# Patient Record
Sex: Male | Born: 1950
Health system: Southern US, Community
[De-identification: ages and names within clinical notes are randomized; demographics above are authoritative.]

## PROBLEM LIST (undated history)

## (undated) DIAGNOSIS — I639 Cerebral infarction, unspecified: Secondary | ICD-10-CM

## (undated) DIAGNOSIS — D649 Anemia, unspecified: Secondary | ICD-10-CM

## (undated) DIAGNOSIS — IMO0001 Reserved for inherently not codable concepts without codable children: Secondary | ICD-10-CM

## (undated) DIAGNOSIS — Z8679 Personal history of other diseases of the circulatory system: Secondary | ICD-10-CM

## (undated) DIAGNOSIS — Z0389 Encounter for observation for other suspected diseases and conditions ruled out: Secondary | ICD-10-CM

## (undated) DIAGNOSIS — E785 Hyperlipidemia, unspecified: Secondary | ICD-10-CM

## (undated) DIAGNOSIS — Z952 Presence of prosthetic heart valve: Secondary | ICD-10-CM

## (undated) DIAGNOSIS — I10 Essential (primary) hypertension: Secondary | ICD-10-CM

## (undated) DIAGNOSIS — I482 Chronic atrial fibrillation, unspecified: Secondary | ICD-10-CM

## (undated) DIAGNOSIS — M1990 Unspecified osteoarthritis, unspecified site: Secondary | ICD-10-CM

## (undated) DIAGNOSIS — I872 Venous insufficiency (chronic) (peripheral): Secondary | ICD-10-CM

## (undated) DIAGNOSIS — G4730 Sleep apnea, unspecified: Secondary | ICD-10-CM

## (undated) DIAGNOSIS — I219 Acute myocardial infarction, unspecified: Secondary | ICD-10-CM

## (undated) DIAGNOSIS — R7303 Prediabetes: Secondary | ICD-10-CM

## (undated) DIAGNOSIS — I4901 Ventricular fibrillation: Secondary | ICD-10-CM

## (undated) DIAGNOSIS — I5032 Chronic diastolic (congestive) heart failure: Secondary | ICD-10-CM

## (undated) HISTORY — DX: Morbid (severe) obesity due to excess calories: E66.01

## (undated) HISTORY — DX: Reserved for inherently not codable concepts without codable children: IMO0001

## (undated) HISTORY — DX: Essential (primary) hypertension: I10

## (undated) HISTORY — DX: Hyperlipidemia, unspecified: E78.5

## (undated) HISTORY — DX: Sleep apnea, unspecified: G47.30

## (undated) HISTORY — DX: Unspecified osteoarthritis, unspecified site: M19.90

## (undated) HISTORY — DX: Venous insufficiency (chronic) (peripheral): I87.2

## (undated) HISTORY — DX: Acute myocardial infarction, unspecified: I21.9

## (undated) HISTORY — DX: Chronic diastolic (congestive) heart failure: I50.32

## (undated) HISTORY — PX: TOTAL HIP ARTHROPLASTY: SHX124

## (undated) HISTORY — DX: Encounter for observation for other suspected diseases and conditions ruled out: Z03.89

## (undated) HISTORY — DX: Chronic atrial fibrillation, unspecified: I48.20

---

## 2001-03-07 ENCOUNTER — Encounter (HOSPITAL_COMMUNITY): Admission: RE | Admit: 2001-03-07 | Discharge: 2001-04-06 | Payer: Self-pay | Admitting: Internal Medicine

## 2003-08-30 ENCOUNTER — Ambulatory Visit (HOSPITAL_COMMUNITY): Admission: RE | Admit: 2003-08-30 | Discharge: 2003-08-30 | Payer: Self-pay | Admitting: Family Medicine

## 2003-08-30 ENCOUNTER — Encounter: Payer: Self-pay | Admitting: Family Medicine

## 2004-05-01 ENCOUNTER — Ambulatory Visit (HOSPITAL_COMMUNITY): Admission: RE | Admit: 2004-05-01 | Discharge: 2004-05-01 | Payer: Self-pay | Admitting: Internal Medicine

## 2004-07-09 ENCOUNTER — Encounter (HOSPITAL_COMMUNITY): Admission: RE | Admit: 2004-07-09 | Discharge: 2004-08-08 | Payer: Self-pay | Admitting: Internal Medicine

## 2004-08-10 ENCOUNTER — Encounter (HOSPITAL_COMMUNITY): Admission: RE | Admit: 2004-08-10 | Discharge: 2004-08-21 | Payer: Self-pay | Admitting: Orthopedic Surgery

## 2004-08-24 ENCOUNTER — Encounter (HOSPITAL_COMMUNITY): Admission: RE | Admit: 2004-08-24 | Discharge: 2004-09-23 | Payer: Self-pay | Admitting: Orthopedic Surgery

## 2004-11-04 ENCOUNTER — Ambulatory Visit: Payer: Self-pay | Admitting: Orthopedic Surgery

## 2005-03-12 ENCOUNTER — Ambulatory Visit (HOSPITAL_COMMUNITY): Admission: RE | Admit: 2005-03-12 | Discharge: 2005-03-12 | Payer: Self-pay | Admitting: Internal Medicine

## 2005-05-12 ENCOUNTER — Ambulatory Visit: Payer: Self-pay | Admitting: Orthopedic Surgery

## 2005-08-24 ENCOUNTER — Ambulatory Visit (HOSPITAL_COMMUNITY): Admission: RE | Admit: 2005-08-24 | Discharge: 2005-08-24 | Payer: Self-pay | Admitting: Internal Medicine

## 2005-09-02 ENCOUNTER — Ambulatory Visit: Payer: Self-pay | Admitting: Internal Medicine

## 2006-02-07 ENCOUNTER — Ambulatory Visit: Payer: Self-pay | Admitting: Orthopedic Surgery

## 2006-03-08 ENCOUNTER — Ambulatory Visit (HOSPITAL_COMMUNITY): Admission: RE | Admit: 2006-03-08 | Discharge: 2006-03-08 | Payer: Self-pay | Admitting: Family Medicine

## 2006-03-12 ENCOUNTER — Inpatient Hospital Stay (HOSPITAL_COMMUNITY): Admission: EM | Admit: 2006-03-12 | Discharge: 2006-03-20 | Payer: Self-pay | Admitting: Emergency Medicine

## 2006-03-12 ENCOUNTER — Encounter: Payer: Self-pay | Admitting: Emergency Medicine

## 2006-03-15 ENCOUNTER — Encounter (INDEPENDENT_AMBULATORY_CARE_PROVIDER_SITE_OTHER): Payer: Self-pay | Admitting: Cardiovascular Disease

## 2006-04-07 ENCOUNTER — Ambulatory Visit: Payer: Self-pay | Admitting: Orthopedic Surgery

## 2006-07-04 ENCOUNTER — Ambulatory Visit: Payer: Self-pay | Admitting: Orthopedic Surgery

## 2006-08-30 ENCOUNTER — Inpatient Hospital Stay (HOSPITAL_COMMUNITY): Admission: AD | Admit: 2006-08-30 | Discharge: 2006-09-05 | Payer: Self-pay | Admitting: Cardiovascular Disease

## 2006-08-31 HISTORY — PX: JOINT REPLACEMENT: SHX530

## 2008-03-14 ENCOUNTER — Ambulatory Visit (HOSPITAL_COMMUNITY): Admission: RE | Admit: 2008-03-14 | Discharge: 2008-03-14 | Payer: Self-pay | Admitting: Family Medicine

## 2008-06-13 ENCOUNTER — Telehealth (INDEPENDENT_AMBULATORY_CARE_PROVIDER_SITE_OTHER): Payer: Self-pay | Admitting: *Deleted

## 2008-06-18 DIAGNOSIS — G4733 Obstructive sleep apnea (adult) (pediatric): Secondary | ICD-10-CM

## 2008-06-19 ENCOUNTER — Ambulatory Visit: Payer: Self-pay | Admitting: Internal Medicine

## 2008-06-25 DIAGNOSIS — I214 Non-ST elevation (NSTEMI) myocardial infarction: Secondary | ICD-10-CM

## 2008-09-22 HISTORY — PX: GASTRIC BYPASS: SHX52

## 2011-04-09 NOTE — Op Note (Signed)
Joel Rogers, Joel Rogers               ACCOUNT NO.:  [id number]   MEDICAL RECORD NO.:  [id number]          PATIENT TYPE:  INP   LOCATION:  2004                         FACILITY:  [facility]   PHYSICIAN:  Ollen Gross, M.D.    DATE OF BIRTH:  03/18/1951   DATE OF PROCEDURE:  08/31/2006  DATE OF DISCHARGE:                                 OPERATIVE REPORT   PREOP DIAGNOSIS:  Osteoarthritis, right hip.   POSTOPERATIVE DIAGNOSIS:  Osteoarthritis, right hip.   PROCEDURE:  Right total hip arthroplasty.   SURGEON:  Ollen Gross, M.D.   ASSISTANT:  Avel Peace.   ANESTHESIA:  General.   ESTIMATED BLOOD LOSS:  300.   DRAINS:  None.   COMPLICATIONS:  None.   CONDITION:  Stable to recovery room.   CLINICAL NOTE:  Joel Rogers is a 60 year old male with severe end-stage arthritis  of the hip and severe morbid obesity.  He presents now for right total hip  arthroplasty.   PROCEDURE IN DETAIL:  After the successful administration of a general  anesthetic, the patient was placed in the left lateral decubitus position  with the right side up and held with the hip positioner.  The right lower  extremity from his perineum with plastic drapes and prepped and draped in  the usual sterile fashion.  Standard posterolateral incision was made with a  10 blade through subcutaneous tissue to the level of the fascia lata which  is incised in line with the skin incision.  The sciatic nerve was palpated  and protected and the short external rotators isolated off the femur.  A  capsulectomy is performed and the hip is dislocated.  The center of the  femoral head is marked and a trial prosthesis placed such that the center of  the trial head corresponds to the center of his native femoral head.  An  osteotomy line was marked on the femoral neck and osteotomy made with an  oscillating saw.  The femoral head is removed and the femur retracted  anteriorly to gain acetabular exposure.   Acetabular retractors were  placed and the labrum and osteophytes removed.  Reaming starts at 45 mm coursing in increments of 2 up to 55 mm and then a  56 mm pinnacle acetabular shell was placed in anatomic position and  transfixed with two dome screws.  The apex hole eliminator is placed and the  40 mm neutral Ultramet liner is placed.  This is a metal-on-metal hip  replacement.   On the femoral side the femoral preparation is initiated with the canal  finder and irrigation.  Axial reaming is performed up to 13.5 mm proximal  reaming to an 18 F and the sleeve machined to a large.  An 53 F large trial  sleeve is placed with an 18 x 13 stem and a 36 +8 neck.  We went 10 degrees  beyond his native anteversion.  A 40 +0 trial head is placed and the hip is  reduced with great stability.  There was full extension, full external  rotation 70 degrees flexion, 40 degrees adduction and 90  degrees internal  rotation and 90 degrees of flexion and 70 degrees internal rotation.  By  placing the right leg on top of the left it felt as though leg lengths were  equal.  Please note that the trial was an 18 x 13 36 +12 neck.  The femoral  trials were removed and then the permanent 18 F large sleeve is placed and  an 18 x 13 stem and a 36 +8 neck again about 10 degrees beyond his native  anteversion.  The 40 +0 head is placed and the hip was reduced with the same  stability parameters.   The wound is then copiously irrigated with saline solution and the short  rotators reattached to the femur through drill holes.  The fascia lata was  closed over a Hemovac drain with interrupted #1 Vicryl.  Subcu is then  closed with #1 in multiple layers and 2-0 Vicryl.  Subcuticular was closed  with a running 4-0 Monocryl.  The incision is cleaned and dried and Steri-  Strips and a bulky sterile dressing applied.  He is then placed into a knee  immobilizer, awakened and transferred to recovery in stable condition.      Ollen Gross, M.D.   Electronically Signed     FA/MEDQ  D:  08/31/2006  T:  09/02/2006  Job:  [id number]

## 2011-04-09 NOTE — Cardiovascular Report (Signed)
NAMEREINER, LOEWEN NO.:  [id number]   MEDICAL RECORD NO.:  [id number]           PATIENT TYPE:   LOCATION:                                 FACILITY:   PHYSICIAN:  Madaline Savage, M.D.     DATE OF BIRTH:   DATE OF PROCEDURE:  03/14/2006  DATE OF DISCHARGE:                              CARDIAC CATHETERIZATION   PROCEDURES PERFORMED:  1.  Selective coronary angiography by Judkins technique.  2.  Retrograde left heart catheterization.  3.  Left ventricular angiography.  4.  Right percutaneous femoral AngioSeal closure of the right femoral      artery.   COMPLICATIONS:  None.   ENTRY SITE:  Right femoral.   DYE USED:  Omnipaque.   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  In the [facility] Emergency Room he was noted to be in atrial fibrillation.  He  reports that he has lost 40 pounds of weight since December of 2006.  During  his hospitalization here at [facility] he has had positive cardiac  enzymes showing a CK peak of 309, a CK-MB peak of 18.4, a troponin peak of  3.98.  He is known to have hypertension, hyperlipidemia, obstructive sleep  apnea, and morbid obesity.  He has had a history of bradycardias at night.  Today he enters the catheterization laboratory for further evaluation of his  chest tightness and of his abnormal cardiac enzymes.  This case was  completed without any complications from the right percutaneous femoral  approach.   RESULTS:   PRESSURES:  Central aortic pressure 125/90, mean of 110.  LV pressure  130/14, end-diastolic pressure 25.  No significant gradient by pullback  technique.   ANGIOGRAPHIC RESULTS:  The patient did not show any evidence of valvular,  pericardial, or coronary calcifications during the test.   Coronary angiography was  normal except for some mild luminal irregularities  in one obtuse marginal branch well below 30%.  Circumflex and right coronary  arteries were codominant.  LV EF was estimated at 80%.  There was  obliteration of the apical LV due to marked hypertrophy of the papillary  muscles.  There was no significant regurgitation or LV thrombus seen, also  no prolapse.   FINAL DIAGNOSIS:  1.  Angiographically patent coronary arteries with a codominant system.  2.  Supernormal left ventricular systolic function, ejection fraction 80%      with in-cavity obliteration of the left ventricular at end-systole.  3.  No evidence of aortic valve gradient.  4.  Successful right percutaneous femoral artery AngioSeal.   PLAN:  The patient needs some medication changes and those will be  initiated.  He will be a candidate for discharge after he becomes  coumadinized for his atrial fibrillation.           ______________________________  Madaline Savage, M.D.     WHG/MEDQ  D:  03/14/2006  T:  03/15/2006  Job:  [id number]   cc:   Darlin Priestly, MD  Fax: [phone]

## 2011-04-09 NOTE — Discharge Summary (Signed)
Joel Rogers, Joel Rogers               ACCOUNT NO.:  [id number]   MEDICAL RECORD NO.:  [id number]          PATIENT TYPE:  INP   LOCATION:  5038                         FACILITY:  [facility]   PHYSICIAN:  Alexzandrew L. Perkins, P.A.DATE OF BIRTH:  Apr 14, 1951   DATE OF ADMISSION:  08/30/2006  DATE OF DISCHARGE:  09/05/2006                                 DISCHARGE SUMMARY   ADMISSION DIAGNOSIS:  1. Osteoarthritis right hip.  2. Atrial fibrillation.  3. Obesity.  4. Sleep apnea.  5. Hypertension.  6. Dyslipidemia.   DISCHARGE DIAGNOSIS:  1. Osteoarthritis right hip status post right total hip arthroplasty.  2. Postoperative hypokalemia, improved.  Remaining discharge diagnoses, same as admitting diagnoses.   PROCEDURE:  August 31, 2006 right total hip.  Surgeon Dr. Lequita Halt,  assistant Alexzandrew Julien Girt [facility], anesthesia general.  Consult:  [facility], Dr. Allyson Sabal.   BRIEF HISTORY:  Joel Rogers is a 60 year old male with severe end stage arthritis  of the hip, severe morbid obesity, now presents for total arthroplasty.   LABORATORY DATA:  CBC on admission:  Hemoglobin 13.3, hematocrit 39.5, white  cell count 8.8, serial CBC's were followed.  Hemoglobin did drop down to  12.3 post op, back last noted h and h was 12.4 and 36.2.  PT/INR on  admission:  Pro-time elevated at 17, PTT of 31, INR of 1.3, a Heparin level  of 0.08.  Follow up PT/INR pre op:  15.4 and 1.2.  Patient was placed on  Coumadin post op.  Serial pro-time INR's are followed.  Last noted PT/INR  23.2 and 2.0.  Chem panel on admission all in the normal limits.  Serial B-  met's were followed.  Potassium did drop from 4.3 down to 3.4, back to 3.7.  Pre op UA negative, with the exception of positive protein and rare  bacteria.  Blood grew type O+.   X-RAYS:  1. Portable chest August 30, 2006:  Cardiomegaly, chronic pulmonary      vascular congestion without significant change, no acute findings.  2. Hip films  on August 30, 2006:  Advanced right hip degenerative joint      disease.  3. Portable pelvis and hip August 31, 2006:  Status post right hip      replacement.  Patient's pre op EKG:  Irregular rhythm, no P-wave, S-T and T-wave  abnormality consistent with anterior septal infarct, probably old.  Outpatient echo:  Dated March 15, 2006, left ventricular size was in the  upper limits of normal, overall left ventricular systolic function was  normal, EF estimated at 55%, no diagnostic evidence of left ventricular  regional wall motion abnormalities, mild mitral anuric complication, left  atrium was moderately dilated, right atrium was mildly dilated.   HOSPITAL COURSE:  The patient was admitted to [facility] by Dr.  Nanetta Batty in cardiac services due to need for Heparinization.  Was  admitted on August 30, 2006 and started on Heparin after being off Coumadin  for his cardiac history.  The Heparin was continued until the following  morning.  The Heparin was stopped.  The patient was pre op NPO and taken to  the OR on the following day of August 31, 2006.  Patient was started on  Lovenox and Coumadin postoperatively.  Tolerated procedure well.  Was kept  on telemetry floor for postoperative monitoring.  On the morning of day #1,  he was doing very well, minimal pain, Hemovac drain, which was placed at  time of surgery, was pulled.  Hemoglobin was 13.8.  Physical therapy was  consulted.  [facility] followed along during the  hospital course.  Norvasc was changed over to Cardizem for better rate  control on day #2.  He was under better pain control.  He had been weaned  over to p.o. meds.  Dressing was changed on day #2 and the incision looked  good.  He started getting up with a little bit of therapy, was not moving  around very much so we left the Foley in another day.  From a cardiac  standpoint, he was doing very well, was on Lovenox bridging until  Coumadin  was therapeutic.  His INR was slowly coming up and improving.  It was felt  once he was stable from an orthopedic standpoint, he would be ready to go  home.  Did run a little bit of low grade temp around day 3, day 4, and was  treated with antipyretics and incentive spirometer.  Minimal pain,  tolerating well.  Continued to progress with physical therapy and by September 05, 2006 his pain had greatly improved.  INR was back up to 2.0, stayed on  his Coumadin, Lovenox was discontinued, progressing well.  We allowed to be  weight bearing as tolerated for better mobilization and he was discharged  home.   DISCHARGE PLAN:  1. Patient discharged home on September 05, 2006.  2. Discharge diagnosis, please see above.  3. Discharge meds:  Coumadin, Percocet, Robaxin.  4. Diet:  Resume previous home diet.  5. Activity:  He is weight bearing as tolerated.  Gait training,      ambulation, ADLs as per PT, RN for Coumadin blood draws and Coumadin      protocol.  6. Followup 2 weeks from surgery.   DISPOSITION:  Home.   CONDITION ON DISCHARGE:  Improved.      Alexzandrew L. Julien Girt, P.A.     ALP/MEDQ  D:  09/22/2006  T:  09/23/2006  Job:  [id number]   cc:   Nanetta Batty, M.D.  Ollen Gross, M.D.  Madelin Rear. Sherwood Gambler, MD

## 2011-04-09 NOTE — Procedures (Signed)
Joel Rogers, Joel Rogers               ACCOUNT NO.:  [id number]   MEDICAL RECORD NO.:  [id number]          PATIENT TYPE:  OUT   LOCATION:                                FACILITY:  [facility]   PHYSICIAN:  Edward L. Juanetta Gosling, M.D.DATE OF BIRTH:  Feb 26, 1951   DATE OF PROCEDURE:  DATE OF DISCHARGE:                                EKG INTERPRETATION   They rhythm is atrial fibrillation with a well-controlled ventricular  response between 80 and 90.  ST-T wave abnormalities are seen which could  indicated ischemia, and clinical correlation is suggested.  Abnormal  electrocardiogram.      Oneal Deputy. Juanetta Gosling, M.D.  Electronically Signed     ELH/MEDQ  D:  03/13/2006  T:  03/15/2006  Job:  [id number]

## 2011-04-09 NOTE — Discharge Summary (Signed)
Joel Rogers, Joel Rogers               ACCOUNT NO.:  [id number]   MEDICAL RECORD NO.:  [id number]          PATIENT TYPE:  INP   LOCATION:  3736                         FACILITY:  [facility]   PHYSICIAN:  Darlin Priestly, MD  DATE OF BIRTH:  [birth year]   DATE OF ADMISSION:  2020-04-1906  DATE OF DISCHARGE:  03/20/2006                                 DISCHARGE SUMMARY   DISCHARGE DIAGNOSES:  1.  Subendocardial myocardial infarction, suspected coronary spasm with      normal coronaries at catheterization.  2.  Atrial fibrillation, new, controlled rate at discharge.  3.  Morbid obesity.  4.  Sleep apnea.  5.  History of hypertension.  6.  History of dyslipidemia.   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  He is  followed by Dr. Sherwood Gambler.  He has recently been on an exercise program doing  some water aerobics for weight loss.  He presented March 12, 2006, with  chest tightness followed by severe weakness and diaphoresis.  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  There were some nurses present who sent  him to the ER at [facility].  He was found to be in atrial fib.  He  was transferred to [facility] for further evaluation.  He was put on IV heparin.  Subsequent enzymes were positive with a CK peak  of 309 and 18 MBs.  He underwent diagnostic catheterization March 14, 2006,  which surprisingly showed normal coronaries with an EF of 80%.  Suspected he  may have had coronary spasm, although interestingly he was on Norvasc 10 mg  a day prior to admission.  Echocardiogram was done on March 15, 2006.  This  revealed normal LV function.  The left atrium was moderately dilated.  The  right atrium was mildly dilated.  We were going to send him home on Lovenox  and Coumadin crossover, but the patient has some financial issues and could  not afford Lovenox and apparently does  not have insurance that will cover  it.  We kept him in the hospital until his INR was 2.2 on the March 20, 2006.   He will be discharged on the following medications.  1.  Lipitor 40 mg a day.  2.  Torsemide 20 mg a day.  3.  Aspirin 81 mg a day.  4.  Avapro 150 mg a day.  5.  Metoprolol 50 mg twice a day.  6.  Coumadin 10 mg a day or as directed.  7.  Norvasc 2.5 mg once a day.  8.  Nitroglycerin sublingual p.r.n.   LABS AT DISCHARGE:  His INR is 2.2.  White count 11.2, hemoglobin 15.4,  hematocrit 45.2, platelets 263.  Most recent renal function from the March 15, 2006, was sodium 140, potassium 3.5, BUN 7, creatinine 1.1.  CKs peaked  as noted 309 with 18 MBs.  Hemoglobin A1c was 6.  Lipid study shows  cholesterol 179, LDL 108.  TSH  0.80.   Chest x-ray shows cardiomegaly and chronic lung markings.   EKG shows atrial fibrillation with controlled ventricular response and T-  wave inversion diffusely.   DISPOSITION:  The patient is discharged stable in condition.  He will follow-  up with Dr. Allyson Sabal as an outpatient.  We did cut his Norvasc back because of  hypotension.  Dr. Jacinto Halim feels he can be cardioverted in two weeks as an  outpatient and not require TEE if he has been adequately anticoagulated.  He  will have a protime Wednesday next week.      Abelino Derrick, P.A.      Darlin Priestly, MD  Electronically Signed    LKK/MEDQ  D:  03/20/2006  T:  03/21/2006  Job:  [id number]

## 2012-02-07 ENCOUNTER — Other Ambulatory Visit (HOSPITAL_COMMUNITY): Payer: Self-pay | Admitting: Orthopedic Surgery

## 2012-02-07 DIAGNOSIS — M25559 Pain in unspecified hip: Secondary | ICD-10-CM

## 2012-02-09 ENCOUNTER — Ambulatory Visit (HOSPITAL_COMMUNITY)
Admission: RE | Admit: 2012-02-09 | Discharge: 2012-02-09 | Disposition: A | Discharge status: Home / Self Care | Payer: Medicare Other | Source: Ambulatory Visit | Attending: Orthopedic Surgery | Admitting: Orthopedic Surgery

## 2012-02-09 DIAGNOSIS — M79609 Pain in unspecified limb: Secondary | ICD-10-CM | POA: Insufficient documentation

## 2012-02-09 DIAGNOSIS — M25559 Pain in unspecified hip: Secondary | ICD-10-CM | POA: Insufficient documentation

## 2012-02-09 DIAGNOSIS — Z96649 Presence of unspecified artificial hip joint: Secondary | ICD-10-CM | POA: Insufficient documentation

## 2012-05-23 ENCOUNTER — Emergency Department (HOSPITAL_COMMUNITY): Payer: Medicare Other

## 2012-05-23 ENCOUNTER — Emergency Department (HOSPITAL_COMMUNITY)
Admission: EM | Admit: 2012-05-23 | Discharge: 2012-05-23 | Disposition: A | Discharge status: Home / Self Care | Payer: Medicare Other | Attending: Emergency Medicine | Admitting: Emergency Medicine

## 2012-05-23 ENCOUNTER — Encounter (HOSPITAL_COMMUNITY): Payer: Self-pay | Admitting: *Deleted

## 2012-05-23 DIAGNOSIS — R110 Nausea: Secondary | ICD-10-CM | POA: Insufficient documentation

## 2012-05-23 DIAGNOSIS — I4891 Unspecified atrial fibrillation: Secondary | ICD-10-CM | POA: Insufficient documentation

## 2012-05-23 DIAGNOSIS — Z79899 Other long term (current) drug therapy: Secondary | ICD-10-CM | POA: Insufficient documentation

## 2012-05-23 DIAGNOSIS — R109 Unspecified abdominal pain: Secondary | ICD-10-CM | POA: Insufficient documentation

## 2012-05-23 DIAGNOSIS — Z7901 Long term (current) use of anticoagulants: Secondary | ICD-10-CM | POA: Insufficient documentation

## 2012-05-23 LAB — COMPREHENSIVE METABOLIC PANEL
ALT: 31 U/L (ref 0–53)
AST: 43 U/L — ABNORMAL HIGH (ref 0–37)
Albumin: 3.7 g/dL (ref 3.5–5.2)
Alkaline Phosphatase: 95 U/L (ref 39–117)
GFR calc Af Amer: >90 mL/min (ref >90)
Glucose, Bld: 124 mg/dL — ABNORMAL HIGH (ref 70–99)
Potassium: 3.4 mEq/L — ABNORMAL LOW (ref 3.5–5.1)
Sodium: 136 mEq/L (ref 135–145)
Total Protein: 7.1 g/dL (ref 6.0–8.3)

## 2012-05-23 LAB — URINALYSIS, ROUTINE W REFLEX MICROSCOPIC
Bilirubin Urine: NEGATIVE
Glucose, UA: NEGATIVE mg/dL
Ketones, ur: NEGATIVE mg/dL
pH: 6 (ref 5.0–8.0)

## 2012-05-23 LAB — CBC WITH DIFFERENTIAL/PLATELET
Eosinophils Absolute: 0.1 10*3/uL (ref 0.0–0.7)
Lymphs Abs: 1.8 10*3/uL (ref 0.7–4.0)
MCH: 28.3 pg (ref 26.0–34.0)
Neutrophils Relative %: 79 % — ABNORMAL HIGH (ref 43–77)
Platelets: 211 10*3/uL (ref 150–400)
RBC: 5.13 MIL/uL (ref 4.22–5.81)
WBC: 13.1 10*3/uL — ABNORMAL HIGH (ref 4.0–10.5)

## 2012-05-23 LAB — URINE MICROSCOPIC-ADD ON

## 2012-05-23 MED ORDER — HYOSCYAMINE SULFATE 0.125 MG PO TABS
0.1250 mg | ORAL_TABLET | Freq: Once | ORAL | Status: AC
  Administered 2012-05-23: 0.125 mg via ORAL
  Filled 2012-05-23: qty 1

## 2012-05-23 MED ORDER — GI COCKTAIL ~~LOC~~
30.0000 mL | Freq: Once | ORAL | Status: AC
  Administered 2012-05-23: 30 mL via ORAL
  Filled 2012-05-23: qty 30

## 2012-05-23 MED ORDER — HYOSCYAMINE SULFATE 0.125 MG SL SUBL
SUBLINGUAL_TABLET | SUBLINGUAL | Status: AC
  Filled 2012-05-23: qty 1

## 2012-05-23 NOTE — ED Notes (Signed)
Taking sips of water w/out N/V or abd. Distress.

## 2012-05-23 NOTE — ED Notes (Signed)
Pt states abdominal pain after eating peppers. Denies vomiting. Last BM was at 1630.

## 2012-05-23 NOTE — ED Provider Notes (Signed)
History     CSN: [id number]  Arrival date & time 05/23/12  1843   First MD Initiated Contact with Patient 05/23/12 1905      Chief Complaint  Patient presents with  . Abdominal Pain    (Consider location/radiation/quality/duration/timing/severity/associated sxs/prior treatment) HPI Comments: Joel Rogers is a 61 y.o. Male who ate salad 2:30 PM today, then, at 4:30 PM, developed lower abdominal pain, followed by needing to have a bowel movement. The bowel movement was semi-formed. It was brown in color, without bleeding,  He had nausea without vomiting. He denies preceding illness, such as fever, chills, cough, shortness of breath, chest or back pain. The changes in his urinary habits. He does not have ongoing, bowel abnormalities. He typically gets similar pain when he eats hot peppers, and they were in his salad today. He does not know of any palliative factors. He has not tried to take anything for the problem.  Patient is a 61 y.o. male presenting with abdominal pain. The history is provided by the patient.  Abdominal Pain The primary symptoms of the illness include abdominal pain.    Past Medical History  Diagnosis Date  . Atrial fibrillation     Past Surgical History  Procedure Date  . Gastric bypass     No family history on file.  History  Substance Use Topics  . Smoking status: Never Smoker   . Smokeless tobacco: Not on file  . Alcohol Use: No      Review of Systems  Gastrointestinal: Positive for abdominal pain.  All other systems reviewed and are negative.    Allergies  Review of patient's allergies indicates no known allergies.  Home Medications   Current Outpatient Rx  Name Route Sig Dispense Refill  . AMLODIPINE BESYLATE 10 MG PO TABS Oral Take 10 mg by mouth daily.    . ATORVASTATIN CALCIUM 80 MG PO TABS Oral Take 80 mg by mouth at bedtime.    Marland Kitchen CALCIUM + D PO Oral Take 1 tablet by mouth daily.    Marland Kitchen LOSARTAN POTASSIUM 50 MG PO TABS Oral Take  50 mg by mouth daily.    Marland Kitchen METOPROLOL TARTRATE 100 MG PO TABS Oral Take 100 mg by mouth 2 (two) times daily.    . TORSEMIDE 20 MG PO TABS Oral Take 20 mg by mouth at bedtime.     . WARFARIN SODIUM 5 MG PO TABS Oral Take 5 mg by mouth every evening.       BP 136/77  Pulse 79  Temp 98.9 F (37.2 C) (Oral)  Resp 20  Ht 6' (1.829 m)  Wt 277 lb (125.646 kg)  BMI 37.57 kg/m2  SpO2 95%  Physical Exam  Nursing note and vitals reviewed. Constitutional: He is oriented to person, place, and time. He appears well-developed and well-nourished.  HENT:  Head: Normocephalic and atraumatic.  Right Ear: External ear normal.  Left Ear: External ear normal.  Eyes: Conjunctivae and EOM are normal. Pupils are equal, round, and reactive to light.  Neck: Normal range of motion and phonation normal. Neck supple.  Cardiovascular: Normal rate, regular rhythm, normal heart sounds and intact distal pulses.   Pulmonary/Chest: Effort normal and breath sounds normal. He exhibits no bony tenderness.  Abdominal: Soft. Normal appearance. He exhibits no distension. There is tenderness (mild bilateral lower quadrants).       Hypoactive bowel sounds.  Genitourinary:       Normal anal sphincter tone. Brown stool in rectum, and is  Hemoccult negative; quality control  testing is normal  Musculoskeletal: Normal range of motion.  Neurological: He is alert and oriented to person, place, and time. He has normal strength. No cranial nerve deficit or sensory deficit. He exhibits normal muscle tone. Coordination normal.  Skin: Skin is warm, dry and intact.  Psychiatric: He has a normal mood and affect. His behavior is normal. Judgment and thought content normal.    ED Course  Procedures (including critical care time) Emergency department treatment: GI cocktail, and Levsin.  Reevaluation: 21:35- he feels better now, is passing gas, and states that he has just a vague soreness in his body that he feels is related to working  out with weights yesterday.  Oral fluid and trial: Tolerating oral fluid (22:00)  Repeat vital signs are normal (22:06)  Tylenol given prior to discharge for abdominal pain (3/10)  Labs Reviewed  CBC WITH DIFFERENTIAL - Abnormal; Notable for the following:    WBC 13.1 (*)     Neutrophils Relative 79 (*)     Neutro Abs 10.4 (*)     All other components within normal limits  COMPREHENSIVE METABOLIC PANEL - Abnormal; Notable for the following:    Potassium 3.4 (*)     Glucose, Bld 124 (*)     AST 43 (*)     All other components within normal limits  URINALYSIS, ROUTINE W REFLEX MICROSCOPIC - Abnormal; Notable for the following:    Hgb urine dipstick SMALL (*)     Protein, ur 100 (*)     All other components within normal limits  URINE MICROSCOPIC-ADD ON  LAB REPORT - SCANNED   Dg Abd Acute W/chest  05/23/2012  *RADIOLOGY REPORT*  Clinical Data: Severe abdominal pain.  ACUTE ABDOMEN SERIES (ABDOMEN 2 VIEW & CHEST 1 VIEW)  Comparison: Chest x-ray 08/30/2006.  Findings: Cardiomegaly.  Lungs are clear.  No effusions or edema. No acute bony abnormality.  Surgical clips in the left abdomen.  No evidence of bowel obstruction or free air.  No organomegaly or suspicious calcification.  Changes of right hip replacement.  IMPRESSION: No evidence of bowel obstruction or free air.  Cardiomegaly.  Original Report Authenticated By: Cyndie Chime, M.D.     1. Abdominal pain, acute       MDM  Nonspecific abdominal pain, with reassuring evaluation. Doubt UTI, SBO, colitis, metabolic instability, or serious bacterial infection. Differential diagnosis includes early gastroenteritis.    Plan: Home Medications- Maalox or Pepto-Bismol; Home Treatments- gradually advance diet; Recommended follow up- PCP or here prn       Flint Melter, MD 05/25/12 [phone]

## 2012-05-23 NOTE — ED Notes (Signed)
Pt. States at about 1530, he ate a salad w/jalapenos and crushed red pepper.  This usually gives him mild heatburn, but this time he developed severe, burning pain in his mid to lower abdomen.  He had a BM at about 1630, which he described as both loose and formed.  He has not felt gaseous.  He did not treat w/any OTC heartburn medications or any home remedies.

## 2013-02-14 ENCOUNTER — Encounter: Payer: Self-pay | Admitting: Pharmacist Clinician (PhC)/ Clinical Pharmacy Specialist

## 2013-02-14 DIAGNOSIS — I4891 Unspecified atrial fibrillation: Secondary | ICD-10-CM

## 2013-02-14 DIAGNOSIS — I482 Chronic atrial fibrillation, unspecified: Secondary | ICD-10-CM | POA: Insufficient documentation

## 2013-02-14 DIAGNOSIS — Z7901 Long term (current) use of anticoagulants: Secondary | ICD-10-CM | POA: Insufficient documentation

## 2013-03-28 ENCOUNTER — Encounter: Payer: Self-pay | Admitting: Cardiovascular Disease

## 2013-04-12 ENCOUNTER — Ambulatory Visit: Payer: Medicare Other | Admitting: Cardiovascular Disease

## 2013-04-13 ENCOUNTER — Telehealth: Payer: Self-pay | Admitting: *Deleted

## 2013-04-13 ENCOUNTER — Ambulatory Visit (INDEPENDENT_AMBULATORY_CARE_PROVIDER_SITE_OTHER): Payer: Medicare Other | Admitting: Cardiovascular Disease

## 2013-04-13 ENCOUNTER — Encounter: Payer: Self-pay | Admitting: Cardiovascular Disease

## 2013-04-13 VITALS — BP 90/70 | HR 58 | Ht 72.0 in | Wt 281.0 lb

## 2013-04-13 DIAGNOSIS — I10 Essential (primary) hypertension: Secondary | ICD-10-CM | POA: Insufficient documentation

## 2013-04-13 DIAGNOSIS — I4891 Unspecified atrial fibrillation: Secondary | ICD-10-CM

## 2013-04-13 DIAGNOSIS — R0989 Other specified symptoms and signs involving the circulatory and respiratory systems: Secondary | ICD-10-CM

## 2013-04-13 DIAGNOSIS — E785 Hyperlipidemia, unspecified: Secondary | ICD-10-CM | POA: Insufficient documentation

## 2013-04-13 DIAGNOSIS — I482 Chronic atrial fibrillation, unspecified: Secondary | ICD-10-CM

## 2013-04-13 MED ORDER — IRBESARTAN 150 MG PO TABS: 75.0000 mg | ORAL_TABLET | Freq: Every day | ORAL | Status: DC

## 2013-04-13 MED ORDER — LOSARTAN POTASSIUM 100 MG PO TABS: 100.0000 mg | ORAL_TABLET | Freq: Every day | ORAL | Status: DC

## 2013-04-13 NOTE — Telephone Encounter (Signed)
Patient informed and verbalized understanding of plan. 

## 2013-04-13 NOTE — Patient Instructions (Addendum)
Your physician recommends that you schedule a follow-up appointment in: 1 year at the [place] office. You will receive a reminder letter in the mail in about 10 months reminding you to call and schedule your appointment. If you don't receive this letter, please contact our office. Your physician has recommended you make the following change in your medication: Decrease your avapro to 75 mg daily. Please break your tablet in 1/2 daily. All other medications will remain the same. Your new prescription has been sent to your pharmacy. Your physician has requested that you have a carotid duplex. This test is an ultrasound of the carotid arteries in your neck. It looks at blood flow through these arteries that supply the brain with blood. Allow one hour for this exam. There are no restrictions or special instructions.

## 2013-04-13 NOTE — Progress Notes (Signed)
04/13/2013 Joel Rogers   1951-10-21  [id number]  Primary Physician Kirk Ruths, MD Primary Cardiologist: Runell Gess MD [name]   HPI:  The patient is a very pleasant 62 year old, severely overweight, married, African American male, father of 2, grandfather to 4 grandchildren who I last saw 6 months ago. He has a history of chronic atrial fibrillation, rate controlled on Coumadin anticoagulation. He has obstructive sleep apnea on CPAP, hypertension, and hyperlipidemia. He was catheterized by Dr. Lavonne Chick in 2007 and found to have normal coronary arteries and normal coronary function. He had laparoscopic Roux-en-Y gastric bypass surgery in November of 2009 at [facility] and has lost over 140 pounds. He feels clinically improved. His last lipid profile performed a year ago was excellent with an LDL of 82, HDL of 52, total cholesterol 142. He complains of right greater than left lower extremity swelling, which has been chronic. He has some venous reflux in his lesser saphenous vein, probably not amenable to endovenous ablation. Since his last visit 2/13, he denies CP/SOB.     Current Outpatient Prescriptions  Medication Sig Dispense Refill  . amLODipine (NORVASC) 10 MG tablet Take 10 mg by mouth daily.      Marland Kitchen atorvastatin (LIPITOR) 80 MG tablet Take 80 mg by mouth at bedtime.      . Calcium Carbonate-Vitamin D (CALCIUM + D PO) Take 1 tablet by mouth daily.      . irbesartan (AVAPRO) 150 MG tablet Take 0.5 tablets (75 mg total) by mouth at bedtime.  15 tablet  11  . losartan (COZAAR) 50 MG tablet Take 50 mg by mouth daily.      . metoprolol succinate (TOPROL-XL) 100 MG 24 hr tablet Take 100 mg by mouth daily. Take with or immediately following a meal.      . torsemide (DEMADEX) 20 MG tablet Take 20 mg by mouth at bedtime.       Marland Kitchen warfarin (COUMADIN) 5 MG tablet Take 5 mg by mouth every evening.        No current facility-administered medications for this visit.    No  Known Allergies  History   Social History  . Marital Status: Married    Spouse Name: N/A    Number of Children: 2  . Years of Education: N/A   Occupational History  . Not on file.   Social History Main Topics  . Smoking status: Never Smoker   . Smokeless tobacco: Not on file  . Alcohol Use: No  . Drug Use: No  . Sexually Active:    Other Topics Concern  . Not on file   Social History Narrative  . No narrative on file     Review of Systems: General: negative for chills, fever, night sweats or weight changes.  Cardiovascular: negative for chest pain, dyspnea on exertion, edema, orthopnea, palpitations, paroxysmal nocturnal dyspnea or shortness of breath Dermatological: negative for rash Respiratory: negative for cough or wheezing Urologic: negative for hematuria Abdominal: negative for nausea, vomiting, diarrhea, bright red blood per rectum, melena, or hematemesis Neurologic: negative for visual changes, syncope, or dizziness All other systems reviewed and are otherwise negative except as noted above.    Blood pressure 90/70, pulse 58, height 6' (1.829 m), weight 281 lb (127.461 kg).  General appearance: alert and no distress Neck: no adenopathy, no JVD, supple, symmetrical, trachea midline, thyroid not enlarged, symmetric, no tenderness/mass/nodules and Soft bruits bilaterally L>R which are new Lungs: clear to auscultation bilaterally Heart: irregularly irregular rhythm  Extremities: venous stasis dermatitis noted and 2+edema right, 1+ left  EKG AFIB with slow VR and repol changes unchange from the prior EKG  ASSESSMENT AND PLAN:   OBSTRUCTIVE SLEEP APNEA On CPAP  Atrial fibrillation No change. Rate controlled. We follow his INR  Hyperlipemia Followed by his PCP      Runell Gess MD [facility], [facility] 04/13/2013 11:06 AM

## 2013-04-13 NOTE — Telephone Encounter (Signed)
Received call from pharmacy stating they received a prescription from our office for dose change in avapro. Pharmacy said patient has never filled this from their store and that patient has been on losartan 50 mg only. MD informed and said to stop avapro and increase losartan to 100 mg daily. Nurse called patient's home number and left message for him to call our office.

## 2013-04-13 NOTE — Telephone Encounter (Signed)
Left message with wife to have patient call office for new medication changes.

## 2013-04-13 NOTE — Assessment & Plan Note (Signed)
On CPAP. ?

## 2013-04-13 NOTE — Assessment & Plan Note (Signed)
No change. Rate controlled. We follow his INR

## 2013-04-13 NOTE — Assessment & Plan Note (Signed)
Followed by his PCP 

## 2013-04-19 ENCOUNTER — Ambulatory Visit (HOSPITAL_COMMUNITY)
Admission: RE | Admit: 2013-04-19 | Discharge: 2013-04-19 | Disposition: A | Discharge status: Home / Self Care | Payer: Medicare Other | Source: Ambulatory Visit | Attending: Cardiovascular Disease | Admitting: Cardiovascular Disease

## 2013-04-19 DIAGNOSIS — R0989 Other specified symptoms and signs involving the circulatory and respiratory systems: Secondary | ICD-10-CM | POA: Insufficient documentation

## 2013-04-19 NOTE — Progress Notes (Signed)
Carotid Duplex Completed. °Joel Rogers ° °

## 2013-04-26 ENCOUNTER — Telehealth: Payer: Self-pay | Admitting: Cardiovascular Disease

## 2013-04-26 ENCOUNTER — Other Ambulatory Visit: Payer: Self-pay | Admitting: Pharmacist Clinician (PhC)/ Clinical Pharmacy Specialist

## 2013-04-26 MED ORDER — WARFARIN SODIUM 5 MG PO TABS: ORAL_TABLET | ORAL | Status: DC

## 2013-04-26 NOTE — Telephone Encounter (Signed)
Returning [place] call from yesterday-concerning test results!

## 2013-04-27 ENCOUNTER — Telehealth: Payer: Self-pay | Admitting: Cardiovascular Disease

## 2013-04-27 NOTE — Telephone Encounter (Signed)
Spoke with patient and gave results. 

## 2013-04-27 NOTE — Telephone Encounter (Signed)
Returning your call. °

## 2013-05-01 ENCOUNTER — Other Ambulatory Visit: Payer: Self-pay | Admitting: *Deleted

## 2013-05-01 MED ORDER — WARFARIN SODIUM 5 MG PO TABS: ORAL_TABLET | ORAL | Status: DC

## 2013-05-03 ENCOUNTER — Other Ambulatory Visit: Payer: Self-pay | Admitting: Cardiovascular Disease

## 2013-05-03 LAB — PROTIME-INR: INR: 1.81 — ABNORMAL HIGH (ref <1.50)

## 2013-05-07 ENCOUNTER — Ambulatory Visit (INDEPENDENT_AMBULATORY_CARE_PROVIDER_SITE_OTHER): Payer: Self-pay | Admitting: Pharmacist Clinician (PhC)/ Clinical Pharmacy Specialist

## 2013-05-07 DIAGNOSIS — Z7901 Long term (current) use of anticoagulants: Secondary | ICD-10-CM

## 2013-05-07 DIAGNOSIS — I4891 Unspecified atrial fibrillation: Secondary | ICD-10-CM

## 2013-06-07 ENCOUNTER — Other Ambulatory Visit: Payer: Self-pay | Admitting: Pharmacist Clinician (PhC)/ Clinical Pharmacy Specialist

## 2013-06-07 MED ORDER — WARFARIN SODIUM 5 MG PO TABS: ORAL_TABLET | ORAL | Status: DC

## 2013-06-22 ENCOUNTER — Other Ambulatory Visit: Payer: Self-pay | Admitting: Cardiovascular Disease

## 2013-06-25 ENCOUNTER — Ambulatory Visit (INDEPENDENT_AMBULATORY_CARE_PROVIDER_SITE_OTHER): Payer: Self-pay | Admitting: Pharmacist

## 2013-06-25 ENCOUNTER — Telehealth: Payer: Self-pay | Admitting: Pharmacist Clinician (PhC)/ Clinical Pharmacy Specialist

## 2013-06-25 DIAGNOSIS — I4891 Unspecified atrial fibrillation: Secondary | ICD-10-CM

## 2013-06-25 DIAGNOSIS — Z7901 Long term (current) use of anticoagulants: Secondary | ICD-10-CM

## 2013-06-25 NOTE — Telephone Encounter (Signed)
Pt was returning your call.

## 2013-06-25 NOTE — Telephone Encounter (Signed)
Spoke with pt.  See anti-coag note for details.

## 2013-08-07 ENCOUNTER — Telehealth: Payer: Self-pay | Admitting: Pharmacist Clinician (PhC)/ Clinical Pharmacy Specialist

## 2013-08-07 NOTE — Telephone Encounter (Signed)
Overdue INR letter sent 

## 2013-08-16 ENCOUNTER — Other Ambulatory Visit: Payer: Self-pay | Admitting: Cardiovascular Disease

## 2013-08-16 LAB — PROTIME-INR
INR: 2.41 — ABNORMAL HIGH (ref <1.50)
Prothrombin Time: 25.3 seconds — ABNORMAL HIGH (ref 11.6–15.2)

## 2013-08-17 ENCOUNTER — Ambulatory Visit (INDEPENDENT_AMBULATORY_CARE_PROVIDER_SITE_OTHER): Payer: Medicare Other | Admitting: Pharmacist Clinician (PhC)/ Clinical Pharmacy Specialist

## 2013-08-17 DIAGNOSIS — I4891 Unspecified atrial fibrillation: Secondary | ICD-10-CM

## 2013-08-17 DIAGNOSIS — Z7901 Long term (current) use of anticoagulants: Secondary | ICD-10-CM

## 2013-08-22 ENCOUNTER — Other Ambulatory Visit: Payer: Self-pay | Admitting: Pharmacist Clinician (PhC)/ Clinical Pharmacy Specialist

## 2013-08-24 ENCOUNTER — Telehealth: Payer: Self-pay | Admitting: Pharmacist Clinician (PhC)/ Clinical Pharmacy Specialist

## 2013-08-24 DIAGNOSIS — Z7901 Long term (current) use of anticoagulants: Secondary | ICD-10-CM

## 2013-08-24 DIAGNOSIS — I4891 Unspecified atrial fibrillation: Secondary | ICD-10-CM

## 2013-08-24 NOTE — Telephone Encounter (Signed)
Standing order for INR to [facility]

## 2013-10-01 ENCOUNTER — Other Ambulatory Visit: Payer: Self-pay | Admitting: Pharmacist Clinician (PhC)/ Clinical Pharmacy Specialist

## 2013-10-02 ENCOUNTER — Ambulatory Visit (HOSPITAL_COMMUNITY)
Admission: RE | Admit: 2013-10-02 | Discharge: 2013-10-02 | Disposition: A | Discharge status: Home / Self Care | Payer: Medicare Other | Source: Ambulatory Visit | Attending: Orthopedic Surgery | Admitting: Orthopedic Surgery

## 2013-10-02 DIAGNOSIS — I10 Essential (primary) hypertension: Secondary | ICD-10-CM | POA: Insufficient documentation

## 2013-10-02 DIAGNOSIS — R29898 Other symptoms and signs involving the musculoskeletal system: Secondary | ICD-10-CM | POA: Insufficient documentation

## 2013-10-02 DIAGNOSIS — R262 Difficulty in walking, not elsewhere classified: Secondary | ICD-10-CM | POA: Insufficient documentation

## 2013-10-02 DIAGNOSIS — IMO0001 Reserved for inherently not codable concepts without codable children: Secondary | ICD-10-CM | POA: Insufficient documentation

## 2013-10-02 DIAGNOSIS — M25569 Pain in unspecified knee: Secondary | ICD-10-CM | POA: Insufficient documentation

## 2013-10-02 NOTE — Evaluation (Signed)
Physical Therapy Evaluation  Patient Details  Name: Joel Rogers MRN: [id number] Date of Birth: 1951/10/14  Today's Date: 10/02/2013 Time: 1478-2956 PT Time Calculation (min): 1425 min eval             Visit#: 1 of 8  Re-eval: 11/01/13 Assessment Diagnosis: hip pain Surgical Date: 08/31/06  Authorization: [facility] medicare      Past Medical History:  Past Medical History  Diagnosis Date  . Atrial fibrillation   . Sleep apnea     on CPAP  . Hypertension   . Hyperlipidemia   . Obesity    Past Surgical History:  Past Surgical History  Procedure Laterality Date  . Gastric bypass  09/2008  . Total hip arthroplasty      right hip    Subjective Symptoms/Limitations Symptoms: Joel Rogers states that he had a THR on his Rt hiip on 08/31/2006.  He states everything went well until 5-6 months ago and then he began having pain along the lateral aspect of his leg and has noticed swelling along the inside of his right leg. Pertinent History: MI How long can you sit comfortably?: able to sit at the most 20 -30 minutes.  If he sits for 30 minutes though he needs to hold onto something before he takes that first step which has changed. How long can you stand comfortably?: Able to stand for ten minutes and then he wants to get off his leg. How long can you walk comfortably?: Pt walks in the pool but not on land.   Special Tests: Pt states he is having to hold onto a handrail if he walks up more than two steps and at times is not able to do them reciprocal. 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 Leisure: Hobbies-yes  (Comment) Comments: [employer]     Sensation/Coordination/Flexibility/Functional Tests Functional Tests Functional Tests: FOTA 37 risk adjusted 45  Assessment RLE Strength Right Hip Flexion: 2+/5 Right Hip Extension: 3-/5 Right Hip ABduction: 2+/5 Right Hip ADduction: 3/5 Right Knee Flexion: 5/5 Right Knee Extension:  (5-/5) Right Ankle Dorsiflexion: 4/5 Right Ankle Plantar Flexion: 2+/5  Exercise/Treatments Mobility/Balance  Static Standing Balance Single Leg Stance - Right Leg: 21 Single Leg Stance - Left Leg: 27   Standing SLS:  (bent knee lift x 10)   Supine Hip Adduction Isometric: 10 reps Bridges: 10 reps Sidelying Hip ABduction: 10 reps Prone  Hamstring Curl: 10 reps    Physical Therapy Assessment and Plan PT Assessment and Plan Clinical Impression Statement: Pt referred to physical therapy for hip pain.  Pt demonstrates decreased strength and tight hip adductionl Pt will benefit from skilled therapeutic intervention in order to improve on the following deficits: Decreased activity tolerance;Decreased strength;Difficulty walking;Pain Rehab Potential: Good PT Frequency: Min 2X/week PT Duration: 4 weeks PT Treatment/Interventions: Gait training;Stair training;Therapeutic activities;Therapeutic exercise;Modalities;Manual techniques PT Plan: begin higher level strengthening,ie step ups, terminal extension, rocker board, lunges as well as hip adduction stretch    Goals Home Exercise Program Pt/caregiver will Perform Home Exercise Program: For increased strengthening PT Short Term Goals Time to Complete Short Term Goals: 2 weeks PT Short Term Goal 1: Pt to be able to sit for an hour to  be able to enjoy a meal at a restaurant PT Short Term Goal 2: Pt to be able to stand for 20 mintues to socialize PT Short Term Goal 3: Pt to be able to walk for 20 minutes on land for healthy lifestyle PT Short Term Goal 4: Pain to be no greater than a 6/10 PT Long Term Goals Time to  Complete Long Term Goals: 4 weeks PT Long Term Goal 1: I in advance HEP PT Long Term Goal 2: Pt to be able to sit for 2 hours for traveling Long Term Goal 3: Pt to be able to walk for an hour for shopping activities Long Term Goal 4: Pt to be able to go up and down steps reciprocally PT Long Term Goal 5: Pain no greater than a 3/10 80% of the day  Problem List Patient Active Problem List   Diagnosis Date Noted  . Weakness of right leg 10/02/2013  . Difficulty in walking(719.7) 10/02/2013  . HTN (hypertension) 04/13/2013  . Hyperlipemia 04/13/2013  . Atrial fibrillation 02/14/2013  . Long term (current) use of anticoagulants 02/14/2013  . MYOCARDIAL INFARCTION 06/25/2008  . EXOGENOUS OBESITY 06/18/2008  . OBSTRUCTIVE SLEEP APNEA 06/18/2008    General Behavior During Therapy: [facility] for tasks assessed/performed PT Plan of Care PT Home Exercise Plan: given  GP Functional Assessment Tool Used: foto Functional Limitation: Mobility: Walking and moving around Mobility: Walking and Moving Around Current Status (Z6109): At least 60 percent but less than 80 percent impaired, limited or restricted Mobility: Walking and Moving Around Goal Status [phone]): At least 40 percent but less than 60 percent impaired, limited or restricted  RUSSELL,CINDY 10/02/2013, 10:54 AM  Physician Documentation Your signature is required to indicate approval of the treatment plan as stated above.  Please sign and either send electronically or make a copy of this report for your files and return this physician signed original.   Please mark one 1.__approve of plan  2. ___approve of plan with the following conditions.   ______________________________                                                          _____________________ Physician Signature                                                                                                             Date

## 2013-10-04 ENCOUNTER — Ambulatory Visit (HOSPITAL_COMMUNITY): Payer: Medicare Other | Admitting: Physical Therapy

## 2013-10-09 ENCOUNTER — Ambulatory Visit (HOSPITAL_COMMUNITY)
Admission: RE | Admit: 2013-10-09 | Discharge: 2013-10-09 | Disposition: A | Discharge status: Home / Self Care | Payer: Medicare Other | Source: Ambulatory Visit | Attending: Family Medicine | Admitting: Family Medicine

## 2013-10-09 DIAGNOSIS — R29898 Other symptoms and signs involving the musculoskeletal system: Secondary | ICD-10-CM

## 2013-10-09 DIAGNOSIS — R262 Difficulty in walking, not elsewhere classified: Secondary | ICD-10-CM

## 2013-10-09 NOTE — Progress Notes (Signed)
Physical Therapy Treatment Patient Details  Name: Joel Rogers MRN: [id number] Date of Birth: 04/29/1951  Today's Date: 10/09/2013 Time: 0900 (Pt 15' late)-0935 PT Time Calculation (min): 35 min  charge There ex 35 Visit#: 2 of 8  Re-eval: 11/01/13    Authorization: [employer] medicare  Subjective: Symptoms/Limitations Symptoms: Pt states he has been trying to do his exercises but has not done them as much as he shoulld; pt is sore. Pain Assessment Currently in Pain?: No/denies   Exercise/Treatments     Stretches   (adductor stretch supine 15" x 3) Aerobic Stationary Bike: Nustep L4 hills x 8:00   Standing Side Lunges: Both;10 reps Terminal Knee Extension: Strengthening;Right;10 reps Lateral Step Up: Right;10 reps Forward Step Up: Right;10 reps Rocker Board: 1 minute;Limitations Rocker Board Limitations: Rt/Lt; A/P SLS with Vectors: B 10" x 3 @   Supine Bridges: 10 reps;Limitations Bridges Limitations: with adduction.    Physical Therapy Assessment and Plan PT Assessment and Plan Clinical Impression Statement: Pt demonstrated good technique with all new exercises. PT Frequency: Min 2X/week PT Duration: 4 weeks PT Treatment/Interventions: Gait training;Stair training;Therapeutic activities;Therapeutic exercise;Modalities;Manual techniques PT Plan: begin forward lunge; lunge walking; sidestep with t-band    Goals  progressing  Problem List Patient Active Problem List   Diagnosis Date Noted  . Weakness of right leg 10/02/2013  . Difficulty in walking(719.7) 10/02/2013  . HTN (hypertension) 04/13/2013  . Hyperlipemia 04/13/2013  . Atrial fibrillation 02/14/2013  . Long term (current) use of anticoagulants 02/14/2013  . MYOCARDIAL INFARCTION 06/25/2008  . EXOGENOUS OBESITY 06/18/2008  . OBSTRUCTIVE SLEEP APNEA 06/18/2008       GP    Jenika Chiem,CINDY 10/09/2013, 9:36 AM

## 2013-10-11 ENCOUNTER — Ambulatory Visit (HOSPITAL_COMMUNITY)
Admission: RE | Admit: 2013-10-11 | Discharge: 2013-10-11 | Disposition: A | Discharge status: Home / Self Care | Payer: Medicare Other | Source: Ambulatory Visit | Attending: Family Medicine | Admitting: Family Medicine

## 2013-10-11 DIAGNOSIS — R29898 Other symptoms and signs involving the musculoskeletal system: Secondary | ICD-10-CM

## 2013-10-11 DIAGNOSIS — R262 Difficulty in walking, not elsewhere classified: Secondary | ICD-10-CM

## 2013-10-11 NOTE — Progress Notes (Signed)
Physical Therapy Treatment Patient Details  Name: Joel Rogers MRN: [id number] Date of Birth: 1950-12-05  Today's Date: 10/11/2013 Time: 4098-1191 PT Time Calculation (min): 55 min Charge:  There ex [phone]; IP [phone] Visit#: 3 of 8  Re-eval: 11/01/13    Authorization: [employer] medicare  Subjective: Symptoms/Limitations Symptoms: Pt states he is sore but no jt pain. Pain Assessment Currently in Pain?: No/denies  Exercise/Treatments [place] Stretch:  (adductor stretch supine 15" x 3) Aerobic Stationary Bike: Nustep L4 hills x 8:00 Standing Heel Raises: 10 reps Side Lunges: Both;10 reps Terminal Knee Extension: Strengthening;Right;10 reps Lateral Step Up: Right;10 reps Forward Step Up: Right;10 reps Functional Squat: 10 reps Rocker Board: 1 minute;Limitations Rocker Board Limitations: Rt/Lt; A/P SLS with Vectors: B 10" x 3 @ Other Standing Knee Exercises: slow marching x 10 Supine Bridges: 10 reps;Limitations Bridges Limitations: with adduction.     Modalities Modalities: Cryotherapy Cryotherapy Number Minutes Cryotherapy: 10 Minutes Cryotherapy Location: Hip (lateral and medial) Type of Cryotherapy: Ice pack  Physical Therapy Assessment and Plan PT Assessment and Plan Clinical Impression Statement: Pt needed verbal cuing to keep body in proper alignment with exercises. Added heel raises, functional squats ( needing significant cuing for proper technique) and marching.  Pt states IP significantly improved pain. Pt will benefit from skilled therapeutic intervention in order to improve on the following deficits: Decreased activity tolerance;Decreased strength;Difficulty walking;Pain Rehab Potential: Good PT Frequency: Min 2X/week PT Duration: 4 weeks PT Treatment/Interventions: Gait training;Stair training;Therapeutic activities;Therapeutic exercise;Modalities;Manual techniques PT Plan: begin forward lunge; lunge walking; sidestep with t-band       Problem  List Patient Active Problem List   Diagnosis Date Noted  . Weakness of right leg 10/02/2013  . Difficulty in walking(719.7) 10/02/2013  . HTN (hypertension) 04/13/2013  . Hyperlipemia 04/13/2013  . Atrial fibrillation 02/14/2013  . Long term (current) use of anticoagulants 02/14/2013  . MYOCARDIAL INFARCTION 06/25/2008  . EXOGENOUS OBESITY 06/18/2008  . OBSTRUCTIVE SLEEP APNEA 06/18/2008    General Behavior During Therapy: [facility] for tasks assessed/performed  GP    [name],CINDY 10/11/2013, 11:11 AM

## 2013-10-16 ENCOUNTER — Inpatient Hospital Stay (HOSPITAL_COMMUNITY): Admission: RE | Admit: 2013-10-16 | Payer: Medicare Other | Source: Ambulatory Visit | Admitting: Physical Therapy

## 2013-10-23 ENCOUNTER — Ambulatory Visit (HOSPITAL_COMMUNITY): Payer: Medicare Other | Admitting: *Deleted

## 2013-10-25 ENCOUNTER — Ambulatory Visit (HOSPITAL_COMMUNITY)
Admission: RE | Admit: 2013-10-25 | Discharge: 2013-10-25 | Disposition: A | Discharge status: Home / Self Care | Payer: Medicare Other | Source: Ambulatory Visit | Attending: Orthopedic Surgery | Admitting: Orthopedic Surgery

## 2013-10-25 ENCOUNTER — Other Ambulatory Visit: Payer: Self-pay | Admitting: Cardiovascular Disease

## 2013-10-25 DIAGNOSIS — R262 Difficulty in walking, not elsewhere classified: Secondary | ICD-10-CM | POA: Insufficient documentation

## 2013-10-25 DIAGNOSIS — M25569 Pain in unspecified knee: Secondary | ICD-10-CM | POA: Insufficient documentation

## 2013-10-25 DIAGNOSIS — R29898 Other symptoms and signs involving the musculoskeletal system: Secondary | ICD-10-CM | POA: Insufficient documentation

## 2013-10-25 DIAGNOSIS — IMO0001 Reserved for inherently not codable concepts without codable children: Secondary | ICD-10-CM | POA: Insufficient documentation

## 2013-10-25 DIAGNOSIS — I10 Essential (primary) hypertension: Secondary | ICD-10-CM | POA: Insufficient documentation

## 2013-10-25 LAB — PROTIME-INR
INR: 2.13 — ABNORMAL HIGH (ref <1.50)
Prothrombin Time: 23.1 seconds — ABNORMAL HIGH (ref 11.6–15.2)

## 2013-10-25 NOTE — Progress Notes (Signed)
Physical Therapy Treatment Patient Details  Name: Joel Rogers MRN: [id number] Date of Birth: 1951/03/22  Today's Date: 10/25/2013 Time: 0906 (Pt 15' late for appt.)-0942 PT Time Calculation (min): 36 min Charges: Therex x 10'(2725-3664) Ice x [phone])  Visit#: 4 of 8  Re-eval: 11/01/13  Authorization: [employer]  Authorization Time Period:    Authorization Visit#: 4 of 10   Subjective: Symptoms/Limitations Symptoms: pt reprots increased swelling because he has not been to the pool lately. Pain Assessment Currently in Pain?: Yes Pain Score: 8  Pain Location: Hip Pain Orientation: Right;Lateral Pain Frequency: Intermittent  Precautions/Restrictions     Exercise/Treatments Standing Heel Raises: 10 reps Lateral Step Up: Right;10 reps;Step Height: 4" Forward Step Up: Right;10 reps;Step Height: 6" Functional Squat: 10 reps Rocker Board: 2 minutes Rocker Board Limitations: Rt/Lt; A/P SLS with Vectors: R: 20" L:25" max of 3 Other Standing Knee Exercises: slow marching x 10  Modalities Modalities: Cryotherapy Cryotherapy Number Minutes Cryotherapy: 10 Minutes Cryotherapy Location: Hip (right hip/groin) Type of Cryotherapy: Ice pack  Physical Therapy Assessment and Plan PT Assessment and Plan Clinical Impression Statement: Tx limited by time. Pt displays improve proprioceptive control with SLS. Pt displays improved form with functional squats. Ice applied to right hip/groin at end of session to limit pain and inflammation. Pt reports 0/10 pain at end of session, Pt will benefit from skilled therapeutic intervention in order to improve on the following deficits: Decreased activity tolerance;Decreased strength;Difficulty walking;Pain Rehab Potential: Good PT Frequency: Min 2X/week PT Duration: 4 weeks PT Treatment/Interventions: Gait training;Stair training;Therapeutic activities;Therapeutic exercise;Modalities;Manual techniques PT Plan: Begin forward lunge;  lunge walking; sidestep with t-band when able.    Problem List Patient Active Problem List   Diagnosis Date Noted  . Weakness of right leg 10/02/2013  . Difficulty in walking(719.7) 10/02/2013  . HTN (hypertension) 04/13/2013  . Hyperlipemia 04/13/2013  . Atrial fibrillation 02/14/2013  . Long term (current) use of anticoagulants 02/14/2013  . MYOCARDIAL INFARCTION 06/25/2008  . EXOGENOUS OBESITY 06/18/2008  . OBSTRUCTIVE SLEEP APNEA 06/18/2008    PT - End of Session Activity Tolerance: Patient tolerated treatment well General Behavior During Therapy: [facility] for tasks assessed/performed  Seth Bake, PTA  10/25/2013, 9:51 AM

## 2013-10-26 ENCOUNTER — Inpatient Hospital Stay (HOSPITAL_COMMUNITY): Admission: RE | Admit: 2013-10-26 | Payer: Medicare Other | Source: Ambulatory Visit | Admitting: Physical Therapy

## 2013-10-26 ENCOUNTER — Ambulatory Visit (INDEPENDENT_AMBULATORY_CARE_PROVIDER_SITE_OTHER): Payer: Medicare Other | Admitting: Pharmacist Clinician (PhC)/ Clinical Pharmacy Specialist

## 2013-10-26 DIAGNOSIS — Z7901 Long term (current) use of anticoagulants: Secondary | ICD-10-CM

## 2013-10-26 DIAGNOSIS — I4891 Unspecified atrial fibrillation: Secondary | ICD-10-CM

## 2013-11-02 ENCOUNTER — Inpatient Hospital Stay (HOSPITAL_COMMUNITY): Admission: RE | Admit: 2013-11-02 | Payer: Medicare Other | Source: Ambulatory Visit

## 2013-11-06 ENCOUNTER — Inpatient Hospital Stay (HOSPITAL_COMMUNITY): Admission: RE | Admit: 2013-11-06 | Payer: Medicare Other | Source: Ambulatory Visit

## 2013-11-20 ENCOUNTER — Inpatient Hospital Stay (HOSPITAL_COMMUNITY): Admission: RE | Admit: 2013-11-20 | Payer: Medicare Other | Source: Ambulatory Visit | Admitting: Physical Therapy

## 2013-11-26 ENCOUNTER — Other Ambulatory Visit: Payer: Self-pay | Admitting: *Deleted

## 2013-11-26 MED ORDER — ATORVASTATIN CALCIUM 80 MG PO TABS: 80.0000 mg | ORAL_TABLET | Freq: Every day | ORAL | Status: DC

## 2013-12-16 ENCOUNTER — Other Ambulatory Visit: Payer: Self-pay | Admitting: Pharmacist Clinician (PhC)/ Clinical Pharmacy Specialist

## 2013-12-18 NOTE — Telephone Encounter (Signed)
Reminded pt past due for INR check.  Pt promised to go this week

## 2013-12-19 ENCOUNTER — Other Ambulatory Visit: Payer: Self-pay | Admitting: Cardiovascular Disease

## 2013-12-19 LAB — PROTIME-INR
INR: 2.5 — ABNORMAL HIGH (ref <1.50)
Prothrombin Time: 26.4 seconds — ABNORMAL HIGH (ref 11.6–15.2)

## 2013-12-24 ENCOUNTER — Ambulatory Visit (INDEPENDENT_AMBULATORY_CARE_PROVIDER_SITE_OTHER): Payer: Medicare Other | Admitting: Pharmacist Clinician (PhC)/ Clinical Pharmacy Specialist

## 2013-12-24 DIAGNOSIS — I4891 Unspecified atrial fibrillation: Secondary | ICD-10-CM

## 2013-12-24 DIAGNOSIS — Z7901 Long term (current) use of anticoagulants: Secondary | ICD-10-CM

## 2014-01-23 ENCOUNTER — Other Ambulatory Visit: Payer: Self-pay | Admitting: Pharmacist Clinician (PhC)/ Clinical Pharmacy Specialist

## 2014-01-29 ENCOUNTER — Other Ambulatory Visit: Payer: Self-pay | Admitting: Cardiovascular Disease

## 2014-01-29 LAB — PROTIME-INR
INR: 2.77 — ABNORMAL HIGH (ref <1.50)
Prothrombin Time: 28.5 seconds — ABNORMAL HIGH (ref 11.6–15.2)

## 2014-01-30 ENCOUNTER — Ambulatory Visit (INDEPENDENT_AMBULATORY_CARE_PROVIDER_SITE_OTHER): Payer: Medicare Other | Admitting: Pharmacist Clinician (PhC)/ Clinical Pharmacy Specialist

## 2014-01-30 DIAGNOSIS — Z7901 Long term (current) use of anticoagulants: Secondary | ICD-10-CM

## 2014-01-30 DIAGNOSIS — I4891 Unspecified atrial fibrillation: Secondary | ICD-10-CM

## 2014-03-05 ENCOUNTER — Other Ambulatory Visit: Payer: Self-pay | Admitting: Pharmacist Clinician (PhC)/ Clinical Pharmacy Specialist

## 2014-03-05 LAB — PROTIME-INR
INR: 2.33 — ABNORMAL HIGH (ref <1.50)
PROTHROMBIN TIME: 25 s — AB (ref 11.6–15.2)

## 2014-03-06 ENCOUNTER — Ambulatory Visit (INDEPENDENT_AMBULATORY_CARE_PROVIDER_SITE_OTHER): Payer: Medicare Other | Admitting: Pharmacist Clinician (PhC)/ Clinical Pharmacy Specialist

## 2014-03-06 DIAGNOSIS — Z7901 Long term (current) use of anticoagulants: Secondary | ICD-10-CM

## 2014-03-06 DIAGNOSIS — I4891 Unspecified atrial fibrillation: Secondary | ICD-10-CM

## 2014-03-11 ENCOUNTER — Other Ambulatory Visit: Payer: Self-pay | Admitting: Pharmacist Clinician (PhC)/ Clinical Pharmacy Specialist

## 2014-04-03 ENCOUNTER — Encounter: Payer: Self-pay | Admitting: Cardiovascular Disease

## 2014-04-03 ENCOUNTER — Ambulatory Visit (INDEPENDENT_AMBULATORY_CARE_PROVIDER_SITE_OTHER): Payer: Medicare Other | Admitting: Cardiovascular Disease

## 2014-04-03 VITALS — BP 124/78 | HR 61 | Ht 72.0 in | Wt 291.0 lb

## 2014-04-03 DIAGNOSIS — I4891 Unspecified atrial fibrillation: Secondary | ICD-10-CM

## 2014-04-03 DIAGNOSIS — R0989 Other specified symptoms and signs involving the circulatory and respiratory systems: Secondary | ICD-10-CM

## 2014-04-03 DIAGNOSIS — E785 Hyperlipidemia, unspecified: Secondary | ICD-10-CM

## 2014-04-03 DIAGNOSIS — I10 Essential (primary) hypertension: Secondary | ICD-10-CM

## 2014-04-03 NOTE — Assessment & Plan Note (Signed)
On statin therapy followed by his PCP 

## 2014-04-03 NOTE — Patient Instructions (Signed)
  We will see you back in follow up in 1 year with Dr Allyson SabalBerry  Dr Allyson SabalBerry has ordered a  Carotid Duplex- This test is an ultrasound of the carotid arteries in your neck. It looks at blood flow through these arteries that supply the brain with blood. Allow one hour for this exam. There are no restrictions or special instructions.

## 2014-04-03 NOTE — Assessment & Plan Note (Signed)
Rate controlled on Coumadin anticoagulation 

## 2014-04-03 NOTE — Assessment & Plan Note (Signed)
Controlled on current medications 

## 2014-04-03 NOTE — Progress Notes (Signed)
04/03/2014 Joel Rogers   [birth year]  [id number]  Primary Physician Joel RuthsMCGOUGH,WILLIAM M, MD Primary Cardiologist: Runell GessJonathan J. Jyron Turman MD Roseanne RenoFACP,FACC,FAHA, FSCAI   HPI:  The patient is a very pleasant 63 year old, severely overweight, married, African American male, father of 2, grandfather to 4 grandchildren who I last saw 6 months ago. He has a history of chronic atrial fibrillation, rate controlled on Coumadin anticoagulation. He has obstructive sleep apnea on CPAP, hypertension, and hyperlipidemia. He was catheterized by Dr. Lavonne ChickBill Gamble in 2007 and found to have normal coronary arteries and normal coronary function. He had laparoscopic Roux-en-Y gastric bypass surgery in November of 2009 [facility] and has lost over 140 pounds. He feels clinically improved. His last lipid profile performed a year ago was excellent with an LDL of 82, HDL of 52, total cholesterol 142. He complains of right greater than left lower extremity swelling, which has been chronic. He has some venous reflux in his lesser saphenous vein, probably not amenable to endovenous ablation. Since his last visit 04/13/13 he denies chest pain or changes in breathing. He does exercise in April.    Current Outpatient Prescriptions  Medication Sig Dispense Refill  . amLODipine (NORVASC) 10 MG tablet Take 10 mg by mouth daily.      Marland Kitchen. atorvastatin (LIPITOR) 80 MG tablet Take 1 tablet (80 mg total) by mouth at bedtime.  90 tablet  1  . Calcium Carbonate-Vitamin D (CALCIUM + D PO) Take 1 tablet by mouth daily.      Marland Kitchen. losartan (COZAAR) 100 MG tablet Take 1 tablet (100 mg total) by mouth daily.  30 tablet  6  . metoprolol succinate (TOPROL-XL) 100 MG 24 hr tablet Take 100 mg by mouth daily. Take with or immediately following a meal.      . mupirocin ointment (BACTROBAN) 2 %       . torsemide (DEMADEX) 20 MG tablet Take 20 mg by mouth at bedtime.       Marland Kitchen. warfarin (COUMADIN) 5 MG tablet TAKE ONE TO ONE & ONE-HALF TABLETS BY MOUTH ONCE DAILY  AS DIRECTED  40 tablet  1   No current facility-administered medications for this visit.    No Known Allergies  History   Social History  . Marital Status: Married    Spouse Name: N/A    Number of Children: 2  . Years of Education: N/A   Occupational History  . Not on file.   Social History Main Topics  . Smoking status: Never Smoker   . Smokeless tobacco: Not on file  . Alcohol Use: No  . Drug Use: No  . Sexual Activity:    Other Topics Concern  . Not on file   Social History Narrative  . No narrative on file     Review of Systems: General: negative for chills, fever, night sweats or weight changes.  Cardiovascular: negative for chest pain, dyspnea on exertion, edema, orthopnea, palpitations, paroxysmal nocturnal dyspnea or shortness of breath Dermatological: negative for rash Respiratory: negative for cough or wheezing Urologic: negative for hematuria Abdominal: negative for nausea, vomiting, diarrhea, bright red blood per rectum, melena, or hematemesis Neurologic: negative for visual changes, syncope, or dizziness All other systems reviewed and are otherwise negative except as noted above.    Blood pressure 124/78, pulse 61, height 6' (1.829 Rogers), weight 291 lb (131.997 kg).  General appearance: alert and no distress Neck: no adenopathy, no JVD, supple, symmetrical, trachea midline, thyroid not enlarged, symmetric, no tenderness/mass/nodules and  soft bilateral carotid bruits Lungs: clear to auscultation bilaterally Heart: irregularly irregular rhythm Extremities: 2+ pedal pulses bilaterally, 2+ tense edema right pretibial area and calf with venous stasis changes  EKG atrial fibrillation with a ventricular response of 61 and diffuse T-wave inversion unchanged from prior EKGs  ASSESSMENT AND PLAN:   Atrial fibrillation Rate controlled on Coumadin anticoagulation  HTN (hypertension) Controlled on current medications  Hyperlipemia On statin therapy followed  by his PCP      Runell GessJonathan J. Javarian Jakubiak MD [facility] HospitalFACP,FACC,FAHA, [facility] - All SaintsFSCAI 04/03/2014 9:32 AM

## 2014-04-09 ENCOUNTER — Ambulatory Visit (HOSPITAL_COMMUNITY)
Admission: RE | Admit: 2014-04-09 | Discharge: 2014-04-09 | Disposition: A | Discharge status: Home / Self Care | Payer: Medicare Other | Source: Ambulatory Visit | Attending: Internal Medicine | Admitting: Internal Medicine

## 2014-04-09 DIAGNOSIS — R0989 Other specified symptoms and signs involving the circulatory and respiratory systems: Secondary | ICD-10-CM | POA: Insufficient documentation

## 2014-04-09 NOTE — Progress Notes (Signed)
Carotid Duplex Completed. °Brianna L Mazza,RVT °

## 2014-04-15 ENCOUNTER — Encounter: Payer: Self-pay | Admitting: *Deleted

## 2014-04-25 ENCOUNTER — Telehealth: Payer: Self-pay | Admitting: Cardiovascular Disease

## 2014-04-25 NOTE — Telephone Encounter (Signed)
Please call,pt had doppler on 04-09-14 and still have not gotten his results.

## 2014-04-25 NOTE — Telephone Encounter (Signed)
Results mailed to  pt.

## 2014-05-20 ENCOUNTER — Other Ambulatory Visit: Payer: Self-pay | Admitting: Pharmacist Clinician (PhC)/ Clinical Pharmacy Specialist

## 2014-05-20 ENCOUNTER — Ambulatory Visit (INDEPENDENT_AMBULATORY_CARE_PROVIDER_SITE_OTHER): Payer: Medicare Other | Admitting: Pharmacist Clinician (PhC)/ Clinical Pharmacy Specialist

## 2014-05-20 DIAGNOSIS — Z7901 Long term (current) use of anticoagulants: Secondary | ICD-10-CM

## 2014-05-20 LAB — PROTIME-INR
INR: 2.01 — AB (ref <1.50)
PROTHROMBIN TIME: 22.8 s — AB (ref 11.6–15.2)

## 2014-05-29 ENCOUNTER — Other Ambulatory Visit: Payer: Self-pay | Admitting: Pharmacist Clinician (PhC)/ Clinical Pharmacy Specialist

## 2014-05-30 NOTE — Telephone Encounter (Signed)
Rx was sent to pharmacy electronically. 

## 2014-06-03 ENCOUNTER — Other Ambulatory Visit: Payer: Self-pay

## 2014-06-03 MED ORDER — LOSARTAN POTASSIUM 100 MG PO TABS: 100.0000 mg | ORAL_TABLET | Freq: Every day | ORAL | Status: DC

## 2014-06-03 NOTE — Telephone Encounter (Signed)
Rx was sent to pharmacy electronically. 

## 2014-07-08 ENCOUNTER — Other Ambulatory Visit: Payer: Self-pay | Admitting: Cardiovascular Disease

## 2014-07-16 ENCOUNTER — Other Ambulatory Visit: Payer: Self-pay | Admitting: Cardiovascular Disease

## 2014-07-17 ENCOUNTER — Ambulatory Visit (INDEPENDENT_AMBULATORY_CARE_PROVIDER_SITE_OTHER): Payer: Medicare Other | Admitting: Pharmacist Clinician (PhC)/ Clinical Pharmacy Specialist

## 2014-07-17 DIAGNOSIS — Z7901 Long term (current) use of anticoagulants: Secondary | ICD-10-CM

## 2014-07-17 LAB — PROTIME-INR
INR: 1.92 — AB (ref <1.50)
Prothrombin Time: 22 seconds — ABNORMAL HIGH (ref 11.6–15.2)

## 2014-08-07 ENCOUNTER — Other Ambulatory Visit: Payer: Self-pay | Admitting: Cardiovascular Disease

## 2014-08-16 ENCOUNTER — Other Ambulatory Visit: Payer: Self-pay | Admitting: Pharmacist Clinician (PhC)/ Clinical Pharmacy Specialist

## 2014-08-17 LAB — PROTIME-INR
INR: 2.28 — ABNORMAL HIGH (ref <1.50)
PROTHROMBIN TIME: 25.1 s — AB (ref 11.6–15.2)

## 2014-08-19 ENCOUNTER — Ambulatory Visit (INDEPENDENT_AMBULATORY_CARE_PROVIDER_SITE_OTHER): Payer: Medicare Other | Admitting: Pharmacist Clinician (PhC)/ Clinical Pharmacy Specialist

## 2014-08-19 DIAGNOSIS — Z7901 Long term (current) use of anticoagulants: Secondary | ICD-10-CM

## 2014-08-30 ENCOUNTER — Emergency Department (HOSPITAL_COMMUNITY)
Admission: EM | Admit: 2014-08-30 | Discharge: 2014-08-30 | Disposition: A | Discharge status: Home / Self Care | Payer: Medicare Other | Attending: Emergency Medicine | Admitting: Emergency Medicine

## 2014-08-30 ENCOUNTER — Encounter (HOSPITAL_COMMUNITY): Payer: Self-pay | Admitting: Emergency Medicine

## 2014-08-30 DIAGNOSIS — I83891 Varicose veins of right lower extremities with other complications: Secondary | ICD-10-CM

## 2014-08-30 DIAGNOSIS — Z792 Long term (current) use of antibiotics: Secondary | ICD-10-CM | POA: Diagnosis not present

## 2014-08-30 DIAGNOSIS — I10 Essential (primary) hypertension: Secondary | ICD-10-CM | POA: Diagnosis not present

## 2014-08-30 DIAGNOSIS — Z9981 Dependence on supplemental oxygen: Secondary | ICD-10-CM | POA: Insufficient documentation

## 2014-08-30 DIAGNOSIS — E785 Hyperlipidemia, unspecified: Secondary | ICD-10-CM | POA: Insufficient documentation

## 2014-08-30 DIAGNOSIS — L97919 Non-pressure chronic ulcer of unspecified part of right lower leg with unspecified severity: Secondary | ICD-10-CM | POA: Diagnosis not present

## 2014-08-30 DIAGNOSIS — I83013 Varicose veins of right lower extremity with ulcer of ankle: Secondary | ICD-10-CM | POA: Insufficient documentation

## 2014-08-30 DIAGNOSIS — Z79899 Other long term (current) drug therapy: Secondary | ICD-10-CM | POA: Insufficient documentation

## 2014-08-30 DIAGNOSIS — Z7901 Long term (current) use of anticoagulants: Secondary | ICD-10-CM | POA: Diagnosis not present

## 2014-08-30 DIAGNOSIS — D689 Coagulation defect, unspecified: Secondary | ICD-10-CM | POA: Diagnosis present

## 2014-08-30 DIAGNOSIS — E669 Obesity, unspecified: Secondary | ICD-10-CM | POA: Diagnosis not present

## 2014-08-30 DIAGNOSIS — G4730 Sleep apnea, unspecified: Secondary | ICD-10-CM | POA: Insufficient documentation

## 2014-08-30 DIAGNOSIS — I4891 Unspecified atrial fibrillation: Secondary | ICD-10-CM | POA: Diagnosis not present

## 2014-08-30 NOTE — ED Notes (Signed)
Pt has swollen area to post rt calf, Pt was sitting in recliner and area began to bleed. Has a wrap on rt foot and ankle, after seen vein specialist yesterday.  Pt is to got to wound center for treatment of the the ankle wound.

## 2014-08-30 NOTE — ED Notes (Signed)
Patient states at 1700 varicose vein "busted" at this time right lower leg is not bleeding. Patient denies pain, denies chest pain, denies shortness of breath. Patient is ambulatory without deficit.

## 2014-08-30 NOTE — Discharge Instructions (Signed)
Please keep your legs elevated above your waist. Please return to the emergency department if bleeding returns. Bleeding Varicose Veins Varicose veins are veins that have become enlarged and twisted. Valves in the veins help return blood from the leg to the heart. If these valves are damaged, blood flows backwards and backs up into the veins in the leg near the skin. This causes the veins to become larger because of increased pressure within. Sometimes these veins bleed. CAUSES  Factors that can lead to bleeding varicose veins include:  Thinning of the skin that covers the veins. This skin is stretched as the veins enlarge.  Weak and thinning walls of the varicose veins. These thin walls are part of the reason why blood is not flowing normally to the heart.  Having high pressure in the veins. This high pressure occurs because the blood is not flowing freely back up to the heart.  Injury. Even a small injury to a varicose vein can cause bleeding.  Open wounds. A sore may develop near a varicose vein and not heal. This makes bleeding more likely.  Taking medicine that thins the blood. These medicines may include aspirin, anti-inflammatory medicine, and other blood thinners. SYMPTOMS  If bleeding is on the outside surface of the skin, blood can be seen. Sometimes, the bleeding stays under the skin. If this happens, the blue or purple area will spread beyond the vein. This discoloration may be visible. DIAGNOSIS  To decide if you have a bleeding varicose vein, your caregiver may:  Ask about your symptoms. This will include when you first saw bleeding.  Ask about how long you have had varicose veins and if they cause you problems.  Ask about your overall health.  Ask about possible causes, like recent cuts or if the area near the varicose veins was bumped or injured.  Examine the skin or leg that concerns you. Your caregiver will probably feel the veins.  Order imaging tests. These create  detailed pictures of the veins. TREATMENT  The first goal of treating bleeding varicose veins is to stop the bleeding. Then, the aim is to keep any bleeding from happening again. Treatment will depend on the cause of the bleeding and how bad it is. Ask your caregiver about what would be best for you. Options include:  Raising (elevating) your leg. Lie down with your leg propped up on a pillow or cushion. Your foot should be above your heart.  Applying pressure to the spot that is bleeding. The bleeding should stop in a short time.  Wearing elastic stocking that "compress" your legs (compression stockings). An elastic bandage may do the same thing.  Applying an antibiotic cream on sores that are not healing.  Surgically removing or closing off the bleeding varicose veins. HOME CARE INSTRUCTIONS   Apply any creams that your caregiver prescribed. Follow the directions carefully.  Wear compression stockings or any special wraps that were prescribed. Make sure you know:  If you should wear them every day.  How long you should wear them.  If veins were removed or closed, a bandage (dressing) will probably cover the area. Make sure you know:  How often the dressing should be changed.  Whether the area can get wet.  When you can leave the skin uncovered.  Check your skin every day. Look for new sores and signs of bleeding.  To prevent future bleeding:  Use extra care in situations where you could cut your legs. Shaving, for example, or working  outside in the garden.  Try to keep your legs elevated as much as possible. Lie down when you can. SEEK MEDICAL CARE IF:   You have any questions about how to wear compression stockings or elastic bandages.  Your veins continue to bleed.  Sores develop near your varicose veins.  You have a sore that does not heal or gets bigger.  Pain increases in your leg.  The area around a varicose vein becomes warm, red, or tender to the  touch.  You notice a yellowish fluid that smells bad coming from a spot where there was bleeding.  You develop a fever of more than 100.5 F (38.1 C). SEEK IMMEDIATE MEDICAL CARE IF:   You develop a fever of more than 102 F (38.9 C). Document Released: 03/27/2009 Document Revised: 01/31/2012 Document Reviewed: 03/12/2014 [facility] Patient Information 2015 [employer], [place]. This information is not intended to replace advice given to you by your health care provider. Make sure you discuss any questions you have with your health care provider.

## 2014-08-30 NOTE — ED Provider Notes (Signed)
CSN: [id number]     Arrival date & time 08/30/14  [id number] History   First MD Initiated Contact with Patient 08/30/14 1956    This chart was scribed for non-physician practitioner, Ivery QualeHobson Yordin Rhoda, PA, working with Vida RollerBrian D Miller, MD by Marica OtterNusrat Rahman, ED Scribe. This patient was seen in room APFT20/APFT20 and the patient's care was started at 8:37 PM.  Chief Complaint  Patient presents with  . Leg Injury   The history is provided by the patient. No language interpreter was used.   PCP: Kirk RuthsMCGOUGH,WILLIAM M, MD HPI Comments: Joel Rogers is a 63 y.o. male, with medical Hx noted below, who presents to the Emergency Department complaining of varicose vein rupture onset at [facility]PM tonight. Pt states that the varicose vein "busted on him" and there was profuse bleeding following the rupture, though during exam there is no active bleeding. Pt notes that he has been on coumadin for quite sometime. Pt denies any pain, chest pain, SOB, syncope. Pt is ambulatory without deficit.   Pt reports he had his varicose vein checked two weeks ago at which time he was notified everything was well.   Past Medical History  Diagnosis Date  . Atrial fibrillation   . Sleep apnea     on CPAP  . Hypertension   . Hyperlipidemia   . Obesity   . Venous insufficiency   . Normal coronary arteries     by cardiac catheterization performed 03/14/06   Past Surgical History  Procedure Laterality Date  . Gastric bypass  09/2008  . Total hip arthroplasty      right hip   Family History  Problem Relation Age of Onset  . Hypertension Father    History  Substance Use Topics  . Smoking status: Never Smoker   . Smokeless tobacco: Not on file  . Alcohol Use: No    Review of Systems  Constitutional: Negative for fever and chills.  Respiratory: Negative for shortness of breath.   Cardiovascular: Negative for chest pain.  Musculoskeletal: Negative for gait problem.       Varicose vein rupture   Skin: Positive for wound  (ulcer on right ankle ).  Neurological: Negative for syncope.  Hematological: Bruises/bleeds easily.  Psychiatric/Behavioral: Negative for confusion.  All other systems reviewed and are negative.  Allergies  Review of patient's allergies indicates no known allergies.  Home Medications   Prior to Admission medications   Medication Sig Start Date End Date Taking? Authorizing Provider  amLODipine (NORVASC) 10 MG tablet Take 10 mg by mouth daily.   Yes Historical Provider, MD  atorvastatin (LIPITOR) 40 MG tablet Take 40 mg by mouth at bedtime.   Yes Historical Provider, MD  Calcium Carbonate-Vitamin D (CALCIUM + D PO) Take 1 tablet by mouth daily.   Yes Historical Provider, MD  doxycycline (VIBRAMYCIN) 100 MG capsule Take 100 mg by mouth 2 (two) times daily. Started on 08/29/14 08/29/14  Yes Historical Provider, MD  metoprolol (LOPRESSOR) 100 MG tablet Take 100 mg by mouth 2 (two) times daily.  07/08/14  Yes Historical Provider, MD  silver sulfADIAZINE (SILVADENE) 1 % cream Apply 1 application topically 4 (four) times daily.  08/29/14  Yes Historical Provider, MD  torsemide (DEMADEX) 20 MG tablet Take 20 mg by mouth at bedtime.    Yes Historical Provider, MD  warfarin (COUMADIN) 5 MG tablet Take 5-7.5 mg by mouth daily. Patient takes 1&1/2 tablet(7.5mg ) on Tuesday,& Thursday and 1 tablet(5mg ) all other days   Yes Historical Provider,  MD   Triage Vitals: BP 130/89  Pulse 77  Temp(Src) 98.8 F (37.1 C) (Oral)  Resp 18  Ht 6' (1.829 m)  Wt 289 lb (131.09 kg)  BMI 39.19 kg/m2  SpO2 100% Physical Exam  Nursing note and vitals reviewed. Constitutional: He is oriented to person, place, and time. He appears well-developed and well-nourished. No distress.  HENT:  Head: Normocephalic and atraumatic.  Eyes: Conjunctivae and EOM are normal.  Neck: Neck supple. No tracheal deviation present.  Cardiovascular: Normal rate.   occasional *to frequent** irregular  beats consistent with AFib.    Pulmonary/Chest: Effort normal and breath sounds normal. No respiratory distress. He has no wheezes. He has no rales. He exhibits no tenderness.  Musculoskeletal: Normal range of motion.  Good ROM of the right hip, right knee Muild crepitus present No posterior mass Swelling and bruisiing of posterior calf Scabbed area over a varicose vein of posterior calf, no active bleeding Mild chronic swelling per pt.  No hot area appreciated  Multiple varicosities of the right lower extremity.    Neurological: He is alert and oriented to person, place, and time.  Skin: Skin is warm and dry.  Bandaged ulcer of the anterior tibial area on the right, not hot and no red streaks   Psychiatric: He has a normal mood and affect. His behavior is normal.    ED Course  Procedures (including critical care time) DIAGNOSTIC STUDIES: Oxygen Saturation is 100% on RA, nl by my interpretation.    COORDINATION OF CARE: 8:45 PM-Discussed treatment plan which includes keeping his right leg elevated above his waist and discussing with pt reasons for why placing stitches at this time is not necessary with pt at bedside and pt agreed to plan.Pt advised to return to the ED if there is any active bleeding from the site.   Labs Review Labs Reviewed - No data to display  Imaging Review No results found.   EKG Interpretation None      MDM No active bleeding from the varicose vein during the emergency department visit. Patient has had PT-INR week ago and was in therapeutic range.  I discussed with the patient the need to keep his lower extremities elevated above her waist. Patient will return immediately if any recurrence of bleeding or any other changes or problems.    Final diagnoses:  Bleeding from varicose vein, right    *I have reviewed nursing notes, vital signs, and all appropriate lab and imaging results for this patient.**  **I personally performed the services described in this documentation, which  was scribed in my presence. The recorded information has been reviewed and is accurate.Kathie Dike*   Evangelos Paulino M Khiya Friese, PA-C 08/31/14 [id number]

## 2014-08-31 NOTE — ED Provider Notes (Signed)
Medical screening examination/treatment/procedure(s) were performed by non-physician practitioner and as supervising physician I was immediately available for consultation/collaboration.    Aleaha Fickling D Nyjah Denio, MD 08/31/14 1616 

## 2014-09-10 ENCOUNTER — Ambulatory Visit (HOSPITAL_COMMUNITY)
Admission: RE | Admit: 2014-09-10 | Discharge: 2014-09-10 | Disposition: A | Discharge status: Home / Self Care | Payer: Medicare Other | Source: Ambulatory Visit | Attending: Physician Assistant | Admitting: Physician Assistant

## 2014-09-10 DIAGNOSIS — R262 Difficulty in walking, not elsewhere classified: Secondary | ICD-10-CM | POA: Diagnosis not present

## 2014-09-10 DIAGNOSIS — Z5189 Encounter for other specified aftercare: Secondary | ICD-10-CM | POA: Insufficient documentation

## 2014-09-10 DIAGNOSIS — I83013 Varicose veins of right lower extremity with ulcer of ankle: Secondary | ICD-10-CM | POA: Insufficient documentation

## 2014-09-10 NOTE — Progress Notes (Addendum)
Physical Therapy - Wound Therapy  Evaluation   Patient Details  Name: Claudie LeachRalph L Dieujuste MRN: [id number] Date of Birth: 1951-05-16  Today's Date: 09/10/2014 Time: 7829-56210936-1015 Time Calculation (min): 39 min  Charges: 1 Eval, Selective debridement <20cm  Visit#: 1 of 8  Re-eval: 10/10/14  Subjective Subjective Assessment Subjective: Patient states: he has had the wound on his anterior medial shin since April 2015 and that he prior attempted to heal would utilizing a wound care cream from MD that aggravated the wound and made it worse follwoign which he got a new wound care cream which has resulted in improved healing. Patient's Rt LE is notably swollen which patient states has been the case for > 5 years nd that his DM told him is was just fatty tissue as he has had multiple ultrasounds that found no mjor artery or venous claudication. Patient is pre diabetic and manages it with diet. Patient  had previously been to ER for posterior calf wound that has recently healed withtou specific wound care. Patient notes a long history of vericose veins.  Patient and Family Stated Goals: to be able to walk withotu pain due to Rt LE wound.  Date of Onset: 03/08/14 Prior Treatments: creams given by MD with mixed results that have only recently been favorable.   Pain Assessment Pain Assessment Pain Assessment: 0-10 Pain Score: 1  Pain Type: Chronic pain Pain Location: Tibia Pain Orientation: Right;Anterior;Medial;Mid Pain Descriptors / Indicators: Burning Pain Onset: With Activity Patients Stated Pain Goal: 0  Wound Therapy Wound / Incision (Open or Dehisced) 09/10/14 Laceration Tibial Right;Medial;Anterior;Mid Rt LE swollen compared to Lt, Rt LE discolored, likely venous insufficency,  (Active)  Dressing Type ABD 09/10/2014  7:25 PM  Dressing Changed New 09/10/2014  7:25 PM  Dressing Status Clean 09/10/2014  7:25 PM  Dressing Change Frequency Every 3 days 09/10/2014  7:25 PM  Site / Wound  Assessment Clean;Red 09/10/2014  7:25 PM  % Wound base Red or Granulating 100% 09/10/2014  7:25 PM  % Wound base Yellow 0% 09/10/2014  7:25 PM  % Wound base Black 0% 09/10/2014  7:25 PM  % Wound base Other (Comment) 0% 09/10/2014  7:25 PM  Peri-wound Assessment Intact;Maceration;Pink 09/10/2014  7:25 PM  Wound Length (cm) 2.1 cm 09/10/2014  7:25 PM  Wound Width (cm) 5.1 cm 09/10/2014  7:25 PM  Wound Depth (cm) 0.1 cm 09/10/2014  7:25 PM  Margins Unattached edges (unapproximated) 09/10/2014  7:25 PM  Closure None 09/10/2014  7:25 PM  Drainage Amount Scant 09/10/2014  7:25 PM  Drainage Description No odor;Sanguineous 09/10/2014  7:25 PM  Non-staged Wound Description Partial thickness 09/10/2014  7:25 PM  Treatment Cleansed;Debridement (Selective) 09/10/2014  7:25 PM   Selective Debridement Selective Debridement - Location: Rt anterior medial shin Selective Debridement - Tools Used: Scalpel Selective Debridement - Tissue Removed: dead skin   Physical Therapy Assessment and Plan Wound Therapy - Assess/Plan/Recommendations Wound Therapy - Clinical Statement: Patient displays wound on anterior Rt LE secondary to swelling in Rt LE of unkown history but due to discoloration and history of vericose veins the swellign and discoloration is likely attributed to venous insufficiency. Wound dressed with honey and profore  to improve healing environment.  Wound Therapy - Functional Problem List: difficulty walking secondary pain from wound Factors Delaying/Impairing Wound Healing: Diabetes Mellitus;Vascular compromise (pre-diabetes, history of venous insufficiency) Hydrotherapy Plan: Debridement;Dressing change;Patient/family education Wound Therapy - Frequency:  (2x a week for 4) Wound Therapy - Current Recommendations: PT Wound Plan: Use  of honey and profore for dressing of wound to improve wound healing environment. Continue selective dridement as needed.       Goals Wound Therapy Goals -  Improve the function of patient's integumentary system by progressing the wound(s) through the phases of wound healing by: Decrease Necrotic Tissue to: 0% Decrease Necrotic Tissue - Progress: Goal set today Increase Granulation Tissue to: 100% Increase Granulation Tissue - Progress: Goal set today Decrease Length/Width/Depth by (cm): 2.1x5.1 Decrease Length/Width/Depth - Progress: Goal set today Improve Drainage Characteristics: Mod Improve Drainage Characteristics - Progress: Goal set today Patient/Family will be able to : wear profore/compression garment without cuing between treatments.  Patient/Family Instruction Goal - Progress: Progressing toward goal Additional Wound Therapy Goal: Patient wll be able to walk wihtou pain attributed to Rt LE wound Goals/treatment plan/discharge plan were made with and agreed upon by patient/family: Yes Time For Goal Achievement: Other (comment) (4 weeks) Wound Therapy - Potential for Goals: Good  Problem List Patient Active Problem List   Diagnosis Date Noted  . Weakness of right leg 10/02/2013  . Difficulty in walking(719.7) 10/02/2013  . HTN (hypertension) 04/13/2013  . Hyperlipemia 04/13/2013  . Atrial fibrillation 02/14/2013  . Long term (current) use of anticoagulants 02/14/2013  . MYOCARDIAL INFARCTION 06/25/2008  . EXOGENOUS OBESITY 06/18/2008  . OBSTRUCTIVE SLEEP APNEA 06/18/2008    GP Functional Assessment Tool Used: Clinical judgement 51% limited Functional Limitation: Mobility: Walking and moving around Mobility: Walking and Moving Around Current Status [phone](G8978): At least 40 percent but less than 60 percent impaired, limited or restricted Mobility: Walking and Moving Around Goal Status [phone](G8979): At least 20 percent but less than 40 percent impaired, limited or restricted  Geran Haithcock R 09/10/2014, 7:46 PM

## 2014-09-13 ENCOUNTER — Ambulatory Visit (HOSPITAL_COMMUNITY)
Admission: RE | Admit: 2014-09-13 | Discharge: 2014-09-13 | Disposition: A | Discharge status: Home / Self Care | Payer: Medicare Other | Source: Ambulatory Visit | Attending: Physician Assistant | Admitting: Physician Assistant

## 2014-09-13 DIAGNOSIS — Z5189 Encounter for other specified aftercare: Secondary | ICD-10-CM | POA: Diagnosis not present

## 2014-09-13 NOTE — Progress Notes (Addendum)
Physical Therapy - Wound Therapy  Treatment   Patient Details  Name: Joel Rogers MRN: [id number] Date of Birth: 05-20-1951  Today's Date: 09/13/2014 Time: 4098-11910935-1015 Time Calculation (min): 40 min    Selective debridement <20cm, Education on self care 1005-1015  Visit#: 2 of 8  Re-eval: 10/10/14  Subjective Subjective Assessment Subjective: Patient states: improved pain in his leg since last session, notes that the bandage slid down a little and made it difficulty to put on shoes.  he has had the wound on his anterior medial shin since April 2015 and that he prior attempted to heal would utilizing a wound care cream from MD that aggravated the wound and made it worse follwoign which he got a new wound care cream which has resulted in improved healing. Patient's Rt LE is notably swollen which patient states has been the case for > 5 years nd that his DM told him is was just fatty tissue as he has had multiple ultrasounds that found no mjor artery or venous claudication. Patient is pre diabetic and manages it with diet. Patient  had previously been to ER for posterior calf wound that has recently healed without specific wound care. Patient notes a long history of vericose veins.   Pain Assessment Pain Assessment Pain Assessment: 0-10 Pain Score: 1  Pain Type: Chronic pain Pain Location: Tibia Pain Orientation: Right;Anterior;Medial;Mid  Wound Therapy Wound / Incision (Open or Dehisced) 09/10/14 Laceration Tibial Right;Medial;Anterior;Mid Rt LE swollen compared to Lt, Rt LE discolored, likely venous insufficency,  (Active)  Dressing Type ABD 09/13/2014 10:23 AM  Dressing Changed New 09/13/2014 10:23 AM  Dressing Status Clean 09/13/2014 10:23 AM  Dressing Change Frequency Every 3 days 09/13/2014 10:23 AM  Site / Wound Assessment Clean;Red 09/13/2014 10:23 AM  % Wound base Red or Granulating 100% 09/13/2014 10:23 AM  % Wound base Yellow 0% 09/13/2014 10:23 AM  % Wound base Black 0%  09/13/2014 10:23 AM  % Wound base Other (Comment) 0% 09/13/2014 10:23 AM  Peri-wound Assessment Intact;Maceration;Pink 09/13/2014 10:23 AM  Wound Length (cm) 2.1 cm 09/10/2014  7:25 PM  Wound Width (cm) 5.1 cm 09/10/2014  7:25 PM  Wound Depth (cm) 0.1 cm 09/10/2014  7:25 PM  Margins Unattached edges (unapproximated) 09/13/2014 10:23 AM  Closure None 09/13/2014 10:23 AM  Drainage Amount Minimal 09/13/2014 10:23 AM  Drainage Description No odor;Sanguineous 09/13/2014 10:23 AM  Non-staged Wound Description Partial thickness 09/13/2014 10:23 AM  Treatment Cleansed;Debridement (Selective) 09/13/2014 10:23 AM   Selective Debridement Selective Debridement - Location: Rt anterior medial shin Selective Debridement - Tools Used: Scalpel Selective Debridement - Tissue Removed: dead skin and, all of what minimal slough was present.    Physical Therapy Assessment and Plan Wound Therapy - Assess/Plan/Recommendations Wound Therapy - Clinical Statement: Patient displays wound on anterior Rt LE secondary with defcreased swelling followign application of profore wrap with noted improvement in bleeding. Noted conitnued swelling in Rt LE of unkown history but due to discoloration and history of vericose veins the swelling and discoloration is likely attributed to venous insufficiency. Wound dressed with honey and profore  to improve healing environment. Patient educated on continued  Wear of bandage and to not remove unless very painful Wound Therapy - Functional Problem List: difficulty walking secondary pain from wound Wound Plan: Use of honey and profore for dressing of wound to improve wound healing environment. Continue selective dridement as needed.       Goals Wound Therapy Goals - Improve the function of patient's integumentary  system by progressing the wound(s) through the phases of wound healing by: Decrease Necrotic Tissue to: 0% Decrease Necrotic Tissue - Progress: Goal set today Increase  Granulation Tissue to: 100% Increase Granulation Tissue - Progress: Goal set today Decrease Length/Width/Depth by (cm): 2.1x5.1 Decrease Length/Width/Depth - Progress: Goal set today Improve Drainage Characteristics: Mod Improve Drainage Characteristics - Progress: Goal set today Patient/Family will be able to : wear profore/compression garment without cuing between treatments.  Patient/Family Instruction Goal - Progress: Progressing toward goal Additional Wound Therapy Goal: Patient wll be able to walk wihout pain attributed to Rt LE wound Additional Wound Therapy Goal - Progress: Progressing toward goal  Problem List Patient Active Problem List   Diagnosis Date Noted  . Weakness of right leg 10/02/2013  . Difficulty in walking(719.7) 10/02/2013  . HTN (hypertension) 04/13/2013  . Hyperlipemia 04/13/2013  . Atrial fibrillation 02/14/2013  . Long term (current) use of anticoagulants 02/14/2013  . MYOCARDIAL INFARCTION 06/25/2008  . EXOGENOUS OBESITY 06/18/2008  . OBSTRUCTIVE SLEEP APNEA 06/18/2008    GP    Lianny Molter R 09/13/2014, 10:28 AM

## 2014-09-16 ENCOUNTER — Ambulatory Visit (HOSPITAL_COMMUNITY)
Admission: RE | Admit: 2014-09-16 | Discharge: 2014-09-16 | Disposition: A | Discharge status: Home / Self Care | Payer: Medicare Other | Source: Ambulatory Visit | Attending: Physician Assistant | Admitting: Physician Assistant

## 2014-09-16 DIAGNOSIS — Z5189 Encounter for other specified aftercare: Secondary | ICD-10-CM | POA: Diagnosis not present

## 2014-09-16 NOTE — Progress Notes (Signed)
Physical Therapy - Wound Therapy  Treatment   Patient Details  Name: Joel LeachRalph L Stutsman MRN: [id number] Date of Birth: 10/14/1951  Today's Date: 09/16/2014 Time: 0810-0850 Time Calculation (min): 40 min Charge: selective debridement <20 cm  Visit#: 3 of 8  Re-eval: 10/10/14  Subjective Subjective Assessment Subjective: Dressings are intact have slid down leg.  No reports of pain, just itchy.  Pain Assessment Pain Assessment Pain Assessment: 0-10  Wound Therapy  09/16/14 0857  Subjective Assessment  Subjective Dressings are intact have slid down leg.  No reports of pain, just itchy.  Wound / Incision (Open or Dehisced) 09/10/14 Laceration Tibial Right;Medial;Anterior;Mid Rt LE swollen compared to Lt, Rt LE discolored, likely venous insufficency,   Date First Assessed/Time First Assessed: 09/10/14 0930   Wound Type: Laceration  Location: Tibial  Location Orientation: Right;Medial;Anterior;Mid  Wound Description (Comments): Rt LE swollen compared to Lt, Rt LE discolored, likely venous insufficency,   Dressing Type Compression wrap (Medihoney and Profore)  Dressing Changed New  Dressing Status Clean;Dry;Intact  Site / Wound Assessment Clean;Red  % Wound base Red or Granulating 100%  % Wound base Yellow 0%  % Wound base Black 0%  Peri-wound Assessment Intact;Maceration;Pink  Margins Unattached edges (unapproximated)  Closure None  Drainage Amount Minimal  Drainage Description No odor;Sanguineous  Non-staged Wound Description Partial thickness  Treatment Cleansed;Debridement (Selective)  Selective Debridement  Selective Debridement - Location Rt anterior medial shin  Selective Debridement - Tools Used Forceps;Scalpel  Selective Debridement - Tissue Removed dead skin and, all of what minimal slough was present.   Wound Therapy - Assess/Plan/Recommendations  Wound Therapy - Clinical Statement Wound with min to moderate drainage.  Noted materated portion of distal wound.   Vaseline applied wound perimeter to keep skin intact.  Continued with honey and profore for edema control.  No reports of pain through session    Wound Therapy - Functional Problem List difficulty walking secondary pain from wound  Factors Delaying/Impairing Wound Healing Diabetes Mellitus;Vascular compromise  Hydrotherapy Plan Debridement;Dressing change;Patient/family education  Wound Plan Use of honey and profore for dressing of wound to improve wound healing environment. Continue selective dridement as needed.     Selective Debridement Selective Debridement - Location: Rt anterior medial shin Selective Debridement - Tools Used: Forceps;Scalpel Selective Debridement - Tissue Removed: dead skin and, all of what minimal slough was present.    Physical Therapy Assessment and Plan Wound Therapy - Assess/Plan/Recommendations Wound Therapy - Clinical Statement: Wound with min to moderate drainage.  Noted materated portion of distal wound.  Vaseline applied wound perimeter to keep skin intact.  Continued with honey and profore for edema control.  No reports of pain through session   Wound Therapy - Functional Problem List: difficulty walking secondary pain from wound Factors Delaying/Impairing Wound Healing: Diabetes Mellitus;Vascular compromise Hydrotherapy Plan: Debridement;Dressing change;Patient/family education Wound Plan: Use of honey and profore for dressing of wound to improve wound healing environment. Continue selective dridement as needed.       Goals    Problem List Patient Active Problem List   Diagnosis Date Noted  . Weakness of right leg 10/02/2013  . Difficulty in walking(719.7) 10/02/2013  . HTN (hypertension) 04/13/2013  . Hyperlipemia 04/13/2013  . Atrial fibrillation 02/14/2013  . Long term (current) use of anticoagulants 02/14/2013  . MYOCARDIAL INFARCTION 06/25/2008  . EXOGENOUS OBESITY 06/18/2008  . OBSTRUCTIVE SLEEP APNEA 06/18/2008    GP    Juel Burrowockerham,  Casey Jo 09/16/2014, 9:07 AM

## 2014-09-19 ENCOUNTER — Ambulatory Visit (HOSPITAL_COMMUNITY)
Admission: RE | Admit: 2014-09-19 | Discharge: 2014-09-19 | Disposition: A | Discharge status: Home / Self Care | Payer: Medicare Other | Source: Ambulatory Visit | Attending: Physician Assistant | Admitting: Physician Assistant

## 2014-09-19 DIAGNOSIS — Z5189 Encounter for other specified aftercare: Secondary | ICD-10-CM | POA: Diagnosis not present

## 2014-09-19 NOTE — Progress Notes (Signed)
Physical Therapy - Wound Therapy  Treatment   Patient Details  Name: Joel Rogers MRN: [id number] Date of Birth: 1951-03-30  Today's Date: 09/19/2014 Time: 6644-03470935-1005 Time Calculation (min): 30 min  Visit#: 4 of 8  Re-eval: 10/10/14  Subjective Subjective Assessment Subjective: Dressings are intact have slid down leg.  No reports of pain, just itchy.   Wound Therapy Wound / Incision (Open or Dehisced) 09/10/14 Laceration Tibial Right;Medial;Anterior;Mid Rt LE swollen compared to Lt, Rt LE discolored, likely venous insufficency,  (Active)  Dressing Type Impregnated gauze (bismuth), profore 09/19/2014 10:00 AM  Dressing Changed New 09/19/2014 10:00 AM  Dressing Status Clean;Dry;Intact 09/19/2014 10:00 AM  Dressing Change Frequency Every 3 days 09/13/2014 10:23 AM  Site / Wound Assessment Clean;Red 09/19/2014 10:00 AM  % Wound base Red or Granulating 100% 09/19/2014 10:00 AM  % Wound base Yellow 0% 09/19/2014 10:00 AM  % Wound base Black 0% 09/19/2014 10:00 AM  % Wound base Other (Comment) 0% 09/13/2014 10:23 AM  Peri-wound Assessment Intact;Maceration;Pink 09/19/2014 10:00 AM  Wound Length (cm) 2.1 cm 09/10/2014  7:25 PM  Wound Width (cm) 5.1 cm 09/10/2014  7:25 PM  Wound Depth (cm) 0.1 cm 09/10/2014  7:25 PM  Margins Unattached edges (unapproximated) 09/19/2014 10:00 AM  Closure None 09/19/2014 10:00 AM  Drainage Amount Minimal 09/19/2014 10:00 AM  Drainage Description No odor;Sanguineous 09/19/2014 10:00 AM  Non-staged Wound Description Partial thickness 09/19/2014 10:00 AM  Treatment Cleansed;Debridement (Selective) 09/19/2014 10:00 AM   Selective Debridement Selective Debridement - Location: Rt anterior medial shin and posterior calf Selective Debridement - Tools Used: Forceps Selective Debridement - Tissue Removed: dead skin and, all of what minimal slough was present.    Physical Therapy Assessment and Plan Wound Therapy - Assess/Plan/Recommendations Wound  Therapy - Clinical Statement: anterior wound macerated, changed dressings to xeroform.  Noted induration and upon discussion, patient states he has never been able to heal posterior wound and Rt LE has always been larger than Lt LE.  Also noted, vein stripping and total hip replacement on Rt LE. Pt with symptoms of Rt LE lymphedema.  Built up ankle to decrease bottlenecking in this region, promoting even compression.  discussed with evaluating therapist and order sent to MD to begin lymphedema treatment to promote healing of Rt LE wounds. Wound Therapy - Functional Problem List: difficulty walking secondary pain from wound Factors Delaying/Impairing Wound Healing: Diabetes Mellitus;Vascular compromise Hydrotherapy Plan: Debridement;Dressing change;Patient/family education Wound Plan: Lymphedema therapist to assess next visit; begin lymph therapy in adjunct to wound treatment if indicated.    Assess posterior wound next visit with measurements; add LDA.       Problem List Patient Active Problem List   Diagnosis Date Noted  . Weakness of right leg 10/02/2013  . Difficulty in walking(719.7) 10/02/2013  . HTN (hypertension) 04/13/2013  . Hyperlipemia 04/13/2013  . Atrial fibrillation 02/14/2013  . Long term (current) use of anticoagulants 02/14/2013  . MYOCARDIAL INFARCTION 06/25/2008  . EXOGENOUS OBESITY 06/18/2008  . OBSTRUCTIVE SLEEP APNEA 06/18/2008     Lurena NidaAmy B Vasilia Dise, PTA/CLT 09/19/2014, 10:24 AM

## 2014-09-19 NOTE — Progress Notes (Signed)
Physical Therapy - Wound Therapy  Treatment   Patient Details  Name: Joel LeachRalph L Rogers MRN: [id number] Date of Birth: 1951/03/05  Today's Date: 09/19/2014 Time: 4098-11910935-1005 Time Calculation (min): 30 min  Visit#: 4 of 8  Re-eval: 10/10/14  Subjective Subjective Assessment Subjective: Dressings are intact have slid down leg.  No reports of pain, just itchy.   Wound Therapy Wound / Incision (Open or Dehisced) 09/10/14 Laceration Tibial Right;Medial;Anterior;Mid Rt LE swollen compared to Lt, Rt LE discolored, likely venous insufficency,  (Active)  Dressing Type Impregnated gauze (bismuth), profore 09/19/2014 10:00 AM  Dressing Changed New 09/19/2014 10:00 AM  Dressing Status Clean;Dry;Intact 09/19/2014 10:00 AM  Dressing Change Frequency Every 3 days 09/13/2014 10:23 AM  Site / Wound Assessment Clean;Red 09/19/2014 10:00 AM  % Wound base Red or Granulating 100% 09/19/2014 10:00 AM  % Wound base Yellow 0% 09/19/2014 10:00 AM  % Wound base Black 0% 09/19/2014 10:00 AM  % Wound base Other (Comment) 0% 09/13/2014 10:23 AM  Peri-wound Assessment Intact;Maceration;Pink 09/19/2014 10:00 AM  Wound Length (cm) 2.1 cm 09/10/2014  7:25 PM  Wound Width (cm) 5.1 cm 09/10/2014  7:25 PM  Wound Depth (cm) 0.1 cm 09/10/2014  7:25 PM  Margins Unattached edges (unapproximated) 09/19/2014 10:00 AM  Closure None 09/19/2014 10:00 AM  Drainage Amount Minimal 09/19/2014 10:00 AM  Drainage Description No odor;Sanguineous 09/19/2014 10:00 AM  Non-staged Wound Description Partial thickness 09/19/2014 10:00 AM  Treatment Cleansed;Debridement (Selective) 09/19/2014 10:00 AM   Selective Debridement Selective Debridement - Location: Rt anterior medial shin and posterior calf Selective Debridement - Tools Used: Forceps Selective Debridement - Tissue Removed: dead skin and, all of what minimal slough was present.    Physical Therapy Assessment and Plan Wound Therapy - Assess/Plan/Recommendations Wound  Therapy - Clinical Statement: anterior wound macerated, changed dressings to xeroform.  Noted induration and upon discussion, patient states he has never been able to heal posterior wound and Rt LE has always been larger than Lt LE.  Also noted, vein stripping and total hip replacement on Rt LE. Pt with symptoms of Rt LE lymphedema.  Built up ankle to decrease bottlenecking in this region, promoting even compression.  discussed with evaluating therapist and order sent to MD to begin lymphedema treatment to promote healing of Rt LE wounds. Wound Therapy - Functional Problem List: difficulty walking secondary pain from wound Factors Delaying/Impairing Wound Healing: Diabetes Mellitus;Vascular compromise Hydrotherapy Plan: Debridement;Dressing change;Patient/family education Wound Plan: Lymphedema therapist to assess next visit; begin lymph therapy in adjunct to wound treatment if indicated.    Assess posterior wound next visit with measurements; add LDA.       Problem List Patient Active Problem List   Diagnosis Date Noted  . Weakness of right leg 10/02/2013  . Difficulty in walking(719.7) 10/02/2013  . HTN (hypertension) 04/13/2013  . Hyperlipemia 04/13/2013  . Atrial fibrillation 02/14/2013  . Long term (current) use of anticoagulants 02/14/2013  . MYOCARDIAL INFARCTION 06/25/2008  . EXOGENOUS OBESITY 06/18/2008  . OBSTRUCTIVE SLEEP APNEA 06/18/2008     Lurena NidaAmy B Frazier, PTA/CLT 09/19/2014, 10:24 AM  Jerilee Fieldash Hamish Banks PT DPT

## 2014-09-23 ENCOUNTER — Other Ambulatory Visit: Payer: Self-pay | Admitting: Pharmacist Clinician (PhC)/ Clinical Pharmacy Specialist

## 2014-09-24 ENCOUNTER — Ambulatory Visit (INDEPENDENT_AMBULATORY_CARE_PROVIDER_SITE_OTHER): Payer: Medicare Other | Admitting: Pharmacist Clinician (PhC)/ Clinical Pharmacy Specialist

## 2014-09-24 ENCOUNTER — Ambulatory Visit (HOSPITAL_COMMUNITY)
Admission: RE | Admit: 2014-09-24 | Discharge: 2014-09-24 | Disposition: A | Discharge status: Home / Self Care | Payer: Medicare Other | Source: Ambulatory Visit | Attending: Physician Assistant | Admitting: Physician Assistant

## 2014-09-24 ENCOUNTER — Encounter (HOSPITAL_COMMUNITY): Payer: Self-pay | Admitting: Physical Therapy

## 2014-09-24 DIAGNOSIS — R262 Difficulty in walking, not elsewhere classified: Secondary | ICD-10-CM | POA: Insufficient documentation

## 2014-09-24 DIAGNOSIS — I83013 Varicose veins of right lower extremity with ulcer of ankle: Secondary | ICD-10-CM | POA: Diagnosis not present

## 2014-09-24 DIAGNOSIS — T148XXA Other injury of unspecified body region, initial encounter: Secondary | ICD-10-CM

## 2014-09-24 DIAGNOSIS — Z5189 Encounter for other specified aftercare: Secondary | ICD-10-CM | POA: Insufficient documentation

## 2014-09-24 LAB — PROTIME-INR
INR: 2.42 — ABNORMAL HIGH (ref <1.50)
Prothrombin Time: 26.3 seconds — ABNORMAL HIGH (ref 11.6–15.2)

## 2014-09-24 NOTE — Therapy (Addendum)
Physical Therapy Treatment  Patient Details  Name: Joel Rogers MRN: [id number] Date of Birth: 06-25-1951  Encounter Date: 09/24/2014      PT End of Session - 09/30/14 1102    Visit Number 8   Number of Visits 8   Date for PT Re-Evaluation 10/10/14   Authorization Type [employer] medicare   Authorization - Visit Number 8   Authorization - Number of Visits 8   PT Start Time [phone]   PT Stop Time 1022   PT Time Calculation (min) 40 min   PT Start Time (ACUTE ONLY) 0942   PT Stop Time (ACUTE ONLY) 1022   PT Time Calculation (min) (ACUTE ONLY) 40 min   PT Charge Details deb<20cm      Past Medical History  Diagnosis Date  . Atrial fibrillation   . Sleep apnea     on CPAP  . Hypertension   . Hyperlipidemia   . Obesity   . Venous insufficiency   . Normal coronary arteries     by cardiac catheterization performed 03/14/06    Past Surgical History  Procedure Laterality Date  . Gastric bypass  09/2008  . Total hip arthroplasty      right hip    There were no vitals taken for this visit.  Pt states that he is not having any pain    09/24/14 1200  Wound / Incision (Open or Dehisced) 09/24/14 Other (Comment) Leg Right;Posterior  Date First Assessed: 09/24/14   Wound Type: Other (Comment)  Location: Leg  Location Orientation: Right;Posterior  Dressing Type Compression wrap;Impregnated gauze (bismuth)  Dressing Changed Changed  Dressing Status Old drainage  Dressing Change Frequency Every 3 days  Site / Wound Assessment Clean;Red  % Wound base Red or Granulating 100%  % Wound base Yellow 0%  Peri-wound Assessment Intact  Wound Length (cm) 0.5 cm  Wound Width (cm) 0.5 cm  Drainage Amount Scant  Drainage Description Serosanguineous  Treatment Cleansed;Other (Comment)  Wound / Incision (Open or Dehisced)  Date First Assessed/Time First Assessed: 09/10/14 0930   Wound Type: Laceration  Location: Tibial  Location Orientation: Right;Medial;Anterior;Mid  Wound Description  (Comments): Rt LE swollen compared to Lt, Rt LE discolored, likely venous insufficency,   Dressing Type Impregnated gauze (bismuth);Compression wrap  Dressing Changed Changed  Dressing Status Clean  Site / Wound Assessment Clean;Red  % Wound base Red or Granulating 100%  % Wound base Yellow 0%  % Wound base Black 0%  Peri-wound Assessment Intact  Wound Length (cm) 3.5 cm  Wound Width (cm) 2 cm  Wound Depth (cm) 0 cm  Margins Unattached edges (unapproximated)  Closure None  Drainage Amount Scant  Drainage Description Serosanguineous  Non-staged Wound Description Partial thickness  Treatment Cleansed;Other (Comment)  Wound Therapy - Assess/Plan/Recommendations  Wound Therapy - Clinical Statement Pt swelling in LE is minimal at this time.  Pt states that his leg is the smallest it has ever been  Wound Therapy - Functional Problem List no difficulty walking any longer.  Factors Delaying/Impairing Wound Healing Diabetes Mellitus;Vascular compromise  Wound Plan continue manual to increase lymph circulation to decrease time of healing.                      Pt received manual decongestive techniques to improve lymph flow to decrease wound healing time.  Decongestive techniques includes supraclavicular, deep and superficial abdominal followed by routing fluid from Rt inguinal to Rt axillary using inguinal/axillary anastomosis.  LE from hip to  knee was completed as well.  PT then wrapped using multilayer profore bandage system.  Visit Diagnosis:  Nonhealing nonsurgical wound                  Plan - 09/24/14 1210    Clinical Impression Statement Added manual lymph massage to patient to improve lymph circulation and decrease healing time.  Pt had second wound on posterior aspect of LE which was measured.    PT Plan continue with manual for lymphedema as well as compression wrapping with profore.  Pt to aquire compression hose once wounds are healed.            G-Codes - October 29, 2014 1058    Functional Limitation Mobility: Walking and moving around   Mobility: Walking and Moving Around Current Status [phone]) At least 20 percent but less than 40 percent impaired, limited or restricted   Mobility: Walking and Moving Around Goal Status [phone]) At least 20 percent but less than 40 percent impaired, limited or restricted      Problem List Patient Active Problem List   Diagnosis Date Noted  . Weakness of right leg 10/02/2013  . Difficulty in walking(719.7) 10/02/2013  . HTN (hypertension) 04/13/2013  . Hyperlipemia 04/13/2013  . Atrial fibrillation 02/14/2013  . Long term (current) use of anticoagulants 02/14/2013  . MYOCARDIAL INFARCTION 06/25/2008  . EXOGENOUS OBESITY 06/18/2008  . OBSTRUCTIVE SLEEP APNEA 06/18/2008                      Wound Therapy - 09/30/14 1035    Subjective Pt reports his Rt LE feels much better and is no longer sensitive like it was.  Currently reports no pain but itchy.   Pain Assessment No/denies pain   Wound Properties Date First Assessed: 09/24/14 Wound Type: Other (Comment) Location: Leg Location Orientation: Right;Posterior   Dressing Type Compression wrap;Impregnated gauze (bismuth)  Profore   Dressing Changed Changed   Dressing Status Clean   Dressing Change Frequency Every 3 days   Site / Wound Assessment Clean;Red   % Wound base Red or Granulating 100%   % Wound base Yellow 0%   Peri-wound Assessment Intact   Wound Length (cm) 1 cm  was 0.5 cm   Wound Width (cm) 0.5 cm  was 0.5 cm   Wound Depth (cm) 0 cm   Drainage Amount Scant   Drainage Description Serosanguineous   Non-staged Wound Description Not applicable   Treatment Cleansed;Debridement (Selective)   Wound Properties Date First Assessed: 09/10/14 Time First Assessed: 0930 Wound Type: Laceration Location: Tibial Location Orientation: Right;Medial;Anterior;Mid Wound Description (Comments): Rt LE swollen compared to Lt, Rt LE  discolored, likely venous insufficency,  Present on Admission: Yes   Dressing Type Impregnated gauze (bismuth);Compression wrap  profore   Dressing Changed Changed   Dressing Status Clean   Site / Wound Assessment Other (Comment)  wound now healed place xeroform due to fragility.   % Wound base Red or Granulating 100%   % Wound base Yellow 0%   % Wound base Black 0%   Peri-wound Assessment Intact   Wound Length (cm) 0.5 cm  was 3.5 cm   Wound Width (cm) 0.2 cm  was 2 cm   Wound Depth (cm) 0 cm   Margins Unattached edges (unapproximated)   Closure None   Drainage Amount Scant   Drainage Description Serosanguineous   Non-staged Wound Description Partial thickness   Treatment Cleansed;Debridement (Selective)   Wound Therapy - Clinical Statement Anterior Rt  LE wound nearly healed.  Posterior wound is slightly larger in length than last measurement but suspect due to removal of dry skin at perimeter.   Required debridment to perimeter of both wounds to remove dry skin to promote approximation.  Manual was not completed today as pateint was late for appointment.  Pt instructed to remove bandages morining of his next appointment and go to get Rt LE measured prior to therapy in case stockings have to be custom.  Noted reduction of induration and edema in Rt LE.  Pt is progressing well toward goals.  Anticipate discharge by end of next week.  i   Factors Delaying/Impairing Wound Healing Vascular compromise   Hydrotherapy Plan Debridement;Dressing change   Wound Therapy - Frequency Other (comment)  2X week   Wound Therapy - Current Recommendations Other (comment)  compression garments   Wound Plan See until posterior wound is healed then discharge with emphasis on wearing compression garments everyday.    Manual Therapy --   Decrease Necrotic Tissue to 0   Decrease Necrotic Tissue - Progress Met   Increase Granulation Tissue to 100   Increase Granulation Tissue - Progress Met   Decrease  Length/Width/Depth by (cm) 2.1X5.1   Decrease Length/Width/Depth - Progress Met   Improve Drainage Characteristics Mod   Improve Drainage Characteristics - Progress Met   Patient/Family will be able to  wear profore/compression garment without cueing between treatments   Patient/Family Instruction Goal - Progress Progressing toward goal  pt to get fitted for compression garment this week.   Additional Wound Therapy Goal Pt will be able to ambulate without pain associated with Rt LE wounds   Additional Wound Therapy Goal - Progress Met                              Kai Railsback,CINDY 10/01/2014, 2:28 PM

## 2014-09-25 ENCOUNTER — Other Ambulatory Visit: Payer: Self-pay | Admitting: Pharmacist Clinician (PhC)/ Clinical Pharmacy Specialist

## 2014-09-27 ENCOUNTER — Ambulatory Visit (HOSPITAL_COMMUNITY)
Admission: RE | Admit: 2014-09-27 | Discharge: 2014-09-27 | Disposition: A | Discharge status: Home / Self Care | Payer: Medicare Other | Source: Ambulatory Visit | Attending: Physician Assistant | Admitting: Physician Assistant

## 2014-09-27 ENCOUNTER — Encounter (HOSPITAL_COMMUNITY): Payer: Self-pay | Admitting: Physical Therapy

## 2014-09-27 DIAGNOSIS — T148XXA Other injury of unspecified body region, initial encounter: Secondary | ICD-10-CM

## 2014-09-27 DIAGNOSIS — Z5189 Encounter for other specified aftercare: Secondary | ICD-10-CM | POA: Diagnosis not present

## 2014-09-27 NOTE — Therapy (Addendum)
Physical Therapy Treatment  Patient Details  Name: Claudie LeachRalph L Satter MRN: [id number] Date of Birth: [birth year]  Encounter Date: 09/27/2014      PT End of Session - 09/27/14 1618    Visit Number 7   Number of Visits 8   Date for PT Re-Evaluation 10/10/14   Authorization Type [employer] medicare   Authorization - Visit Number 7   Authorization - Number of Visits 8   PT Start Time 1150   PT Stop Time 1200   PT Time Calculation (min) 10 min      Past Medical History  Diagnosis Date  . Atrial fibrillation   . Sleep apnea     on CPAP  . Hypertension   . Hyperlipidemia   . Obesity   . Venous insufficiency   . Normal coronary arteries     by cardiac catheterization performed 03/14/06    Past Surgical History  Procedure Laterality Date  . Gastric bypass  09/2008  . Total hip arthroplasty      right hip    There were no vitals taken for this visit.  Visit Diagnosis:  No diagnosis found.         Problem List Patient Active Problem List   Diagnosis Date Noted  . Weakness of right leg 10/02/2013  . Difficulty in walking(719.7) 10/02/2013  . HTN (hypertension) 04/13/2013  . Hyperlipemia 04/13/2013  . Atrial fibrillation 02/14/2013  . Long term (current) use of anticoagulants 02/14/2013  . MYOCARDIAL INFARCTION 06/25/2008  . EXOGENOUS OBESITY 06/18/2008  . OBSTRUCTIVE SLEEP APNEA 06/18/2008           Wound Therapy - 09/27/14 1609    Subjective Pt states that his leg is itchy but he can tell that the swelling has gone down even further.    Pain Assessment No/denies pain   Wound Properties Date First Assessed: 09/24/14 Wound Type: Other (Comment) Location: Leg Location Orientation: Right;Posterior   Dressing Type Compression wrap;Impregnated gauze (bismuth)   Dressing Changed Changed   Dressing Status Clean   Dressing Change Frequency Every 3 days   Site / Wound Assessment Clean;Red   % Wound base Red or Granulating 100%   % Wound base Yellow 0%   Peri-wound  Assessment Intact   Drainage Amount Scant   Drainage Description Serosanguineous   Treatment Cleansed   Wound Properties Date First Assessed: 09/10/14 Time First Assessed: 0930 Wound Type: Laceration Location: Tibial Location Orientation: Right;Medial;Anterior;Mid Wound Description (Comments): Rt LE swollen compared to Lt, Rt LE discolored, likely venous insufficency,  Present on Admission: Yes   Dressing Type Impregnated gauze (bismuth);Compression wrap   Dressing Changed Changed   Dressing Status Clean   Site / Wound Assessment Other (Comment)  wound now healed place xeroform due to fragility.   Wound Therapy - Clinical Statement Wound on medial aspect is now healed.  Pt given prescription for comprssion garment.  Anticipate discharge next week.    Wound Plan See until posterior wound is healed then discharge with emphasis on wearing compression garments everyday.    Manual Therapy manual decongestive techniques including supraclavicular, deep and superficial abdominal, routing fluid using inguinal/axillary anastomosis folllowed by LE.          Donnamae JudeCindy Russell PT/CLT   09/27/2014, 4:19 PM

## 2014-09-30 ENCOUNTER — Ambulatory Visit (HOSPITAL_COMMUNITY)
Admission: RE | Admit: 2014-09-30 | Discharge: 2014-09-30 | Disposition: A | Discharge status: Home / Self Care | Payer: Medicare Other | Source: Ambulatory Visit | Attending: Physician Assistant | Admitting: Physician Assistant

## 2014-09-30 DIAGNOSIS — Z5189 Encounter for other specified aftercare: Secondary | ICD-10-CM | POA: Diagnosis not present

## 2014-09-30 DIAGNOSIS — T148XXA Other injury of unspecified body region, initial encounter: Secondary | ICD-10-CM

## 2014-09-30 NOTE — Therapy (Signed)
Physical Therapy Wound Re-evaluation  Patient Details  Name: Joel Rogers MRN: [id number] Date of Birth: Apr 18, 1951 Encounter Date: 09/30/2014      PT End of Session - 09/30/14 1102    Visit Number 8   Number of Visits 8   Date for PT Re-Evaluation 10/10/14   Authorization Type [employer] medicare   Authorization - Visit Number 8   Authorization - Number of Visits 8   PT Start Time [phone]   PT Stop Time 1022   PT Time Calculation (min) 40 min   PT Start Time (ACUTE ONLY) 0942   PT Stop Time (ACUTE ONLY) 1022   PT Time Calculation (min) (ACUTE ONLY) 40 min   PT Charge Details deb<20cm      Past Medical History  Diagnosis Date  . Atrial fibrillation   . Sleep apnea     on CPAP  . Hypertension   . Hyperlipidemia   . Obesity   . Venous insufficiency   . Normal coronary arteries     by cardiac catheterization performed 03/14/06    Past Surgical History  Procedure Laterality Date  . Gastric bypass  09/2008  . Total hip arthroplasty      right hip    There were no vitals taken for this visit.  Visit Diagnosis:  Nonhealing nonsurgical wound       G-Codes - October 24, 2014 1050    Functional Assessment Tool Used clinical judgement   Functional Limitation Mobility: Walking and moving around   Mobility: Walking and Moving Around Current Status [phone]) At least 20 percent but less than 40 percent impaired, limited or restricted   Mobility: Walking and Moving Around Goal Status [phone]) At least 20 percent but less than 40 percent impaired, limited or restricted      Problem List Patient Active Problem List   Diagnosis Date Noted  . Weakness of right leg 10/02/2013  . Difficulty in walking(719.7) 10/02/2013  . HTN (hypertension) 04/13/2013  . Hyperlipemia 04/13/2013  . Atrial fibrillation 02/14/2013  . Long term (current) use of anticoagulants 02/14/2013  . MYOCARDIAL INFARCTION 06/25/2008  . EXOGENOUS OBESITY 06/18/2008  . OBSTRUCTIVE SLEEP APNEA 06/18/2008            Wound Therapy - 09/30/14 1035    Subjective Pt reports his Rt LE feels much better and is no longer sensitive like it was.  Currently reports no pain but itchy.   Pain Assessment No/denies pain   Wound Properties Date First Assessed: 09/24/14 Wound Type: Other (Comment) Location: Leg Location Orientation: Right;Posterior   Dressing Type Compression wrap;Impregnated gauze (bismuth)  Profore   Dressing Changed Changed   Dressing Status Clean   Dressing Change Frequency Every 3 days   Site / Wound Assessment Clean;Red   % Wound base Red or Granulating 100%   % Wound base Yellow 0%   Peri-wound Assessment Intact   Wound Length (cm) 1 cm  was 0.5 cm   Wound Width (cm) 0.5 cm  was 0.5 cm   Wound Depth (cm) 0 cm   Drainage Amount Scant   Drainage Description Serosanguineous   Non-staged Wound Description Not applicable   Treatment Cleansed;Debridement (Selective)   Wound Properties Date First Assessed: 09/10/14 Time First Assessed: 0930 Wound Type: Laceration Location: Tibial Location Orientation: Right;Medial;Anterior;Mid Wound Description (Comments): Rt LE swollen compared to Lt, Rt LE discolored, likely venous insufficency,  Present on Admission: Yes   Dressing Type Impregnated gauze (bismuth);Compression wrap  profore   Dressing Changed Changed  Dressing Status Clean   Site / Wound Assessment Other (Comment)  wound now healed place xeroform due to fragility.   % Wound base Red or Granulating 100%   % Wound base Yellow 0%   % Wound base Black 0%   Peri-wound Assessment Intact   Wound Length (cm) 0.5 cm  was 3.5 cm   Wound Width (cm) 0.2 cm  was 2 cm   Wound Depth (cm) 0 cm   Margins Unattached edges (unapproximated)   Closure None   Drainage Amount Scant   Drainage Description Serosanguineous   Non-staged Wound Description Partial thickness   Treatment Cleansed;Debridement (Selective)   Wound Therapy - Clinical Statement Anterior Rt LE wound nearly healed.  Posterior wound  is slightly larger in length than last measurement but suspect due to removal of dry skin at perimeter.   Required debridment to perimeter of both wounds to remove dry skin to promote approximation.  Manual was not completed today as pateint was late for appointment.  Pt instructed to remove bandages morining of his next appointment and go to get Rt LE measured prior to therapy in case stockings have to be custom.  Noted reduction of induration and edema in Rt LE.  Pt is progressing well toward goals.  Anticipate discharge by end of next week.  i   Factors Delaying/Impairing Wound Healing Vascular compromise   Hydrotherapy Plan Debridement;Dressing change   Wound Therapy - Frequency Other (comment)  2X week   Wound Therapy - Current Recommendations Other (comment)  compression garments   Wound Plan See until posterior wound is healed then discharge with emphasis on wearing compression garments everyday.    Manual Therapy --   Decrease Necrotic Tissue to 0   Decrease Necrotic Tissue - Progress Met   Increase Granulation Tissue to 100   Increase Granulation Tissue - Progress Met   Decrease Length/Width/Depth by (cm) 2.1X5.1   Decrease Length/Width/Depth - Progress Met   Improve Drainage Characteristics Mod   Improve Drainage Characteristics - Progress Met   Patient/Family will be able to  wear profore/compression garment without cueing between treatments   Patient/Family Instruction Goal - Progress Progressing toward goal  pt to get fitted for compression garment this week.   Additional Wound Therapy Goal Pt will be able to ambulate without pain associated with Rt LE wounds   Additional Wound Therapy Goal - Progress Met          Teena Irani, PTA/CLT 10/01/2014, 10:53 AM

## 2014-10-01 NOTE — Addendum Note (Signed)
Encounter addended by: Bella Kennedyynthia J Mahmood Boehringer, PT on: 10/01/2014  2:29 PM<BR>     Documentation filed: Clinical Notes, Flowsheet VN

## 2014-10-03 ENCOUNTER — Encounter (HOSPITAL_COMMUNITY): Payer: Self-pay | Admitting: Physical Therapy

## 2014-10-03 ENCOUNTER — Ambulatory Visit (HOSPITAL_COMMUNITY): Payer: Medicare Other | Admitting: Physical Therapy

## 2014-10-03 ENCOUNTER — Ambulatory Visit (HOSPITAL_COMMUNITY)
Admission: RE | Admit: 2014-10-03 | Discharge: 2014-10-03 | Disposition: A | Discharge status: Home / Self Care | Payer: Medicare Other | Source: Ambulatory Visit | Attending: Physician Assistant | Admitting: Physician Assistant

## 2014-10-03 DIAGNOSIS — Z5189 Encounter for other specified aftercare: Secondary | ICD-10-CM | POA: Diagnosis not present

## 2014-10-03 DIAGNOSIS — R29898 Other symptoms and signs involving the musculoskeletal system: Secondary | ICD-10-CM

## 2014-10-03 DIAGNOSIS — T148XXA Other injury of unspecified body region, initial encounter: Secondary | ICD-10-CM

## 2014-10-03 NOTE — Therapy (Signed)
Wound Care Therapy  Patient Details  Name: Joel Rogers MRN: [id number] Date of Birth: May 11, 1951  Encounter Date: 10/03/2014      PT End of Session - 10/03/14 1259    Visit Number 9   Number of Visits 9   Date for PT Re-Evaluation 10/10/14   Authorization Type [employer] medicare   Authorization - Visit Number 9   Authorization - Number of Visits 9   PT Start Time 1020   PT Stop Time 1100   PT Time Calculation (min) 40 min   Activity Tolerance Patient tolerated treatment well      Past Medical History  Diagnosis Date  . Atrial fibrillation   . Sleep apnea     on CPAP  . Hypertension   . Hyperlipidemia   . Obesity   . Venous insufficiency   . Normal coronary arteries     by cardiac catheterization performed 03/14/06    Past Surgical History  Procedure Laterality Date  . Gastric bypass  09/2008  . Total hip arthroplasty      right hip    There were no vitals taken for this visit.  Visit Diagnosis:  Nonhealing nonsurgical wound  Weakness of right leg      Subjective Assessment - 10/03/14 1100    Symptoms Pt states that the dressing was comfortableuntil last 24 hours when he noticed it wrinkles. States he has not gone to be measured for compression garments.    Currently in Pain? No/denies       Problem List Patient Active Problem List   Diagnosis Date Noted  . Weakness of right leg 10/02/2013  . Difficulty in walking(719.7) 10/02/2013  . HTN (hypertension) 04/13/2013  . Hyperlipemia 04/13/2013  . Atrial fibrillation 02/14/2013  . Long term (current) use of anticoagulants 02/14/2013  . MYOCARDIAL INFARCTION 06/25/2008  . EXOGENOUS OBESITY 06/18/2008  . OBSTRUCTIVE SLEEP APNEA 06/18/2008         Wound Therapy - 10/03/14 1258    Subjective Pt reports his Rt LE feels much better and is no longer sensitive like it was.  Currently reports no pain but itchy.. States he has not had measurements yet for compression garments.    Wound Properties Date First  Assessed: 09/24/14 Wound Type: Other (Comment) Location: Leg Location Orientation: Right;Posterior   Dressing Type Compression wrap;Impregnated gauze (bismuth)  Profore   Dressing Status Clean   Dressing Change Frequency Every 3 days   Site / Wound Assessment Clean;Red   % Wound base Red or Granulating 100%   % Wound base Yellow 0%   Peri-wound Assessment Intact   Drainage Amount Scant   Drainage Description Serosanguineous   Non-staged Wound Description Not applicable   Wound Properties Date First Assessed: 09/10/14 Time First Assessed: 0930 Wound Type: Laceration Location: Tibial Location Orientation: Right;Medial;Anterior;Mid Wound Description (Comments): Rt LE swollen compared to Lt, Rt LE discolored, likely venous insufficency,  Present on Admission: Yes   Dressing Type Impregnated gauze (bismuth);Compression wrap  profore   Dressing Status Clean   Site / Wound Assessment Other (Comment)  wound now healed place xeroform due to fragility.   % Wound base Red or Granulating 100%   % Wound base Yellow 0%   % Wound base Black 0%   Peri-wound Assessment Intact   Margins Unattached edges (unapproximated)   Closure None   Drainage Amount Scant   Drainage Description Serosanguineous   Non-staged Wound Description Partial thickness   Wound Therapy - Clinical Statement Anterior Rt LE wound  nearly healed. Required minimal debridement to perimeter of both wounds to remove dry skin to promote approximation. Patient has not yet seen consultant for measurements for compression garments. Pt again instructed to remove bandages morning of his next appointment and go to get Rt LE measured prior to therapy in case stockings have to be custom. Noted reduction of induration and edema in Rt LE. Pt is progressing well toward goals. Anticipate discharge next session as wounds have nearly fully healed.    Factors Delaying/Impairing Wound Healing Vascular compromise   Hydrotherapy Plan Debridement;Dressing  change   Wound Therapy - Frequency Other (comment)  2X week   Wound Therapy - Current Recommendations Other (comment)  compression garments   Wound Plan See until posterior wound is healed then discharge with emphasis on wearing compression garments everyday.    Decrease Necrotic Tissue to 0   Increase Granulation Tissue to 100   Decrease Length/Width/Depth by (cm) 2.1X5.1   Improve Drainage Characteristics Mod   Patient/Family will be able to  wear profore/compression garment without cueing between treatments   Additional Wound Therapy Goal Pt will be able to ambulate without pain associated with Rt LE wounds         Tenecia Ignasiak R 10/03/2014, 1:04 PM

## 2014-10-07 ENCOUNTER — Ambulatory Visit (HOSPITAL_COMMUNITY): Payer: Medicare Other | Admitting: Physical Therapy

## 2014-10-07 ENCOUNTER — Ambulatory Visit (HOSPITAL_COMMUNITY)
Admission: RE | Admit: 2014-10-07 | Discharge: 2014-10-07 | Disposition: A | Discharge status: Home / Self Care | Payer: Medicare Other | Source: Ambulatory Visit | Attending: Family Medicine | Admitting: Family Medicine

## 2014-10-07 DIAGNOSIS — T148XXA Other injury of unspecified body region, initial encounter: Secondary | ICD-10-CM

## 2014-10-07 DIAGNOSIS — Z5189 Encounter for other specified aftercare: Secondary | ICD-10-CM | POA: Diagnosis not present

## 2014-10-07 NOTE — Therapy (Signed)
Wound Care Therapy  Patient Details  Name: Claudie LeachRalph L Hott MRN: [id number] Date of Birth: 02-20-1951  Encounter Date: 10/07/2014      PT End of Session - 10/07/14 1659    Visit Number 10   Number of Visits 16   Date for PT Re-Evaluation 10/10/14   Authorization Type [employer] medicare   Authorization - Visit Number 10   Authorization - Number of Visits 16   PT Start Time 1607   PT Stop Time 1645   PT Time Calculation (min) 38 min      Past Medical History  Diagnosis Date  . Atrial fibrillation   . Sleep apnea     on CPAP  . Hypertension   . Hyperlipidemia   . Obesity   . Venous insufficiency   . Normal coronary arteries     by cardiac catheterization performed 03/14/06    Past Surgical History  Procedure Laterality Date  . Gastric bypass  09/2008  . Total hip arthroplasty      right hip    There were no vitals taken for this visit.  Visit Diagnosis:  No diagnosis found.                  Plan - 10/07/14 1705    PT Next Visit Plan assess if wounds are healed if wounds are healed and pt is able to fit into compression garment pt may be discharged if not continue two times a week for 3 weeks or until wound is healed.  reassess next visit           G-Codes - 10/07/14 1704    Functional Assessment Tool Used clinical judgement   Functional Limitation Mobility: Walking and moving around   Mobility: Walking and Moving Around Current Status [phone]([id number]) At least 20 percent but less than 40 percent impaired, limited or restricted   Mobility: Walking and Moving Around Goal Status [phone](G8979) At least 20 percent but less than 40 percent impaired, limited or restricted      Problem List Patient Active Problem List   Diagnosis Date Noted  . Weakness of right leg 10/02/2013  . Difficulty in walking(719.7) 10/02/2013  . HTN (hypertension) 04/13/2013  . Hyperlipemia 04/13/2013  . Atrial fibrillation 02/14/2013  . Long term (current) use of anticoagulants 02/14/2013   . MYOCARDIAL INFARCTION 06/25/2008  . EXOGENOUS OBESITY 06/18/2008  . OBSTRUCTIVE SLEEP APNEA 06/18/2008              Wound Therapy - 10/07/14 1649    Subjective Pt states that he took his compression off Friday thinking he was going to be measured and given compression stockings at [facility] who refused to do so but gave him two places in [place] where he could aquire the hose.  Over the weekend without the compession his leg has swollen back up and now he can not get compression socks donned.  [facility] fitted and gave pt garment but stated he would need to get his leg down first prior to being able to don socks.     Pain Assessment No/denies pain   Wound Properties Date First Assessed: 09/24/14 Wound Type: Other (Comment) Location: Leg Location Orientation: Right;Posterior   Dressing Type Impregnated gauze (bismuth);Compression wrap   Dressing Changed Changed  pt had placed dressing over wound site.   Dressing Status Clean   Dressing Change Frequency Every 3 days   % Wound base Red or Granulating 100%   % Wound base Yellow 0%  Peri-wound Assessment Intact   Wound Length (cm) 0.4 cm   Wound Width (cm) 0.4 cm   Drainage Amount Scant   Drainage Description Serous   Treatment Cleansed;Other (Comment)   Wound Properties Date First Assessed: 09/10/14 Time First Assessed: 0930 Wound Type: Laceration Location: Tibial Location Orientation: Right;Medial;Anterior;Mid Wound Description (Comments): Rt LE swollen compared to Lt, Rt LE discolored, likely venous insufficency,  Present on Admission: Yes   Dressing Type Impregnated gauze (bismuth);Compression wrap   Dressing Status Clean   % Wound base Red or Granulating 100%   % Wound base Yellow 0%   Wound Length (cm) 0.5 cm   Wound Width (cm) 0.2 cm   Margins Unattached edges (unapproximated)   Closure None   Drainage Amount Scant   Treatment Cleansed;Other (Comment)   Factors Delaying/Impairing Wound  Healing Diabetes Mellitus;Vascular compromise   Hydrotherapy Plan Other (comment)  manual to get fluid down then place in compression stocking   Wound Plan Pt will need to be seen until swelling has gone down to where he can done compression garment   Manual Therapy manual lymph draining to include supraclavicular, deep and suprificial abdominal as well as routing fluid via inguinal-axillary anastomsis as well as LE followed by multilayer compression dressing (profore)            RUSSELL,CINDY PT/CLT 10/07/2014, 5:06 PM

## 2014-10-09 NOTE — Addendum Note (Signed)
Encounter addended by: Bella Kennedyynthia J Russell, PT on: 10/09/2014  2:17 PM<BR>     Documentation filed: Clinical Notes

## 2014-10-10 ENCOUNTER — Ambulatory Visit (HOSPITAL_COMMUNITY)
Admission: RE | Admit: 2014-10-10 | Discharge: 2014-10-10 | Disposition: A | Discharge status: Home / Self Care | Payer: Medicare Other | Source: Ambulatory Visit | Attending: Family Medicine | Admitting: Family Medicine

## 2014-10-10 DIAGNOSIS — T148XXA Other injury of unspecified body region, initial encounter: Secondary | ICD-10-CM

## 2014-10-10 DIAGNOSIS — Z5189 Encounter for other specified aftercare: Secondary | ICD-10-CM | POA: Diagnosis not present

## 2014-10-10 DIAGNOSIS — R29898 Other symptoms and signs involving the musculoskeletal system: Secondary | ICD-10-CM

## 2014-10-10 NOTE — Therapy (Signed)
Wound Care Therapy  Patient Details  Name: KESEAN SERVISS MRN: [id number] Date of Birth: 1951/05/31  Encounter Date: 10/10/2014      PT End of Session - 10/10/14 1839    Visit Number 10   Number of Visits 15   Date for PT Re-Evaluation 10/24/14   Authorization Type [employer] medicare (gcode was updated on visit #5)   Authorization - Visit Number 10   Authorization - Number of Visits 15   PT Start Time 1120   PT Stop Time 1150   PT Time Calculation (min) 30 min   Activity Tolerance Patient tolerated treatment well   Behavior During Therapy [facility] for tasks assessed/performed      Past Medical History  Diagnosis Date  . Atrial fibrillation   . Sleep apnea     on CPAP  . Hypertension   . Hyperlipidemia   . Obesity   . Venous insufficiency   . Normal coronary arteries     by cardiac catheterization performed 03/14/06    Past Surgical History  Procedure Laterality Date  . Gastric bypass  09/2008  . Total hip arthroplasty      right hip    There were no vitals taken for this visit.  Visit Diagnosis:  Nonhealing nonsurgical wound  Weakness of right leg       Problem List Patient Active Problem List   Diagnosis Date Noted  . Weakness of right leg 10/02/2013  . Difficulty in walking(719.7) 10/02/2013  . HTN (hypertension) 04/13/2013  . Hyperlipemia 04/13/2013  . Atrial fibrillation 02/14/2013  . Long term (current) use of anticoagulants 02/14/2013  . MYOCARDIAL INFARCTION 06/25/2008  . EXOGENOUS OBESITY 06/18/2008  . OBSTRUCTIVE SLEEP APNEA 06/18/2008             Wound Therapy - 10/10/14 1831    Subjective Pt states he has his comporession stocking but feels like his leg is not yet healed.  Pt reports his leg is not hurting today.   Pain Assessment No/denies pain   Wound Properties Date First Assessed: 09/24/14 Wound Type: Other (Comment) Location: Leg Location Orientation: Right;Posterior   Dressing Type Impregnated gauze (bismuth);Compression wrap   Dressing Changed Changed   Dressing Status Clean   Dressing Change Frequency Every 3 days   Site / Wound Assessment Clean;Red   % Wound base Red or Granulating 100%   % Wound base Yellow 0%   Peri-wound Assessment Intact   Wound Length (cm) 0.4 cm   Wound Width (cm) 0.4 cm   Drainage Amount Scant   Drainage Description Serous   Non-staged Wound Description Not applicable   Treatment Cleansed;Debridement (Selective)   Wound Properties Date First Assessed: 09/10/14 Time First Assessed: 0930 Wound Type: Laceration Location: Tibial Location Orientation: Right;Medial;Anterior;Mid Wound Description (Comments): Rt LE swollen compared to Lt, Rt LE discolored, likely venous insufficency,  Present on Admission: Yes   Dressing Type Impregnated gauze (bismuth);Compression wrap   Dressing Changed Changed   Dressing Status Clean   Site / Wound Assessment Other (Comment)   % Wound base Red or Granulating 100%   % Wound base Yellow 0%   % Wound base Black 0%   Peri-wound Assessment Intact   Wound Length (cm) 0.5 cm   Wound Width (cm) 0.2 cm   Margins Unattached edges (unapproximated)   Closure None   Drainage Amount Scant   Drainage Description Serous   Non-staged Wound Description Not applicable   Treatment Cleansed;Debridement (Selective)   Wound Therapy - Clinical Statement Wound  continues to heal and is now 100% granulated following debridement.  Washed LE and moisturized prior to reapplication of dressings and compression bandage.    Factors Delaying/Impairing Wound Healing --   Hydrotherapy Plan --  manual to get fluid down then place in compression stocking   Wound Plan Continue to complete woundcare until healed and can don compression garment   Manual Therapy manual lymph draining to include supraclavicular, deep and suprificial abdominal as well as routing fluid via inguinal-axillary anastomsis as well as LE followed by multilayer compression dressing (profore)    Decrease Necrotic  Tissue to 0   Decrease Necrotic Tissue - Progress Met   Increase Granulation Tissue to 100   Increase Granulation Tissue - Progress Met   Decrease Length/Width/Depth by (cm) 2.1X5.1   Decrease Length/Width/Depth - Progress Met   Improve Drainage Characteristics Mod   Improve Drainage Characteristics - Progress Met   Patient/Family will be able to  wear profore/compression garment without cueing between treatments   Patient/Family Instruction Goal - Progress Progressing toward goal   Additional Wound Therapy Goal Pt will be able to ambulate without pain associated with Rt LE wounds   Additional Wound Therapy Goal - Progress Met         Teena Irani, PTA/CLT [phone] 10/10/2014, 6:41 PM

## 2014-10-14 ENCOUNTER — Ambulatory Visit (HOSPITAL_COMMUNITY)
Admission: RE | Admit: 2014-10-14 | Discharge: 2014-10-14 | Disposition: A | Discharge status: Home / Self Care | Payer: Medicare Other | Source: Ambulatory Visit | Attending: Physician Assistant | Admitting: Physician Assistant

## 2014-10-14 DIAGNOSIS — T148XXA Other injury of unspecified body region, initial encounter: Secondary | ICD-10-CM

## 2014-10-14 DIAGNOSIS — Z5189 Encounter for other specified aftercare: Secondary | ICD-10-CM | POA: Diagnosis not present

## 2014-10-14 DIAGNOSIS — R29898 Other symptoms and signs involving the musculoskeletal system: Secondary | ICD-10-CM

## 2014-10-14 NOTE — Therapy (Signed)
Wound Care Therapy  Patient Details  Name: Joel Rogers MRN: [id number] Date of Birth: 1951/09/09  Encounter Date: 10/14/2014      PT End of Session - 10/14/14 0936    Visit Number 11   Number of Visits 15   Date for PT Re-Evaluation 10/24/14   Authorization Type [employer] medicare (gcode was updated on visit #5)   Authorization - Visit Number 11   Authorization - Number of Visits 15   PT Start Time [phone]   PT Stop Time 0930   PT Time Calculation (min) 32 min   Activity Tolerance Patient tolerated treatment well   Behavior During Therapy [facility] for tasks assessed/performed      Past Medical History  Diagnosis Date  . Atrial fibrillation   . Sleep apnea     on CPAP  . Hypertension   . Hyperlipidemia   . Obesity   . Venous insufficiency   . Normal coronary arteries     by cardiac catheterization performed 03/14/06    Past Surgical History  Procedure Laterality Date  . Gastric bypass  09/2008  . Total hip arthroplasty      right hip    There were no vitals taken for this visit.  Visit Diagnosis:  Nonhealing nonsurgical wound  Weakness of right leg    Problem List Patient Active Problem List   Diagnosis Date Noted  . Weakness of right leg 10/02/2013  . Difficulty in walking(719.7) 10/02/2013  . HTN (hypertension) 04/13/2013  . Hyperlipemia 04/13/2013  . Atrial fibrillation 02/14/2013  . Long term (current) use of anticoagulants 02/14/2013  . MYOCARDIAL INFARCTION 06/25/2008  . EXOGENOUS OBESITY 06/18/2008  . OBSTRUCTIVE SLEEP APNEA 06/18/2008          Wound Therapy - 10/14/14 0933    Subjective Pt states his leg feels better than it ever has.   Pain Assessment No/denies pain   Wound Properties Date First Assessed: 09/24/14 Wound Type: Other (Comment) Location: Leg Location Orientation: Right;Posterior   Dressing Type Impregnated gauze (bismuth);Compression wrap   Dressing Changed Changed   Dressing Status Clean   Dressing Change Frequency Every 3 days    Site / Wound Assessment Clean;Red   % Wound base Red or Granulating 100%   % Wound base Yellow 0%   Peri-wound Assessment Intact   Drainage Amount Scant   Drainage Description Serous   Non-staged Wound Description Not applicable   Treatment Cleansed;Debridement (Selective)   Wound Properties Date First Assessed: 09/10/14 Time First Assessed: 0930 Wound Type: Laceration Location: Tibial Location Orientation: Right;Medial;Anterior;Mid Wound Description (Comments): Rt LE swollen compared to Lt, Rt LE discolored, likely venous insufficency,  Present on Admission: Yes   Dressing Type Impregnated gauze (bismuth);Compression wrap   Dressing Changed Changed   Dressing Status Clean   Site / Wound Assessment Other (Comment)   % Wound base Red or Granulating 100%   % Wound base Yellow 0%   % Wound base Black 0%   Peri-wound Assessment Intact   Margins Unattached edges (unapproximated)   Closure None   Drainage Amount Scant   Drainage Description Serous   Non-staged Wound Description Not applicable   Treatment Cleansed;Debridement (Selective)   Wound Therapy - Clinical Statement Debrided remaining dry skin perimeter of anterior Rt LE wound and slough from middle of wound.  No slough present or debridment required for posterior LE wound.  Cleansed and moisturized LE prior to dressing.  jContinued with xeroform and profore compression.     Hydrotherapy Plan --  manual to get fluid down then place in compression stocking   Wound Plan Continue to complete woundcare until healed and can don compression garment   Manual Therapy --   Decrease Necrotic Tissue to 0   Decrease Necrotic Tissue - Progress Met   Increase Granulation Tissue to 100   Increase Granulation Tissue - Progress Met   Decrease Length/Width/Depth by (cm) 2.1X5.1   Decrease Length/Width/Depth - Progress Met   Improve Drainage Characteristics Mod   Improve Drainage Characteristics - Progress Met   Patient/Family will be able to   wear profore/compression garment without cueing between treatments   Patient/Family Instruction Goal - Progress Progressing toward goal   Additional Wound Therapy Goal Pt will be able to ambulate without pain associated with Rt LE wounds   Additional Wound Therapy Goal - Progress Met          Teena Irani, PTA/CLT [phone] 10/14/2014, 9:37 AM

## 2014-10-16 ENCOUNTER — Ambulatory Visit (HOSPITAL_COMMUNITY)
Admission: RE | Admit: 2014-10-16 | Discharge: 2014-10-16 | Disposition: A | Discharge status: Home / Self Care | Payer: Medicare Other | Source: Ambulatory Visit | Attending: Physician Assistant | Admitting: Physician Assistant

## 2014-10-16 DIAGNOSIS — Z5189 Encounter for other specified aftercare: Secondary | ICD-10-CM | POA: Diagnosis not present

## 2014-10-16 DIAGNOSIS — R29898 Other symptoms and signs involving the musculoskeletal system: Secondary | ICD-10-CM

## 2014-10-16 DIAGNOSIS — T148XXA Other injury of unspecified body region, initial encounter: Secondary | ICD-10-CM

## 2014-10-16 NOTE — Therapy (Signed)
Wound Care Therapy  Patient Details  Name: Joel Rogers MRN: [id number] Date of Birth: [birth year]  Encounter Date: 10/16/2014      PT End of Session - 10/16/14 1706    Visit Number 12   Number of Visits 15   Date for PT Re-Evaluation 10/24/14   Authorization Type [employer] medicare (gcode was updated on visit #5)   Authorization - Visit Number 12   Authorization - Number of Visits 15   PT Start Time 1604   PT Stop Time 1628   PT Time Calculation (min) 24 min   Activity Tolerance Patient tolerated treatment well   Behavior During Therapy [facility] for tasks assessed/performed      Past Medical History  Diagnosis Date  . Atrial fibrillation   . Sleep apnea     on CPAP  . Hypertension   . Hyperlipidemia   . Obesity   . Venous insufficiency   . Normal coronary arteries     by cardiac catheterization performed 03/14/06    Past Surgical History  Procedure Laterality Date  . Gastric bypass  09/2008  . Total hip arthroplasty      right hip    There were no vitals taken for this visit.  Visit Diagnosis:  Nonhealing nonsurgical wound  Weakness of right leg        Wound Therapy - 10/16/14 1700    Subjective Pt states his leg has been itching alot. Pt reports no pain.   Pain Assessment No/denies pain   Wound Properties Date First Assessed: 09/24/14 Wound Type: Other (Comment) Location: Leg Location Orientation: Right;Posterior   Dressing Type Impregnated gauze (bismuth);Compression wrap   Dressing Changed Changed   Dressing Status Clean   Dressing Change Frequency Every 3 days   Site / Wound Assessment Clean;Red   % Wound base Red or Granulating 100%   % Wound base Yellow 0%   Peri-wound Assessment Intact   Drainage Amount Scant   Drainage Description Serous   Non-staged Wound Description Not applicable   Treatment Cleansed;Debridement (Selective)   Wound Properties Date First Assessed: 09/10/14 Time First Assessed: 0930 Wound Type: Laceration Location: Tibial Location  Orientation: Right;Medial;Anterior;Mid Wound Description (Comments): Rt LE swollen compared to Lt, Rt LE discolored, likely venous insufficency,  Present on Admission: Yes   Dressing Type Impregnated gauze (bismuth);Compression wrap   Dressing Changed Changed   Dressing Status Clean   Site / Wound Assessment Other (Comment)   % Wound base Red or Granulating 100%   % Wound base Yellow 0%   % Wound base Black 0%   Peri-wound Assessment Intact   Wound Length (cm) 0 cm   Wound Width (cm) 0 cm   Margins Attached edges (approximated)   Closure Approximated   Drainage Amount None   Drainage Description --   Non-staged Wound Description Not applicable   Wound Therapy - Clinical Statement Wound anterior LE is now healed.  Posterior wound still with small opening but 100% granulated.  Anticipate posterior wound to be healed by next week.   Hydrotherapy Plan --  manual to get fluid down then place in compression stocking   Wound Plan Continue to complete woundcare until healed and can don compression garment   Decrease Necrotic Tissue to 0   Decrease Necrotic Tissue - Progress Met   Increase Granulation Tissue to 100   Increase Granulation Tissue - Progress Met   Decrease Length/Width/Depth by (cm) 2.1X5.1   Decrease Length/Width/Depth - Progress Met   Improve Drainage Characteristics  Mod   Improve Drainage Characteristics - Progress Progressing toward goal   Patient/Family will be able to  wear profore/compression garment without cueing between treatments   Patient/Family Instruction Goal - Progress Progressing toward goal   Additional Wound Therapy Goal Pt will be able to ambulate without pain associated with Rt LE wounds   Additional Wound Therapy Goal - Progress Met          Problem List Patient Active Problem List   Diagnosis Date Noted  . Weakness of right leg 10/02/2013  . Difficulty in walking(719.7) 10/02/2013  . HTN (hypertension) 04/13/2013  . Hyperlipemia 04/13/2013  .  Atrial fibrillation 02/14/2013  . Long term (current) use of anticoagulants 02/14/2013  . MYOCARDIAL INFARCTION 06/25/2008  . EXOGENOUS OBESITY 06/18/2008  . OBSTRUCTIVE SLEEP APNEA 06/18/2008        Teena Irani, PTA/CLT [phone] 10/16/2014, 5:08 PM

## 2014-10-22 ENCOUNTER — Ambulatory Visit (HOSPITAL_COMMUNITY)
Admission: RE | Admit: 2014-10-22 | Discharge: 2014-10-22 | Disposition: A | Discharge status: Home / Self Care | Payer: Medicare Other | Source: Ambulatory Visit | Attending: Family Medicine | Admitting: Family Medicine

## 2014-10-22 DIAGNOSIS — I83013 Varicose veins of right lower extremity with ulcer of ankle: Secondary | ICD-10-CM | POA: Diagnosis not present

## 2014-10-22 DIAGNOSIS — T148XXA Other injury of unspecified body region, initial encounter: Secondary | ICD-10-CM

## 2014-10-22 DIAGNOSIS — R29898 Other symptoms and signs involving the musculoskeletal system: Secondary | ICD-10-CM

## 2014-10-22 DIAGNOSIS — R262 Difficulty in walking, not elsewhere classified: Secondary | ICD-10-CM | POA: Insufficient documentation

## 2014-10-22 DIAGNOSIS — Z5189 Encounter for other specified aftercare: Secondary | ICD-10-CM | POA: Insufficient documentation

## 2014-10-22 NOTE — Therapy (Signed)
Wound Care Evaluation  Patient Details  Name: Joel Rogers MRN: [id number] Date of Birth: 1951/08/04  Encounter Date: 10/22/2014 PHYSICAL THERAPY DISCHARGE SUMMARY  Plan: Patient agrees to discharge.  Patient goals were partially met. Patient is being discharged due to meeting the stated rehab goals.  ?????         PT End of Session - 10/22/14 0839    Visit Number 13   Number of Visits 15   Date for PT Re-Evaluation 10/24/14   Authorization Type [employer] medicare (gcode was updated on visit #5)   Authorization - Visit Number 13   Authorization - Number of Visits 15   PT Start Time 0800   PT Stop Time 0830   PT Time Calculation (min) 30 min   Activity Tolerance Patient tolerated treatment well   Behavior During Therapy WFL for tasks assessed/performed      Past Medical History  Diagnosis Date  . Atrial fibrillation   . Sleep apnea     on CPAP  . Hypertension   . Hyperlipidemia   . Obesity   . Venous insufficiency   . Normal coronary arteries     by cardiac catheterization performed 03/14/06    Past Surgical History  Procedure Laterality Date  . Gastric bypass  09/2008  . Total hip arthroplasty      right hip    There were no vitals taken for this visit.  Visit Diagnosis:  Nonhealing nonsurgical wound  Weakness of right leg         Wound Therapy - 10/22/14 0832    Subjective Pt reports itching but no other discomfort.  Pt comes today with profore bandage intact and with compression garment.   Pain Assessment No/denies pain   Wound Properties Date First Assessed: 09/24/14 Wound Type: Other (Comment) Location: Leg Location Orientation: Right;Posterior   Dressing Type None   Dressing Changed Other (Comment)   Dressing Status Clean   % Wound base Red or Granulating 100%   % Wound base Yellow 0%   Peri-wound Assessment Intact   Drainage Amount None   Non-staged Wound Description Not applicable   Treatment Other (Comment)   Wound Properties Date First  Assessed: 09/10/14 Time First Assessed: 0930 Wound Type: Laceration Location: Tibial Location Orientation: Right;Medial;Anterior;Mid Wound Description (Comments): Rt LE swollen compared to Lt, Rt LE discolored, likely venous insufficency,  Present on Admission: Yes   Dressing Type None   Dressing Changed Other (Comment)   Dressing Status None   Site / Wound Assessment Other (Comment)   % Wound base Red or Granulating 100%   % Wound base Yellow 0%   % Wound base Black 0%   Peri-wound Assessment Intact   Margins Attached edges (approximated)   Closure Approximated   Drainage Amount None   Non-staged Wound Description Not applicable   Treatment Other (Comment)   Wound Therapy - Clinical Statement wounds are now healed and without need for further skilled intervention.  Pt educated on compression stocking (pt brought knee high 30-65mmHg stockings).  Pt with extreme difficulty donning stocking due to immobility and h/o THR.  Pt to check into stocking butler for donning and will have his wife assist him until receives DME/training from provider.  Pt has met all goals and is ready for discharge.   Hydrotherapy Plan --  manual to get fluid down then place in compression stocking   Wound Plan All goals met and wound healed.  Pt may be discharged from skilled therapy.  Decrease Necrotic Tissue to 0   Decrease Necrotic Tissue - Progress Met   Increase Granulation Tissue to 100   Increase Granulation Tissue - Progress Met   Decrease Length/Width/Depth by (cm) 2.1X5.1   Decrease Length/Width/Depth - Progress Met   Improve Drainage Characteristics Mod   Improve Drainage Characteristics - Progress Met   Patient/Family will be able to  wear profore/compression garment without cueing between treatments   Patient/Family Instruction Goal - Progress Met   Additional Wound Therapy Goal Pt will be able to ambulate without pain associated with Rt LE wounds   Additional Wound Therapy Goal - Progress Met        [id number] CJ T9539 CI     Problem List Patient Active Problem List   Diagnosis Date Noted  . Weakness of right leg 10/02/2013  . Difficulty in walking(719.7) 10/02/2013  . HTN (hypertension) 04/13/2013  . Hyperlipemia 04/13/2013  . Atrial fibrillation 02/14/2013  . Long term (current) use of anticoagulants 02/14/2013  . MYOCARDIAL INFARCTION 06/25/2008  . EXOGENOUS OBESITY 06/18/2008  . OBSTRUCTIVE SLEEP APNEA 06/18/2008                           Teena Irani, PTA/CLT [phone] 10/22/2014, 8:41 AM  Azucena Freed PT/CLT [phone]

## 2014-10-24 ENCOUNTER — Ambulatory Visit (HOSPITAL_COMMUNITY): Payer: Medicare Other | Admitting: Physical Therapy

## 2014-10-29 ENCOUNTER — Ambulatory Visit (HOSPITAL_COMMUNITY): Payer: Medicare Other | Admitting: Physical Therapy

## 2014-10-31 ENCOUNTER — Ambulatory Visit (HOSPITAL_COMMUNITY): Payer: Medicare Other | Admitting: Physical Therapy

## 2014-11-05 ENCOUNTER — Ambulatory Visit (HOSPITAL_COMMUNITY): Payer: Medicare Other | Admitting: Physical Therapy

## 2014-11-07 ENCOUNTER — Telehealth: Payer: Self-pay | Admitting: Pharmacist Clinician (PhC)/ Clinical Pharmacy Specialist

## 2014-11-07 NOTE — Telephone Encounter (Signed)
Need to know if it is all right to change his manufacturer for his Warfarin. Please call [facility] to okay this change-[phone],please call today. Pt need his medicine asap.

## 2014-11-07 NOTE — Telephone Encounter (Signed)
Ok to switch mfr

## 2014-11-19 ENCOUNTER — Telehealth: Payer: Self-pay | Admitting: Cardiovascular Disease

## 2014-11-19 ENCOUNTER — Telehealth: Payer: Self-pay | Admitting: *Deleted

## 2014-11-19 ENCOUNTER — Other Ambulatory Visit: Payer: Self-pay | Admitting: *Deleted

## 2014-11-19 DIAGNOSIS — D689 Coagulation defect, unspecified: Secondary | ICD-10-CM

## 2014-11-19 DIAGNOSIS — Z7901 Long term (current) use of anticoagulants: Secondary | ICD-10-CM

## 2014-11-19 NOTE — Telephone Encounter (Signed)
Order put in for pt/inr

## 2014-11-19 NOTE — Telephone Encounter (Signed)
[name] from [facility] called to verify order for INR to be drawn. She was unable to see 1-time order [place] submitted today. I gave her requisition number which apparently resolved issue.  Apparently patient also wants to have standing order for lab draws to be done at [facility]. He historically had used [facility]. [facility].

## 2014-11-19 NOTE — Telephone Encounter (Signed)
Joel [facility] with [facility] had called and spoke with Joel OgleAmy Rogers, Engineer, manufacturingpractice manager. She had previously called in needing a PT/INR order for this patient. A standing order for [facility] was released previously by another clinic member, which is why Joel DaftKayla could not locate the order in her system. A one-time PT/INR order was placed for [facility] and if future orders are needed (standing orders) the MD or Joel Rogers, Joel Rogers will need to order (either via EPIC or a faxed form that is completed and sent back to lab). Attempted to call Joel Rogers ext [phone] - no answer. Called this number and spoke with operator and asked to be transferred to lab, no answer.   Information given to [facility] and she will attempt to [facility]

## 2014-11-19 NOTE — Telephone Encounter (Signed)
Joel Rogers is calling because she needs an order for Joel Rogers INR , she received something but all it had was the identifying info .

## 2014-11-20 ENCOUNTER — Ambulatory Visit (INDEPENDENT_AMBULATORY_CARE_PROVIDER_SITE_OTHER): Payer: Medicare Other | Admitting: Pharmacist Clinician (PhC)/ Clinical Pharmacy Specialist

## 2014-11-20 LAB — PROTIME-INR
INR: 2 — AB (ref 0.8–1.2)
Prothrombin Time: 20.8 s — ABNORMAL HIGH (ref 9.1–12.0)

## 2014-12-15 ENCOUNTER — Other Ambulatory Visit: Payer: Self-pay | Admitting: Pharmacist Clinician (PhC)/ Clinical Pharmacy Specialist

## 2014-12-17 ENCOUNTER — Other Ambulatory Visit: Payer: Self-pay | Admitting: Cardiovascular Disease

## 2014-12-17 NOTE — Telephone Encounter (Signed)
Rx(s) sent to pharmacy electronically.  

## 2015-01-02 ENCOUNTER — Telehealth: Payer: Self-pay | Admitting: Pharmacist Clinician (PhC)/ Clinical Pharmacy Specialist

## 2015-01-02 DIAGNOSIS — I482 Chronic atrial fibrillation, unspecified: Secondary | ICD-10-CM

## 2015-01-02 NOTE — Telephone Encounter (Signed)
Spoke with patient, past due with INR check.  Pt would like to switch to NOAC agent to avoid labs.  Has trouble remembering to get labs, often 2-3 months between checks.    No CBC/BMET on record.  Pt will get INR checked tomorrow, then CBC/BMET once receives the orders in the mail.  We can then dose Xarelto or Eliquis accordingly.  Pt in agreement with plan

## 2015-01-07 ENCOUNTER — Other Ambulatory Visit: Payer: Self-pay | Admitting: Cardiovascular Disease

## 2015-01-07 ENCOUNTER — Telehealth: Payer: Self-pay | Admitting: Cardiovascular Disease

## 2015-01-07 DIAGNOSIS — I482 Chronic atrial fibrillation: Secondary | ICD-10-CM | POA: Diagnosis not present

## 2015-01-07 NOTE — Telephone Encounter (Signed)
Received a call from [name] at [facility] needing orders.Advised pt needs INR,CBC,BMET.

## 2015-01-08 LAB — PROTIME-INR
INR: 2.4 — ABNORMAL HIGH (ref 0.8–1.2)
Prothrombin Time: 25.6 s — ABNORMAL HIGH (ref 9.1–12.0)

## 2015-01-09 ENCOUNTER — Ambulatory Visit (INDEPENDENT_AMBULATORY_CARE_PROVIDER_SITE_OTHER): Payer: Self-pay | Admitting: Pharmacist Clinician (PhC)/ Clinical Pharmacy Specialist

## 2015-02-04 DIAGNOSIS — H2513 Age-related nuclear cataract, bilateral: Secondary | ICD-10-CM | POA: Diagnosis not present

## 2015-02-04 DIAGNOSIS — H40013 Open angle with borderline findings, low risk, bilateral: Secondary | ICD-10-CM | POA: Diagnosis not present

## 2015-02-18 DIAGNOSIS — G4733 Obstructive sleep apnea (adult) (pediatric): Secondary | ICD-10-CM | POA: Diagnosis not present

## 2015-02-24 ENCOUNTER — Other Ambulatory Visit: Payer: Self-pay | Admitting: Cardiovascular Disease

## 2015-03-04 DIAGNOSIS — Z7901 Long term (current) use of anticoagulants: Secondary | ICD-10-CM | POA: Diagnosis not present

## 2015-04-05 ENCOUNTER — Other Ambulatory Visit: Payer: Self-pay | Admitting: Cardiovascular Disease

## 2015-04-07 DIAGNOSIS — H40013 Open angle with borderline findings, low risk, bilateral: Secondary | ICD-10-CM | POA: Diagnosis not present

## 2015-04-09 DIAGNOSIS — Z7901 Long term (current) use of anticoagulants: Secondary | ICD-10-CM | POA: Diagnosis not present

## 2015-04-30 DIAGNOSIS — Z96641 Presence of right artificial hip joint: Secondary | ICD-10-CM | POA: Diagnosis not present

## 2015-04-30 DIAGNOSIS — Z471 Aftercare following joint replacement surgery: Secondary | ICD-10-CM | POA: Diagnosis not present

## 2015-05-07 DIAGNOSIS — Z471 Aftercare following joint replacement surgery: Secondary | ICD-10-CM | POA: Diagnosis not present

## 2015-05-07 DIAGNOSIS — M25551 Pain in right hip: Secondary | ICD-10-CM | POA: Diagnosis not present

## 2015-05-07 DIAGNOSIS — Z96641 Presence of right artificial hip joint: Secondary | ICD-10-CM | POA: Diagnosis not present

## 2015-05-19 ENCOUNTER — Other Ambulatory Visit: Payer: Self-pay | Admitting: Cardiovascular Disease

## 2015-05-19 NOTE — Telephone Encounter (Signed)
Rx(s) sent to pharmacy electronically.  

## 2015-05-22 DIAGNOSIS — Z471 Aftercare following joint replacement surgery: Secondary | ICD-10-CM | POA: Diagnosis not present

## 2015-05-22 DIAGNOSIS — M7061 Trochanteric bursitis, right hip: Secondary | ICD-10-CM | POA: Diagnosis not present

## 2015-05-22 DIAGNOSIS — Z96641 Presence of right artificial hip joint: Secondary | ICD-10-CM | POA: Diagnosis not present

## 2015-05-28 DIAGNOSIS — E782 Mixed hyperlipidemia: Secondary | ICD-10-CM | POA: Diagnosis not present

## 2015-05-28 DIAGNOSIS — R011 Cardiac murmur, unspecified: Secondary | ICD-10-CM | POA: Diagnosis not present

## 2015-05-28 DIAGNOSIS — Z6841 Body Mass Index (BMI) 40.0 and over, adult: Secondary | ICD-10-CM | POA: Diagnosis not present

## 2015-05-28 DIAGNOSIS — Z1389 Encounter for screening for other disorder: Secondary | ICD-10-CM | POA: Diagnosis not present

## 2015-05-28 DIAGNOSIS — Z0000 Encounter for general adult medical examination without abnormal findings: Secondary | ICD-10-CM | POA: Diagnosis not present

## 2015-05-28 DIAGNOSIS — Z0001 Encounter for general adult medical examination with abnormal findings: Secondary | ICD-10-CM | POA: Diagnosis not present

## 2015-05-28 DIAGNOSIS — Z7901 Long term (current) use of anticoagulants: Secondary | ICD-10-CM | POA: Diagnosis not present

## 2015-05-28 DIAGNOSIS — R7309 Other abnormal glucose: Secondary | ICD-10-CM | POA: Diagnosis not present

## 2015-05-28 DIAGNOSIS — E1151 Type 2 diabetes mellitus with diabetic peripheral angiopathy without gangrene: Secondary | ICD-10-CM | POA: Diagnosis not present

## 2015-06-29 ENCOUNTER — Other Ambulatory Visit: Payer: Self-pay | Admitting: Cardiovascular Disease

## 2015-07-16 ENCOUNTER — Other Ambulatory Visit: Payer: Self-pay | Admitting: Cardiovascular Disease

## 2015-07-22 ENCOUNTER — Telehealth: Payer: Self-pay | Admitting: Pharmacist Clinician (PhC)/ Clinical Pharmacy Specialist

## 2015-07-22 NOTE — Telephone Encounter (Signed)
Pt states that he has been going in monthly for INR checks and we call him later to give instruction.  He goes to [facility] for lab draws.  I explained that it is [facility] calling him back, not [place].  He wants Dr. Allyson Sabal to be in charge of his anticoagulation because "we always have been and he doesn't want to switch".  I explained that they have monitored him for the past 6 months, so he needs to call them and either continue with their monitoring or be sure they call all labs to [place] for follow up.  I explained that Dr. Allyson Sabal does not need to be in charge of warfarin, just wants to be sure it's being monitored.  Pt will call [facility] and discuss with them.  I refilled Rx x 1 until he sorts this out.

## 2015-08-07 DIAGNOSIS — M7061 Trochanteric bursitis, right hip: Secondary | ICD-10-CM | POA: Diagnosis not present

## 2015-08-07 DIAGNOSIS — Z96641 Presence of right artificial hip joint: Secondary | ICD-10-CM | POA: Diagnosis not present

## 2015-08-07 DIAGNOSIS — Z471 Aftercare following joint replacement surgery: Secondary | ICD-10-CM | POA: Diagnosis not present

## 2015-08-11 ENCOUNTER — Other Ambulatory Visit (HOSPITAL_COMMUNITY): Payer: Self-pay | Admitting: Orthopedic Surgery

## 2015-08-11 DIAGNOSIS — Z96641 Presence of right artificial hip joint: Principal | ICD-10-CM

## 2015-08-11 DIAGNOSIS — Z471 Aftercare following joint replacement surgery: Secondary | ICD-10-CM

## 2015-08-15 ENCOUNTER — Encounter (HOSPITAL_COMMUNITY): Payer: Self-pay

## 2015-08-15 ENCOUNTER — Encounter (HOSPITAL_COMMUNITY)
Admission: RE | Admit: 2015-08-15 | Discharge: 2015-08-15 | Disposition: A | Discharge status: Home / Self Care | Payer: Medicare Other | Source: Ambulatory Visit | Attending: Orthopedic Surgery | Admitting: Orthopedic Surgery

## 2015-08-15 DIAGNOSIS — Z96641 Presence of right artificial hip joint: Secondary | ICD-10-CM | POA: Insufficient documentation

## 2015-08-15 DIAGNOSIS — Z471 Aftercare following joint replacement surgery: Secondary | ICD-10-CM | POA: Diagnosis not present

## 2015-08-15 DIAGNOSIS — M25551 Pain in right hip: Secondary | ICD-10-CM | POA: Diagnosis not present

## 2015-08-15 MED ORDER — TECHNETIUM TC 99M MEDRONATE IV KIT
25.0000 | PACK | Freq: Once | INTRAVENOUS | Status: AC | PRN
  Administered 2015-08-15: 26 via INTRAVENOUS

## 2015-08-20 DIAGNOSIS — Z7901 Long term (current) use of anticoagulants: Secondary | ICD-10-CM | POA: Diagnosis not present

## 2015-08-22 ENCOUNTER — Other Ambulatory Visit: Payer: Self-pay | Admitting: Cardiovascular Disease

## 2015-08-22 NOTE — Telephone Encounter (Signed)
Rx(s) sent to pharmacy electronically.  

## 2015-08-26 DIAGNOSIS — Z7901 Long term (current) use of anticoagulants: Secondary | ICD-10-CM | POA: Diagnosis not present

## 2015-08-26 LAB — PROTIME-INR: INR: 2.1 — AB (ref 0.9–1.1)

## 2015-08-27 ENCOUNTER — Other Ambulatory Visit: Payer: Self-pay | Admitting: Cardiovascular Disease

## 2015-08-27 ENCOUNTER — Ambulatory Visit (HOSPITAL_COMMUNITY): Payer: Medicare Other | Attending: Physician Assistant | Admitting: Physical Therapy

## 2015-08-27 DIAGNOSIS — X58XXXA Exposure to other specified factors, initial encounter: Secondary | ICD-10-CM | POA: Insufficient documentation

## 2015-08-27 DIAGNOSIS — R29898 Other symptoms and signs involving the musculoskeletal system: Secondary | ICD-10-CM | POA: Insufficient documentation

## 2015-08-27 DIAGNOSIS — T148 Other injury of unspecified body region: Secondary | ICD-10-CM | POA: Insufficient documentation

## 2015-08-29 ENCOUNTER — Ambulatory Visit (HOSPITAL_COMMUNITY): Payer: Medicare Other | Admitting: Physical Therapy

## 2015-09-02 ENCOUNTER — Ambulatory Visit (HOSPITAL_COMMUNITY): Payer: Medicare Other | Admitting: Physical Therapy

## 2015-09-02 DIAGNOSIS — T148XXA Other injury of unspecified body region, initial encounter: Secondary | ICD-10-CM

## 2015-09-02 DIAGNOSIS — R29898 Other symptoms and signs involving the musculoskeletal system: Secondary | ICD-10-CM | POA: Diagnosis not present

## 2015-09-02 DIAGNOSIS — T148 Other injury of unspecified body region: Secondary | ICD-10-CM | POA: Diagnosis not present

## 2015-09-02 DIAGNOSIS — X58XXXA Exposure to other specified factors, initial encounter: Secondary | ICD-10-CM | POA: Diagnosis not present

## 2015-09-02 NOTE — Therapy (Signed)
[facility] [address] [place], [name], [address] Phone: [phone]   Fax:  [phone]  Wound Care Evaluation  Patient Details  Name: Joel Rogers MRN: [id number] Date of Birth: October 10, 1951 Referring Provider:  Samuella Bruin  Encounter Date: 09/02/2015      PT End of Session - 09/02/15 1221    Visit Number 1   Number of Visits 8   Date for PT Re-Evaluation 09/30/15   Authorization Type Medicare    Authorization Time Period 09/02/15 to 11/02/15   Authorization - Visit Number 1   Authorization - Number of Visits 10   PT Start Time 1014   PT Stop Time 1056   PT Time Calculation (min) 42 min   Activity Tolerance Patient tolerated treatment well   Behavior During Therapy [facility] for tasks assessed/performed      Past Medical History  Diagnosis Date  . Atrial fibrillation   . Sleep apnea     on CPAP  . Hypertension   . Hyperlipidemia   . Obesity   . Venous insufficiency   . Normal coronary arteries     by cardiac catheterization performed 03/14/06    Past Surgical History  Procedure Laterality Date  . Gastric bypass  09/2008  . Total hip arthroplasty      right hip    There were no vitals filed for this visit.  Visit Diagnosis:  Nonhealing nonsurgical wound - Plan: PT plan of care cert/re-cert  Weakness of right leg - Plan: PT plan of care cert/re-cert        [facility] PT Assessment - 09/02/15 0001    Assessment   Medical Diagnosis wound on leg    Onset Date/Surgical Date --  about 2 weeks ago    Precautions   Precautions Other (comment)   Precaution Comments blood thinners    Restrictions   Weight Bearing Restrictions No   Balance Screen   Has the patient fallen in the past 6 months No   Has the patient had a decrease in activity level because of a fear of falling?  No   Is the patient reluctant to leave their home because of a fear of falling?  No   Prior Function   Level of Independence  Independent;Independent with basic ADLs;Independent with gait;Independent with transfers         Wound Therapy - 09/02/15 1212    Subjective Patient reports that this most recent wound started as a blister, and popped it, but that it still ended up developing into a sore. It has been around for a couple of weeks now. He reports that he has just been putting a bandage on it for now, no cream or anything. Patient reports that he thinks that he might have been told in teh past that he has lymphedema, and does take fluid pills. He reports that he has received PT here for a simialr wound and that it healed up very well.    Patient and Family Stated Goals wound closure   Date of Onset --  2 weeks ago    Pain Assessment No/denies pain   Evaluation and Treatment Procedures Explained to Patient/Family Yes   Evaluation and Treatment Procedures agreed to   Wound Properties Date First Assessed: 09/02/15 Wound Type: Other (Comment) Location: Tibial Location Orientation: Right Present on Admission: Yes   Dressing Type Gauze (Comment)   Dressing Changed New   Dressing Status Old drainage   Dressing Change Frequency  Daily   Site / Wound Assessment Red   % Wound base Red or Granulating 100%   % Wound base Yellow 0%   % Wound base Black 0%   % Wound base Other (Comment) 0%   Peri-wound Assessment Other (Comment)  epibole    Wound Length (cm) 1.2 cm   Wound Width (cm) 1.8 cm   Wound Depth (cm) 0.1 cm   Tunneling (cm) --  none   Undermining (cm) none   Margins Epibole (rolled edges)   Closure None   Drainage Amount Scant   Drainage Description Serosanguineous   Treatment Cleansed;Packing (Impregnated strip);Other (Comment);Tape changed  medihoney, gauze, profore    Wound Therapy - Clinical Statement Patient presents with new wound that he reports opened up a couple of weeks ago. He has only been using gauze on it for now. He reports taht he had a similar wound that was treated here with great  success with honey and profore. At this time patient will benefit from skilled PT services in order to facilitate wound healing and avoid iinfection.    Wound Therapy - Functional Problem List non-healing wound    Factors Delaying/Impairing Wound Healing Vascular compromise   Wound Therapy - Frequency Other (comment)  2x/week    Wound Therapy - Current Recommendations PT   Wound Plan medihoney, profore    Decrease Length/Width/Depth by (cm) 1.0   Decrease Length/Width/Depth - Progress Goal set today   Patient/Family will be able to  consistently and correctly self-apply wound dressings when appropraite    Patient/Family Instruction Goal - Progress Goal set today   Additional Wound Therapy Goal full wound closure    Additional Wound Therapy Goal - Progress Goal set today   Wound Therapy - Potential for Goals Good                         PT Education - Oct 02, 2015 1221    Education provided Yes   Education Details prognosis, plan of care moving forward    Person(s) Educated Patient   Methods Explanation   Comprehension Verbalized understanding                     G-Codes - 2015-10-02 1440    Functional Assessment Tool Used Based on wound healing status, presence of slough or other non-viable tissue    Functional Limitation Other PT primary   Other PT Primary Current Status (Z6109) At least 20 percent but less than 40 percent impaired, limited or restricted   Other PT Primary Goal Status (U0454) At least 1 percent but less than 20 percent impaired, limited or restricted      Problem List Patient Active Problem List   Diagnosis Date Noted  . Weakness of right leg 10/02/2013  . Difficulty in walking(719.7) 10/02/2013  . HTN (hypertension) 04/13/2013  . Hyperlipemia 04/13/2013  . Atrial fibrillation (HCC) 02/14/2013  . Long term (current) use of anticoagulants 02/14/2013  . MYOCARDIAL INFARCTION 06/25/2008  . EXOGENOUS OBESITY 06/18/2008  . OBSTRUCTIVE  SLEEP APNEA 06/18/2008    Nedra Hai PT, DPT [phone]  [facility] [address] [place], [name], [address] Phone: [phone]   Fax:  [phone]

## 2015-09-04 ENCOUNTER — Ambulatory Visit (HOSPITAL_COMMUNITY): Payer: Medicare Other

## 2015-09-04 DIAGNOSIS — T148 Other injury of unspecified body region: Secondary | ICD-10-CM | POA: Diagnosis not present

## 2015-09-04 DIAGNOSIS — R29898 Other symptoms and signs involving the musculoskeletal system: Secondary | ICD-10-CM

## 2015-09-04 DIAGNOSIS — T148XXA Other injury of unspecified body region, initial encounter: Secondary | ICD-10-CM

## 2015-09-04 NOTE — Therapy (Signed)
[facility] [address] [place], [name], [address] Phone: [phone]   Fax:  [phone]  Physical Therapy Treatment  Patient Details  Name: Joel Rogers MRN: [id number] Date of Birth: 06/21/1951 Referring Provider:  Samuella Bruin  Encounter Date: 09/04/2015      PT End of Session - 09/04/15 0910    Visit Number 2   Number of Visits 8   Date for PT Re-Evaluation 09/30/15   Authorization Type Medicare    Authorization Time Period 09/02/15 to 11/02/15   Authorization - Visit Number 2   Authorization - Number of Visits 10   PT Start Time 0805   PT Stop Time 0838   PT Time Calculation (min) 33 min   Activity Tolerance Patient tolerated treatment well   Behavior During Therapy [facility] for tasks assessed/performed      Past Medical History  Diagnosis Date  . Atrial fibrillation   . Sleep apnea     on CPAP  . Hypertension   . Hyperlipidemia   . Obesity   . Venous insufficiency   . Normal coronary arteries     by cardiac catheterization performed 03/14/06    Past Surgical History  Procedure Laterality Date  . Gastric bypass  09/2008  . Total hip arthroplasty      right hip    There were no vitals filed for this visit.  Visit Diagnosis:  Nonhealing nonsurgical wound  Weakness of right leg      Subjective Assessment - 09/04/15 0911    Subjective Pt stated pain Rt hip, reported dressings have slid down LE with discomfort from.   Currently in Pain? Yes   Pain Score 2    Pain Location Tibia   Pain Orientation Right;Anterior            Wound Therapy - 09/04/15 0911    Subjective Pt stated pain Rt hip, reported dressings have slid down LE with discomfort from.   Patient and Family Stated Goals wound closure   Pain Assessment 0-10   Evaluation and Treatment Procedures Explained to Patient/Family Yes   Evaluation and Treatment Procedures agreed to   Wound Properties Date First Assessed: 09/02/15 Wound  Type: Other (Comment) Location: Tibial Location Orientation: Right Present on Admission: Yes   Dressing Type Impregnated gauze (petrolatum);Gauze (Comment)  xeroform, vaseline perimeter of wound, profore extra gauze   Dressing Changed New   Dressing Status Old drainage   Dressing Change Frequency Daily   Site / Wound Assessment Red   % Wound base Red or Granulating 100%   % Wound base Yellow 0%   % Wound base Black 0%   % Wound base Other (Comment) 0%   Margins Epibole (rolled edges)   Closure None   Drainage Amount Scant   Drainage Description Serosanguineous   Treatment Cleansed;Debridement (Selective)   Selective Debridement - Location anterior shin   Selective Debridement - Tools Used Forceps   Selective Debridement - Tissue Removed dry skin perimeter of wound   Wound Therapy - Clinical Statement Selective debridement for removal of dry skin perimeter of wound to promote healing.  100% red graunulation tissues following cleansing.  Changed dressings to xeroform for increased moisture, honey not needed this session.  No heat or redness surrounding wound.  Pt with cone shaped LE, added extra gauze surrounding ankle and distal calf and contrinued with profore wrap for edema control.  Medipore tape applied to proximal dressings to assist with non sliding down  LE.  No reports of pain through session.     Wound Therapy - Functional Problem List non-healing wound    Factors Delaying/Impairing Wound Healing Vascular compromise   Wound Therapy - Frequency --  2x/ week   Wound Therapy - Current Recommendations PT   Wound Plan Continue wound care with appropraite dressings   Dressing  xeroform, vaseline perimeter, profore wrap with extra gauze distal calf          Problem List Patient Active Problem List   Diagnosis Date Noted  . Weakness of right leg 10/02/2013  . Difficulty in walking(719.7) 10/02/2013  . HTN (hypertension) 04/13/2013  . Hyperlipemia 04/13/2013  . Atrial  fibrillation (HCC) 02/14/2013  . Long term (current) use of anticoagulants 02/14/2013  . MYOCARDIAL INFARCTION 06/25/2008  . EXOGENOUS OBESITY 06/18/2008  . OBSTRUCTIVE SLEEP APNEA 06/18/2008   Becky Saxasey Rheannon Cerney, LPTA; CBIS [phone]  Juel BurrowCockerham, Emine Lopata Jo 09/04/2015, 9:50 AM  [facility] [address] [place], [place], [address] Phone: [phone]   Fax:  [phone]

## 2015-09-08 ENCOUNTER — Ambulatory Visit (HOSPITAL_COMMUNITY): Payer: Medicare Other | Admitting: Physical Therapy

## 2015-09-08 ENCOUNTER — Other Ambulatory Visit: Payer: Self-pay | Admitting: Cardiovascular Disease

## 2015-09-08 DIAGNOSIS — T148 Other injury of unspecified body region: Secondary | ICD-10-CM | POA: Diagnosis not present

## 2015-09-08 DIAGNOSIS — R29898 Other symptoms and signs involving the musculoskeletal system: Secondary | ICD-10-CM | POA: Diagnosis not present

## 2015-09-08 DIAGNOSIS — T148XXA Other injury of unspecified body region, initial encounter: Secondary | ICD-10-CM

## 2015-09-08 NOTE — Therapy (Signed)
[facility] [address] [place], [place], [address] Phone: [phone]   Fax:  [phone]  Wound Care Therapy  Patient Details  Name: Joel Rogers MRN: [id number] Date of Birth: 18-Aug-1951 No Data Recorded  Encounter Date: 09/08/2015      PT End of Session - 09/08/15 1150    Visit Number 3   Number of Visits 8   Date for PT Re-Evaluation 09/30/15   Authorization Type Medicare    Authorization Time Period 09/02/15 to 11/02/15   Authorization - Visit Number 3   Authorization - Number of Visits 8   PT Start Time 1020   PT Stop Time 1100   PT Time Calculation (min) 40 min   Activity Tolerance Patient tolerated treatment well      Past Medical History  Diagnosis Date  . Atrial fibrillation   . Sleep apnea     on CPAP  . Hypertension   . Hyperlipidemia   . Obesity   . Venous insufficiency   . Normal coronary arteries     by cardiac catheterization performed 03/14/06    Past Surgical History  Procedure Laterality Date  . Gastric bypass  09/2008  . Total hip arthroplasty      right hip    There were no vitals filed for this visit.  Visit Diagnosis:  Nonhealing nonsurgical wound  Weakness of right leg           Wound Therapy - 09/08/15 1141    Subjective Pt states that the bandage stayed on well this time.    Patient and Family Stated Goals wound closure   Pain Assessment No/denies pain   Evaluation and Treatment Procedures Explained to Patient/Family Yes   Evaluation and Treatment Procedures agreed to   Wound Properties Date First Assessed: 09/02/15 Wound Type: Other (Comment) Location: Tibial Location Orientation: Right Present on Admission: Yes   Dressing Type Compression wrap   Dressing Changed Changed   Dressing Status Old drainage   Dressing Change Frequency Every 3 days   Site / Wound Assessment Red   % Wound base Red or Granulating 100%   % Wound base Yellow 0%   % Wound base Black 0%   %  Wound base Other (Comment) 0%   Peri-wound Assessment Edema;Induration   Margins Unattached edges (unapproximated)   Closure None   Drainage Amount Scant   Drainage Description Serous   Treatment Cleansed;Other (Comment)   Selective Debridement - Location no selective debridement needed at this time.  Slough came off with gentle cleansing.    Wound Therapy - Clinical Statement No selective debridement was needed at this time.  Began manual decongestive techniques to decrease swelling and promote healing.    Wound Therapy - Functional Problem List non-healing wound    Factors Delaying/Impairing Wound Healing Vascular compromise   Wound Therapy - Frequency --  2x/ week   Wound Therapy - Current Recommendations PT   Wound Plan Continue wound care with appropraite dressings with manual added to decrease swelling.    Dressing  xeroform, vaseline perimeter, profore wrap with extra gauze distal calf    Manual Therapy supraclavicular followed by deep and superficial  Abdominal followed by  routing fluid from inguinal to axillary anastomosis followed by LE and multilayer compression wrap with profore.                  PT Education - 09/08/15 1149    Education provided Yes   Education Details  importance of taking compression off if it becomes uncomfortable.    Person(s) Educated Patient   Methods Explanation   Comprehension Verbalized understanding          Problem List Patient Active Problem List   Diagnosis Date Noted  . Weakness of right leg 10/02/2013  . Difficulty in walking(719.7) 10/02/2013  . HTN (hypertension) 04/13/2013  . Hyperlipemia 04/13/2013  . Atrial fibrillation (HCC) 02/14/2013  . Long term (current) use of anticoagulants 02/14/2013  . MYOCARDIAL INFARCTION 06/25/2008  . EXOGENOUS OBESITY 06/18/2008  . OBSTRUCTIVE SLEEP APNEA 06/18/2008    Virgina Organ, PT CLT [phone] 09/08/2015, 11:51 AM  [facility] [address] [place], [name], [address] Phone: [phone]   Fax:  [phone]  Name: Joel Rogers MRN: [id number] Date of Birth: 05/03/1951

## 2015-09-11 ENCOUNTER — Other Ambulatory Visit: Payer: Self-pay | Admitting: Cardiovascular Disease

## 2015-09-11 ENCOUNTER — Ambulatory Visit (HOSPITAL_COMMUNITY): Payer: Medicare Other | Admitting: Physical Therapy

## 2015-09-11 DIAGNOSIS — T148 Other injury of unspecified body region: Secondary | ICD-10-CM | POA: Diagnosis not present

## 2015-09-11 DIAGNOSIS — R29898 Other symptoms and signs involving the musculoskeletal system: Secondary | ICD-10-CM | POA: Diagnosis not present

## 2015-09-11 DIAGNOSIS — T148XXA Other injury of unspecified body region, initial encounter: Secondary | ICD-10-CM

## 2015-09-11 NOTE — Therapy (Signed)
[facility] [address] [place] [place], [place], [address] Phone: [phone]   Fax:  [phone]  Wound Care Therapy  Patient Details  Name: Joel LeachRalph L Rogers MRN: [id number] Date of Birth: 14-Oct-1951 No Data Recorded  Encounter Date: 09/11/2015      PT End of Session - 09/11/15 1139    Visit Number 4   Number of Visits 8   Date for PT Re-Evaluation 09/30/15   Authorization Type Medicare    Authorization Time Period 09/02/15 to 11/02/15   Authorization - Visit Number 4   Authorization - Number of Visits 8   PT Start Time 1015   PT Stop Time 1055   PT Time Calculation (min) 40 min   Activity Tolerance Patient tolerated treatment well   Behavior During Therapy [facility] for tasks assessed/performed      Past Medical History  Diagnosis Date  . Atrial fibrillation   . Sleep apnea     on CPAP  . Hypertension   . Hyperlipidemia   . Obesity   . Venous insufficiency   . Normal coronary arteries     by cardiac catheterization performed 03/14/06    Past Surgical History  Procedure Laterality Date  . Gastric bypass  09/2008  . Total hip arthroplasty      right hip    There were no vitals filed for this visit.  Visit Diagnosis:  Nonhealing nonsurgical wound  Weakness of right leg                 Wound Therapy - 09/11/15 1134    Subjective Pt states his leg is getting itchy and can tell it's time for a change.  STates he has a blister on his Lt shin as well.    Patient and Family Stated Goals wound closure   Pain Assessment No/denies pain   Evaluation and Treatment Procedures Explained to Patient/Family Yes   Evaluation and Treatment Procedures agreed to   Wound Properties Date First Assessed: 09/02/15 Wound Type: Other (Comment) Location: Tibial Location Orientation: Right Present on Admission: Yes   Dressing Type Compression wrap;Impregnated gauze (bismuth)  profore   Dressing Changed Changed   Dressing Status Old  drainage   Dressing Change Frequency Every 3 days   Site / Wound Assessment Red   % Wound base Red or Granulating 100%   % Wound base Yellow 0%   % Wound base Black 0%   % Wound base Other (Comment) 0%   Peri-wound Assessment Edema;Induration   Margins Unattached edges (unapproximated)   Closure None   Drainage Amount Scant   Drainage Description Serous   Treatment Cleansed;Debridement (Selective)   Selective Debridement - Location no selective debridement needed at this time.  Slough came off with gentle cleansing.    Wound Therapy - Clinical Statement No selective debridement was needed again this visit.  Completed manual lymph draiange.  Noted with blistery, glossy skin from edema Lt LE.  Encouraged patient to obtain new compression and should be wearing something on his LT LE as well.  Pt given information regarding compression socks at [facility] to see if these will assist at this time.  Pt reported comfort with dressing at end of session.    Wound Therapy - Functional Problem List non-healing wound    Factors Delaying/Impairing Wound Healing Vascular compromise   Wound Therapy - Frequency --  2x/ week   Wound Therapy - Current Recommendations PT   Wound Plan Continue wound care with appropraite  dressings   Dressing  xeroform, vaseline perimeter, profore wrap with extra cotton distal calf    Manual Therapy edema techniques and short decompression treatment                              Problem List Patient Active Problem List   Diagnosis Date Noted  . Weakness of right leg 10/02/2013  . Difficulty in walking(719.7) 10/02/2013  . HTN (hypertension) 04/13/2013  . Hyperlipemia 04/13/2013  . Atrial fibrillation (HCC) 02/14/2013  . Long term (current) use of anticoagulants 02/14/2013  . MYOCARDIAL INFARCTION 06/25/2008  . EXOGENOUS OBESITY 06/18/2008  . OBSTRUCTIVE SLEEP APNEA 06/18/2008    Joel Rogers, PTA/CLT [phone]  09/11/2015, 11:42  AM  [facility] [address] [place], [name], [address] Phone: [phone]   Fax:  [phone]  Name: Joel Rogers MRN: [id number] Date of Birth: 12-16-1950

## 2015-09-16 ENCOUNTER — Ambulatory Visit (HOSPITAL_COMMUNITY): Payer: Medicare Other

## 2015-09-16 DIAGNOSIS — T148XXA Other injury of unspecified body region, initial encounter: Secondary | ICD-10-CM

## 2015-09-16 DIAGNOSIS — T148 Other injury of unspecified body region: Secondary | ICD-10-CM | POA: Diagnosis not present

## 2015-09-16 DIAGNOSIS — R29898 Other symptoms and signs involving the musculoskeletal system: Secondary | ICD-10-CM

## 2015-09-16 NOTE — Therapy (Signed)
[facility] [address] [place], [name], [address] Phone: [phone]   Fax:  [phone]  Wound Care Therapy  Patient Details  Name: Joel Rogers MRN: [id number] Date of Birth: [birth year] No Data Recorded  Encounter Date: 09/16/2015      PT End of Session - 09/16/15 1121    Visit Number 5   Number of Visits 8   Date for PT Re-Evaluation 09/30/15   Authorization Type Medicare    Authorization Time Period 09/02/15 to 11/02/15   Authorization - Visit Number 5   Authorization - Number of Visits 8   PT Start Time 1023   PT Stop Time 1106   PT Time Calculation (min) 43 min   Activity Tolerance Patient tolerated treatment well   Behavior During Therapy [facility] for tasks assessed/performed      Past Medical History  Diagnosis Date  . Atrial fibrillation   . Sleep apnea     on CPAP  . Hypertension   . Hyperlipidemia   . Obesity   . Venous insufficiency   . Normal coronary arteries     by cardiac catheterization performed 03/14/06    Past Surgical History  Procedure Laterality Date  . Gastric bypass  09/2008  . Total hip arthroplasty      right hip    There were no vitals filed for this visit.  Visit Diagnosis:  Nonhealing nonsurgical wound  Weakness of right leg      Subjective Assessment - 09/16/15 1525    Subjective Pt stated pain free, main c/o is dressings sliding down with increased discomfort   Currently in Pain? No/denies            Wound Therapy - 09/16/15 1525    Subjective Pt stated pain free, main c/o is dressings sliding down with increased discomfort   Patient and Family Stated Goals wound closure   Pain Assessment No/denies pain   Evaluation and Treatment Procedures Explained to Patient/Family Yes   Evaluation and Treatment Procedures agreed to   Wound Properties Date First Assessed: 09/02/15 Wound Type: Other (Comment) Location: Tibial Location Orientation: Right Present on Admission: Yes   Dressing Type Compression wrap;Impregnated gauze (bismuth)  vaseline perimeter, 4x4 and profore with netting   Dressing Changed Changed   Dressing Status Old drainage   Dressing Change Frequency Every 3 days   Site / Wound Assessment Red   % Wound base Red or Granulating 100%   % Wound base Yellow 0%   % Wound base Black 0%   % Wound base Other (Comment) 0%   Peri-wound Assessment Edema;Induration   Margins Unattached edges (unapproximated)   Closure None   Drainage Amount Scant   Drainage Description Serous   Treatment Cleansed;Debridement (Selective)   Selective Debridement - Location no selective debridement needed at this time.  Slough came off with gentle cleansing.    Wound Therapy - Clinical Statement No selective debridement necessary this session, slough and dry skin easy to remove with saline and 4x4.  Continued retro massage to assist with edema control, xeroform and profore compression dressings applied for edema and healing.  No reports of pain through session.     Wound Therapy - Functional Problem List non-healing wound    Factors Delaying/Impairing Wound Healing Vascular compromise   Wound Therapy - Frequency --  2x/week   Wound Therapy - Current Recommendations PT   Wound Plan Continue wound care with appropraite dressings   Dressing  xeroform, vaseline perimeter,  profore wrap with extra cotton distal calf    Manual Therapy Retro massage for edema control with LE elevated       Problem List Patient Active Problem List   Diagnosis Date Noted  . Weakness of right leg 10/02/2013  . Difficulty in walking(719.7) 10/02/2013  . HTN (hypertension) 04/13/2013  . Hyperlipemia 04/13/2013  . Atrial fibrillation (HCC) 02/14/2013  . Long term (current) use of anticoagulants 02/14/2013  . MYOCARDIAL INFARCTION 06/25/2008  . EXOGENOUS OBESITY 06/18/2008  . OBSTRUCTIVE SLEEP APNEA 06/18/2008   Joel Rogers, LPTA; CBIS [phone]  Joel Rogers, Joel Rogers 09/16/2015,  3:31 PM  [facility] [address] [place], [place], [address] Phone: [phone]   Fax:  [phone]  Name: Joel Rogers MRN: [id number] Date of Birth: [birth year]

## 2015-09-18 ENCOUNTER — Ambulatory Visit (HOSPITAL_COMMUNITY): Payer: Medicare Other

## 2015-09-18 DIAGNOSIS — T148 Other injury of unspecified body region: Secondary | ICD-10-CM | POA: Diagnosis not present

## 2015-09-18 DIAGNOSIS — R29898 Other symptoms and signs involving the musculoskeletal system: Secondary | ICD-10-CM

## 2015-09-18 DIAGNOSIS — T148XXA Other injury of unspecified body region, initial encounter: Secondary | ICD-10-CM

## 2015-09-18 NOTE — Therapy (Signed)
[facility] [address] [place], [place], [address] Phone: [phone]   Fax:  [phone]  Wound Care Therapy  Patient Details  Name: Joel LeachRalph L Rogers MRN: [id number] Date of Birth: [birth year] No Data Recorded  Encounter Date: 09/18/2015      PT End of Session - 09/18/15 1849    Visit Number 6   Number of Visits 8   Date for PT Re-Evaluation 09/30/15   Authorization Type Medicare    Authorization Time Period 09/02/15 to 11/02/15   Authorization - Visit Number 6   Authorization - Number of Visits 8   PT Start Time 1023   PT Stop Time 1103   PT Time Calculation (min) 40 min   Activity Tolerance Patient tolerated treatment well   Behavior During Therapy [facility] for tasks assessed/performed      Past Medical History  Diagnosis Date  . Atrial fibrillation   . Sleep apnea     on CPAP  . Hypertension   . Hyperlipidemia   . Obesity   . Venous insufficiency   . Normal coronary arteries     by cardiac catheterization performed 03/14/06    Past Surgical History  Procedure Laterality Date  . Gastric bypass  09/2008  . Total hip arthroplasty      right hip    There were no vitals filed for this visit.  Visit Diagnosis:  Nonhealing nonsurgical wound  Weakness of right leg      Subjective Assessment - 09/18/15 1429    Subjective Pain free, reported the tape assisted in dressings not sliding down   Currently in Pain? No/denies            Wound Therapy - 09/18/15 1850    Subjective Pain free, reported the tape assisted in dressings not sliding down   Patient and Family Stated Goals wound closure   Wound Properties Date First Assessed: 09/02/15 Wound Type: Other (Comment) Location: Tibial Location Orientation: Right Present on Admission: Yes   Dressing Type Compression wrap;Impregnated gauze (bismuth)  xeroform, 2x2, metapore tape, profore wrap   Dressing Status Old drainage   Dressing Change Frequency Every 3 days   Site / Wound Assessment Red   % Wound base Red or Granulating 100%   % Wound base Yellow 0%   Peri-wound Assessment Edema;Induration   Margins Attached edges (approximated)   Closure None   Drainage Amount Scant   Drainage Description Serous   Selective Debridement - Location no selective debridement needed at this time.  Slough came off with gentle cleansing.    Wound Therapy - Clinical Statement No selective debridement necessary this session, able to remove slough with saline and gauze.  Wound presents with improved approximation and no depth this session.  Continued with manual retro massage technqiues to assist with edema control.  Discussion held about need to purchase new compression hose for edema control, pt plans to buy additional hose soon.  No reports of pain through session.  Pt stated that the blisters are resolved on his opposite LE, has been wearing compression hose more regulary.   Wound Therapy - Functional Problem List non-healing wound    Factors Delaying/Impairing Wound Healing Vascular compromise   Wound Therapy - Current Recommendations PT   Wound Plan Continue wound care with appropraite dressings   Dressing  xeroform, vaseline perimeter, profore wrap with extra cotton distal calf    Manual Therapy Retro massage for edema control with LE elevated     Problem  List Patient Active Problem List   Diagnosis Date Noted  . Weakness of right leg 10/02/2013  . Difficulty in walking(719.7) 10/02/2013  . HTN (hypertension) 04/13/2013  . Hyperlipemia 04/13/2013  . Atrial fibrillation (HCC) 02/14/2013  . Long term (current) use of anticoagulants 02/14/2013  . MYOCARDIAL INFARCTION 06/25/2008  . EXOGENOUS OBESITY 06/18/2008  . OBSTRUCTIVE SLEEP APNEA 06/18/2008   Becky Sax, LPTA; CBIS [phone]  Juel Burrow 09/18/2015, 6:51 PM  [facility] [address] [place], [name], [address] Phone: [phone]   Fax:   [phone]  Name: Joel Rogers MRN: [id number] Date of Birth: 19-Mar-1951

## 2015-09-19 ENCOUNTER — Telehealth (HOSPITAL_COMMUNITY): Payer: Self-pay

## 2015-09-19 NOTE — Telephone Encounter (Signed)
09/19/15 called to let him know that he was discharged and I cancelled his last two appts that were on the schedule     M.A.

## 2015-09-23 ENCOUNTER — Ambulatory Visit (HOSPITAL_COMMUNITY): Payer: Medicare Other | Admitting: Physical Therapy

## 2015-09-23 DIAGNOSIS — Z471 Aftercare following joint replacement surgery: Secondary | ICD-10-CM | POA: Diagnosis not present

## 2015-09-23 DIAGNOSIS — Z96641 Presence of right artificial hip joint: Secondary | ICD-10-CM | POA: Diagnosis not present

## 2015-09-25 ENCOUNTER — Ambulatory Visit (HOSPITAL_COMMUNITY): Payer: Medicare Other | Admitting: Physical Therapy

## 2015-10-06 ENCOUNTER — Ambulatory Visit: Payer: Self-pay | Admitting: Pharmacist Clinician (PhC)/ Clinical Pharmacy Specialist

## 2015-10-06 DIAGNOSIS — Z7901 Long term (current) use of anticoagulants: Secondary | ICD-10-CM

## 2015-10-14 ENCOUNTER — Encounter: Payer: Self-pay | Admitting: Cardiovascular Disease

## 2015-10-14 ENCOUNTER — Ambulatory Visit (INDEPENDENT_AMBULATORY_CARE_PROVIDER_SITE_OTHER): Payer: Medicare Other | Admitting: Pharmacist Clinician (PhC)/ Clinical Pharmacy Specialist

## 2015-10-14 ENCOUNTER — Ambulatory Visit (INDEPENDENT_AMBULATORY_CARE_PROVIDER_SITE_OTHER): Payer: Medicare Other | Admitting: Cardiovascular Disease

## 2015-10-14 VITALS — BP 118/88 | HR 59 | Ht 72.0 in | Wt 303.0 lb

## 2015-10-14 DIAGNOSIS — I482 Chronic atrial fibrillation, unspecified: Secondary | ICD-10-CM

## 2015-10-14 DIAGNOSIS — I10 Essential (primary) hypertension: Secondary | ICD-10-CM

## 2015-10-14 DIAGNOSIS — E785 Hyperlipidemia, unspecified: Secondary | ICD-10-CM

## 2015-10-14 LAB — POCT INR: INR: 1.8

## 2015-10-14 MED ORDER — APIXABAN 5 MG PO TABS: 5.0000 mg | ORAL_TABLET | Freq: Two times a day (BID) | ORAL | Status: DC

## 2015-10-14 NOTE — Patient Instructions (Signed)
Medication Instructions:  1) STOP Coumadin 2) START Eliquis 5 mg by mouth TWICE daily Start Eliquis tonight, 11/22  Labwork: none  Testing/Procedures: none  Follow-Up: Your physician wants you to follow-up in: 12 months with Dr. Allyson SabalBerry.  You will receive a reminder letter in the mail two months in advance. If you don't receive a letter, please call our office to schedule the follow-up appointment.   Any Other Special Instructions Will Be Listed Below (If Applicable).     If you need a refill on your cardiac medications before your next appointment, please call your pharmacy.

## 2015-10-14 NOTE — Assessment & Plan Note (Signed)
History of hyperlipidemia on atorvastatin followed by his PCP 

## 2015-10-14 NOTE — Assessment & Plan Note (Signed)
History of chronic atrial fibrillation with controlled ventricular response on Coumadin anticoagulation. He wishes to transition to a NOAC . I will have Baxter HireKristen, our pharmacist, facilitate this.

## 2015-10-14 NOTE — Progress Notes (Signed)
10/14/2015 Joel Rogers   1951-11-08  [id number]  Primary Physician Kirk Ruths, MD Primary Cardiologist: Runell Gess MD [name]   HPI:   The patient is a very pleasant 64 year old, severely overweight, married, African American male, father of 2, grandfather to 4 grandchildren who I last saw 04/03/14. He has a history of chronic atrial fibrillation, rate controlled on Coumadin anticoagulation. He has obstructive sleep apnea on CPAP, hypertension, and hyperlipidemia. He was catheterized by Dr. Lavonne Chick in 2007 and found to have normal coronary arteries and normal coronary function. He had laparoscopic Roux-en-Y gastric bypass surgery in November of 2009 at [facility] and has lost over 140 pounds. He feels clinically improved. His last lipid profile performed a year ago was excellent with an LDL of 82, HDL of 52, total cholesterol 142. He complains of right greater than left lower extremity swelling, which has been chronic. He has some venous reflux in his lesser saphenous vein, probably not amenable to endovenous ablation. Since his last visit 04/03/14 he should remain critically stable. He does have lymphedema and is treated at the wound care center.  Current Outpatient Prescriptions  Medication Sig Dispense Refill  . atorvastatin (LIPITOR) 80 MG tablet TAKE ONE TABLET BY MOUTH ONCE DAILY AT BEDTIME 90 tablet 1  . Calcium Carbonate-Vitamin D (CALCIUM + D PO) Take 1 tablet by mouth daily.    Marland Kitchen doxycycline (VIBRAMYCIN) 100 MG capsule Take 100 mg by mouth 2 (two) times daily. Started on 08/29/14    . losartan (COZAAR) 100 MG tablet Take 1 tablet (100 mg total) by mouth daily. 30 tablet 0  . metoprolol (LOPRESSOR) 100 MG tablet Take 100 mg by mouth 2 (two) times daily.     Marland Kitchen torsemide (DEMADEX) 20 MG tablet Take 20 mg by mouth at bedtime.     Marland Kitchen warfarin (COUMADIN) 5 MG tablet Take 1-1.5 tablet by mouth once daily as directed by coumadin clinic. NEEDS INR CHECK FOR FUTURE  REFILLS. 40 tablet 0  . warfarin (COUMADIN) 5 MG tablet Take 1-1.5 tablets by mouth daily as directed by coumadin clinic 40 tablet 0   No current facility-administered medications for this visit.    No Known Allergies  Social History   Social History  . Marital Status: Married    Spouse Name: N/A  . Number of Children: 2  . Years of Education: N/A   Occupational History  . Not on file.   Social History Main Topics  . Smoking status: Never Smoker   . Smokeless tobacco: Not on file  . Alcohol Use: No  . Drug Use: No  . Sexual Activity: Not on file   Other Topics Concern  . Not on file   Social History Narrative     Review of Systems: General: negative for chills, fever, night sweats or weight changes.  Cardiovascular: negative for chest pain, dyspnea on exertion, edema, orthopnea, palpitations, paroxysmal nocturnal dyspnea or shortness of breath Dermatological: negative for rash Respiratory: negative for cough or wheezing Urologic: negative for hematuria Abdominal: negative for nausea, vomiting, diarrhea, bright red blood per rectum, melena, or hematemesis Neurologic: negative for visual changes, syncope, or dizziness All other systems reviewed and are otherwise negative except as noted above.    Blood pressure 118/88, pulse 59, height 6' (1.829 m), weight 303 lb (137.44 kg).  General appearance: alert and no distress Neck: no adenopathy, no carotid bruit, no JVD, supple, symmetrical, trachea midline and thyroid not enlarged, symmetric, no tenderness/mass/nodules Lungs:  clear to auscultation bilaterally Heart: irregularly irregular rhythm Extremities: marked bilateral lower extremity edema consistent with lymphedema  EKG H fibrillation with a controlled ventricular response of 59, right bundle branch block with typical repolarization changes. I personally reviewed this EKG  ASSESSMENT AND PLAN:   Hyperlipemia History of hyperlipidemia on atorvastatin followed by  his PCP  HTN (hypertension) History of hypertension blood pressure measured today at 118/88. He is on metoprolol and losartan. Continue current meds at current dosing  Atrial fibrillation History of chronic atrial fibrillation with controlled ventricular response on Coumadin anticoagulation. He wishes to transition to a NOAC . I will have Baxter HireKristen, our pharmacist, facilitate this.      Runell GessJonathan J. Thomasine Klutts MD FACP,FACC,FAHA, [facility] 10/14/2015 12:29 PM

## 2015-10-14 NOTE — Assessment & Plan Note (Signed)
History of hypertension blood pressure measured today at 118/88. He is on metoprolol and losartan. Continue current meds at current dosing

## 2015-10-15 ENCOUNTER — Telehealth: Payer: Self-pay

## 2015-10-15 NOTE — Telephone Encounter (Signed)
Prior auth for Eliquis 5 mg sent to [facility] 

## 2015-10-15 NOTE — Telephone Encounter (Signed)
Eliquis approved through 10/14/2016. PA # [id number].

## 2015-11-06 ENCOUNTER — Other Ambulatory Visit: Payer: Self-pay | Admitting: Cardiovascular Disease

## 2015-11-06 NOTE — Telephone Encounter (Signed)
Rx(s) sent to pharmacy electronically.  

## 2015-11-19 ENCOUNTER — Ambulatory Visit: Payer: Self-pay | Admitting: Pharmacist Clinician (PhC)/ Clinical Pharmacy Specialist

## 2015-11-19 DIAGNOSIS — Z7901 Long term (current) use of anticoagulants: Secondary | ICD-10-CM | POA: Diagnosis not present

## 2015-11-19 DIAGNOSIS — I482 Chronic atrial fibrillation, unspecified: Secondary | ICD-10-CM

## 2015-11-19 LAB — PROTIME-INR: INR: 1.3 — AB (ref 0.9–1.1)

## 2015-12-25 ENCOUNTER — Other Ambulatory Visit: Payer: Self-pay | Admitting: *Deleted

## 2015-12-25 MED ORDER — METOPROLOL TARTRATE 100 MG PO TABS: 100.0000 mg | ORAL_TABLET | Freq: Two times a day (BID) | ORAL | Status: DC

## 2015-12-25 MED ORDER — ATORVASTATIN CALCIUM 80 MG PO TABS: 80.0000 mg | ORAL_TABLET | Freq: Every day | ORAL | Status: DC

## 2015-12-25 MED ORDER — LOSARTAN POTASSIUM 100 MG PO TABS: 100.0000 mg | ORAL_TABLET | Freq: Every day | ORAL | Status: DC

## 2015-12-26 ENCOUNTER — Other Ambulatory Visit: Payer: Self-pay | Admitting: Cardiovascular Disease

## 2015-12-26 MED ORDER — ATORVASTATIN CALCIUM 80 MG PO TABS: 80.0000 mg | ORAL_TABLET | Freq: Every day | ORAL | Status: DC

## 2015-12-26 MED ORDER — LOSARTAN POTASSIUM 100 MG PO TABS: 100.0000 mg | ORAL_TABLET | Freq: Every day | ORAL | Status: DC

## 2015-12-26 MED ORDER — METOPROLOL TARTRATE 100 MG PO TABS: 100.0000 mg | ORAL_TABLET | Freq: Two times a day (BID) | ORAL | Status: DC

## 2015-12-30 ENCOUNTER — Other Ambulatory Visit: Payer: Self-pay | Admitting: Pharmacist Clinician (PhC)/ Clinical Pharmacy Specialist

## 2015-12-30 DIAGNOSIS — I4891 Unspecified atrial fibrillation: Secondary | ICD-10-CM

## 2015-12-30 MED ORDER — APIXABAN 5 MG PO TABS: 5.0000 mg | ORAL_TABLET | Freq: Two times a day (BID) | ORAL | Status: DC

## 2015-12-30 NOTE — Telephone Encounter (Signed)
Refilled Eliquis x 1, needs BMET to verify dose correct

## 2016-01-07 ENCOUNTER — Other Ambulatory Visit: Payer: Self-pay | Admitting: *Deleted

## 2016-01-22 LAB — LIPID PANEL
CHOLESTEROL: 170 mg/dL (ref 0–200)
HDL: 73 mg/dL — AB (ref 35–70)
LDL CALC: 86 mg/dL
TRIGLYCERIDES: 57 mg/dL (ref 40–160)

## 2016-01-22 LAB — MICROALBUMIN, URINE: Microalb, Ur: 150

## 2016-01-22 LAB — HEMOGLOBIN A1C: HEMOGLOBIN A1C: 6.2

## 2016-01-22 LAB — BASIC METABOLIC PANEL: Glucose: 96 mg/dL

## 2016-02-26 NOTE — Therapy (Signed)
[place], [place], [address] Phone: [phone]   Fax:  [phone]  Patient Details  Name: Joel Rogers MRN: [id number] Date of Birth: [birth year] Referring Provider:  Elsie Lincoln, MD  Encounter Date: 02/26/2016   PHYSICAL THERAPY DISCHARGE SUMMARY  Visits from Start of Care: 6  Current functional level related to goals / functional outcomes: Has not returned since last skilled session    Remaining deficits: Unable to assess    Education / Equipment: N/A  Plan: Patient agrees to discharge.  Patient goals were partially met. Patient is being discharged due to not returning since the last visit.  ?????       Deniece Ree PT, DPT [place] [address] [place], [place], [address] Phone: [phone]   Fax:  [phone]

## 2016-03-19 ENCOUNTER — Other Ambulatory Visit: Payer: Self-pay | Admitting: Cardiovascular Disease

## 2016-03-22 ENCOUNTER — Other Ambulatory Visit: Payer: Self-pay | Admitting: Pharmacist Clinician (PhC)/ Clinical Pharmacy Specialist

## 2016-03-22 DIAGNOSIS — I4891 Unspecified atrial fibrillation: Secondary | ICD-10-CM

## 2016-03-30 ENCOUNTER — Other Ambulatory Visit: Payer: Self-pay | Admitting: Cardiovascular Disease

## 2016-03-31 LAB — BASIC METABOLIC PANEL
BUN / CREAT RATIO: 14 (ref 10–24)
BUN: 15 mg/dL (ref 8–27)
CO2: 21 mmol/L (ref 18–29)
CREATININE: 1.06 mg/dL (ref 0.76–1.27)
Calcium: 8.6 mg/dL (ref 8.6–10.2)
Chloride: 104 mmol/L (ref 96–106)
GFR calc non Af Amer: 73 mL/min/{1.73_m2} (ref >59)
GFR, EST AFRICAN AMERICAN: 85 mL/min/{1.73_m2} (ref >59)
GLUCOSE: 86 mg/dL (ref 65–99)
Potassium: 3.9 mmol/L (ref 3.5–5.2)
Sodium: 144 mmol/L (ref 134–144)

## 2016-04-21 ENCOUNTER — Other Ambulatory Visit: Payer: Self-pay | Admitting: Cardiovascular Disease

## 2016-07-11 ENCOUNTER — Emergency Department (HOSPITAL_COMMUNITY): Payer: Commercial Managed Care - HMO

## 2016-07-11 ENCOUNTER — Emergency Department (HOSPITAL_COMMUNITY)
Admission: EM | Admit: 2016-07-11 | Discharge: 2016-07-11 | Disposition: A | Discharge status: Home / Self Care | Payer: Commercial Managed Care - HMO | Attending: Emergency Medicine | Admitting: Emergency Medicine

## 2016-07-11 ENCOUNTER — Encounter (HOSPITAL_COMMUNITY): Payer: Self-pay | Admitting: Emergency Medicine

## 2016-07-11 DIAGNOSIS — I10 Essential (primary) hypertension: Secondary | ICD-10-CM | POA: Insufficient documentation

## 2016-07-11 DIAGNOSIS — Z79899 Other long term (current) drug therapy: Secondary | ICD-10-CM | POA: Diagnosis not present

## 2016-07-11 DIAGNOSIS — M7989 Other specified soft tissue disorders: Secondary | ICD-10-CM | POA: Diagnosis not present

## 2016-07-11 DIAGNOSIS — R55 Syncope and collapse: Secondary | ICD-10-CM | POA: Diagnosis present

## 2016-07-11 DIAGNOSIS — E860 Dehydration: Secondary | ICD-10-CM | POA: Diagnosis not present

## 2016-07-11 LAB — URINALYSIS, ROUTINE W REFLEX MICROSCOPIC
BILIRUBIN URINE: NEGATIVE
Glucose, UA: NEGATIVE mg/dL
KETONES UR: NEGATIVE mg/dL
Leukocytes, UA: NEGATIVE
NITRITE: NEGATIVE
Protein, ur: >300 mg/dL — AB
Specific Gravity, Urine: >1.03 — ABNORMAL HIGH (ref 1.005–1.030)
pH: 6 (ref 5.0–8.0)

## 2016-07-11 LAB — BASIC METABOLIC PANEL
ANION GAP: 3 — AB (ref 5–15)
BUN: 20 mg/dL (ref 6–20)
CALCIUM: 8.2 mg/dL — AB (ref 8.9–10.3)
CO2: 27 mmol/L (ref 22–32)
Chloride: 109 mmol/L (ref 101–111)
Creatinine, Ser: 1.08 mg/dL (ref 0.61–1.24)
GFR calc Af Amer: >60 mL/min (ref >60)
GLUCOSE: 112 mg/dL — AB (ref 65–99)
Potassium: 3.8 mmol/L (ref 3.5–5.1)
SODIUM: 139 mmol/L (ref 135–145)

## 2016-07-11 LAB — CBC
HCT: 41.8 % (ref 39.0–52.0)
HEMOGLOBIN: 14.3 g/dL (ref 13.0–17.0)
MCH: 27.9 pg (ref 26.0–34.0)
MCHC: 34.2 g/dL (ref 30.0–36.0)
MCV: 81.6 fL (ref 78.0–100.0)
Platelets: 169 10*3/uL (ref 150–400)
RBC: 5.12 MIL/uL (ref 4.22–5.81)
RDW: 14.5 % (ref 11.5–15.5)
WBC: 8.5 10*3/uL (ref 4.0–10.5)

## 2016-07-11 LAB — URINE MICROSCOPIC-ADD ON

## 2016-07-11 LAB — CBG MONITORING, ED: GLUCOSE-CAPILLARY: 111 mg/dL — AB (ref 65–99)

## 2016-07-11 MED ORDER — SODIUM CHLORIDE 0.9 % IV BOLUS (SEPSIS)
500.0000 mL | Freq: Once | INTRAVENOUS | Status: AC
  Administered 2016-07-11: 500 mL via INTRAVENOUS

## 2016-07-11 NOTE — ED Triage Notes (Signed)
Per EMS:  PT was getting up to use bathroom this morning and had syncopal episode, passed out and hit top of head.  Pt not complaining of anything at this time.  Pt cbg 121.  Pt orthostatic with EMS.  Pt showed a-fib on monitor.   60 hr

## 2016-07-11 NOTE — ED Provider Notes (Signed)
[facility]-EMERGENCY DEPT Provider Note   CSN: [id number] Arrival date & time: 07/11/16  0910  By signing my name below, I, Joel SalisburyJoshua Rogers, attest that this documentation has been prepared under the direction and in the presence of Eber HongBrian Jesyka Slaght, MD . Electronically Signed: Nelwyn SalisburyJoshua Rogers, Scribe. 07/11/2016. 9:24 AM.  History   Chief Complaint Chief Complaint  Patient presents with  . Loss of Consciousness   The history is provided by the patient and the spouse. No language interpreter was used.     HPI Comments:  Joel LeachRalph L Rogers is a 65 y.o. male with PMHx of HTN, sleep apnea, and Afib who presents to the Emergency Department complaining of sudden LoC occuring earlier this morning. He indicates he sat up in bed for a moment when he felt the room was spinning and suddenly lost consciousness, when he regained consciousness he was on the floor. His wife reports that the Pt hit his head when he lost consciousness and was on the floor gurgling when she called EMS. She states he was out for about 10 minutes.Pt states he feels as though he may not have drank enough water last night. Pt reports leg swelling that has been present for a few weeks. Pt denies chest pain, back pain, neck pain, nausea, headache or insomnia.  He also reports recent changes in his eating habits. Pt states that after his Gastric Bypass surgery occurring in 09/2008 he began eating well and lost 150lbs. He states that he recently stopped eating healthy and has gained 44lbs in the past 7-8 months. Pt reports he has a PMHx of Afib and is followed by cardiology. He is compliant with Eliquis and a diuretic medications.    Past Medical History:  Diagnosis Date  . Atrial fibrillation (HCC)   . Hyperlipidemia   . Hypertension   . Normal coronary arteries    by cardiac catheterization performed 03/14/06  . Obesity   . Sleep apnea    on CPAP  . Venous insufficiency     Patient Active Problem List   Diagnosis Date Noted  . Weakness  of right leg 10/02/2013  . Difficulty in walking(719.7) 10/02/2013  . HTN (hypertension) 04/13/2013  . Hyperlipemia 04/13/2013  . Atrial fibrillation (HCC) 02/14/2013  . Long term (current) use of anticoagulants 02/14/2013  . MYOCARDIAL INFARCTION 06/25/2008  . EXOGENOUS OBESITY 06/18/2008  . OBSTRUCTIVE SLEEP APNEA 06/18/2008    Past Surgical History:  Procedure Laterality Date  . GASTRIC BYPASS  09/2008  . TOTAL HIP ARTHROPLASTY     right hip       Home Medications    Prior to Admission medications   Medication Sig Start Date End Date Taking? Authorizing Provider  apixaban (ELIQUIS) 5 MG TABS tablet Take 1 tablet (5 mg total) by mouth 2 (two) times daily. 12/30/15  Yes Runell GessJonathan J Berry, MD  atorvastatin (LIPITOR) 80 MG tablet Take 1 tablet (80 mg total) by mouth daily with breakfast. 12/26/15  Yes Runell GessJonathan J Berry, MD  Calcium Carbonate-Vitamin D (CALCIUM + D PO) Take 1 tablet by mouth daily.   Yes Historical Provider, MD  losartan (COZAAR) 50 MG tablet Take 50 mg by mouth daily.   Yes Historical Provider, MD  metoprolol (LOPRESSOR) 100 MG tablet Take 1 tablet (100 mg total) by mouth 2 (two) times daily. Patient taking differently: Take 50 mg by mouth 2 (two) times daily.  12/26/15  Yes Runell GessJonathan J Berry, MD  Multiple Vitamins-Minerals (MULTIVITAMINS THER. W/MINERALS) TABS tablet Take 1 tablet by  mouth daily.   Yes Historical Provider, MD  naproxen sodium (ANAPROX) 220 MG tablet Take 220 mg by mouth daily.   Yes Historical Provider, MD  torsemide (DEMADEX) 20 MG tablet Take 20 mg by mouth at bedtime.    Yes Historical Provider, MD  vitamin B-12 (CYANOCOBALAMIN) 100 MCG tablet Take 100 mcg by mouth daily.   Yes Historical Provider, MD    Family History Family History  Problem Relation Age of Onset  . Hypertension Father     Social History Social History  Substance Use Topics  . Smoking status: Never Smoker  . Smokeless tobacco: Not on file  . Alcohol use No      Allergies   Review of patient's allergies indicates no known allergies.   Review of Systems Review of Systems  Cardiovascular: Positive for leg swelling. Negative for chest pain.  Gastrointestinal: Negative for nausea.  Musculoskeletal: Negative for back pain and neck pain.  Neurological: Positive for syncope. Negative for headaches.  All other systems reviewed and are negative.    Physical Exam Updated Vital Signs BP 115/74   Pulse 67   Temp 97.8 F (36.6 C) (Oral)   Resp 16   Ht 6\' 1"  (1.854 m)   Wt (!) 309 lb (140.2 kg)   SpO2 100%   BMI 40.77 kg/m   Physical Exam  Constitutional: He appears well-developed and well-nourished. No distress.  HENT:  Head: Normocephalic and atraumatic.  Mouth/Throat: Oropharynx is clear and moist. No oropharyngeal exudate.  Eyes: Conjunctivae and EOM are normal. Pupils are equal, round, and reactive to light. Right eye exhibits no discharge. Left eye exhibits no discharge. No scleral icterus.  Neck: Normal range of motion. Neck supple. No JVD present. No thyromegaly present.  Cardiovascular: Intact distal pulses.  Exam reveals no gallop and no friction rub.   No murmur heard. Systolic heart murmur, Afib, Pulse in 60s. 1+ bilateral bilateral lower extremity edema  Pulmonary/Chest: Effort normal and breath sounds normal. No respiratory distress. He has no wheezes. He has no rales.  Abdominal: Soft. Bowel sounds are normal. He exhibits no distension and no mass. There is no tenderness.  Musculoskeletal: Normal range of motion. He exhibits no edema or tenderness.  Lymphadenopathy:    He has no cervical adenopathy.  Neurological: He is alert. Coordination normal.  Skin: Skin is warm and dry. No rash noted. No erythema.  Psychiatric: He has a normal mood and affect. His behavior is normal.  Nursing note and vitals reviewed.    ED Treatments / Results  DIAGNOSTIC STUDIES:  Oxygen Saturation is 97% on RA, normal by my  interpretation.    COORDINATION OF CARE:  9:23 AM Discussed treatment plan with pt at bedside which included Head CT and orthostatics and pt agreed to plan.  Labs (all labs ordered are listed, but only abnormal results are displayed) Labs Reviewed  BASIC METABOLIC PANEL - Abnormal; Notable for the following:       Result Value   Glucose, Bld 112 (*)    Calcium 8.2 (*)    Anion gap 3 (*)    All other components within normal limits  URINALYSIS, ROUTINE W REFLEX MICROSCOPIC (NOT AT [facility]) - Abnormal; Notable for the following:    Specific Gravity, Urine >1.030 (*)    Hgb urine dipstick TRACE (*)    Protein, ur >300 (*)    All other components within normal limits  URINE MICROSCOPIC-ADD ON - Abnormal; Notable for the following:    Squamous Epithelial /  LPF 0-5 (*)    Bacteria, UA FEW (*)    All other components within normal limits  CBG MONITORING, ED - Abnormal; Notable for the following:    Glucose-Capillary 111 (*)    All other components within normal limits  CBC    EKG  EKG Interpretation  Date/Time:  Sunday July 11 2016 09:21:18 EDT Ventricular Rate:  62 PR Interval:    QRS Duration: 177 QT Interval:  453 QTC Calculation: 460 R Axis:   29 Text Interpretation:  Atrial fibrillation Right bundle branch block Anteroseptal infarct, age indeterminate since last tracing no significant change Confirmed by Keyonta Barradas  MD, Andreka Stucki ([id number]) on 07/11/2016 11:00:37 AM       Radiology Ct Head Wo Contrast  Result Date: 07/11/2016 CLINICAL DATA:  Syncopal episode in the home. Fall with trauma to the vertex. EXAM: CT HEAD WITHOUT CONTRAST TECHNIQUE: Contiguous axial images were obtained from the base of the skull through the vertex without intravenous contrast. COMPARISON:  None. FINDINGS: Brain: Mild age related volume loss. No evidence of old or acute focal infarction, mass lesion, hemorrhage, hydrocephalus or extra-axial collection. Vascular: No hyperdense vessels. There is  atherosclerotic calcification of the major vessels at the base of the brain. Skull: No skull fracture. Sinuses/Orbits: Normal Other: None IMPRESSION: No acute or traumatic finding. Mild age related volume loss. Atherosclerotic calcification of the major vessels at the base of the brain. Electronically Signed   By: Paulina Fusi M.D.   On: 07/11/2016 11:00    Procedures Procedures (including critical care time)  Medications Ordered in ED Medications  sodium chloride 0.9 % bolus 500 mL (500 mLs Intravenous New Bag/Given 07/11/16 1205)     Initial Impression / Assessment and Plan / ED Course  I have reviewed the triage vital signs and the nursing notes.  Pertinent labs & imaging results that were available during my care of the patient were reviewed by me and considered in my medical decision making (see chart for details).  Clinical Course  Comment By Time  The pt was given 500cc IVF, his Orthostatics werwe normal - he has had no other symptoms during stay - he was informed that his urine had protein and high SG - he appears comfortable - has no other sx and is aware that he needs close f/u. Eber Hong, MD 08/20 1209   CT head negative as well  Final Clinical Impressions(s) / ED Diagnoses   Final diagnoses:  Syncope, unspecified syncope type  Dehydration    New Prescriptions New Prescriptions   No medications on file    I personally performed the services described in this documentation, which was scribed in my presence. The recorded information has been reviewed and is accurate.      Eber Hong, MD 07/11/16 1212

## 2016-07-11 NOTE — ED Notes (Signed)
MD at bedside. 

## 2016-07-11 NOTE — Discharge Instructions (Signed)
If you have chest pain, palpitations or any further episodes of fainting, you MUST return to the ER immediately.  See your doctor in next week for recheck - you should have your kidney function and your urine sample rechecked as you had some protein in your urine - this is abnormal.  Drink plenty of fluids this week.

## 2016-07-11 NOTE — ED Notes (Signed)
Patient denies pain and is resting comfortably.  

## 2016-07-11 NOTE — ED Notes (Signed)
Pt has small contusion to his left head. No bleeding is noted. Skin was not broken. Pt denies any pain.

## 2016-07-13 ENCOUNTER — Ambulatory Visit (INDEPENDENT_AMBULATORY_CARE_PROVIDER_SITE_OTHER): Payer: Commercial Managed Care - HMO | Admitting: Cardiovascular Disease

## 2016-07-13 ENCOUNTER — Encounter: Payer: Self-pay | Admitting: Cardiovascular Disease

## 2016-07-13 DIAGNOSIS — G4733 Obstructive sleep apnea (adult) (pediatric): Secondary | ICD-10-CM

## 2016-07-13 DIAGNOSIS — I10 Essential (primary) hypertension: Secondary | ICD-10-CM | POA: Diagnosis not present

## 2016-07-13 DIAGNOSIS — E785 Hyperlipidemia, unspecified: Secondary | ICD-10-CM | POA: Diagnosis not present

## 2016-07-13 DIAGNOSIS — I482 Chronic atrial fibrillation, unspecified: Secondary | ICD-10-CM

## 2016-07-13 DIAGNOSIS — R55 Syncope and collapse: Secondary | ICD-10-CM | POA: Insufficient documentation

## 2016-07-13 NOTE — Assessment & Plan Note (Signed)
History of chronic A. fib rate controlled on Coumadin anticoagulation. 

## 2016-07-13 NOTE — Patient Instructions (Signed)

## 2016-07-13 NOTE — Progress Notes (Signed)
07/13/2016 Joel LeachRalph L Rogers   Jun 18, 1951  [id number]  Primary Physician Kirk RuthsMCGOUGH,WILLIAM M, MD Primary Cardiologist: Runell GessJonathan J Ravin Bendall MD Nicholes CalamityFACP, FACC, FAHA, [place]  HPI:  The patient is a very pleasant 65 year old, severely overweight, married, African American male, father of 2, grandfather to 4 grandchildren who I last saw 10/14/15. He has a history of chronic atrial fibrillation, rate controlled on Coumadin anticoagulation. He has obstructive sleep apnea on CPAP, hypertension, and hyperlipidemia. He was catheterized by Dr. Lavonne ChickBill Gamble in 2007 and found to have normal coronary arteries and normal coronary function. He had laparoscopic Roux-en-Y gastric bypass [facility] in November of [facility] and has lost over 140 pounds. He feels clinically improved. His last lipid profile performed a year ago was excellent with an LDL of 82, HDL of 52, total cholesterol 142. He complains of right greater than left lower extremity swelling, which has been chronic. He has some venous reflux in his lesser saphenous vein, probably not amenable to endovenous ablation. Since I saw him 9 months ago he's remained stable. He recently had an episode of syncope in the morning while getting out of bed without other symptoms. [facility] room where his workup was unrevealing. He did receive 500 mL of intravenous fluid because of possible dehydration.  Current Outpatient Prescriptions  Medication Sig Dispense Refill  . apixaban (ELIQUIS) 5 MG TABS tablet Take 1 tablet (5 mg total) by mouth 2 (two) times daily. 180 tablet 0  . atorvastatin (LIPITOR) 80 MG tablet Take 1 tablet (80 mg total) by mouth daily with breakfast. 90 tablet 3  . Calcium Carbonate-Vitamin D (CALCIUM + D PO) Take 1 tablet by mouth daily.    Marland Kitchen. losartan (COZAAR) 50 MG tablet Take 50 mg by mouth daily.    . metoprolol (LOPRESSOR) 100 MG tablet Take 1 tablet (100 mg total) by mouth 2 (two) times daily. (Patient taking  differently: Take 50 mg by mouth 2 (two) times daily. ) 180 tablet 3  . Multiple Vitamins-Minerals (MULTIVITAMINS THER. W/MINERALS) TABS tablet Take 1 tablet by mouth daily.    . naproxen sodium (ANAPROX) 220 MG tablet Take 220 mg by mouth daily.    Marland Kitchen. torsemide (DEMADEX) 20 MG tablet Take 20 mg by mouth at bedtime.     . vitamin B-12 (CYANOCOBALAMIN) 100 MCG tablet Take 100 mcg by mouth daily.     No current facility-administered medications for this visit.     No Known Allergies  Social History   Social History  . Marital status: Married    Spouse name: N/A  . Number of children: 2  . Years of education: N/A   Occupational History  . Not on file.   Social History Main Topics  . Smoking status: Never Smoker  . Smokeless tobacco: Never Used  . Alcohol use No  . Drug use: No  . Sexual activity: Not on file   Other Topics Concern  . Not on file   Social History Narrative  . No narrative on file     Review of Systems: General: negative for chills, fever, night sweats or weight changes.  Cardiovascular: negative for chest pain, dyspnea on exertion, edema, orthopnea, palpitations, paroxysmal nocturnal dyspnea or shortness of breath Dermatological: negative for rash Respiratory: negative for cough or wheezing Urologic: negative for hematuria Abdominal: negative for nausea, vomiting, diarrhea, bright red blood per rectum, melena, or hematemesis Neurologic: negative for visual changes, syncope, or dizziness All other systems  reviewed and are otherwise negative except as noted above.    Blood pressure (!) 148/100, pulse (!) 59, height 6' (1.829 m), weight (!) 322 lb (146.1 kg).  General appearance: alert and no distress Neck: no adenopathy, no carotid bruit, no JVD, supple, symmetrical, trachea midline and thyroid not enlarged, symmetric, no tenderness/mass/nodules Lungs: clear to auscultation bilaterally Heart: regularly irregular rhythm Extremities: Chronic lymphedema  bilaterally  EKG not performed today. EKG performed 07/11/16 revealed A. fib with rapid ventricular response of 62 and right bundle branch block. I personally reviewed this EKG  ASSESSMENT AND PLAN:   Obstructive sleep apnea History of obstructive sleep apnea on C Pap Which she benefits from  Atrial fibrillation History of chronic A. fib rate controlled on Coumadin anticoagulation  HTN (hypertension) History of hypertension with blood pressure measures 140/100. He is on losartan and metoprolol. Continue current meds at current dosing  Hyperlipemia History of hyperlipidemia on statin therapy followed by his PCP  Syncope [name] had episode of syncope on 07/11/16. This occurred while getting out of bed in the morning. He lost consciousness and was taken [facility] room. His workup was unrevealing. He did receive 500 mL of intravenous fluids and his diagnosis was dehydration. His EKG revealed A. fib with controlled ventricular response. He had no other symptoms or findings. I have discussed with him the fact that he cannot drive for 6 months.      Runell GessJonathan J. Ciel Chervenak MD FACP,FACC,FAHA, [facility] 07/13/2016 9:54 AM

## 2016-07-13 NOTE — Assessment & Plan Note (Signed)
[name] had episode of syncope on 07/11/16. This occurred while getting out of bed in the morning. He lost consciousness and was taken to [facility] emergency room. His workup was unrevealing. He did receive 500 mL of intravenous fluids and his diagnosis was dehydration. His EKG revealed A. fib with controlled ventricular response. He had no other symptoms or findings. I have discussed with him the fact that he cannot drive for 6 months.

## 2016-07-13 NOTE — Assessment & Plan Note (Signed)
History of hypertension with blood pressure measures 140/100. He is on losartan and metoprolol. Continue current meds at current dosing

## 2016-07-13 NOTE — Assessment & Plan Note (Signed)
History of hyperlipidemia on statin therapy followed by his PCP 

## 2016-07-13 NOTE — Assessment & Plan Note (Addendum)
History of obstructive sleep apnea on C Pap Which she benefits from 

## 2016-08-04 ENCOUNTER — Ambulatory Visit: Payer: Commercial Managed Care - HMO

## 2016-08-26 ENCOUNTER — Ambulatory Visit: Payer: Commercial Managed Care - HMO

## 2016-08-26 ENCOUNTER — Inpatient Hospital Stay (HOSPITAL_COMMUNITY)
Admission: EM | Admit: 2016-08-26 | Discharge: 2016-08-31 | DRG: 026 | Disposition: A | Discharge status: Home / Self Care | Payer: Commercial Managed Care - HMO | Attending: Neurosurgery | Admitting: Neurosurgery

## 2016-08-26 ENCOUNTER — Emergency Department (HOSPITAL_COMMUNITY): Payer: Commercial Managed Care - HMO

## 2016-08-26 ENCOUNTER — Encounter (HOSPITAL_COMMUNITY): Payer: Self-pay | Admitting: Emergency Medicine

## 2016-08-26 DIAGNOSIS — Z9884 Bariatric surgery status: Secondary | ICD-10-CM

## 2016-08-26 DIAGNOSIS — G4730 Sleep apnea, unspecified: Secondary | ICD-10-CM | POA: Diagnosis present

## 2016-08-26 DIAGNOSIS — I6200 Nontraumatic subdural hemorrhage, unspecified: Principal | ICD-10-CM | POA: Diagnosis present

## 2016-08-26 DIAGNOSIS — S065XAA Traumatic subdural hemorrhage with loss of consciousness status unknown, initial encounter: Secondary | ICD-10-CM | POA: Diagnosis present

## 2016-08-26 DIAGNOSIS — Z79899 Other long term (current) drug therapy: Secondary | ICD-10-CM

## 2016-08-26 DIAGNOSIS — R531 Weakness: Secondary | ICD-10-CM | POA: Diagnosis present

## 2016-08-26 DIAGNOSIS — S065X9A Traumatic subdural hemorrhage with loss of consciousness of unspecified duration, initial encounter: Secondary | ICD-10-CM | POA: Diagnosis present

## 2016-08-26 DIAGNOSIS — Z23 Encounter for immunization: Secondary | ICD-10-CM | POA: Diagnosis not present

## 2016-08-26 DIAGNOSIS — I4891 Unspecified atrial fibrillation: Secondary | ICD-10-CM | POA: Diagnosis present

## 2016-08-26 DIAGNOSIS — E669 Obesity, unspecified: Secondary | ICD-10-CM | POA: Diagnosis present

## 2016-08-26 DIAGNOSIS — E785 Hyperlipidemia, unspecified: Secondary | ICD-10-CM | POA: Diagnosis present

## 2016-08-26 DIAGNOSIS — G8194 Hemiplegia, unspecified affecting left nondominant side: Secondary | ICD-10-CM | POA: Diagnosis present

## 2016-08-26 DIAGNOSIS — Z7901 Long term (current) use of anticoagulants: Secondary | ICD-10-CM | POA: Diagnosis not present

## 2016-08-26 DIAGNOSIS — I10 Essential (primary) hypertension: Secondary | ICD-10-CM | POA: Diagnosis present

## 2016-08-26 DIAGNOSIS — Z6841 Body Mass Index (BMI) 40.0 and over, adult: Secondary | ICD-10-CM | POA: Diagnosis not present

## 2016-08-26 DIAGNOSIS — I252 Old myocardial infarction: Secondary | ICD-10-CM | POA: Diagnosis not present

## 2016-08-26 DIAGNOSIS — R269 Unspecified abnormalities of gait and mobility: Secondary | ICD-10-CM

## 2016-08-26 LAB — CBC WITH DIFFERENTIAL/PLATELET
BASOS PCT: 1 %
Basophils Absolute: 0 10*3/uL (ref 0.0–0.1)
EOS ABS: 0 10*3/uL (ref 0.0–0.7)
EOS PCT: 1 %
HCT: 40.5 % (ref 39.0–52.0)
HEMOGLOBIN: 13.8 g/dL (ref 13.0–17.0)
LYMPHS ABS: 1.5 10*3/uL (ref 0.7–4.0)
Lymphocytes Relative: 20 %
MCH: 27.8 pg (ref 26.0–34.0)
MCHC: 34.1 g/dL (ref 30.0–36.0)
MCV: 81.5 fL (ref 78.0–100.0)
MONO ABS: 0.8 10*3/uL (ref 0.1–1.0)
MONOS PCT: 10 %
Neutro Abs: 5.3 10*3/uL (ref 1.7–7.7)
Neutrophils Relative %: 68 %
PLATELETS: 179 10*3/uL (ref 150–400)
RBC: 4.97 MIL/uL (ref 4.22–5.81)
RDW: 14.7 % (ref 11.5–15.5)
WBC: 7.7 10*3/uL (ref 4.0–10.5)

## 2016-08-26 LAB — COMPREHENSIVE METABOLIC PANEL
ALK PHOS: 71 U/L (ref 38–126)
ALT: 19 U/L (ref 17–63)
ANION GAP: 6 (ref 5–15)
AST: 27 U/L (ref 15–41)
Albumin: 3.8 g/dL (ref 3.5–5.0)
BUN: 13 mg/dL (ref 6–20)
CHLORIDE: 107 mmol/L (ref 101–111)
CO2: 27 mmol/L (ref 22–32)
CREATININE: 0.94 mg/dL (ref 0.61–1.24)
Calcium: 8.9 mg/dL (ref 8.9–10.3)
GFR calc non Af Amer: >60 mL/min (ref >60)
Glucose, Bld: 114 mg/dL — ABNORMAL HIGH (ref 65–99)
Potassium: 3.6 mmol/L (ref 3.5–5.1)
SODIUM: 140 mmol/L (ref 135–145)
Total Bilirubin: 1.1 mg/dL (ref 0.3–1.2)
Total Protein: 6.8 g/dL (ref 6.5–8.1)

## 2016-08-26 LAB — PROTIME-INR
INR: 1.13
Prothrombin Time: 14.6 seconds (ref 11.4–15.2)

## 2016-08-26 LAB — APTT: APTT: 34 s (ref 24–36)

## 2016-08-26 LAB — HEPARIN LEVEL (UNFRACTIONATED): HEPARIN UNFRACTIONATED: 0.36 [IU]/mL (ref 0.30–0.70)

## 2016-08-26 MED ORDER — PROTHROMBIN COMPLEX CONC HUMAN 500 UNITS IV KIT
5000.0000 [IU] | PACK | Status: AC
  Administered 2016-08-26: 5000 [IU] via INTRAVENOUS
  Filled 2016-08-26 (×2): qty 200

## 2016-08-26 NOTE — ED Provider Notes (Signed)
[facility]-EMERGENCY DEPT Provider Note   CSN: [id number] Arrival date & time: 08/26/16  2040  By signing my name below, I, Nelwyn Salisbury, attest that this documentation has been prepared under the direction and in the presence of Bethann Berkshire, MD . Electronically Signed: Nelwyn Salisbury, Scribe. 08/26/2016. 8:55 PM.  History   Chief Complaint Chief Complaint  Patient presents with  . Numbness   Patient states that he has a headache for 2 days and weakness in left leg for 2 days   The history is provided by the patient. No language interpreter was used.  Weakness  Primary symptoms include focal weakness. This is a new problem. The current episode started 2 days ago. The problem has not changed since onset.There was left lower extremity focality noted. There has been no fever. Associated symptoms include headaches. Pertinent negatives include no chest pain.   HPI Comments:  Joel Rogers is a 65 y.o. male with PMHx of HTN and HLD who presents to the Emergency Department complaining of sudden-onset constant unchanged lower left extremity weakness beginning 2 days ago. Pt states that he was out at a football game all night before the onset of his symptoms, and woke up experiencing the weakness. Pt endorses an associated headache that he states started around the same time as the weakness. He is compliant with his blood thinner medication, Eliquis.   Past Medical History:  Diagnosis Date  . Atrial fibrillation (HCC)   . Hyperlipidemia   . Hypertension   . Normal coronary arteries    by cardiac catheterization performed 03/14/06  . Obesity   . Sleep apnea    on CPAP  . Venous insufficiency     Patient Active Problem List   Diagnosis Date Noted  . Syncope 07/13/2016  . Weakness of right leg 10/02/2013  . Difficulty in walking(719.7) 10/02/2013  . HTN (hypertension) 04/13/2013  . Hyperlipemia 04/13/2013  . Atrial fibrillation (HCC) 02/14/2013  . Long term (current) use of  anticoagulants 02/14/2013  . MYOCARDIAL INFARCTION 06/25/2008  . EXOGENOUS OBESITY 06/18/2008  . Obstructive sleep apnea 06/18/2008    Past Surgical History:  Procedure Laterality Date  . GASTRIC BYPASS  09/2008  . TOTAL HIP ARTHROPLASTY     right hip       Home Medications    Prior to Admission medications   Medication Sig Start Date End Date Taking? Authorizing Provider  apixaban (ELIQUIS) 5 MG TABS tablet Take 1 tablet (5 mg total) by mouth 2 (two) times daily. 12/30/15   Runell Gess, MD  atorvastatin (LIPITOR) 80 MG tablet Take 1 tablet (80 mg total) by mouth daily with breakfast. 12/26/15   Runell Gess, MD  Calcium Carbonate-Vitamin D (CALCIUM + D PO) Take 1 tablet by mouth daily.    Historical Provider, MD  losartan (COZAAR) 50 MG tablet Take 50 mg by mouth daily.    Historical Provider, MD  metoprolol (LOPRESSOR) 100 MG tablet Take 1 tablet (100 mg total) by mouth 2 (two) times daily. Patient taking differently: Take 50 mg by mouth 2 (two) times daily.  12/26/15   Runell Gess, MD  Multiple Vitamins-Minerals (MULTIVITAMINS THER. W/MINERALS) TABS tablet Take 1 tablet by mouth daily.    Historical Provider, MD  naproxen sodium (ANAPROX) 220 MG tablet Take 220 mg by mouth daily.    Historical Provider, MD  torsemide (DEMADEX) 20 MG tablet Take 20 mg by mouth at bedtime.     Historical Provider, MD  vitamin B-12 (CYANOCOBALAMIN)  100 MCG tablet Take 100 mcg by mouth daily.    Historical Provider, MD    Family History Family History  Problem Relation Age of Onset  . Hypertension Father     Social History Social History  Substance Use Topics  . Smoking status: Never Smoker  . Smokeless tobacco: Never Used  . Alcohol use No     Allergies   Review of patient's allergies indicates no known allergies.   Review of Systems Review of Systems  Constitutional: Negative for appetite change and fatigue.  HENT: Negative for congestion, ear discharge and sinus  pressure.   Eyes: Negative for discharge.  Respiratory: Negative for cough.   Cardiovascular: Negative for chest pain.  Gastrointestinal: Negative for abdominal pain and diarrhea.  Genitourinary: Negative for frequency and hematuria.  Musculoskeletal: Negative for back pain.  Skin: Negative for rash.  Neurological: Positive for focal weakness, weakness and headaches. Negative for seizures.  Psychiatric/Behavioral: Negative for hallucinations.     Physical Exam Updated Vital Signs BP 157/84   Pulse 74   Temp 98.6 F (37 C)   Resp 22   Ht 6\' 1"  (1.854 m)   Wt (!) 311 lb (141.1 kg)   SpO2 100%   BMI 41.03 kg/m   Physical Exam  Constitutional: He is oriented to person, place, and time. He appears well-developed.  HENT:  Head: Normocephalic.  Eyes: Conjunctivae and EOM are normal. No scleral icterus.  Neck: Neck supple. No thyromegaly present.  Cardiovascular: Normal rate and regular rhythm.  Exam reveals no gallop and no friction rub.   No murmur heard. Pulmonary/Chest: No stridor. He has no wheezes. He has no rales. He exhibits no tenderness.  Abdominal: He exhibits no distension. There is no tenderness. There is no rebound.  Musculoskeletal: Normal range of motion. He exhibits no edema.  Lymphadenopathy:    He has no cervical adenopathy.  Neurological: He is oriented to person, place, and time. He exhibits normal muscle tone. Coordination normal.  Moderate weakness to left lower leg  Skin: No rash noted. No erythema.  Psychiatric: He has a normal mood and affect. His behavior is normal.     ED Treatments / Results  DIAGNOSTIC STUDIES:  Oxygen Saturation is 100% on RA, normal by my interpretation.    COORDINATION OF CARE:  9:02 PM Discussed treatment plan with pt at bedside which included imaging and pt agreed to plan.  Labs (all labs ordered are listed, but only abnormal results are displayed) Labs Reviewed - No data to display  EKG  EKG  Interpretation None       Radiology No results found.  Procedures Procedures (including critical care time)  Medications Ordered in ED Medications - No data to display   Initial Impression / Assessment and Plan / ED Course  I have reviewed the triage vital signs and the nursing notes.  Pertinent labs & imaging results that were available during my care of the patient were reviewed by me and considered in my medical decision making (see chart for details).  Clinical Course    CRITICAL CARE Performed by: Naftali Carchi L Total critical care time: 45 minutes Critical care time was exclusive of separately billable procedures and treating other patients. Critical care was necessary to treat or prevent imminent or life-threatening deterioration. Critical care was time spent personally by me on the following activities: development of treatment plan with patient and/or surrogate as well as nursing, discussions with consultants, evaluation of patient's response to treatment, examination of patient,  obtaining history from patient or surrogate, ordering and performing treatments and interventions, ordering and review of laboratory studies, ordering and review of radiographic studies, pulse oximetry and re-evaluation of patient's condition.   Final Clinical Impressions(s) / ED Diagnoses   Final diagnoses:  None  Patient has a subdural hematoma that large. I spoke with neurosurgery Dr. Venetia MaxonStern and he has accepted the patient to [facility]  New Prescriptions New Prescriptions   No medications on file      Bethann BerkshireJoseph Zebastian Carico, MD 08/26/16 2229

## 2016-08-26 NOTE — ED Triage Notes (Signed)
Pt states his left leg has been numb since Tuesday afternoon. Pt was having sleep study done tonight and was brought down to be checked out.

## 2016-08-26 NOTE — ED Notes (Signed)
Patient transported to CT 

## 2016-08-26 NOTE — ED Notes (Signed)
[facility] is on their way to transfer Pt to [facility].  Nurse informed.

## 2016-08-27 ENCOUNTER — Inpatient Hospital Stay (HOSPITAL_COMMUNITY): Payer: Commercial Managed Care - HMO | Admitting: Anesthesiology

## 2016-08-27 ENCOUNTER — Encounter (HOSPITAL_COMMUNITY): Payer: Self-pay | Admitting: Certified Registered Nurse Anesthetist

## 2016-08-27 ENCOUNTER — Encounter (HOSPITAL_COMMUNITY)
Admission: EM | Disposition: A | Discharge status: Home / Self Care | Payer: Self-pay | Source: Home / Self Care | Attending: Neurosurgery

## 2016-08-27 HISTORY — PX: CRANIOTOMY: SHX93

## 2016-08-27 LAB — PROTIME-INR
INR: 1.07
Prothrombin Time: 13.9 seconds (ref 11.4–15.2)

## 2016-08-27 LAB — GLUCOSE, CAPILLARY: GLUCOSE-CAPILLARY: 80 mg/dL (ref 65–99)

## 2016-08-27 LAB — TYPE AND SCREEN
ABO/RH(D): O POS
ANTIBODY SCREEN: NEGATIVE

## 2016-08-27 LAB — APTT: APTT: 36 s (ref 24–36)

## 2016-08-27 LAB — HEPARIN LEVEL (UNFRACTIONATED): HEPARIN UNFRACTIONATED: 0.27 [IU]/mL — AB (ref 0.30–0.70)

## 2016-08-27 LAB — MRSA PCR SCREENING: MRSA by PCR: NEGATIVE

## 2016-08-27 SURGERY — CRANIOTOMY HEMATOMA EVACUATION SUBDURAL
Anesthesia: General | Laterality: Right

## 2016-08-27 MED ORDER — FLEET ENEMA 7-19 GM/118ML RE ENEM: 1.0000 | ENEMA | Freq: Once | RECTAL | Status: DC | PRN

## 2016-08-27 MED ORDER — FAMOTIDINE IN NACL 20-0.9 MG/50ML-% IV SOLN
20.0000 mg | Freq: Two times a day (BID) | INTRAVENOUS | Status: DC
  Administered 2016-08-27 – 2016-08-29 (×5): 20 mg via INTRAVENOUS
  Filled 2016-08-27 (×5): qty 50

## 2016-08-27 MED ORDER — ONDANSETRON HCL 4 MG/2ML IJ SOLN
INTRAMUSCULAR | Status: AC
  Filled 2016-08-27: qty 2

## 2016-08-27 MED ORDER — BUPIVACAINE HCL (PF) 0.25 % IJ SOLN
INTRAMUSCULAR | Status: AC
  Filled 2016-08-27: qty 30

## 2016-08-27 MED ORDER — PHENYLEPHRINE HCL 10 MG/ML IJ SOLN
INTRAVENOUS | Status: DC | PRN
  Administered 2016-08-27: 40 ug/min via INTRAVENOUS

## 2016-08-27 MED ORDER — CALCIUM CARBONATE-VITAMIN D 500-200 MG-UNIT PO TABS
1.0000 | ORAL_TABLET | Freq: Every day | ORAL | Status: DC
  Administered 2016-08-28 – 2016-08-31 (×4): 1 via ORAL
  Filled 2016-08-27 (×6): qty 1

## 2016-08-27 MED ORDER — CALCIUM CITRATE-VITAMIN D 315-200 MG-UNIT PO TABS: 1.0000 | ORAL_TABLET | Freq: Every day | ORAL | Status: DC

## 2016-08-27 MED ORDER — LOSARTAN POTASSIUM 50 MG PO TABS
50.0000 mg | ORAL_TABLET | Freq: Every day | ORAL | Status: DC
  Administered 2016-08-27 – 2016-08-31 (×5): 50 mg via ORAL
  Filled 2016-08-27 (×5): qty 1

## 2016-08-27 MED ORDER — TORSEMIDE 20 MG PO TABS
20.0000 mg | ORAL_TABLET | Freq: Every day | ORAL | Status: DC
  Administered 2016-08-28 – 2016-08-29 (×2): 20 mg via ORAL
  Filled 2016-08-27 (×4): qty 1

## 2016-08-27 MED ORDER — SUGAMMADEX SODIUM 200 MG/2ML IV SOLN
INTRAVENOUS | Status: AC
  Filled 2016-08-27: qty 2

## 2016-08-27 MED ORDER — SENNOSIDES-DOCUSATE SODIUM 8.6-50 MG PO TABS: 1.0000 | ORAL_TABLET | Freq: Every evening | ORAL | Status: DC | PRN

## 2016-08-27 MED ORDER — ACETAMINOPHEN 650 MG RE SUPP: 650.0000 mg | Freq: Four times a day (QID) | RECTAL | Status: DC | PRN

## 2016-08-27 MED ORDER — DOCUSATE SODIUM 100 MG PO CAPS
100.0000 mg | ORAL_CAPSULE | Freq: Two times a day (BID) | ORAL | Status: DC
  Administered 2016-08-27 – 2016-08-31 (×9): 100 mg via ORAL
  Filled 2016-08-27 (×9): qty 1

## 2016-08-27 MED ORDER — LABETALOL HCL 5 MG/ML IV SOLN
10.0000 mg | INTRAVENOUS | Status: AC | PRN
  Administered 2016-08-27 (×2): 10 mg via INTRAVENOUS

## 2016-08-27 MED ORDER — BISACODYL 10 MG RE SUPP: 10.0000 mg | Freq: Every day | RECTAL | Status: DC | PRN

## 2016-08-27 MED ORDER — THROMBIN 5000 UNITS EX SOLR
CUTANEOUS | Status: AC
  Filled 2016-08-27: qty 10000

## 2016-08-27 MED ORDER — LACTATED RINGERS IV SOLN
INTRAVENOUS | Status: DC | PRN
  Administered 2016-08-27 (×3): via INTRAVENOUS

## 2016-08-27 MED ORDER — POTASSIUM CHLORIDE IN NACL 20-0.9 MEQ/L-% IV SOLN
INTRAVENOUS | Status: DC
Start: 2016-08-27 — End: 2016-08-28
  Administered 2016-08-27 (×2): via INTRAVENOUS
  Filled 2016-08-27: qty 1000

## 2016-08-27 MED ORDER — ONDANSETRON HCL 4 MG PO TABS: 4.0000 mg | ORAL_TABLET | Freq: Four times a day (QID) | ORAL | Status: DC | PRN

## 2016-08-27 MED ORDER — MORPHINE SULFATE (PF) 2 MG/ML IV SOLN: 1.0000 mg | INTRAVENOUS | Status: DC | PRN

## 2016-08-27 MED ORDER — SUGAMMADEX SODIUM 200 MG/2ML IV SOLN
INTRAVENOUS | Status: DC | PRN
  Administered 2016-08-27: 200 mg via INTRAVENOUS
  Administered 2016-08-27: 100 mg via INTRAVENOUS

## 2016-08-27 MED ORDER — EPHEDRINE 5 MG/ML INJ
INTRAVENOUS | Status: AC
  Filled 2016-08-27: qty 10

## 2016-08-27 MED ORDER — LABETALOL HCL 5 MG/ML IV SOLN
INTRAVENOUS | Status: AC
  Administered 2016-08-27: 10 mg via INTRAVENOUS
  Filled 2016-08-27: qty 4

## 2016-08-27 MED ORDER — ACETAMINOPHEN 650 MG RE SUPP: 650.0000 mg | RECTAL | Status: DC | PRN

## 2016-08-27 MED ORDER — PROMETHAZINE HCL 25 MG PO TABS: 12.5000 mg | ORAL_TABLET | ORAL | Status: DC | PRN

## 2016-08-27 MED ORDER — LIDOCAINE-EPINEPHRINE 1 %-1:100000 IJ SOLN
INTRAMUSCULAR | Status: DC | PRN
  Administered 2016-08-27: 8 mL

## 2016-08-27 MED ORDER — ATORVASTATIN CALCIUM 80 MG PO TABS
80.0000 mg | ORAL_TABLET | Freq: Every day | ORAL | Status: DC
  Administered 2016-08-27 – 2016-08-30 (×4): 80 mg via ORAL
  Filled 2016-08-27 (×4): qty 1

## 2016-08-27 MED ORDER — ONDANSETRON HCL 4 MG/2ML IJ SOLN: 4.0000 mg | Freq: Four times a day (QID) | INTRAMUSCULAR | Status: DC | PRN

## 2016-08-27 MED ORDER — PROPOFOL 10 MG/ML IV BOLUS
INTRAVENOUS | Status: AC
  Filled 2016-08-27: qty 20

## 2016-08-27 MED ORDER — FENTANYL CITRATE (PF) 100 MCG/2ML IJ SOLN
INTRAMUSCULAR | Status: AC
  Filled 2016-08-27: qty 4

## 2016-08-27 MED ORDER — MUPIROCIN 2 % EX OINT
TOPICAL_OINTMENT | CUTANEOUS | Status: AC
  Filled 2016-08-27: qty 22

## 2016-08-27 MED ORDER — LIDOCAINE-EPINEPHRINE 1 %-1:100000 IJ SOLN
INTRAMUSCULAR | Status: AC
  Filled 2016-08-27: qty 1

## 2016-08-27 MED ORDER — METOPROLOL TARTRATE 50 MG PO TABS
50.0000 mg | ORAL_TABLET | Freq: Two times a day (BID) | ORAL | Status: DC
  Administered 2016-08-27 – 2016-08-31 (×10): 50 mg via ORAL
  Filled 2016-08-27 (×10): qty 1

## 2016-08-27 MED ORDER — THROMBIN 20000 UNITS EX SOLR
CUTANEOUS | Status: AC
  Filled 2016-08-27: qty 20000

## 2016-08-27 MED ORDER — ONDANSETRON HCL 4 MG PO TABS: 4.0000 mg | ORAL_TABLET | ORAL | Status: DC | PRN

## 2016-08-27 MED ORDER — BUPIVACAINE HCL (PF) 0.25 % IJ SOLN
INTRAMUSCULAR | Status: DC | PRN
  Administered 2016-08-27: 8 mL

## 2016-08-27 MED ORDER — THROMBIN 20000 UNITS EX SOLR
CUTANEOUS | Status: DC | PRN
  Administered 2016-08-27: 12:00:00 via TOPICAL

## 2016-08-27 MED ORDER — LABETALOL HCL 5 MG/ML IV SOLN
INTRAVENOUS | Status: DC | PRN
  Administered 2016-08-27 (×4): 5 mg via INTRAVENOUS

## 2016-08-27 MED ORDER — SODIUM CHLORIDE 0.9 % IV SOLN
500.0000 mg | Freq: Two times a day (BID) | INTRAVENOUS | Status: DC
  Administered 2016-08-27 – 2016-08-30 (×6): 500 mg via INTRAVENOUS
  Filled 2016-08-27 (×8): qty 5

## 2016-08-27 MED ORDER — MIDAZOLAM HCL 2 MG/2ML IJ SOLN
INTRAMUSCULAR | Status: AC
Start: 2016-08-27 — End: 2016-08-27
  Filled 2016-08-27: qty 2

## 2016-08-27 MED ORDER — BACITRACIN ZINC 500 UNIT/GM EX OINT
TOPICAL_OINTMENT | CUTANEOUS | Status: AC
  Filled 2016-08-27: qty 28.35

## 2016-08-27 MED ORDER — BACITRACIN ZINC 500 UNIT/GM EX OINT
TOPICAL_OINTMENT | CUTANEOUS | Status: DC | PRN
  Administered 2016-08-27: 1 via TOPICAL

## 2016-08-27 MED ORDER — ONDANSETRON HCL 4 MG/2ML IJ SOLN
INTRAMUSCULAR | Status: DC | PRN
  Administered 2016-08-27: 4 mg via INTRAVENOUS

## 2016-08-27 MED ORDER — VITAMIN E 180 MG (400 UNIT) PO CAPS
400.0000 [IU] | ORAL_CAPSULE | Freq: Every day | ORAL | Status: DC
  Administered 2016-08-27 – 2016-08-31 (×5): 400 [IU] via ORAL
  Filled 2016-08-27 (×5): qty 1

## 2016-08-27 MED ORDER — ONDANSETRON HCL 4 MG/2ML IJ SOLN: 4.0000 mg | INTRAMUSCULAR | Status: DC | PRN

## 2016-08-27 MED ORDER — ROCURONIUM BROMIDE 100 MG/10ML IV SOLN
INTRAVENOUS | Status: DC | PRN
  Administered 2016-08-27: 5 mg via INTRAVENOUS
  Administered 2016-08-27: 50 mg via INTRAVENOUS

## 2016-08-27 MED ORDER — FENTANYL CITRATE (PF) 100 MCG/2ML IJ SOLN
INTRAMUSCULAR | Status: DC | PRN
  Administered 2016-08-27 (×2): 50 ug via INTRAVENOUS
  Administered 2016-08-27: 100 ug via INTRAVENOUS

## 2016-08-27 MED ORDER — ACETAMINOPHEN 325 MG PO TABS: 650.0000 mg | ORAL_TABLET | ORAL | Status: DC | PRN

## 2016-08-27 MED ORDER — HYDROMORPHONE HCL 1 MG/ML IJ SOLN: 0.2500 mg | INTRAMUSCULAR | Status: DC | PRN

## 2016-08-27 MED ORDER — LIDOCAINE HCL (CARDIAC) 20 MG/ML IV SOLN
INTRAVENOUS | Status: DC | PRN
  Administered 2016-08-27: 100 mg via INTRAVENOUS

## 2016-08-27 MED ORDER — PHENYLEPHRINE HCL 10 MG/ML IJ SOLN
INTRAMUSCULAR | Status: DC | PRN
  Administered 2016-08-27: 40 ug via INTRAVENOUS

## 2016-08-27 MED ORDER — PROMETHAZINE HCL 25 MG/ML IJ SOLN: 6.2500 mg | INTRAMUSCULAR | Status: DC | PRN

## 2016-08-27 MED ORDER — CEFAZOLIN SODIUM-DEXTROSE 2-4 GM/100ML-% IV SOLN
2.0000 g | Freq: Three times a day (TID) | INTRAVENOUS | Status: AC
  Administered 2016-08-27 – 2016-08-28 (×2): 2 g via INTRAVENOUS
  Filled 2016-08-27 (×3): qty 100

## 2016-08-27 MED ORDER — PROPOFOL 10 MG/ML IV BOLUS
INTRAVENOUS | Status: DC | PRN
  Administered 2016-08-27: 200 mg via INTRAVENOUS

## 2016-08-27 MED ORDER — ACETAMINOPHEN 325 MG PO TABS: 650.0000 mg | ORAL_TABLET | Freq: Four times a day (QID) | ORAL | Status: DC | PRN

## 2016-08-27 MED ORDER — GLYCOPYRROLATE 0.2 MG/ML IV SOSY
PREFILLED_SYRINGE | INTRAVENOUS | Status: DC | PRN
  Administered 2016-08-27 (×2): .2 mg via INTRAVENOUS

## 2016-08-27 MED ORDER — LABETALOL HCL 5 MG/ML IV SOLN
10.0000 mg | INTRAVENOUS | Status: DC | PRN
Start: 2016-08-27 — End: 2016-08-31
  Administered 2016-08-27 – 2016-08-28 (×4): 10 mg via INTRAVENOUS
  Filled 2016-08-27 (×3): qty 4

## 2016-08-27 MED ORDER — SUCCINYLCHOLINE CHLORIDE 200 MG/10ML IV SOSY
PREFILLED_SYRINGE | INTRAVENOUS | Status: AC
  Filled 2016-08-27: qty 10

## 2016-08-27 MED ORDER — HYDROCODONE-ACETAMINOPHEN 5-325 MG PO TABS
1.0000 | ORAL_TABLET | ORAL | Status: DC | PRN
  Administered 2016-08-27 – 2016-08-30 (×6): 1 via ORAL
  Filled 2016-08-27 (×6): qty 1

## 2016-08-27 MED ORDER — 0.9 % SODIUM CHLORIDE (POUR BTL) OPTIME
TOPICAL | Status: DC | PRN
  Administered 2016-08-27 (×4): 1000 mL

## 2016-08-27 MED ORDER — DEXTROSE 5 % IV SOLN
3.0000 g | INTRAVENOUS | Status: AC
  Administered 2016-08-27: 3 g via INTRAVENOUS
  Filled 2016-08-27: qty 3000

## 2016-08-27 MED ORDER — MEPERIDINE HCL 25 MG/ML IJ SOLN: 6.2500 mg | INTRAMUSCULAR | Status: DC | PRN

## 2016-08-27 MED ORDER — POTASSIUM CHLORIDE IN NACL 20-0.9 MEQ/L-% IV SOLN
INTRAVENOUS | Status: DC
  Administered 2016-08-27 – 2016-08-28 (×2): via INTRAVENOUS
  Filled 2016-08-27 (×3): qty 1000

## 2016-08-27 SURGICAL SUPPLY — 66 items
BANDAGE GAUZE 4  KLING STR (GAUZE/BANDAGES/DRESSINGS) IMPLANT
BIT DRILL WIRE PASS 1.3MM (BIT) IMPLANT
BNDG GAUZE ELAST 4 BULKY (GAUZE/BANDAGES/DRESSINGS) IMPLANT
BUR ACORN 6.0 PRECISION (BURR) ×4 IMPLANT
BUR ACORN 6.0MM PRECISION (BURR) ×2
BUR SPIRAL ROUTER 2.3 (BUR) IMPLANT
BUR SPIRAL ROUTER 2.3MM (BUR)
CANISTER SUCT 3000ML PPV (MISCELLANEOUS) ×3 IMPLANT
CATH ROBINSON RED A/P 12FR (CATHETERS) IMPLANT
CLIP TI MEDIUM 6 (CLIP) IMPLANT
DECANTER SPIKE VIAL GLASS SM (MISCELLANEOUS) ×3 IMPLANT
DRAIN PENROSE 1/2X12 LTX STRL (WOUND CARE) IMPLANT
DRAPE NEUROLOGICAL W/INCISE (DRAPES) ×3 IMPLANT
DRAPE WARM FLUID 44X44 (DRAPE) ×3 IMPLANT
DRILL WIRE PASS 1.3MM (BIT)
DRSG PAD ABDOMINAL 8X10 ST (GAUZE/BANDAGES/DRESSINGS) IMPLANT
DURAPREP 6ML APPLICATOR 50/CS (WOUND CARE) ×3 IMPLANT
ELECT CAUTERY BLADE 6.4 (BLADE) ×3 IMPLANT
ELECT REM PT RETURN 9FT ADLT (ELECTROSURGICAL) ×3
ELECTRODE REM PT RTRN 9FT ADLT (ELECTROSURGICAL) ×1 IMPLANT
EVACUATOR SILICONE 100CC (DRAIN) IMPLANT
GAUZE SPONGE 4X4 12PLY STRL (GAUZE/BANDAGES/DRESSINGS) IMPLANT
GAUZE SPONGE 4X4 16PLY XRAY LF (GAUZE/BANDAGES/DRESSINGS) IMPLANT
GLOVE BIO SURGEON STRL SZ8 (GLOVE) ×3 IMPLANT
GLOVE BIOGEL PI IND STRL 8 (GLOVE) ×1 IMPLANT
GLOVE BIOGEL PI IND STRL 8.5 (GLOVE) ×1 IMPLANT
GLOVE BIOGEL PI INDICATOR 8 (GLOVE) ×2
GLOVE BIOGEL PI INDICATOR 8.5 (GLOVE) ×2
GLOVE ECLIPSE 7.5 STRL STRAW (GLOVE) ×3 IMPLANT
GLOVE EXAM NITRILE LRG STRL (GLOVE) IMPLANT
GLOVE EXAM NITRILE XL STR (GLOVE) IMPLANT
GLOVE EXAM NITRILE XS STR PU (GLOVE) IMPLANT
GOWN STRL REUS W/ TWL LRG LVL3 (GOWN DISPOSABLE) IMPLANT
GOWN STRL REUS W/ TWL XL LVL3 (GOWN DISPOSABLE) IMPLANT
GOWN STRL REUS W/TWL 2XL LVL3 (GOWN DISPOSABLE) IMPLANT
GOWN STRL REUS W/TWL LRG LVL3 (GOWN DISPOSABLE)
GOWN STRL REUS W/TWL XL LVL3 (GOWN DISPOSABLE)
HEMOSTAT SURGICEL 2X14 (HEMOSTASIS) ×3 IMPLANT
KIT BASIN OR (CUSTOM PROCEDURE TRAY) ×3 IMPLANT
KIT ROOM TURNOVER OR (KITS) ×3 IMPLANT
NEEDLE HYPO 25X1 1.5 SAFETY (NEEDLE) ×3 IMPLANT
NS IRRIG 1000ML POUR BTL (IV SOLUTION) ×3 IMPLANT
PACK CRANIOTOMY (CUSTOM PROCEDURE TRAY) ×3 IMPLANT
PAD ARMBOARD 7.5X6 YLW CONV (MISCELLANEOUS) ×3 IMPLANT
PATTIES SURGICAL .5 X.5 (GAUZE/BANDAGES/DRESSINGS) IMPLANT
PATTIES SURGICAL .5 X3 (DISPOSABLE) IMPLANT
PATTIES SURGICAL 1X1 (DISPOSABLE) IMPLANT
PIN MAYFIELD SKULL DISP (PIN) IMPLANT
PLATE 1.5  2HOLE LNG NEURO (Plate) ×6 IMPLANT
PLATE 1.5 2HOLE LNG NEURO (Plate) ×3 IMPLANT
SCREW SELF DRILL HT 1.5/4MM (Screw) ×9 IMPLANT
SPECIMEN JAR SMALL (MISCELLANEOUS) IMPLANT
SPONGE NEURO XRAY DETECT 1X3 (DISPOSABLE) IMPLANT
SPONGE SURGIFOAM ABS GEL 12-7 (HEMOSTASIS) IMPLANT
STAPLER SKIN PROX WIDE 3.9 (STAPLE) ×3 IMPLANT
SUT ETHILON 3 0 PS 1 (SUTURE) IMPLANT
SUT NURALON 4 0 TR CR/8 (SUTURE) ×6 IMPLANT
SUT VIC AB 2-0 CP2 18 (SUTURE) ×6 IMPLANT
SUT VIC AB 3-0 SH 8-18 (SUTURE) IMPLANT
SYR CONTROL 10ML LL (SYRINGE) ×3 IMPLANT
TOWEL OR 17X24 6PK STRL BLUE (TOWEL DISPOSABLE) ×3 IMPLANT
TOWEL OR 17X26 10 PK STRL BLUE (TOWEL DISPOSABLE) ×3 IMPLANT
TRAP SPECIMEN MUCOUS 40CC (MISCELLANEOUS) IMPLANT
TRAY FOLEY W/METER SILVER 16FR (SET/KITS/TRAYS/PACK) IMPLANT
UNDERPAD 30X30 (UNDERPADS AND DIAPERS) IMPLANT
WATER STERILE IRR 1000ML POUR (IV SOLUTION) ×3 IMPLANT

## 2016-08-27 NOTE — Progress Notes (Signed)
eLink Physician-Brief Progress Note Patient Name: Joel LeachRalph L Rogers DOB: [birth year] MRN: [id number]   Date of Service  08/27/2016  HPI/Events of Note  New patient evaluation.  [facility] for  neurosurgical procedure.  Patient known to have hypertension, atrial fibrillation, on ELIQUIS, who presented to the emergency room complaining of left lower extremity weakness and headache.  Cranial CT scan showed subacute right subdural hematoma, 2 cm with a 1.3 cm right to left midline shift.  ER spoke to neurosurgery for possible surgical intervention. Neurosurgery agreed to the transfer.   Patient seen, comfortable, not in distress.  Blood pressure 160/90, heart rate 74, respiratory rate 16, sats 99% on room air.  Some headache on his right side.  Denies nausea, vomiting, chest pain, dyspnea.   eICU Interventions  Continue current management.  Patient received Armandina StammerK [facility] at the ED.         Shandi Godfrey [address]Angelo A De Dios 08/27/2016, 1:17 AM

## 2016-08-27 NOTE — Anesthesia Preprocedure Evaluation (Addendum)
Anesthesia Evaluation  Patient identified by MRN, date of birth, ID band Patient awake    Reviewed: Allergy & Precautions, NPO status , Patient's Chart, lab work & pertinent test results  Airway Mallampati: II  TM Distance: >3 FB Neck ROM: Full    Dental no notable dental hx.    Pulmonary sleep apnea ,    Pulmonary exam normal breath sounds clear to auscultation       Cardiovascular hypertension, Pt. on medications + Past MI and + Peripheral Vascular Disease  + Valvular Problems/Murmurs  Rhythm:Irregular Rate:Normal + Systolic murmurs III/VI holosystolic SEM   Neuro/Psych negative neurological ROS  negative psych ROS   GI/Hepatic negative GI ROS, Neg liver ROS,   Endo/Other  Morbid obesity  Renal/GU negative Renal ROS     Musculoskeletal negative musculoskeletal ROS (+)   Abdominal   Peds  Hematology negative hematology ROS (+)   Anesthesia Other Findings   Reproductive/Obstetrics negative OB ROS                            Anesthesia Physical Anesthesia Plan  ASA: III  Anesthesia Plan: General   Post-op Pain Management:    Induction: Intravenous  Airway Management Planned: Oral ETT  Additional Equipment: Arterial line  Intra-op Plan:   Post-operative Plan: Extubation in OR  Informed Consent: I have reviewed the patients History and Physical, chart, labs and discussed the procedure including the risks, benefits and alternatives for the proposed anesthesia with the patient or authorized representative who has indicated his/her understanding and acceptance.   Dental advisory given  Plan Discussed with: CRNA  Anesthesia Plan Comments: (2 x PIV, +/- CVL)      Anesthesia Quick Evaluation

## 2016-08-27 NOTE — Brief Op Note (Signed)
08/26/2016 - 08/27/2016  1:13 PM  PATIENT:  Joel Rogers  65 y.o. male  PRE-OPERATIVE DIAGNOSIS:  subdural  bleed with hemiparesis and anti-coagulation  POST-OPERATIVE DIAGNOSIS:   subdural  bleed with hemiparesis and anti-coagulation   PROCEDURE:  Procedure(s): CRANIOTOMY HEMATOMA EVACUATION SUBDURAL (Right)  SURGEON:  Surgeon(s) and Role:    * Braelee Herrle, MD - Primary      PHYSICIAN ASSISTANT: Jones, MD  ASSISTANTS:Poteat, RN   ANESTHESIA:   general  EBL:  Total I/O In: 1125 [I.V.:1125] Out: -   BLOOD ADMINISTERED:none  DRAINS: (10) Jackson-Pratt drain(s) with closed bulb suction in the subdural space   LOCAL MEDICATIONS USED:  MARCAINE    and LIDOCAINE   SPECIMEN:  No Specimen  DISPOSITION OF SPECIMEN:  N/A  COUNTS:  YES  TOURNIQUET:  * No tourniquets in log *  DICTATION: Patient is 65 year old man who fell and struck his head. He was on Eliquis for atrial fibrillation.  The patient presents with worsening headache and left hemiparesis.  Head CT shows panhemispheric right SDH with mass effect and 1.3 cm shift. It was elected to take patient to surgery for right craniotomy for SDH.  Procedure:  Following smooth intubation, patient was placed in left semi-lateral position with blanket roll.  Head was placed on donut head holder and right frontal scalp was shaved and prepped and draped in usual sterile fashion.  Area of planned incision was infiltrated with lidocaine. A curvilinear incision was made and carried through temporalis fascia and muscle to expose calvarium.  Skull flap was elevated exposing subdural hematoma.  Dura was opened and subdural was evacuated.  This was crank oil in color and under pressure.   This was evacuated and a considerable amount of blood was removed. There were thick membranes and multiple layers of SDH. The brain was irrigated.    Hemostasis was assured. The brain came back up after hematoma evacuation.  The dura was closed with 4-0  neurilon sutures after placing a #10 JP drain, bone flap was replaced with plates, the fascia and galea were closed with 2-0 vicryl sutures and the skin was re approximated with staples.  A sterile occlusive dressing was placed.  Patient was extubated and taken to recovery in stable condition having tolerated his operation well.   PLAN OF CARE: Admit to inpatient   PATIENT DISPOSITION:  PACU - hemodynamically stable.   Delay start of Pharmacological VTE agent (>24hrs) due to surgical blood loss or risk of bleeding: yes  

## 2016-08-27 NOTE — Op Note (Signed)
08/26/2016 - 08/27/2016  1:13 PM  PATIENT:  Joel Rogers  65 y.o. male  PRE-OPERATIVE DIAGNOSIS:  subdural  bleed with hemiparesis and anti-coagulation  POST-OPERATIVE DIAGNOSIS:   subdural  bleed with hemiparesis and anti-coagulation   PROCEDURE:  Procedure(s): CRANIOTOMY HEMATOMA EVACUATION SUBDURAL (Right)  SURGEON:  Surgeon(s) and Role:    * Maeola HarmanJoseph Sehar Sedano, MD - Primary      PHYSICIAN ASSISTANT: Yetta BarreJones, MD  ASSISTANTS:Poteat, RN   ANESTHESIA:   general  EBL:  Total I/O In: 1125 [I.V.:1125] Out: -   BLOOD ADMINISTERED:none  DRAINS: (10) Jackson-Pratt drain(s) with closed bulb suction in the subdural space   LOCAL MEDICATIONS USED:  MARCAINE    and LIDOCAINE   SPECIMEN:  No Specimen  DISPOSITION OF SPECIMEN:  N/A  COUNTS:  YES  TOURNIQUET:  * No tourniquets in log *  DICTATION: Patient is 65 year old man who fell and struck his head. He was on Eliquis for atrial fibrillation.  The patient presents with worsening headache and left hemiparesis.  Head CT shows panhemispheric right SDH with mass effect and 1.3 cm shift. It was elected to take patient to surgery for right craniotomy for SDH.  Procedure:  Following smooth intubation, patient was placed in left semi-lateral position with blanket roll.  Head was placed on donut head holder and right frontal scalp was shaved and prepped and draped in usual sterile fashion.  Area of planned incision was infiltrated with lidocaine. A curvilinear incision was made and carried through temporalis fascia and muscle to expose calvarium.  Skull flap was elevated exposing subdural hematoma.  Dura was opened and subdural was evacuated.  This was crank oil in color and under pressure.   This was evacuated and a considerable amount of blood was removed. There were thick membranes and multiple layers of SDH. The brain was irrigated.    Hemostasis was assured. The brain came back up after hematoma evacuation.  The dura was closed with 4-0  neurilon sutures after placing a #10 JP drain, bone flap was replaced with plates, the fascia and galea were closed with 2-0 vicryl sutures and the skin was re approximated with staples.  A sterile occlusive dressing was placed.  Patient was extubated and taken to recovery in stable condition having tolerated his operation well.   PLAN OF CARE: Admit to inpatient   PATIENT DISPOSITION:  PACU - hemodynamically stable.   Delay start of Pharmacological VTE agent (>24hrs) due to surgical blood loss or risk of bleeding: yes

## 2016-08-27 NOTE — Progress Notes (Signed)
Patient ID: Joel LeachRalph L Mcduffey, male   DOB: [birth year], 65 y.o.   MRN: [id number] Alert, conversant, smiling. Family present. Verbalizes understanding of plan for surgery today and wishes to proceed.   Georgiann CockerBrian Darnetta Kesselman RN BSN

## 2016-08-27 NOTE — Anesthesia Procedure Notes (Signed)
Procedure Name: Intubation Date/Time: 08/27/2016 12:12 PM Performed by: Rogelia BogaMUELLER, Sahas Sluka P Pre-anesthesia Checklist: Patient identified, Emergency Drugs available, Suction available, Patient being monitored and Timeout performed Patient Re-evaluated:Patient Re-evaluated prior to inductionOxygen Delivery Method: Circle system utilized Preoxygenation: Pre-oxygenation with 100% oxygen Intubation Type: IV induction Ventilation: Mask ventilation without difficulty and Oral airway inserted - appropriate to patient size Laryngoscope Size: Mac and 4 Grade View: Grade II Tube type: Oral Tube size: 8.0 mm Number of attempts: 1 Airway Equipment and Method: Stylet Secured at: 22 cm Tube secured with: Tape Dental Injury: Teeth and Oropharynx as per pre-operative assessment

## 2016-08-27 NOTE — Anesthesia Preprocedure Evaluation (Addendum)
Anesthesia Evaluation  Patient identified by MRN, date of birth, ID band  Reviewed: Allergy & Precautions, NPO status , Patient's Chart, lab work & pertinent test results  Airway Mallampati: II  TM Distance: >3 FB Neck ROM: Full    Dental  (+) Teeth Intact, Dental Advisory Given   Pulmonary sleep apnea and Continuous Positive Airway Pressure Ventilation ,    breath sounds clear to auscultation       Cardiovascular hypertension, + Past MI and + Peripheral Vascular Disease   Rhythm:Irregular Rate:Normal + Systolic murmurs- Diastolic murmurs    Neuro/Psych    GI/Hepatic   Endo/Other  diabetesMorbid obesity  Renal/GU      Musculoskeletal   Abdominal   Peds  Hematology   Anesthesia Other Findings   Reproductive/Obstetrics                            Anesthesia Physical Anesthesia Plan  ASA: III  Anesthesia Plan: General   Post-op Pain Management:    Induction: Intravenous  Airway Management Planned: Oral ETT  Additional Equipment: Arterial line  Intra-op Plan:   Post-operative Plan: Extubation in OR and Possible Post-op intubation/ventilation  Informed Consent: I have reviewed the patients History and Physical, chart, labs and discussed the procedure including the risks, benefits and alternatives for the proposed anesthesia with the patient or authorized representative who has indicated his/her understanding and acceptance.   Dental advisory given  Plan Discussed with: CRNA, Anesthesiologist and Surgeon  Anesthesia Plan Comments:        Anesthesia Quick Evaluation

## 2016-08-27 NOTE — Transfer of Care (Signed)
Immediate Anesthesia Transfer of Care Note  Patient: Joel Rogers  Procedure(s) Performed: Procedure(s): CRANIOTOMY HEMATOMA EVACUATION SUBDURAL (Right)  Patient Location: PACU  Anesthesia Type:General  Level of Consciousness: awake, alert , oriented and patient cooperative  Airway & Oxygen Therapy: Patient Spontanous Breathing and Patient connected to face mask oxygen  Post-op Assessment: Report given to RN, Patient moving all extremities X 4 and Patient able to stick tongue midline  Post vital signs: Reviewed and stable  Last Vitals:  Vitals:   08/27/16 0900 08/27/16 1000  BP: (!) 144/85 138/83  Pulse: (!) 55 62  Resp: 19 18  Temp:      Last Pain:  Vitals:   08/27/16 0800  TempSrc: Oral         Complications: No apparent anesthesia complications

## 2016-08-27 NOTE — H&P (Signed)
Reason for Consult:SDH Right Referring Physician: Luverne Rogers is an 65 y.o. male.  HPI: Joel Rogers is a 65 y.o. male with PMHx of HTN and HLD who presented to the Emergency Department at [facility] complaining of sudden-onset constant unchanged lower left extremity weakness beginning 2 days ago. Pt states that he was out at a football game all night before the onset of his symptoms, and woke up experiencing the weakness. Pt endorses an associated headache that he states started around the same time as the weakness. He is compliant with his blood thinner medication, Eliquis. He and his wife recall that he hit his head on a dresser and had bruising on the right side of his scalp about a month ago.   Past Medical History:  Diagnosis Date  . Atrial fibrillation ([place])   . Hyperlipidemia   . Hypertension   . Normal coronary arteries    by cardiac catheterization performed 03/14/06  . Obesity   . Sleep apnea    on CPAP  . Venous insufficiency     Past Surgical History:  Procedure Laterality Date  . GASTRIC BYPASS  09/2008  . TOTAL HIP ARTHROPLASTY     right hip    Family History  Problem Relation Age of Onset  . Hypertension Father     Social History:  reports that he has never smoked. He has never used smokeless tobacco. He reports that he does not drink alcohol or use drugs.  Allergies: No Known Allergies  Medications: I have reviewed the patient's current medications.  Results for orders placed or performed during the hospital encounter of 08/26/16 (from the past 48 hour(s))  CBC with Differential/Platelet     Status: None   Collection Time: 08/26/16  9:15 PM  Result Value Ref Range   WBC 7.7 4.0 - 10.5 K/uL   RBC 4.97 4.22 - 5.81 MIL/uL   Hemoglobin 13.8 13.0 - 17.0 g/dL   HCT 40.5 39.0 - 52.0 %   MCV 81.5 78.0 - 100.0 fL   MCH 27.8 26.0 - 34.0 pg   MCHC 34.1 30.0 - 36.0 g/dL   RDW 14.7 11.5 - 15.5 %   Platelets 179 150 - 400 K/uL   Neutrophils Relative % 68  %   Neutro Abs 5.3 1.7 - 7.7 K/uL   Lymphocytes Relative 20 %   Lymphs Abs 1.5 0.7 - 4.0 K/uL   Monocytes Relative 10 %   Monocytes Absolute 0.8 0.1 - 1.0 K/uL   Eosinophils Relative 1 %   Eosinophils Absolute 0.0 0.0 - 0.7 K/uL   Basophils Relative 1 %   Basophils Absolute 0.0 0.0 - 0.1 K/uL  Comprehensive metabolic panel     Status: Abnormal   Collection Time: 08/26/16  9:15 PM  Result Value Ref Range   Sodium 140 135 - 145 mmol/L   Potassium 3.6 3.5 - 5.1 mmol/L   Chloride 107 101 - 111 mmol/L   CO2 27 22 - 32 mmol/L   Glucose, Bld 114 (H) 65 - 99 mg/dL   BUN 13 6 - 20 mg/dL   Creatinine, Ser 0.94 0.61 - 1.24 mg/dL   Calcium 8.9 8.9 - 10.3 mg/dL   Total Protein 6.8 6.5 - 8.1 g/dL   Albumin 3.8 3.5 - 5.0 g/dL   AST 27 15 - 41 U/L   ALT 19 17 - 63 U/L   Alkaline Phosphatase 71 38 - 126 U/L   Total Bilirubin 1.1 0.3 - 1.2 mg/dL  GFR calc non Af Amer >60 >60 mL/min   GFR calc Af Amer >60 >60 mL/min    Comment: (NOTE) The eGFR has been calculated using the CKD EPI equation. This calculation has not been validated in all clinical situations. eGFR's persistently <60 mL/min signify possible Chronic Kidney Disease.    Anion gap 6 5 - 15  Protime-INR     Status: None   Collection Time: 08/26/16  9:15 PM  Result Value Ref Range   Prothrombin Time 14.6 11.4 - 15.2 seconds   INR 1.13   Heparin level (unfractionated)     Status: None   Collection Time: 08/26/16  9:15 PM  Result Value Ref Range   Heparin Unfractionated 0.36 0.30 - 0.70 IU/mL    Comment:        IF HEPARIN RESULTS ARE BELOW EXPECTED VALUES, AND PATIENT DOSAGE HAS BEEN CONFIRMED, SUGGEST FOLLOW UP TESTING OF ANTITHROMBIN III LEVELS.   APTT     Status: None   Collection Time: 08/26/16  9:15 PM  Result Value Ref Range   aPTT 34 24 - 36 seconds    Ct Head Wo Contrast  Result Date: 08/26/2016 CLINICAL DATA:  Sudden onset constant left lower extremity weakness beginning 2 days ago. Associated headache. EXAM:  CT HEAD WITHOUT CONTRAST TECHNIQUE: Contiguous axial images were obtained from the base of the skull through the vertex without intravenous contrast. COMPARISON:  07/11/2016 CT FINDINGS: Brain: New since prior exam is a large right-sided subdural hematoma measuring up to 2 cm in thickness over the convexity of the right cerebral hemisphere. There is 1.3 cm of right to left subfalcine shift. No downward herniation noted. Shift of the right lateral ventricle to the left of midline with new dilatation of the left lateral ventricle and temporal horn of the ventricle right raise the possibility of early trapped ventricle. No acute infarction noted. No focal mass lesion is seen. No definite subarachnoid or intraventricular hemorrhage. Fourth ventricle is midline. Vascular: No hyperdense vessel. Cavernous sinus carotid calcifications bilaterally. Skull: No acute osseous abnormality Sinuses/Orbits: No acute finding. Other: None IMPRESSION: New 2 cm in thickness right subacute subdural hematoma with her for 1.3 cm of right to left midline shift. Possible early entrapment of the left lateral ventricle due to new dilatation from the shift. These results were called by telephone at the time of interpretation on 08/26/2016 at 10:06 pm to Dr. Bethann Berkshire , who verbally acknowledged these results. Electronically Signed   By: Tollie Eth M.D.   On: 08/26/2016 22:07    Review of Systems - Negative except A. Fib, Obesity, s/p gastric bypass, works as a Optician, dispensing    Blood pressure (!) 158/93, pulse 74, temperature 98.5 F (36.9 C), temperature source Oral, resp. rate (!) 23, height 6\' 1"  (1.854 m), weight (!) 141.1 kg (311 lb), SpO2 99 %. Physical Exam  Constitutional: He is oriented to person, place, and time. He appears well-developed and well-nourished.  HENT:  Head: Normocephalic and atraumatic.  Eyes: EOM are normal. Pupils are equal, round, and reactive to light.  Neck: Normal range of motion. Neck supple.   Cardiovascular: Normal rate.  An irregular rhythm present.  Murmur heard. Respiratory: Effort normal and breath sounds normal.  GI: Soft. Normal appearance and bowel sounds are normal.  Neurological: He is alert and oriented to person, place, and time. No cranial nerve deficit or sensory deficit. GCS eye subscore is 4. GCS verbal subscore is 5. GCS motor subscore is 6.  Weak left  leg, barely antigravity, weak in left hand intrinsics, mild left pronator drift  Psychiatric: He has a normal mood and affect. His speech is normal and behavior is normal. Judgment and thought content normal. Cognition and memory are normal.    Assessment/Plan: Patient is anticoagulaed on Eliquis and has large right SDH.  He will need correction of anti-coagulation and right craniotomy for SDH in the near future after Eliquis is out of his system.   Peggyann Shoals, MD 08/27/2016, 1:41 AM

## 2016-08-28 ENCOUNTER — Inpatient Hospital Stay (HOSPITAL_COMMUNITY): Payer: Commercial Managed Care - HMO

## 2016-08-28 MED ORDER — CYCLOBENZAPRINE HCL 10 MG PO TABS
10.0000 mg | ORAL_TABLET | Freq: Three times a day (TID) | ORAL | Status: DC | PRN
  Administered 2016-08-28 – 2016-08-30 (×4): 10 mg via ORAL
  Filled 2016-08-28 (×4): qty 1

## 2016-08-28 MED ORDER — PNEUMOCOCCAL VAC POLYVALENT 25 MCG/0.5ML IJ INJ
0.5000 mL | INJECTION | INTRAMUSCULAR | Status: AC
  Administered 2016-08-29: 0.5 mL via INTRAMUSCULAR
  Filled 2016-08-28: qty 0.5

## 2016-08-28 MED ORDER — INFLUENZA VAC SPLIT QUAD 0.5 ML IM SUSY
0.5000 mL | PREFILLED_SYRINGE | INTRAMUSCULAR | Status: AC
  Administered 2016-08-29: 0.5 mL via INTRAMUSCULAR
  Filled 2016-08-28: qty 0.5

## 2016-08-28 NOTE — Progress Notes (Signed)
Patient ID: Joel Rogers, male   DOB: Oct 30, 1951, 65 y.o.   MRN: [id number] Subjective: Patient reports he is doing well. Minimal headache. No numbness tingling or weakness.  Objective: Vital signs in last 24 hours: Temp:  [98 F (36.7 C)-98.8 F (37.1 C)] 98.8 F (37.1 C) (10/07 0400) Pulse Rate:  [35-89] 70 (10/07 0700) Resp:  [0-20] 16 (10/07 0700) BP: (103-175)/(64-116) 175/116 (10/07 0700) SpO2:  [94 %-100 %] 97 % (10/07 0700) Arterial Line BP: (125-215)/(65-111) 215/111 (10/07 0700)  Intake/Output from previous day: 10/06 0701 - 10/07 0700 In: 3982.5 [P.O.:480; I.V.:3247.5; IV Piggyback:200] Out: 480 [Urine:225; Drains:155; Blood:100] Intake/Output this shift: No intake/output data recorded.  Neurologic: Grossly normal, awake and alert and following commands and moving all extremities, dressing okay  Lab Results: Lab Results  Component Value Date   WBC 7.7 08/26/2016   HGB 13.8 08/26/2016   HCT 40.5 08/26/2016   MCV 81.5 08/26/2016   PLT 179 08/26/2016   Lab Results  Component Value Date   INR 1.07 08/27/2016   BMET Lab Results  Component Value Date   NA 140 08/26/2016   K 3.6 08/26/2016   CL 107 08/26/2016   CO2 27 08/26/2016   GLUCOSE 114 (H) 08/26/2016   BUN 13 08/26/2016   CREATININE 0.94 08/26/2016   CALCIUM 8.9 08/26/2016    Studies/Results: Ct Head Wo Contrast  Result Date: 08/28/2016 CLINICAL DATA:  Status post craniotomy for subdural hematoma. Soreness at incision site. EXAM: CT HEAD WITHOUT CONTRAST TECHNIQUE: Contiguous axial images were obtained from the base of the skull through the vertex without intravenous contrast. COMPARISON:  Head CT 08/26/2016. FINDINGS: Brain: The patient has undergone bone scrotal greater right frontal subdural drainage catheter. Thickness of right convexity hematoma has decreased, now measuring 12 mm, previously 17 mm. There is multifocal pneumocephalus along the right convexity. Midline shift has decreased, now  measuring 7 mm, previously 14 mm. No hydrocephalus. There is an area of subarachnoid hemorrhage along the superior left frontal lobe. Vascular: No hyperdense vessel or unexpected calcification. Skull: Interval burr hole or subdural catheter placement. Sinuses/Orbits: No acute finding. Other: None. IMPRESSION: 1. Placement of right frontal subdural drainage catheter with associated decrease in the thickness of the right convexity subdural hematoma and decrease in the degree of leftward midline shift. 2. New area of subarachnoid hemorrhage along the superior left frontal sulci. Electronically Signed   By: Deatra Robinson M.D.   On: 08/28/2016 06:45   Ct Head Wo Contrast  Result Date: 08/26/2016 CLINICAL DATA:  Sudden onset constant left lower extremity weakness beginning 2 days ago. Associated headache. EXAM: CT HEAD WITHOUT CONTRAST TECHNIQUE: Contiguous axial images were obtained from the base of the skull through the vertex without intravenous contrast. COMPARISON:  07/11/2016 CT FINDINGS: Brain: New since prior exam is a large right-sided subdural hematoma measuring up to 2 cm in thickness over the convexity of the right cerebral hemisphere. There is 1.3 cm of right to left subfalcine shift. No downward herniation noted. Shift of the right lateral ventricle to the left of midline with new dilatation of the left lateral ventricle and temporal horn of the ventricle right raise the possibility of early trapped ventricle. No acute infarction noted. No focal mass lesion is seen. No definite subarachnoid or intraventricular hemorrhage. Fourth ventricle is midline. Vascular: No hyperdense vessel. Cavernous sinus carotid calcifications bilaterally. Skull: No acute osseous abnormality Sinuses/Orbits: No acute finding. Other: None IMPRESSION: New 2 cm in thickness right subacute subdural hematoma with her  for 1.3 cm of right to left midline shift. Possible early entrapment of the left lateral ventricle due to new  dilatation from the shift. These results were called by telephone at the time of interpretation on 08/26/2016 at 10:06 pm to Dr. Bethann BerkshireJOSEPH ZAMMIT , who verbally acknowledged these results. Electronically Signed   By: Tollie Ethavid  Kwon M.D.   On: 08/26/2016 22:07    Assessment/Plan: Doing very well postop day 1. Pleased with CT scan. Mobilize today.   LOS: 2 days    Aymar Whitfill S 08/28/2016, 7:39 AM

## 2016-08-29 MED ORDER — FAMOTIDINE 20 MG PO TABS
20.0000 mg | ORAL_TABLET | Freq: Two times a day (BID) | ORAL | Status: DC
  Administered 2016-08-29 – 2016-08-31 (×4): 20 mg via ORAL
  Filled 2016-08-29 (×4): qty 1

## 2016-08-29 NOTE — Evaluation (Signed)
Physical Therapy Evaluation Patient Details Name: Joel LeachRalph L Rogers MRN: [id number] DOB: [birth year] Today's Date: 08/29/2016   History of Present Illness  Patient is a 65 y/o male with PMH of A-fib, HTN, HLD, obesity, sleep apnea and venous insufficiency admitted with subdural  bleed with hemiparesis and anti-coagulation s/p CRANIOTOMY HEMATOMA EVACUATION SUBDURAL (Right)  Clinical Impression  Patient presents with decreased independence with mobility due to deficits listed in PT problem list.  He will benefit from skilled PT in the acute setting to allow return home with family support and follow up outpatient PT.  Lives in [place], [facility].     Follow Up Recommendations Outpatient PT;Supervision - Intermittent    Equipment Recommendations  Rolling walker with 5" wheels (bariatric rollator)    Recommendations for Other Services       Precautions / Restrictions Precautions Precautions: Fall      Mobility  Bed Mobility               General bed mobility comments: up in chair  Transfers Overall transfer level: Needs assistance Equipment used: 4-wheeled walker Transfers: Sit to/from Stand Sit to Stand: Min assist         General transfer comment: cues for hand placement  Ambulation/Gait Ambulation/Gait assistance: Min assist Ambulation Distance (Feet): 100 Feet Assistive device: 4-wheeled walker Gait Pattern/deviations: Step-through pattern;Trunk flexed;Wide base of support;Decreased stride length     General Gait Details: assist for safety with balance, turning walker, pt noted still some weakness on L, but much improved  Stairs            Wheelchair Mobility    Modified Rankin (Stroke Patients Only)       Balance Overall balance assessment: Needs assistance   Sitting balance-Leahy Scale: Good       Standing balance-Leahy Scale: Fair                               Pertinent Vitals/Pain Pain Assessment:  Faces Faces Pain Scale: Hurts a little bit Pain Location: site of crani Pain Descriptors / Indicators: Discomfort Pain Intervention(s): Monitored during session    Home Living Family/patient expects to be discharged to:: Private residence Living Arrangements: Spouse/significant other Available Help at Discharge: Family;Available 24 hours/day Type of Home: House Home Access: Stairs to enter Entrance Stairs-Rails: None (has a wall) Entrance Stairs-Number of Steps: 2 Home Layout: One level Home Equipment: None      Prior Function Level of Independence: Independent               Hand Dominance   Dominant Hand: Right    Extremity/Trunk Assessment   Upper Extremity Assessment: Defer to OT evaluation           Lower Extremity Assessment: RLE deficits/detail;LLE deficits/detail RLE Deficits / Details: AROM WFL, strength hip flexion 4-/5, otherwise WFL LLE Deficits / Details: AROM WFL, strength hip flexion 4-/5, knee extension 4/5 ankle DF 4/5     Communication   Communication: No difficulties  Cognition Arousal/Alertness: Awake/alert Behavior During Therapy: WFL for tasks assessed/performed Overall Cognitive Status: Within Functional Limits for tasks assessed                      General Comments      Exercises     Assessment/Plan    PT Assessment Patient needs continued PT services  PT Problem List Decreased strength;Decreased mobility;Decreased balance;Decreased activity tolerance;Decreased knowledge of  use of DME          PT Treatment Interventions DME instruction;Gait training;Therapeutic activities;Therapeutic exercise;Patient/family education;Balance training;Functional mobility training;Stair training    PT Goals (Current goals can be found in the Care Plan section)  Acute Rehab PT Goals Patient Stated Goal: To return to independent PT Goal Formulation: With patient/family Time For Goal Achievement: 09/03/16 Potential to Achieve Goals:  Good    Frequency Min 3X/week   Barriers to discharge        Co-evaluation               End of Session Equipment Utilized During Treatment: Gait belt Activity Tolerance: Patient tolerated treatment well Patient left: in chair;with call bell/phone within reach;with family/visitor present Nurse Communication: Mobility status         Time: 1610-9604 PT Time Calculation (min) (ACUTE ONLY): 31 min   Charges:   PT Evaluation $PT Eval Moderate Complexity: 1 Procedure PT Treatments $Gait Training: 8-22 mins   PT G CodesElray Mcgregor 09-11-2016, 4:09 PM  Sheran Lawless, PT [phone] 11-Sep-2016

## 2016-08-29 NOTE — Progress Notes (Signed)
Patient ID: Joel LeachRalph L Rogers, male   DOB: [birth year], 65 y.o.   MRN: [id number] Subjective: Patient reports mild headache. No numbness tingling or weakness.  Objective: Vital signs in last 24 hours: Temp:  [97.5 F (36.4 C)-99 F (37.2 C)] 98 F (36.7 C) (10/08 0730) Pulse Rate:  [53-78] 59 (10/08 0900) Resp:  [12-21] 15 (10/08 0900) BP: (113-179)/(72-132) 154/90 (10/08 0900) SpO2:  [94 %-100 %] 97 % (10/08 0900)  Intake/Output from previous day: 10/07 0701 - 10/08 0700 In: 550 [I.V.:240; IV Piggyback:310] Out: 1850 [Urine:1400; Drains:450] Intake/Output this shift: Total I/O In: 500 [P.O.:480; I.V.:20] Out: 495 [Urine:425; Drains:70]  Neurologic: Grossly normal, out of bed to chair eating breakfast. Moving all extremities equally. Awake and alert and conversant.  Lab Results: Lab Results  Component Value Date   WBC 7.7 08/26/2016   HGB 13.8 08/26/2016   HCT 40.5 08/26/2016   MCV 81.5 08/26/2016   PLT 179 08/26/2016   Lab Results  Component Value Date   INR 1.07 08/27/2016   BMET Lab Results  Component Value Date   NA 140 08/26/2016   K 3.6 08/26/2016   CL 107 08/26/2016   CO2 27 08/26/2016   GLUCOSE 114 (H) 08/26/2016   BUN 13 08/26/2016   CREATININE 0.94 08/26/2016   CALCIUM 8.9 08/26/2016    Studies/Results: Ct Head Wo Contrast  Result Date: 08/28/2016 CLINICAL DATA:  Status post craniotomy for subdural hematoma. Soreness at incision site. EXAM: CT HEAD WITHOUT CONTRAST TECHNIQUE: Contiguous axial images were obtained from the base of the skull through the vertex without intravenous contrast. COMPARISON:  Head CT 08/26/2016. FINDINGS: Brain: The patient has undergone bone scrotal greater right frontal subdural drainage catheter. Thickness of right convexity hematoma has decreased, now measuring 12 mm, previously 17 mm. There is multifocal pneumocephalus along the right convexity. Midline shift has decreased, now measuring 7 mm, previously 14 mm. No  hydrocephalus. There is an area of subarachnoid hemorrhage along the superior left frontal lobe. Vascular: No hyperdense vessel or unexpected calcification. Skull: Interval burr hole or subdural catheter placement. Sinuses/Orbits: No acute finding. Other: None. IMPRESSION: 1. Placement of right frontal subdural drainage catheter with associated decrease in the thickness of the right convexity subdural hematoma and decrease in the degree of leftward midline shift. 2. New area of subarachnoid hemorrhage along the superior left frontal sulci. Electronically Signed   By: Deatra RobinsonKevin  Herman M.D.   On: 08/28/2016 06:45    Assessment/Plan: Seems to be doing well. For transfer to floor today. Drain removed today. Mobilize.   LOS: 3 days    Kerianne Gurr S 08/29/2016, 9:31 AM

## 2016-08-29 NOTE — Anesthesia Postprocedure Evaluation (Signed)
Anesthesia Post Note  Patient: Claudie LeachRalph L Mestre  Procedure(s) Performed: Procedure(s) (LRB): CRANIOTOMY HEMATOMA EVACUATION SUBDURAL (Right)  Patient location during evaluation: PACU Anesthesia Type: General Level of consciousness: sedated and patient cooperative Pain management: pain level controlled Vital Signs Assessment: post-procedure vital signs reviewed and stable Respiratory status: spontaneous breathing Cardiovascular status: stable Anesthetic complications: no    Last Vitals:  Vitals:   08/29/16 1717 08/29/16 2135  BP: 135/90 (!) 148/83  Pulse: 81 85  Resp: 20 20  Temp: 36.3 C 37 C    Last Pain:  Vitals:   08/29/16 2135  TempSrc: Oral  PainSc:                  Lewie LoronJohn Joseff Luckman

## 2016-08-30 MED ORDER — LEVETIRACETAM 500 MG PO TABS
500.0000 mg | ORAL_TABLET | Freq: Two times a day (BID) | ORAL | Status: DC
  Administered 2016-08-30 – 2016-08-31 (×2): 500 mg via ORAL
  Filled 2016-08-30 (×3): qty 1

## 2016-08-30 NOTE — Care Management Important Message (Signed)
Important Message  Patient Details  Name: Joel Rogers MRN: [id number] Date of Birth: 1950-12-05   Medicare Important Message Given:  Yes    Joel Rogers 08/30/2016, 11:47 AM

## 2016-08-30 NOTE — Evaluation (Signed)
Occupational Therapy Evaluation Patient Details Name: Joel LeachRalph L Gillen MRN: [id number] DOB: [birth year] Today's Date: 08/30/2016    History of Present Illness 65 yo male admitted with SDH with hemiparesis and anti-coagulation s/p craniotomy hematoma evacuation subdural PMH afib htn HLD obesity sleep apnea and venous insufficiency    Clinical Impression   Patient is s/p craniotomy hematoma evacuation subdural  surgery resulting in functional limitations due to the deficits listed below (see OT problem list). PTA was independent. Patient will benefit from skilled OT acutely to increase independence and safety with ADLS to allow discharge outpatient. Pt reports blurred vision a few times during the day but resolves. Pt no blurred vision during session. Pt does report HA after mobility and adl at sink level. Pt reports HA to be minimal and resolves with medication. OT to continue to follow acutely.       Follow Up Recommendations  Outpatient OT    Equipment Recommendations  Other (comment) (RW)    Recommendations for Other Services       Precautions / Restrictions Precautions Precautions: Fall      Mobility Bed Mobility Overal bed mobility: Needs Assistance Bed Mobility: Rolling;Supine to Sit Rolling: Min assist   Supine to sit: Min assist;HOB elevated     General bed mobility comments: pt requires cues to sequence OOB with R UE reaching toward L side of bed. pt with HOB elevated and requires Min (A) to pull on therapist to exit bed. pt requried incr time to complete task  Transfers Overall transfer level: Needs assistance Equipment used: Rolling walker (2 wheeled) Transfers: Sit to/from Stand Sit to Stand: Min assist         General transfer comment: cues for hand placement and (A) to power up into standing    Balance Overall balance assessment: Needs assistance Sitting-balance support: Bilateral upper extremity supported;Feet supported Sitting balance-Leahy  Scale: Good       Standing balance-Leahy Scale: Fair                              ADL Overall ADL's : Needs assistance/impaired Eating/Feeding: Independent   Grooming: Wash/dry face;Min guard;Standing Grooming Details (indicate cue type and reason): required v/c for upright posture due to leaning on sink surface. pt unable to sustain static standing without UE support Upper Body Bathing: Minimal assitance   Lower Body Bathing: Maximal assistance           Toilet Transfer: Minimal assistance;RW Toilet Transfer Details (indicate cue type and reason): requires (A) to power up into standing         Functional mobility during ADLs: Min guard General ADL Comments: pt requires (A) for OOB with HOB elevated. pt required (A) to sequence. Pt c/o minimal headache pain and reports pain is being managed well currently     Vision Additional Comments: reports blurred vision a few times during the day but resolves   Perception     Praxis      Pertinent Vitals/Pain Pain Assessment: Faces Faces Pain Scale: Hurts a little bit Pain Location: site of craniotomy Pain Descriptors / Indicators: Operative site guarding Pain Intervention(s): Limited activity within patient's tolerance;Monitored during session;Premedicated before session;Repositioned     Hand Dominance Right   Extremity/Trunk Assessment Upper Extremity Assessment Upper Extremity Assessment: Overall WFL for tasks assessed   Lower Extremity Assessment Lower Extremity Assessment: Defer to PT evaluation   Cervical / Trunk Assessment Cervical / Trunk Assessment: Kyphotic  Communication Communication Communication: No difficulties   Cognition Arousal/Alertness: Awake/alert Behavior During Therapy: WFL for tasks assessed/performed Overall Cognitive Status: Within Functional Limits for tasks assessed                     General Comments       Exercises       Shoulder Instructions      Home  Living Family/patient expects to be discharged to:: Private residence Living Arrangements: Spouse/significant other Available Help at Discharge: Family;Available 24 hours/day Type of Home: House Home Access: Stairs to enter [employer] of Steps: 2 Entrance Stairs-Rails: None Home Layout: One level     Bathroom Shower/Tub: Runner, broadcasting/film/video: None          Prior Functioning/Environment Level of Independence: Independent        Comments: [employer]        OT Problem List: Decreased strength;Decreased activity tolerance;Impaired balance (sitting and/or standing);Decreased safety awareness;Decreased knowledge of use of DME or AE;Decreased knowledge of precautions;Pain;Obesity   OT Treatment/Interventions: Self-care/ADL training;Therapeutic exercise;DME and/or AE instruction;Therapeutic activities;Patient/family education;Balance training    OT Goals(Current goals can be found in the care plan section) Acute Rehab OT Goals Patient Stated Goal: To return to independent OT Goal Formulation: With patient Time For Goal Achievement: 09/13/16 Potential to Achieve Goals: Good  OT Frequency: Min 2X/week   Barriers to D/C:            Co-evaluation              End of Session Equipment Utilized During Treatment: Gait belt;Rolling walker Nurse Communication: Mobility status;Precautions  Activity Tolerance: Patient tolerated treatment well Patient left: in chair;with call bell/phone within reach;with chair alarm set   Time: 4540-9811 OT Time Calculation (min): 16 min Charges:  OT General Charges $OT Visit: 1 Procedure OT Evaluation $OT Eval Moderate Complexity: 1 Procedure G-Codes:    Boone Master B Sep 18, 2016, 8:17 AM   Mateo Flow   OTR/L Pager: [phone] Office: [phone] .

## 2016-08-30 NOTE — Progress Notes (Addendum)
Subjective: Patient reports "I didn't drag my foot yesterday like I was"  Objective: Vital signs in last 24 hours: Temp:  [97.4 F (36.3 C)-99 F (37.2 C)] 98.8 F (37.1 C) (10/09 0532) Pulse Rate:  [58-85] 58 (10/09 0532) Resp:  [15-21] 20 (10/09 0532) BP: (123-154)/(78-93) 123/78 (10/09 0532) SpO2:  [95 %-100 %] 99 % (10/09 0532)  Intake/Output from previous day: 10/08 0701 - 10/09 0700 In: 1070 [P.O.:980; I.V.:40; IV Piggyback:50] Out: 1295 [Urine:1225; Drains:70] Intake/Output this shift: No intake/output data recorded.  Working with OT, ambulated to sink and chair using walker. Improved strength LLE & LUE, states "my leg still feels funny, but I don't drag my foot anymore". PEARL. No drift. Incisions without erythema, swelling, or drainage. Staples intact, well-approximated skin edges.   Lab Results: No results for input(s): WBC, HGB, HCT, PLT in the last 72 hours. BMET No results for input(s): NA, K, CL, CO2, GLUCOSE, BUN, CREATININE, CALCIUM in the last 72 hours.  Studies/Results: No results found.  Assessment/Plan: improving  LOS: 4 days  Continue to mobilize.    Georgiann Cockeroteat, Brian 08/30/2016, 8:10 AM    Patient making good progress.  Mobilize with PT.  Discharge when cleared by PT.

## 2016-08-30 NOTE — Consult Note (Signed)
[facility] CM Primary Care Navigator  08/30/2016  Joel Rogers 1951-02-09 [id number]  Met with patient at the bedside to identify possible discharge needs.  Patient mentioned that he was going for his scheduled sleep study when he had felt his left leg was getting numb and heavy which led to this admission/ surgery. Patient endorses Dr. Redmond School from [facility]) as the primary care provider.    Patient shared using [facility] for his regular medications and uses [facility] in [place] to obtain Eliquis. He mentioned being in the "donut hole" which causes him some challenge to afford Eliquis. Patient agreed to notify Inpatient CM regarding this issue to get assistance in obtaining/ affording this medication.   Patient states being able to manage his own medications at home. Both him and his wife take their medications at the same time to remind each other in taking their medicines as stated.   He mentioned being able to drive prior to this admission/ surgery. Patient states that wife [name] will provide transportation to his doctors' appointments and she will be the primary caregiver at home.   Patient voiced understanding to call primary care provider's office for a post discharge follow-up appointment within a week or sooner if needs arise. Patient letter provided to remind him.  He denies further needs or concerns at this time.   For additional questions please contact:  Edwena Felty A. Iysha Mishkin, BSN, RN-BC [facility] PRIMARY CARE Navigator Cell: [phone]

## 2016-08-30 NOTE — Progress Notes (Signed)
Physical Therapy Treatment Patient Details Name: GRAYSIN LUCZYNSKI MRN: [id number] DOB: [birth year] Today's Date: 08/30/2016    History of Present Illness 65 yo male admitted with SDH with hemiparesis and anti-coagulation s/p craniotomy hematoma evacuation subdural PMH afib htn HLD obesity sleep apnea and venous insufficiency     PT Comments    Gait much improved with little unsteadiness noted.  L LE still weak and pt in need of the RW  Follow Up Recommendations  Outpatient PT;Supervision - Intermittent     Equipment Recommendations  Rolling walker with 5" wheels;Other (comment) (needs a "Light-weight" 300# bariatric RW  more for the width)    Recommendations for Other Services       Precautions / Restrictions Precautions Precautions: Fall    Mobility  Bed Mobility               General bed mobility comments: up in the recliner  Transfers Overall transfer level: Needs assistance Equipment used: Rolling walker (2 wheeled) Transfers: Sit to/from Stand Sit to Stand: Min guard         General transfer comment: cues for hand placement, no assist or stability to stand  Ambulation/Gait Ambulation/Gait assistance: Min guard Ambulation Distance (Feet): 200 Feet Assistive device: 4-wheeled walker Gait Pattern/deviations: Step-through pattern Gait velocity: slow Gait velocity interpretation: Below normal speed for age/gender General Gait Details: pt's gait steady with mild waddle and barely discernable difference in L LE swing through.   Stairs            Wheelchair Mobility    Modified Rankin (Stroke Patients Only) Modified Rankin (Stroke Patients Only) Pre-Morbid Rankin Score: No symptoms Modified Rankin: Moderate disability     Balance Overall balance assessment: Needs assistance Sitting-balance support: No upper extremity supported Sitting balance-Leahy Scale: Good       Standing balance-Leahy Scale: Fair                       Cognition Arousal/Alertness: Awake/alert Behavior During Therapy: WFL for tasks assessed/performed Overall Cognitive Status: Within Functional Limits for tasks assessed                      Exercises      General Comments General comments (skin integrity, edema, etc.): pt states he wants a two wheeled RW over the rollator.      Pertinent Vitals/Pain Pain Assessment: Faces Faces Pain Scale: No hurt    Home Living                      Prior Function            PT Goals (current goals can now be found in the care plan section) Acute Rehab PT Goals Patient Stated Goal: To return to independent PT Goal Formulation: With patient Time For Goal Achievement: 09/03/16 Potential to Achieve Goals: Good Progress towards PT goals: Progressing toward goals    Frequency    Min 3X/week      PT Plan Current plan remains appropriate    Co-evaluation             End of Session   Activity Tolerance: Patient tolerated treatment well Patient left: in chair;with call bell/phone within reach;with family/visitor present     Time: 4098-1191 PT Time Calculation (min) (ACUTE ONLY): 30 min  Charges:  $Gait Training: 8-22 mins $Therapeutic Activity: 8-22 mins  G Codes:      Tresean Mattix, Eliseo GumKenneth V 08/30/2016, 5:22 PM 08/30/2016  [place] BingKen Chanler Schreiter, PT [phone]  (pager)

## 2016-08-31 ENCOUNTER — Encounter (HOSPITAL_COMMUNITY): Payer: Self-pay | Admitting: Neurosurgery

## 2016-08-31 NOTE — Progress Notes (Addendum)
Subjective: Patient reports "I feel good! All that paralysis is gone. Only every now and then to I feel a tiny bit of headache coming on."  Objective: Vital signs in last 24 hours: Temp:  [97.8 F (36.6 C)-99.2 F (37.3 C)] 99.1 F (37.3 C) (10/10 0959) Pulse Rate:  [61-74] 74 (10/10 0959) Resp:  [16-20] 18 (10/10 0959) BP: (110-158)/(74-95) 128/87 (10/10 0959) SpO2:  [98 %-100 %] 98 % (10/10 0959)  Intake/Output from previous day: No intake/output data recorded. Intake/Output this shift: No intake/output data recorded.  Alert, conversant, sitting in chair. MAEW. PEARL. Incision well-approximated with staples intact. No drainage, swelling , or erythema. Mobilizes with rolling walker.   Lab Results: No results for input(s): WBC, HGB, HCT, PLT in the last 72 hours. BMET No results for input(s): NA, K, CL, CO2, GLUCOSE, BUN, CREATININE, CALCIUM in the last 72 hours.  Studies/Results: No results found.  Assessment/Plan: improving  LOS: 5 days  Per DrStern, d/c to home. "Light weight 300lb Bariatric rolling walker" for home use. Rx's eRx'ed including Norco 5/325 1-2 po q6hrs prn pain, Demadex 20mg  po qHS. Pt should f/u in office in 2 weeks for staple removal & have repeat head CT and office f/u in 72month.    Georgiann Cockeroteat, Brian 08/31/2016, 1:01 PM   Patient doing well.  Discharge home.

## 2016-08-31 NOTE — Discharge Summary (Addendum)
Physician Discharge Summary  Patient ID: Joel Rogers MRN: [id number] DOB/AGE: January 24, 1951 65 y.o.  Admit date: 08/26/2016 Discharge date: 08/31/2016  Admission Diagnoses:subdural bleed with hemiparesis and anti-coagulation   Discharge Diagnoses: subdural bleed with hemiparesis and anti-coagulation s/p CRANIOTOMY HEMATOMA EVACUATION SUBDURAL (Right)    Active Problems:   Subdural hematoma (HCC)   Subdural bleeding [facility](HCC)   Discharged Condition: good  Hospital Course: HPI: Joel Rogers is a 65 y.o. male with PMHx of HTN and HLD who [facility] complaining of sudden-onset constant unchanged lower left extremity weakness beginning 2 days ago. Pt states that he was out at a football game all night before the onset of his symptoms, and woke up experiencing the weakness. Pt endorses an associated headache that he states started around the same time as the weakness. After Eliquis dose stopped and appropriate time given for safe surgical intervention, right craniotomy was performed for evacuation of subdural hematoma. He recovered well and transferred to Neuro ICU and subsequently [facility] for therapies and nursing care. He has mobilized nicely.    Consults: None  Significant Diagnostic Studies: radiology: CT scan: pre- & post-op  Treatments: surgery: CRANIOTOMY HEMATOMA EVACUATION SUBDURAL (Right)     Discharge Exam: Blood pressure 128/87, pulse 74, temperature 99.1 F (37.3 C), temperature source Oral, resp. rate 18, height 6\' 1"  (1.854 m), weight (!) 141.1 kg (311 lb), SpO2 98 %. Alert, conversant, sitting in chair. MAEW. PEARL. Incision well-approximated with staples intact. No drainage, swelling , or erythema. Mobilizes with rolling walker.      Disposition: 01-Home or Self Care  "Light weight 300lb Bariatric rolling walker" for home use. Rx's eRx'ed including Norco 5/325 1-2 po q6hrs prn pain, Demadex 20mg  po qHS. Pt should f/u in office in 2 weeks for  staple removal & have repeat head CT and office f/u in 71month.         Medication List    STOP taking these medications   ALEVE 220 MG tablet Generic drug:  naproxen sodium   apixaban 5 MG Tabs tablet Commonly known as:  ELIQUIS     TAKE these medications   atorvastatin 80 MG tablet Commonly known as:  LIPITOR Take 1 tablet (80 mg total) by mouth daily with breakfast.   CALCIUM + D PO Take 1 tablet by mouth daily.   doxycycline 100 MG capsule Commonly known as:  VIBRAMYCIN Take 100 mg by mouth 2 (two) times daily. 14 day course starting on 08/22/2016   losartan 50 MG tablet Commonly known as:  COZAAR Take 50 mg by mouth daily.   metoprolol 100 MG tablet Commonly known as:  LOPRESSOR Take 1 tablet (100 mg total) by mouth 2 (two) times daily. What changed:  how much to take   torsemide 20 MG tablet Commonly known as:  DEMADEX Take 20-40 mg by mouth at bedtime.   vitamin E 400 UNIT capsule Take 400 Units by mouth daily.        Signed: Georgiann Cockeroteat, Brian 08/31/2016, 1:08 PM    Patient doing well.  Hold on restarting Eliquis.  Discharge home.

## 2016-08-31 NOTE — Consult Note (Signed)
       [facility] CM Primary Care Navigator     Claudie LeachRalph L Detlefsen [birth year] [id number]  Spoke with Inpatient case manager this morning and notified of patient's concern regarding his medication (Eliquis). She states she will be talking with patient regarding this medication.  Gayle Collard A. Mahin Guardia, BSN, RN-BC [facility] PRIMARY CARE Navigator Cell: [phone]

## 2016-08-31 NOTE — Progress Notes (Signed)
Physical Therapy Treatment Patient Details Name: Joel Rogers MRN: [id number] DOB: Sep 30, 1951 Today's Date: 08/31/2016    History of Present Illness 65 yo male admitted with SDH with hemiparesis and anti-coagulation s/p craniotomy hematoma evacuation subdural PMH afib htn HLD obesity sleep apnea and venous insufficiency     PT Comments    Patient continues to do well with PT. Tolerated stair training. Current plan remains appropriate.   Follow Up Recommendations  Outpatient PT;Supervision - Intermittent     Equipment Recommendations  Rolling walker with 5" wheels;Other (comment) (needs a "Light-weight" 300# bariatric RW  more for the width)    Recommendations for Other Services       Precautions / Restrictions Precautions Precautions: Fall Restrictions Weight Bearing Restrictions: No    Mobility  Bed Mobility               General bed mobility comments: not assessed   Transfers Overall transfer level: Needs assistance Equipment used: Rolling walker (2 wheeled) Transfers: Sit to/from Stand Sit to Stand: Supervision         General transfer comment: cues for hand placement, no assist or stability to stand  Ambulation/Gait Ambulation/Gait assistance: Supervision Ambulation Distance (Feet): 250 Feet Assistive device: Rolling walker (2 wheeled) Gait Pattern/deviations: Step-through pattern;Decreased stride length;Wide base of support Gait velocity: slow   General Gait Details: pt with steady gait; ambulated ~38ft without AD back to room; pt continues to need RW particularly for energy conservation   Stairs Stairs: Yes Stairs assistance: Min assist Stair Management: No rails;Backwards;With walker Number of Stairs: 4 (2X2) General stair comments: cues for sequencing and technique  Wheelchair Mobility    Modified Rankin (Stroke Patients Only) Modified Rankin (Stroke Patients Only) Pre-Morbid Rankin Score: No symptoms Modified Rankin: Moderate  disability     Balance     Sitting balance-Leahy Scale: Good       Standing balance-Leahy Scale: Fair                      Cognition Arousal/Alertness: Awake/alert Behavior During Therapy: WFL for tasks assessed/performed Overall Cognitive Status: Within Functional Limits for tasks assessed                      Exercises      General Comments        Pertinent Vitals/Pain Pain Assessment: No/denies pain Pain Intervention(s): Monitored during session    Home Living                      Prior Function            PT Goals (current goals can now be found in the care plan section) Acute Rehab PT Goals Patient Stated Goal: be active again Progress towards PT goals: Progressing toward goals    Frequency    Min 3X/week      PT Plan Current plan remains appropriate    Co-evaluation             End of Session Equipment Utilized During Treatment: Gait belt Activity Tolerance: Patient tolerated treatment well Patient left: in chair;with call bell/phone within reach;with chair alarm set     Time: [phone] PT Time Calculation (min) (ACUTE ONLY): 20 min  Charges:  $Gait Training: 8-22 mins                    G Codes:      Derek Mound, PTA  Pager: [phone]   08/31/2016, 9:27 AM

## 2016-09-01 NOTE — Care Management Note (Signed)
Case Management Note  Patient Details  Name: Joel Rogers MRN: [id number] Date of Birth: Jun 14, 1951  Subjective/Objective:                    Action/Plan: Pt discharged home with his wife. PT/OT recommended outpatient therapy. CM met with the patient and he stated he has transportation to outpatient therapy. Pt interested in attending in [place] area and has been to the outpatient rehab on [address]. CM spoke with Verdis Prime, RN and he agreed with outpatient therapy. Orders were placed in EPIC and information on the AVS. Pt also with orders for wide rolling walker. Jermaine with [facility] notified and delivered the equipment to the room. Pt states he has transportation home. Bedside RN was updated.  Expected Discharge Date:                  Expected Discharge Plan:  Home/Self Care  In-House Referral:     Discharge planning Services  CM Consult  Post Acute Care Choice:  Durable Medical Equipment Choice offered to:  Patient  DME Arranged:  [place] Rile wide DME Agency:  [place]:    [place]:     Status of Service:  Completed, signed off  If discussed at [place] of Stay Meetings, dates discussed:    Additional Comments:  Pollie Friar, RN 09/01/2016, 8:28 AM

## 2016-09-03 ENCOUNTER — Ambulatory Visit (HOSPITAL_COMMUNITY): Payer: Commercial Managed Care - HMO | Attending: Neurosurgery | Admitting: Specialist

## 2016-09-03 ENCOUNTER — Ambulatory Visit (HOSPITAL_COMMUNITY): Payer: Commercial Managed Care - HMO | Admitting: Physical Therapy

## 2016-09-03 DIAGNOSIS — X58XXXA Exposure to other specified factors, initial encounter: Secondary | ICD-10-CM | POA: Insufficient documentation

## 2016-09-03 DIAGNOSIS — R29898 Other symptoms and signs involving the musculoskeletal system: Secondary | ICD-10-CM | POA: Insufficient documentation

## 2016-09-03 DIAGNOSIS — R2681 Unsteadiness on feet: Secondary | ICD-10-CM | POA: Insufficient documentation

## 2016-09-03 DIAGNOSIS — T148XXA Other injury of unspecified body region, initial encounter: Secondary | ICD-10-CM | POA: Diagnosis present

## 2016-09-03 DIAGNOSIS — R278 Other lack of coordination: Secondary | ICD-10-CM | POA: Insufficient documentation

## 2016-09-03 NOTE — Patient Instructions (Signed)
Typing Left Handed Words  were  wear  are  bear  bare  care  dear    deer  stare  react  dare  sad  gear  great    rate  date  red  read  fear  free  tea    tear  fast  waste  treat  tease  zest  wax    cast  vase  vast  cat  basket  earn  casket    bee  seat  ate  gas  rare  seed  tax    fax  feed  cart  art  tart  taste  ear    here    

## 2016-09-03 NOTE — Patient Instructions (Signed)
Toe Raise (Sitting)    Raise toes, keeping heels on floor.  Now Raise your heels up  Repeat 10____ times per set. Do _1___ sets per session. Do __2__ sessions per day.  http://orth.exer.us/46   Copyright  VHI. All rights reserved.  Functional Quadriceps: Chair Squat    Keeping feet flat on floor, shoulder width apart, squat as low as is comfortable. Use support as necessary. Repeat _10___ times per set. Do 1____ sets per session. Do __2__ sessions per day.  http://orth.exer.us/736   Copyright  VHI. All rights reserved.  Marching in Place: Ball Throw Against [employer]    Marching in place,  Repeat __5-10__ times per session. Do _1___ sessions per day. Repeat on compliant surface: ________. 2-3 Copyright  VHI. All rights reserved.

## 2016-09-03 NOTE — Therapy (Signed)
[place] [facility] [address] [place], [place], [address] Phone: [phone]   Fax:  [phone]  Physical Therapy Evaluation  Patient Details  Name: Claudie LeachRalph L Picinich MRN: [id number] Date of Birth: March 09, 1951 Referring Provider: Maeola HarmanJoseph Stern   Encounter Date: 09/03/2016      PT End of Session - 09/03/16 1443    Visit Number 1   Number of Visits 8   Date for PT Re-Evaluation 10/03/16   Authorization Type [employer]   PT Start Time 1205   PT Stop Time 1240   PT Time Calculation (min) 35 min   Activity Tolerance Patient tolerated treatment well   Behavior During Therapy [facility] for tasks assessed/performed      Past Medical History:  Diagnosis Date  . Atrial fibrillation (HCC)   . Hyperlipidemia   . Hypertension   . Normal coronary arteries    by cardiac catheterization performed 03/14/06  . Obesity   . Sleep apnea    on CPAP  . Venous insufficiency     Past Surgical History:  Procedure Laterality Date  . CRANIOTOMY Right 08/27/2016   Procedure: CRANIOTOMY HEMATOMA EVACUATION SUBDURAL;  Surgeon: Maeola HarmanJoseph Stern, MD;  [facility];  Service: Neurosurgery;  Laterality: Right;  . GASTRIC BYPASS  09/2008  . TOTAL HIP ARTHROPLASTY     right hip    There were no vitals filed for this visit.       Subjective Assessment - 09/03/16 1202    Subjective Pt admitted into hospital on 08/26/2016 discharged on the 10th with diagnosis of a subdural hematoma.  [name] states that prior to his hospitalization he was not using any assistive device to ambulate with; at this time he is using a rolling walker. He states that he is using the walker due to still having occasional dizziness when he first comes from sit to stand.   He has 2 steps at his home and is going up them backward with his walker.  He has always had a lift chair due to sit to stand being somewhat difficult for him.     Pertinent History HPI: Claudie LeachRalph L Lindon is a 65 y.o. male  with PMHx of HTN and HLD who [facility] complaining of sudden-onset constant unchanged lower left extremity weakness beginning 2 days ago. Pt states that he was out at a football game all night before the onset of his symptoms, and woke up experiencing the weakness. Pt endorses an associated headache that he states started around the same time as the weakness. After Eliquis dose stopped and appropriate time given for safe surgical intervention, right craniotomy was performed for evacuation of subdural hematoma   How long can you sit comfortably? no problem   How long can you stand comfortably? no problem    How long can you walk comfortably? walks with his walker for less than 5 minutes .    Currently in Pain? Yes   Pain Score 3    Pain Location Back   Pain Orientation Right   Pain Descriptors / Indicators Aching   Pain Type Acute pain   Pain Onset In the past 7 days   Pain Frequency Occasional   Aggravating Factors  walking    Pain Relieving Factors resting    Effect of Pain on Daily Activities increases             [facility] PT Assessment - 09/03/16 1442      Assessment  Medical Diagnosis Rt subdural hematoma   Onset Date/Surgical Date 08/26/16   Next MD Visit --  sometime next week    Prior Therapy none      Precautions   Precautions None     Restrictions   Weight Bearing Restrictions No     Home Environment   Living Environment Private residence   Type of Home House   Home Access Stairs to enter   Entrance Stairs-Number of Steps 2   Home Layout One level     Prior Function   Level of Independence Independent   Vocation Part time employment   Vocation Requirements sitting    Leisure mowing grass on riding mower      Cognition   Overall Cognitive Status Within Functional Limits for tasks assessed     Functional Tests   Functional tests Single leg stance     Single Leg Stance   Comments unable to single leg stance without hand  hold      Strength   Right Hip Flexion 3+/5   Right Hip Extension 3/5   Left Hip Flexion 3+/5   Left Hip Extension 3-/5   Right Knee Extension 5/5   Left Knee Extension 5/5   Right Ankle Dorsiflexion 4/5   Left Ankle Dorsiflexion 3+/5                   [facility] Adult PT Treatment/Exercise - 09/03/16 0001      Exercises   Exercises Knee/Hip     Knee/Hip Exercises: Standing   Functional Squat 10 reps   Other Standing Knee Exercises marching x 10     Knee/Hip Exercises: Seated   Other Seated Knee/Hip Exercises ankle dorsiflexion plantar flexion x 10                 PT Education - 09/03/16 1443    Education provided Yes   Education Details HEP   Person(s) Educated Patient   Methods Explanation;Demonstration;Handout   Comprehension Verbalized understanding          PT Short Term Goals - 09/03/16 1452      PT SHORT TERM GOAL #1   Title Pt core and LE strength to be increased one grade to allow pt to come sit to stand from lower heights with ease    Time 2   Period Weeks   Status New     PT SHORT TERM GOAL #2   Title Pt to be able to tolerate walking for 40 minutes with his walker  without fatique the next day.   Time 2   Period Weeks   Status New     PT SHORT TERM GOAL #3   Title Pt to be able to single leg stance for 10 seconds bilaterallly to decrease risk of falls    Time 3   Period Weeks           PT Long Term Goals - 09/03/16 1454      PT LONG TERM GOAL #1   Title Pt to be able to single let stance for 15 seconds to allow pt  to feel comfortable walking without any assistive device.    Time 4   Period Weeks   Status New     PT LONG TERM GOAL #2   Title Pt to state that he is beginning to go back to the pool at the [facility] to improve core strength for improved functional ability.    Time 4   Period Weeks  Status New               Plan - 09-17-16 1444    Clinical Impression Statement Mr. Olivero is a 65 yo male who had a  Rt hemotoma evacuation on 08/26/2016; he was discharged to the hospital on 08/31/2016 and is now being referred to skilled physical therapy to maximize his functional level.  Previous level included ambulating without an a ssistive device and walking in the [facility] the [facility].  Examination demonstrates decreased balance, decreased strength and decreased activity  Mr. Sailors will benefit from skilled PT to return him to his maximal functional level.    Rehab Potential Good   PT Frequency 2x / week   PT Duration 4 weeks   PT Treatment/Interventions ADLs/Self Care Home Management;Gait training;Stair training;Functional mobility training;Therapeutic activities;Therapeutic exercise;Balance training;Neuromuscular re-education;Patient/family education   PT Next Visit Plan Begin narrow base of support with head turns at mat, tandem stance, functional squats, Ambulation with cane, step ups and sit to stands.       Patient will benefit from skilled therapeutic intervention in order to improve the following deficits and impairments:  Decreased activity tolerance, Decreased balance, Decreased strength, Difficulty walking, Pain  Visit Diagnosis: Weakness of left leg  Unsteadiness on feet      G-Codes - 2016-09-17 1458    Functional Assessment Tool Used clinical judgement:  walking with walker now; unable to walk as long as he would like    Functional Limitation Mobility: Walking and moving around   Mobility: Walking and Moving Around Current Status (Y7829) At least 40 percent but less than 60 percent impaired, limited or restricted   Mobility: Walking and Moving Around Goal Status [phone]) At least 20 percent but less than 40 percent impaired, limited or restricted       Problem List Patient Active Problem List   Diagnosis Date Noted  . Subdural bleeding (HCC) 08/27/2016  . Subdural hematoma (HCC) 08/26/2016  . Syncope 07/13/2016  . Weakness of right leg 10/02/2013  . Difficulty in walking(719.7)  10/02/2013  . HTN (hypertension) 04/13/2013  . Hyperlipemia 04/13/2013  . Atrial fibrillation (HCC) 02/14/2013  . Long term (current) use of anticoagulants 02/14/2013  . MYOCARDIAL INFARCTION 06/25/2008  . EXOGENOUS OBESITY 06/18/2008  . Obstructive sleep apnea 06/18/2008    Devynn Hessler,CINDY 09/17/2016, 3:01 PM  [facility] [facility] [address] [place], [name], [address] Phone: [phone]   Fax:  [phone]  Name: WESLEE FOGG MRN: [id number] Date of Birth: 03/24/1951

## 2016-09-03 NOTE — Therapy (Signed)
[place] [facility] [address] [place], [name], [address] Phone: [phone]   Fax:  [phone]  Occupational Therapy Evaluation  Patient Details  Name: Joel Rogers MRN: [id number] Date of Birth: Aug 04, 1951 Referring Provider: Dr. Maeola Harman  Encounter Date: 09/03/2016      OT End of Session - 09/03/16 1408    Visit Number 1   Number of Visits 1   Authorization Type [employer]   OT Start Time 1115   OT Stop Time 1140   OT Time Calculation (min) 25 min   Activity Tolerance Patient tolerated treatment well   Behavior During Therapy [facility] for tasks assessed/performed      Past Medical History:  Diagnosis Date  . Atrial fibrillation (HCC)   . Hyperlipidemia   . Hypertension   . Normal coronary arteries    by cardiac catheterization performed 03/14/06  . Obesity   . Sleep apnea    on CPAP  . Venous insufficiency     Past Surgical History:  Procedure Laterality Date  . CRANIOTOMY Right 08/27/2016   Procedure: CRANIOTOMY HEMATOMA EVACUATION SUBDURAL;  Surgeon: Maeola Harman, MD;  Location: [facility] OR;  Service: Neurosurgery;  Laterality: Right;  . GASTRIC BYPASS  09/2008  . TOTAL HIP ARTHROPLASTY     right hip    There were no vitals filed for this visit.      Subjective Assessment - 09/03/16 1247    Subjective  S:  I havent noticed a big difference in my arm.  I just feel woozy.   Pertinent History Joel Rogers presented to the ED for a sleep study on 10/05, patient was experiencing left leg weakness at the time.  Initial testing detected a right subdural hematoma and [facility] to remove the subdural hematoma.  He reports that he did fall and hit his head on the nightstand on 8/22, which was the cause of the hematoma.  He was hospitalized from 10/05-10/10 and was dc home with orders for outpatient OT and PT.     Patient Stated Goals I want to get back to feeling well.   Currently  in Pain? Yes   Pain Score 3    Pain Location Back   Pain Orientation Right   Pain Radiating Towards n/a   Pain Onset In the past 7 days   Pain Frequency Occasional   Aggravating Factors  walking   Pain Relieving Factors resting   Effect of Pain on Daily Activities gives out quickly           [facility] OT Assessment - 09/03/16 1252      Assessment   Diagnosis S/P Craniotomy for removal of right subdural hematoma   Referring Provider Dr. Maeola Harman   Onset Date 08/26/16   Prior Therapy acute care PT and OT     Precautions   Precautions None     Restrictions   Weight Bearing Restrictions No     Balance Screen   Has the patient fallen in the past 6 months No   Has the patient had a decrease in activity level because of a fear of falling?  No   Is the patient reluctant to leave their home because of a fear of falling?  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  has not returned to driving, but plans to do so     Mobility   Mobility Status Comments ambulating with walker, prior to surgery no assistive device     Written Expression   Dominant Hand Right     Vision - History   Baseline Vision Wears glasses all the time   Patient Visual Report --  occassional blurry vision that resolves rapidly     Activity Tolerance   Activity Tolerance Comments fatigues easily s/p hospitalizaiton      Cognition   Overall Cognitive Status Within Functional Limits for tasks assessed     Sensation   Light Touch Appears Intact     Coordination   Gross  Motor Movements are Fluid and Coordinated Yes   9 Hole Peg Test Right;Left   Right 9 Hole Peg Test 25.7   Left 9 Hole Peg Test 27.48     ROM / Strength   AROM / PROM / Strength AROM;PROM;Strength     AROM   Overall AROM Comments A/ROM in BUE is WNL     PROM   Overall PROM Comments P/ROM in BUE is WNL     Strength   Overall Strength Comments BUE strength is WNL, except left shoulder flexion 4/5 due to old rotator cuff injury     Hand Function   Right Hand Grip (lbs) 80   Right Hand Lateral Pinch 20 lbs   Right Hand 3 Point Pinch 17 lbs   Left Hand Grip (lbs) 70   Left Hand Lateral Pinch 22 lbs   Left 3 point pinch 17 lbs                         OT Education - 09/03/16 1251    Education provided Yes   Education Details issued patient a list of words to type with his left hand, as he uses the keyboard often and has noticed a slight drag in his left hand.    Person(s) Educated Patient   Methods Explanation;Demonstration;Handout   Comprehension Verbalized understanding;Returned demonstration          OT Short Term Goals - 09/03/16 1411      OT SHORT TERM GOAL #1   Title Patient will be independent with HEP.   Time 1   Period Weeks   Status Achieved                  Plan - 09/03/16 1408    Clinical Impression Statement A:  Patient is a 65 year old male with PMH that includes MI, THA, left rotator cuff injury, Afib, and recent subdural hematoma removed by craniotomy on 08/26/16.  Prior to surgery, patieint was independent  with all daily, work, leisure activities.  Upon evaluation, patient is independent with all B/ADLs, he does not have any deficits that require skilled OT intervention at this time.  He feels his left hand is a bit slow when keyboarding and he was given an HEP to address this.     Rehab Potential Good   OT Frequency 1x / week   OT Duration --  1 week   OT Treatment/Interventions Self-care/ADL training  Plan P:  DC from  skilled OT intervention this date.   OT Home Exercise Plan left hand words for typing   Consulted and Agree with Plan of Care Patient      Patient will benefit from skilled therapeutic intervention in order to improve the following deficits and impairments:     Visit Diagnosis: Other lack of coordination - Plan: Ot plan of care cert/re-cert      G-Codes - 10/02/16 1411    Functional Assessment Tool Used clinical judgement   Functional Limitation Self care   Self Care Current Status [phone]) At least 1 percent but less than 20 percent impaired, limited or restricted   Self Care Goal Status (Y7829) At least 1 percent but less than 20 percent impaired, limited or restricted   Self Care Discharge Status [phone]) At least 1 percent but less than 20 percent impaired, limited or restricted      Problem List Patient Active Problem List   Diagnosis Date Noted  . Subdural bleeding (HCC) 08/27/2016  . Subdural hematoma (HCC) 08/26/2016  . Syncope 07/13/2016  . Weakness of right leg 10/02/2013  . Difficulty in walking(719.7) 10/02/2013  . HTN (hypertension) 04/13/2013  . Hyperlipemia 04/13/2013  . Atrial fibrillation (HCC) 02/14/2013  . Long term (current) use of anticoagulants 02/14/2013  . MYOCARDIAL INFARCTION 06/25/2008  . EXOGENOUS OBESITY 06/18/2008  . Obstructive sleep apnea 06/18/2008    Shirlean Mylar, MHA, OTR/L [phone]  02-Oct-2016, 2:14 PM  [place] [facility] [address] [place], [name], [address] Phone: [phone]   Fax:  [phone]  Name: Joel Rogers MRN: [id number] Date of Birth: 02-26-1951

## 2016-09-03 NOTE — Therapy (Signed)
[place] [facility] [address] [place], [place], [address] Phone: [phone]   Fax:  [phone]  Physical Therapy Evaluation  Patient Details  Name: Joel Rogers MRN: [id number] Date of Birth: 06-Aug-1951 Referring Provider: Maeola HarmanJoseph Stern   Encounter Date: 09/03/2016      PT End of Session - 09/03/16 1443    Visit Number 1   Number of Visits 8   Date for PT Re-Evaluation 10/03/16   Authorization Type [employer]   PT Start Time 1205   PT Stop Time 1240   PT Time Calculation (min) 35 min   Activity Tolerance Patient tolerated treatment well   Behavior [facility] for tasks assessed/performed      Past Medical History:  Diagnosis Date  . Atrial fibrillation (HCC)   . Hyperlipidemia   . Hypertension   . Normal coronary arteries    by cardiac catheterization performed 03/14/06  . Obesity   . Sleep apnea    on CPAP  . Venous insufficiency     Past Surgical History:  Procedure Laterality Date  . CRANIOTOMY Right 08/27/2016   Procedure: CRANIOTOMY HEMATOMA EVACUATION SUBDURAL;  Surgeon: Maeola HarmanJoseph Stern, MD;  [facility];  Service: Neurosurgery;  Laterality: Right;  . GASTRIC BYPASS  09/2008  . TOTAL HIP ARTHROPLASTY     right hip    There were no vitals filed for this visit.       Subjective Assessment - 09/03/16 1202    Subjective Pt admitted into hospital on 08/26/2016 discharged on the 10th with diagnosis of a subdural hematoma.  Joel Rogers states that prior to his hospitalization he was not using any assistive device to ambulate with; at this time he is using a rolling walker. He states that he is using the walker due to still having occasional dizziness when he first comes from sit to stand.   He has 2 steps at his home and is going up them backward with his walker.  He has always had a lift chair due to sit to stand being somewhat difficult for him.     Pertinent History HPI: Joel Rogers is a 65 y.o. male  with PMHx of HTN and HLD who [facility] of sudden-onset constant unchanged lower left extremity weakness beginning 2 days ago. Pt states that he was out at a football game all night before the onset of his symptoms, and woke up experiencing the weakness. Pt endorses an associated headache that he states started around the same time as the weakness. After Eliquis dose stopped and appropriate time given for safe surgical intervention, right craniotomy was performed for evacuation of subdural hematoma   How long can you sit comfortably? no problem   How long can you stand comfortably? no problem    How long can you walk comfortably? walks with his walker for less than 5 minutes .    Currently in Pain? Yes   Pain Score 3    Pain Location Back   Pain Orientation Right   Pain Descriptors / Indicators Aching   Pain Type Acute pain   Pain Onset In the past 7 days   Pain Frequency Occasional   Aggravating Factors  walking    Pain Relieving Factors resting    Effect of Pain on Daily Activities increases             [facility] PT Assessment - 09/03/16 1442      Assessment  Medical Diagnosis Rt subdural hematoma   Onset Date/Surgical Date 08/26/16   Next MD Visit --  sometime next week    Prior Therapy none      Precautions   Precautions None     Restrictions   Weight Bearing Restrictions No     Home Environment   Living Environment Private residence   Type of Home House   Home Access Stairs to enter   Entrance Stairs-Number of Steps 2   Home Layout One level     Prior Function   Level of Independence Independent   Vocation Part time employment   Vocation Requirements sitting    Leisure mowing grass on riding mower      Cognition   Overall Cognitive Status Within Functional Limits for tasks assessed     Functional Tests   Functional tests Single leg stance     Single Leg Stance   Comments unable to single leg stance without hand  hold      Strength   Right Hip Flexion 3+/5   Right Hip Extension 3/5   Left Hip Flexion 3+/5   Left Hip Extension 3-/5   Right Knee Extension 5/5   Left Knee Extension 5/5   Right Ankle Dorsiflexion 4/5   Left Ankle Dorsiflexion 3+/5                   [facility] Adult PT Treatment/Exercise - 09/03/16 0001      Exercises   Exercises Knee/Hip     Knee/Hip Exercises: Standing   Functional Squat 10 reps   Other Standing Knee Exercises marching x 10     Knee/Hip Exercises: Seated   Other Seated Knee/Hip Exercises ankle dorsiflexion plantar flexion x 10                 PT Education - 09/03/16 1443    Education provided Yes   Education Details HEP   Person(s) Educated Patient   Methods Explanation;Demonstration;Handout   Comprehension Verbalized understanding          PT Short Term Goals - 09/03/16 1452      PT SHORT TERM GOAL #1   Title Pt core and LE strength to be increased one grade to allow pt to come sit to stand from lower heights with ease    Time 2   Period Weeks   Status New     PT SHORT TERM GOAL #2   Title Pt to be able to tolerate walking for 40 minutes with his walker  without fatique the next day.   Time 2   Period Weeks   Status New     PT SHORT TERM GOAL #3   Title Pt to be able to single leg stance for 10 seconds bilaterallly to decrease risk of falls    Time 3   Period Weeks           PT Long Term Goals - 09/03/16 1505      PT LONG TERM GOAL #1   Title Pt to be able to single let stance for 15 seconds to allow pt  to feel comfortable walking without any assistive device.    Time 4   Period Weeks   Status New     PT LONG TERM GOAL #2   Title Pt to state that he is beginning to go back to the pool at the [facility] to improve core strength for improved functional ability.    Time 4   Period Weeks  Status New     PT LONG TERM GOAL #3   Title Pt to be able to go up and down his steps in a reciprocal manner   Time 4    Period Weeks   Status New               Plan - 09/23/16 1444    Clinical Impression Statement Joel Rogers is a 65 yo male who had a Rt hemotoma evacuation on 08/26/2016; he was discharged to the hospital on 08/31/2016 and is now being referred to skilled physical therapy to maximize his functional level.  Previous level included ambulating without an a ssistive device and walking in the Pool at the [name].  Examination demonstrates decreased balance, decreased strength and decreased activity  Joel Rogers will benefit from skilled PT to return him to his maximal functional level.    Rehab Potential Good   PT Frequency 2x / week   PT Duration 4 weeks   PT Treatment/Interventions ADLs/Self Care Home Management;Gait training;Stair training;Functional mobility training;Therapeutic activities;Therapeutic exercise;Balance training;Neuromuscular re-education;Patient/family education   PT Next Visit Plan Begin narrow base of support with head turns at mat, tandem stance, functional squats, Ambulation with cane, step ups and sit to stands.       Patient will benefit from skilled therapeutic intervention in order to improve the following deficits and impairments:  Decreased activity tolerance, Decreased balance, Decreased strength, Difficulty walking, Pain  Visit Diagnosis: Weakness of left leg  Unsteadiness on feet      G-Codes - 09-23-16 1458    Functional Assessment Tool Used clinical judgement:  walking with walker now; unable to walk as long as he would like    Functional Limitation Mobility: Walking and moving around   Mobility: Walking and Moving Around Current Status (W1191) At least 40 percent but less than 60 percent impaired, limited or restricted   Mobility: Walking and Moving Around Goal Status [phone]) At least 20 percent but less than 40 percent impaired, limited or restricted       Problem List Patient Active Problem List   Diagnosis Date Noted  . Subdural bleeding (HCC)  08/27/2016  . Subdural hematoma (HCC) 08/26/2016  . Syncope 07/13/2016  . Weakness of right leg 10/02/2013  . Difficulty in walking(719.7) 10/02/2013  . HTN (hypertension) 04/13/2013  . Hyperlipemia 04/13/2013  . Atrial fibrillation (HCC) 02/14/2013  . Long term (current) use of anticoagulants 02/14/2013  . MYOCARDIAL INFARCTION 06/25/2008  . EXOGENOUS OBESITY 06/18/2008  . Obstructive sleep apnea 06/18/2008    Virgina Organ, PT CLT [phone] 09-23-16, 3:05 PM  [facility] [address] [place], [name], [address] Phone: [phone]   Fax:  [phone]  Name: Joel Rogers MRN: [id number] Date of Birth: [birth year]

## 2016-09-06 ENCOUNTER — Telehealth: Payer: Self-pay | Admitting: Cardiovascular Disease

## 2016-09-06 NOTE — Telephone Encounter (Signed)
Spoke to patient.patient recently had a subdural hematoma 08/26/16. Patient had surgery on 08/27/16 craniotomy hematoma evacuation with Dr Venetia MaxonStern. Eliquis was stopped.   patient calling to see when to restart the medication -Eliquis . Patient states he has an appointment on Thursday with neurosurgeon.  RN informed patient - the neurosurgeon would have to make that decision since he stopped medication. Patient verbalized understanding and states he will discuss at appointment on Wednesday with neuro.

## 2016-09-06 NOTE — Telephone Encounter (Signed)
Joel Rogers is calling because Dr.Berry took him off his blood thinner medication so he can have surgery on Friday October 13. He would like to know when can he continue?

## 2016-09-09 ENCOUNTER — Encounter (HOSPITAL_COMMUNITY): Payer: Commercial Managed Care - HMO | Admitting: Occupational Therapy

## 2016-09-13 ENCOUNTER — Ambulatory Visit (HOSPITAL_COMMUNITY): Payer: Commercial Managed Care - HMO | Admitting: Physical Therapy

## 2016-09-13 ENCOUNTER — Encounter (HOSPITAL_COMMUNITY): Payer: Commercial Managed Care - HMO | Admitting: Specialist

## 2016-09-13 DIAGNOSIS — R29898 Other symptoms and signs involving the musculoskeletal system: Secondary | ICD-10-CM

## 2016-09-13 DIAGNOSIS — R278 Other lack of coordination: Secondary | ICD-10-CM | POA: Diagnosis not present

## 2016-09-13 DIAGNOSIS — R2681 Unsteadiness on feet: Secondary | ICD-10-CM

## 2016-09-13 NOTE — Therapy (Signed)
[place] [facility] [address] [place], [place], [address] Phone: [phone]   Fax:  [phone]  Physical Therapy Treatment  Patient Details  Name: Joel LeachRalph L Rogers MRN: [id number] Date of Birth: 1951-02-13 Referring Provider: Maeola HarmanJoseph Stern   Encounter Date: 09/13/2016      PT End of Session - 09/13/16 1746    Visit Number 2   Number of Visits 8   Date for PT Re-Evaluation 10/03/16   Authorization Type [employer]   PT Start Time 1730   PT Stop Time 1812   PT Time Calculation (min) 42 min   Activity Tolerance Patient tolerated treatment well   Behavior During [facility] for tasks assessed/performed      Past Medical History:  Diagnosis Date  . Atrial fibrillation (HCC)   . Hyperlipidemia   . Hypertension   . Normal coronary arteries    by cardiac catheterization performed 03/14/06  . Obesity   . Sleep apnea    on CPAP  . Venous insufficiency     Past Surgical History:  Procedure Laterality Date  . CRANIOTOMY Right 08/27/2016   Procedure: CRANIOTOMY HEMATOMA EVACUATION SUBDURAL;  Surgeon: Maeola HarmanJoseph Stern, MD;  [facility];  Service: Neurosurgery;  Laterality: Right;  . GASTRIC BYPASS  09/2008  . TOTAL HIP ARTHROPLASTY     right hip    There were no vitals filed for this visit.      Subjective Assessment - 09/13/16 1734    Subjective Pt reports things are going well. He is sore but feels that it is from his trying to get back to activity. His biggest issue is his balance.    Pertinent History HPI: Joel LeachRalph L Schwartzkopf is a 65 y.o. male with PMHx of HTN and HLD who [facility] of sudden-onset constant unchanged lower left extremity weakness beginning 2 days ago. Pt states that he was out at a football game all night before the onset of his symptoms, and woke up experiencing the weakness. Pt endorses an associated headache that he states started around the same time as the weakness.  After Eliquis dose stopped and appropriate time given for safe surgical intervention, right craniotomy was performed for evacuation of subdural hematoma   How long can you sit comfortably? no problem   How long can you stand comfortably? no problem    How long can you walk comfortably? walks with his walker for less than 5 minutes .    Currently in Pain? No/denies   Pain Onset In the past 7 days                         [facility] Adult PT Treatment/Exercise - 09/13/16 0001      Ambulation/Gait   Ambulation/Gait Yes   Ambulation/Gait Assistance 4: Min guard;5: Supervision   Ambulation Distance (Feet) 60 Feet   Assistive device Straight cane   Gait Comments use of SPC in each UE, with improved step length, posture noted when using SPC in LUE      Knee/Hip Exercises: Standing   Heel Raises Both;1 set;20 reps   Heel Raises Limitations toe raises      Knee/Hip Exercises: Seated   Clamshell with TheraBand Blue  2x15   Sit to Sand 2 sets;10 reps;with UE support;Other (comment)  UE support to stand, no UE support to sit      Knee/Hip Exercises: Supine   Straight Leg Raises Both;10 reps;2  sets   Straight Leg Raises Limitations with wedge behind back              Balance Exercises - 09/13/16 1759      Balance Exercises: Standing   Standing Eyes Opened Narrow base of support (BOS);Solid surface;1 rep;20 secs;Other (comment)  x10 reps trunk rotation with orange weight ba;;   Tandem Stance 2 reps;20 secs;Eyes open           PT Education - 09/13/16 1745    Education provided Yes   Education Details technique with therex; encouraged pt to scoot to edge of seat prior to standing   Person(s) Educated Patient   Methods Explanation;Verbal cues   Comprehension Verbalized understanding          PT Short Term Goals - 09/03/16 1452      PT SHORT TERM GOAL #1   Title Pt core and LE strength to be increased one grade to allow pt to come sit to stand from lower  heights with ease    Time 2   Period Weeks   Status New     PT SHORT TERM GOAL #2   Title Pt to be able to tolerate walking for 40 minutes with his walker  without fatique the next day.   Time 2   Period Weeks   Status New     PT SHORT TERM GOAL #3   Title Pt to be able to single leg stance for 10 seconds bilaterallly to decrease risk of falls    Time 3   Period Weeks           PT Long Term Goals - 09/03/16 1505      PT LONG TERM GOAL #1   Title Pt to be able to single let stance for 15 seconds to allow pt  to feel comfortable walking without any assistive device.    Time 4   Period Weeks   Status New     PT LONG TERM GOAL #2   Title Pt to state that he is beginning to go back to the pool at the [facility] to improve core strength for improved functional ability.    Time 4   Period Weeks   Status New     PT LONG TERM GOAL #3   Title Pt to be able to go up and down his steps in a reciprocal manner   Time 4   Period Weeks   Status New               Plan - 09/13/16 1749    Clinical Impression Statement Pt arrived today reporting consistent adherence to his HEP. Session focused on improving hip strength and ended with static balance activity. Pt needing several rest breaks during session due to fatigue, but reporting no increase in pain by the end of the session.    Rehab Potential Good   PT Frequency 2x / week   PT Duration 4 weeks   PT Treatment/Interventions ADLs/Self Care Home Management;Gait training;Stair training;Functional mobility training;Therapeutic activities;Therapeutic exercise;Balance training;Neuromuscular re-education;Patient/family education   PT Next Visit Plan Begin narrow base of support with head turns at mat, tandem stance, functional squats, Ambulation with cane, step ups and sit to stands.    PT Home Exercise Plan sit to stand, heel raises, seated abd with blue TB    Recommended Other Services none    Consulted and Agree with Plan of Care  Patient      Patient will benefit from  skilled therapeutic intervention in order to improve the following deficits and impairments:  Decreased activity tolerance, Decreased balance, Decreased strength, Difficulty walking, Pain, Abnormal gait  Visit Diagnosis: Weakness of left leg  Unsteadiness on feet  Other lack of coordination     Problem List Patient Active Problem List   Diagnosis Date Noted  . Subdural bleeding (HCC) 08/27/2016  . Subdural hematoma (HCC) 08/26/2016  . Syncope 07/13/2016  . Weakness of right leg 10/02/2013  . Difficulty in walking(719.7) 10/02/2013  . HTN (hypertension) 04/13/2013  . Hyperlipemia 04/13/2013  . Atrial fibrillation (HCC) 02/14/2013  . Long term (current) use of anticoagulants 02/14/2013  . MYOCARDIAL INFARCTION 06/25/2008  . EXOGENOUS OBESITY 06/18/2008  . Obstructive sleep apnea 06/18/2008    6:20 PM,09/13/16 Marylyn Ishihara PT, DPT [facility] [phone]  [facility] [address] [place], [name], [address] Phone: [phone]   Fax:  [phone]  Name: ROYDEN BULMAN MRN: [id number] Date of Birth: 1951-08-20

## 2016-09-15 ENCOUNTER — Other Ambulatory Visit: Payer: Self-pay | Admitting: Neurosurgery

## 2016-09-15 ENCOUNTER — Encounter (HOSPITAL_COMMUNITY): Payer: Commercial Managed Care - HMO | Admitting: Specialist

## 2016-09-15 ENCOUNTER — Ambulatory Visit (HOSPITAL_COMMUNITY): Payer: Commercial Managed Care - HMO | Admitting: Physical Therapy

## 2016-09-15 ENCOUNTER — Telehealth (HOSPITAL_COMMUNITY): Payer: Self-pay

## 2016-09-15 DIAGNOSIS — I6200 Nontraumatic subdural hemorrhage, unspecified: Secondary | ICD-10-CM

## 2016-09-15 NOTE — Telephone Encounter (Signed)
09/15/16 wife called and cx - she didn't given a reason for cancelling

## 2016-09-20 ENCOUNTER — Ambulatory Visit (HOSPITAL_COMMUNITY): Payer: Commercial Managed Care - HMO | Admitting: Physical Therapy

## 2016-09-20 ENCOUNTER — Encounter (HOSPITAL_COMMUNITY): Payer: Commercial Managed Care - HMO | Admitting: Specialist

## 2016-09-20 DIAGNOSIS — R278 Other lack of coordination: Secondary | ICD-10-CM

## 2016-09-20 DIAGNOSIS — R29898 Other symptoms and signs involving the musculoskeletal system: Secondary | ICD-10-CM

## 2016-09-20 DIAGNOSIS — T148XXA Other injury of unspecified body region, initial encounter: Secondary | ICD-10-CM

## 2016-09-20 DIAGNOSIS — R2681 Unsteadiness on feet: Secondary | ICD-10-CM

## 2016-09-20 NOTE — Therapy (Signed)
[facility] [address] [place], [place], [address] Phone: [phone]   Fax:  [phone]  Physical Therapy Treatment  Patient Details  Name: Joel Rogers MRN: [id number] Date of Birth: 1951/04/24 Referring Provider: Maeola HarmanJoseph Stern   Encounter Date: 09/20/2016      PT End of Session - 09/20/16 1200    Visit Number 3   Number of Visits 8   Date for PT Re-Evaluation 10/03/16   Authorization Type [employer]   Authorization - Visit Number 3   Authorization - Number of Visits 10   PT Start Time 1115   PT Stop Time 1157   PT Time Calculation (min) 42 min   Activity Tolerance Patient tolerated treatment well   Behavior During Therapy [facility] for tasks assessed/performed      Past Medical History:  Diagnosis Date  . Atrial fibrillation (HCC)   . Hyperlipidemia   . Hypertension   . Normal coronary arteries    by cardiac catheterization performed 03/14/06  . Obesity   . Sleep apnea    on CPAP  . Venous insufficiency     Past Surgical History:  Procedure Laterality Date  . CRANIOTOMY Right 08/27/2016   Procedure: CRANIOTOMY HEMATOMA EVACUATION SUBDURAL;  Surgeon: Maeola HarmanJoseph Stern, MD;  [facility];  Service: Neurosurgery;  Laterality: Right;  . GASTRIC BYPASS  09/2008  . TOTAL HIP ARTHROPLASTY     right hip    There were no vitals filed for this visit.      Subjective Assessment - 09/20/16 1117    Subjective Pt states he was really sore after last session and lasted a couple days.  STates "I can't do those exercises anymore".  Explained to him that soreness is expected with new activities.  Pt reports he did "some" of his HEP.   Currently in Pain? No/denies                         [facility]/Exercise - 09/20/16 0001      Knee/Hip Exercises: Standing   Heel Raises Both;1 set;20 reps   Heel Raises Limitations toe raises    Hip Abduction Both;10 reps   Hip Extension Both;10 reps   Lateral Step Up Both;10 reps;Step Height: 4";Hand Hold: 1   Forward Step Up Both;10 reps;Hand Hold: 1;Step Height: 4"   Step Down Both;10 reps;Step Height: 4";Hand Hold: 1   Functional Squat 15 reps   Stairs 4" with SPC, step to 2RT   Other Standing Knee Exercises --     Knee/Hip Exercises: Seated   Sit to Sand 10 reps;without UE support             Balance Exercises - 09/20/16 1144      Balance Exercises: Standing   Standing Eyes Opened Narrow base of support (BOS);Solid surface;1 rep;20 secs;Other (comment)   Tandem Stance 2 reps;20 secs;Eyes open   SLS Eyes open;3 reps  max 8" on Lt, 6" RT             PT Short Term Goals - 09/03/16 1452      PT SHORT TERM GOAL #1   Title Pt core and LE strength to be increased one grade to allow pt to come sit to stand from lower heights with ease    Time 2   Period Weeks   Status New     PT SHORT TERM GOAL #2   Title Pt to be able  to tolerate walking for 40 minutes with his walker  without fatique the next day.   Time 2   Period Weeks   Status New     PT SHORT TERM GOAL #3   Title Pt to be able to single leg stance for 10 seconds bilaterallly to decrease risk of falls    Time 3   Period Weeks           PT Long Term Goals - 09/03/16 1505      PT LONG TERM GOAL #1   Title Pt to be able to single let stance for 15 seconds to allow pt  to feel comfortable walking without any assistive device.    Time 4   Period Weeks   Status New     PT LONG TERM GOAL #2   Title Pt to state that he is beginning to [facility] to improve core strength for improved functional ability.    Time 4   Period Weeks   Status New     PT LONG TERM GOAL #3   Title Pt to be able to go up and down his steps in a reciprocal manner   Time 4   Period Weeks   Status New               Plan - 09/20/16 1200    Clinical Impression Statement Continued with focus on improving B LE strength and noted Rt hip strength.  Encouraged patient to use cane on Lt due to decreased gait deviations due to weak Rt hip. Pt unable to complete stairs reciprocally so added step up/down exercises to help strengthen LE's.  Pt required 3 seated rest breaks during session today with noted dyphoresis.  Progressed sit to stands without use of UE's this session.   Rehab Potential Good   PT Frequency 2x / week   PT Duration 4 weeks   PT Treatment/Interventions ADLs/Self Care Home Management;Gait training;Stair training;Functional mobility training;Therapeutic activities;Therapeutic exercise;Balance training;Neuromuscular re-education;Patient/family education   PT Next Visit Plan Progress dynamic balance and continue to progress bilateral LE strength.     PT Home Exercise Plan sit to stand, heel raises, seated abd with blue TB    Consulted and Agree with Plan of Care Patient      Patient will benefit from skilled therapeutic intervention in order to improve the following deficits and impairments:  Decreased activity tolerance, Decreased balance, Decreased strength, Difficulty walking, Pain, Abnormal gait  Visit Diagnosis: Weakness of left leg  Unsteadiness on feet  Other lack of coordination  Nonhealing nonsurgical wound  Weakness of right leg     Problem List Patient Active Problem List   Diagnosis Date Noted  . Subdural bleeding (HCC) 08/27/2016  . Subdural hematoma (HCC) 08/26/2016  . Syncope 07/13/2016  . Weakness of right leg 10/02/2013  . Difficulty in walking(719.7) 10/02/2013  . HTN (hypertension) 04/13/2013  . Hyperlipemia 04/13/2013  . Atrial fibrillation (HCC) 02/14/2013  . Long term (current) use of anticoagulants 02/14/2013  . MYOCARDIAL INFARCTION 06/25/2008  . EXOGENOUS OBESITY 06/18/2008  . Obstructive sleep apnea 06/18/2008    Lurena Nidamy B Cecilee Rosner, PTA/CLT [phone]  09/20/2016, 12:05 PM  [facility] [address] [place], [place], [address] Phone:  [phone]   Fax:  [phone]  Name: Joel Rogers MRN: [id number] Date of Birth: [birth year]

## 2016-09-22 ENCOUNTER — Telehealth (HOSPITAL_COMMUNITY): Payer: Self-pay | Admitting: Internal Medicine

## 2016-09-22 ENCOUNTER — Encounter (HOSPITAL_COMMUNITY): Payer: Commercial Managed Care - HMO | Admitting: Specialist

## 2016-09-22 ENCOUNTER — Ambulatory Visit (HOSPITAL_COMMUNITY): Payer: Commercial Managed Care - HMO | Admitting: Physical Therapy

## 2016-09-22 NOTE — Telephone Encounter (Signed)
09/22/16 pt called to say he couldn't come today but no specific reason given

## 2016-09-23 ENCOUNTER — Telehealth: Payer: Self-pay | Admitting: Cardiovascular Disease

## 2016-09-23 ENCOUNTER — Ambulatory Visit
Admission: RE | Admit: 2016-09-23 | Discharge: 2016-09-23 | Disposition: A | Discharge status: Home / Self Care | Payer: Commercial Managed Care - HMO | Source: Ambulatory Visit | Attending: Neurosurgery | Admitting: Neurosurgery

## 2016-09-23 DIAGNOSIS — I6200 Nontraumatic subdural hemorrhage, unspecified: Secondary | ICD-10-CM

## 2016-09-23 NOTE — Telephone Encounter (Signed)
Pt of Dr. Allyson SabalBerry  Returned call. Reviewed concerns with patient. He notes he had surgery by Dr. Venetia MaxonStern for subdural hematoma, has remained off of Eliquis and remains off of Eliquis ~1 month now. Noted Dr. Venetia MaxonStern still had concerns about him resuming Eliquis for risk of bleed.  Pt did want to make sure Dr. Allyson SabalBerry was aware he had proceeded w surgery. I have informed pt that Dr. Allyson SabalBerry is out of office this week, and that he can follow up on this later. I am not sure that we would derivate from Dr. Fredrich BirksStern's advice to continue holding Eliquis, if a bleed risk is still noted. Will see if any recommendations by [employer] today.

## 2016-09-23 NOTE — Telephone Encounter (Signed)
New Message  Pt voiced needing a nurse to contact him back about eliquis and he's been off of it for about 27-28 days.  Pt voiced needing someone to contact him back ASAP.  Please f/u

## 2016-09-28 NOTE — Telephone Encounter (Signed)
I have discussed this with Dr. Venetia MaxonStern already and the plan was to hold his oral anticoagulation and follow his subdural hematoma

## 2016-09-28 NOTE — Telephone Encounter (Signed)
Routing to Dr. Berry 

## 2016-09-29 NOTE — Telephone Encounter (Signed)
No answer. Left detail message with results, ok per DPR, and to call back if any questions.  

## 2016-10-04 ENCOUNTER — Emergency Department (HOSPITAL_COMMUNITY)
Admission: EM | Admit: 2016-10-04 | Discharge: 2016-10-04 | Disposition: A | Discharge status: Home / Self Care | Payer: Commercial Managed Care - HMO | Attending: Emergency Medicine | Admitting: Emergency Medicine

## 2016-10-04 ENCOUNTER — Emergency Department (HOSPITAL_COMMUNITY): Payer: Commercial Managed Care - HMO

## 2016-10-04 ENCOUNTER — Encounter (HOSPITAL_COMMUNITY): Payer: Self-pay | Admitting: Emergency Medicine

## 2016-10-04 DIAGNOSIS — I10 Essential (primary) hypertension: Secondary | ICD-10-CM | POA: Diagnosis not present

## 2016-10-04 DIAGNOSIS — Z79899 Other long term (current) drug therapy: Secondary | ICD-10-CM | POA: Insufficient documentation

## 2016-10-04 DIAGNOSIS — M7742 Metatarsalgia, left foot: Secondary | ICD-10-CM | POA: Insufficient documentation

## 2016-10-04 DIAGNOSIS — M79672 Pain in left foot: Secondary | ICD-10-CM | POA: Diagnosis present

## 2016-10-04 MED ORDER — COLCHICINE 0.6 MG PO TABS: 0.6000 mg | ORAL_TABLET | Freq: Two times a day (BID) | ORAL | 0 refills | Status: DC

## 2016-10-04 NOTE — ED Triage Notes (Signed)
Pt reports L great toe pain since Oct 31 after having therapy.

## 2016-10-04 NOTE — ED Provider Notes (Signed)
[facility]-EMER[facility]Y DEPT Provider Note   CSN: [id number] Arrival date & time: 10/04/16  1127  By signing my name below, I, Majel HomerPeyton Lee, attest that this documentation has been prepared under the direction and in the presence of Linwood DibblesJon Lorren Splawn, MD . Electronically Signed: Majel HomerPeyton Lee, Scribe. 10/04/2016. 12:24 PM.  History   Chief Complaint Chief Complaint  Patient presents with  . Foot Pain   The history is provided by the patient. No language interpreter was used.   HPI Comments: Joel Rogers is a 65 y.o. male with PMHx of HTN, who presents to the Emergency Department complaining of gradually worsening, left great toe pain and swelling s/p exercise that occurred ~2 weeks ago. Pt reports he performed many bilateral "calf raises" at physical therapy on 10/31 and began experiencing persistent pain a few days afterwards. He states he soaked his left toe last night in epsom salt and warm water with mild relief of swelling. He notes he attempted to make an appointment with his PCP but there were none available which is why he visited the ED. He denies fever, vomiting, and hx of gout. He notes he has recently been taken off of Eliquis.   Past Medical History:  Diagnosis Date  . Atrial fibrillation (HCC)   . Hyperlipidemia   . Hypertension   . Normal coronary arteries    by cardiac catheterization performed 03/14/06  . Obesity   . Sleep apnea    on CPAP  . Venous insufficiency     Patient Active Problem List   Diagnosis Date Noted  . Subdural bleeding (HCC) 08/27/2016  . Subdural hematoma (HCC) 08/26/2016  . Syncope 07/13/2016  . Weakness of right leg 10/02/2013  . Difficulty in walking(719.7) 10/02/2013  . HTN (hypertension) 04/13/2013  . Hyperlipemia 04/13/2013  . Atrial fibrillation (HCC) 02/14/2013  . Long term (current) use of anticoagulants 02/14/2013  . MYOCARDIAL INFARCTION 06/25/2008  . EXOGENOUS OBESITY 06/18/2008  . Obstructive sleep apnea 06/18/2008    Past Surgical  History:  Procedure Laterality Date  . CRANIOTOMY Right 08/27/2016   Procedure: CRANIOTOMY HEMATOMA EVACUATION SUBDURAL;  Surgeon: Maeola HarmanJoseph Stern, MD;  [facility];  Service: Neurosurgery;  Laterality: Right;  . GASTRIC BYPASS  09/2008  . TOTAL HIP ARTHROPLASTY     right hip    Home Medications    Prior to Admission medications   Medication Sig Start Date End Date Taking? Authorizing Provider  atorvastatin (LIPITOR) 80 MG tablet Take 1 tablet (80 mg total) by mouth daily with breakfast. 12/26/15   Runell GessJonathan J Berry, MD  Calcium Carbonate-Vitamin D (CALCIUM + D PO) Take 1 tablet by mouth daily.    Historical Provider, MD  colchicine 0.6 MG tablet Take 1 tablet (0.6 mg total) by mouth 2 (two) times daily. 10/04/16   Linwood DibblesJon Bertie Simien, MD  doxycycline (VIBRAMYCIN) 100 MG capsule Take 100 mg by mouth 2 (two) times daily. 14 day course starting on 08/22/2016    Historical Provider, MD  losartan (COZAAR) 50 MG tablet Take 50 mg by mouth daily.    Historical Provider, MD  metoprolol (LOPRESSOR) 100 MG tablet Take 1 tablet (100 mg total) by mouth 2 (two) times daily. Patient taking differently: Take 50 mg by mouth 2 (two) times daily.  12/26/15   Runell GessJonathan J Berry, MD  torsemide (DEMADEX) 20 MG tablet Take 20-40 mg by mouth at bedtime.     Historical Provider, MD  vitamin E 400 UNIT capsule Take 400 Units by mouth daily.  Historical Provider, MD    Family History Family History  Problem Relation Age of Onset  . Hypertension Father     Social History Social History  Substance Use Topics  . Smoking status: Never Smoker  . Smokeless tobacco: Never Used  . Alcohol use No     Allergies   Patient has no known allergies.   Review of Systems Review of Systems  Constitutional: Negative for fever.  Gastrointestinal: Negative for vomiting.  Musculoskeletal: Positive for arthralgias (left great toe) and joint swelling (left great toe).   Physical Exam Updated Vital Signs BP 154/95 (BP Location:  Left Arm)   Pulse 71   Temp 97.7 F (36.5 C) (Oral)   Resp 20   Ht 6' (1.829 m)   Wt 289 lb (131.1 kg)   SpO2 100%   BMI 39.20 kg/m   Physical Exam  Constitutional: He appears well-developed and well-nourished. No distress.  HENT:  Head: Normocephalic and atraumatic.  Right Ear: External ear normal.  Left Ear: External ear normal.  Eyes: Conjunctivae are normal. Right eye exhibits no discharge. Left eye exhibits no discharge. No scleral icterus.  Neck: Neck supple. No tracheal deviation present.  Cardiovascular: Normal rate.   Pulmonary/Chest: Effort normal. No stridor. No respiratory distress.  Abdominal: He exhibits no distension.  Musculoskeletal: He exhibits no edema.       Right foot: There is tenderness and bony tenderness.  Mild erythema to the right MTP joint, mild TTP and pain with ROM, no mid-foot tenderness, toes are warm and well perfused.   Neurological: He is alert. Cranial nerve deficit: no gross deficits.  Skin: Skin is warm and dry. No rash noted.  Psychiatric: He has a normal mood and affect.  Nursing note and vitals reviewed.  ED Treatments / Results     Radiology Dg Foot Complete Left  Result Date: 10/04/2016 CLINICAL DATA:  Left big toe pain while doing therapy 09/20/2016 EXAM: LEFT FOOT - COMPLETE 3+ VIEW COMPARISON:  None. FINDINGS: There is no evidence of fracture or dislocation. There is no evidence of arthropathy or other focal bone abnormality. Soft tissues are unremarkable. IMPRESSION: Negative. Electronically Signed   By: Charlett NoseKevin  Dover M.D.   On: 10/04/2016 12:07    Procedures Procedures   DIAGNOSTIC STUDIES:  Oxygen Saturation is 100% on RA, normal by my interpretation.    COORDINATION OF CARE:  12:17 PM Discussed treatment plan with pt at bedside and pt agreed to plan.  Initial Impression / Assessment and Plan / ED Course  I have reviewed the triage vital signs and the nursing notes.  Pertinent labs & imaging results that were  available during my care of the patient were reviewed by me and considered in my medical decision making (see chart for details).  Clinical Course     No fx or abnormality on xray.  ?tendonities from the recent exercises although only one foot.  Mild gout attack?  Pt is not supposed to take nsaisd.  Will try short course of colchicine.  Follow up with PCP or ortho  I personally performed the services described in this documentation, which was scribed in my presence. The recorded information has been reviewed and is accurate.   Final Clinical Impressions(s) / ED Diagnoses   Final diagnoses:  Metatarsalgia of left foot    New Prescriptions New Prescriptions   COLCHICINE 0.6 MG TABLET    Take 1 tablet (0.6 mg total) by mouth 2 (two) times daily.     Cletis AthensJon  Lynelle Doctor, MD 10/04/16 [phone]

## 2016-10-12 ENCOUNTER — Ambulatory Visit: Payer: Commercial Managed Care - HMO | Admitting: Orthopedic Surgery

## 2016-10-21 ENCOUNTER — Telehealth: Payer: Self-pay

## 2016-10-21 NOTE — Telephone Encounter (Signed)
Left message for pt to call back to set up new pt appt with dr nelson 

## 2016-10-25 ENCOUNTER — Other Ambulatory Visit: Payer: Self-pay | Admitting: Neurosurgery

## 2016-10-25 DIAGNOSIS — S065XAA Traumatic subdural hemorrhage with loss of consciousness status unknown, initial encounter: Secondary | ICD-10-CM

## 2016-10-25 DIAGNOSIS — S065X9A Traumatic subdural hemorrhage with loss of consciousness of unspecified duration, initial encounter: Secondary | ICD-10-CM

## 2016-10-26 ENCOUNTER — Ambulatory Visit
Admission: RE | Admit: 2016-10-26 | Discharge: 2016-10-26 | Disposition: A | Discharge status: Home / Self Care | Payer: Commercial Managed Care - HMO | Source: Ambulatory Visit | Attending: Neurosurgery | Admitting: Neurosurgery

## 2016-10-26 DIAGNOSIS — S065XAA Traumatic subdural hemorrhage with loss of consciousness status unknown, initial encounter: Secondary | ICD-10-CM

## 2016-10-26 DIAGNOSIS — S065X9A Traumatic subdural hemorrhage with loss of consciousness of unspecified duration, initial encounter: Secondary | ICD-10-CM

## 2016-11-02 DIAGNOSIS — Z9884 Bariatric surgery status: Secondary | ICD-10-CM | POA: Insufficient documentation

## 2016-11-03 ENCOUNTER — Ambulatory Visit (INDEPENDENT_AMBULATORY_CARE_PROVIDER_SITE_OTHER): Payer: Commercial Managed Care - HMO | Admitting: Family Medicine

## 2016-11-03 ENCOUNTER — Encounter: Payer: Self-pay | Admitting: Family Medicine

## 2016-11-03 VITALS — BP 148/90 | HR 60 | Temp 98.0°F | Resp 18 | Ht 72.0 in | Wt 302.0 lb

## 2016-11-03 DIAGNOSIS — R7309 Other abnormal glucose: Secondary | ICD-10-CM

## 2016-11-03 DIAGNOSIS — I482 Chronic atrial fibrillation, unspecified: Secondary | ICD-10-CM

## 2016-11-03 DIAGNOSIS — Z7689 Persons encountering health services in other specified circumstances: Secondary | ICD-10-CM

## 2016-11-03 DIAGNOSIS — Z9884 Bariatric surgery status: Secondary | ICD-10-CM

## 2016-11-03 DIAGNOSIS — Z8042 Family history of malignant neoplasm of prostate: Secondary | ICD-10-CM

## 2016-11-03 DIAGNOSIS — I10 Essential (primary) hypertension: Secondary | ICD-10-CM

## 2016-11-03 DIAGNOSIS — E7800 Pure hypercholesterolemia, unspecified: Secondary | ICD-10-CM

## 2016-11-03 NOTE — Progress Notes (Signed)
Chief Complaint  Patient presents with  . Establish Care   Patient is here to establish with new primary care. Prior primary care doctor retired and he is not satisfied with the replacement. Long-standing hypertension that is difficult to control. Blood pressure is elevated today. Long-standing atrial fibrillation. Previously on anticoagulation. He had a fall in August of this year. He ended up with a subdural hematoma. He is recovered from this at this point. He is no longer on anticoagulation. He is taking an aspirin a day. He will follow-up with his cardiologist. He has a history of coronary artery disease. Denies any chest pain or shortness of breath. He has a history of sleep apnea and is compliant with his CPAP. Patient states that he had a gastric bypass for obesity in 2009. At that time he had a colonoscopy that was normal. He has lost approximately 100 pounds since that time. Certainly he states that he has been over eating and gaining weight, he understands the importance of getting back on a regular eating program and keeping his weight down. Patient states she's had a flu shot and a Prevnar. He believes his tetanus is up-to-date. He declines shingles shot. Shingles shot was discussed with him and recommended. He states he does try to exercise regularly[facility]. He states this is easiest on his joints. Patient lives at home with his wife Adela LankJacqueline. 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   Patient Active Problem List   Diagnosis Date Noted  . Family history of prostate cancer in father 11/03/2016  . Status post gastric bypass for obesity 11/02/2016  . Subdural hematoma (HCC) 08/26/2016  . Syncope 07/13/2016  . HTN (hypertension) 04/13/2013  . Hyperlipemia 04/13/2013  . Atrial fibrillation (HCC) 02/14/2013  . Long term (current) use of anticoagulants 02/14/2013  . MYOCARDIAL INFARCTION 06/25/2008  . EXOGENOUS OBESITY  06/18/2008  . Obstructive sleep apnea 06/18/2008    Outpatient Encounter Prescriptions as of 11/03/2016  Medication Sig  . atorvastatin (LIPITOR) 80 MG tablet Take 1 tablet (80 mg total) by mouth daily with breakfast.  . Calcium Carbonate-Vitamin D (CALCIUM + D PO) Take 1 tablet by mouth daily.  . colchicine 0.6 MG tablet Take 1 tablet (0.6 mg total) by mouth 2 (two) times daily.  Marland Kitchen. losartan (COZAAR) 50 MG tablet Take 50 mg by mouth daily.  . metoprolol (LOPRESSOR) 100 MG tablet Take 1 tablet (100 mg total) by mouth 2 (two) times daily. (Patient taking differently: Take 50 mg by mouth 2 (two) times daily. )  . vitamin E 400 UNIT capsule Take 400 Units by mouth daily.   No facility-administered encounter medications on file as of 11/03/2016.     Past Medical History:  Diagnosis Date  . Arthritis   . Atrial fibrillation (HCC)   . Diabetes mellitus without complication (HCC)    borderline  . Hyperlipidemia   . Hypertension   . Myocardial infarction   . Normal coronary arteries    by cardiac catheterization performed 03/14/06  . Obesity   . Sleep apnea    on CPAP  . Venous insufficiency     Past Surgical History:  Procedure Laterality Date  . CRANIOTOMY Right 08/27/2016   Procedure: CRANIOTOMY HEMATOMA EVACUATION SUBDURAL;  Surgeon: Maeola HarmanJoseph Stern, MD;  [facility];  Service: Neurosurgery;  Laterality: Right;  . GASTRIC BYPASS  09/2008  . TOTAL HIP ARTHROPLASTY     right  hip    Social History   Social History  . Marital status: Married    Spouse name: Adela LankJacqueline  . Number of children: 2  . Years of education: 16   Occupational History  . maint [employer]     disability retirement   Social History Main Topics  . Smoking status: Never Smoker  . Smokeless tobacco: Never Used  . Alcohol use No  . Drug use: No  . Sexual activity: Not Currently   Other Topics Concern  . Not on file   Social History Narrative   Doctorate in ministry   Retired Data processing managermaintenance mechanic     Disability from hip replacement   Lives with wife Adela LankJacqueline   [facility]ercises [facility] the [employer] - [facility] exercise    Family History  Problem Relation Age of Onset  . Hypertension Father   . Cancer Father     prob prostate  . Alzheimer's disease Mother   . Alzheimer's disease Maternal Grandfather   . Cancer Sister     breast cancer  . Cancer Brother     "brain tumor"    Review of Systems  Constitutional: Negative for chills, fever and weight loss.       Recent weight gain  HENT: Negative for congestion and hearing loss.        Sees dentist as needed  Eyes: Negative for blurred vision and pain.       Sees eye doctor every 2 years  Respiratory: Negative for cough and shortness of breath.   Cardiovascular: Negative for chest pain, palpitations and leg swelling.  Gastrointestinal: Negative for abdominal pain, constipation, diarrhea and heartburn.  Genitourinary: Negative for dysuria and frequency.  Musculoskeletal: Positive for joint pain. Negative for falls and myalgias.  Neurological: Negative for dizziness, seizures and headaches.  Psychiatric/Behavioral: Negative for depression. The patient is not nervous/anxious and does not have insomnia.    BP (!) 148/90   Pulse 60   Temp 98 F (36.7 C) (Oral)   Resp 18   Ht 6' (1.829 m)   Wt (!) 302 lb 0.6 oz (137 kg)   SpO2 97%   BMI 40.96 kg/m   Physical Exam  Constitutional: He is oriented to person, place, and time. He appears well-developed and well-nourished.  Obesity, largely abdominal  HENT:  Head: Normocephalic and atraumatic.  Mouth/Throat: Oropharynx is clear and moist.  Eyes: Conjunctivae are normal. Pupils are equal, round, and reactive to light.  Neck: Normal range of motion. Neck supple. No thyromegaly present.  Cardiovascular: Normal rate, regular rhythm and normal heart sounds.   Pulmonary/Chest: Breath sounds normal.  Lymphadenopathy:    He has no cervical adenopathy.  Neurological: He is alert and oriented to person,  place, and time.  Gait mildly antalgic  Skin: Skin is warm and dry.  Psychiatric: He has a normal mood and affect. His behavior is normal. Thought content normal.  Nursing note and vitals reviewed.  ASSESSMENT/PLAN:  1. Encounter to establish care with new doctor   2. Status post gastric bypass for obesity   3. Chronic atrial fibrillation (HCC)   4. Essential hypertension   5. Family history of prostate cancer in father   Review the patient's old records before making decision regarding what additional testing may be necessary. He believes he had lab work recently.  Patient Instructions  Need old records  Come back in January for physical   Lab work before that visit  The lab is next door    Letta PateYvonne Sue  Delton See, MD

## 2016-11-03 NOTE — Patient Instructions (Addendum)
Need old records  Come back in January for physical   Lab work before that visit  The lab is next door

## 2016-11-24 ENCOUNTER — Encounter: Payer: Self-pay | Admitting: Cardiovascular Disease

## 2016-11-24 ENCOUNTER — Ambulatory Visit (INDEPENDENT_AMBULATORY_CARE_PROVIDER_SITE_OTHER): Payer: Medicare Other | Admitting: Cardiovascular Disease

## 2016-11-24 VITALS — BP 134/86 | HR 65 | Ht 73.0 in | Wt 301.0 lb

## 2016-11-24 DIAGNOSIS — I10 Essential (primary) hypertension: Secondary | ICD-10-CM | POA: Diagnosis not present

## 2016-11-24 DIAGNOSIS — S065X9A Traumatic subdural hemorrhage with loss of consciousness of unspecified duration, initial encounter: Secondary | ICD-10-CM

## 2016-11-24 DIAGNOSIS — S065XAA Traumatic subdural hemorrhage with loss of consciousness status unknown, initial encounter: Secondary | ICD-10-CM

## 2016-11-24 DIAGNOSIS — I482 Chronic atrial fibrillation, unspecified: Secondary | ICD-10-CM

## 2016-11-24 DIAGNOSIS — I6200 Nontraumatic subdural hemorrhage, unspecified: Secondary | ICD-10-CM

## 2016-11-24 DIAGNOSIS — E7800 Pure hypercholesterolemia, unspecified: Secondary | ICD-10-CM | POA: Diagnosis not present

## 2016-11-24 DIAGNOSIS — G4733 Obstructive sleep apnea (adult) (pediatric): Secondary | ICD-10-CM | POA: Diagnosis not present

## 2016-11-24 NOTE — Assessment & Plan Note (Signed)
History of chronic A. fib rate controlled previously on oral anticoagulation with which was discontinued after a traumatic subdural hematoma followed by his neurosurgeon Dr. Venetia MaxonStern. We'll continue to hold his anticoagulation.

## 2016-11-24 NOTE — Patient Instructions (Signed)

## 2016-11-24 NOTE — Assessment & Plan Note (Signed)
History of subdural hematoma in August of this year after falling and hitting his head. This was followed by Dr. Venetia MaxonStern. Apparently there was midline shift and ultimately his anticoagulation was discontinued and this improved. The decision was to not restart oral anticoagulation.

## 2016-11-24 NOTE — Assessment & Plan Note (Signed)
History of hypertension blood pressure measured 134/82.

## 2016-11-24 NOTE — Progress Notes (Signed)
11/24/2016 Joel Rogers   1951-07-06  [id number]  Primary Physician Joel Moore, MD Primary Cardiologist: Joel Gess MD Joel Rogers, [place]  HPI:  The patient is a very pleasant 66 year old, severely overweight, married, African American male, father of 2, grandfather to 4 grandchildren who I last saw 07/13/16. He has a history of chronic atrial fibrillation, rate controlled on Coumadin anticoagulation. He has obstructive sleep apnea on CPAP, hypertension, and hyperlipidemia. He was catheterized by Dr. Lavonne Rogers in 2007 and found to have normal coronary arteries and normal coronary function. He had laparoscopic Roux-en-Y gastric bypass surgery in November of 2009 at [facility] and has lost over 140 pounds. He feels clinically improved. His last lipid profile performed a year ago was excellent with an LDL of 82, HDL of 52, total cholesterol 142. He complains of right greater than left lower extremity swelling, which has been chronic. He has some venous reflux in his lesser saphenous vein, probably not amenable to endovenous ablation. Since I saw him 9 months ago he's remained stable. He recently had an episode of syncope in the morning while getting out of bed without other symptoms. He was taken to [facility] emergency room where his workup was unrevealing. He did apparently hit his head and soft with developed a subdural hematoma followed by Dr. Venetia Rogers. This has improved over time. Hydration was discontinued as a result of this.  Current Outpatient Prescriptions  Medication Sig Dispense Refill  . atorvastatin (LIPITOR) 80 MG tablet Take 1 tablet (80 mg total) by mouth daily with breakfast. 90 tablet 3  . Calcium Carbonate-Vitamin D (CALCIUM + D PO) Take 1 tablet by mouth daily.    . colchicine 0.6 MG tablet Take 1 tablet (0.6 mg total) by mouth 2 (two) times daily. 14 tablet 0  . losartan (COZAAR) 50 MG tablet Take 50 mg by mouth daily.    . metoprolol (LOPRESSOR) 100 MG  tablet Take 1 tablet (100 mg total) by mouth 2 (two) times daily. (Patient taking differently: Take 50 mg by mouth 2 (two) times daily. ) 180 tablet 3  . tadalafil (CIALIS) 20 MG tablet Take 20 mg by mouth daily as needed for erectile dysfunction.    . vitamin E 400 UNIT capsule Take 400 Units by mouth daily.     No current facility-administered medications for this visit.     No Known Allergies  Social History   Social History  . Marital status: Married    Spouse name: Joel Rogers  . Number of children: 2  . Years of education: 16   Occupational History  . maint Curator     disability retirement   Social History Main Topics  . Smoking status: Never Smoker  . Smokeless tobacco: Never Used  . Alcohol use No  . Drug use: No  . Sexual activity: Not Currently   Other Topics Concern  . Not on file   Social History Narrative   Doctorate in ministry   Retired Data processing manager   Disability from hip replacement   Lives with wife Joel Rogers   Exercises at the [employer] - water exercise     Review of Systems: General: negative for chills, fever, night sweats or weight changes.  Cardiovascular: negative for chest pain, dyspnea on exertion, edema, orthopnea, palpitations, paroxysmal nocturnal dyspnea or shortness of breath Dermatological: negative for rash Respiratory: negative for cough or wheezing Urologic: negative for hematuria Abdominal: negative for nausea, vomiting, diarrhea, bright red blood per  rectum, melena, or hematemesis Neurologic: negative for visual changes, syncope, or dizziness All other systems reviewed and are otherwise negative except as noted above.    Blood pressure 134/86, pulse 65, height 6\' 1"  (1.854 m), weight (!) 301 lb (136.5 kg).  General appearance: alert and no distress Neck: no adenopathy, no JVD, supple, symmetrical, trachea midline, thyroid not enlarged, symmetric, no tenderness/mass/nodules and Soft bilateral carotid bruits Lungs: no  adenopathy, no JVD, supple, symmetrical, trachea midline, thyroid not enlarged, symmetric, no tenderness/mass/nodules and Soft bilateral carotid bruits Heart: irregularly irregular rhythm Extremities: extremities normal, atraumatic, no cyanosis or edema  EKG atrial fibrillation with a ventricular response of 65 and right bundle branch block with septal Q waves. I personally reviewed this EKG  ASSESSMENT AND PLAN:   Obstructive sleep apnea History of obstructive sleep apnea on C Pap.  Atrial fibrillation History of chronic A. fib rate controlled previously on oral anticoagulation with which was discontinued after a traumatic subdural hematoma followed by his neurosurgeon Dr. Venetia MaxonStern. We'll continue to hold his anticoagulation.  HTN (hypertension) History of hypertension blood pressure measured 134/82.  Hyperlipemia History of hyperlipidemia on statin therapy with lipid profile performed 01/22/16 revealing total cholesterol 170, LDL 86 and HDL of 73  Subdural hematoma (HCC) History of subdural hematoma in August of this year after falling and hitting his head. This was followed by Dr. Venetia MaxonStern. Apparently there was midline shift and ultimately his anticoagulation was discontinued and this improved. The decision was to not restart oral anticoagulation.      Joel GessJonathan J. Roxanne Panek MD FACP,FACC,FAHA, [facility] 11/24/2016 12:37 PM

## 2016-11-24 NOTE — Assessment & Plan Note (Signed)
History of obstructive sleep apnea on C Pap 

## 2016-11-24 NOTE — Assessment & Plan Note (Signed)
History of hyperlipidemia on statin therapy with lipid profile performed 01/22/16 revealing total cholesterol 170, LDL 86 and HDL of 73

## 2016-11-26 ENCOUNTER — Other Ambulatory Visit: Payer: Self-pay | Admitting: Family Medicine

## 2016-11-26 DIAGNOSIS — R7309 Other abnormal glucose: Secondary | ICD-10-CM

## 2016-11-26 DIAGNOSIS — Z8042 Family history of malignant neoplasm of prostate: Secondary | ICD-10-CM

## 2016-11-26 DIAGNOSIS — E7800 Pure hypercholesterolemia, unspecified: Secondary | ICD-10-CM | POA: Diagnosis not present

## 2016-11-26 DIAGNOSIS — Z9884 Bariatric surgery status: Secondary | ICD-10-CM | POA: Diagnosis not present

## 2016-11-26 DIAGNOSIS — I10 Essential (primary) hypertension: Secondary | ICD-10-CM

## 2016-11-26 DIAGNOSIS — E559 Vitamin D deficiency, unspecified: Secondary | ICD-10-CM | POA: Diagnosis not present

## 2016-11-26 LAB — LIPID PANEL
CHOL/HDL RATIO: 2.4 ratio (ref <5.0)
Cholesterol: 144 mg/dL (ref <200)
HDL: 61 mg/dL (ref >40)
LDL CALC: 74 mg/dL (ref <100)
Triglycerides: 43 mg/dL (ref <150)
VLDL: 9 mg/dL (ref <30)

## 2016-11-26 LAB — PSA: PSA: 1 ng/mL (ref <=4.0)

## 2016-11-26 LAB — VITAMIN B12: Vitamin B-12: 374 pg/mL (ref 200–1100)

## 2016-11-26 LAB — TSH: TSH: 0.58 mIU/L (ref 0.40–4.50)

## 2016-11-26 LAB — HEMOGLOBIN A1C
HEMOGLOBIN A1C: 5.6 % (ref <5.7)
Mean Plasma Glucose: 114 mg/dL

## 2016-11-26 NOTE — Addendum Note (Signed)
Addended by: Abner GreenspanHUDY, BRANDI H on: 11/26/2016 01:55 PM   Modules accepted: Orders

## 2016-11-27 LAB — URINALYSIS, MICROSCOPIC ONLY
Casts: NONE SEEN [LPF]
Yeast: NONE SEEN [HPF]

## 2016-11-27 LAB — URINALYSIS, ROUTINE W REFLEX MICROSCOPIC
Bilirubin Urine: NEGATIVE
GLUCOSE, UA: NEGATIVE
Hgb urine dipstick: NEGATIVE
Ketones, ur: NEGATIVE
LEUKOCYTES UA: NEGATIVE
Nitrite: NEGATIVE
Specific Gravity, Urine: 1.018 (ref 1.001–1.035)
pH: 6 (ref 5.0–8.0)

## 2016-11-27 LAB — VITAMIN D 25 HYDROXY (VIT D DEFICIENCY, FRACTURES): Vit D, 25-Hydroxy: 11 ng/mL — ABNORMAL LOW (ref 30–100)

## 2016-11-29 ENCOUNTER — Encounter: Payer: Self-pay | Admitting: Family Medicine

## 2016-11-29 DIAGNOSIS — E559 Vitamin D deficiency, unspecified: Secondary | ICD-10-CM

## 2016-11-29 MED ORDER — VITAMIN D (ERGOCALCIFEROL) 1.25 MG (50000 UNIT) PO CAPS: 50000.0000 [IU] | ORAL_CAPSULE | ORAL | 1 refills | Status: DC

## 2016-12-02 ENCOUNTER — Encounter: Payer: Self-pay | Admitting: Family Medicine

## 2016-12-02 ENCOUNTER — Ambulatory Visit (INDEPENDENT_AMBULATORY_CARE_PROVIDER_SITE_OTHER): Payer: Medicare Other | Admitting: Family Medicine

## 2016-12-02 VITALS — BP 126/88 | HR 72 | Temp 98.0°F | Resp 18 | Ht 73.0 in | Wt 290.1 lb

## 2016-12-02 DIAGNOSIS — Z0000 Encounter for general adult medical examination without abnormal findings: Secondary | ICD-10-CM | POA: Diagnosis not present

## 2016-12-02 DIAGNOSIS — E559 Vitamin D deficiency, unspecified: Secondary | ICD-10-CM

## 2016-12-02 MED ORDER — ASPIRIN EC 81 MG PO TBEC: 81.0000 mg | DELAYED_RELEASE_TABLET | Freq: Every day | ORAL | 3 refills | Status: DC

## 2016-12-02 MED ORDER — COLCHICINE 0.6 MG PO TABS: 0.6000 mg | ORAL_TABLET | Freq: Two times a day (BID) | ORAL | 1 refills | Status: DC

## 2016-12-02 NOTE — Patient Instructions (Signed)
Take the low dose aspirin daily Get one that is "coated" Take a multiple vitamin daily Take the vitamin D super dose once a week for 12 weeks After this start on vitamin d 2000 U a day I have refilled the colchicine  Exercise is important for your health Congratulations on weight loss  See me every 6 months

## 2016-12-02 NOTE — Progress Notes (Signed)
Chief Complaint  Patient presents with  . Annual Exam   Patient is here for an annual physical examination. His health maintenance is reviewed and is up-to-date. Colonoscopy up-to-date. Immunizations are up-to-date. Discussed the shingles shot again, patient refuses He's lost 10 pounds since his last visit and is congratulated. Recent laboratory work is reviewed with him. He has vitamin D deficiency. He's had a bariatric surgery. I explained him that he needs to take vitamin supplements for life  Patient Active Problem List   Diagnosis Date Noted  . Vitamin D deficiency 12/02/2016  . Family history of prostate cancer in father 11/03/2016  . Status post gastric bypass for obesity 11/02/2016  . Subdural hematoma (HCC) 08/26/2016  . Syncope 07/13/2016  . HTN (hypertension) 04/13/2013  . Hyperlipemia 04/13/2013  . Atrial fibrillation (HCC) 02/14/2013  . Long term (current) use of anticoagulants 02/14/2013  . MYOCARDIAL INFARCTION 06/25/2008  . EXOGENOUS OBESITY 06/18/2008  . Obstructive sleep apnea 06/18/2008    Outpatient Encounter Prescriptions as of 12/02/2016  Medication Sig  . atorvastatin (LIPITOR) 80 MG tablet Take 1 tablet (80 mg total) by mouth daily with breakfast.  . Calcium Carbonate-Vitamin D (CALCIUM + D PO) Take 1 tablet by mouth daily.  . colchicine 0.6 MG tablet Take 1 tablet (0.6 mg total) by mouth 2 (two) times daily.  Marland Kitchen. losartan (COZAAR) 50 MG tablet Take 50 mg by mouth daily.  . metoprolol (LOPRESSOR) 100 MG tablet Take 1 tablet (100 mg total) by mouth 2 (two) times daily. (Patient taking differently: Take 50 mg by mouth 2 (two) times daily. )  . tadalafil (CIALIS) 20 MG tablet Take 20 mg by mouth daily as needed for erectile dysfunction.  . Vitamin D, Ergocalciferol, (DRISDOL) 50000 units CAPS capsule Take 1 capsule (50,000 Units total) by mouth every 7 (seven) days.  . vitamin E 400 UNIT capsule Take 400 Units by mouth daily.   No facility-administered  encounter medications on file as of 12/02/2016.     No Known Allergies  Review of Systems  Constitutional: Negative for fatigue and unexpected weight change.  HENT: Negative.  Negative for congestion and dental problem.   Eyes: Negative.  Negative for photophobia and visual disturbance.  Respiratory: Negative for cough and shortness of breath.   Cardiovascular: Negative for chest pain, palpitations and leg swelling.  Gastrointestinal: Negative for constipation and diarrhea.  Genitourinary: Negative for difficulty urinating and frequency.  Musculoskeletal: Positive for arthralgias and gait problem.       Chronic pain right hip after arthroplasty  Neurological: Negative for dizziness and headaches.  Psychiatric/Behavioral: Negative.  Negative for sleep disturbance.       Compliant with CPAP    BP 126/88 (BP Location: Right Arm, Patient Position: Sitting, Cuff Size: Large)   Pulse 72   Temp 98 F (36.7 C) (Oral)   Resp 18   Ht 6\' 1"  (1.854 m)   Wt 290 lb 1.9 oz (131.6 kg)   SpO2 97%   BMI 38.28 kg/m   Physical Exam BP 126/88 (BP Location: Right Arm, Patient Position: Sitting, Cuff Size: Large)   Pulse 72   Temp 98 F (36.7 C) (Oral)   Resp 18   Ht 6\' 1"  (1.854 m)   Wt 290 lb 1.9 oz (131.6 kg)   SpO2 97%   BMI 38.28 kg/m   General Appearance:    Alert, cooperative, no distress, appears stated age. Morbid obesity. Antalgic gait.   Head:    Normocephalic, without obvious  abnormality, atraumatic  Eyes:    PERRL, conjunctiva/corneas clear, EOM's intact, fundi    benign, both eyes       Ears:    Normal TM's and external ear canals, both ears  Nose:   Nares normal, septum midline, mucosa normal, no drainage   or sinus tenderness  Throat:   Lips, mucosa, and tongue normal; teeth and gums normal  Neck:   Supple, symmetrical, trachea midline, no adenopathy;       thyroid:  No enlargement/tenderness/nodules; no carotid   bruit   Back:     Symmetric, no curvature, ROM normal, no  CVA tenderness  Lungs:     Clear to auscultation bilaterally, respirations unlabored  Chest wall:    No tenderness or deformity  Heart:    Regular rate and rhythm, S1 and S2 normal, no murmur, rub   or gallop  Abdomen:     Soft, non-tender, bowel sounds active all four quadrants,    no masses, no organomegaly. Large pannus   Extremities:   Extremities normal, atraumatic, no cyanosis or edema  Pulses:   2+ and symmetric all extremities  Skin:   Skin color, texture, turgor normal, no rashes or lesions  Lymph nodes:   Cervical, supraclavicular, and axillary nodes normal  Neurologic:   Normal strength, sensation and reflexes      throughout    ASSESSMENT/PLAN:  1. Vitamin D deficiency Discussed vitamin D replacement  2. Annual physical exam no unexpected findings   Patient Instructions  Take the low dose aspirin daily Get one that is "coated" Take a multiple vitamin daily Take the vitamin D super dose once a week for 12 weeks After this start on vitamin d 2000 U a day I have refilled the colchicine  Exercise is important for your health Congratulations on weight loss  See me every 6 months    Eustace Moore, MD

## 2016-12-26 ENCOUNTER — Other Ambulatory Visit: Payer: Self-pay | Admitting: Cardiovascular Disease

## 2016-12-28 DIAGNOSIS — H40013 Open angle with borderline findings, low risk, bilateral: Secondary | ICD-10-CM | POA: Diagnosis not present

## 2016-12-28 DIAGNOSIS — H524 Presbyopia: Secondary | ICD-10-CM | POA: Diagnosis not present

## 2016-12-28 DIAGNOSIS — H2513 Age-related nuclear cataract, bilateral: Secondary | ICD-10-CM | POA: Diagnosis not present

## 2016-12-28 DIAGNOSIS — H5213 Myopia, bilateral: Secondary | ICD-10-CM | POA: Diagnosis not present

## 2017-02-01 ENCOUNTER — Other Ambulatory Visit: Payer: Self-pay | Admitting: Cardiovascular Disease

## 2017-02-03 ENCOUNTER — Other Ambulatory Visit: Payer: Self-pay

## 2017-02-03 MED ORDER — LOSARTAN POTASSIUM 50 MG PO TABS: 50.0000 mg | ORAL_TABLET | Freq: Every day | ORAL | 3 refills | Status: DC

## 2017-02-03 NOTE — Telephone Encounter (Signed)
Seen 12/02/16

## 2017-03-23 ENCOUNTER — Ambulatory Visit (INDEPENDENT_AMBULATORY_CARE_PROVIDER_SITE_OTHER): Payer: Medicare Other

## 2017-03-23 ENCOUNTER — Telehealth: Payer: Self-pay

## 2017-03-23 VITALS — BP 120/82 | HR 60 | Temp 98.5°F | Ht 73.0 in | Wt 308.1 lb

## 2017-03-23 DIAGNOSIS — Z1159 Encounter for screening for other viral diseases: Secondary | ICD-10-CM | POA: Diagnosis not present

## 2017-03-23 DIAGNOSIS — Z0000 Encounter for general adult medical examination without abnormal findings: Secondary | ICD-10-CM

## 2017-03-23 MED ORDER — TADALAFIL 20 MG PO TABS: 20.0000 mg | ORAL_TABLET | Freq: Every day | ORAL | 0 refills | Status: DC | PRN

## 2017-03-23 NOTE — Patient Instructions (Addendum)
Joel Rogers , Thank you for taking time to come for your Medicare Wellness Visit. I appreciate your ongoing commitment to your health goals. Please review the following plan we discussed and let me know if I can assist you in the future.   Screening recommendations/referrals: Colonoscopy: Up to date, next due 11/2017 Diabetic Eye Exam: Up to date, next due 12/2017 Recommended yearly dental visit for hygiene and checkup  Vaccinations: Influenza vaccine: Up to date, next due 06/2017 Pneumococcal vaccine: Up to date, due for Prevnar 13 08/2017 Tdap vaccine: Up to date, next due 02/2018 Shingles vaccine: Due, declines    Advanced directives: Please bring a copy of your POA (Power of [place]) and/or Living Will to your next appointment.   Conditions/risks identified: Obese, recommend starting a routine exercise program at least 3 days a week for 30-45 minutes at a time as tolerated.   Next appointment: Follow up with Dr. Delton See on 06/01/2017 at 10:40 am. Follow up in 1 year for your annual wellness visit.  Preventive Care 66 Years and Older, Male Preventive care refers to lifestyle choices and visits with your health care provider that can promote health and wellness. What does preventive care include?  A yearly physical exam. This is also called an annual well check.  Dental exams once or twice a year.  Routine eye exams. Ask your health care provider how often you should have your eyes checked.  Personal lifestyle choices, including:  Daily care of your teeth and gums.  Regular physical activity.  Eating a healthy diet.  Avoiding tobacco and drug use.  Limiting alcohol use.  Practicing safe sex.  Taking low doses of aspirin every day.  Taking vitamin and mineral supplements as recommended by your health care provider. What happens during an annual well check? The services and screenings done by your health care provider during your annual well check will depend on your age,  overall health, lifestyle risk factors, and family history of disease. Counseling  Your health care provider may ask you questions about your:  Alcohol use.  Tobacco use.  Drug use.  Emotional well-being.  Home and relationship well-being.  Sexual activity.  Eating habits.  History of falls.  Memory and ability to understand (cognition).  Work and work Astronomer. Screening  You may have the following tests or measurements:  Height, weight, and BMI.  Blood pressure.  Lipid and cholesterol levels. These may be checked every 5 years, or more frequently if you are over 51 years old.  Skin check.  Lung cancer screening. You may have this screening every year starting at age 35 if you have a 30-pack-year history of smoking and currently smoke or have quit within the past 15 years.  Fecal occult blood test (FOBT) of the stool. You may have this test every year starting at age 67.  Flexible sigmoidoscopy or colonoscopy. You may have a sigmoidoscopy every 5 years or a colonoscopy every 10 years starting at age 28.  Prostate cancer screening. Recommendations will vary depending on your family history and other risks.  Hepatitis C blood test.  Hepatitis B blood test.  Sexually transmitted disease (STD) testing.  Diabetes screening. This is done by checking your blood sugar (glucose) after you have not eaten for a while (fasting). You may have this done every 1-3 years.  Abdominal aortic aneurysm (AAA) screening. You may need this if you are a current or former smoker.  Osteoporosis. You may be screened starting at age 27 if you  are at high risk. Talk with your health care provider about your test results, treatment options, and if necessary, the need for more tests. Vaccines  Your health care provider may recommend certain vaccines, such as:  Influenza vaccine. This is recommended every year.  Tetanus, diphtheria, and acellular pertussis (Tdap, Td) vaccine. You may  need a Td booster every 10 years.  Zoster vaccine. You may need this after age 38.  Pneumococcal 13-valent conjugate (PCV13) vaccine. One dose is recommended after age 67.  Pneumococcal polysaccharide (PPSV23) vaccine. One dose is recommended after age 54. Talk to your health care provider about which screenings and vaccines you need and how often you need them. This information is not intended to replace advice given to you by your health care provider. Make sure you discuss any questions you have with your health care provider. Document Released: 12/05/2015 Document Revised: 07/28/2016 Document Reviewed: 09/09/2015 Elsevier Interactive Patient Education  2017 [place] Prevention in the Home Falls can cause injuries. They can happen to people of all ages. There are many things you can do to make your home safe and to help prevent falls. What can I do on the outside of my home?  Regularly fix the edges of walkways and driveways and fix any cracks.  Remove anything that might make you trip as you walk through a door, such as a raised step or threshold.  Trim any bushes or trees on the path to your home.  Use bright outdoor lighting.  Clear any walking paths of anything that might make someone trip, such as rocks or tools.  Regularly check to see if handrails are loose or broken. Make sure that both sides of any steps have handrails.  Any raised decks and porches should have guardrails on the edges.  Have any leaves, snow, or ice cleared regularly.  Use sand or salt on walking paths during winter.  Clean up any spills in your garage right away. This includes oil or grease spills. What can I do in the bathroom?  Use night lights.  Install grab bars by the toilet and in the tub and shower. Do not use towel bars as grab bars.  Use non-skid mats or decals in the tub or shower.  If you need to sit down in the shower, use a plastic, non-slip stool.  Keep the floor  dry. Clean up any water that spills on the floor as soon as it happens.  Remove soap buildup in the tub or shower regularly.  Attach bath mats securely with double-sided non-slip rug tape.  Do not have throw rugs and other things on the floor that can make you trip. What can I do in the bedroom?  Use night lights.  Make sure that you have a light by your bed that is easy to reach.  Do not use any sheets or blankets that are too big for your bed. They should not hang down onto the floor.  Have a firm chair that has side arms. You can use this for support while you get dressed.  Do not have throw rugs and other things on the floor that can make you trip. What can I do in the kitchen?  Clean up any spills right away.  Avoid walking on wet floors.  Keep items that you use a lot in easy-to-reach places.  If you need to reach something above you, use a strong step stool that has a grab bar.  Keep electrical cords out  of the way.  Do not use floor polish or wax that makes floors slippery. If you must use wax, use non-skid floor wax.  Do not have throw rugs and other things on the floor that can make you trip. What can I do with my stairs?  Do not leave any items on the stairs.  Make sure that there are handrails on both sides of the stairs and use them. Fix handrails that are broken or loose. Make sure that handrails are as long as the stairways.  Check any carpeting to make sure that it is firmly attached to the stairs. Fix any carpet that is loose or worn.  Avoid having throw rugs at the top or bottom of the stairs. If you do have throw rugs, attach them to the floor with carpet tape.  Make sure that you have a light switch at the top of the stairs and the bottom of the stairs. If you do not have them, ask someone to add them for you. What else can I do to help prevent falls?  Wear shoes that:  Do not have high heels.  Have rubber bottoms.  Are comfortable and fit you  well.  Are closed at the toe. Do not wear sandals.  If you use a stepladder:  Make sure that it is fully opened. Do not climb a closed stepladder.  Make sure that both sides of the stepladder are locked into place.  Ask someone to hold it for you, if possible.  Clearly mark and make sure that you can see:  Any grab bars or handrails.  First and last steps.  Where the edge of each step is.  Use tools that help you move around (mobility aids) if they are needed. These include:  Canes.  Walkers.  Scooters.  Crutches.  Turn on the lights when you go into a dark area. Replace any light bulbs as soon as they burn out.  Set up your furniture so you have a clear path. Avoid moving your furniture around.  If any of your floors are uneven, fix them.  If there are any pets around you, be aware of where they are.  Review your medicines with your doctor. Some medicines can make you feel dizzy. This can increase your chance of falling. Ask your doctor what other things that you can do to help prevent falls. This information is not intended to replace advice given to you by your health care provider. Make sure you discuss any questions you have with your health care provider. Document Released: 09/04/2009 Document Revised: 04/15/2016 Document Reviewed: 12/13/2014 Elsevier Interactive Patient Education  2017 [employer].

## 2017-03-23 NOTE — Progress Notes (Signed)
Subjective:   Joel Rogers is a 66 y.o. male who presents for an Initial Medicare Annual Wellness Visit.  Review of Systems: Cardiac Risk Factors include: advanced age (>4men, >37 women);diabetes mellitus;hypertension;male gender;dyslipidemia;obesity (BMI >30kg/m2);sedentary lifestyle    Objective:    Today's Vitals   03/23/17 1106 03/23/17 1110  BP: 120/82   Pulse: 60   Temp: 98.5 F (36.9 C)   TempSrc: Oral   SpO2: 95%   Weight: (!) 308 lb 1.9 oz (139.8 kg)   Height:  (1.854 m)   PainSc:  9    Body mass index is 40.65 kg/m.  Current Medications (verified) Outpatient Encounter Prescriptions as of 03/23/2017  Medication Sig  . aspirin EC 81 MG tablet Take 1 tablet (81 mg total) by mouth daily.  Marland Kitchen atorvastatin (LIPITOR) 80 MG tablet Take 1 tablet (80 mg total) by mouth daily with breakfast.  . Calcium Carbonate-Vitamin D (CALCIUM + D PO) Take 1 tablet by mouth daily.  . colchicine 0.6 MG tablet Take 1 tablet (0.6 mg total) by mouth 2 (two) times daily.  Marland Kitchen losartan (COZAAR) 50 MG tablet Take 1 tablet (50 mg total) by mouth daily.  . metoprolol (LOPRESSOR) 100 MG tablet Take 1 tablet (100 mg total) by mouth 2 (two) times daily. (Patient taking differently: Take 50 mg by mouth 2 (two) times daily. )  . tadalafil (CIALIS) 20 MG tablet Take 1 tablet (20 mg total) by mouth daily as needed for erectile dysfunction.  . Vitamin D, Ergocalciferol, (DRISDOL) 50000 units CAPS capsule Take 1 capsule (50,000 Units total) by mouth every 7 (seven) days.  . vitamin E 400 UNIT capsule Take 400 Units by mouth daily.  . [DISCONTINUED] tadalafil (CIALIS) 20 MG tablet Take 20 mg by mouth daily as needed for erectile dysfunction.  . [DISCONTINUED] losartan (COZAAR) 50 MG tablet Take 50 mg by mouth daily.  . [DISCONTINUED] metoprolol (LOPRESSOR) 100 MG tablet TAKE ONE TABLET BY MOUTH TWICE DAILY   No facility-administered encounter medications on file as of 03/23/2017.     Allergies  (verified) Patient has no known allergies.   History: Past Medical History:  Diagnosis Date  . Arthritis   . Atrial fibrillation (HCC)   . Diabetes mellitus without complication (HCC)    borderline  . Hyperlipidemia   . Hypertension   . Myocardial infarction (HCC)   . Normal coronary arteries    by cardiac catheterization performed 03/14/06  . Obesity   . Sleep apnea    on CPAP  . Venous insufficiency    Past Surgical History:  Procedure Laterality Date  . CRANIOTOMY Right 08/27/2016   Procedure: CRANIOTOMY HEMATOMA EVACUATION SUBDURAL;  Surgeon: Maeola Harman, MD;  Location[facility] OR;  Service: Neurosurgery;  Laterality: Right;  . GASTRIC BYPASS  09/2008  . JOINT REPLACEMENT  08/31/2006   right hip  . TOTAL HIP ARTHROPLASTY     right hip   Family History  Problem Relation Age of Onset  . Hypertension Father   . Cancer Father     prob prostate  . Alzheimer's disease Mother   . Alzheimer's disease Maternal Grandfather   . Breast cancer Sister   . Cancer Brother     "brain tumor"   Social History   Occupational History  . maint Curator     disability retirement   Social History Main Topics  . Smoking status: Never Smoker  . Smokeless tobacco: Never Used  . Alcohol use No  . Drug use: No  .  Sexual activity: Not Currently   Tobacco Counseling Counseling given: Not Answered   Activities of Daily Living In your present state of health, do you have any difficulty performing the following activities: 03/23/2017 11/03/2016  Hearing? N N  Vision? N N  Difficulty concentrating or making decisions? N N  Walking or climbing stairs? Y N  Dressing or bathing? N N  Doing errands, shopping? N N  Preparing Food and eating ? N -  Using the Toilet? N -  In the past six months, have you accidently leaked urine? N -  Do you have problems with loss of bowel control? N -  Managing your Medications? N -  Managing your Finances? N -  Housekeeping or managing your Housekeeping?  N -  Some recent data might be hidden    Immunizations and Health Maintenance Immunization History  Administered Date(s) Administered  . Influenza,inj,Quad PF,36+ Mos 08/29/2016  . Pneumococcal Polysaccharide-23 08/29/2016  . Tdap 02/21/2008   Health Maintenance Due  Topic Date Due  . Hepatitis C Screening  Nov 01, 1951    Patient Care Team: Eustace Moore, MD as PCP - General (Family Medicine) Jethro Bolus, MD as Consulting Physician (Ophthalmology) Ollen Gross, MD as Consulting Physician (Orthopedic Surgery) Runell Gess, MD as Consulting Physician (Cardiology)  Indicate any recent Medical Services you may have received from other than [facility] providers in the past year (date may be approximate).    Assessment:   This is a routine wellness examination for Joel Rogers.  Hearing/Vision screen  Visual Acuity Screening   Right eye Left eye Both eyes  Without correction:     With correction:    Dietary issues and exercise activities discussed: Current Exercise Habits: The patient does not participate in regular exercise at present  Goals    . Blood Pressure < 140/90    . Exercise 150 minutes per week (moderate activity)    . Weight (lb) < 275 lb (124.7 kg)      Depression Screen PHQ 2/9 Scores 03/23/2017 11/03/2016  PHQ - 2 Score 0 0    Fall Risk Fall Risk  03/23/2017 11/03/2016  Falls in the past year? No No    Cognitive Function:        Screening Tests Health Maintenance  Topic Date Due  . Hepatitis C Screening  1951-07-13  . INFLUENZA VACCINE  06/22/2017  . PNA vac Low Risk Adult (2 of 2 - PCV13) 08/29/2017  . TETANUS/TDAP  02/20/2018  . COLONOSCOPY  11/03/2018        Plan:     I have personally reviewed and noted the following in the patient's chart:   . Medical and social history . Use of alcohol, tobacco or illicit drugs  . Current medications and supplements . Functional ability and status . Nutritional status . Physical  activity . Advanced directives . List of other physicians . Hospitalizations, surgeries, and ER visits in previous 12 months . Vitals . Screenings to include cognitive, depression, and falls . Referrals and appointments  In addition, I have reviewed and discussed with patient certain preventive protocols, quality metrics, and best practice recommendations. A written personalized care plan for preventive services as well as general preventive health recommendations were provided to patient.     Candis Shine, LPN   11/27/1094

## 2017-03-23 NOTE — Telephone Encounter (Signed)
Dr. Delton See, During patients AWV today he stated he is having pain in his right thigh/groin area. He states this pain has been present for about a year and he has had multiple tests run by Dr. Despina Hick and everything has come back fine each time. He describes the pain as a constant throbbing that is worse when going from sitting to standing or when lifting anything over 10 lbs. When he gets in the pool he states the cool water will help relieve the pain slightly. Please advise.

## 2017-03-24 ENCOUNTER — Encounter: Payer: Self-pay | Admitting: Family Medicine

## 2017-03-24 LAB — HEPATITIS C ANTIBODY: HCV AB: NEGATIVE

## 2017-03-24 NOTE — Telephone Encounter (Signed)
This is not an uncommon problem with obese patients.  It is pinching of the femoral nerve and is called meralgia paresthetica.  It is treated with gabapentin.  I can discuss/treat with Joel Rogers at next appt.

## 2017-05-24 ENCOUNTER — Other Ambulatory Visit: Payer: Self-pay | Admitting: Cardiovascular Disease

## 2017-05-24 ENCOUNTER — Other Ambulatory Visit: Payer: Self-pay | Admitting: Family Medicine

## 2017-06-01 ENCOUNTER — Encounter: Payer: Self-pay | Admitting: Family Medicine

## 2017-06-01 ENCOUNTER — Ambulatory Visit (INDEPENDENT_AMBULATORY_CARE_PROVIDER_SITE_OTHER): Payer: Medicare Other | Admitting: Family Medicine

## 2017-06-01 VITALS — BP 134/80 | HR 56 | Temp 96.3°F | Resp 18 | Ht 73.0 in | Wt 306.0 lb

## 2017-06-01 DIAGNOSIS — G8929 Other chronic pain: Secondary | ICD-10-CM

## 2017-06-01 DIAGNOSIS — I482 Chronic atrial fibrillation, unspecified: Secondary | ICD-10-CM

## 2017-06-01 DIAGNOSIS — M25551 Pain in right hip: Secondary | ICD-10-CM | POA: Diagnosis not present

## 2017-06-01 DIAGNOSIS — Z9884 Bariatric surgery status: Secondary | ICD-10-CM

## 2017-06-01 DIAGNOSIS — E7800 Pure hypercholesterolemia, unspecified: Secondary | ICD-10-CM | POA: Diagnosis not present

## 2017-06-01 DIAGNOSIS — I10 Essential (primary) hypertension: Secondary | ICD-10-CM

## 2017-06-01 MED ORDER — IBUPROFEN 800 MG PO TABS: 800.0000 mg | ORAL_TABLET | Freq: Three times a day (TID) | ORAL | 3 refills | Status: DC | PRN

## 2017-06-01 NOTE — Patient Instructions (Signed)
Need to see Dr Romeo AppleHarrison for opinion  Need blood work in October See me after  Call sooner for problems

## 2017-06-01 NOTE — Progress Notes (Signed)
Chief Complaint  Patient presents with  . Follow-up    6 month   Weight is up Not exercising disappointed at ongoing worsening hip pain.  He had a hip replacement in 2007.  Pain to some degree ever since.  Now can hardly get out.  Cannot exercise.  Fears falling.  Refuses to use cane or walker.  Has been back to his ortho who told him the x rays looked fine.  No pain at rest.  Worse with weight bearing.  Trouble with stairs.  Pain goes from groin to mid thing.  No falls.  Muscles R leg getting weaker from disuse. BP good,  No problems with medicine.  No heart symptoms or palpitations.  NO chest pain or dyspnea. He is taking his vitamin D Sees cardiology yearly Patient Active Problem List   Diagnosis Date Noted  . Vitamin D deficiency 12/02/2016  . Family history of prostate cancer in father 11/03/2016  . Status post gastric bypass for obesity 11/02/2016  . Subdural hematoma (HCC) 08/26/2016  . Syncope 07/13/2016  . HTN (hypertension) 04/13/2013  . Hyperlipemia 04/13/2013  . Atrial fibrillation (HCC) 02/14/2013  . Long term (current) use of anticoagulants 02/14/2013  . MYOCARDIAL INFARCTION 06/25/2008  . EXOGENOUS OBESITY 06/18/2008  . Obstructive sleep apnea 06/18/2008    Outpatient Encounter Prescriptions as of 06/01/2017  Medication Sig  . aspirin EC 81 MG tablet Take 1 tablet (81 mg total) by mouth daily.  Marland Kitchen. atorvastatin (LIPITOR) 80 MG tablet TAKE ONE TABLET BY MOUTH ONCE DAILY WITH BREAKFAST  . Calcium Carbonate-Vitamin D (CALCIUM + D PO) Take 1 tablet by mouth daily.  . colchicine 0.6 MG tablet Take 1 tablet (0.6 mg total) by mouth 2 (two) times daily.  Marland Kitchen. losartan (COZAAR) 50 MG tablet Take 1 tablet (50 mg total) by mouth daily.  . metoprolol (LOPRESSOR) 100 MG tablet Take 1 tablet (100 mg total) by mouth 2 (two) times daily. (Patient taking differently: Take 50 mg by mouth 2 (two) times daily. )  . tadalafil (CIALIS) 20 MG tablet Take 1 tablet (20 mg total) by mouth  daily as needed for erectile dysfunction.  . Vitamin D, Ergocalciferol, (DRISDOL) 50000 units CAPS capsule TAKE ONE CAPSULE BY MOUTH EVERY 7 DAYS  . vitamin E 400 UNIT capsule Take 400 Units by mouth daily.  Marland Kitchen. ibuprofen (ADVIL,MOTRIN) 800 MG tablet Take 1 tablet (800 mg total) by mouth every 8 (eight) hours as needed for moderate pain.   No facility-administered encounter medications on file as of 06/01/2017.     No Known Allergies  Review of Systems  Constitutional: Negative for fatigue and unexpected weight change.  HENT: Negative.  Negative for congestion and dental problem.   Eyes: Negative.  Negative for photophobia and visual disturbance.  Respiratory: Negative for cough and shortness of breath.   Cardiovascular: Negative for chest pain, palpitations and leg swelling.  Gastrointestinal: Negative for constipation and diarrhea.  Genitourinary: Negative for difficulty urinating and frequency.  Musculoskeletal: Positive for arthralgias and gait problem.       Chronic pain right hip after arthroplasty  Neurological: Negative for dizziness and headaches.  Psychiatric/Behavioral: Negative.  Negative for sleep disturbance.       Compliant with CPAP.  Discouraged.    BP 134/80 (BP Location: Right Arm, Patient Position: Sitting, Cuff Size: Large)   Pulse (!) 56   Temp (!) 96.3 F (35.7 C) (Temporal)   Resp 18   Ht 6\' 1"  (1.854 m)  Wt (!) 306 lb 0.6 oz (138.8 kg)   SpO2 96%   BMI 40.38 kg/m   Physical Exam  Constitutional: He is oriented to person, place, and time. He appears well-developed and well-nourished.  Large abdominal pannus  HENT:  Head: Normocephalic and atraumatic.  Mouth/Throat: Oropharynx is clear and moist.  Eyes: Conjunctivae are normal. Pupils are equal, round, and reactive to light.  Neck: Normal range of motion. Neck supple.  Cardiovascular: Normal rate.   Murmur heard. irreg  Pulmonary/Chest: Effort normal and breath sounds normal. No respiratory  distress.  Abdominal: Soft. Bowel sounds are normal.  Musculoskeletal: He exhibits edema and tenderness.  Right hip with limited ROM.  R quad atrophy  Lymphadenopathy:    He has no cervical adenopathy.  Neurological: He is alert and oriented to person, place, and time.  Very antalgic gait  Psychiatric: He has a normal mood and affect.    ASSESSMENT/PLAN:  1. Status post gastric bypass for obesity  2. Chronic atrial fibrillation (HCC)  - CBC - COMPLETE METABOLIC PANEL WITH GFR - Lipid panel - VITAMIN D 25 Hydroxy (Vit-D Deficiency, Fractures) - Urinalysis, Routine w reflex microscopic  3. Chronic hip pain, right - Ambulatory referral to Orthopedic Surgery  4. Essential hypertension  5. Pure hypercholesterolemia    Patient Instructions  Need to see Dr Romeo Apple for opinion  Need blood work in October See me after  Call sooner for problems   Eustace Moore, MD

## 2017-06-09 ENCOUNTER — Telehealth: Payer: Self-pay | Admitting: Orthopedic Surgery

## 2017-06-09 NOTE — Telephone Encounter (Signed)
Patient has been referred to our office for evaluation of chronic right hip pain, per primary care, Dr Myrene BuddyYvonne Lily PeerSue Nelson, [facility].  Patient was last seen by Dr Romeo AppleHarrison 07/04/2006, and was referred to [email or web address] for right hip; surgery was done there at that time. Patient states he is still seeing Dr Lequita HaltAluisio; relays he's been prescribed physical therapy, however, that has not helped much. Patient said Dr Delton SeeNelson requests our providers to evaluate. Relayed to patient this would be 2nd opinion, and therefore need to be reviewed.* *Please advise, prior to patient requesting all records (per [facility] chart [facility]), if this may be possible to schedule. Patient aware he would need notes, films, operative notes.

## 2017-06-09 NOTE — Telephone Encounter (Signed)
Ok let me review the records and I ll see if I can help him

## 2017-06-09 NOTE — Telephone Encounter (Signed)
Called back to patient; will request records and film for Dr Romeo AppleHarrison to review.

## 2017-06-13 ENCOUNTER — Telehealth: Payer: Self-pay | Admitting: Family Medicine

## 2017-06-13 NOTE — Telephone Encounter (Signed)
Out of torsemide 20mg . (2 tablets once daily)  [facility]

## 2017-06-14 DIAGNOSIS — G4733 Obstructive sleep apnea (adult) (pediatric): Secondary | ICD-10-CM | POA: Diagnosis not present

## 2017-06-15 ENCOUNTER — Other Ambulatory Visit: Payer: Self-pay | Admitting: Family Medicine

## 2017-06-15 MED ORDER — TORSEMIDE 20 MG PO TABS: 20.0000 mg | ORAL_TABLET | Freq: Every day | ORAL | 3 refills | Status: DC

## 2017-07-06 ENCOUNTER — Telehealth: Payer: Self-pay | Admitting: *Deleted

## 2017-07-06 NOTE — Telephone Encounter (Signed)
Noted.  He should go back to surgeon who did hip surgery

## 2017-07-06 NOTE — Telephone Encounter (Signed)
Just an FYI from the referral note from [facility].   Per Dr Mort SawyersHarrison's review and response, called patient just before lunch today, 07/05/17; left voice msg to please return call.  Patient responded, 2:58p.m today - relayed that Dr Romeo AppleHarrison is unable to offer appointment for this medical issue, as our clinic does not treat, however was happy to review and advise regarding this 2nd opinion request.  Patient voiced understanding, and is aware that we will notify referring provider, Dr Delton SeeNelson, to further advise patient.   Patient states he may also call back to his current/[facility], regarding scheduling.

## 2017-07-13 ENCOUNTER — Encounter: Payer: Self-pay | Admitting: Cardiovascular Disease

## 2017-07-13 ENCOUNTER — Ambulatory Visit (INDEPENDENT_AMBULATORY_CARE_PROVIDER_SITE_OTHER): Payer: Medicare Other | Admitting: Cardiovascular Disease

## 2017-07-13 VITALS — BP 150/88 | HR 58 | Ht 71.5 in | Wt 315.6 lb

## 2017-07-13 DIAGNOSIS — E7800 Pure hypercholesterolemia, unspecified: Secondary | ICD-10-CM

## 2017-07-13 DIAGNOSIS — I482 Chronic atrial fibrillation, unspecified: Secondary | ICD-10-CM

## 2017-07-13 NOTE — Assessment & Plan Note (Signed)
History of hyperlipidemia on statin therapy with lipid profile performed 11/26/16 revealing total cholesterol 144, LDL 74 and HDL of 61.

## 2017-07-13 NOTE — Assessment & Plan Note (Signed)
History of essential hypertension blood pressure measured at 150/88. He is on losartan and metoprolol. Continue current meds at current dosing

## 2017-07-13 NOTE — Patient Instructions (Signed)
Medication Instructions: Your physician recommends that you continue on your current medications as directed. Please refer to the Current Medication list given to you today.  Testing/Procedures: Your physician has requested that you have an echocardiogram. Echocardiography is a painless test that uses sound waves to create images of your heart. It provides your doctor with information about the size and shape of your heart and how well your heart's chambers and valves are working. This procedure takes approximately one hour. There are no restrictions for this procedure.  Follow-Up: Your physician wants you to follow-up in: 1 year with Dr. Berry. You will receive a reminder letter in the mail two months in advance. If you don't receive a letter, please call our office to schedule the follow-up appointment.  If you need a refill on your cardiac medications before your next appointment, please call your pharmacy.  

## 2017-07-13 NOTE — Assessment & Plan Note (Signed)
History of chronic atrial fibrillation rate controlled currently not on oral anti-regulation because of prior subdural hematoma.

## 2017-07-13 NOTE — Assessment & Plan Note (Signed)
History of obstructive sleep apnea on CPAP which she benefits from 

## 2017-07-13 NOTE — Progress Notes (Signed)
07/13/2017 Joel Rogers   April 23, 1951  [id number]  Primary Physician Eustace Moore, MD Primary Cardiologist: Runell Gess MD Milagros Loll, [place], [place]  HPI:  Joel Rogers is a 66 y.o. male severely overweight, married, African American male, father of 2, grandfather to 4 grandchildren who I last saw 11/24/16. He has a history of chronic atrial fibrillation, rate controlled on Coumadin anticoagulation. He has obstructive sleep apnea on CPAP, hypertension, and hyperlipidemia. He was catheterized by Dr. Lavonne Chick in 2007 and found to have normal coronary arteries and normal coronary function. He had laparoscopic Roux-en-Y gastric bypass surgery in November of 2009 at [facility] and has lost over 140 pounds. He feels clinically improved. His last lipid profile performed a year ago was excellent with an LDL of 82, HDL of 52, total cholesterol 142. He complains of right greater than left lower extremity swelling, which has been chronic. He has some venous reflux in his lesser saphenous vein, probably not amenable to endovenous ablation. Since I saw him 9 months ago he's remained stable. He recently had an episode of syncope in the morning while getting out of bed without other symptoms. He was taken to [facility] emergency room where his workup was unrevealing. He did apparently hit his head and soft with developed a subdural hematoma followed by Dr. Venetia Maxon. This has improved over time. Oral anticoagulation was discontinued as a result of this. Since I saw him in January of this year he remained clinically stable and is otherwise asymptomatic.   Current Meds  Medication Sig  . aspirin EC 81 MG tablet Take 1 tablet (81 mg total) by mouth daily.  Marland Kitchen atorvastatin (LIPITOR) 80 MG tablet TAKE ONE TABLET BY MOUTH ONCE DAILY WITH BREAKFAST  . Calcium Carbonate-Vitamin D (CALCIUM + D PO) Take 1 tablet by mouth daily.  . colchicine 0.6 MG tablet Take 1 tablet (0.6 mg total) by mouth 2 (two) times  daily.  Marland Kitchen ibuprofen (ADVIL,MOTRIN) 800 MG tablet Take 1 tablet (800 mg total) by mouth every 8 (eight) hours as needed for moderate pain.  Marland Kitchen losartan (COZAAR) 50 MG tablet Take 1 tablet (50 mg total) by mouth daily.  . metoprolol (LOPRESSOR) 100 MG tablet Take 1 tablet (100 mg total) by mouth 2 (two) times daily. (Patient taking differently: Take 50 mg by mouth 2 (two) times daily. )  . tadalafil (CIALIS) 20 MG tablet Take 1 tablet (20 mg total) by mouth daily as needed for erectile dysfunction.  . torsemide (DEMADEX) 20 MG tablet Take 1 tablet (20 mg total) by mouth daily.  . Vitamin D, Ergocalciferol, (DRISDOL) 50000 units CAPS capsule TAKE ONE CAPSULE BY MOUTH EVERY 7 DAYS  . vitamin E 400 UNIT capsule Take 400 Units by mouth daily.     No Known Allergies  Social History   Social History  . Marital status: Married    Spouse name: [name]  . Number of children: 2  . Years of education: 16   Occupational History  . maint Curator     disability retirement   Social History Main Topics  . Smoking status: Never Smoker  . Smokeless tobacco: Never Used  . Alcohol use No  . Drug use: No  . Sexual activity: Not Currently   Other Topics Concern  . Not on file   Social History Narrative   Doctorate in ministry   Retired Data processing manager   Disability from hip replacement   Lives with wife [name]  [facility] [employer] [facility]     Review of Systems: General: negative for chills, fever, night sweats or weight changes.  Cardiovascular: negative for chest pain, dyspnea on exertion, edema, orthopnea, palpitations, paroxysmal nocturnal dyspnea or shortness of breath Dermatological: negative for rash Respiratory: negative for cough or wheezing Urologic: negative for hematuria Abdominal: negative for nausea, vomiting, diarrhea, bright red blood per rectum, melena, or hematemesis Neurologic: negative for visual changes, syncope, or dizziness All other systems  reviewed and are otherwise negative except as noted above.    Blood pressure (!) 150/88, pulse (!) 58, height 5' 11.5" (1.816 m), weight (!) 315 lb 9.6 oz (143.2 kg).  General appearance: alert and no distress Neck: no adenopathy, no JVD, supple, symmetrical, trachea midline, thyroid not enlarged, symmetric, no tenderness/mass/nodules and Soft left carotid bruit Lungs: clear to auscultation bilaterally Heart: Irregularly irregular with a 2/6 systolic ejection murmur at the base. Extremities: extremities normal, atraumatic, no cyanosis or edema  EKG atrial fibrillation with a ventricular response of 58, right bundle branch block with lateral T-wave inversion more pronounced compared to prior EKGs. I personally reviewed this EKG.  ASSESSMENT AND PLAN:   Obstructive sleep apnea History of obstructive sleep apnea on CPAP which she benefits from  Atrial fibrillation History of chronic atrial fibrillation rate controlled currently not on oral anti-regulation because of prior subdural hematoma.  HTN (hypertension) History of essential hypertension blood pressure measured at 150/88. He is on losartan and metoprolol. Continue current meds at current dosing  Hyperlipemia History of hyperlipidemia on statin therapy with lipid profile performed 11/26/16 revealing total cholesterol 144, LDL 74 and HDL of 61.      Runell Gess MD FACP,FACC,FAHA, [facility] 07/13/2017 10:52 AM

## 2017-07-19 ENCOUNTER — Ambulatory Visit (HOSPITAL_COMMUNITY): Payer: Medicare Other | Attending: Cardiology

## 2017-07-19 ENCOUNTER — Other Ambulatory Visit: Payer: Self-pay

## 2017-07-19 DIAGNOSIS — I10 Essential (primary) hypertension: Secondary | ICD-10-CM | POA: Diagnosis not present

## 2017-07-19 DIAGNOSIS — I350 Nonrheumatic aortic (valve) stenosis: Secondary | ICD-10-CM | POA: Insufficient documentation

## 2017-07-19 DIAGNOSIS — I482 Chronic atrial fibrillation, unspecified: Secondary | ICD-10-CM

## 2017-07-19 DIAGNOSIS — Z6841 Body Mass Index (BMI) 40.0 and over, adult: Secondary | ICD-10-CM | POA: Insufficient documentation

## 2017-07-19 DIAGNOSIS — E785 Hyperlipidemia, unspecified: Secondary | ICD-10-CM | POA: Insufficient documentation

## 2017-07-29 ENCOUNTER — Ambulatory Visit (HOSPITAL_COMMUNITY)
Admission: RE | Admit: 2017-07-29 | Discharge: 2017-07-29 | Disposition: A | Discharge status: Home / Self Care | Payer: Medicare Other | Source: Ambulatory Visit | Attending: Family Medicine | Admitting: Family Medicine

## 2017-07-29 ENCOUNTER — Encounter: Payer: Self-pay | Admitting: Family Medicine

## 2017-07-29 ENCOUNTER — Ambulatory Visit (INDEPENDENT_AMBULATORY_CARE_PROVIDER_SITE_OTHER): Payer: Medicare Other | Admitting: Family Medicine

## 2017-07-29 VITALS — BP 128/90 | HR 64 | Temp 97.3°F | Resp 18 | Ht 71.5 in | Wt 324.1 lb

## 2017-07-29 DIAGNOSIS — R0602 Shortness of breath: Secondary | ICD-10-CM | POA: Diagnosis not present

## 2017-07-29 NOTE — Patient Instructions (Signed)
I will call with x ray result  Please call me if not improving by next week

## 2017-07-29 NOTE — Progress Notes (Signed)
Chief Complaint  Patient presents with  . Shortness of Breath    x 1 week   Acute visit Feels more short of breath for the last week.  Started after mowing the grass.  No cough or congestion.  Left side of chest feels tight.  Worse with heat and activity.  Better with AC and rest.  No pain.  No fever or chills or malaise. No history allergies or asthma.  Non smoker.  No COPD or emphysema.  Recent cardiology eval on 8/22 was good.  Is scheduled for an echocardiogram. No change in pedal edema.  No change in diet or medicines. Is slowly gaining weight.  Has gone from 290 to 325 this year.  this is discussed.  Patient Active Problem List   Diagnosis Date Noted  . Vitamin D deficiency 12/02/2016  . Family history of prostate cancer in father 11/03/2016  . Status post gastric bypass for obesity 11/02/2016  . Subdural hematoma (HCC) 08/26/2016  . Syncope 07/13/2016  . HTN (hypertension) 04/13/2013  . Hyperlipemia 04/13/2013  . Atrial fibrillation (HCC) 02/14/2013  . Acute myocardial infarction (HCC) 06/25/2008  . EXOGENOUS OBESITY 06/18/2008  . Obstructive sleep apnea 06/18/2008    Outpatient Encounter Prescriptions as of 07/29/2017  Medication Sig  . aspirin EC 81 MG tablet Take 1 tablet (81 mg total) by mouth daily.  Marland Kitchen. atorvastatin (LIPITOR) 80 MG tablet TAKE ONE TABLET BY MOUTH ONCE DAILY WITH BREAKFAST  . Calcium Carbonate-Vitamin D (CALCIUM + D PO) Take 1 tablet by mouth daily.  . colchicine 0.6 MG tablet Take 1 tablet (0.6 mg total) by mouth 2 (two) times daily.  Marland Kitchen. ibuprofen (ADVIL,MOTRIN) 800 MG tablet Take 1 tablet (800 mg total) by mouth every 8 (eight) hours as needed for moderate pain.  Marland Kitchen. losartan (COZAAR) 50 MG tablet Take 1 tablet (50 mg total) by mouth daily.  . metoprolol (LOPRESSOR) 100 MG tablet Take 1 tablet (100 mg total) by mouth 2 (two) times daily. (Patient taking differently: Take 50 mg by mouth 2 (two) times daily. )  . tadalafil (CIALIS) 20 MG tablet Take 1  tablet (20 mg total) by mouth daily as needed for erectile dysfunction.  . torsemide (DEMADEX) 20 MG tablet Take 1 tablet (20 mg total) by mouth daily.  . Vitamin D, Ergocalciferol, (DRISDOL) 50000 units CAPS capsule TAKE ONE CAPSULE BY MOUTH EVERY 7 DAYS  . vitamin E 400 UNIT capsule Take 400 Units by mouth daily.   No facility-administered encounter medications on file as of 07/29/2017.     No Known Allergies  Review of Systems  Constitutional: Positive for unexpected weight change. Negative for activity change, appetite change, diaphoresis and fatigue.  HENT: Negative for congestion, dental problem, postnasal drip, rhinorrhea, sinus pressure and sneezing.   Eyes: Negative for photophobia and visual disturbance.  Respiratory: Positive for chest tightness and shortness of breath. Negative for cough and wheezing.   Cardiovascular: Negative for chest pain, palpitations and leg swelling.  Gastrointestinal: Negative for constipation and diarrhea.  Genitourinary: Negative for difficulty urinating and frequency.  Musculoskeletal: Positive for arthralgias.  Neurological: Negative for dizziness and headaches.    BP 128/90   Pulse 64   Temp (!) 97.3 F (36.3 C) (Temporal)   Resp 18   Ht 5' 11.5" (1.816 m)   Wt (!) 324 lb 1.9 oz (147 kg)   SpO2 96%   BMI 44.58 kg/m   Physical Exam  Constitutional: He is oriented to person, place, and time.  He appears well-developed and well-nourished. No distress.  HENT:  Head: Normocephalic and atraumatic.  Right Ear: External ear normal.  Left Ear: External ear normal.  Nose: Nose normal.  Mouth/Throat: Oropharynx is clear and moist. No oropharyngeal exudate.  Eyes: Pupils are equal, round, and reactive to light. Conjunctivae are normal.  Neck: Normal range of motion. No JVD present.  Cardiovascular: Normal rate, regular rhythm and normal heart sounds.   Pulmonary/Chest: Effort normal and breath sounds normal. No respiratory distress. He has no  wheezes. He has no rales. He exhibits no tenderness.  Musculoskeletal: He exhibits edema.  Neurological: He is alert and oriented to person, place, and time.  Psychiatric: He has a normal mood and affect. His behavior is normal. Thought content normal.    ASSESSMENT/PLAN:  1. SOB (shortness of breath) Discussed that I do not hear any abnormality , he is not fluid overloaded, do not feel he has an infection.  Does not sound cardiac.  Echo is pending.  Will get a CXR now and follow. - DG Chest 2 View; Future   Patient Instructions  I will call with x ray result  Please call me if not improving by next week  ADDENDUM: CXR showed CHF Echo from 8/28 reviewed.  severe aortic stenosis.   Called patient - he needs to increase his diuretic,  and follow up with cardiology GO TO ER IF WORSE or develops chest pain  Eustace Moore, MD

## 2017-08-09 ENCOUNTER — Ambulatory Visit (INDEPENDENT_AMBULATORY_CARE_PROVIDER_SITE_OTHER): Payer: Medicare Other | Admitting: Cardiovascular Disease

## 2017-08-09 ENCOUNTER — Encounter: Payer: Self-pay | Admitting: Cardiovascular Disease

## 2017-08-09 VITALS — BP 125/90 | HR 60 | Ht 72.0 in | Wt 309.0 lb

## 2017-08-09 DIAGNOSIS — I350 Nonrheumatic aortic (valve) stenosis: Secondary | ICD-10-CM | POA: Diagnosis not present

## 2017-08-09 DIAGNOSIS — I10 Essential (primary) hypertension: Secondary | ICD-10-CM | POA: Diagnosis not present

## 2017-08-09 DIAGNOSIS — I482 Chronic atrial fibrillation, unspecified: Secondary | ICD-10-CM

## 2017-08-09 NOTE — Assessment & Plan Note (Signed)
Kruze returns today to follow his 2-D echo which was recently performed on 07/19/17. This showed normal LV function and aortic valve area of 0.74 cm and peak gradient of 71. He briefly had an episode of shortness of breath thought to be iron overload responding well to addition of oral diuretics. He's lost about 15 pounds. We will recheck a 2-D echo in 6 months. We talked about the probability of needing aortic valve replacement sometime in the next 6-18 months.

## 2017-08-09 NOTE — Patient Instructions (Signed)
Medication Instructions: Your physician recommends that you continue on your current medications as directed. Please refer to the Current Medication list given to you today.  Testing/Procedures: Your physician has requested that you have an echocardiogram in 6 months--prior to follow-up. Echocardiography is a painless test that uses sound waves to create images of your heart. It provides your doctor with information about the size and shape of your heart and how well your heart's chambers and valves are working. This procedure takes approximately one hour. There are no restrictions for this procedure.  Follow-Up: Your physician wants you to follow-up in: 6 months with Dr. Allyson Sabal. You will receive a reminder letter in the mail two months in advance. If you don't receive a letter, please call our office to schedule the follow-up appointment.  If you need a refill on your cardiac medications before your next appointment, please call your pharmacy.

## 2017-08-09 NOTE — Progress Notes (Signed)
Joel Rogers returns today to follow his 2-D echo which was recently performed on 07/19/17. This showed normal LV function and aortic valve area of 0.74 cm and peak gradient of 71. He briefly had an episode of shortness of breath thought to be iron overload responding well to addition of oral diuretics. He's lost about 15 pounds. We will recheck a 2-D echo in 6 months. We talked about the probability of needing aortic valve replacement sometime in the next 6-18 months. 

## 2017-08-10 ENCOUNTER — Ambulatory Visit: Payer: Medicare Other | Admitting: Cardiovascular Disease

## 2017-09-09 DIAGNOSIS — E559 Vitamin D deficiency, unspecified: Secondary | ICD-10-CM | POA: Diagnosis not present

## 2017-09-09 DIAGNOSIS — Z9884 Bariatric surgery status: Secondary | ICD-10-CM | POA: Diagnosis not present

## 2017-09-09 DIAGNOSIS — I10 Essential (primary) hypertension: Secondary | ICD-10-CM | POA: Diagnosis not present

## 2017-09-09 DIAGNOSIS — I482 Chronic atrial fibrillation: Secondary | ICD-10-CM | POA: Diagnosis not present

## 2017-09-09 DIAGNOSIS — E7800 Pure hypercholesterolemia, unspecified: Secondary | ICD-10-CM | POA: Diagnosis not present

## 2017-09-10 LAB — CBC
HCT: 38.2 % — ABNORMAL LOW (ref 38.5–50.0)
Hemoglobin: 12.6 g/dL — ABNORMAL LOW (ref 13.2–17.1)
MCH: 27.2 pg (ref 27.0–33.0)
MCHC: 33 g/dL (ref 32.0–36.0)
MCV: 82.3 fL (ref 80.0–100.0)
MPV: 10.8 fL (ref 7.5–12.5)
PLATELETS: 176 10*3/uL (ref 140–400)
RBC: 4.64 10*6/uL (ref 4.20–5.80)
RDW: 14.9 % (ref 11.0–15.0)
WBC: 6.4 10*3/uL (ref 3.8–10.8)

## 2017-09-10 LAB — URINALYSIS, ROUTINE W REFLEX MICROSCOPIC
Bacteria, UA: NONE SEEN /HPF
Bilirubin Urine: NEGATIVE
GLUCOSE, UA: NEGATIVE
HGB URINE DIPSTICK: NEGATIVE
KETONES UR: NEGATIVE
LEUKOCYTES UA: NEGATIVE
NITRITE: NEGATIVE
Specific Gravity, Urine: 1.019 (ref 1.001–1.03)

## 2017-09-10 LAB — COMPLETE METABOLIC PANEL WITH GFR
AG Ratio: 1.4 (calc) (ref 1.0–2.5)
ALKALINE PHOSPHATASE (APISO): 74 U/L (ref 40–115)
ALT: 17 U/L (ref 9–46)
AST: 30 U/L (ref 10–35)
Albumin: 3.8 g/dL (ref 3.6–5.1)
BILIRUBIN TOTAL: 1.1 mg/dL (ref 0.2–1.2)
BUN: 21 mg/dL (ref 7–25)
CHLORIDE: 103 mmol/L (ref 98–110)
CO2: 29 mmol/L (ref 20–32)
CREATININE: 1.24 mg/dL (ref 0.70–1.25)
Calcium: 8.9 mg/dL (ref 8.6–10.3)
GFR, Est African American: 70 mL/min/{1.73_m2} (ref >=60)
GFR, Est Non African American: 60 mL/min/{1.73_m2} (ref >=60)
GLUCOSE: 96 mg/dL (ref 65–99)
Globulin: 2.8 g/dL (calc) (ref 1.9–3.7)
Potassium: 3.5 mmol/L (ref 3.5–5.3)
Sodium: 145 mmol/L (ref 135–146)
Total Protein: 6.6 g/dL (ref 6.1–8.1)

## 2017-09-10 LAB — LIPID PANEL
CHOLESTEROL: 139 mg/dL (ref <200)
HDL: 53 mg/dL (ref >40)
LDL CHOLESTEROL (CALC): 74 mg/dL
Non-HDL Cholesterol (Calc): 86 mg/dL (calc) (ref <130)
Total CHOL/HDL Ratio: 2.6 (calc) (ref <5.0)
Triglycerides: 43 mg/dL (ref <150)

## 2017-09-10 LAB — VITAMIN D 25 HYDROXY (VIT D DEFICIENCY, FRACTURES): VIT D 25 HYDROXY: 39 ng/mL (ref 30–100)

## 2017-09-12 ENCOUNTER — Encounter: Payer: Self-pay | Admitting: Family Medicine

## 2017-09-26 ENCOUNTER — Ambulatory Visit: Payer: Medicare Other

## 2017-09-28 ENCOUNTER — Other Ambulatory Visit: Payer: Self-pay

## 2017-09-28 ENCOUNTER — Encounter: Payer: Self-pay | Admitting: Family Medicine

## 2017-09-28 ENCOUNTER — Ambulatory Visit (INDEPENDENT_AMBULATORY_CARE_PROVIDER_SITE_OTHER): Payer: Medicare Other | Admitting: Family Medicine

## 2017-09-28 VITALS — BP 130/86 | HR 80 | Temp 96.7°F | Resp 18 | Ht 72.0 in | Wt 316.1 lb

## 2017-09-28 DIAGNOSIS — I482 Chronic atrial fibrillation, unspecified: Secondary | ICD-10-CM

## 2017-09-28 DIAGNOSIS — I350 Nonrheumatic aortic (valve) stenosis: Secondary | ICD-10-CM

## 2017-09-28 DIAGNOSIS — Z23 Encounter for immunization: Secondary | ICD-10-CM | POA: Diagnosis not present

## 2017-09-28 DIAGNOSIS — I10 Essential (primary) hypertension: Secondary | ICD-10-CM

## 2017-09-28 DIAGNOSIS — Z9884 Bariatric surgery status: Secondary | ICD-10-CM

## 2017-09-28 NOTE — Patient Instructions (Addendum)
Pneumonia shot today Exercise every day that you are able Reduce the portions and sweets  Need blood work and a check up in six months Call sooner for problems

## 2017-09-28 NOTE — Progress Notes (Signed)
Chief Complaint  Patient presents with  . Follow-up    6 month   Patient is here for routine follow-up. When I last saw him he had shortness of breath from fluid overload.  He has aortic valvular disease.  He responded to an increase in his diuretic.  He went to Dr. Allyson SabalBerry in follow-up.  He is going to need an aortic valve replacement in the next 6-18 months.  He occasionally does take external Lasix when he feels fluid overloaded.  He is not having any chest pain.  He does have dyspnea with exertion. We discussed that when I last saw him he weighed 324 pounds.  Today he weighs 316.  I encouraged him to lose weight.  He has had gastric surgery, and did lose a lot of weight, but has a ways to go before he would be considered healthy. His blood pressure is well controlled. He had blood work done last month.  Everything was normal as expected. He refuses a flu shot.  I discussed with him why I think this is important.  He states that last time he had a flu shot he was "sick the whole winter".  I told him that I feel like people rarely gets sick from flu shots, and he is better off with than without them.  He agrees to a [place] today.  Patient Active Problem List   Diagnosis Date Noted  . Severe aortic stenosis 08/09/2017  . Vitamin D deficiency 12/02/2016  . Family history of prostate cancer in father 11/03/2016  . Status post gastric bypass for obesity 11/02/2016  . Subdural hematoma (HCC) 08/26/2016  . Syncope 07/13/2016  . HTN (hypertension) 04/13/2013  . Hyperlipemia 04/13/2013  . Atrial fibrillation (HCC) 02/14/2013  . Acute myocardial infarction (HCC) 06/25/2008  . EXOGENOUS OBESITY 06/18/2008  . Obstructive sleep apnea 06/18/2008    Outpatient Encounter Medications as of 09/28/2017  Medication Sig  . aspirin EC 81 MG tablet Take 1 tablet (81 mg total) by mouth daily.  Marland Kitchen. atorvastatin (LIPITOR) 80 MG tablet TAKE ONE TABLET BY MOUTH ONCE DAILY WITH BREAKFAST  . Calcium  Carbonate-Vitamin D (CALCIUM + D PO) Take 1 tablet by mouth daily.  . colchicine 0.6 MG tablet Take 1 tablet (0.6 mg total) by mouth 2 (two) times daily.  Marland Kitchen. ibuprofen (ADVIL,MOTRIN) 800 MG tablet Take 1 tablet (800 mg total) by mouth every 8 (eight) hours as needed for moderate pain.  Marland Kitchen. losartan (COZAAR) 50 MG tablet Take 1 tablet (50 mg total) by mouth daily.  . metoprolol (LOPRESSOR) 100 MG tablet Take 1 tablet (100 mg total) by mouth 2 (two) times daily. (Patient taking differently: Take 50 mg by mouth 2 (two) times daily. )  . tadalafil (CIALIS) 20 MG tablet Take 1 tablet (20 mg total) by mouth daily as needed for erectile dysfunction.  . torsemide (DEMADEX) 20 MG tablet Take 1 tablet (20 mg total) by mouth daily.  . Vitamin D, Ergocalciferol, (DRISDOL) 50000 units CAPS capsule TAKE ONE CAPSULE BY MOUTH EVERY 7 DAYS  . [DISCONTINUED] vitamin E 400 UNIT capsule Take 400 Units by mouth daily.   No facility-administered encounter medications on file as of 09/28/2017.     No Known Allergies  Review of Systems  Constitutional: Positive for unexpected weight change. Negative for activity change, appetite change, diaphoresis and fatigue.  HENT: Negative for congestion, dental problem, postnasal drip, rhinorrhea, sinus pressure and sneezing.   Eyes: Negative for photophobia and visual disturbance.  Respiratory:  Positive for shortness of breath. Negative for cough, chest tightness and wheezing.   Cardiovascular: Negative for chest pain, palpitations and leg swelling.  Gastrointestinal: Negative for constipation and diarrhea.  Genitourinary: Negative for difficulty urinating and frequency.  Musculoskeletal: Positive for arthralgias and gait problem.       Chronic pain in hip limits ambulation  Neurological: Negative for dizziness and headaches.    BP 130/86 (BP Location: Left Arm, Patient Position: Sitting, Cuff Size: Large)   Pulse 80   Temp (!) 96.7 F (35.9 C) (Temporal)   Resp 18   Ht  6' (1.829 m)   Wt (!) 316 lb 1.9 oz (143.4 kg)   SpO2 96%   BMI 42.87 kg/m   Physical Exam  Constitutional: He is oriented to person, place, and time. He appears well-developed and well-nourished. No distress.  Morbid obesity  HENT:  Head: Normocephalic and atraumatic.  Nose: Nose normal.  Mouth/Throat: Oropharynx is clear and moist. No oropharyngeal exudate.  Eyes: Conjunctivae are normal. Pupils are equal, round, and reactive to light.  Neck: Normal range of motion. No JVD present.  Cardiovascular: Normal rate.  Murmur heard. Prominent systolic murmur  Pulmonary/Chest: Effort normal and breath sounds normal. No respiratory distress. He has no wheezes. He has no rales. He exhibits no tenderness.  Abdominal:  Protuberant abdomen with large pannus  Musculoskeletal: He exhibits edema.  Neurological: He is alert and oriented to person, place, and time.  Psychiatric: He has a normal mood and affect. His behavior is normal. Thought content normal.    ASSESSMENT/PLAN:  1. Severe aortic stenosis Under care of cardiology.  Echocardiogram every 6 months.  Salt restriction and fluid management are recommended.  Daily weights are recommended.  2. Essential hypertension Controlled  3. Chronic atrial fibrillation (HCC) Controlled rate  4. Status post gastric bypass for obesity Patient initially lost a lot of weight, but has been gaining weight recently.  At this visit he is lost a few pounds, it may actually be fluid since he was overloaded at his last visit.  In any event any loss is good, I congratulated him and encouraged him not to get back on his diet and exercise plan. - CBC - COMPLETE METABOLIC PANEL WITH GFR - Lipid panel - Urinalysis, Routine w reflex microscopic - Vitamin B12 - VITAMIN D 25 Hydroxy (Vit-D Deficiency, Fractures)   Patient Instructions  Pneumonia shot today Exercise every day that you are able Reduce the portions and sweets  Need blood work and a check  up in six months Call sooner for problems   Joel MooreYvonne Sue Jaser Fullen, MD

## 2017-09-30 DIAGNOSIS — R0689 Other abnormalities of breathing: Secondary | ICD-10-CM

## 2017-11-01 ENCOUNTER — Telehealth: Payer: Self-pay

## 2017-11-01 NOTE — Addendum Note (Signed)
Addended by: Crawford GivensPUTMAN, SELENA M on: 11/01/2017 01:18 PM   Modules accepted: Orders

## 2017-11-01 NOTE — Telephone Encounter (Signed)
Left message on voicemail requesting a referral to the wound center. Wants call back

## 2017-11-01 NOTE — Telephone Encounter (Signed)
May refer 

## 2017-11-01 NOTE — Telephone Encounter (Signed)
Called and talked to Joel Rogers, states he has " lymph edema and open wounds on his legs". He said he has used the wound center before, and has called them and they just need a referral from you.

## 2017-11-02 ENCOUNTER — Telehealth: Payer: Self-pay

## 2017-11-02 NOTE — Addendum Note (Signed)
Addended by: Crawford GivensPUTMAN, SELENA M on: 11/02/2017 10:33 AM   Modules accepted: Orders

## 2017-11-03 ENCOUNTER — Telehealth: Payer: Self-pay | Admitting: Family Medicine

## 2017-11-03 ENCOUNTER — Ambulatory Visit (HOSPITAL_COMMUNITY): Payer: Medicare Other | Attending: Family Medicine | Admitting: Physical Therapy

## 2017-11-03 ENCOUNTER — Encounter (HOSPITAL_COMMUNITY): Payer: Self-pay | Admitting: Physical Therapy

## 2017-11-03 DIAGNOSIS — X58XXXD Exposure to other specified factors, subsequent encounter: Secondary | ICD-10-CM | POA: Insufficient documentation

## 2017-11-03 DIAGNOSIS — I890 Lymphedema, not elsewhere classified: Secondary | ICD-10-CM | POA: Diagnosis not present

## 2017-11-03 DIAGNOSIS — S81801D Unspecified open wound, right lower leg, subsequent encounter: Secondary | ICD-10-CM | POA: Diagnosis not present

## 2017-11-03 DIAGNOSIS — R262 Difficulty in walking, not elsewhere classified: Secondary | ICD-10-CM | POA: Insufficient documentation

## 2017-11-03 MED ORDER — SULFAMETHOXAZOLE-TRIMETHOPRIM 800-160 MG PO TABS: 1.0000 | ORAL_TABLET | Freq: Two times a day (BID) | ORAL | 0 refills | Status: DC

## 2017-11-03 NOTE — Telephone Encounter (Signed)
Joel Rogers aware abx sent to [facility].

## 2017-11-03 NOTE — Therapy (Signed)
[facility] [address] [place], [place], [address] Phone: [phone]   Fax:  [phone]  Wound Care Evaluation  Patient Details  Name: Joel Rogers MRN: [id number] Date of Birth: Dec 18, 1950 Referring Provider: Rica MastNelson Rogers   Encounter Date: 11/03/2017  PT End of Session - 11/03/17 1611    Visit Number  1    Number of Visits  16    Date for PT Re-Evaluation  12/03/17    Authorization Type  [employer] Medicare    Authorization - Visit Number  1    Authorization - Number of Visits  10    PT Start Time  1300    PT Stop Time  1345    PT Time Calculation (min)  45 min    Activity Tolerance  Patient tolerated treatment well    Behavior During Therapy  [facility] for tasks assessed/performed       Past Medical History:  Diagnosis Date  . Arthritis   . Atrial fibrillation (HCC)   . Diabetes mellitus without complication (HCC)    borderline  . Hyperlipidemia   . Hypertension   . Myocardial infarction (HCC)   . Normal coronary arteries    by cardiac catheterization performed 03/14/06  . Obesity   . Sleep apnea    on CPAP  . Venous insufficiency     Past Surgical History:  Procedure Laterality Date  . CRANIOTOMY Right 08/27/2016   Procedure: CRANIOTOMY HEMATOMA EVACUATION SUBDURAL;  Surgeon: Maeola HarmanJoseph Stern, MD;  [facility];  Service: Neurosurgery;  Laterality: Right;  . GASTRIC BYPASS  09/2008  . JOINT REPLACEMENT  08/31/2006   right hip  . TOTAL HIP ARTHROPLASTY     right hip    There were no vitals filed for this visit.  Subjective Assessment - 11/03/17 1346    Subjective  Joel Rogers states that he noticed that he had a flare up of the swelling about two weeks ago.   He is now having quite a bit of weeping and pain.   He has been seen for lymphedema before and is compliant with wearing his compression socks on a regular basis so he is not sure what happened.      Pertinent History  obesity, MI, venous insufficiency, HTN, Afib      Currently in Pain?  Yes    Pain Score  10-Worst pain ever    Pain Location  Leg    Pain Orientation  Right    Pain Descriptors / Indicators  Stabbing    Pain Type  Acute pain    Pain Onset  1 to 4 weeks ago    Pain Frequency  Intermittent    Aggravating Factors   edema     Pain Relieving Factors  elevating leg     Effect of Pain on Daily Activities  increases pain        [facility] PT Assessment - 11/03/17 0001      Assessment   Medical Diagnosis  Rt lymphedema with weeping     Referring Provider  Joel Rogers    Onset Date/Surgical Date  10/20/17    Next MD Visit  unknown     Prior Therapy  none       Precautions   Precautions  Other (comment)    Precaution Comments  contact       Restrictions   Weight Bearing Restrictions  No      Balance Screen   Has the patient fallen in  the past 6 months  No    Has the patient had a decrease in activity level because of a fear of falling?   Yes    Is the patient reluctant to leave their home because of a fear of falling?   No      Observation/Other Assessments   Other Surveys   -- Life impact score: 63 = 70% affected.       Wound Therapy - 11/03/17 1606    Pain Score  10-Worst pain ever    Pain Type  Acute pain    Pain Location  Leg    Pain Orientation  Right    Pain Descriptors / Indicators  Stabbing    Evaluation and Treatment Procedures Explained to Patient/Family  Yes    Evaluation and Treatment Procedures  agreed to    Wound Properties Date First Assessed: 11/03/17 Time First Assessed: 1315 Wound Type: Venous stasis ulcer;Other (Comment) Location: Leg Location Orientation: Right;Anterior Present on Admission: Yes   Dressing Type  None    Dressing Changed  New    Dressing Change Frequency  PRN    Site / Wound Assessment  Dusky;Painful    % Wound base Red or Granulating  0%    % Wound base Yellow/Fibrinous Exudate  20%    % Wound base Other/Granulation Tissue (Comment)  80%    Peri-wound Assessment   Edema;Induration;Maceration    Wound Length (cm)  10 cm    Wound Width (cm)  15 cm    Wound Depth (cm)  -- unknown wound is mushy at this time     Wound Surface Area (cm^2)  150 cm^2    Drainage Amount  Copious    Drainage Description  Serous    Non-staged Wound Description  Not applicable    Treatment  Cleansed;Debridement (Selective)    Selective Debridement - Location  -- entire wound    Selective Debridement - Tools Used  Forceps;Scissors    Selective Debridement - Tissue Removed  -- dead skin and slough     Wound Therapy - Clinical Statement  Joel Rogers is a known lymphedema patient to this clinic.  He has been wearing his compression stockings on a regular basis but noted two weeks ago that his right leg began have increased swelling.  The swelling continued despite elevating and wearing the compression and then his leg began weeping fluid.  At this point Joel Rogers called his MD who referred him to skilled physical therapy for wound and lymphedema treatment.  At evaluation Joel Rogers right LE in indurated with a large weeping wound of unknown depth located on the anterior aspect of his right LE.   Joel Rogers will benefit from skilled physical therapy services for decongestive techniques and wound care to heal his wound and decrease his edema and pain   Wound Therapy - Functional Problem List  Pain causing diffiiculty in walking     Factors Delaying/Impairing Wound Healing  Altered sensation;Infection - systemic/local;Multiple medical problems;Polypharmacy;Vascular compromise    Hydrotherapy Plan  Debridement;Dressing change;Patient/family education;Other (comment) manual techniques to for decongestion.     Wound Therapy - Frequency  2X / week    Wound Therapy - Current Recommendations  PT    Wound Plan  manual techniques for decongestion of the Rt LE, debridement and dressing change if drainage has decreased can omit alginate and dress with silverhydrofiber  with multilayer compression,  (profore dressing ):    Dressing   silverhydrofiber followed by alginate and kerlix  followed by multilayer profore compression bandage.         LYMPHEDEMA/ONCOLOGY QUESTIONNAIRE - 11/03/17 1353      Date Lymphedema/Swelling Started   Date  10/20/17      What other symptoms do you have   Are you Having Heaviness or Tightness  Yes    Are you having Pain  Yes    Are you having pitting edema  Yes    Is it Hard or Difficult finding clothes that fit  No    Do you have infections  Yes      Lymphedema Stage   Stage  STAGE 3 ELEPHANTIASIS      Lymphedema Assessments   Lymphedema Assessments  Lower extremities      Right Lower Extremity Lymphedema   30 cm Proximal to Floor at Lateral Plantar Foot  49.2 cm    20 cm Proximal to Floor at Lateral Plantar Foot  37.3 1    10  cm Proximal to Floor at Lateral Malleoli  29.7 cm    Circumference of ankle/heel  38.8 cm.    5 cm Proximal to 1st MTP Joint  26 cm    Across MTP Joint  24.3 cm      Left Lower Extremity Lymphedema   30 cm Proximal to Floor at Lateral Plantar Foot  40 cm    20 cm Proximal to Floor at Lateral Plantar Foot  30 cm    10 cm Proximal to Floor at Lateral Malleoli  26.5 cm    Circumference of ankle/heel  35.8 cm.    5 cm Proximal to 1st MTP Joint  25.3 cm    Across MTP Joint  24 cm          Objective measurements completed on examination: See above findings.            PT Education - 11/03/17 1723    Education provided  Yes    Education Details  Keep bandage cleen and dry; take a layer off at a time if it becomes to tight     Person(s) Educated  Patient    Methods  Explanation    Comprehension  Verbalized understanding       PT Short Term Goals - 11/03/17 1712      PT SHORT TERM GOAL #1   Title  Pt Rt LE  volume to be decreased by 3 cm to allow improved environment for wound healing.     Time  3    Period  Weeks    Status  New    Target Date  11/24/17      PT SHORT TERM GOAL #2   Title   Drainage from Rt LE wound to be minimal to reduce infection rate.     Time  3    Period  Weeks    Status  New    Target Date  11/24/17      PT SHORT TERM GOAL #3   Title  PT to verbalize the importance of throwing compression garments out after a year in order to assure proper compression     Time  1    Period  Weeks    Status  New    Target Date  11/10/17      PT SHORT TERM GOAL #4   Title  Wound to be 100% granulated to allow proper healing     Time  3    Period  Weeks    Status  New  PT Long Term Goals - 11/03/17 1715      PT LONG TERM GOAL #1   Title  Pt volume in Rt LE  to be decreased by 6 cm to decrease pain level to no greater than a 2/10 .     Time  6    Period  Weeks    Status  New    Target Date  12/15/17      PT LONG TERM GOAL #2   Title  PT wound drainage to be scant to reduce infection     Time  6    Period  Weeks    Status  New      PT LONG TERM GOAL #3   Title  Pt wound to be no greater size than 2cm diameter to allow pt to feel conficent in self care.     Time  8    Period  Weeks    Status  New           Plan - 11/03/17 1612    Clinical Impression Statement  see above     Clinical Presentation  Stable    Clinical Decision Making  Moderate    Rehab Potential  Good    PT Frequency  2x / week    PT Duration  8 weeks    PT Treatment/Interventions  Patient/family education;Manual techniques;Manual lymph drainage;Compression bandaging;Other (comment) debridement and dressing change of wound     PT Next Visit Plan  see above        Patient will benefit from skilled therapeutic intervention in order to improve the following deficits and impairments:  Abnormal gait, Decreased activity tolerance, Obesity, Pain, Increased edema, Impaired sensation, Difficulty walking  Visit Diagnosis: Lymphedema, not elsewhere classified  Open leg wound, right, subsequent encounter  Difficulty in walking, not elsewhere classified  G-Codes - 11/03/17  1720    Functional Assessment Tool Used (Outpatient Only)  clinical judgement;  Life impact score.     Functional Limitation  Other PT primary    Mobility: Walking and Moving Around Current Status [phone](G8978)  At least 60 percent but less than 80 percent impaired, limited or restricted    Mobility: Walking and Moving Around Goal Status [phone]([id number])  At least 20 percent but less than 40 percent impaired, limited or restricted       Problem List Patient Active Problem List   Diagnosis Date Noted  . Severe aortic stenosis 08/09/2017  . Vitamin D deficiency 12/02/2016  . Family history of prostate cancer in father 11/03/2016  . Status post gastric bypass for obesity 11/02/2016  . Subdural hematoma (HCC) 08/26/2016  . Syncope 07/13/2016  . HTN (hypertension) 04/13/2013  . Hyperlipemia 04/13/2013  . Atrial fibrillation (HCC) 02/14/2013  . Acute myocardial infarction (HCC) 06/25/2008  . EXOGENOUS OBESITY 06/18/2008  . Obstructive sleep apnea 06/18/2008   Virgina Organynthia Russell, PT CLT [phone] 11/03/2017, 5:24 PM  [facility] [address] [place], [place], [address] Phone: [phone]   Fax:  [phone]  Name: Joel LeachRalph L Rogers MRN: [id number] Date of Birth: March 20, 1951

## 2017-11-03 NOTE — Telephone Encounter (Signed)
Pt called in and is at the Wound Center, per the Wound Center he needs an Antibotic. Infected area, red , swollen, water\pus draining. [phone],  [facility]

## 2017-11-03 NOTE — Telephone Encounter (Signed)
Please call in 5 days of Septra DS twice daily

## 2017-11-04 ENCOUNTER — Ambulatory Visit (HOSPITAL_COMMUNITY): Payer: Medicare Other | Admitting: Physical Therapy

## 2017-11-04 ENCOUNTER — Telehealth (HOSPITAL_COMMUNITY): Payer: Self-pay | Admitting: Physical Therapy

## 2017-11-04 NOTE — Telephone Encounter (Signed)
Pt was not in pain last night and he will plan to come in for tx on Monday morning.

## 2017-11-06 ENCOUNTER — Other Ambulatory Visit: Payer: Self-pay | Admitting: Family Medicine

## 2017-11-07 ENCOUNTER — Ambulatory Visit (HOSPITAL_COMMUNITY): Payer: Medicare Other | Admitting: Physical Therapy

## 2017-11-07 DIAGNOSIS — S81801D Unspecified open wound, right lower leg, subsequent encounter: Secondary | ICD-10-CM

## 2017-11-07 DIAGNOSIS — R262 Difficulty in walking, not elsewhere classified: Secondary | ICD-10-CM | POA: Diagnosis not present

## 2017-11-07 DIAGNOSIS — I890 Lymphedema, not elsewhere classified: Secondary | ICD-10-CM

## 2017-11-07 NOTE — Therapy (Addendum)
[facility] [address] [place], [place], [address] Phone: [phone]   Fax:  [phone]  Wound Care Therapy  Patient Details  Name: Joel LeachRalph L Spees MRN: [id number] Date of Birth: 1951/11/11 Referring Provider: [name]   Encounter Date: 11/07/2017  PT End of Session - 11/07/17 1039    Visit Number  2    Number of Visits  16    Date for PT Re-Evaluation  12/03/17    Authorization Type  [employer]HC Medicare    Authorization - Visit Number  2    Authorization - Number of Visits  10    PT Start Time  [phone]    PT Stop Time  1030    PT Time Calculation (min)  40 min    Activity Tolerance  Patient tolerated treatment well    Behavior During Therapy  [facility] for tasks assessed/performed       Past Medical History:  Diagnosis Date  . Arthritis   . Atrial fibrillation (HCC)   . Diabetes mellitus without complication (HCC)    borderline  . Hyperlipidemia   . Hypertension   . Myocardial infarction (HCC)   . Normal coronary arteries    by cardiac catheterization performed 03/14/06  . Obesity   . Sleep apnea    on CPAP  . Venous insufficiency     Past Surgical History:  Procedure Laterality Date  . CRANIOTOMY Right 08/27/2016   Procedure: CRANIOTOMY HEMATOMA EVACUATION SUBDURAL;  Surgeon: Maeola HarmanJoseph Stern, MD;  [facility];  Service: Neurosurgery;  Laterality: Right;  . GASTRIC BYPASS  09/2008  . JOINT REPLACEMENT  08/31/2006   right hip  . TOTAL HIP ARTHROPLASTY     right hip    There were no vitals filed for this visit.        LYMPHEDEMA/ONCOLOGY QUESTIONNAIRE - 11/07/17 1038      Right Lower Extremity Lymphedema   30 cm Proximal to Floor at Lateral Plantar Foot  50 cm    20 cm Proximal to Floor at Lateral Plantar Foot  38 1    10 cm Proximal to Floor at Lateral Malleoli  28.5 cm    Circumference of ankle/heel  37.2 cm.    5 cm Proximal to 1st MTP Joint  26 cm    Across MTP Joint  25 cm           Wound Therapy  - 11/07/17 0946    Evaluation and Treatment Procedures Explained to Patient/Family  Yes    Evaluation and Treatment Procedures  agreed to    Wound Properties Date First Assessed: 11/03/17 Time First Assessed: 1315 Wound Type: Venous stasis ulcer;Other (Comment) Location: Leg Location Orientation: Right;Anterior Present on Admission: Yes   Dressing Type  None    Dressing Changed  Changed    Dressing Change Frequency  PRN    Site / Wound Assessment  Dusky;Painful    % Wound base Red or Granulating  20%    % Wound base Yellow/Fibrinous Exudate  80%    % Wound base Other/Granulation Tissue (Comment)  --    Peri-wound Assessment  Edema;Induration;Maceration    Drainage Amount  Moderate    Drainage Description  Serous    Non-staged Wound Description  Not applicable    Treatment  Cleansed;Debridement (Selective)    Selective Debridement - Location  entire wound and perimeter entire wound    Selective Debridement - Tools Used  Forceps;Scissors    Selective Debridement - Tissue Removed  slough and dead skin dead skin and slough     Wound Therapy - Clinical Statement  Dressing remains intact.  Pt with noted improvement in overall wound and perimeter, however little difference in edema at this point.  Debrided away all dry skin from perimeter and slough in wound bed to reveal granulated edeges.  Packed silver hydrofiber in wound and moisturized perimeter well.  Only able to complete 10 minutes of retro this session to help with edema.  continues to be indurated perimeter and red. Educated on compression garments (see patient education)   Wound Therapy - Functional Problem List  Pain causing diffiiculty in walking     Factors Delaying/Impairing Wound Healing  Altered sensation;Infection - systemic/local;Multiple medical problems;Polypharmacy;Vascular compromise    Hydrotherapy Plan  Debridement;Dressing change;Patient/family education;Other (comment) manual techniques to for decongestion.     Wound  Therapy - Frequency  2X / week    Wound Therapy - Current Recommendations  PT    Wound Plan  manual techniques for decongestion of the Rt LE, debridement and dressing change if drainage has decreased can omit alginate and dress with silverhydrofiber  with multilayer compression, (profore dressing)    Dressing   silverhydrofiber followed by alginate and kerlix followed by multilayer profore compression bandage.  Follow up with compression garments to ensure patient has ordered, understands to acquire new ones every 6 months and to discard old garments.              PT Education - 11/07/17 1554    Education provided  Yes    Education Details  educated on importance of obtaining new compression socks every 6 months, discarding old ones.  Educated on not placing garments in the dryer.  Given updated sheet wtih information/contact for [facility] facility.     Person(s) Educated  Patient    Methods  Explanation;Handout    Comprehension  Verbalized understanding;Need further instruction       PT Short Term Goals - 11/03/17 1712      PT SHORT TERM GOAL #1   Title  Pt Rt LE  volume to be decreased by 3 cm to allow improved environment for wound healing.     Time  3    Period  Weeks    Status  New    Target Date  11/24/17      PT SHORT TERM GOAL #2   Title  Drainage from Rt LE wound to be minimal to reduce infection rate.     Time  3    Period  Weeks    Status  New    Target Date  11/24/17      PT SHORT TERM GOAL #3   Title  PT to verbalize the importance of throwing compression garments out after a year in order to assure proper compression     Time  1    Period  Weeks    Status  New    Target Date  11/10/17      PT SHORT TERM GOAL #4   Title  Wound to be 100% granulated to allow proper healing     Time  3    Period  Weeks    Status  New        PT Long Term Goals - 11/03/17 1715      PT LONG TERM GOAL #1   Title  Pt volume in Rt LE  to be decreased by 6  cm to decrease pain level to no  greater than a 2/10 .     Time  6    Period  Weeks    Status  New    Target Date  12/15/17      PT LONG TERM GOAL #2   Title  PT wound drainage to be scant to reduce infection     Time  6    Period  Weeks    Status  New      PT LONG TERM GOAL #3   Title  Pt wound to be no greater size than 2cm diameter to allow pt to feel conficent in self care.     Time  8    Period  Weeks    Status  New              Patient will benefit from skilled therapeutic intervention in order to improve the following deficits and impairments:     Visit Diagnosis: Lymphedema, not elsewhere classified  Open leg wound, right, subsequent encounter     Problem List Patient Active Problem List   Diagnosis Date Noted  . Severe aortic stenosis 08/09/2017  . Vitamin D deficiency 12/02/2016  . Family history of prostate cancer in father 11/03/2016  . Status post gastric bypass for obesity 11/02/2016  . Subdural hematoma (HCC) 08/26/2016  . Syncope 07/13/2016  . HTN (hypertension) 04/13/2013  . Hyperlipemia 04/13/2013  . Atrial fibrillation (HCC) 02/14/2013  . Acute myocardial infarction (HCC) 06/25/2008  . EXOGENOUS OBESITY 06/18/2008  . Obstructive sleep apnea 06/18/2008   Lurena NidaAmy B Eren Ryser, PTA/CLT [phone]  Lurena NidaFrazier, Tavoris Brisk B 11/07/2017, 3:56 PM  [facility] [address] [place], [place], [address] Phone: [phone]   Fax:  [phone]  Name: Joel LeachRalph L Grand MRN: [id number] Date of Birth: [birth year]

## 2017-11-07 NOTE — Telephone Encounter (Signed)
Referral done

## 2017-11-08 ENCOUNTER — Other Ambulatory Visit: Payer: Self-pay | Admitting: Family Medicine

## 2017-11-09 ENCOUNTER — Encounter (HOSPITAL_COMMUNITY): Payer: Self-pay | Admitting: Physical Therapy

## 2017-11-09 ENCOUNTER — Ambulatory Visit (HOSPITAL_COMMUNITY): Payer: Medicare Other | Admitting: Physical Therapy

## 2017-11-09 ENCOUNTER — Telehealth: Payer: Self-pay | Admitting: Family Medicine

## 2017-11-09 DIAGNOSIS — I890 Lymphedema, not elsewhere classified: Secondary | ICD-10-CM

## 2017-11-09 DIAGNOSIS — R262 Difficulty in walking, not elsewhere classified: Secondary | ICD-10-CM | POA: Diagnosis not present

## 2017-11-09 DIAGNOSIS — S81801D Unspecified open wound, right lower leg, subsequent encounter: Secondary | ICD-10-CM | POA: Diagnosis not present

## 2017-11-09 NOTE — Telephone Encounter (Signed)
Joel SextonRalph is asking for a refill.

## 2017-11-09 NOTE — Telephone Encounter (Signed)
Pt called in and wanted another rx for antibotics, I advised that Dr Delton SeeNelson only prescribed 5 days, and she will not authorize additional meds without seeing him. He advised that he was being seen at the wound clinic.

## 2017-11-09 NOTE — Therapy (Signed)
[facility] [address] , [place], [address] Phone: [phone]   Fax:  [phone]  Wound Care Therapy  Patient Details  Name: Joel LeachRalph L Horst MRN: [id number] Date of Birth: Mar 26, 1951 Referring Provider: [name]   Encounter Date: 11/09/2017  PT End of Session - 11/09/17 1225    Visit Number  3    Number of Visits  16    Date for PT Re-Evaluation  12/03/17    Authorization Type  UHC Medicare    Authorization - Visit Number  3    Authorization - Number of Visits  10    PT Start Time  1035    PT Stop Time  1115    PT Time Calculation (min)  40 min    Activity Tolerance  Patient tolerated treatment well    Behavior During Therapy  [facility] for tasks assessed/performed       Past Medical History:  Diagnosis Date  . Arthritis   . Atrial fibrillation (HCC)   . Diabetes mellitus without complication (HCC)    borderline  . Hyperlipidemia   . Hypertension   . Myocardial infarction (HCC)   . Normal coronary arteries    by cardiac catheterization performed 03/14/06  . Obesity   . Sleep apnea    on CPAP  . Venous insufficiency     Past Surgical History:  Procedure Laterality Date  . CRANIOTOMY Right 08/27/2016   Procedure: CRANIOTOMY HEMATOMA EVACUATION SUBDURAL;  Surgeon: Maeola HarmanJoseph Stern, MD;  [facility];  Service: Neurosurgery;  Laterality: Right;  . GASTRIC BYPASS  09/2008  . JOINT REPLACEMENT  08/31/2006   right hip  . TOTAL HIP ARTHROPLASTY     right hip    There were no vitals filed for this visit.              Wound Therapy - 11/09/17 1217    Subjective  Pt states that his leg is feeling much better.  Still has occational 10/10 pain but it is just for a few seconds.  He has ordered his new compression garments.    Patient and Family Stated Goals  wound to heal/ swelling to be down.    Date of Onset  10/19/17    Prior Treatments  self care    Pain Score  5     Pain Type  Acute pain    Pain  Location  Leg    Pain Orientation  Right    Pain Descriptors / Indicators  Aching    Evaluation and Treatment Procedures Explained to Patient/Family  Yes    Evaluation and Treatment Procedures  agreed to    Wound Properties Date First Assessed: 11/03/17 Time First Assessed: 1315 Wound Type: Venous stasis ulcer;Other (Comment) Location: Leg Location Orientation: Right;Anterior Present on Admission: Yes   Dressing Type  Compression wrap    Dressing Changed  Changed    Dressing Change Frequency  PRN    Site / Wound Assessment  Granulation tissue;Yellow    % Wound base Red or Granulating  50%    % Wound base Yellow/Fibrinous Exudate  50%    Peri-wound Assessment  Edema;Maceration    Drainage Amount  Copious    Drainage Description  Serous    Treatment  Cleansed;Debridement (Selective);Other (Comment)    Selective Debridement - Location  wound bed     Selective Debridement - Tools Used  Forceps;Scalpel    Selective Debridement - Tissue Removed  -- slough  Wound Therapy - Clinical Statement  Upon removal of dressing significant decrease in edema noted but wound has drained significantly and skin around wound is now macerated.  Returned to both silveralginate and alginate dressing to absorb the drainage.  Pt wound improved with granulation but has noted depth; at least 1.5 cm at this point    Factors Delaying/Impairing Wound Healing  Altered sensation;Vascular compromise    Hydrotherapy Plan  Debridement;Dressing change;Other (comment)    Wound Therapy - Frequency  3X / week    Wound Therapy - Current Recommendations  PT    Wound Plan  continue with manual and debridment  Measure volume and wound size   Dressing   silverhydrofiber, alginate and profore     Manual Therapy  supraclavicular followed by deep and superfical abdominal routing fluid using inguinal axillary anastoamosis and Right LE                 PT Short Term Goals - 11/09/17 1225      PT SHORT TERM GOAL #1   Title   Pt Rt LE  volume to be decreased by 3 cm to allow improved environment for wound healing.     Time  3    Period  Weeks    Status  On-going      PT SHORT TERM GOAL #2   Title  Drainage from Rt LE wound to be minimal to reduce infection rate.     Time  3    Period  Weeks    Status  On-going      PT SHORT TERM GOAL #3   Title  PT to verbalize the importance of throwing compression garments out after a year in order to assure proper compression     Time  1    Period  Weeks    Status  Achieved      PT SHORT TERM GOAL #4   Title  Wound to be 100% granulated to allow proper healing     Time  3    Period  Weeks    Status  On-going        PT Long Term Goals - 11/09/17 1226      PT LONG TERM GOAL #1   Title  Pt volume in Rt LE  to be decreased by 6 cm to decrease pain level to no greater than a 2/10 .     Time  6    Period  Weeks    Status  On-going      PT LONG TERM GOAL #2   Title  PT wound drainage to be scant to reduce infection     Time  6    Period  Weeks    Status  On-going      PT LONG TERM GOAL #3   Title  Pt wound to be no greater size than 2cm diameter to allow pt to feel conficent in self care.     Time  8    Period  Weeks    Status  On-going            Plan - 11/09/17 1225    Clinical Impression Statement  see above     Rehab Potential  Good    PT Frequency  2x / week    PT Duration  8 weeks    PT Treatment/Interventions  Patient/family education;Manual techniques;Manual lymph drainage;Compression bandaging;Other (comment) debridement and dressing change of wound     PT Next Visit Plan  see above        Patient will benefit from skilled therapeutic intervention in order to improve the following deficits and impairments:  Abnormal gait, Decreased activity tolerance, Obesity, Pain, Increased edema, Impaired sensation, Difficulty walking  Visit Diagnosis: Lymphedema, not elsewhere classified  Open leg wound, right, subsequent encounter  Difficulty  in walking, not elsewhere classified     Problem List Patient Active Problem List   Diagnosis Date Noted  . Severe aortic stenosis 08/09/2017  . Vitamin D deficiency 12/02/2016  . Family history of prostate cancer in father 11/03/2016  . Status post gastric bypass for obesity 11/02/2016  . Subdural hematoma (HCC) 08/26/2016  . Syncope 07/13/2016  . HTN (hypertension) 04/13/2013  . Hyperlipemia 04/13/2013  . Atrial fibrillation (HCC) 02/14/2013  . Acute myocardial infarction (HCC) 06/25/2008  . EXOGENOUS OBESITY 06/18/2008  . Obstructive sleep apnea 06/18/2008  Virgina Organ, PT CLT [phone] 11/09/2017, 12:27 PM  [facility] [address] [place], [name], [address] Phone: [phone]   Fax:  [phone]  Name: CYLER KAPPES MRN: [id number] Date of Birth: 08/29/1951

## 2017-11-11 ENCOUNTER — Ambulatory Visit (HOSPITAL_COMMUNITY): Payer: Medicare Other

## 2017-11-11 ENCOUNTER — Encounter (HOSPITAL_COMMUNITY): Payer: Self-pay

## 2017-11-11 DIAGNOSIS — R262 Difficulty in walking, not elsewhere classified: Secondary | ICD-10-CM

## 2017-11-11 DIAGNOSIS — I890 Lymphedema, not elsewhere classified: Secondary | ICD-10-CM

## 2017-11-11 DIAGNOSIS — S81801D Unspecified open wound, right lower leg, subsequent encounter: Secondary | ICD-10-CM

## 2017-11-11 NOTE — Therapy (Signed)
[facility] [address] [place], [place], [address] Phone: [phone]   Fax:  [phone]  Wound Care Therapy  Patient Details  Name: Joel Rogers MRN: [id number] Date of Birth: 02-Mar-1951 Referring Provider: [name]   Encounter Date: 11/11/2017  PT End of Session - 11/11/17 1635    Visit Number  4    Number of Visits  16    Date for PT Re-Evaluation  12/03/17    Authorization Type  UHC Medicare    Authorization - Visit Number  4    Authorization - Number of Visits  10    PT Start Time  1425    PT Stop Time  1515    PT Time Calculation (min)  50 min    Activity Tolerance  Patient tolerated treatment well    Behavior During Therapy  [facility] for tasks assessed/performed       Past Medical History:  Diagnosis Date  . Arthritis   . Atrial fibrillation (HCC)   . Diabetes mellitus without complication (HCC)    borderline  . Hyperlipidemia   . Hypertension   . Myocardial infarction (HCC)   . Normal coronary arteries    by cardiac catheterization performed 03/14/06  . Obesity   . Sleep apnea    on CPAP  . Venous insufficiency     Past Surgical History:  Procedure Laterality Date  . CRANIOTOMY Right 08/27/2016   Procedure: CRANIOTOMY HEMATOMA EVACUATION SUBDURAL;  Surgeon: Maeola HarmanJoseph Stern, MD;  [facility];  Service: Neurosurgery;  Laterality: Right;  . GASTRIC BYPASS  09/2008  . JOINT REPLACEMENT  08/31/2006   right hip  . TOTAL HIP ARTHROPLASTY     right hip    There were no vitals filed for this visit.   Subjective Assessment - 11/11/17 1625    Subjective  Pt reports intermittent pain that can increase to 9-10/10, no reoprts of current pain though it comes and goes unexpectingly.      Pertinent History  obesity, MI, venous insufficiency, HTN, Afib     Currently in Pain?  Yes    Pain Score  9     Pain Location  Leg    Pain Orientation  Right    Pain Descriptors / Indicators  Aching    Pain Type  Acute pain     Pain Onset  1 to 4 weeks ago    Pain Frequency  Intermittent    Aggravating Factors   edema    Pain Relieving Factors  elevating leg    Effect of Pain on Daily Activities  increases pain          LYMPHEDEMA/ONCOLOGY QUESTIONNAIRE - 11/11/17 1632      Date Lymphedema/Swelling Started   Date  10/20/17      Lymphedema Stage   Stage  STAGE 3 ELEPHANTIASIS      Lymphedema Assessments   Lymphedema Assessments  Lower extremities      Right Lower Extremity Lymphedema   30 cm Proximal to Floor at Lateral Plantar Foot  47.8 cm was 49.2    20 cm Proximal to Floor at Lateral Plantar Foot  36 1 was  37.3    10 cm Proximal to Floor at Lateral Malleoli  28 cm was 29.7    Circumference of ankle/heel  36.5 cm. was  38.8    5 cm Proximal to 1st MTP Joint  25.5 cm was 26    Across MTP Joint  25 cm was  24.3           Wound Therapy - 11/11/17 1625    Subjective  Pt reports intermittent pain that can increase to 9-10/10, no reoprts of current pain though it comes and goes unexpectingly.      Patient and Family Stated Goals  wound to heal/ swelling to be down.    Date of Onset  10/19/17    Prior Treatments  self care    Pain Assessment  0-10    Evaluation and Treatment Procedures Explained to Patient/Family  Yes    Evaluation and Treatment Procedures  agreed to    Wound Properties Date First Assessed: 11/03/17 Time First Assessed: 1315 Wound Type: Venous stasis ulcer;Other (Comment) Location: Leg Location Orientation: Right;Anterior Present on Admission: Yes   Dressing Type  Compression wrap    Dressing Changed  Changed    Dressing Change Frequency  PRN    Site / Wound Assessment  Granulation tissue;Yellow    % Wound base Red or Granulating  50%    % Wound base Yellow/Fibrinous Exudate  50%    Peri-wound Assessment  Edema;Maceration    Wound Length (cm)  3.2 cm    Wound Width (cm)  3.2 cm    Wound Depth (cm)  -- unknown, atleast 1.5cm    Wound Surface Area (cm^2)  10.24 cm^2     Drainage Amount  Copious    Drainage Description  Serous    Non-staged Wound Description  Not applicable    Treatment  Cleansed;Debridement (Selective);Other (Comment) Manual retro massage    Selective Debridement - Location  wound bed     Selective Debridement - Tools Used  Forceps;Scalpel    Selective Debridement - Tissue Removed  slough and dead skin    Wound Therapy - Clinical Statement  Pt arrived with noted increased drainage on dressings per pt.  Upon removal improved skin integrity wiht no maceration present but does continue to have copious drainage and some weeping.  Selective debridement for removal of slough and dead skin perimeter.  Retro massage complete for edema control and continued with profore compression for edema assistance.  Measurements taken with vast improvements in reduction of edema.  No reports of increased pain through sessoin.  Additional ABD pad added to assist with drainage.      Wound Therapy - Functional Problem List  Pain causing diffiiculty in walking     Factors Delaying/Impairing Wound Healing  Altered sensation;Vascular compromise    Hydrotherapy Plan  Debridement;Dressing change;Other (comment)    Wound Therapy - Frequency  3X / week    Wound Therapy - Current Recommendations  PT    Wound Plan  continue with manual and debridment     Dressing   silverhydrofiber, alginate, ABD and profore                 PT Short Term Goals - 11/09/17 1225      PT SHORT TERM GOAL #1   Title  Pt Rt LE  volume to be decreased by 3 cm to allow improved environment for wound healing.     Time  3    Period  Weeks    Status  On-going      PT SHORT TERM GOAL #2   Title  Drainage from Rt LE wound to be minimal to reduce infection rate.     Time  3    Period  Weeks    Status  On-going      PT SHORT  TERM GOAL #3   Title  PT to verbalize the importance of throwing compression garments out after a year in order to assure proper compression     Time  1    Period   Weeks    Status  Achieved      PT SHORT TERM GOAL #4   Title  Wound to be 100% granulated to allow proper healing     Time  3    Period  Weeks    Status  On-going        PT Long Term Goals - 11/09/17 1226      PT LONG TERM GOAL #1   Title  Pt volume in Rt LE  to be decreased by 6 cm to decrease pain level to no greater than a 2/10 .     Time  6    Period  Weeks    Status  On-going      PT LONG TERM GOAL #2   Title  PT wound drainage to be scant to reduce infection     Time  6    Period  Weeks    Status  On-going      PT LONG TERM GOAL #3   Title  Pt wound to be no greater size than 2cm diameter to allow pt to feel conficent in self care.     Time  8    Period  Weeks    Status  On-going              Patient will benefit from skilled therapeutic intervention in order to improve the following deficits and impairments:     Visit Diagnosis: Lymphedema, not elsewhere classified  Open leg wound, right, subsequent encounter  Difficulty in walking, not elsewhere classified     Problem List Patient Active Problem List   Diagnosis Date Noted  . Severe aortic stenosis 08/09/2017  . Vitamin D deficiency 12/02/2016  . Family history of prostate cancer in father 11/03/2016  . Status post gastric bypass for obesity 11/02/2016  . Subdural hematoma (HCC) 08/26/2016  . Syncope 07/13/2016  . HTN (hypertension) 04/13/2013  . Hyperlipemia 04/13/2013  . Atrial fibrillation (HCC) 02/14/2013  . Acute myocardial infarction (HCC) 06/25/2008  . EXOGENOUS OBESITY 06/18/2008  . Obstructive sleep apnea 06/18/2008   Becky Saxasey Randen Kauth, LPTA; CBIS [phone]  Juel BurrowCockerham, Jailin Manocchio Jo 11/11/2017, 4:38 PM  [facility] [address] , [place], [address] Phone: [phone]   Fax:  [phone]  Name: Joel Rogers MRN: [id number] Date of Birth: 03-05-1951

## 2017-11-14 ENCOUNTER — Ambulatory Visit (HOSPITAL_COMMUNITY): Payer: Medicare Other | Admitting: Physical Therapy

## 2017-11-14 DIAGNOSIS — R262 Difficulty in walking, not elsewhere classified: Secondary | ICD-10-CM | POA: Diagnosis not present

## 2017-11-14 DIAGNOSIS — I890 Lymphedema, not elsewhere classified: Secondary | ICD-10-CM | POA: Diagnosis not present

## 2017-11-14 DIAGNOSIS — S81801D Unspecified open wound, right lower leg, subsequent encounter: Secondary | ICD-10-CM

## 2017-11-14 NOTE — Telephone Encounter (Signed)
Pt aware.

## 2017-11-14 NOTE — Therapy (Signed)
[facility] [address] [place], [place], [address] Phone: [phone]   Fax:  [phone]  Wound Care Therapy  Patient Details  Name: Joel Rogers MRN: [id number] Date of Birth: 22-Jan-1951 Referring Provider: [name]   Encounter Date: 11/14/2017  PT End of Session - 11/14/17 1035    Visit Number  5    Number of Visits  16    Date for PT Re-Evaluation  12/03/17    Authorization Type  UHC Medicare    Authorization - Visit Number  5    Authorization - Number of Visits  10    PT Start Time  [phone]    PT Stop Time  1025    PT Time Calculation (min)  34 min    Activity Tolerance  Patient tolerated treatment well    Behavior During Therapy  [facility] for tasks assessed/performed       Past Medical History:  Diagnosis Date  . Arthritis   . Atrial fibrillation (HCC)   . Diabetes mellitus without complication (HCC)    borderline  . Hyperlipidemia   . Hypertension   . Myocardial infarction (HCC)   . Normal coronary arteries    by cardiac catheterization performed 03/14/06  . Obesity   . Sleep apnea    on CPAP  . Venous insufficiency     Past Surgical History:  Procedure Laterality Date  . CRANIOTOMY Right 08/27/2016   Procedure: CRANIOTOMY HEMATOMA EVACUATION SUBDURAL;  Surgeon: Maeola HarmanJoseph Stern, MD;  [facility];  Service: Neurosurgery;  Laterality: Right;  . GASTRIC BYPASS  09/2008  . JOINT REPLACEMENT  08/31/2006   right hip  . TOTAL HIP ARTHROPLASTY     right hip    There were no vitals filed for this visit.              Wound Therapy - 11/14/17 1031    Subjective  pain remains intermittent but overall much better than before    Patient and Family Stated Goals  wound to heal/ swelling to be down.    Date of Onset  10/19/17    Prior Treatments  self care    Pain Assessment  0-10    Pain Score  5     Pain Type  Acute pain    Pain Location  Leg    Pain Orientation  Right    Pain Descriptors /  Indicators  Aching    Patients Stated Pain Goal  0    Pain Intervention(s)  Repositioned    Evaluation and Treatment Procedures Explained to Patient/Family  Yes    Evaluation and Treatment Procedures  agreed to    Wound Properties Date First Assessed: 11/03/17 Time First Assessed: 1315 Wound Type: Venous stasis ulcer;Other (Comment) Location: Leg Location Orientation: Right;Anterior Present on Admission: Yes   Dressing Type  Compression wrap profore    Dressing Changed  Changed    Dressing Change Frequency  PRN    Site / Wound Assessment  Granulation tissue;Yellow    % Wound base Red or Granulating  65%    % Wound base Yellow/Fibrinous Exudate  35%    Peri-wound Assessment  Edema;Maceration    Drainage Amount  Copious    Drainage Description  Serous    Non-staged Wound Description  Not applicable    Treatment  Cleansed;Debridement (Selective)    Selective Debridement - Location  wound bed     Selective Debridement - Tools Used  Forceps    Selective  Debridement - Tissue Removed  slough and dead skin    Wound Therapy - Clinical Statement  Pt with noted decrease in edema.  Massage to LE produced more drainage from wound this session.  Maceration present inferior to wound as drainage sitting in this area.  Cleansed well and applied barrier to protect inferior area from maceration.  Moisturized entire distal LE well priror to redressing with silverhydrofiber and continued with profore compression system.      Wound Therapy - Functional Problem List  Pain causing diffiiculty in walking     Factors Delaying/Impairing Wound Healing  Altered sensation;Vascular compromise    Hydrotherapy Plan  Debridement;Dressing change;Other (comment)    Wound Therapy - Frequency  3X / week    Wound Therapy - Current Recommendations  PT    Wound Plan  continue with manual and debridment     Dressing   silverhydrofiber, alginate, ABD and profore                 PT Short Term Goals - 11/09/17 1225       PT SHORT TERM GOAL #1   Title  Pt Rt LE  volume to be decreased by 3 cm to allow improved environment for wound healing.     Time  3    Period  Weeks    Status  On-going      PT SHORT TERM GOAL #2   Title  Drainage from Rt LE wound to be minimal to reduce infection rate.     Time  3    Period  Weeks    Status  On-going      PT SHORT TERM GOAL #3   Title  PT to verbalize the importance of throwing compression garments out after a year in order to assure proper compression     Time  1    Period  Weeks    Status  Achieved      PT SHORT TERM GOAL #4   Title  Wound to be 100% granulated to allow proper healing     Time  3    Period  Weeks    Status  On-going        PT Long Term Goals - 11/09/17 1226      PT LONG TERM GOAL #1   Title  Pt volume in Rt LE  to be decreased by 6 cm to decrease pain level to no greater than a 2/10 .     Time  6    Period  Weeks    Status  On-going      PT LONG TERM GOAL #2   Title  PT wound drainage to be scant to reduce infection     Time  6    Period  Weeks    Status  On-going      PT LONG TERM GOAL #3   Title  Pt wound to be no greater size than 2cm diameter to allow pt to feel conficent in self care.     Time  8    Period  Weeks    Status  On-going              Patient will benefit from skilled therapeutic intervention in order to improve the following deficits and impairments:     Visit Diagnosis: Lymphedema, not elsewhere classified  Open leg wound, right, subsequent encounter  Difficulty in walking, not elsewhere classified     Problem List Patient Active Problem List  Diagnosis Date Noted  . Severe aortic stenosis 08/09/2017  . Vitamin D deficiency 12/02/2016  . Family history of prostate cancer in father 11/03/2016  . Status post gastric bypass for obesity 11/02/2016  . Subdural hematoma (HCC) 08/26/2016  . Syncope 07/13/2016  . HTN (hypertension) 04/13/2013  . Hyperlipemia 04/13/2013  . Atrial  fibrillation (HCC) 02/14/2013  . Acute myocardial infarction (HCC) 06/25/2008  . EXOGENOUS OBESITY 06/18/2008  . Obstructive sleep apnea 06/18/2008   Lurena NidaAmy B Frazier, PTA/CLT [phone]  Lurena NidaFrazier, Amy B 11/14/2017, 10:38 AM   [facility] [address] [place], [place], [address] Phone: [phone]   Fax:  [phone]  Name: Joel Rogers MRN: [id number] Date of Birth: 30-Apr-1951

## 2017-11-16 ENCOUNTER — Ambulatory Visit (INDEPENDENT_AMBULATORY_CARE_PROVIDER_SITE_OTHER): Payer: Medicare Other | Admitting: Family Medicine

## 2017-11-16 ENCOUNTER — Encounter: Payer: Self-pay | Admitting: Family Medicine

## 2017-11-16 ENCOUNTER — Other Ambulatory Visit: Payer: Self-pay

## 2017-11-16 ENCOUNTER — Ambulatory Visit (HOSPITAL_COMMUNITY): Payer: Medicare Other | Admitting: Physical Therapy

## 2017-11-16 VITALS — BP 128/84 | HR 80 | Temp 97.8°F | Resp 18 | Ht 72.0 in | Wt 329.0 lb

## 2017-11-16 DIAGNOSIS — S81801D Unspecified open wound, right lower leg, subsequent encounter: Secondary | ICD-10-CM

## 2017-11-16 DIAGNOSIS — I872 Venous insufficiency (chronic) (peripheral): Secondary | ICD-10-CM | POA: Diagnosis not present

## 2017-11-16 DIAGNOSIS — I890 Lymphedema, not elsewhere classified: Secondary | ICD-10-CM

## 2017-11-16 DIAGNOSIS — Z23 Encounter for immunization: Secondary | ICD-10-CM

## 2017-11-16 DIAGNOSIS — L97819 Non-pressure chronic ulcer of other part of right lower leg with unspecified severity: Secondary | ICD-10-CM

## 2017-11-16 DIAGNOSIS — R262 Difficulty in walking, not elsewhere classified: Secondary | ICD-10-CM | POA: Diagnosis not present

## 2017-11-16 NOTE — Patient Instructions (Signed)
Continue therapy Call right away for infection

## 2017-11-16 NOTE — Progress Notes (Signed)
Chief Complaint  Patient presents with  . Medication Management   [name] has chronic lymphedema, venous stasis of the lower extremities.  He currently has a venous stasis ulcer on his right leg.  He is under the care of a wound clinic.  He is going 3 times a week for Unna boots dressing changes and debridement.  At first he had some infection and was given a sulfa antibiotic.  I spoke with the therapist who saw him today and she states that his wound has diminished considerably from 10-15cm to 3 cm across and has a stable base.  No evidence of cellulitis on exam. He is here because he called requesting antibiotics.  He stated that the therapist told him he needed to be on antibiotics for the duration of his treatment and that it might be a couple of months.  I explained to him that this is not true, antibiotics were used judiciously when treating venous stasis ulcers only if there is evidence of cellulitis.  In addition it is best if the wound is cultured and the antibiotics are chosen based on test results rather than covering him with broad-spectrum antibiotic which will promote resistant bacteria.  We did discuss what signs and symptoms to watch for, increasing pain, fever, purulent drainage, redness or red streaks.  I did call the physical therapist who saw him today, and advised that they should culture the base of the ulcer if they see her suspect cellulitis and call me for antibiotic choice.  Patient Active Problem List   Diagnosis Date Noted  . Severe aortic stenosis 08/09/2017  . Vitamin D deficiency 12/02/2016  . Family history of prostate cancer in father 11/03/2016  . Status post gastric bypass for obesity 11/02/2016  . Subdural hematoma (HCC) 08/26/2016  . Syncope 07/13/2016  . HTN (hypertension) 04/13/2013  . Hyperlipemia 04/13/2013  . Atrial fibrillation (HCC) 02/14/2013  . Acute myocardial infarction (HCC) 06/25/2008  . EXOGENOUS OBESITY 06/18/2008  . Obstructive sleep  apnea 06/18/2008    Outpatient Encounter Medications as of 11/16/2017  Medication Sig  . aspirin EC 81 MG tablet Take 1 tablet (81 mg total) by mouth daily.  Marland Kitchen. atorvastatin (LIPITOR) 80 MG tablet TAKE ONE TABLET BY MOUTH ONCE DAILY WITH BREAKFAST  . Calcium Carbonate-Vitamin D (CALCIUM + D PO) Take 1 tablet by mouth daily.  . colchicine 0.6 MG tablet Take 1 tablet (0.6 mg total) by mouth 2 (two) times daily.  Marland Kitchen. ibuprofen (ADVIL,MOTRIN) 800 MG tablet Take 1 tablet (800 mg total) by mouth every 8 (eight) hours as needed for moderate pain.  Marland Kitchen. losartan (COZAAR) 50 MG tablet Take 1 tablet (50 mg total) by mouth daily.  . metoprolol (LOPRESSOR) 100 MG tablet Take 1 tablet (100 mg total) by mouth 2 (two) times daily. (Patient taking differently: Take 50 mg by mouth 2 (two) times daily. )  . tadalafil (CIALIS) 20 MG tablet Take 1 tablet (20 mg total) by mouth daily as needed for erectile dysfunction.  . torsemide (DEMADEX) 20 MG tablet Take 1 tablet (20 mg total) by mouth daily.  . Vitamin D, Ergocalciferol, (DRISDOL) 50000 units CAPS capsule TAKE ONE CAPSULE BY MOUTH EVERY 7 DAYS  . [DISCONTINUED] sulfamethoxazole-trimethoprim (BACTRIM DS,SEPTRA DS) 800-160 MG tablet TAKE 1 TABLET BY MOUTH TWICE DAILY FOR 5 DAYS   No facility-administered encounter medications on file as of 11/16/2017.     No Known Allergies  Review of Systems  Constitutional: Negative for chills and fever.  Cardiovascular: Positive for leg swelling.  Skin: Positive for wound. Negative for color change.  All other systems reviewed and are negative.   BP 128/84 (BP Location: Left Arm, Patient Position: Sitting, Cuff Size: Large)   Pulse 80   Temp 97.8 F (36.6 C) (Temporal)   Resp 18   Ht 6' (1.829 m)   Wt (!) 329 lb (149.2 kg)   SpO2 97%   BMI 44.62 kg/m   Physical Exam  Constitutional: He is oriented to person, place, and time. He appears well-developed and well-nourished.  Morbid obesity  HENT:  Head:  Normocephalic and atraumatic.  Mouth/Throat: Oropharynx is clear and moist.  Cardiovascular: Normal rate, regular rhythm and normal heart sounds.  Pulmonary/Chest: Effort normal and breath sounds normal. No respiratory distress.  Musculoskeletal: He exhibits edema.  Right leg is in an Radio broadcast assistantUnna boot.  No inguinal adenopathy.  Dressing is not removed for examination.  Neurological: He is alert and oriented to person, place, and time.  Psychiatric: He has a normal mood and affect. His behavior is normal.    ASSESSMENT/PLAN:  1. Need for influenza vaccination Flu shot  refused - Flu Vaccine QUAD 36+ mos IM  2. Venous stasis ulcer of other part of right lower leg without varicose veins, unspecified ulcer stage (HCC) We discussed the general management of venous stasis ulcers.  We discussed infection.  We also discussed prevention.   Patient Instructions  Continue therapy Call right away for infection   Eustace MooreYvonne Sue Auriel Kist, MD

## 2017-11-16 NOTE — Therapy (Signed)
[facility] [address] [place], [place], [address] Phone: [phone]   Fax:  [phone]  Wound Care Therapy  Patient Details  Name: Joel LeachRalph L Gurney MRN: [id number] Date of Birth: 06-09-1951 Referring Provider: [name]   Encounter Date: 11/16/2017  PT End of Session - 11/16/17 0902    Visit Number  6    Number of Visits  16    Date for PT Re-Evaluation  12/03/17    Authorization Type  [employer]HC Medicare    Authorization - Visit Number  6    Authorization - Number of Visits  10    PT Start Time  0820    PT Stop Time  0900    PT Time Calculation (min)  40 min    Activity Tolerance  Patient tolerated treatment well    Behavior During Therapy  [facility] for tasks assessed/performed       Past Medical History:  Diagnosis Date  . Arthritis   . Atrial fibrillation (HCC)   . Diabetes mellitus without complication (HCC)    borderline  . Hyperlipidemia   . Hypertension   . Myocardial infarction (HCC)   . Normal coronary arteries    by cardiac catheterization performed 03/14/06  . Obesity   . Sleep apnea    on CPAP  . Venous insufficiency     Past Surgical History:  Procedure Laterality Date  . CRANIOTOMY Right 08/27/2016   Procedure: CRANIOTOMY HEMATOMA EVACUATION SUBDURAL;  Surgeon: Maeola HarmanJoseph Stern, MD;  [facility];  Service: Neurosurgery;  Laterality: Right;  . GASTRIC BYPASS  09/2008  . JOINT REPLACEMENT  08/31/2006   right hip  . TOTAL HIP ARTHROPLASTY     right hip    There were no vitals filed for this visit.       measurements below taken on 12/21 LYMPHEDEMA/ONCOLOGY QUESTIONNAIRE - 11/16/17 0902      Date Lymphedema/Swelling Started   Date  10/20/17      Lymphedema Stage   Stage  STAGE 3 ELEPHANTIASIS      Lymphedema Assessments   Lymphedema Assessments  Lower extremities      Right Lower Extremity Lymphedema   30 cm Proximal to Floor at Lateral Plantar Foot  47.8 cm was 49.2    20 cm Proximal to  Floor at Lateral Plantar Foot  36 1 was  37.3    10 cm Proximal to Floor at Lateral Malleoli  28 cm was 29.7    Circumference of ankle/heel  36.5 cm. was  38.8    5 cm Proximal to 1st MTP Joint  25.5 cm was 26    Across MTP Joint  25 cm was 24.3           Wound Therapy - 11/16/17 0856    Subjective  pain remains intermittent but overall much better than before    Patient and Family Stated Goals  wound to heal/ swelling to be down.    Date of Onset  10/19/17    Prior Treatments  self care    Pain Assessment  0-10    Pain Score  4  only pain with touch and certain activities    Pain Type  Acute pain    Pain Location  Leg    Pain Orientation  Right    Pain Descriptors / Indicators  Aching    Patients Stated Pain Goal  0    Pain Intervention(s)  Repositioned    Evaluation and Treatment Procedures Explained  to Patient/Family  Yes    Evaluation and Treatment Procedures  agreed to    Wound Properties Date First Assessed: 11/03/17 Time First Assessed: 1315 Wound Type: Venous stasis ulcer;Other (Comment) Location: Leg Location Orientation: Right;Anterior Present on Admission: Yes   Dressing Type  Compression wrap profore    Dressing Changed  Changed    Dressing Change Frequency  PRN    Site / Wound Assessment  Granulation tissue;Yellow    % Wound base Red or Granulating  75%    % Wound base Yellow/Fibrinous Exudate  30%    Peri-wound Assessment  Edema;Maceration    Wound Length (cm)  4 cm was 10cm    Wound Width (cm)  3 cm was 15 cm    Wound Depth (cm)  1 cm    Wound Volume (cm^3)  12 cm^3    Wound Surface Area (cm^2)  12 cm^2    Drainage Amount  Copious    Drainage Description  Serous    Non-staged Wound Description  Not applicable    Treatment  Cleansed;Debridement (Selective)    Selective Debridement - Location  wound bed     Selective Debridement - Tools Used  Forceps    Selective Debridement - Tissue Removed  slough and dead skin    Wound Therapy - Clinical Statement  Wound  remeasured this session wtih overall decreasing size and increasing granulation.  Lateral border still with heavy slough/depth and source of drainage from neigboring area measuring 8cmX6cm that is superficial but raw.  Large area inferior to wound (approx 10cmX8cm) continues to become macerated from drainage.  Added calcium alginate to this area to help wick moisture away.  Massage and moisturizing completed to LE prior to rebandaging.  continued with silver hydrofiber into woundbed.      Wound Therapy - Functional Problem List  Pain causing diffiiculty in walking     Factors Delaying/Impairing Wound Healing  Altered sensation;Vascular compromise    Hydrotherapy Plan  Debridement;Dressing change;Other (comment)    Wound Therapy - Frequency  3X / week    Wound Therapy - Current Recommendations  PT    Wound Plan  continue with manual and debridment     Dressing   silverhydrofiber, alginate, ABD and profore                 PT Short Term Goals - 11/09/17 1225      PT SHORT TERM GOAL #1   Title  Pt Rt LE  volume to be decreased by 3 cm to allow improved environment for wound healing.     Time  3    Period  Weeks    Status  On-going      PT SHORT TERM GOAL #2   Title  Drainage from Rt LE wound to be minimal to reduce infection rate.     Time  3    Period  Weeks    Status  On-going      PT SHORT TERM GOAL #3   Title  PT to verbalize the importance of throwing compression garments out after a year in order to assure proper compression     Time  1    Period  Weeks    Status  Achieved      PT SHORT TERM GOAL #4   Title  Wound to be 100% granulated to allow proper healing     Time  3    Period  Weeks    Status  On-going  PT Long Term Goals - 11/09/17 1226      PT LONG TERM GOAL #1   Title  Pt volume in Rt LE  to be decreased by 6 cm to decrease pain level to no greater than a 2/10 .     Time  6    Period  Weeks    Status  On-going      PT LONG TERM GOAL #2   Title   PT wound drainage to be scant to reduce infection     Time  6    Period  Weeks    Status  On-going      PT LONG TERM GOAL #3   Title  Pt wound to be no greater size than 2cm diameter to allow pt to feel conficent in self care.     Time  8    Period  Weeks    Status  On-going              Patient will benefit from skilled therapeutic intervention in order to improve the following deficits and impairments:     Visit Diagnosis: Lymphedema, not elsewhere classified  Open leg wound, right, subsequent encounter     Problem List Patient Active Problem List   Diagnosis Date Noted  . Severe aortic stenosis 08/09/2017  . Vitamin D deficiency 12/02/2016  . Family history of prostate cancer in father 11/03/2016  . Status post gastric bypass for obesity 11/02/2016  . Subdural hematoma (HCC) 08/26/2016  . Syncope 07/13/2016  . HTN (hypertension) 04/13/2013  . Hyperlipemia 04/13/2013  . Atrial fibrillation (HCC) 02/14/2013  . Acute myocardial infarction (HCC) 06/25/2008  . EXOGENOUS OBESITY 06/18/2008  . Obstructive sleep apnea 06/18/2008   Lurena Nida, PTA/CLT [phone]   Lurena Nida 11/16/2017, 9:04 AM  [facility] [address] [place], [name], [address] Phone: [phone]   Fax:  [phone]  Name: DAEMIAN GAHM MRN: [id number] Date of Birth: 1951-01-09

## 2017-11-18 ENCOUNTER — Encounter (HOSPITAL_COMMUNITY): Payer: Self-pay | Admitting: Physical Therapy

## 2017-11-18 ENCOUNTER — Other Ambulatory Visit: Payer: Self-pay | Admitting: Family Medicine

## 2017-11-18 ENCOUNTER — Ambulatory Visit (HOSPITAL_COMMUNITY): Payer: Medicare Other | Admitting: Physical Therapy

## 2017-11-18 DIAGNOSIS — S81801D Unspecified open wound, right lower leg, subsequent encounter: Secondary | ICD-10-CM | POA: Diagnosis not present

## 2017-11-18 DIAGNOSIS — R262 Difficulty in walking, not elsewhere classified: Secondary | ICD-10-CM | POA: Diagnosis not present

## 2017-11-18 DIAGNOSIS — I890 Lymphedema, not elsewhere classified: Secondary | ICD-10-CM

## 2017-11-18 NOTE — Telephone Encounter (Signed)
Seen 12 26 18

## 2017-11-18 NOTE — Therapy (Signed)
[facility] [address] [place], [place], [address] Phone: [phone]   Fax:  [phone]  Wound Care Therapy  Patient Details  Name: Joel Rogers MRN: [id number] Date of Birth: 19-Nov-1951 Referring Provider: [name]   Encounter Date: 11/18/2017  PT End of Session - 11/18/17 1611    Visit Number  7    Number of Visits  16    Date for PT Re-Evaluation  12/03/17    Authorization Type  [employer]HC Medicare    Authorization - Visit Number  7    Authorization - Number of Visits  10    PT Start Time  1517    PT Stop Time  1600    PT Time Calculation (min)  43 min    Activity Tolerance  Patient tolerated treatment well    Behavior During Therapy  [facility] for tasks assessed/performed       Past Medical History:  Diagnosis Date  . Arthritis   . Atrial fibrillation (HCC)   . Diabetes mellitus without complication (HCC)    borderline  . Hyperlipidemia   . Hypertension   . Myocardial infarction (HCC)   . Normal coronary arteries    by cardiac catheterization performed 03/14/06  . Obesity   . Sleep apnea    on CPAP  . Venous insufficiency     Past Surgical History:  Procedure Laterality Date  . CRANIOTOMY Right 08/27/2016   Procedure: CRANIOTOMY HEMATOMA EVACUATION SUBDURAL;  Surgeon: Maeola HarmanJoseph Stern, MD;  [facility];  Service: Neurosurgery;  Laterality: Right;  . GASTRIC BYPASS  09/2008  . JOINT REPLACEMENT  08/31/2006   right hip  . TOTAL HIP ARTHROPLASTY     right hip    There were no vitals filed for this visit.              Wound Therapy - 11/18/17 1606    Subjective  Pt states that he still has times where his pain will go as high as a 9/10 but not as often     Patient and Family Stated Goals  wound to heal/ swelling to be down.    Date of Onset  10/19/17    Prior Treatments  self care    Pain Score  9     Pain Type  Acute pain    Pain Location  Leg    Pain Orientation  Right    Pain Descriptors /  Indicators  Aching;Burning    Pain Intervention(s)  Repositioned;Emotional support    Evaluation and Treatment Procedures Explained to Patient/Family  Yes    Evaluation and Treatment Procedures  agreed to    Wound Properties Date First Assessed: 11/03/17 Time First Assessed: 1315 Wound Type: Venous stasis ulcer;Other (Comment) Location: Leg Location Orientation: Right;Anterior Present on Admission: Yes   Dressing Type  Compression wrap profore    Dressing Changed  Changed    Dressing Status  Old drainage    Dressing Change Frequency  PRN    Site / Wound Assessment  Granulation tissue;Yellow    % Wound base Red or Granulating  70%    % Wound base Yellow/Fibrinous Exudate  30%    Peri-wound Assessment  Edema;Maceration    Drainage Amount  Copious    Drainage Description  Serous    Non-staged Wound Description  Not applicable    Treatment  Cleansed;Debridement (Selective);Other (Comment)    Selective Debridement - Location  wound bed     Selective Debridement - Tools  Used  Forceps    Selective Debridement - Tissue Removed  slough and devitalized tissue    Wound Therapy - Clinical Statement  Wound continues to have copious drainage that is macerating skin below.  Vaseline was placed on skin inferior to drainage to attempt to protect this area.  Two layers of silverhydrofiber followed by layer of calcium alginate placed on wound.  Abdpad was placed over macerated tissue.  Pt responds well to decongestive techniques.     Wound Therapy - Functional Problem List  Pain causing diffiiculty in walking     Factors Delaying/Impairing Wound Healing  Altered sensation;Vascular compromise    Hydrotherapy Plan  Debridement;Dressing change;Other (comment)    Wound Therapy - Frequency  3X / week    Wound Therapy - Current Recommendations  PT    Wound Plan  continue with manual and debridment     Dressing   silverhydrofiber, alginate, ABD and profore     Manual Therapy  supraclavicular followed by deep and  superfical abdominal routing fluid using inguinal axillary anastoamosis and Right LE                 PT Short Term Goals - 11/09/17 1225      PT SHORT TERM GOAL #1   Title  Pt Rt LE  volume to be decreased by 3 cm to allow improved environment for wound healing.     Time  3    Period  Weeks    Status  On-going      PT SHORT TERM GOAL #2   Title  Drainage from Rt LE wound to be minimal to reduce infection rate.     Time  3    Period  Weeks    Status  On-going      PT SHORT TERM GOAL #3   Title  PT to verbalize the importance of throwing compression garments out after a year in order to assure proper compression     Time  1    Period  Weeks    Status  Achieved      PT SHORT TERM GOAL #4   Title  Wound to be 100% granulated to allow proper healing     Time  3    Period  Weeks    Status  On-going        PT Long Term Goals - 11/09/17 1226      PT LONG TERM GOAL #1   Title  Pt volume in Rt LE  to be decreased by 6 cm to decrease pain level to no greater than a 2/10 .     Time  6    Period  Weeks    Status  On-going      PT LONG TERM GOAL #2   Title  PT wound drainage to be scant to reduce infection     Time  6    Period  Weeks    Status  On-going      PT LONG TERM GOAL #3   Title  Pt wound to be no greater size than 2cm diameter to allow pt to feel conficent in self care.     Time  8    Period  Weeks    Status  On-going            Plan - 11/18/17 1612    Clinical Impression Statement  see above     Rehab Potential  Good    PT Frequency  2x /  week    PT Duration  8 weeks    PT Treatment/Interventions  Patient/family education;Manual techniques;Manual lymph drainage;Compression bandaging;Other (comment) debridement and dressing change of wound     PT Next Visit Plan  Remeasure volume and wound on Wednesday's       Patient will benefit from skilled therapeutic intervention in order to improve the following deficits and impairments:  Abnormal gait,  Decreased activity tolerance, Obesity, Pain, Increased edema, Impaired sensation, Difficulty walking  Visit Diagnosis: Lymphedema, not elsewhere classified  Open leg wound, right, subsequent encounter     Problem List Patient Active Problem List   Diagnosis Date Noted  . Severe aortic stenosis 08/09/2017  . Vitamin D deficiency 12/02/2016  . Family history of prostate cancer in father 11/03/2016  . Status post gastric bypass for obesity 11/02/2016  . Subdural hematoma (HCC) 08/26/2016  . Syncope 07/13/2016  . HTN (hypertension) 04/13/2013  . Hyperlipemia 04/13/2013  . Atrial fibrillation (HCC) 02/14/2013  . Acute myocardial infarction (HCC) 06/25/2008  . EXOGENOUS OBESITY 06/18/2008  . Obstructive sleep apnea 06/18/2008    Virgina Organynthia Russell, PT CLT [phone] 11/18/2017, 4:13 PM  [facility] [address] [place], [place], [address] Phone: [phone]   Fax:  [phone]  Name: Joel Rogers MRN: [id number] Date of Birth: [birth year]

## 2017-11-21 ENCOUNTER — Encounter (HOSPITAL_COMMUNITY): Payer: Self-pay | Admitting: Physical Therapy

## 2017-11-21 ENCOUNTER — Ambulatory Visit (HOSPITAL_COMMUNITY): Payer: Medicare Other | Admitting: Physical Therapy

## 2017-11-21 DIAGNOSIS — I890 Lymphedema, not elsewhere classified: Secondary | ICD-10-CM

## 2017-11-21 DIAGNOSIS — S81801D Unspecified open wound, right lower leg, subsequent encounter: Secondary | ICD-10-CM

## 2017-11-21 DIAGNOSIS — R262 Difficulty in walking, not elsewhere classified: Secondary | ICD-10-CM

## 2017-11-21 NOTE — Therapy (Signed)
[facility] [address] [place], [place], [address] Phone: [phone]   Fax:  [phone]  Wound Care Therapy  Patient Details  Name: Joel Rogers MRN: [id number] Date of Birth: 1951-11-09 Referring Provider: [name]   Encounter Date: 11/21/2017  PT End of Session - 11/21/17 1009    Visit Number  8    Number of Visits  16    Date for PT Re-Evaluation  12/03/17    Authorization Type  [employer] Medicare    Authorization - Visit Number  8    Authorization - Number of Visits  10    PT Start Time  0830 Pt called in as a wait list therefore was slightly late    PT Stop Time  0910    PT Time Calculation (min)  40 min    Activity Tolerance  Patient tolerated treatment well    Behavior During Therapy  [facility] for tasks assessed/performed       Past Medical History:  Diagnosis Date  . Arthritis   . Atrial fibrillation (HCC)   . Diabetes mellitus without complication (HCC)    borderline  . Hyperlipidemia   . Hypertension   . Myocardial infarction (HCC)   . Normal coronary arteries    by cardiac catheterization performed 03/14/06  . Obesity   . Sleep apnea    on CPAP  . Venous insufficiency     Past Surgical History:  Procedure Laterality Date  . CRANIOTOMY Right 08/27/2016   Procedure: CRANIOTOMY HEMATOMA EVACUATION SUBDURAL;  Surgeon: Maeola HarmanJoseph Stern, MD;  [facility];  Service: Neurosurgery;  Laterality: Right;  . GASTRIC BYPASS  09/2008  . JOINT REPLACEMENT  08/31/2006   right hip  . TOTAL HIP ARTHROPLASTY     right hip    There were no vitals filed for this visit.              Wound Therapy - 11/21/17 0948    Subjective  Pt states that his leg is not as sore as it was but he is still having occasional high level pain which Ibuprofen isable to reduce.    Patient and Family Stated Goals  wound to heal/ swelling to be down.    Date of Onset  10/19/17    Prior Treatments  self care    Pain Score  9     Pain  Type  Acute pain    Pain Location  Leg    Pain Orientation  Right    Pain Descriptors / Indicators  Aching;Burning    Pain Intervention(s)  Emotional support    Evaluation and Treatment Procedures Explained to Patient/Family  Yes    Evaluation and Treatment Procedures  agreed to    Wound Properties Date First Assessed: 11/03/17 Time First Assessed: 1315 Wound Type: Venous stasis ulcer;Other (Comment) Location: Leg Location Orientation: Right;Anterior Present on Admission: Yes   Dressing Type  Compression wrap profore    Dressing Changed  Changed    Dressing Status  Old drainage    Dressing Change Frequency  PRN    Site / Wound Assessment  Granulation tissue;Yellow    % Wound base Red or Granulating  50%    % Wound base Yellow/Fibrinous Exudate  50%    Peri-wound Assessment  Edema;Maceration    Drainage Amount  Copious    Drainage Description  Serous    Non-staged Wound Description  Not applicable    Treatment  Cleansed;Debridement (Selective)    Wound  Properties Date First Assessed: 11/21/17 Time First Assessed: 0830 Wound Type: Venous stasis ulcer;Other (Comment) Location: Leg Location Orientation: Right;Lower;Lateral Wound Description (Comments): This area had been friabile since initial evaluation but now open with a .6 cm area in the distal wound that is draining purulent drainage.  This is connected underneath to the first wound as if you push on the anterior aspect of the leg increased purulent drainage comes out of the opening.  Present on Admission: -   Dressing Type  Compression wrap    Dressing Changed  Changed    Dressing Status  Old drainage    Dressing Change Frequency  PRN    Site / Wound Assessment  Friable;Granulation tissue    % Wound base Red or Granulating  85%    % Wound base Yellow/Fibrinous Exudate  15%    Peri-wound Assessment  Edema;Maceration    Wound Length (cm)  4 cm    Wound Width (cm)  5 cm with a noted deeper .6cm diameter wound within this wound     Wound  Depth (cm)  -- unknown therapist feels two wounds are connected with tunnel    Wound Surface Area (cm^2)  20 cm^2    Undermining (cm)  laterally     Drainage Amount  Copious    Drainage Description  Purulent    Treatment  Cleansed;Debridement (Selective)    Selective Debridement - Location  wound bed     Selective Debridement - Tools Used  Forceps    Selective Debridement - Tissue Removed  slough and devitalized tissue    Wound Therapy - Clinical Statement  Pt has had an area lateral to his wound that was friable.  This has now opened and is 4x5 cm with unknown depth.  Remember initally skin damage at eval with unknown tissue dammage was 10x15 cm.  There is a small opening within this area of .6 cm that has an unknown depth where significant purulent drainage is coming from.  Therapist attempted to push as much of the drainage out as possible.  If this area continues to have purulent drainage treating therapist should request and culture wound on the same day explaining to MD imperative to recieve order for the culture back on the same day.  Pt is still on antibiotics until next visit.  Overall swelling has decreased significantly.  Depth of initial wound continues to increase as slough is debrided from the area.      Wound Therapy - Functional Problem List  Pain causing diffiiculty in walking     Factors Delaying/Impairing Wound Healing  Altered sensation;Vascular compromise    Hydrotherapy Plan  Debridement;Dressing change;Other (comment)    Wound Therapy - Frequency  3X / week    Wound Therapy - Current Recommendations  PT    Wound Plan  Assess lateral wound for drainage.  Measure volume and wound sizes as well as continue with manual and debridment     Dressing   silverhydrofiber, alginate, ABD and profore     Manual Therapy  unable to perform today due to time restraints.                 PT Short Term Goals - 11/09/17 1225      PT SHORT TERM GOAL #1   Title  Pt Rt LE  volume to  be decreased by 3 cm to allow improved environment for wound healing.     Time  3    Period  Weeks  Status  On-going      PT SHORT TERM GOAL #2   Title  Drainage from Rt LE wound to be minimal to reduce infection rate.     Time  3    Period  Weeks    Status  On-going      PT SHORT TERM GOAL #3   Title  PT to verbalize the importance of throwing compression garments out after a year in order to assure proper compression     Time  1    Period  Weeks    Status  Achieved      PT SHORT TERM GOAL #4   Title  Wound to be 100% granulated to allow proper healing     Time  3    Period  Weeks    Status  On-going        PT Long Term Goals - 11/09/17 1226      PT LONG TERM GOAL #1   Title  Pt volume in Rt LE  to be decreased by 6 cm to decrease pain level to no greater than a 2/10 .     Time  6    Period  Weeks    Status  On-going      PT LONG TERM GOAL #2   Title  PT wound drainage to be scant to reduce infection     Time  6    Period  Weeks    Status  On-going      PT LONG TERM GOAL #3   Title  Pt wound to be no greater size than 2cm diameter to allow pt to feel conficent in self care.     Time  8    Period  Weeks    Status  On-going            Plan - 11/21/17 1009    Clinical Impression Statement  seea above     Rehab Potential  Good    PT Frequency  2x / week    PT Duration  8 weeks    PT Treatment/Interventions  Patient/family education;Manual techniques;Manual lymph drainage;Compression bandaging;Other (comment) debridement and dressing change of wound     PT Next Visit Plan  Remeasure volume and wound on Wednesday's       Patient will benefit from skilled therapeutic intervention in order to improve the following deficits and impairments:  Abnormal gait, Decreased activity tolerance, Obesity, Pain, Increased edema, Impaired sensation, Difficulty walking  Visit Diagnosis: Lymphedema, not elsewhere classified  Open leg wound, right, subsequent  encounter  Difficulty in walking, not elsewhere classified     Problem List Patient Active Problem List   Diagnosis Date Noted  . Severe aortic stenosis 08/09/2017  . Vitamin D deficiency 12/02/2016  . Family history of prostate cancer in father 11/03/2016  . Status post gastric bypass for obesity 11/02/2016  . Subdural hematoma (HCC) 08/26/2016  . Syncope 07/13/2016  . HTN (hypertension) 04/13/2013  . Hyperlipemia 04/13/2013  . Atrial fibrillation (HCC) 02/14/2013  . Acute myocardial infarction (HCC) 06/25/2008  . EXOGENOUS OBESITY 06/18/2008  . Obstructive sleep apnea 06/18/2008    Virgina Organynthia Zienna Ahlin, PT CLT [phone] 11/21/2017, 10:10 AM  [facility] [address] [place], [place], [address] Phone: [phone]   Fax:  [phone]  Name: Joel LeachRalph L Rogers MRN: [id number] Date of Birth: Feb 04, 1951

## 2017-11-23 ENCOUNTER — Telehealth: Payer: Self-pay

## 2017-11-23 ENCOUNTER — Encounter (HOSPITAL_COMMUNITY): Payer: Self-pay | Admitting: Physical Therapy

## 2017-11-23 ENCOUNTER — Ambulatory Visit (HOSPITAL_COMMUNITY): Payer: Medicare Other | Attending: Family Medicine | Admitting: Physical Therapy

## 2017-11-23 ENCOUNTER — Telehealth: Payer: Self-pay | Admitting: Family Medicine

## 2017-11-23 ENCOUNTER — Telehealth (HOSPITAL_COMMUNITY): Payer: Self-pay | Admitting: Physical Therapy

## 2017-11-23 DIAGNOSIS — T148XXA Other injury of unspecified body region, initial encounter: Secondary | ICD-10-CM

## 2017-11-23 DIAGNOSIS — L24A9 Irritant contact dermatitis due friction or contact with other specified body fluids: Secondary | ICD-10-CM

## 2017-11-23 DIAGNOSIS — R262 Difficulty in walking, not elsewhere classified: Secondary | ICD-10-CM | POA: Diagnosis not present

## 2017-11-23 DIAGNOSIS — S81801D Unspecified open wound, right lower leg, subsequent encounter: Secondary | ICD-10-CM | POA: Diagnosis not present

## 2017-11-23 DIAGNOSIS — M79661 Pain in right lower leg: Secondary | ICD-10-CM | POA: Insufficient documentation

## 2017-11-23 DIAGNOSIS — I890 Lymphedema, not elsewhere classified: Secondary | ICD-10-CM | POA: Diagnosis not present

## 2017-11-23 NOTE — Telephone Encounter (Signed)
Orders placed for wound culture.

## 2017-11-23 NOTE — Telephone Encounter (Signed)
[facility] stating Rayna Sextonralph is having" purulent drainage" from his leg, and feel he needs to be on abx again.

## 2017-11-23 NOTE — Telephone Encounter (Signed)
[facility]Wanda [facility] said culture will have to be done Friday because patient does not have transportation to do it today.

## 2017-11-23 NOTE — Telephone Encounter (Signed)
They are going to do the culture Friday,

## 2017-11-23 NOTE — Telephone Encounter (Signed)
I called Dr Joanie CoddingtonNelsons office and spoke with her nurse concerning Mr Corine ShelterWatkins needing antibiotics do to perulent drainage of his wound.

## 2017-11-23 NOTE — Telephone Encounter (Signed)
Noted  

## 2017-11-23 NOTE — Telephone Encounter (Signed)
Please call and confirm that they took a culture.  Then call in a 5-day supply of Septra DS

## 2017-11-23 NOTE — Telephone Encounter (Signed)
Dr Delton SeeNelson wants patient to have a culture done. I called to see if patient could come back in and he don't have transportation. We will do culture on Friday and a order will be sent to the lab for culture I spoke with Dusty and [place]

## 2017-11-23 NOTE — Therapy (Signed)
[facility] [address] , [place], [address] Phone: [phone]   Fax:  [phone]  Wound Care Therapy  Patient Details  Name: Joel Rogers MRN: [id number] Date of Birth: 1951-10-14 Referring Provider: [name]   Encounter Date: 11/23/2017  PT End of Session - 11/23/17 1439    Visit Number  9    Number of Visits  16    Date for PT Re-Evaluation  12/03/17    Authorization Type  [employer] Medicare    Authorization - Visit Number  9    Authorization - Number of Visits  16    PT Start Time  1035    PT Stop Time  1125    PT Time Calculation (min)  50 min    Activity Tolerance  Patient tolerated treatment well    Behavior During Therapy  [facility] for tasks assessed/performed       Past Medical History:  Diagnosis Date  . Arthritis   . Atrial fibrillation (HCC)   . Diabetes mellitus without complication (HCC)    borderline  . Hyperlipidemia   . Hypertension   . Myocardial infarction (HCC)   . Normal coronary arteries    by cardiac catheterization performed 03/14/06  . Obesity   . Sleep apnea    on CPAP  . Venous insufficiency     Past Surgical History:  Procedure Laterality Date  . CRANIOTOMY Right 08/27/2016   Procedure: CRANIOTOMY HEMATOMA EVACUATION SUBDURAL;  Surgeon: Maeola HarmanJoseph Stern, MD;  [facility];  Service: Neurosurgery;  Laterality: Right;  . GASTRIC BYPASS  09/2008  . JOINT REPLACEMENT  08/31/2006   right hip  . TOTAL HIP ARTHROPLASTY     right hip    There were no vitals filed for this visit.        LYMPHEDEMA/ONCOLOGY QUESTIONNAIRE - 11/23/17 1434      Right Lower Extremity Lymphedema   30 cm Proximal to Floor at Lateral Plantar Foot  47.6 cm was 47.8    20 cm Proximal to Floor at Lateral Plantar Foot  32.8 1 was 36    10 cm Proximal to Floor at Lateral Malleoli  26.4 cm was 28    Circumference of ankle/heel  36.6 cm. was 36.5    5 cm Proximal to 1st MTP Joint  26.6 cm was 25.5    Across MTP  Joint  26.9 cm was 25           Wound Therapy - 11/23/17 1425    Subjective  Pt states that he continues to have high pain in  his leg.    Patient and Family Stated Goals  wound to heal/ swelling to be down.    Date of Onset  10/19/17    Prior Treatments  self care    Pain Score  4  but will go as high as a 9    Pain Type  Acute pain    Pain Location  Leg    Pain Orientation  Right;Lateral    Pain Descriptors / Indicators  Aching;Burning    Pain Intervention(s)  Emotional support    Evaluation and Treatment Procedures Explained to Patient/Family  Yes    Evaluation and Treatment Procedures  agreed to    Wound Properties Date First Assessed: 11/03/17 Time First Assessed: 1315 Wound Type: Venous stasis ulcer;Other (Comment) Location: Leg Location Orientation: Right;Anterior Present on Admission: Yes   Dressing Type  Compression wrap profore    Dressing Changed  Changed  Dressing Status  Old drainage    Dressing Change Frequency  PRN    Site / Wound Assessment  Granulation tissue;Yellow    % Wound base Red or Granulating  50%    % Wound base Yellow/Fibrinous Exudate  50%    Peri-wound Assessment  Edema;Maceration    Wound Length (cm)  3.8 cm was 4    Wound Width (cm)  3 cm was 3    Wound Depth (cm)  -- at least 1.0 base of wound is 100% slough    Wound Surface Area (cm^2)  11.4 cm^2    Drainage Amount  Copious    Drainage Description  Serous    Non-staged Wound Description  Not applicable    Treatment  Cleansed;Debridement (Selective)    Wound Properties Date First Assessed: 11/21/17 Time First Assessed: 0830 Wound Type: Venous stasis ulcer;Other (Comment) Location: Leg Location Orientation: Right;Lower;Lateral Wound Description (Comments): This area had been fabrile since initial evaluation but now open with a .6 cm area in the distal wound that is draining purulent drainage.  This is connected underneath to the first wound as if you push on the anterior aspect of the leg  increased purulent drainage comes out of the opening.  Present on Admission: -   Dressing Type  Compression wrap    Dressing Changed  Changed    Dressing Status  Old drainage    Dressing Change Frequency  PRN    Site / Wound Assessment  Friable;Granulation tissue    % Wound base Red or Granulating  85%    % Wound base Yellow/Fibrinous Exudate  15%    Peri-wound Assessment  Edema;Maceration    Wound Length (cm)  3.5 cm    Wound Width (cm)  5 cm circular wound within this wound that is .6 cm with depth    Wound Depth (cm)  -- feel two wounds connect with tunneling under LE     Wound Surface Area (cm^2)  17.5 cm^2    Drainage Amount  Copious    Drainage Description  Purulent    Treatment  Cleansed;Debridement (Selective)    Selective Debridement - Location  wound     Selective Debridement - Tools Used  Forceps    Selective Debridement - Tissue Removed  slough and devitalized tissue    Wound Therapy - Clinical Statement  Lateral wound appears to be healing.  There is still purulent drainage comming out of the lateral wound but it is not as much as last visit.  Skin distal to wound continues to be macerated despite adding alginate.  Added another layer of alginatee to dressing today.  Called MD re needing more antibiotics.  MD wants wound cultured but we do not have culture swabes here.  One was obtained after  the visit and pt was called to see if he wanted to return for wound to be cultured but pt requests to wait until his next visit.     Wound Therapy - Functional Problem List  Pain causing diffiiculty in walking     Factors Delaying/Impairing Wound Healing  Altered sensation;Vascular compromise    Hydrotherapy Plan  Debridement;Dressing change;Other (comment)    Wound Therapy - Frequency  3X / week    Wound Therapy - Current Recommendations  PT    Wound Plan  culture wound     Dressing   silverhydrofiber,3 layers of  alginate, ABD and profore     Manual Therapy  supraclavicular, deep and  superfical abdominal followed by  LE                 PT Short Term Goals - 11/09/17 1225      PT SHORT TERM GOAL #1   Title  Pt Rt LE  volume to be decreased by 3 cm to allow improved environment for wound healing.     Time  3    Period  Weeks    Status  On-going      PT SHORT TERM GOAL #2   Title  Drainage from Rt LE wound to be minimal to reduce infection rate.     Time  3    Period  Weeks    Status  On-going      PT SHORT TERM GOAL #3   Title  PT to verbalize the importance of throwing compression garments out after a year in order to assure proper compression     Time  1    Period  Weeks    Status  Achieved      PT SHORT TERM GOAL #4   Title  Wound to be 100% granulated to allow proper healing     Time  3    Period  Weeks    Status  On-going        PT Long Term Goals - 11/09/17 1226      PT LONG TERM GOAL #1   Title  Pt volume in Rt LE  to be decreased by 6 cm to decrease pain level to no greater than a 2/10 .     Time  6    Period  Weeks    Status  On-going      PT LONG TERM GOAL #2   Title  PT wound drainage to be scant to reduce infection     Time  6    Period  Weeks    Status  On-going      PT LONG TERM GOAL #3   Title  Pt wound to be no greater size than 2cm diameter to allow pt to feel conficent in self care.     Time  8    Period  Weeks    Status  On-going            Plan - 11/23/17 1440    Clinical Impression Statement  see above     Rehab Potential  Good    PT Frequency  2x / week    PT Duration  8 weeks    PT Treatment/Interventions  Patient/family education;Manual techniques;Manual lymph drainage;Compression bandaging;Other (comment) debridement and dressing change of wound     PT Next Visit Plan  culture wound        Patient will benefit from skilled therapeutic intervention in order to improve the following deficits and impairments:  Abnormal gait, Decreased activity tolerance, Obesity, Pain, Increased edema, Impaired  sensation, Difficulty walking  Visit Diagnosis: Lymphedema, not elsewhere classified  Open leg wound, right, subsequent encounter  Difficulty in walking, not elsewhere classified     Problem List Patient Active Problem List   Diagnosis Date Noted  . Severe aortic stenosis 08/09/2017  . Vitamin D deficiency 12/02/2016  . Family history of prostate cancer in father 11/03/2016  . Status post gastric bypass for obesity 11/02/2016  . Subdural hematoma (HCC) 08/26/2016  . Syncope 07/13/2016  . HTN (hypertension) 04/13/2013  . Hyperlipemia 04/13/2013  . Atrial fibrillation (HCC) 02/14/2013  . Acute myocardial infarction (HCC) 06/25/2008  . EXOGENOUS OBESITY 06/18/2008  .  Obstructive sleep apnea 06/18/2008    Virgina Organ, PT CLT [phone] 11/23/2017, 2:41 PM  [facility] [address] [place], [name], [address] Phone: [phone]   Fax:  [phone]  Name: Joel Rogers MRN: [id number] Date of Birth: 1951/03/09

## 2017-11-25 ENCOUNTER — Encounter (HOSPITAL_COMMUNITY): Payer: Self-pay | Admitting: Physical Therapy

## 2017-11-25 ENCOUNTER — Ambulatory Visit (HOSPITAL_COMMUNITY): Payer: Medicare Other | Admitting: Physical Therapy

## 2017-11-25 ENCOUNTER — Other Ambulatory Visit (HOSPITAL_COMMUNITY)
Admission: AD | Admit: 2017-11-25 | Discharge: 2017-11-25 | Disposition: A | Discharge status: Home / Self Care | Payer: Medicare Other | Source: Other Acute Inpatient Hospital | Attending: Family Medicine | Admitting: Family Medicine

## 2017-11-25 ENCOUNTER — Other Ambulatory Visit: Payer: Self-pay | Admitting: Family Medicine

## 2017-11-25 DIAGNOSIS — I890 Lymphedema, not elsewhere classified: Secondary | ICD-10-CM

## 2017-11-25 DIAGNOSIS — R262 Difficulty in walking, not elsewhere classified: Secondary | ICD-10-CM | POA: Diagnosis not present

## 2017-11-25 DIAGNOSIS — M79661 Pain in right lower leg: Secondary | ICD-10-CM | POA: Diagnosis not present

## 2017-11-25 DIAGNOSIS — T148XXA Other injury of unspecified body region, initial encounter: Secondary | ICD-10-CM | POA: Insufficient documentation

## 2017-11-25 DIAGNOSIS — X58XXXA Exposure to other specified factors, initial encounter: Secondary | ICD-10-CM | POA: Diagnosis not present

## 2017-11-25 DIAGNOSIS — S81801D Unspecified open wound, right lower leg, subsequent encounter: Secondary | ICD-10-CM | POA: Diagnosis not present

## 2017-11-25 NOTE — Telephone Encounter (Signed)
Spoke to rehab center, state they obtained his wound culture today, rx sent to [facility] for [facility], message left for Jan.

## 2017-11-25 NOTE — Therapy (Signed)
[facility] [address] [place], [place], [address] Phone: [phone]   Fax:  [phone]  Wound Care Therapy  Patient Details  Name: Joel Rogers MRN: [id number] Date of Birth: [birth year] Referring Provider: [name]   Encounter Date: 11/25/2017  PT End of Session - 11/25/17 1202    Visit Number  10    Number of Visits  16    Date for PT Re-Evaluation  12/03/17    Authorization Type  UHC Medicare    Authorization - Visit Number  10    Authorization - Number of Visits  16    PT Start Time  0900    PT Stop Time  0950    PT Time Calculation (min)  50 min    Activity Tolerance  Patient tolerated treatment well    Behavior During Therapy  [facility]/per[facility]       Past Medical History:  Diagnosis Date  . Arthritis   . Atrial fibrillation (HCC)   . Diabetes mellitus without complication (HCC)    borderline  . Hyperlipidemia   . Hypertension   . Myocardial infarction (HCC)   . Normal coronary arteries    by cardiac catheterization performed 03/14/06  . Obesity   . Sleep apnea    on CPAP  . Venous insufficiency     Past Surgical History:  Procedure Laterality Date  . CRANIOTOMY Right 08/27/2016   Procedure: CRANIOTOMY HEMATOMA EVACUATION SUBDURAL;  Surgeon: Maeola HarmanJoseph Stern, MD;  [facility];  Service: Neurosurgery;  Laterality: Right;  . GASTRIC BYPASS  09/2008  . JOINT REPLACEMENT  08/31/2006   right hip  . TOTAL HIP ARTHROPLASTY     right hip    There were no vitals filed for this visit.              Wound Therapy - 11/25/17 1156    Subjective  Pt states that his pain still goes to a 9/10 but it is not constant so he can handle it.     Patient and Family Stated Goals  wound to healling    Date of Onset  10/19/17    Prior Treatments  self care     Pain Score  4     Pain Type  Acute pain    Pain Location  Leg    Pain Orientation  Right;Lateral    Pain Descriptors / Indicators   Aching;Burning    Pain Intervention(s)  Emotional support    Evaluation and Treatment Procedures Explained to Patient/Family  Yes    Evaluation and Treatment Procedures  agreed to    Wound Properties Date First Assessed: 11/03/17 Time First Assessed: 1315 Wound Type: Venous stasis ulcer;Other (Comment) Location: Leg Location Orientation: Right;Anterior Present on Admission: Yes   Dressing Type  Compression wrap profore    Dressing Changed  Changed    Dressing Status  Old drainage    Dressing Change Frequency  PRN    Site / Wound Assessment  Granulation tissue;Yellow    % Wound base Red or Granulating  50%    % Wound base Yellow/Fibrinous Exudate  50%    Peri-wound Assessment  Edema;Maceration    Drainage Amount  Copious    Drainage Description  Serous    Non-staged Wound Description  Not applicable    Wound Properties Date First Assessed: 11/21/17 Time First Assessed: 0830 Wound Type: Venous stasis ulcer;Other (Comment) Location: Leg Location Orientation: Right;Lower;Lateral Wound Description (Comments): This area had  been fabrile since initial evaluation but now open with a .6 cm area in the distal wound that is draining purulent drainage.  This is connected underneath to the first wound as if you push on the anterior aspect of the leg increased purulent drainage comes out of the opening.  Present on Admission: -   Dressing Type  Compression wrap    Dressing Changed  Changed    Dressing Status  Old drainage    Dressing Change Frequency  PRN    Site / Wound Assessment  Friable;Granulation tissue    % Wound base Red or Granulating  85%    % Wound base Yellow/Fibrinous Exudate  15%    Peri-wound Assessment  Edema;Maceration    Drainage Amount  Copious    Drainage Description  Purulent    Treatment  Cleansed;Debridement (Selective)    Selective Debridement - Location  wound     Selective Debridement - Tools Used  Forceps    Selective Debridement - Tissue Removed  slough and devitalized  tissue    Wound Therapy - Clinical Statement  Pt continues to have purulent drainage but it is not as thick and has some serous attributes.  Wound was cultured today.      Wound Therapy - Functional Problem List  Pain causing diffiiculty in walking     Factors Delaying/Impairing Wound Healing  Altered sensation;Vascular compromise    Hydrotherapy Plan  Debridement;Dressing change;Other (comment)    Wound Therapy - Frequency  3X / week    Wound Therapy - Current Recommendations  PT    Wound Plan  measure volume and wound site on WEd.     Dressing   silverhydrofiber,3 layers of  alginate, ABD and profore     Manual Therapy  supraclavicular, deep and superfical abdominal followed by LE                 PT Short Term Goals - 11/09/17 1225      PT SHORT TERM GOAL #1   Title  Pt Rt LE  volume to be decreased by 3 cm to allow improved environment for wound healing.     Time  3    Period  Weeks    Status  On-going      PT SHORT TERM GOAL #2   Title  Drainage from Rt LE wound to be minimal to reduce infection rate.     Time  3    Period  Weeks    Status  On-going      PT SHORT TERM GOAL #3   Title  PT to verbalize the importance of throwing compression garments out after a year in order to assure proper compression     Time  1    Period  Weeks    Status  Achieved      PT SHORT TERM GOAL #4   Title  Wound to be 100% granulated to allow proper healing     Time  3    Period  Weeks    Status  On-going        PT Long Term Goals - 11/09/17 1226      PT LONG TERM GOAL #1   Title  Pt volume in Rt LE  to be decreased by 6 cm to decrease pain level to no greater than a 2/10 .     Time  6    Period  Weeks    Status  On-going      PT  LONG TERM GOAL #2   Title  PT wound drainage to be scant to reduce infection     Time  6    Period  Weeks    Status  On-going      PT LONG TERM GOAL #3   Title  Pt wound to be no greater size than 2cm diameter to allow pt to feel conficent in  self care.     Time  8    Period  Weeks    Status  On-going            Plan - 11/25/17 1203    Clinical Impression Statement  as above     Rehab Potential  Good    PT Frequency  2x / week    PT Duration  8 weeks    PT Treatment/Interventions  Patient/family education;Manual techniques;Manual lymph drainage;Compression bandaging;Other (comment) debridement and dressing change of wound     PT Next Visit Plan  Continue with debridement and manual techniques; no manual to Rt LE at this time.        Patient will benefit from skilled therapeutic intervention in order to improve the following deficits and impairments:  Abnormal gait, Decreased activity tolerance, Obesity, Pain, Increased edema, Impaired sensation, Difficulty walking  Visit Diagnosis: Lymphedema, not elsewhere classified  Open leg wound, right, subsequent encounter  Difficulty in walking, not elsewhere classified  Pain in right lower leg     Problem List Patient Active Problem List   Diagnosis Date Noted  . Severe aortic stenosis 08/09/2017  . Vitamin D deficiency 12/02/2016  . Family history of prostate cancer in father 11/03/2016  . Status post gastric bypass for obesity 11/02/2016  . Subdural hematoma (HCC) 08/26/2016  . Syncope 07/13/2016  . HTN (hypertension) 04/13/2013  . Hyperlipemia 04/13/2013  . Atrial fibrillation (HCC) 02/14/2013  . Acute myocardial infarction (HCC) 06/25/2008  . EXOGENOUS OBESITY 06/18/2008  . Obstructive sleep apnea 06/18/2008    Joel Rogers, PT CLT [phone] 11/25/2017, 12:04 PM  [facility] [address] [place], [name], [address] Phone: [phone]   Fax:  [phone]  Name: Joel Rogers MRN: [id number] Date of Birth: December 26, 1950

## 2017-11-28 ENCOUNTER — Ambulatory Visit (HOSPITAL_COMMUNITY): Payer: Medicare Other | Admitting: Physical Therapy

## 2017-11-28 ENCOUNTER — Encounter (HOSPITAL_COMMUNITY): Payer: Self-pay | Admitting: Physical Therapy

## 2017-11-28 DIAGNOSIS — S81801D Unspecified open wound, right lower leg, subsequent encounter: Secondary | ICD-10-CM

## 2017-11-28 DIAGNOSIS — R262 Difficulty in walking, not elsewhere classified: Secondary | ICD-10-CM

## 2017-11-28 DIAGNOSIS — M79661 Pain in right lower leg: Secondary | ICD-10-CM

## 2017-11-28 DIAGNOSIS — I890 Lymphedema, not elsewhere classified: Secondary | ICD-10-CM | POA: Diagnosis not present

## 2017-11-28 LAB — AEROBIC CULTURE  (SUPERFICIAL SPECIMEN)

## 2017-11-28 LAB — AEROBIC CULTURE W GRAM STAIN (SUPERFICIAL SPECIMEN)

## 2017-11-28 NOTE — Therapy (Signed)
[place] [facility] [address] [place], [name], [address] Phone: [phone]   Fax:  [phone]  Wound Care Therapy  Patient Details  Name: Joel Rogers MRN: [id number] Date of Birth: 09/22/1951 Referring Provider: Rica Mast   Encounter Date: 11/28/2017  PT End of Session - 11/28/17 1107    Visit Number  11    Number of Visits  16    Date for PT Re-Evaluation  12/03/17    Authorization Type  UHC Medicare    Authorization - Visit Number  11    Authorization - Number of Visits  16    PT Start Time  0950    PT Stop Time  1037    PT Time Calculation (min)  47 min    Activity Tolerance  Patient tolerated treatment well    Behavior During Therapy  [facility] for tasks assessed/performed       Past Medical History:  Diagnosis Date  . Arthritis   . Atrial fibrillation (HCC)   . Diabetes mellitus without complication (HCC)    borderline  . Hyperlipidemia   . Hypertension   . Myocardial infarction (HCC)   . Normal coronary arteries    by cardiac catheterization performed 03/14/06  . Obesity   . Sleep apnea    on CPAP  . Venous insufficiency     Past Surgical History:  Procedure Laterality Date  . CRANIOTOMY Right 08/27/2016   Procedure: CRANIOTOMY HEMATOMA EVACUATION SUBDURAL;  Surgeon: Maeola Harman, MD;  Location: [facility] OR;  Service: Neurosurgery;  Laterality: Right;  . GASTRIC BYPASS  09/2008  . JOINT REPLACEMENT  08/31/2006   right hip  . TOTAL HIP ARTHROPLASTY     right hip    There were no vitals filed for this visit.              Wound Therapy - 11/28/17 1100    Subjective  PT states that his leg was bothering him all night long.  He got a new antibiotic on Saturday    Patient and Family Stated Goals  wound to healling    Date of Onset  10/19/17    Prior Treatments  self care     Pain Score  6     Pain Type  Acute pain    Pain Location  Leg    Pain Orientation  Right;Lateral    Pain Intervention(s)  Emotional  support    Evaluation and Treatment Procedures Explained to Patient/Family  Yes    Evaluation and Treatment Procedures  agreed to    Wound Properties Date First Assessed: 11/03/17 Time First Assessed: 1315 Wound Type: Venous stasis ulcer;Other (Comment) Location: Leg Location Orientation: Right;Anterior Present on Admission: Yes   Dressing Type  Compression wrap profore    Dressing Changed  Changed    Dressing Status  Old drainage    Dressing Change Frequency  PRN    Site / Wound Assessment  Granulation tissue;Yellow    % Wound base Red or Granulating  50%    % Wound base Yellow/Fibrinous Exudate  50%    Peri-wound Assessment  Edema;Maceration    Drainage Amount  Copious    Drainage Description  Serous    Non-staged Wound Description  Not applicable    Wound Properties Date First Assessed: 11/21/17 Time First Assessed: 0830 Wound Type: Venous stasis ulcer;Other (Comment) Location: Leg Location Orientation: Right;Lower;Lateral Wound Description (Comments): This area had been fabrile since initial evaluation but now open with a .6  cm area in the distal wound that is draining purulent drainage.  This is connected underneath to the first wound as if you push on the anterior aspect of the leg increased purulent drainage comes out of the opening.  Present on Admission: -   Dressing Type  Compression wrap    Dressing Changed  Changed    Dressing Status  Old drainage    Dressing Change Frequency  PRN    Site / Wound Assessment  Friable;Granulation tissue    % Wound base Red or Granulating  85%    % Wound base Yellow/Fibrinous Exudate  15%    Peri-wound Assessment  Edema;Maceration    Drainage Amount  Copious    Drainage Description  Purulent    Treatment  Cleansed;Debridement (Selective)    Selective Debridement - Location  wound     Selective Debridement - Tools Used  Forceps    Selective Debridement - Tissue Removed  slough and devitalized tissue    Wound Therapy - Clinical Statement  Increased purulent drainage from lateral wound today.  No manual completed due to time being  Spent pushing purulent drainage from sinus tract in Lateral wound.   Wound Therapy - Functional Problem List  Pain causing diffiiculty in walking     Factors Delaying/Impairing Wound Healing  Altered sensation;Vascular compromise    Hydrotherapy Plan  Debridement;Dressing change;Other (comment)    Wound Therapy - Frequency  3X / week    Wound Therapy - Current Recommendations  PT    Wound Plan  measure volume and wound site on WEd.     Dressing   silverhydrofiber,3 layers of  alginate, ABD and profore     Manual Therapy  supraclavicular, deep and superfical abdominal followed by LE                 PT Short Term Goals - 11/09/17 1225      PT SHORT TERM GOAL #1   Title  Pt Rt LE  volume to be decreased by 3 cm to allow improved environment for wound healing.     Time  3    Period  Weeks    Status  On-going      PT SHORT TERM GOAL #2   Title  Drainage from Rt LE wound to be minimal to reduce infection rate.     Time  3    Period  Weeks    Status  On-going      PT SHORT TERM GOAL #3   Title  PT to verbalize the importance of throwing compression garments out after a year in order to assure proper compression     Time  1    Period  Weeks    Status  Achieved      PT SHORT TERM GOAL #4   Title  Wound to be 100% granulated to allow proper healing     Time  3    Period  Weeks    Status  On-going        PT Long Term Goals - 11/09/17 1226      PT LONG TERM GOAL #1   Title  Pt volume in Rt LE  to be decreased by 6 cm to decrease pain level to no greater than a 2/10 .     Time  6    Period  Weeks    Status  On-going      PT LONG TERM GOAL #2   Title  PT wound drainage  to be scant to reduce infection     Time  6    Period  Weeks    Status  On-going      PT LONG TERM GOAL #3   Title  Pt wound to be no greater size than 2cm diameter to allow pt to feel conficent in self care.      Time  8    Period  Weeks    Status  On-going            Plan - 11/28/17 1108    Clinical Impression Statement  as above     Rehab Potential  Good    PT Frequency  2x / week    PT Duration  8 weeks    PT Treatment/Interventions  Patient/family education;Manual techniques;Manual lymph drainage;Compression bandaging;Other (comment) debridement and dressing change of wound     PT Next Visit Plan  Remeasure volume and wound next visit. Continue with debridement and manual techniques; no manual to Rt LE at this time.        Patient will benefit from skilled therapeutic intervention in order to improve the following deficits and impairments:  Abnormal gait, Decreased activity tolerance, Obesity, Pain, Increased edema, Impaired sensation, Difficulty walking  Visit Diagnosis: Lymphedema, not elsewhere classified  Open leg wound, right, subsequent encounter  Difficulty in walking, not elsewhere classified  Pain in right lower leg     Problem List Patient Active Problem List   Diagnosis Date Noted  . Severe aortic stenosis 08/09/2017  . Vitamin D deficiency 12/02/2016  . Family history of prostate cancer in father 11/03/2016  . Status post gastric bypass for obesity 11/02/2016  . Subdural hematoma (HCC) 08/26/2016  . Syncope 07/13/2016  . HTN (hypertension) 04/13/2013  . Hyperlipemia 04/13/2013  . Atrial fibrillation (HCC) 02/14/2013  . Acute myocardial infarction (HCC) 06/25/2008  . EXOGENOUS OBESITY 06/18/2008  . Obstructive sleep apnea 06/18/2008    Virgina Organynthia Chandelle Harkey, PT CLT [phone] 11/28/2017, 11:11 AM  [place] [facility] [address] [place], [place], [address] Phone: [phone]   Fax:  [phone]  Name: Joel Rogers MRN: [id number] Date of Birth: 11-02-1951

## 2017-11-29 ENCOUNTER — Other Ambulatory Visit: Payer: Self-pay | Admitting: Family Medicine

## 2017-11-30 ENCOUNTER — Ambulatory Visit (HOSPITAL_COMMUNITY): Payer: Medicare Other | Admitting: Physical Therapy

## 2017-11-30 DIAGNOSIS — M79661 Pain in right lower leg: Secondary | ICD-10-CM | POA: Diagnosis not present

## 2017-11-30 DIAGNOSIS — I890 Lymphedema, not elsewhere classified: Secondary | ICD-10-CM

## 2017-11-30 DIAGNOSIS — R262 Difficulty in walking, not elsewhere classified: Secondary | ICD-10-CM | POA: Diagnosis not present

## 2017-11-30 DIAGNOSIS — S81801D Unspecified open wound, right lower leg, subsequent encounter: Secondary | ICD-10-CM

## 2017-11-30 NOTE — Progress Notes (Signed)
Pt aware.

## 2017-11-30 NOTE — Therapy (Signed)
[facility] [facility] [address] [place], [place], [address] Phone: [phone]   Fax:  [phone]  Wound Care Therapy  Patient Details  Name: Joel Rogers MRN: [id number] Date of Birth: [birth year] Referring Provider: [name]   Encounter Date: 11/30/2017  PT End of Session - 11/30/17 1805    Visit Number  12    Number of Visits  16    Date for PT Re-Evaluation  12/03/17    Authorization Type  UHC Medicare    Authorization - Visit Number  12    Authorization - Number of Visits  16    PT Start Time  0950    PT Stop Time  1030    PT Time Calculation (min)  40 min    Activity Tolerance  Patient tolerated treatment well    Behavior During Therapy  [facility] for [facility] assessed/performed       Past Medical History:  Diagnosis Date  . Arthritis   . Atrial fibrillation (HCC)   . Diabetes mellitus without complication (HCC)    borderline  . Hyperlipidemia   . Hypertension   . Myocardial infarction (HCC)   . Normal coronary arteries    by cardiac catheterization performed 03/14/06  . Obesity   . Sleep apnea    on CPAP  . Venous insufficiency     Past Surgical History:  Procedure Laterality Date  . CRANIOTOMY Right 08/27/2016   Procedure: CRANIOTOMY HEMATOMA EVACUATION SUBDURAL;  Surgeon: Maeola HarmanJoseph Stern, MD;  [facility];  Service: Neurosurgery;  Laterality: Right;  . GASTRIC BYPASS  09/2008  . JOINT REPLACEMENT  08/31/2006   right hip  . TOTAL HIP ARTHROPLASTY     right hip    There were no vitals filed for this visit.        LYMPHEDEMA/ONCOLOGY QUESTIONNAIRE - 11/30/17 1804      Right Lower Extremity Lymphedema   30 cm Proximal to Floor at Lateral Plantar Foot  47.5 cm    20 cm Proximal to Floor at Lateral Plantar Foot  32.8 1    10  cm Proximal to Floor at Lateral Malleoli  26.2 cm    Circumference of ankle/heel  36.1 cm.    5 cm Proximal to 1st MTP Joint  26.5 cm    Across MTP Joint  26 cm           Wound  Therapy - 11/30/17 1759    Subjective  Pt states his LE bothers him more at night when he lays down.  STates he has not heard from MD regarding his culture.    Patient and Family Stated Goals  wound to healling    Date of Onset  10/19/17    Prior Treatments  self care     Pain Score  5     Pain Type  Acute pain    Pain Location  Leg    Pain Orientation  Right    Pain Descriptors / Indicators  Aching;Burning    Evaluation and Treatment Procedures Explained to Patient/Family  Yes    Evaluation and Treatment Procedures  agreed to    Wound Properties Date First Assessed: 11/03/17 Time First Assessed: 1315 Wound Type: Venous stasis ulcer;Other (Comment) Location: Leg Location Orientation: Right;Anterior Present on Admission: Yes   Dressing Type  Compression wrap profore    Dressing Changed  Changed    Dressing Status  Old drainage    Dressing Change Frequency  PRN    Site /  Wound Assessment  Granulation tissue;Yellow    % Wound base Red or Granulating  50%    % Wound base Yellow/Fibrinous Exudate  50%    Peri-wound Assessment  Edema;Maceration    Wound Length (cm)  3.6 cm    Wound Width (cm)  3 cm    Wound Surface Area (cm^2)  10.8 cm^2    Drainage Amount  Copious    Drainage Description  Serous    Non-staged Wound Description  Not applicable    Wound Properties Date First Assessed: 11/21/17 Time First Assessed: 0830 Wound Type: Venous stasis ulcer;Other (Comment) Location: Leg Location Orientation: Right;Lower;Lateral Wound Description (Comments): This area had been fabrile since initial evaluation but now open with a .6 cm area in the distal wound that is draining purulent drainage.  This is connected underneath to the first wound as if you push on the anterior aspect of the leg increased purulent drainage comes out of the opening.  Present on Admission: -   Dressing Type  Compression wrap    Dressing Changed  Changed    Dressing Status  Old drainage    Dressing Change Frequency  PRN     Site / Wound Assessment  Friable;Granulation tissue    % Wound base Red or Granulating  85%    % Wound base Yellow/Fibrinous Exudate  15%    Peri-wound Assessment  Edema;Maceration    Wound Length (cm)  3.4 cm    Wound Width (cm)  5 cm    Wound Surface Area (cm^2)  17 cm^2    Drainage Amount  Copious    Drainage Description  Purulent    Treatment  Cleansed;Debridement (Selective)    Selective Debridement - Location  all wounds    Selective Debridement - Tools Used  Forceps    Selective Debridement - Tissue Removed  slough and devitalized tissue    Wound Therapy - Clinical Statement  Serous drainage continues to be copius, macerating tissue beneath and around wound.  Cleansed area well and added extral hydrofiber to all areas to help absorb draiange.  Massage and moisturization completed to LE prior to dressing.  PRofore used with good results.     Wound Therapy - Functional Problem List  Pain causing diffiiculty in walking     Factors Delaying/Impairing Wound Healing  Altered sensation;Vascular compromise    Hydrotherapy Plan  Debridement;Dressing change;Other (comment)    Wound Therapy - Frequency  3X / week    Wound Therapy - Current Recommendations  PT    Wound Plan  measure volume and wound site on Wednesdays.    Dressing   silverhydrofiber,3 layers of  alginate, ABD and profore     Manual Therapy  supraclavicular, deep and superfical abdominal followed by LE                 PT Short Term Goals - 11/09/17 1225      PT SHORT TERM GOAL #1   Title  Pt Rt LE  volume to be decreased by 3 cm to allow improved environment for wound healing.     Time  3    Period  Weeks    Status  On-going      PT SHORT TERM GOAL #2   Title  Drainage from Rt LE wound to be minimal to reduce infection rate.     Time  3    Period  Weeks    Status  On-going      PT SHORT TERM GOAL #  3   Title  PT to verbalize the importance of throwing compression garments out after a year in order to assure  proper compression     Time  1    Period  Weeks    Status  Achieved      PT SHORT TERM GOAL #4   Title  Wound to be 100% granulated to allow proper healing     Time  3    Period  Weeks    Status  On-going        PT Long Term Goals - 11/09/17 1226      PT LONG TERM GOAL #1   Title  Pt volume in Rt LE  to be decreased by 6 cm to decrease pain level to no greater than a 2/10 .     Time  6    Period  Weeks    Status  On-going      PT LONG TERM GOAL #2   Title  PT wound drainage to be scant to reduce infection     Time  6    Period  Weeks    Status  On-going      PT LONG TERM GOAL #3   Title  Pt wound to be no greater size than 2cm diameter to allow pt to feel conficent in self care.     Time  8    Period  Weeks    Status  On-going              Patient will benefit from skilled therapeutic intervention in order to improve the following deficits and impairments:     Visit Diagnosis: Lymphedema, not elsewhere classified  Open leg wound, right, subsequent encounter  Difficulty in walking, not elsewhere classified     Problem List Patient Active Problem List   Diagnosis Date Noted  . Severe aortic stenosis 08/09/2017  . Vitamin D deficiency 12/02/2016  . Family history of prostate cancer in father 11/03/2016  . Status post gastric bypass for obesity 11/02/2016  . Subdural hematoma (HCC) 08/26/2016  . Syncope 07/13/2016  . HTN (hypertension) 04/13/2013  . Hyperlipemia 04/13/2013  . Atrial fibrillation (HCC) 02/14/2013  . Acute myocardial infarction (HCC) 06/25/2008  . EXOGENOUS OBESITY 06/18/2008  . Obstructive sleep apnea 06/18/2008   Lurena Nida, PTA/CLT [phone]   Lurena Nida 11/30/2017, 6:06 PM  [place] [facility] [address] [place], [name], [address] Phone: [phone]   Fax:  [phone]  Name: Joel Rogers MRN: [id number] Date of Birth: 04/28/1951

## 2017-12-01 ENCOUNTER — Telehealth: Payer: Self-pay | Admitting: Family Medicine

## 2017-12-01 NOTE — Telephone Encounter (Signed)
How long should he be on these?

## 2017-12-01 NOTE — Telephone Encounter (Signed)
Pt called in left Message, that he needed antibiotics, and he mentioned the Wound center, and stated he was not sure what was going on

## 2017-12-01 NOTE — Telephone Encounter (Signed)
I sent in 5 days when they did the culture??

## 2017-12-02 ENCOUNTER — Encounter (HOSPITAL_COMMUNITY): Payer: Self-pay | Admitting: Physical Therapy

## 2017-12-02 ENCOUNTER — Ambulatory Visit (HOSPITAL_COMMUNITY): Payer: Medicare Other | Admitting: Physical Therapy

## 2017-12-02 DIAGNOSIS — I890 Lymphedema, not elsewhere classified: Secondary | ICD-10-CM | POA: Diagnosis not present

## 2017-12-02 DIAGNOSIS — R262 Difficulty in walking, not elsewhere classified: Secondary | ICD-10-CM | POA: Diagnosis not present

## 2017-12-02 DIAGNOSIS — M79661 Pain in right lower leg: Secondary | ICD-10-CM | POA: Diagnosis not present

## 2017-12-02 DIAGNOSIS — S81801D Unspecified open wound, right lower leg, subsequent encounter: Secondary | ICD-10-CM

## 2017-12-02 NOTE — Therapy (Signed)
[facility] [address] [place], [name], [address] Phone: [phone]   Fax:  [phone]  Wound Care Therapy  Patient Details  Name: Joel Rogers MRN: [id number] Date of Birth: [birth year] Referring Provider: Rica Mast   Encounter Date: 12/02/2017  PT End of Session - 12/02/17 1110    Visit Number  13    Number of Visits  16    Date for PT Re-Evaluation  12/03/17    Authorization Type  UHC Medicare    Authorization - Visit Number  13    Authorization - Number of Visits  16    PT Start Time  0945    PT Stop Time  1035    PT Time Calculation (min)  50 min    Activity Tolerance  Patient tolerated treatment well    Behavior During Therapy  [facility] for tasks assessed/performed       Past Medical History:  Diagnosis Date  . Arthritis   . Atrial fibrillation (HCC)   . Diabetes mellitus without complication (HCC)    borderline  . Hyperlipidemia   . Hypertension   . Myocardial infarction (HCC)   . Normal coronary arteries    by cardiac catheterization performed 03/14/06  . Obesity   . Sleep apnea    on CPAP  . Venous insufficiency     Past Surgical History:  Procedure Laterality Date  . CRANIOTOMY Right 08/27/2016   Procedure: CRANIOTOMY HEMATOMA EVACUATION SUBDURAL;  Surgeon: Maeola Harman, MD;  Location: [facility] OR;  Service: Neurosurgery;  Laterality: Right;  . GASTRIC BYPASS  09/2008  . JOINT REPLACEMENT  08/31/2006   right hip  . TOTAL HIP ARTHROPLASTY     right hip    There were no vitals filed for this visit.              Wound Therapy - 12/02/17 1103    Subjective  Pt states his LE contniues to go up to a 9/10 but the frequency is not as often;at times the pain is minimal.  MD office states current antibiotic should affect current bacteria.     Patient and Family Stated Goals  wound to healling    Date of Onset  10/19/17    Prior Treatments  self care     Pain Score  4     Pain Type  Acute pain    Pain Location  Leg    Pain Orientation  Right    Pain Descriptors / Indicators  Aching    Pain Intervention(s)  Emotional support    Evaluation and Treatment Procedures Explained to Patient/Family  Yes    Evaluation and Treatment Procedures  agreed to    Wound Properties Date First Assessed: 11/03/17 Time First Assessed: 1315 Wound Type: Venous stasis ulcer;Other (Comment) Location: Leg Location Orientation: Right;Anterior Present on Admission: Yes   Dressing Type  Compression wrap profore    Dressing Changed  Changed    Dressing Status  Old drainage    Dressing Change Frequency  PRN    Site / Wound Assessment  Granulation tissue;Yellow    % Wound base Red or Granulating  65%    % Wound base Yellow/Fibrinous Exudate  35%    Peri-wound Assessment  Edema;Maceration    Drainage Amount  Copious    Drainage Description  Serous    Non-staged Wound Description  Not applicable    Wound Properties Date First Assessed: 11/21/17 Time First Assessed: 0830 Wound Type: Venous stasis ulcer;Other (  Comment) Location: Leg Location Orientation: Right;Lower;Lateral Wound Description (Comments): This area had been fabrile since initial evaluation but now open with a .6 cm area in the distal wound that is draining purulent drainage.  This is connected underneath to the first wound as if you push on the anterior aspect of the leg increased purulent drainage comes out of the opening.  Present on Admission: -   Dressing Type  Compression wrap    Dressing Changed  Changed    Dressing Status  Old drainage    Dressing Change Frequency  PRN    Site / Wound Assessment  Friable;Granulation tissue    % Wound base Red or Granulating  85%    % Wound base Yellow/Fibrinous Exudate  15%    Peri-wound Assessment  Edema;Maceration    Drainage Amount  Copious    Drainage Description  Purulent    Treatment  Cleansed;Debridement (Selective)    Selective Debridement - Location  all wounds    Selective Debridement - Tools Used   Forceps    Selective Debridement - Tissue Removed  slough and devitalized tissue    Wound Therapy - Clinical Statement  Pt drainage continues to be copious but is less in volume.  Lateral wound drainage has significant less purulent drainage.  Induration of LE is almost gone.  Pt will continue to need skilled physical therapy to promote a healing enviornment by debriding devitaliazed tissue and completing manual techniques to decrease lymph volume.    Wound Therapy - Functional Problem List  Pain causing diffiiculty in walking     Factors Delaying/Impairing Wound Healing  Altered sensation;Vascular compromise    Hydrotherapy Plan  Debridement;Dressing change;Other (comment)    Wound Therapy - Frequency  3X / week    Wound Therapy - Current Recommendations  PT    Wound Plan  measure volume and wound site on Wednesdays.    Dressing   silverhydrofiber,2 layers of  alginate, ABD and profore     Manual Therapy  supraclavicular, deep and superfical abdominal followed by LE                 PT Short Term Goals - 11/09/17 1225      PT SHORT TERM GOAL #1   Title  Pt Rt LE  volume to be decreased by 3 cm to allow improved environment for wound healing.     Time  3    Period  Weeks    Status  On-going      PT SHORT TERM GOAL #2   Title  Drainage from Rt LE wound to be minimal to reduce infection rate.     Time  3    Period  Weeks    Status  On-going      PT SHORT TERM GOAL #3   Title  PT to verbalize the importance of throwing compression garments out after a year in order to assure proper compression     Time  1    Period  Weeks    Status  Achieved      PT SHORT TERM GOAL #4   Title  Wound to be 100% granulated to allow proper healing     Time  3    Period  Weeks    Status  On-going        PT Long Term Goals - 11/09/17 1226      PT LONG TERM GOAL #1   Title  Pt volume in Rt LE  to be  decreased by 6 cm to decrease pain level to no greater than a 2/10 .     Time  6     Period  Weeks    Status  On-going      PT LONG TERM GOAL #2   Title  PT wound drainage to be scant to reduce infection     Time  6    Period  Weeks    Status  On-going      PT LONG TERM GOAL #3   Title  Pt wound to be no greater size than 2cm diameter to allow pt to feel conficent in self care.     Time  8    Period  Weeks    Status  On-going            Plan - 12/02/17 1111    Clinical Impression Statement  as above     Rehab Potential  Good    PT Frequency  2x / week    PT Duration  8 weeks    PT Treatment/Interventions  Patient/family education;Manual techniques;Manual lymph drainage;Compression bandaging;Other (comment) debridement and dressing change of wound     PT Next Visit Plan  Remeasure volume and wound on Wednesdays . Continue with debridement and manual techniques; no manual to Rt LE at this time.        Patient will benefit from skilled therapeutic intervention in order to improve the following deficits and impairments:  Abnormal gait, Decreased activity tolerance, Obesity, Pain, Increased edema, Impaired sensation, Difficulty walking  Visit Diagnosis: Lymphedema, not elsewhere classified  Open leg wound, right, subsequent encounter  Difficulty in walking, not elsewhere classified  Pain in right lower leg     Problem List Patient Active Problem List   Diagnosis Date Noted  . Severe aortic stenosis 08/09/2017  . Vitamin D deficiency 12/02/2016  . Family history of prostate cancer in father 11/03/2016  . Status post gastric bypass for obesity 11/02/2016  . Subdural hematoma (HCC) 08/26/2016  . Syncope 07/13/2016  . HTN (hypertension) 04/13/2013  . Hyperlipemia 04/13/2013  . Atrial fibrillation (HCC) 02/14/2013  . Acute myocardial infarction (HCC) 06/25/2008  . EXOGENOUS OBESITY 06/18/2008  . Obstructive sleep apnea 06/18/2008    Virgina Organ, PT CLT [phone] 12/02/2017, 11:12 AM  [facility] [address] [place], [name], [address] Phone: [phone]   Fax:  [phone]  Name: Joel Rogers MRN: [id number] Date of Birth: [birth year]

## 2017-12-05 ENCOUNTER — Encounter (HOSPITAL_COMMUNITY): Payer: Self-pay | Admitting: Physical Therapy

## 2017-12-05 ENCOUNTER — Ambulatory Visit (HOSPITAL_COMMUNITY): Payer: Medicare Other | Admitting: Physical Therapy

## 2017-12-05 ENCOUNTER — Telehealth: Payer: Self-pay | Admitting: Family Medicine

## 2017-12-05 DIAGNOSIS — M79661 Pain in right lower leg: Secondary | ICD-10-CM | POA: Diagnosis not present

## 2017-12-05 DIAGNOSIS — R262 Difficulty in walking, not elsewhere classified: Secondary | ICD-10-CM

## 2017-12-05 DIAGNOSIS — I890 Lymphedema, not elsewhere classified: Secondary | ICD-10-CM | POA: Diagnosis not present

## 2017-12-05 DIAGNOSIS — S81801D Unspecified open wound, right lower leg, subsequent encounter: Secondary | ICD-10-CM

## 2017-12-05 NOTE — Progress Notes (Signed)
Note reviewed and agree with management

## 2017-12-05 NOTE — Telephone Encounter (Signed)
Patient called in stating that his culture changed from clear to milky. Requesting antibiotics for the whole healing process as [facility]. [facility] through [facility]. Requesting a call back.

## 2017-12-05 NOTE — Therapy (Signed)
[facility] [address] [place], [place], [address] Phone: [phone]   Fax:  [phone]  Wound Care Therapy  Patient Details  Name: Joel Rogers MRN: [id number] Date of Birth: December 29, 1950 Referring Provider: [name]   Encounter Date: 12/05/2017  Joel End of Session - 12/05/17 1106    Visit Number  14    Number of Visits  20    Date for Joel Re-Evaluation  -- recert on 1/24    Authorization Type  [employer]HC Medicare    Authorization - Visit Number  13    Authorization - Number of Visits  20    Joel Start Time  0945    Joel Stop Time  1038    Joel Time Calculation (min)  53 min    Activity Tolerance  Patient tolerated treatment well    Behavior During Therapy  [facility] for tasks assessed/performed       Past Medical History:  Diagnosis Date  . Arthritis   . Atrial fibrillation (HCC)   . Diabetes mellitus without complication (HCC)    borderline  . Hyperlipidemia   . Hypertension   . Myocardial infarction (HCC)   . Normal coronary arteries    by cardiac catheterization performed 03/14/06  . Obesity   . Sleep apnea    on CPAP  . Venous insufficiency     Past Surgical History:  Procedure Laterality Date  . CRANIOTOMY Right 08/27/2016   Procedure: CRANIOTOMY HEMATOMA EVACUATION SUBDURAL;  Surgeon: Maeola HarmanJoseph Stern, MD;  [facility];  Service: Neurosurgery;  Laterality: Right;  . GASTRIC BYPASS  09/2008  . JOINT REPLACEMENT  08/31/2006   right hip  . TOTAL HIP ARTHROPLASTY     right hip    There were no vitals filed for this visit.              Wound Therapy - 12/05/17 1059    Subjective  Joel states that he has been out of antibiotics since Thursday.  His swelling has gone up over the weekend,.  His pain continutes to be constant although the intensity varies.    Patient and Family Stated Goals  wound to healling    Date of Onset  10/19/17    Prior Treatments  self care     Pain Score  4     Pain Type  Acute pain     Pain Location  Leg    Pain Orientation  Right    Pain Descriptors / Indicators  Aching    Pain Intervention(s)  Emotional support    Evaluation and Treatment Procedures Explained to Patient/Family  Yes    Evaluation and Treatment Procedures  agreed to    Wound Properties Date First Assessed: 11/03/17 Time First Assessed: 1315 Wound Type: Venous stasis ulcer;Other (Comment) Location: Leg Location Orientation: Right;Anterior Present on Admission: Yes   Dressing Type  Compression wrap profore    Dressing Changed  Changed    Dressing Status  Old drainage    Dressing Change Frequency  PRN    Site / Wound Assessment  Granulation tissue;Yellow    % Wound base Red or Granulating  60%    % Wound base Yellow/Fibrinous Exudate  40%    Peri-wound Assessment  Edema;Maceration    Drainage Amount  Copious    Drainage Description  Serous    Non-staged Wound Description  Not applicable    Treatment  Cleansed;Debridement (Selective)    Wound Properties Date First Assessed: 11/21/17 Time  First Assessed: 0830 Wound Type: Venous stasis ulcer;Other (Comment) Location: Leg Location Orientation: Right;Lower;Lateral Wound Description (Comments): This area had been fabrile since initial evaluation but now open with a .6 cm area in the distal wound that is draining purulent drainage.  This is connected underneath to the first wound as if you push on the anterior aspect of the leg increased purulent drainage comes out of the opening.  Present on Admission: -   Dressing Type  Compression wrap    Dressing Changed  Changed    Dressing Status  Old drainage    Dressing Change Frequency  PRN    Site / Wound Assessment  Friable;Granulation tissue    % Wound base Red or Granulating  85%    % Wound base Yellow/Fibrinous Exudate  15%    Peri-wound Assessment  Edema;Maceration    Drainage Amount  Copious    Drainage Description  Purulent    Treatment  Cleansed;Debridement (Selective)    Selective Debridement - Location  all  wounds    Selective Debridement - Tools Used  Forceps    Selective Debridement - Tissue Removed  slough and devitalized tissue    Wound Therapy - Clinical Statement  Joel drainage increased with more purulent drainage compared to Friday.  Inquired whether Joel was still taking antibiobics and he stated that they ran out on Thursday.  Joel wound drainage always increased in amount and becomes more purulent when he comes off of the antibiotics.  Therapist urged Joel to call MD and  explain that his drainage has increased.  Therapist feels this Joel needs to be on antibiotics until his wound is healed.     Wound Therapy - Functional Problem List  Pain causing diffiiculty in walking     Factors Delaying/Impairing Wound Healing  Altered sensation;Vascular compromise    Hydrotherapy Plan  Debridement;Dressing change;Other (comment)    Wound Therapy - Frequency  3X / week    Wound Therapy - Current Recommendations  Joel    Wound Plan  measure volume and wound site on Wednesdays.    Dressing   silverhydrofiber,2 layers of  alginate, ABD and profore     Manual Therapy  manual done today was not for lymphedem but rather presurre throughout LE to push purulent drainage out of LE>  Significant amount of drainaged was able to be extracted from lateral sinus tract.                 Joel Short Term Goals - 11/09/17 1225      Joel SHORT TERM GOAL #1   Title  Joel Rt LE  volume to be decreased by 3 cm to allow improved environment for wound healing.     Time  3    Period  Weeks    Status  On-going      Joel SHORT TERM GOAL #2   Title  Drainage from Rt LE wound to be minimal to reduce infection rate.     Time  3    Period  Weeks    Status  On-going      Joel SHORT TERM GOAL #3   Title  Joel to verbalize the importance of throwing compression garments out after a year in order to assure proper compression     Time  1    Period  Weeks    Status  Achieved      Joel SHORT TERM GOAL #4   Title  Wound to be 100%  granulated to  allow proper healing     Time  3    Period  Weeks    Status  On-going        Joel Long Term Goals - 11/09/17 1226      Joel LONG TERM GOAL #1   Title  Joel volume in Rt LE  to be decreased by 6 cm to decrease pain level to no greater than a 2/10 .     Time  6    Period  Weeks    Status  On-going      Joel LONG TERM GOAL #2   Title  Joel wound drainage to be scant to reduce infection     Time  6    Period  Weeks    Status  On-going      Joel LONG TERM GOAL #3   Title  Joel wound to be no greater size than 2cm diameter to allow Joel to feel conficent in self care.     Time  8    Period  Weeks    Status  On-going            Plan - 12/05/17 1107    Clinical Impression Statement  as above     Rehab Potential  Good    Joel Frequency  2x / week    Joel Duration  8 weeks    Joel Treatment/Interventions  Patient/family education;Manual techniques;Manual lymph drainage;Compression bandaging;Other (comment) debridement and dressing change of wound     Joel Next Visit Plan  Remeasure volume and wound on Wednesdays . Continue with debridement and manual techniques; no manual to Rt LE at this time.        Patient will benefit from skilled therapeutic intervention in order to improve the following deficits and impairments:  Abnormal gait, Decreased activity tolerance, Obesity, Pain, Increased edema, Impaired sensation, Difficulty walking  Visit Diagnosis: Lymphedema, not elsewhere classified  Open leg wound, right, subsequent encounter  Difficulty in walking, not elsewhere classified  Pain in right lower leg     Problem List Patient Active Problem List   Diagnosis Date Noted  . Severe aortic stenosis 08/09/2017  . Vitamin D deficiency 12/02/2016  . Family history of prostate cancer in father 11/03/2016  . Status post gastric bypass for obesity 11/02/2016  . Subdural hematoma (HCC) 08/26/2016  . Syncope 07/13/2016  . HTN (hypertension) 04/13/2013  . Hyperlipemia 04/13/2013   . Atrial fibrillation (HCC) 02/14/2013  . Acute myocardial infarction (HCC) 06/25/2008  . EXOGENOUS OBESITY 06/18/2008  . Obstructive sleep apnea 06/18/2008    Joel Rogers, Joel Rogers [phone] 12/05/2017, 11:08 AM  [facility] [address] [place], [name], [address] Phone: [phone]   Fax:  [phone]  Name: Joel Rogers MRN: [id number] Date of Birth: January 01, 1951

## 2017-12-05 NOTE — Telephone Encounter (Signed)
No.  I have explained to Rayna SextonRalph in detail why this is not appropriate.  Just because the prior physician did it, does not make it correct management.  I have coordinated his care with the wound clinic, and they are going to check him regularly for any signs of infection.  Chronic antibiotic therapy will place him at risk for worsening infections and complications.  If he feels he has an infection, he needs to address this with his wound care therapist.  The therapist will call me if an antibiotic is needed.

## 2017-12-07 ENCOUNTER — Telehealth: Payer: Self-pay | Admitting: Family Medicine

## 2017-12-07 ENCOUNTER — Other Ambulatory Visit: Payer: Self-pay

## 2017-12-07 ENCOUNTER — Ambulatory Visit (HOSPITAL_COMMUNITY): Payer: Medicare Other | Admitting: Physical Therapy

## 2017-12-07 ENCOUNTER — Encounter (HOSPITAL_COMMUNITY): Payer: Self-pay | Admitting: Physical Therapy

## 2017-12-07 ENCOUNTER — Telehealth (HOSPITAL_COMMUNITY): Payer: Self-pay | Admitting: Physical Therapy

## 2017-12-07 DIAGNOSIS — S81801D Unspecified open wound, right lower leg, subsequent encounter: Secondary | ICD-10-CM

## 2017-12-07 DIAGNOSIS — M79661 Pain in right lower leg: Secondary | ICD-10-CM | POA: Diagnosis not present

## 2017-12-07 DIAGNOSIS — R262 Difficulty in walking, not elsewhere classified: Secondary | ICD-10-CM

## 2017-12-07 DIAGNOSIS — I890 Lymphedema, not elsewhere classified: Secondary | ICD-10-CM

## 2017-12-07 NOTE — Telephone Encounter (Signed)
Called Dr Delton SeeNelson for [facility] concerning antibotics for Mr Corine ShelterWatkins. Left message with Tamela OddiBetsy

## 2017-12-07 NOTE — Telephone Encounter (Signed)
Did they culture?  Drainage is not an indication, cellulitis is.  Is drainage purulent? Please ask Tamela OddiBetsy and Dusty to forward calls about wound problems to a nurse.

## 2017-12-07 NOTE — Telephone Encounter (Signed)
I spoke to Florenceindy at the wound care center. cb # [phone]. She states he is having purulent drainage - large amts. And she states he has a sinus trac " when I press on it drainage comes out" she feels he needs to be on abxs until his wounds heal.

## 2017-12-07 NOTE — Telephone Encounter (Signed)
Burna MortimerWanda with [facility] is calling regarding Joel MuskratRalph Sibert --he needs a antibotic for his leg as it is draining

## 2017-12-07 NOTE — Telephone Encounter (Signed)
Called Joel Rogers, aware of mds advice.

## 2017-12-07 NOTE — Therapy (Signed)
[facility] [address] [place], [place], [address] Phone: [phone]   Fax:  [phone]  Wound Care Therapy  Patient Details  Name: Joel LeachRalph L Arechiga MRN: [id number] Date of Birth: 1951-06-23 Referring Provider: [name]   Encounter Date: 12/07/2017  PT End of Session - 12/07/17 1242    Visit Number  15    Number of Visits  20    Date for PT Re-Evaluation  -- recert on 1/24    Authorization Type  [employer] Medicare    Authorization - Visit Number  15    Authorization - Number of Visits  20    PT Start Time  0945    PT Stop Time  1035    PT Time Calculation (min)  50 min    Activity Tolerance  Patient tolerated treatment well    Behavior During Therapy  [facility] for tasks assessed/performed       Past Medical History:  Diagnosis Date  . Arthritis   . Atrial fibrillation (HCC)   . Diabetes mellitus without complication (HCC)    borderline  . Hyperlipidemia   . Hypertension   . Myocardial infarction (HCC)   . Normal coronary arteries    by cardiac catheterization performed 03/14/06  . Obesity   . Sleep apnea    on CPAP  . Venous insufficiency     Past Surgical History:  Procedure Laterality Date  . CRANIOTOMY Right 08/27/2016   Procedure: CRANIOTOMY HEMATOMA EVACUATION SUBDURAL;  Surgeon: Maeola HarmanJoseph Stern, MD;  [facility];  Service: Neurosurgery;  Laterality: Right;  . GASTRIC BYPASS  09/2008  . JOINT REPLACEMENT  08/31/2006   right hip  . TOTAL HIP ARTHROPLASTY     right hip    There were no vitals filed for this visit.        LYMPHEDEMA/ONCOLOGY QUESTIONNAIRE - 12/07/17 1055      Right Lower Extremity Lymphedema   30 cm Proximal to Floor at Lateral Plantar Foot  45.5 cm was 47.5    20 cm Proximal to Floor at Lateral Plantar Foot  33 1 was 32.8    10 cm Proximal to Floor at Lateral Malleoli  25.4 cm was 26.2    Circumference of ankle/heel  36 cm. was 36.1    5 cm Proximal to 1st MTP Joint  25.9 cm was 26.5     Across MTP Joint  26.5 cm was 26           Wound Therapy - 12/07/17 1059    Subjective  Patient states he is still having sharp 10/10 pain intermitently in his wound after lsat treatment session but that it goes away after a few seconds.     Patient and Family Stated Goals  wound to healling    Date of Onset  10/19/17    Prior Treatments  self care     Pain Type  Acute pain    Pain Location  Leg    Pain Orientation  Right    Pain Descriptors / Indicators  Aching    Pain Intervention(s)  Emotional support    Evaluation and Treatment Procedures Explained to Patient/Family  Yes    Evaluation and Treatment Procedures  agreed to    Wound Properties Date First Assessed: 12/07/17 Time First Assessed: 1003 Wound Type: Other (Comment) Location: Leg Location Orientation: Right;Lower Wound Description (Comments): this wound is distal and central to previous 2 wounds Present on Admission: No   Dressing Type  Silver  hydrofiber;Alginate;Compression wrap profore    Dressing Changed  Changed    Dressing Status  Old drainage    Dressing Change Frequency  PRN    Site / Wound Assessment  Red;Yellow    % Wound base Red or Granulating  50%    % Wound base Yellow/Fibrinous Exudate  50%    % Wound base Black/Eschar  0%    % Wound base Other/Granulation Tissue (Comment)  0%    Peri-wound Assessment  Maceration    Wound Length (cm)  0.8 cm    Wound Width (cm)  1 cm    Wound Depth (cm)  -- unknown at this time    Wound Surface Area (cm^2)  0.8 cm^2    Margins  Attached edges (approximated)    Drainage Amount  Copious    Drainage Description  Purulent    Treatment  Cleansed;Debridement (Selective);Other (Comment) manual lymph massage    Wound Properties Date First Assessed: 11/21/17 Time First Assessed: 0830 Wound Type: Venous stasis ulcer;Other (Comment) Location: Leg Location Orientation: Right;Lower;Lateral Wound Description (Comments): This area had been fabrile since initial evaluation but now open  with a .6 cm area in the distal wound that is draining purulent drainage.  This is connected underneath to the first wound as if you push on the anterior aspect of the leg increased purulent drainage comes out of the opening.  Present on Admission: -   Dressing Type  Compression wrap;Alginate;Silver hydrofiber profore    Dressing Changed  Changed    Dressing Status  Old drainage    Dressing Change Frequency  PRN    Site / Wound Assessment  Friable;Granulation tissue    % Wound base Red or Granulating  90%    % Wound base Yellow/Fibrinous Exudate  10%    % Wound base Black/Eschar  0%    % Wound base Other/Granulation Tissue (Comment)  0%    Peri-wound Assessment  Edema;Maceration    Wound Length (cm)  4.8 cm was 3.4 cm    Wound Width (cm)  1.8 cm was 5 cm    Wound Depth (cm)  -- unknow depth, sinus tract at inferior aspect    Wound Surface Area (cm^2)  8.64 cm^2    Drainage Amount  Copious    Drainage Description  Purulent    Treatment  Cleansed;Debridement (Selective);Other (Comment) manual lymph massage    Wound Properties Date First Assessed: 11/03/17 Time First Assessed: 1315 Wound Type: Venous stasis ulcer;Other (Comment) Location: Leg Location Orientation: Right;Anterior Present on Admission: Yes   Dressing Type  Alginate;Silver hydrofiber;Compression wrap profore    Dressing Changed  Changed    Dressing Status  Old drainage    Dressing Change Frequency  PRN    Site / Wound Assessment  Granulation tissue;Yellow    % Wound base Red or Granulating  50%    % Wound base Yellow/Fibrinous Exudate  50%    Peri-wound Assessment  Edema;Maceration    Wound Length (cm)  4 cm was 3.6 cm    Wound Width (cm)  2.8 cm was 3 cm    Wound Depth (cm)  -- unknown, at least 0.6 cm    Wound Surface Area (cm^2)  11.2 cm^2    Drainage Amount  Copious    Drainage Description  Serous    Treatment  Cleansed;Debridement (Selective)    Selective Debridement - Location  all wounds    Selective Debridement -  Tools Used  Forceps    Selective Debridement - Tissue  Removed  slough and devitalized tissue    Wound Therapy - Clinical Statement  Patient drainage increased with more purulent drainage compared to Monday.  Patient is still not on antibiotic and is trying to get in touch with MD as therapist recommended to address the infection limiting his wound healing.  Pt wound drainage always increased in amount and becomes more purulent when he comes off of the antibiotics.  Patient was treated by two therapists today, Valentino Saxon performed debridement and dressing, Knox Royalty performed manual lymphedema massage.     Wound Therapy - Functional Problem List  Pain causing diffiiculty in walking     Factors Delaying/Impairing Wound Healing  Altered sensation;Vascular compromise    Hydrotherapy Plan  Debridement;Dressing change;Other (comment)    Wound Therapy - Frequency  3X / week    Wound Therapy - Current Recommendations  PT    Wound Plan  measure volume and wound site on Wednesdays.    Dressing   2 layers silverhydrofiber,2 layers of  alginate, ABD and profore     Manual Therapy  supraclavicular, deep and superfical abdominal followed by LE  Performed by Virgina Organ                PT Short Term Goals - 11/09/17 1225      PT SHORT TERM GOAL #1   Title  Pt Rt LE  volume to be decreased by 3 cm to allow improved environment for wound healing.     Time  3    Period  Weeks    Status  On-going      PT SHORT TERM GOAL #2   Title  Drainage from Rt LE wound to be minimal to reduce infection rate.     Time  3    Period  Weeks    Status  On-going      PT SHORT TERM GOAL #3   Title  PT to verbalize the importance of throwing compression garments out after a year in order to assure proper compression     Time  1    Period  Weeks    Status  Achieved      PT SHORT TERM GOAL #4   Title  Wound to be 100% granulated to allow proper healing     Time  3    Period  Weeks    Status   On-going        PT Long Term Goals - 11/09/17 1226      PT LONG TERM GOAL #1   Title  Pt volume in Rt LE  to be decreased by 6 cm to decrease pain level to no greater than a 2/10 .     Time  6    Period  Weeks    Status  On-going      PT LONG TERM GOAL #2   Title  PT wound drainage to be scant to reduce infection     Time  6    Period  Weeks    Status  On-going      PT LONG TERM GOAL #3   Title  Pt wound to be no greater size than 2cm diameter to allow pt to feel conficent in self care.     Time  8    Period  Weeks    Status  On-going              Patient will benefit from skilled therapeutic intervention in order to improve the  following deficits and impairments:     Visit Diagnosis: Lymphedema, not elsewhere classified  Open leg wound, right, subsequent encounter  Difficulty in walking, not elsewhere classified     Problem List Patient Active Problem List   Diagnosis Date Noted  . Severe aortic stenosis 08/09/2017  . Vitamin D deficiency 12/02/2016  . Family history of prostate cancer in father 11/03/2016  . Status post gastric bypass for obesity 11/02/2016  . Subdural hematoma (HCC) 08/26/2016  . Syncope 07/13/2016  . HTN (hypertension) 04/13/2013  . Hyperlipemia 04/13/2013  . Atrial fibrillation (HCC) 02/14/2013  . Acute myocardial infarction (HCC) 06/25/2008  . EXOGENOUS OBESITY 06/18/2008  . Obstructive sleep apnea 06/18/2008    Valentino Saxon, PT, DPT Physical Therapist with [facility]   [place], [place] [phone] 12/07/2017 12:44 PM     [facility] [address] [place], [name], [address] Phone: [phone]   Fax:  [phone]  Name: EMMONS TOTH MRN: [id number] Date of Birth: [birth year]

## 2017-12-08 NOTE — Telephone Encounter (Signed)
Call the wound center nurse and see if they are associated with a surgeon.  If not referred to Larae GroomsLindsey Bridges MD

## 2017-12-09 ENCOUNTER — Encounter (HOSPITAL_COMMUNITY): Payer: Self-pay | Admitting: Physical Therapy

## 2017-12-09 ENCOUNTER — Ambulatory Visit (HOSPITAL_COMMUNITY): Payer: Medicare Other | Admitting: Physical Therapy

## 2017-12-09 DIAGNOSIS — R262 Difficulty in walking, not elsewhere classified: Secondary | ICD-10-CM | POA: Diagnosis not present

## 2017-12-09 DIAGNOSIS — S81801D Unspecified open wound, right lower leg, subsequent encounter: Secondary | ICD-10-CM | POA: Diagnosis not present

## 2017-12-09 DIAGNOSIS — M79661 Pain in right lower leg: Secondary | ICD-10-CM | POA: Diagnosis not present

## 2017-12-09 DIAGNOSIS — I890 Lymphedema, not elsewhere classified: Secondary | ICD-10-CM

## 2017-12-09 NOTE — Therapy (Signed)
[facility] [address] [place], [name], [address] Phone: [phone]   Fax:  [phone]  Wound Care Therapy  Patient Details  Name: Joel Rogers MRN: [id number] Date of Birth: Apr 12, 1951 Referring Provider: Rica Mast   Encounter Date: 12/09/2017  PT End of Session - 12/09/17 1629    Visit Number  16    Number of Visits  20    Date for PT Re-Evaluation  -- recert on 1/24    Authorization Type  UHC Medicare    Authorization - Visit Number  16    Authorization - Number of Visits  20    PT Start Time  1350    PT Stop Time  1430    PT Time Calculation (min)  40 min    Activity Tolerance  Patient tolerated treatment well    Behavior During Therapy  [facility] for tasks assessed/performed       Past Medical History:  Diagnosis Date  . Arthritis   . Atrial fibrillation (HCC)   . Diabetes mellitus without complication (HCC)    borderline  . Hyperlipidemia   . Hypertension   . Myocardial infarction (HCC)   . Normal coronary arteries    by cardiac catheterization performed 03/14/06  . Obesity   . Sleep apnea    on CPAP  . Venous insufficiency     Past Surgical History:  Procedure Laterality Date  . CRANIOTOMY Right 08/27/2016   Procedure: CRANIOTOMY HEMATOMA EVACUATION SUBDURAL;  Surgeon: Maeola Harman, MD;  Location: [facility] OR;  Service: Neurosurgery;  Laterality: Right;  . GASTRIC BYPASS  09/2008  . JOINT REPLACEMENT  08/31/2006   right hip  . TOTAL HIP ARTHROPLASTY     right hip    There were no vitals filed for this visit.              Wound Therapy - 12/09/17 1625    Subjective  Pt states that he has not had any pain     Patient and Family Stated Goals  wound to healling    Date of Onset  10/19/17    Prior Treatments  self care     Pain Assessment  No/denies pain    Evaluation and Treatment Procedures Explained to Patient/Family  Yes    Evaluation and Treatment Procedures  agreed to    Wound Properties  Date First Assessed: 12/07/17 Time First Assessed: 1003 Wound Type: Other (Comment) Location: Leg Location Orientation: Right;Lower Wound Description (Comments): this wound is distal and central to previous 2 wounds Present on Admission: No   Dressing Type  Silver hydrofiber;Alginate;Compression wrap profore    Dressing Changed  Changed    Dressing Status  Old drainage    Dressing Change Frequency  PRN    Site / Wound Assessment  Red;Yellow    % Wound base Red or Granulating  50%    % Wound base Yellow/Fibrinous Exudate  50%    % Wound base Black/Eschar  0%    % Wound base Other/Granulation Tissue (Comment)  0%    Peri-wound Assessment  Maceration    Margins  Attached edges (approximated)    Drainage Amount  Copious    Drainage Description  Purulent    Treatment  Cleansed;Debridement (Selective)    Wound Properties Date First Assessed: 11/21/17 Time First Assessed: 0830 Wound Type: Venous stasis ulcer;Other (Comment) Location: Leg Location Orientation: Right;Lower;Lateral Wound Description (Comments): This area had been fabrile since initial evaluation but now open with a .  6 cm area in the distal wound that is draining purulent drainage.  This is connected underneath to the first wound as if you push on the anterior aspect of the leg increased purulent drainage comes out of the opening.  Present on Admission: -   Dressing Type  Compression wrap;Alginate;Silver hydrofiber profore    Dressing Changed  Changed    Dressing Status  Old drainage    Dressing Change Frequency  PRN    Site / Wound Assessment  Friable;Granulation tissue    % Wound base Red or Granulating  90%    % Wound base Yellow/Fibrinous Exudate  10%    % Wound base Black/Eschar  0%    % Wound base Other/Granulation Tissue (Comment)  0%    Peri-wound Assessment  Edema;Maceration    Drainage Amount  Copious    Drainage Description  Purulent    Treatment  Cleansed;Debridement (Selective)    Wound Properties Date First Assessed:  11/03/17 Time First Assessed: 1315 Wound Type: Venous stasis ulcer;Other (Comment) Location: Leg Location Orientation: Right;Anterior Present on Admission: Yes   Dressing Type  Alginate;Silver hydrofiber;Compression wrap profore    Dressing Changed  Changed    Dressing Status  Old drainage    Dressing Change Frequency  PRN    Site / Wound Assessment  Granulation tissue;Yellow    % Wound base Red or Granulating  50%    % Wound base Yellow/Fibrinous Exudate  50%    Peri-wound Assessment  Edema;Maceration    Drainage Amount  Copious    Drainage Description  Serous    Treatment  Cleansed;Debridement (Selective)    Selective Debridement - Location  all wounds    Selective Debridement - Tools Used  Forceps    Selective Debridement - Tissue Removed  slough and devitalized tissue    Wound Therapy - Clinical Statement  Pt continues to have copious drainage but is less purulent .  Pt had the first day of pain free since exacerbation of lymphedema.      Wound Therapy - Functional Problem List  Pain causing diffiiculty in walking     Factors Delaying/Impairing Wound Healing  Altered sensation;Vascular compromise    Hydrotherapy Plan  Debridement;Dressing change;Other (comment)    Wound Therapy - Frequency  3X / week    Wound Therapy - Current Recommendations  PT    Wound Plan  measure volume and wound site on Wednesdays.    Dressing   2 layers silverhydrofiber,3 layers of  alginate, ABD and profore     Manual Therapy  supraclavicular, deep and superfical abdominal followed by LE  Performed by Virgina Organ                PT Short Term Goals - 11/09/17 1225      PT SHORT TERM GOAL #1   Title  Pt Rt LE  volume to be decreased by 3 cm to allow improved environment for wound healing.     Time  3    Period  Weeks    Status  On-going      PT SHORT TERM GOAL #2   Title  Drainage from Rt LE wound to be minimal to reduce infection rate.     Time  3    Period  Weeks    Status  On-going       PT SHORT TERM GOAL #3   Title  PT to verbalize the importance of throwing compression garments out after a year in order to assure  proper compression     Time  1    Period  Weeks    Status  Achieved      PT SHORT TERM GOAL #4   Title  Wound to be 100% granulated to allow proper healing     Time  3    Period  Weeks    Status  On-going        PT Long Term Goals - 11/09/17 1226      PT LONG TERM GOAL #1   Title  Pt volume in Rt LE  to be decreased by 6 cm to decrease pain level to no greater than a 2/10 .     Time  6    Period  Weeks    Status  On-going      PT LONG TERM GOAL #2   Title  PT wound drainage to be scant to reduce infection     Time  6    Period  Weeks    Status  On-going      PT LONG TERM GOAL #3   Title  Pt wound to be no greater size than 2cm diameter to allow pt to feel conficent in self care.     Time  8    Period  Weeks    Status  On-going            Plan - 12/09/17 1632    Clinical Impression Statement  as above     Rehab Potential  Good    PT Frequency  2x / week    PT Duration  8 weeks    PT Treatment/Interventions  Patient/family education;Manual techniques;Manual lymph drainage;Compression bandaging;Other (comment) debridement and dressing change of wound     PT Next Visit Plan  Remeasure volume and wound on Wednesdays . Continue with debridement and manual techniques; no manual to Rt LE at this time.        Patient will benefit from skilled therapeutic intervention in order to improve the following deficits and impairments:  Abnormal gait, Decreased activity tolerance, Obesity, Pain, Increased edema, Impaired sensation, Difficulty walking  Visit Diagnosis: Lymphedema, not elsewhere classified  Open leg wound, right, subsequent encounter  Difficulty in walking, not elsewhere classified     Problem List Patient Active Problem List   Diagnosis Date Noted  . Severe aortic stenosis 08/09/2017  . Vitamin D deficiency 12/02/2016   . Family history of prostate cancer in father 11/03/2016  . Status post gastric bypass for obesity 11/02/2016  . Subdural hematoma (HCC) 08/26/2016  . Syncope 07/13/2016  . HTN (hypertension) 04/13/2013  . Hyperlipemia 04/13/2013  . Atrial fibrillation (HCC) 02/14/2013  . Acute myocardial infarction (HCC) 06/25/2008  . EXOGENOUS OBESITY 06/18/2008  . Obstructive sleep apnea 06/18/2008    Virgina Organynthia Artemis Koller, PT CLT [phone] 12/09/2017, 4:33 PM  [facility] [address] [place], [place], [address] Phone: [phone]   Fax:  [phone]  Name: Claudie LeachRalph L Bendorf MRN: [id number] Date of Birth: 1951-03-21

## 2017-12-12 ENCOUNTER — Encounter (HOSPITAL_COMMUNITY): Payer: Self-pay | Admitting: Physical Therapy

## 2017-12-12 ENCOUNTER — Ambulatory Visit (HOSPITAL_COMMUNITY): Payer: Medicare Other | Admitting: Physical Therapy

## 2017-12-12 DIAGNOSIS — M79661 Pain in right lower leg: Secondary | ICD-10-CM | POA: Diagnosis not present

## 2017-12-12 DIAGNOSIS — I890 Lymphedema, not elsewhere classified: Secondary | ICD-10-CM | POA: Diagnosis not present

## 2017-12-12 DIAGNOSIS — R262 Difficulty in walking, not elsewhere classified: Secondary | ICD-10-CM

## 2017-12-12 DIAGNOSIS — S81801D Unspecified open wound, right lower leg, subsequent encounter: Secondary | ICD-10-CM | POA: Diagnosis not present

## 2017-12-12 NOTE — Therapy (Signed)
[facility] [address] [place], [name], [address] Phone: [phone]   Fax:  [phone]  Wound Care Therapy  Patient Details  Name: Joel Rogers MRN: [id number] Date of Birth: [birth year] Referring Provider: Rica Mast   Encounter Date: 12/12/2017  PT End of Session - 12/12/17 1148    Visit Number  17    Number of Visits  20    Date for PT Re-Evaluation  -- recert on 1/24    Authorization Type  [employer] Medicare    Authorization - Visit Number  17    Authorization - Number of Visits  20    PT Start Time  0945    PT Stop Time  1035    PT Time Calculation (min)  50 min    Activity Tolerance  Patient tolerated treatment well    Behavior During Therapy  [facility] for tasks assessed/performed       Past Medical History:  Diagnosis Date  . Arthritis   . Atrial fibrillation (HCC)   . Diabetes mellitus without complication (HCC)    borderline  . Hyperlipidemia   . Hypertension   . Myocardial infarction (HCC)   . Normal coronary arteries    by cardiac catheterization performed 03/14/06  . Obesity   . Sleep apnea    on CPAP  . Venous insufficiency     Past Surgical History:  Procedure Laterality Date  . CRANIOTOMY Right 08/27/2016   Procedure: CRANIOTOMY HEMATOMA EVACUATION SUBDURAL;  Surgeon: Maeola Harman, MD;  Location: [facility] OR;  Service: Neurosurgery;  Laterality: Right;  . GASTRIC BYPASS  09/2008  . JOINT REPLACEMENT  08/31/2006   right hip  . TOTAL HIP ARTHROPLASTY     right hip    There were no vitals filed for this visit.              Wound Therapy - 12/12/17 1142    Subjective  Pt states that the pain continues to be gone.  States he feels that it is finally healing.    Patient and Family Stated Goals  wound to healling    Date of Onset  10/19/17    Prior Treatments  self care     Pain Assessment  No/denies pain    Evaluation and Treatment Procedures Explained to Patient/Family  Yes    Evaluation and  Treatment Procedures  agreed to    Wound Properties Date First Assessed: 12/07/17 Time First Assessed: 1003 Wound Type: Other (Comment) Location: Leg Location Orientation: Right;Lower Wound Description (Comments): this wound is distal and central to previous 2 wounds Present on Admission: No   Dressing Type  Silver hydrofiber;Alginate;Compression wrap profore    Dressing Changed  Changed    Dressing Status  Old drainage    Dressing Change Frequency  PRN    Site / Wound Assessment  Red;Yellow    % Wound base Red or Granulating  50%    % Wound base Yellow/Fibrinous Exudate  50%    % Wound base Black/Eschar  0%    % Wound base Other/Granulation Tissue (Comment)  0%    Peri-wound Assessment  Maceration    Margins  Attached edges (approximated)    Drainage Amount  Copious    Drainage Description  Purulent    Treatment  Cleansed;Debridement (Selective)    Wound Properties Date First Assessed: 11/21/17 Time First Assessed: 0830 Wound Type: Venous stasis ulcer;Other (Comment) Location: Leg Location Orientation: Right;Lower;Lateral Wound Description (Comments): This area had been fabrile  since initial evaluation but now open with a .6 cm area in the distal wound that is draining purulent drainage.  This is connected underneath to the first wound as if you push on the anterior aspect of the leg increased purulent drainage comes out of the opening.  Present on Admission: -   Dressing Type  Compression wrap;Alginate;Silver hydrofiber profore    Dressing Changed  Changed    Dressing Status  Old drainage    Dressing Change Frequency  PRN    Site / Wound Assessment  Friable;Granulation tissue    % Wound base Red or Granulating  90%    % Wound base Yellow/Fibrinous Exudate  10%    % Wound base Black/Eschar  0%    % Wound base Other/Granulation Tissue (Comment)  0%    Peri-wound Assessment  Edema;Maceration    Drainage Amount  Copious    Drainage Description  Purulent    Treatment  Cleansed;Debridement  (Selective);Other (Comment)    Wound Properties Date First Assessed: 11/03/17 Time First Assessed: 1315 Wound Type: Venous stasis ulcer;Other (Comment) Location: Leg Location Orientation: Right;Anterior Present on Admission: Yes   Dressing Type  Alginate;Silver hydrofiber;Compression wrap profore    Dressing Changed  Changed    Dressing Status  Old drainage    Dressing Change Frequency  PRN    Site / Wound Assessment  Granulation tissue;Yellow    % Wound base Red or Granulating  60%    % Wound base Yellow/Fibrinous Exudate  40%    Peri-wound Assessment  Edema;Maceration    Margins  Epibole (rolled edges)    Drainage Amount  Copious    Drainage Description  Serous    Treatment  Cleansed;Debridement (Selective)    Selective Debridement - Location  all wounds    Selective Debridement - Tools Used  Forceps;Scissors    Selective Debridement - Tissue Removed  slough and devitalized tissue    Wound Therapy - Clinical Statement  Unable to extrace purulent drainage from sinus area along lateral wound.  Pt  anterior wound has noted increased granulation but edges are beginning to epibole.  Therapist debrided epiboled edges.      Wound Therapy - Functional Problem List  Pain causing diffiiculty in walking     Factors Delaying/Impairing Wound Healing  Altered sensation;Vascular compromise    Hydrotherapy Plan  Debridement;Dressing change;Other (comment)    Wound Therapy - Frequency  3X / week    Wound Therapy - Current Recommendations  PT    Wound Plan  measure volume and wound site on Wednesdays.    Dressing   2 layers silverhydrofiber,3 layers of  alginate, ABD and profore     Manual Therapy  supraclavicular, deep and superfical abdominal followed by LE  Performed by Virgina Organynthia Russell                PT Short Term Goals - 11/09/17 1225      PT SHORT TERM GOAL #1   Title  Pt Rt LE  volume to be decreased by 3 cm to allow improved environment for wound healing.     Time  3    Period   Weeks    Status  On-going      PT SHORT TERM GOAL #2   Title  Drainage from Rt LE wound to be minimal to reduce infection rate.     Time  3    Period  Weeks    Status  On-going      PT SHORT  TERM GOAL #3   Title  PT to verbalize the importance of throwing compression garments out after a year in order to assure proper compression     Time  1    Period  Weeks    Status  Achieved      PT SHORT TERM GOAL #4   Title  Wound to be 100% granulated to allow proper healing     Time  3    Period  Weeks    Status  On-going        PT Long Term Goals - 11/09/17 1226      PT LONG TERM GOAL #1   Title  Pt volume in Rt LE  to be decreased by 6 cm to decrease pain level to no greater than a 2/10 .     Time  6    Period  Weeks    Status  On-going      PT LONG TERM GOAL #2   Title  PT wound drainage to be scant to reduce infection     Time  6    Period  Weeks    Status  On-going      PT LONG TERM GOAL #3   Title  Pt wound to be no greater size than 2cm diameter to allow pt to feel conficent in self care.     Time  8    Period  Weeks    Status  On-going            Plan - 12/12/17 1148    Rehab Potential  Good    PT Frequency  2x / week    PT Duration  8 weeks    PT Treatment/Interventions  Patient/family education;Manual techniques;Manual lymph drainage;Compression bandaging;Other (comment) debridement and dressing change of wound     PT Next Visit Plan  Remeasure volume and wound on Wednesdays . Continue with debridement and manual techniques; no manual to Rt LE at this time.        Patient will benefit from skilled therapeutic intervention in order to improve the following deficits and impairments:  Abnormal gait, Decreased activity tolerance, Obesity, Pain, Increased edema, Impaired sensation, Difficulty walking  Visit Diagnosis: Lymphedema, not elsewhere classified  Open leg wound, right, subsequent encounter  Difficulty in walking, not elsewhere  classified     Problem List Patient Active Problem List   Diagnosis Date Noted  . Severe aortic stenosis 08/09/2017  . Vitamin D deficiency 12/02/2016  . Family history of prostate cancer in father 11/03/2016  . Status post gastric bypass for obesity 11/02/2016  . Subdural hematoma (HCC) 08/26/2016  . Syncope 07/13/2016  . HTN (hypertension) 04/13/2013  . Hyperlipemia 04/13/2013  . Atrial fibrillation (HCC) 02/14/2013  . Acute myocardial infarction (HCC) 06/25/2008  . EXOGENOUS OBESITY 06/18/2008  . Obstructive sleep apnea 06/18/2008    Virgina Organ, PT CLT [phone] 12/12/2017, 11:49 AM  [facility] [address] [place], [name], [address] Phone: [phone]   Fax:  [phone]  Name: Joel Rogers MRN: [id number] Date of Birth: 15-May-1951

## 2017-12-13 ENCOUNTER — Other Ambulatory Visit: Payer: Self-pay

## 2017-12-13 ENCOUNTER — Ambulatory Visit (INDEPENDENT_AMBULATORY_CARE_PROVIDER_SITE_OTHER): Payer: Medicare Other | Admitting: Family Medicine

## 2017-12-13 ENCOUNTER — Encounter: Payer: Self-pay | Admitting: Family Medicine

## 2017-12-13 VITALS — BP 168/90 | HR 82 | Temp 98.1°F | Resp 16 | Ht 72.0 in | Wt 321.2 lb

## 2017-12-13 DIAGNOSIS — T148XXA Other injury of unspecified body region, initial encounter: Secondary | ICD-10-CM

## 2017-12-13 DIAGNOSIS — L24A9 Irritant contact dermatitis due friction or contact with other specified body fluids: Secondary | ICD-10-CM

## 2017-12-13 DIAGNOSIS — I10 Essential (primary) hypertension: Secondary | ICD-10-CM | POA: Diagnosis not present

## 2017-12-13 DIAGNOSIS — I872 Venous insufficiency (chronic) (peripheral): Secondary | ICD-10-CM

## 2017-12-13 DIAGNOSIS — L97819 Non-pressure chronic ulcer of other part of right lower leg with unspecified severity: Secondary | ICD-10-CM

## 2017-12-13 LAB — BASIC METABOLIC PANEL WITH GFR
BUN: 19 mg/dL (ref 7–25)
CALCIUM: 9.1 mg/dL (ref 8.6–10.3)
CO2: 28 mmol/L (ref 20–32)
Chloride: 105 mmol/L (ref 98–110)
Creat: 1.19 mg/dL (ref 0.70–1.25)
GFR, EST NON AFRICAN AMERICAN: 63 mL/min/{1.73_m2} (ref >=60)
GFR, Est African American: 73 mL/min/{1.73_m2} (ref >=60)
GLUCOSE: 99 mg/dL (ref 65–99)
Potassium: 4 mmol/L (ref 3.5–5.3)
SODIUM: 141 mmol/L (ref 135–146)

## 2017-12-13 MED ORDER — TORSEMIDE 20 MG PO TABS: 20.0000 mg | ORAL_TABLET | Freq: Every day | ORAL | 3 refills | Status: DC

## 2017-12-13 NOTE — Progress Notes (Signed)
Chief Complaint  Patient presents with  . Follow-up  . Medication Management    patient states he is supposed to be taking torsemide BID, but instructions only say once a day.    Patient is here for follow-up. His blood pressure is well controlled. He states that his torsemide was increased to twice daily, but he has a prescription that illnesses once a day.  He keeps running out early.  I sent a new prescription to his pharmacy. Is because he is taking a lot of torsemide, I am going to have him get a potassium check today. He is worried about his venous stasis ulcer.  He does not look any better to him.  In addition, he now has a sinus tract that is deep.  I have a lot of drainage.  I have spoken to the physical therapist who does his dressing changes and Unna boot.  Going to get a surgical consult.  Patient Active Problem List   Diagnosis Date Noted  . Severe aortic stenosis 08/09/2017  . Vitamin D deficiency 12/02/2016  . Family history of prostate cancer in father 11/03/2016  . Status post gastric bypass for obesity 11/02/2016  . Subdural hematoma (HCC) 08/26/2016  . Syncope 07/13/2016  . HTN (hypertension) 04/13/2013  . Hyperlipemia 04/13/2013  . Atrial fibrillation (HCC) 02/14/2013  . Acute myocardial infarction (HCC) 06/25/2008  . EXOGENOUS OBESITY 06/18/2008  . Obstructive sleep apnea 06/18/2008    Outpatient Encounter Medications as of 12/13/2017  Medication Sig  . aspirin EC 81 MG tablet Take 1 tablet (81 mg total) by mouth daily.  Marland Kitchen atorvastatin (LIPITOR) 80 MG tablet TAKE ONE TABLET BY MOUTH ONCE DAILY WITH BREAKFAST  . Calcium Carbonate-Vitamin D (CALCIUM + D PO) Take 1 tablet by mouth daily.  . colchicine 0.6 MG tablet Take 1 tablet (0.6 mg total) by mouth 2 (two) times daily.  Marland Kitchen ibuprofen (ADVIL,MOTRIN) 800 MG tablet TAKE 1 TABLET BY MOUTH EVERY 8 HOURS AS NEEDED FOR MODERATE PAIN  . losartan (COZAAR) 50 MG tablet Take 1 tablet (50 mg total) by mouth daily.  .  metoprolol (LOPRESSOR) 100 MG tablet Take 1 tablet (100 mg total) by mouth 2 (two) times daily. (Patient taking differently: Take 50 mg by mouth 2 (two) times daily. )  . sulfamethoxazole-trimethoprim (BACTRIM DS,SEPTRA DS) 800-160 MG tablet TAKE 1 TABLET BY MOUTH TWICE DAILY FOR 5 DAYS  . tadalafil (CIALIS) 20 MG tablet Take 1 tablet (20 mg total) by mouth daily as needed for erectile dysfunction.  . Vitamin D, Ergocalciferol, (DRISDOL) 50000 units CAPS capsule TAKE ONE CAPSULE BY MOUTH EVERY 7 DAYS  . [DISCONTINUED] torsemide (DEMADEX) 20 MG tablet Take 1 tablet (20 mg total) by mouth daily.  Marland Kitchen torsemide (DEMADEX) 20 MG tablet Take 1 tablet (20 mg total) by mouth daily.   No facility-administered encounter medications on file as of 12/13/2017.     No Known Allergies  Review of Systems  Constitutional: Positive for unexpected weight change. Negative for chills and fever.  Cardiovascular: Positive for leg swelling.  Skin: Positive for wound. Negative for color change and rash.  All other systems reviewed and are negative.   BP (!) 168/90 (BP Location: Left Arm, Patient Position: Sitting, Cuff Size: Normal)   Pulse 82   Temp 98.1 F (36.7 C) (Temporal)   Resp 16   Ht 6' (1.829 m)   Wt (!) 321 lb 4 oz (145.7 kg)   SpO2 97%   BMI 43.57 kg/m  Physical Exam  Constitutional: He is oriented to person, place, and time. He appears well-developed and well-nourished.  Morbid obesity  HENT:  Head: Normocephalic and atraumatic.  Mouth/Throat: Oropharynx is clear and moist.  Cardiovascular: Normal rate, regular rhythm and normal heart sounds.  Pulmonary/Chest: Effort normal and breath sounds normal. No respiratory distress.  Musculoskeletal: He exhibits edema.  Right leg is in an Radio broadcast assistantUnna boot.  No inguinal adenopathy.  Dressing is not removed for examination.  Neurological: He is alert and oriented to person, place, and time.  Psychiatric: He has a normal mood and affect. His behavior is  normal.    ASSESSMENT/PLAN:  1. Drainage from wound New development of sinus tract - Ambulatory referral to General Surgery  2. Venous stasis ulcer of other part of right lower leg without varicose veins, unspecified ulcer stage (HCC) Chronic - Ambulatory referral to General Surgery  3. Essential hypertension Controlled - BASIC METABOLIC PANEL WITH GFR   Patient Instructions  I will see about a surgeon Dr Henreitta LeberBridges is a good choice and I will call her See if your therapist can photo the wound for me I have sent a prescription for new torsemide Take 2 x a day Go today for a blood test to check your potassium  See me every 3 months Call sooner for problems   Eustace MooreYvonne Sue Oneal Schoenberger, MD

## 2017-12-13 NOTE — Patient Instructions (Signed)
I will see about a surgeon Dr Henreitta LeberBridges is a good choice and I will call her See if your therapist can photo the wound for me I have sent a prescription for new torsemide Take 2 x a day Go today for a blood test to check your potassium  See me every 3 months Call sooner for problems

## 2017-12-14 ENCOUNTER — Encounter (HOSPITAL_COMMUNITY): Payer: Self-pay | Admitting: Physical Therapy

## 2017-12-14 ENCOUNTER — Ambulatory Visit (HOSPITAL_COMMUNITY): Payer: Medicare Other | Admitting: Physical Therapy

## 2017-12-14 DIAGNOSIS — S81801D Unspecified open wound, right lower leg, subsequent encounter: Secondary | ICD-10-CM | POA: Diagnosis not present

## 2017-12-14 DIAGNOSIS — R262 Difficulty in walking, not elsewhere classified: Secondary | ICD-10-CM | POA: Diagnosis not present

## 2017-12-14 DIAGNOSIS — I890 Lymphedema, not elsewhere classified: Secondary | ICD-10-CM | POA: Diagnosis not present

## 2017-12-14 DIAGNOSIS — M79661 Pain in right lower leg: Secondary | ICD-10-CM

## 2017-12-14 NOTE — Therapy (Signed)
[facility] [address] [place], [name], [address] Phone: [phone]   Fax:  [phone]  Wound Care Therapy  Patient Details  Name: Joel Rogers MRN: [id number] Date of Birth: 1951/05/24 Referring Provider: Rica Mast   Encounter Date: 12/14/2017  PT End of Session - 12/14/17 1104    Visit Number  18    Number of Visits  30    Date for PT Re-Evaluation  -- recert on 1/24    Authorization Type  [employer] Medicare: recert on 2/23    Authorization - Visit Number  18    Authorization - Number of Visits  30    PT Start Time  0945    PT Stop Time  1045    PT Time Calculation (min)  60 min    Activity Tolerance  Patient tolerated treatment well    Behavior During Therapy  [facility] for tasks assessed/performed       Past Medical History:  Diagnosis Date  . Arthritis   . Atrial fibrillation (HCC)   . Diabetes mellitus without complication (HCC)    borderline  . Hyperlipidemia   . Hypertension   . Myocardial infarction (HCC)   . Normal coronary arteries    by cardiac catheterization performed 03/14/06  . Obesity   . Sleep apnea    on CPAP  . Venous insufficiency     Past Surgical History:  Procedure Laterality Date  . CRANIOTOMY Right 08/27/2016   Procedure: CRANIOTOMY HEMATOMA EVACUATION SUBDURAL;  Surgeon: Maeola Harman, MD;  Location: [facility] OR;  Service: Neurosurgery;  Laterality: Right;  . GASTRIC BYPASS  09/2008  . JOINT REPLACEMENT  08/31/2006   right hip  . TOTAL HIP ARTHROPLASTY     right hip    There were no vitals filed for this visit.        LYMPHEDEMA/ONCOLOGY QUESTIONNAIRE - 12/14/17 1004      Right Lower Extremity Lymphedema   30 cm Proximal to Floor at Lateral Plantar Foot  43.7 cm    20 cm Proximal to Floor at Lateral Plantar Foot  32.2 1    10  cm Proximal to Floor at Lateral Malleoli  26.2 cm    Circumference of ankle/heel  35.6 cm.    5 cm Proximal to 1st MTP Joint  25.9 cm    Across MTP Joint  27.3  cm           Wound Therapy - 12/14/17 1054    Subjective  PT states pain was up to a 9/10 today at times; but states that he 1/2 his pain medication and stood up on his leg to make dinner.  Pain is a  throbbing, aching; saw doctor yesterday; doctor wants picture of wound to determine if might be infection in sinus tracts    Patient and Family Stated Goals  wound to healling    Date of Onset  10/19/17    Prior Treatments  self care     Pain Assessment  0-10    Pain Score  4     Pain Type  Acute pain    Pain Location  Leg    Pain Descriptors / Indicators  Throbbing    Patients Stated Pain Goal  0    Pain Intervention(s)  Emotional support    Evaluation and Treatment Procedures Explained to Patient/Family  Yes    Evaluation and Treatment Procedures  agreed to    Wound Properties Date First Assessed: 12/07/17 Time First Assessed:  1003 Wound Type: Other (Comment) Location: Leg Location Orientation: Right;Lower Wound Description (Comments): this wound is distal and central to previous 2 wounds Present on Admission: No   Dressing Type  Silver hydrofiber;Alginate;Compression wrap profore    Dressing Changed  Changed    Dressing Status  Old drainage    Dressing Change Frequency  PRN    Site / Wound Assessment  Red;Yellow    % Wound base Red or Granulating  50%    % Wound base Yellow/Fibrinous Exudate  50%    % Wound base Black/Eschar  0%    % Wound base Other/Granulation Tissue (Comment)  0%    Peri-wound Assessment  Maceration    Wound Length (cm)  0.6 cm was .8    Wound Width (cm)  0.9 cm was 1.0    Wound Surface Area (cm^2)  0.54 cm^2    Margins  Attached edges (approximated)    Drainage Amount  Moderate    Drainage Description  Serosanguineous    Treatment  Cleansed;Debridement (Selective)    Wound Properties Date First Assessed: 11/21/17 Time First Assessed: 0830 Wound Type: Venous stasis ulcer;Other (Comment) Location: Leg Location Orientation: Right;Lower;Lateral Wound  Description (Comments): This area had been fabrile since initial evaluation but now open with a .6 cm area in the distal wound that is draining purulent drainage.  This is connected underneath to the first wound as if you push on the anterior aspect of the leg increased purulent drainage comes out of the opening.  Present on Admission: -   Dressing Type  Compression wrap;Alginate;Silver hydrofiber profore    Dressing Changed  Changed    Dressing Status  Old drainage    Dressing Change Frequency  PRN    Site / Wound Assessment  Friable;Granulation tissue    % Wound base Red or Granulating  90%    % Wound base Yellow/Fibrinous Exudate  10%    % Wound base Black/Eschar  0%    % Wound base Other/Granulation Tissue (Comment)  0%    Peri-wound Assessment  Edema;Maceration    Wound Length (cm)  3 cm was 4.8    Wound Width (cm)  1.8 cm was 1.8    Wound Surface Area (cm^2)  5.4 cm^2    Drainage Amount  Moderate    Drainage Description  Serous;Purulent    Treatment  Cleansed;Debridement (Selective)    Wound Properties Date First Assessed: 11/03/17 Time First Assessed: 1315 Wound Type: Venous stasis ulcer;Other (Comment) Location: Leg Location Orientation: Right;Anterior Present on Admission: Yes   Dressing Type  Alginate;Silver hydrofiber;Compression wrap profore    Dressing Changed  Changed    Dressing Status  Old drainage    Dressing Change Frequency  PRN    Site / Wound Assessment  Granulation tissue;Yellow    % Wound base Red or Granulating  70%    % Wound base Yellow/Fibrinous Exudate  30%    Peri-wound Assessment  Edema;Maceration    Wound Length (cm)  3.9 cm was 4    Wound Width (cm)  1.8 cm was2.2    Wound Depth (cm)  0.8 cm    Wound Volume (cm^3)  5.62 cm^3    Wound Surface Area (cm^2)  7.02 cm^2    Margins  Epibole (rolled edges)    Drainage Amount  Copious    Drainage Description  Serous    Treatment  Cleansed;Debridement (Selective)    Selective Debridement - Location  all wounds     Selective Debridement - Tools  Used  Forceps;Scissors    Selective Debridement - Tissue Removed  slough and devitalized tissue    Wound Therapy - Clinical Statement  Lateral wound drainageis now sangiserous.  Overall all wounds have decreased in size and improved in granulation.  Pt increased pain is most probably due to being up on his feet more than usual and pt halved his pain medication.      Wound Therapy - Functional Problem List  Pain causing diffiiculty in walking     Factors Delaying/Impairing Wound Healing  Altered sensation;Vascular compromise    Hydrotherapy Plan  Debridement;Dressing change;Other (comment)    Wound Therapy - Frequency  3X / week    Wound Therapy - Current Recommendations  PT    Wound Plan  measure volume and wound site on Wednesdays.    Dressing   2 layers silverhydrofiber,32layers of  alginate, ABD and profore     Manual Therapy  supraclavicular, deep and superfical abdominal followed by LE  Performed by Virgina Organ                PT Short Term Goals - 11/09/17 1225      PT SHORT TERM GOAL #1   Title  Pt Rt LE  volume to be decreased by 3 cm to allow improved environment for wound healing.     Time  3    Period  Weeks    Status  On-going      PT SHORT TERM GOAL #2   Title  Drainage from Rt LE wound to be minimal to reduce infection rate.     Time  3    Period  Weeks    Status  On-going      PT SHORT TERM GOAL #3   Title  PT to verbalize the importance of throwing compression garments out after a year in order to assure proper compression     Time  1    Period  Weeks    Status  Achieved      PT SHORT TERM GOAL #4   Title  Wound to be 100% granulated to allow proper healing     Time  3    Period  Weeks    Status  On-going        PT Long Term Goals - 11/09/17 1226      PT LONG TERM GOAL #1   Title  Pt volume in Rt LE  to be decreased by 6 cm to decrease pain level to no greater than a 2/10 .     Time  6    Period  Weeks     Status  On-going      PT LONG TERM GOAL #2   Title  PT wound drainage to be scant to reduce infection     Time  6    Period  Weeks    Status  On-going      PT LONG TERM GOAL #3   Title  Pt wound to be no greater size than 2cm diameter to allow pt to feel conficent in self care.     Time  8    Period  Weeks    Status  On-going            Plan - 12/14/17 1105    Clinical Impression Statement  as above     Rehab Potential  Good    PT Frequency  2x / week    PT Duration  12 weeks additional 4  weeks from first cert.     PT Treatment/Interventions  Patient/family education;Manual techniques;Manual lymph drainage;Compression bandaging;Other (comment) debridement and dressing change of wound     PT Next Visit Plan  Remeasure volume and wound on Wednesdays . Continue with debridement and manual techniques; no manual to Rt LE at this time.        Patient will benefit from skilled therapeutic intervention in order to improve the following deficits and impairments:  Abnormal gait, Decreased activity tolerance, Obesity, Pain, Increased edema, Impaired sensation, Difficulty walking  Visit Diagnosis: Lymphedema, not elsewhere classified  Open leg wound, right, subsequent encounter  Difficulty in walking, not elsewhere classified  Pain in right lower leg     Problem List Patient Active Problem List   Diagnosis Date Noted  . Severe aortic stenosis 08/09/2017  . Vitamin D deficiency 12/02/2016  . Family history of prostate cancer in father 11/03/2016  . Status post gastric bypass for obesity 11/02/2016  . Subdural hematoma (HCC) 08/26/2016  . Syncope 07/13/2016  . HTN (hypertension) 04/13/2013  . Hyperlipemia 04/13/2013  . Atrial fibrillation (HCC) 02/14/2013  . Acute myocardial infarction (HCC) 06/25/2008  . EXOGENOUS OBESITY 06/18/2008  . Obstructive sleep apnea 06/18/2008   Virgina Organynthia Russell, PT CLT [phone] 12/14/2017, 11:09 AM  [facility] [address] [place], [place], [address] Phone: [phone]   Fax:  [phone]  Name: Joel Rogers MRN: [id number] Date of Birth: 07/11/1951

## 2017-12-16 ENCOUNTER — Ambulatory Visit (HOSPITAL_COMMUNITY): Payer: Medicare Other

## 2017-12-16 ENCOUNTER — Encounter (HOSPITAL_COMMUNITY): Payer: Self-pay

## 2017-12-16 DIAGNOSIS — R262 Difficulty in walking, not elsewhere classified: Secondary | ICD-10-CM | POA: Diagnosis not present

## 2017-12-16 DIAGNOSIS — M79661 Pain in right lower leg: Secondary | ICD-10-CM

## 2017-12-16 DIAGNOSIS — S81801D Unspecified open wound, right lower leg, subsequent encounter: Secondary | ICD-10-CM | POA: Diagnosis not present

## 2017-12-16 DIAGNOSIS — I890 Lymphedema, not elsewhere classified: Secondary | ICD-10-CM | POA: Diagnosis not present

## 2017-12-16 NOTE — Therapy (Signed)
[facility] [address] [place], [place], [address] Phone: [phone]   Fax:  [phone]  Wound Care Therapy  Patient Details  Name: Joel Rogers MRN: [id number] Date of Birth: 1951-10-30 Referring Provider: [name]    Encounter Date: 12/16/2017  PT End of Session - 12/16/17 1235    Visit Number  19    Number of Visits  30    Date for PT Re-Evaluation  01/12/18    Authorization Type  [employer] Medicare: recert on 1/23-2/21/19    Authorization - Visit Number  19    Authorization - Number of Visits  30    PT Start Time  0950    PT Stop Time  1035    PT Time Calculation (min)  45 min    Activity Tolerance  Patient tolerated treatment well    Behavior During Therapy  [facility] for tasks assessed/performed       Past Medical History:  Diagnosis Date  . Arthritis   . Atrial fibrillation (HCC)   . Diabetes mellitus without complication (HCC)    borderline  . Hyperlipidemia   . Hypertension   . Myocardial infarction (HCC)   . Normal coronary arteries    by cardiac catheterization performed 03/14/06  . Obesity   . Sleep apnea    on CPAP  . Venous insufficiency     Past Surgical History:  Procedure Laterality Date  . CRANIOTOMY Right 08/27/2016   Procedure: CRANIOTOMY HEMATOMA EVACUATION SUBDURAL;  Surgeon: Maeola HarmanJoseph Stern, MD;  [facility];  Service: Neurosurgery;  Laterality: Right;  . GASTRIC BYPASS  09/2008  . JOINT REPLACEMENT  08/31/2006   right hip  . TOTAL HIP ARTHROPLASTY     right hip    There were no vitals filed for this visit.   Subjective Assessment - 12/16/17 1233    Subjective  Pt stated he has burning on wound bed, intermittent pain 9/10.  Arrived with dressing intact    Currently in Pain?  Yes    Pain Score  9     Pain Location  Leg    Pain Orientation  Right    Pain Descriptors / Indicators  Burning    Pain Type  Acute pain    Pain Onset  1 to 4 weeks ago    Pain Frequency  Intermittent    Aggravating Factors   edema    Pain Relieving Factors  elevating leg    Effect of Pain on Daily Activities  increase pain                Wound Therapy - 12/16/17 1233    Subjective  Pt stated he has burning on wound bed, intermittent pain 9/10.  Arrived with dressing intact    Patient and Family Stated Goals  wound to healling    Date of Onset  10/19/17    Prior Treatments  self care     Pain Assessment  0-10    Patients Stated Pain Goal  0    Pain Intervention(s)  Emotional support;Repositioned    Evaluation and Treatment Procedures Explained to Patient/Family  Yes    Evaluation and Treatment Procedures  agreed to    Wound Properties Date First Assessed: 12/07/17 Time First Assessed: 1003 Wound Type: Other (Comment) Location: Leg Location Orientation: Right;Lower Wound Description (Comments): this wound is distal and central to previous 2 wounds Present on Admission: No   Dressing Type  Silver hydrofiber;Alginate;Compression wrap profore with extra cotton  Dressing Changed  Changed    Dressing Status  Old drainage    Dressing Change Frequency  PRN    Site / Wound Assessment  Red;Yellow    % Wound base Red or Granulating  50%    % Wound base Yellow/Fibrinous Exudate  50%    % Wound base Black/Eschar  0%    Peri-wound Assessment  Maceration    Wound Length (cm)  0.6 cm was .6    Wound Width (cm)  0.7 cm was .9    Wound Depth (cm)  0 cm    Wound Volume (cm^3)  0 cm^3    Wound Surface Area (cm^2)  0.42 cm^2    Margins  Attached edges (approximated)    Drainage Amount  Moderate    Drainage Description  Serosanguineous    Treatment  Cleansed;Debridement (Selective) manual retro massage    Wound Properties Date First Assessed: 11/21/17 Time First Assessed: 0830 Wound Type: Venous stasis ulcer;Other (Comment) Location: Leg Location Orientation: Right;Lower;Lateral Wound Description (Comments): This area had been fabrile since initial evaluation but now open with a .6 cm area in  the distal wound that is draining purulent drainage.  This is connected underneath to the first wound as if you push on the anterior aspect of the leg increased purulent drainage comes out of the opening.  Present on Admission: -   Dressing Type  Compression wrap;Alginate;Silver hydrofiber profore with extra cotton    Dressing Changed  Changed    Dressing Status  Old drainage    Dressing Change Frequency  PRN    Site / Wound Assessment  Friable;Granulation tissue    % Wound base Red or Granulating  95%    % Wound base Yellow/Fibrinous Exudate  5%    % Wound base Black/Eschar  0%    Wound Length (cm)  2.7 cm was 3    Wound Width (cm)  1.6 cm was 1.8    Wound Depth (cm)  0 cm    Wound Volume (cm^3)  0 cm^3    Wound Surface Area (cm^2)  4.32 cm^2    Drainage Amount  Moderate    Drainage Description  Serous;Purulent    Treatment  Cleansed;Debridement (Selective) manual retro massage    Wound Properties Date First Assessed: 11/03/17 Time First Assessed: 1315 Wound Type: Venous stasis ulcer;Other (Comment) Location: Leg Location Orientation: Right;Anterior Present on Admission: Yes   Dressing Type  Alginate;Silver hydrofiber;Compression wrap profore with extra cotton    Dressing Changed  Changed    Dressing Status  Old drainage    Dressing Change Frequency  PRN    Site / Wound Assessment  Granulation tissue;Yellow    % Wound base Red or Granulating  70%    % Wound base Yellow/Fibrinous Exudate  30%    Peri-wound Assessment  Edema;Maceration    Wound Length (cm)  3.6 cm was 3.9    Wound Width (cm)  1.8 cm was 1.8    Wound Depth (cm)  0.6 cm    Wound Volume (cm^3)  3.89 cm^3    Wound Surface Area (cm^2)  6.48 cm^2    Margins  Epibole (rolled edges)    Drainage Amount  Copious    Drainage Description  Serous    Treatment  Cleansed;Debridement (Selective)    Selective Debridement - Location  all wounds    Selective Debridement - Tools Used  Forceps;Scissors    Selective Debridement -  Tissue Removed  slough and devitalized tissue  Wound Therapy - Clinical Statement  Wounds progressing well with improved granulation tissue and decreased depth in center wound.  Continues to have maceration proximal wounds.  Retro massage complete for edema control.  Continued wiht silverhydrofiber and alginate to address drainage and profore for edema control.    Wound Therapy - Functional Problem List  Pain causing diffiiculty in walking     Factors Delaying/Impairing Wound Healing  Altered sensation;Vascular compromise    Hydrotherapy Plan  Debridement;Dressing change;Other (comment)    Wound Therapy - Frequency  3X / week    Wound Therapy - Current Recommendations  PT    Wound Plan  measure volume and wound site on Wednesdays.    Dressing   2 layers silverhydrofiber,32layers of  alginate, ABD and profore                 PT Short Term Goals - 12/14/17 1109      PT SHORT TERM GOAL #1   Title  Pt Rt LE  volume to be decreased by 3 cm to allow improved environment for wound healing.     Time  3    Period  Weeks    Status  On-going      PT SHORT TERM GOAL #2   Title  Drainage from Rt LE wound to be minimal to reduce infection rate.     Time  3    Period  Weeks    Status  On-going      PT SHORT TERM GOAL #3   Title  PT to verbalize the importance of throwing compression garments out after a year in order to assure proper compression     Time  1    Period  Weeks    Status  Achieved      PT SHORT TERM GOAL #4   Title  Wound to be 100% granulated to allow proper healing     Time  3    Period  Weeks    Status  On-going        PT Long Term Goals - 12/14/17 1110      PT LONG TERM GOAL #1   Title  Pt volume in Rt LE  to be decreased by 6 cm to decrease pain level to no greater than a 2/10 .     Time  6    Period  Weeks    Status  On-going      PT LONG TERM GOAL #2   Title  PT wound drainage to be scant to reduce infection     Time  6    Period  Weeks    Status   On-going      PT LONG TERM GOAL #3   Title  Pt wound to be no greater size than 2cm diameter to allow pt to feel conficent in self care.     Time  8    Period  Weeks    Status  On-going              Patient will benefit from skilled therapeutic intervention in order to improve the following deficits and impairments:     Visit Diagnosis: Lymphedema, not elsewhere classified  Open leg wound, right, subsequent encounter  Difficulty in walking, not elsewhere classified  Pain in right lower leg     Problem List Patient Active Problem List   Diagnosis Date Noted  . Severe aortic stenosis 08/09/2017  . Vitamin D deficiency 12/02/2016  . Family history  of prostate cancer in father 11/03/2016  . Status post gastric bypass for obesity 11/02/2016  . Subdural hematoma (HCC) 08/26/2016  . Syncope 07/13/2016  . HTN (hypertension) 04/13/2013  . Hyperlipemia 04/13/2013  . Atrial fibrillation (HCC) 02/14/2013  . Acute myocardial infarction (HCC) 06/25/2008  . EXOGENOUS OBESITY 06/18/2008  . Obstructive sleep apnea 06/18/2008   Becky Sax, LPTA; CBIS [phone]  Juel Burrow 12/16/2017, 1:10 PM  [facility] [address] [place], [name], [address] Phone: [phone]   Fax:  [phone]  Name: Joel Rogers MRN: [id number] Date of Birth: 12-Oct-1951

## 2017-12-19 ENCOUNTER — Other Ambulatory Visit: Payer: Self-pay

## 2017-12-19 ENCOUNTER — Ambulatory Visit (HOSPITAL_COMMUNITY): Payer: Medicare Other | Admitting: Physical Therapy

## 2017-12-19 DIAGNOSIS — S81801D Unspecified open wound, right lower leg, subsequent encounter: Secondary | ICD-10-CM

## 2017-12-19 DIAGNOSIS — M79661 Pain in right lower leg: Secondary | ICD-10-CM

## 2017-12-19 DIAGNOSIS — I890 Lymphedema, not elsewhere classified: Secondary | ICD-10-CM

## 2017-12-19 DIAGNOSIS — R262 Difficulty in walking, not elsewhere classified: Secondary | ICD-10-CM

## 2017-12-19 NOTE — Therapy (Signed)
[facility] [address] [place], [place], [address] Phone: [phone]   Fax:  [phone]  Wound Care Therapy  Patient Details  Name: Joel Rogers MRN: [id number] Date of Birth: [birth year] Referring Provider: [name]    Encounter Date: 12/19/2017  PT End of Session - 12/19/17 1058    Visit Number  20    Number of Visits  30    Date for PT Re-Evaluation  01/12/18    Authorization Type  [employer] Medicare: recert on 1/23-2/21/19    Authorization - Visit Number  20    Authorization - Number of Visits  30    PT Start Time  0950    PT Stop Time  1035    PT Time Calculation (min)  45 min    Activity Tolerance  Patient tolerated treatment well    Behavior During Therapy  [facility] for tasks assessed/performed       Past Medical History:  Diagnosis Date  . Arthritis   . Atrial fibrillation (HCC)   . Diabetes mellitus without complication (HCC)    borderline  . Hyperlipidemia   . Hypertension   . Myocardial infarction (HCC)   . Normal coronary arteries    by cardiac catheterization performed 03/14/06  . Obesity   . Sleep apnea    on CPAP  . Venous insufficiency     Past Surgical History:  Procedure Laterality Date  . CRANIOTOMY Right 08/27/2016   Procedure: CRANIOTOMY HEMATOMA EVACUATION SUBDURAL;  Surgeon: Maeola HarmanJoseph Stern, MD;  [facility];  Service: Neurosurgery;  Laterality: Right;  . GASTRIC BYPASS  09/2008  . JOINT REPLACEMENT  08/31/2006   right hip  . TOTAL HIP ARTHROPLASTY     right hip    There were no vitals filed for this visit.              Wound Therapy - 12/19/17 1053    Subjective  Pt stated he has burning on wound bed, intermittent pain 9/10.  Arrived with dressing intact    Patient and Family Stated Goals  wound to healling    Date of Onset  10/19/17    Prior Treatments  self care     Pain Assessment  0-10    Pain Score  7     Pain Type  Acute pain    Pain Location  Leg    Pain Orientation   Right    Pain Descriptors / Indicators  Burning    Patients Stated Pain Goal  0    Pain Intervention(s)  Emotional support    Evaluation and Treatment Procedures Explained to Patient/Family  Yes    Evaluation and Treatment Procedures  agreed to    Wound Properties Date First Assessed: 12/07/17 Time First Assessed: 1003 Wound Type: Other (Comment) Location: Leg Location Orientation: Right;Lower Wound Description (Comments): this wound is distal and central to previous 2 wounds Present on Admission: No   Dressing Type  Silver hydrofiber;Alginate;Compression wrap profore with extra cotton    Dressing Changed  Changed    Dressing Status  Old drainage    Dressing Change Frequency  PRN    Site / Wound Assessment  Red;Yellow    % Wound base Red or Granulating  50%    % Wound base Yellow/Fibrinous Exudate  50%    % Wound base Black/Eschar  0%    Peri-wound Assessment  Maceration    Margins  Attached edges (approximated)    Drainage Amount  Minimal  Drainage Description  Serous    Treatment  Cleansed;Debridement (Selective)    Wound Properties Date First Assessed: 11/21/17 Time First Assessed: 0830 Wound Type: Venous stasis ulcer;Other (Comment) Location: Leg Location Orientation: Right;Lower;Lateral Wound Description (Comments): This area had been fabrile since initial evaluation but now open with a .6 cm area in the distal wound that is draining purulent drainage.  This is connected underneath to the first wound as if you push on the anterior aspect of the leg increased purulent drainage comes out of the opening.  Present on Admission: -   Dressing Type  Compression wrap;Alginate;Silver hydrofiber profore with extra cotton    Dressing Changed  Changed    Dressing Status  Old drainage    Dressing Change Frequency  PRN    Site / Wound Assessment  Friable;Granulation tissue    % Wound base Red or Granulating  95%    % Wound base Yellow/Fibrinous Exudate  5%    % Wound base Black/Eschar  0%     Drainage Amount  Moderate    Drainage Description  Serous    Treatment  Cleansed;Debridement (Selective)    Wound Properties Date First Assessed: 11/03/17 Time First Assessed: 1315 Wound Type: Venous stasis ulcer;Other (Comment) Location: Leg Location Orientation: Right;Anterior Present on Admission: Yes   Dressing Type  Alginate;Silver hydrofiber;Compression wrap profore with extra cotton    Dressing Changed  Changed    Dressing Status  Old drainage    Dressing Change Frequency  PRN    Site / Wound Assessment  Granulation tissue;Yellow    % Wound base Red or Granulating  70%    % Wound base Yellow/Fibrinous Exudate  30%    Peri-wound Assessment  Edema;Maceration    Margins  Epibole (rolled edges)    Drainage Amount  Copious    Drainage Description  Serous    Selective Debridement - Location  all wounds    Selective Debridement - Tools Used  Forceps;Scissors    Selective Debridement - Tissue Removed  slough and devitalized tissue    Wound Therapy - Clinical Statement  Wounds continues to progress with slight decrease of drainage.  Noted filling in from largeest wound.  Therapist is no longer abele to express purulent drainage ,(only serous dreainage) from lateral wound.    Wound Therapy - Functional Problem List  Pain causing diffiiculty in walking     Factors Delaying/Impairing Wound Healing  Altered sensation;Vascular compromise    Hydrotherapy Plan  Debridement;Dressing change;Other (comment)    Wound Therapy - Frequency  3X / week    Wound Therapy - Current Recommendations  PT    Wound Plan  measure volume and wound site on Wednesdays.    Dressing   2 layers silverhydrofiber,2 layers of  alginate, ABD and profore     Manual Therapy  supraclavicular, deep and superfical abdominal followed by LE  Performed by Virgina Organ                PT Short Term Goals - 12/14/17 1109      PT SHORT TERM GOAL #1   Title  Pt Rt LE  volume to be decreased by 3 cm to allow improved  environment for wound healing.     Time  3    Period  Weeks    Status  On-going      PT SHORT TERM GOAL #2   Title  Drainage from Rt LE wound to be minimal to reduce infection rate.  Time  3    Period  Weeks    Status  On-going      PT SHORT TERM GOAL #3   Title  PT to verbalize the importance of throwing compression garments out after a year in order to assure proper compression     Time  1    Period  Weeks    Status  Achieved      PT SHORT TERM GOAL #4   Title  Wound to be 100% granulated to allow proper healing     Time  3    Period  Weeks    Status  On-going        PT Long Term Goals - 12/14/17 1110      PT LONG TERM GOAL #1   Title  Pt volume in Rt LE  to be decreased by 6 cm to decrease pain level to no greater than a 2/10 .     Time  6    Period  Weeks    Status  On-going      PT LONG TERM GOAL #2   Title  PT wound drainage to be scant to reduce infection     Time  6    Period  Weeks    Status  On-going      PT LONG TERM GOAL #3   Title  Pt wound to be no greater size than 2cm diameter to allow pt to feel conficent in self care.     Time  8    Period  Weeks    Status  On-going            Plan - 12/19/17 1058    Clinical Impression Statement  as above     Rehab Potential  Good    PT Frequency  2x / week    PT Duration  12 weeks additional 4 weeks from first cert.     PT Treatment/Interventions  Patient/family education;Manual techniques;Manual lymph drainage;Compression bandaging;Other (comment) debridement and dressing change of wound     PT Next Visit Plan  Remeasure volume and wound on Wednesdays . Continue with debridement and manual techniques; no manual to Rt LE at this time.        Patient will benefit from skilled therapeutic intervention in order to improve the following deficits and impairments:  Abnormal gait, Decreased activity tolerance, Obesity, Pain, Increased edema, Impaired sensation, Difficulty walking  Visit  Diagnosis: Lymphedema, not elsewhere classified  Open leg wound, right, subsequent encounter  Difficulty in walking, not elsewhere classified  Pain in right lower leg     Problem List Patient Active Problem List   Diagnosis Date Noted  . Severe aortic stenosis 08/09/2017  . Vitamin D deficiency 12/02/2016  . Family history of prostate cancer in father 11/03/2016  . Status post gastric bypass for obesity 11/02/2016  . Subdural hematoma (HCC) 08/26/2016  . Syncope 07/13/2016  . HTN (hypertension) 04/13/2013  . Hyperlipemia 04/13/2013  . Atrial fibrillation (HCC) 02/14/2013  . Acute myocardial infarction (HCC) 06/25/2008  . EXOGENOUS OBESITY 06/18/2008  . Obstructive sleep apnea 06/18/2008   Virgina Organ, PT CLT [phone] 12/19/2017, 11:00 AM  [facility] [address] [place], [name], [address] Phone: [phone]   Fax:  [phone]  Name: Joel Rogers MRN: [id number] Date of Birth: 1951/03/29

## 2017-12-20 ENCOUNTER — Telehealth: Payer: Self-pay | Admitting: Family Medicine

## 2017-12-20 ENCOUNTER — Telehealth: Payer: Self-pay | Admitting: Cardiovascular Disease

## 2017-12-20 DIAGNOSIS — L97929 Non-pressure chronic ulcer of unspecified part of left lower leg with unspecified severity: Principal | ICD-10-CM

## 2017-12-20 DIAGNOSIS — I872 Venous insufficiency (chronic) (peripheral): Secondary | ICD-10-CM

## 2017-12-20 NOTE — Telephone Encounter (Signed)
Spoke to pt concerning Disability claim form from [employer] (Claim#: [id number]).    Asked pt questions concerning how many hours per day pt can stand, sit, drive, and walk. Also asked about work capacity. Pt unable to move around much due to hip replacement and now c/o leg ulcers and wounds that are not healing. Pt unable to life more than 10 lbs.  Pt stated he has retired.   Routing this to Dr. Allyson SabalBerry for advisement on pt cardiac functional capacity (Class I-IV).  When response received, will return form to Medical records.

## 2017-12-20 NOTE — Telephone Encounter (Signed)
Dr.Bridges reviewed patients notes and recommends he see vascular and vein, she left her number incase Dr.Nelson needs to discuss referral changes (Dr.Bridges) Cb#: [id number] Cell#[phone]  * I can edit the referral with Dr.Nelson's permission, let me know.

## 2017-12-20 NOTE — Telephone Encounter (Signed)
Please advise 

## 2017-12-21 ENCOUNTER — Ambulatory Visit (HOSPITAL_COMMUNITY): Payer: Medicare Other

## 2017-12-21 ENCOUNTER — Encounter (HOSPITAL_COMMUNITY): Payer: Self-pay

## 2017-12-21 DIAGNOSIS — S81801D Unspecified open wound, right lower leg, subsequent encounter: Secondary | ICD-10-CM

## 2017-12-21 DIAGNOSIS — R262 Difficulty in walking, not elsewhere classified: Secondary | ICD-10-CM

## 2017-12-21 DIAGNOSIS — M79661 Pain in right lower leg: Secondary | ICD-10-CM

## 2017-12-21 DIAGNOSIS — I890 Lymphedema, not elsewhere classified: Secondary | ICD-10-CM

## 2017-12-21 NOTE — Therapy (Signed)
[facility] [address] [place], [place], [address] Phone: [phone]   Fax:  [phone]  Wound Care Therapy  Patient Details  Name: Joel LeachRalph L Rogers MRN: [id number] Date of Birth: 19-Oct-1951 Referring Provider: [name]    Encounter Date: 12/21/2017  PT End of Session - 12/21/17 1051    Visit Number  21    Number of Visits  30    Date for PT Re-Evaluation  01/12/18    Authorization Type  [employer] Medicare: recert on 1/23-2/21/19    Authorization - Visit Number  21    Authorization - Number of Visits  30    PT Start Time  0950    PT Stop Time  1028    PT Time Calculation (min)  38 min    Activity Tolerance  Patient tolerated treatment well    Behavior During Therapy  [facility] assessed/performed       Past Medical History:  Diagnosis Date  . Arthritis   . Atrial fibrillation (HCC)   . Diabetes mellitus without complication (HCC)    borderline  . Hyperlipidemia   . Hypertension   . Myocardial infarction (HCC)   . Normal coronary arteries    by cardiac catheterization performed 03/14/06  . Obesity   . Sleep apnea    on CPAP  . Venous insufficiency     Past Surgical History:  Procedure Laterality Date  . CRANIOTOMY Right 08/27/2016   Procedure: CRANIOTOMY HEMATOMA EVACUATION SUBDURAL;  Surgeon: Maeola HarmanJoseph Stern, MD;  [facility];  Service: Neurosurgery;  Laterality: Right;  . GASTRIC BYPASS  09/2008  . JOINT REPLACEMENT  08/31/2006   right hip  . TOTAL HIP ARTHROPLASTY     right hip    There were no vitals filed for this visit.   Subjective Assessment - 12/21/17 1044    Subjective  Pt stated he is feeling good, no reports of pain at entrance    Currently in Pain?  No/denies                Wound Therapy - 12/21/17 1044    Subjective  Pt stated he is feeling good, no reports of pain at entrance    Patient and Family Stated Goals  wound to healling    Date of Onset  10/19/17    Prior Treatments   self care     Pain Assessment  No/denies pain    Evaluation and Treatment Procedures Explained to Patient/Family  Yes    Evaluation and Treatment Procedures  agreed to    Wound Properties Date First Assessed: 12/07/17 Time First Assessed: 1003 Wound Type: Other (Comment) Location: Leg Location Orientation: Right;Lower Wound Description (Comments): this wound is distal and central to previous 2 wounds Present on Admission: No   Dressing Type  Silver hydrofiber;Alginate;Compression wrap profore    Dressing Changed  Changed    Dressing Status  Old drainage    Dressing Change Frequency  PRN    Site / Wound Assessment  Red;Yellow    % Wound base Red or Granulating  60%    % Wound base Yellow/Fibrinous Exudate  40%    % Wound base Black/Eschar  0%    Peri-wound Assessment  Maceration    Wound Length (cm)  0.6 cm    Wound Width (cm)  0.7 cm    Wound Depth (cm)  0 cm    Wound Volume (cm^3)  0 cm^3    Wound Surface Area (cm^2)  0.42 cm^2    Margins  Attached edges (approximated)    Drainage Amount  Minimal    Drainage Description  Serous    Treatment  Cleansed;Debridement (Selective) manual retro massage    Wound Properties Date First Assessed: 11/21/17 Time First Assessed: 0830 Wound Type: Venous stasis ulcer;Other (Comment) Location: Leg Location Orientation: Right;Lower;Lateral Wound Description (Comments): This area had been fabrile since initial evaluation but now open with a .6 cm area in the distal wound that is draining purulent drainage.  This is connected underneath to the first wound as if you push on the anterior aspect of the leg increased purulent drainage comes out of the opening.  Present on Admission: -   Dressing Type  Compression wrap;Alginate;Silver hydrofiber profore    Dressing Changed  Changed    Dressing Status  Old drainage    Dressing Change Frequency  PRN    Site / Wound Assessment  Friable;Granulation tissue    % Wound base Red or Granulating  95%    % Wound base  Yellow/Fibrinous Exudate  5%    % Wound base Black/Eschar  0%    Peri-wound Assessment  Edema;Maceration    Wound Length (cm)  2.5 cm    Wound Width (cm)  1.4 cm    Wound Depth (cm)  0 cm    Wound Volume (cm^3)  0 cm^3    Wound Surface Area (cm^2)  3.5 cm^2    Drainage Amount  Minimal    Drainage Description  Serous    Treatment  Cleansed;Debridement (Selective)    Wound Properties Date First Assessed: 11/03/17 Time First Assessed: 1315 Wound Type: Venous stasis ulcer;Other (Comment) Location: Leg Location Orientation: Right;Anterior Present on Admission: Yes   Dressing Type  Alginate;Silver hydrofiber;Compression wrap Profore    Dressing Changed  Changed    Dressing Status  Old drainage    Dressing Change Frequency  PRN    Site / Wound Assessment  Granulation tissue;Yellow    % Wound base Red or Granulating  80%    % Wound base Yellow/Fibrinous Exudate  20%    Peri-wound Assessment  Edema;Maceration    Wound Length (cm)  3.6 cm    Wound Width (cm)  1.9 cm    Wound Depth (cm)  0.4 cm    Wound Volume (cm^3)  2.74 cm^3    Wound Surface Area (cm^2)  6.84 cm^2    Margins  Epibole (rolled edges)    Drainage Amount  Copious    Drainage Description  Serous    Non-staged Wound Description  Not applicable    Treatment  Cleansed;Debridement (Selective)    Selective Debridement - Location  all wounds    Selective Debridement - Tools Used  Forceps;Scalpel    Selective Debridement - Tissue Removed  slough and devitalized tissue    Wound Therapy - Clinical Statement  Wounds continue to improve in granulation following debridement to promote healing.  Noted decrease in overall drainage as well.      Wound Therapy - Functional Problem List  Pain causing diffiiculty in walking     Factors Delaying/Impairing Wound Healing  Altered sensation;Vascular compromise    Hydrotherapy Plan  Debridement;Dressing change;Other (comment)    Wound Therapy - Frequency  3X / week    Wound Therapy - Current  Recommendations  PT    Wound Plan  measure volume and wound site on Wednesdays.    Dressing   2 layers silverhydrofiber,2 layers of  alginate, ABD and profore  Manual Therapy  -- Retro massage                 PT Short Term Goals - 12/14/17 1109      PT SHORT TERM GOAL #1   Title  Pt Rt LE  volume to be decreased by 3 cm to allow improved environment for wound healing.     Time  3    Period  Weeks    Status  On-going      PT SHORT TERM GOAL #2   Title  Drainage from Rt LE wound to be minimal to reduce infection rate.     Time  3    Period  Weeks    Status  On-going      PT SHORT TERM GOAL #3   Title  PT to verbalize the importance of throwing compression garments out after a year in order to assure proper compression     Time  1    Period  Weeks    Status  Achieved      PT SHORT TERM GOAL #4   Title  Wound to be 100% granulated to allow proper healing     Time  3    Period  Weeks    Status  On-going        PT Long Term Goals - 12/14/17 1110      PT LONG TERM GOAL #1   Title  Pt volume in Rt LE  to be decreased by 6 cm to decrease pain level to no greater than a 2/10 .     Time  6    Period  Weeks    Status  On-going      PT LONG TERM GOAL #2   Title  PT wound drainage to be scant to reduce infection     Time  6    Period  Weeks    Status  On-going      PT LONG TERM GOAL #3   Title  Pt wound to be no greater size than 2cm diameter to allow pt to feel conficent in self care.     Time  8    Period  Weeks    Status  On-going              Patient will benefit from skilled therapeutic intervention in order to improve the following deficits and impairments:     Visit Diagnosis: Lymphedema, not elsewhere classified  Open leg wound, right, subsequent encounter  Difficulty in walking, not elsewhere classified  Pain in right lower leg     Problem List Patient Active Problem List   Diagnosis Date Noted  . Severe aortic stenosis  08/09/2017  . Vitamin D deficiency 12/02/2016  . Family history of prostate cancer in father 11/03/2016  . Status post gastric bypass for obesity 11/02/2016  . Subdural hematoma (HCC) 08/26/2016  . Syncope 07/13/2016  . HTN (hypertension) 04/13/2013  . Hyperlipemia 04/13/2013  . Atrial fibrillation (HCC) 02/14/2013  . Acute myocardial infarction (HCC) 06/25/2008  . EXOGENOUS OBESITY 06/18/2008  . Obstructive sleep apnea 06/18/2008   Becky Sax, LPTA; CBIS [phone]  Juel Burrow 12/21/2017, 10:53 AM  [facility] [address] [place], [name], [address] Phone: [phone]   Fax:  [phone]  Name: Joel Rogers MRN: [id number] Date of Birth: [birth year]

## 2017-12-21 NOTE — Telephone Encounter (Signed)
[name] has minimal functional capacity and I'm unsure he is able to perform meaningful work

## 2017-12-21 NOTE — Telephone Encounter (Signed)
Forms signed and given to Medical records.

## 2017-12-21 NOTE — Telephone Encounter (Signed)
Please refer to vascular

## 2017-12-21 NOTE — Telephone Encounter (Signed)
I have placed referral

## 2017-12-23 ENCOUNTER — Encounter (HOSPITAL_COMMUNITY): Payer: Self-pay

## 2017-12-23 ENCOUNTER — Ambulatory Visit (HOSPITAL_COMMUNITY): Payer: Medicare Other | Attending: Family Medicine

## 2017-12-23 DIAGNOSIS — M79661 Pain in right lower leg: Secondary | ICD-10-CM | POA: Insufficient documentation

## 2017-12-23 DIAGNOSIS — R262 Difficulty in walking, not elsewhere classified: Secondary | ICD-10-CM | POA: Insufficient documentation

## 2017-12-23 DIAGNOSIS — S81801D Unspecified open wound, right lower leg, subsequent encounter: Secondary | ICD-10-CM | POA: Insufficient documentation

## 2017-12-23 DIAGNOSIS — I890 Lymphedema, not elsewhere classified: Secondary | ICD-10-CM | POA: Diagnosis not present

## 2017-12-23 NOTE — Therapy (Signed)
[facility] [address] [place], [name], [address] Phone: [phone]   Fax:  [phone]  Wound Care Therapy  Patient Details  Name: Joel Rogers MRN: [id number] Date of Birth: 11-16-1951 Referring Provider: Rica Mast    Encounter Date: 12/23/2017  PT End of Session - 12/23/17 1153    Visit Number  22    Number of Visits  30    Date for PT Re-Evaluation  01/12/18    Authorization Type  [employer] Medicare: recert on 1/23-2/21/19    Authorization - Visit Number  22    Authorization - Number of Visits  30    PT Start Time  [phone]    PT Stop Time  1028    PT Time Calculation (min)  40 min    Activity Tolerance  Patient tolerated treatment well    Behavior During Therapy  [facility] for tasks assessed/performed       Past Medical History:  Diagnosis Date  . Arthritis   . Atrial fibrillation (HCC)   . Diabetes mellitus without complication (HCC)    borderline  . Hyperlipidemia   . Hypertension   . Myocardial infarction (HCC)   . Normal coronary arteries    by cardiac catheterization performed 03/14/06  . Obesity   . Sleep apnea    on CPAP  . Venous insufficiency     Past Surgical History:  Procedure Laterality Date  . CRANIOTOMY Right 08/27/2016   Procedure: CRANIOTOMY HEMATOMA EVACUATION SUBDURAL;  Surgeon: Maeola Harman, MD;  Location: [facility] OR;  Service: Neurosurgery;  Laterality: Right;  . GASTRIC BYPASS  09/2008  . JOINT REPLACEMENT  08/31/2006   right hip  . TOTAL HIP ARTHROPLASTY     right hip    There were no vitals filed for this visit.   Subjective Assessment - 12/23/17 1102    Subjective  Pt stated he is feeling good today, no reports of pain                Wound Therapy - 12/23/17 1103    Subjective  Pt stated he is feeling good today, no reports of pain    Patient and Family Stated Goals  wound to healling    Date of Onset  10/19/17    Prior Treatments  self care     Pain Assessment  No/denies pain     Evaluation and Treatment Procedures Explained to Patient/Family  Yes    Evaluation and Treatment Procedures  agreed to    Wound Properties Date First Assessed: 12/07/17 Time First Assessed: 1003 Wound Type: Other (Comment) Location: Leg Location Orientation: Right;Lower Wound Description (Comments): this wound is distal and central to previous 2 wounds Present on Admission: No   Dressing Type  Silver hydrofiber;Alginate;Compression wrap profore with extra cotton    Dressing Changed  Changed    Dressing Status  Old drainage    Dressing Change Frequency  PRN    Site / Wound Assessment  Red;Yellow    % Wound base Red or Granulating  65%    % Wound base Yellow/Fibrinous Exudate  35%    Peri-wound Assessment  Maceration decreasesd maceration    Wound Length (cm)  0.6 cm    Wound Width (cm)  0.7 cm    Wound Depth (cm)  0 cm    Wound Volume (cm^3)  0 cm^3    Wound Surface Area (cm^2)  0.42 cm^2    Margins  Attached edges (approximated)  Drainage Amount  Scant    Drainage Description  Serous    Treatment  Cleansed;Debridement (Selective) Manual retro massage    Wound Properties Date First Assessed: 11/21/17 Time First Assessed: 0830 Wound Type: Venous stasis ulcer;Other (Comment) Location: Leg Location Orientation: Right;Lower;Lateral Wound Description (Comments): This area had been fabrile since initial evaluation but now open with a .6 cm area in the distal wound that is draining purulent drainage.  This is connected underneath to the first wound as if you push on the anterior aspect of the leg increased purulent drainage comes out of the opening.  Present on Admission: -   Dressing Type  Compression wrap;Alginate;Silver hydrofiber Profore    Dressing Changed  Changed    Dressing Status  Old drainage    Dressing Change Frequency  PRN    Site / Wound Assessment  Friable;Granulation tissue    % Wound base Red or Granulating  100%    % Wound base Yellow/Fibrinous Exudate  0%    Peri-wound  Assessment  Edema;Maceration    Wound Length (cm)  2.3 cm    Wound Width (cm)  1.3 cm    Wound Depth (cm)  0 cm    Wound Volume (cm^3)  0 cm^3    Wound Surface Area (cm^2)  2.99 cm^2    Drainage Amount  Minimal    Drainage Description  Serous    Treatment  Cleansed;Debridement (Selective) Manual retro massage    Wound Properties Date First Assessed: 11/03/17 Time First Assessed: 1315 Wound Type: Venous stasis ulcer;Other (Comment) Location: Leg Location Orientation: Right;Anterior Present on Admission: Yes   Dressing Type  Alginate;Silver hydrofiber;Compression wrap    Dressing Changed  Changed    Dressing Status  Old drainage    Dressing Change Frequency  PRN    Site / Wound Assessment  Granulation tissue;Yellow    % Wound base Red or Granulating  85%    % Wound base Yellow/Fibrinous Exudate  15%    Peri-wound Assessment  Edema;Maceration decreased maceration    Wound Length (cm)  3.5 cm    Wound Width (cm)  2 cm    Wound Depth (cm)  0.3 cm    Wound Volume (cm^3)  2.1 cm^3    Wound Surface Area (cm^2)  7 cm^2    Margins  Epibole (rolled edges)    Drainage Amount  Copious    Drainage Description  Serous    Non-staged Wound Description  Not applicable    Treatment  Cleansed;Debridement (Selective) manual retro massage    Selective Debridement - Location  all wounds    Selective Debridement - Tools Used  Forceps;Scalpel    Selective Debridement - Tissue Removed  slough and devitalized tissue    Wound Therapy - Clinical Statement  Wounds continue to improve in granulation tissue in wound bed and improved skin integrity surrounding with minimal maceration this session session and decreased drainage noted.  Continues wiht silver hydrofiber with alginate and profore compression hose for edema control.      Wound Therapy - Functional Problem List  Pain causing diffiiculty in walking     Factors Delaying/Impairing Wound Healing  Altered sensation;Vascular compromise    Hydrotherapy Plan   Debridement;Dressing change;Other (comment)    Wound Therapy - Frequency  3X / week    Wound Therapy - Current Recommendations  PT    Wound Plan  measure volume and wound site on Wednesdays.    Dressing   2 layers silverhydrofiber,2 layers of  alginate,  ABD and profore                 PT Short Term Goals - 12/14/17 1109      PT SHORT TERM GOAL #1   Title  Pt Rt LE  volume to be decreased by 3 cm to allow improved environment for wound healing.     Time  3    Period  Weeks    Status  On-going      PT SHORT TERM GOAL #2   Title  Drainage from Rt LE wound to be minimal to reduce infection rate.     Time  3    Period  Weeks    Status  On-going      PT SHORT TERM GOAL #3   Title  PT to verbalize the importance of throwing compression garments out after a year in order to assure proper compression     Time  1    Period  Weeks    Status  Achieved      PT SHORT TERM GOAL #4   Title  Wound to be 100% granulated to allow proper healing     Time  3    Period  Weeks    Status  On-going        PT Long Term Goals - 12/14/17 1110      PT LONG TERM GOAL #1   Title  Pt volume in Rt LE  to be decreased by 6 cm to decrease pain level to no greater than a 2/10 .     Time  6    Period  Weeks    Status  On-going      PT LONG TERM GOAL #2   Title  PT wound drainage to be scant to reduce infection     Time  6    Period  Weeks    Status  On-going      PT LONG TERM GOAL #3   Title  Pt wound to be no greater size than 2cm diameter to allow pt to feel conficent in self care.     Time  8    Period  Weeks    Status  On-going              Patient will benefit from skilled therapeutic intervention in order to improve the following deficits and impairments:     Visit Diagnosis: Lymphedema, not elsewhere classified  Open leg wound, right, subsequent encounter  Difficulty in walking, not elsewhere classified  Pain in right lower leg     Problem List Patient  Active Problem List   Diagnosis Date Noted  . Severe aortic stenosis 08/09/2017  . Vitamin D deficiency 12/02/2016  . Family history of prostate cancer in father 11/03/2016  . Status post gastric bypass for obesity 11/02/2016  . Subdural hematoma (HCC) 08/26/2016  . Syncope 07/13/2016  . HTN (hypertension) 04/13/2013  . Hyperlipemia 04/13/2013  . Atrial fibrillation (HCC) 02/14/2013  . Acute myocardial infarction (HCC) 06/25/2008  . EXOGENOUS OBESITY 06/18/2008  . Obstructive sleep apnea 06/18/2008   Becky Saxasey Jameelah Watts, LPTA; CBIS [phone]  Juel BurrowCockerham, Emilynn Srinivasan Jo 12/23/2017, 12:30 PM  [facility] [address] [place], [place], [address] Phone: [phone]   Fax:  [phone]  Name: Claudie LeachRalph L Leadbetter MRN: [id number] Date of Birth: 13-Jun-1951

## 2017-12-26 ENCOUNTER — Ambulatory Visit (HOSPITAL_COMMUNITY): Payer: Medicare Other | Admitting: Physical Therapy

## 2017-12-26 DIAGNOSIS — I890 Lymphedema, not elsewhere classified: Secondary | ICD-10-CM | POA: Diagnosis not present

## 2017-12-26 DIAGNOSIS — R262 Difficulty in walking, not elsewhere classified: Secondary | ICD-10-CM

## 2017-12-26 DIAGNOSIS — M79661 Pain in right lower leg: Secondary | ICD-10-CM | POA: Diagnosis not present

## 2017-12-26 DIAGNOSIS — S81801D Unspecified open wound, right lower leg, subsequent encounter: Secondary | ICD-10-CM

## 2017-12-26 NOTE — Therapy (Signed)
[facility] [address] [place], [place], [address] Phone: [phone]   Fax:  [phone]  Wound Care Therapy  Patient Details  Name: Joel Rogers MRN: [id number] Date of Birth: 11-25-1950 Referring Provider: [name]    Encounter Date: 12/26/2017    Past Medical History:  Diagnosis Date  . Arthritis   . Atrial fibrillation (HCC)   . Diabetes mellitus without complication (HCC)    borderline  . Hyperlipidemia   . Hypertension   . Myocardial infarction (HCC)   . Normal coronary arteries    by cardiac catheterization performed 03/14/06  . Obesity   . Sleep apnea    on CPAP  . Venous insufficiency     Past Surgical History:  Procedure Laterality Date  . CRANIOTOMY Right 08/27/2016   Procedure: CRANIOTOMY HEMATOMA EVACUATION SUBDURAL;  Surgeon: Maeola HarmanJoseph Stern, MD;  [facility];  Service: Neurosurgery;  Laterality: Right;  . GASTRIC BYPASS  09/2008  . JOINT REPLACEMENT  08/31/2006   right hip  . TOTAL HIP ARTHROPLASTY     right hip    There were no vitals filed for this visit.              Wound Therapy - 12/26/17 0852    Subjective  Pt states his leg is starting to itch more.  No pain or issues    Patient and Family Stated Goals  wound to healling    Date of Onset  10/19/17    Prior Treatments  self care     Pain Assessment  No/denies pain    Evaluation and Treatment Procedures Explained to Patient/Family  Yes    Evaluation and Treatment Procedures  agreed to    Wound Properties Date First Assessed: 12/07/17 Time First Assessed: 1003 Wound Type: Other (Comment) Location: Leg Location Orientation: Right;Lower Wound Description (Comments): this wound is distal and central to previous 2 wounds Present on Admission: No   Dressing Type  Silver hydrofiber;Alginate;Compression wrap profore with extra cotton    Dressing Changed  Changed    Dressing Status  Old drainage    Dressing Change Frequency  PRN    Site / Wound Assessment  Red;Yellow    % Wound base Red or Granulating  70%    % Wound base Yellow/Fibrinous Exudate  30%    Peri-wound Assessment  Maceration decreasesd maceration    Margins  Attached edges (approximated)    Drainage Amount  Minimal    Drainage Description  Serous    Treatment  Cleansed;Debridement (Selective)    Wound Properties Date First Assessed: 11/21/17 Time First Assessed: 0830 Wound Type: Venous stasis ulcer;Other (Comment) Location: Leg Location Orientation: Right;Lower;Lateral Wound Description (Comments): This area had been fabrile since initial evaluation but now open with a .6 cm area in the distal wound that is draining purulent drainage.  This is connected underneath to the first wound as if you push on the anterior aspect of the leg increased purulent drainage comes out of the opening.  Present on Admission: -   Dressing Type  Compression wrap;Alginate;Silver hydrofiber Profore    Dressing Changed  Changed    Dressing Status  Old drainage    Dressing Change Frequency  PRN    Site / Wound Assessment  Friable;Granulation tissue    % Wound base Red or Granulating  100%    % Wound base Yellow/Fibrinous Exudate  0%    Peri-wound Assessment  Edema;Maceration    Drainage Amount  Minimal  Drainage Description  Serous    Treatment  Cleansed;Debridement (Selective)    Wound Properties Date First Assessed: 11/03/17 Time First Assessed: 1315 Wound Type: Venous stasis ulcer;Other (Comment) Location: Leg Location Orientation: Right;Anterior Present on Admission: Yes   Dressing Type  Alginate;Silver hydrofiber;Compression wrap    Dressing Changed  Changed    Dressing Status  Old drainage    Dressing Change Frequency  PRN    Site / Wound Assessment  Granulation tissue;Yellow    % Wound base Red or Granulating  85%    % Wound base Yellow/Fibrinous Exudate  15%    Peri-wound Assessment  Edema;Maceration decreased maceration    Margins  Epibole (rolled edges)     Drainage Amount  Moderate    Drainage Description  Serous    Non-staged Wound Description  Not applicable    Treatment  Cleansed;Debridement (Selective)    Selective Debridement - Location  all wounds    Selective Debridement - Tools Used  Forceps;Scalpel    Selective Debridement - Tissue Removed  slough and devitalized tissue    Wound Therapy - Clinical Statement  continued improvment in granulation and decreased draiange.  Area around wounds also improving with less maceration and edema.  contineud with retro massage, debridment and dressing change to area.      Wound Therapy - Functional Problem List  Pain causing diffiiculty in walking     Factors Delaying/Impairing Wound Healing  Altered sensation;Vascular compromise    Hydrotherapy Plan  Debridement;Dressing change;Other (comment)    Wound Therapy - Frequency  3X / week    Wound Therapy - Current Recommendations  PT    Wound Plan  measure volume and wound site on Wednesdays.    Dressing   2 layers silverhydrofiber,2 layers of  alginate, ABD and profore     Manual Therapy  retro massage to Rt LE                PT Short Term Goals - 12/14/17 1109      PT SHORT TERM GOAL #1   Title  Pt Rt LE  volume to be decreased by 3 cm to allow improved environment for wound healing.     Time  3    Period  Weeks    Status  On-going      PT SHORT TERM GOAL #2   Title  Drainage from Rt LE wound to be minimal to reduce infection rate.     Time  3    Period  Weeks    Status  On-going      PT SHORT TERM GOAL #3   Title  PT to verbalize the importance of throwing compression garments out after a year in order to assure proper compression     Time  1    Period  Weeks    Status  Achieved      PT SHORT TERM GOAL #4   Title  Wound to be 100% granulated to allow proper healing     Time  3    Period  Weeks    Status  On-going        PT Long Term Goals - 12/14/17 1110      PT LONG TERM GOAL #1   Title  Pt volume in Rt LE  to be  decreased by 6 cm to decrease pain level to no greater than a 2/10 .     Time  6    Period  Weeks  Status  On-going      PT LONG TERM GOAL #2   Title  PT wound drainage to be scant to reduce infection     Time  6    Period  Weeks    Status  On-going      PT LONG TERM GOAL #3   Title  Pt wound to be no greater size than 2cm diameter to allow pt to feel conficent in self care.     Time  8    Period  Weeks    Status  On-going              Patient will benefit from skilled therapeutic intervention in order to improve the following deficits and impairments:     Visit Diagnosis: Lymphedema, not elsewhere classified  Open leg wound, right, subsequent encounter  Difficulty in walking, not elsewhere classified     Problem List Patient Active Problem List   Diagnosis Date Noted  . Severe aortic stenosis 08/09/2017  . Vitamin D deficiency 12/02/2016  . Family history of prostate cancer in father 11/03/2016  . Status post gastric bypass for obesity 11/02/2016  . Subdural hematoma (HCC) 08/26/2016  . Syncope 07/13/2016  . HTN (hypertension) 04/13/2013  . Hyperlipemia 04/13/2013  . Atrial fibrillation (HCC) 02/14/2013  . Acute myocardial infarction (HCC) 06/25/2008  . EXOGENOUS OBESITY 06/18/2008  . Obstructive sleep apnea 06/18/2008   Joel Rogers, PTA/CLT [phone]  Joel Rogers 12/26/2017, 9:00 AM  [facility] [address] [place], [name], [address] Phone: [phone]   Fax:  [phone]  Name: Joel Rogers MRN: [id number] Date of Birth: 1950/12/25

## 2017-12-28 ENCOUNTER — Encounter (HOSPITAL_COMMUNITY): Payer: Self-pay

## 2017-12-28 ENCOUNTER — Ambulatory Visit (HOSPITAL_COMMUNITY): Payer: Medicare Other

## 2017-12-28 DIAGNOSIS — M79661 Pain in right lower leg: Secondary | ICD-10-CM | POA: Diagnosis not present

## 2017-12-28 DIAGNOSIS — I890 Lymphedema, not elsewhere classified: Secondary | ICD-10-CM | POA: Diagnosis not present

## 2017-12-28 DIAGNOSIS — S81801D Unspecified open wound, right lower leg, subsequent encounter: Secondary | ICD-10-CM

## 2017-12-28 DIAGNOSIS — R262 Difficulty in walking, not elsewhere classified: Secondary | ICD-10-CM | POA: Diagnosis not present

## 2017-12-28 NOTE — Therapy (Signed)
[facility] [address] [place], [place], [address] Phone: [phone]   Fax:  [phone]  Wound Care Therapy  Patient Details  Name: Joel LeachRalph L Rogers MRN: [id number] Date of Birth: 1951-05-05 Referring Provider: [name]    Encounter Date: 12/28/2017  PT End of Session - 12/28/17 1028    Visit Number  23    Number of Visits  30    Date for PT Re-Evaluation  01/12/18    Authorization Type  [employer] Medicare: recert on 1/23-2/21/19    Authorization - Visit Number  23    Authorization - Number of Visits  30    PT Start Time  0949    PT Stop Time  1028    PT Time Calculation (min)  39 min    Activity Tolerance  Patient tolerated treatment well;No increased pain    Behavior During Therapy  WFL for tasks assessed/performed       Past Medical History:  Diagnosis Date  . Arthritis   . Atrial fibrillation (HCC)   . Diabetes mellitus without complication (HCC)    borderline  . Hyperlipidemia   . Hypertension   . Myocardial infarction (HCC)   . Normal coronary arteries    by cardiac catheterization performed 03/14/06  . Obesity   . Sleep apnea    on CPAP  . Venous insufficiency     Past Surgical History:  Procedure Laterality Date  . CRANIOTOMY Right 08/27/2016   Procedure: CRANIOTOMY HEMATOMA EVACUATION SUBDURAL;  Surgeon: Maeola HarmanJoseph Stern, MD;  [facility];  Service: Neurosurgery;  Laterality: Right;  . GASTRIC BYPASS  09/2008  . JOINT REPLACEMENT  08/31/2006   right hip  . TOTAL HIP ARTHROPLASTY     right hip    There were no vitals filed for this visit.   Subjective Assessment - 12/28/17 1255    Subjective  Pt stated he is feeling good today, minimal itching due to lots of lotion last session.  Pt reports in pride that he was able to take his shoe off easily due to decreased swelling.    Pertinent History  obesity, MI, venous insufficiency, HTN, Afib     Currently in Pain?  No/denies                Wound  Therapy - 12/28/17 1257    Subjective  Pt stated he is feeling good today, minimal itching due to lots of lotion last session.  Pt reports in pride that he was able to take his shoe off easily due to decreased swelling.    Patient and Family Stated Goals  wound to healling    Date of Onset  10/19/17    Prior Treatments  self care     Pain Assessment  No/denies pain    Evaluation and Treatment Procedures Explained to Patient/Family  Yes    Evaluation and Treatment Procedures  agreed to    Wound Properties Date First Assessed: 12/07/17 Time First Assessed: 1003 Wound Type: Other (Comment) Location: Leg Location Orientation: Right;Lower Wound Description (Comments): this wound is distal and central to previous 2 wounds Present on Admission: No   Dressing Type  Silver hydrofiber;Alginate;Compression wrap Profore    Dressing Changed  Changed    Dressing Status  Old drainage    Dressing Change Frequency  PRN    Site / Wound Assessment  Red;Yellow    % Wound base Red or Granulating  70%    % Wound base Yellow/Fibrinous Exudate  30%    % Wound base Black/Eschar  0%    Peri-wound Assessment  Maceration minimal maceration today    Wound Length (cm)  0.6 cm    Wound Width (cm)  0.7 cm    Wound Depth (cm)  0 cm    Wound Volume (cm^3)  0 cm^3    Wound Surface Area (cm^2)  0.42 cm^2    Margins  Attached edges (approximated)    Drainage Amount  Minimal    Drainage Description  Serous    Treatment  Cleansed;Debridement (Selective)    Wound Properties Date First Assessed: 11/21/17 Time First Assessed: 0830 Wound Type: Venous stasis ulcer;Other (Comment) Location: Leg Location Orientation: Right;Lower;Lateral Wound Description (Comments): This area had been fabrile since initial evaluation but now open with a .6 cm area in the distal wound that is draining purulent drainage.  This is connected underneath to the first wound as if you push on the anterior aspect of the leg increased purulent drainage comes  out of the opening.  Present on Admission: -   Dressing Type  Compression wrap;Alginate;Silver hydrofiber Profore    Dressing Changed  Changed    Dressing Status  Old drainage    Dressing Change Frequency  PRN    Site / Wound Assessment  Friable;Granulation tissue    % Wound base Red or Granulating  100%    % Wound base Yellow/Fibrinous Exudate  0%    % Wound base Black/Eschar  0%    Wound Length (cm)  1.2 cm was 2.3    Wound Width (cm)  1 cm was 1.3    Wound Depth (cm)  0 cm    Wound Volume (cm^3)  0 cm^3    Wound Surface Area (cm^2)  1.2 cm^2    Drainage Amount  Minimal    Drainage Description  Serous    Treatment  Cleansed;Debridement (Selective)    Wound Properties Date First Assessed: 11/03/17 Time First Assessed: 1315 Wound Type: Venous stasis ulcer;Other (Comment) Location: Leg Location Orientation: Right;Anterior Present on Admission: Yes   Dressing Type  Alginate;Silver hydrofiber;Compression wrap Profore    Dressing Changed  Changed    Dressing Status  Old drainage    Dressing Change Frequency  PRN    Site / Wound Assessment  Granulation tissue;Yellow    % Wound base Red or Granulating  85%    % Wound base Yellow/Fibrinous Exudate  15%    Peri-wound Assessment  Edema;Maceration both are decreaseing    Wound Length (cm)  3.5 cm was 3.5    Wound Width (cm)  2 cm    Wound Depth (cm)  0.2 cm    Wound Volume (cm^3)  1.4 cm^3    Wound Surface Area (cm^2)  7 cm^2    Margins  Epibole (rolled edges)    Drainage Amount  Moderate    Drainage Description  Serous    Treatment  Cleansed;Debridement (Selective)    Selective Debridement - Location  all wounds    Selective Debridement - Tools Used  Forceps;Scalpel    Selective Debridement - Tissue Removed  slough and devitalized tissue    Wound Therapy - Clinical Statement  Wound continues to improve in granulation and decreased depth.  Minimal maceration noted between wounds as well.  Continued selective debridement for removal of  slough on wound bed and dry skin perimeter.  Continued with silver and alginate to address drainage and Profore for compression, edema control.      Wound Therapy -  Functional Problem List  Pain causing diffiiculty in walking     Factors Delaying/Impairing Wound Healing  Altered sensation;Vascular compromise    Hydrotherapy Plan  Debridement;Dressing change;Other (comment)    Wound Therapy - Frequency  3X / week    Wound Therapy - Current Recommendations  PT    Wound Plan  measure volume and wound site on Wednesdays.    Dressing   1 layer silverhydrofiber,2 layers of  alginate, ABD and profore                 PT Short Term Goals - 12/14/17 1109      PT SHORT TERM GOAL #1   Title  Pt Rt LE  volume to be decreased by 3 cm to allow improved environment for wound healing.     Time  3    Period  Weeks    Status  On-going      PT SHORT TERM GOAL #2   Title  Drainage from Rt LE wound to be minimal to reduce infection rate.     Time  3    Period  Weeks    Status  On-going      PT SHORT TERM GOAL #3   Title  PT to verbalize the importance of throwing compression garments out after a year in order to assure proper compression     Time  1    Period  Weeks    Status  Achieved      PT SHORT TERM GOAL #4   Title  Wound to be 100% granulated to allow proper healing     Time  3    Period  Weeks    Status  On-going        PT Long Term Goals - 12/14/17 1110      PT LONG TERM GOAL #1   Title  Pt volume in Rt LE  to be decreased by 6 cm to decrease pain level to no greater than a 2/10 .     Time  6    Period  Weeks    Status  On-going      PT LONG TERM GOAL #2   Title  PT wound drainage to be scant to reduce infection     Time  6    Period  Weeks    Status  On-going      PT LONG TERM GOAL #3   Title  Pt wound to be no greater size than 2cm diameter to allow pt to feel conficent in self care.     Time  8    Period  Weeks    Status  On-going               Patient will benefit from skilled therapeutic intervention in order to improve the following deficits and impairments:     Visit Diagnosis: Lymphedema, not elsewhere classified  Open leg wound, right, subsequent encounter  Difficulty in walking, not elsewhere classified  Pain in right lower leg     Problem List Patient Active Problem List   Diagnosis Date Noted  . Severe aortic stenosis 08/09/2017  . Vitamin D deficiency 12/02/2016  . Family history of prostate cancer in father 11/03/2016  . Status post gastric bypass for obesity 11/02/2016  . Subdural hematoma (HCC) 08/26/2016  . Syncope 07/13/2016  . HTN (hypertension) 04/13/2013  . Hyperlipemia 04/13/2013  . Atrial fibrillation (HCC) 02/14/2013  . Acute myocardial infarction (HCC) 06/25/2008  . EXOGENOUS OBESITY 06/18/2008  .  Obstructive sleep apnea 06/18/2008   Becky Sax, LPTA; CBIS [phone]  Juel Burrow 12/28/2017, 1:16 PM  [facility] [address] [place], [name], [address] Phone: [phone]   Fax:  [phone]  Name: Joel Rogers MRN: [id number] Date of Birth: 30-Jul-1951

## 2017-12-30 ENCOUNTER — Encounter (HOSPITAL_COMMUNITY): Payer: Self-pay

## 2017-12-30 ENCOUNTER — Ambulatory Visit (HOSPITAL_COMMUNITY): Payer: Medicare Other

## 2017-12-30 DIAGNOSIS — R262 Difficulty in walking, not elsewhere classified: Secondary | ICD-10-CM | POA: Diagnosis not present

## 2017-12-30 DIAGNOSIS — M79661 Pain in right lower leg: Secondary | ICD-10-CM | POA: Diagnosis not present

## 2017-12-30 DIAGNOSIS — I890 Lymphedema, not elsewhere classified: Secondary | ICD-10-CM

## 2017-12-30 DIAGNOSIS — S81801D Unspecified open wound, right lower leg, subsequent encounter: Secondary | ICD-10-CM | POA: Diagnosis not present

## 2017-12-30 NOTE — Therapy (Signed)
[facility] [address] [place], [name], [address] Phone: [phone]   Fax:  [phone]  Wound Care Therapy  Patient Details  Name: Joel Rogers MRN: [id number] Date of Birth: December 21, 1950 Referring Provider: Rica Mast    Encounter Date: 12/30/2017  PT End of Session - 12/30/17 1028    Visit Number  24    Number of Visits  30    Date for PT Re-Evaluation  01/12/18    Authorization Type  [employer] Medicare: recert on 1/23-2/21/19    Authorization - Visit Number  24    Authorization - Number of Visits  30    PT Start Time  0946    PT Stop Time  1025    PT Time Calculation (min)  39 min    Activity Tolerance  Patient tolerated treatment well;No increased pain    Behavior During Therapy  WFL for tasks assessed/performed       Past Medical History:  Diagnosis Date  . Arthritis   . Atrial fibrillation (HCC)   . Diabetes mellitus without complication (HCC)    borderline  . Hyperlipidemia   . Hypertension   . Myocardial infarction (HCC)   . Normal coronary arteries    by cardiac catheterization performed 03/14/06  . Obesity   . Sleep apnea    on CPAP  . Venous insufficiency     Past Surgical History:  Procedure Laterality Date  . CRANIOTOMY Right 08/27/2016   Procedure: CRANIOTOMY HEMATOMA EVACUATION SUBDURAL;  Surgeon: Maeola Harman, MD;  Location: [facility];  Service: Neurosurgery;  Laterality: Right;  . GASTRIC BYPASS  09/2008  . JOINT REPLACEMENT  08/31/2006   right hip  . TOTAL HIP ARTHROPLASTY     right hip    There were no vitals filed for this visit.   Subjective Assessment - 12/30/17 1405    Subjective  Pt stated he slept wrong last night, increased soreness to hip.  Dressings intact and no reports of pain at entrance                Wound Therapy - 12/30/17 1406    Subjective  Pt stated he slept wrong last night, increased soreness to hip.  Dressings intact and no reports of pain at entrance    Patient  and Family Stated Goals  wound to healling    Date of Onset  10/19/17    Prior Treatments  self care     Pain Assessment  No/denies pain    Evaluation and Treatment Procedures Explained to Patient/Family  Yes    Evaluation and Treatment Procedures  agreed to    Wound Properties Date First Assessed: 12/07/17 Time First Assessed: 1003 Wound Type: Other (Comment) Location: Leg Location Orientation: Right;Lower Wound Description (Comments): this wound is distal and central to previous 2 wounds Present on Admission: No   Dressing Type  Silver hydrofiber;Alginate;Compression wrap 1 layer silverhydrofiber,2 layers of  alginate, ABD and prof    Dressing Changed  Changed    Dressing Status  Old drainage    Dressing Change Frequency  PRN    Site / Wound Assessment  Red;Yellow    % Wound base Red or Granulating  80%    % Wound base Yellow/Fibrinous Exudate  20%    % Wound base Black/Eschar  0%    Peri-wound Assessment  Intact;Edema no maceration this session    Wound Length (cm)  0.4 cm    Wound Width (cm)  0.7 cm  Wound Depth (cm)  0 cm    Wound Volume (cm^3)  0 cm^3    Wound Surface Area (cm^2)  0.28 cm^2    Margins  Attached edges (approximated)    Drainage Amount  Minimal    Drainage Description  Serous    Treatment  Cleansed;Debridement (Selective)    Wound Properties Date First Assessed: 11/21/17 Time First Assessed: 0830 Wound Type: Venous stasis ulcer;Other (Comment) Location: Leg Location Orientation: Right;Lower;Lateral Wound Description (Comments): This area had been fabrile since initial evaluation but now open with a .6 cm area in the distal wound that is draining purulent drainage.  This is connected underneath to the first wound as if you push on the anterior aspect of the leg increased purulent drainage comes out of the opening.  Present on Admission: -   Dressing Type  Compression wrap;Alginate;Silver hydrofiber 1 layer silverhydrofiber,2 layers of  alginate, ABD and prof     Dressing Changed  Changed    Dressing Status  Old drainage    Dressing Change Frequency  PRN    Site / Wound Assessment  Friable;Granulation tissue    % Wound base Red or Granulating  100%    % Wound base Yellow/Fibrinous Exudate  0%    Wound Length (cm)  1.1 cm    Wound Width (cm)  1 cm    Wound Depth (cm)  0 cm    Wound Volume (cm^3)  0 cm^3    Wound Surface Area (cm^2)  1.1 cm^2    Drainage Amount  Minimal    Drainage Description  Serous    Treatment  Cleansed;Debridement (Selective)    Wound Properties Date First Assessed: 11/03/17 Time First Assessed: 1315 Wound Type: Venous stasis ulcer;Other (Comment) Location: Leg Location Orientation: Right;Anterior Present on Admission: Yes   Dressing Type  -- 1 layer silverhydrofiber,2 layers of  alginate, ABD and prof    Dressing Changed  Changed    Dressing Status  Old drainage    Dressing Change Frequency  PRN    Site / Wound Assessment  Granulation tissue;Yellow    % Wound base Red or Granulating  85%    % Wound base Yellow/Fibrinous Exudate  15%    Wound Length (cm)  3.4 cm    Wound Width (cm)  2.2 cm    Wound Depth (cm)  0.2 cm    Wound Volume (cm^3)  1.5 cm^3    Wound Surface Area (cm^2)  7.48 cm^2    Margins  Epibole (rolled edges)    Drainage Amount  Moderate    Drainage Description  Serous    Non-staged Wound Description  Not applicable    Treatment  Cleansed;Debridement (Selective)    Selective Debridement - Location  all wounds    Selective Debridement - Tools Used  Forceps;Scalpel    Selective Debridement - Tissue Removed  slough and devitalized tissue    Wound Therapy - Clinical Statement  LE is progressing well.  Overall improved skin integrity with no maceration perimeter of wound, decreased drainage and improved granulation in wound bed.  Continued with silver hydrofiber and alginate for drainage and profore for edema control.    Wound Therapy - Functional Problem List  Pain causing diffiiculty in walking     Factors  Delaying/Impairing Wound Healing  Altered sensation;Vascular compromise    Hydrotherapy Plan  Debridement;Dressing change;Other (comment)    Wound Therapy - Frequency  3X / week    Wound Therapy - Current Recommendations  PT  Wound Plan  measure volume and wound site on Wednesdays.    Dressing   1 layer silverhydrofiber,2 layers of  alginate, ABD and profore                 PT Short Term Goals - 12/14/17 1109      PT SHORT TERM GOAL #1   Title  Pt Rt LE  volume to be decreased by 3 cm to allow improved environment for wound healing.     Time  3    Period  Weeks    Status  On-going      PT SHORT TERM GOAL #2   Title  Drainage from Rt LE wound to be minimal to reduce infection rate.     Time  3    Period  Weeks    Status  On-going      PT SHORT TERM GOAL #3   Title  PT to verbalize the importance of throwing compression garments out after a year in order to assure proper compression     Time  1    Period  Weeks    Status  Achieved      PT SHORT TERM GOAL #4   Title  Wound to be 100% granulated to allow proper healing     Time  3    Period  Weeks    Status  On-going        PT Long Term Goals - 12/14/17 1110      PT LONG TERM GOAL #1   Title  Pt volume in Rt LE  to be decreased by 6 cm to decrease pain level to no greater than a 2/10 .     Time  6    Period  Weeks    Status  On-going      PT LONG TERM GOAL #2   Title  PT wound drainage to be scant to reduce infection     Time  6    Period  Weeks    Status  On-going      PT LONG TERM GOAL #3   Title  Pt wound to be no greater size than 2cm diameter to allow pt to feel conficent in self care.     Time  8    Period  Weeks    Status  On-going              Patient will benefit from skilled therapeutic intervention in order to improve the following deficits and impairments:     Visit Diagnosis: Lymphedema, not elsewhere classified  Open leg wound, right, subsequent encounter  Difficulty in  walking, not elsewhere classified  Pain in right lower leg     Problem List Patient Active Problem List   Diagnosis Date Noted  . Severe aortic stenosis 08/09/2017  . Vitamin D deficiency 12/02/2016  . Family history of prostate cancer in father 11/03/2016  . Status post gastric bypass for obesity 11/02/2016  . Subdural hematoma (HCC) 08/26/2016  . Syncope 07/13/2016  . HTN (hypertension) 04/13/2013  . Hyperlipemia 04/13/2013  . Atrial fibrillation (HCC) 02/14/2013  . Acute myocardial infarction (HCC) 06/25/2008  . EXOGENOUS OBESITY 06/18/2008  . Obstructive sleep apnea 06/18/2008   Becky Sax, LPTA; CBIS [phone]  Juel Burrow 12/30/2017, 2:43 PM  [place] [address] [place], [name], [address] Phone: [phone]   Fax:  [phone]  Name: Joel Rogers MRN: [id number] Date of Birth: 13-Feb-1951

## 2018-01-02 ENCOUNTER — Other Ambulatory Visit: Payer: Self-pay

## 2018-01-02 ENCOUNTER — Encounter (HOSPITAL_COMMUNITY): Payer: Self-pay

## 2018-01-02 ENCOUNTER — Ambulatory Visit (HOSPITAL_COMMUNITY): Payer: Medicare Other

## 2018-01-02 DIAGNOSIS — M79661 Pain in right lower leg: Secondary | ICD-10-CM | POA: Diagnosis not present

## 2018-01-02 DIAGNOSIS — R262 Difficulty in walking, not elsewhere classified: Secondary | ICD-10-CM

## 2018-01-02 DIAGNOSIS — S81801D Unspecified open wound, right lower leg, subsequent encounter: Secondary | ICD-10-CM

## 2018-01-02 DIAGNOSIS — I890 Lymphedema, not elsewhere classified: Secondary | ICD-10-CM

## 2018-01-02 NOTE — Therapy (Signed)
[facility] [address] [place], [name], [address] Phone: [phone]   Fax:  [phone]  Wound Care Therapy  Patient Details  Name: Joel Rogers MRN: [id number] Date of Birth: 08/30/1951 Referring Provider: Rica Mast    Encounter Date: 01/02/2018  PT End of Session - 01/02/18 1056    Visit Number  25    Number of Visits  30    Date for PT Re-Evaluation  01/12/18    Authorization Type  [employer] Medicare: recert on 1/23-2/21/19    Authorization - Visit Number  25    Authorization - Number of Visits  30    PT Start Time  [phone]    PT Stop Time  1036    PT Time Calculation (min)  44 min    Activity Tolerance  Patient tolerated treatment well;No increased pain    Behavior During Therapy  WFL for tasks assessed/performed       Past Medical History:  Diagnosis Date  . Arthritis   . Atrial fibrillation (HCC)   . Diabetes mellitus without complication (HCC)    borderline  . Hyperlipidemia   . Hypertension   . Myocardial infarction (HCC)   . Normal coronary arteries    by cardiac catheterization performed 03/14/06  . Obesity   . Sleep apnea    on CPAP  . Venous insufficiency     Past Surgical History:  Procedure Laterality Date  . CRANIOTOMY Right 08/27/2016   Procedure: CRANIOTOMY HEMATOMA EVACUATION SUBDURAL;  Surgeon: Maeola Harman, MD;  Location: [facility] OR;  Service: Neurosurgery;  Laterality: Right;  . GASTRIC BYPASS  09/2008  . JOINT REPLACEMENT  08/31/2006   right hip  . TOTAL HIP ARTHROPLASTY     right hip    There were no vitals filed for this visit.   Subjective Assessment - 01/02/18 1045    Subjective  Patient denies pain at this time, he states he still has intermittent sharp pains above his current wounds but it is a 3/10 at worst. He reports he is slightly concerned there could be a new wound developing where he is feeling pain.     Pertinent History  obesity, MI, venous insufficiency, HTN, Afib     Currently  in Pain?  No/denies        Wound Therapy - 01/02/18 1045    Subjective  Patient denies pain at this time, he states he still has intermittent sharp pains above his current wounds but it is a 3/10 at worst. He reports he is slightly concerned there could be a new wound developing where he is feeling pain.    Patient and Family Stated Goals  wound to healling    Date of Onset  10/19/17    Prior Treatments  self care     Pain Assessment  No/denies pain    Pain Score  0-No pain    Evaluation and Treatment Procedures Explained to Patient/Family  Yes    Evaluation and Treatment Procedures  agreed to    Wound Properties Date First Assessed: 12/07/17 Time First Assessed: 1003 Wound Type: Other (Comment) Location: Leg Location Orientation: Right;Lower Wound Description (Comments): this wound is distal and central to previous 2 wounds Present on Admission: No   Dressing Type  Silver hydrofiber;Alginate;Compression wrap 1 silver hydrofiber, 2 alginate, Profore    Dressing Changed  Changed    Dressing Status  Old drainage    Dressing Change Frequency  PRN    Site / Wound  Assessment  Red;Yellow    % Wound base Red or Granulating  80%    % Wound base Yellow/Fibrinous Exudate  20%    % Wound base Black/Eschar  0%    Peri-wound Assessment  Intact;Edema    Margins  Attached edges (approximated)    Drainage Amount  Minimal    Drainage Description  Serous    Treatment  Cleansed;Debridement (Selective)    Wound Properties Date First Assessed: 11/21/17 Time First Assessed: 0830 Wound Type: Venous stasis ulcer;Other (Comment) Location: Leg Location Orientation: Right;Lower;Lateral Wound Description (Comments): This area had been fabrile since initial evaluation but now open with a .6 cm area in the distal wound that is draining purulent drainage.  This is connected underneath to the first wound as if you push on the anterior aspect of the leg increased purulent drainage comes out of the opening.  Present on  Admission: -   Dressing Type  Compression wrap;Alginate;Silver hydrofiber 1 silver hydrofiber, 2 alginate, Profore    Dressing Changed  Changed    Dressing Status  Old drainage    Dressing Change Frequency  PRN    Site / Wound Assessment  Friable;Granulation tissue    % Wound base Red or Granulating  100%    % Wound base Yellow/Fibrinous Exudate  0%    Peri-wound Assessment  Edema    Drainage Amount  Minimal    Drainage Description  Serous    Treatment  Cleansed;Debridement (Selective)    Wound Properties Date First Assessed: 11/03/17 Time First Assessed: 1315 Wound Type: Venous stasis ulcer;Other (Comment) Location: Leg Location Orientation: Right;Anterior Present on Admission: Yes   Dressing Type  Alginate;Silver hydrofiber;Compression wrap 1 silver hydrofiber, 2 alginate, Profore    Dressing Changed  New    Dressing Status  Old drainage    Dressing Change Frequency  PRN    Site / Wound Assessment  Granulation tissue;Yellow    % Wound base Red or Granulating  90%    % Wound base Yellow/Fibrinous Exudate  10%    Peri-wound Assessment  Edema    Margins  Epibole (rolled edges) superior wound margin    Drainage Amount  Moderate    Drainage Description  Serous    Non-staged Wound Description  Not applicable    Treatment  Cleansed;Debridement (Selective)    Wound Therapy - Clinical Statement  Right LE is progressing well.  Overall improved skin integrity with no maceration of the periwound, overall decreased drainage and increased granulation tissue present for all wounds.  Continued with silver hydrofiber and alginate for drainage and profore for edema control.    Wound Therapy - Functional Problem List  Pain causing diffiiculty in walking     Factors Delaying/Impairing Wound Healing  Altered sensation;Vascular compromise    Hydrotherapy Plan  Debridement;Dressing change;Other (comment) manual techniques for decongestion    Wound Therapy - Frequency  3X / week    Wound Therapy - Current  Recommendations  PT    Wound Plan  measure volume and wound site on Wednesdays. Continue silver hyodrofiber wtih 2 alginate, eliminated ABD this session, use next session if maceration present.    Dressing   1 layer silverhydrofiber,2 layers of  alginate, and profore          PT Short Term Goals - 12/14/17 1109      PT SHORT TERM GOAL #1   Title  Pt Rt LE  volume to be decreased by 3 cm to allow improved environment for wound healing.  Time  3    Period  Weeks    Status  On-going      PT SHORT TERM GOAL #2   Title  Drainage from Rt LE wound to be minimal to reduce infection rate.     Time  3    Period  Weeks    Status  On-going      PT SHORT TERM GOAL #3   Title  PT to verbalize the importance of throwing compression garments out after a year in order to assure proper compression     Time  1    Period  Weeks    Status  Achieved      PT SHORT TERM GOAL #4   Title  Wound to be 100% granulated to allow proper healing     Time  3    Period  Weeks    Status  On-going        PT Long Term Goals - 12/14/17 1110      PT LONG TERM GOAL #1   Title  Pt volume in Rt LE  to be decreased by 6 cm to decrease pain level to no greater than a 2/10 .     Time  6    Period  Weeks    Status  On-going      PT LONG TERM GOAL #2   Title  PT wound drainage to be scant to reduce infection     Time  6    Period  Weeks    Status  On-going      PT LONG TERM GOAL #3   Title  Pt wound to be no greater size than 2cm diameter to allow pt to feel conficent in self care.     Time  8    Period  Weeks    Status  On-going        Plan - 01/02/18 1057    Clinical Impression Statement  as above    Rehab Potential  Good    PT Frequency  2x / week    PT Duration  12 weeks additional 4 weeks from first cert.     PT Treatment/Interventions  Patient/family education;Manual techniques;Manual lymph drainage;Compression bandaging;Other (comment) debridement and dressing change of wound     PT Next  Visit Plan  Remeasure volume and wound on Wednesdays . Continue with debridement and manual techniques; no manual to Rt LE at this time. Continue with ABD for drainage if maceration present next session.    Consulted and Agree with Plan of Care  Patient       Patient will benefit from skilled therapeutic intervention in order to improve the following deficits and impairments:  Abnormal gait, Decreased activity tolerance, Obesity, Pain, Increased edema, Impaired sensation, Difficulty walking  Visit Diagnosis: Lymphedema, not elsewhere classified  Open leg wound, right, subsequent encounter  Difficulty in walking, not elsewhere classified  Pain in right lower leg     Problem List Patient Active Problem List   Diagnosis Date Noted  . Severe aortic stenosis 08/09/2017  . Vitamin D deficiency 12/02/2016  . Family history of prostate cancer in father 11/03/2016  . Status post gastric bypass for obesity 11/02/2016  . Subdural hematoma (HCC) 08/26/2016  . Syncope 07/13/2016  . HTN (hypertension) 04/13/2013  . Hyperlipemia 04/13/2013  . Atrial fibrillation (HCC) 02/14/2013  . Acute myocardial infarction (HCC) 06/25/2008  . EXOGENOUS OBESITY 06/18/2008  . Obstructive sleep apnea 06/18/2008    Valentino Saxonachel Quinn-Brown, PT, DPT  Physical Therapist with [facility]  01/02/2018 11:00 AM    [place] [facility] [address] [place], [name], [address] Phone: [phone]   Fax:  [phone]  Name: ZAEDYN COVIN MRN: [id number] Date of Birth: 1951/07/06

## 2018-01-04 ENCOUNTER — Ambulatory Visit (HOSPITAL_COMMUNITY): Payer: Medicare Other

## 2018-01-04 ENCOUNTER — Encounter (HOSPITAL_COMMUNITY): Payer: Self-pay

## 2018-01-04 DIAGNOSIS — I890 Lymphedema, not elsewhere classified: Secondary | ICD-10-CM

## 2018-01-04 DIAGNOSIS — S81801D Unspecified open wound, right lower leg, subsequent encounter: Secondary | ICD-10-CM | POA: Diagnosis not present

## 2018-01-04 DIAGNOSIS — R262 Difficulty in walking, not elsewhere classified: Secondary | ICD-10-CM

## 2018-01-04 DIAGNOSIS — M79661 Pain in right lower leg: Secondary | ICD-10-CM | POA: Diagnosis not present

## 2018-01-04 NOTE — Therapy (Signed)
[facility] [address] [place], [name], [address] Phone: [phone]   Fax:  [phone]  Wound Care Therapy  Patient Details  Name: Joel Rogers MRN: [id number] Date of Birth: 1951-06-27 Referring Provider: Rica Mast    Encounter Date: 01/04/2018  PT End of Session - 01/04/18 1055    Visit Number  26    Number of Visits  30    Date for PT Re-Evaluation  01/12/18    Authorization Type  [employer] Medicare: recert on 1/23-2/21/19    Authorization - Visit Number  26    Authorization - Number of Visits  30    PT Start Time  0950    PT Stop Time  1040    PT Time Calculation (min)  50 min    Activity Tolerance  Patient tolerated treatment well;No increased pain    Behavior During Therapy  WFL for tasks assessed/performed       Past Medical History:  Diagnosis Date  . Arthritis   . Atrial fibrillation (HCC)   . Diabetes mellitus without complication (HCC)    borderline  . Hyperlipidemia   . Hypertension   . Myocardial infarction (HCC)   . Normal coronary arteries    by cardiac catheterization performed 03/14/06  . Obesity   . Sleep apnea    on CPAP  . Venous insufficiency     Past Surgical History:  Procedure Laterality Date  . CRANIOTOMY Right 08/27/2016   Procedure: CRANIOTOMY HEMATOMA EVACUATION SUBDURAL;  Surgeon: Maeola Harman, MD;  Location: [facility] OR;  Service: Neurosurgery;  Laterality: Right;  . GASTRIC BYPASS  09/2008  . JOINT REPLACEMENT  08/31/2006   right hip  . TOTAL HIP ARTHROPLASTY     right hip    There were no vitals filed for this visit.   Subjective Assessment - 01/04/18 1047    Subjective  Pt stated he is feeling good, no reports of pain today.  Did reports hips were stiff, drove a manual shift over weekend and feels that releated to stiffness.      Pertinent History  obesity, MI, venous insufficiency, HTN, Afib     Currently in Pain?  No/denies                Wound Therapy - 01/04/18  1048    Subjective  Pt stated he is feeling good, no reports of pain today.  Did reports hips were stiff, drove a manual shift over weekend and feels that releated to stiffness.      Patient and Family Stated Goals  wound to healling    Date of Onset  10/19/17    Prior Treatments  self care     Pain Assessment  No/denies pain    Evaluation and Treatment Procedures Explained to Patient/Family  Yes    Evaluation and Treatment Procedures  agreed to    Wound Properties Date First Assessed: 12/07/17 Time First Assessed: 1003 Wound Type: Other (Comment) Location: Leg Location Orientation: Right;Lower Wound Description (Comments): this wound is distal and central to previous 2 wounds Present on Admission: No   Dressing Type  Silver hydrofiber;Alginate;Compression wrap profore with extra cotton    Dressing Changed  Changed    Dressing Status  Old drainage    Dressing Change Frequency  PRN    Site / Wound Assessment  Red;Yellow    % Wound base Red or Granulating  90%    % Wound base Yellow/Fibrinous Exudate  10%    %  Wound base Black/Eschar  0%    Wound Length (cm)  0.4 cm    Wound Width (cm)  0.5 cm    Wound Depth (cm)  0 cm    Wound Volume (cm^3)  0 cm^3    Wound Surface Area (cm^2)  0.2 cm^2    Margins  Attached edges (approximated)    Drainage Amount  Minimal    Drainage Description  Serous    Treatment  Cleansed;Debridement (Selective)    Wound Properties Date First Assessed: 11/21/17 Time First Assessed: 0830 Wound Type: Venous stasis ulcer;Other (Comment) Location: Leg Location Orientation: Right;Lower;Lateral Wound Description (Comments): This area had been fabrile since initial evaluation but now open with a .6 cm area in the distal wound that is draining purulent drainage.  This is connected underneath to the first wound as if you push on the anterior aspect of the leg increased purulent drainage comes out of the opening.  Present on Admission: -   Dressing Type  Compression  wrap;Alginate;Silver hydrofiber Profore with extra cotton    Dressing Changed  Changed    Dressing Status  Old drainage    Dressing Change Frequency  PRN    Site / Wound Assessment  Granulation tissue    % Wound base Red or Granulating  100%    % Wound base Yellow/Fibrinous Exudate  0%    Peri-wound Assessment  Edema    Wound Length (cm)  0.9 cm was 4 cm on 12/31    Wound Width (cm)  0.8 cm was 5cm 12/31    Wound Depth (cm)  0 cm    Wound Volume (cm^3)  0 cm^3    Wound Surface Area (cm^2)  0.72 cm^2    Drainage Amount  Scant    Drainage Description  Serous    Treatment  Cleansed;Debridement (Selective)    Wound Properties Date First Assessed: 11/03/17 Time First Assessed: 1315 Wound Type: Venous stasis ulcer;Other (Comment) Location: Leg Location Orientation: Right;Anterior Present on Admission: Yes   Dressing Type  Alginate;Silver hydrofiber;Compression wrap Profore with extra cotton    Dressing Changed  Changed    Dressing Status  Old drainage    Dressing Change Frequency  PRN    Site / Wound Assessment  Granulation tissue;Yellow    % Wound base Red or Granulating  90%    % Wound base Yellow/Fibrinous Exudate  10%    % Wound base Black/Eschar  0%    Peri-wound Assessment  Edema    Wound Length (cm)  3.2 cm was 3.4 last session    Wound Width (cm)  2.2 cm was 2.2 last session    Wound Depth (cm)  0.1 cm    Wound Volume (cm^3)  0.7 cm^3    Wound Surface Area (cm^2)  7.04 cm^2    Margins  Epibole (rolled edges) lateral wound only    Drainage Amount  Moderate    Drainage Description  Serous    Treatment  Cleansed;Debridement (Selective)    Selective Debridement - Location  all wounds    Selective Debridement - Tools Used  Forceps;Scalpel    Selective Debridement - Tissue Removed  slough and devitalized tissue    Wound Therapy - Clinical Statement  Wounds continue to improve in granulation tissues with reduced depth on medial wound.  Lateral wounds at 90-100% granulation and no  depth this session.  Continues with silver hydrofiber and alginate with ABD pad for drainiange and profore for edema control.    Wound Therapy -  Functional Problem List  Pain causing diffiiculty in walking     Factors Delaying/Impairing Wound Healing  Altered sensation;Vascular compromise    Hydrotherapy Plan  Debridement;Dressing change;Other (comment) manual techniques for decongestion    Wound Therapy - Frequency  3X / week    Wound Therapy - Current Recommendations  PT    Wound Plan  measure volume and wound site on Wednesdays. Continue silver hyodrofiber wtih 2 alginate, eliminated ABD this session, use next session if maceration present.    Dressing   1 layer silverhydrofiber,2 layers of  alginate, ABD pad and profore                 PT Short Term Goals - 12/14/17 1109      PT SHORT TERM GOAL #1   Title  Pt Rt LE  volume to be decreased by 3 cm to allow improved environment for wound healing.     Time  3    Period  Weeks    Status  On-going      PT SHORT TERM GOAL #2   Title  Drainage from Rt LE wound to be minimal to reduce infection rate.     Time  3    Period  Weeks    Status  On-going      PT SHORT TERM GOAL #3   Title  PT to verbalize the importance of throwing compression garments out after a year in order to assure proper compression     Time  1    Period  Weeks    Status  Achieved      PT SHORT TERM GOAL #4   Title  Wound to be 100% granulated to allow proper healing     Time  3    Period  Weeks    Status  On-going        PT Long Term Goals - 12/14/17 1110      PT LONG TERM GOAL #1   Title  Pt volume in Rt LE  to be decreased by 6 cm to decrease pain level to no greater than a 2/10 .     Time  6    Period  Weeks    Status  On-going      PT LONG TERM GOAL #2   Title  PT wound drainage to be scant to reduce infection     Time  6    Period  Weeks    Status  On-going      PT LONG TERM GOAL #3   Title  Pt wound to be no greater size than 2cm  diameter to allow pt to feel conficent in self care.     Time  8    Period  Weeks    Status  On-going              Patient will benefit from skilled therapeutic intervention in order to improve the following deficits and impairments:     Visit Diagnosis: Lymphedema, not elsewhere classified  Open leg wound, right, subsequent encounter  Difficulty in walking, not elsewhere classified  Pain in right lower leg     Problem List Patient Active Problem List   Diagnosis Date Noted  . Severe aortic stenosis 08/09/2017  . Vitamin D deficiency 12/02/2016  . Family history of prostate cancer in father 11/03/2016  . Status post gastric bypass for obesity 11/02/2016  . Subdural hematoma (HCC) 08/26/2016  . Syncope 07/13/2016  . HTN (hypertension) 04/13/2013  .  Hyperlipemia 04/13/2013  . Atrial fibrillation (HCC) 02/14/2013  . Acute myocardial infarction (HCC) 06/25/2008  . EXOGENOUS OBESITY 06/18/2008  . Obstructive sleep apnea 06/18/2008   Becky Sax, LPTA; CBIS [phone]  Juel Burrow 01/04/2018, 10:57 AM   [facility] [address] [place], [name], [address] Phone: [phone]   Fax:  [phone]  Name: Joel Rogers MRN: [id number] Date of Birth: 1951-07-23

## 2018-01-06 ENCOUNTER — Ambulatory Visit (HOSPITAL_COMMUNITY): Payer: Medicare Other

## 2018-01-06 ENCOUNTER — Encounter (HOSPITAL_COMMUNITY): Payer: Self-pay

## 2018-01-06 DIAGNOSIS — R262 Difficulty in walking, not elsewhere classified: Secondary | ICD-10-CM | POA: Diagnosis not present

## 2018-01-06 DIAGNOSIS — I890 Lymphedema, not elsewhere classified: Secondary | ICD-10-CM | POA: Diagnosis not present

## 2018-01-06 DIAGNOSIS — S81801D Unspecified open wound, right lower leg, subsequent encounter: Secondary | ICD-10-CM

## 2018-01-06 DIAGNOSIS — M79661 Pain in right lower leg: Secondary | ICD-10-CM

## 2018-01-06 NOTE — Therapy (Signed)
[facility] [address] [place], [name], [address] Phone: [phone]   Fax:  [phone]  Wound Care Therapy  Patient Details  Name: Joel Rogers MRN: [id number] Date of Birth: [birth year] Referring Provider: Rica Mast    Encounter Date: 01/06/2018  PT End of Session - 01/06/18 1401    Visit Number  27    Number of Visits  30    Date for PT Re-Evaluation  01/12/18    Authorization Type  [employer] Medicare: recert on 1/23-2/21/19    Authorization - Visit Number  27    Authorization - Number of Visits  30    PT Start Time  1304    PT Stop Time  1349    PT Time Calculation (min)  45 min    Activity Tolerance  Patient tolerated treatment well;No increased pain    Behavior During Therapy  WFL for tasks assessed/performed       Past Medical History:  Diagnosis Date  . Arthritis   . Atrial fibrillation (HCC)   . Diabetes mellitus without complication (HCC)    borderline  . Hyperlipidemia   . Hypertension   . Myocardial infarction (HCC)   . Normal coronary arteries    by cardiac catheterization performed 03/14/06  . Obesity   . Sleep apnea    on CPAP  . Venous insufficiency     Past Surgical History:  Procedure Laterality Date  . CRANIOTOMY Right 08/27/2016   Procedure: CRANIOTOMY HEMATOMA EVACUATION SUBDURAL;  Surgeon: Maeola Harman, MD;  Location: [facility] OR;  Service: Neurosurgery;  Laterality: Right;  . GASTRIC BYPASS  09/2008  . JOINT REPLACEMENT  08/31/2006   right hip  . TOTAL HIP ARTHROPLASTY     right hip    There were no vitals filed for this visit.   Subjective Assessment - 01/06/18 1355    Subjective  No reports of pain, pt stated he feels like he slept wrong or sat for too long as hips are stiff today.      Pertinent History  obesity, MI, venous insufficiency, HTN, Afib     Currently in Pain?  No/denies                Wound Therapy - 01/06/18 1356    Subjective  No reports of pain, pt stated he  feels like he slept wrong or sat for too long as hips are stiff today.      Patient and Family Stated Goals  wound to healling    Date of Onset  10/19/17    Prior Treatments  self care     Pain Assessment  No/denies pain    Evaluation and Treatment Procedures Explained to Patient/Family  Yes    Evaluation and Treatment Procedures  agreed to    Wound Properties Date First Assessed: 12/07/17 Time First Assessed: 1003 Wound Type: Other (Comment) Location: Leg Location Orientation: Right;Lower Wound Description (Comments): this wound is distal and central to previous 2 wounds Present on Admission: No   Dressing Type  Silver hydrofiber;Alginate;Compression wrap Profore with additional cotton    Dressing Changed  Changed    Dressing Status  Old drainage    Dressing Change Frequency  PRN    Site / Wound Assessment  Red;Yellow    % Wound base Red or Granulating  95%    % Wound base Yellow/Fibrinous Exudate  5%    % Wound base Black/Eschar  0%    Wound Length (cm)  0.4  cm    Wound Width (cm)  0.5 cm    Wound Depth (cm)  0 cm    Wound Volume (cm^3)  0 cm^3    Wound Surface Area (cm^2)  0.2 cm^2    Margins  Attached edges (approximated)    Drainage Amount  Scant    Drainage Description  Serous    Treatment  Cleansed;Debridement (Selective)    Wound Properties Date First Assessed: 11/21/17 Time First Assessed: 0830 Wound Type: Venous stasis ulcer;Other (Comment) Location: Leg Location Orientation: Right;Lower;Lateral Wound Description (Comments): This area had been fabrile since initial evaluation but now open with a .6 cm area in the distal wound that is draining purulent drainage.  This is connected underneath to the first wound as if you push on the anterior aspect of the leg increased purulent drainage comes out of the opening.  Present on Admission: -   Dressing Type  Compression wrap;Alginate;Silver hydrofiber Profore with additional cotton    Dressing Changed  Changed    Dressing Status  Old  drainage    Dressing Change Frequency  PRN    Site / Wound Assessment  Granulation tissue    % Wound base Red or Granulating  100%    Wound Length (cm)  0.7 cm    Wound Width (cm)  0.7 cm    Wound Depth (cm)  0 cm    Wound Volume (cm^3)  0 cm^3    Wound Surface Area (cm^2)  0.49 cm^2    Drainage Amount  Scant    Drainage Description  Serous    Treatment  Cleansed;Debridement (Selective)    Wound Properties Date First Assessed: 11/03/17 Time First Assessed: 1315 Wound Type: Venous stasis ulcer;Other (Comment) Location: Leg Location Orientation: Right;Anterior Present on Admission: Yes   Dressing Type  Alginate;Silver hydrofiber;Compression wrap Profore with additional cotton    Dressing Changed  Changed    Dressing Status  Old drainage    Dressing Change Frequency  PRN    Site / Wound Assessment  Granulation tissue;Yellow    % Wound base Red or Granulating  90%    % Wound base Yellow/Fibrinous Exudate  10%    Peri-wound Assessment  Edema    Wound Length (cm)  3.3 cm    Wound Width (cm)  2.2 cm    Wound Depth (cm)  0.1 cm    Wound Volume (cm^3)  0.73 cm^3    Wound Surface Area (cm^2)  7.26 cm^2    Margins  Epibole (rolled edges)    Drainage Amount  Moderate    Drainage Description  Serous    Non-staged Wound Description  Not applicable    Treatment  Cleansed;Debridement (Selective)    Selective Debridement - Location  all wounds    Selective Debridement - Tools Used  Forceps;Scalpel    Selective Debridement - Tissue Removed  slough and devitalized tissue    Wound Therapy - Clinical Statement  Wounds continues to improve with lateral wounds almost 100% granulation.  Continued selective debridement for removal of slough in large wound bed and dry skin perimeter of wounds to promote healing.  Continued wtih silver hydrofiber and alginate for drainage control and profore for edema control.     Wound Therapy - Functional Problem List  Pain causing diffiiculty in walking     Factors  Delaying/Impairing Wound Healing  Altered sensation;Vascular compromise    Hydrotherapy Plan  Debridement;Dressing change;Other (comment) manual techniques for decongestion    Wound Therapy - Frequency  3X /  week    Wound Therapy - Current Recommendations  PT    Wound Plan  measure volume and wound site on Wednesdays. Continue silver hyodrofiber wtih 2 alginate, eliminated ABD this session, use next session if maceration present.    Dressing   1 layer silverhydrofiber,2 layers of  alginate, ABD pad and profore                 PT Short Term Goals - 12/14/17 1109      PT SHORT TERM GOAL #1   Title  Pt Rt LE  volume to be decreased by 3 cm to allow improved environment for wound healing.     Time  3    Period  Weeks    Status  On-going      PT SHORT TERM GOAL #2   Title  Drainage from Rt LE wound to be minimal to reduce infection rate.     Time  3    Period  Weeks    Status  On-going      PT SHORT TERM GOAL #3   Title  PT to verbalize the importance of throwing compression garments out after a year in order to assure proper compression     Time  1    Period  Weeks    Status  Achieved      PT SHORT TERM GOAL #4   Title  Wound to be 100% granulated to allow proper healing     Time  3    Period  Weeks    Status  On-going        PT Long Term Goals - 12/14/17 1110      PT LONG TERM GOAL #1   Title  Pt volume in Rt LE  to be decreased by 6 cm to decrease pain level to no greater than a 2/10 .     Time  6    Period  Weeks    Status  On-going      PT LONG TERM GOAL #2   Title  PT wound drainage to be scant to reduce infection     Time  6    Period  Weeks    Status  On-going      PT LONG TERM GOAL #3   Title  Pt wound to be no greater size than 2cm diameter to allow pt to feel conficent in self care.     Time  8    Period  Weeks    Status  On-going              Patient will benefit from skilled therapeutic intervention in order to improve the following  deficits and impairments:     Visit Diagnosis: Lymphedema, not elsewhere classified  Open leg wound, right, subsequent encounter  Difficulty in walking, not elsewhere classified  Pain in right lower leg     Problem List Patient Active Problem List   Diagnosis Date Noted  . Severe aortic stenosis 08/09/2017  . Vitamin D deficiency 12/02/2016  . Family history of prostate cancer in father 11/03/2016  . Status post gastric bypass for obesity 11/02/2016  . Subdural hematoma (HCC) 08/26/2016  . Syncope 07/13/2016  . HTN (hypertension) 04/13/2013  . Hyperlipemia 04/13/2013  . Atrial fibrillation (HCC) 02/14/2013  . Acute myocardial infarction (HCC) 06/25/2008  . EXOGENOUS OBESITY 06/18/2008  . Obstructive sleep apnea 06/18/2008   Becky Sax, LPTA; CBIS [phone]  Juel Burrow 01/06/2018, 2:02 PM  [place]  [facility] [address] [place], [name], [address] Phone: [phone]   Fax:  [phone]  Name: Joel Rogers MRN: [id number] Date of Birth: 1951-01-08

## 2018-01-09 ENCOUNTER — Other Ambulatory Visit: Payer: Self-pay

## 2018-01-09 ENCOUNTER — Ambulatory Visit (HOSPITAL_COMMUNITY): Payer: Medicare Other

## 2018-01-09 ENCOUNTER — Encounter (HOSPITAL_COMMUNITY): Payer: Self-pay

## 2018-01-09 DIAGNOSIS — R262 Difficulty in walking, not elsewhere classified: Secondary | ICD-10-CM

## 2018-01-09 DIAGNOSIS — S81801D Unspecified open wound, right lower leg, subsequent encounter: Secondary | ICD-10-CM

## 2018-01-09 DIAGNOSIS — M79661 Pain in right lower leg: Secondary | ICD-10-CM

## 2018-01-09 DIAGNOSIS — I890 Lymphedema, not elsewhere classified: Secondary | ICD-10-CM | POA: Diagnosis not present

## 2018-01-09 NOTE — Therapy (Signed)
[facility] [address] [place], [name], [address] Phone: [phone]   Fax:  [phone]  Wound Care Therapy  Patient Details  Name: Joel Rogers MRN: [id number] Date of Birth: 1951/02/06 Referring Provider: Rica Mast    Encounter Date: 01/09/2018  PT End of Session - 01/09/18 1054    Visit Number  28    Number of Visits  30    Date for PT Re-Evaluation  01/12/18    Authorization Type  [employer] Medicare: recert on 1/23-2/21/19    Authorization - Visit Number  28    Authorization - Number of Visits  30    PT Start Time  0948    PT Stop Time  1032    PT Time Calculation (min)  44 min    Activity Tolerance  Patient tolerated treatment well;No increased pain    Behavior During Therapy  WFL for tasks assessed/performed       Past Medical History:  Diagnosis Date  . Arthritis   . Atrial fibrillation (HCC)   . Diabetes mellitus without complication (HCC)    borderline  . Hyperlipidemia   . Hypertension   . Myocardial infarction (HCC)   . Normal coronary arteries    by cardiac catheterization performed 03/14/06  . Obesity   . Sleep apnea    on CPAP  . Venous insufficiency     Past Surgical History:  Procedure Laterality Date  . CRANIOTOMY Right 08/27/2016   Procedure: CRANIOTOMY HEMATOMA EVACUATION SUBDURAL;  Surgeon: Maeola Harman, MD;  Location: [facility] OR;  Service: Neurosurgery;  Laterality: Right;  . GASTRIC BYPASS  09/2008  . JOINT REPLACEMENT  08/31/2006   right hip  . TOTAL HIP ARTHROPLASTY     right hip    There were no vitals filed for this visit.   Subjective Assessment - 01/09/18 1043    Subjective  Patient states his leg has been itching a lot but he knows that it will begin to itch more as his wounds heal. He denies pain today.    Pertinent History  obesity, MI, venous insufficiency, HTN, Afib     Currently in Pain?  No/denies        Wound Therapy - 01/09/18 1044    Subjective  Patient states his leg  has been itching a lot but he knows that it will begin to itch more as his wounds heal. He denies pain today.    Patient and Family Stated Goals  wound to healling    Date of Onset  10/19/17    Prior Treatments  self care     Pain Assessment  No/denies pain    Evaluation and Treatment Procedures Explained to Patient/Family  Yes    Evaluation and Treatment Procedures  agreed to    Wound Properties Date First Assessed: 12/07/17 Time First Assessed: 1003 Wound Type: Other (Comment) Location: Leg Location Orientation: Right;Lower Wound Description (Comments): this wound is distal and central to previous 2 wounds Present on Admission: No   Dressing Type  Silver hydrofiber;Alginate;Compression wrap Profore and additional cotton    Dressing Changed  Changed    Dressing Status  Old drainage    Dressing Change Frequency  PRN    Site / Wound Assessment  Red;Yellow    % Wound base Red or Granulating  95%    % Wound base Yellow/Fibrinous Exudate  5%    % Wound base Black/Eschar  0%    Margins  Attached edges (approximated)  Drainage Amount  Scant    Drainage Description  Serous    Treatment  Cleansed;Debridement (Selective)    Wound Properties Date First Assessed: 11/03/17 Time First Assessed: 1315 Wound Type: Venous stasis ulcer;Other (Comment) Location: Leg Location Orientation: Right;Anterior Present on Admission: Yes   Dressing Type  Alginate;Silver hydrofiber;Compression wrap Profore and additional cotton    Dressing Changed  Changed    Dressing Status  Old drainage    Dressing Change Frequency  PRN    Site / Wound Assessment  Granulation tissue;Yellow    % Wound base Red or Granulating  95%    % Wound base Yellow/Fibrinous Exudate  5%    Peri-wound Assessment  Edema    Margins  Epibole (rolled edges)    Drainage Amount  Moderate    Drainage Description  Serous    Non-staged Wound Description  Not applicable    Treatment  Cleansed;Debridement (Selective)    Wound Properties Date First  Assessed: 11/21/17 Time First Assessed: 0830 Wound Type: Venous stasis ulcer;Other (Comment) Location: Leg Location Orientation: Right;Lower;Lateral Wound Description (Comments): This area had been fabrile since initial evaluation but now open with a .6 cm area in the distal wound that is draining purulent drainage.  This is connected underneath to the first wound as if you push on the anterior aspect of the leg increased purulent drainage comes out of the opening.  Present on Admission: -   Dressing Type  Compression wrap;Alginate;Silver hydrofiber Profore and additional cotton    Dressing Changed  Changed    Dressing Status  Old drainage    Dressing Change Frequency  PRN    Site / Wound Assessment  Granulation tissue    % Wound base Red or Granulating  100%    Drainage Amount  Scant    Drainage Description  Serous    Treatment  Cleansed;Debridement (Selective)    Selective Debridement - Location  all wounds    Selective Debridement - Tools Used  Forceps;Scalpel    Selective Debridement - Tissue Removed  slough and devitalized tissue    Wound Therapy - Clinical Statement  Right LE continues to progress well.  Overall improved skin integrity with no maceration of the periwound, decreased drainage and almost 100% granulation tissue present at all 3 wounds.  Continued with selective debridement and silver hydrofiber and alginate for drainage and profore for edema control.    Wound Therapy - Functional Problem List  Pain causing diffiiculty in walking     Factors Delaying/Impairing Wound Healing  Altered sensation;Vascular compromise    Hydrotherapy Plan  Debridement;Dressing change;Other (comment) manual techniques for decongestion    Wound Therapy - Frequency  3X / week    Wound Therapy - Current Recommendations  PT    Wound Plan  measure volume and wound site on Wednesdays. Continue silver hyodrofiber wtih 2 alginate and ABD.    Dressing   1 layer silverhydrofiber,2 layers of  alginate, ABD pad  and profore          PT Short Term Goals - 12/14/17 1109      PT SHORT TERM GOAL #1   Title  Pt Rt LE  volume to be decreased by 3 cm to allow improved environment for wound healing.     Time  3    Period  Weeks    Status  On-going      PT SHORT TERM GOAL #2   Title  Drainage from Rt LE wound to be minimal to reduce infection  rate.     Time  3    Period  Weeks    Status  On-going      PT SHORT TERM GOAL #3   Title  PT to verbalize the importance of throwing compression garments out after a year in order to assure proper compression     Time  1    Period  Weeks    Status  Achieved      PT SHORT TERM GOAL #4   Title  Wound to be 100% granulated to allow proper healing     Time  3    Period  Weeks    Status  On-going        PT Long Term Goals - 12/14/17 1110      PT LONG TERM GOAL #1   Title  Pt volume in Rt LE  to be decreased by 6 cm to decrease pain level to no greater than a 2/10 .     Time  6    Period  Weeks    Status  On-going      PT LONG TERM GOAL #2   Title  PT wound drainage to be scant to reduce infection     Time  6    Period  Weeks    Status  On-going      PT LONG TERM GOAL #3   Title  Pt wound to be no greater size than 2cm diameter to allow pt to feel conficent in self care.     Time  8    Period  Weeks    Status  On-going        Plan - 01/09/18 1055    Clinical Impression Statement  as above    Rehab Potential  Good    PT Frequency  2x / week    PT Duration  12 weeks additional 4 weeks from first cert.     PT Treatment/Interventions  Patient/family education;Manual techniques;Manual lymph drainage;Compression bandaging;Other (comment) debridement and dressing change of wound     PT Next Visit Plan  Remeasure volume and wound on Wednesdays . Continue with debridement and manual techniques; no manual to Rt LE at this time. Continue with ABD for drainage if maceration present next session.    Consulted and Agree with Plan of Care  Patient        Patient will benefit from skilled therapeutic intervention in order to improve the following deficits and impairments:  Abnormal gait, Decreased activity tolerance, Obesity, Pain, Increased edema, Impaired sensation, Difficulty walking  Visit Diagnosis: Lymphedema, not elsewhere classified  Open leg wound, right, subsequent encounter  Difficulty in walking, not elsewhere classified  Pain in right lower leg     Problem List Patient Active Problem List   Diagnosis Date Noted  . Severe aortic stenosis 08/09/2017  . Vitamin D deficiency 12/02/2016  . Family history of prostate cancer in father 11/03/2016  . Status post gastric bypass for obesity 11/02/2016  . Subdural hematoma (HCC) 08/26/2016  . Syncope 07/13/2016  . HTN (hypertension) 04/13/2013  . Hyperlipemia 04/13/2013  . Atrial fibrillation (HCC) 02/14/2013  . Acute myocardial infarction (HCC) 06/25/2008  . EXOGENOUS OBESITY 06/18/2008  . Obstructive sleep apnea 06/18/2008    Valentino Saxon, PT, DPT Physical Therapist with [facility]  01/09/2018 10:57 AM    [facility] [address] [place], [name], [address] Phone: [phone]   Fax:  [phone]  Name: Aayush Gelpi  Corine ShelterWatkins MRN: [id number] Date of Birth: 1951/02/20

## 2018-01-10 ENCOUNTER — Encounter: Payer: Self-pay | Admitting: Vascular Surgery

## 2018-01-10 ENCOUNTER — Ambulatory Visit: Payer: Medicare Other | Admitting: Vascular Surgery

## 2018-01-10 ENCOUNTER — Other Ambulatory Visit: Payer: Self-pay

## 2018-01-10 VITALS — BP 166/106 | HR 69 | Temp 96.9°F | Resp 18 | Ht 72.0 in | Wt 318.0 lb

## 2018-01-10 DIAGNOSIS — I83891 Varicose veins of right lower extremities with other complications: Secondary | ICD-10-CM | POA: Diagnosis not present

## 2018-01-10 NOTE — Progress Notes (Signed)
Subjective:     Patient ID: Joel Rogers, male   DOB: [birth year], 67 y.o.   MRN: [id number]  HPI This 67 year old male was referred from the [facility] wound center for evaluation of recurrent venous ulcer right leg. Patient has a remote history of "vein stripping" but he's not certain how much vein was removed. He has had multiple ulcerations in the anterior aspect of his right lower leg and has been going to the wound center for the past 3 months with gradual improvement in the current ulceration area is leg is too large above the knee to accommodate a long leg elastic compression stocking. He has a history of atrial fibrillation and has been on chronic anticoagulation until he had a "bleed in his brain" a few years ago blood thinners were stopped. He also has a history of coronary artery disease and has had a previous stent placed and is scheduled to see Dr. York Ram in mid March or possibly another stent.  Past Medical History:  Diagnosis Date  . Arthritis   . Atrial fibrillation (HCC)   . Diabetes mellitus without complication (HCC)    borderline  . Hyperlipidemia   . Hypertension   . Myocardial infarction (HCC)   . Normal coronary arteries    by cardiac catheterization performed 03/14/06  . Obesity   . Sleep apnea    on CPAP  . Venous insufficiency     Social History   Tobacco Use  . Smoking status: Never Smoker  . Smokeless tobacco: Never Used  Substance Use Topics  . Alcohol use: No    Family History  Problem Relation Age of Onset  . Hypertension Father   . Cancer Father        prob prostate  . Alzheimer's disease Mother   . Alzheimer's disease Maternal Grandfather   . Breast cancer Sister   . Cancer Brother        "brain tumor"    No Known Allergies   Current Outpatient Medications:  .  aspirin EC 81 MG tablet, Take 1 tablet (81 mg total) by mouth daily., Disp: 100 tablet, Rfl: 3 .  atorvastatin (LIPITOR) 80 MG tablet, TAKE ONE TABLET BY MOUTH ONCE  DAILY WITH BREAKFAST, Disp: 90 tablet, Rfl: 1 .  Calcium Carbonate-Vitamin D (CALCIUM + D PO), Take 1 tablet by mouth daily., Disp: , Rfl:  .  colchicine 0.6 MG tablet, Take 1 tablet (0.6 mg total) by mouth 2 (two) times daily., Disp: 180 tablet, Rfl: 1 .  ibuprofen (ADVIL,MOTRIN) 800 MG tablet, TAKE 1 TABLET BY MOUTH EVERY 8 HOURS AS NEEDED FOR MODERATE PAIN, Disp: 90 tablet, Rfl: 3 .  losartan (COZAAR) 50 MG tablet, Take 1 tablet (50 mg total) by mouth daily., Disp: 90 tablet, Rfl: 3 .  metoprolol (LOPRESSOR) 100 MG tablet, Take 1 tablet (100 mg total) by mouth 2 (two) times daily. (Patient taking differently: Take 50 mg by mouth 2 (two) times daily. ), Disp: 180 tablet, Rfl: 3 .  sulfamethoxazole-trimethoprim (BACTRIM DS,SEPTRA DS) 800-160 MG tablet, TAKE 1 TABLET BY MOUTH TWICE DAILY FOR 5 DAYS, Disp: 10 tablet, Rfl: 0 .  tadalafil (CIALIS) 20 MG tablet, Take 1 tablet (20 mg total) by mouth daily as needed for erectile dysfunction., Disp: 10 tablet, Rfl: 0 .  torsemide (DEMADEX) 20 MG tablet, Take 1 tablet (20 mg total) by mouth daily., Disp: 180 tablet, Rfl: 3 .  Vitamin D, Ergocalciferol, (DRISDOL) 50000 units CAPS capsule, TAKE ONE CAPSULE BY MOUTH  EVERY 7 DAYS, Disp: 30 capsule, Rfl: 1  Vitals:   01/10/18 1352  BP: (!) 166/106  Pulse: 69  Resp: 18  Temp: (!) 96.9 F (36.1 C)  TempSrc: Oral  SpO2: 98%  Weight: (!) 318 lb (144.2 kg)  Height: 6' (1.829 m)    Body mass index is 43.13 kg/m.         Review of Systems Patient has chronic obesity and has dyspnea on exertion. Has atrial fibrillation as noted in   history of present illness. Also known coronary artery disease. Objective:   Physical Exam BP (!) 166/106 (BP Location: Left Arm, Patient Position: Sitting, Cuff Size: Large)   Pulse 69   Temp (!) 96.9 F (36.1 C) (Oral)   Resp 18   Ht 6' (1.829 m)   Wt (!) 318 lb (144.2 kg)   SpO2 98%   BMI 43.13 kg/m     Gen.-alert and oriented x3 in no apparent  distress HEENT normal for age Lungs no rhonchi or wheezing Cardiovascular irregular rhythm no murmurs carotid pulses 3+ palpable no bruits audible Abdomen soft nontender no palpable masses-obese Musculoskeletal free of  major deformities Skin clear -no rashes Neurologic normal Lower extremities 3+ femoral and dorsalis pedis pulses palpable bilaterally with no edema on the left 1+ edema on the right Severe hyperpigmentation lower two thirds right leg below the knee with active ulcer in right pretibial region measuring 2 x 3 cm with granulation tissue. No palpable pulses in feet but excellent biphasic and triphasic flow with the Doppler. No bulging varicosities noted.     Assessment:     Recurrent ulceration right lower leg with severe hyperpigmentation and skin changes below the knee with chronic edema Chronic atrial fibrillation Contraindication to chronic anticoagulation because of previous intracerebral bleed Coronary artery disease with previous stent-to be reevaluated by Dr. York RamJonathan Barry in March 2019 for possible new stent    Plan:         #1 long leg elastic compression stockings 20-30 mm gradient will likely not be possible because of the size of the patient's thigh #2 elevate legs as much as possible #3 ibuprofen daily on a regular basis for pain #4 return in 3 months-formal venous reflux exam will be performed on return in 3 months and I will make formal recommendation at that time to determine if laser ablation of the right great and or small saphenous veins would be indicated He will continue going to the wound center for treatment of the improving stasis ulcer

## 2018-01-11 ENCOUNTER — Ambulatory Visit (HOSPITAL_COMMUNITY): Payer: Medicare Other | Admitting: Physical Therapy

## 2018-01-11 DIAGNOSIS — I890 Lymphedema, not elsewhere classified: Secondary | ICD-10-CM | POA: Diagnosis not present

## 2018-01-11 DIAGNOSIS — S81801D Unspecified open wound, right lower leg, subsequent encounter: Secondary | ICD-10-CM

## 2018-01-11 DIAGNOSIS — M79661 Pain in right lower leg: Secondary | ICD-10-CM | POA: Diagnosis not present

## 2018-01-11 DIAGNOSIS — R262 Difficulty in walking, not elsewhere classified: Secondary | ICD-10-CM | POA: Diagnosis not present

## 2018-01-11 NOTE — Therapy (Signed)
[place], [place], [address] Phone: [phone]   Fax:  [phone]  Wound Care Therapy  Patient Details  Name: Joel Rogers MRN: [id number] Date of Birth: Dec 19, 1950 Referring Provider: Blanchie Serve    Encounter Date: 01/11/2018  PT End of Session - 01/11/18 1045    Visit Number  29    Number of Visits  30    Date for PT Re-Evaluation  01/12/18    Authorization Type  [employer] Medicare: recert on 7/51-7/00/17    Authorization - Visit Number  29    Authorization - Number of Visits  30    PT Start Time  0940    PT Stop Time  1025    PT Time Calculation (min)  45 min    Activity Tolerance  Patient tolerated treatment well;No increased pain    Behavior During Therapy  WFL for tasks assessed/performed       Past Medical History:  Diagnosis Date  . Arthritis   . Atrial fibrillation ([place])   . Diabetes mellitus without complication (HCC)    borderline  . Hyperlipidemia   . Hypertension   . Myocardial infarction ([place])   . Normal coronary arteries    by cardiac catheterization performed 03/14/06  . Obesity   . Sleep apnea    on CPAP  . Venous insufficiency     Past Surgical History:  Procedure Laterality Date  . CRANIOTOMY Right 08/27/2016   Procedure: CRANIOTOMY HEMATOMA EVACUATION SUBDURAL;  Surgeon: Erline Levine, MD;  Location: [facility];  Service: Neurosurgery;  Laterality: Right;  . GASTRIC BYPASS  09/2008  . JOINT REPLACEMENT  08/31/2006   right hip  . TOTAL HIP ARTHROPLASTY     right hip    There were no vitals filed for this visit.              Wound Therapy - 01/11/18 1030    Subjective  Pt states he went to a vascular surgeon yesterday and he wants to perform surgery on his Rt LE when his wound heals.  Surgeon also discussed custom compression thigh highs for patient in the future.    Patient and Family Stated Goals  wound to healling    Date of Onset  10/19/17    Prior Treatments   self care     Pain Assessment  No/denies pain    Evaluation and Treatment Procedures Explained to Patient/Family  Yes    Evaluation and Treatment Procedures  agreed to    Wound Properties Date First Assessed: 12/07/17 Time First Assessed: 1003 Wound Type: Other (Comment) Location: Leg Location Orientation: Right;Lower Wound Description (Comments): this wound is distal and central to previous 2 wounds Present on Admission: No   Dressing Type  Impregnated gauze (bismuth);Gauze (Comment) Profore and additional cotton    Dressing Changed  Changed    Dressing Status  Old drainage    Dressing Change Frequency  PRN    Site / Wound Assessment  Red;Yellow    % Wound base Red or Granulating  100%    % Wound base Yellow/Fibrinous Exudate  0%    % Wound base Black/Eschar  0%    Wound Length (cm)  0.2 cm was 0.4 cm    Wound Width (cm)  0.2 cm was 0.5 cm    Wound Depth (cm)  0 cm    Wound Volume (cm^3)  0 cm^3    Wound Surface Area (cm^2)  0.04 cm^2  Margins  Attached edges (approximated)    Drainage Amount  None    Drainage Description  Serous    Treatment  Cleansed;Debridement (Selective)    Wound Properties Date First Assessed: 11/03/17 Time First Assessed: 1315 Wound Type: Venous stasis ulcer;Other (Comment) Location: Leg Location Orientation: Right;Anterior Present on Admission: Yes   Dressing Type  Compression wrap;Impregnated gauze (bismuth) Profore and additional cotton    Dressing Changed  Changed    Dressing Status  Old drainage    Dressing Change Frequency  PRN    Site / Wound Assessment  Granulation tissue;Yellow    % Wound base Red or Granulating  95%    % Wound base Yellow/Fibrinous Exudate  5%    Peri-wound Assessment  Edema    Wound Length (cm)  4 cm was 3.3    Wound Width (cm)  2 cm was 2.2    Wound Depth (cm)  0 cm was 0.1    Wound Volume (cm^3)  0 cm^3    Wound Surface Area (cm^2)  8 cm^2    Margins  Epibole (rolled edges)    Drainage Amount  Minimal    Drainage  Description  Serous    Non-staged Wound Description  Not applicable    Treatment  Cleansed;Debridement (Selective)    Wound Properties Date First Assessed: 11/21/17 Time First Assessed: 0830 Wound Type: Venous stasis ulcer;Other (Comment) Location: Leg Location Orientation: Right;Lower;Lateral Wound Description (Comments): This area had been fabrile since initial evaluation but now open with a .6 cm area in the distal wound that is draining purulent drainage.  This is connected underneath to the first wound as if you push on the anterior aspect of the leg increased purulent drainage comes out of the opening.  Present on Admission: -   Dressing Type  Compression wrap;Impregnated gauze (bismuth) Profore and additional cotton    Dressing Changed  Changed    Dressing Status  Old drainage    Dressing Change Frequency  PRN    Site / Wound Assessment  Granulation tissue    % Wound base Red or Granulating  100%    Wound Length (cm)  0.5 cm was 0.7cm    Wound Width (cm)  0.5 cm was 0.7 cm    Wound Depth (cm)  0.2 cm    Wound Volume (cm^3)  0.05 cm^3    Wound Surface Area (cm^2)  0.25 cm^2    Drainage Amount  Scant    Drainage Description  Serous    Treatment  Cleansed;Debridement (Selective)    Selective Debridement - Location  all wounds    Selective Debridement - Tools Used  Forceps;Scalpel    Selective Debridement - Tissue Removed  slough and devitalized tissue    Wound Therapy - Clinical Statement  Rt LE healing well with measurements revealing overall approximation, decreased depth and size.  Changed to xeroform today due to decrased drainage and increased granulation.  conitnued with profore for compression.   Pt has met all STGs and progressing towards LTG's.  Pt will need continued woundcare until wound is healed.    Wound Therapy - Functional Problem List  Pain causing diffiiculty in walking     Factors Delaying/Impairing Wound Healing  Altered sensation;Vascular compromise    Hydrotherapy  Plan  Debridement;Dressing change;Other (comment) manual techniques for decongestion    Wound Therapy - Frequency  3X / week    Wound Therapy - Current Recommendations  PT    Wound Plan  continue with woundcare.  Assess  if change to xeroform progressed healing.      Dressing   xeroform, gauze, ABD pad and profore                 PT Short Term Goals - 01/11/18 1046      PT SHORT TERM GOAL #1   Title  Pt Rt LE  volume to be decreased by 3 cm to allow improved environment for wound healing.     Time  3    Period  Weeks    Status  Achieved      PT SHORT TERM GOAL #2   Title  Drainage from Rt LE wound to be minimal to reduce infection rate.     Time  3    Period  Weeks    Status  Achieved      PT SHORT TERM GOAL #3   Title  PT to verbalize the importance of throwing compression garments out after a year in order to assure proper compression     Time  1    Period  Weeks    Status  Achieved      PT SHORT TERM GOAL #4   Title  Wound to be 100% granulated to allow proper healing     Time  3    Period  Weeks    Status  On-going        PT Long Term Goals - 01/11/18 1046      PT LONG TERM GOAL #1   Title  Pt volume in Rt LE  to be decreased by 6 cm to decrease pain level to no greater than a 2/10 .     Time  6    Period  Weeks    Status  On-going      PT LONG TERM GOAL #2   Title  PT wound drainage to be scant to reduce infection     Time  6    Period  Weeks    Status  On-going      PT LONG TERM GOAL #3   Title  Pt wound to be no greater size than 2cm diameter to allow pt to feel conficent in self care.     Time  8    Period  Weeks    Status  On-going            Plan - 01/11/18 1111    Clinical Impression Statement  as above    Rehab Potential  Good    PT Frequency  2x / week    PT Duration  Other (comment) 16 weeks - additional 4 weeks from second cert.     PT Treatment/Interventions  Patient/family education;Manual techniques;Manual lymph  drainage;Compression bandaging;Other (comment) debridement and dressing change of wound     PT Next Visit Plan  Remeasure volume and wound on Wednesdays . Continue with debridement and manual techniques; no manual to Rt LE at this time. Continue with ABD for drainage if maceration present next session.    Consulted and Agree with Plan of Care  Patient       Patient will benefit from skilled therapeutic intervention in order to improve the following deficits and impairments:  Abnormal gait, Decreased activity tolerance, Obesity, Pain, Increased edema, Impaired sensation, Difficulty walking  Visit Diagnosis: Lymphedema, not elsewhere classified  Open leg wound, right, subsequent encounter  Difficulty in walking, not elsewhere classified     Problem List Patient Active Problem List   Diagnosis Date Noted  .  Varicose veins of right lower extremity with complications 64/84/7207  . Severe aortic stenosis 08/09/2017  . Vitamin D deficiency 12/02/2016  . Family history of prostate cancer in father 11/03/2016  . Status post gastric bypass for obesity 11/02/2016  . Subdural hematoma ([place]) 08/26/2016  . Syncope 07/13/2016  . HTN (hypertension) 04/13/2013  . Hyperlipemia 04/13/2013  . Atrial fibrillation ([place]) 02/14/2013  . Acute myocardial infarction ([place]) 06/25/2008  . EXOGENOUS OBESITY 06/18/2008  . Obstructive sleep apnea 06/18/2008   Teena Irani, PTA/CLT [phone]  Teena Irani 01/11/2018, 10:49 AM  [facility] [address] [place], [place], [address] Phone: [phone]   Fax:  [phone]  Name: IFEANYICHUKWU WICKHAM MRN: [id number] Date of Birth: 1951-01-04

## 2018-01-11 NOTE — Addendum Note (Signed)
Addended by: Burton ApleyPETTY, Darleny Sem A on: 01/11/2018 12:24 PM   Modules accepted: Orders

## 2018-01-13 ENCOUNTER — Ambulatory Visit (HOSPITAL_COMMUNITY): Payer: Medicare Other

## 2018-01-13 ENCOUNTER — Encounter (HOSPITAL_COMMUNITY): Payer: Self-pay

## 2018-01-13 DIAGNOSIS — I890 Lymphedema, not elsewhere classified: Secondary | ICD-10-CM

## 2018-01-13 DIAGNOSIS — R262 Difficulty in walking, not elsewhere classified: Secondary | ICD-10-CM

## 2018-01-13 DIAGNOSIS — S81801D Unspecified open wound, right lower leg, subsequent encounter: Secondary | ICD-10-CM | POA: Diagnosis not present

## 2018-01-13 DIAGNOSIS — M79661 Pain in right lower leg: Secondary | ICD-10-CM | POA: Diagnosis not present

## 2018-01-13 NOTE — Therapy (Signed)
[place] [facility] [address] [place], [place], [address] Phone: [phone]   Fax:  [phone]  Wound Care Therapy  Patient Details  Name: Joel Rogers MRN: [id number] Date of Birth: 1951-06-29 Referring Provider: [name]    Encounter Date: 01/13/2018  PT End of Session - 01/13/18 1728    Visit Number  30    Number of Visits  30    Date for PT Re-Evaluation  02/09/18    Authorization Type  [employer] Medicare: recert on 1/23-2/21/19    Authorization - Visit Number  30    Authorization - Number of Visits  30    PT Start Time  1125    PT Stop Time  1205    PT Time Calculation (min)  40 min    Activity Tolerance  Patient tolerated treatment well;No increased pain    Behavior During Therapy  WFL for tasks assessed/performed       Past Medical History:  Diagnosis Date  . Arthritis   . Atrial fibrillation (HCC)   . Diabetes mellitus without complication (HCC)    borderline  . Hyperlipidemia   . Hypertension   . Myocardial infarction (HCC)   . Normal coronary arteries    by cardiac catheterization performed 03/14/06  . Obesity   . Sleep apnea    on CPAP  . Venous insufficiency     Past Surgical History:  Procedure Laterality Date  . CRANIOTOMY Right 08/27/2016   Procedure: CRANIOTOMY HEMATOMA EVACUATION SUBDURAL;  Surgeon: Maeola HarmanJoseph Stern, MD;  [facility];  Service: Neurosurgery;  Laterality: Right;  . GASTRIC BYPASS  09/2008  . JOINT REPLACEMENT  08/31/2006   right hip  . TOTAL HIP ARTHROPLASTY     right hip    There were no vitals filed for this visit.   Subjective Assessment - 01/13/18 1715    Subjective  Pt stated he is feeling good, hips are stiff due to the weather.      Currently in Pain?  No/denies                Wound Therapy - 01/13/18 1717    Subjective  Pt stated he is feeling good, hips are stiff due to the weather.      Patient and Family Stated Goals  wound to healling    Date of Onset   10/19/17    Prior Treatments  self care     Pain Assessment  No/denies pain    Evaluation and Treatment Procedures Explained to Patient/Family  Yes    Evaluation and Treatment Procedures  agreed to    Wound Properties Date First Assessed: 12/07/17 Time First Assessed: 1003 Wound Type: Other (Comment) Location: Leg Location Orientation: Right;Lower Wound Description (Comments): this wound is distal and central to previous 2 wounds Present on Admission: No   Dressing Type  Impregnated gauze (bismuth);Alginate;Abdominal pads;Compression wrap xeroform, alginate, cotton, ABD pad, profore    Dressing Changed  Changed    Dressing Status  Old drainage    Dressing Change Frequency  PRN    Site / Wound Assessment  Red;Yellow    % Wound base Red or Granulating  100%    Margins  Attached edges (approximated)    Drainage Amount  None    Treatment  Cleansed;Debridement (Selective)    Wound Properties Date First Assessed: 11/21/17 Time First Assessed: 0830 Wound Type: Venous stasis ulcer;Other (Comment) Location: Leg Location Orientation: Right;Lower;Lateral Wound Description (Comments): This area had been fabrile  since initial evaluation but now open with a .6 cm area in the distal wound that is draining purulent drainage.  This is connected underneath to the first wound as if you push on the anterior aspect of the leg increased purulent drainage comes out of the opening.  Present on Admission: -   Dressing Type  Compression wrap;Impregnated gauze (bismuth) xeroform, alginate, cotton, ABD pad, profore    Dressing Changed  Changed    Dressing Status  Old drainage    Dressing Change Frequency  PRN    Site / Wound Assessment  Granulation tissue    % Wound base Red or Granulating  100%    Drainage Amount  None    Treatment  Cleansed;Debridement (Selective)    Wound Properties Date First Assessed: 11/03/17 Time First Assessed: 1315 Wound Type: Venous stasis ulcer;Other (Comment) Location: Leg Location  Orientation: Right;Anterior Present on Admission: Yes   Dressing Type  Compression wrap;Impregnated gauze (bismuth) xeroform, alginate, cotton, ABD pad, profore    Dressing Changed  Changed    Dressing Status  Old drainage    Dressing Change Frequency  PRN    Site / Wound Assessment  Granulation tissue;Yellow    % Wound base Red or Granulating  95%    % Wound base Yellow/Fibrinous Exudate  5%    Peri-wound Assessment  Edema    Margins  Attached edges (approximated)    Drainage Amount  Minimal    Drainage Description  Serous    Non-staged Wound Description  Not applicable    Treatment  Cleansed;Debridement (Selective)    Selective Debridement - Location  all wounds    Selective Debridement - Tools Used  Forceps;Scalpel    Selective Debridement - Tissue Removed  slough and devitalized tissue    Wound Therapy - Clinical Statement  Wounds are progressing well with decreased drainage, improved granulation and good skin integrity surrounding wound.  Noted slight maceration proximal wounds bed, added alginate to address drainage.  continued with xeroform and profore for edema control.      Wound Therapy - Functional Problem List  Pain causing diffiiculty in walking     Factors Delaying/Impairing Wound Healing  Altered sensation;Vascular compromise    Hydrotherapy Plan  Debridement;Dressing change;Other (comment)    Wound Therapy - Frequency  3X / week    Wound Therapy - Current Recommendations  PT    Wound Plan  continue with woundcare.  Assess if change to xeroform progressed healing.      Dressing   xeroform, alginate, gauze, ABD pad and profore                 PT Short Term Goals - 01/11/18 1046      PT SHORT TERM GOAL #1   Title  Pt Rt LE  volume to be decreased by 3 cm to allow improved environment for wound healing.     Time  3    Period  Weeks    Status  Achieved      PT SHORT TERM GOAL #2   Title  Drainage from Rt LE wound to be minimal to reduce infection rate.      Time  3    Period  Weeks    Status  Achieved      PT SHORT TERM GOAL #3   Title  PT to verbalize the importance of throwing compression garments out after a year in order to assure proper compression     Time  1  Period  Weeks    Status  Achieved      PT SHORT TERM GOAL #4   Title  Wound to be 100% granulated to allow proper healing     Time  3    Period  Weeks    Status  On-going        PT Long Term Goals - 01/11/18 1046      PT LONG TERM GOAL #1   Title  Pt volume in Rt LE  to be decreased by 6 cm to decrease pain level to no greater than a 2/10 .     Time  6    Period  Weeks    Status  On-going      PT LONG TERM GOAL #2   Title  PT wound drainage to be scant to reduce infection     Time  6    Period  Weeks    Status  On-going      PT LONG TERM GOAL #3   Title  Pt wound to be no greater size than 2cm diameter to allow pt to feel conficent in self care.     Time  8    Period  Weeks    Status  On-going              Patient will benefit from skilled therapeutic intervention in order to improve the following deficits and impairments:     Visit Diagnosis: Lymphedema, not elsewhere classified  Open leg wound, right, subsequent encounter  Difficulty in walking, not elsewhere classified     Problem List Patient Active Problem List   Diagnosis Date Noted  . Varicose veins of right lower extremity with complications 01/10/2018  . Severe aortic stenosis 08/09/2017  . Vitamin D deficiency 12/02/2016  . Family history of prostate cancer in father 11/03/2016  . Status post gastric bypass for obesity 11/02/2016  . Subdural hematoma (HCC) 08/26/2016  . Syncope 07/13/2016  . HTN (hypertension) 04/13/2013  . Hyperlipemia 04/13/2013  . Atrial fibrillation (HCC) 02/14/2013  . Acute myocardial infarction (HCC) 06/25/2008  . EXOGENOUS OBESITY 06/18/2008  . Obstructive sleep apnea 06/18/2008   Becky Sax, LPTA; CBIS [phone]   Juel Burrow 01/13/2018, 5:29 PM  [place] [facility] [address] [place], [name], [address] Phone: [phone]   Fax:  [phone]  Name: Joel Rogers MRN: [id number] Date of Birth: 11-29-1950

## 2018-01-16 ENCOUNTER — Ambulatory Visit (HOSPITAL_COMMUNITY): Payer: Medicare Other | Admitting: Physical Therapy

## 2018-01-16 DIAGNOSIS — I890 Lymphedema, not elsewhere classified: Secondary | ICD-10-CM | POA: Diagnosis not present

## 2018-01-16 DIAGNOSIS — M79661 Pain in right lower leg: Secondary | ICD-10-CM | POA: Diagnosis not present

## 2018-01-16 DIAGNOSIS — R262 Difficulty in walking, not elsewhere classified: Secondary | ICD-10-CM | POA: Diagnosis not present

## 2018-01-16 DIAGNOSIS — S81801D Unspecified open wound, right lower leg, subsequent encounter: Secondary | ICD-10-CM

## 2018-01-16 NOTE — Therapy (Signed)
[facility] [address] [place], [name], [address] Phone: [phone]   Fax:  [phone]  Wound Care Therapy  Patient Details  Name: Joel Rogers MRN: [id number] Date of Birth: 03-29-1951 Referring Provider: Rica Mast    Encounter Date: 01/16/2018  PT End of Session - 01/16/18 1503    Visit Number  31    Number of Visits  38    Date for PT Re-Evaluation  02/09/18    Authorization Type  [employer] Medicare: recert on 2/21-3/21/19    Authorization - Visit Number  30    Authorization - Number of Visits  30    PT Start Time  0945    PT Stop Time  1030    PT Time Calculation (min)  45 min    Activity Tolerance  Patient tolerated treatment well;No increased pain    Behavior During Therapy  WFL for tasks assessed/performed       Past Medical History:  Diagnosis Date  . Arthritis   . Atrial fibrillation (HCC)   . Diabetes mellitus without complication (HCC)    borderline  . Hyperlipidemia   . Hypertension   . Myocardial infarction (HCC)   . Normal coronary arteries    by cardiac catheterization performed 03/14/06  . Obesity   . Sleep apnea    on CPAP  . Venous insufficiency     Past Surgical History:  Procedure Laterality Date  . CRANIOTOMY Right 08/27/2016   Procedure: CRANIOTOMY HEMATOMA EVACUATION SUBDURAL;  Surgeon: Maeola Harman, MD;  Location: [facility] OR;  Service: Neurosurgery;  Laterality: Right;  . GASTRIC BYPASS  09/2008  . JOINT REPLACEMENT  08/31/2006   right hip  . TOTAL HIP ARTHROPLASTY     right hip    There were no vitals filed for this visit.              Wound Therapy - 01/16/18 1455    Subjective  Pt reports conitnued improvement.     Patient and Family Stated Goals  wound to healling    Date of Onset  10/19/17    Prior Treatments  self care     Pain Assessment  No/denies pain    Evaluation and Treatment Procedures Explained to Patient/Family  Yes    Evaluation and Treatment Procedures   agreed to    Wound Properties Date First Assessed: 12/07/17 Time First Assessed: 1003 Wound Type: Other (Comment) Location: Leg Location Orientation: Right;Lower Wound Description (Comments): this wound is distal and central to previous 2 wounds Present on Admission: No   Dressing Type  -- xeroform, alginate, cotton, ABD pad, profore    Dressing Changed  --    Dressing Status  --    Dressing Change Frequency  --    Site / Wound Assessment  --    % Wound base Red or Granulating  --    Margins  --    Drainage Amount  --    Treatment  --    Wound Properties Date First Assessed: 11/21/17 Time First Assessed: 0830 Wound Type: Venous stasis ulcer;Other (Comment) Location: Leg Location Orientation: Right;Lower;Lateral Wound Description (Comments): This area had been fabrile since initial evaluation but now open with a .6 cm area in the distal wound that is draining purulent drainage.  This is connected underneath to the first wound as if you push on the anterior aspect of the leg increased purulent drainage comes out of the opening.  Present on Admission: -  Dressing Type  Compression wrap;Impregnated gauze (bismuth) xeroform, alginate, cotton, ABD pad, profore    Dressing Changed  Changed    Dressing Status  Old drainage    Dressing Change Frequency  PRN    Site / Wound Assessment  Granulation tissue    % Wound base Red or Granulating  100%    Drainage Amount  None    Treatment  Cleansed;Debridement (Selective)    Wound Properties Date First Assessed: 11/03/17 Time First Assessed: 1315 Wound Type: Venous stasis ulcer;Other (Comment) Location: Leg Location Orientation: Right;Anterior Present on Admission: Yes   Dressing Type  Compression wrap;Impregnated gauze (bismuth) xeroform, alginate, cotton, ABD pad, profore    Dressing Changed  Changed    Dressing Status  Old drainage    Dressing Change Frequency  PRN    Site / Wound Assessment  Granulation tissue;Yellow    % Wound base Red or Granulating   95%    % Wound base Yellow/Fibrinous Exudate  5%    Peri-wound Assessment  Edema    Margins  Attached edges (approximated)    Drainage Amount  Minimal    Drainage Description  Serous    Non-staged Wound Description  Not applicable    Treatment  Cleansed;Debridement (Selective)    Selective Debridement - Location  all wounds    Selective Debridement - Tools Used  Forceps;Scalpel    Selective Debridement - Tissue Removed  slough and devitalized tissue    Wound Therapy - Clinical Statement  continued improvement in wounds with most distal one now healed.  Continued with heavy moisturizer prior to compression bandagaging.      Wound Therapy - Functional Problem List  Pain causing diffiiculty in walking     Factors Delaying/Impairing Wound Healing  Altered sensation;Vascular compromise    Hydrotherapy Plan  Debridement;Dressing change;Other (comment)    Wound Therapy - Frequency  3X / week    Wound Therapy - Current Recommendations  PT    Wound Plan  continue with woundcare.  continue wtih current dressings.  Measure weekly, next session.    Dressing   xeroform, alginate, gauze, ABD pad and profore                 PT Short Term Goals - 01/11/18 1046      PT SHORT TERM GOAL #1   Title  Pt Rt LE  volume to be decreased by 3 cm to allow improved environment for wound healing.     Time  3    Period  Weeks    Status  Achieved      PT SHORT TERM GOAL #2   Title  Drainage from Rt LE wound to be minimal to reduce infection rate.     Time  3    Period  Weeks    Status  Achieved      PT SHORT TERM GOAL #3   Title  PT to verbalize the importance of throwing compression garments out after a year in order to assure proper compression     Time  1    Period  Weeks    Status  Achieved      PT SHORT TERM GOAL #4   Title  Wound to be 100% granulated to allow proper healing     Time  3    Period  Weeks    Status  On-going        PT Long Term Goals - 01/11/18 1046      PT LONG  TERM GOAL #1   Title  Pt volume in Rt LE  to be decreased by 6 cm to decrease pain level to no greater than a 2/10 .     Time  6    Period  Weeks    Status  On-going      PT LONG TERM GOAL #2   Title  PT wound drainage to be scant to reduce infection     Time  6    Period  Weeks    Status  On-going      PT LONG TERM GOAL #3   Title  Pt wound to be no greater size than 2cm diameter to allow pt to feel conficent in self care.     Time  8    Period  Weeks    Status  On-going              Patient will benefit from skilled therapeutic intervention in order to improve the following deficits and impairments:     Visit Diagnosis: Lymphedema, not elsewhere classified  Open leg wound, right, subsequent encounter     Problem List Patient Active Problem List   Diagnosis Date Noted  . Varicose veins of right lower extremity with complications 01/10/2018  . Severe aortic stenosis 08/09/2017  . Vitamin D deficiency 12/02/2016  . Family history of prostate cancer in father 11/03/2016  . Status post gastric bypass for obesity 11/02/2016  . Subdural hematoma (HCC) 08/26/2016  . Syncope 07/13/2016  . HTN (hypertension) 04/13/2013  . Hyperlipemia 04/13/2013  . Atrial fibrillation (HCC) 02/14/2013  . Acute myocardial infarction (HCC) 06/25/2008  . EXOGENOUS OBESITY 06/18/2008  . Obstructive sleep apnea 06/18/2008   Lurena Nida, PTA/CLT [phone]  Lurena Nida 01/16/2018, 3:04 PM  [place] [facility] [address] [place], [name], [address] Phone: [phone]   Fax:  [phone]  Name: Joel Rogers MRN: [id number] Date of Birth: May 24, 1951

## 2018-01-18 ENCOUNTER — Ambulatory Visit (HOSPITAL_COMMUNITY): Payer: Medicare Other | Admitting: Physical Therapy

## 2018-01-18 DIAGNOSIS — R262 Difficulty in walking, not elsewhere classified: Secondary | ICD-10-CM | POA: Diagnosis not present

## 2018-01-18 DIAGNOSIS — I890 Lymphedema, not elsewhere classified: Secondary | ICD-10-CM

## 2018-01-18 DIAGNOSIS — M79661 Pain in right lower leg: Secondary | ICD-10-CM | POA: Diagnosis not present

## 2018-01-18 DIAGNOSIS — S81801D Unspecified open wound, right lower leg, subsequent encounter: Secondary | ICD-10-CM

## 2018-01-18 NOTE — Therapy (Signed)
[place] [facility] [address] [place], [place], [address] Phone: [phone]   Fax:  [phone]  Wound Care Therapy  Patient Details  Name: Joel LeachRalph L Rogers MRN: [id number] Date of Birth: [birth year] Referring Provider: [name]    Encounter Date: 01/18/2018  PT End of Session - 01/18/18 1104    Visit Number  32    Number of Visits  38    Date for PT Re-Evaluation  02/09/18    Authorization Type  [employer] Medicare: recert on 2/21-3/21/19    Authorization - Visit Number  32    Authorization - Number of Visits  30    PT Start Time  0945    PT Stop Time  1030    PT Time Calculation (min)  45 min    Activity Tolerance  Patient tolerated treatment well;No increased pain    Behavior During Therapy  WFL for tasks assessed/performed       Past Medical History:  Diagnosis Date  . Arthritis   . Atrial fibrillation (HCC)   . Diabetes mellitus without complication (HCC)    borderline  . Hyperlipidemia   . Hypertension   . Myocardial infarction (HCC)   . Normal coronary arteries    by cardiac catheterization performed 03/14/06  . Obesity   . Sleep apnea    on CPAP  . Venous insufficiency     Past Surgical History:  Procedure Laterality Date  . CRANIOTOMY Right 08/27/2016   Procedure: CRANIOTOMY HEMATOMA EVACUATION SUBDURAL;  Surgeon: Maeola HarmanJoseph Stern, MD;  [facility];  Service: Neurosurgery;  Laterality: Right;  . GASTRIC BYPASS  09/2008  . JOINT REPLACEMENT  08/31/2006   right hip  . TOTAL HIP ARTHROPLASTY     right hip    There were no vitals filed for this visit.              Wound Therapy - 01/18/18 1057    Subjective  Pt states he is doing well and can tell his leg is improving, especially the coloring.  STates it begins to itch if doesnt' get the dressing changed every couple days.    Patient and Family Stated Goals  wound to healling    Date of Onset  10/19/17    Prior Treatments  self care     Pain Assessment   No/denies pain    Evaluation and Treatment Procedures Explained to Patient/Family  Yes    Evaluation and Treatment Procedures  agreed to    Wound Properties Date First Assessed: 12/07/17 Time First Assessed: 1003 Wound Type: Other (Comment) Location: Leg Location Orientation: Right;Lower Wound Description (Comments): this wound is distal and central to previous 2 wounds Present on Admission: No Final Assessment Date: 01/18/18   Dressing Type  -- xeroform, alginate, cotton, ABD pad, profore    Wound Properties Date First Assessed: 11/21/17 Time First Assessed: 0830 Wound Type: Venous stasis ulcer;Other (Comment) Location: Leg Location Orientation: Right;Lower;Lateral Wound Description (Comments): This area had been fabrile since initial evaluation but now open with a .6 cm area in the distal wound that is draining purulent drainage.  This is connected underneath to the first wound as if you push on the anterior aspect of the leg increased purulent drainage comes out of the opening.  Present on Admission: -   Dressing Type  Compression wrap;Impregnated gauze (bismuth) xeroform, alginate, cotton, ABD pad, profore    Dressing Status  Old drainage    Dressing Change Frequency  PRN  Site / Wound Assessment  Granulation tissue    % Wound base Red or Granulating  100%    Wound Length (cm)  0.2 cm was 0.5    Wound Width (cm)  0.2 cm was 0.5    Wound Depth (cm)  0.2 cm was 0.2    Wound Volume (cm^3)  0.01 cm^3    Wound Surface Area (cm^2)  0.04 cm^2    Drainage Amount  None    Treatment  Cleansed;Debridement (Selective)    Wound Properties Date First Assessed: 11/03/17 Time First Assessed: 1315 Wound Type: Venous stasis ulcer;Other (Comment) Location: Leg Location Orientation: Right;Anterior Present on Admission: Yes   Dressing Type  Compression wrap;Impregnated gauze (bismuth) xeroform, alginate, cotton, ABD pad, profore    Dressing Changed  Changed    Dressing Status  Old drainage    Dressing Change  Frequency  PRN    Site / Wound Assessment  Granulation tissue;Yellow    % Wound base Red or Granulating  95%    % Wound base Yellow/Fibrinous Exudate  5%    Peri-wound Assessment  Edema;Hemosiderin    Wound Length (cm)  3.1 cm was 4 cm    Wound Width (cm)  1.6 cm was 2 cm    Wound Depth (cm)  0 cm    Wound Volume (cm^3)  0 cm^3    Wound Surface Area (cm^2)  4.96 cm^2    Margins  Attached edges (approximated)    Drainage Amount  Minimal    Drainage Description  Serous    Non-staged Wound Description  Not applicable    Treatment  Cleansed;Debridement (Selective)    Selective Debridement - Location  all wounds    Selective Debridement - Tools Used  Forceps;Scalpel    Selective Debridement - Tissue Removed  slough and devitalized tissue    Wound Therapy - Clinical Statement  most distal wound is now healed and most lateral wound is almost completely healed with scant drainage.  Largest, central wound has decreased significantly in size with perimeter of LE improved with less induration and hemosideran staining in distal LE.  minimal dryness also noted in bandaged area, however dryness noted above in thigh area.  Reminded pateint to keep this area moisturized as well.      Wound Therapy - Functional Problem List  Pain causing diffiiculty in walking     Factors Delaying/Impairing Wound Healing  Altered sensation;Vascular compromise    Hydrotherapy Plan  Debridement;Dressing change;Other (comment)    Wound Therapy - Frequency  3X / week    Wound Therapy - Current Recommendations  PT    Wound Plan  continue with woundcare.  continue wtih current dressings.  Measure weekly (Wednesdays).      Dressing   xeroform, alginate, gauze, ABD pad and profore               PT Education - 01/18/18 1104    Education provided  Yes    Education Details  reminded to moisturize LE's    Person(s) Educated  Patient    Methods  Explanation    Comprehension  Verbalized understanding       PT Short  Term Goals - 01/11/18 1046      PT SHORT TERM GOAL #1   Title  Pt Rt LE  volume to be decreased by 3 cm to allow improved environment for wound healing.     Time  3    Period  Weeks    Status  Achieved  PT SHORT TERM GOAL #2   Title  Drainage from Rt LE wound to be minimal to reduce infection rate.     Time  3    Period  Weeks    Status  Achieved      PT SHORT TERM GOAL #3   Title  PT to verbalize the importance of throwing compression garments out after a year in order to assure proper compression     Time  1    Period  Weeks    Status  Achieved      PT SHORT TERM GOAL #4   Title  Wound to be 100% granulated to allow proper healing     Time  3    Period  Weeks    Status  On-going        PT Long Term Goals - 01/11/18 1046      PT LONG TERM GOAL #1   Title  Pt volume in Rt LE  to be decreased by 6 cm to decrease pain level to no greater than a 2/10 .     Time  6    Period  Weeks    Status  On-going      PT LONG TERM GOAL #2   Title  PT wound drainage to be scant to reduce infection     Time  6    Period  Weeks    Status  On-going      PT LONG TERM GOAL #3   Title  Pt wound to be no greater size than 2cm diameter to allow pt to feel conficent in self care.     Time  8    Period  Weeks    Status  On-going              Patient will benefit from skilled therapeutic intervention in order to improve the following deficits and impairments:     Visit Diagnosis: Lymphedema, not elsewhere classified  Open leg wound, right, subsequent encounter     Problem List Patient Active Problem List   Diagnosis Date Noted  . Varicose veins of right lower extremity with complications 01/10/2018  . Severe aortic stenosis 08/09/2017  . Vitamin D deficiency 12/02/2016  . Family history of prostate cancer in father 11/03/2016  . Status post gastric bypass for obesity 11/02/2016  . Subdural hematoma (HCC) 08/26/2016  . Syncope 07/13/2016  . HTN (hypertension)  04/13/2013  . Hyperlipemia 04/13/2013  . Atrial fibrillation (HCC) 02/14/2013  . Acute myocardial infarction (HCC) 06/25/2008  . EXOGENOUS OBESITY 06/18/2008  . Obstructive sleep apnea 06/18/2008   Lurena Nida, PTA/CLT [phone]  Lurena Nida 01/18/2018, 11:05 AM  [facility] [address] [place], [name], [address] Phone: [phone]   Fax:  [phone]  Name: Joel Rogers MRN: [id number] Date of Birth: Oct 31, 1951

## 2018-01-20 ENCOUNTER — Encounter (HOSPITAL_COMMUNITY): Payer: Self-pay | Admitting: Physical Therapy

## 2018-01-20 ENCOUNTER — Other Ambulatory Visit: Payer: Self-pay

## 2018-01-20 ENCOUNTER — Ambulatory Visit (HOSPITAL_COMMUNITY): Payer: Medicare Other | Attending: Family Medicine | Admitting: Physical Therapy

## 2018-01-20 DIAGNOSIS — S81802D Unspecified open wound, left lower leg, subsequent encounter: Secondary | ICD-10-CM | POA: Insufficient documentation

## 2018-01-20 DIAGNOSIS — I890 Lymphedema, not elsewhere classified: Secondary | ICD-10-CM

## 2018-01-20 DIAGNOSIS — S81801D Unspecified open wound, right lower leg, subsequent encounter: Secondary | ICD-10-CM | POA: Diagnosis not present

## 2018-01-20 NOTE — Therapy (Signed)
[facility] [place], [place], [address] Phone: [phone]   Fax:  [phone]  Wound Care Therapy  Patient Details  Name: Joel Rogers MRN: [id number] Date of Birth: May 19, 1951 Referring Provider: Blanchie Serve    Encounter Date: 01/20/2018  PT End of Session - 01/20/18 1327    Visit Number  33    Number of Visits  38    Date for PT Re-Evaluation  02/09/18    Authorization Type  U[employer] Medicare: recert on 7/61-6/07/37    Authorization - Visit Number  5    Authorization - Number of Visits  30    PT Start Time  1120    PT Stop Time  1062    PT Time Calculation (min)  45 min    Activity Tolerance  Patient tolerated treatment well;No increased pain    Behavior During Therapy  WFL for tasks assessed/performed       Past Medical History:  Diagnosis Date  . Arthritis   . Atrial fibrillation ([place])   . Diabetes mellitus without complication (HCC)    borderline  . Hyperlipidemia   . Hypertension   . Myocardial infarction ([place])   . Normal coronary arteries    by cardiac catheterization performed 03/14/06  . Obesity   . Sleep apnea    on CPAP  . Venous insufficiency     Past Surgical History:  Procedure Laterality Date  . CRANIOTOMY Right 08/27/2016   Procedure: CRANIOTOMY HEMATOMA EVACUATION SUBDURAL;  Surgeon: Erline Levine, MD;  Location: [facility];  Service: Neurosurgery;  Laterality: Right;  . GASTRIC BYPASS  09/2008  . JOINT REPLACEMENT  08/31/2006   right hip  . TOTAL HIP ARTHROPLASTY     right hip    There were no vitals filed for this visit.              Wound Therapy - 01/20/18 1317    Subjective  Pt states that he does not have a compression pump; only compression garments.     Patient and Family Stated Goals  wound to healling    Date of Onset  10/19/17    Prior Treatments  self care     Pain Assessment  No/denies pain    Evaluation and Treatment Procedures Explained to Patient/Family  Yes     Evaluation and Treatment Procedures  agreed to    Wound Properties Date First Assessed: 11/21/17 Time First Assessed: 0830 Wound Type: Venous stasis ulcer;Other (Comment) Location: Leg Location Orientation: Right;Lower;Lateral Wound Description (Comments): This area had been fabrile since initial evaluation but now open with a .6 cm area in the distal wound that is draining purulent drainage.  This is connected underneath to the first wound as if you push on the anterior aspect of the leg increased purulent drainage comes out of the opening.  Present on Admission: - Final Assessment Date: 01/20/18 Final Assessment Time: 1319   Drainage Amount  --    Drainage Description  --    Wound Properties Date First Assessed: 11/03/17 Time First Assessed: 6948 Wound Type: Venous stasis ulcer;Other (Comment) Location: Leg Location Orientation: Right;Anterior Present on Admission: Yes   Dressing Type  Compression wrap;Impregnated gauze (bismuth) xeroform, alginate, cotton, ABD pad, profore    Dressing Changed  Changed    Dressing Status  Old drainage    Dressing Change Frequency  PRN    Site / Wound Assessment  Granulation tissue;Yellow    % Wound base Red or  Granulating  100%    % Wound base Yellow/Fibrinous Exudate  0%    % Wound base Black/Eschar  0%    % Wound base Other/Granulation Tissue (Comment)  --    Peri-wound Assessment  Edema;Hemosiderin    Wound Length (cm)  2.8 cm    Wound Width (cm)  1.5 cm    Wound Surface Area (cm^2)  4.2 cm^2    Margins  Attached edges (approximated)    Drainage Amount  Minimal    Drainage Description  Serous    Non-staged Wound Description  Not applicable    Treatment  Cleansed;Other (Comment) manual     Selective Debridement - Location  --    Selective Debridement - Tools Used  --    Selective Debridement - Tissue Removed  dry skin with 4c4     Wound Therapy - Clinical Statement  Central wound is the only wound present at this time.  Order sent for compression  pump.  Therapist explained that pt most likely will not recieve the pump for approximately 3 weeks but to look for it in the mail.  As soon as prescription returns we will send off to [facility].  Therapist completed manual lymphedema decongestion techniqes with noted induration in distal thigh, lateral to wound and in posterior leg.  Induration responded well with manual.  Dressing chaned to xeroform.  Pt will be ready for discharge next week.  Wound should be small enough to place bandaid followed by pt own compression garment.    Wound Therapy - Functional Problem List  --    Factors Delaying/Impairing Wound Healing  Altered sensation;Vascular compromise    Hydrotherapy Plan  Debridement;Dressing change;Other (comment)    Wound Therapy - Frequency  3X / week    Wound Therapy - Current Recommendations  PT    Wound Plan  Complete manual.  Check on prescription for pump.     Dressing   xeroform , ABD pad and profore     Manual Therapy  decongestive manual techniques to include supraclavicular, deep and superfical abdominal, routing inguinal/axillary anastomosis and LE>     Manual Therapy  --                PT Short Term Goals - 01/20/18 1328      PT SHORT TERM GOAL #1   Title  Pt Rt LE  volume to be decreased by 3 cm to allow improved environment for wound healing.     Time  3    Period  Weeks    Status  Achieved      PT SHORT TERM GOAL #2   Title  Drainage from Rt LE wound to be minimal to reduce infection rate.     Time  3    Period  Weeks    Status  Achieved      PT SHORT TERM GOAL #3   Title  PT to verbalize the importance of throwing compression garments out after a year in order to assure proper compression     Time  1    Period  Weeks    Status  Achieved      PT SHORT TERM GOAL #4   Title  Wound to be 100% granulated to allow proper healing     Time  3    Period  Weeks    Status  Achieved        PT Long Term Goals - 01/20/18 1328      PT  LONG TERM GOAL #1    Title  Pt volume in Rt LE  to be decreased by 6 cm to decrease pain level to no greater than a 2/10 .     Time  6    Period  Weeks    Status  Achieved      PT LONG TERM GOAL #2   Title  PT wound drainage to be scant to reduce infection     Time  6    Period  Weeks    Status  Achieved      PT LONG TERM GOAL #3   Title  Pt wound to be no greater size than 2cm diameter to allow pt to feel conficent in self care.     Time  8    Period  Weeks    Status  Partially Met            Plan - 01/20/18 1327    Clinical Impression Statement  as above     Rehab Potential  Good    PT Frequency  2x / week    PT Duration  Other (comment) 16 weeks - additional 4 weeks from second cert.     PT Treatment/Interventions  Patient/family education;Manual techniques;Manual lymph drainage;Compression bandaging;Other (comment) debridement and dressing change of wound     PT Next Visit Plan  Remeasure volume and wound on Wednesdays . Continue with  manual techniques;     Consulted and Agree with Plan of Care  Patient       Patient will benefit from skilled therapeutic intervention in order to improve the following deficits and impairments:  Abnormal gait, Decreased activity tolerance, Obesity, Pain, Increased edema, Impaired sensation, Difficulty walking  Visit Diagnosis: Lymphedema, not elsewhere classified  Open wound of left lower leg with complication, subsequent encounter     Problem List Patient Active Problem List   Diagnosis Date Noted  . Varicose veins of right lower extremity with complications 70/76/1518  . Severe aortic stenosis 08/09/2017  . Vitamin D deficiency 12/02/2016  . Family history of prostate cancer in father 11/03/2016  . Status post gastric bypass for obesity 11/02/2016  . Subdural hematoma ([place]) 08/26/2016  . Syncope 07/13/2016  . HTN (hypertension) 04/13/2013  . Hyperlipemia 04/13/2013  . Atrial fibrillation ([place]) 02/14/2013  . Acute myocardial infarction  ([place]) 06/25/2008  . EXOGENOUS OBESITY 06/18/2008  . Obstructive sleep apnea 06/18/2008   Rayetta Humphrey, PT CLT [phone] 01/20/2018, 1:31 PM  [facility] [address] [place], [place], [address] Phone: [phone]   Fax:  [phone]  Name: Joel Rogers MRN: [id number] Date of Birth: 1951-10-13

## 2018-01-23 ENCOUNTER — Telehealth (HOSPITAL_COMMUNITY): Payer: Self-pay | Admitting: Physical Therapy

## 2018-01-23 ENCOUNTER — Ambulatory Visit (HOSPITAL_COMMUNITY): Payer: Medicare Other | Admitting: Physical Therapy

## 2018-01-23 DIAGNOSIS — S81802D Unspecified open wound, left lower leg, subsequent encounter: Secondary | ICD-10-CM

## 2018-01-23 DIAGNOSIS — I890 Lymphedema, not elsewhere classified: Secondary | ICD-10-CM | POA: Diagnosis not present

## 2018-01-23 DIAGNOSIS — S81801D Unspecified open wound, right lower leg, subsequent encounter: Secondary | ICD-10-CM

## 2018-01-23 NOTE — Therapy (Signed)
[facility] [place], [place], [address] Phone: [phone]   Fax:  [phone]  Wound Care Therapy  Patient Details  Name: Joel Rogers MRN: [id number] Date of Birth: Nov 02, 1951 Referring Provider: Blanchie Serve    Encounter Date: 01/23/2018  PT End of Session - 01/23/18 1110    Visit Number  34    Number of Visits  38    Date for PT Re-Evaluation  02/09/18    Authorization Type  [employer] Medicare: recert on 7/98-9/21/19    Authorization - Visit Number  34    Authorization - Number of Visits  30    PT Start Time  0953    PT Stop Time  4174    PT Time Calculation (min)  47 min    Activity Tolerance  Patient tolerated treatment well;No increased pain    Behavior During Therapy  WFL for tasks assessed/performed       Past Medical History:  Diagnosis Date  . Arthritis   . Atrial fibrillation ([place])   . Diabetes mellitus without complication (HCC)    borderline  . Hyperlipidemia   . Hypertension   . Myocardial infarction ([place])   . Normal coronary arteries    by cardiac catheterization performed 03/14/06  . Obesity   . Sleep apnea    on CPAP  . Venous insufficiency     Past Surgical History:  Procedure Laterality Date  . CRANIOTOMY Right 08/27/2016   Procedure: CRANIOTOMY HEMATOMA EVACUATION SUBDURAL;  Surgeon: Erline Levine, MD;  Location: [facility];  Service: Neurosurgery;  Laterality: Right;  . GASTRIC BYPASS  09/2008  . JOINT REPLACEMENT  08/31/2006   right hip  . TOTAL HIP ARTHROPLASTY     right hip    There were no vitals filed for this visit.              Wound Therapy - 01/23/18 1101    Subjective  Pt states his new stockings came in but he didn't know to bring one for his Rt LE today.  States he is wearing one on his Lt LE.     Patient and Family Stated Goals  wound to healling    Date of Onset  10/19/17    Prior Treatments  self care     Pain Assessment  No/denies pain    Evaluation and  Treatment Procedures Explained to Patient/Family  Yes    Evaluation and Treatment Procedures  agreed to    Wound Properties Date First Assessed: 11/03/17 Time First Assessed: 0814 Wound Type: Venous stasis ulcer;Other (Comment) Location: Leg Location Orientation: Right;Anterior Present on Admission: Yes   Dressing Type  Compression wrap;Impregnated gauze (bismuth) xeroform, alginate, cotton, ABD pad, profore    Dressing Changed  Changed    Dressing Status  Old drainage    Dressing Change Frequency  PRN    Site / Wound Assessment  Granulation tissue;Yellow    % Wound base Red or Granulating  100%    % Wound base Yellow/Fibrinous Exudate  0%    % Wound base Black/Eschar  0%    Peri-wound Assessment  Edema;Hemosiderin    Wound Length (cm)  2.5 cm    Wound Width (cm)  1.5 cm    Wound Depth (cm)  0 cm    Wound Volume (cm^3)  0 cm^3    Wound Surface Area (cm^2)  3.75 cm^2    Margins  Attached edges (approximated)    Drainage Amount  Minimal  Drainage Description  Serosanguineous    Non-staged Wound Description  Not applicable    Treatment  Cleansed;Debridement (Selective)    Selective Debridement - Location  primary wound    Selective Debridement - Tools Used  Forceps    Selective Debridement - Tissue Removed  dry skin perimeter of wound    Wound Therapy - Clinical Statement  Signed order for pump not received yet.  wound continues to remain 100% granulated with just minimal debridment required around the edges.  Cleansed wound well as well as LE and moisturized well prior to redressing with profore compression.  Requested patient bring his compression stocking to next visit to see if able to donn this over his wound.  Pt verbalized understanding.       Factors Delaying/Impairing Wound Healing  Altered sensation;Vascular compromise    Hydrotherapy Plan  Debridement;Dressing change;Other (comment)    Wound Therapy - Frequency  3X / week    Wound Therapy - Current Recommendations  PT    Wound  Plan  Complete manual.  Check on prescription for pump.     Dressing   xeroform , ABD pad and profore     Manual Therapy  decongestive manual techniques to include supraclavicular, deep and superfical abdominal, routing inguinal/axillary anastomosis and LE>                 PT Short Term Goals - 01/20/18 1328      PT SHORT TERM GOAL #1   Title  Pt Rt LE  volume to be decreased by 3 cm to allow improved environment for wound healing.     Time  3    Period  Weeks    Status  Achieved      PT SHORT TERM GOAL #2   Title  Drainage from Rt LE wound to be minimal to reduce infection rate.     Time  3    Period  Weeks    Status  Achieved      PT SHORT TERM GOAL #3   Title  PT to verbalize the importance of throwing compression garments out after a year in order to assure proper compression     Time  1    Period  Weeks    Status  Achieved      PT SHORT TERM GOAL #4   Title  Wound to be 100% granulated to allow proper healing     Time  3    Period  Weeks    Status  Achieved        PT Long Term Goals - 01/20/18 1328      PT LONG TERM GOAL #1   Title  Pt volume in Rt LE  to be decreased by 6 cm to decrease pain level to no greater than a 2/10 .     Time  6    Period  Weeks    Status  Achieved      PT LONG TERM GOAL #2   Title  PT wound drainage to be scant to reduce infection     Time  6    Period  Weeks    Status  Achieved      PT LONG TERM GOAL #3   Title  Pt wound to be no greater size than 2cm diameter to allow pt to feel conficent in self care.     Time  8    Period  Weeks    Status  Partially Met  Patient will benefit from skilled therapeutic intervention in order to improve the following deficits and impairments:     Visit Diagnosis: Lymphedema, not elsewhere classified  Open wound of left lower leg with complication, subsequent encounter  Open leg wound, right, subsequent encounter     Problem List Patient Active Problem List    Diagnosis Date Noted  . Varicose veins of right lower extremity with complications 67/73/7366  . Severe aortic stenosis 08/09/2017  . Vitamin D deficiency 12/02/2016  . Family history of prostate cancer in father 11/03/2016  . Status post gastric bypass for obesity 11/02/2016  . Subdural hematoma ([place]) 08/26/2016  . Syncope 07/13/2016  . HTN (hypertension) 04/13/2013  . Hyperlipemia 04/13/2013  . Atrial fibrillation ([place]) 02/14/2013  . Acute myocardial infarction ([place]) 06/25/2008  . EXOGENOUS OBESITY 06/18/2008  . Obstructive sleep apnea 06/18/2008   Teena Irani, PTA/CLT [phone]  Teena Irani 01/23/2018, 11:11 AM  [place] [place], [place], [address] Phone: [phone]   Fax:  [phone]  Name: Joel Rogers MRN: [id number] Date of Birth: March 09, 1951

## 2018-01-23 NOTE — Telephone Encounter (Signed)
Signed order received from MD for compression pump.  Order scanned into epic by [employer] operational officersecretary and faxed to [employer]. Lurena NidaAmy B Quintara Bost, PTA/CLT [phone]

## 2018-01-25 ENCOUNTER — Other Ambulatory Visit: Payer: Self-pay | Admitting: Family Medicine

## 2018-01-25 ENCOUNTER — Ambulatory Visit (HOSPITAL_COMMUNITY): Payer: Medicare Other | Admitting: Physical Therapy

## 2018-01-25 DIAGNOSIS — S81801D Unspecified open wound, right lower leg, subsequent encounter: Secondary | ICD-10-CM | POA: Diagnosis not present

## 2018-01-25 DIAGNOSIS — I890 Lymphedema, not elsewhere classified: Secondary | ICD-10-CM | POA: Diagnosis not present

## 2018-01-25 DIAGNOSIS — S81802D Unspecified open wound, left lower leg, subsequent encounter: Secondary | ICD-10-CM | POA: Diagnosis not present

## 2018-01-25 NOTE — Therapy (Signed)
[facility] [place], [place], [address] Phone: [phone]   Fax:  [phone]  Wound Care Therapy  Patient Details  Name: Joel Rogers MRN: [id number] Date of Birth: 22-Sep-1951 Referring Provider: Blanchie Serve    Encounter Date: 01/25/2018  PT End of Session - 01/25/18 0856    Visit Number  35    Number of Visits  38    Date for PT Re-Evaluation  02/09/18    Authorization Type  [employer] Medicare: recert on 7/03-5/00/93    Authorization - Visit Number  35    Authorization - Number of Visits  30    PT Start Time  0815    PT Stop Time  0840    PT Time Calculation (min)  25 min    Activity Tolerance  Patient tolerated treatment well;No increased pain    Behavior During Therapy  WFL for tasks assessed/performed       Past Medical History:  Diagnosis Date  . Arthritis   . Atrial fibrillation ([place])   . Diabetes mellitus without complication (HCC)    borderline  . Hyperlipidemia   . Hypertension   . Myocardial infarction ([place])   . Normal coronary arteries    by cardiac catheterization performed 03/14/06  . Obesity   . Sleep apnea    on CPAP  . Venous insufficiency     Past Surgical History:  Procedure Laterality Date  . CRANIOTOMY Right 08/27/2016   Procedure: CRANIOTOMY HEMATOMA EVACUATION SUBDURAL;  Surgeon: Erline Levine, MD;  Location: [facility];  Service: Neurosurgery;  Laterality: Right;  . GASTRIC BYPASS  09/2008  . JOINT REPLACEMENT  08/31/2006   right hip  . TOTAL HIP ARTHROPLASTY     right hip    There were no vitals filed for this visit.              Wound Therapy - 01/25/18 0842    Subjective  Pt comes today with his stocking for his Rt LE.  States no issues.    Patient and Family Stated Goals  wound to healling    Date of Onset  10/19/17    Prior Treatments  self care     Pain Assessment  No/denies pain    Evaluation and Treatment Procedures Explained to Patient/Family  Yes    Evaluation  and Treatment Procedures  agreed to    Wound Properties Date First Assessed: 11/03/17 Time First Assessed: 8182 Wound Type: Venous stasis ulcer;Other (Comment) Location: Leg Location Orientation: Right;Anterior Present on Admission: Yes   Dressing Type  Compression wrap;Impregnated gauze (bismuth)    Dressing Changed  Changed    Dressing Status  Old drainage    Dressing Change Frequency  PRN    Site / Wound Assessment  Granulation tissue;Yellow    % Wound base Red or Granulating  100%    % Wound base Yellow/Fibrinous Exudate  0%    % Wound base Black/Eschar  0%    Peri-wound Assessment  Edema;Hemosiderin    Margins  Attached edges (approximated)    Drainage Amount  Scant    Drainage Description  Serosanguineous    Non-staged Wound Description  Not applicable    Treatment  Cleansed;Debridement (Selective)    Selective Debridement - Location  primary wound    Selective Debridement - Tools Used  Forceps    Selective Debridement - Tissue Removed  dry skin perimeter of wound    Wound Therapy - Clinical Statement  pump order  received and faxed to [facility] 01/24/18.  wound with more reduction in size and approximation.  Now with 3 opened islands rather than one large wound as it is closing. Debrided edges and cleansed LE.  Able to apply xeroform with medipore and knee high garment over.  No issues and good fit with garment.  Instructed patient to let us know if any issues arise and we would go back to profore until wound is healed.     Factors Delaying/Impairing Wound Healing  Altered sensation;Vascular compromise    Hydrotherapy Plan  Debridement;Dressing change;Other (comment)    Wound Therapy - Frequency  3X / week    Wound Therapy - Current Recommendations  PT    Wound Plan  continue with appropriate woundcare and dressings.     Dressing   xeroform, 2X2 and medipore.  donned compression garment.    Manual Therapy  decongestive manual techniques to include supraclavicular, deep and superfical  abdominal, routing inguinal/axillary anastomosis and LE>                 PT Short Term Goals - 01/20/18 1328      PT SHORT TERM GOAL #1   Title  Pt Rt LE  volume to be decreased by 3 cm to allow improved environment for wound healing.     Time  3    Period  Weeks    Status  Achieved      PT SHORT TERM GOAL #2   Title  Drainage from Rt LE wound to be minimal to reduce infection rate.     Time  3    Period  Weeks    Status  Achieved      PT SHORT TERM GOAL #3   Title  PT to verbalize the importance of throwing compression garments out after a year in order to assure proper compression     Time  1    Period  Weeks    Status  Achieved      PT SHORT TERM GOAL #4   Title  Wound to be 100% granulated to allow proper healing     Time  3    Period  Weeks    Status  Achieved        PT Long Term Goals - 01/20/18 1328      PT LONG TERM GOAL #1   Title  Pt volume in Rt LE  to be decreased by 6 cm to decrease pain level to no greater than a 2/10 .     Time  6    Period  Weeks    Status  Achieved      PT LONG TERM GOAL #2   Title  PT wound drainage to be scant to reduce infection     Time  6    Period  Weeks    Status  Achieved      PT LONG TERM GOAL #3   Title  Pt wound to be no greater size than 2cm diameter to allow pt to feel conficent in self care.     Time  8    Period  Weeks    Status  Partially Met              Patient will benefit from skilled therapeutic intervention in order to improve the following deficits and impairments:     Visit Diagnosis: Lymphedema, not elsewhere classified  Open leg wound, right, subsequent encounter     Problem List Patient  Active Problem List   Diagnosis Date Noted  . Varicose veins of right lower extremity with complications 98/33/8250  . Severe aortic stenosis 08/09/2017  . Vitamin D deficiency 12/02/2016  . Family history of prostate cancer in father 11/03/2016  . Status post gastric bypass for obesity  11/02/2016  . Subdural hematoma ([place]) 08/26/2016  . Syncope 07/13/2016  . HTN (hypertension) 04/13/2013  . Hyperlipemia 04/13/2013  . Atrial fibrillation ([place]) 02/14/2013  . Acute myocardial infarction ([place]) 06/25/2008  . EXOGENOUS OBESITY 06/18/2008  . Obstructive sleep apnea 06/18/2008   Teena Irani, PTA/CLT [phone]  Teena Irani 01/25/2018, 8:57 AM  [facility] [address] [place], [place], [address] Phone: [phone]   Fax:  [phone]  Name: Joel Rogers MRN: [id number] Date of Birth: [birth year]

## 2018-01-26 DIAGNOSIS — G4733 Obstructive sleep apnea (adult) (pediatric): Secondary | ICD-10-CM | POA: Diagnosis not present

## 2018-01-27 ENCOUNTER — Ambulatory Visit (HOSPITAL_COMMUNITY): Payer: Medicare Other | Admitting: Physical Therapy

## 2018-01-27 ENCOUNTER — Encounter (HOSPITAL_COMMUNITY): Payer: Self-pay | Admitting: Physical Therapy

## 2018-01-27 ENCOUNTER — Other Ambulatory Visit: Payer: Self-pay

## 2018-01-27 DIAGNOSIS — I890 Lymphedema, not elsewhere classified: Secondary | ICD-10-CM

## 2018-01-27 DIAGNOSIS — S81802D Unspecified open wound, left lower leg, subsequent encounter: Secondary | ICD-10-CM | POA: Diagnosis not present

## 2018-01-27 DIAGNOSIS — S81801D Unspecified open wound, right lower leg, subsequent encounter: Secondary | ICD-10-CM

## 2018-01-27 NOTE — Therapy (Signed)
[place], [place], [address] Phone: [phone]   Fax:  [phone]  Wound Care Therapy  Patient Details  Name: Joel Rogers MRN: [id number] Date of Birth: 04-02-1951 Referring Provider: Blanchie Serve    Encounter Date: 01/27/2018  PT End of Session - 01/27/18 1204    Visit Number  36    Number of Visits  36    Date for PT Re-Evaluation  02/09/18    Authorization Type  [employer] Medicare: recert on 9/93-5/70/17    Authorization - Visit Number  34    Authorization - Number of Visits  36    PT Start Time  1115    PT Stop Time  1150    PT Time Calculation (min)  35 min    Activity Tolerance  Patient tolerated treatment well;No increased pain    Behavior During Therapy  WFL for tasks assessed/performed       Past Medical History:  Diagnosis Date  . Arthritis   . Atrial fibrillation ([place])   . Diabetes mellitus without complication (HCC)    borderline  . Hyperlipidemia   . Hypertension   . Myocardial infarction ([place])   . Normal coronary arteries    by cardiac catheterization performed 03/14/06  . Obesity   . Sleep apnea    on CPAP  . Venous insufficiency     Past Surgical History:  Procedure Laterality Date  . CRANIOTOMY Right 08/27/2016   Procedure: CRANIOTOMY HEMATOMA EVACUATION SUBDURAL;  Surgeon: Erline Levine, MD;  Location: [facility];  Service: Neurosurgery;  Laterality: Right;  . GASTRIC BYPASS  09/2008  . JOINT REPLACEMENT  08/31/2006   right hip  . TOTAL HIP ARTHROPLASTY     right hip    There were no vitals filed for this visit.     [facility] PT Assessment - 01/27/18 0001      Assessment   Medical Diagnosis  Rt lymphedema with weeping     Referring Provider  Blanchie Serve     Onset Date/Surgical Date  10/20/17    Next MD Visit  unknown     Prior Therapy  none       Precautions   Precautions  Other (comment)    Precaution Comments  contact       Restrictions   Weight Bearing Restrictions   No      Observation/Other Assessments   Other Surveys   -- Life impact score: 63 = 70% affected.         LYMPHEDEMA/ONCOLOGY QUESTIONNAIRE - 01/27/18 1155      Date Lymphedema/Swelling Started   Date  10/20/17      What other symptoms do you have   Are you Having Heaviness or Tightness  Yes    Are you having Pain  Yes    Are you having pitting edema  Yes    Is it Hard or Difficult finding clothes that fit  No    Do you have infections  Yes      Lymphedema Stage   Stage  STAGE 3 ELEPHANTIASIS      Lymphedema Assessments   Lymphedema Assessments  Lower extremities      Right Lower Extremity Lymphedema   30 cm Proximal to Floor at Lateral Plantar Foot  39.5 cm inital 49.7    20 cm Proximal to Floor at Lateral Plantar Foot  31.5 1 inital 37.3    10 cm Proximal to Floor at Lateral Malleoli  27  cm initial 29.7    Circumference of ankle/heel  36.7 cm. initial 38.8    5 cm Proximal to 1st MTP Joint  25 cm inital 26    Across MTP Joint  25.2 cm      Left Lower Extremity Lymphedema   30 cm Proximal to Floor at Lateral Plantar Foot  40 cm    20 cm Proximal to Floor at Lateral Plantar Foot  30 cm    10 cm Proximal to Floor at Lateral Malleoli  26.5 cm    Circumference of ankle/heel  35.8 cm.    5 cm Proximal to 1st MTP Joint  25.3 cm    Across MTP Joint  24 cm           Wound Therapy - 01/27/18 1158    Subjective  PT states that his wife puts his Rt stocking on as he is unable to don it.  He has gotten an e-mail stating that his pump is in processing     Patient and Family Stated Goals  wound to healling    Date of Onset  10/19/17    Prior Treatments  self care     Pain Assessment  No/denies pain    Evaluation and Treatment Procedures Explained to Patient/Family  Yes    Evaluation and Treatment Procedures  agreed to    Wound Properties Date First Assessed: 11/03/17 Time First Assessed: 3664 Wound Type: Venous stasis ulcer;Other (Comment) Location: Leg Location Orientation:  Right;Anterior Present on Admission: Yes   Dressing Type  Compression wrap;Impregnated gauze (bismuth)    Dressing Status  Old drainage    Dressing Change Frequency  PRN    Site / Wound Assessment  Granulation tissue;Yellow    % Wound base Red or Granulating  100%    % Wound base Yellow/Fibrinous Exudate  0%    % Wound base Black/Eschar  0%    Peri-wound Assessment  Edema;Hemosiderin    Wound Length (cm)  2.5 cm    Wound Width (cm)  1.2 cm    Wound Surface Area (cm^2)  3 cm^2    Margins  Attached edges (approximated)    Drainage Amount  Scant    Drainage Description  Serosanguineous    Non-staged Wound Description  Not applicable    Wound Therapy - Clinical Statement  Therapist feels pt and wife are able to take care of wound at this point and patient agrees.  Therapist instructed patient on donning his own compression sock with a Melina Copa.  Pt had difficulty but was able to complete.  PT verbalized that he was going to purchase a butler and practice to be I in donniing his own compression garments.  Compression pump order has been recieved and pt should recieve pump nest week.  PT is ready for discharge.     Wound Therapy - Functional Problem List  Pain causing diffiiculty in walking     Factors Delaying/Impairing Wound Healing  Altered sensation;Vascular compromise    Hydrotherapy Plan  Dressing change;Other (comment);Patient/family education    Wound Therapy - Frequency  3X / week    Wound Therapy - Current Recommendations  PT    Wound Plan  Discharge pt to self care.     Dressing   xeroform, 2X2 and medipore.  donned compression garment.    Manual Therapy  decongestive manual techniques to include supraclavicular, deep and superfical abdominal, routing inguinal/axillary anastomosis and LE>  PT Education - 01/27/18 1203    Education provided  Yes    Education Details  Donning of compression garment using Butle aid.     Person(s) Educated  Patient    Methods   Explanation;Demonstration;Tactile cues    Comprehension  Verbalized understanding;Returned demonstration       PT Short Term Goals - 01/27/18 1205      PT SHORT TERM GOAL #1   Title  Pt Rt LE  volume to be decreased by 3 cm to allow improved environment for wound healing.     Time  3    Period  Weeks    Status  Achieved      PT SHORT TERM GOAL #2   Title  Drainage from Rt LE wound to be minimal to reduce infection rate.     Time  3    Period  Weeks    Status  Achieved      PT SHORT TERM GOAL #3   Title  PT to verbalize the importance of throwing compression garments out after a year in order to assure proper compression     Time  1    Period  Weeks    Status  Achieved      PT SHORT TERM GOAL #4   Title  Wound to be 100% granulated to allow proper healing     Time  3    Period  Weeks    Status  Achieved        PT Long Term Goals - 01/27/18 1205      PT LONG TERM GOAL #1   Title  Pt volume in Rt LE  to be decreased by 6 cm to decrease pain level to no greater than a 2/10 .     Time  6    Period  Weeks    Status  Achieved      PT LONG TERM GOAL #2   Title  PT wound drainage to be scant to reduce infection     Time  6    Period  Weeks    Status  Achieved      PT LONG TERM GOAL #3   Title  Pt wound to be no greater size than 2cm diameter to allow pt to feel conficent in self care.     Time  8    Period  Weeks    Status  Achieved            Plan - 01/27/18 1204    Clinical Impression Statement  as above     Rehab Potential  Good    PT Frequency  2x / week    PT Duration  Other (comment) 16 weeks - additional 4 weeks from second cert.     PT Treatment/Interventions  Patient/family education;Manual techniques;Manual lymph drainage;Compression bandaging;Other (comment) debridement and dressing change of wound     PT Next Visit Plan  Pt is ready for discharge.     Consulted and Agree with Plan of Care  Patient       Patient will benefit from skilled  therapeutic intervention in order to improve the following deficits and impairments:  Abnormal gait, Decreased activity tolerance, Obesity, Pain, Increased edema, Impaired sensation, Difficulty walking  Visit Diagnosis: Lymphedema, not elsewhere classified  Open leg wound, right, subsequent encounter     Problem List Patient Active Problem List   Diagnosis Date Noted  . Varicose veins of right lower extremity with complications 81/19/1478  .  Severe aortic stenosis 08/09/2017  . Vitamin D deficiency 12/02/2016  . Family history of prostate cancer in father 11/03/2016  . Status post gastric bypass for obesity 11/02/2016  . Subdural hematoma ([place]) 08/26/2016  . Syncope 07/13/2016  . HTN (hypertension) 04/13/2013  . Hyperlipemia 04/13/2013  . Atrial fibrillation ([place]) 02/14/2013  . Acute myocardial infarction ([place]) 06/25/2008  . EXOGENOUS OBESITY 06/18/2008  . Obstructive sleep apnea 06/18/2008  Rayetta Humphrey, PT CLT [phone] 01/27/2018, 12:06 PM  [place], [place], [address] Phone: [phone]   Fax:  [phone]  Name: NAGEE GOATES MRN: [id number] Date of Birth: Nov 22, 1951  PHYSICAL THERAPY DISCHARGE SUMMARY  Visits from Start of Care: 36  Current functional level related to goals / functional outcomes: See above    Remaining deficits: See above    Education / Equipment: Donning of garment; use of compression pump Plan: Patient agrees to discharge.  Patient goals were met. Patient is being discharged due to meeting the stated rehab goals.  ?????        Rayetta Humphrey, [place] CLT [phone]

## 2018-01-30 ENCOUNTER — Encounter: Payer: Self-pay | Admitting: Family Medicine

## 2018-01-30 ENCOUNTER — Ambulatory Visit (HOSPITAL_COMMUNITY): Payer: Medicare Other | Admitting: Physical Therapy

## 2018-02-01 ENCOUNTER — Ambulatory Visit (HOSPITAL_COMMUNITY): Payer: Medicare Other

## 2018-02-02 ENCOUNTER — Telehealth: Payer: Self-pay

## 2018-02-02 NOTE — Telephone Encounter (Signed)
  Pt said losartan has been recalled and we need to fax something else to the Pharmacy - [facility] per the Pharmacy      Disp Refills Start End   losartan (COZAAR) 50 MG tablet 90 tablet 0 01/25/2018    Sig: TAKE 1 TABLET BY MOUTH ONCE DAILY   Sent to pharmacy as: losartan (COZAAR) 50 MG tablet   E-Prescribing Status: Receipt confirmed by pharmacy (01/25/2018 8:54 AM EST)

## 2018-02-03 ENCOUNTER — Ambulatory Visit (HOSPITAL_COMMUNITY): Payer: Medicare Other

## 2018-02-03 MED ORDER — OLMESARTAN MEDOXOMIL 20 MG PO TABS: 20.0000 mg | ORAL_TABLET | Freq: Every day | ORAL | 0 refills | Status: DC

## 2018-02-03 NOTE — Telephone Encounter (Signed)
Done

## 2018-02-03 NOTE — Telephone Encounter (Signed)
Change him to Benicar 20 mg a day.  90 pills, no refills.

## 2018-02-03 NOTE — Addendum Note (Signed)
Addended by: Mack HookJOHNSON, Tiny Rietz on: 02/03/2018 01:39 PM   Modules accepted: Orders

## 2018-02-06 ENCOUNTER — Ambulatory Visit (HOSPITAL_COMMUNITY): Payer: Medicare Other | Admitting: Physical Therapy

## 2018-02-08 ENCOUNTER — Ambulatory Visit (HOSPITAL_COMMUNITY): Payer: Medicare Other | Admitting: Physical Therapy

## 2018-02-09 ENCOUNTER — Other Ambulatory Visit: Payer: Self-pay

## 2018-02-09 ENCOUNTER — Ambulatory Visit (HOSPITAL_COMMUNITY): Payer: Medicare Other | Attending: Cardiology

## 2018-02-09 DIAGNOSIS — I482 Chronic atrial fibrillation, unspecified: Secondary | ICD-10-CM

## 2018-02-09 DIAGNOSIS — I119 Hypertensive heart disease without heart failure: Secondary | ICD-10-CM | POA: Insufficient documentation

## 2018-02-09 DIAGNOSIS — I350 Nonrheumatic aortic (valve) stenosis: Secondary | ICD-10-CM

## 2018-02-09 DIAGNOSIS — I083 Combined rheumatic disorders of mitral, aortic and tricuspid valves: Secondary | ICD-10-CM | POA: Diagnosis not present

## 2018-02-10 ENCOUNTER — Ambulatory Visit (HOSPITAL_COMMUNITY): Payer: Medicare Other

## 2018-02-13 ENCOUNTER — Ambulatory Visit (HOSPITAL_COMMUNITY): Payer: Medicare Other | Admitting: Physical Therapy

## 2018-02-15 ENCOUNTER — Ambulatory Visit (HOSPITAL_COMMUNITY): Payer: Medicare Other | Admitting: Physical Therapy

## 2018-02-15 ENCOUNTER — Encounter: Payer: Self-pay | Admitting: Cardiovascular Disease

## 2018-02-15 ENCOUNTER — Ambulatory Visit: Payer: Medicare Other | Admitting: Cardiovascular Disease

## 2018-02-15 ENCOUNTER — Other Ambulatory Visit: Payer: Self-pay | Admitting: Cardiovascular Disease

## 2018-02-15 VITALS — BP 146/82 | HR 68 | Ht 72.0 in | Wt 315.8 lb

## 2018-02-15 DIAGNOSIS — E7800 Pure hypercholesterolemia, unspecified: Secondary | ICD-10-CM

## 2018-02-15 DIAGNOSIS — I10 Essential (primary) hypertension: Secondary | ICD-10-CM | POA: Diagnosis not present

## 2018-02-15 DIAGNOSIS — I482 Chronic atrial fibrillation, unspecified: Secondary | ICD-10-CM

## 2018-02-15 DIAGNOSIS — I4819 Other persistent atrial fibrillation: Secondary | ICD-10-CM

## 2018-02-15 DIAGNOSIS — G4733 Obstructive sleep apnea (adult) (pediatric): Secondary | ICD-10-CM

## 2018-02-15 DIAGNOSIS — I350 Nonrheumatic aortic (valve) stenosis: Secondary | ICD-10-CM

## 2018-02-15 DIAGNOSIS — I272 Pulmonary hypertension, unspecified: Secondary | ICD-10-CM | POA: Diagnosis not present

## 2018-02-15 NOTE — Progress Notes (Addendum)
02/15/2018 Joel Leachalph L Blauvelt   [birth year]  [id number]  Primary Physician Eustace MooreNelson, Yvonne Sue, MD Primary Cardiologist: Runell GessJonathan J Audel Coakley MD FACP, [place]ACC, [place]HA, [place]  HPI:  Joel Rogers is a 67 y.o.  severely overweight, married, African American male, father of 2, grandfather to 4 grandchildren who I last saw 07/13/17. He has a history of chronic atrial fibrillation, rate controlled on Coumadin anticoagulation. He has obstructive sleep apnea on CPAP, hypertension, and hyperlipidemia. He was catheterized by Dr. Lavonne ChickBill Gamble in 2007 and found to have normal coronary arteries and normal coronary function. He had laparoscopic Roux-en-Y gastric [facility] and has lost over 140 pounds. He feels clinically improved. His last lipid profile performed a year ago was excellent with an LDL of 82, HDL of 52, total cholesterol 142. He complains of right greater than left lower extremity swelling, which has been chronic. He has some venous reflux in his lesser saphenous vein, probably not amenable to endovenous ablation. Since I saw him 9 months ago he's remained stable. He recently had an episode of syncope in the morning while getting out of bed without other symptoms. [facility] where his workup was unrevealing. He did apparently hit his head and soft with developed a subdural hematoma followed by Dr. Venetia MaxonStern. This has improved over time. Oral anticoagulation was discontinued as a result of this. Since I saw him in in August of last year he has remained stable. He does have severe aortic stenosis and just remained stable table by 2-D echo performed 02/09/18 with an aortic valve area of 0.88 cm and a peak gradient is 76 millimeters of mercury.. He does have severe pulmonary hypertension with a pulmonary artery pressure of 92 mmHg.    Current Meds  Medication Sig  . aspirin EC 81 MG tablet Take 1 tablet (81 mg total) by mouth daily.  Marland Kitchen.  atorvastatin (LIPITOR) 80 MG tablet TAKE ONE TABLET BY MOUTH ONCE DAILY WITH BREAKFAST  . Calcium Carbonate-Vitamin D (CALCIUM + D PO) Take 1 tablet by mouth daily.  . colchicine 0.6 MG tablet Take 0.6 mg by mouth as directed.  Marland Kitchen. ibuprofen (ADVIL,MOTRIN) 800 MG tablet TAKE 1 TABLET BY MOUTH EVERY 8 HOURS AS NEEDED FOR MODERATE PAIN  . losartan (COZAAR) 50 MG tablet TAKE 1 TABLET BY MOUTH ONCE DAILY  . metoprolol (LOPRESSOR) 100 MG tablet Take 1 tablet (100 mg total) by mouth 2 (two) times daily. (Patient taking differently: Take 50 mg by mouth 2 (two) times daily. )  . torsemide (DEMADEX) 20 MG tablet Take 1 tablet (20 mg total) by mouth daily.  . Vitamin D, Ergocalciferol, (DRISDOL) 50000 units CAPS capsule TAKE ONE CAPSULE BY MOUTH EVERY 7 DAYS     No Known Allergies  Social History   Socioeconomic History  . Marital status: Married    Spouse name: Adela LankJacqueline  . Number of children: 2  . Years of education: 5516  . Highest education level: Not on file  Occupational History  . Occupation: Sports administratormaint mechanic    Comment: disability retirement  Social Needs  . Financial resource strain: Not on file  . Food insecurity:    Worry: Not on file    Inability: Not on file  . Transportation needs:    Medical: Not on file    Non-medical: Not on file  Tobacco Use  . Smoking status: Never Smoker  . Smokeless tobacco: Never Used  Substance and Sexual Activity  . Alcohol use: No  . Drug use: No  . Sexual activity: Not Currently  Lifestyle  . Physical activity:    Days per week: Not on file    Minutes per session: Not on file  . Stress: Not on file  Relationships  . Social connections:    Talks on phone: Not on file    Gets together: Not on file    Attends religious service: Not on file    Active member of club or organization: Not on file    Attends meetings of clubs or organizations: Not on file    Relationship status: Not on file  . Intimate partner violence:    Fear of current or ex  partner: Not on file    Emotionally abused: Not on file    Physically abused: Not on file    Forced sexual activity: Not on file  Other Topics Concern  . Not on file  Social History Narrative   Doctorate in ministry   Retired Data processing manager   Disability from hip replacement   Lives with wife [name]   Exercises at the [employer] - water exercise     Review of Systems: General: negative for chills, fever, night sweats or weight changes.  Cardiovascular: negative for chest pain, dyspnea on exertion, edema, orthopnea, palpitations, paroxysmal nocturnal dyspnea or shortness of breath Dermatological: negative for rash Respiratory: negative for cough or wheezing Urologic: negative for hematuria Abdominal: negative for nausea, vomiting, diarrhea, bright red blood per rectum, melena, or hematemesis Neurologic: negative for visual changes, syncope, or dizziness All other systems reviewed and are otherwise negative except as noted above.    Blood pressure (!) 146/82, pulse 68, height 6' (1.829 m), weight (!) 315 lb 12.8 oz (143.2 kg).  General appearance: alert and no distress Neck: no adenopathy, no JVD, supple, symmetrical, trachea midline, thyroid not enlarged, symmetric, no tenderness/mass/nodules and bilateral carotid bruits versus transmitted murmur Lungs: clear to auscultation bilaterally Heart: 2/6 systolic ejection murmur heard at the base consistent with severe aortic stenosis. Extremities: 23+ pitting edema probably related to lymphedema with severe pulmonary hypertension wearing compression stockings. Pulses: 2+ and symmetric Skin: Skin color, texture, turgor normal. No rashes or lesions Neurologic: Alert and oriented X 3, normal strength and tone. Normal symmetric reflexes. Normal coordination and gait  EKG atrial fibrillation with a ventricular response 68 and right bundle-branch block with left axis deviation. I personally reviewed this EKG.  ASSESSMENT AND PLAN:    Obstructive sleep apnea History of obstructive sleep apnea ordinarily on CPAP however he has issues with his machine currently which we will address. E has morbid obesity and symptoms of sleep apnea although he has not had a sleep study never 20 years which we will need to repeat  Atrial fibrillation History of chronic A. Fib rate controlled not on oral anticoagulation because of a subdural hematoma.  Hyperlipemia History of hyperlipidemia on statin therapy with lipid profile performed 09/09/17 revealing total cholesterol 139 with an HDL of 53.  HTN (hypertension) History of essential hypertension blood pressure measured at 146/82. He is on losartan and metoprolol. Continue current meds at current dosing.  Severe aortic stenosis History of severe aortic stenosis with recent 2-D echo performed 02/09/18 revealing an aortic valve area of 0.887 m with a peak gradient of 76 mmHg. Currently he does not have significant symptoms which would necessitate right left heart catheterization consideration of aortic valve replacement but we will continue to follow him every  6 months.  Pulmonary hypertension, unspecified (HCC) Recent 2-D echo revealed severely increased PA pressures measured at 92 mmHg. I suspect this is multifactorial from severe aortic stenosis and obstructive sleep apnea.      Runell Gess MD FACP,FACC,FAHA, [facility] 02/15/2018 10:14 AM

## 2018-02-15 NOTE — Patient Instructions (Signed)
Medication Instructions: Your physician recommends that you continue on your current medications as directed. Please refer to the Current Medication list given to you today.   Testing/Procedures: Your physician has requested that you have an echocardiogram in 1 year. Echocardiography is a painless test that uses sound waves to create images of your heart. It provides your doctor with information about the size and shape of your heart and how well your heart's chambers and valves are working. This procedure takes approximately one hour. There are no restrictions for this procedure.  Follow-Up: Your physician wants you to follow-up in: 6 months with Dr. Berry. You will receive a reminder letter in the mail two months in advance. If you don't receive a letter, please call our office to schedule the follow-up appointment.  If you need a refill on your cardiac medications before your next appointment, please call your pharmacy.  

## 2018-02-15 NOTE — Assessment & Plan Note (Signed)
History of severe aortic stenosis with recent 2-D echo performed 02/09/18 revealing an aortic valve area of 0.887 m with a peak gradient of 76 mmHg. Currently he does not have significant symptoms which would necessitate right left heart catheterization consideration of aortic valve replacement but we will continue to follow him every 6 months.

## 2018-02-15 NOTE — Assessment & Plan Note (Signed)
History of chronic A. Fib rate controlled not on oral anticoagulation because of a subdural hematoma.

## 2018-02-15 NOTE — Assessment & Plan Note (Signed)
Recent 2-D echo revealed severely increased PA pressures measured at 92 mmHg. I suspect this is multifactorial from severe aortic stenosis and obstructive sleep apnea.

## 2018-02-15 NOTE — Assessment & Plan Note (Addendum)
History of obstructive sleep apnea ordinarily on CPAP however he has issues with his machine currently which we will address. E has morbid obesity and symptoms of sleep apnea although he has not had a sleep study never 20 years which we will need to repeat

## 2018-02-15 NOTE — Assessment & Plan Note (Signed)
History of essential hypertension blood pressure measured at 146/82. He is on losartan and metoprolol. Continue current meds at current dosing.

## 2018-02-15 NOTE — Assessment & Plan Note (Signed)
History of hyperlipidemia on statin therapy with lipid profile performed 09/09/17 revealing total cholesterol 139 with an HDL of 53.

## 2018-02-17 ENCOUNTER — Ambulatory Visit (HOSPITAL_COMMUNITY): Payer: Medicare Other | Admitting: Physical Therapy

## 2018-03-01 ENCOUNTER — Encounter (HOSPITAL_BASED_OUTPATIENT_CLINIC_OR_DEPARTMENT_OTHER): Payer: Medicare Other

## 2018-03-13 ENCOUNTER — Ambulatory Visit: Payer: Medicare Other | Admitting: Family Medicine

## 2018-03-13 ENCOUNTER — Other Ambulatory Visit: Payer: Self-pay

## 2018-03-13 ENCOUNTER — Ambulatory Visit (INDEPENDENT_AMBULATORY_CARE_PROVIDER_SITE_OTHER): Payer: Medicare Other | Admitting: Family Medicine

## 2018-03-13 ENCOUNTER — Encounter: Payer: Self-pay | Admitting: Family Medicine

## 2018-03-13 VITALS — BP 130/80 | HR 83 | Temp 97.7°F | Resp 14 | Ht 72.0 in | Wt 310.0 lb

## 2018-03-13 DIAGNOSIS — R0602 Shortness of breath: Secondary | ICD-10-CM | POA: Diagnosis not present

## 2018-03-13 DIAGNOSIS — Z6841 Body Mass Index (BMI) 40.0 and over, adult: Secondary | ICD-10-CM | POA: Diagnosis not present

## 2018-03-13 DIAGNOSIS — I482 Chronic atrial fibrillation, unspecified: Secondary | ICD-10-CM

## 2018-03-13 DIAGNOSIS — I350 Nonrheumatic aortic (valve) stenosis: Secondary | ICD-10-CM | POA: Diagnosis not present

## 2018-03-13 DIAGNOSIS — I10 Essential (primary) hypertension: Secondary | ICD-10-CM

## 2018-03-13 DIAGNOSIS — E66813 Obesity, class 3: Secondary | ICD-10-CM

## 2018-03-13 MED ORDER — TORSEMIDE 20 MG PO TABS: 20.0000 mg | ORAL_TABLET | Freq: Every day | ORAL | 1 refills | Status: DC

## 2018-03-13 MED ORDER — DILTIAZEM HCL ER COATED BEADS 120 MG PO CP24: 120.0000 mg | ORAL_CAPSULE | Freq: Every day | ORAL | Status: DC

## 2018-03-13 NOTE — Patient Instructions (Signed)
Need to re evaluate for the CPAP  It is important for your heart health  I will contact Dr Allyson SabalBerry about the heart valve operation  Stop the losartan Take the cardizem in its place Check the blood pressure daily during this change Call if the BP stays over 140/90 Be sure to take your BP resting  I will contact you about the cardiology opinion  Follow up with primary care in 6 months

## 2018-03-13 NOTE — Progress Notes (Signed)
Chief Complaint  Patient presents with  . discuss breathing issues   Patient is here at his request to discuss his "breathing issues".  He is progressively more short of breath with any exertion.  He states it is limiting his ability to exercise.  He states it is limiting his ability to lose weight.  He has lost 5 pounds since his last visit.  He states that he takes his Demadex 1 a day, sometimes 2.  He like me to change his prescription so he can take an extra pill at times.  I checked his lab work and he does have normal renal function.  Extra Demadex when he feels increase in his fluid is safe. He feels like he is on too much medicine.  He stopped his Cozaar for a week and states he felt much better.  He states his blood pressure stayed under 140/90.  He read on the Internet that Cozaar causes cancer.  I looked up Cozaar in the up-to-date section and showed him the side effects.  No cancer is listed.  I told him he cannot believe everything he sees on the Internet.  In spite of this he chooses not to stay on Cozaar.  I do not want him to stop his on medication, he has significant heart and lung disease and should not allow his blood pressure to be elevated.  We will going to put him on a calcium channel blocker instead.  He is going to check his blood pressure every day.  Proper method for blood pressure treatment is discussed. He has not altered his metoprolol.  I warned him not to stop this medicine suddenly or he would have rebound tachycardia. He has had gastric bypass.  He is good about his vitamin supplementation.  He has difficulty losing weight beyond 300 pounds.  I congratulated him on his recent loss of a few pounds, although I suspect is because he took an extra Demadex today. He continues to have a lot of pedal edema.  He has venous insufficiency.  He states he can see a vascular specialist to consult whether any procedure will help. His health maintenance is up-to-date except for his  tetanus shot.  He declines getting a tetanus shot today. We discussed that I am leaving this practice and he needs to find another PCP.  He acknowledges this and has a list of doctors to choose from.  He is advised he needs to call ASAP Patient Active Problem List   Diagnosis Date Noted  . Pulmonary hypertension, unspecified (HCC) 02/15/2018  . Varicose veins of right lower extremity with complications 01/10/2018  . Severe aortic stenosis 08/09/2017  . Vitamin D deficiency 12/02/2016  . Family history of prostate cancer in father 11/03/2016  . Status post gastric bypass for obesity 11/02/2016  . Subdural hematoma (HCC) 08/26/2016  . Syncope 07/13/2016  . HTN (hypertension) 04/13/2013  . Hyperlipemia 04/13/2013  . Atrial fibrillation (HCC) 02/14/2013  . Acute myocardial infarction (HCC) 06/25/2008  . EXOGENOUS OBESITY 06/18/2008  . Obstructive sleep apnea 06/18/2008    Outpatient Encounter Medications as of 03/13/2018  Medication Sig  . aspirin EC 81 MG tablet Take 1 tablet (81 mg total) by mouth daily.  Marland Kitchen atorvastatin (LIPITOR) 80 MG tablet TAKE ONE TABLET BY MOUTH ONCE DAILY WITH BREAKFAST  . Calcium Carbonate-Vitamin D (CALCIUM + D PO) Take 1 tablet by mouth daily.  . colchicine 0.6 MG tablet Take 0.6 mg by mouth daily as needed (gout flare).   Marland Kitchen  ibuprofen (ADVIL,MOTRIN) 800 MG tablet TAKE 1 TABLET BY MOUTH EVERY 8 HOURS AS NEEDED FOR MODERATE PAIN  . metoprolol (LOPRESSOR) 100 MG tablet Take 1 tablet (100 mg total) by mouth 2 (two) times daily. (Patient taking differently: Take 50 mg by mouth 2 (two) times daily. )  . torsemide (DEMADEX) 20 MG tablet Take 1 tablet (20 mg total) by mouth daily. If needed may take two  . Vitamin D, Ergocalciferol, (DRISDOL) 50000 units CAPS capsule TAKE ONE CAPSULE BY MOUTH EVERY 7 DAYS  . diltiazem (CARDIZEM CD) 120 MG 24 hr capsule Take 1 capsule (120 mg total) by mouth daily.   No facility-administered encounter medications on file as of 03/13/2018.      No Known Allergies  Review of Systems  Constitutional: Positive for unexpected weight change. Negative for activity change, appetite change, diaphoresis and fatigue.       Lost 5 pounds  HENT: Negative for congestion, dental problem, postnasal drip, rhinorrhea, sinus pressure and sneezing.   Eyes: Negative for photophobia and visual disturbance.  Respiratory: Positive for shortness of breath. Negative for cough, chest tightness and wheezing.        With exertion, worsening  Cardiovascular: Positive for leg swelling. Negative for chest pain and palpitations.       Chronic  Gastrointestinal: Negative for constipation and diarrhea.  Genitourinary: Negative for difficulty urinating and frequency.  Musculoskeletal: Positive for arthralgias and gait problem.       Chronic pain in hip limits ambulation  Neurological: Negative for dizziness and headaches.  Psychiatric/Behavioral: Negative for dysphoric mood. The patient is not nervous/anxious.     Physical Exam  Constitutional: He is oriented to person, place, and time. He appears well-developed and well-nourished. No distress.  Morbid obesity  HENT:  Head: Normocephalic and atraumatic.  Nose: Nose normal.  Mouth/Throat: Oropharynx is clear and moist. No oropharyngeal exudate.  Eyes: Pupils are equal, round, and reactive to light. Conjunctivae are normal.  Neck: Normal range of motion. No JVD present.  Cardiovascular: Normal rate.  Murmur heard. Prominent systolic murmur  Pulmonary/Chest: Effort normal and breath sounds normal. No respiratory distress. He has no wheezes. He has no rales. He exhibits no tenderness.  Abdominal: Soft. Bowel sounds are normal.  Protuberant abdomen with large pannus  Musculoskeletal: He exhibits edema.  Compression stockings in place  Neurological: He is alert and oriented to person, place, and time.  Psychiatric: He has a normal mood and affect. His behavior is normal. Thought content normal.    BP  130/80   Pulse 83   Temp 97.7 F (36.5 C) (Oral)   Resp 14   Ht 6' (1.829 m)   Wt (!) 310 lb 0.6 oz (140.6 kg)   SpO2 95%   BMI 42.05 kg/m     ASSESSMENT/PLAN:  1. Essential hypertension Well-controlled, unfortunately patient believes what he has read on the Internet that his blood pressure medicine will cause cancer.  He wishes to discontinue Cozaar.  We will try him on a calcium channel blocker, diltiazem  2. Severe aortic stenosis Under care of cardiology, Dr. Nanetta BattyJonathan Berry.  Dr. Allyson SabalBerry is sent a message with [name] concerns.  3. Chronic atrial fibrillation (HCC) Controlled rate.  Is unable to take anticoagulants because of falls and intracerebral bleeding  4. SOB (shortness of breath) Chronic, worsening  5. Class 3 severe obesity due to excess calories with serious comorbidity and body mass index (BMI) of 40.0 to 44.9 in adult [facility](HCC) Status post gastric bypass  surgery.  Unable to exercise because of orthopedic limitations.  Weight fluctuates, but currently has lost weight   Patient Instructions  Need to re evaluate for the CPAP  It is important for your heart health  I will contact Dr Allyson Sabal about the heart valve operation  Stop the losartan Take the cardizem in its place Check the blood pressure daily during this change Call if the BP stays over 140/90 Be sure to take your BP resting  I will contact you about the cardiology opinion  Follow up with primary care in 6 months   Eustace Moore, MD

## 2018-03-14 ENCOUNTER — Telehealth: Payer: Self-pay | Admitting: *Deleted

## 2018-03-14 NOTE — Telephone Encounter (Signed)
Patient returned a call and was given the time and date of his sleep study.

## 2018-03-21 ENCOUNTER — Ambulatory Visit: Payer: Medicare Other | Attending: Cardiovascular Disease | Admitting: Cardiovascular Disease

## 2018-03-21 DIAGNOSIS — I4891 Unspecified atrial fibrillation: Secondary | ICD-10-CM | POA: Diagnosis not present

## 2018-03-21 DIAGNOSIS — G4733 Obstructive sleep apnea (adult) (pediatric): Secondary | ICD-10-CM

## 2018-03-21 DIAGNOSIS — I4819 Other persistent atrial fibrillation: Secondary | ICD-10-CM

## 2018-03-21 DIAGNOSIS — R400 Somnolence: Secondary | ICD-10-CM | POA: Insufficient documentation

## 2018-03-21 DIAGNOSIS — G4719 Other hypersomnia: Secondary | ICD-10-CM

## 2018-03-21 DIAGNOSIS — I10 Essential (primary) hypertension: Secondary | ICD-10-CM

## 2018-03-27 ENCOUNTER — Ambulatory Visit: Payer: Medicare Other | Admitting: Family Medicine

## 2018-03-30 ENCOUNTER — Encounter: Payer: Self-pay | Admitting: *Deleted

## 2018-04-03 ENCOUNTER — Other Ambulatory Visit: Payer: Self-pay | Admitting: Family Medicine

## 2018-04-05 ENCOUNTER — Other Ambulatory Visit: Payer: Self-pay | Admitting: Family Medicine

## 2018-04-06 ENCOUNTER — Other Ambulatory Visit: Payer: Self-pay

## 2018-04-06 NOTE — Telephone Encounter (Signed)
Fax came through for a refill on motrin patient has not been seen in this office.Refill denied

## 2018-04-09 NOTE — Procedures (Signed)
[facility]    Patient Name: Joel Rogers, Joel Rogers Date: 03/21/2018 Gender: Male D.O.B: August 20, 1951 Age (years): 67 Referring Provider: Runell Gess Height (inches): 72 Interpreting Physician: Nicki Guadalajara MD, ABSM Weight (lbs): 315 RPSGT: Peak, Robert BMI: 43 MRN: [id number] Neck Size: 17.00  CLINICAL INFORMATION Sleep Study Type: NPSG  Indication for sleep study: Hypertension, OSA  Epworth Sleepiness Score: 10  SLEEP STUDY TECHNIQUE As per the AASM Manual for the Scoring of Sleep and Associated Events v2.3 (April 2016) with a hypopnea requiring 4% desaturations.  The channels recorded and monitored were frontal, central and occipital EEG, electrooculogram (EOG), submentalis EMG (chin), nasal and oral airflow, thoracic and abdominal wall motion, anterior tibialis EMG, snore microphone, electrocardiogram, and pulse oximetry.  MEDICATIONS     aspirin EC 81 MG tablet             atorvastatin (LIPITOR) 80 MG tablet         Calcium Carbonate-Vitamin D (CALCIUM + D PO)         colchicine 0.6 MG tablet         diltiazem (CARDIZEM CD) 120 MG 24 hr capsule         ibuprofen (ADVIL,MOTRIN) 800 MG tablet         metoprolol (LOPRESSOR) 100 MG tablet         torsemide (DEMADEX) 20 MG tablet         Vitamin D, Ergocalciferol, (DRISDOL) 50000 units CAPS capsule      Medications self-administered by patient taken the night of the study : N/A  SLEEP ARCHITECTURE The study was initiated at 9:50:20 PM and ended at 4:01:41 AM.  Sleep onset time was 370.2 minutes and the sleep efficiency was 0.3%%. The total sleep time was 1 minutes.  Stage REM latency was N/A minutes.  The patient spent 0.0%% of the night in stage N1 sleep, 50.0%% in stage N2 sleep, 50.0%% in stage N3 and 0.00% in REM.  Alpha intrusion was absent.  Supine sleep was 100.00%.  RESPIRATORY PARAMETERS The overall apnea/hypopnea index (AHI) was 0.0 per hour. There were 0 total apneas, including 0  obstructive, 0 central and 0 mixed apneas. There were 0 hypopneas and 0 RERAs.  The AHI during Stage REM sleep was N/A per hour. Inadequate sleep during the study with a sleep efficiency at 0.3% and total sleep time 1.0 minutes  AHI while supine was 0.0 per hour.  The mean oxygen saturation was 94.3%. The minimum SpO2 during sleep was 91.0%.  No snoring was noted during this study.  CARDIAC DATA The 2 lead EKG demonstrated sinus rhythm. The mean heart rate was 82.4 beats per minute. Other EKG findings include: PVCs.  LEG MOVEMENT DATA The total PLMS were 0 with a resulting PLMS index of 0.0. Associated arousal with leg movement index was 0.0 .  IMPRESSIONS - Inadequate sleep study evaluation with inability of the patient to achieve sleep. The patient slept for only1 minute out of 371.4 minutes recording time.  Sleep efficiency was 0.3%.  Attempts were made for the patient to try to sleep in bed and in a recliner.  The study was terminated due to lack of sleep. - The patient had minimal or no oxygen desaturation during the study (Min O2 91.0%) - No snoring was audible during this study. - EKG findings include PVCs. - Clinically significant periodic limb movements did not occur during sleep. No significant associated arousals.  DIAGNOSIS - Daytime sleepiness  RECOMMENDATIONS -This was an  inadequate sleep evaluation due to lack of sleep (sleep efficiency at 0.3%). If patient continues to be symptomatic, would recommend a sleep aid prior to sleep evaluation and consider a possible home study. - Effort should be made to optimize nasal and oral pharyngeal patency. - Avoid alcohol, sedatives and other CNS depressants that may worsen sleep apnea and disrupt normal sleep architecture. - Sleep hygiene should be reviewed to assess factors that may improve sleep quality. - Weight management and regular exercise should be initiated or continued if appropriate.  [Electronically signed] 04/09/2018  10:26 PM  Nicki Guadalajara MD, [facility], ABSM Diplomate, American Board of Sleep Medicine   NPI: [id number] [facility] PH: [phone]   FX: [phone] ACCREDITED BY THE AMERICAN ACADEMY OF SLEEP MEDICINE

## 2018-04-12 NOTE — Progress Notes (Signed)
04/12/18 LMTCB

## 2018-04-13 ENCOUNTER — Telehealth: Payer: Self-pay | Admitting: *Deleted

## 2018-04-13 NOTE — Progress Notes (Signed)
Spoke with patient. He feels he cannot sleep w/o a CPAP mask. Does not want to take a sleep aid. Would prefer to try HST,

## 2018-04-13 NOTE — Telephone Encounter (Signed)
Spoke with patient informing him of sleep study results and recommendations. Patient states that he could sleep if he had a mask. Otherwise he can't sleep. He defers taking any type of sleep aid. He would prefer to do a HST. Message will be routed to Dr Tresa Endo for review.

## 2018-04-24 ENCOUNTER — Encounter: Payer: Self-pay | Admitting: Vascular Surgery

## 2018-04-24 ENCOUNTER — Ambulatory Visit (HOSPITAL_COMMUNITY)
Admission: RE | Admit: 2018-04-24 | Discharge: 2018-04-24 | Disposition: A | Discharge status: Home / Self Care | Payer: Medicare Other | Source: Ambulatory Visit | Attending: Vascular Surgery | Admitting: Vascular Surgery

## 2018-04-24 ENCOUNTER — Other Ambulatory Visit: Payer: Self-pay

## 2018-04-24 ENCOUNTER — Ambulatory Visit: Payer: Medicare Other | Admitting: Vascular Surgery

## 2018-04-24 VITALS — BP 145/87 | HR 84 | Resp 20 | Ht 72.0 in | Wt 310.0 lb

## 2018-04-24 DIAGNOSIS — I872 Venous insufficiency (chronic) (peripheral): Secondary | ICD-10-CM | POA: Insufficient documentation

## 2018-04-24 DIAGNOSIS — I83891 Varicose veins of right lower extremities with other complications: Secondary | ICD-10-CM

## 2018-04-24 NOTE — Progress Notes (Signed)
Subjective:     Patient ID: Joel Rogers, male   DOB: 29-Jul-1951, 67 y.o.   MRN: [id number]  HPI This 67 year old male returns for 3 month follow-up regarding his venous stasis ulcer in the right leg. The ulcer has completely healed and he is no longer going to the wound center. He does have chronic edema and thickening as well as darkening of the skin in the right leg below the knee. He does wear elastic compression stockings.  Past Medical History:  Diagnosis Date  . Arthritis   . Atrial fibrillation (HCC)   . Diabetes mellitus without complication (HCC)    borderline  . Hyperlipidemia   . Hypertension   . Myocardial infarction (HCC)   . Normal coronary arteries    by cardiac catheterization performed 03/14/06  . Obesity   . Sleep apnea    on CPAP  . Venous insufficiency     Social History   Tobacco Use  . Smoking status: Never Smoker  . Smokeless tobacco: Never Used  Substance Use Topics  . Alcohol use: No    Family History  Problem Relation Age of Onset  . Hypertension Father   . Cancer Father        prob prostate  . Alzheimer's disease Mother   . Alzheimer's disease Maternal Grandfather   . Breast cancer Sister   . Cancer Brother        "brain tumor"    No Known Allergies   Current Outpatient Medications:  .  aspirin EC 81 MG tablet, Take 1 tablet (81 mg total) by mouth daily., Disp: 100 tablet, Rfl: 3 .  atorvastatin (LIPITOR) 80 MG tablet, TAKE ONE TABLET BY MOUTH ONCE DAILY WITH BREAKFAST, Disp: 90 tablet, Rfl: 1 .  Calcium Carbonate-Vitamin D (CALCIUM + D PO), Take 1 tablet by mouth daily., Disp: , Rfl:  .  colchicine 0.6 MG tablet, Take 0.6 mg by mouth daily as needed (gout flare). , Disp: , Rfl:  .  diltiazem (CARDIZEM CD) 120 MG 24 hr capsule, Take 1 capsule (120 mg total) by mouth daily., Disp: 90 capsule, Rfl: 01 .  ibuprofen (ADVIL,MOTRIN) 800 MG tablet, TAKE 1 TABLET BY MOUTH EVERY 8 HOURS AS NEEDED FOR MODERATE PAIN, Disp: 90 tablet, Rfl: 1 .   metoprolol (LOPRESSOR) 100 MG tablet, Take 1 tablet (100 mg total) by mouth 2 (two) times daily., Disp: 180 tablet, Rfl: 3 .  torsemide (DEMADEX) 20 MG tablet, Take 1 tablet (20 mg total) by mouth daily. If needed may take two, Disp: 220 tablet, Rfl: 1 .  Vitamin D, Ergocalciferol, (DRISDOL) 50000 units CAPS capsule, TAKE ONE CAPSULE BY MOUTH EVERY 7 DAYS, Disp: 30 capsule, Rfl: 1  Vitals:   04/24/18 1315 04/24/18 1317  BP: (!) 149/86 (!) 145/87  Pulse: 84   Resp: 20   SpO2: 95%   Weight: (!) 310 lb (140.6 kg)   Height: 6' (1.829 m)     Body mass index is 42.04 kg/m.         Review of Systems Patient is morbidly obese. Denies chest pain, dyspnea on exertion, hemoptysis    Objective:   Physical Exam BP (!) 145/87 Comment: recheck  Pulse 84   Resp 20   Ht 6' (1.829 m)   Wt (!) 310 lb (140.6 kg)   SpO2 95%   BMI 42.04 kg/m   General morbidly obese male no apparent distress alert and oriented 3 Lungs no rhonchi or wheezing Evidence of healed ulcer in  the lower third right pretibial region. 1+ chronic edema with hyperpigmentation lower half right leg.  Today I ordered a venous duplex exam the right leg which I reviewed and interpreted and also performed a bedside SonoSite ultrasound exam independently The right great saphenous vein has a few areas of reflux but it is not significantly enlarged throughout. There is reflux in the deep system throughout     Assessment:     Healed venous stasis ulcer right lower leg with chronic edema and thickening of the skin-due to deep vein reflux right leg with no significant reflux or enlargement of right great saphenous system    Plan:     Elevate foot of bed 2-3 inches Apply short leg elastic compression stocking first thing  in the morning prior to ambulation Return on a when necessary basis

## 2018-04-28 ENCOUNTER — Ambulatory Visit: Payer: Medicare Other | Admitting: Cardiovascular Disease

## 2018-04-28 ENCOUNTER — Other Ambulatory Visit: Payer: Self-pay | Admitting: Cardiovascular Disease

## 2018-04-28 ENCOUNTER — Encounter: Payer: Self-pay | Admitting: Cardiovascular Disease

## 2018-04-28 VITALS — BP 140/80 | HR 84 | Ht 72.0 in | Wt 297.0 lb

## 2018-04-28 DIAGNOSIS — I482 Chronic atrial fibrillation, unspecified: Secondary | ICD-10-CM

## 2018-04-28 DIAGNOSIS — I10 Essential (primary) hypertension: Secondary | ICD-10-CM

## 2018-04-28 DIAGNOSIS — E7800 Pure hypercholesterolemia, unspecified: Secondary | ICD-10-CM

## 2018-04-28 DIAGNOSIS — G4733 Obstructive sleep apnea (adult) (pediatric): Secondary | ICD-10-CM

## 2018-04-28 DIAGNOSIS — I350 Nonrheumatic aortic (valve) stenosis: Secondary | ICD-10-CM | POA: Diagnosis not present

## 2018-04-28 NOTE — Assessment & Plan Note (Signed)
History of chronic A. fib not on oral anticoagulation because of prior subdural hematoma.

## 2018-04-28 NOTE — Assessment & Plan Note (Signed)
History of hyperlipidemia on statin therapy. 

## 2018-04-28 NOTE — Assessment & Plan Note (Signed)
History of essential hypertension her blood pressure measured at 140/80.  He is on metoprolol and diltiazem.  Continue current meds at current dosing.

## 2018-04-28 NOTE — Patient Instructions (Signed)
Medication Instructions: Your physician recommends that you continue on your current medications as directed. Please refer to the Current Medication list given to you today.   Testing/Procedures: Your physician has requested that you have an echocardiogram in 6 months. Echocardiography is a painless test that uses sound waves to create images of your heart. It provides your doctor with information about the size and shape of your heart and how well your heart's chambers and valves are working. This procedure takes approximately one hour. There are no restrictions for this procedure.   Follow-Up: Your physician wants you to follow-up in: 6 months with Dr. Allyson SabalBerry. You will receive a reminder letter in the mail two months in advance. If you don't receive a letter, please call our office to schedule the follow-up appointment.  If you need a refill on your cardiac medications before your next appointment, please call your pharmacy.

## 2018-04-28 NOTE — Progress Notes (Signed)
04/28/2018 Joel Leachalph L Grisso   [birth year]  [id number]  Primary Physician Dorena Bodoixon, [name], PA-C Primary Cardiologist: Runell GessJonathan J Dierdre Mccalip MD FACP, LucasFACC, [place]HA, [place]  HPI:  Joel Rogers is a 67 y.o.  severely overweight, married, African American male, father of 2, grandfather to 4 grandchildren who I last 02/15/2018.Marland Kitchen. He has a history of chronic atrial fibrillation, rate controlled on Coumadin anticoagulation. He has obstructive sleep apnea on CPAP, hypertension, and hyperlipidemia. He was catheterized by Dr. Lavonne ChickBill Gamble in 2007 and found to have normal coronary arteries and normal coronary function. He had laparoscopic Roux-en-Y gastric bypass surgery in November of 2009 [facility] and has lost over 140 pounds. He feels clinically improved. His last lipid profile performed a year ago was excellent with an LDL of 82, HDL of 52, total cholesterol 142. He complains of right greater than left lower extremity swelling, which has been chronic. He has some venous reflux in his lesser saphenous vein, probably not amenable to endovenous ablation. Since I saw him 9 months ago he's remained stable. He recently had an episode of syncope in the morning while getting out of bed without other symptoms. He was taken to [facility] room where his workup was unrevealing. He did apparently hit his head and soft with developed a subdural hematoma followed by Dr. Venetia MaxonStern. This has improved over time.Oral anticoagulationwas discontinued as a result of this.Since I saw him in in August of last year he has remained stable. He does have severe aortic stenosis and just remained stable table by 2-D echo performed 02/09/18 with an aortic valve area of 0.88 cm and a peak gradient is 76 millimeters of mercury.. He does have severe pulmonary hypertension with a pulmonary artery pressure of 92 mmHg. Since I saw him back 3 months ago he has remained symptomatically stable denying increasing shortness of breath or chest pain.   I am going to check a 2D echo on him every 6 months if at some point in the near future he requires aortic valve replacement he may be a candidate for TAVR at which point I will refer him to Dr. Excell Seltzerooper.     Current Meds  Medication Sig  . aspirin EC 81 MG tablet Take 1 tablet (81 mg total) by mouth daily.  Marland Kitchen. atorvastatin (LIPITOR) 80 MG tablet TAKE ONE TABLET BY MOUTH ONCE DAILY WITH BREAKFAST  . Calcium Carbonate-Vitamin D (CALCIUM + D PO) Take 1 tablet by mouth daily.  . colchicine 0.6 MG tablet Take 0.6 mg by mouth daily as needed (gout flare).   Marland Kitchen. diltiazem (CARDIZEM CD) 120 MG 24 hr capsule Take 1 capsule (120 mg total) by mouth daily.  Marland Kitchen. ibuprofen (ADVIL,MOTRIN) 800 MG tablet TAKE 1 TABLET BY MOUTH EVERY 8 HOURS AS NEEDED FOR MODERATE PAIN  . metoprolol (LOPRESSOR) 100 MG tablet Take 1 tablet (100 mg total) by mouth 2 (two) times daily.  Marland Kitchen. torsemide (DEMADEX) 20 MG tablet Take 1 tablet (20 mg total) by mouth daily. If needed may take two  . Vitamin D, Ergocalciferol, (DRISDOL) 50000 units CAPS capsule TAKE ONE CAPSULE BY MOUTH EVERY 7 DAYS     No Known Allergies  Social History   Socioeconomic History  . Marital status: Married    Spouse name: Adela LankJacqueline  . Number of children: 2  . Years of education: 7416  . Highest education level: Not on file  Occupational History  . Occupation: Sports administratormaint mechanic    Comment: disability retirement  Social Needs  . Financial resource strain: Not on file  . Food insecurity:    Worry: Not on file    Inability: Not on file  . Transportation needs:    Medical: Not on file    Non-medical: Not on file  Tobacco Use  . Smoking status: Never Smoker  . Smokeless tobacco: Never Used  Substance and Sexual Activity  . Alcohol use: No  . Drug use: No  . Sexual activity: Not Currently  Lifestyle  . Physical activity:    Days per week: Not on file    Minutes per session: Not on file  . Stress: Not on file  Relationships  . Social connections:      Talks on phone: Not on file    Gets together: Not on file    Attends religious service: Not on file    Active member of club or organization: Not on file    Attends meetings of clubs or organizations: Not on file    Relationship status: Not on file  . Intimate partner violence:    Fear of current or ex partner: Not on file    Emotionally abused: Not on file    Physically abused: Not on file    Forced sexual activity: Not on file  Other Topics Concern  . Not on file  Social History Narrative   Doctorate in ministry   Retired Data processing manager   Disability from hip replacement   Lives with wife [name]   Exercises at the [employer] - water exercise     Review of Systems: General: negative for chills, fever, night sweats or weight changes.  Cardiovascular: negative for chest pain, dyspnea on exertion, edema, orthopnea, palpitations, paroxysmal nocturnal dyspnea or shortness of breath Dermatological: negative for rash Respiratory: negative for cough or wheezing Urologic: negative for hematuria Abdominal: negative for nausea, vomiting, diarrhea, bright red blood per rectum, melena, or hematemesis Neurologic: negative for visual changes, syncope, or dizziness All other systems reviewed and are otherwise negative except as noted above.    Blood pressure 140/80, pulse 84, height 6' (1.829 m), weight 297 lb (134.7 kg).  General appearance: alert and no distress Neck: no adenopathy, no JVD, supple, symmetrical, trachea midline, thyroid not enlarged, symmetric, no tenderness/mass/nodules and Bilateral carotid bruits versus transmitted murmur Lungs: clear to auscultation bilaterally Heart: irregularly irregular rhythm and 3/6 systolic ejection murmur at the base consistent with aortic stenosis. Extremities: 2+ pitting edema.  He is wearing compression stockings. Pulses: 2+ and symmetric Skin: Skin color, texture, turgor normal. No rashes or lesions Neurologic: Alert and oriented X 3,  normal strength and tone. Normal symmetric reflexes. Normal coordination and gait  EKG not performed today.  ASSESSMENT AND PLAN:   Obstructive sleep apnea History of obstructive sleep apnea currently being evaluated for CPAP.  Atrial fibrillation History of chronic A. fib not on oral anticoagulation because of prior subdural hematoma.  HTN (hypertension) History of essential hypertension her blood pressure measured at 140/80.  He is on metoprolol and diltiazem.  Continue current meds at current dosing.  Hyperlipemia History of hyperlipidemia on statin therapy.  Severe aortic stenosis History of severe aortic stenosis with recent 2D echo performed 02/09/2018 revealing normal LV systolic function, mild LVH, severe aortic stenosis with a valve area 0.88 cm, peak gradient of 76 mill meters of mercury and a PA pressure of 92 mmHg.  He really has not changed symptomatically over the last year.  He is on a diuretic.  Should he require  aortic valve replacement he would most likely be a candidate for TAVR.  I will will refer him to Dr. Excell Seltzer should his symptoms worsen.      Runell Gess MD FACP,FACC,FAHA, [facility] 04/28/2018 11:15 AM

## 2018-04-28 NOTE — Assessment & Plan Note (Signed)
History of obstructive sleep apnea currently being evaluated for CPAP.

## 2018-04-28 NOTE — Assessment & Plan Note (Signed)
History of severe aortic stenosis with recent 2D echo performed 02/09/2018 revealing normal LV systolic function, mild LVH, severe aortic stenosis with a valve area 0.88 cm, peak gradient of 76 mill meters of mercury and a PA pressure of 92 mmHg.  He really has not changed symptomatically over the last year.  He is on a diuretic.  Should he require aortic valve replacement he would most likely be a candidate for TAVR.  I will will refer him to Dr. Excell Seltzerooper should his symptoms worsen.

## 2018-05-01 ENCOUNTER — Other Ambulatory Visit: Payer: Self-pay

## 2018-05-01 ENCOUNTER — Ambulatory Visit (INDEPENDENT_AMBULATORY_CARE_PROVIDER_SITE_OTHER): Payer: Medicare Other | Admitting: Physician Assistant

## 2018-05-01 ENCOUNTER — Encounter: Payer: Self-pay | Admitting: Physician Assistant

## 2018-05-01 VITALS — BP 160/92 | HR 84 | Temp 97.7°F | Resp 16 | Ht 72.0 in | Wt 297.6 lb

## 2018-05-01 DIAGNOSIS — Z0000 Encounter for general adult medical examination without abnormal findings: Secondary | ICD-10-CM | POA: Diagnosis not present

## 2018-05-01 DIAGNOSIS — S065X9A Traumatic subdural hemorrhage with loss of consciousness of unspecified duration, initial encounter: Secondary | ICD-10-CM

## 2018-05-01 DIAGNOSIS — R6889 Other general symptoms and signs: Secondary | ICD-10-CM | POA: Diagnosis not present

## 2018-05-01 DIAGNOSIS — I350 Nonrheumatic aortic (valve) stenosis: Secondary | ICD-10-CM | POA: Diagnosis not present

## 2018-05-01 DIAGNOSIS — Z125 Encounter for screening for malignant neoplasm of prostate: Secondary | ICD-10-CM | POA: Diagnosis not present

## 2018-05-01 DIAGNOSIS — G4733 Obstructive sleep apnea (adult) (pediatric): Secondary | ICD-10-CM | POA: Diagnosis not present

## 2018-05-01 DIAGNOSIS — Z8042 Family history of malignant neoplasm of prostate: Secondary | ICD-10-CM | POA: Diagnosis not present

## 2018-05-01 DIAGNOSIS — Z9884 Bariatric surgery status: Secondary | ICD-10-CM | POA: Diagnosis not present

## 2018-05-01 DIAGNOSIS — S065XAA Traumatic subdural hemorrhage with loss of consciousness status unknown, initial encounter: Secondary | ICD-10-CM

## 2018-05-01 DIAGNOSIS — E785 Hyperlipidemia, unspecified: Secondary | ICD-10-CM

## 2018-05-01 DIAGNOSIS — D649 Anemia, unspecified: Secondary | ICD-10-CM | POA: Diagnosis not present

## 2018-05-01 DIAGNOSIS — I4891 Unspecified atrial fibrillation: Secondary | ICD-10-CM

## 2018-05-01 DIAGNOSIS — I83891 Varicose veins of right lower extremities with other complications: Secondary | ICD-10-CM | POA: Diagnosis not present

## 2018-05-01 DIAGNOSIS — I10 Essential (primary) hypertension: Secondary | ICD-10-CM

## 2018-05-01 DIAGNOSIS — I272 Pulmonary hypertension, unspecified: Secondary | ICD-10-CM | POA: Diagnosis not present

## 2018-05-01 LAB — CBC WITH DIFFERENTIAL/PLATELET
BASOS PCT: 0.7 %
Basophils Absolute: 58 cells/uL (ref 0–200)
EOS PCT: 1.6 %
Eosinophils Absolute: 133 cells/uL (ref 15–500)
HCT: 35.7 % — ABNORMAL LOW (ref 38.5–50.0)
Hemoglobin: 11.4 g/dL — ABNORMAL LOW (ref 13.2–17.1)
Lymphs Abs: 1370 cells/uL (ref 850–3900)
MCH: 23.7 pg — ABNORMAL LOW (ref 27.0–33.0)
MCHC: 31.9 g/dL — ABNORMAL LOW (ref 32.0–36.0)
MCV: 74.2 fL — AB (ref 80.0–100.0)
MPV: 10.1 fL (ref 7.5–12.5)
Monocytes Relative: 14 %
NEUTROS PCT: 67.2 %
Neutro Abs: 5578 cells/uL (ref 1500–7800)
PLATELETS: 248 10*3/uL (ref 140–400)
RBC: 4.81 10*6/uL (ref 4.20–5.80)
RDW: 17.8 % — AB (ref 11.0–15.0)
TOTAL LYMPHOCYTE: 16.5 %
WBC: 8.3 10*3/uL (ref 3.8–10.8)
WBCMIX: 1162 {cells}/uL — AB (ref 200–950)

## 2018-05-01 LAB — COMPLETE METABOLIC PANEL WITH GFR
AG RATIO: 1.2 (calc) (ref 1.0–2.5)
ALT: 12 U/L (ref 9–46)
AST: 23 U/L (ref 10–35)
Albumin: 3.9 g/dL (ref 3.6–5.1)
Alkaline phosphatase (APISO): 122 U/L — ABNORMAL HIGH (ref 40–115)
BUN: 22 mg/dL (ref 7–25)
CALCIUM: 9.2 mg/dL (ref 8.6–10.3)
CO2: 29 mmol/L (ref 20–32)
CREATININE: 1.04 mg/dL (ref 0.70–1.25)
Chloride: 103 mmol/L (ref 98–110)
GFR, EST AFRICAN AMERICAN: 86 mL/min/{1.73_m2} (ref >=60)
GFR, EST NON AFRICAN AMERICAN: 74 mL/min/{1.73_m2} (ref >=60)
GLUCOSE: 98 mg/dL (ref 65–99)
Globulin: 3.2 g/dL (calc) (ref 1.9–3.7)
Potassium: 3.5 mmol/L (ref 3.5–5.3)
Sodium: 143 mmol/L (ref 135–146)
TOTAL PROTEIN: 7.1 g/dL (ref 6.1–8.1)
Total Bilirubin: 2.2 mg/dL — ABNORMAL HIGH (ref 0.2–1.2)

## 2018-05-01 LAB — LIPID PANEL
Cholesterol: 122 mg/dL (ref <200)
HDL: 40 mg/dL — ABNORMAL LOW (ref >40)
LDL CHOLESTEROL (CALC): 70 mg/dL
NON-HDL CHOLESTEROL (CALC): 82 mg/dL (ref <130)
TRIGLYCERIDES: 41 mg/dL (ref <150)
Total CHOL/HDL Ratio: 3.1 (calc) (ref <5.0)

## 2018-05-01 LAB — VITAMIN B12: VITAMIN B 12: 716 pg/mL (ref 200–1100)

## 2018-05-01 LAB — PSA: PSA: 0.9 ng/mL (ref <=4.0)

## 2018-05-01 LAB — IRON: Iron: 48 ug/dL — ABNORMAL LOW (ref 50–180)

## 2018-05-01 NOTE — Progress Notes (Signed)
Patient ID: Joel Rogers MRN: [id number], DOB: 29-Nov-1950, 67 y.o. Date of Encounter: @DATE @  Chief Complaint:  Chief Complaint  Patient presents with  . New Patient (Initial Visit)    HPI: 67 y.o. year old male  presents as a New Patient to Establish Care.  Today I reviewed his office note from [facility] from 03/13/2018 by Dr. Delton SeeNelson.  That note reported that she would be leaving the practice and that he would be establishing with a new PCP.  He presents today to establish new PCP.  Today I have also reviewed his notes by Dr. Allyson SabalBerry and Dr. Hart RochesterLawson.  Also have reviewed that Dr. Tresa EndoKelly manages his sleep apnea. Today Joel Rogers reports that he sees no other specialists on a routine basis, has seen no other specialists in the past year or so.  The following information is obtained from Dr. Hazle CocaBerry's office note from 04/28/2018,  just several days ago. He has a history of chronic atrial fibrillation which has been rate controlled and had been on Coumadin anticoagulation---- until recently (see below).   Has obstructive sleep apnea on CPAP, hypertension, hyperlipidemia. Had cardiac catheterization by Dr. Elsie LincolnGamble in 2007 and was found to have normal coronary arteries and normal LV function. Had a laparoscopic Roux and Y gastric bypass surgery November 2009 at [facility] and had lost over 140 pounds and clinically felt improved. Reviewed that his lipid profile last year was excellent with LDL 82. Also reviewed that he has chronic right greater than left lower extremity swelling.  Some venous reflux. At this recent visit Dr. Allyson SabalBerry reviewed that Joel Rogers had recently had an episode of syncope in the morning while getting out of bed without other symptoms.  Was taken [facility] room where his work-up was unrevealing.  However at the time of that episode he did hit his head and developed subdural hematoma followed by Dr. Venetia MaxonStern.  Has had improved over time.   Anticoagulation was  discontinued. He does have severe aortic stenosis which is remains stable by 2D echo performed 02/09/2018.   Does have severe pulmonary hypertension.   Recently Dr. Allyson SabalBerry has been seeing him every 3 months and he has remained symptomatically stable and has been denying any increased shortness of breath or chest pain.   Therefore at his recent visit with Dr. Allyson SabalBerry he was planning to obtain an echo every 6 months.   Was felt that if in the future if he does require aortic valve replacement he may be a candidate for TAVR at which point he would refer him to Dr. Excell Seltzerooper.  Today I also reviewed his recent office note with Dr. Hart RochesterLawson from 05/04/2018.  At that visit he was there for 6864-month follow-up regarding venous stasis ulcer of the right leg.   At that visit the ulcer had completely healed and he was no longer going to the wound center.   Was noted that he does have chronic edema and thickening as well as darkening of the skin in the right leg below the knee.   Does wear elastics compression stockings.   Was to continue to elevate the foot of the bed 2 to 3 inches.  Continue to apply a short leg elastic compression stocking first thing in the morning prior to ambulation.  Is to follow-up with him on a PRN basis.  Today Joel Rogers reports that he thinks he needs a new CPAP machine.  States that his current one is about 10915  years old.  Says that he noted that his blood pressure was reading high today but he thinks it was because he got very little sleep last night.  Is that he tossed and turned all night.  States that he has been in touch with Dr. Bernadette Hoit regarding this issue and they are working on getting an updated sleep study and updated equipment.  Reports that currently he is taking the Demadex 2 in the morning 1 in the evening. He is wearing his compression stockings today.  He is fasting today. He has no other specific concerns to address today.    Past Medical History:    Diagnosis Date  . Arthritis   . Atrial fibrillation (HCC)   . Diabetes mellitus without complication (HCC)    borderline  . Hyperlipidemia   . Hypertension   . Myocardial infarction (HCC)   . Normal coronary arteries    by cardiac catheterization performed 03/14/06  . Obesity   . Sleep apnea    on CPAP  . Venous insufficiency      Home Meds: Outpatient Medications Prior to Visit  Medication Sig Dispense Refill  . aspirin EC 81 MG tablet Take 1 tablet (81 mg total) by mouth daily. 100 tablet 3  . atorvastatin (LIPITOR) 80 MG tablet TAKE ONE TABLET BY MOUTH ONCE DAILY WITH BREAKFAST 90 tablet 1  . Calcium Carbonate-Vitamin D (CALCIUM + D PO) Take 1 tablet by mouth daily.    . colchicine 0.6 MG tablet Take 0.6 mg by mouth daily as needed (gout flare).     Marland Kitchen diltiazem (CARDIZEM CD) 120 MG 24 hr capsule Take 1 capsule (120 mg total) by mouth daily. 90 capsule 01  . ibuprofen (ADVIL,MOTRIN) 800 MG tablet TAKE 1 TABLET BY MOUTH EVERY 8 HOURS AS NEEDED FOR MODERATE PAIN 90 tablet 1  . metoprolol (LOPRESSOR) 100 MG tablet Take 1 tablet (100 mg total) by mouth 2 (two) times daily. 180 tablet 3  . torsemide (DEMADEX) 20 MG tablet Take 1 tablet (20 mg total) by mouth daily. If needed may take two (Patient taking differently: Take 20 mg by mouth daily. Patient taking 2 in the am and 1 in the pm) 220 tablet 1  . Vitamin D, Ergocalciferol, (DRISDOL) 50000 units CAPS capsule TAKE ONE CAPSULE BY MOUTH EVERY 7 DAYS 30 capsule 1   No facility-administered medications prior to visit.     Allergies: No Known Allergies  Social History   Socioeconomic History  . Marital status: Married    Spouse name: [name]  . Number of children: 2  . Years of education: 81  . Highest education level: Not on file  Occupational History  . Occupation: Sports administrator    Comment: disability retirement  Social Needs  . Financial resource strain: Not on file  . Food insecurity:    Worry: Not on file     Inability: Not on file  . Transportation needs:    Medical: Not on file    Non-medical: Not on file  Tobacco Use  . Smoking status: Never Smoker  . Smokeless tobacco: Never Used  Substance and Sexual Activity  . Alcohol use: No  . Drug use: No  . Sexual activity: Not Currently  Lifestyle  . Physical activity:    Days per week: Not on file    Minutes per session: Not on file  . Stress: Not on file  Relationships  . Social connections:    Talks on phone: Not on file  Gets together: Not on file    Attends religious service: Not on file    Active member of club or organization: Not on file    Attends meetings of clubs or organizations: Not on file    Relationship status: Not on file  . Intimate partner violence:    Fear of current or ex partner: Not on file    Emotionally abused: Not on file    Physically abused: Not on file    Forced sexual activity: Not on file  Other Topics Concern  . Not on file  Social History Narrative   Doctorate in ministry   Retired Data processing manager   Disability from hip replacement   Lives with wife [name]   Exercises at the [employer] - water exercise    Family History  Problem Relation Age of Onset  . Hypertension Father   . Cancer Father        prob prostate  . Alzheimer's disease Mother   . Alzheimer's disease Maternal Grandfather   . Breast cancer Sister   . Cancer Brother        "brain tumor"     Review of Systems:  See HPI for pertinent ROS. All other ROS negative.    Physical Exam: Blood pressure (!) 160/92, pulse 84, temperature 97.7 F (36.5 C), temperature source Oral, resp. rate 16, height 6' (1.829 m), weight 135 kg (297 lb 9.6 oz), SpO2 96 %., Body mass index is 40.36 kg/m. General: Obese AAM. Appears in no acute distress. Neck: Supple. No thyromegaly. No lymphadenopathy.  Murmur radiates up the neck. Lungs: Clear bilaterally to auscultation without wheezes, rales, or rhonchi. Breathing is unlabored. Heart: Irregular  rhythm. III/VI murmur Abdomen: Soft, non-tender, non-distended with normoactive bowel sounds. No hepatomegaly. No rebound/guarding. No obvious abdominal masses. Musculoskeletal:  Strength and tone normal for age. Extremities/Skin: He is wearing compression stockings today.  I did not remove these today.  He has recently seen cardiology and also vascular and has had these evaluated and patient states that his swelling is stable/controlled with using the stockings and Demadex.  Also reports that he has no wounds present at this time. Neuro: Alert and oriented X 3. Moves all extremities spontaneously. Gait is normal. CNII-XII grossly in tact. Psych:  Responds to questions appropriately with a normal affect.     ASSESSMENT AND PLAN:  67 y.o. year old male with   1. Encounter for medical examination to establish care   2. Atrial fibrillation, unspecified type [facility]) Managed by Dr. Allyson Sabal, Cardiology.  This is stable/controlled on current medication.  Continue current medication.  3. Essential hypertension Blood pressure is elevated today but patient states that he got very little sleep last night and attributes his blood pressure to this.  BP was controlled at his recent visit with Dr. Allyson Sabal and vascular so we will continue current meds at this time without change.  He is due for follow-up labs so will check labs to monitor. - COMPLETE METABOLIC PANEL WITH GFR  4. Severe aortic stenosis This is managed by Dr. Allyson Sabal, Cardiology.  He is ordering routine echoes and seeing patient on routine basis for routine office visits. - COMPLETE METABOLIC PANEL WITH GFR  5. Varicose veins of right lower extremity with complications Has been treated by wound center and evaluated by vascular and at this point is healed/controlled and will follow up with vascular specialist PRN. - COMPLETE METABOLIC PANEL WITH GFR  6. Pulmonary hypertension, unspecified (HCC) Is managed by Dr. Allyson Sabal, Cardiology.  Currently  stable. - COMPLETE METABOLIC PANEL WITH GFR  7. Obstructive sleep apnea Is managed by Dr. Tresa Endo, Cardiology.  Patient is in the process of following up with Dr. Landry Dyke staff to update sleep study and CPAP equipment.  8. Subdural hematoma (HCC) Was managed by Dr. Venetia Maxon.  Anticoagulation has been stopped secondary to this. Dr. Allyson Sabal aware, he is managing A-Fib--now off anticoagulant since subdural hematoma  9. Hyperlipidemia, unspecified hyperlipidemia type Is on statin.  He is fasting so will check labs to follow this up. - COMPLETE METABOLIC PANEL WITH GFR - Lipid panel  10. Status post gastric bypass for obesity - Iron - Vitamin B12 - CBC with Differential/Platelet  11. Family history of prostate cancer in father - PSA  29. Anemia, unspecified type - Iron - Vitamin B12 - CBC with Differential/Platelet   I will check labs today. Will plan for him to wait 6 months for routine follow-up visit with me.   He can always follow-up here sooner if needed.   Otherwise he will continue follow-up with Dr. Allyson Sabal and will continue to follow-up with Dr. Tresa Endo regarding his sleep apnea/CPAP.  [address] [place], [place], [facility] 05/01/2018 9:33 AM

## 2018-05-02 ENCOUNTER — Telehealth: Payer: Self-pay

## 2018-05-02 DIAGNOSIS — Z1211 Encounter for screening for malignant neoplasm of colon: Secondary | ICD-10-CM

## 2018-05-02 NOTE — Telephone Encounter (Signed)
-----   Message from Dorena BodoMary B Dixon, PA-C sent at 05/02/2018  7:18 AM EDT ----- He was seen for OV yesterday as a New Pt/ Establish.  I just reviewed chart and he had Medicare Physical with prior PCP 03/23/2017--That states that he had colonoscopy 11/03/2018--??.But, under Procedure tac, I see no colonoscopy.  Find out from pt which doctor/ which GI office did his colonoscopy and verify that he did have colonoscopy. I need this information to know how to proceed regarding his iron deficiency anemia.  Find out and let me know.

## 2018-05-02 NOTE — Telephone Encounter (Addendum)
Call placed to patient and he said the GI doctor has retired Dr. Cheryle Horsfallemaria and Dr, [place] took over.Patient states when he had bis gastirc bypass surgery he was told he could not  a colonoscopy and that hey just kept check on his lab work

## 2018-05-08 ENCOUNTER — Telehealth: Payer: Self-pay | Admitting: Physician Assistant

## 2018-05-08 MED ORDER — TORSEMIDE 20 MG PO TABS: 20.0000 mg | ORAL_TABLET | Freq: Every day | ORAL | 1 refills | Status: DC

## 2018-05-08 NOTE — Telephone Encounter (Signed)
Rx sent to pharmacy   

## 2018-05-08 NOTE — Telephone Encounter (Signed)
Pt needs refill on torsemide to [facility] [place].

## 2018-05-11 ENCOUNTER — Telehealth: Payer: Self-pay | Admitting: *Deleted

## 2018-05-11 NOTE — Telephone Encounter (Signed)
Patient notified of HST appointment scheduled 05/16/18 @ 7:30 [facility].

## 2018-05-16 ENCOUNTER — Ambulatory Visit: Payer: Medicare Other | Attending: Cardiovascular Disease | Admitting: Cardiovascular Disease

## 2018-05-16 ENCOUNTER — Other Ambulatory Visit: Payer: Medicare Other

## 2018-05-16 ENCOUNTER — Telehealth: Payer: Self-pay

## 2018-05-16 DIAGNOSIS — I482 Chronic atrial fibrillation, unspecified: Secondary | ICD-10-CM

## 2018-05-16 DIAGNOSIS — Z7982 Long term (current) use of aspirin: Secondary | ICD-10-CM | POA: Diagnosis not present

## 2018-05-16 DIAGNOSIS — G4733 Obstructive sleep apnea (adult) (pediatric): Secondary | ICD-10-CM | POA: Insufficient documentation

## 2018-05-16 DIAGNOSIS — Z79899 Other long term (current) drug therapy: Secondary | ICD-10-CM | POA: Insufficient documentation

## 2018-05-16 DIAGNOSIS — I10 Essential (primary) hypertension: Secondary | ICD-10-CM | POA: Diagnosis not present

## 2018-05-16 DIAGNOSIS — G478 Other sleep disorders: Secondary | ICD-10-CM

## 2018-05-16 DIAGNOSIS — Z1211 Encounter for screening for malignant neoplasm of colon: Secondary | ICD-10-CM | POA: Diagnosis not present

## 2018-05-16 NOTE — Telephone Encounter (Signed)
[facility] that patient stated he is to take up to 3 tablets daily of torsemide. Original rx states patient is to take up to 2 if needed. Pls advise if this needs to be changed

## 2018-05-17 LAB — FECAL GLOBIN BY IMMUNOCHEMISTRY
FECAL GLOBIN RESULT:: NOT DETECTED
MICRO NUMBER:: 90757468
SPECIMEN QUALITY:: ADEQUATE

## 2018-05-17 MED ORDER — TORSEMIDE 20 MG PO TABS: ORAL_TABLET | ORAL | 0 refills | Status: DC

## 2018-05-17 NOTE — Telephone Encounter (Signed)
I have only seen him once for one office visit to establish care.  I have reviewed that note.  At that visit I did not change dose of this medication. Also reviewed his last note with Dr. Allyson SabalBerry cardiology 04/28/2018.  I do not see that he changed the dose of this medicine either. Also reviewed note by Dr. Hart RochesterLawson from 04/24/2018.  I do not see that he changed dose of medicine. I also reviewed labs that I performed.  When I reviewed those I did not adjust dose of this medicine. Also last time that cardiology had done labs they did not adjust dose of this medicine. --- Bottom line--- recommend that he continue his prior dosing of this medicine.  Document his current dosing and update that order on the medication.   Send in a new order with that dosing instruction so that things will be accurate on our computer list and at the pharmacy.

## 2018-05-17 NOTE — Telephone Encounter (Signed)
Call placed to patient he states he talked with Dr.Nelson about the torsemide and stated that if he take 2 in the morning and 1 in the evening he has no problems with fluid. Patient states Dr.Nelson said it was fine for him to do that, but never changed the rx and patient was not getting enough rx dispensed. Will make change and send over new rx patient is aware

## 2018-05-18 ENCOUNTER — Other Ambulatory Visit: Payer: Self-pay

## 2018-05-18 MED ORDER — FERROUS SULFATE 325 (65 FE) MG PO TABS: 325.0000 mg | ORAL_TABLET | Freq: Three times a day (TID) | ORAL | 11 refills | Status: DC

## 2018-05-21 ENCOUNTER — Encounter: Payer: Self-pay | Admitting: Cardiovascular Disease

## 2018-05-21 NOTE — Procedures (Signed)
[facility]        Patient Name: Joel Rogers, Matthe Study Date: 05/16/2018 Gender: Male D.O.B: 1951-05-28 Age (years): [age] Referring Provider: Nicki Guadalajarahomas Ninoshka Wainwright MD, ABSM Height (inches): 72 Interpreting Physician: Nicki Guadalajarahomas Linna Thebeau MD, ABSM Weight (lbs): 297 RPSGT: Peak, Robert BMI: 40 MRN: [id number] Neck Size: 17.00  CLINICAL INFORMATION Sleep Study Type: HST  Indication for sleep study: Hypertension, OSA  Epworth Sleepiness Score: 10  Most recent polysomnogram dated 03/21/2018 revealed an AHI of 0.0/h and RDI of 0.0/h; however, this was a suboptimal study and the patient only slept for 1 minute out of 371 minutes for 0.3% of the night.  The study was terminated due to lack of sleep. The patient has a prior history of sleep apnea and had been on CPAP therapy.  He has lost approximately 140 pounds since undergoing gastric bypass surgery in 2009.  SLEEP STUDY TECHNIQUE A multi-channel overnight portable sleep study was performed. The channels recorded were: nasal airflow, thoracic respiratory movement, and oxygen saturation with a pulse oximetry. Snoring was also monitored.  MEDICATIONS     aspirin EC 81 MG tablet         atorvastatin (LIPITOR) 80 MG tablet         Calcium Carbonate-Vitamin D (CALCIUM + D PO)         colchicine 0.6 MG tablet         diltiazem (CARDIZEM CD) 120 MG 24 hr capsule         ferrous sulfate 325 (65 FE) MG tablet         ibuprofen (ADVIL,MOTRIN) 800 MG tablet         metoprolol (LOPRESSOR) 100 MG tablet         torsemide (DEMADEX) 20 MG tablet         Vitamin D, Ergocalciferol, (DRISDOL) 50000 units CAPS capsule      Patient self administered medications include: N/A.  SLEEP ARCHITECTURE Patient was studied for 245 minutes. The sleep efficiency was 51.1 % and the patient was supine for 84.2%. The arousal index was 0.0 per hour.  RESPIRATORY PARAMETERS The overall AHI was 2.0 per hour, with a central apnea index of 0.0 per hour.  The oxygen  nadir was 90% during sleep.  CARDIAC DATA Mean heart rate during sleep was 79.9 bpm.  IMPRESSIONS - No significant obstructive sleep apnea occurred during this study (AHI 2.0/h).   AHI with supine sleep 2.0/h.  REM sleep AHI cannot be determined since this was a home sleep study - No significant central sleep apnea occurred during this study (CAI = 0.0/h). - The patient had minimal or no oxygen desaturation during the study (Min O2 90%) - Patient snored 20.2% during the sleep.   Total duration of snoring was 49.5 minutes.  DIAGNOSIS - Mild increase upper airway resistance  RECOMMENDATIONS - Mild increased upper airway resistance with associated snoring without definitive sleep apnea. Suspect the patient's CPAP has significantly improved as result of significant weight loss.  At present, there is no indication for CPAP therapy. - Effort should be made to optimize nasal and oropharyngeal patency. - Avoid alcohol, sedatives and other CNS depressants that may worsen sleep apnea and disrupt normal sleep architecture. - Sleep hygiene should be reviewed to assess factors that may improve sleep quality. - Weight management and regular exercise should be initiated or continued.  [Electronically signed] 05/21/2018 12:45 PM  Nicki Guadalajarahomas Masiyah Jorstad MD, [facility] CenterFACC, ABSM Diplomate, American Board of Sleep Medicine   NPI: [id number]  [place] PH: [phone]   FX: [phone] [place]

## 2018-05-22 ENCOUNTER — Telehealth: Payer: Self-pay | Admitting: *Deleted

## 2018-05-22 NOTE — Telephone Encounter (Signed)
Left results on patient's answering machine. Okay per dpr.

## 2018-05-22 NOTE — Telephone Encounter (Signed)
-----   Message from Lennette Biharihomas A Kelly, MD sent at 05/21/2018 12:49 PM EDT ----- Joel Rogers .  Please notify the patient with results of the study.  No definitive sleep apnea was identified.

## 2018-05-29 ENCOUNTER — Other Ambulatory Visit: Payer: Self-pay | Admitting: Family Medicine

## 2018-05-31 ENCOUNTER — Telehealth: Payer: Self-pay | Admitting: Physician Assistant

## 2018-05-31 MED ORDER — IBUPROFEN 800 MG PO TABS: ORAL_TABLET | ORAL | 1 refills | Status: DC

## 2018-05-31 MED ORDER — DILTIAZEM HCL ER COATED BEADS 120 MG PO CP24: 120.0000 mg | ORAL_CAPSULE | Freq: Every day | ORAL | Status: DC

## 2018-05-31 NOTE — Telephone Encounter (Signed)
Pt needs refill on ibuprofen to [facility] [place], please call patient about his bp medication he states he is using one that is not on his medication list, and that he needs a refill but doesn't know the name.

## 2018-05-31 NOTE — Telephone Encounter (Signed)
Call placed to patient medication sent to pharmacy

## 2018-06-10 ENCOUNTER — Emergency Department (HOSPITAL_COMMUNITY)
Admission: EM | Admit: 2018-06-10 | Discharge: 2018-06-11 | Disposition: A | Discharge status: Home / Self Care | Payer: Medicare Other | Attending: Emergency Medicine | Admitting: Emergency Medicine

## 2018-06-10 ENCOUNTER — Emergency Department (HOSPITAL_COMMUNITY): Payer: Medicare Other

## 2018-06-10 ENCOUNTER — Other Ambulatory Visit: Payer: Self-pay

## 2018-06-10 ENCOUNTER — Encounter (HOSPITAL_COMMUNITY): Payer: Self-pay | Admitting: Emergency Medicine

## 2018-06-10 DIAGNOSIS — R0600 Dyspnea, unspecified: Secondary | ICD-10-CM | POA: Diagnosis not present

## 2018-06-10 DIAGNOSIS — Z7982 Long term (current) use of aspirin: Secondary | ICD-10-CM | POA: Diagnosis not present

## 2018-06-10 DIAGNOSIS — R7303 Prediabetes: Secondary | ICD-10-CM | POA: Diagnosis not present

## 2018-06-10 DIAGNOSIS — Z79899 Other long term (current) drug therapy: Secondary | ICD-10-CM | POA: Insufficient documentation

## 2018-06-10 DIAGNOSIS — I350 Nonrheumatic aortic (valve) stenosis: Secondary | ICD-10-CM | POA: Diagnosis not present

## 2018-06-10 DIAGNOSIS — I10 Essential (primary) hypertension: Secondary | ICD-10-CM | POA: Diagnosis not present

## 2018-06-10 DIAGNOSIS — R0789 Other chest pain: Secondary | ICD-10-CM | POA: Diagnosis not present

## 2018-06-10 DIAGNOSIS — R0602 Shortness of breath: Secondary | ICD-10-CM | POA: Diagnosis not present

## 2018-06-10 DIAGNOSIS — R079 Chest pain, unspecified: Secondary | ICD-10-CM | POA: Diagnosis present

## 2018-06-10 LAB — I-STAT TROPONIN, ED: Troponin i, poc: 0.04 ng/mL (ref 0.00–0.08)

## 2018-06-10 LAB — CBC
HCT: 34.8 % — ABNORMAL LOW (ref 39.0–52.0)
Hemoglobin: 11.8 g/dL — ABNORMAL LOW (ref 13.0–17.0)
MCH: 24.9 pg — ABNORMAL LOW (ref 26.0–34.0)
MCHC: 33.9 g/dL (ref 30.0–36.0)
MCV: 73.4 fL — ABNORMAL LOW (ref 78.0–100.0)
Platelets: 218 10*3/uL (ref 150–400)
RBC: 4.74 MIL/uL (ref 4.22–5.81)
RDW: 19.2 % — ABNORMAL HIGH (ref 11.5–15.5)
WBC: 12.4 10*3/uL — ABNORMAL HIGH (ref 4.0–10.5)

## 2018-06-10 NOTE — ED Provider Notes (Signed)
[facility] EMERGENCY DEPARTMENT Provider Note   CSN: [id number] Arrival date & time: 06/10/18  2308  Time seen 23:33 PM    History   Chief Complaint Chief Complaint  Patient presents with  . Chest Pain    HPI Joel Rogers is a 67 y.o. male.  HPI patient has a history of chronic atrial fibrillation, severe aortic stenosis and asthma as a child relates about 8:30 PM he got short of breath and hoarse.  He thinks he may have had some wheezing or heard some rattling in his chest.  He states he is coughing up some brown sputum.  He states his chest feels tight similar to what he had when he had a MI however not as bad.  He has chronic swelling of his legs from lymphedema which is unchanged.  He does relate his abdomen is getting more swollen and tight but also states he is "locked up".  He states he drove himself to the ED and when he walked out to the car it made him more short of breath.  He also is noticing that when he is in the heat it will cut off his breath.  He denies any history of congestive heart failure.  PCP Dorena Bodo, PA-C Cardiology Dr Allyson Sabal  Past Medical History:  Diagnosis Date  . Arthritis   . Atrial fibrillation (HCC)   . Diabetes mellitus without complication (HCC)    borderline  . Hyperlipidemia   . Hypertension   . Myocardial infarction (HCC)   . Normal coronary arteries    by cardiac catheterization performed 03/14/06  . Obesity   . Sleep apnea    on CPAP  . Venous insufficiency     Patient Active Problem List   Diagnosis Date Noted  . Pulmonary hypertension, unspecified (HCC) 02/15/2018  . Varicose veins of right lower extremity with complications 01/10/2018  . Severe aortic stenosis 08/09/2017  . Vitamin D deficiency 12/02/2016  . Family history of prostate cancer in father 11/03/2016  . Status post gastric bypass for obesity 11/02/2016  . Subdural hematoma (HCC) 08/26/2016  . Syncope 07/13/2016  . HTN (hypertension) 04/13/2013  .  Hyperlipemia 04/13/2013  . Atrial fibrillation (HCC) 02/14/2013  . Acute myocardial infarction (HCC) 06/25/2008  . EXOGENOUS OBESITY 06/18/2008  . Obstructive sleep apnea 06/18/2008    Past Surgical History:  Procedure Laterality Date  . CRANIOTOMY Right 08/27/2016   Procedure: CRANIOTOMY HEMATOMA EVACUATION SUBDURAL;  Surgeon: Maeola Harman, MD;  Location: [facility] OR;  Service: Neurosurgery;  Laterality: Right;  . GASTRIC BYPASS  09/2008  . JOINT REPLACEMENT  08/31/2006   right hip  . TOTAL HIP ARTHROPLASTY     right hip        Home Medications    Prior to Admission medications   Medication Sig Start Date End Date Taking? Authorizing Provider  aspirin EC 81 MG tablet Take 1 tablet (81 mg total) by mouth daily. 12/02/16   Eustace Moore, MD  atorvastatin (LIPITOR) 80 MG tablet TAKE ONE TABLET BY MOUTH ONCE DAILY WITH BREAKFAST 05/24/17   Runell Gess, MD  Calcium Carbonate-Vitamin D (CALCIUM + D PO) Take 1 tablet by mouth daily.    [provider]  colchicine 0.6 MG tablet Take 0.6 mg by mouth daily as needed (gout flare).     [provider]  diltiazem (CARDIZEM CD) 120 MG 24 hr capsule Take 1 capsule (120 mg total) by mouth daily. 05/31/18   Dorena Bodo, PA-C  ferrous sulfate 325 (65 FE) MG tablet Take 1 tablet (325 mg total) by mouth 3 (three) times daily. 05/18/18   Allayne Butcher B, PA-C  ibuprofen (ADVIL,MOTRIN) 800 MG tablet TAKE 1 TABLET BY MOUTH EVERY 8 HOURS AS NEEDED FOR MODERATE PAIN 05/31/18   Dorena Bodo, PA-C  metoprolol (LOPRESSOR) 100 MG tablet Take 1 tablet (100 mg total) by mouth 2 (two) times daily. 12/26/15   Runell Gess, MD  torsemide (DEMADEX) 20 MG tablet Take 2 tablets (40 mg total) by mouth 2 (two) times daily. Take 2 tablets in the morning and 1 in the evening 06/11/18   Devoria Albe, MD  Vitamin D, Ergocalciferol, (DRISDOL) 50000 units CAPS capsule TAKE ONE CAPSULE BY MOUTH EVERY 7 DAYS 05/24/17   Eustace Moore, MD    Family  History Family History  Problem Relation Age of Onset  . Hypertension Father   . Cancer Father        prob prostate  . Alzheimer's disease Mother   . Alzheimer's disease Maternal Grandfather   . Breast cancer Sister   . Cancer Brother        "brain tumor"    Social History Social History   Tobacco Use  . Smoking status: Never Smoker  . Smokeless tobacco: Never Used  Substance Use Topics  . Alcohol use: No  . Drug use: No  lives at home   Allergies   Patient has no known allergies.   Review of Systems Review of Systems  All other systems reviewed and are negative.    Physical Exam Updated Vital Signs BP 127/78   Pulse 79   Resp 18   Wt 134.7 kg (297 lb)   SpO2 94% Comment: ambulating  BMI 40.28 kg/m   Vital signs normal    Physical Exam  Constitutional: He is oriented to person, place, and time. He appears well-developed and well-nourished.  Non-toxic appearance. He does not appear ill. No distress.  obese  HENT:  Head: Normocephalic and atraumatic.  Right Ear: External ear normal.  Left Ear: External ear normal.  Nose: Nose normal. No mucosal edema or rhinorrhea.  Mouth/Throat: Oropharynx is clear and moist and mucous membranes are normal. No dental abscesses or uvula swelling.  Eyes: Pupils are equal, round, and reactive to light. Conjunctivae and EOM are normal.  Neck: Normal range of motion and full passive range of motion without pain. Neck supple.  Cardiovascular: Normal rate. An irregularly irregular rhythm present. Exam reveals no gallop and no friction rub.  Murmur heard.  Systolic murmur is present with a grade of 2/6. Pulmonary/Chest: Effort normal and breath sounds normal. No respiratory distress. He has no wheezes. He has no rhonchi. He has no rales. He exhibits no tenderness and no crepitus.  Abdominal: Soft. Normal appearance and bowel sounds are normal. He exhibits distension. There is no tenderness. There is no rebound and no guarding.   Musculoskeletal: Normal range of motion. He exhibits edema. He exhibits no tenderness.  Moves all extremities well.  Patient is noted to have swelling of both lower extremities, the right is much worse in the left.  On the right side the skin feels more thickened and tight and it extends into the thigh area, on the right side he has some darkening of the skin between the knee and the ankle and has some hypopigmented areas from prior ulcerated areas that have now healed.  The left lower extremity is also swollen but not as tight and is  without skin changes.  Neurological: He is alert and oriented to person, place, and time. He has normal strength. No cranial nerve deficit.  Skin: Skin is warm, dry and intact. No rash noted. No erythema. No pallor.  Psychiatric: He has a normal mood and affect. His speech is normal and behavior is normal. His mood appears not anxious.  Nursing note and vitals reviewed.    ED Treatments / Results  Labs (all labs ordered are listed, but only abnormal results are displayed) Results for orders placed or performed during the hospital encounter of 06/10/18  Basic metabolic panel  Result Value Ref Range   Sodium 142 135 - 145 mmol/L   Potassium 3.2 (L) 3.5 - 5.1 mmol/L   Chloride 106 98 - 111 mmol/L   CO2 23 22 - 32 mmol/L   Glucose, Bld 111 (H) 70 - 99 mg/dL   BUN 28 (H) 8 - 23 mg/dL   Creatinine, Ser 2.951.82 (H) 0.61 - 1.24 mg/dL   Calcium 8.9 8.9 - 62.110.3 mg/dL   GFR calc non Af Amer 37 (L) >60 mL/min   GFR calc Af Amer 43 (L) >60 mL/min   Anion gap 13 5 - 15  CBC  Result Value Ref Range   WBC 12.4 (H) 4.0 - 10.5 K/uL   RBC 4.74 4.22 - 5.81 MIL/uL   Hemoglobin 11.8 (L) 13.0 - 17.0 g/dL   HCT 30.834.8 (L) 65.739.0 - 84.652.0 %   MCV 73.4 (L) 78.0 - 100.0 fL   MCH 24.9 (L) 26.0 - 34.0 pg   MCHC 33.9 30.0 - 36.0 g/dL   RDW 96.219.2 (H) 95.211.5 - 84.115.5 %   Platelets 218 150 - 400 K/uL  Brain natriuretic peptide  Result Value Ref Range   B Natriuretic Peptide 685.0 (H) 0.0 -  100.0 pg/mL  I-stat troponin, ED  Result Value Ref Range   Troponin i, poc 0.04 0.00 - 0.08 ng/mL   Comment 3           Laboratory interpretation all normal except leukocytosis, mild anemia, mild elevation of BNP, chronic renal insufficiency, leukocytosis, mild anemia    EKG EKG Interpretation  Date/Time:  Saturday June 10 2018 23:16:30 EDT Ventricular Rate:  101 PR Interval:    QRS Duration: 163 QT Interval:  419 QTC Calculation: 544 R Axis:   -87 Text Interpretation:  Atrial fibrillation Ventricular premature complex RBBB and LAFB Borderline ST depression, lateral leads Baseline wander in lead(s) I II aVR aVL V1 V2 V3 No significant change since last tracing 26 Aug 2016 Confirmed by Devoria AlbeKnapp, Jailyn Langhorst ([id number]) on 06/10/2018 11:30:39 PM   Radiology Dg Chest 2 View  Result Date: 06/11/2018 CLINICAL DATA:  67 year old male with shortness of breath EXAM: CHEST - 2 VIEW COMPARISON:  Chest radiograph dated 07/29/2017 FINDINGS: There is cardiomegaly with probable mild vascular congestion. No focal consolidation, pleural effusion, or pneumothorax. No acute osseous pathology. IMPRESSION: Stable cardiomegaly.  Possible minimal vascular congestion. Electronically Signed   By: Elgie CollardArash  Radparvar M.D.   On: 06/11/2018 00:10    Procedures Procedures (including critical care time)  Medications Ordered in ED Medications  furosemide (LASIX) injection 80 mg (80 mg Intravenous Given 06/11/18 0041)  potassium chloride SA (K-DUR,KLOR-CON) CR tablet 40 mEq (40 mEq Oral Given 06/11/18 0041)     Initial Impression / Assessment and Plan / ED Course  I have reviewed the triage vital signs and the nursing notes.  Pertinent labs & imaging results that were available during my care of  the patient were reviewed by me and considered in my medical decision making (see chart for details).     23:42 PM I reviewed patient's x-ray he has moderate cardiomegaly however I do not see a lot of vascular congestion, I am  going to wait for the radiology interpretation.  After reviewing the official radiology report patient was given Lasix IV.  Recheck at 1:55 AM patient states he started to have urinary output, he states when he got back from ambulating to the bathroom he did feel short of breath.  He again reiterates to me he is coughing up brown sputum and he had pneumonia recently I discussed with him he did not have a pneumonia on his x-ray tonight.  I am going to have the nursing staff ambulate him and see if he gets hypoxic.  Recheck at time of discharge she states now that he has had a lot of urinary outputs that he feels less congested in his chest, he states he feels less short of breath on ambulation.  He states he has an appointment with his primary care doctor tomorrow.  His next appointment with his cardiology is not until like October.  We discussed calling his cardiologist office tomorrow to let them know he had to come to the ED that he starting to have some symptoms of his aortic stenosis.  He was on torsemide 40 mg in the morning and 20 in the evening, it was changed to 40 mg in the morning and 40 mg in the evening.  Final Clinical Impressions(s) / ED Diagnoses   Final diagnoses:  Dyspnea, unspecified type  Aortic valve stenosis, etiology of cardiac valve disease unspecified    ED Discharge Orders        Ordered    torsemide (DEMADEX) 20 MG tablet  2 times daily     06/11/18 1610      Plan discharge  Devoria Albe, MD, Concha Pyo, MD 06/11/18 [id number]

## 2018-06-10 NOTE — ED Triage Notes (Signed)
Pt C/O chest pain and SOB that started around 2100 this evening.

## 2018-06-11 LAB — BRAIN NATRIURETIC PEPTIDE: B NATRIURETIC PEPTIDE 5: 685 pg/mL — AB (ref 0.0–100.0)

## 2018-06-11 LAB — BASIC METABOLIC PANEL
Anion gap: 13 (ref 5–15)
BUN: 28 mg/dL — ABNORMAL HIGH (ref 8–23)
CO2: 23 mmol/L (ref 22–32)
Calcium: 8.9 mg/dL (ref 8.9–10.3)
Chloride: 106 mmol/L (ref 98–111)
Creatinine, Ser: 1.82 mg/dL — ABNORMAL HIGH (ref 0.61–1.24)
GFR calc Af Amer: 43 mL/min — ABNORMAL LOW (ref >60)
GFR calc non Af Amer: 37 mL/min — ABNORMAL LOW (ref >60)
Glucose, Bld: 111 mg/dL — ABNORMAL HIGH (ref 70–99)
Potassium: 3.2 mmol/L — ABNORMAL LOW (ref 3.5–5.1)
Sodium: 142 mmol/L (ref 135–145)

## 2018-06-11 MED ORDER — POTASSIUM CHLORIDE CRYS ER 20 MEQ PO TBCR
40.0000 meq | EXTENDED_RELEASE_TABLET | Freq: Once | ORAL | Status: AC
  Administered 2018-06-11: 40 meq via ORAL
  Filled 2018-06-11: qty 2

## 2018-06-11 MED ORDER — FUROSEMIDE 10 MG/ML IJ SOLN
80.0000 mg | Freq: Once | INTRAMUSCULAR | Status: AC
  Administered 2018-06-11: 80 mg via INTRAVENOUS
  Filled 2018-06-11: qty 8

## 2018-06-11 MED ORDER — TORSEMIDE 20 MG PO TABS: 40.0000 mg | ORAL_TABLET | Freq: Two times a day (BID) | ORAL | 0 refills | Status: DC

## 2018-06-11 NOTE — Discharge Instructions (Addendum)
Increase your torsemide to 2 tablets twice a day. Call Dr Hazle CocaBerry's office tomorrow to have them let him know you had to come to the ED. He may want to see you sooner than your appointment this fall.

## 2018-06-12 ENCOUNTER — Telehealth: Payer: Self-pay | Admitting: Cardiovascular Disease

## 2018-06-12 NOTE — Telephone Encounter (Signed)
Spoke with pt who states he was seen in the ED on 7/20 with c/o SOB. He reports he was administered IV diuretics to help pull fluid off. He also reports he torsemide was changed to 40 mg BID. He states he was instructed to f/u with cardiologist as he exhibits signs of aortic stenosis. Appointment was scheduled on 7/25 with Corine ShelterLuke Kilroy, PA. Routing to Dr. Allyson SabalBerry for further recommendations.

## 2018-06-12 NOTE — Telephone Encounter (Signed)
New Message:     Pt states that he was in the hosp and was told that his valve is getting worse and a upcoming surgery needs to be moved up. Pt states he would like Berry to look at the hosp notes and get back with him. The pt has been scheduled an [facility]18 [facility]

## 2018-06-12 NOTE — Telephone Encounter (Signed)
7/25 would be fine with Corine ShelterLuke Kilroy

## 2018-06-13 ENCOUNTER — Ambulatory Visit: Payer: Medicare Other | Admitting: Family Medicine

## 2018-06-13 NOTE — Telephone Encounter (Signed)
Left message for the patient to make sure and keep his appointment Thursday this week and to call if he was having any problems.

## 2018-06-15 ENCOUNTER — Ambulatory Visit: Payer: Medicare Other | Admitting: Cardiology

## 2018-06-15 ENCOUNTER — Encounter: Payer: Self-pay | Admitting: Cardiology

## 2018-06-15 VITALS — BP 136/78 | HR 95 | Ht 72.0 in | Wt 310.6 lb

## 2018-06-15 DIAGNOSIS — I482 Chronic atrial fibrillation, unspecified: Secondary | ICD-10-CM

## 2018-06-15 DIAGNOSIS — K089 Disorder of teeth and supporting structures, unspecified: Secondary | ICD-10-CM

## 2018-06-15 DIAGNOSIS — M1990 Unspecified osteoarthritis, unspecified site: Secondary | ICD-10-CM | POA: Insufficient documentation

## 2018-06-15 DIAGNOSIS — M1611 Unilateral primary osteoarthritis, right hip: Secondary | ICD-10-CM

## 2018-06-15 DIAGNOSIS — I350 Nonrheumatic aortic (valve) stenosis: Secondary | ICD-10-CM

## 2018-06-15 DIAGNOSIS — I5033 Acute on chronic diastolic (congestive) heart failure: Secondary | ICD-10-CM | POA: Diagnosis not present

## 2018-06-15 DIAGNOSIS — I5032 Chronic diastolic (congestive) heart failure: Secondary | ICD-10-CM

## 2018-06-15 DIAGNOSIS — G4733 Obstructive sleep apnea (adult) (pediatric): Secondary | ICD-10-CM

## 2018-06-15 DIAGNOSIS — I10 Essential (primary) hypertension: Secondary | ICD-10-CM | POA: Diagnosis not present

## 2018-06-15 DIAGNOSIS — N179 Acute kidney failure, unspecified: Secondary | ICD-10-CM | POA: Insufficient documentation

## 2018-06-15 HISTORY — DX: Chronic diastolic (congestive) heart failure: I50.32

## 2018-06-15 NOTE — Assessment & Plan Note (Signed)
Taken off anticoagulation secondary to SDH after a fall Dec 2017

## 2018-06-15 NOTE — Assessment & Plan Note (Signed)
He may need dental consult if TAVR considered.

## 2018-06-15 NOTE — Assessment & Plan Note (Signed)
Recent sleep study reviewed by Dr kelly-05/16/18- OSO improved

## 2018-06-15 NOTE — Assessment & Plan Note (Signed)
Pt seen in ED 7/20 with increasing DOE. Diuretic increased

## 2018-06-15 NOTE — Progress Notes (Signed)
06/15/2018 Joel LeachRalph L Rogers   [birth year]  [id number]  Primary Physician Dorena Bodoixon, [name], PA-C Primary Cardiologist: Dr Allyson SabalBerry  HPI:  Pt is a 67 y/o obese AA male followed by Dr Allyson SabalBerry with a history of severe AS, pulmonary HTN, chronic atrial fibrillation, rate controlled on Coumadin anticoagulation, obstructive sleep apnea on CPAP, hypertension, and hyperlipidemia. He was catheterized by Dr. Lavonne ChickBill Gamble in 2007 and found to have normal coronary arteries. He had laparoscopic Roux-en-Y gastric [facility] and has lost over 140 pounds. He feels clinically improved. F/U sleep study in June 2019 confirmed improvement in sleep apnea. His last lipid profile performed a year ago was excellent with an LDL of 82, HDL of 52, total cholesterol 142. He has chronic right greater than left lower extremity swellin. He has venous reflux not amenable to endovenous ablation. He had a syncopal spell in 2017and developed a subdural hematoma. This has improved over time.Oral anticoagulationwas discontinued as a result of this. 2-D echo performed 02/09/18 showed an aortic valve area of 0.88 cm and a peak gradient is 76 millimeters of mercury.. He does have severe pulmonary hypertension with a pulmonary artery pressure of 92 mmHg.   He is in the office today after an ED visit 06/10/18 for dyspnea. The pt complains of gradually increasing DOE. His BNP was 685. His diuretics were increased. He has improved since then but feels unhappy with what he is able to do in general. He has grandchildren in college and he says he wants to be around to see them graduate. I didn't get a history of angina, mainly DOE. Dr Allyson SabalBerry has discussed the possibility of TAVR with the patient in the past and the patient would like to pursue this now if we feel it will improve his lifestyle.     Current Outpatient Medications  Medication Sig Dispense Refill  . aspirin EC 81 MG tablet Take 1 tablet (81 mg total) by mouth  daily. 100 tablet 3  . atorvastatin (LIPITOR) 80 MG tablet TAKE ONE TABLET BY MOUTH ONCE DAILY WITH BREAKFAST 90 tablet 1  . colchicine 0.6 MG tablet Take 0.6 mg by mouth daily as needed (gout flare).     Marland Kitchen. diltiazem (CARDIZEM CD) 120 MG 24 hr capsule Take 1 capsule (120 mg total) by mouth daily. 90 capsule 01  . ferrous sulfate 325 (65 FE) MG tablet Take 1 tablet (325 mg total) by mouth 3 (three) times daily. 90 tablet 11  . ibuprofen (ADVIL,MOTRIN) 800 MG tablet TAKE 1 TABLET BY MOUTH EVERY 8 HOURS AS NEEDED FOR MODERATE PAIN 90 tablet 1  . metoprolol tartrate (LOPRESSOR) 100 MG tablet Take 100 mg by mouth daily.    Marland Kitchen. torsemide (DEMADEX) 20 MG tablet Take 2 tablets (40 mg total) by mouth 2 (two) times daily. Take 2 tablets in the morning and 1 in the evening (Patient taking differently: Take 40 mg by mouth 2 (two) times daily. Take 2  20 mg tablets BID. In the morning and in the evening) 120 tablet 0  . Vitamin D, Ergocalciferol, (DRISDOL) 50000 units CAPS capsule TAKE ONE CAPSULE BY MOUTH EVERY 7 DAYS 30 capsule 1   No current facility-administered medications for this visit.     No Known Allergies  Past Medical History:  Diagnosis Date  . Acute on chronic diastolic (congestive) heart failure (HCC) 06/15/2018  . Arthritis   . Atrial fibrillation (HCC)   . Diabetes mellitus without complication (HCC)  borderline  . Hyperlipidemia   . Hypertension   . Myocardial infarction (HCC)   . Normal coronary arteries    by cardiac catheterization performed 03/14/06  . Obesity   . Sleep apnea    on CPAP  . Venous insufficiency     Social History   Socioeconomic History  . Marital status: Married    Spouse name: Joel Rogers  . Number of children: 2  . Years of education: 66  . Highest education level: Not on file  Occupational History  . Occupation: Sports administrator    Comment: disability retirement  Social Needs  . Financial resource strain: Not on file  . Food insecurity:    Worry:  Not on file    Inability: Not on file  . Transportation needs:    Medical: Not on file    Non-medical: Not on file  Tobacco Use  . Smoking status: Never Smoker  . Smokeless tobacco: Never Used  Substance and Sexual Activity  . Alcohol use: No  . Drug use: No  . Sexual activity: Not Currently  Lifestyle  . Physical activity:    Days per week: Not on file    Minutes per session: Not on file  . Stress: Not on file  Relationships  . Social connections:    Talks on phone: Not on file    Gets together: Not on file    Attends religious service: Not on file    Active member of club or organization: Not on file    Attends meetings of clubs or organizations: Not on file    Relationship status: Not on file  . Intimate partner violence:    Fear of current or ex partner: Not on file    Emotionally abused: Not on file    Physically abused: Not on file    Forced sexual activity: Not on file  Other Topics Concern  . Not on file  Social History Narrative   Doctorate in ministry   Retired Data processing manager   Disability from hip replacement   Lives with wife Joel Rogers   Exercises at the [employer] - water exercise     Family History  Problem Relation Age of Onset  . Hypertension Father   . Cancer Father        prob prostate  . Alzheimer's disease Mother   . Alzheimer's disease Maternal Grandfather   . Breast cancer Sister   . Cancer Brother        "brain tumor"     Review of Systems: General: negative for chills, fever, night sweats or weight changes.  Cardiovascular: negative for chest pain, edema, orthopnea, palpitations, paroxysmal nocturnal dyspnea  Dermatological: negative for rash Respiratory: negative for cough or wheezing Urologic: negative for hematuria Abdominal: negative for nausea, vomiting, diarrhea, bright red blood per rectum, melena, or hematemesis Neurologic: negative for visual changes, syncope, or dizziness All other systems reviewed and are otherwise negative  except as noted above.    Blood pressure 136/78, pulse 95, height 6' (1.829 m), weight (!) 310 lb 9.6 oz (140.9 kg), SpO2 97 %.  General appearance: alert, cooperative, no distress and morbidly obese Neck: no JVD and transmitted murmur to carotids Lungs: clear to auscultation bilaterally Heart: irregularly irregular with 2/6 systolic murmur AOV, LSB, diminnished S2 Extremities: trace LE edema Pulses: 2+ and symmetric Skin: Skin color, texture, turgor normal. No rashes or lesions Neurologic: Grossly normal  EKG 06/10/18 AF with VR 100, RBBB, LAFB  ASSESSMENT AND PLAN:   Acute on  chronic diastolic (congestive) heart failure (HCC) Pt seen in ED 7/20 with increasing DOE. Diuretic increased  Severe aortic stenosis Echo March 2019- severe AS, AVA 0.88, peak 76 mmHg, pulmonary HTN- PA 92 mmHg  Morbid obesity (HCC) Pt had gastric bypass- lost 140 lbs. BMI-42  Obstructive sleep apnea Recent sleep study reviewed by Dr kelly-05/16/18- OSO improved  Chronic atrial fibrillation (HCC) Taken off anticoagulation secondary to SDH after a fall Dec 2017  DJD (degenerative joint disease) Chronic pain Rt hip- s/p Rt hip replacement, he takes Motrin for this  AKI (acute kidney injury) (HCC) SCr 1.8 at 7/20 ED visit, this is new for him. Stop NSAID, re check  Poor dentition He may need dental consult if TAVR considered.    PLAN  Stop NSAIDs, try Tylenol for hip pain. Check BMP, will discuss referral for TAVR with Dr Allyson Sabal.   Corine Shelter PA-C 06/15/2018 1:12 PM

## 2018-06-15 NOTE — Assessment & Plan Note (Signed)
Pt had gastric bypass- lost 140 lbs. BMI-42

## 2018-06-15 NOTE — Patient Instructions (Signed)
Medication Instructions: Your physician recommends that you continue on your current medications as directed. Please refer to the Current Medication list given to you today.   Labwork: TODAY: BMP  Procedures/Testing: None Ordered  Follow-Up: Your physician recommends that you schedule a follow-up appointment in: 2 months with Dr.Berry    Any Additional Special Instructions Will Be Listed Below (If Applicable).     If you need a refill on your cardiac medications before your next appointment, please call your pharmacy.

## 2018-06-15 NOTE — Assessment & Plan Note (Signed)
Echo March 2019- severe AS, AVA 0.88, peak 76 mmHg, pulmonary HTN- PA 92 mmHg

## 2018-06-15 NOTE — Assessment & Plan Note (Signed)
Chronic pain Rt hip- s/p Rt hip replacement, he takes Motrin for this

## 2018-06-15 NOTE — Assessment & Plan Note (Signed)
SCr 1.8 at 7/20 ED visit, this is new for him. Stop NSAID, re check

## 2018-06-16 ENCOUNTER — Telehealth: Payer: Self-pay

## 2018-06-16 ENCOUNTER — Telehealth: Payer: Self-pay | Admitting: Internal Medicine

## 2018-06-16 LAB — BASIC METABOLIC PANEL
BUN/Creatinine Ratio: 16 (ref 10–24)
BUN: 18 mg/dL (ref 8–27)
CO2: 26 mmol/L (ref 20–29)
Calcium: 9.1 mg/dL (ref 8.6–10.2)
Chloride: 100 mmol/L (ref 96–106)
Creatinine, Ser: 1.11 mg/dL (ref 0.76–1.27)
GFR calc Af Amer: 79 mL/min/{1.73_m2} (ref >59)
GFR calc non Af Amer: 68 mL/min/{1.73_m2} (ref >59)
Glucose: 88 mg/dL (ref 65–99)
Potassium: 2.9 mmol/L — CL (ref 3.5–5.2)
Sodium: 143 mmol/L (ref 134–144)

## 2018-06-16 MED ORDER — POTASSIUM CHLORIDE CRYS ER 20 MEQ PO TBCR: EXTENDED_RELEASE_TABLET | ORAL | 0 refills | Status: DC

## 2018-06-16 MED ORDER — POTASSIUM CHLORIDE CRYS ER 20 MEQ PO TBCR: 20.0000 meq | EXTENDED_RELEASE_TABLET | Freq: Two times a day (BID) | ORAL | 6 refills | Status: DC

## 2018-06-16 NOTE — Telephone Encounter (Signed)
-----   Message from Abelino DerrickLuke K Kilroy, [place]-C sent at 06/16/2018  7:47 AM EDT ----- Please tell Mr Corine ShelterWatkins he needs to start K+ Dur 20 meq twice a day and he needs to start today- take 40 meq this morning then 20 Meq BID,  LUKE KILROY PA-C 06/16/2018 7:49 AM

## 2018-06-16 NOTE — Telephone Encounter (Signed)
-----   Message from Abelino DerrickLuke K Kilroy, [place]-C sent at 06/15/2018  4:03 PM EDT ----- T Mr Joel Rogers needs a referral to Dr Excell Seltzerooper for consideration of TAVR-can you arrange this please  Joel ShelterLUKE KILROY PA-C 06/15/2018 4:04 PM

## 2018-06-16 NOTE — Telephone Encounter (Signed)
Patient notified directly and voiced understanding.  

## 2018-06-16 NOTE — Telephone Encounter (Signed)
Cardiology Moonlighter Note  Return page from [facility].  Potassium 2.9 on recent BMP. Results forwarded to ordering team.  Rosario JacksAnthony Philip Tymber Stallings, MD Cardiology Fellow, PGY-6

## 2018-06-20 ENCOUNTER — Encounter: Payer: Self-pay | Admitting: Physician Assistant

## 2018-06-27 ENCOUNTER — Ambulatory Visit: Payer: Medicare Other | Admitting: Cardiovascular Disease

## 2018-06-27 ENCOUNTER — Other Ambulatory Visit: Payer: Self-pay

## 2018-06-27 ENCOUNTER — Encounter: Payer: Self-pay | Admitting: Cardiovascular Disease

## 2018-06-27 VITALS — BP 130/78 | HR 71 | Ht 72.0 in | Wt 291.8 lb

## 2018-06-27 DIAGNOSIS — I350 Nonrheumatic aortic (valve) stenosis: Secondary | ICD-10-CM | POA: Diagnosis not present

## 2018-06-27 DIAGNOSIS — R0609 Other forms of dyspnea: Secondary | ICD-10-CM

## 2018-06-27 LAB — BASIC METABOLIC PANEL
BUN / CREAT RATIO: 13 (ref 10–24)
BUN: 14 mg/dL (ref 8–27)
CHLORIDE: 102 mmol/L (ref 96–106)
CO2: 25 mmol/L (ref 20–29)
Calcium: 9.5 mg/dL (ref 8.6–10.2)
Creatinine, Ser: 1.06 mg/dL (ref 0.76–1.27)
GFR calc non Af Amer: 72 mL/min/{1.73_m2} (ref >59)
GFR, EST AFRICAN AMERICAN: 84 mL/min/{1.73_m2} (ref >59)
GLUCOSE: 97 mg/dL (ref 65–99)
POTASSIUM: 4 mmol/L (ref 3.5–5.2)
SODIUM: 142 mmol/L (ref 134–144)

## 2018-06-27 LAB — CBC
Hematocrit: 33.1 % — ABNORMAL LOW (ref 37.5–51.0)
Hemoglobin: 10.9 g/dL — ABNORMAL LOW (ref 13.0–17.7)
MCH: 24.1 pg — AB (ref 26.6–33.0)
MCHC: 32.9 g/dL (ref 31.5–35.7)
MCV: 73 fL — ABNORMAL LOW (ref 79–97)
PLATELETS: 260 10*3/uL (ref 150–450)
RBC: 4.52 x10E6/uL (ref 4.14–5.80)
RDW: 19.5 % — AB (ref 12.3–15.4)
WBC: 7.7 10*3/uL (ref 3.4–10.8)

## 2018-06-27 NOTE — Patient Instructions (Signed)
Medication Instructions:  Your physician recommends that you continue on your current medications as directed. Please refer to the Current Medication list given to you today.   Labwork: Lab work to be done today--BMP and CBC  Testing/Procedures: Your physician has requested that you have a cardiac catheterization. Cardiac catheterization is used to diagnose and/or treat various heart conditions. Doctors may recommend this procedure for a number of different reasons. The most common reason is to evaluate chest pain. Chest pain can be a symptom of coronary artery disease (CAD), and cardiac catheterization can show whether plaque is narrowing or blocking your heart's arteries. This procedure is also used to evaluate the valves, as well as measure the blood flow and oxygen levels in different parts of your heart. For further information please visit [email or web address]. Please follow instruction sheet, as given.  Scheduled for August 15,2019  Follow-Up: To be arranged after catheterization.   Any Other Special Instructions Will Be Listed Below (If Applicable).     [name] [address], [address] [place] [address] Dept: [phone] Loc: [phone]  Joel LeachRalph L Rogers  06/27/2018  You are scheduled for a Cardiac Catheterization on Thursday, August 15 with Dr. Verne Carrowhristopher McAlhany.  1. [facility] at [facility] [place]: [address] [place], [place] [address] at 10:00 AM (This time is two hours before your procedure to ensure your preparation). Free valet parking service is available.   Special note: Every effort is made to have your procedure done on time. Please understand that emergencies sometimes delay scheduled procedures.  2. Diet: Do not eat solid foods after midnight.  The patient may have clear liquids until 5am upon the day of the  procedure.  3. Labs: lab work was done in office on August 6,2019  4. Medication instructions in preparation for your procedure: Do not take torsemide and potassium the morning of the procedure.     On the morning of your procedure, take your Aspirin and any morning medicines NOT listed above.  You may use sips of water.  5. Plan for one night stay--bring personal belongings. 6. Bring a current list of your medications and current insurance cards. 7. You MUST have a responsible person to drive you home. 8. Someone MUST be with you the first 24 hours after you arrive home or your discharge will be delayed. 9. Please wear clothes that are easy to get on and off and wear slip-on shoes.  Thank you for allowing us to care for you!   -- [facility]    If you need a refill on your cardiac medications before your next appointment, please call your pharmacy.

## 2018-06-27 NOTE — H&P (View-Only) (Signed)
 Valve Clinic Consult Note  Chief Complaint  Patient presents with  . New Patient (Initial Visit)    severe aortic stenosis   History of Present Illness: 67 yo male with history of morbid obesity, pulmonary HTN, chronic atrial fibrillation, sleep apnea, HTN, hyperlipidemia, chronic venous insufficiency, chronic diastolic CHF and severe aortic stenosis who is referred to the valve clinic today by Dr. Berry for further evaluation of his aortic stenosis and discussion regarding possible TAVR. He underwent a gastric bypass surgery in 2009. He has chronic atrial fibrillation. He had been on coumadin but suffered a subdural hematoma in 2017 after having a syncopal event and coumadin was stopped. He has been followed for aortic stenosis for the past several years. Echo in March 2019 with normal LV systolic function, LVEF=60-65% with mild LVH. There was grade 3 diastolic dysfunction. The aortic valve leaflets are thickened and calcified with limited mobility. Mean gradient  40 mmHg, peak gradient 76 mmHg, AVA 0.88cm2, DVI 0.28.   He tells me that he has been having more dyspnea with exertion. He has increased lower extremity edema. No recent dizziness or syncope. He denies chest pain. He sees a dentist once per year but it has been over a year. He has no tooth pain or active issues. He lives with his wife in [place].   Primary Care Physician: Dixon, [name], PA-C Primary Cardiologist: J Berry Referring Cardiologist: J Berry  Past Medical History:  Diagnosis Date  . Arthritis   . Atrial fibrillation, chronic (HCC)   . Chronic diastolic CHF (congestive heart failure) (HCC) 06/15/2018  . Diabetes mellitus without complication (HCC)    borderline  . Hyperlipidemia   . Hypertension   . Morbid obesity (HCC)   . Normal coronary arteries    by cardiac catheterization performed 03/14/06  . Obesity   . Sleep apnea    on CPAP  . Venous insufficiency     Past Surgical History:  Procedure Laterality  Date  . CRANIOTOMY Right 08/27/2016   Procedure: CRANIOTOMY HEMATOMA EVACUATION SUBDURAL;  Surgeon: Joseph Stern, MD;  Location: [facility] OR;  Service: Neurosurgery;  Laterality: Right;  . GASTRIC BYPASS  09/2008  . JOINT REPLACEMENT  08/31/2006   right hip  . TOTAL HIP ARTHROPLASTY     right hip    Current Outpatient Medications  Medication Sig Dispense Refill  . aspirin EC 81 MG tablet Take 1 tablet (81 mg total) by mouth daily. 100 tablet 3  . atorvastatin (LIPITOR) 80 MG tablet TAKE ONE TABLET BY MOUTH ONCE DAILY WITH BREAKFAST 90 tablet 1  . colchicine 0.6 MG tablet Take 0.6 mg by mouth daily as needed (gout flare).     . diltiazem (CARDIZEM CD) 120 MG 24 hr capsule Take 1 capsule (120 mg total) by mouth daily. 90 capsule 01  . ferrous sulfate 325 (65 FE) MG tablet Take 1 tablet (325 mg total) by mouth 3 (three) times daily. 90 tablet 11  . ibuprofen (ADVIL,MOTRIN) 800 MG tablet TAKE 1 TABLET BY MOUTH EVERY 8 HOURS AS NEEDED FOR MODERATE PAIN 90 tablet 1  . metoprolol tartrate (LOPRESSOR) 100 MG tablet Take 100 mg by mouth daily.    . potassium chloride SA (K-DUR,KLOR-CON) 20 MEQ tablet Take 1 tablet (20 mEq total) by mouth 2 (two) times daily. 60 tablet 6  . torsemide (DEMADEX) 20 MG tablet Take 40 mg by mouth 2 (two) times daily. 40 mg in the morning and 40 mg in the evening    .   Vitamin D, Ergocalciferol, (DRISDOL) 50000 units CAPS capsule TAKE ONE CAPSULE BY MOUTH EVERY 7 DAYS 30 capsule 1   No current facility-administered medications for this visit.     No Known Allergies  Social History   Socioeconomic History  . Marital status: Married    Spouse name: [name]  . Number of children: 2  . Years of education: 16  . Highest education level: Not on file  Occupational History  . 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 Financial resource strain: Not on file  . Food insecurity:    Worry: Not on file    Inability: Not  on file  . Transportation needs:    Medical: Not on file    Non-medical: Not on file  Tobacco Use  . Smoking status: Never Smoker  . Smokeless tobacco: Never Used  Substance and Sexual Activity  . Alcohol use: No  . Drug use: No  . Sexual activity: Not Currently  Lifestyle  . Physical activity:    Days per week: Not on file    Minutes per session: Not on file  . Stress: Not on file  Relationships  . Social connections:    Talks on phone: Not on file    Gets together: Not on file    Attends religious service: Not on file    Active member of club or organization: Not on file    Attends meetings of clubs or organizations: Not on file    Relationship status: Not on file  . Intimate partner violence:    Fear of current or ex partner: Not on file    Emotionally abused: Not on file    Physically abused: Not on file    Forced sexual activity: Not on file  Other Topics Concern  . Not on file  Social History Narrative   Doctorate in ministry   Retired maintenance mechanic   Disability from hip replacement   Lives with wife [name]   [facility] [employer] [facility]    Family History  Problem Relation Age of Onset  . Hypertension Father   . Cancer Father        prob prostate  . Alzheimer's disease Mother   . Alzheimer's disease Maternal Grandfather   . Breast cancer Sister   . Cancer Brother        "brain tumor"    Review of Systems:  As stated in the HPI and otherwise negative.   BP 130/78 (BP Location: Right Arm, Patient Position: Sitting, Cuff Size: Large)   Pulse 71   Ht 6' (1.829 m)   Wt 291 lb 12.8 oz (132.4 kg)   SpO2 95%   BMI 39.58 kg/m   Physical Examination: General: Well developed, well nourished, NAD  HEENT: OP clear, mucus membranes moist  SKIN: warm, dry. No rashes. Neuro: No focal deficits  Musculoskeletal: Muscle strength 5/5 all ext  Psychiatric: Mood and affect normal  Neck: No JVD, no carotid bruits, no thyromegaly, no  lymphadenopathy.  Lungs:Clear bilaterally, no wheezes, rhonci, crackles Cardiovascular: Regular rate and rhythm. Loud, harsh, late peaking systolic murmur.  Abdomen:Soft. Bowel sounds present. Non-tender.  Extremities: No lower extremity edema. Pulses are 2 + in the bilateral DP/PT.  Echo March 2019: Left ventricle: The cavity size was normal. Wall thickness was   increased in a pattern of mild LVH. Systolic function was normal.   The estimated ejection fraction was in the range of   60% to 65%.   Wall motion was normal; there were no regional wall motion   abnormalities. Doppler parameters are consistent with a   reversible restrictive pattern, indicative of decreased left   ventricular diastolic compliance and/or increased left atrial   pressure (grade 3 diastolic dysfunction). - Ventricular septum: The contour showed diastolic flattening. - Aortic valve: Valve mobility was restricted. There was severe   stenosis. There was mild regurgitation. Peak velocity (S): 437   cm/s. Mean gradient (S): 40 mm Hg. Peak gradient (S): 76 mm Hg. - Mitral valve: Calcified annulus. Mildly thickened, moderately   calcified leaflets . - Left atrium: The atrium was severely dilated. - Right ventricle: The cavity size was severely dilated. Wall   thickness was normal. - Right atrium: The atrium was severely dilated. - Tricuspid valve: There was moderate regurgitation. - Pulmonary arteries: Systolic pressure was severely increased. PA   peak pressure: 92 mm Hg (S).  Recommendations:  Consider further evaluation of aortic stenosis.   ------------------------------------------------------------------- Left ventricle:  The cavity size was normal. Wall thickness was increased in a pattern of mild LVH. Systolic function was normal. The estimated ejection fraction was in the range of 60% to 65%. Wall motion was normal; there were no regional wall motion abnormalities. Doppler parameters are consistent with  a reversible restrictive pattern, indicative of decreased left ventricular diastolic compliance and/or increased left atrial pressure (grade 3 diastolic dysfunction).  ------------------------------------------------------------------- Aortic valve:   Trileaflet; severely thickened, severely calcified leaflets. Valve mobility was restricted.  Doppler:   There was severe stenosis.   There was mild regurgitation.    VTI ratio of LVOT to aortic valve: 0.28. Valve area (VTI): 0.87 cm^2. Indexed valve area (VTI): 0.31 cm^2/m^2. Peak velocity ratio of LVOT to aortic valve: 0.28. Valve area (Vmax): 0.88 cm^2. Indexed valve area (Vmax): 0.32 cm^2/m^2. Mean velocity ratio of LVOT to aortic valve: 0.3. Valve area (Vmean): 0.94 cm^2. Indexed valve area (Vmean): 0.34 cm^2/m^2.    Mean gradient (S): 40 mm Hg. Peak gradient (S): 76 mm Hg.  ------------------------------------------------------------------- Aorta:  Aortic root: The aortic root was normal in size.  ------------------------------------------------------------------- Mitral valve:   Calcified annulus. Mildly thickened, moderately calcified leaflets . Mobility was not restricted.  Doppler: Transvalvular velocity was within the normal range. There was no evidence for stenosis. There was no regurgitation.    Valve area by pressure half-time: 3.86 cm^2. Indexed valve area by pressure half-time: 1.39 cm^2/m^2. Valve area by continuity equation (using LVOT flow): 2.36 cm^2. Indexed valve area by continuity equation (using LVOT flow): 0.85 cm^2/m^2.    Mean gradient (D): 3 mm Hg. Peak gradient (D): 12 mm Hg.  ------------------------------------------------------------------- Left atrium:  The atrium was severely dilated.  ------------------------------------------------------------------- Right ventricle:  The cavity size was severely dilated. Wall thickness was normal. Systolic function was  normal.  ------------------------------------------------------------------- Ventricular septum:   The contour showed diastolic flattening.  ------------------------------------------------------------------- Pulmonic valve:   Poorly visualized.  Structurally normal valve. Cusp separation was normal.  Doppler:  Transvalvular velocity was within the normal range. There was no evidence for stenosis. There was no regurgitation.  ------------------------------------------------------------------- Tricuspid valve:   Structurally normal valve.    Doppler: Transvalvular velocity was within the normal range. There was moderate regurgitation.  ------------------------------------------------------------------- Pulmonary artery:   The main pulmonary artery was normal-sized. Systolic pressure was severely increased.  ------------------------------------------------------------------- Right atrium:  The atrium was severely dilated.  ------------------------------------------------------------------- Pericardium:  There was no pericardial effusion.  ------------------------------------------------------------------- Systemic veins: Inferior vena cava: The   vessel was dilated. The respirophasic diameter changes were blunted (< 50%), consistent with elevated central venous pressure.  ------------------------------------------------------------------- Measurements   Left ventricle                            Value          Reference  LV ID, ED, PLAX chordal                   44    mm       43 - 52  LV ID, ES, PLAX chordal                   24    mm       23 - 38  LV fx shortening, PLAX chordal            45    %        >=29  LV PW thickness, ED                       14    mm       ---------  IVS/LV PW ratio, ED                       1              <=1.3  Stroke volume, 2D                         80    ml       ---------  Stroke volume/bsa, 2D                     29    ml/m^2    ---------  LV e&', lateral                            5.91  cm/s     ---------  LV E/e&', lateral                          28.76          ---------  LV e&', medial                             4.81  cm/s     ---------  LV E/e&', medial                           35.34          ---------  LV e&', average                            5.36  cm/s     ---------  LV E/e&', average                          31.72          ---------    Ventricular septum                        Value          Reference    IVS thickness, ED                         14    mm       ---------    LVOT                                      Value          Reference  LVOT ID, S                                20    mm       ---------  LVOT area                                 3.14  cm^2     ---------  LVOT peak velocity, S                     122   cm/s     ---------  LVOT mean velocity, S                     79.6  cm/s     ---------  LVOT VTI, S                               25.6  cm       ---------  LVOT peak gradient, S                     6     mm Hg    ---------    Aortic valve                              Value          Reference  Aortic valve peak velocity, S             437   cm/s     ---------  Aortic valve mean velocity, S             267   cm/s     ---------  Aortic valve VTI, S                       92.2  cm       ---------  Aortic mean gradient, S                   40    mm Hg    ---------  Aortic peak gradient, S                   76    mm Hg    ---------  VTI ratio, LVOT/AV                        0.28           ---------  Aortic valve area, VTI                    0.87  cm^2     ---------    Aortic valve area/bsa, VTI                0.31  cm^2/m^2 ---------  Velocity ratio, peak, LVOT/AV             0.28           ---------  Aortic valve area, peak velocity          0.88  cm^2     ---------  Aortic valve area/bsa, peak               0.32  cm^2/m^2 ---------  velocity  Velocity ratio, mean, LVOT/AV             0.3             ---------  Aortic valve area, mean velocity          0.94  cm^2     ---------  Aortic valve area/bsa, mean               0.34  cm^2/m^2 ---------  velocity  Aortic regurg pressure half-time          402   ms       ---------    Aorta                                     Value          Reference  Aortic root ID, ED                        31    mm       ---------  Ascending aorta ID, A-P, S                31    mm       ---------    Left atrium                               Value          Reference  LA ID, A-P, ES                            78    mm       ---------  LA ID/bsa, A-P                    (H)     2.81  cm/m^2   <=2.2  LA volume, S                              274   ml       ---------  LA volume/bsa, S                          98.8  ml/m^2   ---------  LA volume, ES, 1-p A4C                    266   ml       ---------  LA volume/bsa, ES, 1-p A4C                95.9  ml/m^2   ---------  LA volume, ES, 1-p A2C                      282   ml       ---------  LA volume/bsa, ES, 1-p A2C                101.7 ml/m^2   ---------    Mitral valve                              Value          Reference  Mitral E-wave peak velocity               170   cm/s     ---------  Mitral A-wave peak velocity               46.1  cm/s     ---------  Mitral mean velocity, D                   75    cm/s     ---------  Mitral deceleration time                  169   ms       150 - 230  Mitral pressure half-time                 81    ms       ---------  Mitral mean gradient, D                   3     mm Hg    ---------  Mitral peak gradient, D                   12    mm Hg    ---------  Mitral E/A ratio, peak                    3.6            ---------  Mitral valve area, PHT, DP                3.86  cm^2     ---------  Mitral valve area/bsa, PHT, DP            1.39  cm^2/m^2 ---------  Mitral valve area, LVOT                   2.36  cm^2     ---------  continuity  Mitral valve area/bsa, LVOT                0.85  cm^2/m^2 ---------  continuity  Mitral annulus VTI, D                     34.1  cm       ---------    Pulmonary arteries                        Value          Reference  PA pressure, S, DP                (H)     92    mm Hg    <=30    Tricuspid valve                           Value          Reference    Tricuspid regurg peak velocity            440   cm/s     ---------  Tricuspid peak RV-RA gradient             77    mm Hg    ---------    Systemic veins                            Value          Reference  Estimated CVP                             15    mm Hg    ---------    Right ventricle                           Value          Reference  TAPSE                                     16.3  mm       ---------  RV pressure, S, DP                (H)     92    mm Hg    <=30  RV s&', lateral, S                         11.1  cm/s     ---------  EKG:  EKG from 06/10/18 is reviewed by me and shows atrial fib with RBBB, PVC.    Recent Labs: 05/01/2018: ALT 12 06/10/2018: B Natriuretic Peptide 685.0; Hemoglobin 11.8; Platelets 218 06/15/2018: BUN 18; Creatinine, Ser 1.11; Potassium 2.9; Sodium 143     Wt Readings from Last 3 Encounters:  06/27/18 291 lb 12.8 oz (132.4 kg)  06/15/18 (!) 310 lb 9.6 oz (140.9 kg)  06/10/18 297 lb (134.7 kg)     Other studies Reviewed: Additional studies/ records that were reviewed today include: echo images, office notes, EKG Review of the above records demonstrates: severe AS   Assessment and Plan:   1. Severe aortic valve stenosis: He has severe, stage D aortic valve stenosis. I have personally reviewed the echo images. The aortic valve is thickened, calcified with limited leaflet mobility. I think he would benefit from AVR. Given morbid obesity and poor mobility, he is not a good candidate for conventional AVR by surgical approach. I think he may be a good candidate for TAVR.   STS Risk Score:  Procedure: Isolated AVR  Risk of Mortality: 2.879%   Renal Failure: 7.782%  Permanent Stroke: 1.672%  Prolonged Ventilation: 15.154%  DSW Infection: 0.232%  Reoperation: 3.703%  Morbidity or Mortality: 21.037%  Short Length of Stay: 21.449%  Long Length of Stay: 11.964%   I have reviewed the natural history of aortic stenosis with the patient and their family members  who are present today. We have discussed the limitations of medical therapy and the poor prognosis associated with symptomatic aortic stenosis. We have reviewed potential treatment options, including palliative medical therapy, conventional surgical aortic valve replacement, and transcatheter aortic valve replacement. We discussed treatment options in the context of the patient's specific comorbid medical conditions.     He would like to proceed with planning for TAVR. I will arrange a right and left heart catheterization at [facility] 07/06/18. Risks and benefits of procedure reviewed with the patient. After the cath, he will have a cardiac CT, CTA of the chest/abdomen and pelvis, PFTs, carotid dopplers and a PT assessment and will then be referred to see one of the CT surgeons on our TAVR team.    Current medicines are reviewed at length with the patient today.  The patient does not have concerns regarding medicines.  The following changes have been made:  no change  Labs/ tests ordered today include:   Orders Placed This Encounter  Procedures  . CBC  . Basic Metabolic Panel (BMET)   Disposition:   FU with the valve team.    Signed, Zeola Brys, MD 06/27/2018 10:42 AM    [facility] [address], [place], [place]  [address] Phone: [phone]; Fax: [phone]   

## 2018-06-27 NOTE — Progress Notes (Signed)
Orders placed for TAVR testing.

## 2018-06-27 NOTE — Progress Notes (Signed)
Valve Clinic Consult Note  Chief Complaint  Patient presents with  . New Patient (Initial Visit)    severe aortic stenosis   History of Present Illness: 67 yo male with history of morbid obesity, pulmonary HTN, chronic atrial fibrillation, sleep apnea, HTN, hyperlipidemia, chronic venous insufficiency, chronic diastolic CHF and severe aortic stenosis who is referred to the valve clinic today by Dr. Allyson Sabal for further evaluation of his aortic stenosis and discussion regarding possible TAVR. He underwent a gastric bypass surgery in 2009. He has chronic atrial fibrillation. He had been on coumadin but suffered a subdural hematoma in 2017 after having a syncopal event and coumadin was stopped. He has been followed for aortic stenosis for the past several years. Echo in March 2019 with normal LV systolic function, LVEF=60-65% with mild LVH. There was grade 3 diastolic dysfunction. The aortic valve leaflets are thickened and calcified with limited mobility. Mean gradient  40 mmHg, peak gradient 76 mmHg, AVA 0.88cm2, DVI 0.28.   He tells me that he has been having more dyspnea with exertion. He has increased lower extremity edema. No recent dizziness or syncope. He denies chest pain. He sees a dentist once per year but it has been over a year. He has no tooth pain or active issues. He lives with his wife in [place].   Primary Care Physician: Deon Pilling Primary Cardiologist: Erlene Quan Referring Cardiologist: Erlene Quan  Past Medical History:  Diagnosis Date  . Arthritis   . Atrial fibrillation, chronic (HCC)   . Chronic diastolic CHF (congestive heart failure) (HCC) 06/15/2018  . Diabetes mellitus without complication (HCC)    borderline  . Hyperlipidemia   . Hypertension   . Morbid obesity (HCC)   . Normal coronary arteries    by cardiac catheterization performed 03/14/06  . Obesity   . Sleep apnea    on CPAP  . Venous insufficiency     Past Surgical History:  Procedure Laterality  Date  . CRANIOTOMY Right 08/27/2016   Procedure: CRANIOTOMY HEMATOMA EVACUATION SUBDURAL;  Surgeon: Maeola Harman, MD;  Location: [facility] OR;  Service: Neurosurgery;  Laterality: Right;  . GASTRIC BYPASS  09/2008  . JOINT REPLACEMENT  08/31/2006   right hip  . TOTAL HIP ARTHROPLASTY     right hip    Current Outpatient Medications  Medication Sig Dispense Refill  . aspirin EC 81 MG tablet Take 1 tablet (81 mg total) by mouth daily. 100 tablet 3  . atorvastatin (LIPITOR) 80 MG tablet TAKE ONE TABLET BY MOUTH ONCE DAILY WITH BREAKFAST 90 tablet 1  . colchicine 0.6 MG tablet Take 0.6 mg by mouth daily as needed (gout flare).     Marland Kitchen diltiazem (CARDIZEM CD) 120 MG 24 hr capsule Take 1 capsule (120 mg total) by mouth daily. 90 capsule 01  . ferrous sulfate 325 (65 FE) MG tablet Take 1 tablet (325 mg total) by mouth 3 (three) times daily. 90 tablet 11  . ibuprofen (ADVIL,MOTRIN) 800 MG tablet TAKE 1 TABLET BY MOUTH EVERY 8 HOURS AS NEEDED FOR MODERATE PAIN 90 tablet 1  . metoprolol tartrate (LOPRESSOR) 100 MG tablet Take 100 mg by mouth daily.    . potassium chloride SA (K-DUR,KLOR-CON) 20 MEQ tablet Take 1 tablet (20 mEq total) by mouth 2 (two) times daily. 60 tablet 6  . torsemide (DEMADEX) 20 MG tablet Take 40 mg by mouth 2 (two) times daily. 40 mg in the morning and 40 mg in the evening    .  Vitamin D, Ergocalciferol, (DRISDOL) 50000 units CAPS capsule TAKE ONE CAPSULE BY MOUTH EVERY 7 DAYS 30 capsule 1   No current facility-administered medications for this visit.     No Known Allergies  Social History   Socioeconomic History  . Marital status: Married    Spouse name: [name]  . Number of children: 2  . Years of education: 55  . Highest education level: Not on file  Occupational History  . 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 Financial resource strain: Not on file  . Food insecurity:    Worry: Not on file    Inability: Not  on file  . Transportation needs:    Medical: Not on file    Non-medical: Not on file  Tobacco Use  . Smoking status: Never Smoker  . Smokeless tobacco: Never Used  Substance and Sexual Activity  . Alcohol use: No  . Drug use: No  . Sexual activity: Not Currently  Lifestyle  . Physical activity:    Days per week: Not on file    Minutes per session: Not on file  . Stress: Not on file  Relationships  . Social connections:    Talks on phone: Not on file    Gets together: Not on file    Attends religious service: Not on file    Active member of club or organization: Not on file    Attends meetings of clubs or organizations: Not on file    Relationship status: Not on file  . Intimate partner violence:    Fear of current or ex partner: Not on file    Emotionally abused: Not on file    Physically abused: Not on file    Forced sexual activity: Not on file  Other Topics Concern  . Not on file  Social History Narrative   Doctorate in ministry   Retired Data processing manager   Disability from hip replacement   Lives with wife [name]   Exercises at the [employer] - water exercise    Family History  Problem Relation Age of Onset  . Hypertension Father   . Cancer Father        prob prostate  . Alzheimer's disease Mother   . Alzheimer's disease Maternal Grandfather   . Breast cancer Sister   . Cancer Brother        "brain tumor"    Review of Systems:  As stated in the HPI and otherwise negative.   BP 130/78 (BP Location: Right Arm, Patient Position: Sitting, Cuff Size: Large)   Pulse 71   Ht 6' (1.829 m)   Wt 291 lb 12.8 oz (132.4 kg)   SpO2 95%   BMI 39.58 kg/m   Physical Examination: General: Well developed, well nourished, NAD  HEENT: OP clear, mucus membranes moist  SKIN: warm, dry. No rashes. Neuro: No focal deficits  Musculoskeletal: Muscle strength 5/5 all ext  Psychiatric: Mood and affect normal  Neck: No JVD, no carotid bruits, no thyromegaly, no  lymphadenopathy.  Lungs:Clear bilaterally, no wheezes, rhonci, crackles Cardiovascular: Regular rate and rhythm. Loud, harsh, late peaking systolic murmur.  Abdomen:Soft. Bowel sounds present. Non-tender.  Extremities: No lower extremity edema. Pulses are 2 + in the bilateral DP/PT.  Echo March 2019: Left ventricle: The cavity size was normal. Wall thickness was   increased in a pattern of mild LVH. Systolic function was normal.   The estimated ejection fraction was in the range of  60% to 65%.   Wall motion was normal; there were no regional wall motion   abnormalities. Doppler parameters are consistent with a   reversible restrictive pattern, indicative of decreased left   ventricular diastolic compliance and/or increased left atrial   pressure (grade 3 diastolic dysfunction). - Ventricular septum: The contour showed diastolic flattening. - Aortic valve: Valve mobility was restricted. There was severe   stenosis. There was mild regurgitation. Peak velocity (S): 437   cm/s. Mean gradient (S): 40 mm Hg. Peak gradient (S): 76 mm Hg. - Mitral valve: Calcified annulus. Mildly thickened, moderately   calcified leaflets . - Left atrium: The atrium was severely dilated. - Right ventricle: The cavity size was severely dilated. Wall   thickness was normal. - Right atrium: The atrium was severely dilated. - Tricuspid valve: There was moderate regurgitation. - Pulmonary arteries: Systolic pressure was severely increased. PA   peak pressure: 92 mm Hg (S).  Recommendations:  Consider further evaluation of aortic stenosis.   ------------------------------------------------------------------- Left ventricle:  The cavity size was normal. Wall thickness was increased in a pattern of mild LVH. Systolic function was normal. The estimated ejection fraction was in the range of 60% to 65%. Wall motion was normal; there were no regional wall motion abnormalities. Doppler parameters are consistent with  a reversible restrictive pattern, indicative of decreased left ventricular diastolic compliance and/or increased left atrial pressure (grade 3 diastolic dysfunction).  ------------------------------------------------------------------- Aortic valve:   Trileaflet; severely thickened, severely calcified leaflets. Valve mobility was restricted.  Doppler:   There was severe stenosis.   There was mild regurgitation.    VTI ratio of LVOT to aortic valve: 0.28. Valve area (VTI): 0.87 cm^2. Indexed valve area (VTI): 0.31 cm^2/m^2. Peak velocity ratio of LVOT to aortic valve: 0.28. Valve area (Vmax): 0.88 cm^2. Indexed valve area (Vmax): 0.32 cm^2/m^2. Mean velocity ratio of LVOT to aortic valve: 0.3. Valve area (Vmean): 0.94 cm^2. Indexed valve area (Vmean): 0.34 cm^2/m^2.    Mean gradient (S): 40 mm Hg. Peak gradient (S): 76 mm Hg.  ------------------------------------------------------------------- Aorta:  Aortic root: The aortic root was normal in size.  ------------------------------------------------------------------- Mitral valve:   Calcified annulus. Mildly thickened, moderately calcified leaflets . Mobility was not restricted.  Doppler: Transvalvular velocity was within the normal range. There was no evidence for stenosis. There was no regurgitation.    Valve area by pressure half-time: 3.86 cm^2. Indexed valve area by pressure half-time: 1.39 cm^2/m^2. Valve area by continuity equation (using LVOT flow): 2.36 cm^2. Indexed valve area by continuity equation (using LVOT flow): 0.85 cm^2/m^2.    Mean gradient (D): 3 mm Hg. Peak gradient (D): 12 mm Hg.  ------------------------------------------------------------------- Left atrium:  The atrium was severely dilated.  ------------------------------------------------------------------- Right ventricle:  The cavity size was severely dilated. Wall thickness was normal. Systolic function was  normal.  ------------------------------------------------------------------- Ventricular septum:   The contour showed diastolic flattening.  ------------------------------------------------------------------- Pulmonic valve:   Poorly visualized.  Structurally normal valve. Cusp separation was normal.  Doppler:  Transvalvular velocity was within the normal range. There was no evidence for stenosis. There was no regurgitation.  ------------------------------------------------------------------- Tricuspid valve:   Structurally normal valve.    Doppler: Transvalvular velocity was within the normal range. There was moderate regurgitation.  ------------------------------------------------------------------- Pulmonary artery:   The main pulmonary artery was normal-sized. Systolic pressure was severely increased.  ------------------------------------------------------------------- Right atrium:  The atrium was severely dilated.  ------------------------------------------------------------------- Pericardium:  There was no pericardial effusion.  ------------------------------------------------------------------- Systemic veins: Inferior vena cava: The  vessel was dilated. The respirophasic diameter changes were blunted (< 50%), consistent with elevated central venous pressure.  ------------------------------------------------------------------- Measurements   Left ventricle                            Value          Reference  LV ID, ED, PLAX chordal                   44    mm       43 - 52  LV ID, ES, PLAX chordal                   24    mm       23 - 38  LV fx shortening, PLAX chordal            45    %        >=29  LV PW thickness, ED                       14    mm       ---------  IVS/LV PW ratio, ED                       1              <=1.3  Stroke volume, 2D                         80    ml       ---------  Stroke volume/bsa, 2D                     29    ml/m^2    ---------  LV e&', lateral                            5.91  cm/s     ---------  LV E/e&', lateral                          28.76          ---------  LV e&', medial                             4.81  cm/s     ---------  LV E/e&', medial                           35.34          ---------  LV e&', average                            5.36  cm/s     ---------  LV E/e&', average                          31.72          ---------    Ventricular septum                        Value          Reference  IVS thickness, ED                         14    mm       ---------    LVOT                                      Value          Reference  LVOT ID, S                                20    mm       ---------  LVOT area                                 3.14  cm^2     ---------  LVOT peak velocity, S                     122   cm/s     ---------  LVOT mean velocity, S                     79.6  cm/s     ---------  LVOT VTI, S                               25.6  cm       ---------  LVOT peak gradient, S                     6     mm Hg    ---------    Aortic valve                              Value          Reference  Aortic valve peak velocity, S             437   cm/s     ---------  Aortic valve mean velocity, S             267   cm/s     ---------  Aortic valve VTI, S                       92.2  cm       ---------  Aortic mean gradient, S                   40    mm Hg    ---------  Aortic peak gradient, S                   76    mm Hg    ---------  VTI ratio, LVOT/AV                        0.28           ---------  Aortic valve area, VTI                    0.87  cm^2     ---------  Aortic valve area/bsa, VTI                0.31  cm^2/m^2 ---------  Velocity ratio, peak, LVOT/AV             0.28           ---------  Aortic valve area, peak velocity          0.88  cm^2     ---------  Aortic valve area/bsa, peak               0.32  cm^2/m^2 ---------  velocity  Velocity ratio, mean, LVOT/AV             0.3             ---------  Aortic valve area, mean velocity          0.94  cm^2     ---------  Aortic valve area/bsa, mean               0.34  cm^2/m^2 ---------  velocity  Aortic regurg pressure half-time          402   ms       ---------    Aorta                                     Value          Reference  Aortic root ID, ED                        31    mm       ---------  Ascending aorta ID, A-P, S                31    mm       ---------    Left atrium                               Value          Reference  LA ID, A-P, ES                            78    mm       ---------  LA ID/bsa, A-P                    (H)     2.81  cm/m^2   <=2.2  LA volume, S                              274   ml       ---------  LA volume/bsa, S                          98.8  ml/m^2   ---------  LA volume, ES, 1-p A4C                    266   ml       ---------  LA volume/bsa, ES, 1-p A4C                95.9  ml/m^2   ---------  LA volume, ES, 1-p A2C  282   ml       ---------  LA volume/bsa, ES, 1-p A2C                101.7 ml/m^2   ---------    Mitral valve                              Value          Reference  Mitral E-wave peak velocity               170   cm/s     ---------  Mitral A-wave peak velocity               46.1  cm/s     ---------  Mitral mean velocity, D                   75    cm/s     ---------  Mitral deceleration time                  169   ms       150 - 230  Mitral pressure half-time                 81    ms       ---------  Mitral mean gradient, D                   3     mm Hg    ---------  Mitral peak gradient, D                   12    mm Hg    ---------  Mitral E/A ratio, peak                    3.6            ---------  Mitral valve area, PHT, DP                3.86  cm^2     ---------  Mitral valve area/bsa, PHT, DP            1.39  cm^2/m^2 ---------  Mitral valve area, LVOT                   2.36  cm^2     ---------  continuity  Mitral valve area/bsa, LVOT                0.85  cm^2/m^2 ---------  continuity  Mitral annulus VTI, D                     34.1  cm       ---------    Pulmonary arteries                        Value          Reference  PA pressure, S, DP                (H)     92    mm Hg    <=30    Tricuspid valve                           Value          Reference  Tricuspid regurg peak velocity            440   cm/s     ---------  Tricuspid peak RV-RA gradient             77    mm Hg    ---------    Systemic veins                            Value          Reference  Estimated CVP                             15    mm Hg    ---------    Right ventricle                           Value          Reference  TAPSE                                     16.3  mm       ---------  RV pressure, S, DP                (H)     92    mm Hg    <=30  RV s&', lateral, S                         11.1  cm/s     ---------  EKG:  EKG from 06/10/18 is reviewed by me and shows atrial fib with RBBB, PVC.    Recent Labs: 05/01/2018: ALT 12 06/10/2018: B Natriuretic Peptide 685.0; Hemoglobin 11.8; Platelets 218 06/15/2018: BUN 18; Creatinine, Ser 1.11; Potassium 2.9; Sodium 143     Wt Readings from Last 3 Encounters:  06/27/18 291 lb 12.8 oz (132.4 kg)  06/15/18 (!) 310 lb 9.6 oz (140.9 kg)  06/10/18 297 lb (134.7 kg)     Other studies Reviewed: Additional studies/ records that were reviewed today include: echo images, office notes, EKG Review of the above records demonstrates: severe AS   Assessment and Plan:   1. Severe aortic valve stenosis: He has severe, stage D aortic valve stenosis. I have personally reviewed the echo images. The aortic valve is thickened, calcified with limited leaflet mobility. I think he would benefit from AVR. Given morbid obesity and poor mobility, he is not a good candidate for conventional AVR by surgical approach. I think he may be a good candidate for TAVR.   STS Risk Score:  Procedure: Isolated AVR  Risk of Mortality: 2.879%   Renal Failure: 7.782%  Permanent Stroke: 1.672%  Prolonged Ventilation: 15.154%  DSW Infection: 0.232%  Reoperation: 3.703%  Morbidity or Mortality: 21.037%  Short Length of Stay: 21.449%  Long Length of Stay: 11.964%   I have reviewed the natural history of aortic stenosis with the patient and their family members  who are present today. We have discussed the limitations of medical therapy and the poor prognosis associated with symptomatic aortic stenosis. We have reviewed potential treatment options, including palliative medical therapy, conventional surgical aortic valve replacement, and transcatheter aortic valve replacement. We discussed treatment options in the context of the patient's specific comorbid medical conditions.  He would like to proceed with planning for TAVR. I will arrange a right and left heart catheterization at [facility] 07/06/18. Risks and benefits of procedure reviewed with the patient. After the cath, he will have a cardiac CT, CTA of the chest/abdomen and pelvis, PFTs, carotid dopplers and a PT assessment and will then be referred to see one of the CT surgeons on our TAVR team.    Current medicines are reviewed at length with the patient today.  The patient does not have concerns regarding medicines.  The following changes have been made:  no change  Labs/ tests ordered today include:   Orders Placed This Encounter  Procedures  . CBC  . Basic Metabolic Panel (BMET)   Disposition:   FU with the valve team.    Signed, Verne Carrow, MD 06/27/2018 10:42 AM    [facility] [address] [place], [place], [name]  [address] Phone: [phone]; Fax: [phone]

## 2018-07-02 ENCOUNTER — Other Ambulatory Visit: Payer: Self-pay | Admitting: Family Medicine

## 2018-07-04 ENCOUNTER — Telehealth: Payer: Self-pay | Admitting: *Deleted

## 2018-07-04 NOTE — Telephone Encounter (Signed)
Pt contacted pre-catheterization scheduled [facility] for: Thursday July 06, 2018 12 noon Verified arrival [facility] and [facility]: [facility] Main Entr[facility] A at: 10 AM  No solid food after midnight prior to cath, clear liquids until 5 AM day of procedure. Verified allergies in Epic Verified no diabetes medications.  Hold: Torsemide AM of procedure. KCl AM of procedure.  Except hold medications AM meds can be  taken pre-cath with sip of water including: ASA 81 mg  Confirmed patient has responsible person to drive home post procedure and for 24 hours after you arrive home: yes

## 2018-07-06 ENCOUNTER — Encounter (HOSPITAL_COMMUNITY)
Admission: AD | Disposition: A | Discharge status: Home / Self Care | Payer: Self-pay | Source: Ambulatory Visit | Attending: Internal Medicine

## 2018-07-06 ENCOUNTER — Other Ambulatory Visit: Payer: Self-pay

## 2018-07-06 ENCOUNTER — Encounter (HOSPITAL_COMMUNITY): Payer: Self-pay | Admitting: Emergency Medicine

## 2018-07-06 ENCOUNTER — Inpatient Hospital Stay (HOSPITAL_COMMUNITY)
Admission: AD | Admit: 2018-07-06 | Discharge: 2018-07-16 | DRG: 982 | Disposition: A | Discharge status: Home / Self Care | Payer: Medicare Other | Source: Ambulatory Visit | Attending: Internal Medicine | Admitting: Internal Medicine

## 2018-07-06 DIAGNOSIS — N200 Calculus of kidney: Secondary | ICD-10-CM | POA: Diagnosis not present

## 2018-07-06 DIAGNOSIS — I5033 Acute on chronic diastolic (congestive) heart failure: Secondary | ICD-10-CM | POA: Diagnosis present

## 2018-07-06 DIAGNOSIS — K068 Other specified disorders of gingiva and edentulous alveolar ridge: Secondary | ICD-10-CM | POA: Diagnosis not present

## 2018-07-06 DIAGNOSIS — Z8249 Family history of ischemic heart disease and other diseases of the circulatory system: Secondary | ICD-10-CM

## 2018-07-06 DIAGNOSIS — I350 Nonrheumatic aortic (valve) stenosis: Secondary | ICD-10-CM

## 2018-07-06 DIAGNOSIS — G4733 Obstructive sleep apnea (adult) (pediatric): Secondary | ICD-10-CM | POA: Diagnosis present

## 2018-07-06 DIAGNOSIS — I700 Atherosclerosis of aorta: Secondary | ICD-10-CM | POA: Diagnosis not present

## 2018-07-06 DIAGNOSIS — I272 Pulmonary hypertension, unspecified: Secondary | ICD-10-CM | POA: Diagnosis present

## 2018-07-06 DIAGNOSIS — I2510 Atherosclerotic heart disease of native coronary artery without angina pectoris: Secondary | ICD-10-CM | POA: Diagnosis not present

## 2018-07-06 DIAGNOSIS — K045 Chronic apical periodontitis: Secondary | ICD-10-CM | POA: Diagnosis present

## 2018-07-06 DIAGNOSIS — E785 Hyperlipidemia, unspecified: Secondary | ICD-10-CM | POA: Diagnosis present

## 2018-07-06 DIAGNOSIS — E119 Type 2 diabetes mellitus without complications: Secondary | ICD-10-CM | POA: Diagnosis present

## 2018-07-06 DIAGNOSIS — J811 Chronic pulmonary edema: Secondary | ICD-10-CM

## 2018-07-06 DIAGNOSIS — I482 Chronic atrial fibrillation: Secondary | ICD-10-CM | POA: Diagnosis present

## 2018-07-06 DIAGNOSIS — I161 Hypertensive emergency: Secondary | ICD-10-CM | POA: Diagnosis not present

## 2018-07-06 DIAGNOSIS — Z96641 Presence of right artificial hip joint: Secondary | ICD-10-CM | POA: Diagnosis not present

## 2018-07-06 DIAGNOSIS — M264 Malocclusion, unspecified: Secondary | ICD-10-CM | POA: Diagnosis present

## 2018-07-06 DIAGNOSIS — K029 Dental caries, unspecified: Secondary | ICD-10-CM | POA: Diagnosis present

## 2018-07-06 DIAGNOSIS — Z6837 Body mass index (BMI) 37.0-37.9, adult: Secondary | ICD-10-CM | POA: Diagnosis not present

## 2018-07-06 DIAGNOSIS — E876 Hypokalemia: Secondary | ICD-10-CM | POA: Diagnosis not present

## 2018-07-06 DIAGNOSIS — Z538 Procedure and treatment not carried out for other reasons: Secondary | ICD-10-CM | POA: Diagnosis not present

## 2018-07-06 DIAGNOSIS — I5031 Acute diastolic (congestive) heart failure: Secondary | ICD-10-CM

## 2018-07-06 DIAGNOSIS — K06020 Generalized gingival recession, unspecified: Secondary | ICD-10-CM | POA: Diagnosis not present

## 2018-07-06 DIAGNOSIS — K0530 Chronic periodontitis, unspecified: Secondary | ICD-10-CM | POA: Diagnosis not present

## 2018-07-06 DIAGNOSIS — M1990 Unspecified osteoarthritis, unspecified site: Secondary | ICD-10-CM | POA: Diagnosis present

## 2018-07-06 DIAGNOSIS — I110 Hypertensive heart disease with heart failure: Principal | ICD-10-CM | POA: Diagnosis present

## 2018-07-06 DIAGNOSIS — Z79899 Other long term (current) drug therapy: Secondary | ICD-10-CM

## 2018-07-06 DIAGNOSIS — R0902 Hypoxemia: Secondary | ICD-10-CM | POA: Diagnosis not present

## 2018-07-06 DIAGNOSIS — I5030 Unspecified diastolic (congestive) heart failure: Secondary | ICD-10-CM | POA: Diagnosis not present

## 2018-07-06 DIAGNOSIS — R918 Other nonspecific abnormal finding of lung field: Secondary | ICD-10-CM | POA: Diagnosis not present

## 2018-07-06 DIAGNOSIS — R0600 Dyspnea, unspecified: Secondary | ICD-10-CM | POA: Diagnosis not present

## 2018-07-06 DIAGNOSIS — Z9884 Bariatric surgery status: Secondary | ICD-10-CM

## 2018-07-06 DIAGNOSIS — K031 Abrasion of teeth: Secondary | ICD-10-CM | POA: Diagnosis not present

## 2018-07-06 DIAGNOSIS — R0609 Other forms of dyspnea: Secondary | ICD-10-CM

## 2018-07-06 DIAGNOSIS — K036 Deposits [accretions] on teeth: Secondary | ICD-10-CM | POA: Diagnosis not present

## 2018-07-06 DIAGNOSIS — K0889 Other specified disorders of teeth and supporting structures: Secondary | ICD-10-CM | POA: Diagnosis not present

## 2018-07-06 DIAGNOSIS — Z7982 Long term (current) use of aspirin: Secondary | ICD-10-CM

## 2018-07-06 DIAGNOSIS — Z01810 Encounter for preprocedural cardiovascular examination: Secondary | ICD-10-CM | POA: Diagnosis not present

## 2018-07-06 HISTORY — PX: RIGHT HEART CATH: CATH118263

## 2018-07-06 LAB — CBC WITH DIFFERENTIAL/PLATELET
Abs Immature Granulocytes: 0 10*3/uL (ref 0.0–0.1)
BASOS ABS: 0.1 10*3/uL (ref 0.0–0.1)
BASOS PCT: 1 %
EOS ABS: 0 10*3/uL (ref 0.0–0.7)
EOS PCT: 0 %
HCT: 34 % — ABNORMAL LOW (ref 39.0–52.0)
HEMOGLOBIN: 11.1 g/dL — AB (ref 13.0–17.0)
Immature Granulocytes: 0 %
Lymphocytes Relative: 13 %
Lymphs Abs: 1 10*3/uL (ref 0.7–4.0)
MCH: 24.3 pg — AB (ref 26.0–34.0)
MCHC: 32.6 g/dL (ref 30.0–36.0)
MCV: 74.6 fL — ABNORMAL LOW (ref 78.0–100.0)
MONO ABS: 0.9 10*3/uL (ref 0.1–1.0)
Monocytes Relative: 12 %
Neutro Abs: 5.5 10*3/uL (ref 1.7–7.7)
Neutrophils Relative %: 74 %
PLATELETS: 218 10*3/uL (ref 150–400)
RBC: 4.56 MIL/uL (ref 4.22–5.81)
RDW: 17.7 % — AB (ref 11.5–15.5)
WBC: 7.5 10*3/uL (ref 4.0–10.5)

## 2018-07-06 LAB — COMPREHENSIVE METABOLIC PANEL
ALBUMIN: 3.3 g/dL — AB (ref 3.5–5.0)
ALK PHOS: 137 U/L — AB (ref 38–126)
ALT: 17 U/L (ref 0–44)
ANION GAP: 12 (ref 5–15)
AST: 27 U/L (ref 15–41)
BUN: 12 mg/dL (ref 8–23)
CALCIUM: 9.1 mg/dL (ref 8.9–10.3)
CO2: 25 mmol/L (ref 22–32)
Chloride: 105 mmol/L (ref 98–111)
Creatinine, Ser: 0.94 mg/dL (ref 0.61–1.24)
GFR calc non Af Amer: >60 mL/min (ref >60)
Glucose, Bld: 96 mg/dL (ref 70–99)
POTASSIUM: 3.4 mmol/L — AB (ref 3.5–5.1)
SODIUM: 142 mmol/L (ref 135–145)
Total Bilirubin: 3.2 mg/dL — ABNORMAL HIGH (ref 0.3–1.2)
Total Protein: 7.2 g/dL (ref 6.5–8.1)

## 2018-07-06 LAB — MRSA PCR SCREENING: MRSA BY PCR: NEGATIVE

## 2018-07-06 LAB — MAGNESIUM: MAGNESIUM: 2 mg/dL (ref 1.7–2.4)

## 2018-07-06 LAB — BRAIN NATRIURETIC PEPTIDE: B Natriuretic Peptide: 654.2 pg/mL — ABNORMAL HIGH (ref 0.0–100.0)

## 2018-07-06 SURGERY — RIGHT HEART CATH

## 2018-07-06 MED ORDER — ONDANSETRON HCL 4 MG/2ML IJ SOLN: 4.0000 mg | Freq: Four times a day (QID) | INTRAMUSCULAR | Status: DC | PRN

## 2018-07-06 MED ORDER — FUROSEMIDE 10 MG/ML IJ SOLN
40.0000 mg | Freq: Two times a day (BID) | INTRAMUSCULAR | Status: DC
  Administered 2018-07-06 – 2018-07-09 (×6): 40 mg via INTRAVENOUS
  Filled 2018-07-06 (×6): qty 4

## 2018-07-06 MED ORDER — ENOXAPARIN SODIUM 60 MG/0.6ML ~~LOC~~ SOLN
60.0000 mg | SUBCUTANEOUS | Status: DC
  Administered 2018-07-06 – 2018-07-08 (×3): 60 mg via SUBCUTANEOUS
  Administered 2018-07-09: 30 mg via SUBCUTANEOUS
  Administered 2018-07-10 – 2018-07-11 (×2): 60 mg via SUBCUTANEOUS
  Filled 2018-07-06 (×6): qty 0.6

## 2018-07-06 MED ORDER — HEPARIN (PORCINE) IN NACL 2-0.9 UNITS/ML: INTRAMUSCULAR | Status: DC | PRN

## 2018-07-06 MED ORDER — FUROSEMIDE 10 MG/ML IJ SOLN: 80.0000 mg | Freq: Two times a day (BID) | INTRAMUSCULAR | Status: DC

## 2018-07-06 MED ORDER — ACETAMINOPHEN 325 MG PO TABS: 650.0000 mg | ORAL_TABLET | ORAL | Status: DC | PRN

## 2018-07-06 MED ORDER — SODIUM CHLORIDE 0.9% FLUSH
3.0000 mL | INTRAVENOUS | Status: DC | PRN
  Administered 2018-07-08 – 2018-07-12 (×2): 3 mL via INTRAVENOUS
  Filled 2018-07-06 (×2): qty 3

## 2018-07-06 MED ORDER — NITROGLYCERIN IN D5W 200-5 MCG/ML-% IV SOLN: 0.0000 ug/min | INTRAVENOUS | Status: DC

## 2018-07-06 MED ORDER — SODIUM CHLORIDE 0.9% FLUSH: 3.0000 mL | Freq: Two times a day (BID) | INTRAVENOUS | Status: DC

## 2018-07-06 MED ORDER — MORPHINE SULFATE (PF) 10 MG/ML IV SOLN
INTRAVENOUS | Status: AC
  Filled 2018-07-06: qty 1

## 2018-07-06 MED ORDER — FUROSEMIDE 10 MG/ML IJ SOLN
INTRAMUSCULAR | Status: AC
  Filled 2018-07-06: qty 4

## 2018-07-06 MED ORDER — SODIUM CHLORIDE 0.9% FLUSH: 3.0000 mL | INTRAVENOUS | Status: DC | PRN

## 2018-07-06 MED ORDER — HEPARIN (PORCINE) IN NACL 1000-0.9 UT/500ML-% IV SOLN
INTRAVENOUS | Status: DC | PRN
  Administered 2018-07-06 (×2): 500 mL

## 2018-07-06 MED ORDER — FUROSEMIDE 10 MG/ML IJ SOLN
40.0000 mg | Freq: Once | INTRAMUSCULAR | Status: AC
  Administered 2018-07-06: 40 mg via INTRAVENOUS

## 2018-07-06 MED ORDER — LIDOCAINE HCL (PF) 1 % IJ SOLN
INTRAMUSCULAR | Status: AC
  Filled 2018-07-06: qty 30

## 2018-07-06 MED ORDER — LIDOCAINE HCL (PF) 1 % IJ SOLN
INTRAMUSCULAR | Status: DC | PRN
  Administered 2018-07-06: 5 mL

## 2018-07-06 MED ORDER — ASPIRIN 81 MG PO CHEW: 81.0000 mg | CHEWABLE_TABLET | ORAL | Status: DC

## 2018-07-06 MED ORDER — NITROGLYCERIN IN D5W 200-5 MCG/ML-% IV SOLN
INTRAVENOUS | Status: AC
  Filled 2018-07-06: qty 250

## 2018-07-06 MED ORDER — MORPHINE SULFATE (PF) 10 MG/ML IV SOLN
INTRAVENOUS | Status: DC | PRN
  Administered 2018-07-06: 2 mg via INTRAVENOUS

## 2018-07-06 MED ORDER — HEPARIN (PORCINE) IN NACL 1000-0.9 UT/500ML-% IV SOLN
INTRAVENOUS | Status: AC
  Filled 2018-07-06: qty 1000

## 2018-07-06 MED ORDER — NITROGLYCERIN IN D5W 200-5 MCG/ML-% IV SOLN
INTRAVENOUS | Status: AC | PRN
  Administered 2018-07-06: 10 ug/min via INTRAVENOUS

## 2018-07-06 MED ORDER — SODIUM CHLORIDE 0.9 % IV SOLN: 250.0000 mL | INTRAVENOUS | Status: DC | PRN

## 2018-07-06 MED ORDER — DILTIAZEM HCL ER COATED BEADS 120 MG PO CP24
120.0000 mg | ORAL_CAPSULE | Freq: Every day | ORAL | Status: DC
  Administered 2018-07-07 – 2018-07-16 (×10): 120 mg via ORAL
  Filled 2018-07-06 (×10): qty 1

## 2018-07-06 MED ORDER — POTASSIUM CHLORIDE CRYS ER 20 MEQ PO TBCR
20.0000 meq | EXTENDED_RELEASE_TABLET | Freq: Two times a day (BID) | ORAL | Status: DC
Start: 2018-07-06 — End: 2018-07-09
  Administered 2018-07-06 – 2018-07-09 (×6): 20 meq via ORAL
  Filled 2018-07-06 (×6): qty 1

## 2018-07-06 MED ORDER — VERAPAMIL HCL 2.5 MG/ML IV SOLN
INTRAVENOUS | Status: AC
  Filled 2018-07-06: qty 2

## 2018-07-06 MED ORDER — SODIUM CHLORIDE 0.9% FLUSH
3.0000 mL | Freq: Two times a day (BID) | INTRAVENOUS | Status: DC
  Administered 2018-07-06 – 2018-07-16 (×14): 3 mL via INTRAVENOUS

## 2018-07-06 MED ORDER — HEPARIN SODIUM (PORCINE) 1000 UNIT/ML IJ SOLN
INTRAMUSCULAR | Status: AC
  Filled 2018-07-06: qty 1

## 2018-07-06 MED ORDER — ASPIRIN EC 81 MG PO TBEC
81.0000 mg | DELAYED_RELEASE_TABLET | Freq: Every day | ORAL | Status: DC
  Administered 2018-07-07: 81 mg via ORAL
  Filled 2018-07-06: qty 1

## 2018-07-06 MED ORDER — FUROSEMIDE 10 MG/ML IJ SOLN
INTRAMUSCULAR | Status: DC | PRN
  Administered 2018-07-06: 40 mg via INTRAVENOUS

## 2018-07-06 MED ORDER — SODIUM CHLORIDE 0.9 % IV SOLN
INTRAVENOUS | Status: DC
  Administered 2018-07-06: 11:00:00 via INTRAVENOUS

## 2018-07-06 MED ORDER — ATORVASTATIN CALCIUM 80 MG PO TABS
80.0000 mg | ORAL_TABLET | Freq: Every day | ORAL | Status: DC
  Administered 2018-07-07 – 2018-07-15 (×9): 80 mg via ORAL
  Filled 2018-07-06 (×9): qty 1

## 2018-07-06 MED ORDER — SODIUM CHLORIDE 0.9% FLUSH
3.0000 mL | Freq: Two times a day (BID) | INTRAVENOUS | Status: DC
  Administered 2018-07-06 – 2018-07-16 (×8): 3 mL via INTRAVENOUS

## 2018-07-06 SURGICAL SUPPLY — 12 items
CATH BALLN WEDGE 5F 110CM (CATHETERS) ×3 IMPLANT
CATH EXPO 5F MPA-1 (CATHETERS) ×3 IMPLANT
GLIDESHEATH SLEND SS 6F .021 (SHEATH) ×3 IMPLANT
GUIDEWIRE .025 260CM (WIRE) ×3 IMPLANT
GUIDEWIRE INQWIRE 1.5J.035X260 (WIRE) ×1 IMPLANT
INQWIRE 1.5J .035X260CM (WIRE) ×3
KIT HEART LEFT (KITS) ×3 IMPLANT
PACK CARDIAC CATHETERIZATION (CUSTOM PROCEDURE TRAY) ×3 IMPLANT
SHEATH GLIDE SLENDER 4/5FR (SHEATH) ×3 IMPLANT
SHIELD RADPAD SCOOP 12X17 (MISCELLANEOUS) ×3 IMPLANT
TRANSDUCER W/STOPCOCK (MISCELLANEOUS) ×3 IMPLANT
TUBING CIL FLEX 10 FLL-RA (TUBING) ×3 IMPLANT

## 2018-07-06 NOTE — Progress Notes (Signed)
Pt transferred on BIPAP from cath overflow to 2C08 by RT. Once pt was in new room, he requested to come off BIPAP. RT placed pt on 3L [place] and VS stable and no signs of distress noted. RN aware.

## 2018-07-06 NOTE — Interval H&P Note (Signed)
History and Physical Interval Note:  07/06/2018 12:34 PM  Joel Rogers  has presented today for cardiac cath with the diagnosis of Aortic Stenosis  The various methods of treatment have been discussed with the patient and family. After consideration of risks, benefits and other options for treatment, the patient has consented to  Procedure(s): RIGHT/LEFT HEART CATH AND CORONARY ANGIOGRAPHY (N/A) as a surgical intervention .  The patient's history has been reviewed, patient examined, no change in status, stable for surgery.  I have reviewed the patient's chart and labs.  Questions were answered to the patient's satisfaction.     Cath Lab Visit (complete for each Cath Lab visit)  Clinical Evaluation Leading to the Procedure:   ACS: No.  Non-ACS:    Anginal Classification: CCS I  Anti-ischemic medical therapy: Minimal Therapy (1 class of medications)  Non-Invasive Test Results: No non-invasive testing performed  Prior CABG: No previous CABG        Joel Rogers Joel Rogers

## 2018-07-06 NOTE — Progress Notes (Signed)
RT NOTE:  Pt refuses CPAP tonight. He has worn CPAP for years past with OSA history. However, patient stated that he has had 2 sleep studies that say he no longer needs to wear. Pt has had a significant weight loss over the recent years of 150lbs+. Pt will wear 2L [place] sleeping. RT explained we will monitor and let him know if there is ant reason to put him on CPAP.

## 2018-07-06 NOTE — H&P (Addendum)
Advanced Heart Failure Team History and Physical Note   PCP:  Dorena Bodoixon, [name], PA-C  PCP-Cardiology: Allyson SabalBerry    Reason for Admission: Acute on chronic diastolic CHF, Severe AS  HPI:    Joel Rogers is a 67 y.o. male with h/o morbid obesity, pulmonary HTN, chronic a fibrillation, sleep apnea, HTN, HLD, chronic venous insufficiency, chronic diastolic CHF, and severe Aortic stenosis.    Pt seen by Dr. Clifton JamesMcAlhany 06/27/18 in consultation from Dr. Allyson SabalBerry for consideration of TAVR due to his severe AS. At that visit, c/o more DOE with increasing peripheral edema. Planned for [facility]/RHC for further evaluation.   He is currently followed by Dr. Allyson SabalBerry. He was catheterized by Dr. Lavonne ChickBill Gamble in 2007 and found to have normal coronary arteries and normal coronary function. He had laparoscopic Roux-en-Y gastric bypass surgery in November of 2009 [facility] and has lost over 140 pounds. Over the past year has developed progressive aortic stenosis. In 12/17 had fall/syncopal episode with SDH requiring evacuation and AC stopped.   Echo 8/18 EF 55-60% Aov mean gradient 35 RVSP 64. Normal RV Echo 3/19 EF 60-65% Aov mean gradient 40 RVSP 92. Grade III DD  RV severely dialted  Pt presented today as planned for [facility]/RHC. RHC started which showed severe elevations of RA and RV pressure. Unable to obtain PA pressures, and case aborted due to acute respiratory distress. Pt given IV lasix and started on BiPAP. Nitro gtt ordered with BP in 220s. HF team called for admission and optimization [facility].  Pre-cath labs from 06/27/18 include Cr 1.06, K 4.0, Hgb 10.9, and WBC 7.7.  Labs pending today.   Re-examined after approx 20 minutes on BiPAP and Nitro gtt. Feeling better. Sitting upright in bed. SBP down to 170. States he was feeling about the same as his appointment with Dr. Clifton JamesMcAlhany last week. He sleeps upright chronically and "never" lies flat". Got steadily more SOB and attempt to cath continued. He says he  carries fluid in his thighs that has been worse recently.  Wears compression hose.   RHC 07/06/18 ( Incomplete due to decompensation) RA mean  26 mm Hg RV 107/7  RVEDP 24  Review of Systems: [y] = yes, [ ]  = no   General: Weight gain [y]; Weight loss [place][y ]; Anorexia [ ] ; Fatigue [y]; Fever [ ] ; Chills [ ] ; Weakness [ ]   Cardiac: Chest pain/pressure [ ] ; Resting SOB [y]; Exertional SOB [y]; Orthopnea [y]; Pedal Edema [y]; Palpitations [ ] ; Syncope [ ] ; Presyncope [ ] ; Paroxysmal nocturnal dyspnea[ ]   Pulmonary: Cough [ ] ; Wheezing[ ] ; Hemoptysis[ ] ; Sputum [ ] ; Snoring [ ]   GI: Vomiting[ ] ; Dysphagia[ ] ; Melena[ ] ; Hematochezia [ ] ; Heartburn[ ] ; Abdominal pain [ ] ; Constipation [ ] ; Diarrhea [ ] ; BRBPR [ ]   GU: Hematuria[ ] ; Dysuria [ ] ; Nocturia[ ]   Vascular: Pain in legs with walking [ ] ; Pain in feet with lying flat [ ] ; Non-healing sores [ ] ; Stroke [ ] ; TIA [ ] ; Slurred speech [ ] ;  Neuro: Headaches[ ] ; Vertigo[ ] ; Seizures[ ] ; Paresthesias[ ] ;Blurred vision [ ] ; Diplopia [ ] ; Vision changes [ ]   Ortho/Skin: Arthritis [y]; Joint pain [y]; Muscle pain [ ] ; Joint swelling [ ] ; Back Pain [ ] ; Rash [ ]   Psych: Depression[ ] ; Anxiety[ ]   Heme: Bleeding problems [ ] ; Clotting disorders [ ] ; Anemia [ ]   Endocrine: Diabetes [ ] ; Thyroid dysfunction[ ]   Home Medications Prior to Admission medications  Medication Sig Start Date End Date Taking? Authorizing Provider  acetaminophen (TYLENOL) 650 MG CR tablet Take 650 mg by mouth 2 (two) times daily as needed for pain.   Yes [provider]  aspirin EC 81 MG tablet Take 1 tablet (81 mg total) by mouth daily. 12/02/16  Yes Eustace MooreNelson, Yvonne Sue, MD  atorvastatin (LIPITOR) 80 MG tablet TAKE ONE TABLET BY MOUTH ONCE DAILY WITH BREAKFAST Patient taking differently: Take 80 mg by mouth daily.  05/24/17  Yes Runell GessBerry, Jonathan J, MD  colchicine 0.6 MG tablet Take 0.6 mg by mouth daily as needed (gout flare).    Yes [provider]  diltiazem  (CARDIZEM CD) 120 MG 24 hr capsule Take 1 capsule (120 mg total) by mouth daily. 05/31/18  Yes Allayne Butcherixon, [name], PA-C  potassium chloride SA (K-DUR,KLOR-CON) 20 MEQ tablet Take 1 tablet (20 mEq total) by mouth 2 (two) times daily. 06/16/18  Yes Kilroy, Luke K, PA-C  torsemide (DEMADEX) 20 MG tablet Take 40 mg by mouth 2 (two) times daily.    Yes [provider]  Vitamin D, Ergocalciferol, (DRISDOL) 50000 units CAPS capsule TAKE ONE CAPSULE BY MOUTH EVERY 7 DAYS Patient taking differently: Take 50,000 Units by mouth every 7 (seven) days.  05/24/17  Yes Eustace MooreNelson, Yvonne Sue, MD  ferrous sulfate 325 (65 FE) MG tablet Take 1 tablet (325 mg total) by mouth 3 (three) times daily. Patient not taking: Reported on 06/29/2018 05/18/18   Allayne Butcherixon, [name], PA-C  ibuprofen (ADVIL,MOTRIN) 800 MG tablet TAKE 1 TABLET BY MOUTH EVERY 8 HOURS AS NEEDED FOR MODERATE PAIN Patient not taking: Reported on 06/29/2018 05/31/18   Deon Pillingixon, [name], PA-C   Past Medical History: Past Medical History:  Diagnosis Date  . Arthritis   . Atrial fibrillation, chronic (HCC)   . Chronic diastolic CHF (congestive heart failure) (HCC) 06/15/2018  . Diabetes mellitus without complication (HCC)    borderline  . Hyperlipidemia   . Hypertension   . Morbid obesity (HCC)   . Normal coronary arteries    by cardiac catheterization performed 03/14/06  . Obesity   . Sleep apnea    on CPAP  . Venous insufficiency    Past Surgical History: Past Surgical History:  Procedure Laterality Date  . CRANIOTOMY Right 08/27/2016   Procedure: CRANIOTOMY HEMATOMA EVACUATION SUBDURAL;  Surgeon: Maeola HarmanJoseph Stern, MD;  [facility];  Service: Neurosurgery;  Laterality: Right;  . GASTRIC BYPASS  09/2008  . JOINT REPLACEMENT  08/31/2006   right hip  . TOTAL HIP ARTHROPLASTY     right hip   Family History:  Family History  Problem Relation Age of Onset  . Hypertension Father   . Cancer Father        prob prostate  . Alzheimer's disease Mother   .  Alzheimer's disease Maternal Grandfather   . Breast cancer Sister   . Cancer Brother        "brain tumor"   Social History: Social History   Socioeconomic History  . Marital status: Married    Spouse name: Adela LankJacqueline  . Number of children: 2  . Years of education: 3116  . Highest education level: Not on file  Occupational History  . 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 Financial resource strain: Not on file  . Food insecurity:    Worry: Not on file    Inability: Not on file  . Transportation needs:  Medical: Not on file    Non-medical: Not on file  Tobacco Use  . Smoking status: Never Smoker  . Smokeless tobacco: Never Used  Substance and Sexual Activity  . Alcohol use: No  . Drug use: No  . Sexual activity: Not Currently  Lifestyle  . Physical activity:    Days per week: Not on file    Minutes per session: Not on file  . Stress: Not on file  Relationships  . Social connections:    Talks on phone: Not on file    Gets together: Not on file    Attends religious service: Not on file    Active member of club or organization: Not on file    Attends meetings of clubs or organizations: Not on file    Relationship status: Not on file  Other Topics Concern  . 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 SOB  appearing. BiPAP in place.  HEENT: + BiPAP Neck: Supple. JVP to ear.  Carotids 2+ bilat; no bruits. No lymphadenopathy or thyromegaly appreciated. Cor: PMI nondisplaced. Tachy irregular. 2/6 AS.  Lungs: Diminished throughout.  Abdomen: Obese, nontender, nondistended. No hepatosplenomegaly. No bruits or masses. Good bowel sounds. Extremities: No cyanosis, clubbing, or rash. 3+ chronic edema into thighs and flanks.  Neuro: Alert & oriented x 3, cranial nerves grossly intact. moves all 4 extremities w/o difficulty. Affect flat but appropriate on BiPAP.   Telemetry   Afib RVR in setting of decompensation, personally reviewed.   EKG   Pending.   Labs     Basic Metabolic Panel: No results for input(s): NA, K, CL, CO2, GLUCOSE, BUN, CREATININE, CALCIUM, MG, PHOS in the last 168 hours.  Liver Function Tests: No results for input(s): AST, ALT, ALKPHOS, BILITOT, PROT, ALBUMIN in the last 168 hours. No results for input(s): LIPASE, AMYLASE in the last 168 hours. No results for input(s): AMMONIA in the last 168 hours.  CBC: No results for input(s): WBC, NEUTROABS, HGB, HCT, MCV, PLT in the last 168 hours.  Cardiac Enzymes: No results for input(s): CKTOTAL, CKMB, CKMBINDEX, TROPONINI in the last 168 hours.  BNP: BNP (last 3 results) Recent Labs    06/10/18 2322  BNP 685.0*    ProBNP (last 3 results) No results for input(s): PROBNP in the last 8760 hours.   CBG: No results for input(s): GLUCAP in the last 168 hours.  Coagulation Studies: No results for input(s): LABPROT, INR in the last 72 hours.  Imaging: No results found.   Patient Profile   Joel Rogers is a 67 y.o. male with h/o morbid obesity, pulmonary HTN, chronic a fibrillation, sleep apnea, HTN, HLD, chronic venous insufficiency, chronic diastolic CHF, and severe Aortic stenosis.   [facility] for  L/RHC. Aborted due to resp distress requiring BiPAP in setting of A/C diastolic CHF and pulmonary  hypertension.   Assessment/Plan   1. Acute on chronic diastolic (congestive) heart failure  - Decompensated in cath lab requiring BiPAP and Nitro gtt for hypertensive emergency.  Pt seen in ED 7/20 with increasing DOE. Diuretic increased - Volulme status marked elevated.  - Increase lasix to 80 mg IV BID (will give additional now to total 20) - Will benefit from spiro once stable. (Had hypoK of 2.9 3 weeks ago) - He has a significant amount of fluid, likely 20-30 lbs. Will need large diuresis prior to repeat cath attempt.   2. Acute hypoxic resp distress - BiPAP started with symptomatic relief. Continue until BP better controlled.  - Should improve as diuresed and BP brought down.   3. Hypertensive Emergency - BP in 220s systolic with pulmonary edema.  - Will start nitro gtt and titrate as needed.   4. Severe aortic stenosis - Echo March 2019- severe AS, AVA 0.88, peak 76 mmHg, pulmonary HTN- PA 92 mmHg - Will use nitro gtt very cautiously.  5. Morbid obesity  - Body mass index is 37.7 kg/m.  - h/o gastric bypass. Lost 140 lbs.   6. Obstructive sleep apnea - OSA improved on sleep study 05/16/18, likely with weight loss.   7. Chronic atrial fibrillation  - Taken off anticoagulation secondary to SDH after a fall Dec 2017- - Rate elevated currently in setting of decompensation.   8. Poor dentition - He will need dental consult if TAVR considered.    Graciella Freer, PA-C 07/06/2018, 2:34 PM  Advanced Heart Failure Team Pager [phone] (M-F; 7a - 4p)  Please contact [facility] for night-coverage after hours (4p -7a ) and weekends on [email or web address]  .Agree with above.   Mr. Doris is a 67 y/o male with morbid obesity (s/p gastric bypass), permanent AF, OSA, HTN and severe AS whom we are asked to see urgently by Dr. Clifton James due to acute respiratory failure in the setting of pulmonary edema and HTN crisis in the cath lab. Notes progressive SOB over past few months.  Has been following with Dr. Allyson Sabal. Recent echo showed normal LVEF but progressive AS (now severe), RV dysfunction and pulmonary HTN. In cath lab today able to get RV pressure but then patient became acutely orthopneic and desaturated. I was in the room at the time and we gave him IV lasix, morphine and started NTG drip. Placed on BIPAP emergently. Patient taken off cath table and moved to holding area and improved slowly.   JVP to jaw Carotids 1+ with radiated bruits Cor IRR 3/6 AS Lungs crackles anteriorly Ab: obese NT/ND Ext 2-3+ edema into thighs. wartm  He has severe diastolig HF and respiratory distress due to HTN crisis and severe AS. Continue BIPAP, IV lasix and IV NTG (carefully). Will need significant diureses and then can proceed with R/L cath with ey toward TAVR.   CRITICAL CARE Performed by: Arvilla Meres  Total critical care time: 35 minutes  Critical care time was exclusive of separately billable procedures and treating other patients.  Critical care was necessary to treat or prevent imminent or life-threatening deterioration.  Critical care was time spent personally by me (independent of midlevel providers or residents) on the following activities: development of treatment plan with patient and/or surrogate as well as nursing, discussions with consultants, evaluation of patient's response to treatment, examination of patient, obtaining history from patient or surrogate, ordering and  performing treatments and interventions, ordering and review of laboratory studies, ordering and review of radiographic studies, pulse oximetry and re-evaluation of patient's condition.  Arvilla Meres, MD  10:41 PM

## 2018-07-07 ENCOUNTER — Inpatient Hospital Stay (HOSPITAL_COMMUNITY): Payer: Medicare Other

## 2018-07-07 ENCOUNTER — Encounter (HOSPITAL_COMMUNITY): Payer: Self-pay | Admitting: Cardiovascular Disease

## 2018-07-07 DIAGNOSIS — I5030 Unspecified diastolic (congestive) heart failure: Secondary | ICD-10-CM

## 2018-07-07 DIAGNOSIS — I482 Chronic atrial fibrillation: Secondary | ICD-10-CM

## 2018-07-07 LAB — BASIC METABOLIC PANEL
Anion gap: 12 (ref 5–15)
BUN: 12 mg/dL (ref 8–23)
CALCIUM: 9.1 mg/dL (ref 8.9–10.3)
CO2: 26 mmol/L (ref 22–32)
CREATININE: 1.02 mg/dL (ref 0.61–1.24)
Chloride: 104 mmol/L (ref 98–111)
GFR calc Af Amer: >60 mL/min (ref >60)
GLUCOSE: 95 mg/dL (ref 70–99)
Potassium: 3.5 mmol/L (ref 3.5–5.1)
Sodium: 142 mmol/L (ref 135–145)

## 2018-07-07 LAB — CBC
HEMATOCRIT: 32.8 % — AB (ref 39.0–52.0)
Hemoglobin: 10.8 g/dL — ABNORMAL LOW (ref 13.0–17.0)
MCH: 24.5 pg — ABNORMAL LOW (ref 26.0–34.0)
MCHC: 32.9 g/dL (ref 30.0–36.0)
MCV: 74.4 fL — ABNORMAL LOW (ref 78.0–100.0)
PLATELETS: 220 10*3/uL (ref 150–400)
RBC: 4.41 MIL/uL (ref 4.22–5.81)
RDW: 17.7 % — AB (ref 11.5–15.5)
WBC: 8.1 10*3/uL (ref 4.0–10.5)

## 2018-07-07 LAB — ECHOCARDIOGRAM COMPLETE
HEIGHTINCHES: 72 in
WEIGHTICAEL: 4391.56 [oz_av]

## 2018-07-07 LAB — HIV ANTIBODY (ROUTINE TESTING W REFLEX): HIV SCREEN 4TH GENERATION: NONREACTIVE

## 2018-07-07 MED ORDER — SPIRONOLACTONE 12.5 MG HALF TABLET
12.5000 mg | ORAL_TABLET | Freq: Every day | ORAL | Status: DC
  Administered 2018-07-07 – 2018-07-08 (×2): 12.5 mg via ORAL
  Filled 2018-07-07 (×2): qty 1

## 2018-07-07 MED ORDER — LOSARTAN POTASSIUM 25 MG PO TABS
25.0000 mg | ORAL_TABLET | Freq: Every day | ORAL | Status: DC
  Administered 2018-07-07 – 2018-07-15 (×9): 25 mg via ORAL
  Filled 2018-07-07 (×9): qty 1

## 2018-07-07 NOTE — Care Management Note (Signed)
Case Management Note  Patient Details  Name: Joel LeachRalph L Rogers MRN: [id number] Date of Birth: 1951/04/28  Subjective/Objective:        Pt admitted with HF            Action/Plan:   PTA independent from home.  Pt educated on the importance of daily weights and low sodium diet.   Bedside nurse to walk with pt and request PT eval if deemed neccessary   Expected Discharge Date:                  Expected Discharge Plan:  Home/Self Care(Independent from home, has PCP and denied barriers with obtaining medications.  )  In-House Referral:     Discharge planning Services  CM Consult  Post Acute Care Choice:    Choice offered to:     DME Arranged:    DME Agency:     HH Arranged:    HH Agency:     Status of Service:     If discussed at [employer] Length [employer] Meetings, dates discussed:    Additional Comments:  Cherylann ParrClaxton, Andy Allende S, RN 07/07/2018, 1:31 PM

## 2018-07-07 NOTE — Progress Notes (Addendum)
Advanced Heart Failure Rounding Note  PCP-Cardiologist: No primary care provider on file.   Subjective:    BPs much improved on Nitro gtt at 10.  Diuresed well. Weight down 4 pounds. Breathing better. Still mildly orthopneic. No PND. No CP. Did not wear CPAP overnight, says most recent sleep study with no further OSA. Remains swollen up into his thighs.   Cr 1.02. K 3.5.  Objective:   Weight Range: 124.5 kg Body mass index is 37.23 kg/m.   Vital Signs:   Temp:  [97.7 F (36.5 C)-98.9 F (37.2 C)] 98 F (36.7 C) (08/16 0700) Pulse Rate:  [74-141] 80 (08/16 0700) Resp:  [12-38] 12 (08/16 0700) BP: (121-224)/(53-110) 121/77 (08/16 0700) SpO2:  [93 %-100 %] 98 % (08/16 0700) FiO2 (%):  [40 %-50 %] 40 % (08/15 1403) Weight:  [124.5 kg-126.1 kg] 124.5 kg (08/16 0538) Last BM Date: 07/06/18  Weight change: Filed Weights   07/06/18 0935 07/06/18 1700 07/07/18 0538  Weight: 126.1 kg 126.1 kg 124.5 kg    Intake/Output:   Intake/Output Summary (Last 24 hours) at 07/07/2018 0754 Last data filed at 07/07/2018 0700 Gross per 24 hour  Intake 161.66 ml  Output 1950 ml  Net -1788.34 ml      Physical Exam    General:  Well appearing. No resp difficulty HEENT: Normal anicteric  Neck: Supple. JVP to jaw. Carotids 2+ bilat; no bruits. No lymphadenopathy or thyromegaly appreciated. Cor: PMI nondisplaced. Irregularly irregular. 2/6 AS. s2 diminished Lungs: Diminished basilar sounds.  Abdomen: Obese Soft, nontender, nondistended. No hepatosplenomegaly. No bruits or masses. Good bowel sounds. Extremities: No cyanosis, clubbing, or rash. 2+ edema into thighs.  Neuro: alert & oriented x 3, cranial nerves grossly intact. moves all 4 extremities w/o difficulty. Affect pleasant   Telemetry   Afib 80s, personally reviewed.   EKG    No new tracings.    Labs    CBC Recent Labs    07/06/18 1810 07/07/18 0239  WBC 7.5 8.1  NEUTROABS 5.5  --   HGB 11.1* 10.8*  HCT 34.0*  32.8*  MCV 74.6* 74.4*  PLT 218 220   Basic Metabolic Panel Recent Labs    16/08/9607/15/19 1810 07/07/18 0239  NA 142 142  K 3.4* 3.5  CL 105 104  CO2 25 26  GLUCOSE 96 95  BUN 12 12  CREATININE 0.94 1.02  CALCIUM 9.1 9.1  MG 2.0  --    Liver Function Tests Recent Labs    07/06/18 1810  AST 27  ALT 17  ALKPHOS 137*  BILITOT 3.2*  PROT 7.2  ALBUMIN 3.3*   No results for input(s): LIPASE, AMYLASE in the last 72 hours. Cardiac Enzymes No results for input(s): CKTOTAL, CKMB, CKMBINDEX, TROPONINI in the last 72 hours.  BNP: BNP (last 3 results) Recent Labs    06/10/18 2322 07/06/18 1810  BNP 685.0* 654.2*    ProBNP (last 3 results) No results for input(s): PROBNP in the last 8760 hours.   D-Dimer No results for input(s): DDIMER in the last 72 hours. Hemoglobin A1C No results for input(s): HGBA1C in the last 72 hours. Fasting Lipid Panel No results for input(s): CHOL, HDL, LDLCALC, TRIG, CHOLHDL, LDLDIRECT in the last 72 hours. Thyroid Function Tests No results for input(s): TSH, T4TOTAL, T3FREE, THYROIDAB in the last 72 hours.  Invalid input(s): FREET3  Other results:   Imaging    No results found.  Medications:     Scheduled Medications: . aspirin EC  81 mg Oral Daily  . atorvastatin  80 mg Oral q1800  . diltiazem  120 mg Oral Daily  . enoxaparin (LOVENOX) injection  60 mg Subcutaneous Q24H  . furosemide  40 mg Intravenous Q12H  . potassium chloride SA  20 mEq Oral BID  . sodium chloride flush  3 mL Intravenous Q12H  . sodium chloride flush  3 mL Intravenous Q12H     Infusions: . sodium chloride    . sodium chloride    . nitroGLYCERIN 10 mcg/min (07/06/18 1746)     PRN Medications:  sodium chloride, sodium chloride, acetaminophen, ondansetron (ZOFRAN) IV, sodium chloride flush, sodium chloride flush    Patient Profile   Claudie LeachRalph L Mastrianni is a 67 y.o. male with h/o morbid obesity, pulmonary HTN, chronic a fibrillation, sleep apnea,  HTN, HLD, chronic venous insufficiency, chronic diastolic CHF, and severe Aortic stenosis.   [facility]/RHC. Aborted due to resp distress requiring BiPAP in setting of A/C diastolic CHF and pulmonary hypertension.  Assessment/Plan   1. Acute on chronic diastolic (congestive) heart failure  - Decompensated in cath lab requiring BiPAP and Nitro gtt for hypertensive emergency.  Pt seen in ED 7/20 with increasing DOE. Diuretic increased - Volulme status remains elevated - Continue lasix 80 mg IV BID with good response so far.  - Start spiro 12.5 mg daily.  - Start losartan 25 mg daily for BP.  - Continue diuresis.  2. Acute hypoxic resp distress - BiPAP started with symptomatic relief. Continue until BP better controlled.  - Should improve as diuresed and BP brought down.  3. Hypertensive Emergency - BP much improved this am.  - Wean off nitro gtt.  4. Severe aortic stenosis - Echo March 2019- severe AS, AVA 0.88, peak 76 mmHg, pulmonary HTN- PA 92 mmHg - Wean off Nitro gtt.  5. Morbid obesity  - Body mass index is 37.23 kg/m.  - h/o gastric bypass. Lost 140 lbs.  6. Obstructive sleep apnea - OSA improved on sleep study 05/16/18, likely with weight loss.  - Has stated he no longer needs CPAP.  7. Chronic atrial fibrillation  - Taken off anticoagulation secondary to SDH after a fall Dec 2017- - Rate much improved this am.  8. Poor dentition - He will need dental consult if TAVR considered.No change.   Medication concerns reviewed with patient and pharmacy team. Barriers identified: None at this time.   Length of Stay: 1  Luane SchoolMichael Andrew Tillery, PA-C  07/07/2018, 7:54 AM  Advanced Heart Failure Team Pager [phone] (M-F; 7a - 4p)  Please contact [facility] for night-coverage after hours (4p -7a ) and weekends on [email or web address]  Patient seen and examined with the above-signed Advanced Practice Provider and/or Housestaff. I personally reviewed laboratory  data, imaging studies and relevant notes. I independently examined the patient and formulated the important aspects of the plan. I have edited the note to reflect any of my changes or salient points. I have personally discussed the plan with the patient and/or family.  Improved with IV lasix overnight but still tenuous. Will need ongoing diuresis this weekend and will plan repeat attempt at R/L cath on Monday. RV pressures very high on aborted cath yesterday. They should improved with diuresis and improvement in respiratory status. On previous echo they were in the 60s. If coronaries stable will consider TAVR. Also need to consider re-instituting AC for permanent AF. BP improved. Can stop NTG.   Arvilla Meresaniel Bensimhon, MD  8:51 AM

## 2018-07-07 NOTE — Progress Notes (Signed)
  Echocardiogram 2D Echocardiogram has been performed.  Joel Rogers 07/07/2018, 10:16 AM

## 2018-07-08 LAB — BASIC METABOLIC PANEL
Anion gap: 9 (ref 5–15)
BUN: 15 mg/dL (ref 8–23)
CO2: 27 mmol/L (ref 22–32)
Calcium: 8.9 mg/dL (ref 8.9–10.3)
Chloride: 104 mmol/L (ref 98–111)
Creatinine, Ser: 0.97 mg/dL (ref 0.61–1.24)
GFR calc Af Amer: >60 mL/min (ref >60)
GFR calc non Af Amer: >60 mL/min (ref >60)
Glucose, Bld: 104 mg/dL — ABNORMAL HIGH (ref 70–99)
Potassium: 3.5 mmol/L (ref 3.5–5.1)
Sodium: 140 mmol/L (ref 135–145)

## 2018-07-08 MED ORDER — SPIRONOLACTONE 12.5 MG HALF TABLET
12.5000 mg | ORAL_TABLET | Freq: Once | ORAL | Status: AC
  Administered 2018-07-08: 12.5 mg via ORAL
  Filled 2018-07-08: qty 1

## 2018-07-08 MED ORDER — METOLAZONE 2.5 MG PO TABS
2.5000 mg | ORAL_TABLET | Freq: Two times a day (BID) | ORAL | Status: DC
  Administered 2018-07-08 – 2018-07-09 (×3): 2.5 mg via ORAL
  Filled 2018-07-08 (×3): qty 1

## 2018-07-08 MED ORDER — SPIRONOLACTONE 25 MG PO TABS
25.0000 mg | ORAL_TABLET | Freq: Every day | ORAL | Status: DC
  Administered 2018-07-09 – 2018-07-15 (×7): 25 mg via ORAL
  Filled 2018-07-08 (×7): qty 1

## 2018-07-08 NOTE — Progress Notes (Signed)
Advanced Heart Failure Rounding Note  PCP-Cardiologist: No primary care provider on file.   Subjective:    Feeling better. Continues to diurese on IV lasix. Though weight is recorded as up 2 pounds today. Orthopnea improving. No PND. Stil feels bloated. No CP.   Remains in AF. Rate controlled.    Objective:   Weight Range: 125.4 kg Body mass index is 37.49 kg/m.   Vital Signs:   Temp:  [97.6 F (36.4 C)-99 F (37.2 C)] 98.6 F (37 C) (08/17 1048) Pulse Rate:  [77-87] 83 (08/17 1048) Resp:  [17-26] 17 (08/17 1048) BP: (116-129)/(69-89) 129/77 (08/17 1048) SpO2:  [91 %-99 %] 98 % (08/17 1048) Weight:  [125.4 kg] 125.4 kg (08/17 0632) Last BM Date: 07/06/18  Weight change: Filed Weights   07/06/18 1700 07/07/18 0538 07/08/18 [id number]  Weight: 126.1 kg 124.5 kg 125.4 kg    Intake/Output:   Intake/Output Summary (Last 24 hours) at 07/08/2018 1104 Last data filed at 07/08/2018 0933 Gross per 24 hour  Intake 480 ml  Output 775 ml  Net -295 ml      Physical Exam    General:  Sitting up on side of bed  No resp difficulty HEENT: normal Neck: supple. JVP to jaw Carotids 2+ bilat;+ radiated bruits. No lymphadenopathy or thryomegaly appreciated. Cor: PMI nondisplaced. IRR. IRR 2/6 AS s2 dimished Lungs: clear Abdomen: obese soft, nontender, nondistended. No hepatosplenomegaly. No bruits or masses. Good bowel sounds. Extremities: no cyanosis, clubbing, rash, 2+ woody edema TED hose one Neuro: alert & orientedx3, cranial nerves grossly intact. moves all 4 extremities w/o difficulty. Affect pleasant   Telemetry   Afib 80-90s, personally reviewed.   EKG    No new tracings.    Labs    CBC Recent Labs    07/06/18 1810 07/07/18 0239  WBC 7.5 8.1  NEUTROABS 5.5  --   HGB 11.1* 10.8*  HCT 34.0* 32.8*  MCV 74.6* 74.4*  PLT 218 220   Basic Metabolic Panel Recent Labs    96/02/5407/15/19 1810 07/07/18 0239 07/08/18 0258  NA 142 142 140  K 3.4* 3.5 3.5  CL 105 104  104  CO2 25 26 27   GLUCOSE 96 95 104*  BUN 12 12 15   CREATININE 0.94 1.02 0.97  CALCIUM 9.1 9.1 8.9  MG 2.0  --   --    Liver Function Tests Recent Labs    07/06/18 1810  AST 27  ALT 17  ALKPHOS 137*  BILITOT 3.2*  PROT 7.2  ALBUMIN 3.3*   No results for input(s): LIPASE, AMYLASE in the last 72 hours. Cardiac Enzymes No results for input(s): CKTOTAL, CKMB, CKMBINDEX, TROPONINI in the last 72 hours.  BNP: BNP (last 3 results) Recent Labs    06/10/18 2322 07/06/18 1810  BNP 685.0* 654.2*    ProBNP (last 3 results) No results for input(s): PROBNP in the last 8760 hours.   D-Dimer No results for input(s): DDIMER in the last 72 hours. Hemoglobin A1C No results for input(s): HGBA1C in the last 72 hours. Fasting Lipid Panel No results for input(s): CHOL, HDL, LDLCALC, TRIG, CHOLHDL, LDLDIRECT in the last 72 hours. Thyroid Function Tests No results for input(s): TSH, T4TOTAL, T3FREE, THYROIDAB in the last 72 hours.  Invalid input(s): FREET3  Other results:   Imaging   No results found.  Medications:     Scheduled Medications: . atorvastatin  80 mg Oral q1800  . diltiazem  120 mg Oral Daily  . enoxaparin (LOVENOX)  injection  60 mg Subcutaneous Q24H  . furosemide  40 mg Intravenous Q12H  . losartan  25 mg Oral Daily  . potassium chloride SA  20 mEq Oral BID  . sodium chloride flush  3 mL Intravenous Q12H  . sodium chloride flush  3 mL Intravenous Q12H  . spironolactone  12.5 mg Oral Daily    Infusions: . sodium chloride    . sodium chloride      PRN Medications: sodium chloride, sodium chloride, acetaminophen, ondansetron (ZOFRAN) IV, sodium chloride flush, sodium chloride flush    Patient Profile   Joel Rogers is a 67 y.o. male with h/o morbid obesity, pulmonary HTN, chronic a fibrillation, sleep apnea, HTN, HLD, chronic venous insufficiency, chronic diastolic CHF, and severe Aortic stenosis.   [facility]HC. Aborted  due to resp distress requiring BiPAP in setting of A/C diastolic CHF and pulmonary hypertension.  Assessment/Plan   1. Acute on chronic diastolic (congestive) heart failure  - Decompensated in cath lab requiring BiPAP and Nitro gtt for hypertensive emergency.  - Volulme status remains elevated - Only marginal response to IV lasix. Will add metolazone 2.5 bid. Continue lasix 80 IV bid - Increase spiro 25 mg daily.  - Continue losartan 25 mg daily for BP.  - plan R/L cath on Monday  2. Acute hypoxic resp distress - Resolved with Bipap and diuresis 3. Hypertensive Emergency - BP much improved. Off NTG.  - Continue current regimen 4. Severe aortic stenosis - Echo March 2019 EF 60% - severe AS, AVA 0.88, peak 76 mmHg, pulmonary HTN- PA 92 mmHg - Echo 07/07/18 EF 65-70% Severe AS Mean gradient (S): 57 mm Hg. Peak(S): 100 mm Hg. RVSP 66mmHG - [facility] will eye toward TAVR 5. Morbid obesity  - Body mass index is 37.49 kg/m.  - h/o gastric bypass. Lost 140 lbs.  6. Obstructive sleep apnea - OSA improved on sleep study 05/16/18, likely with weight loss.  - Has stated he no longer needs CPAP.  7. Chronic atrial fibrillation  - Taken off anticoagulation secondary to SDH after a fall Dec 2017- - Rate much improved this am.  - Reconsider AC  8. Poor dentition - He will need dental consult if TAVR considered.No change.  9. Hypokalemia - supp \  Length of Stay: 2  Joel Meresaniel Caera Enwright, MD  07/08/2018, 11:04 AM  Advanced Heart Failure Team Pager [phone] (M-F; 7a - 4p)  Please contact [facility] for night-coverage after hours (4p -7a ) and weekends on [email or web address]

## 2018-07-09 LAB — BASIC METABOLIC PANEL
ANION GAP: 8 (ref 5–15)
BUN: 14 mg/dL (ref 8–23)
CHLORIDE: 104 mmol/L (ref 98–111)
CO2: 29 mmol/L (ref 22–32)
Calcium: 9.5 mg/dL (ref 8.9–10.3)
Creatinine, Ser: 0.92 mg/dL (ref 0.61–1.24)
GFR calc Af Amer: >60 mL/min (ref >60)
Glucose, Bld: 99 mg/dL (ref 70–99)
POTASSIUM: 3.4 mmol/L — AB (ref 3.5–5.1)
Sodium: 141 mmol/L (ref 135–145)

## 2018-07-09 LAB — CBC WITH DIFFERENTIAL/PLATELET
ABS IMMATURE GRANULOCYTES: 0 10*3/uL (ref 0.0–0.1)
BASOS ABS: 0 10*3/uL (ref 0.0–0.1)
Basophils Relative: 1 %
EOS PCT: 2 %
Eosinophils Absolute: 0.1 10*3/uL (ref 0.0–0.7)
HEMATOCRIT: 32.6 % — AB (ref 39.0–52.0)
HEMOGLOBIN: 10.7 g/dL — AB (ref 13.0–17.0)
Immature Granulocytes: 0 %
LYMPHS ABS: 1.4 10*3/uL (ref 0.7–4.0)
Lymphocytes Relative: 19 %
MCH: 24.2 pg — AB (ref 26.0–34.0)
MCHC: 32.8 g/dL (ref 30.0–36.0)
MCV: 73.8 fL — ABNORMAL LOW (ref 78.0–100.0)
MONO ABS: 1.2 10*3/uL — AB (ref 0.1–1.0)
MONOS PCT: 16 %
NEUTROS ABS: 5 10*3/uL (ref 1.7–7.7)
Neutrophils Relative %: 62 %
Platelets: 192 10*3/uL (ref 150–400)
RBC: 4.42 MIL/uL (ref 4.22–5.81)
RDW: 17.2 % — ABNORMAL HIGH (ref 11.5–15.5)
WBC: 7.8 10*3/uL (ref 4.0–10.5)

## 2018-07-09 LAB — PROTIME-INR
INR: 1.25
Prothrombin Time: 15.6 seconds — ABNORMAL HIGH (ref 11.4–15.2)

## 2018-07-09 MED ORDER — FUROSEMIDE 10 MG/ML IJ SOLN
80.0000 mg | Freq: Two times a day (BID) | INTRAMUSCULAR | Status: DC
  Administered 2018-07-09 – 2018-07-11 (×5): 80 mg via INTRAVENOUS
  Filled 2018-07-09 (×5): qty 8

## 2018-07-09 MED ORDER — SODIUM CHLORIDE 0.9% FLUSH
3.0000 mL | Freq: Two times a day (BID) | INTRAVENOUS | Status: DC
  Administered 2018-07-09: 3 mL via INTRAVENOUS

## 2018-07-09 MED ORDER — POTASSIUM CHLORIDE CRYS ER 20 MEQ PO TBCR
40.0000 meq | EXTENDED_RELEASE_TABLET | Freq: Two times a day (BID) | ORAL | Status: DC
  Administered 2018-07-09 – 2018-07-11 (×5): 40 meq via ORAL
  Filled 2018-07-09 (×5): qty 2

## 2018-07-09 MED ORDER — ASPIRIN 81 MG PO CHEW
81.0000 mg | CHEWABLE_TABLET | ORAL | Status: AC
  Administered 2018-07-10: 81 mg via ORAL
  Filled 2018-07-09: qty 1

## 2018-07-09 MED ORDER — POTASSIUM CHLORIDE CRYS ER 20 MEQ PO TBCR
40.0000 meq | EXTENDED_RELEASE_TABLET | Freq: Once | ORAL | Status: AC
  Administered 2018-07-09: 40 meq via ORAL
  Filled 2018-07-09: qty 2

## 2018-07-09 MED ORDER — SODIUM CHLORIDE 0.9 % IV SOLN: 250.0000 mL | INTRAVENOUS | Status: DC | PRN

## 2018-07-09 MED ORDER — SODIUM CHLORIDE 0.9 % IV SOLN
INTRAVENOUS | Status: DC
  Administered 2018-07-10: 07:00:00 via INTRAVENOUS

## 2018-07-09 MED ORDER — SODIUM CHLORIDE 0.9% FLUSH: 3.0000 mL | INTRAVENOUS | Status: DC | PRN

## 2018-07-09 MED ORDER — METOLAZONE 2.5 MG PO TABS
2.5000 mg | ORAL_TABLET | Freq: Two times a day (BID) | ORAL | Status: DC
  Administered 2018-07-10 – 2018-07-11 (×4): 2.5 mg via ORAL
  Filled 2018-07-09 (×4): qty 1

## 2018-07-09 NOTE — Progress Notes (Addendum)
Advanced Heart Failure Rounding Note  PCP-Cardiologist: No primary care provider on file.   Subjective:    Lasix dose increased yesterday. Metolazone added. I/O seem inaccurate. Recorded weight unchanged. But I reweighed personally. Weight down 10 pounds. Weight 276->266.5  Says he has been peeing all night. Breathing better. Denies orthopnea or PND. No dizziness.   Remains in AF. Rate slightly elevated today   Objective:   Weight Range: 125.3 kg Body mass index is 37.46 kg/m.   Vital Signs:   Temp:  [97.6 F (36.4 C)-98.7 F (37.1 C)] 97.9 F (36.6 C) (08/18 0750) Pulse Rate:  [75-85] 79 (08/18 0750) Resp:  [14-20] 14 (08/18 0750) BP: (106-149)/(72-87) 149/82 (08/18 0901) SpO2:  [96 %-98 %] 96 % (08/18 0029) Weight:  [125.3 kg] 125.3 kg (08/18 0500) Last BM Date: 07/08/18  Weight change: Filed Weights   07/07/18 0538 07/08/18 0632 07/09/18 0500  Weight: 124.5 kg 125.4 kg 125.3 kg    Intake/Output:   Intake/Output Summary (Last 24 hours) at 07/09/2018 1023 Last data filed at 07/09/2018 0900 Gross per 24 hour  Intake 920 ml  Output 1050 ml  Net -130 ml      Physical Exam    General:  Sitting up in chair. No resp difficulty HEENT: normal Neck: supple. JVP 10  Carotids 2+ bilat; + radiated bruits. No lymphadenopathy or thryomegaly appreciated. Cor: PMI nondisplaced. Irregular rate & rhythm. 2/6 AS diminished s2 Lungs: clear Abdomen: obese soft, nontender, nondistended. No hepatosplenomegaly. No bruits or masses. Good bowel sounds. Extremities: no cyanosis, clubbing, rash, 1+ edema   Neuro: alert & orientedx3, cranial nerves grossly intact. moves all 4 extremities w/o difficulty. Affect pleasant   Telemetry   Afib 70-90s, personally reviewed.   EKG    No new tracings.    Labs    CBC Recent Labs    07/06/18 1810 07/07/18 0239 07/09/18 0227  WBC 7.5 8.1 7.8  NEUTROABS 5.5  --  5.0  HGB 11.1* 10.8* 10.7*  HCT 34.0* 32.8* 32.6*  MCV 74.6*  74.4* 73.8*  PLT 218 220 192   Basic Metabolic Panel Recent Labs    54/07/8107/15/19 1810  07/08/18 0258 07/09/18 0227  NA 142   < > 140 141  K 3.4*   < > 3.5 3.4*  CL 105   < > 104 104  CO2 25   < > 27 29  GLUCOSE 96   < > 104* 99  BUN 12   < > 15 14  CREATININE 0.94   < > 0.97 0.92  CALCIUM 9.1   < > 8.9 9.5  MG 2.0  --   --   --    < > = values in this interval not displayed.   Liver Function Tests Recent Labs    07/06/18 1810  AST 27  ALT 17  ALKPHOS 137*  BILITOT 3.2*  PROT 7.2  ALBUMIN 3.3*   No results for input(s): LIPASE, AMYLASE in the last 72 hours. Cardiac Enzymes No results for input(s): CKTOTAL, CKMB, CKMBINDEX, TROPONINI in the last 72 hours.  BNP: BNP (last 3 results) Recent Labs    06/10/18 2322 07/06/18 1810  BNP 685.0* 654.2*    ProBNP (last 3 results) No results for input(s): PROBNP in the last 8760 hours.   D-Dimer No results for input(s): DDIMER in the last 72 hours. Hemoglobin A1C No results for input(s): HGBA1C in the last 72 hours. Fasting Lipid Panel No results for input(s): CHOL, HDL, LDLCALC,  TRIG, CHOLHDL, LDLDIRECT in the last 72 hours. Thyroid Function Tests No results for input(s): TSH, T4TOTAL, T3FREE, THYROIDAB in the last 72 hours.  Invalid input(s): FREET3  Other results:   Imaging   No results found.  Medications:     Scheduled Medications: . atorvastatin  80 mg Oral q1800  . diltiazem  120 mg Oral Daily  . enoxaparin (LOVENOX) injection  60 mg Subcutaneous Q24H  . furosemide  40 mg Intravenous Q12H  . losartan  25 mg Oral Daily  . metolazone  2.5 mg Oral BID  . potassium chloride SA  20 mEq Oral BID  . potassium chloride  40 mEq Oral Once  . sodium chloride flush  3 mL Intravenous Q12H  . sodium chloride flush  3 mL Intravenous Q12H  . spironolactone  25 mg Oral Daily    Infusions: . sodium chloride    . sodium chloride      PRN Medications: sodium chloride, sodium chloride, acetaminophen,  ondansetron (ZOFRAN) IV, sodium chloride flush, sodium chloride flush    Patient Profile   Claudie LeachRalph L Corter is a 67 y.o. male with h/o morbid obesity, pulmonary HTN, chronic a fibrillation, sleep apnea, HTN, HLD, chronic venous insufficiency, chronic diastolic CHF, and severe Aortic stenosis.   [facility]. Aborted due to resp distress requiring BiPAP in setting of A/C diastolic CHF and pulmonary hypertension.  Assessment/Plan   1. Acute on chronic diastolic (congestive) heart failure  - Decompensated in cath lab 07/06/18 requiring BiPAP and Nitro gtt for hypertensive emergency.  - Metolazone added yesterday. Weight down 10 pounds. Will continue current regimen. - Continue spiro 25 mg daily.  - Continue losartan 25 mg daily for BP.  - plan R/L cath tomorrow am 2. Acute hypoxic resp distress - Resolved with Bipap and diuresis 3. Hypertensive Emergency - BP going back up. Increase losartan 4. Severe aortic stenosis - Echo March 2019 EF 60% - severe AS, AVA 0.88, peak 76 mmHg, pulmonary HTN- PA 92 mmHg - Echo 07/07/18 EF 65-70% Severe AS Mean gradient (S): 57 mm Hg. Peak(S): 100 mm Hg. RVSP 66mmHG - [facility] witheye toward T[facility]R 5. Morbid obesity  - Body mass index is 37.46 kg/m.  - h/o gastric bypass. Lost 140 lbs.  6. Obstructive sleep apnea - OSA improved on sleep study 05/16/18, likely with weight loss.  - Has stated he no longer needs CPAP.  7. Chronic atrial fibrillation  - Taken off anticoagulation secondary to SDH after a fall Dec 2017- - Rate slightly elevated in setting of massive diuresis. Wil lfollow  - Reconsider AC  8. Poor dentition - He will need dental consult if TAVR considered.No change.  9. Hypokalemia - supp   Length of Stay: 3  Arvilla Meresaniel Bensimhon, MD  07/09/2018, 10:23 AM  Advanced Heart Failure Team Pager [phone] (M-F; 7a - 4p)  Please contact [facility] for night-coverage after hours (4p -7a ) and weekends on  [email or web address]

## 2018-07-09 NOTE — H&P (View-Only) (Signed)
Advanced Heart Failure Rounding Note  PCP-Cardiologist: No primary care provider on file.   Subjective:    Lasix dose increased yesterday. Metolazone added. I/O seem inaccurate. Recorded weight unchanged. But I reweighed personally. Weight down 10 pounds. Weight 276->266.5  Says he has been peeing all night. Breathing better. Denies orthopnea or PND. No dizziness.   Remains in AF. Rate slightly elevated today   Objective:   Weight Range: 125.3 kg Body mass index is 37.46 kg/m.   Vital Signs:   Temp:  [97.6 F (36.4 C)-98.7 F (37.1 C)] 97.9 F (36.6 C) (08/18 0750) Pulse Rate:  [75-85] 79 (08/18 0750) Resp:  [14-20] 14 (08/18 0750) BP: (106-149)/(72-87) 149/82 (08/18 0901) SpO2:  [96 %-98 %] 96 % (08/18 0029) Weight:  [125.3 kg] 125.3 kg (08/18 0500) Last BM Date: 07/08/18  Weight change: Filed Weights   07/07/18 0538 07/08/18 0632 07/09/18 0500  Weight: 124.5 kg 125.4 kg 125.3 kg    Intake/Output:   Intake/Output Summary (Last 24 hours) at 07/09/2018 1023 Last data filed at 07/09/2018 0900 Gross per 24 hour  Intake 920 ml  Output 1050 ml  Net -130 ml      Physical Exam    General:  Sitting up in chair. No resp difficulty HEENT: normal Neck: supple. JVP 10  Carotids 2+ bilat; + radiated bruits. No lymphadenopathy or thryomegaly appreciated. Cor: PMI nondisplaced. Irregular rate & rhythm. 2/6 AS diminished s2 Lungs: clear Abdomen: obese soft, nontender, nondistended. No hepatosplenomegaly. No bruits or masses. Good bowel sounds. Extremities: no cyanosis, clubbing, rash, 1+ edema   Neuro: alert & orientedx3, cranial nerves grossly intact. moves all 4 extremities w/o difficulty. Affect pleasant   Telemetry   Afib 70-90s, personally reviewed.   EKG    No new tracings.    Labs    CBC Recent Labs    07/06/18 1810 07/07/18 0239 07/09/18 0227  WBC 7.5 8.1 7.8  NEUTROABS 5.5  --  5.0  HGB 11.1* 10.8* 10.7*  HCT 34.0* 32.8* 32.6*  MCV 74.6*  74.4* 73.8*  PLT 218 220 192   Basic Metabolic Panel Recent Labs    54/07/8107/15/19 1810  07/08/18 0258 07/09/18 0227  NA 142   < > 140 141  K 3.4*   < > 3.5 3.4*  CL 105   < > 104 104  CO2 25   < > 27 29  GLUCOSE 96   < > 104* 99  BUN 12   < > 15 14  CREATININE 0.94   < > 0.97 0.92  CALCIUM 9.1   < > 8.9 9.5  MG 2.0  --   --   --    < > = values in this interval not displayed.   Liver Function Tests Recent Labs    07/06/18 1810  AST 27  ALT 17  ALKPHOS 137*  BILITOT 3.2*  PROT 7.2  ALBUMIN 3.3*   No results for input(s): LIPASE, AMYLASE in the last 72 hours. Cardiac Enzymes No results for input(s): CKTOTAL, CKMB, CKMBINDEX, TROPONINI in the last 72 hours.  BNP: BNP (last 3 results) Recent Labs    06/10/18 2322 07/06/18 1810  BNP 685.0* 654.2*    ProBNP (last 3 results) No results for input(s): PROBNP in the last 8760 hours.   D-Dimer No results for input(s): DDIMER in the last 72 hours. Hemoglobin A1C No results for input(s): HGBA1C in the last 72 hours. Fasting Lipid Panel No results for input(s): CHOL, HDL, LDLCALC,  TRIG, CHOLHDL, LDLDIRECT in the last 72 hours. Thyroid Function Tests No results for input(s): TSH, T4TOTAL, T3FREE, THYROIDAB in the last 72 hours.  Invalid input(s): FREET3  Other results:   Imaging   No results found.  Medications:     Scheduled Medications: . atorvastatin  80 mg Oral q1800  . diltiazem  120 mg Oral Daily  . enoxaparin (LOVENOX) injection  60 mg Subcutaneous Q24H  . furosemide  40 mg Intravenous Q12H  . losartan  25 mg Oral Daily  . metolazone  2.5 mg Oral BID  . potassium chloride SA  20 mEq Oral BID  . potassium chloride  40 mEq Oral Once  . sodium chloride flush  3 mL Intravenous Q12H  . sodium chloride flush  3 mL Intravenous Q12H  . spironolactone  25 mg Oral Daily    Infusions: . sodium chloride    . sodium chloride      PRN Medications: sodium chloride, sodium chloride, acetaminophen,  ondansetron (ZOFRAN) IV, sodium chloride flush, sodium chloride flush    Patient Profile   Joel Rogers is a 67 y.o. male with h/o morbid obesity, pulmonary HTN, chronic a fibrillation, sleep apnea, HTN, HLD, chronic venous insufficiency, chronic diastolic CHF, and severe Aortic stenosis.   Presented [facility] 07/[facility] for L/RHC. Aborted due to resp distress requiring BiPAP in setting of A/C diastolic CHF and pulmonary hypertension.  Assessment/Plan   1. Acute on chronic diastolic (congestive) heart failure  - Decompensated in cath lab 07/06/18 requiring BiPAP and Nitro gtt for hypertensive emergency.  - Metolazone added yesterday. Weight down 10 pounds. Will continue current regimen. - Continue spiro 25 mg daily.  - Continue losartan 25 mg daily for BP.  - plan R/L cath tomorrow am 2. Acute hypoxic resp distress - Resolved with Bipap and diuresis 3. Hypertensive Emergency - BP going back up. Increase losartan 4. Severe aortic stenosis - Echo March 2019 EF 60% - severe AS, AVA 0.88, peak 76 mmHg, pulmonary HTN- PA 92 mmHg - Echo 07/07/18 EF 65-70% Severe AS Mean gradient (S): 57 mm Hg. Peak(S): 100 mm Hg. RVSP 66mmHG - Plan R/L HC tomorrow witheye toward TAVR 5. Morbid obesity  - Body mass index is 37.46 kg/m.  - h/o gastric bypass. Lost 140 lbs.  6. Obstructive sleep apnea - OSA improved on sleep study 05/16/18, likely with weight loss.  - Has stated he no longer needs CPAP.  7. Chronic atrial fibrillation  - Taken off anticoagulation secondary to SDH after a fall Dec 2017- - Rate slightly elevated in setting of massive diuresis. Wil lfollow  - Reconsider AC  8. Poor dentition - He will need dental consult if TAVR considered.No change.  9. Hypokalemia - supp   Length of Stay: 3  Kessler Kopinski, MD  07/09/2018, 10:23 AM  Advanced Heart Failure Team Pager [phone] (M-F; 7a - 4p)  Please contact [facility] for night-coverage after hours (4p -7a ) and weekends on  [email or web address]      

## 2018-07-10 ENCOUNTER — Encounter (HOSPITAL_COMMUNITY): Payer: Self-pay | Admitting: Internal Medicine

## 2018-07-10 ENCOUNTER — Inpatient Hospital Stay (HOSPITAL_COMMUNITY): Payer: Medicare Other

## 2018-07-10 ENCOUNTER — Encounter (HOSPITAL_COMMUNITY)
Admission: AD | Disposition: A | Discharge status: Home / Self Care | Payer: Self-pay | Source: Ambulatory Visit | Attending: Internal Medicine

## 2018-07-10 DIAGNOSIS — Z01810 Encounter for preprocedural cardiovascular examination: Secondary | ICD-10-CM

## 2018-07-10 HISTORY — PX: RIGHT/LEFT HEART CATH AND CORONARY ANGIOGRAPHY: CATH118266

## 2018-07-10 LAB — BASIC METABOLIC PANEL
Anion gap: 12 (ref 5–15)
BUN: 15 mg/dL (ref 8–23)
CALCIUM: 9.7 mg/dL (ref 8.9–10.3)
CO2: 28 mmol/L (ref 22–32)
Chloride: 102 mmol/L (ref 98–111)
Creatinine, Ser: 0.97 mg/dL (ref 0.61–1.24)
GFR calc Af Amer: >60 mL/min (ref >60)
Glucose, Bld: 96 mg/dL (ref 70–99)
Potassium: 4.2 mmol/L (ref 3.5–5.1)
SODIUM: 142 mmol/L (ref 135–145)

## 2018-07-10 LAB — POCT I-STAT 3, VENOUS BLOOD GAS (G3P V)
ACID-BASE EXCESS: 4 mmol/L — AB (ref 0.0–2.0)
Acid-Base Excess: 6 mmol/L — ABNORMAL HIGH (ref 0.0–2.0)
BICARBONATE: 29.3 mmol/L — AB (ref 20.0–28.0)
BICARBONATE: 31.3 mmol/L — AB (ref 20.0–28.0)
O2 SAT: 78 %
O2 SAT: 79 %
PCO2 VEN: 46.5 mmHg (ref 44.0–60.0)
PH VEN: 7.407 (ref 7.250–7.430)
PO2 VEN: 43 mmHg (ref 32.0–45.0)
TCO2: 33 mmol/L — AB (ref 22–32)
TCO2: 31 mmol/L (ref 22–32)
pCO2, Ven: 49.7 mmHg (ref 44.0–60.0)
pH, Ven: 7.408 (ref 7.250–7.430)
pO2, Ven: 44 mmHg (ref 32.0–45.0)

## 2018-07-10 LAB — POCT I-STAT 3, ART BLOOD GAS (G3+)
Acid-Base Excess: 4 mmol/L — ABNORMAL HIGH (ref 0.0–2.0)
BICARBONATE: 29.3 mmol/L — AB (ref 20.0–28.0)
O2 Saturation: 99 %
PCO2 ART: 45 mmHg (ref 32.0–48.0)
PH ART: 7.422 (ref 7.350–7.450)
PO2 ART: 117 mmHg — AB (ref 83.0–108.0)
TCO2: 31 mmol/L (ref 22–32)

## 2018-07-10 SURGERY — RIGHT/LEFT HEART CATH AND CORONARY ANGIOGRAPHY
Anesthesia: LOCAL

## 2018-07-10 MED ORDER — HEPARIN (PORCINE) IN NACL 1000-0.9 UT/500ML-% IV SOLN
INTRAVENOUS | Status: AC
  Filled 2018-07-10: qty 1000

## 2018-07-10 MED ORDER — ACETAMINOPHEN 325 MG PO TABS: 650.0000 mg | ORAL_TABLET | ORAL | Status: DC | PRN

## 2018-07-10 MED ORDER — HEPARIN (PORCINE) IN NACL 2-0.9 UNITS/ML
INTRAMUSCULAR | Status: DC | PRN
  Administered 2018-07-10: 10 mL via INTRA_ARTERIAL

## 2018-07-10 MED ORDER — LIDOCAINE HCL (PF) 1 % IJ SOLN
INTRAMUSCULAR | Status: DC | PRN
  Administered 2018-07-10 (×2): 2 mL via INTRADERMAL

## 2018-07-10 MED ORDER — SODIUM CHLORIDE 0.9% FLUSH
3.0000 mL | Freq: Two times a day (BID) | INTRAVENOUS | Status: DC
  Administered 2018-07-10 – 2018-07-15 (×8): 3 mL via INTRAVENOUS

## 2018-07-10 MED ORDER — VERAPAMIL HCL 2.5 MG/ML IV SOLN
INTRAVENOUS | Status: AC
  Filled 2018-07-10: qty 2

## 2018-07-10 MED ORDER — FENTANYL CITRATE (PF) 100 MCG/2ML IJ SOLN
INTRAMUSCULAR | Status: DC | PRN
  Administered 2018-07-10: 25 ug via INTRAVENOUS

## 2018-07-10 MED ORDER — SODIUM CHLORIDE 0.9% FLUSH: 3.0000 mL | INTRAVENOUS | Status: DC | PRN

## 2018-07-10 MED ORDER — HEPARIN SODIUM (PORCINE) 1000 UNIT/ML IJ SOLN
INTRAMUSCULAR | Status: DC | PRN
  Administered 2018-07-10: 5000 [IU] via INTRAVENOUS

## 2018-07-10 MED ORDER — SODIUM CHLORIDE 0.9 % IV SOLN: 250.0000 mL | INTRAVENOUS | Status: DC | PRN

## 2018-07-10 MED ORDER — HEPARIN SODIUM (PORCINE) 1000 UNIT/ML IJ SOLN
INTRAMUSCULAR | Status: AC
  Filled 2018-07-10: qty 1

## 2018-07-10 MED ORDER — SODIUM CHLORIDE 0.9 % IV SOLN
INTRAVENOUS | Status: AC
  Administered 2018-07-10: 09:00:00 via INTRAVENOUS

## 2018-07-10 MED ORDER — ONDANSETRON HCL 4 MG/2ML IJ SOLN: 4.0000 mg | Freq: Four times a day (QID) | INTRAMUSCULAR | Status: DC | PRN

## 2018-07-10 MED ORDER — LIDOCAINE HCL (PF) 1 % IJ SOLN
INTRAMUSCULAR | Status: AC
  Filled 2018-07-10: qty 30

## 2018-07-10 MED ORDER — FENTANYL CITRATE (PF) 100 MCG/2ML IJ SOLN
INTRAMUSCULAR | Status: AC
  Filled 2018-07-10: qty 2

## 2018-07-10 MED ORDER — IOHEXOL 350 MG/ML SOLN
INTRAVENOUS | Status: DC | PRN
  Administered 2018-07-10: 50 mL via INTRA_ARTERIAL

## 2018-07-10 MED ORDER — HEPARIN (PORCINE) IN NACL 1000-0.9 UT/500ML-% IV SOLN
INTRAVENOUS | Status: DC | PRN
  Administered 2018-07-10 (×2): 500 mL

## 2018-07-10 MED ORDER — MIDAZOLAM HCL 2 MG/2ML IJ SOLN
INTRAMUSCULAR | Status: DC | PRN
  Administered 2018-07-10 (×2): 1 mg via INTRAVENOUS

## 2018-07-10 MED ORDER — MIDAZOLAM HCL 2 MG/2ML IJ SOLN
INTRAMUSCULAR | Status: AC
  Filled 2018-07-10: qty 2

## 2018-07-10 SURGICAL SUPPLY — 14 items
CATH 5FR JL3.5 JR4 ANG PIG MP (CATHETERS) ×2 IMPLANT
CATH BALLN WEDGE 5F 110CM (CATHETERS) ×2 IMPLANT
DEVICE RAD COMP TR BAND LRG (VASCULAR PRODUCTS) ×2 IMPLANT
GLIDESHEATH SLEND SS 6F .021 (SHEATH) ×2 IMPLANT
GUIDEWIRE INQWIRE 1.5J.035X260 (WIRE) ×1 IMPLANT
HOVERMATT SINGLE USE (MISCELLANEOUS) ×2 IMPLANT
INQWIRE 1.5J .035X260CM (WIRE) ×2
KIT HEART LEFT (KITS) ×2 IMPLANT
KIT MICROPUNCTURE NIT STIFF (SHEATH) ×2 IMPLANT
PACK CARDIAC CATHETERIZATION (CUSTOM PROCEDURE TRAY) ×2 IMPLANT
SHEATH GLIDE SLENDER 4/5FR (SHEATH) ×2 IMPLANT
SHEATH PROBE COVER 6X72 (BAG) ×2 IMPLANT
TRANSDUCER W/STOPCOCK (MISCELLANEOUS) ×2 IMPLANT
TUBING CIL FLEX 10 FLL-RA (TUBING) ×2 IMPLANT

## 2018-07-10 NOTE — Progress Notes (Signed)
Bilateral Carotid duplex completed - Preliminary results - 1%to39% ICA stenosis bilaterally. Vertebral artery flow is antegrade. [place] D Bridgit Eynon, RVS 07/10/2018 3:21 PM

## 2018-07-10 NOTE — Progress Notes (Signed)
Advanced Heart Failure Rounding Note  PCP-Cardiologist: No primary care provider on file.   Subjective:    Continues on IV lasix. Weight down another 9 pounds last night. Breathing much better. No orthopnea or PND. Bloating much better.   Objective:   Weight Range: 116.9 kg Body mass index is 34.96 kg/m.   Vital Signs:   Temp:  [97.9 F (36.6 C)-99 F (37.2 C)] 98.6 F (37 C) (08/19 0709) Pulse Rate:  [68-83] 80 (08/19 0709) Resp:  [14-21] 16 (08/19 0709) BP: (100-149)/(66-91) 121/76 (08/19 0709) SpO2:  [96 %-100 %] 99 % (08/19 0730) Weight:  [116.9 kg-120.9 kg] 116.9 kg (08/19 0500) Last BM Date: 07/09/18  Weight change: Filed Weights   07/09/18 0500 07/09/18 1000 07/10/18 0500  Weight: 125.3 kg 120.9 kg 116.9 kg    Intake/Output:   Intake/Output Summary (Last 24 hours) at 07/10/2018 0748 Last data filed at 07/10/2018 0709 Gross per 24 hour  Intake 485.77 ml  Output 2250 ml  Net -1764.23 ml      Physical Exam    General:  Well appearing. No resp difficulty HEENT: normal Neck: supple. JVP 8-9. Carotids 2+ bilat; radiated bruits. No lymphadenopathy or thryomegaly appreciated. Cor: PMI nondisplaced. Irregular rate & rhythm. 2/6 AS Lungs: clear Abdomen: obese soft, nontender, nondistended. No hepatosplenomegaly. No bruits or masses. Good bowel sounds. Extremities: no cyanosis, clubbing, rash, trace edema Neuro: alert & orientedx3, cranial nerves grossly intact. moves all 4 extremities w/o difficulty. Affect pleasant   Telemetry   Afib 70-90s, personally reviewed.   EKG    No new tracings.    Labs    CBC Recent Labs    07/09/18 0227  WBC 7.8  NEUTROABS 5.0  HGB 10.7*  HCT 32.6*  MCV 73.8*  PLT 192   Basic Metabolic Panel Recent Labs    22/12/5406/18/19 0227 07/10/18 0316  NA 141 142  K 3.4* 4.2  CL 104 102  CO2 29 28  GLUCOSE 99 96  BUN 14 15  CREATININE 0.92 0.97  CALCIUM 9.5 9.7   Liver Function Tests No results for input(s): AST,  ALT, ALKPHOS, BILITOT, PROT, ALBUMIN in the last 72 hours. No results for input(s): LIPASE, AMYLASE in the last 72 hours. Cardiac Enzymes No results for input(s): CKTOTAL, CKMB, CKMBINDEX, TROPONINI in the last 72 hours.  BNP: BNP (last 3 results) Recent Labs    06/10/18 2322 07/06/18 1810  BNP 685.0* 654.2*    ProBNP (last 3 results) No results for input(s): PROBNP in the last 8760 hours.   D-Dimer No results for input(s): DDIMER in the last 72 hours. Hemoglobin A1C No results for input(s): HGBA1C in the last 72 hours. Fasting Lipid Panel No results for input(s): CHOL, HDL, LDLCALC, TRIG, CHOLHDL, LDLDIRECT in the last 72 hours. Thyroid Function Tests No results for input(s): TSH, T4TOTAL, T3FREE, THYROIDAB in the last 72 hours.  Invalid input(s): FREET3  Other results:   Imaging   No results found.  Medications:     Scheduled Medications: . [MAR Hold] atorvastatin  80 mg Oral q1800  . [MAR Hold] diltiazem  120 mg Oral Daily  . [MAR Hold] enoxaparin (LOVENOX) injection  60 mg Subcutaneous Q24H  . [MAR Hold] furosemide  80 mg Intravenous Q12H  . [MAR Hold] losartan  25 mg Oral Daily  . [MAR Hold] metolazone  2.5 mg Oral BID  . [MAR Hold] potassium chloride SA  40 mEq Oral BID  . [MAR Hold] sodium chloride flush  3  mL Intravenous Q12H  . [MAR Hold] sodium chloride flush  3 mL Intravenous Q12H  . sodium chloride flush  3 mL Intravenous Q12H  . [MAR Hold] spironolactone  25 mg Oral Daily    Infusions: . [MAR Hold] sodium chloride    . [MAR Hold] sodium chloride    . sodium chloride    . sodium chloride 10 mL/hr at 07/10/18 0709    PRN Medications: [MAR Hold] sodium chloride, [MAR Hold] sodium chloride, sodium chloride, [MAR Hold] acetaminophen, [MAR Hold] ondansetron (ZOFRAN) IV, [MAR Hold] sodium chloride flush, [MAR Hold] sodium chloride flush, sodium chloride flush    Patient Profile   Joel Rogers is a 67 y.o. male with h/o morbid obesity,  pulmonary HTN, chronic a fibrillation, sleep apnea, HTN, HLD, chronic venous insufficiency, chronic diastolic CHF, and severe Aortic stenosis.   [facility]. Aborted due to resp distress requiring BiPAP in setting of A/C diastolic CHF and pulmonary hypertension.  Assessment/Plan   1. Acute on chronic diastolic (congestive) heart failure  - Decompensated in cath lab 07/06/18 requiring BiPAP and Nitro gtt for hypertensive emergency.  - has diureses 21 pounds. Much improved - Continue spiro 25 mg daily.  - Continue losartan 25 mg daily for BP.  - plan R/L cath today 2. Acute hypoxic resp distress - Resolved with Bipap and diuresis 3. Hypertensive Emergency - BP improved with increased losartan and diuresis 4. Severe aortic stenosis - Echo March 2019 EF 60% - severe AS, AVA 0.88, peak 76 mmHg, pulmonary HTN- PA 92 mmHg - Echo 07/07/18 EF 65-70% Severe AS Mean gradient (S): 57 mm Hg. Peak(S): 100 mm Hg. RVSP 66mmHG - [facility]he[facility] T[facility] 5. Morbid obesity  - Body mass index is 34.96 kg/m.  - h/o gastric bypass. Lost 140 lbs.  6. Obstructive sleep apnea - OSA improved on sleep study 05/16/18, likely with weight loss.  - Has stated he no longer needs CPAP.  7. Chronic atrial fibrillation  - Taken off anticoagulation secondary to SDH after a fall Dec 2017- - Rate slightly elevated in setting of massive diuresis. Wil lfollow  - Reconsider AC  8. Poor dentition - He will need dental consult if TAVR considered.No change.  9. Hypokalemia - k 4.2 today   Length of Stay: 4  Arvilla Meresaniel Bensimhon, MD  07/10/2018, 7:48 AM  Advanced Heart Failure Team Pager [phone] (M-F; 7a - 4p)  Please contact [facility] for night-coverage after hours (4p -7a ) and weekends on [email or web address]

## 2018-07-10 NOTE — Interval H&P Note (Signed)
History and Physical Interval Note:  07/10/2018 7:47 AM  Joel Rogers  has presented today for surgery, with the diagnosis of chf  The various methods of treatment have been discussed with the patient and family. After consideration of risks, benefits and other options for treatment, the patient has consented to  Procedure(s): RIGHT/LEFT HEART CATH AND CORONARY ANGIOGRAPHY (N/A) and possible coronary angioplasty as a surgical intervention .  The patient's history has been reviewed, patient examined, no change in status, stable for surgery.  I have reviewed the patient's chart and labs.  Questions were answered to the patient's satisfaction.     Daniel Bensimhon

## 2018-07-10 NOTE — Plan of Care (Signed)
  Problem: Health Behavior/Discharge Planning: Goal: Ability to manage health-related needs will improve Outcome: Progressing   Problem: Clinical Measurements: Goal: Ability to maintain clinical measurements within normal limits will improve Outcome: Progressing Goal: Will remain free from infection Outcome: Progressing Goal: Diagnostic test results will improve Outcome: Progressing Goal: Respiratory complications will improve Outcome: Progressing Goal: Cardiovascular complication will be avoided Outcome: Progressing   Problem: Coping: Goal: Level of anxiety will decrease Outcome: Progressing   Problem: Safety: Goal: Ability to remain free from injury will improve Outcome: Progressing   Problem: Skin Integrity: Goal: Risk for impaired skin integrity will decrease Outcome: Progressing   

## 2018-07-10 NOTE — Consult Note (Signed)
[facility] CM Primary Care Navigator  07/10/2018  Joel Rogers Jan 24, 1951 [id number]   Met withpatientat the bedsideto identify possible discharge needs. Patient reports having "shortness of breath, lower extremity edema and easily getting tired" that had led to this admission. (Acute on chronic diastolic (congestive) heart failure, Acute hypoxic respiratory distress, Severe aortic stenosis)  PatientendorsesDr. Dena Billet with [place] care provider.   [facility] [place] [facility].   Patientstatesthathehas been managinghismedications at [employer] use of "pill box" system filled weekly.  Patient reports that he has been driving prior to admission but hiswife([name]) willbe able toprovidetransportation tohisdoctors' appointmentsafter discharge.  Patientliveswithwife whowill be hisprimary caregiverat home.   Anticipated discharge plan ishomeper patient.  Patientvoiced understanding to call primary care provider's office whenhereturns home for a post discharge follow-up appointment within1- 2 weeksor sooner if needs arise.Patient letter (with PCP's contact number) was provided ashis reminder.  Explained topatientregardingTHN CM services available for health managementand resourcesat home.   Per chart review, patienthad increasing dyspnea on exertion and diuretics increased (Lasix IV Lasix 80 mg BID). Patient had a significant amount of fluid, likely 20-30 pounds. He admits need to change lifestyle with his health conditions. His BiPAP was started with symptomatic relief and continued until BP had better control.  He mentioned having a weighing scale at home and monitoring/ recording daily weights. He tries to follow low sodium diet and takes medications as ordered. He used to exercise at the [facility] and plans to  go back when fully recovered.  Patientopted and verbally agreed withEMMI HF callsto follow-up with hisrecovery at home.  Referral was made for EMMI HFcalls after discharge.  Patientexpressed understanding to seekref[facility] to [facility] care managementfrom primary care provider if deemed necessary forfurtherservicesin the future.   [facility] care management information provided for future needs that may arise.    For additional questions please contact:  Edwena Felty A. Alaya Iverson, BSN, RN-BC [facility] PRIMARY CARE Navigator Cell: [phone]

## 2018-07-11 ENCOUNTER — Inpatient Hospital Stay (HOSPITAL_COMMUNITY): Payer: Medicare Other

## 2018-07-11 DIAGNOSIS — K036 Deposits [accretions] on teeth: Secondary | ICD-10-CM

## 2018-07-11 DIAGNOSIS — K08409 Partial loss of teeth, unspecified cause, unspecified class: Secondary | ICD-10-CM

## 2018-07-11 DIAGNOSIS — K06010 Localized gingival recession, unspecified: Secondary | ICD-10-CM

## 2018-07-11 DIAGNOSIS — K029 Dental caries, unspecified: Secondary | ICD-10-CM

## 2018-07-11 DIAGNOSIS — K0530 Chronic periodontitis, unspecified: Secondary | ICD-10-CM

## 2018-07-11 DIAGNOSIS — I350 Nonrheumatic aortic (valve) stenosis: Secondary | ICD-10-CM

## 2018-07-11 DIAGNOSIS — M264 Malocclusion, unspecified: Secondary | ICD-10-CM

## 2018-07-11 DIAGNOSIS — K0889 Other specified disorders of teeth and supporting structures: Secondary | ICD-10-CM

## 2018-07-11 DIAGNOSIS — K045 Chronic apical periodontitis: Secondary | ICD-10-CM

## 2018-07-11 LAB — PULMONARY FUNCTION TEST
DL/VA % PRED: 93 %
DL/VA: 4.4 ml/min/mmHg/L
DLCO UNC: 17.47 ml/min/mmHg
DLCO unc % pred: 49 %
FEF 25-75 POST: 1.28 L/s
FEF 25-75 Pre: 0.67 L/sec
FEF2575-%CHANGE-POST: 91 %
FEF2575-%PRED-POST: 45 %
FEF2575-%Pred-Pre: 23 %
FEV1-%CHANGE-POST: 18 %
FEV1-%PRED-PRE: 45 %
FEV1-%Pred-Post: 53 %
FEV1-Post: 1.72 L
FEV1-Pre: 1.45 L
FEV1FVC-%CHANGE-POST: 11 %
FEV1FVC-%Pred-Pre: 81 %
FEV6-%Change-Post: 9 %
FEV6-%Pred-Post: 60 %
FEV6-%Pred-Pre: 55 %
FEV6-PRE: 2.23 L
FEV6-Post: 2.44 L
FEV6FVC-%CHANGE-POST: 2 %
FEV6FVC-%Pred-Post: 103 %
FEV6FVC-%Pred-Pre: 101 %
FVC-%CHANGE-POST: 6 %
FVC-%PRED-POST: 58 %
FVC-%PRED-PRE: 54 %
FVC-PRE: 2.3 L
FVC-Post: 2.46 L
POST FEV1/FVC RATIO: 70 %
PRE FEV6/FVC RATIO: 97 %
Post FEV6/FVC ratio: 99 %
Pre FEV1/FVC ratio: 63 %
RV % pred: 133 %
RV: 3.35 L
TLC % pred: 78 %
TLC: 5.79 L

## 2018-07-11 LAB — BASIC METABOLIC PANEL
ANION GAP: 8 (ref 5–15)
BUN: 17 mg/dL (ref 8–23)
CALCIUM: 9.7 mg/dL (ref 8.9–10.3)
CO2: 34 mmol/L — ABNORMAL HIGH (ref 22–32)
Chloride: 96 mmol/L — ABNORMAL LOW (ref 98–111)
Creatinine, Ser: 1.13 mg/dL (ref 0.61–1.24)
Glucose, Bld: 111 mg/dL — ABNORMAL HIGH (ref 70–99)
Potassium: 4 mmol/L (ref 3.5–5.1)
SODIUM: 138 mmol/L (ref 135–145)

## 2018-07-11 MED ORDER — ALBUTEROL SULFATE (2.5 MG/3ML) 0.083% IN NEBU
2.5000 mg | INHALATION_SOLUTION | Freq: Once | RESPIRATORY_TRACT | Status: AC
  Administered 2018-07-11: 2.5 mg via RESPIRATORY_TRACT

## 2018-07-11 MED ORDER — IOPAMIDOL (ISOVUE-370) INJECTION 76%
100.0000 mL | Freq: Once | INTRAVENOUS | Status: AC | PRN
  Administered 2018-07-11: 100 mL via INTRAVENOUS

## 2018-07-11 NOTE — Plan of Care (Signed)
  Problem: Health Behavior/Discharge Planning: Goal: Ability to manage health-related needs will improve Outcome: Progressing   Problem: Clinical Measurements: Goal: Ability to maintain clinical measurements within normal limits will improve Outcome: Progressing Goal: Will remain free from infection Outcome: Progressing Goal: Diagnostic test results will improve Outcome: Progressing Goal: Respiratory complications will improve Outcome: Progressing Goal: Cardiovascular complication will be avoided Outcome: Progressing   Problem: Coping: Goal: Level of anxiety will decrease Outcome: Progressing   Problem: Safety: Goal: Ability to remain free from injury will improve Outcome: Progressing   Problem: Skin Integrity: Goal: Risk for impaired skin integrity will decrease Outcome: Progressing   

## 2018-07-11 NOTE — Care Management Note (Signed)
Case Management Note  Patient Details  Name: Joel Rogers MRN: [id number] Date of Birth: 01-15-1951  Subjective/Objective:   Pt admitted with HF                 Action/Plan:   PTA independent from home with wife.  Pt has PCP and denied barriers with paying for medications.  CM educated pt on importance of daily weights and low salt diet.  CM will continue to follow for discharge needs   Expected Discharge Date:                  Expected Discharge Plan:  Home/Self Care(Independent from home, has PCP and denied barriers with obtaining medications.  )  In-House Referral:     Discharge planning Services  CM Consult  Post Acute Care Choice:    Choice offered to:     DME Arranged:    DME Agency:     HH Arranged:    HH Agency:     Status of Service:     If discussed at [employer] Length of [employer] Meetings, dates discussed:    Additional Comments:  Cherylann ParrClaxton, Sukhman Martine S, RN 07/11/2018, 4:02 PM

## 2018-07-11 NOTE — Progress Notes (Addendum)
Advanced Heart Failure Rounding Note  PCP-Cardiologist: No primary care provider on file.   Subjective:    Weight down another 6 lbs (27 total).   Remains on IV lasix. Creatinine better. Feeling a little better every day. Denies SOB. No lightheadedness or dizziness.    PFTs 07/10/18 - Completed, results pending.  R/LHC 07/10/18 Ao = 118/83 (104) LV = 207/30 RA = 17 RV = 87/13 PA = 91/34 (54) PCW = 28 Fick cardiac output/index = 10.3/4.4 PVR = 2.5 WU FA sat = 99% PA sat = 78%, 79%  Aortic valve: Mean gradient 67 mmHG  Peak gradient 92mmHG  AVA 1.06 cm2  Assessment:  1. Normal coronaries 2. Normal LV function EF 65% 3. Severe aortic stenosis 4. Severe pulmonary venous HTN with high-output state  Objective:   Weight Range: 114.2 kg Body mass index is 34.15 kg/m.   Vital Signs:   Temp:  [98 F (36.7 C)-98.8 F (37.1 C)] 98.8 F (37.1 C) (08/20 0711) Pulse Rate:  [78-100] 80 (08/20 0711) Resp:  [12-22] 22 (08/20 0711) BP: (91-161)/(53-111) 107/73 (08/20 0711) SpO2:  [94 %-100 %] 100 % (08/20 0711) Weight:  [114.2 kg] 114.2 kg (08/20 0300) Last BM Date: 07/09/18  Weight change: Filed Weights   07/09/18 1000 07/10/18 0500 07/11/18 0300  Weight: 120.9 kg 116.9 kg 114.2 kg    Intake/Output:   Intake/Output Summary (Last 24 hours) at 07/11/2018 0736 Last data filed at 07/11/2018 0200 Gross per 24 hour  Intake 1489.28 ml  Output 3720 ml  Net -2230.72 ml      Physical Exam    General: No resp difficulty. HEENT: Normal Neck: Supple. JVP 8-9 cm. Carotids 2+ bilat; no bruits. No thyromegaly or nodule noted. Cor: PMI nondisplaced. Irregular rate and rhythm. 2/6 AS Lungs: CTAB, normal effort. Abdomen: Soft, non-tender, non-distended, no HSM. No bruits or masses. +BS  Extremities: No cyanosis, clubbing, or rash. Trace edema.  Neuro: Alert & orientedx3, cranial nerves grossly intact. moves all 4 extremities w/o difficulty. Affect pleasant    Telemetry   Afib 70-90s, personally reviewed.   EKG    No new tracings.    Labs    CBC Recent Labs    07/09/18 0227  WBC 7.8  NEUTROABS 5.0  HGB 10.7*  HCT 32.6*  MCV 73.8*  PLT 192   Basic Metabolic Panel Recent Labs    09/81/1908/19/19 0316 07/11/18 0232  NA 142 138  K 4.2 4.0  CL 102 96*  CO2 28 34*  GLUCOSE 96 111*  BUN 15 17  CREATININE 0.97 1.13  CALCIUM 9.7 9.7   Liver Function Tests No results for input(s): AST, ALT, ALKPHOS, BILITOT, PROT, ALBUMIN in the last 72 hours. No results for input(s): LIPASE, AMYLASE in the last 72 hours. Cardiac Enzymes No results for input(s): CKTOTAL, CKMB, CKMBINDEX, TROPONINI in the last 72 hours.  BNP: BNP (last 3 results) Recent Labs    06/10/18 2322 07/06/18 1810  BNP 685.0* 654.2*    ProBNP (last 3 results) No results for input(s): PROBNP in the last 8760 hours.   D-Dimer No results for input(s): DDIMER in the last 72 hours. Hemoglobin A1C No results for input(s): HGBA1C in the last 72 hours. Fasting Lipid Panel No results for input(s): CHOL, HDL, LDLCALC, TRIG, CHOLHDL, LDLDIRECT in the last 72 hours. Thyroid Function Tests No results for input(s): TSH, T4TOTAL, T3FREE, THYROIDAB in the last 72 hours.  Invalid input(s): FREET3  Other results:   Imaging  Dg Orthopantogram  Result Date: 07/10/2018 CLINICAL DATA:  Right lower molar pain. EXAM: ORTHOPANTOGRAM/PANORAMIC COMPARISON:  None. FINDINGS: No fracture or dislocation is noted. Poor dentition is noted. Large defect is seen involving right-sided posterior molar in right mandible concerning for dental infection. IMPRESSION: Large defect seen involving right-sided posterior molar in right mandible concerning for dental infection. Electronically Signed   By: Lupita RaiderJames  Green Jr, M.D.   On: 07/10/2018 12:33    Medications:     Scheduled Medications: . atorvastatin  80 mg Oral q1800  . diltiazem  120 mg Oral Daily  . enoxaparin (LOVENOX) injection  60  mg Subcutaneous Q24H  . furosemide  80 mg Intravenous Q12H  . losartan  25 mg Oral Daily  . metolazone  2.5 mg Oral BID  . potassium chloride SA  40 mEq Oral BID  . sodium chloride flush  3 mL Intravenous Q12H  . sodium chloride flush  3 mL Intravenous Q12H  . sodium chloride flush  3 mL Intravenous Q12H  . spironolactone  25 mg Oral Daily    Infusions: . sodium chloride    . sodium chloride    . sodium chloride      PRN Medications: sodium chloride, sodium chloride, sodium chloride, acetaminophen, ondansetron (ZOFRAN) IV, sodium chloride flush, sodium chloride flush, sodium chloride flush    Patient Profile   Joel Rogers is a 67 y.o. male with h/o morbid obesity, pulmonary HTN, chronic a fibrillation, sleep apnea, HTN, HLD, chronic venous insufficiency, chronic diastolic CHF, and severe Aortic stenosis.   [facility]. Aborted due to resp distress requiring BiPAP in setting of A/C diastolic CHF and pulmonary hypertension.  Assessment/Plan   1. Acute on chronic diastolic (congestive) heart failure  - Decompensated in cath lab 07/06/18 requiring BiPAP and Nitro gtt for hypertensive emergency.  - Has diuresed 27 pounds. Much improved - Continue lasix 80 mg IV BID.  - Continue spiro 25 mg daily.  - Continue losartan 25 mg daily for BP.  - Normal coronaries but severe pulmonary venous HTN with high output state by cath 07/10/18. 2. Acute hypoxic resp distress - Resolved with Bipap and diuresis. No change.  3. Hypertensive Emergency - BP much improved with diuresis and med adjustment.  4. Severe aortic stenosis - Echo March 2019 EF 60% - severe AS, AVA 0.88, peak 76 mmHg, pulmonary HTN- PA 92 mmHg - Echo 07/07/18 EF 65-70% Severe AS Mean gradient (S): 57 mm Hg. Peak(S): 100 mm Hg. RVSP 66mmHG - [facility]. Will likely need TAVR 5. Morbid obesity  - Body mass index is 34.15 kg/m.  - h/o gastric bypass. Lost 140 lbs. No  change.  6. Obstructive sleep apnea - OSA improved on sleep study 05/16/18, likely with weight loss.  - Has stated he no longer needs CPAP based on recent sleep study.  7. Chronic atrial fibrillation  - Taken off anticoagulation secondary to SDH after a fall Dec 2017- - Rate improved with diuresis.   - Reconsider AC  8. Poor dentition - He will need dental consult if TAVR considered.No change.  9. Hypokalemia - k 4.0 today.   Length of Stay: 5  Luane SchoolMichael Andrew Tillery, PA-C  07/11/2018, 7:36 AM  Advanced Heart Failure Team Pager [phone] (M-F; 7a - 4p)  Please contact [facility] for night-coverage after hours (4p -7a ) and weekends on [email or web address]   Patient seen and examined with the above-signed Advanced Practice Provider and/or Housestaff.  I personally reviewed laboratory data, imaging studies and relevant notes. I independently examined the patient and formulated the important aspects of the plan. I have edited the note to reflect any of my changes or salient points. I have personally discussed the plan with the patient and/or family.  Continues to diurese well on IV lasix. Says he feels better than he has felt in many years. Results of yesterday's cath reviewed with him in detail. Will continue IV diuresis today then stop after tonight's dosing as he is preload dependent. Case d/w TAVR team. CTs done today. On initial review do not have any unexpected findings. Also seen by Dr. Kristin Bruins today for possible dental extractions on Friday.   On exam. Lying flat NAD. JVP 10. Cor IrR with AS murmur. Lungs CTA. Trace edema peripherally.   Arvilla Meres, MD  10:31 PM

## 2018-07-11 NOTE — Progress Notes (Signed)
DENTAL CONSULTATION  Date of Consultation:  07/11/2018 Patient Name:   Joel Rogers Date of Birth:   1951/11/04 Medical Record Number: [id number]  VITALS: BP 117/80 (BP Location: Left Arm)   Pulse 85   Temp 98 F (36.7 C) (Oral)   Resp (!) 22   Ht 6' (1.829 m)   Wt 114.2 kg   SpO2 98%   BMI 34.15 kg/m   CHIEF COMPLAINT: Patient referred by Dr. Gala RomneyBensimhon for dental consultation.  HPI: Joel Rogers is a 67 year old male recently diagnosed with severe aortic stenosis.  Patient with anticipated TAVR procedure on July 25, 2018 with Dr. Clifton JamesMcalhany and Dr. Laneta SimmersBartle.  Patient is now seen as part of a pre-heart valve surgery dental protocol examination to rule out dental infection that may affect the patient systemic health anticipated heart valve surgery.  Patient currently denies acute toothaches, swellings, or abscesses.  Patient indicates that he saw a dentist approximately 1 year ago for an exam and cleaning.  This was by [place].  The patient cannot remember the name of the dentist, however.  Patient indicates that he is currently planning to [facility] [place] [facility] for evaluation for extractions and possible implant therapy.  Patient indicates his appointment is scheduled for early next week by report.  Patient does not remember the name of that dentist either. The patient denies having any partial dentures at this time.  Denies having any dental phobia.  PROBLEM LIST: Patient Active Problem List   Diagnosis Date Noted  . Severe aortic stenosis 08/09/2017    Priority: High  . Acute diastolic CHF (congestive heart failure), NYHA class 4 (HCC)   . Acute on chronic diastolic (congestive) heart failure (HCC) 06/15/2018  . DJD (degenerative joint disease) 06/15/2018  . AKI (acute kidney injury) (HCC) 06/15/2018  . Poor dentition 06/15/2018  . Pulmonary hypertension, unspecified (HCC) 02/15/2018  . Varicose veins of right lower extremity  with complications 01/10/2018  . Vitamin D deficiency 12/02/2016  . Family history of prostate cancer in father 11/03/2016  . Status post gastric bypass for obesity 11/02/2016  . Subdural hematoma (HCC) 08/26/2016  . Syncope 07/13/2016  . HTN (hypertension) 04/13/2013  . Hyperlipemia 04/13/2013  . Chronic atrial fibrillation (HCC) 02/14/2013  . Acute myocardial infarction (HCC) 06/25/2008  . Morbid obesity (HCC) 06/18/2008  . Obstructive sleep apnea 06/18/2008    PMH: Past Medical History:  Diagnosis Date  . Arthritis   . Atrial fibrillation, chronic (HCC)   . Chronic diastolic CHF (congestive heart failure) (HCC) 06/15/2018  . Diabetes mellitus without complication (HCC)    borderline  . Hyperlipidemia   . Hypertension   . Morbid obesity (HCC)   . Normal coronary arteries    by cardiac catheterization performed 03/14/06  . Obesity   . Sleep apnea    on CPAP  . Venous insufficiency     PSH: Past Surgical History:  Procedure Laterality Date  . CRANIOTOMY Right 08/27/2016   Procedure: CRANIOTOMY HEMATOMA EVACUATION SUBDURAL;  Surgeon: Maeola HarmanJoseph Stern, MD;  [facility];  Service: Neurosurgery;  Laterality: Right;  . GASTRIC BYPASS  09/2008  . JOINT REPLACEMENT  08/31/2006   right hip  . RIGHT HEART CATH N/A 07/06/2018   Procedure: RIGHT HEART CATH;  Surgeon: Kathleene HazelMcAlhany, Christopher D, MD;  [facility];  Service: Cardiovascular;  Laterality: N/A;  . RIGHT/LEFT HEART CATH AND CORONARY ANGIOGRAPHY N/A 07/10/2018   Procedure: RIGHT/LEFT HEART CATH AND  CORONARY ANGIOGRAPHY;  Surgeon: Dolores Patty, MD;  Location: [facility] INVASIVE CV LAB;  Service: Cardiovascular;  Laterality: N/A;  . TOTAL HIP ARTHROPLASTY     right hip    ALLERGIES: No Known Allergies  MEDICATIONS: Current Facility-Administered Medications  Medication Dose Route Frequency Provider Last Rate Last Dose  . 0.9 %  sodium chloride infusion  250 mL Intravenous PRN Bensimhon, Bevelyn Buckles, MD      .  0.9 %  sodium chloride infusion  250 mL Intravenous PRN Bensimhon, Bevelyn Buckles, MD      . 0.9 %  sodium chloride infusion  250 mL Intravenous PRN Bensimhon, Bevelyn Buckles, MD      . acetaminophen (TYLENOL) tablet 650 mg  650 mg Oral Q4H PRN Bensimhon, Bevelyn Buckles, MD      . atorvastatin (LIPITOR) tablet 80 mg  80 mg Oral q1800 Bensimhon, Bevelyn Buckles, MD   80 mg at 07/10/18 1753  . diltiazem (CARDIZEM CD) 24 hr capsule 120 mg  120 mg Oral Daily Bensimhon, Bevelyn Buckles, MD   120 mg at 07/11/18 0944  . enoxaparin (LOVENOX) injection 60 mg  60 mg Subcutaneous Q24H Bensimhon, Bevelyn Buckles, MD   60 mg at 07/10/18 2146  . furosemide (LASIX) injection 80 mg  80 mg Intravenous Q12H Bensimhon, Bevelyn Buckles, MD   80 mg at 07/11/18 0945  . losartan (COZAAR) tablet 25 mg  25 mg Oral Daily Bensimhon, Bevelyn Buckles, MD   25 mg at 07/11/18 0944  . metolazone (ZAROXOLYN) tablet 2.5 mg  2.5 mg Oral BID Bensimhon, Bevelyn Buckles, MD   2.5 mg at 07/11/18 0944  . ondansetron (ZOFRAN) injection 4 mg  4 mg Intravenous Q6H PRN Bensimhon, Bevelyn Buckles, MD      . potassium chloride SA (K-DUR,KLOR-CON) CR tablet 40 mEq  40 mEq Oral BID Bensimhon, Bevelyn Buckles, MD   40 mEq at 07/11/18 0944  . sodium chloride flush (NS) 0.9 % injection 3 mL  3 mL Intravenous Q12H Bensimhon, Bevelyn Buckles, MD   3 mL at 07/11/18 0955  . sodium chloride flush (NS) 0.9 % injection 3 mL  3 mL Intravenous PRN Bensimhon, Bevelyn Buckles, MD      . sodium chloride flush (NS) 0.9 % injection 3 mL  3 mL Intravenous Q12H Bensimhon, Bevelyn Buckles, MD   3 mL at 07/11/18 0955  . sodium chloride flush (NS) 0.9 % injection 3 mL  3 mL Intravenous PRN Bensimhon, Bevelyn Buckles, MD   3 mL at 07/08/18 2129  . sodium chloride flush (NS) 0.9 % injection 3 mL  3 mL Intravenous Q12H Bensimhon, Bevelyn Buckles, MD   3 mL at 07/11/18 0955  . sodium chloride flush (NS) 0.9 % injection 3 mL  3 mL Intravenous PRN Bensimhon, Bevelyn Buckles, MD      . spironolactone (ALDACTONE) tablet 25 mg  25 mg Oral Daily Bensimhon, Bevelyn Buckles, MD   25 mg at 07/11/18  0944    LABS: Lab Results  Component Value Date   WBC 7.8 07/09/2018   HGB 10.7 (L) 07/09/2018   HCT 32.6 (L) 07/09/2018   MCV 73.8 (L) 07/09/2018   PLT 192 07/09/2018      Component Value Date/Time   NA 138 07/11/2018 0232   NA 142 06/27/2018 1050   K 4.0 07/11/2018 0232   CL 96 (L) 07/11/2018 0232   CO2 34 (H) 07/11/2018 0232   GLUCOSE 111 (H) 07/11/2018 0232   BUN 17 07/11/2018 0232   BUN  14 06/27/2018 1050   CREATININE 1.13 07/11/2018 0232   CREATININE 1.04 05/01/2018 0913   CALCIUM 9.7 07/11/2018 0232   GFRNONAA >60 07/11/2018 0232   GFRNONAA 74 05/01/2018 0913   GFRAA >60 07/11/2018 0232   GFRAA 86 05/01/2018 0913   Lab Results  Component Value Date   INR 1.25 07/09/2018   INR 1.07 08/27/2016   INR 1.13 08/26/2016   No results found for: PTT  SOCIAL HISTORY: Social History   Socioeconomic History  . Marital status: Married    Spouse name: Adela LankJacqueline  . Number of children: 2  . Years of education: 616  . Highest education level: Not on file  Occupational History  . 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 Financial resource strain: Not on file  . Food insecurity:    Worry: Not on file    Inability: Not on file  . Transportation needs:    Medical: Not on file    Non-medical: Not on file  Tobacco Use  . Smoking status: Never Smoker  . Smokeless tobacco: Never Used  Substance and Sexual Activity  . Alcohol use: No  . Drug use: No  . Sexual activity: Not Currently  Lifestyle  . Physical activity:    Days per week: Not on file    Minutes per session: Not on file  . Stress: Not on file  Relationships  . Social connections:    Talks on phone: Not on file    Gets together: Not on file    Attends religious service: Not on file    Active member of club or organization: Not on file    Attends meetings of clubs or organizations: Not on file    Relationship status: Not on file  . Intimate  partner violence:    Fear of current or ex partner: Not on file    Emotionally abused: Not on file    Physically abused: Not on file    Forced sexual activity: Not on file  Other Topics Concern  . Not on file  Social History Narrative   Doctorate in ministry   Retired Data processing managermaintenance mechanic   Disability from hip replacement   Lives with wife Teacher, adult educationJacqueline   Exercises at the [employer] - water exercise    FAMILY HISTORY: Family History  Problem Relation Age of Onset  . Hypertension Father   . Cancer Father        prob prostate  . Alzheimer's disease Mother   . Alzheimer's disease Maternal Grandfather   . Breast cancer Sister   . Cancer Brother        "brain tumor"    REVIEW OF SYSTEMS: Reviewed with the patient as per History of present illness. Psych: Patient denies having dental phobia.  DENTAL HISTORY: CHIEF COMPLAINT: Patient referred by Dr. Gala RomneyBensimhon for dental consultation.  HPI: Joel Rogers is a 67 year old male recently diagnosed with severe aortic stenosis.  Patient with anticipated TAVR procedure on July 25, 2018 with Dr. Clifton JamesMcalhany and Dr. Laneta SimmersBartle.  Patient is now seen as part of a pre-heart valve surgery dental protocol examination to rule out dental infection that may affect the patient systemic health anticipated heart valve surgery.  Patient currently denies acute toothaches, swellings, or abscesses.  Patient indicates that he saw a dentist approximately 1 year ago for an exam and cleaning.  This was by a dentist in [place].  The patient cannot remember the name of  the dentist, however.  Patient indicates that he is currently planning to see a dentist in [facility] for evaluation for extractions and possible implant therapy.  Patient indicates his appointment is scheduled for early next week by report.  Patient does not remember the name of that dentist either. The patient denies having any partial dentures at this time.  Denies having any  dental phobia.  DENTAL EXAMINATION: GENERAL: The patient is a well-developed, well-nourished male no acute distress. HEAD AND NECK: There is no palpable neck lymphadenopathy.  The patient denies acute TMJ symptoms. INTRAORAL EXAM: The patient has normal saliva.  There is no evidence of oral abscess formation. DENTITION: She has multiple missing teeth numbers 1, 2, 9,14-19, and 31. PERIODONTAL: Patient has chronic periodontitis with plaque calculus accumulations, generalized gingival recession, tooth mobility, and moderate to severe bone loss. DENTAL CARIES/SUBOPTIMAL RESTORATIONS: Multiple dental caries are noted.  Multiple flexure lesions are noted. ENDODONTIC: The patient currently denies acute pulpitis symptoms.  Patient has periapical pathology and radiolucency associated with apices of tooth numbers 30 and 32. CROWN AND BRIDGE: Patient has a ridge restoration from tooth numbers 9 through 11 that is suboptimal. PROSTHODONTIC: The patient denies having a partial denture. OCCLUSION: Patient has a poor occlusal scheme secondary to multiple missing teeth, supraeruption and drifting the unopposed teeth into the edentulous areas, lack replacement of missing teeth with dental prostheses, and class II malocclusion.  RADIOGRAPHIC INTERPRETATION: An orthopantogram was taken on 07/10/2018 and retaken on 07/11/2018. There are multiple missing teeth.  There is supra eruption drifting the unopposed teeth into the edentulous areas.  Multiple dental caries are noted.  There is moderate to severe bone loss noted.  There is a periapical radiolucency at the apices of tooth numbers 30 and 32.  Multiple radiolucent areas consistent with flexure lesions are noted.  Atrophy of the left body of the mandible.   ASSESSMENTS: 1.  Aortic stenosis 2.  Pre-aortic valve replacement dental protocol 3.  Chronic apical periodontitis 4.  Dental caries 5.  Multiple flexure lesions 6.  Chronic periodontitis of bone loss 7.   Accretions 8.  Gingival recession 9.  Tooth mobility 10.  Suboptimal dental restorations 11.  Multiple missing teeth 12.  Class II malocclusion 13.  Atrophy of the left body of the mandible 14.  Risk of bleeding with invasive dental procedures due to current Lovenox therapy 15.  Risk for complications up to and including death with anticipated invasive dental procedures in the operating room with general anesthesia. 16.  Questionable need for antibiotic premedication prior to invasive dental procedure secondary to previous hip replacement.  PLAN/RECOMMENDATIONS: 1. I discussed the risks, benefits, and complications of various treatment options with the patient in relationship to his medical and dental conditions, anticipated TAVR procedure, and risk for endocarditis. We discussed various treatment options to include no treatment, multiple extractions with alveoloplasty, pre-prosthetic surgery as indicated, periodontal therapy, dental restorations, root canal therapy, crown and bridge therapy, implant therapy, and replacement of missing teeth as indicated.  I specifically discussed the extraction of tooth numbers 7, 8, 10, 23-26, 30 and 32 with alveoloplasty and gross debridement of remaining dentition in the operating room with general anesthesia.  The patient was then to follow-up with a dentist of his choice for additional dental restorations and evaluation for replacement of missing teeth as indicated after adequate healing and once medically stable from the anticipated heart valve surgery.  The patient currently wishes to think about his options at this time.  Patient indicates that he currently has an appointment scheduled next week with a dentist in [facility] for evaluation of his comprehensive dental treatment needs.  Patient could not provide the name of the dentist nor the date of the anticipated evaluation at this time.  The patient indicates that he will contact his wife this evening  and discuss his future dental treatment needs with her.  The patient may be able to have the oral surgery scheduled for Friday as time and space permits in the OR schedule if that is the decision he makes.   2. Discussion of findings with medical team and coordination of future medical and dental care as needed.    Charlynne Pander, DDS

## 2018-07-12 MED ORDER — CEFAZOLIN SODIUM-DEXTROSE 2-4 GM/100ML-% IV SOLN
2.0000 g | INTRAVENOUS | Status: AC
  Administered 2018-07-13: 2 g via INTRAVENOUS

## 2018-07-12 MED ORDER — ENOXAPARIN SODIUM 60 MG/0.6ML ~~LOC~~ SOLN
60.0000 mg | SUBCUTANEOUS | Status: DC
  Administered 2018-07-13 – 2018-07-15 (×3): 60 mg via SUBCUTANEOUS
  Filled 2018-07-12 (×3): qty 0.6

## 2018-07-12 NOTE — Evaluation (Signed)
Physical Therapy Evaluation Patient Details Name: Joel Rogers MRN: [id number] DOB: 08-20-1951 Today's Date: 07/12/2018   History of Present Illness  Joel LeachRalph L Rogers is a 67 y.o. male with h/o morbid obesity, pulmonary HTN, chronic a fibrillation, sleep apnea, HTN, HLD, chronic venous insufficiency, chronic diastolic CHF, and severe Aortic stenosis.  Pt seen 8/6 for consideration of TAVR. Pt admitted for severe DOE and increasing peripheral edema.  Clinical Impression  Pt functioning near baseline. Pt up in room indep. PT asked to complete Pre-TAVR testing. See below. Pt safe to d/c home with spouse once medically stable. Per patient, pt to have dental distraction tomorrow AM. Pt will then go home sometime before his planned TAVR on 9/2. Acute PT to cont to follow.  6 Minute Walk Test  Distance - 656 ft      Pre    Post  BP     105/72    134/92  HR    86    104  SaO2    95    92  Modified Borg Dyspnea 0    1  RPE    6    11    5  Meter Walk Test  Trial   1) 7.74 sec   2) 6.79 sec  3) 6.17 sec  Average - 6.9 sec = .43 ft/sec   Clinical Frailty Scale - 2              Follow Up Recommendations No PT follow up;Supervision - Intermittent    Equipment Recommendations  None recommended by PT    Recommendations for Other Services       Precautions / Restrictions Precautions Precautions: None Restrictions Weight Bearing Restrictions: No      Mobility  Bed Mobility               General bed mobility comments: pt up in chair upon PT arrival  Transfers Overall transfer level: Modified independent Equipment used: None Transfers: Sit to/from Stand Sit to Stand: Modified independent (Device/Increase time)         General transfer comment: pushed up from chair, wide base of support, no difficulty  Ambulation/Gait Ambulation/Gait assistance: Supervision Gait Distance (Feet): 656 Feet Assistive device: None Gait Pattern/deviations:  Antalgic;Wide base of support Gait velocity: wfl Gait velocity interpretation: >2.62 ft/sec, indicative of community ambulatory General Gait Details: pt with L LE limp due to previous THA  Stairs            Wheelchair Mobility    Modified Rankin (Stroke Patients Only)       Balance Overall balance assessment: Mild deficits observed, not formally tested                                           Pertinent Vitals/Pain Pain Assessment: No/denies pain    Home Living Family/patient expects to be discharged to:: Private residence Living Arrangements: Spouse/significant other Available Help at Discharge: Family;Available 24 hours/day Type of Home: House Home Access: Stairs to enter Entrance Stairs-Rails: Right Entrance Stairs-Number of Steps: 2 Home Layout: One level Home Equipment: Walker - 2 wheels;Cane - single point      Prior Function Level of Independence: Independent         Comments: drives, mows lawns (riding Surveyor, mininglawn mower), hangs out at the [employer]     Hand Dominance   Dominant Hand: Right    Extremity/Trunk  Assessment   Upper Extremity Assessment Upper Extremity Assessment: Overall WFL for tasks assessed    Lower Extremity Assessment Lower Extremity Assessment: Generalized weakness(L antalgia from previous THA)    Cervical / Trunk Assessment Cervical / Trunk Assessment: Normal  Communication   Communication: No difficulties  Cognition Arousal/Alertness: Awake/alert Behavior During Therapy: WFL for tasks assessed/performed Overall Cognitive Status: Within Functional Limits for tasks assessed                                        General Comments General comments (skin integrity, edema, etc.): complete Pre- TAVR testing    Exercises     Assessment/Plan    PT Assessment Patient needs continued PT services  PT Problem List Decreased strength;Decreased activity tolerance;Decreased balance;Decreased  mobility       PT Treatment Interventions DME instruction;Gait training;Stair training;Functional mobility training;Therapeutic activities;Therapeutic exercise;Balance training;Neuromuscular re-education    PT Goals (Current goals can be found in the Care Plan section)  Acute Rehab PT Goals Patient Stated Goal: home PT Goal Formulation: With patient Time For Goal Achievement: 07/26/18 Potential to Achieve Goals: Good Additional Goals Additional Goal #1: Pt to score >19 on DGI to indicate minimal falls risk.    Frequency Min 2X/week   Barriers to discharge        Co-evaluation               AM-PAC PT "6 Clicks" Daily Activity  Outcome Measure Difficulty turning over in bed (including adjusting bedclothes, sheets and blankets)?: None Difficulty moving from lying on back to sitting on the side of the bed? : None Difficulty sitting down on and standing up from a chair with arms (e.g., wheelchair, bedside commode, etc,.)?: None Help needed moving to and from a bed to chair (including a wheelchair)?: None Help needed walking in hospital room?: None Help needed climbing 3-5 steps with a railing? : A Little 6 Click Score: 23    End of Session Equipment Utilized During Treatment: Gait belt Activity Tolerance: Patient tolerated treatment well Patient left: in chair;with call bell/phone within reach Nurse Communication: Mobility status PT Visit Diagnosis: Unsteadiness on feet (R26.81)    Time: 1410-1445 PT Time Calculation (min) (ACUTE ONLY): 35 min   Charges:   PT Evaluation $PT Eval Moderate Complexity: 1 Mod PT Treatments $Gait Training: 8-22 mins        Joel ShockAshly Yaniris Rogers, PT, DPT Pager #: [phone] Office #: [phone]   Joel Rogers 07/12/2018, 3:33 PM

## 2018-07-12 NOTE — Progress Notes (Addendum)
Advanced Heart Failure Rounding Note  PCP-Cardiologist: No primary care provider on file.   Subjective:    Weight down an additional 6 lbs. (33 total)   Now off IV lasix. Creatinine pending today. Feeling good this am. No orthopnea, SOB or PND. Doesn't understand why "so many" teeth need to be taken. Further explained how tooth infection can spread to mediastinum and be potentially fatal. Pt considering and wants to speak to his wife.   PFTs 07/10/18 - Completed, results pending.  R/LHC 07/10/18 Ao = 118/83 (104) LV = 207/30 RA = 17 RV = 87/13 PA = 91/34 (54) PCW = 28 Fick cardiac output/index = 10.3/4.4 PVR = 2.5 WU FA sat = 99% PA sat = 78%, 79%  Aortic valve: Mean gradient 67 mmHG  Peak gradient  AVA 1.06 cm2  Assessment:  1. Normal coronaries 2. Normal LV function EF 65% 3. Severe aortic stenosis 4. Severe pulmonary venous HTN with high-output state  Objective:   Weight Range: 114.2 kg Body mass index is 34.15 kg/m.   Vital Signs:   Temp:  [98 F (36.7 C)-98.8 F (37.1 C)] 98.7 F (37.1 C) (08/21 0436) Pulse Rate:  [70-85] 70 (08/21 0436) Resp:  [15-22] 15 (08/21 0025) BP: (95-117)/(54-80) 103/79 (08/21 0436) SpO2:  [97 %-100 %] 100 % (08/21 0436) Last BM Date: 07/09/18  Weight change: Filed Weights   07/09/18 1000 07/10/18 0500 07/11/18 0300  Weight: 120.9 kg 116.9 kg 114.2 kg    Intake/Output:   Intake/Output Summary (Last 24 hours) at 07/12/2018 0803 Last data filed at 07/12/2018 0000 Gross per 24 hour  Intake 1200 ml  Output 3676 ml  Net -2476 ml      Physical Exam    General: NAD  HEENT: Normal. Poor dentition. Anicteric  Neck: Supple. JVP 8-9 cm, Carotids 2+ bilat; radiated bruits. No thyromegaly or nodule noted. Cor: PMI nondisplaced. Irregularly irregular. 2/6 AS Lungs: CTAB, normal effort. Abdomen: Soft, non-tender, non-distended, no HSM. No bruits or masses. +BS  Extremities: no cyanosis, clubbing, rash, trace  edema Neuro: alert & oriented x 3, cranial nerves grossly intact. moves all 4 extremities w/o difficulty. Affect pleasant   Telemetry   Afib 70-80s, personally reviewed.   EKG    No new tracings.    Labs    CBC No results for input(s): WBC, NEUTROABS, HGB, HCT, MCV, PLT in the last 72 hours. Basic Metabolic Panel Recent Labs    29/56/21 0316 07/11/18 0232  NA 142 138  K 4.2 4.0  CL 102 96*  CO2 28 34*  GLUCOSE 96 111*  BUN 15 17  CREATININE 0.97 1.13  CALCIUM 9.7 9.7   Liver Function Tests No results for input(s): AST, ALT, ALKPHOS, BILITOT, PROT, ALBUMIN in the last 72 hours. No results for input(s): LIPASE, AMYLASE in the last 72 hours. Cardiac Enzymes No results for input(s): CKTOTAL, CKMB, CKMBINDEX, TROPONINI in the last 72 hours.  BNP: BNP (last 3 results) Recent Labs    06/10/18 2322 07/06/18 1810  BNP 685.0* 654.2*    ProBNP (last 3 results) No results for input(s): PROBNP in the last 8760 hours.   D-Dimer No results for input(s): DDIMER in the last 72 hours. Hemoglobin A1C No results for input(s): HGBA1C in the last 72 hours. Fasting Lipid Panel No results for input(s): CHOL, HDL, LDLCALC, TRIG, CHOLHDL, LDLDIRECT in the last 72 hours. Thyroid Function Tests No results for input(s): TSH, T4TOTAL, T3FREE, THYROIDAB in the last 72 hours.  Invalid input(s): FREET3  Other results:   Imaging   Ct Coronary Morph W/cta Cor W/score W/ca W/cm &/or Wo/cm  Addendum Date: 07/11/2018   ADDENDUM REPORT: 07/11/2018 13:42 EXAM: OVER-READ INTERPRETATION  CT CHEST The following report is an over-read performed by radiologist Dr. Royal Piedraaniel Entrikinof [facility] on 07/11/2018. This over-read does not include interpretation of cardiac or coronary anatomy or pathology. The coronary CTA interpretation by the cardiologist is attached. COMPARISON:  None. FINDINGS: Extracardiac findings will be described separately under dictation for contemporaneously  obtained CTA chest, abdomen and pelvis. IMPRESSION: Please see separate dictation for contemporaneously obtained CTA chest, abdomen and pelvis dated 07/11/2018 for full description of extracardiac findings. Electronically Signed   By: Trudie Reedaniel  Entrikin M.D.   On: 07/11/2018 13:42   Result Date: 07/11/2018 CLINICAL DATA:  Aortic Stenosis EXAM: Cardiac TAVR CT TECHNIQUE: The patient was scanned on a Siemens Force 192 slice scanner. A 120 kV retrospective scan was triggered in the ascending thoracic aorta at 140 HU's. Gantry rotation speed was 250 msecs and collimation was .6 mm. No beta blockade or nitro were given. The 3D data set was reconstructed in 5% intervals of the R-R cycle. Systolic and diastolic phases were analyzed on a dedicated work station using MPR, MIP and VRT modes. The patient received 80 cc of contrast. FINDINGS: Aortic Valve: Trileaflet severely calcified with restricted motion Aorta: Calcific atherosclerosis with no aneurysm Sino-tubular Junction: 25 mm Ascending Thoracic Aorta: 31 mm Aortic Arch: 29 mm Descending Thoracic Aorta: 25 mm Sinus of Valsalva Measurements: Non-coronary: 29.2 mm Right - coronary: 27.8 mm Left -   coronary: 29.4 mm Coronary Artery Height above Annulus: Left Main: 13 mm above annulus Right Coronary: 11.2 mm above annulus Virtual Basal Annulus Measurements: Maximum / Minimum Diameter: 23.5 mm x 26.5 mm Perimeter: 81.2 mm Area: 536 mm2 Coronary Arteries: Sufficient height above annulus for deployment Optimum Fluoroscopic Angle for Delivery: LAO 13 degrees Caudal 4 degrees IMPRESSION: 1. Calcified tri leaflet AV with annular area of 536 mm2 suitable for a 26 mm Sapien 3 valve 2. Optimum angiographic angle for deployment LAO 13 degrees Caudal 4 degrees 3.  Coronary arteries sufficient height above annulus for deployment 4.  Severe bi atrial enlargement cannot r/o laminated LAA thrombus 5.  Normal aortic root 3.1 cm Charlton HawsPeter Nishan Electronically Signed: By: Charlton HawsPeter  Nishan M.D.  On: 07/11/2018 13:09   Ct Angio Chest Aorta W/cm &/or Wo/cm  Result Date: 07/11/2018 CLINICAL DATA:  67 year old male with history of severe aortic stenosis. Preprocedural study prior to potential transcatheter aortic valve replacement (TAVR) procedure. EXAM: CT ANGIOGRAPHY CHEST, ABDOMEN AND PELVIS TECHNIQUE: Multidetector CT imaging through the chest, abdomen and pelvis was performed using the standard protocol during bolus administration of intravenous contrast. Multiplanar reconstructed images and MIPs were obtained and reviewed to evaluate the vascular anatomy. CONTRAST:  100mL ISOVUE-370 IOPAMIDOL (ISOVUE-370) INJECTION 76% COMPARISON:  No priors. FINDINGS: CTA CHEST FINDINGS Cardiovascular: Heart size is severely enlarged with severe left atrial dilatation. There is no significant pericardial fluid, thickening or pericardial calcification. There is aortic atherosclerosis, as well as atherosclerosis of the great vessels of the mediastinum and the coronary arteries, including calcified atherosclerotic plaque in the left main, left anterior descending, left circumflex and right coronary arteries. Severe calcifications of the aortic valve. Moderate calcifications of the mitral annulus. Dilatation of the pulmonic trunk (4.5 cm in diameter). Mediastinum/Lymph Nodes: No pathologically enlarged mediastinal or hilar lymph nodes. Esophagus is unremarkable in appearance. No axillary lymphadenopathy. Lungs/Pleura: Trace  right pleural effusion lying dependently. Several small pulmonary nodules are noted in the right lung, largest of which is in the medial aspect of the right lower lobe measuring 5 mm (axial image 78 of series 15). No acute consolidative airspace disease. Musculoskeletal/Soft Tissues: There are no aggressive appearing lytic or blastic lesions noted in the visualized portions of the skeleton. CTA ABDOMEN AND PELVIS FINDINGS Hepatobiliary: Liver has a nodular contour, suggesting underlying cirrhosis. No  suspicious cystic or solid hepatic lesions. No intra or extrahepatic biliary ductal dilatation. Gallbladder is moderately distended, but otherwise unremarkable in appearance. Pancreas: No pancreatic mass. No pancreatic or peripancreatic fluid or inflammatory changes. Spleen: Unremarkable. Adrenals/Urinary Tract: 2 mm nonobstructive calculus in the interpolar collecting system of the right kidney. Extensive scarring in the right kidney. 3 mm nonobstructive calculus in the lower pole collecting system of the left kidney. Subcentimeter low-attenuation lesion in the interpolar region of the left kidney, too small to characterize, but statistically likely to represent a cyst. No hydroureteronephrosis. Urinary bladder is largely obscured by beam hardening artifact fact from the patient's right hip arthroplasty, but is unremarkable in appearance. Stomach/Bowel: status post Roux-en-Y gastric bypass. No pathologic dilatation of small bowel or colon. Numerous colonic diverticulae are noted, particularly in the descending colon and sigmoid colon, without surrounding inflammatory changes to suggest an acute diverticulitis at this time. Normal appendix. Vascular/Lymphatic: Aortic atherosclerosis, with vascular findings and measurements pertinent to potential TAVR procedure, as detailed below. Please note, that the extensive beam hardening artifact in the associated image noise does limit assessment of this examination (as detailed below). No lymphadenopathy identified in the abdomen or pelvis. Reproductive: Prostate gland and seminal vesicles are completely obscured by beam hardening artifact. Other: No significant volume of ascites.  No pneumoperitoneum. Musculoskeletal: Status post right hip arthroplasty. There are no aggressive appearing lytic or blastic lesions noted in the visualized portions of the skeleton. VASCULAR MEASUREMENTS PERTINENT TO TAVR: AORTA: Minimal Aortic Diameter-19 x 17 mm Severity of Aortic  Calcification-mild RIGHT PELVIS: Right Common Iliac Artery - Minimal Diameter-9.7 x 9.9 mm Tortuosity-mild Calcification - moderate Right External Iliac Artery - Comment: A portion of the distal vessel is completely obscurred by beam hardening artifact from patient's right hip arthroplasty, and cannot be adequately evaluated. Minimal Diameter-8.3 x 7.3 mm Tortuosity-moderate Calcification - moderate Right Common Femoral Artery - Comment: A portion of the proximal vessel is completely obscurred by beam hardening artifact from patient's right hip arthroplasty, and cannot be adequately evaluated. Minimal Diameter-9.3 x 6.0 mm Tortuosity-mild Calcification - moderate LEFT PELVIS: Left Common Iliac Artery - Minimal Diameter-11.2 x 9.0 mm Tortuosity-mild Calcification - moderate Left External Iliac Artery - Minimal Diameter-8.7 x 7.2 mm Tortuosity-moderate Calcification - moderate Left Common Femoral Artery - Comment: Poorly evaluated secondary to extensive image noise. Minimal Diameter-8.7 x 6.3 mm Tortuosity-mild Calcification - moderate Review of the MIP images confirms the above findings. IMPRESSION: 1. Vascular findings and measurements pertinent to potential TAVR procedure, as detailed above. Please note that portions of today's examination were severely limited by beam hardening artifact from the patient's right hip arthroplasty, as indicated above. 2. Severe thickening calcification of the aortic valve, compatible with the reported clinical history of severe aortic stenosis. 3. Moderate calcification of the mitral annulus. 4. Cardiomegaly with severe left atrial dilatation. 5. Aortic atherosclerosis, in addition to left main and 3 vessel coronary artery disease. Please note that although the presence of coronary artery calcium documents the presence of coronary artery disease, the severity of this disease  and any potential stenosis cannot be assessed on this non-gated CT examination. Assessment for potential risk  factor modification, dietary therapy or pharmacologic therapy may be warranted, if clinically indicated. 6. Severe dilatation of the pulmonic trunk (4.5 cm in diameter), strongly suggestive of pulmonary arterial hypertension. 7. Trace right pleural effusion lying dependently. 8. Multiple small pulmonary nodules in the right lung measuring 5 mm or less in size. These are nonspecific, but are statistically likely benign. No follow-up needed if patient is low-risk (and has no known or suspected primary neoplasm). Non-contrast chest CT can be considered in 12 months if patient is high-risk. This recommendation follows the consensus statement: Guidelines for Management of Incidental Pulmonary Nodules Detected on CT Images: From the Fleischner Society 2017; Radiology 2017; 284:228-243. 9. Small nonobstructive calculi in the collecting systems of both kidneys. 10. Additional incidental findings, as above. Electronically Signed   By: Trudie Reed M.D.   On: 07/11/2018 14:48   Ct Angio Abd/pel W/ And/or W/o  Result Date: 07/11/2018 CLINICAL DATA:  67 year old male with history of severe aortic stenosis. Preprocedural study prior to potential transcatheter aortic valve replacement (TAVR) procedure. EXAM: CT ANGIOGRAPHY CHEST, ABDOMEN AND PELVIS TECHNIQUE: Multidetector CT imaging through the chest, abdomen and pelvis was performed using the standard protocol during bolus administration of intravenous contrast. Multiplanar reconstructed images and MIPs were obtained and reviewed to evaluate the vascular anatomy. CONTRAST:  ISOVUE-370 IOPAMIDOL (ISOVUE-370) INJECTION 76% COMPARISON:  No priors. FINDINGS: CTA CHEST FINDINGS Cardiovascular: Heart size is severely enlarged with severe left atrial dilatation. There is no significant pericardial fluid, thickening or pericardial calcification. There is aortic atherosclerosis, as well as atherosclerosis of the great vessels of the mediastinum and the coronary arteries,  including calcified atherosclerotic plaque in the left main, left anterior descending, left circumflex and right coronary arteries. Severe calcifications of the aortic valve. Moderate calcifications of the mitral annulus. Dilatation of the pulmonic trunk (4.5 cm in diameter). Mediastinum/Lymph Nodes: No pathologically enlarged mediastinal or hilar lymph nodes. Esophagus is unremarkable in appearance. No axillary lymphadenopathy. Lungs/Pleura: Trace right pleural effusion lying dependently. Several small pulmonary nodules are noted in the right lung, largest of which is in the medial aspect of the right lower lobe measuring 5 mm (axial image 78 of series 15). No acute consolidative airspace disease. Musculoskeletal/Soft Tissues: There are no aggressive appearing lytic or blastic lesions noted in the visualized portions of the skeleton. CTA ABDOMEN AND PELVIS FINDINGS Hepatobiliary: Liver has a nodular contour, suggesting underlying cirrhosis. No suspicious cystic or solid hepatic lesions. No intra or extrahepatic biliary ductal dilatation. Gallbladder is moderately distended, but otherwise unremarkable in appearance. Pancreas: No pancreatic mass. No pancreatic or peripancreatic fluid or inflammatory changes. Spleen: Unremarkable. Adrenals/Urinary Tract: 2 mm nonobstructive calculus in the interpolar collecting system of the right kidney. Extensive scarring in the right kidney. 3 mm nonobstructive calculus in the lower pole collecting system of the left kidney. Subcentimeter low-attenuation lesion in the interpolar region of the left kidney, too small to characterize, but statistically likely to represent a cyst. No hydroureteronephrosis. Urinary bladder is largely obscured by beam hardening artifact fact from the patient's right hip arthroplasty, but is unremarkable in appearance. Stomach/Bowel: status post Roux-en-Y gastric bypass. No pathologic dilatation of small bowel or colon. Numerous colonic diverticulae are  noted, particularly in the descending colon and sigmoid colon, without surrounding inflammatory changes to suggest an acute diverticulitis at this time. Normal appendix. Vascular/Lymphatic: Aortic atherosclerosis, with vascular findings and measurements pertinent to potential TAVR  procedure, as detailed below. Please note, that the extensive beam hardening artifact in the associated image noise does limit assessment of this examination (as detailed below). No lymphadenopathy identified in the abdomen or pelvis. Reproductive: Prostate gland and seminal vesicles are completely obscured by beam hardening artifact. Other: No significant volume of ascites.  No pneumoperitoneum. Musculoskeletal: Status post right hip arthroplasty. There are no aggressive appearing lytic or blastic lesions noted in the visualized portions of the skeleton. VASCULAR MEASUREMENTS PERTINENT TO TAVR: AORTA: Minimal Aortic Diameter-19 x 17 mm Severity of Aortic Calcification-mild RIGHT PELVIS: Right Common Iliac Artery - Minimal Diameter-9.7 x 9.9 mm Tortuosity-mild Calcification - moderate Right External Iliac Artery - Comment: A portion of the distal vessel is completely obscurred by beam hardening artifact from patient's right hip arthroplasty, and cannot be adequately evaluated. Minimal Diameter-8.3 x 7.3 mm Tortuosity-moderate Calcification - moderate Right Common Femoral Artery - Comment: A portion of the proximal vessel is completely obscurred by beam hardening artifact from patient's right hip arthroplasty, and cannot be adequately evaluated. Minimal Diameter-9.3 x 6.0 mm Tortuosity-mild Calcification - moderate LEFT PELVIS: Left Common Iliac Artery - Minimal Diameter-11.2 x 9.0 mm Tortuosity-mild Calcification - moderate Left External Iliac Artery - Minimal Diameter-8.7 x 7.2 mm Tortuosity-moderate Calcification - moderate Left Common Femoral Artery - Comment: Poorly evaluated secondary to extensive image noise. Minimal Diameter-8.7 x  6.3 mm Tortuosity-mild Calcification - moderate Review of the MIP images confirms the above findings. IMPRESSION: 1. Vascular findings and measurements pertinent to potential TAVR procedure, as detailed above. Please note that portions of today's examination were severely limited by beam hardening artifact from the patient's right hip arthroplasty, as indicated above. 2. Severe thickening calcification of the aortic valve, compatible with the reported clinical history of severe aortic stenosis. 3. Moderate calcification of the mitral annulus. 4. Cardiomegaly with severe left atrial dilatation. 5. Aortic atherosclerosis, in addition to left main and 3 vessel coronary artery disease. Please note that although the presence of coronary artery calcium documents the presence of coronary artery disease, the severity of this disease and any potential stenosis cannot be assessed on this non-gated CT examination. Assessment for potential risk factor modification, dietary therapy or pharmacologic therapy may be warranted, if clinically indicated. 6. Severe dilatation of the pulmonic trunk (4.5 cm in diameter), strongly suggestive of pulmonary arterial hypertension. 7. Trace right pleural effusion lying dependently. 8. Multiple small pulmonary nodules in the right lung measuring 5 mm or less in size. These are nonspecific, but are statistically likely benign. No follow-up needed if patient is low-risk (and has no known or suspected primary neoplasm). Non-contrast chest CT can be considered in 12 months if patient is high-risk. This recommendation follows the consensus statement: Guidelines for Management of Incidental Pulmonary Nodules Detected on CT Images: From the Fleischner Society 2017; Radiology 2017; 284:228-243. 9. Small nonobstructive calculi in the collecting systems of both kidneys. 10. Additional incidental findings, as above. Electronically Signed   By: Trudie Reed M.D.   On: 07/11/2018 14:48     Medications:     Scheduled Medications: . atorvastatin  80 mg Oral q1800  . diltiazem  120 mg Oral Daily  . enoxaparin (LOVENOX) injection  60 mg Subcutaneous Q24H  . losartan  25 mg Oral Daily  . sodium chloride flush  3 mL Intravenous Q12H  . sodium chloride flush  3 mL Intravenous Q12H  . sodium chloride flush  3 mL Intravenous Q12H  . spironolactone  25 mg Oral Daily  Infusions: . sodium chloride    . sodium chloride    . sodium chloride      PRN Medications: sodium chloride, sodium chloride, sodium chloride, acetaminophen, ondansetron (ZOFRAN) IV, sodium chloride flush, sodium chloride flush, sodium chloride flush    Patient Profile   Claudie LeachRalph L Markert is a 67 y.o. male with h/o morbid obesity, pulmonary HTN, chronic a fibrillation, sleep apnea, HTN, HLD, chronic venous insufficiency, chronic diastolic CHF, and severe Aortic stenosis.   [facility]. Aborted due to resp distress requiring BiPAP in setting of A/C diastolic CHF and pulmonary hypertension.  Assessment/Plan   1. Acute on chronic diastolic (congestive) heart failure  - Decompensated in cath lab 07/06/18 requiring BiPAP and Nitro gtt for hypertensive emergency.  - Has diuresed 33 pounds. Much improved - Off lasix for now. Await BMET. Resume po diuretics soon.  - Continue spiro 25 mg daily.  - Continue losartan 25 mg daily for BP.  - Normal coronaries but severe pulmonary venous HTN with high output state by cath 07/10/18. 2. Acute hypoxic resp distress - Resolved with Bipap and diuresis. No change.  3. Hypertensive Emergency - BP stable now with diuresis and med adjustment.  4. Severe aortic stenosis - Echo March 2019 EF 60% - severe AS, AVA 0.88, peak 76 mmHg, pulmonary HTN- PA 92 mmHg - Echo 07/07/18 EF 65-70% Severe AS Mean gradient (S): 57 mm Hg. Peak(S): 100 mm Hg. RVSP 66mmHG - [facility].  - Will need TAVR. Structural team consulted.  5.  Morbid obesity  - Body mass index is 34.15 kg/m.  - h/o gastric bypass. Lost 140 lbs. No change.  6. Obstructive sleep apnea - OSA improved on sleep study 05/16/18, likely with weight loss.  - Has stated he no longer needs CPAP based on recent sleep study. No change.  7. Chronic atrial fibrillation  - Taken off anticoagulation secondary to SDH after a fall Dec 2017- - Rate improved with diuresis.   - Reconsider AC at some point. Now on hold for multiple possible procedures.  8. Poor dentition - Seen by Dr. Kristin BruinsKulinski. Possible for multiple teeth extraction Friday tentatively, pending consent. 9. Hypokalemia - Pending today.    Length of Stay: [address]  Graciella FreerMichael Andrew Tillery, [place]-C  07/12/2018, 8:03 AM  Advanced Heart Failure Team Pager [phone] (M-F; 7a - 4p)  Please contact [facility] for night-coverage after hours (4p -7a ) and weekends on [email or web address]  Patient seen and examined with the above-signed Advanced Practice Provider and/or Housestaff. I personally reviewed laboratory data, imaging studies and relevant notes. I independently examined the patient and formulated the important aspects of the plan. I have edited the note to reflect any of my changes or salient points. I have personally discussed the plan with the patient and/or family.  Looks great from HF perspective. Volume status optimized. CTs and PFTs reviewed. Significant obstruction/restriction. Off lasix for now. Await BMET. Resume po diuretics soon.  Discussed need for dental extractions at length. He has agreed to proceed. I discussed with Dr. Kristin BruinsKulinski. Timing of TAVR per structural heart team. Tolerating DVT dose enoxaparin well. Will consider resumption of AC at some point.   Arvilla Meresaniel Rosan Calbert, MD  8:59 AM

## 2018-07-12 NOTE — Progress Notes (Signed)
Pt working with PT. Will f/u tomorrow. Ethelda ChickKristan Harjit Leider CES, ACSM 2:06 PM 07/12/2018

## 2018-07-12 NOTE — Progress Notes (Signed)
Spoke with Dr Kristin BruinsKulinski. Pt needs to have last dose of lovenox at latest around 2 pm today. He can resume 12 hours after dental extraction tomorrow, around 22:00. Discussed with pharmacist. Lovenox adjustments made.   Alford HighlandAshley M Caine Barfield, NP

## 2018-07-12 NOTE — Plan of Care (Signed)
  Problem: Health Behavior/Discharge Planning: Goal: Ability to manage health-related needs will improve Outcome: Progressing   Problem: Clinical Measurements: Goal: Ability to maintain clinical measurements within normal limits will improve Outcome: Progressing Goal: Will remain free from infection Outcome: Progressing Goal: Diagnostic test results will improve Outcome: Progressing Goal: Respiratory complications will improve Outcome: Progressing Goal: Cardiovascular complication will be avoided Outcome: Progressing   Problem: Coping: Goal: Level of anxiety will decrease Outcome: Progressing   Problem: Safety: Goal: Ability to remain free from injury will improve Outcome: Progressing   Problem: Skin Integrity: Goal: Risk for impaired skin integrity will decrease Outcome: Progressing   

## 2018-07-12 NOTE — Anesthesia Preprocedure Evaluation (Addendum)
Anesthesia Evaluation  Patient identified by MRN, date of birth, ID band Patient awake    Reviewed: Allergy & Precautions, NPO status , Patient's Chart, lab work & pertinent test results  History of Anesthesia Complications Negative for: history of anesthetic complications  Airway Mallampati: I  TM Distance: >3 FB Neck ROM: Full    Dental  (+) Dental Advisory Given, Poor Dentition, Loose   Pulmonary sleep apnea (uses CPAP sometimes) and Continuous Positive Airway Pressure Ventilation ,    breath sounds clear to auscultation       Cardiovascular hypertension, Pt. on medications + CAD, + Past MI, + Cardiac Stents, + Peripheral Vascular Disease and +CHF  + Valvular Problems/Murmurs AS  Rhythm:Regular Rate:Normal   '19 Cath - Normal coronaries,  Normal LV function EF 65%, Severe aortic stenosis, Severe pulmonary venous HTN with high-output state  '19 TTE - Moderate LVH. EF 65% to 70%. Severe AS, trivial AI. Mean gradient (S): 57 mm Hg. Peak gradient (S): 100 mm Hg. Valve area (VTI): 0.97 cm^2. Trivial MR. Severely dilated LA. Mildly dilated RV. RA severely dilated. PASP: 66 mm Hg     Neuro/Psych negative neurological ROS  negative psych ROS   GI/Hepatic negative GI ROS, Neg liver ROS,   Endo/Other  diabetes (Denies) Obesity   Renal/GU negative Renal ROS  negative genitourinary   Musculoskeletal  (+) Arthritis ,   Abdominal   Peds  Hematology  (+) anemia ,   Anesthesia Other Findings   Reproductive/Obstetrics                            Anesthesia Physical Anesthesia Plan  ASA: IV  Anesthesia Plan: MAC   Post-op Pain Management:    Induction: Intravenous  PONV Risk Score and Plan: 2 and Treatment may vary due to age or medical condition, Ondansetron and Propofol infusion  Airway Management Planned: Nasal Cannula and Natural Airway  Additional Equipment: Arterial line  Intra-op  Plan:   Post-operative Plan:   Informed Consent: I have reviewed the patients History and Physical, chart, labs and discussed the procedure including the risks, benefits and alternatives for the proposed anesthesia with the patient or authorized representative who has indicated his/her understanding and acceptance.   Dental advisory given  Plan Discussed with: CRNA, Anesthesiologist and Surgeon  Anesthesia Plan Comments: (Will have vasopressin available given significant pulmonary hypertension.   Plan for [place] with ketamine and propofol. Will convert to GETA with nasal ETT if needed. Stressed significant risks of morbidity and mortality to both patient and DDS, possibility that patient will require ICU level care with postoperative ventilation should we need to place ETT.)      Anesthesia Quick Evaluation

## 2018-07-12 NOTE — Progress Notes (Signed)
07/12/2018  Patient:            Claudie LeachRalph L Rosemond Date of Birth:  11-27-1950 MRN:                [id number]   BP (!) 74/61 (BP Location: Right Arm)   Pulse 63   Temp (!) 97.4 F (36.3 C) (Oral)   Resp (!) 23   Ht 6' (1.829 m)   Wt 114.2 kg   SpO2 94%   BMI 34.15 kg/m    I had a conversation with Dr. Gala RomneyBensimhon this morning. Patient has agreed to proceed with necessary dental extractions with alveoloplasty and dental cleaning of teeth in the operating room with general anesthesia. This has been scheduled for Thursday morning at 7:30 AM in the operating room with general anesthesia. The plan is to proceed with extraction of tooth numbers 8, 10, 23, 24, 25, 26, 30, and 32 with alveoloplasty with consideration for extraction of tooth numbers 3, 7, and 13 as needed upon urther examination in the operating room. Patient was made aware of the risk for bleeding, bruising, swelling, infection, nerve damage, root tip fracture, mandible fracture, damage to soft tissues, damage to adjacent teeth, sinus involvement, other complications not mentioned above. Patient also is aware of the risk for complications up to and including death due to his overall cardiovascular and respiratory compromise. Patient is aware that he will need follow-up with a dentist of his choice for evaluation for replacement of missing teeth and continued periodontal therapy once he is medically stable from his anticipated heart valve surgery.   Charlynne Panderonald F Kulinski, DDS

## 2018-07-13 ENCOUNTER — Inpatient Hospital Stay (HOSPITAL_COMMUNITY): Payer: Medicare Other

## 2018-07-13 ENCOUNTER — Encounter (HOSPITAL_COMMUNITY): Payer: Self-pay | Admitting: Certified Registered"

## 2018-07-13 ENCOUNTER — Encounter (HOSPITAL_COMMUNITY)
Admission: AD | Disposition: A | Discharge status: Home / Self Care | Payer: Self-pay | Source: Ambulatory Visit | Attending: Internal Medicine

## 2018-07-13 DIAGNOSIS — K0889 Other specified disorders of teeth and supporting structures: Secondary | ICD-10-CM

## 2018-07-13 DIAGNOSIS — I350 Nonrheumatic aortic (valve) stenosis: Secondary | ICD-10-CM

## 2018-07-13 DIAGNOSIS — K036 Deposits [accretions] on teeth: Secondary | ICD-10-CM

## 2018-07-13 DIAGNOSIS — K045 Chronic apical periodontitis: Secondary | ICD-10-CM

## 2018-07-13 DIAGNOSIS — K0530 Chronic periodontitis, unspecified: Secondary | ICD-10-CM

## 2018-07-13 DIAGNOSIS — K031 Abrasion of teeth: Secondary | ICD-10-CM

## 2018-07-13 DIAGNOSIS — K029 Dental caries, unspecified: Secondary | ICD-10-CM

## 2018-07-13 HISTORY — PX: MULTIPLE EXTRACTIONS WITH ALVEOLOPLASTY: SHX5342

## 2018-07-13 LAB — GLUCOSE, CAPILLARY: Glucose-Capillary: 102 mg/dL — ABNORMAL HIGH (ref 70–99)

## 2018-07-13 LAB — BASIC METABOLIC PANEL
Anion gap: 10 (ref 5–15)
BUN: 25 mg/dL — ABNORMAL HIGH (ref 8–23)
CALCIUM: 9.1 mg/dL (ref 8.9–10.3)
CO2: 28 mmol/L (ref 22–32)
Chloride: 97 mmol/L — ABNORMAL LOW (ref 98–111)
Creatinine, Ser: 1.29 mg/dL — ABNORMAL HIGH (ref 0.61–1.24)
GFR, EST NON AFRICAN AMERICAN: 56 mL/min — AB (ref >60)
GLUCOSE: 101 mg/dL — AB (ref 70–99)
POTASSIUM: 4.4 mmol/L (ref 3.5–5.1)
SODIUM: 135 mmol/L (ref 135–145)

## 2018-07-13 SURGERY — MULTIPLE EXTRACTION WITH ALVEOLOPLASTY
Anesthesia: General | Site: Mouth

## 2018-07-13 MED ORDER — KETAMINE HCL 50 MG/5ML IJ SOSY
PREFILLED_SYRINGE | INTRAMUSCULAR | Status: AC
  Filled 2018-07-13: qty 5

## 2018-07-13 MED ORDER — LACTATED RINGERS IV SOLN
INTRAVENOUS | Status: DC | PRN
  Administered 2018-07-13: 07:00:00 via INTRAVENOUS

## 2018-07-13 MED ORDER — ONDANSETRON HCL 4 MG/2ML IJ SOLN
INTRAMUSCULAR | Status: AC
  Filled 2018-07-13: qty 2

## 2018-07-13 MED ORDER — OXYCODONE HCL 5 MG/5ML PO SOLN: 5.0000 mg | Freq: Once | ORAL | Status: DC | PRN

## 2018-07-13 MED ORDER — PROPOFOL 500 MG/50ML IV EMUL
INTRAVENOUS | Status: DC | PRN
  Administered 2018-07-13: 20 ug/kg/min via INTRAVENOUS

## 2018-07-13 MED ORDER — OXYCODONE-ACETAMINOPHEN 5-325 MG PO TABS
1.0000 | ORAL_TABLET | Freq: Four times a day (QID) | ORAL | Status: DC | PRN
  Administered 2018-07-13: 2 via ORAL
  Administered 2018-07-14: 1 via ORAL
  Administered 2018-07-14: 2 via ORAL
  Administered 2018-07-15 (×2): 0.5 via ORAL
  Administered 2018-07-15: 1 via ORAL
  Administered 2018-07-16: 0.5 via ORAL
  Filled 2018-07-13 (×2): qty 2
  Filled 2018-07-13 (×2): qty 1
  Filled 2018-07-13: qty 2
  Filled 2018-07-13 (×3): qty 1

## 2018-07-13 MED ORDER — STERILE WATER FOR IRRIGATION IR SOLN
Status: DC | PRN
  Administered 2018-07-13: 1000 mL

## 2018-07-13 MED ORDER — KETAMINE HCL 10 MG/ML IJ SOLN
INTRAMUSCULAR | Status: DC | PRN
  Administered 2018-07-13: 20 mg via INTRAVENOUS
  Administered 2018-07-13 (×2): 10 mg via INTRAVENOUS
  Administered 2018-07-13 (×2): 20 mg via INTRAVENOUS
  Administered 2018-07-13 (×2): 10 mg via INTRAVENOUS
  Administered 2018-07-13: 30 mg via INTRAVENOUS

## 2018-07-13 MED ORDER — BUPIVACAINE-EPINEPHRINE 0.5% -1:200000 IJ SOLN
INTRAMUSCULAR | Status: DC | PRN
  Administered 2018-07-13 (×2): 1.8 mL

## 2018-07-13 MED ORDER — 0.9 % SODIUM CHLORIDE (POUR BTL) OPTIME
TOPICAL | Status: DC | PRN
  Administered 2018-07-13: 1000 mL

## 2018-07-13 MED ORDER — ONDANSETRON HCL 4 MG/2ML IJ SOLN: 4.0000 mg | Freq: Once | INTRAMUSCULAR | Status: DC | PRN

## 2018-07-13 MED ORDER — MIDAZOLAM HCL 2 MG/2ML IJ SOLN
INTRAMUSCULAR | Status: AC
  Filled 2018-07-13: qty 2

## 2018-07-13 MED ORDER — BUPIVACAINE-EPINEPHRINE (PF) 0.5% -1:200000 IJ SOLN
INTRAMUSCULAR | Status: AC
  Filled 2018-07-13: qty 3.6

## 2018-07-13 MED ORDER — MORPHINE SULFATE (PF) 2 MG/ML IV SOLN
2.0000 mg | INTRAVENOUS | Status: DC | PRN
  Administered 2018-07-13 – 2018-07-14 (×3): 4 mg via INTRAVENOUS
  Administered 2018-07-14: 2 mg via INTRAVENOUS
  Administered 2018-07-14: 4 mg via INTRAVENOUS
  Filled 2018-07-13 (×2): qty 2
  Filled 2018-07-13: qty 1
  Filled 2018-07-13 (×2): qty 2

## 2018-07-13 MED ORDER — ONDANSETRON HCL 4 MG/2ML IJ SOLN
INTRAMUSCULAR | Status: DC | PRN
  Administered 2018-07-13: 4 mg via INTRAVENOUS

## 2018-07-13 MED ORDER — MIDAZOLAM HCL 5 MG/5ML IJ SOLN
INTRAMUSCULAR | Status: DC | PRN
  Administered 2018-07-13 (×3): 1 mg via INTRAVENOUS

## 2018-07-13 MED ORDER — LIDOCAINE-EPINEPHRINE 2 %-1:100000 IJ SOLN
INTRAMUSCULAR | Status: DC | PRN
  Administered 2018-07-13 (×6): 1.7 mL via INTRADERMAL

## 2018-07-13 MED ORDER — FENTANYL CITRATE (PF) 250 MCG/5ML IJ SOLN
INTRAMUSCULAR | Status: AC
  Filled 2018-07-13: qty 5

## 2018-07-13 MED ORDER — HEMOSTATIC AGENTS (NO CHARGE) OPTIME
TOPICAL | Status: DC | PRN
  Administered 2018-07-13: 1 via TOPICAL

## 2018-07-13 MED ORDER — ISOPROPYL ALCOHOL 70 % SOLN
Status: DC | PRN
  Administered 2018-07-13: 1 via TOPICAL

## 2018-07-13 MED ORDER — LACTATED RINGERS IV SOLN
INTRAVENOUS | Status: DC
  Administered 2018-07-13: 13:00:00 via INTRAVENOUS

## 2018-07-13 MED ORDER — FENTANYL CITRATE (PF) 100 MCG/2ML IJ SOLN
25.0000 ug | INTRAMUSCULAR | Status: DC | PRN
  Administered 2018-07-13: 25 ug via INTRAVENOUS
  Administered 2018-07-13: 50 ug via INTRAVENOUS
  Administered 2018-07-13: 25 ug via INTRAVENOUS

## 2018-07-13 MED ORDER — OXYMETAZOLINE HCL 0.05 % NA SOLN
NASAL | Status: AC
  Filled 2018-07-13: qty 15

## 2018-07-13 MED ORDER — FENTANYL CITRATE (PF) 100 MCG/2ML IJ SOLN
INTRAMUSCULAR | Status: DC | PRN
  Administered 2018-07-13 (×4): 25 ug via INTRAVENOUS

## 2018-07-13 MED ORDER — FENTANYL CITRATE (PF) 100 MCG/2ML IJ SOLN
INTRAMUSCULAR | Status: AC
  Filled 2018-07-13: qty 2

## 2018-07-13 MED ORDER — OXYCODONE HCL 5 MG PO TABS: 5.0000 mg | ORAL_TABLET | Freq: Once | ORAL | Status: DC | PRN

## 2018-07-13 MED ORDER — PROPOFOL 10 MG/ML IV BOLUS
INTRAVENOUS | Status: AC
  Filled 2018-07-13: qty 20

## 2018-07-13 SURGICAL SUPPLY — 41 items
ALCOHOL 70% 16 OZ (MISCELLANEOUS) ×3 IMPLANT
ATTRACTOMAT 16X20 MAGNETIC DRP (DRAPES) ×3 IMPLANT
BANDAGE HEMOSTAT MRDH 4X4 STRL (MISCELLANEOUS) ×1 IMPLANT
BLADE SURG 15 STRL LF DISP TIS (BLADE) ×2 IMPLANT
BLADE SURG 15 STRL SS (BLADE) ×6
BNDG HEMOSTAT MRDH 4X4 STRL (MISCELLANEOUS) ×3
COVER SURGICAL LIGHT HANDLE (MISCELLANEOUS) ×3 IMPLANT
GAUZE 4X4 16PLY RFD (DISPOSABLE) ×6 IMPLANT
GAUZE PACKING FOLDED 2  STR (GAUZE/BANDAGES/DRESSINGS)
GAUZE PACKING FOLDED 2 STR (GAUZE/BANDAGES/DRESSINGS) IMPLANT
GLOVE BIO SURGEON STRL SZ 6.5 (GLOVE) ×2 IMPLANT
GLOVE BIO SURGEONS STRL SZ 6.5 (GLOVE) ×1
GLOVE SURG ORTHO 8.0 STRL STRW (GLOVE) ×3 IMPLANT
GOWN STRL REUS W/ TWL LRG LVL3 (GOWN DISPOSABLE) ×1 IMPLANT
GOWN STRL REUS W/TWL 2XL LVL3 (GOWN DISPOSABLE) ×3 IMPLANT
GOWN STRL REUS W/TWL LRG LVL3 (GOWN DISPOSABLE) ×3
HEMOSTAT SURGICEL 2X14 (HEMOSTASIS) ×3 IMPLANT
KIT BASIN OR (CUSTOM PROCEDURE TRAY) ×3 IMPLANT
KIT TURNOVER KIT B (KITS) ×3 IMPLANT
MANIFOLD NEPTUNE WASTE (CANNULA) ×3 IMPLANT
NEEDLE BLUNT 16X1.5 OR ONLY (NEEDLE) ×3 IMPLANT
NEEDLE DENTAL 27 LONG (NEEDLE) ×6 IMPLANT
NS IRRIG 1000ML POUR BTL (IV SOLUTION) ×3 IMPLANT
PACK EENT II TURBAN DRAPE (CUSTOM PROCEDURE TRAY) ×3 IMPLANT
PAD ARMBOARD 7.5X6 YLW CONV (MISCELLANEOUS) ×3 IMPLANT
SPONGE SURGIFOAM ABS GEL 100 (HEMOSTASIS) IMPLANT
SPONGE SURGIFOAM ABS GEL 12-7 (HEMOSTASIS) IMPLANT
SPONGE SURGIFOAM ABS GEL SZ50 (HEMOSTASIS) IMPLANT
SUCTION FRAZIER HANDLE 10FR (MISCELLANEOUS) ×2
SUCTION TUBE FRAZIER 10FR DISP (MISCELLANEOUS) ×1 IMPLANT
SUT CHROMIC 3 0 PS 2 (SUTURE) ×9 IMPLANT
SUT CHROMIC 4 0 P 3 18 (SUTURE) IMPLANT
SUT SILK 2 0 (SUTURE) ×3
SUT SILK 2-0 18XBRD TIE 12 (SUTURE) ×1 IMPLANT
SYR 50ML SLIP (SYRINGE) ×3 IMPLANT
TOWEL NATURAL 10PK STERILE (DISPOSABLE) ×3 IMPLANT
TUBE CONNECTING 12'X1/4 (SUCTIONS) ×1
TUBE CONNECTING 12X1/4 (SUCTIONS) ×2 IMPLANT
WATER STERILE IRR 1000ML POUR (IV SOLUTION) ×3 IMPLANT
WATER TABLETS ICX (MISCELLANEOUS) ×3 IMPLANT
YANKAUER SUCT BULB TIP NO VENT (SUCTIONS) ×3 IMPLANT

## 2018-07-13 NOTE — Transfer of Care (Signed)
Immediate Anesthesia Transfer of Care Note  Patient: Joel Rogers  Procedure(s) Performed: Extraction of tooth #'s 3,8,10, 23-26, 30and 77 with alveoloplasty and gross debridement of remaining teeth (N/A Mouth)  Patient Location: PACU  Anesthesia Type:MAC  Level of Consciousness: awake, alert  and patient cooperative  Airway & Oxygen Therapy: Patient Spontanous Breathing and Patient connected to nasal cannula oxygen  Post-op Assessment: Report given to RN and Post -op Vital signs reviewed and stable  Post vital signs: Reviewed and stable  Last Vitals:  Vitals Value Taken Time  BP    Temp    Pulse 111 07/13/2018  9:16 AM  Resp    SpO2 99 % 07/13/2018  9:16 AM  Vitals shown include unvalidated device data.  Last Pain:  Vitals:   07/13/18 0338  TempSrc: Oral  PainSc:          Complications: No apparent anesthesia complications

## 2018-07-13 NOTE — Op Note (Signed)
OPERATIVE REPORT  Patient:            Joel Rogers Date of Birth:  June 29, 1951 MRN:                [id number]   DATE OF PROCEDURE:  07/13/2018  PREOPERATIVE DIAGNOSES: 1.  Severe aortic stenosis 2.  Pre-heart valve surgery dental protocol 3.  Chronic apical periodontitis 4.  Dental caries 5.  Multiple flexure lesions 6.  Chronic periodontitis 7.  Loose teeth  POSTOPERATIVE DIAGNOSES: 1.  Severe aortic stenosis 2.  Pre-heart valve surgery dental protocol 3.  Chronic apical periodontitis 4.  Dental caries 5.  Multiple flexure lesions 6.  Chronic periodontitis 7.  Loose teeth  OPERATIONS: 1. Multiple extraction of tooth numbers 3, 8, 10, 23-26, 30, and 32 2.  3 Quadrants of alveoloplasty 3. Gross debridement of remaining dentition   SURGEON: Charlynne Pander, DDS  ASSISTANT: Pearletha Alfred (dental assistant)  ANESTHESIA: Monitored anesthesia care per anesthesia team.  MEDICATIONS: 1. Ancef 2 g IV prior to invasive dental procedures. 2. Local anesthesia with a total utilization of 6 carpules each containing 34 mg of lidocaine with 0.017 mg of epinephrine as well as 2 carpules each containing 9 mg of bupivacaine with 0.009 mg of epinephrine.  SPECIMENS: There are 9 teeth that were discarded.  DRAINS: None  CULTURES: None  COMPLICATIONS: None  ESTIMATED BLOOD LOSS: Less than 50 mLs.  INTRAVENOUS FLUIDS: 300 mLs of Lactated ringers solution.  INDICATIONS: The patient was recently diagnosed with severe aortic stenosis.  A medically necessary dental consultation was then requested to evaluate poor dentition.  The patient was examined and treatment planned for multiple extractions with alveoloplasty and gross debridement of remaining dentition in the operating room with general anesthesia.  The patient adamantly refused extraction of all remaining teeth and extraction of all maxillary teeth as treatment options.  The treatment plan formulated was formulated to  decrease the risks and complications associated with dental infection from affecting the patient's systemic health and the anticipated heart valve surgery.  OPERATIVE FINDINGS: Patient was examined operating room number 9.  The teeth were identified for extraction.  It was determined that tooth #3, 8, 10, 23-26, 30, and 32 would be extracted at this time.  The patient was noted be affected by chronic apical periodontitis, caries, multiple flexure lesions, chronic periodontitis, and loose teeth.   DESCRIPTION OF PROCEDURE: Patient was brought to the main operating room number 9. Patient was then placed in the supine position on the operating table.  Monitored anesthesia care was then induced per the anesthesia team. The patient was then prepped and draped in the usual manner for dental medicine procedure. A timeout was performed. The patient was identified and procedures were verified. The oral cavity was then thoroughly examined with the findings noted above. The patient was then ready for dental medicine procedure as follows:  Local anesthesia was then administered sequentially with a total utilization of 6 carpules each containing 34 mg of lidocaine with 0.017 mg of epinephrine as well as 2 carpules  each containing 9 mg bupivacaine with 0.009 mg of epinephrine.  The mandibular right and anterior quadrants were approached. The patient was given bilateral inferior alveolar nerve blocks and long buccal nerve blocks utilizing the bupivacaine with epinephrine. Further infiltration was then achieved utilizing the lidocaine with epinephrine. A 15 blade incision was then made from the mesial of #22 and extended to the mesial of #27.  A surgical flap was then  carefully reflected.  Tooth numbers 23, 24, 25, 26 were then removed with a 151 forceps without complications.  Alveoloplasty was then performed utilizing a ronguers and bone file to help achieve primary closure.  The surgical site was irrigated with  copious amounts sterile saline.  The tissues were approximated and trimmed appropriately.  A piece of Surgicel was then placed in the extraction sites appropriately.  Surgical site was then closed from the mesial #22 and extended to the mesial of #27 utilizing 3-0 chromic gut suture in a continuous interrupted suture technique x1.    At this point time tooth #32 was approached and it was removed with a 151 forceps without complication.  A 15 blade incision was then made from the mesial of #32 and extended to the distal of #29.  A surgical flap was then carefully reflected. Tooth number 30 was then elevated with a series straight elevators and removed with a 151 forceps without complications.  Alveoloplasty was then performed utilizing a ronguers and bone file.  The surgical sites were then irrigated with copious amounts sterile saline.  A piece of Surgicel was placed in the extraction socket #32 and #30 appropriately.  The surgical site area #32 was closed with a figure-of-eight suture technique and one 3-0 chromic at suture.  The surgical site was then closed from the mesial of #32 and extended to the distal #29 utilizing 3-0 chromic gut suture in a continuous interrupted suture technique x1.    At this point in time, the maxillary right and anterior quadrant was approached. Anesthesia was then delivered utilizing infiltration with lidocaine with epinephrine.  Tooth #3 approached and subluxated with a series straight elevators.  Tooth #3 was then removed with a 150 forceps without complications.  The socket was curetted and compressed appropriately.  Alveoloplasty was then performed utilizing a ronguers and bone file.  The surgical site was irrigated with copious amounts sterile saline.  A piece of Surgicel was then placed in the extraction socket.  Surgical site was then closed utilizing 3-0 chromic gut suture in continuous interrupted suture technique from the distal of #3 and extended to the mesial of #4.     The maxillary anterior area was then approached.  Tooth numbers 8 and 10 were subluxated with a series of straight elevators.  Forceps was then used to remove the bridge from tooth numbers 8 through 10.  This left the roots remaining.  The roots were then removed with a 150 forceps without complications.  The sockets were then curetted and compressed appropriately.  Sockets were then irrigated with copious amounts sterile saline.  A piece of Surgicel was placed in the extraction sockets appropriately.  The surgical extraction sites were then closed utilizing 2 separate 3-0 chromic gut material figure-of-eight sutures.  At this point time, the remaining dentition was approached.  A sonic scaler was used to remove accretions.  A series of hand curettes were then used to further remove accretions.  The Sonic scaler was then again used to further refine the removal of accretions.  This completed the gross debridement procedure.  At this point time, the entire mouth was irrigated with copious amounts of sterile saline. The patient was examined for complications, seeing none, the dental medicine procedure was deemed to be complete. The throat pack was removed at this time. An oral airway was then placed at the request of the anesthesia team. A series of 4 x 4 gauze were placed in the mouth to aid hemostasis. The  patient was then handed over to the anesthesia team for final disposition. After an appropriate amount of time, the patient was taken to the postanesthsia care unit in good condition. All counts were correct for the dental medicine procedure.  The patient is to have Lovenox therapy restarted this evening at 2200 hrs by the pharmacy team.   Charlynne Panderonald F. Lukisha Procida, DDS.

## 2018-07-13 NOTE — Progress Notes (Signed)
PRE-OPERATIVE NOTE:  07/13/2018 Joel Rogers [id number]  VITALS: BP 106/62 (BP Location: Right Arm)   Pulse 72   Temp 98.9 F (37.2 C) (Oral)   Resp 17   Ht 6' (1.829 m)   Wt 111.6 kg   SpO2 98%   BMI 33.37 kg/m   Lab Results  Component Value Date   WBC 7.8 07/09/2018   HGB 10.7 (L) 07/09/2018   HCT 32.6 (L) 07/09/2018   MCV 73.8 (L) 07/09/2018   PLT 192 07/09/2018   BMET    Component Value Date/Time   NA 138 07/11/2018 0232   NA 142 06/27/2018 1050   K 4.0 07/11/2018 0232   CL 96 (L) 07/11/2018 0232   CO2 34 (H) 07/11/2018 0232   GLUCOSE 111 (H) 07/11/2018 0232   BUN 17 07/11/2018 0232   BUN 14 06/27/2018 1050   CREATININE 1.13 07/11/2018 0232   CREATININE 1.04 05/01/2018 0913   CALCIUM 9.7 07/11/2018 0232   GFRNONAA >60 07/11/2018 0232   GFRNONAA 74 05/01/2018 0913   GFRAA >60 07/11/2018 0232   GFRAA 86 05/01/2018 0913    Lab Results  Component Value Date   INR 1.25 07/09/2018   INR 1.07 08/27/2016   INR 1.13 08/26/2016   No results found for: PTT   Joel Rogers presents for extraction of indicated teeth with alveoloplasty and gross debridement of remaining dentition in the operating room with general anesthesia.     SUBJECTIVE: The patient denies any acute medical or dental changes and agrees to proceed with treatment as planned.  The patient understands that the exact tooth numbers that will be extracted will be determined upon further examination in the operating room under general anesthesia.  EXAM: No sign of acute dental changes.  ASSESSMENT: Patient is affected by chronic apical periodontitis, dental caries, multiple flexure lesions, chronic periodontitis, and loose teeth.  PLAN: Patient agrees to proceed with treatment as planned in the operating room as previously discussed and accepts the risks, benefits, and complications of the proposed treatment. Patient is aware of the risk for bleeding, bruising, swelling, infection, pain, nerve  damage, sinus involvement, root tip fracture, mandible fracture, and the risks of complications associated with the anesthesia. Patient also is aware of the potential for other complications to and including death due to his overall cardiovascular and respiratory compromise.      Charlynne Panderonald F. Rae Sutcliffe, DDS

## 2018-07-13 NOTE — Anesthesia Procedure Notes (Signed)
Arterial Line Insertion Start/End8/22/2019 7:15 AM, 07/13/2018 7:20 AM Performed by: Beryle LatheBrock, Thomas E, MD, Dorie RankQuinn, Holly M, CRNA, CRNA  Preanesthetic checklist: patient identified, IV checked, risks and benefits discussed, surgical consent, monitors and equipment checked, pre-op evaluation and timeout performed Right, radial was placed Catheter size: 20 G Hand hygiene performed  and maximum sterile barriers used   Attempts: 1 Procedure performed without using ultrasound guided technique. Ultrasound Notes:anatomy identified Following insertion, dressing applied and Biopatch.

## 2018-07-13 NOTE — Discharge Instructions (Signed)

## 2018-07-13 NOTE — Anesthesia Postprocedure Evaluation (Signed)
Anesthesia Post Note  Patient: Joel Rogers  Procedure(s) Performed: Extraction of tooth #'s 3,8,10, 23-26, 30and 53 with alveoloplasty and gross debridement of remaining teeth (N/A Mouth)     Patient location during evaluation: PACU Anesthesia Type: MAC Level of consciousness: awake and alert Pain management: pain level controlled Vital Signs Assessment: post-procedure vital signs reviewed and stable Respiratory status: spontaneous breathing, nonlabored ventilation, respiratory function stable and patient connected to nasal cannula oxygen Cardiovascular status: blood pressure returned to baseline and stable Postop Assessment: no apparent nausea or vomiting Anesthetic complications: no    Last Vitals:  Vitals:   07/13/18 1016 07/13/18 1028  BP: (!) 142/79   Pulse: (!) 111   Resp: 12   Temp:  36.7 C  SpO2: 95%     Last Pain:  Vitals:   07/13/18 1015  TempSrc:   PainSc: [place]

## 2018-07-13 NOTE — Plan of Care (Signed)
  Problem: Health Behavior/Discharge Planning: Goal: Ability to manage health-related needs will improve Outcome: Progressing   Problem: Clinical Measurements: Goal: Ability to maintain clinical measurements within normal limits will improve Outcome: Progressing Goal: Will remain free from infection Outcome: Progressing Goal: Diagnostic test results will improve Outcome: Progressing Goal: Respiratory complications will improve Outcome: Progressing Goal: Cardiovascular complication will be avoided Outcome: Progressing   Problem: Coping: Goal: Level of anxiety will decrease Outcome: Progressing   Problem: Safety: Goal: Ability to remain free from injury will improve Outcome: Progressing   Problem: Skin Integrity: Goal: Risk for impaired skin integrity will decrease Outcome: Progressing   

## 2018-07-13 NOTE — Progress Notes (Addendum)
Advanced Heart Failure Rounding Note  PCP-Cardiologist: No primary care provider on file.   Subjective:    Weight down 5 more lbs. (32 pounds total).  No labs yet today. Went for teeth extractions with Dr Kristin Bruins this am.   Having a lot of oral pain. Denies SOB, orthopnea or BND. Bleeding from gums.   PFTs 07/10/18 - Completed, results pending.  R/LHC 07/10/18 Ao = 118/83 (104) LV = 207/30 RA = 17 RV = 87/13 PA = 91/34 (54) PCW = 28 Fick cardiac output/index = 10.3/4.4 PVR = 2.5 WU FA sat = 99% PA sat = 78%, 79%  Aortic valve: Mean gradient 67 mmHG  Peak gradient  AVA 1.06 cm2  Assessment:  1. Normal coronaries 2. Normal LV function EF 65% 3. Severe aortic stenosis 4. Severe pulmonary venous HTN with high-output state  Objective:   Weight Range: 111.6 kg Body mass index is 33.37 kg/m.   Vital Signs:   Temp:  [98.1 F (36.7 C)-99.1 F (37.3 C)] 98.6 F (37 C) (08/22 1049) Pulse Rate:  [72-139] 110 (08/22 1049) Resp:  [11-24] 15 (08/22 1212) BP: (92-142)/(55-92) 136/83 (08/22 1212) SpO2:  [93 %-100 %] 99 % (08/22 1028) Arterial Line BP: (105-134)/(51-80) 134/75 (08/22 1028) Weight:  [111.6 kg] 111.6 kg (08/22 0524) Last BM Date: 07/12/18  Weight change: Filed Weights   07/10/18 0500 07/11/18 0300 07/13/18 0524  Weight: 116.9 kg 114.2 kg 111.6 kg    Intake/Output:   Intake/Output Summary (Last 24 hours) at 07/13/2018 1250 Last data filed at 07/13/2018 1200 Gross per 24 hour  Intake 370 ml  Output 260 ml  Net 110 ml      Physical Exam    General:  No resp difficulty. HEENT: gauze in mouth with icepacks swollen anicteric  Neck: Supple. JVP 6-7. Carotids 2+ bilat; no bruits. No thyromegaly or nodule noted. Cor: PMI nondisplaced. IRR, 2/6 AS Lungs: CTAB, normal effort. Abdomen: obese Soft, non-tender, non-distended, no HSM. No bruits or masses. +BS  Extremities: no cyanosis, clubbing, rash, edema Neuro: alert & oriented x 3,  cranial nerves grossly intact. moves all 4 extremities w/o difficulty. Affect pleasant   Telemetry   Afib 80-110s personally reviewed.   EKG    No new tracings.    Labs    CBC No results for input(s): WBC, NEUTROABS, HGB, HCT, MCV, PLT in the last 72 hours. Basic Metabolic Panel Recent Labs    62/95/28 0232  NA 138  K 4.0  CL 96*  CO2 34*  GLUCOSE 111*  BUN 17  CREATININE 1.13  CALCIUM 9.7   Liver Function Tests No results for input(s): AST, ALT, ALKPHOS, BILITOT, PROT, ALBUMIN in the last 72 hours. No results for input(s): LIPASE, AMYLASE in the last 72 hours. Cardiac Enzymes No results for input(s): CKTOTAL, CKMB, CKMBINDEX, TROPONINI in the last 72 hours.  BNP: BNP (last 3 results) Recent Labs    06/10/18 2322 07/06/18 1810  BNP 685.0* 654.2*    ProBNP (last 3 results) No results for input(s): PROBNP in the last 8760 hours.   D-Dimer No results for input(s): DDIMER in the last 72 hours. Hemoglobin A1C No results for input(s): HGBA1C in the last 72 hours. Fasting Lipid Panel No results for input(s): CHOL, HDL, LDLCALC, TRIG, CHOLHDL, LDLDIRECT in the last 72 hours. Thyroid Function Tests No results for input(s): TSH, T4TOTAL, T3FREE, THYROIDAB in the last 72 hours.  Invalid input(s): FREET3  Other results:   Imaging   No  results found.  Medications:     Scheduled Medications: . atorvastatin  80 mg Oral q1800  . diltiazem  120 mg Oral Daily  . enoxaparin (LOVENOX) injection  60 mg Subcutaneous Q24H  . losartan  25 mg Oral Daily  . sodium chloride flush  3 mL Intravenous Q12H  . sodium chloride flush  3 mL Intravenous Q12H  . sodium chloride flush  3 mL Intravenous Q12H  . spironolactone  25 mg Oral Daily    Infusions: . sodium chloride    . sodium chloride    . sodium chloride    . lactated ringers      PRN Medications: sodium chloride, sodium chloride, sodium chloride, acetaminophen, morphine injection, ondansetron (ZOFRAN) IV,  oxyCODONE-acetaminophen, sodium chloride flush, sodium chloride flush, sodium chloride flush    Patient Profile   Joel Rogers is a 67 y.o. male with h/o morbid obesity, pulmonary HTN, chronic a fibrillation, sleep apnea, HTN, HLD, chronic venous insufficiency, chronic diastolic CHF, and severe Aortic stenosis.   Presented to [facility] 07/06/18 for L/RHC. Aborted due to resp distress requiring BiPAP in setting of A/C diastolic CHF and pulmonary hypertension.  Assessment/Plan   1. Acute on chronic diastolic (congestive) heart failure  - Decompensated in cath lab 07/06/18 requiring BiPAP and Nitro gtt for hypertensive emergency.  - Has diuresed 32 pounds. Much improved - BMET pending. May be able to restart PO today or tomorrow.  - Continue spiro 25 mg daily.  - Continue losartan 25 mg daily for BP.  - Normal coronaries but severe pulmonary venous HTN with high output state by cath 07/10/18. 2. Acute hypoxic resp distress - Resolved with Bipap and diuresis. On 2 L [place].  3. Hypertensive Emergency - BP has been stable 90-100s. Elevated today but has not gotten am meds yet.  4. Severe aortic stenosis - Echo March 2019 EF 60% - severe AS, AVA 0.88, peak 76 mmHg, pulmonary HTN- PA 92 mmHg - Echo 07/07/18 EF 65-70% Severe AS Mean gradient (S): 57 mm Hg. Peak(S): 100 mm Hg. RVSP - Severe aortic stenosis by [facility] 07/10/18.  - Will need TAVR. Structural team consulted. No change.  5. Morbid obesity  - Body mass index is 33.37 kg/m.  - h/o gastric bypass. Lost 140 lbs. No change.  6. Obstructive sleep apnea - OSA improved on sleep study 05/16/18, likely with weight loss.  - Has stated he no longer needs CPAP based on recent sleep study. No change.  7. Chronic atrial fibrillation  - Taken off anticoagulation secondary to SDH after a fall Dec 2017- - Rate improved with diuresis. Elevated currently with pain.  - Reconsider AC at some point. Now on hold for multiple possible procedures.  8. Poor  dentition - Seen by Dr. Kristin Bruins. Went for multiple teeth extractions this am.  9. Hypokalemia - BMET ordered for today. Pending.    Length of Stay: 7  Alford Highland, NP  07/13/2018, 12:50 PM  Advanced Heart Failure Team Pager [phone] (M-F; 7a - 4p)  Please contact [facility] for night-coverage after hours (4p -7a ) and weekends on [email or web address]   Patient seen and examined with the above-signed Advanced Practice Provider and/or Housestaff. I personally reviewed laboratory data, imaging studies and relevant notes. I independently examined the patient and formulated the important aspects of the plan. I have edited the note to reflect any of my changes or salient points. I have personally discussed the plan with the patient and/or family.  He is  s/p teeth extractions today. Have pain and bleeding but otherwise stable. Volume status much improved. Hold diuretics with poor po intake.   Arvilla Meresaniel Yanis Juma, MD  6:23 PM

## 2018-07-14 ENCOUNTER — Encounter (HOSPITAL_COMMUNITY): Payer: Self-pay | Admitting: Dentistry

## 2018-07-14 DIAGNOSIS — K08199 Complete loss of teeth due to other specified cause, unspecified class: Secondary | ICD-10-CM

## 2018-07-14 LAB — BASIC METABOLIC PANEL
Anion gap: 11 (ref 5–15)
BUN: 33 mg/dL — AB (ref 8–23)
CO2: 27 mmol/L (ref 22–32)
Calcium: 9 mg/dL (ref 8.9–10.3)
Chloride: 97 mmol/L — ABNORMAL LOW (ref 98–111)
Creatinine, Ser: 1.5 mg/dL — ABNORMAL HIGH (ref 0.61–1.24)
GFR calc Af Amer: 54 mL/min — ABNORMAL LOW (ref >60)
GFR, EST NON AFRICAN AMERICAN: 46 mL/min — AB (ref >60)
GLUCOSE: 92 mg/dL (ref 70–99)
POTASSIUM: 6.5 mmol/L — AB (ref 3.5–5.1)
Sodium: 135 mmol/L (ref 135–145)

## 2018-07-14 LAB — POTASSIUM: POTASSIUM: 4.3 mmol/L (ref 3.5–5.1)

## 2018-07-14 MED ORDER — BOOST / RESOURCE BREEZE PO LIQD CUSTOM
1.0000 | Freq: Two times a day (BID) | ORAL | Status: DC
  Administered 2018-07-15 (×2): 1 via ORAL

## 2018-07-14 MED ORDER — AMINOCAPROIC ACID SOLUTION 5% (50 MG/ML)
10.0000 mL | ORAL | Status: AC
  Administered 2018-07-14 (×8): 10 mL via ORAL
  Filled 2018-07-14: qty 100

## 2018-07-14 NOTE — Progress Notes (Signed)
CARDIAC REHAB PHASE I   PRE:  Rate/Rhythm: 98 afib  BP:  Supine:   Sitting: 140/95  Standing:    SaO2: 89%RA  MODE:  Ambulation: 260 ft   POST:  Rate/Rhythm: 108 afib  BP:  Supine:   Sitting: 160/101  Standing:    SaO2: 89-90%RA 1400-1435 Pt assisted to bathroom and then he walked 260 ft on RA with rolling walker and steady gait. Tolerated well. No c/o dizziness. Back to recliner after walk. Visitor in room. Has walker at home for use.  Luetta Nuttingharlene Jadelin Eng, RN BSN  07/14/2018 2:31 PM

## 2018-07-14 NOTE — Consult Note (Addendum)
[facility]  MULTIDISCIPLINARY HEART VALVE TEAM  CARDIOTHORACIC SURGERY CONSULTATION REPORT  Referring Provider is Glori Bickers, MD PCP is Orlena Sheldon, PA-C  Reason for Consultation: Severe symptomatic aortic stenosis   HPI:  The patient is a 67 year old gentleman with a history of hypertension, hyperlipidemia, diabetes, chronic atrial fibrillation, morbid obesity status post gastric bypass surgery, sleep apnea on CPAP, pulmonary hypertension, chronic diastolic congestive heart failure, and severe aortic stenosis he was referred for consideration of transcatheter aortic valve replacement.  He has been followed for aortic stenosis for several years.  An echocardiogram on 02/09/2018 showed severe aortic stenosis with a mean gradient of 40 mmHg and a peak gradient of 76 mmHg.  The dimensionless index was 0.28.  Left ventricular ejection fraction was 60 to 65% with grade 3 diastolic dysfunction.  There was no mitral stenosis or regurgitation.  He was seen by Dr. Angelena Form in clinic for consideration of transcatheter aortic valve replacement and was scheduled for right and left heart catheterization on 07/06/2018.  Catheterization showed severe elevation of his right heart pressures with a mean right atrial pressure of 26 mmHg and RV end-diastolic pressure of 24 mmHg.  Due to the size of his atrium was not possible to pass the pulmonary artery catheter out into the pulmonary artery.  The case was aborted after the right heart cath due to respiratory distress during the procedure.  With his severe pulmonary hypertension documented by previous echocardiogram he was admitted to the advanced heart failure team for optimization and diuresis.  A repeat echocardiogram on 07/07/2018 showed progression of his aortic stenosis with a rise in the mean gradient to 57 mmHg and a peak gradient of 100 mmHg.  The right ventricle was mildly dilated.  Left ventricular ejection fraction was 65 to 70%.  He  subsequently underwent repeat right and left heart cath on 07/10/2018.  This showed normal coronary arteries.  There is severe pulmonary hypertension with a PA pressure of 91/34 and a wedge pressure of 28.  Cardiac index was 4.4.  Pulmonary vascular resistance was 2.5 Wood units.  PA sat was 79% with a peripheral side of 99%.  Right atrial pressure was 17.  The LVEDP was 30.  Optimization with diuresis was continued by the heart failure team.  Due to bad teeth he was evaluated by dental medicine and underwent extraction of multiple teeth earlier today.  The patient reports worsening dyspnea on exertion and lower extremity edema prior to admission.  He has not had any chest discomfort.  Denies dizziness and syncope.  Past Medical History:  Diagnosis Date  . Arthritis   . Atrial fibrillation, chronic ([place])   . Chronic diastolic CHF (congestive heart failure) ([place]) 06/15/2018  . Diabetes mellitus without complication (HCC)    borderline  . Hyperlipidemia   . Hypertension   . Morbid obesity ([place])   . Normal coronary arteries    by cardiac catheterization performed 03/14/06  . Obesity   . Sleep apnea    on CPAP  . Venous insufficiency     Past Surgical History:  Procedure Laterality Date  . CRANIOTOMY Right 08/27/2016   Procedure: CRANIOTOMY HEMATOMA EVACUATION SUBDURAL;  Surgeon: Erline Levine, MD;  Location: [facility];  Service: Neurosurgery;  Laterality: Right;  . GASTRIC BYPASS  09/2008  . JOINT REPLACEMENT  08/31/2006   right hip  . MULTIPLE EXTRACTIONS WITH ALVEOLOPLASTY N/A 07/13/2018   Procedure: Extraction of tooth #'s 3,8,10, 23-26, 30and 32 with alveoloplasty and gross debridement  of remaining teeth;  Surgeon: Lenn Cal, DDS;  Location: [facility];  Service: Oral Surgery;  Laterality: N/A;  . RIGHT HEART CATH N/A 07/06/2018   Procedure: RIGHT HEART CATH;  Surgeon: Burnell Blanks, MD;  Location: [facility];  Service: Cardiovascular;  Laterality: N/A;  . RIGHT/LEFT  HEART CATH AND CORONARY ANGIOGRAPHY N/A 07/10/2018   Procedure: RIGHT/LEFT HEART CATH AND CORONARY ANGIOGRAPHY;  Surgeon: Jolaine Artist, MD;  Location: [facility];  Service: Cardiovascular;  Laterality: N/A;  . TOTAL HIP ARTHROPLASTY     right hip    Family History  Problem Relation Age of Onset  . Hypertension Father   . Cancer Father        prob prostate  . Alzheimer's disease Mother   . Alzheimer's disease Maternal Grandfather   . Breast cancer Sister   . Cancer Brother        "brain tumor"    Social History   Socioeconomic History  . Marital status: Married    Spouse name: [name]  . Number of children: 2  . Years of education: 74  . Highest education level: Not on file  Occupational History  . 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 Financial resource strain: Not on file  . Food insecurity:    Worry: Not on file    Inability: Not on file  . Transportation needs:    Medical: Not on file    Non-medical: Not on file  Tobacco Use  . Smoking status: Never Smoker  . Smokeless tobacco: Never Used  Substance and Sexual Activity  . Alcohol use: No  . Drug use: No  . Sexual activity: Not Currently  Lifestyle  . Physical activity:    Days per week: Not on file    Minutes per session: Not on file  . Stress: Not on file  Relationships  . Social connections:    Talks on phone: Not on file    Gets together: Not on file    Attends religious service: Not on file    Active member of club or organization: Not on file    Attends meetings of clubs or organizations: Not on file    Relationship status: Not on file  . Intimate partner violence:    Fear of current or ex partner: Not on file    Emotionally abused: Not on file    Physically abused: Not on file    Forced sexual activity: Not on file  Other Topics Concern  . Not on file  Social History Narrative   Doctorate in ministry   Retired  Therapist, occupational   Disability from hip replacement   Lives with wife [name]   Exercises at the [employer] - water exercise    Current Facility-Administered Medications  Medication Dose Route Frequency Provider Last Rate Last Dose  . 0.9 %  sodium chloride infusion  250 mL Intravenous PRN Teena Dunk F, DDS      . 0.9 %  sodium chloride infusion  250 mL Intravenous PRN Teena Dunk F, DDS      . 0.9 %  sodium chloride infusion  250 mL Intravenous PRN Bensimhon, Shaune Pascal, MD      . acetaminophen (TYLENOL) tablet 650 mg  650 mg Oral Q4H PRN Teena Dunk F, DDS      . aminocaproic acid (AMICAR) oral solution 50 mg/mL (5%), 100 ml  10 mL Oral Q1H Kulinski,  Pete Glatter, DDS   10 mL at 07/14/18 1120  . atorvastatin (LIPITOR) tablet 80 mg  80 mg Oral q1800 Lenn Cal, DDS   80 mg at 07/13/18 1750  . diltiazem (CARDIZEM CD) 24 hr capsule 120 mg  120 mg Oral Daily Teena Dunk F, DDS   120 mg at 07/14/18 0930  . enoxaparin (LOVENOX) injection 60 mg  60 mg Subcutaneous Q24H Lenn Cal, DDS   60 mg at 07/13/18 2148  . feeding supplement (BOOST / RESOURCE BREEZE) liquid 1 Container  1 Container Oral BID BM Bensimhon, Shaune Pascal, MD      . lactated ringers infusion   Intravenous Continuous Lenn Cal, DDS 10 mL/hr at 07/14/18 1000    . losartan (COZAAR) tablet 25 mg  25 mg Oral Daily Teena Dunk F, DDS   25 mg at 07/14/18 0930  . morphine 2 MG/ML injection 2-4 mg  2-4 mg Intravenous Q1H PRN Georgiana Shore, NP   4 mg at 07/14/18 1353  . ondansetron (ZOFRAN) injection 4 mg  4 mg Intravenous Q6H PRN Lenn Cal, DDS      . oxyCODONE-acetaminophen (PERCOCET/ROXICET) 5-325 MG per tablet 1-2 tablet  1-2 tablet Oral Q6H PRN Lenn Cal, DDS   2 tablet at 07/14/18 [phone]  . sodium chloride flush (NS) 0.9 % injection 3 mL  3 mL Intravenous Q12H Teena Dunk F, DDS   3 mL at 07/13/18 2149  . sodium chloride flush (NS) 0.9 % injection 3 mL  3 mL Intravenous PRN  Teena Dunk F, DDS      . sodium chloride flush (NS) 0.9 % injection 3 mL  3 mL Intravenous Q12H Teena Dunk F, DDS   3 mL at 07/13/18 2149  . sodium chloride flush (NS) 0.9 % injection 3 mL  3 mL Intravenous PRN Teena Dunk F, DDS   3 mL at 07/12/18 1117  . sodium chloride flush (NS) 0.9 % injection 3 mL  3 mL Intravenous Q12H Bensimhon, Shaune Pascal, MD   3 mL at 07/13/18 2148  . sodium chloride flush (NS) 0.9 % injection 3 mL  3 mL Intravenous PRN Bensimhon, Shaune Pascal, MD      . spironolactone (ALDACTONE) tablet 25 mg  25 mg Oral Daily Teena Dunk F, DDS   25 mg at 07/14/18 0930    No Known Allergies    Review of Systems:   General:  normal appetite, decreased energy, no weight gain, no weight loss, no fever  Cardiac:  no chest pain with exertion, no chest pain at rest, +SOB with  exertion, no resting SOB, no PND, no orthopnea, no palpitations, + arrhythmia, + atrial fibrillation, + LE edema, no dizzy spells, no syncope  Respiratory:  exertional shortness of breath, no home oxygen, no productive cough, no dry cough, no bronchitis, no wheezing, no hemoptysis, no asthma, no pain with inspiration or cough, + sleep apnea, + CPAP at night  GI:   no difficulty swallowing, no reflux, no frequent heartburn, no hiatal hernia, no abdominal pain, no constipation, no diarrhea, no hematochezia, no hematemesis, no melena  GU:   no dysuria,  no frequency, no urinary tract infection, no hematuria, no enlarged prostate, no kidney stones, no kidney disease  Vascular:  no pain suggestive of claudication, no pain in feet, no leg cramps, no varicose veins, no DVT, no non-healing foot ulcer  Neuro:   no stroke, no TIA's, no seizures, no headaches, no temporary blindness one eye,  no slurred speech, no peripheral neuropathy, no chronic pain, no instability of gait, no memory/cognitive dysfunction  Musculoskeletal: no arthritis, no joint swelling, no myalgias, no difficulty walking, normal mobility    Skin:   no rash, no itching, no skin infections, no pressure sores or ulcerations  Psych:   no anxiety, no depression, no nervousness, no unusual recent stress  Eyes:   no blurry vision, no floaters, no recent vision changes, does not wear glasses or contacts  ENT:   no hearing loss, no loose or painful teeth, no dentures, Had extractions today  Hematologic:  no easy bruising, no abnormal bleeding, no clotting disorder, no frequent epistaxis  Endocrine:  + diabetes, does not check CBG's at home           Physical Exam:   BP 130/84 (BP Location: Left Arm)   Pulse 72   Temp 98.3 F (36.8 C) (Oral)   Resp 17   Ht 6' (1.829 m)   Wt 112 kg   SpO2 92%   BMI 33.49 kg/m   General:  Obese, looks uncomfortable after extractions.  HEENT:  Unremarkable, NCAT, PERLA, mouth packed with gauze.  Neck:   no JVD, no bruits, no adenopathy or thyromegaly  Chest:   clear to auscultation, symmetrical breath sounds, no wheezes, no rhonchi   CV:   RRR, grade III/VI crescendo/decrescendo murmur heard best at RSB,  no diastolic murmur  Abdomen:  soft, non-tender, no masses or organomegaly, large panniculus  Extremities:  warm, well-perfused, pulses palpable in feet, mild LE edema  Rectal/GU  Deferred  Neuro:   Grossly non-focal and symmetrical throughout  Skin:   Clean and dry, no rashes, no breakdown   Diagnostic Tests:  Result status: Final result                              *[place]*                   *[facility]*                         [address], [place] [address]                            [phone]  ------------------------------------------------------------------- Transthoracic Echocardiography  Patient:    Jaymes, Hang MR #:       [id number] Study Date: 07/07/2018 Gender:     M Age:        78 Height:     182.9 cm Weight:     124.5 kg BSA:        2.56 m^2 Pt. Status: Room:       2C08C   ATTENDING     Sonny Dandy, Christopher  REFERRING    [place], Christopher  ADMITTING    Bensimhon, Daniel  PERFORMING   [facility], Inpatient  SONOGRAPHER  [facility]  cc:  ------------------------------------------------------------------- LV EF: 65% -   70%  ------------------------------------------------------------------- Indications:      Aortic stenosis 424.1.  ------------------------------------------------------------------- History:   PMH:  Pulmonary hypertension Acute kidney injury Syncope.  Congestive heart failure.  Risk factors:  Hypertension. Dyslipidemia.  ------------------------------------------------------------------- Study Conclusions  -  Left ventricle: The cavity size was normal. Wall thickness was   increased in a pattern of moderate LVH. Systolic function was   vigorous. The estimated ejection fraction was in the range of 65%   to 70%. Wall motion was normal; there were no regional wall   motion abnormalities. The study was not technically sufficient to   allow evaluation of LV diastolic dysfunction due to atrial   fibrillation. - Aortic valve: Trileaflet; severely calcified leaflets. There was   severe stenosis. There was trivial regurgitation. Mean gradient   (S): 57 mm Hg. Peak gradient (S): 100 mm Hg. Valve area (VTI):   0.97 cm^2. - Mitral valve: Mildly calcified annulus. There was trivial   regurgitation. - Left atrium: The atrium was severely dilated. - Right ventricle: The cavity size was mildly dilated. Systolic   function was normal. - Right atrium: The atrium was severely dilated. - Tricuspid valve: Peak RV-RA gradient (S): 51 mm Hg. - Pulmonary arteries: PA peak pressure: 66 mm Hg (S). - Systemic veins: IVC measured 3.4 cm with < 50% respirophasic   variation, suggesting RA pressure 15 mmHg.  Impressions:  - The patient was in atrial fibrillation. Normal LV size with   moderate LV hypertrophy. EF 65-70%.  Mildly dilated RV with normal   systolic function. Severe aortic stenosis. Severe biatrial   enlargement. Moderate pulmonary hypertension. Dilated IVC   suggestive of elevated RV filling pressure.  ------------------------------------------------------------------- Study data:  Comparison was made to the study of 02/09/2018.  Study status:  Routine.  Procedure:  The patient reported no pain pre or post test. Transthoracic echocardiography. Image quality was adequate.  Study completion:  There were no complications. Transthoracic echocardiography.  M-mode, complete 2D, spectral Doppler, and color Doppler.  Birthdate:  Patient birthdate: 1951/03/01.  Age:  Patient is 67 yr old.  Sex:  Gender: male. BMI: 37.2 kg/m^2.  Blood pressure:     127/75  Patient status: Inpatient.  Study date:  Study date: 07/07/2018. Study time: 09:29 AM.  Location:  ICU/CCU  -------------------------------------------------------------------  ------------------------------------------------------------------- Left ventricle:  The cavity size was normal. Wall thickness was increased in a pattern of moderate LVH. Systolic function was vigorous. The estimated ejection fraction was in the range of 65% to 70%. Wall motion was normal; there were no regional wall motion abnormalities. The study was not technically sufficient to allow evaluation of LV diastolic dysfunction due to atrial fibrillation.   ------------------------------------------------------------------- Aortic valve:   Trileaflet; severely calcified leaflets.  Doppler:  There was severe stenosis.   There was trivial regurgitation. VTI ratio of LVOT to aortic valve: 0.34. Valve area (VTI): 0.97 cm^2. Indexed valve area (VTI): 0.38 cm^2/m^2. Peak velocity ratio of LVOT to aortic valve: 0.39. Valve area (Vmax): 1.1 cm^2. Indexed valve area (Vmax): 0.43 cm^2/m^2. Mean velocity ratio of LVOT to aortic valve: 0.38. Valve area (Vmean): 1.09 cm^2. Indexed  valve area (Vmean): 0.43 cm^2/m^2.    Mean gradient (S): 57 mm Hg. Peak gradient (S): 100 mm Hg.  ------------------------------------------------------------------- Aorta:  Aortic root: The aortic root was normal in size. Ascending aorta: The ascending aorta was normal in size.  ------------------------------------------------------------------- Mitral valve:   Mildly calcified annulus.  Doppler:   There was no evidence for stenosis.   There was trivial regurgitation.    Valve area by pressure half-time: 4.58 cm^2. Indexed valve area by pressure half-time: 1.79 cm^2/m^2.    Peak gradient (D): 12 mm Hg.   ------------------------------------------------------------------- Left atrium:  The atrium was severely dilated.  -------------------------------------------------------------------  Right ventricle:  The cavity size was mildly dilated. Systolic function was normal.  ------------------------------------------------------------------- Pulmonic valve:    Structurally normal valve.   Cusp separation was normal.  Doppler:  Transvalvular velocity was within the normal range. There was trivial regurgitation.  ------------------------------------------------------------------- Tricuspid valve:   Doppler:  There was mild regurgitation.  ------------------------------------------------------------------- Right atrium:  The atrium was severely dilated.  ------------------------------------------------------------------- Pericardium:  There was no pericardial effusion.  ------------------------------------------------------------------- Systemic veins:  IVC measured 3.4 cm with < 50% respirophasic variation, suggesting RA pressure 15 mmHg.  ------------------------------------------------------------------- Measurements   Left ventricle                           Value          Reference  LV ID, ED, PLAX chordal          (L)     42.5  mm       43 - 52  LV ID, ES, PLAX  chordal                  23.7  mm       23 - 38  LV fx shortening, PLAX chordal           44    %        >=29  LV PW thickness, ED                      19.2  mm       ----------  IVS/LV PW ratio, ED                      0.94           <=1.3  Stroke volume, 2D                        106   ml       ----------  Stroke volume/bsa, 2D                    41    ml/m^2   ----------    Ventricular septum                       Value          Reference  IVS thickness, ED                        18    mm       ----------    LVOT                                     Value          Reference  LVOT ID, S                               19    mm       ----------  LVOT area                                2.84  cm^2     ----------  LVOT peak velocity, S  194   cm/s     ----------  LVOT mean velocity, S                    138   cm/s     ----------  LVOT VTI, S                              33.8  cm       ----------  LVOT peak gradient, S                    15    mm Hg    ----------  Stroke volume (SV), LVOT DP              95.8  ml       ----------  Stroke index (SV/bsa), LVOT DP           37.4  ml/m^2   ----------    Aortic valve                             Value          Reference  Aortic valve peak velocity, S            501   cm/s     ----------  Aortic valve mean velocity, S            359   cm/s     ----------  Aortic valve VTI, S                      99.3  cm       ----------  Aortic mean gradient, S                  57    mm Hg    ----------  Aortic peak gradient, S                  100   mm Hg    ----------  VTI ratio, LVOT/AV                       0.34           ----------  Aortic valve area, VTI                   0.97  cm^2     ----------  Aortic valve area/bsa, VTI               0.38  cm^2/m^2 ----------  Velocity ratio, peak, LVOT/AV            0.39           ----------  Aortic valve area, peak velocity         1.1   cm^2     ----------  Aortic valve area/bsa, peak               0.43  cm^2/m^2 ----------  velocity  Velocity ratio, mean, LVOT/AV            0.38           ----------  Aortic valve area, mean velocity         1.09  cm^2     ----------  Aortic valve area/bsa, mean  0.43  cm^2/m^2 ----------  velocity    Aorta                                    Value          Reference  Aortic root ID, ED                       29    mm       ----------    Left atrium                              Value          Reference  LA ID, A-P, ES                           77    mm       ----------  LA ID/bsa, A-P                   (H)     3.01  cm/m^2   <=2.2  LA volume, S                             287   ml       ----------  LA volume/bsa, S                         112   ml/m^2   ----------  LA volume, ES, 1-p A4C                   260   ml       ----------  LA volume/bsa, ES, 1-p A4C               101.5 ml/m^2   ----------  LA volume, ES, 1-p A2C                   305   ml       ----------  LA volume/bsa, ES, 1-p A2C               119.1 ml/m^2   ----------    Mitral valve                             Value          Reference  Mitral E-wave peak velocity              175   cm/s     ----------  Mitral deceleration time                 164   ms       150 - 230  Mitral pressure half-time                48    ms       ----------  Mitral peak gradient, D                  12    mm Hg    ----------  Mitral valve area, PHT, DP  4.58  cm^2     ----------  Mitral valve area/bsa, PHT, DP           1.79  cm^2/m^2 ----------    Pulmonary arteries                       Value          Reference  PA pressure, S, DP               (H)     66    mm Hg    <=30    Tricuspid valve                          Value          Reference  Tricuspid regurg peak velocity           358   cm/s     ----------  Tricuspid peak RV-RA gradient            51    mm Hg    ----------    Right atrium                             Value          Reference  RA ID, S-I, ES, A4C              (H)      85.8  mm       34 - 49  RA area, ES, A4C                 (H)     37.6  cm^2     8.3 - 19.5  RA volume, ES, A/L                       139   ml       ----------  RA volume/bsa, ES, A/L                   54.3  ml/m^2   ----------    Systemic veins                           Value          Reference  Estimated CVP                            15    mm Hg    ----------    Right ventricle                          Value          Reference  TAPSE                                    11.5  mm       ----------  RV s&', lateral, S                        10.1  cm/s     ----------  Legend: (L)  and  (H)  mark values outside specified reference range.  ------------------------------------------------------------------- Prepared and Electronically  Authenticated by  Loralie Champagne, M.D. 2019-08-16T14:59:32    Lissa Morales  CARDIAC CATHETERIZATION  Order# [id number]  Reading physician: Jolaine Artist, MD Ordering physician: Jolaine Artist, MD Study date: 07/10/18  Physicians   Panel Physicians Referring Physician Case Authorizing Physician  Bensimhon, Shaune Pascal, MD (Primary)    Procedures   RIGHT/LEFT HEART CATH AND CORONARY ANGIOGRAPHY  Conclusion     Findings:  Ao = 118/83 (104) LV = 207/30 RA = 17 RV = 87/13 PA = 91/34 (54) PCW = 28 Fick cardiac output/index = 10.3/4.4 PVR = 2.5 WU FA sat = 99% PA sat = 78%, 79%  Aortic valve: Mean gradient 67 mmHG  Peak gradient 58mHG  AVA 1.06 cm2  Assessment:  1. Normal coronaries 2. Normal LV function EF 65% 3. Severe aortic stenosis 4. Severe pulmonary venous HTN with high-output state  Plan/Discussion:  Continue diuresis. Continue w/u for TAVR.   DGlori Bickers MD  8:40 AM     Indications   Severe aortic stenosis [I35.0 (ICD-10-CM)]  Procedural Details/Technique   Technical Details The risks and indication of the procedure were explained. Consent was signed and placed on the chart. An  appropriate timeout was taken prior to the procedure.   The right AC fossa was prepped and draped in the routine sterile fashion and anesthetized with 1% local lidocaine. Using a micropuncture kit and u/s guidance a 5 FR venous sheath was placed in the right brachial vein using a modified Seldinger technique. A standard Swan-Ganz catheter was used for the procedure.   After a normal Allen's test was confirmed, the right wrist was prepped and draped in the routine sterile fashion and anesthetized with 1% local lidocaine. A 5 FR arterial sheath was then placed in the right radial artery using a modified Seldinger technique. '3mg'$  IV verapamil was given through the sheath. Systemic heparin was administered. Standard catheters including a JL 3.5 and a JR 4 were used. All catheter exchanges were made over a wire. We were able to cross the aortic valve with a JR4 and standard Inquirer wire.   Estimated blood loss <50 mL.  During this procedure the patient was administered the following to achieve and maintain moderate conscious sedation: Versed 2 mg, Fentanyl 25 mcg, while the patient's heart rate, blood pressure, and oxygen saturation were continuously monitored. The period of conscious sedation was 27 minutes, of which I was present face-to-face 100% of this time.  Coronary Findings   Diagnostic  Dominance: Co-dominant  Left Main  Vessel is angiographically normal.  Left Anterior Descending  Vessel is angiographically normal.  Left Circumflex  Vessel is angiographically normal.  Right Coronary Artery  Vessel is angiographically normal.  Intervention   No interventions have been documented.  Wall Motion   Resting    65%        Coronary Diagrams   Diagnostic Diagram       Implants    No implant documentation for this case.  MERGE Images   Show images for CARDIAC CATHETERIZATION   Link to Procedure Log   Procedure Log    Hemo Data    Most Recent Value  Fick Cardiac Output  10.33 L/min  Fick Cardiac Output Index 4.35 (L/min)/BSA  Aortic Mean Gradient 66.5 mmHg  Aortic Peak Gradient 92 mmHg  Aortic Valve Area 1.06  Aortic Value Area Index 0.45 cm2/BSA  RA A Wave -99 mmHg  RA V Wave 21 mmHg  RA Mean 17 mmHg  RV Systolic Pressure  87 mmHg  RV Diastolic Pressure 4 mmHg  RV EDP 13 mmHg  PA Systolic Pressure 91 mmHg  PA Diastolic Pressure 34 mmHg  PA Mean 54 mmHg  PW A Wave -99 mmHg  PW V Wave 31 mmHg  PW Mean 28 mmHg  AO Systolic Pressure 119 mmHg  AO Diastolic Pressure 83 mmHg  AO Mean 147 mmHg  LV Systolic Pressure 829 mmHg  LV Diastolic Pressure 17 mmHg  LV EDP 30 mmHg  AOp Systolic Pressure 562 mmHg  AOp Diastolic Pressure 78 mmHg  AOp Mean Pressure 93 mmHg  LVp Systolic Pressure 130 mmHg  LVp Diastolic Pressure 20 mmHg  LVp EDP Pressure 29 mmHg  QP/QS 1  TPVR Index 12.4 HRUI  TSVR Index 23.89 HRUI  PVR SVR Ratio 0.3  TPVR/TSVR Ratio 0.52   ADDENDUM REPORT: 07/11/2018 13:42  EXAM: OVER-READ INTERPRETATION  CT CHEST  The following report is an over-read performed by radiologist Dr. Rebekah Chesterfield [facility] on 07/11/2018. This over-read does not include interpretation of cardiac or coronary anatomy or pathology. The coronary CTA interpretation by the cardiologist is attached.  COMPARISON:  None.  FINDINGS: Extracardiac findings will be described separately under dictation for contemporaneously obtained CTA chest, abdomen and pelvis.  IMPRESSION: Please see separate dictation for contemporaneously obtained CTA chest, abdomen and pelvis dated 07/11/2018 for full description of extracardiac findings.   Electronically Signed   By: Vinnie Langton M.D.   On: 07/11/2018 13:42   Addended by Etheleen Mayhew, MD on 07/11/2018 1:44 PM    Study Result   CLINICAL DATA:  Aortic Stenosis  EXAM: Cardiac TAVR CT  TECHNIQUE: The patient was scanned on a Siemens Force 865 slice scanner. A 120 kV  retrospective scan was triggered in the ascending thoracic aorta at 140 HU's. Gantry rotation speed was 250 msecs and collimation was .6 mm. No beta blockade or nitro were given. The 3D data set was reconstructed in 5% intervals of the R-R cycle. Systolic and diastolic phases were analyzed on a dedicated work station using MPR, MIP and VRT modes. The patient received 80 cc of contrast.  FINDINGS: Aortic Valve: Trileaflet severely calcified with restricted motion  Aorta: Calcific atherosclerosis with no aneurysm  Sino-tubular Junction: 25 mm  Ascending Thoracic Aorta: 31 mm  Aortic Arch: 29 mm  Descending Thoracic Aorta: 25 mm  Sinus of Valsalva Measurements:  Non-coronary: 29.2 mm  Right - coronary: 27.8 mm  Left -   coronary: 29.4 mm  Coronary Artery Height above Annulus:  Left Main: 13 mm above annulus  Right Coronary: 11.2 mm above annulus  Virtual Basal Annulus Measurements:  Maximum / Minimum Diameter: 23.5 mm x 26.5 mm  Perimeter: 81.2 mm  Area: 536 mm2  Coronary Arteries: Sufficient height above annulus for deployment  Optimum Fluoroscopic Angle for Delivery: LAO 13 degrees Caudal 4 degrees  IMPRESSION: 1. Calcified tri leaflet AV with annular area of 536 mm2 suitable for a 26 mm Sapien 3 valve  2. Optimum angiographic angle for deployment LAO 13 degrees Caudal 4 degrees  3.  Coronary arteries sufficient height above annulus for deployment  4.  Severe bi atrial enlargement cannot r/o laminated LAA thrombus  5.  Normal aortic root 3.1 cm  Jenkins Rouge  Electronically Signed: By: Jenkins Rouge M.D. On: 07/11/2018 13:09      CLINICAL DATA:  67 year old male with history of severe aortic stenosis. Preprocedural study prior to potential transcatheter aortic valve replacement (TAVR) procedure.  EXAM: CT  ANGIOGRAPHY CHEST, ABDOMEN AND PELVIS  TECHNIQUE: Multidetector CT imaging through the chest, abdomen and  pelvis was performed using the standard protocol during bolus administration of intravenous contrast. Multiplanar reconstructed images and MIPs were obtained and reviewed to evaluate the vascular anatomy.  CONTRAST:  154m ISOVUE-370 IOPAMIDOL (ISOVUE-370) INJECTION 76%  COMPARISON:  No priors.  FINDINGS: CTA CHEST FINDINGS  Cardiovascular: Heart size is severely enlarged with severe left atrial dilatation. There is no significant pericardial fluid, thickening or pericardial calcification. There is aortic atherosclerosis, as well as atherosclerosis of the great vessels of the mediastinum and the coronary arteries, including calcified atherosclerotic plaque in the left main, left anterior descending, left circumflex and right coronary arteries. Severe calcifications of the aortic valve. Moderate calcifications of the mitral annulus. Dilatation of the pulmonic trunk (4.5 cm in diameter).  Mediastinum/Lymph Nodes: No pathologically enlarged mediastinal or hilar lymph nodes. Esophagus is unremarkable in appearance. No axillary lymphadenopathy.  Lungs/Pleura: Trace right pleural effusion lying dependently. Several small pulmonary nodules are noted in the right lung, largest of which is in the medial aspect of the right lower lobe measuring 5 mm (axial image 78 of series 15). No acute consolidative airspace disease.  Musculoskeletal/Soft Tissues: There are no aggressive appearing lytic or blastic lesions noted in the visualized portions of the skeleton.  CTA ABDOMEN AND PELVIS FINDINGS  Hepatobiliary: Liver has a nodular contour, suggesting underlying cirrhosis. No suspicious cystic or solid hepatic lesions. No intra or extrahepatic biliary ductal dilatation. Gallbladder is moderately distended, but otherwise unremarkable in appearance.  Pancreas: No pancreatic mass. No pancreatic or peripancreatic fluid or inflammatory changes.  Spleen:  Unremarkable.  Adrenals/Urinary Tract: 2 mm nonobstructive calculus in the interpolar collecting system of the right kidney. Extensive scarring in the right kidney. 3 mm nonobstructive calculus in the lower pole collecting system of the left kidney. Subcentimeter low-attenuation lesion in the interpolar region of the left kidney, too small to characterize, but statistically likely to represent a cyst. No hydroureteronephrosis. Urinary bladder is largely obscured by beam hardening artifact fact from the patient's right hip arthroplasty, but is unremarkable in appearance.  Stomach/Bowel: status post Roux-en-Y gastric bypass. No pathologic dilatation of small bowel or colon. Numerous colonic diverticulae are noted, particularly in the descending colon and sigmoid colon, without surrounding inflammatory changes to suggest an acute diverticulitis at this time. Normal appendix.  Vascular/Lymphatic: Aortic atherosclerosis, with vascular findings and measurements pertinent to potential TAVR procedure, as detailed below. Please note, that the extensive beam hardening artifact in the associated image noise does limit assessment of this examination (as detailed below). No lymphadenopathy identified in the abdomen or pelvis.  Reproductive: Prostate gland and seminal vesicles are completely obscured by beam hardening artifact.  Other: No significant volume of ascites.  No pneumoperitoneum.  Musculoskeletal: Status post right hip arthroplasty. There are no aggressive appearing lytic or blastic lesions noted in the visualized portions of the skeleton.  VASCULAR MEASUREMENTS PERTINENT TO TAVR:  AORTA:  Minimal Aortic Diameter-19 x 17 mm  Severity of Aortic Calcification-mild  RIGHT PELVIS:  Right Common Iliac Artery -  Minimal Diameter-9.7 x 9.9 mm  Tortuosity-mild  Calcification - moderate  Right External Iliac Artery -  Comment: A portion of the distal vessel  is completely obscurred by beam hardening artifact from patient's right hip arthroplasty, and cannot be adequately evaluated.  Minimal Diameter-8.3 x 7.3 mm  Tortuosity-moderate  Calcification - moderate  Right Common Femoral Artery -  Comment: A portion of the proximal vessel is completely  obscurred by beam hardening artifact from patient's right hip arthroplasty, and cannot be adequately evaluated.  Minimal Diameter-9.3 x 6.0 mm  Tortuosity-mild  Calcification - moderate  LEFT PELVIS:  Left Common Iliac Artery -  Minimal Diameter-11.2 x 9.0 mm  Tortuosity-mild  Calcification - moderate  Left External Iliac Artery -  Minimal Diameter-8.7 x 7.2 mm  Tortuosity-moderate  Calcification - moderate  Left Common Femoral Artery -  Comment: Poorly evaluated secondary to extensive image noise.  Minimal Diameter-8.7 x 6.3 mm  Tortuosity-mild  Calcification - moderate  Review of the MIP images confirms the above findings.  IMPRESSION: 1. Vascular findings and measurements pertinent to potential TAVR procedure, as detailed above. Please note that portions of today's examination were severely limited by beam hardening artifact from the patient's right hip arthroplasty, as indicated above. 2. Severe thickening calcification of the aortic valve, compatible with the reported clinical history of severe aortic stenosis. 3. Moderate calcification of the mitral annulus. 4. Cardiomegaly with severe left atrial dilatation. 5. Aortic atherosclerosis, in addition to left main and 3 vessel coronary artery disease. Please note that although the presence of coronary artery calcium documents the presence of coronary artery disease, the severity of this disease and any potential stenosis cannot be assessed on this non-gated CT examination. Assessment for potential risk factor modification, dietary therapy or pharmacologic therapy may be warranted, if  clinically indicated. 6. Severe dilatation of the pulmonic trunk (4.5 cm in diameter), strongly suggestive of pulmonary arterial hypertension. 7. Trace right pleural effusion lying dependently. 8. Multiple small pulmonary nodules in the right lung measuring 5 mm or less in size. These are nonspecific, but are statistically likely benign. No follow-up needed if patient is low-risk (and has no known or suspected primary neoplasm). Non-contrast chest CT can be considered in 12 months if patient is high-risk. This recommendation follows the consensus statement: Guidelines for Management of Incidental Pulmonary Nodules Detected on CT Images: From the Fleischner Society 2017; Radiology 2017; 284:228-243. 9. Small nonobstructive calculi in the collecting systems of both kidneys. 10. Additional incidental findings, as above.   Electronically Signed   By: Vinnie Langton M.D.   On: 07/11/2018 14:48  Pulmonary function test  Order: [id number]  Status:  Edited Result - FINAL  Visible to patient:  No (Not Released)  Next appt:  07/25/2018 at 11:00 AM in Cardiology ([facility] PA/NP)  Dx:  DOE (dyspnea on exertion); Severe aor...   Ref Range & Units 3d ago  FVC-Pre L 2.30   FVC-%Pred-Pre % 54   FVC-Post L 2.46   FVC-%Pred-Post % 58   FVC-%Change-Post % 6   FEV1-Pre L 1.45   FEV1-%Pred-Pre % 45   FEV1-Post L 1.72   FEV1-%Pred-Post % 53   FEV1-%Change-Post % 18   FEV6-Pre L 2.23   FEV6-%Pred-Pre % 55   FEV6-Post L 2.44   FEV6-%Pred-Post % 60   FEV6-%Change-Post % 9   Pre FEV1/FVC ratio % 63   FEV1FVC-%Pred-Pre % 81   Post FEV1/FVC ratio % 70   FEV1FVC-%Change-Post % 11   Pre FEV6/FVC Ratio % 97   FEV6FVC-%Pred-Pre % 101   Post FEV6/FVC ratio % 99   FEV6FVC-%Pred-Post % 103   FEV6FVC-%Change-Post % 2   FEF 25-75 Pre L/sec 0.67   FEF2575-%Pred-Pre % 23   FEF 25-75 Post L/sec 1.28   FEF2575-%Pred-Post % 45   FEF2575-%Change-Post % 91   RV L 3.35   RV % pred % 133   TLC L 5.79  TLC % pred % 78   DLCO unc ml/min/mmHg 17.47   DLCO unc % pred % 49   DL/VA ml/min/mmHg/L 4.40   DL/VA % pred % 93   Resulting Agency  [facility]      Specimen Collected: 07/11/18 11:01  Last Resulted: 07/11/18 11:43          Impression:  This 66 year old gentleman has stage D, severe, symptomatic aortic stenosis with New York Heart Association class III symptoms of exertional fatigue and shortness of breath consistent with chronic diastolic congestive heart failure.  I have personally reviewed his 2D echocardiogram, right and left heart catheterization, and CTA studies.  His echocardiogram shows a trileaflet aortic valve with severely calcified leaflets with restricted mobility.  The mean gradient is 57 mmHg consistent with severe aortic stenosis.  Left ventricular ejection fraction is 65 to 70% with grade 3 diastolic dysfunction.  His right heart catheterization shows severe pulmonary hypertension with PA pressures that were at least half systemic.  He has no significant coronary disease.  Pulmonary vascular resistance is 2.5 Wood units and cardiac index is 4.4.  I agree that aortic valve replacement is indicated in this gentleman for symptomatic relief and to prevent recurrent congestive heart failure and progressive left ventricular deterioration.  I think he would be at high risk for open surgical AVR due to his morbid obesity, severe obstructive airways disease with moderate restriction and moderate decrease in diffusion capacity, in addition to his other comorbid risk factors.  I think transcatheter aortic valve replacement would be the best alternative for him.  His gated cardiac CTA shows anatomy that is suitable for transcatheter aortic valve replacement.  His abdominal and pelvic CTA shows pelvic arterial anatomy that is adequate for transfemoral insertion.  The patient was counseled at length regarding treatment alternatives for management of severe symptomatic aortic stenosis. The risks  and benefits of surgical intervention has been discussed in detail. Long-term prognosis with medical therapy was discussed. Alternative approaches such as conventional surgical aortic valve replacement, transcatheter aortic valve replacement, and palliative medical therapy were compared and contrasted at length. This discussion was placed in the context of the patient's own specific clinical presentation and past medical history. All of his questions have been addressed.   Following the decision to proceed with transcatheter aortic valve replacement, a discussion was held regarding what types of management strategies would be attempted intraoperatively in the event of life-threatening complications, including whether or not the patient would be considered a candidate for the use of cardiopulmonary bypass and/or conversion to open sternotomy for attempted surgical intervention.    The patient has been advised of a variety of complications that might develop including but not limited to risks of death, stroke, paravalvular leak, aortic dissection or other major vascular complications, aortic annulus rupture, device embolization, cardiac rupture or perforation, mitral regurgitation, acute myocardial infarction, arrhythmia, heart block or bradycardia requiring permanent pacemaker placement, congestive heart failure, respiratory failure, renal failure, pneumonia, infection, other late complications related to structural valve deterioration or migration, or other complications that might ultimately cause a temporary or permanent loss of functional independence or other long term morbidity. The patient provides full informed consent for the procedure as described and all questions were answered.     Plan:  He will be discharged home once he is felt to be stable following his dental extractions and will be tentatively scheduled for transfemoral transcatheter aortic valve replacement on 07/25/2018.  I spent 60  minutes performing this consultation and >  50% of this time was spent face to face counseling and coordinating the care of this patient's severe symptomatic aortic stenosis.    Gaye Pollack, MD 07/13/2018

## 2018-07-14 NOTE — Progress Notes (Addendum)
Advanced Heart Failure Rounding Note  PCP-Cardiologist: No primary care provider on file.   Subjective:    S/p multiple teeth extractions 07/13/18. Weight stable. (Down 32 lbs from admit)  Pain well controlled this am, but got IV pain meds about 30 minutes prior to my assessment. Transitioning from IV to po today. Denies SOB, lightheadedness, or dizziness. No orthopnea or PND.   PFTs 07/10/18 - Completed, results pending.  R/LHC 07/10/18 Ao = 118/83 (104) LV = 207/30 RA = 17 RV = 87/13 PA = 91/34 (54) PCW = 28 Fick cardiac output/index = 10.3/4.4 PVR = 2.5 WU FA sat = 99% PA sat = 78%, 79%  Aortic valve: Mean gradient 67 mmHG  Peak gradient  AVA 1.06 cm2  Assessment:  1. Normal coronaries 2. Normal LV function EF 65% 3. Severe aortic stenosis 4. Severe pulmonary venous HTN with high-output state  Objective:   Weight Range: 112 kg Body mass index is 33.49 kg/m.   Vital Signs:   Temp:  [97.7 F (36.5 C)-99.1 F (37.3 C)] 99.1 F (37.3 C) (08/23 0327) Pulse Rate:  [93-111] 101 (08/23 0327) Resp:  [7-20] 12 (08/23 0327) BP: (109-147)/(74-89) 141/86 (08/23 0327) SpO2:  [90 %-100 %] 92 % (08/23 0327) Arterial Line BP: (105-134)/(51-80) 134/75 (08/22 1028) Weight:  [161 kg] 112 kg (08/23 0558) Last BM Date: 07/12/18  Weight change: Filed Weights   07/11/18 0300 07/13/18 0524 07/14/18 0558  Weight: 114.2 kg 111.6 kg 112 kg    Intake/Output:   Intake/Output Summary (Last 24 hours) at 07/14/2018 0808 Last data filed at 07/14/2018 0400 Gross per 24 hour  Intake 731.81 ml  Output 835 ml  Net -103.19 ml    Physical Exam   General: NAD  HEENT: Swollen, but no bleeding. Gauze and ice packs removed.  Neck: Supple. JVP 6-7 cm. Carotids 2+ bilat; no bruits. No thyromegaly or nodule noted. Cor: PMI nondisplaced. IRR, 2/6 AS.  Lungs: CTAB, normal effort. Abdomen: Obese Soft, non-tender, non-distended, no HSM. No bruits or masses. +BS  Extremities:  no cyanosis, clubbing, rash, edema Neuro: alert & oriented x 3, cranial nerves grossly intact. moves all 4 extremities w/o difficulty. Affect pleasant   Telemetry   Afib 90-100s, personally reviewed.   EKG    No new tracings.    Labs    CBC No results for input(s): WBC, NEUTROABS, HGB, HCT, MCV, PLT in the last 72 hours. Basic Metabolic Panel Recent Labs    09/60/45 1530  NA 135  K 4.4  CL 97*  CO2 28  GLUCOSE 101*  BUN 25*  CREATININE 1.29*  CALCIUM 9.1   Liver Function Tests No results for input(s): AST, ALT, ALKPHOS, BILITOT, PROT, ALBUMIN in the last 72 hours. No results for input(s): LIPASE, AMYLASE in the last 72 hours. Cardiac Enzymes No results for input(s): CKTOTAL, CKMB, CKMBINDEX, TROPONINI in the last 72 hours.  BNP: BNP (last 3 results) Recent Labs    06/10/18 2322 07/06/18 1810  BNP 685.0* 654.2*    ProBNP (last 3 results) No results for input(s): PROBNP in the last 8760 hours.   D-Dimer No results for input(s): DDIMER in the last 72 hours. Hemoglobin A1C No results for input(s): HGBA1C in the last 72 hours. Fasting Lipid Panel No results for input(s): CHOL, HDL, LDLCALC, TRIG, CHOLHDL, LDLDIRECT in the last 72 hours. Thyroid Function Tests No results for input(s): TSH, T4TOTAL, T3FREE, THYROIDAB in the last 72 hours.  Invalid input(s): FREET3  Other results:  Imaging   No results found.  Medications:     Scheduled Medications: . atorvastatin  80 mg Oral q1800  . diltiazem  120 mg Oral Daily  . enoxaparin (LOVENOX) injection  60 mg Subcutaneous Q24H  . losartan  25 mg Oral Daily  . sodium chloride flush  3 mL Intravenous Q12H  . sodium chloride flush  3 mL Intravenous Q12H  . sodium chloride flush  3 mL Intravenous Q12H  . spironolactone  25 mg Oral Daily    Infusions: . sodium chloride    . sodium chloride    . sodium chloride    . lactated ringers 10 mL/hr at 07/14/18 0400    PRN Medications: sodium chloride,  sodium chloride, sodium chloride, acetaminophen, morphine injection, ondansetron (ZOFRAN) IV, oxyCODONE-acetaminophen, sodium chloride flush, sodium chloride flush, sodium chloride flush    Patient Profile   Joel LeachRalph L Rogers is a 67 y.o. male with h/o morbid obesity, pulmonary HTN, chronic a fibrillation, sleep apnea, HTN, HLD, chronic venous insufficiency, chronic diastolic CHF, and severe Aortic stenosis.   [facility]. Aborted due to resp distress requiring BiPAP in setting of A/C diastolic CHF and pulmonary hypertension.  Assessment/Plan   1. Acute on chronic diastolic (congestive) heart failure  - Decompensated in cath lab 07/06/18 requiring BiPAP and Nitro gtt for hypertensive emergency.  - Has diuresed 32 pounds. Much improved. - BMET stable 07/13/18. Pending today.   - Continue spiro 25 mg daily.  - Continue losartan 25 mg daily for BP.  - Normal coronaries but severe pulmonary venous HTN with high output state by cath 07/10/18. 2. Acute hypoxic resp distress - Resolved with Bipap and diuresis. On 2 L [place].   3. Hypertensive Emergency - BP has been stable 90-100s.   4. Severe aortic stenosis - Echo March 2019 EF 60% - severe AS, AVA 0.88, peak 76 mmHg, pulmonary HTN- PA 92 mmHg - Echo 07/07/18 EF 65-70% Severe AS Mean gradient (S): 57 mm Hg. Peak(S): 100 mm Hg. RVSP 66mmHG - [facility]  - Will need TAVR. Structural team consulted. No change.  5. Morbid obesity  - Body mass index is 33.49 kg/m.  - h/o gastric bypass with significant (lost 140 lbs) 6. Obstructive sleep apnea - OSA improved on sleep study 05/16/18, likely with weight loss.  - Has stated he no longer needs CPAP based on recent sleep study. No change.   7. Chronic atrial fibrillation  - Taken off anticoagulation secondary to SDH after a fall Dec 2017- - Rate improved with diuresis. Elevated currently with pain.  - Reconsider AC at some point.  8. Poor  dentition - Seen by Dr. Kristin BruinsKulinski. Went for multiple teeth extractions 07/13/18. - Pain well controlled this am. Will need adequate control for home.  9. Hypokalemia - K 4.4 07/13/18. Follow.    Continue to watch for pain control. Will need to resume diuretics once taking po again.   Length of Stay: [address]8  Graciella FreerMichael Andrew Tillery, [place]PA-C  07/14/2018, 8:08 AM  Advanced Heart Failure Team Pager [phone] (M-F; 7a - 4p)  Please contact [facility] for night-coverage after hours (4p -7a ) and weekends on [email or web address]  Patient seen and examined with the above-signed Advanced Practice Provider and/or Housestaff. I personally reviewed laboratory data, imaging studies and relevant notes. I independently examined the patient and formulated the important aspects of the plan. I have edited the note to reflect any of my changes or salient points.  I have personally discussed the plan with the patient and/or family.  Mouth sore but otherwise feeling much better. Volume status looks great. Weight stable. Renal function stable. Remains in chronic AF.  Likely can go home in next day or two once pain controlled. Will need to schedule TAVR.  Arvilla Meres, MD  6:53 PM

## 2018-07-14 NOTE — Progress Notes (Signed)
POST OPERATIVE NOTE:  07/14/2018 Claudie Leachalph L Geyer [id number]  VITALS: BP (!) 141/86 (BP Location: Left Arm)   Pulse (!) 101   Temp 99.1 F (37.3 C) (Axillary)   Resp 12   Ht 6' (1.829 m)   Wt 112 kg   SpO2 92%   BMI 33.49 kg/m   LABS:  Lab Results  Component Value Date   WBC 7.8 07/09/2018   HGB 10.7 (L) 07/09/2018   HCT 32.6 (L) 07/09/2018   MCV 73.8 (L) 07/09/2018   PLT 192 07/09/2018   BMET    Component Value Date/Time   NA 135 07/13/2018 1530   NA 142 06/27/2018 1050   K 4.4 07/13/2018 1530   CL 97 (L) 07/13/2018 1530   CO2 28 07/13/2018 1530   GLUCOSE 101 (H) 07/13/2018 1530   BUN 25 (H) 07/13/2018 1530   BUN 14 06/27/2018 1050   CREATININE 1.29 (H) 07/13/2018 1530   CREATININE 1.04 05/01/2018 0913   CALCIUM 9.1 07/13/2018 1530   GFRNONAA 56 (L) 07/13/2018 1530   GFRNONAA 74 05/01/2018 0913   GFRAA >60 07/13/2018 1530   GFRAA 86 05/01/2018 0913    Lab Results  Component Value Date   INR 1.25 07/09/2018   INR 1.07 08/27/2016   INR 1.13 08/26/2016   No results found for: PTT   Claudie LeachRalph L Mckenna is status post multiple extractions with alveoloplasty and gross debridement of teeth in the OR with monitored anesthesia care on 07/13/18.  SUBJECTIVE: Patient has some post operative discomfort.  Pain is much better than it was after the surgery by report.    EXAM: There is no sign of oral infection.  There is some heme noted within the mouth.  Sutures are intact. Clots present.  ASSESSMENT: Post operative course is consistent with dental procedures performed in the operating room. Loss of teeth due to extractions Some heme in the mouth consistent with restart of Lovenox therapy last evening. Chronic periodontitis Multiple flexure lesions Malocclusion  PLAN: 1.  We will order Amicar rinses.  Patient is to rinse with 10 mL's every hour for the next 10 hours and a swish and spit manner.   2.  Advance diet as tolerated.  Ordered nutritional  consultation. 3.  Brush teeth after meals and at bedtime 4.  Return to Dental Medicine for evaluation healing and for suture removal as needed in early September.  Sutures may dissolve on their own.     Charlynne Panderonald F. Kinlee Garrison, DDS

## 2018-07-14 NOTE — Progress Notes (Signed)
Physical Therapy Treatment Patient Details Name: Joel Rogers MRN: [id number] DOB: 11-Nov-1951 Today's Date: 07/14/2018    History of Present Illness Joel Rogers is a 67 y.o. male with h/o morbid obesity, pulmonary HTN, chronic a fibrillation, sleep apnea, HTN, HLD, chronic venous insufficiency, chronic diastolic CHF, and severe Aortic stenosis.  Pt seen 8/6 for consideration of TAVR. Pt admitted for severe DOE and increasing peripheral edema.    PT Comments    Pt reports feeling drowsy and more imbalanced this visit, saying he's felt like this since his tooth extraction. Pt requests to use RW this session for ambulation. Walked 200' on RA, SpO2 88-93% on RA. HR 95-105 this session. No overt LOB but slower gait and need for RW, prior sessions no AD used. PT will cont to follow.     Follow Up Recommendations  No PT follow up;Supervision - Intermittent     Equipment Recommendations  None recommended by PT    Recommendations for Other Services       Precautions / Restrictions Precautions Precautions: None Restrictions Weight Bearing Restrictions: No    Mobility  Bed Mobility               General bed mobility comments: Sitting EOB eating breakfast upon arrival  Transfers Overall transfer level: Needs assistance Equipment used: Rolling walker (2 wheeled) Transfers: Sit to/from Stand Sit to Stand: Supervision            Ambulation/Gait Ambulation/Gait assistance: Supervision Gait Distance (Feet): 200 Feet Assistive device: None Gait Pattern/deviations: Step-to pattern Gait velocity: slow   General Gait Details: Pt with short step legnth slow gait. request RW today due to drowsiness and feeling weak s/p anasthesia from tooth extraction.    Stairs             Wheelchair Mobility    Modified Rankin (Stroke Patients Only)       Balance Overall balance assessment: Mild deficits observed, not formally tested                                           Cognition Arousal/Alertness: Awake/alert Behavior During Therapy: WFL for tasks assessed/performed Overall Cognitive Status: Within Functional Limits for tasks assessed                                        Exercises      General Comments        Pertinent Vitals/Pain Pain Assessment: No/denies pain    Home Living                      Prior Function            PT Goals (current goals can now be found in the care plan section) Acute Rehab PT Goals Patient Stated Goal: home PT Goal Formulation: With patient Time For Goal Achievement: 07/26/18 Potential to Achieve Goals: Good Progress towards PT goals: Progressing toward goals    Frequency    Min 2X/week      PT Plan Current plan remains appropriate    Co-evaluation              AM-PAC PT "6 Clicks" Daily Activity  Outcome Measure  Difficulty turning over in bed (including adjusting bedclothes, sheets and blankets)?: None Difficulty  moving from lying on back to sitting on the side of the bed? : None Difficulty sitting down on and standing up from a chair with arms (e.g., wheelchair, bedside commode, etc,.)?: None Help needed moving to and from a bed to chair (including a wheelchair)?: None Help needed walking in hospital room?: A Little Help needed climbing 3-5 steps with a railing? : A Little 6 Click Score: 22    End of Session Equipment Utilized During Treatment: Gait belt Activity Tolerance: Patient tolerated treatment well Patient left: in chair;with call bell/phone within reach Nurse Communication: Mobility status PT Visit Diagnosis: Unsteadiness on feet (R26.81)     Time: [id number] PT Time Calculation (min) (ACUTE ONLY): 25 min  Charges:  $Gait Training: 8-22 mins                     Etta GrandchildSean Taniah Reinecke, PT, DPT Acute Rehab Services Pager: [phone]     Etta GrandchildSean Rayana Geurin 07/14/2018, 9:32 AM

## 2018-07-14 NOTE — Care Management Note (Addendum)
Case Management Note  Patient Details  Name: Joel Rogers MRN: [id number] Date of Birth: August 17, 1951  Subjective/Objective:   Pt admitted with HF                 Action/Plan:   PTA independent from home with wife.  Pt has PCP and denied barriers with paying for medications.  CM educated pt on importance of daily weights and low salt diet.  CM will continue to follow for discharge needs   Expected Discharge Date:                  Expected Discharge Plan:  Home/Self Care(Independent from home, has PCP and denied barriers with obtaining medications.  )  In-House Referral:     Discharge planning Services  CM Consult  Post Acute Care Choice:    Choice offered to:     DME Arranged:    DME Agency:     HH Arranged:    HH Agency:     Status of Service:     If discussed at [employer] Meetings, dates discussed:    Additional Comments: 07/14/2018 Pt is s/p multiple teeth extractions, undergoing workup for TAVR.  Barrier to discharge is uncontrolled pain requiring IV pain meds Cherylann ParrClaxton, Shalae Belmonte S, RN 07/14/2018, 2:26 PM

## 2018-07-14 NOTE — Progress Notes (Deleted)
Initial Nutrition Assessment   DOCUMENTATION CODES:   Obesity unspecified  INTERVENTION:    Dysphagia 2, thin liquid diet (moist, soft textures; meats are minced)  Boost Breeze po TID, each supplement provides 250 kcal and 9 grams of protein  NUTRITION DIAGNOSIS:   Increased nutrient needs related to chronic illness as evidenced by estimated needs  GOAL:   Patient will meet greater than or equal to 90% of their needs  MONITOR:   PO intake, Supplement acceptance, Labs, I & O's, Weight trends, Skin  REASON FOR ASSESSMENT:   Consult Assessment of nutrition requirement/status  ASSESSMENT:   67 yo Male with pulmonary HTN, chronic a fibrillation, sleep apnea, chronic venous insufficiency, CHF, and severe aortic stenosis. Pt admitted for severe DOE and increasing peripheral edema.  Pt s/p procedure 8/22: MULTIPLE TOOTH EXTRACTIONS ALVEOPLASTY  RD spoke with pt today. Sitting in his recliner.  Just finished his breakfast. PO intake 75% per flowsheet records. Endorses intentional weight loss of approximately 30 lbs due to fluid loss.  States his mouth is still sore from his surgery yesterday. Labs reviewed. K 6.5 (H). BUN 33 (H). Cr 1.50 (H). Medications reviewed. CBG 102.  NUTRITION - FOCUSED PHYSICAL EXAM:  Completed. No muscle or fat depletions noticed.  Diet Order:   Diet Order            Diet clear liquid Room service appropriate? No; Fluid consistency: Thin  Diet effective now             EDUCATION NEEDS:   No education needs have been identified at this time  Skin:  Skin Assessment: Reviewed RN Assessment  Last BM:  8/21  Height:   Ht Readings from Last 1 Encounters:  07/06/18 6' (1.829 m)   Weight:   Wt Readings from Last 1 Encounters:  07/14/18 112 kg   BMI:  Body mass index is 33.49 kg/m.  Estimated Nutritional Needs:   Kcal:  2000-2200  Protein:  100-115 gm  Fluid:  per MD  Maureen ChattersKatie Cherene Dobbins, RD, LDN Pager #:  [phone] After-Hours Pager #: [phone]

## 2018-07-14 NOTE — Progress Notes (Signed)
Initial Nutrition Assessment   DOCUMENTATION CODES:   Obesity unspecified  INTERVENTION:    Dysphagia 2, thin liquid diet (cohesive, moist, semisolid foods)  Boost Breeze po TID, each supplement provides 250 kcal and 9 grams of protein  NUTRITION DIAGNOSIS:   Increased nutrient needs related to chronic illness as evidenced by estimated needs  GOAL:   Patient will meet greater than or equal to 90% of their needs  MONITOR:   PO intake, Supplement acceptance, Labs, I & O's, Weight trends, Skin  REASON FOR ASSESSMENT:   Consult Assessment of nutrition requirement/status  ASSESSMENT:   67 yo Male with pulmonary HTN, chronic a fibrillation, sleep apnea, chronic venous insufficiency, CHF, and severe aortic stenosis. Pt admitted for severe DOE and increasing peripheral edema.  Pt s/p procedure 8/22: MULTIPLE TOOTH EXTRACTIONS ALVEOPLASTY  RD spoke with pt today. Sitting in his recliner.  Just finished his breakfast. PO intake 75% per flowsheet records. Endorses intentional weight loss of approximately 30 lbs due to fluid loss.  States his mouth is still sore from his surgery yesterday. Labs reviewed. K 6.5 (H). BUN 33 (H). Cr 1.50 (H). Medications reviewed. CBG 102.  NUTRITION - FOCUSED PHYSICAL EXAM:  Completed. No muscle or fat depletions noticed.  Diet Order:   Diet Order            DIET DYS 2 Room service appropriate? Yes; Fluid consistency: Thin  Diet effective now             EDUCATION NEEDS:   No education needs have been identified at this time  Skin:  Skin Assessment: Reviewed RN Assessment  Last BM:  8/21  Height:   Ht Readings from Last 1 Encounters:  07/06/18 6' (1.829 m)   Weight:   Wt Readings from Last 1 Encounters:  07/14/18 112 kg   BMI:  Body mass index is 33.49 kg/m.  Estimated Nutritional Needs:   Kcal:  2000-2200  Protein:  100-115 gm  Fluid:  per MD  Maureen ChattersKatie Kharis Lapenna, RD, LDN Pager #: [phone] After-Hours Pager #:  [phone]

## 2018-07-15 LAB — BASIC METABOLIC PANEL
ANION GAP: 11 (ref 5–15)
BUN: 42 mg/dL — ABNORMAL HIGH (ref 8–23)
CO2: 28 mmol/L (ref 22–32)
Calcium: 9.6 mg/dL (ref 8.9–10.3)
Chloride: 96 mmol/L — ABNORMAL LOW (ref 98–111)
Creatinine, Ser: 1.92 mg/dL — ABNORMAL HIGH (ref 0.61–1.24)
GFR, EST AFRICAN AMERICAN: 40 mL/min — AB (ref >60)
GFR, EST NON AFRICAN AMERICAN: 34 mL/min — AB (ref >60)
Glucose, Bld: 113 mg/dL — ABNORMAL HIGH (ref 70–99)
POTASSIUM: 4.7 mmol/L (ref 3.5–5.1)
SODIUM: 135 mmol/L (ref 135–145)

## 2018-07-15 NOTE — Progress Notes (Signed)
CARDIAC REHAB PHASE I   Patient just started eating lunch. Does not want to walk at this time. Encouraged patient to walk later in PM with RN.   Barnabas ListerMolly M Laurel Smeltz, MS 07/15/2018 1:21 PM

## 2018-07-15 NOTE — Progress Notes (Signed)
1/2 Percocet wasted with Cherly HensenBernadette, Charity fundraiserN as witness.   [place]  Exodus Kutzer, RN

## 2018-07-15 NOTE — Progress Notes (Signed)
Patients right radial Art line removed due to occlusion. Pressure held for 8 minutes and dressing applied. Patient understands instructions to hold wrist steady and allow artery to clot off on the inside. No bleeding noted presently. Will watch closely.

## 2018-07-15 NOTE — Progress Notes (Signed)
Advanced Heart Failure Rounding Note  PCP-Cardiologist: No primary care provider on file.   Subjective:    S/p multiple teeth extractions 07/13/18. Weight down another 2 pounds (Down 34 lbs from admit)  Feels good today. Oral pain much better. No bleeding. Denies SOB, orthopnea or PND. No edema.     Objective:   Weight Range: 111.1 kg Body mass index is 33.22 kg/m.   Vital Signs:   Temp:  [97.1 F (36.2 C)-98.9 F (37.2 C)] 97.1 F (36.2 C) (08/24 1236) Pulse Rate:  [72-96] 81 (08/24 1236) Resp:  [7-24] 12 (08/24 1236) BP: (91-131)/(72-84) 91/72 (08/24 1236) SpO2:  [89 %-98 %] 98 % (08/24 1236) Weight:  [111.1 kg] 111.1 kg (08/24 0600) Last BM Date: 07/14/18  Weight change: Filed Weights   07/13/18 0524 07/14/18 0558 07/15/18 0600  Weight: 111.6 kg 112 kg 111.1 kg    Intake/Output:   Intake/Output Summary (Last 24 hours) at 07/15/2018 1532 Last data filed at 07/15/2018 1400 Gross per 24 hour  Intake 481.39 ml  Output -  Net 481.39 ml    Physical Exam   General:  Well appearing. No resp difficulty HEENT: normal s/p extractions Neck: supple. no JVD. Carotids 2+ bilat; + bruits. No lymphadenopathy or thryomegaly appreciated. Cor: PMI nondisplaced. Irregular rate & rhythm. 2/6 AS Lungs: clear Abdomen: soft, nontender, nondistended. No hepatosplenomegaly. No bruits or masses. Good bowel sounds. Extremities: no cyanosis, clubbing, rash, edema Neuro: alert & orientedx3, cranial nerves grossly intact. moves all 4 extremities w/o difficulty. Affect pleasant   Telemetry   Afib 80-90s, personally reviewed.   EKG    No new tracings.    Labs    CBC No results for input(s): WBC, NEUTROABS, HGB, HCT, MCV, PLT in the last 72 hours. Basic Metabolic Panel Recent Labs    29/56/21 0810 07/14/18 1016 07/15/18 0630  NA 135  --  135  K 6.5* 4.3 4.7  CL 97*  --  96*  CO2 27  --  28  GLUCOSE 92  --  113*  BUN 33*  --  42*  CREATININE 1.50*  --  1.92*    CALCIUM 9.0  --  9.6   Liver Function Tests No results for input(s): AST, ALT, ALKPHOS, BILITOT, PROT, ALBUMIN in the last 72 hours. No results for input(s): LIPASE, AMYLASE in the last 72 hours. Cardiac Enzymes No results for input(s): CKTOTAL, CKMB, CKMBINDEX, TROPONINI in the last 72 hours.  BNP: BNP (last 3 results) Recent Labs    06/10/18 2322 07/06/18 1810  BNP 685.0* 654.2*    ProBNP (last 3 results) No results for input(s): PROBNP in the last 8760 hours.   D-Dimer No results for input(s): DDIMER in the last 72 hours. Hemoglobin A1C No results for input(s): HGBA1C in the last 72 hours. Fasting Lipid Panel No results for input(s): CHOL, HDL, LDLCALC, TRIG, CHOLHDL, LDLDIRECT in the last 72 hours. Thyroid Function Tests No results for input(s): TSH, T4TOTAL, T3FREE, THYROIDAB in the last 72 hours.  Invalid input(s): FREET3  Other results:   Imaging   No results found.  Medications:     Scheduled Medications: . atorvastatin  80 mg Oral q1800  . diltiazem  120 mg Oral Daily  . enoxaparin (LOVENOX) injection  60 mg Subcutaneous Q24H  . feeding supplement  1 Container Oral BID BM  . losartan  25 mg Oral Daily  . sodium chloride flush  3 mL Intravenous Q12H  . sodium chloride flush  3 mL  Intravenous Q12H  . sodium chloride flush  3 mL Intravenous Q12H  . spironolactone  25 mg Oral Daily    Infusions: . sodium chloride    . sodium chloride    . sodium chloride    . lactated ringers 10 mL/hr at 07/15/18 0921    PRN Medications: sodium chloride, sodium chloride, sodium chloride, acetaminophen, morphine injection, ondansetron (ZOFRAN) IV, oxyCODONE-acetaminophen, sodium chloride flush, sodium chloride flush, sodium chloride flush    Patient Profile   Joel Rogers is a 67 y.o. male with h/o morbid obesity, pulmonary HTN, chronic a fibrillation, sleep apnea, HTN, HLD, chronic venous insufficiency, chronic diastolic CHF, and severe Aortic stenosis.    [facility] L/RHC. Aborted due to resp distress requiring BiPAP in setting of A/C diastolic CHF and pulmonary hypertension.  Assessment/Plan   1. Acute on chronic diastolic (congestive) heart failure  - Decompensated in cath lab 07/06/18 requiring BiPAP and Nitro gtt for hypertensive emergency.  - Has diuresed 34 pounds. Much improved. - Off diuretics. Creatinine climbing. Encouraged po intake - Stop losartan and spiro with low BP and rising creatinine - Normal coronaries but severe pulmonary venous HTN with high output state by cath 07/10/18. 2. Acute hypoxic resp distress - Resolved with Bipap and diuresis. On 2 L [place].   3. Hypertensive Emergency - BP low after diuresis.  Stop losartan and spiro with low BP and rising creatinine 4. Severe aortic stenosis - Echo March 2019 EF 60% - severe AS, AVA 0.88, peak 76 mmHg, pulmonary HTN- PA 92 mmHg - Echo 07/07/18 EF 65-70% Severe AS Mean gradient (S): 57 mm Hg. Peak(S): 100 mm Hg. RVSP 66mmHG - [facility] 07[facility]10[facility].  - Structural team consulted. TAVR planned for 9/3 - CTs and PFTs reviewed 5. Morbid obesity  - Body mass index is 33.22 kg/m.  - h/o gastric bypass with significant (lost 140 lbs) 6. Obstructive sleep apnea - OSA improved on sleep study 05/16/18, likely with weight loss.  - Has stated he no longer needs CPAP based on recent sleep study. No change.   7. Chronic atrial fibrillation  - Taken off anticoagulation secondary to SDH after a fall Dec 2017- - Rate improved with diuresi - Reconsider AC at some point.  8. Poor dentition - Seen by Dr. Kristin BruinsKulinski. Went for multiple teeth extractions 07/13/18. - Pain well controlled this am.  9. Hypokalemia - Resolved  Doing well. Home in am if creatinine improved and pain stable.    Length of Stay: 9  Arvilla Meresaniel Bensimhon, MD  07/15/2018, 3:32 PM  Advanced Heart Failure Team Pager [phone] (M-F; 7a - 4p)  Please contact [facility] for  night-coverage after hours (4p -7a ) and weekends on [email or web address]

## 2018-07-16 LAB — BASIC METABOLIC PANEL
Anion gap: 10 (ref 5–15)
BUN: 43 mg/dL — AB (ref 8–23)
CALCIUM: 9.2 mg/dL (ref 8.9–10.3)
CO2: 28 mmol/L (ref 22–32)
CREATININE: 1.52 mg/dL — AB (ref 0.61–1.24)
Chloride: 98 mmol/L (ref 98–111)
GFR calc Af Amer: 53 mL/min — ABNORMAL LOW (ref >60)
GFR calc non Af Amer: 46 mL/min — ABNORMAL LOW (ref >60)
Glucose, Bld: 111 mg/dL — ABNORMAL HIGH (ref 70–99)
Potassium: 4.3 mmol/L (ref 3.5–5.1)
SODIUM: 136 mmol/L (ref 135–145)

## 2018-07-16 MED ORDER — TORSEMIDE 20 MG PO TABS: 40.0000 mg | ORAL_TABLET | Freq: Every day | ORAL | 11 refills | Status: DC

## 2018-07-16 MED ORDER — SPIRONOLACTONE 25 MG PO TABS: 12.5000 mg | ORAL_TABLET | Freq: Every day | ORAL | 5 refills | Status: DC

## 2018-07-16 NOTE — Discharge Summary (Addendum)
Discharge Summary    Patient ID: Joel Rogers,  MRN: [id number], DOB/AGE: Jun 14, 1951 67 y.o.  Admit date: 07/06/2018 Discharge date: 07/16/2018  Primary Care Provider: Allayne Butcher B Primary Cardiologist: Nanetta Batty, MD   Discharge Diagnoses    Active Problems:   Acute on chronic diastolic (congestive) heart failure (HCC)   Acute diastolic CHF (congestive heart failure), NYHA class 4 (HCC)   Allergies No Known Allergies  Diagnostic Studies/Procedures    CARDIAC CATH: 07/06/2018 1. Severe elevation of pressures in the RA and RV. Unable to obtain PA pressures. Case aborted due to respiratory distress during the procedure.  2. Acute on chronic diastolic CHF 3. Severe pulmonary HTN by echo 4. Severe aortic stenosis by echo.   Recommendations: He will be admitted to our Advanced Heart Failure team to begin diuresis and optimization of fluid status prior to further attempts at right and left heart catheterization. Will continue IV NTG and IV Lasix.   CARDIAC CATH: 07/10/2018 Findings:  Ao = 118/83 (104) LV = 207/30 RA = 17 RV = 87/13 PA = 91/34 (54) PCW = 28 Fick cardiac output/index = 10.3/4.4 PVR = 2.5 WU FA sat = 99% PA sat = 78%, 79%  Aortic valve: Mean gradient 67 mmHG  Peak gradient  AVA 1.06 cm2  Assessment:  1. Normal coronaries 2. Normal LV function EF 65% 3. Severe aortic stenosis 4. Severe pulmonary venous HTN with high-output state Plan/Discussion: Continue diuresis. Continue w/u for TAVR.   ECHO: 07/07/2018 - Left ventricle: The cavity size was normal. Wall thickness was   increased in a pattern of moderate LVH. Systolic function was   vigorous. The estimated ejection fraction was in the range of 65%   to 70%. Wall motion was normal; there were no regional wall   motion abnormalities. The study was not technically sufficient to   allow evaluation of LV diastolic dysfunction due to atrial   fibrillation. - Aortic valve:  Trileaflet; severely calcified leaflets. There was   severe stenosis. There was trivial regurgitation. Mean gradient   (S): 57 mm Hg. Peak gradient (S): 100 mm Hg. Valve area (VTI):   0.97 cm^2. - Mitral valve: Mildly calcified annulus. There was trivial   regurgitation. - Left atrium: The atrium was severely dilated. - Right ventricle: The cavity size was mildly dilated. Systolic   function was normal. - Right atrium: The atrium was severely dilated. - Tricuspid valve: Peak RV-RA gradient (S): 51 mm Hg. - Pulmonary arteries: PA peak pressure: 66 mm Hg (S). - Systemic veins: IVC measured 3.4 cm with < 50% respirophasic   variation, suggesting RA pressure 15 mmHg.  Impressions:  - The patient was in atrial fibrillation. Normal LV size with   moderate LV hypertrophy. EF 65-70%. Mildly dilated RV with normal   systolic function. Severe aortic stenosis. Severe biatrial   enlargement. Moderate pulmonary hypertension. Dilated IVC   suggestive of elevated RV filling pressure.  _____________   History of Present Illness     Joel Rogers is a 67 y.o. male with h/o morbid obesity, pulmonary HTN, chronic a fibrillation, sleep apnea, HTN, HLD, chronic venous insufficiency, chronic diastolic CHF, and severe Aortic stenosis.   Presented to [facility] 07/06/18 for L/RHC. Aborted due to resp distress requiring BiPAP in setting of A/C diastolic CHF and pulmonary hypertension.  Hospital Course     Consultants: TCTS  1. Acute on chronic diastolic (congestive) heart failure - Decompensated in cath lab 07/06/18 requiring BiPAP  and Nitro gtt for hypertensive emergency. - Has diuresed 37 pounds on Lasix 80 mg IV twice daily. Much improved. - Off diuretics and anti-HTN agents due to AKI and low BP, both have improved -He is to restart torsemide at 40 mg qd and Spironolactone as well, he will not be started on losartan or metolazone. - Normal coronaries but severe pulmonary venous HTN with high output  state by cath 07/10/18. -Discharge weight 109.5 kg, 241 pounds -he [facility] at 11 AM.  However, he may also get TAVR that day and may need to reschedule, pt and family aware.  2. Acutehypoxicresp distress - Resolved with Bipap and diuresis. - On 2 L [place] at first but this was weaned and discontinued. -O2 sats are currently 99% on room air   3. Hypertensive Emergency - BP low after diuresis.  Stopped  furosemide, metolazone, and spiro 8/18 with low BP and rising creatinine.  -Losartan stopped 8/24 -Renal function is improved today, continue to follow  - because of his renal function, d/c colchicine and ibuprofen 800 mg, both prn meds for gout.   4.Severe aortic stenosis -Echo March 2019 EF 60% - severe AS, AVA 0.88, peak 76 mmHg, pulmonary HTN- PA 92 mmHg - Echo 07/07/18 EF 65-70% Severe AS Mean gradient (S): 57 mm Hg. Peak(S): 100 mm Hg. RVSP - Severe aortic stenosis by [facility] 07/10/18.  - CTs and PFTs reviewed by Dr. Gala Romney - Structural team consulted. TAVR tentatively planned for 9/3, not scheduled yet  5. Morbid obesity  - Body mass index is 32.74 kg/m.  - h/o gastric bypass with significant weight loss (lost 140 lbs)   6. Obstructive sleep apnea - OSA improved on sleep study 05/16/18, likely with weight loss.  - Has stated he no longer needs CPAP based on recent sleep study. No change.    7. Chronic atrial fibrillation  - Taken off anticoagulation secondary to SDH after a fall Dec 2017- - Rate improved with diuresis - Reconsider AC at some point.   8. Poor dentition - Seen by Dr. Kristin Bruins. Went for multiple teeth extractions 07/13/18. - Pain well controlled this am, pt says will not need narcotics.  9. Hypokalemia - Resolved with supplementation - with lower torsemide dose and spiro, no supplement for now.  On 8/25, Dr. Gala Romney saw Mr. Corine Shelter and all data were reviewed. Discharge medications were listed in his note  and are on the AVS. Continue daily weights and low-sodium diet, follow-up as scheduled.  _____________  Discharge Vitals Blood pressure 117/80, pulse 80, temperature 98.6 F (37 C), temperature source Oral, resp. rate 13, height 6' (1.829 m), weight 109.5 kg, SpO2 99 %.  Filed Weights   07/14/18 0558 07/15/18 0600 07/16/18 0500  Weight: 112 kg 111.1 kg 109.5 kg    Labs & Radiologic Studies    CBC Lab Results  Component Value Date   WBC 7.8 07/09/2018   HGB 10.7 (L) 07/09/2018   HCT 32.6 (L) 07/09/2018   MCV 73.8 (L) 07/09/2018   PLT 192 07/09/2018    Basic Metabolic Panel Recent Labs    29/52/84 0630 07/16/18 0333  NA 135 136  K 4.7 4.3  CL 96* 98  CO2 28 28  GLUCOSE 113* 111*  BUN 42* 43*  CREATININE 1.92* 1.52*  CALCIUM 9.6 9.2   Liver Function Tests Hepatic Function Panel     Component Value Date/Time   PROT 7.2 07/06/2018 1810  ALBUMIN 3.3 (L) 07/06/2018 1810   AST 27 07/06/2018 1810   ALT 17 07/06/2018 1810   ALKPHOS 137 (H) 07/06/2018 1810   BILITOT 3.2 (H) 07/06/2018 1810    BNP    Component Value Date/Time   BNP 654.2 (H) 07/06/2018 1810    _____________  Dg Orthopantogram  Result Date: 07/10/2018 CLINICAL DATA:  Right lower molar pain. EXAM: ORTHOPANTOGRAM/PANORAMIC COMPARISON:  None. FINDINGS: No fracture or dislocation is noted. Poor dentition is noted. Large defect is seen involving right-sided posterior molar in right mandible concerning for dental infection. IMPRESSION: Large defect seen involving right-sided posterior molar in right mandible concerning for dental infection. Electronically Signed   By: Lupita Raider, M.D.   On: 07/10/2018 12:33   Ct Coronary Morph W/cta Cor W/score W/ca W/cm &/or Wo/cm  Addendum Date: 07/11/2018   ADDENDUM REPORT: 07/11/2018 13:42 EXAM: OVER-READ INTERPRETATION  CT CHEST The following report is an over-read performed by radiologist Dr. Royal Piedra [facility] on 07/11/2018. This  over-read does not include interpretation of cardiac or coronary anatomy or pathology. The coronary CTA interpretation by the cardiologist is attached. COMPARISON:  None. FINDINGS: Extracardiac findings will be described separately under dictation for contemporaneously obtained CTA chest, abdomen and pelvis. IMPRESSION: Please see separate dictation for contemporaneously obtained CTA chest, abdomen and pelvis dated 07/11/2018 for full description of extracardiac findings. Electronically Signed   By: Trudie Reed M.D.   On: 07/11/2018 13:42   Result Date: 07/11/2018 CLINICAL DATA:  Aortic Stenosis EXAM: Cardiac TAVR CT TECHNIQUE: The patient was scanned on a Siemens Force 192 slice scanner. A 120 kV retrospective scan was triggered in the ascending thoracic aorta at 140 HU's. Gantry rotation speed was 250 msecs and collimation was .6 mm. No beta blockade or nitro were given. The 3D data set was reconstructed in 5% intervals of the R-R cycle. Systolic and diastolic phases were analyzed on a dedicated work station using MPR, MIP and VRT modes. The patient received 80 cc of contrast. FINDINGS: Aortic Valve: Trileaflet severely calcified with restricted motion Aorta: Calcific atherosclerosis with no aneurysm Sino-tubular Junction: 25 mm Ascending Thoracic Aorta: 31 mm Aortic Arch: 29 mm Descending Thoracic Aorta: 25 mm Sinus of Valsalva Measurements: Non-coronary: 29.2 mm Right - coronary: 27.8 mm Left -   coronary: 29.4 mm Coronary Artery Height above Annulus: Left Main: 13 mm above annulus Right Coronary: 11.2 mm above annulus Virtual Basal Annulus Measurements: Maximum / Minimum Diameter: 23.5 mm x 26.5 mm Perimeter: 81.2 mm Area: 536 mm2 Coronary Arteries: Sufficient height above annulus for deployment Optimum Fluoroscopic Angle for Delivery: LAO 13 degrees Caudal 4 degrees IMPRESSION: 1. Calcified tri leaflet AV with annular area of 536 mm2 suitable for a 26 mm Sapien 3 valve 2. Optimum angiographic angle for  deployment LAO 13 degrees Caudal 4 degrees 3.  Coronary arteries sufficient height above annulus for deployment 4.  Severe bi atrial enlargement cannot r/o laminated LAA thrombus 5.  Normal aortic root 3.1 cm Charlton Haws Electronically Signed: By: Charlton Haws M.D. On: 07/11/2018 13:09   Dg Chest Port 1 View  Result Date: 07/07/2018 CLINICAL DATA:  Pulmonary edema. EXAM: PORTABLE CHEST 1 VIEW COMPARISON:  06/10/2018 FINDINGS: 0552 hours. The cardio pericardial silhouette is enlarged. Vascular congestion with basilar predominant interstitial opacity compatible with dependent interstitial edema. No substantial pleural effusion. Telemetry leads overlie the chest. IMPRESSION: Cardiomegaly with vascular congestion and likely interstitial edema. Electronically Signed   By: Jamison Oka.D.  On: 07/07/2018 08:56   Ct Angio Chest Aorta W/cm &/or Wo/cm  Result Date: 07/11/2018 CLINICAL DATA:  67 year old male with history of severe aortic stenosis. Preprocedural study prior to potential transcatheter aortic valve replacement (TAVR) procedure. EXAM: CT ANGIOGRAPHY CHEST, ABDOMEN AND PELVIS TECHNIQUE: Multidetector CT imaging through the chest, abdomen and pelvis was performed using the standard protocol during bolus administration of intravenous contrast. Multiplanar reconstructed images and MIPs were obtained and reviewed to evaluate the vascular anatomy. CONTRAST:  ISOVUE-370 IOPAMIDOL (ISOVUE-370) INJECTION 76% COMPARISON:  No priors. FINDINGS: CTA CHEST FINDINGS Cardiovascular: Heart size is severely enlarged with severe left atrial dilatation. There is no significant pericardial fluid, thickening or pericardial calcification. There is aortic atherosclerosis, as well as atherosclerosis of the great vessels of the mediastinum and the coronary arteries, including calcified atherosclerotic plaque in the left main, left anterior descending, left circumflex and right coronary arteries. Severe calcifications  of the aortic valve. Moderate calcifications of the mitral annulus. Dilatation of the pulmonic trunk (4.5 cm in diameter). Mediastinum/Lymph Nodes: No pathologically enlarged mediastinal or hilar lymph nodes. Esophagus is unremarkable in appearance. No axillary lymphadenopathy. Lungs/Pleura: Trace right pleural effusion lying dependently. Several small pulmonary nodules are noted in the right lung, largest of which is in the medial aspect of the right lower lobe measuring 5 mm (axial image 78 of series 15). No acute consolidative airspace disease. Musculoskeletal/Soft Tissues: There are no aggressive appearing lytic or blastic lesions noted in the visualized portions of the skeleton. CTA ABDOMEN AND PELVIS FINDINGS Hepatobiliary: Liver has a nodular contour, suggesting underlying cirrhosis. No suspicious cystic or solid hepatic lesions. No intra or extrahepatic biliary ductal dilatation. Gallbladder is moderately distended, but otherwise unremarkable in appearance. Pancreas: No pancreatic mass. No pancreatic or peripancreatic fluid or inflammatory changes. Spleen: Unremarkable. Adrenals/Urinary Tract: 2 mm nonobstructive calculus in the interpolar collecting system of the right kidney. Extensive scarring in the right kidney. 3 mm nonobstructive calculus in the lower pole collecting system of the left kidney. Subcentimeter low-attenuation lesion in the interpolar region of the left kidney, too small to characterize, but statistically likely to represent a cyst. No hydroureteronephrosis. Urinary bladder is largely obscured by beam hardening artifact fact from the patient's right hip arthroplasty, but is unremarkable in appearance. Stomach/Bowel: status post Roux-en-Y gastric bypass. No pathologic dilatation of small bowel or colon. Numerous colonic diverticulae are noted, particularly in the descending colon and sigmoid colon, without surrounding inflammatory changes to suggest an acute diverticulitis at this time.  Normal appendix. Vascular/Lymphatic: Aortic atherosclerosis, with vascular findings and measurements pertinent to potential TAVR procedure, as detailed below. Please note, that the extensive beam hardening artifact in the associated image noise does limit assessment of this examination (as detailed below). No lymphadenopathy identified in the abdomen or pelvis. Reproductive: Prostate gland and seminal vesicles are completely obscured by beam hardening artifact. Other: No significant volume of ascites.  No pneumoperitoneum. Musculoskeletal: Status post right hip arthroplasty. There are no aggressive appearing lytic or blastic lesions noted in the visualized portions of the skeleton. VASCULAR MEASUREMENTS PERTINENT TO TAVR: AORTA: Minimal Aortic Diameter-19 x 17 mm Severity of Aortic Calcification-mild RIGHT PELVIS: Right Common Iliac Artery - Minimal Diameter-9.7 x 9.9 mm Tortuosity-mild Calcification - moderate Right External Iliac Artery - Comment: A portion of the distal vessel is completely obscurred by beam hardening artifact from patient's right hip arthroplasty, and cannot be adequately evaluated. Minimal Diameter-8.3 x 7.3 mm Tortuosity-moderate Calcification - moderate Right Common Femoral Artery - Comment: A portion of  the proximal vessel is completely obscurred by beam hardening artifact from patient's right hip arthroplasty, and cannot be adequately evaluated. Minimal Diameter-9.3 x 6.0 mm Tortuosity-mild Calcification - moderate LEFT PELVIS: Left Common Iliac Artery - Minimal Diameter-11.2 x 9.0 mm Tortuosity-mild Calcification - moderate Left External Iliac Artery - Minimal Diameter-8.7 x 7.2 mm Tortuosity-moderate Calcification - moderate Left Common Femoral Artery - Comment: Poorly evaluated secondary to extensive image noise. Minimal Diameter-8.7 x 6.3 mm Tortuosity-mild Calcification - moderate Review of the MIP images confirms the above findings. IMPRESSION: 1. Vascular findings and measurements  pertinent to potential TAVR procedure, as detailed above. Please note that portions of today's examination were severely limited by beam hardening artifact from the patient's right hip arthroplasty, as indicated above. 2. Severe thickening calcification of the aortic valve, compatible with the reported clinical history of severe aortic stenosis. 3. Moderate calcification of the mitral annulus. 4. Cardiomegaly with severe left atrial dilatation. 5. Aortic atherosclerosis, in addition to left main and 3 vessel coronary artery disease. Please note that although the presence of coronary artery calcium documents the presence of coronary artery disease, the severity of this disease and any potential stenosis cannot be assessed on this non-gated CT examination. Assessment for potential risk factor modification, dietary therapy or pharmacologic therapy may be warranted, if clinically indicated. 6. Severe dilatation of the pulmonic trunk (4.5 cm in diameter), strongly suggestive of pulmonary arterial hypertension. 7. Trace right pleural effusion lying dependently. 8. Multiple small pulmonary nodules in the right lung measuring 5 mm or less in size. These are nonspecific, but are statistically likely benign. No follow-up needed if patient is low-risk (and has no known or suspected primary neoplasm). Non-contrast chest CT can be considered in 12 months if patient is high-risk. This recommendation follows the consensus statement: Guidelines for Management of Incidental Pulmonary Nodules Detected on CT Images: From the Fleischner Society 2017; Radiology 2017; 284:228-243. 9. Small nonobstructive calculi in the collecting systems of both kidneys. 10. Additional incidental findings, as above. Electronically Signed   By: Trudie Reed M.D.   On: 07/11/2018 14:48   Ct Angio Abd/pel W/ And/or W/o  Result Date: 07/11/2018 CLINICAL DATA:  67 year old male with history of severe aortic stenosis. Preprocedural study prior to  potential transcatheter aortic valve replacement (TAVR) procedure. EXAM: CT ANGIOGRAPHY CHEST, ABDOMEN AND PELVIS TECHNIQUE: Multidetector CT imaging through the chest, abdomen and pelvis was performed using the standard protocol during bolus administration of intravenous contrast. Multiplanar reconstructed images and MIPs were obtained and reviewed to evaluate the vascular anatomy. CONTRAST:  ISOVUE-370 IOPAMIDOL (ISOVUE-370) INJECTION 76% COMPARISON:  No priors. FINDINGS: CTA CHEST FINDINGS Cardiovascular: Heart size is severely enlarged with severe left atrial dilatation. There is no significant pericardial fluid, thickening or pericardial calcification. There is aortic atherosclerosis, as well as atherosclerosis of the great vessels of the mediastinum and the coronary arteries, including calcified atherosclerotic plaque in the left main, left anterior descending, left circumflex and right coronary arteries. Severe calcifications of the aortic valve. Moderate calcifications of the mitral annulus. Dilatation of the pulmonic trunk (4.5 cm in diameter). Mediastinum/Lymph Nodes: No pathologically enlarged mediastinal or hilar lymph nodes. Esophagus is unremarkable in appearance. No axillary lymphadenopathy. Lungs/Pleura: Trace right pleural effusion lying dependently. Several small pulmonary nodules are noted in the right lung, largest of which is in the medial aspect of the right lower lobe measuring 5 mm (axial image 78 of series 15). No acute consolidative airspace disease. Musculoskeletal/Soft Tissues: There are no aggressive appearing lytic or  blastic lesions noted in the visualized portions of the skeleton. CTA ABDOMEN AND PELVIS FINDINGS Hepatobiliary: Liver has a nodular contour, suggesting underlying cirrhosis. No suspicious cystic or solid hepatic lesions. No intra or extrahepatic biliary ductal dilatation. Gallbladder is moderately distended, but otherwise unremarkable in appearance. Pancreas: No  pancreatic mass. No pancreatic or peripancreatic fluid or inflammatory changes. Spleen: Unremarkable. Adrenals/Urinary Tract: 2 mm nonobstructive calculus in the interpolar collecting system of the right kidney. Extensive scarring in the right kidney. 3 mm nonobstructive calculus in the lower pole collecting system of the left kidney. Subcentimeter low-attenuation lesion in the interpolar region of the left kidney, too small to characterize, but statistically likely to represent a cyst. No hydroureteronephrosis. Urinary bladder is largely obscured by beam hardening artifact fact from the patient's right hip arthroplasty, but is unremarkable in appearance. Stomach/Bowel: status post Roux-en-Y gastric bypass. No pathologic dilatation of small bowel or colon. Numerous colonic diverticulae are noted, particularly in the descending colon and sigmoid colon, without surrounding inflammatory changes to suggest an acute diverticulitis at this time. Normal appendix. Vascular/Lymphatic: Aortic atherosclerosis, with vascular findings and measurements pertinent to potential TAVR procedure, as detailed below. Please note, that the extensive beam hardening artifact in the associated image noise does limit assessment of this examination (as detailed below). No lymphadenopathy identified in the abdomen or pelvis. Reproductive: Prostate gland and seminal vesicles are completely obscured by beam hardening artifact. Other: No significant volume of ascites.  No pneumoperitoneum. Musculoskeletal: Status post right hip arthroplasty. There are no aggressive appearing lytic or blastic lesions noted in the visualized portions of the skeleton. VASCULAR MEASUREMENTS PERTINENT TO TAVR: AORTA: Minimal Aortic Diameter-19 x 17 mm Severity of Aortic Calcification-mild RIGHT PELVIS: Right Common Iliac Artery - Minimal Diameter-9.7 x 9.9 mm Tortuosity-mild Calcification - moderate Right External Iliac Artery - Comment: A portion of the distal vessel  is completely obscurred by beam hardening artifact from patient's right hip arthroplasty, and cannot be adequately evaluated. Minimal Diameter-8.3 x 7.3 mm Tortuosity-moderate Calcification - moderate Right Common Femoral Artery - Comment: A portion of the proximal vessel is completely obscurred by beam hardening artifact from patient's right hip arthroplasty, and cannot be adequately evaluated. Minimal Diameter-9.3 x 6.0 mm Tortuosity-mild Calcification - moderate LEFT PELVIS: Left Common Iliac Artery - Minimal Diameter-11.2 x 9.0 mm Tortuosity-mild Calcification - moderate Left External Iliac Artery - Minimal Diameter-8.7 x 7.2 mm Tortuosity-moderate Calcification - moderate Left Common Femoral Artery - Comment: Poorly evaluated secondary to extensive image noise. Minimal Diameter-8.7 x 6.3 mm Tortuosity-mild Calcification - moderate Review of the MIP images confirms the above findings. IMPRESSION: 1. Vascular findings and measurements pertinent to potential TAVR procedure, as detailed above. Please note that portions of today's examination were severely limited by beam hardening artifact from the patient's right hip arthroplasty, as indicated above. 2. Severe thickening calcification of the aortic valve, compatible with the reported clinical history of severe aortic stenosis. 3. Moderate calcification of the mitral annulus. 4. Cardiomegaly with severe left atrial dilatation. 5. Aortic atherosclerosis, in addition to left main and 3 vessel coronary artery disease. Please note that although the presence of coronary artery calcium documents the presence of coronary artery disease, the severity of this disease and any potential stenosis cannot be assessed on this non-gated CT examination. Assessment for potential risk factor modification, dietary therapy or pharmacologic therapy may be warranted, if clinically indicated. 6. Severe dilatation of the pulmonic trunk (4.5 cm in diameter), strongly suggestive of pulmonary  arterial hypertension. 7. Trace right pleural  effusion lying dependently. 8. Multiple small pulmonary nodules in the right lung measuring 5 mm or less in size. These are nonspecific, but are statistically likely benign. No follow-up needed if patient is low-risk (and has no known or suspected primary neoplasm). Non-contrast chest CT can be considered in 12 months if patient is high-risk. This recommendation follows the consensus statement: Guidelines for Management of Incidental Pulmonary Nodules Detected on CT Images: From the Fleischner Society 2017; Radiology 2017; 284:228-243. 9. Small nonobstructive calculi in the collecting systems of both kidneys. 10. Additional incidental findings, as above. Electronically Signed   By: Trudie Reed M.D.   On: 07/11/2018 14:48   Disposition   Pt is being discharged home today in good condition.  Follow-up Plans & Appointments    Follow-up Information    Call Charlynne Pander, DDS.   Specialty:  Dentistry Why:  Patient to call for follow-up evaluation and for suture removal in 7 to 10 days. Contact information: [address] [name] [address] [phone]        [facility] Follow up on 07/25/2018.   Specialty:  Cardiology Why:  at 1100 am for post hospital follow up. Code for parking is 1500. Psychologist, sport and exercise thru [employer] off of [address]. undergound parking is on your right. Can also park in lower ED lot and enter blue awning.  Contact information: [address] [id number] mc [place] [address] [phone]       Alleen Borne, MD Follow up.   Specialty:  Cardiothoracic Surgery Why:  As scheduled Contact information: [address] [employer] [address] [name] [address] [phone]          Discharge Instructions    (HEART FAILURE PATIENTS) Call MD:  Anytime you have any of the following symptoms: 1) 3 pound weight gain in 24 hours or 5 pounds in 1 week 2)  shortness of breath, with or without a dry hacking cough 3) swelling in the hands, feet or stomach 4) if you have to sleep on extra pillows at night in order to breathe.   Complete by:  As directed    Diet - low sodium heart healthy   Complete by:  As directed    Increase activity slowly   Complete by:  As directed       Discharge Medications   Allergies as of 07/16/2018   No Known Allergies     Medication List    STOP taking these medications   aspirin EC 81 MG tablet   colchicine 0.6 MG tablet   ibuprofen 800 MG tablet Commonly known as:  ADVIL,MOTRIN   potassium chloride SA 20 MEQ tablet Commonly known as:  K-DUR,KLOR-CON     TAKE these medications   acetaminophen 650 MG CR tablet Commonly known as:  TYLENOL Take 650 mg by mouth 2 (two) times daily as needed for pain.   atorvastatin 80 MG tablet Commonly known as:  LIPITOR TAKE ONE TABLET BY MOUTH ONCE DAILY WITH BREAKFAST What changed:  See the new instructions.   diltiazem 120 MG 24 hr capsule Commonly known as:  CARDIZEM CD Take 1 capsule (120 mg total) by mouth daily.   ferrous sulfate 325 (65 FE) MG tablet Take 1 tablet (325 mg total) by mouth 3 (three) times daily.   spironolactone 25 MG tablet Commonly known as:  ALDACTONE Take 0.5 tablets (12.5 mg total) by mouth daily.   torsemide 20 MG tablet Commonly known as:  DEMADEX  Take 2 tablets (40 mg total) by mouth daily. What changed:  when to take this Notes to patient:  Do NOT take if your weight is below 245 pounds   Vitamin D (Ergocalciferol) 50000 units Caps capsule Commonly known as:  DRISDOL TAKE ONE CAPSULE BY MOUTH EVERY 7 DAYS What changed:  See the new instructions.           Outstanding Labs/Studies   None  Duration of Discharge Encounter   Greater than 30 minutes including physician time.  Bobbye Riggs Barrett PA 07/16/2018, 11:16 AM   Patient seen and examined with the above-signed Advanced Practice Provider and/or  Housestaff. I personally reviewed laboratory data, imaging studies and relevant notes. I independently examined the patient and formulated the important aspects of the plan. I have edited the note to reflect any of my changes or salient points. I have personally discussed the plan with the patient and/or family.  He is ok for d/c. (see my rounding notes for further details). Will f/u in HF Clinic this week.   Arvilla Meres, MD  2:50 PM

## 2018-07-16 NOTE — Progress Notes (Signed)
Advanced Heart Failure Rounding Note  PCP-Cardiologist: No primary care provider on file.   Subjective:    S/p multiple teeth extractions 07/13/18. Off diuretics due to rising creatinine Weight down another 3 pounds (Down 37 lbs from admit)  Feels good today. Eager to go home. BP and HR stable.    Objective:   Weight Range: 109.5 kg Body mass index is 32.74 kg/m.   Vital Signs:   Temp:  [97.1 F (36.2 C)-98.9 F (37.2 C)] 98.6 F (37 C) (08/25 0830) Pulse Rate:  [75-81] 80 (08/25 0830) Resp:  [12-14] 13 (08/25 0830) BP: (91-126)/(66-82) 117/80 (08/25 0830) SpO2:  [95 %-99 %] 99 % (08/25 0830) Weight:  [109.5 kg] 109.5 kg (08/25 0500) Last BM Date: 07/15/18  Weight change: Filed Weights   07/14/18 0558 07/15/18 0600 07/16/18 0500  Weight: 112 kg 111.1 kg 109.5 kg    Intake/Output:   Intake/Output Summary (Last 24 hours) at 07/16/2018 1000 Last data filed at 07/16/2018 0000 Gross per 24 hour  Intake 866.45 ml  Output -  Net 866.45 ml    Physical Exam   General:  Well appearing. No resp difficulty HEENT: normal Neck: supple. no JVD. Carotids 2+ bilat; + bruits. No lymphadenopathy or thryomegaly appreciated. Cor: PMI nondisplaced. Irregular rate & rhythm. 2/6 AS Lungs: clear Abdomen: soft, nontender, nondistended. No hepatosplenomegaly. No bruits or masses. Good bowel sounds. Extremities: no cyanosis, clubbing, rash, edema Neuro: alert & orientedx3, cranial nerves grossly intact. moves all 4 extremities w/o difficulty. Affect pleasant   Telemetry   Afib 80-90s Personally reviewed   EKG    No new tracings.    Labs    CBC No results for input(s): WBC, NEUTROABS, HGB, HCT, MCV, PLT in the last 72 hours. Basic Metabolic Panel Recent Labs    16/10/96 0630 07/16/18 0333  NA 135 136  K 4.7 4.3  CL 96* 98  CO2 28 28  GLUCOSE 113* 111*  BUN 42* 43*  CREATININE 1.92* 1.52*  CALCIUM 9.6 9.2   Liver Function Tests No results for input(s): AST,  ALT, ALKPHOS, BILITOT, PROT, ALBUMIN in the last 72 hours. No results for input(s): LIPASE, AMYLASE in the last 72 hours. Cardiac Enzymes No results for input(s): CKTOTAL, CKMB, CKMBINDEX, TROPONINI in the last 72 hours.  BNP: BNP (last 3 results) Recent Labs    06/10/18 2322 07/06/18 1810  BNP 685.0* 654.2*    ProBNP (last 3 results) No results for input(s): PROBNP in the last 8760 hours.   D-Dimer No results for input(s): DDIMER in the last 72 hours. Hemoglobin A1C No results for input(s): HGBA1C in the last 72 hours. Fasting Lipid Panel No results for input(s): CHOL, HDL, LDLCALC, TRIG, CHOLHDL, LDLDIRECT in the last 72 hours. Thyroid Function Tests No results for input(s): TSH, T4TOTAL, T3FREE, THYROIDAB in the last 72 hours.  Invalid input(s): FREET3  Other results:   Imaging   No results found.  Medications:     Scheduled Medications: . atorvastatin  80 mg Oral q1800  . diltiazem  120 mg Oral Daily  . enoxaparin (LOVENOX) injection  60 mg Subcutaneous Q24H  . feeding supplement  1 Container Oral BID BM  . sodium chloride flush  3 mL Intravenous Q12H  . sodium chloride flush  3 mL Intravenous Q12H  . sodium chloride flush  3 mL Intravenous Q12H    Infusions: . sodium chloride    . sodium chloride    . sodium chloride    .  lactated ringers 10 mL/hr at 07/16/18 0000    PRN Medications: sodium chloride, sodium chloride, sodium chloride, acetaminophen, morphine injection, ondansetron (ZOFRAN) IV, oxyCODONE-acetaminophen, sodium chloride flush, sodium chloride flush, sodium chloride flush    Patient Profile   Joel Rogers is a 67 y.o. male with h/o morbid obesity, pulmonary HTN, chronic a fibrillation, sleep apnea, HTN, HLD, chronic venous insufficiency, chronic diastolic CHF, and severe Aortic stenosis.   [facility] L/RHC. Aborted due to resp distress requiring BiPAP in setting of A/C diastolic CHF and pulmonary  hypertension.  Assessment/Plan   1. Acute on chronic diastolic (congestive) heart failure  - Decompensated in cath lab 07/06/18 requiring BiPAP and Nitro gtt for hypertensive emergency.  - Has diuresed 37 pounds. Much improved. - Off diuretics now and anti-HTN agents due to AKI and low BP - Normal coronaries but severe pulmonary venous HTN with high output state by cath 07/10/18. 2. Acute hypoxic resp distress - Resolved with Bipap and diuresis. On 2 L [place].   3. Hypertensive Emergency - BP low after diuresis.  Stopped losartan and spiro yesterday with low BP and rising creatinine. Improved today 4. Severe aortic stenosis - Echo March 2019 EF 60% - severe AS, AVA 0.88, peak 76 mmHg, pulmonary HTN- PA 92 mmHg - Echo 07/07/18 EF 65-70% Severe AS Mean gradient (S): 57 mm Hg. Peak(S): 100 mm Hg. RVSP 66mmHG - [facility].  - Structural team consulted. TAVR planned for 9/3 - CTs and PFTs reviewed 5. Morbid obesity  - Body mass index is 32.74 kg/m.  - h/o gastric bypass with significant (lost 140 lbs) 6. Obstructive sleep apnea - OSA improved on sleep study 05/16/18, likely with weight loss.  - Has stated he no longer needs CPAP based on recent sleep study. No change.   7. Chronic atrial fibrillation  - Taken off anticoagulation secondary to SDH after a fall Dec 2017- - Rate improved with diuresi - Reconsider AC at some point.  8. Poor dentition - Seen by Dr. Kristin BruinsKulinski. Went for multiple teeth extractions 07/13/18. - Pain well controlled this am.  9. Hypokalemia - Resolved   Home today on the following meds  Atorva 80 Cardizem 120 Torsemide 40 daily (lower dose than previous) - do not take if weight below 245 Spiro 12.5 daily (new) Iron   Stop: ASA   F/U: HF Clinic as scheduled this week with BMET         TAVR 9/3  Length of Stay: 10  Joel Meresaniel Isack Lavalley, MD  07/16/2018, 10:00 AM  Advanced Heart Failure Team Pager [phone] (M-F; 7a - 4p)  Please  contact [facility] for night-coverage after hours (4p -7a ) and weekends on [email or web address]

## 2018-07-17 ENCOUNTER — Encounter: Payer: Self-pay | Admitting: Physician Assistant

## 2018-07-17 ENCOUNTER — Other Ambulatory Visit: Payer: Self-pay

## 2018-07-17 DIAGNOSIS — I350 Nonrheumatic aortic (valve) stenosis: Secondary | ICD-10-CM

## 2018-07-19 ENCOUNTER — Ambulatory Visit (HOSPITAL_COMMUNITY): Payer: Medicare Other

## 2018-07-19 ENCOUNTER — Ambulatory Visit: Payer: Medicare Other | Admitting: Physical Therapy

## 2018-07-19 ENCOUNTER — Encounter (HOSPITAL_COMMUNITY): Payer: Medicare Other

## 2018-07-20 ENCOUNTER — Encounter: Payer: Medicare Other | Admitting: Surgery

## 2018-07-20 ENCOUNTER — Other Ambulatory Visit: Payer: Self-pay

## 2018-07-20 ENCOUNTER — Ambulatory Visit: Payer: Medicare Other | Admitting: Physical Therapy

## 2018-07-20 MED ORDER — VITAMIN D (ERGOCALCIFEROL) 1.25 MG (50000 UNIT) PO CAPS: ORAL_CAPSULE | ORAL | 1 refills | Status: DC

## 2018-07-20 NOTE — Pre-Procedure Instructions (Signed)
Joel Rogers  07/20/2018      [facility] - [place], [place] - [address] [name] [address] Phone: [phone] Fax: [phone]  [facility] - [place], [place] - [address] [place] [address] Phone: [phone] Fax: [phone]    Your procedure is scheduled on Tuesday September 3rd.  Report to [facility] Admitting at 0530 A.M.  Call this number if you have problems the morning of surgery:  [phone]   Remember:  Do not eat or drink after midnight.     Do not take any medications the morning of surgery.  7 days prior to surgery STOP taking any Aspirin(unless otherwise instructed by your surgeon), Aleve, Naproxen, Ibuprofen, Motrin, Advil, Goody's, BC's, all herbal medications, fish oil, and all vitamins     Do not wear jewelry.  Do not wear lotions, powders, or colognes, or deodorant.  Do not shave 48 hours prior to surgery.  Men may shave face and neck.  Do not bring valuables to the hospital.  [facility] is not responsible for any belongings or valuables.  Contacts, dentures or bridgework may not be worn into surgery.  Leave your suitcase in the car.  After surgery it may be brought to your room.  For patients admitted to the hospital, discharge time will be determined by your treatment team.  Patients discharged the day of surgery will not be allowed to drive home.    - Preparing For Surgery  Before surgery, you can play an important role. Because skin is not sterile, your skin needs to be as free of germs as possible. You can reduce the number of germs on your skin by washing with CHG (chlorahexidine gluconate) Soap before surgery.  CHG is an antiseptic cleaner which kills germs and bonds with the skin to continue killing germs even after washing.    Oral Hygiene is also important to reduce your risk of infection.  Remember - BRUSH YOUR TEETH THE  MORNING OF SURGERY WITH YOUR REGULAR TOOTHPASTE  Please do not use if you have an allergy to CHG or antibacterial soaps. If your skin becomes reddened/irritated stop using the CHG.  Do not shave (including legs and underarms) for at least 48 hours prior to first CHG shower. It is OK to shave your face.  Please follow these instructions carefully.   1. Shower the NIGHT BEFORE SURGERY and the MORNING OF SURGERY with CHG.   2. If you chose to wash your hair, wash your hair first as usual with your normal shampoo.  3. After you shampoo, rinse your hair and body thoroughly to remove the shampoo.  4. Use CHG as you would any other liquid soap. You can apply CHG directly to the skin and wash gently with a scrungie or a clean washcloth.   5. Apply the CHG Soap to your body ONLY FROM THE NECK DOWN.  Do not use on open wounds or open sores. Avoid contact with your eyes, ears, mouth and genitals (private parts). Wash Face and genitals (private parts)  with your normal soap.  6. Wash thoroughly, paying special attention to the area where your surgery will be performed.  7. Thoroughly rinse your body with warm water from the neck down.  8. DO NOT shower/wash with your normal soap after using and rinsing off the CHG Soap.  9. Pat yourself dry with a CLEAN TOWEL.  10. Wear  CLEAN PAJAMAS to bed the night before surgery, wear comfortable clothes the morning of surgery  11. Place CLEAN SHEETS on your bed the night of your first shower and DO NOT SLEEP WITH PETS.    Day of Surgery:  Do not apply any deodorants/lotions.  Please wear clean clothes to the hospital/surgery center.   Remember to brush your teeth WITH YOUR REGULAR TOOTHPASTE.    Please read over the following fact sheets that you were given. Coughing and Deep Breathing, Blood Transfusion Information, MRSA Information and Surgical Site Infection Prevention

## 2018-07-21 ENCOUNTER — Telehealth: Payer: Self-pay | Admitting: *Deleted

## 2018-07-21 ENCOUNTER — Other Ambulatory Visit: Payer: Self-pay

## 2018-07-21 ENCOUNTER — Ambulatory Visit (HOSPITAL_COMMUNITY)
Admission: RE | Admit: 2018-07-21 | Discharge: 2018-07-21 | Disposition: A | Discharge status: Home / Self Care | Payer: Medicare Other | Source: Ambulatory Visit | Attending: Cardiovascular Disease | Admitting: Cardiovascular Disease

## 2018-07-21 ENCOUNTER — Encounter (HOSPITAL_COMMUNITY): Payer: Self-pay

## 2018-07-21 ENCOUNTER — Encounter (HOSPITAL_COMMUNITY)
Admission: RE | Admit: 2018-07-21 | Discharge: 2018-07-21 | Disposition: A | Discharge status: Home / Self Care | Payer: Medicare Other | Source: Ambulatory Visit | Attending: Cardiovascular Disease | Admitting: Cardiovascular Disease

## 2018-07-21 DIAGNOSIS — Z01810 Encounter for preprocedural cardiovascular examination: Secondary | ICD-10-CM | POA: Insufficient documentation

## 2018-07-21 DIAGNOSIS — I4510 Unspecified right bundle-branch block: Secondary | ICD-10-CM | POA: Diagnosis not present

## 2018-07-21 DIAGNOSIS — I517 Cardiomegaly: Secondary | ICD-10-CM | POA: Diagnosis not present

## 2018-07-21 DIAGNOSIS — I878 Other specified disorders of veins: Secondary | ICD-10-CM | POA: Insufficient documentation

## 2018-07-21 DIAGNOSIS — I4891 Unspecified atrial fibrillation: Secondary | ICD-10-CM | POA: Insufficient documentation

## 2018-07-21 DIAGNOSIS — Z01818 Encounter for other preprocedural examination: Secondary | ICD-10-CM | POA: Insufficient documentation

## 2018-07-21 DIAGNOSIS — I350 Nonrheumatic aortic (valve) stenosis: Secondary | ICD-10-CM | POA: Insufficient documentation

## 2018-07-21 HISTORY — DX: Anemia, unspecified: D64.9

## 2018-07-21 LAB — BLOOD GAS, ARTERIAL
Acid-base deficit: 0.5 mmol/L (ref 0.0–2.0)
BICARBONATE: 23.2 mmol/L (ref 20.0–28.0)
Drawn by: 449841
FIO2: 21
O2 Saturation: 97.9 %
PCO2 ART: 35.3 mmHg (ref 32.0–48.0)
PH ART: 7.433 (ref 7.350–7.450)
PO2 ART: 113 mmHg — AB (ref 83.0–108.0)
Patient temperature: 98.6

## 2018-07-21 LAB — URINALYSIS, ROUTINE W REFLEX MICROSCOPIC
Bilirubin Urine: NEGATIVE
Glucose, UA: NEGATIVE mg/dL
Hgb urine dipstick: NEGATIVE
KETONES UR: NEGATIVE mg/dL
LEUKOCYTES UA: NEGATIVE
Nitrite: NEGATIVE
PH: 5 (ref 5.0–8.0)
Protein, ur: 100 mg/dL — AB
Specific Gravity, Urine: 1.019 (ref 1.005–1.030)

## 2018-07-21 LAB — COMPREHENSIVE METABOLIC PANEL
ALK PHOS: 142 U/L — AB (ref 38–126)
ALT: 20 U/L (ref 0–44)
ANION GAP: 12 (ref 5–15)
AST: 32 U/L (ref 15–41)
Albumin: 3.4 g/dL — ABNORMAL LOW (ref 3.5–5.0)
BILIRUBIN TOTAL: 1.9 mg/dL — AB (ref 0.3–1.2)
BUN: 12 mg/dL (ref 8–23)
CALCIUM: 9.6 mg/dL (ref 8.9–10.3)
CO2: 19 mmol/L — AB (ref 22–32)
CREATININE: 0.93 mg/dL (ref 0.61–1.24)
Chloride: 105 mmol/L (ref 98–111)
GFR calc non Af Amer: >60 mL/min (ref >60)
GLUCOSE: 110 mg/dL — AB (ref 70–99)
Potassium: 3.9 mmol/L (ref 3.5–5.1)
Sodium: 136 mmol/L (ref 135–145)
TOTAL PROTEIN: 7.6 g/dL (ref 6.5–8.1)

## 2018-07-21 LAB — CBC
HEMATOCRIT: 40.4 % (ref 39.0–52.0)
HEMOGLOBIN: 13.2 g/dL (ref 13.0–17.0)
MCH: 24.3 pg — ABNORMAL LOW (ref 26.0–34.0)
MCHC: 32.7 g/dL (ref 30.0–36.0)
MCV: 74.4 fL — ABNORMAL LOW (ref 78.0–100.0)
Platelets: 312 10*3/uL (ref 150–400)
RBC: 5.43 MIL/uL (ref 4.22–5.81)
RDW: 17.3 % — ABNORMAL HIGH (ref 11.5–15.5)
WBC: 8.5 10*3/uL (ref 4.0–10.5)

## 2018-07-21 LAB — BRAIN NATRIURETIC PEPTIDE: B Natriuretic Peptide: 376.1 pg/mL — ABNORMAL HIGH (ref 0.0–100.0)

## 2018-07-21 LAB — PROTIME-INR
INR: 1.12
PROTHROMBIN TIME: 14.3 s (ref 11.4–15.2)

## 2018-07-21 LAB — TYPE AND SCREEN
ABO/RH(D): O POS
Antibody Screen: NEGATIVE

## 2018-07-21 LAB — APTT: aPTT: 32 seconds (ref 24–36)

## 2018-07-21 LAB — HEMOGLOBIN A1C
Hgb A1c MFr Bld: 5.8 % — ABNORMAL HIGH (ref 4.8–5.6)
MEAN PLASMA GLUCOSE: 119.76 mg/dL

## 2018-07-21 NOTE — Telephone Encounter (Signed)
PATIENT WALKED IN LEFT FORM  TO BE FILLED OUT   ENVELOPE GIVEN TO COVERING RN FOR DR Allyson SabalBERRY

## 2018-07-21 NOTE — Progress Notes (Signed)
PCP - Allayne ButcherMary Dixon PA Cardiologist -   Chest x-ray -  07/21/18 EKG - 07/21/18 ECHO - 07/07/18 Cardiac Cath - 07/06/18, 07/10/18  Sleep Study - 05/16/18 CPAP -  Does not use  Blood Thinner Instructions: N/A Aspirin Instructions: N/A  Anesthesia review: yes, cardiac hx  Patient denies shortness of breath, fever, cough and chest pain at PAT appointment   Patient verbalized understanding of instructions that were given to them at the PAT appointment. Patient was also instructed that they will need to review over the PAT instructions again at home before surgery.

## 2018-07-23 MED ORDER — POTASSIUM CHLORIDE 2 MEQ/ML IV SOLN
80.0000 meq | INTRAVENOUS | Status: DC
  Filled 2018-07-23: qty 40

## 2018-07-23 MED ORDER — SODIUM CHLORIDE 0.9 % IV SOLN
1.5000 g | INTRAVENOUS | Status: AC
  Administered 2018-07-25: 1.5 g via INTRAVENOUS
  Filled 2018-07-23: qty 1.5

## 2018-07-23 MED ORDER — SODIUM CHLORIDE 0.9 % IV SOLN
30.0000 ug/min | INTRAVENOUS | Status: DC
  Filled 2018-07-23: qty 2

## 2018-07-23 MED ORDER — EPINEPHRINE PF 1 MG/ML IJ SOLN
0.0000 ug/min | INTRAVENOUS | Status: DC
  Filled 2018-07-23: qty 4

## 2018-07-23 MED ORDER — SODIUM CHLORIDE 0.9 % IV SOLN
INTRAVENOUS | Status: DC
  Filled 2018-07-23: qty 1

## 2018-07-23 MED ORDER — SODIUM CHLORIDE 0.9 % IV SOLN
INTRAVENOUS | Status: DC
  Filled 2018-07-23: qty 30

## 2018-07-23 MED ORDER — VANCOMYCIN HCL 10 G IV SOLR
1500.0000 mg | INTRAVENOUS | Status: AC
  Administered 2018-07-25: 1500 mg via INTRAVENOUS
  Filled 2018-07-23: qty 1500

## 2018-07-23 MED ORDER — NOREPINEPHRINE 4 MG/250ML-% IV SOLN
0.0000 ug/min | INTRAVENOUS | Status: DC
  Filled 2018-07-23: qty 250

## 2018-07-23 MED ORDER — MAGNESIUM SULFATE 50 % IJ SOLN
40.0000 meq | INTRAMUSCULAR | Status: DC
  Filled 2018-07-23: qty 9.85

## 2018-07-23 MED ORDER — DEXMEDETOMIDINE HCL IN NACL 400 MCG/100ML IV SOLN
0.1000 ug/kg/h | INTRAVENOUS | Status: AC
  Administered 2018-07-25: 1 ug/kg/h via INTRAVENOUS
  Filled 2018-07-23: qty 100

## 2018-07-23 MED ORDER — NITROGLYCERIN IN D5W 200-5 MCG/ML-% IV SOLN
2.0000 ug/min | INTRAVENOUS | Status: AC
  Administered 2018-07-25: 20 ug/min via INTRAVENOUS
  Filled 2018-07-23: qty 250

## 2018-07-23 MED ORDER — DOPAMINE-DEXTROSE 3.2-5 MG/ML-% IV SOLN
0.0000 ug/kg/min | INTRAVENOUS | Status: DC
  Filled 2018-07-23: qty 250

## 2018-07-25 ENCOUNTER — Inpatient Hospital Stay (HOSPITAL_COMMUNITY)
Admission: RE | Admit: 2018-07-25 | Discharge: 2018-07-26 | DRG: 266 | Disposition: A | Discharge status: Home / Self Care | Payer: Medicare Other | Source: Ambulatory Visit | Attending: Cardiovascular Disease | Admitting: Cardiovascular Disease

## 2018-07-25 ENCOUNTER — Inpatient Hospital Stay (HOSPITAL_COMMUNITY): Payer: Medicare Other | Admitting: Certified Registered"

## 2018-07-25 ENCOUNTER — Encounter (HOSPITAL_COMMUNITY): Payer: Self-pay | Admitting: *Deleted

## 2018-07-25 ENCOUNTER — Encounter (HOSPITAL_COMMUNITY)
Admission: RE | Disposition: A | Discharge status: Home / Self Care | Payer: Self-pay | Source: Ambulatory Visit | Attending: Cardiovascular Disease

## 2018-07-25 ENCOUNTER — Inpatient Hospital Stay (HOSPITAL_COMMUNITY): Payer: Medicare Other

## 2018-07-25 ENCOUNTER — Other Ambulatory Visit: Payer: Self-pay

## 2018-07-25 ENCOUNTER — Encounter (HOSPITAL_COMMUNITY): Payer: Medicare Other

## 2018-07-25 DIAGNOSIS — G4733 Obstructive sleep apnea (adult) (pediatric): Secondary | ICD-10-CM | POA: Diagnosis not present

## 2018-07-25 DIAGNOSIS — I482 Chronic atrial fibrillation, unspecified: Secondary | ICD-10-CM | POA: Diagnosis present

## 2018-07-25 DIAGNOSIS — I110 Hypertensive heart disease with heart failure: Secondary | ICD-10-CM | POA: Diagnosis not present

## 2018-07-25 DIAGNOSIS — E785 Hyperlipidemia, unspecified: Secondary | ICD-10-CM | POA: Diagnosis present

## 2018-07-25 DIAGNOSIS — Z9884 Bariatric surgery status: Secondary | ICD-10-CM

## 2018-07-25 DIAGNOSIS — I350 Nonrheumatic aortic (valve) stenosis: Secondary | ICD-10-CM | POA: Diagnosis not present

## 2018-07-25 DIAGNOSIS — I272 Pulmonary hypertension, unspecified: Secondary | ICD-10-CM | POA: Diagnosis present

## 2018-07-25 DIAGNOSIS — Z006 Encounter for examination for normal comparison and control in clinical research program: Secondary | ICD-10-CM

## 2018-07-25 DIAGNOSIS — I2510 Atherosclerotic heart disease of native coronary artery without angina pectoris: Secondary | ICD-10-CM | POA: Diagnosis not present

## 2018-07-25 DIAGNOSIS — Z952 Presence of prosthetic heart valve: Secondary | ICD-10-CM | POA: Diagnosis not present

## 2018-07-25 DIAGNOSIS — I4510 Unspecified right bundle-branch block: Secondary | ICD-10-CM | POA: Diagnosis present

## 2018-07-25 DIAGNOSIS — E119 Type 2 diabetes mellitus without complications: Secondary | ICD-10-CM | POA: Diagnosis present

## 2018-07-25 DIAGNOSIS — Z6833 Body mass index (BMI) 33.0-33.9, adult: Secondary | ICD-10-CM

## 2018-07-25 DIAGNOSIS — I361 Nonrheumatic tricuspid (valve) insufficiency: Secondary | ICD-10-CM | POA: Diagnosis not present

## 2018-07-25 DIAGNOSIS — Z8679 Personal history of other diseases of the circulatory system: Secondary | ICD-10-CM

## 2018-07-25 DIAGNOSIS — I10 Essential (primary) hypertension: Secondary | ICD-10-CM | POA: Diagnosis present

## 2018-07-25 DIAGNOSIS — R0989 Other specified symptoms and signs involving the circulatory and respiratory systems: Secondary | ICD-10-CM | POA: Diagnosis not present

## 2018-07-25 DIAGNOSIS — I5033 Acute on chronic diastolic (congestive) heart failure: Secondary | ICD-10-CM | POA: Diagnosis present

## 2018-07-25 HISTORY — PX: TRANSCATHETER AORTIC VALVE REPLACEMENT, TRANSFEMORAL: SHX6400

## 2018-07-25 HISTORY — DX: Personal history of other diseases of the circulatory system: Z86.79

## 2018-07-25 HISTORY — DX: Prediabetes: R73.03

## 2018-07-25 HISTORY — PX: TEE WITHOUT CARDIOVERSION: SHX5443

## 2018-07-25 HISTORY — DX: Presence of prosthetic heart valve: Z95.2

## 2018-07-25 LAB — POCT I-STAT 3, ART BLOOD GAS (G3+)
BICARBONATE: 25.9 mmol/L (ref 20.0–28.0)
O2 Saturation: 100 %
TCO2: 27 mmol/L (ref 22–32)
pCO2 arterial: 45.6 mmHg (ref 32.0–48.0)
pH, Arterial: 7.362 (ref 7.350–7.450)
pO2, Arterial: 333 mmHg — ABNORMAL HIGH (ref 83.0–108.0)

## 2018-07-25 LAB — POCT I-STAT, CHEM 8
BUN: 15 mg/dL (ref 8–23)
BUN: 14 mg/dL (ref 8–23)
BUN: 13 mg/dL (ref 8–23)
CREATININE: 0.8 mg/dL (ref 0.61–1.24)
CREATININE: 0.8 mg/dL (ref 0.61–1.24)
Calcium, Ion: 1.3 mmol/L (ref 1.15–1.40)
Calcium, Ion: 1.22 mmol/L (ref 1.15–1.40)
Calcium, Ion: 1.23 mmol/L (ref 1.15–1.40)
Chloride: 99 mmol/L (ref 98–111)
Chloride: 103 mmol/L (ref 98–111)
Chloride: 104 mmol/L (ref 98–111)
Creatinine, Ser: 0.8 mg/dL (ref 0.61–1.24)
GLUCOSE: 114 mg/dL — AB (ref 70–99)
Glucose, Bld: 116 mg/dL — ABNORMAL HIGH (ref 70–99)
Glucose, Bld: 122 mg/dL — ABNORMAL HIGH (ref 70–99)
HCT: 39 % (ref 39.0–52.0)
HCT: 33 % — ABNORMAL LOW (ref 39.0–52.0)
HCT: 37 % — ABNORMAL LOW (ref 39.0–52.0)
HEMOGLOBIN: 13.3 g/dL (ref 13.0–17.0)
HEMOGLOBIN: 11.2 g/dL — AB (ref 13.0–17.0)
Hemoglobin: 12.6 g/dL — ABNORMAL LOW (ref 13.0–17.0)
POTASSIUM: 3.8 mmol/L (ref 3.5–5.1)
POTASSIUM: 3.5 mmol/L (ref 3.5–5.1)
Potassium: 4 mmol/L (ref 3.5–5.1)
Sodium: 139 mmol/L (ref 135–145)
Sodium: 138 mmol/L (ref 135–145)
Sodium: 139 mmol/L (ref 135–145)
TCO2: 27 mmol/L (ref 22–32)
TCO2: 26 mmol/L (ref 22–32)
TCO2: 25 mmol/L (ref 22–32)

## 2018-07-25 SURGERY — IMPLANTATION, AORTIC VALVE, TRANSCATHETER, FEMORAL APPROACH
Anesthesia: General | Site: Chest

## 2018-07-25 MED ORDER — ROCURONIUM BROMIDE 50 MG/5ML IV SOSY
PREFILLED_SYRINGE | INTRAVENOUS | Status: AC
  Filled 2018-07-25: qty 5

## 2018-07-25 MED ORDER — LACTATED RINGERS IV SOLN
INTRAVENOUS | Status: DC | PRN
  Administered 2018-07-25: 07:00:00 via INTRAVENOUS

## 2018-07-25 MED ORDER — CHLORHEXIDINE GLUCONATE 4 % EX LIQD: 60.0000 mL | Freq: Once | CUTANEOUS | Status: DC

## 2018-07-25 MED ORDER — HEPARIN SODIUM (PORCINE) 1000 UNIT/ML IJ SOLN
INTRAMUSCULAR | Status: AC
  Filled 2018-07-25: qty 1

## 2018-07-25 MED ORDER — NITROGLYCERIN IN D5W 200-5 MCG/ML-% IV SOLN: 0.0000 ug/min | INTRAVENOUS | Status: DC

## 2018-07-25 MED ORDER — CHLORHEXIDINE GLUCONATE 0.12 % MT SOLN
15.0000 mL | Freq: Once | OROMUCOSAL | Status: AC
  Administered 2018-07-25: 15 mL via OROMUCOSAL
  Filled 2018-07-25: qty 15

## 2018-07-25 MED ORDER — ACETAMINOPHEN 650 MG RE SUPP: 650.0000 mg | Freq: Four times a day (QID) | RECTAL | Status: DC | PRN

## 2018-07-25 MED ORDER — PROTAMINE SULFATE 10 MG/ML IV SOLN
INTRAVENOUS | Status: DC | PRN
  Administered 2018-07-25: 170 mg via INTRAVENOUS

## 2018-07-25 MED ORDER — SODIUM CHLORIDE 0.9 % IV SOLN
1.5000 g | Freq: Two times a day (BID) | INTRAVENOUS | Status: DC
  Administered 2018-07-25 – 2018-07-26 (×2): 1.5 g via INTRAVENOUS
  Filled 2018-07-25 (×4): qty 1.5

## 2018-07-25 MED ORDER — PROPOFOL 10 MG/ML IV BOLUS
INTRAVENOUS | Status: AC
  Filled 2018-07-25: qty 20

## 2018-07-25 MED ORDER — OXYCODONE HCL 5 MG PO TABS: 5.0000 mg | ORAL_TABLET | ORAL | Status: DC | PRN

## 2018-07-25 MED ORDER — SODIUM CHLORIDE 0.9% FLUSH: 3.0000 mL | INTRAVENOUS | Status: DC | PRN

## 2018-07-25 MED ORDER — FERROUS SULFATE 325 (65 FE) MG PO TABS: 325.0000 mg | ORAL_TABLET | Freq: Every day | ORAL | Status: DC

## 2018-07-25 MED ORDER — LIDOCAINE HCL (PF) 1 % IJ SOLN
INTRAMUSCULAR | Status: AC
  Filled 2018-07-25: qty 30

## 2018-07-25 MED ORDER — 0.9 % SODIUM CHLORIDE (POUR BTL) OPTIME
TOPICAL | Status: DC | PRN
  Administered 2018-07-25: 1000 mL

## 2018-07-25 MED ORDER — ASPIRIN 81 MG PO CHEW
81.0000 mg | CHEWABLE_TABLET | Freq: Every day | ORAL | Status: DC
  Administered 2018-07-26: 81 mg via ORAL
  Filled 2018-07-25 (×2): qty 1

## 2018-07-25 MED ORDER — FENTANYL CITRATE (PF) 250 MCG/5ML IJ SOLN
INTRAMUSCULAR | Status: AC
  Filled 2018-07-25: qty 5

## 2018-07-25 MED ORDER — TRAMADOL HCL 50 MG PO TABS: 50.0000 mg | ORAL_TABLET | ORAL | Status: DC | PRN

## 2018-07-25 MED ORDER — SODIUM CHLORIDE 0.9 % IV SOLN
INTRAVENOUS | Status: DC | PRN
  Administered 2018-07-25: 1500 mL

## 2018-07-25 MED ORDER — METOPROLOL TARTRATE 5 MG/5ML IV SOLN: 2.5000 mg | INTRAVENOUS | Status: DC | PRN

## 2018-07-25 MED ORDER — ONDANSETRON HCL 4 MG/2ML IJ SOLN
INTRAMUSCULAR | Status: DC | PRN
  Administered 2018-07-25: 4 mg via INTRAVENOUS

## 2018-07-25 MED ORDER — ONDANSETRON HCL 4 MG/2ML IJ SOLN
INTRAMUSCULAR | Status: AC
  Filled 2018-07-25: qty 2

## 2018-07-25 MED ORDER — MIDAZOLAM HCL 2 MG/2ML IJ SOLN
INTRAMUSCULAR | Status: AC
  Filled 2018-07-25: qty 2

## 2018-07-25 MED ORDER — ESMOLOL HCL 100 MG/10ML IV SOLN
INTRAVENOUS | Status: DC | PRN
  Administered 2018-07-25: 20 mg via INTRAVENOUS

## 2018-07-25 MED ORDER — MIDAZOLAM HCL 2 MG/2ML IJ SOLN
INTRAMUSCULAR | Status: DC | PRN
  Administered 2018-07-25 (×2): 1 mg via INTRAVENOUS

## 2018-07-25 MED ORDER — SODIUM CHLORIDE 0.9 % IV SOLN: 250.0000 mL | INTRAVENOUS | Status: DC | PRN

## 2018-07-25 MED ORDER — FENTANYL CITRATE (PF) 250 MCG/5ML IJ SOLN
INTRAMUSCULAR | Status: DC | PRN
  Administered 2018-07-25: 50 ug via INTRAVENOUS
  Administered 2018-07-25: 150 ug via INTRAVENOUS
  Administered 2018-07-25: 50 ug via INTRAVENOUS

## 2018-07-25 MED ORDER — DEXAMETHASONE SODIUM PHOSPHATE 10 MG/ML IJ SOLN
INTRAMUSCULAR | Status: DC | PRN
  Administered 2018-07-25: 5 mg via INTRAVENOUS

## 2018-07-25 MED ORDER — LIDOCAINE HCL (PF) 1 % IJ SOLN: INTRAMUSCULAR | Status: DC | PRN

## 2018-07-25 MED ORDER — SODIUM CHLORIDE 0.9 % IV SOLN
INTRAVENOUS | Status: AC
  Filled 2018-07-25 (×3): qty 1.2

## 2018-07-25 MED ORDER — HEPARIN SODIUM (PORCINE) 1000 UNIT/ML IJ SOLN
INTRAMUSCULAR | Status: DC | PRN
  Administered 2018-07-25: 17000 [IU] via INTRAVENOUS

## 2018-07-25 MED ORDER — MORPHINE SULFATE (PF) 2 MG/ML IV SOLN: 2.0000 mg | INTRAVENOUS | Status: DC | PRN

## 2018-07-25 MED ORDER — ROCURONIUM BROMIDE 10 MG/ML (PF) SYRINGE
PREFILLED_SYRINGE | INTRAVENOUS | Status: DC | PRN
  Administered 2018-07-25: 50 mg via INTRAVENOUS

## 2018-07-25 MED ORDER — ATORVASTATIN CALCIUM 80 MG PO TABS
80.0000 mg | ORAL_TABLET | Freq: Every day | ORAL | Status: DC
  Administered 2018-07-25: 80 mg via ORAL
  Filled 2018-07-25: qty 1

## 2018-07-25 MED ORDER — FUROSEMIDE 10 MG/ML IJ SOLN
40.0000 mg | Freq: Once | INTRAMUSCULAR | Status: AC
  Administered 2018-07-25: 40 mg via INTRAVENOUS
  Filled 2018-07-25: qty 4

## 2018-07-25 MED ORDER — IODIXANOL 320 MG/ML IV SOLN
INTRAVENOUS | Status: DC | PRN
  Administered 2018-07-25: 60.6 mL via INTRAVENOUS

## 2018-07-25 MED ORDER — PROPOFOL 10 MG/ML IV BOLUS
INTRAVENOUS | Status: DC | PRN
  Administered 2018-07-25: 150 mg via INTRAVENOUS
  Administered 2018-07-25: 50 mg via INTRAVENOUS

## 2018-07-25 MED ORDER — LIDOCAINE 2% (20 MG/ML) 5 ML SYRINGE
INTRAMUSCULAR | Status: AC
  Filled 2018-07-25: qty 5

## 2018-07-25 MED ORDER — FERROUS SULFATE 325 (65 FE) MG PO TABS
325.0000 mg | ORAL_TABLET | Freq: Every day | ORAL | Status: DC
  Administered 2018-07-26: 325 mg via ORAL
  Filled 2018-07-25: qty 1

## 2018-07-25 MED ORDER — PROTAMINE SULFATE 10 MG/ML IV SOLN
INTRAVENOUS | Status: AC
  Filled 2018-07-25: qty 25

## 2018-07-25 MED ORDER — ACETAMINOPHEN 325 MG PO TABS: 650.0000 mg | ORAL_TABLET | Freq: Four times a day (QID) | ORAL | Status: DC | PRN

## 2018-07-25 MED ORDER — SODIUM CHLORIDE 0.9 % IV SOLN: INTRAVENOUS | Status: DC

## 2018-07-25 MED ORDER — VANCOMYCIN HCL IN DEXTROSE 1-5 GM/200ML-% IV SOLN
1000.0000 mg | Freq: Once | INTRAVENOUS | Status: AC
  Administered 2018-07-25: 1000 mg via INTRAVENOUS
  Filled 2018-07-25: qty 200

## 2018-07-25 MED ORDER — DEXAMETHASONE SODIUM PHOSPHATE 10 MG/ML IJ SOLN
INTRAMUSCULAR | Status: AC
  Filled 2018-07-25: qty 1

## 2018-07-25 MED ORDER — CHLORHEXIDINE GLUCONATE 4 % EX LIQD: 30.0000 mL | CUTANEOUS | Status: DC

## 2018-07-25 MED ORDER — ONDANSETRON HCL 4 MG/2ML IJ SOLN: 4.0000 mg | Freq: Four times a day (QID) | INTRAMUSCULAR | Status: DC | PRN

## 2018-07-25 MED ORDER — SODIUM CHLORIDE 0.9 % IV SOLN
INTRAVENOUS | Status: DC
  Administered 2018-07-25: 12:00:00 via INTRAVENOUS
  Administered 2018-07-25: 50 mL/h via INTRAVENOUS

## 2018-07-25 MED ORDER — SODIUM CHLORIDE 0.9% FLUSH
3.0000 mL | Freq: Two times a day (BID) | INTRAVENOUS | Status: DC
  Administered 2018-07-25 – 2018-07-26 (×2): 3 mL via INTRAVENOUS

## 2018-07-25 SURGICAL SUPPLY — 86 items
ADH SKN CLS APL DERMABOND .7 (GAUZE/BANDAGES/DRESSINGS) ×2
BAG DECANTER FOR FLEXI CONT (MISCELLANEOUS) ×4 IMPLANT
BAG SNAP BAND KOVER 36X36 (MISCELLANEOUS) ×4 IMPLANT
BLADE CLIPPER SURG (BLADE) ×4 IMPLANT
BLADE OSCILLATING /SAGITTAL (BLADE) IMPLANT
BLADE STERNUM SYSTEM 6 (BLADE) IMPLANT
CABLE ADAPT CONN TEMP 6FT (ADAPTER) ×4 IMPLANT
CANNULA FEM VENOUS REMOTE 22FR (CANNULA) IMPLANT
CANNULA OPTISITE PERFUSION 16F (CANNULA) IMPLANT
CANNULA OPTISITE PERFUSION 18F (CANNULA) IMPLANT
CATH DIAG EXPO 6F VENT PIG 145 (CATHETERS) ×8 IMPLANT
CATH EXPO 5FR AL1 (CATHETERS) IMPLANT
CATH INFINITI 6F AL2 (CATHETERS) IMPLANT
CATH S G BIP PACING (SET/KITS/TRAYS/PACK) ×4 IMPLANT
CLIP VESOCCLUDE MED 24/CT (CLIP) IMPLANT
CLIP VESOCCLUDE SM WIDE 24/CT (CLIP) IMPLANT
CONT SPEC 4OZ CLIKSEAL STRL BL (MISCELLANEOUS) ×12 IMPLANT
COVER BACK TABLE 24X17X13 BIG (DRAPES) IMPLANT
COVER BACK TABLE 80X110 HD (DRAPES) ×4 IMPLANT
COVER DOME SNAP 22 D (MISCELLANEOUS) IMPLANT
CRADLE DONUT ADULT HEAD (MISCELLANEOUS) ×4 IMPLANT
DERMABOND ADVANCED (GAUZE/BANDAGES/DRESSINGS) ×2
DERMABOND ADVANCED .7 DNX12 (GAUZE/BANDAGES/DRESSINGS) ×2 IMPLANT
DEVICE CLOSURE PERCLS PRGLD 6F (VASCULAR PRODUCTS) ×6 IMPLANT
DRAPE INCISE IOBAN 66X45 STRL (DRAPES) IMPLANT
DRSG TEGADERM 4X4.75 (GAUZE/BANDAGES/DRESSINGS) ×8 IMPLANT
ELECT CAUTERY BLADE 6.4 (BLADE) IMPLANT
ELECT REM PT RETURN 9FT ADLT (ELECTROSURGICAL) ×8
ELECTRODE REM PT RTRN 9FT ADLT (ELECTROSURGICAL) ×4 IMPLANT
FELT TEFLON 6X6 (MISCELLANEOUS) IMPLANT
FEMORAL VENOUS CANN RAP (CANNULA) IMPLANT
GAUZE SPONGE 4X4 12PLY STRL (GAUZE/BANDAGES/DRESSINGS) ×4 IMPLANT
GLOVE BIO SURGEON STRL SZ7.5 (GLOVE) ×4 IMPLANT
GLOVE BIO SURGEON STRL SZ8 (GLOVE) IMPLANT
GLOVE EUDERMIC 7 POWDERFREE (GLOVE) ×4 IMPLANT
GLOVE ORTHO TXT STRL SZ7.5 (GLOVE) IMPLANT
GOWN STRL REUS W/ TWL LRG LVL3 (GOWN DISPOSABLE) ×6 IMPLANT
GOWN STRL REUS W/ TWL XL LVL3 (GOWN DISPOSABLE) ×6 IMPLANT
GOWN STRL REUS W/TWL LRG LVL3 (GOWN DISPOSABLE) ×12
GOWN STRL REUS W/TWL XL LVL3 (GOWN DISPOSABLE) ×12
GUIDEWIRE SAFE TJ AMPLATZ EXST (WIRE) ×4 IMPLANT
GUIDEWIRE STRAIGHT .035 260CM (WIRE) ×4 IMPLANT
INSERT FOGARTY SM (MISCELLANEOUS) IMPLANT
KIT BASIN OR (CUSTOM PROCEDURE TRAY) ×4 IMPLANT
KIT DILATOR VASC 18G NDL (KITS) IMPLANT
KIT HEART LEFT (KITS) ×4 IMPLANT
KIT SUCTION CATH 14FR (SUCTIONS) IMPLANT
KIT TURNOVER KIT B (KITS) ×4 IMPLANT
LOOP VESSEL MAXI BLUE (MISCELLANEOUS) IMPLANT
LOOP VESSEL MINI RED (MISCELLANEOUS) IMPLANT
NEEDLE 22X1 1/2 (OR ONLY) (NEEDLE) IMPLANT
NEEDLE PERC 18GX7CM (NEEDLE) ×4 IMPLANT
NS IRRIG 1000ML POUR BTL (IV SOLUTION) ×4 IMPLANT
PACK ENDOVASCULAR (PACKS) ×4 IMPLANT
PAD ARMBOARD 7.5X6 YLW CONV (MISCELLANEOUS) ×8 IMPLANT
PAD ELECT DEFIB RADIOL ZOLL (MISCELLANEOUS) ×4 IMPLANT
PENCIL BUTTON HOLSTER BLD 10FT (ELECTRODE) ×4 IMPLANT
PERCLOSE PROGLIDE 6F (VASCULAR PRODUCTS) ×12
SET MICROPUNCTURE 5F STIFF (MISCELLANEOUS) ×4 IMPLANT
SHEATH BRITE TIP 6FR 35CM (SHEATH) ×4 IMPLANT
SHEATH PINNACLE 6F 10CM (SHEATH) ×4 IMPLANT
SHEATH PINNACLE 8F 10CM (SHEATH) ×4 IMPLANT
SLEEVE REPOSITIONING LENGTH 30 (MISCELLANEOUS) ×4 IMPLANT
SPONGE LAP 4X18 RFD (DISPOSABLE) IMPLANT
STOPCOCK MORSE 400PSI 3WAY (MISCELLANEOUS) ×8 IMPLANT
SUT ETHIBOND X763 2 0 SH 1 (SUTURE) IMPLANT
SUT GORETEX CV 4 TH 22 36 (SUTURE) IMPLANT
SUT GORETEX CV4 TH-18 (SUTURE) IMPLANT
SUT MNCRL AB 3-0 PS2 18 (SUTURE) IMPLANT
SUT PROLENE 5 0 C 1 36 (SUTURE) IMPLANT
SUT PROLENE 6 0 C 1 30 (SUTURE) IMPLANT
SUT SILK  1 MH (SUTURE) ×2
SUT SILK 1 MH (SUTURE) ×2 IMPLANT
SUT VIC AB 2-0 CT1 27 (SUTURE)
SUT VIC AB 2-0 CT1 TAPERPNT 27 (SUTURE) IMPLANT
SUT VIC AB 2-0 CTX 36 (SUTURE) IMPLANT
SUT VIC AB 3-0 SH 8-18 (SUTURE) IMPLANT
SYR 50ML LL SCALE MARK (SYRINGE) ×4 IMPLANT
SYR BULB IRRIGATION 50ML (SYRINGE) IMPLANT
SYR CONTROL 10ML LL (SYRINGE) IMPLANT
TAPE CLOTH SURG 4X10 WHT LF (GAUZE/BANDAGES/DRESSINGS) ×4 IMPLANT
TOWEL GREEN STERILE (TOWEL DISPOSABLE) ×8 IMPLANT
TRANSDUCER W/STOPCOCK (MISCELLANEOUS) ×8 IMPLANT
TRAY FOLEY SLVR 16FR TEMP STAT (SET/KITS/TRAYS/PACK) IMPLANT
VALVE HEART TRANSCATH SZ3 26MM (Prosthesis & Implant Heart) ×4 IMPLANT
WIRE .035 3MM-J 145CM (WIRE) ×4 IMPLANT

## 2018-07-25 NOTE — Progress Notes (Signed)
Pt ambulated in hallway 200 feet with walker. Tolerated well. Pt returned to recliner. VSS. Call light in reach. Will continue to monitor.  Versie Starks, RN

## 2018-07-25 NOTE — Progress Notes (Signed)
  Echocardiogram Echocardiogram Transesophageal has been performed.  Leta Jungling M 07/25/2018, 9:38 AM

## 2018-07-25 NOTE — Progress Notes (Signed)
Pt received from cath lab. VSS. Bilateral groin sites level 0. CHG bath complete. Telemetry applied. Pt and family oriented to room and unit. Pt educated on bedrest until 1430. Lunch tray ordered. Will continue to monitor.  Versie Starks, RN

## 2018-07-25 NOTE — Anesthesia Procedure Notes (Signed)
Central Venous Catheter Insertion Performed by: Achille Rich, MD, anesthesiologist Start/End9/01/2018 7:01 AM, 07/25/2018 7:09 AM Patient location: Pre-op. Preanesthetic checklist: patient identified, IV checked, site marked, risks and benefits discussed, surgical consent, monitors and equipment checked, pre-op evaluation, timeout performed and anesthesia consent Position: Trendelenburg Lidocaine 1% used for infiltration and patient sedated Hand hygiene performed , maximum sterile barriers used  and Seldinger technique used Catheter size: 7 Fr Central line was placed.Double lumen Procedure performed using ultrasound guided technique. Ultrasound Notes:anatomy identified, needle tip was noted to be adjacent to the nerve/plexus identified and no ultrasound evidence of intravascular and/or intraneural injection Attempts: 1 Following insertion, line sutured, dressing applied and Biopatch. Post procedure assessment: blood return through all ports, free fluid flow and no air  Patient tolerated the procedure well with no immediate complications.

## 2018-07-25 NOTE — Anesthesia Postprocedure Evaluation (Signed)
Anesthesia Post Note  Patient: Joel Rogers  Procedure(s) Performed: TRANSCATHETER AORTIC VALVE REPLACEMENT, TRANSFEMORAL (N/A Chest) TRANSESOPHAGEAL ECHOCARDIOGRAM (TEE) (N/A )     Patient location during evaluation: PACU Anesthesia Type: General Level of consciousness: awake and alert Pain management: pain level controlled Vital Signs Assessment: post-procedure vital signs reviewed and stable Respiratory status: spontaneous breathing, nonlabored ventilation, respiratory function stable and patient connected to nasal cannula oxygen Cardiovascular status: blood pressure returned to baseline and stable Postop Assessment: no apparent nausea or vomiting Anesthetic complications: no    Last Vitals:  Vitals:   07/25/18 1500 07/25/18 1548  BP: 136/79 117/76  Pulse: 60   Resp: 17 13  Temp:    SpO2: 100%     Last Pain:  Vitals:   07/25/18 1201  TempSrc:   PainSc: 0-No pain                 Sadae Arrazola S

## 2018-07-25 NOTE — Anesthesia Procedure Notes (Signed)
Arterial Line Insertion Start/End9/01/2018 7:50 AM, 07/25/2018 8:00 AM Performed by: Shireen Quan, CRNA, CRNA  Patient location: Pre-op. Preanesthetic checklist: patient identified, IV checked, site marked, risks and benefits discussed, surgical consent, monitors and equipment checked, pre-op evaluation, timeout performed and anesthesia consent Lidocaine 1% used for infiltration and patient sedated Right, radial was placed Catheter size: 20 G Hand hygiene performed , maximum sterile barriers used  and Seldinger technique used  Attempts: 1 Procedure performed without using ultrasound guided technique. Following insertion, dressing applied and Biopatch. Post procedure assessment: normal  Patient tolerated the procedure well with no immediate complications.

## 2018-07-25 NOTE — Progress Notes (Signed)
CCMD informed RN that pt HR in 30's. Pt asleep in chair, easily aroused. HR sustaining in 30-40's while pt is sleepy. Carlean Jews PA paged. Will continue to monitor.  Versie Starks, RN

## 2018-07-25 NOTE — H&P (Signed)
[address]       [place] [address]             [phone]      Cardiothoracic Surgery Admission History and Physical   Referring Provider is Glori Bickers, MD  PCP is Orlena Sheldon, PA-C  Reason for Admission: Severe symptomatic aortic stenosis for TAVR  HPI:  The patient is a 67 year old gentleman with a history of hypertension, hyperlipidemia, diabetes, chronic atrial fibrillation, morbid obesity status post gastric bypass surgery, sleep apnea on CPAP, pulmonary hypertension, chronic diastolic congestive heart failure, and severe aortic stenosis he was referred for consideration of transcatheter aortic valve replacement. He has been followed for aortic stenosis for several years. An echocardiogram on 02/09/2018 showed severe aortic stenosis with a mean gradient of 40 mmHg and a peak gradient of 76 mmHg. The dimensionless index was 0.28. Left ventricular ejection fraction was 60 to 65% with grade 3 diastolic dysfunction. There was no mitral stenosis or regurgitation. He was seen by Dr. Angelena Form in clinic for consideration of transcatheter aortic valve replacement and was scheduled for right and left heart catheterization on 07/06/2018. Catheterization showed severe elevation of his right heart pressures with a mean right atrial pressure of 26 mmHg and RV end-diastolic pressure of 24 mmHg. Due to the size of his atrium was not possible to pass the pulmonary artery catheter out into the pulmonary artery. The case was aborted after the right heart cath due to respiratory distress during the procedure. With his severe pulmonary hypertension documented by previous echocardiogram he was admitted to the advanced heart failure team for optimization and diuresis. The patient reports worsening dyspnea on exertion and lower extremity edema prior to admission. He has not had any chest discomfort. Denies dizziness and syncope.  A repeat echocardiogram on 07/07/2018 showed progression  of his aortic stenosis with a rise in the mean gradient to 57 mmHg and a peak gradient of 100 mmHg. The right ventricle was mildly dilated. Left ventricular ejection fraction was 65 to 70%. He subsequently underwent repeat right and left heart cath on 07/10/2018. This showed normal coronary arteries. There is severe pulmonary hypertension with a PA pressure of 91/34 and a wedge pressure of 28. Cardiac index was 4.4. Pulmonary vascular resistance was 2.5 Wood units. PA sat was 79% with a peripheral side of 99%. Right atrial pressure was 17. The LVEDP was 30. Optimization with diuresis was continued by the heart failure team. Due to bad teeth he was evaluated by dental medicine and underwent extraction of multiple teeth. Following that he was discharged with plans for TAVR in the near future.      Past Medical History:  Diagnosis Date  . Arthritis   . Atrial fibrillation, chronic ([facility])   . Chronic diastolic CHF (congestive heart failure) ([facility]) 06/15/2018  . Diabetes mellitus without complication (HCC)    borderline  . Hyperlipidemia   . Hypertension   . Morbid obesity ([facility])   . Normal coronary arteries    by cardiac catheterization performed 03/14/06  . Obesity   . Sleep apnea    on CPAP  . Venous insufficiency         Past Surgical History:  Procedure Laterality Date  . CRANIOTOMY Right 08/27/2016   Procedure: CRANIOTOMY HEMATOMA EVACUATION SUBDURAL; Surgeon: Erline Levine, MD; Location: [facility]; Service: Neurosurgery; Laterality: Right;  . GASTRIC BYPASS  09/2008  . JOINT REPLACEMENT  08/31/2006   right hip  . MULTIPLE EXTRACTIONS  WITH ALVEOLOPLASTY N/A 07/13/2018   Procedure: Extraction of tooth #'s 3,8,10, 23-26, 30and 32 with alveoloplasty and gross debridement of remaining teeth; Surgeon: Lenn Cal, DDS; Location: [facility]; Service: Oral Surgery; Laterality: N/A;  . RIGHT HEART CATH N/A 07/06/2018   Procedure: RIGHT HEART CATH; Surgeon: Burnell Blanks, MD; Location: [facility]; Service: Cardiovascular; Laterality: N/A;  . RIGHT/LEFT HEART CATH AND CORONARY ANGIOGRAPHY N/A 07/10/2018   Procedure: RIGHT/LEFT HEART CATH AND CORONARY ANGIOGRAPHY; Surgeon: Jolaine Artist, MD; Location: [facility]; Service: Cardiovascular; Laterality: N/A;  . TOTAL HIP ARTHROPLASTY     right hip        Family History  Problem Relation Age of Onset  . Hypertension Father   . Cancer Father    prob prostate  . Alzheimer's disease Mother   . Alzheimer's disease Maternal Grandfather   . Breast cancer Sister   . Cancer Brother    "brain tumor"   Social History        Socioeconomic History  . Marital status: Married    Spouse name: [name]  . Number of children: 2  . Years of education: 46  . Highest education level: Not on file  Occupational History  . 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 Financial resource strain: Not on file  . Food insecurity:    Worry: Not on file    Inability: Not on file  . Transportation needs:    Medical: Not on file    Non-medical: Not on file  Tobacco Use  . Smoking status: Never Smoker  . Smokeless tobacco: Never Used  Substance and Sexual Activity  . Alcohol use: No  . Drug use: No  . Sexual activity: Not Currently  Lifestyle  . Physical activity:    Days per week: Not on file    Minutes per session: Not on file  . Stress: Not on file  Relationships  . Social connections:    Talks on phone: Not on file    Gets together: Not on file    Attends religious service: Not on file    Active member of club or organization: Not on file    Attends meetings of clubs or organizations: Not on file    Relationship status: Not on file  . Intimate partner violence:    Fear of current or ex partner: Not on file    Emotionally abused: Not on file    Physically abused: Not on file    Forced sexual activity: Not on file  Other Topics Concern  . Not on file    Social History Narrative   Doctorate in ministry   Retired Therapist, occupational   Disability from hip replacement   Lives with wife [name]   Exercises at the [employer] - water exercise            Current Facility-Administered Medications  Medication Dose Route Frequency Provider Last Rate Last Dose  . 0.9 % sodium chloride infusion 250 mL Intravenous PRN Teena Dunk F, DDS    . 0.9 % sodium chloride infusion 250 mL Intravenous PRN Teena Dunk F, DDS    . 0.9 % sodium chloride infusion 250 mL Intravenous PRN Bensimhon, Shaune Pascal, MD    . acetaminophen (TYLENOL) tablet 650 mg 650 mg Oral Q4H PRN Teena Dunk F, DDS    . aminocaproic acid (AMICAR) oral solution 50 mg/mL (5%), 100 ml 10 mL Oral Q1H  Lenn Cal, DDS  10 mL at 07/14/18 1120  . atorvastatin (LIPITOR) tablet 80 mg 80 mg Oral q1800 Lenn Cal, DDS  80 mg at 07/13/18 1750  . diltiazem (CARDIZEM CD) 24 hr capsule 120 mg 120 mg Oral Daily Teena Dunk F, DDS  120 mg at 07/14/18 0930  . enoxaparin (LOVENOX) injection 60 mg 60 mg Subcutaneous Q24H Lenn Cal, DDS  60 mg at 07/13/18 2148  . feeding supplement (BOOST / RESOURCE BREEZE) liquid 1 Container 1 Container Oral BID BM Bensimhon, Shaune Pascal, MD    . lactated ringers infusion  Intravenous Continuous Lenn Cal, DDS 10 mL/hr at 07/14/18 1000   . losartan (COZAAR) tablet 25 mg 25 mg Oral Daily Teena Dunk F, DDS  25 mg at 07/14/18 0930  . morphine 2 MG/ML injection 2-4 mg 2-4 mg Intravenous Q1H PRN Georgiana Shore, NP  4 mg at 07/14/18 1353  . ondansetron (ZOFRAN) injection 4 mg 4 mg Intravenous Q6H PRN Lenn Cal, DDS    . oxyCODONE-acetaminophen (PERCOCET/ROXICET) 5-325 MG per tablet 1-2 tablet 1-2 tablet Oral Q6H PRN Lenn Cal, DDS  2 tablet at 07/14/18 [phone]  . sodium chloride flush (NS) 0.9 % injection 3 mL 3 mL Intravenous Q12H Teena Dunk F, DDS  3 mL at 07/13/18 2149  . sodium chloride flush (NS) 0.9 %  injection 3 mL 3 mL Intravenous PRN Teena Dunk F, DDS    . sodium chloride flush (NS) 0.9 % injection 3 mL 3 mL Intravenous Q12H Teena Dunk F, DDS  3 mL at 07/13/18 2149  . sodium chloride flush (NS) 0.9 % injection 3 mL 3 mL Intravenous PRN Teena Dunk F, DDS  3 mL at 07/12/18 1117  . sodium chloride flush (NS) 0.9 % injection 3 mL 3 mL Intravenous Q12H Bensimhon, Shaune Pascal, MD  3 mL at 07/13/18 2148  . sodium chloride flush (NS) 0.9 % injection 3 mL 3 mL Intravenous PRN Bensimhon, Shaune Pascal, MD    . spironolactone (ALDACTONE) tablet 25 mg 25 mg Oral Daily Teena Dunk F, DDS  25 mg at 07/14/18 0930   No Known Allergies  Review of Systems:  General: normal appetite, decreased energy, no weight gain, no weight loss, no fever  Cardiac: no chest pain with exertion, no chest pain at rest, +SOB with exertion, no resting SOB, no PND, no orthopnea, no palpitations, + arrhythmia, + atrial fibrillation, + LE edema, no dizzy spells, no syncope  Respiratory: exertional shortness of breath, no home oxygen, no productive cough, no dry cough, no bronchitis, no wheezing, no hemoptysis, no asthma, no pain with inspiration or cough, + sleep apnea, + CPAP at night  GI: no difficulty swallowing, no reflux, no frequent heartburn, no hiatal hernia, no abdominal pain, no constipation, no diarrhea, no hematochezia, no hematemesis, no melena  GU: no dysuria, no frequency, no urinary tract infection, no hematuria, no enlarged prostate, no kidney stones, no kidney disease  Vascular: no pain suggestive of claudication, no pain in feet, no leg cramps, no varicose veins, no DVT, no non-healing foot ulcer  Neuro: no stroke, no TIA's, no seizures, no headaches, no temporary blindness one eye, no slurred speech, no peripheral neuropathy, no chronic pain, no instability of gait, no memory/cognitive dysfunction  Musculoskeletal: no arthritis, no joint swelling, no myalgias, no difficulty walking, normal mobility    Skin: no rash, no itching, no skin infections, no pressure sores or ulcerations  Psych: no anxiety, no depression,  no nervousness, no unusual recent stress  Eyes: no blurry vision, no floaters, no recent vision changes, does not wear glasses or contacts  ENT: no hearing loss, no loose or painful teeth, no dentures, Had extractions today  Hematologic: no easy bruising, no abnormal bleeding, no clotting disorder, no frequent epistaxis  Endocrine: + diabetes, does not check CBG's at home  Physical Exam:  BP 130/84 (BP Location: Left Arm)  Pulse 72  Temp 98.3 F (36.8 C) (Oral)  Resp 17  Ht 6' (1.829 m)  Wt 112 kg  SpO2 92%  BMI 33.49 kg/m  General: Obese, looks uncomfortable after extractions.  HEENT: Unremarkable, NCAT, PERLA, mouth packed with gauze.  Neck: no JVD, no bruits, no adenopathy or thyromegaly  Chest: clear to auscultation, symmetrical breath sounds, no wheezes, no rhonchi  CV: RRR, grade III/VI crescendo/decrescendo murmur heard best at RSB, no diastolic murmur  Abdomen: soft, non-tender, no masses or organomegaly, large panniculus  Extremities: warm, well-perfused, pulses palpable in feet, mild LE edema  Rectal/GU Deferred  Neuro: Grossly non-focal and symmetrical throughout  Skin: Clean and dry, no rashes, no breakdown  Diagnostic Tests:  Result status: Final result  *[place]*  *[facility]*  [address], [place] [address]  [phone]  -------------------------------------------------------------------  Transthoracic Echocardiography  Patient: Joel Rogers, Joel Rogers  MR #: [id number]  Study Date: 07/07/2018  Gender: M  Age: 49  Height: 182.9 cm  Weight: 124.5 kg  BSA: 2.56 m^2  Pt. Status:  Room: 2C08C  ATTENDING Sonny Dandy, Christopher  REFERRING [place], Christopher  ADMITTING Bensimhon, Daniel  PERFORMING [facility], Inpatient  SONOGRAPHER [facility]  cc:   -------------------------------------------------------------------  LV EF: 65% - 70%  -------------------------------------------------------------------  Indications: Aortic stenosis 424.1.  -------------------------------------------------------------------  History: PMH: Pulmonary hypertension Acute kidney injury  Syncope. Congestive heart failure. Risk factors: Hypertension.  Dyslipidemia.  -------------------------------------------------------------------  Study Conclusions  - Left ventricle: The cavity size was normal. Wall thickness was  increased in a pattern of moderate LVH. Systolic function was  vigorous. The estimated ejection fraction was in the range of 65%  to 70%. Wall motion was normal; there were no regional wall  motion abnormalities. The study was not technically sufficient to  allow evaluation of LV diastolic dysfunction due to atrial  fibrillation.  - Aortic valve: Trileaflet; severely calcified leaflets. There was  severe stenosis. There was trivial regurgitation. Mean gradient  (S): 57 mm Hg. Peak gradient (S): 100 mm Hg. Valve area (VTI):  0.97 cm^2.  - Mitral valve: Mildly calcified annulus. There was trivial  regurgitation.  - Left atrium: The atrium was severely dilated.  - Right ventricle: The cavity size was mildly dilated. Systolic  function was normal.  - Right atrium: The atrium was severely dilated.  - Tricuspid valve: Peak RV-RA gradient (S): 51 mm Hg.  - Pulmonary arteries: PA peak pressure: 66 mm Hg (S).  - Systemic veins: IVC measured 3.4 cm with < 50% respirophasic  variation, suggesting RA pressure 15 mmHg.  Impressions:  - The patient was in atrial fibrillation. Normal LV size with  moderate LV hypertrophy. EF 65-70%. Mildly dilated RV with normal  systolic function. Severe aortic stenosis. Severe biatrial  enlargement. Moderate pulmonary hypertension. Dilated IVC  suggestive of elevated RV filling pressure.   -------------------------------------------------------------------  Study data: Comparison was made to the study of 02/09/2018. Study  status: Routine. Procedure: The patient reported no pain pre or  post test. Transthoracic echocardiography. Image quality  was  adequate. Study completion: There were no complications.  Transthoracic echocardiography. M-mode, complete 2D, spectral  Doppler, and color Doppler. Birthdate: Patient birthdate:  1951/08/01. Age: Patient is 67 yr old. Sex: Gender: male.  BMI: 37.2 kg/m^2. Blood pressure: 127/75 Patient status:  Inpatient. Study date: Study date: 07/07/2018. Study time: 09:29  AM. Location: ICU/CCU  -------------------------------------------------------------------  -------------------------------------------------------------------  Left ventricle: The cavity size was normal. Wall thickness was  increased in a pattern of moderate LVH. Systolic function was  vigorous. The estimated ejection fraction was in the range of 65%  to 70%. Wall motion was normal; there were no regional wall motion  abnormalities. The study was not technically sufficient to allow  evaluation of LV diastolic dysfunction due to atrial fibrillation.  -------------------------------------------------------------------  Aortic valve: Trileaflet; severely calcified leaflets. Doppler:  There was severe stenosis. There was trivial regurgitation.  VTI ratio of LVOT to aortic valve: 0.34. Valve area (VTI): 0.97  cm^2. Indexed valve area (VTI): 0.38 cm^2/m^2. Peak velocity ratio  of LVOT to aortic valve: 0.39. Valve area (Vmax): 1.1 cm^2. Indexed  valve area (Vmax): 0.43 cm^2/m^2. Mean velocity ratio of LVOT to  aortic valve: 0.38. Valve area (Vmean): 1.09 cm^2. Indexed valve  area (Vmean): 0.43 cm^2/m^2. Mean gradient (S): 57 mm Hg. Peak  gradient (S): 100 mm Hg.  -------------------------------------------------------------------  Aorta: Aortic root: The aortic root was normal  in size.  Ascending aorta: The ascending aorta was normal in size.  -------------------------------------------------------------------  Mitral valve: Mildly calcified annulus. Doppler: There was no  evidence for stenosis. There was trivial regurgitation. Valve  area by pressure half-time: 4.58 cm^2. Indexed valve area by  pressure half-time: 1.79 cm^2/m^2. Peak gradient (D): 12 mm Hg.  -------------------------------------------------------------------  Left atrium: The atrium was severely dilated.  -------------------------------------------------------------------  Right ventricle: The cavity size was mildly dilated. Systolic  function was normal.  -------------------------------------------------------------------  Pulmonic valve: Structurally normal valve. Cusp separation was  normal. Doppler: Transvalvular velocity was within the normal  range. There was trivial regurgitation.  -------------------------------------------------------------------  Tricuspid valve: Doppler: There was mild regurgitation.  -------------------------------------------------------------------  Right atrium: The atrium was severely dilated.  -------------------------------------------------------------------  Pericardium: There was no pericardial effusion.  -------------------------------------------------------------------  Systemic veins: IVC measured 3.4 cm with < 50% respirophasic  variation, suggesting RA pressure 15 mmHg.  -------------------------------------------------------------------  Measurements  Left ventricle Value Reference  LV ID, ED, PLAX chordal (L) 42.5 mm 43 - 52  LV ID, ES, PLAX chordal 23.7 mm 23 - 38  LV fx shortening, PLAX chordal 44 % >=29  LV PW thickness, ED 19.2 mm ----------  IVS/LV PW ratio, ED 0.94 <=1.3  Stroke volume, 2D 106 ml ----------  Stroke volume/bsa, 2D 41 ml/m^2 ----------  Ventricular septum Value Reference  IVS thickness, ED 18 mm ----------  LVOT Value  Reference  LVOT ID, S 19 mm ----------  LVOT area 2.84 cm^2 ----------  LVOT peak velocity, S 194 cm/s ----------  LVOT mean velocity, S 138 cm/s ----------  LVOT VTI, S 33.8 cm ----------  LVOT peak gradient, S 15 mm Hg ----------  Stroke volume (SV), LVOT DP 95.8 ml ----------  Stroke index (SV/bsa), LVOT DP 37.4 ml/m^2 ----------  Aortic valve Value Reference  Aortic valve peak velocity, S 501 cm/s ----------  Aortic valve mean velocity, S 359 cm/s ----------  Aortic valve VTI, S 99.3 cm ----------  Aortic mean gradient, S 57 mm Hg ----------  Aortic peak gradient, S 100 mm Hg ----------  VTI ratio, LVOT/AV 0.34 ----------  Aortic valve area, VTI 0.97 cm^2 ----------  Aortic valve area/bsa, VTI 0.38 cm^2/m^2 ----------  Velocity ratio, peak, LVOT/AV 0.39 ----------  Aortic valve area, peak velocity 1.1 cm^2 ----------  Aortic valve area/bsa, peak 0.43 cm^2/m^2 ----------  velocity  Velocity ratio, mean, LVOT/AV 0.38 ----------  Aortic valve area, mean velocity 1.09 cm^2 ----------  Aortic valve area/bsa, mean 0.43 cm^2/m^2 ----------  velocity  Aorta Value Reference  Aortic root ID, ED 29 mm ----------  Left atrium Value Reference  LA ID, A-P, ES 77 mm ----------  LA ID/bsa, A-P (H) 3.01 cm/m^2 <=2.2  LA volume, S 287 ml ----------  LA volume/bsa, S 112 ml/m^2 ----------  LA volume, ES, 1-p A4C 260 ml ----------  LA volume/bsa, ES, 1-p A4C 101.5 ml/m^2 ----------  LA volume, ES, 1-p A2C 305 ml ----------  LA volume/bsa, ES, 1-p A2C 119.1 ml/m^2 ----------  Mitral valve Value Reference  Mitral E-wave peak velocity 175 cm/s ----------  Mitral deceleration time 164 ms 150 - 230  Mitral pressure half-time 48 ms ----------  Mitral peak gradient, D 12 mm Hg ----------  Mitral valve area, PHT, DP 4.58 cm^2 ----------  Mitral valve area/bsa, PHT, DP 1.79 cm^2/m^2 ----------  Pulmonary arteries Value Reference  PA pressure, S, DP (H) 66 mm Hg <=30  Tricuspid valve Value  Reference  Tricuspid regurg peak velocity 358 cm/s ----------  Tricuspid peak RV-RA gradient 51 mm Hg ----------  Right atrium Value Reference  RA ID, S-I, ES, A4C (H) 85.8 mm 34 - 49  RA area, ES, A4C (H) 37.6 cm^2 8.3 - 19.5  RA volume, ES, A/L 139 ml ----------  RA volume/bsa, ES, A/L 54.3 ml/m^2 ----------  Systemic veins Value Reference  Estimated CVP 15 mm Hg ----------  Right ventricle Value Reference  TAPSE 11.5 mm ----------  RV s&', lateral, S 10.1 cm/s ----------  Legend:  (L) and (H) mark values outside specified reference range.  -------------------------------------------------------------------  Prepared and Electronically Authenticated by  Loralie Champagne, M.D.  2019-08-16T14:59:32  Lissa Morales  CARDIAC CATHETERIZATION  Order# [id number]  Reading physician: Jolaine Artist, MD Ordering physician: Jolaine Artist, MD Study date: 07/10/18  Physicians  Panel Physicians Referring Physician Case Authorizing Physician  Bensimhon, Shaune Pascal, MD (Primary)    Procedures  RIGHT/LEFT HEART CATH AND CORONARY ANGIOGRAPHY  Conclusion  Findings:  Ao = 118/83 (104)  LV = 207/30  RA = 17  RV = 87/13  PA = 91/34 (54)  PCW = 28  Fick cardiac output/index = 10.3/4.4  PVR = 2.5 WU  FA sat = 99%  PA sat = 78%, 79%  Aortic valve: Mean gradient 67 mmHG Peak gradient 79mHG AVA 1.06 cm2  Assessment:  1. Normal coronaries  2. Normal LV function EF 65%  3. Severe aortic stenosis  4. Severe pulmonary venous HTN with high-output state  Plan/Discussion:  Continue diuresis. Continue w/u for TAVR.  DGlori Bickers MD  8:40 AM   Indications  Severe aortic stenosis [I35.0 (ICD-10-CM)]  Procedural Details/Technique  Technical Details The risks and indication of the procedure were explained. Consent was signed and placed on the chart. An appropriate timeout was taken prior to the procedure.   The right AC fossa was prepped and draped in the routine sterile fashion and  anesthetized with 1% local lidocaine. Using a micropuncture kit and u/s guidance a 5 FR venous sheath was placed in the right brachial vein using a modified Seldinger technique. A standard Swan-Ganz catheter was used for the  procedure.   After a normal Allen's test was confirmed, the right wrist was prepped and draped in the routine sterile fashion and anesthetized with 1% local lidocaine. A 5 FR arterial sheath was then placed in the right radial artery using a modified Seldinger technique. '3mg'$  IV verapamil was given through the sheath. Systemic heparin was administered. Standard catheters including a JL 3.5 and a JR 4 were used. All catheter exchanges were made over a wire. We were able to cross the aortic valve with a JR4 and standard Inquirer wire.   Estimated blood loss <50 mL.  During this procedure the patient was administered the following to achieve and maintain moderate conscious sedation: Versed 2 mg, Fentanyl 25 mcg, while the patient's heart rate, blood pressure, and oxygen saturation were continuously monitored. The period of conscious sedation was 27 minutes, of which I was present face-to-face 100% of this time.  Coronary Findings  Diagnostic  Dominance: Co-dominant  Left Main  Vessel is angiographically normal.  Left Anterior Descending  Vessel is angiographically normal.  Left Circumflex  Vessel is angiographically normal.  Right Coronary Artery  Vessel is angiographically normal.  Intervention  No interventions have been documented.  Wall Motion  Resting    65%    Coronary Diagrams  Diagnostic Diagram     Implants     No implant documentation for this case.  MERGE Images  Link to Procedure Log   Show images for CARDIAC CATHETERIZATION Procedure Log  Hemo Data   Most Recent Value  Fick Cardiac Output 10.33 L/min  Fick Cardiac Output Index 4.35 (L/min)/BSA  Aortic Mean Gradient 66.5 mmHg  Aortic Peak Gradient 92 mmHg  Aortic Valve Area 1.06  Aortic Value Area  Index 0.45 cm2/BSA  RA A Wave -99 mmHg  RA V Wave 21 mmHg  RA Mean 17 mmHg  RV Systolic Pressure 87 mmHg  RV Diastolic Pressure 4 mmHg  RV EDP 13 mmHg  PA Systolic Pressure 91 mmHg  PA Diastolic Pressure 34 mmHg  PA Mean 54 mmHg  PW A Wave -99 mmHg  PW V Wave 31 mmHg  PW Mean 28 mmHg  AO Systolic Pressure 782 mmHg  AO Diastolic Pressure 83 mmHg  AO Mean 956 mmHg  LV Systolic Pressure 213 mmHg  LV Diastolic Pressure 17 mmHg  LV EDP 30 mmHg  AOp Systolic Pressure 086 mmHg  AOp Diastolic Pressure 78 mmHg  AOp Mean Pressure 93 mmHg  LVp Systolic Pressure 578 mmHg  LVp Diastolic Pressure 20 mmHg  LVp EDP Pressure 29 mmHg  QP/QS 1  TPVR Index 12.4 HRUI  TSVR Index 23.89 HRUI  PVR SVR Ratio 0.3  TPVR/TSVR Ratio 0.52   ADDENDUM REPORT: 07/11/2018 13:42  EXAM:  OVER-READ INTERPRETATION CT CHEST  The following report is an over-read performed by radiologist Dr.  Rebekah Chesterfield [facility] on 07/11/2018. This  over-read does not include interpretation of cardiac or coronary  anatomy or pathology. The coronary CTA interpretation by the  cardiologist is attached.  COMPARISON: None.  FINDINGS:  Extracardiac findings will be described separately under dictation  for contemporaneously obtained CTA chest, abdomen and pelvis.  IMPRESSION:  Please see separate dictation for contemporaneously obtained CTA  chest, abdomen and pelvis dated 07/11/2018 for full description of  extracardiac findings.  Electronically Signed  By: Vinnie Langton M.D.  On: 07/11/2018 13:42   Addended by Etheleen Mayhew, MD on 07/11/2018 1:44 PM  Study Result   CLINICAL DATA: Aortic Stenosis  EXAM:  Cardiac TAVR CT  TECHNIQUE:  The patient was scanned on a Siemens Force 956 slice scanner. A 120  kV retrospective scan was triggered in the ascending thoracic aorta  at 140 HU's. Gantry rotation speed was 250 msecs and collimation was  .6 mm. No beta blockade or nitro were given. The 3D  data set was  reconstructed in 5% intervals of the R-R cycle. Systolic and  diastolic phases were analyzed on a dedicated work station using  MPR, MIP and VRT modes. The patient received 80 cc of contrast.  FINDINGS:  Aortic Valve: Trileaflet severely calcified with restricted motion  Aorta: Calcific atherosclerosis with no aneurysm  Sino-tubular Junction: 25 mm  Ascending Thoracic Aorta: 31 mm  Aortic Arch: 29 mm  Descending Thoracic Aorta: 25 mm  Sinus of Valsalva Measurements:  Non-coronary: 29.2 mm  Right - coronary: 27.8 mm  Left - coronary: 29.4 mm  Coronary Artery Height above Annulus:  Left Main: 13 mm above annulus  Right Coronary: 11.2 mm above annulus  Virtual Basal Annulus Measurements:  Maximum / Minimum Diameter: 23.5 mm x 26.5 mm  Perimeter: 81.2 mm  Area: 536 mm2  Coronary Arteries: Sufficient height above annulus for deployment  Optimum Fluoroscopic Angle for Delivery: LAO 13 degrees Caudal 4  degrees  IMPRESSION:  1. Calcified tri leaflet AV with annular area of 536 mm2 suitable  for a 26 mm Sapien 3 valve  2. Optimum angiographic angle for deployment LAO 13 degrees Caudal 4  degrees  3. Coronary arteries sufficient height above annulus for deployment  4. Severe bi atrial enlargement cannot r/o laminated LAA thrombus  5. Normal aortic root 3.1 cm  Jenkins Rouge  Electronically Signed:  By: Jenkins Rouge M.D.  On: 07/11/2018 13:09   CLINICAL DATA: 66 year old male with history of severe aortic  stenosis. Preprocedural study prior to potential transcatheter  aortic valve replacement (TAVR) procedure.  EXAM:  CT ANGIOGRAPHY CHEST, ABDOMEN AND PELVIS  TECHNIQUE:  Multidetector CT imaging through the chest, abdomen and pelvis was  performed using the standard protocol during bolus administration of  intravenous contrast. Multiplanar reconstructed images and MIPs were  obtained and reviewed to evaluate the vascular anatomy.  CONTRAST: 132m ISOVUE-370  IOPAMIDOL (ISOVUE-370) INJECTION 76%  COMPARISON: No priors.  FINDINGS:  CTA CHEST FINDINGS  Cardiovascular: Heart size is severely enlarged with severe left  atrial dilatation. There is no significant pericardial fluid,  thickening or pericardial calcification. There is aortic  atherosclerosis, as well as atherosclerosis of the great vessels of  the mediastinum and the coronary arteries, including calcified  atherosclerotic plaque in the left main, left anterior descending,  left circumflex and right coronary arteries. Severe calcifications  of the aortic valve. Moderate calcifications of the mitral annulus.  Dilatation of the pulmonic trunk (4.5 cm in diameter).  Mediastinum/Lymph Nodes: No pathologically enlarged mediastinal or  hilar lymph nodes. Esophagus is unremarkable in appearance. No  axillary lymphadenopathy.  Lungs/Pleura: Trace right pleural effusion lying dependently.  Several small pulmonary nodules are noted in the right lung, largest  of which is in the medial aspect of the right lower lobe measuring 5  mm (axial image 78 of series 15). No acute consolidative airspace  disease.  Musculoskeletal/Soft Tissues: There are no aggressive appearing  lytic or blastic lesions noted in the visualized portions of the  skeleton.  CTA ABDOMEN AND PELVIS FINDINGS  Hepatobiliary: Liver has a nodular contour, suggesting underlying  cirrhosis. No suspicious cystic or solid hepatic lesions. No intra  or extrahepatic biliary ductal dilatation. Gallbladder is moderately  distended, but otherwise unremarkable in appearance.  Pancreas: No pancreatic mass. No pancreatic or peripancreatic fluid  or inflammatory changes.  Spleen: Unremarkable.  Adrenals/Urinary Tract: 2 mm nonobstructive calculus in the  interpolar collecting system of the right kidney. Extensive scarring  in the right kidney. 3 mm nonobstructive calculus in the lower pole  collecting system of the left kidney.  Subcentimeter low-attenuation  lesion in the interpolar region of the left kidney, too small to  characterize, but statistically likely to represent a cyst. No  hydroureteronephrosis. Urinary bladder is largely obscured by beam  hardening artifact fact from the patient's right hip arthroplasty,  but is unremarkable in appearance.  Stomach/Bowel: status post Roux-en-Y gastric bypass. No pathologic  dilatation of small bowel or colon. Numerous colonic diverticulae  are noted, particularly in the descending colon and sigmoid colon,  without surrounding inflammatory changes to suggest an acute  diverticulitis at this time. Normal appendix.  Vascular/Lymphatic: Aortic atherosclerosis, with vascular findings  and measurements pertinent to potential TAVR procedure, as detailed  below. Please note, that the extensive beam hardening artifact in  the associated image noise does limit assessment of this examination  (as detailed below). No lymphadenopathy identified in the abdomen or  pelvis.  Reproductive: Prostate gland and seminal vesicles are completely  obscured by beam hardening artifact.  Other: No significant volume of ascites. No pneumoperitoneum.  Musculoskeletal: Status post right hip arthroplasty. There are no  aggressive appearing lytic or blastic lesions noted in the  visualized portions of the skeleton.  VASCULAR MEASUREMENTS PERTINENT TO TAVR:  AORTA:  Minimal Aortic Diameter-19 x 17 mm  Severity of Aortic Calcification-mild  RIGHT PELVIS:  Right Common Iliac Artery -  Minimal Diameter-9.7 x 9.9 mm  Tortuosity-mild  Calcification - moderate  Right External Iliac Artery -  Comment: A portion of the distal vessel is completely obscurred by  beam hardening artifact from patient's right hip arthroplasty, and  cannot be adequately evaluated.  Minimal Diameter-8.3 x 7.3 mm  Tortuosity-moderate  Calcification - moderate  Right Common Femoral Artery -  Comment: A portion of the  proximal vessel is completely obscurred by  beam hardening artifact from patient's right hip arthroplasty, and  cannot be adequately evaluated.  Minimal Diameter-9.3 x 6.0 mm  Tortuosity-mild  Calcification - moderate  LEFT PELVIS:  Left Common Iliac Artery -  Minimal Diameter-11.2 x 9.0 mm  Tortuosity-mild  Calcification - moderate  Left External Iliac Artery -  Minimal Diameter-8.7 x 7.2 mm  Tortuosity-moderate  Calcification - moderate  Left Common Femoral Artery -  Comment: Poorly evaluated secondary to extensive image noise.  Minimal Diameter-8.7 x 6.3 mm  Tortuosity-mild  Calcification - moderate  Review of the MIP images confirms the above findings.  IMPRESSION:  1. Vascular findings and measurements pertinent to potential TAVR  procedure, as detailed above. Please note that portions of today's  examination were severely limited by beam hardening artifact from  the patient's right hip arthroplasty, as indicated above.  2. Severe thickening calcification of the aortic valve, compatible  with the reported clinical history of severe aortic stenosis.  3. Moderate calcification of the mitral annulus.  4. Cardiomegaly with severe left atrial dilatation.  5. Aortic atherosclerosis, in addition to left main and 3 vessel  coronary artery disease. Please note that although the presence of  coronary artery calcium documents the presence of coronary artery  disease, the severity of this disease and any potential stenosis  cannot be assessed on this non-gated CT examination. Assessment for  potential risk factor modification, dietary therapy or pharmacologic  therapy may be warranted, if clinically indicated.  6. Severe dilatation of the pulmonic trunk (4.5 cm in diameter),  strongly suggestive of pulmonary arterial hypertension.  7. Trace right pleural effusion lying dependently.  8. Multiple small pulmonary nodules in the right lung measuring 5 mm  or less in size. These are  nonspecific, but are statistically likely  benign. No follow-up needed if patient is low-risk (and has no known  or suspected primary neoplasm). Non-contrast chest CT can be  considered in 12 months if patient is high-risk. This recommendation  follows the consensus statement: Guidelines for Management of  Incidental Pulmonary Nodules Detected on CT Images: From the  Fleischner Society 2017; Radiology 2017; 284:228-243.  9. Small nonobstructive calculi in the collecting systems of both  kidneys.  10. Additional incidental findings, as above.  Electronically Signed  By: Vinnie Langton M.D.  On: 07/11/2018 14:48  Pulmonary function test  Order: [id number]  Status: Edited Result - FINAL  Visible to patient: No (Not Released)  Next appt: 07/25/2018 at 11:00 AM in Cardiology ([facility] PA/NP)  Dx: DOE (dyspnea on exertion); Severe aor...   Ref Range & Units 3d ago  FVC-Pre L 2.30   FVC-%Pred-Pre % 54   FVC-Post L 2.46   FVC-%Pred-Post % 58   FVC-%Change-Post % 6   FEV1-Pre L 1.45   FEV1-%Pred-Pre % 45   FEV1-Post L 1.72   FEV1-%Pred-Post % 53   FEV1-%Change-Post % 18   FEV6-Pre L 2.23   FEV6-%Pred-Pre % 55   FEV6-Post L 2.44   FEV6-%Pred-Post % 60   FEV6-%Change-Post % 9   Pre FEV1/FVC ratio % 63   FEV1FVC-%Pred-Pre % 81   Post FEV1/FVC ratio % 70   FEV1FVC-%Change-Post % 11   Pre FEV6/FVC Ratio % 97   FEV6FVC-%Pred-Pre % 101   Post FEV6/FVC ratio % 99   FEV6FVC-%Pred-Post % 103   FEV6FVC-%Change-Post % 2   FEF 25-75 Pre L/sec 0.67   FEF2575-%Pred-Pre % 23   FEF 25-75 Post L/sec 1.28   FEF2575-%Pred-Post % 45   FEF2575-%Change-Post % 91   RV L 3.35   RV % pred % 133   TLC L 5.79   TLC % pred % 78   DLCO unc ml/min/mmHg 17.47   DLCO unc % pred % 49   DL/VA ml/min/mmHg/L 4.40   DL/VA % pred % 93   Resulting Agency  [facility]    Specimen Collected: 07/11/18 11:01   Last Resulted: 07/11/18 11:43      Impression:  This 67 year old gentleman has stage D, severe,  symptomatic aortic stenosis with New York Heart Association class III symptoms of exertional fatigue and shortness of breath consistent with chronic diastolic congestive heart failure. I have personally reviewed his 2D echocardiogram, right and left heart catheterization, and CTA studies. His echocardiogram shows a trileaflet aortic valve with severely calcified leaflets with restricted mobility. The mean gradient is 57 mmHg consistent with severe aortic stenosis. Left ventricular ejection fraction is 65 to 70% with grade 3 diastolic dysfunction. His right heart catheterization shows severe pulmonary hypertension with PA pressures that were at least half systemic. He has no significant coronary disease. Pulmonary vascular resistance is 2.5 Wood units and cardiac index is 4.4. I agree that aortic valve replacement is indicated in this gentleman for symptomatic relief and to prevent recurrent congestive heart failure and progressive left ventricular deterioration. I think  he would be at high risk for open surgical AVR due to his morbid obesity, severe obstructive airways disease with moderate restriction and moderate decrease in diffusion capacity, in addition to his other comorbid risk factors. I think transcatheter aortic valve replacement would be the best alternative for him. His gated cardiac CTA shows anatomy that is suitable for transcatheter aortic valve replacement. His abdominal and pelvic CTA shows pelvic arterial anatomy that is adequate for transfemoral insertion.  The patient was counseled at length regarding treatment alternatives for management of severe symptomatic aortic stenosis. The risks and benefits of surgical intervention has been discussed in detail. Long-term prognosis with medical therapy was discussed. Alternative approaches such as conventional surgical aortic valve replacement, transcatheter aortic valve replacement, and palliative medical therapy were compared and contrasted at length.  This discussion was placed in the context of the patient's own specific clinical presentation and past medical history. All of his questions have been addressed.  Following the decision to proceed with transcatheter aortic valve replacement, a discussion was held regarding what types of management strategies would be attempted intraoperatively in the event of life-threatening complications, including whether or not the patient would be considered a candidate for the use of cardiopulmonary bypass and/or conversion to open sternotomy for attempted surgical intervention.  The patient has been advised of a variety of complications that might develop including but not limited to risks of death, stroke, paravalvular leak, aortic dissection or other major vascular complications, aortic annulus rupture, device embolization, cardiac rupture or perforation, mitral regurgitation, acute myocardial infarction, arrhythmia, heart block or bradycardia requiring permanent pacemaker placement, congestive heart failure, respiratory failure, renal failure, pneumonia, infection, other late complications related to structural valve deterioration or migration, or other complications that might ultimately cause a temporary or permanent loss of functional independence or other long term morbidity. The patient provides full informed consent for the procedure as described and all questions were answered.   Plan:   Transfemoral transcatheter aortic valve replacement.  Gaye Pollack, MD

## 2018-07-25 NOTE — Anesthesia Preprocedure Evaluation (Addendum)
Anesthesia Evaluation  Patient identified by MRN, date of birth, ID band Patient awake    Reviewed: Allergy & Precautions, H&P , NPO status , Patient's Chart, lab work & pertinent test results  Airway Mallampati: II   Neck ROM: full    Dental   Pulmonary sleep apnea , former smoker,    breath sounds clear to auscultation       Cardiovascular hypertension, + Peripheral Vascular Disease and +CHF  + dysrhythmias Atrial Fibrillation + Valvular Problems/Murmurs AS  Rhythm:regular Rate:Normal     Neuro/Psych    GI/Hepatic   Endo/Other  obese  Renal/GU      Musculoskeletal  (+) Arthritis ,   Abdominal   Peds  Hematology  (+) anemia ,   Anesthesia Other Findings   Reproductive/Obstetrics                             Anesthesia Physical Anesthesia Plan  ASA: IV  Anesthesia Plan: General   Post-op Pain Management:    Induction: Intravenous  PONV Risk Score and Plan: 2 and Ondansetron, Dexamethasone and Treatment may vary due to age or medical condition  Airway Management Planned: Oral ETT  Additional Equipment: Arterial line and CVP  Intra-op Plan:   Post-operative Plan: Extubation in OR  Informed Consent: I have reviewed the patients History and Physical, chart, labs and discussed the procedure including the risks, benefits and alternatives for the proposed anesthesia with the patient or authorized representative who has indicated his/her understanding and acceptance.     Plan Discussed with: CRNA, Anesthesiologist and Surgeon  Anesthesia Plan Comments:        Anesthesia Quick Evaluation

## 2018-07-25 NOTE — Anesthesia Procedure Notes (Signed)
Procedure Name: Intubation Date/Time: 07/25/2018 7:47 AM Performed by: Moshe Salisbury, CRNA Pre-anesthesia Checklist: Patient identified, Emergency Drugs available, Suction available and Patient being monitored Patient Re-evaluated:Patient Re-evaluated prior to induction Oxygen Delivery Method: Circle System Utilized Preoxygenation: Pre-oxygenation with 100% oxygen Induction Type: IV induction Ventilation: Mask ventilation without difficulty Laryngoscope Size: Mac and 4 Grade View: Grade III Tube type: Oral Tube size: 8.0 mm Number of attempts: 1 Airway Equipment and Method: Stylet Placement Confirmation: ETT inserted through vocal cords under direct vision,  positive ETCO2 and breath sounds checked- equal and bilateral Secured at: 22 cm Tube secured with: Tape Dental Injury: Teeth and Oropharynx as per pre-operative assessment

## 2018-07-25 NOTE — Op Note (Signed)
[facility]   MULTIDISCIPLINARY HEART VALVE TEAM   TAVR OPERATIVE NOTE   Date of Procedure:  07/25/2018  Preoperative Diagnosis: Severe Aortic Stenosis   Postoperative Diagnosis: Same   Procedure:    Transcatheter Aortic Valve Replacement - Percutaneous Left Transfemoral Approach  Edwards Sapien 3 THV (size 26 mm, model # 9600TFX, serial # [id number])   Co-Surgeons:  Alleen Borne, MD and Verne Carrow, MD    Anesthesiologist:  Achille Rich, MD  Echocardiographer:  Tobias Alexander, MD  Pre-operative Echo Findings:  Severe aortic stenosis  Normal left ventricular systolic function  Post-operative Echo Findings:  No paravalvular leak  Normal left ventricular systolic function   BRIEF CLINICAL NOTE AND INDICATIONS FOR SURGERY  The patient is a 67 year old gentleman with a history of hypertension, hyperlipidemia, diabetes, chronic atrial fibrillation, morbid obesity status post gastric bypass surgery, sleep apnea on CPAP, pulmonary hypertension, chronic diastolic congestive heart failure, and severe aortic stenosis he was referred for consideration of transcatheter aortic valve replacement. He has been followed for aortic stenosis for several years. An echocardiogram on 02/09/2018 showed severe aortic stenosis with a mean gradient of 40 mmHg and a peak gradient of 76 mmHg. The dimensionless index was 0.28. Left ventricular ejection fraction was 60 to 65% with grade 3 diastolic dysfunction. There was no mitral stenosis or regurgitation. He was seen by Dr. Clifton James in clinic for consideration of transcatheter aortic valve replacement and was scheduled for right and left heart catheterization on 07/06/2018. Catheterization showed severe elevation of his right heart pressures with a mean right atrial pressure of 26 mmHg and RV end-diastolic pressure of 24 mmHg. Due to the size of his atrium was not possible to pass the pulmonary artery catheter out into the  pulmonary artery. The case was aborted after the right heart cath due to respiratory distress during the procedure. With his severe pulmonary hypertension documented by previous echocardiogram he was admitted to the advanced heart failure team for optimization and diuresis. The patient reports worsening dyspnea on exertion and lower extremity edema prior to admission. He has not had any chest discomfort. Denies dizziness and syncope.  A repeat echocardiogram on 07/07/2018 showed progression of his aortic stenosis with a rise in the mean gradient to 57 mmHg and a peak gradient of 100 mmHg. The right ventricle was mildly dilated. Left ventricular ejection fraction was 65 to 70%. He subsequently underwent repeat right and left heart cath on 07/10/2018. This showed normal coronary arteries. There is severe pulmonary hypertension with a PA pressure of 91/34 and a wedge pressure of 28. Cardiac index was 4.4. Pulmonary vascular resistance was 2.5 Wood units. PA sat was 79% with a peripheral side of 99%. Right atrial pressure was 17. The LVEDP was 30. Optimization with diuresis was continued by the heart failure team. Due to bad teeth he was evaluated by dental medicine and underwent extraction of multiple teeth.   He has stage D, severe, symptomatic aortic stenosis with New York Heart Association class III symptoms of exertional fatigue and shortness of breath consistent with chronic diastolic congestive heart failure. I have personally reviewed his 2D echocardiogram, right and left heart catheterization, and CTA studies. His echocardiogram shows a trileaflet aortic valve with severely calcified leaflets with restricted mobility. The mean gradient is 57 mmHg consistent with severe aortic stenosis. Left ventricular ejection fraction is 65 to 70% with grade 3 diastolic dysfunction. His right heart catheterization shows severe pulmonary hypertension with PA pressures that were at least half  systemic. He has no significant  coronary disease. Pulmonary vascular resistance is 2.5 Wood units and cardiac index is 4.4. I agree that aortic valve replacement is indicated in this gentleman for symptomatic relief and to prevent recurrent congestive heart failure and progressive left ventricular deterioration. I think he would be at high risk for open surgical AVR due to his morbid obesity, severe obstructive airways disease with moderate restriction and moderate decrease in diffusion capacity, in addition to his other comorbid risk factors. I think transcatheter aortic valve replacement would be the best alternative for him. His gated cardiac CTA shows anatomy that is suitable for transcatheter aortic valve replacement. His abdominal and pelvic CTA shows pelvic arterial anatomy that is adequate for transfemoral insertion.   The patient was counseled at length regarding treatment alternatives for management of severe symptomatic aortic stenosis. The risks and benefits of surgical intervention has been discussed in detail. Long-term prognosis with medical therapy was discussed. Alternative approaches such as conventional surgical aortic valve replacement, transcatheter aortic valve replacement, and palliative medical therapy were compared and contrasted at length. This discussion was placed in the context of the patient's own specific clinical presentation and past medical history. All of his questions have been addressed.  Following the decision to proceed with transcatheter aortic valve replacement, a discussion was held regarding what types of management strategies would be attempted intraoperatively in the event of life-threatening complications, including whether or not the patient would be considered a candidate for the use of cardiopulmonary bypass and/or conversion to open sternotomy for attempted surgical intervention.   The patient has been advised of a variety of complications that might develop including but not limited to risks  of death, stroke, paravalvular leak, aortic dissection or other major vascular complications, aortic annulus rupture, device embolization, cardiac rupture or perforation, mitral regurgitation, acute myocardial infarction, arrhythmia, heart block or bradycardia requiring permanent pacemaker placement, congestive heart failure, respiratory failure, renal failure, pneumonia, infection, other late complications related to structural valve deterioration or migration, or other complications that might ultimately cause a temporary or permanent loss of functional independence or other long term morbidity. The patient provides full informed consent for the procedure as described and all questions were answered.    DETAILS OF THE OPERATIVE PROCEDURE  PREPARATION:    The patient is brought to the operating room on the above mentioned date and central monitoring was established by the anesthesia team including placement of a central venous line and radial arterial line. The patient is placed in the supine position on the operating table.  Intravenous antibiotics are administered. General endotracheal anesthesia is induced uneventfully.   A Foley catheter is placed.  Baseline transesophageal echocardiogram was performed. This showed marked enlargement of the left and right atrium with no thrombus seen in the LAA.  The patient's chest, abdomen, both groins, and both lower extremities are prepared and draped in a sterile manner. A time out procedure is performed.   PERIPHERAL ACCESS:    Using the modified Seldinger technique, femoral arterial and venous access was obtained with placement of 6 Fr sheaths on the right side.  A pigtail diagnostic catheter was passed through the right arterial sheath under fluoroscopic guidance into the aortic root.  A temporary transvenous pacemaker catheter was passed through the right femoral venous sheath under fluoroscopic guidance into the right ventricle.  The pacemaker was  tested to ensure stable lead placement and pacemaker capture. Aortic root angiography was performed in order to determine the optimal angiographic angle  for valve deployment.   TRANSFEMORAL ACCESS:   Percutaneous transfemoral access and sheath placement was performed using ultrasound guidance.  The left common femoral artery was cannulated using a micropuncture needle and appropriate location was verified using hand injection angiogram.  A pair of Abbott Perclose percutaneous closure devices were placed and a 6 French sheath replaced into the femoral artery.  The patient was heparinized systemically and ACT verified > 250 seconds.    A 14 Fr transfemoral E-sheath was introduced into the left femoral artery after progressively dilating over an Amplatz superstiff wire. An AL-2 catheter was used to direct a straight-tip exchange length wire across the native aortic valve into the left ventricle. This was exchanged out for a pigtail catheter and position was confirmed in the LV apex. Simultaneous LV and Ao pressures were recorded.  The pigtail catheter was exchanged for an Amplatz Extra-stiff wire in the LV apex.  Echocardiography was utilized to confirm appropriate wire position and no sign of entanglement in the mitral subvalvular apparatus.   BALLOON AORTIC VALVULOPLASTY:   Balloon aortic valvuloplasty was performed using a 23 mm valvuloplasty balloon.  Once optimal position was achieved, BAV was done under rapid ventricular pacing. The patient recovered well hemodynamically.    TRANSCATHETER HEART VALVE DEPLOYMENT:   An Edwards Sapien 3 transcatheter heart valve (size 26 mm, model #[id number], serial #[id number]) was prepared and crimped per manufacturer's guidelines, and the proper orientation of the valve is confirmed on the [employer] delivery system. The valve was advanced through the introducer sheath using normal technique until in an appropriate position in the abdominal aorta beyond the  sheath tip. The balloon was then retracted and using the fine-tuning wheel was centered on the valve. The valve was then advanced across the aortic arch using appropriate flexion of the catheter. The valve was carefully positioned across the aortic valve annulus. The Commander catheter was retracted using normal technique. Once final position of the valve has been confirmed by angiographic assessment, the valve is deployed while temporarily holding ventilation and during rapid ventricular pacing to maintain systolic blood pressure < 50 mmHg and pulse pressure < 10 mmHg. The balloon inflation is held for >3 seconds after reaching full deployment volume. Once the balloon has fully deflated the balloon is retracted into the ascending aorta and valve function is assessed using echocardiography. There is felt to be no paravalvular leak and no central aortic insufficiency.  The patient's hemodynamic recovery following valve deployment is good.  The deployment balloon and guidewire are both removed.    PROCEDURE COMPLETION:   The sheath was removed and femoral artery closure performed using the previously placed Perclose devices.  Protamine was administered once femoral arterial repair was complete. The temporary pacemaker, pigtail catheters and femoral sheaths were removed with manual pressure used for hemostasis.   The patient tolerated the procedure well and is transported to the surgical intensive care in stable condition. There were no immediate intraoperative complications. All sponge instrument and needle counts are verified correct at completion of the operation.   No blood products were administered during the operation.  The patient received a total of 60 mL of intravenous contrast during the procedure.   Alleen Borne, MD 07/25/2018 10:08 AM

## 2018-07-25 NOTE — OR Nursing (Signed)
First call to [facility] at 0901. Spoke to [place]. Second call to [facility] at [phone]. Spoke to [place].

## 2018-07-25 NOTE — CV Procedure (Signed)
[facility]  TAVR OPERATIVE NOTE   Date of Procedure:  07/25/2018  Preoperative Diagnosis: Severe Aortic Stenosis   Postoperative Diagnosis: Same   Procedure:    Transcatheter Aortic Valve Replacement - Transfemoral Approach  Edwards Sapien 3 THV (size 26 mm, model # [id number], serial # [id number])   Co-Surgeons:  Lauree Chandler, MD and Gaye Pollack, MD   Anesthesiologist:  Hodierne  Echocardiographer:  Meda Coffee  Pre-operative Echo Findings:  Severe aortic stenosis  Normal left ventricular systolic function  Post-operative Echo Findings:  No paravalvular leak  Normal left ventricular systolic function  BRIEF CLINICAL NOTE AND INDICATIONS FOR SURGERY  67 yo male with DM, HTN, HLD, atrial fibrillation, severe AS, severe pulmonary HTN, with recent worsening of dyspnea and CHF.   During the course of the patient's preoperative work up they have been evaluated comprehensively by a multidisciplinary team of specialists coordinated through the [facility] in the [facility].  They have been demonstrated to suffer from symptomatic severe aortic stenosis as noted above. The patient has been counseled extensively as to the relative risks and benefits of all options for the treatment of severe aortic stenosis including long term medical therapy, conventional surgery for aortic valve replacement, and transcatheter aortic valve replacement.  The patient has been independently evaluated by two cardiac surgeons including Dr Cyndia Bent, and they are felt to be at high risk for conventional surgical aortic valve replacement. Both surgeons indicated the patient would be a poor candidate for conventional surgery. Based upon review of all of the patient's preoperative diagnostic tests they are felt to be candidate for transcatheter aortic valve replacement using the transfemoral approach as an alternative to high risk conventional  surgery.    Following the decision to proceed with transcatheter aortic valve replacement, a discussion has been held regarding what types of management strategies would be attempted intraoperatively in the event of life-threatening complications, including whether or not the patient would be considered a candidate for the use of cardiopulmonary bypass and/or conversion to open sternotomy for attempted surgical intervention.  The patient has been advised of a variety of complications that might develop peculiar to this approach including but not limited to risks of death, stroke, paravalvular leak, aortic dissection or other major vascular complications, aortic annulus rupture, device embolization, cardiac rupture or perforation, acute myocardial infarction, arrhythmia, heart block or bradycardia requiring permanent pacemaker placement, congestive heart failure, respiratory failure, renal failure, pneumonia, infection, other late complications related to structural valve deterioration or migration, or other complications that might ultimately cause a temporary or permanent loss of functional independence or other long term morbidity.  The patient provides full informed consent for the procedure as described and all questions were answered preoperatively.    DETAILS OF THE OPERATIVE PROCEDURE  PREPARATION:   The patient is brought to the operating room on the above mentioned date and central monitoring was established by the anesthesia team including placement of a radial arterial line. The patient is placed in the supine position on the operating table.  Intravenous antibiotics are administered. General anesthesia is used. A Foley catheter is placed.  Baseline transesophageal echocardiogram was performed. The patient's chest, abdomen, both groins, and both lower extremities are prepared and draped in a sterile manner. A time out procedure is performed.   PERIPHERAL ACCESS:   Using the modified  Seldinger technique, femoral arterial and venous access were obtained with placement of 6 Fr sheaths on the right side  using u/s guidance. A pigtail diagnostic catheter was passed through the femoral arterial sheath under fluoroscopic guidance into the aortic root.  A temporary transvenous pacemaker catheter was passed through the femoral venous sheath under fluoroscopic guidance into the right ventricle.  The pacemaker was tested to ensure stable lead placement and pacemaker capture. Aortic root angiography was performed in order to determine the optimal angiographic angle for valve deployment.  TRANSFEMORAL ACCESS:  A micropuncture kit was used to gain access to the left femoral artery using u/s guidance. Position confirmed with angiography. Pre-closure with double ProGlide closure devices. The patient was heparinized systemically and ACT verified > 250 seconds.    A 14 Fr transfemoral E-sheath was introduced into the left femoral artery after progressively dilating over an Amplatz superstiff wire. An AL-2 catheter was used to direct a straight-tip exchange length wire across the native aortic valve into the left ventricle. This was exchanged out for a pigtail catheter and position was confirmed in the LV apex. Simultaneous LV and Ao pressures were recorded.  The pigtail catheter was then exchanged for an Amplatz Extra-stiff wire in the LV apex.   TRANSCATHETER HEART VALVE DEPLOYMENT:  An Edwards Sapien 3 THV (size 26 mm) was prepared and crimped per manufacturer's guidelines, and the proper orientation of the valve is confirmed on the [employer] delivery system. The valve was advanced through the introducer sheath using normal technique until in an appropriate position in the abdominal aorta beyond the sheath tip. The balloon was then retracted and using the fine-tuning wheel was centered on the valve. The valve was then advanced across the aortic arch using appropriate flexion of the catheter. The  valve was carefully positioned across the aortic valve annulus. The Commander catheter was retracted using normal technique. Once final position of the valve has been confirmed by angiographic assessment, the valve is deployed while temporarily holding ventilation and during rapid ventricular pacing to maintain systolic blood pressure < 50 mmHg and pulse pressure < 10 mmHg. The balloon inflation is held for >3 seconds after reaching full deployment volume. Once the balloon has fully deflated the balloon is retracted into the ascending aorta and valve function is assessed using TEE. There is felt to be no paravalvular leak and no central aortic insufficiency.  The patient's hemodynamic recovery following valve deployment is good.  The deployment balloon and guidewire are both removed. Echo demostrated acceptable post-procedural gradients, stable mitral valve function, and no AI.   PROCEDURE COMPLETION:  The sheath was then removed and closure devices were completed. Protamine was administered once femoral arterial repair was complete. The temporary pacemaker, pigtail catheters and femoral sheaths were removed with manual pressure used for hemostasis.   The patient tolerated the procedure well and is transported to the surgical intensive care in stable condition. There were no immediate intraoperative complications. All sponge instrument and needle counts are verified correct at completion of the operation.   No blood products were administered during the operation.  The patient received a total of 60 mL of intravenous contrast during the procedure.  Lauree Chandler MD 07/25/2018 9:49 AM

## 2018-07-25 NOTE — Progress Notes (Signed)
  [facility]   MULTIDISCIPLINARY HEART VALVE TEAM  Patient doing well s/p TAVR. He is hemodynamically stable. Groin sites stable. ECG with chronic afib with periods of slow ventricular response, HR in 30s when sleeping. No complaints. CXR with some mild pulmonary vascular congestion. BNP mildly elevated on pre admission labs. I will give him one dose of IV lasix today and will resume home Torsemide 40 mg daily tomorrow.     Cline Crock PA-C  [facility]  Pager [phone]

## 2018-07-25 NOTE — Transfer of Care (Signed)
Immediate Anesthesia Transfer of Care Note  Patient: Joel Rogers  Procedure(s) Performed: TRANSCATHETER AORTIC VALVE REPLACEMENT, TRANSFEMORAL (N/A Chest) TRANSESOPHAGEAL ECHOCARDIOGRAM (TEE) (N/A )  Patient Location: PACU  Anesthesia Type:General  Level of Consciousness: drowsy  Airway & Oxygen Therapy: Patient Spontanous Breathing and Patient connected to nasal cannula oxygen  Post-op Assessment: Report given to RN, Post -op Vital signs reviewed and stable and Patient moving all extremities  Post vital signs: Reviewed and stable  Last Vitals:  Vitals Value Taken Time  BP 108/63 07/25/2018  9:52 AM  Temp    Pulse 54 07/25/2018  9:55 AM  Resp 12 07/25/2018  9:55 AM  SpO2 100 % 07/25/2018  9:55 AM  Vitals shown include unvalidated device data.  Last Pain:  Vitals:   07/25/18 0622  TempSrc: Oral  PainSc:       Patients Stated Pain Goal: 3 (07/25/18 0606)  Complications: No apparent anesthesia complications

## 2018-07-25 NOTE — Interval H&P Note (Signed)
History and Physical Interval Note:  07/25/2018 7:54 AM  [name]  has presented today for surgery, with the diagnosis of Severe Aortic Stenosis  The various methods of treatment have been discussed with the patient and family. After consideration of risks, benefits and other options for treatment, the patient has consented to  Procedure(s): TRANSCATHETER AORTIC VALVE REPLACEMENT, TRANSFEMORAL (N/A) TRANSESOPHAGEAL ECHOCARDIOGRAM (TEE) (N/A) as a surgical intervention .  The patient's history has been reviewed, patient examined, no change in status, stable for surgery.  I have reviewed the patient's chart and labs.  Questions were answered to the patient's satisfaction.     Joel Rogers

## 2018-07-26 ENCOUNTER — Other Ambulatory Visit: Payer: Self-pay | Admitting: Physician Assistant

## 2018-07-26 ENCOUNTER — Encounter (HOSPITAL_COMMUNITY): Payer: Self-pay | Admitting: Cardiovascular Disease

## 2018-07-26 ENCOUNTER — Inpatient Hospital Stay (HOSPITAL_COMMUNITY): Payer: Medicare Other

## 2018-07-26 DIAGNOSIS — I350 Nonrheumatic aortic (valve) stenosis: Principal | ICD-10-CM

## 2018-07-26 DIAGNOSIS — Z952 Presence of prosthetic heart valve: Secondary | ICD-10-CM

## 2018-07-26 DIAGNOSIS — I361 Nonrheumatic tricuspid (valve) insufficiency: Secondary | ICD-10-CM

## 2018-07-26 LAB — BASIC METABOLIC PANEL
Anion gap: 7 (ref 5–15)
BUN: 15 mg/dL (ref 8–23)
CHLORIDE: 105 mmol/L (ref 98–111)
CO2: 26 mmol/L (ref 22–32)
CREATININE: 1.02 mg/dL (ref 0.61–1.24)
Calcium: 9 mg/dL (ref 8.9–10.3)
GFR calc non Af Amer: >60 mL/min (ref >60)
Glucose, Bld: 111 mg/dL — ABNORMAL HIGH (ref 70–99)
Potassium: 4.1 mmol/L (ref 3.5–5.1)
Sodium: 138 mmol/L (ref 135–145)

## 2018-07-26 LAB — CBC
HEMATOCRIT: 36.6 % — AB (ref 39.0–52.0)
HEMOGLOBIN: 12.2 g/dL — AB (ref 13.0–17.0)
MCH: 24.5 pg — AB (ref 26.0–34.0)
MCHC: 33.3 g/dL (ref 30.0–36.0)
MCV: 73.6 fL — ABNORMAL LOW (ref 78.0–100.0)
Platelets: 240 10*3/uL (ref 150–400)
RBC: 4.97 MIL/uL (ref 4.22–5.81)
RDW: 17 % — ABNORMAL HIGH (ref 11.5–15.5)
WBC: 12.4 10*3/uL — ABNORMAL HIGH (ref 4.0–10.5)

## 2018-07-26 LAB — MAGNESIUM: Magnesium: 1.9 mg/dL (ref 1.7–2.4)

## 2018-07-26 LAB — ECHOCARDIOGRAM COMPLETE
Height: 72 in
Weight: 3915.2 oz

## 2018-07-26 MED ORDER — DILTIAZEM HCL ER COATED BEADS 120 MG PO CP24
120.0000 mg | ORAL_CAPSULE | Freq: Every day | ORAL | Status: DC
  Administered 2018-07-26: 120 mg via ORAL
  Filled 2018-07-26: qty 1

## 2018-07-26 MED ORDER — TORSEMIDE 20 MG PO TABS
40.0000 mg | ORAL_TABLET | Freq: Every day | ORAL | Status: DC
  Administered 2018-07-26: 40 mg via ORAL
  Filled 2018-07-26 (×2): qty 2

## 2018-07-26 MED ORDER — ASPIRIN 81 MG PO CHEW: 81.0000 mg | CHEWABLE_TABLET | Freq: Every day | ORAL | Status: DC

## 2018-07-26 MED ORDER — APIXABAN 5 MG PO TABS: 5.0000 mg | ORAL_TABLET | Freq: Two times a day (BID) | ORAL | 1 refills | Status: DC

## 2018-07-26 MED ORDER — SPIRONOLACTONE 12.5 MG HALF TABLET
12.5000 mg | ORAL_TABLET | Freq: Every day | ORAL | Status: DC
  Administered 2018-07-26: 12.5 mg via ORAL
  Filled 2018-07-26: qty 1

## 2018-07-26 NOTE — Care Management Note (Addendum)
Case Management Note Donn Pierini RN, BSN Unit 4E- RN Care Coordinator  [phone]  Patient Details  Name: Joel Rogers MRN: [id number] Date of Birth: [birth year]  Subjective/Objective:     Pt admitted with severe aortic stenosis s/p TAVR               Action/Plan: PTA pt lived at home with wife, plan to return home, referral regarding Eliquis- per insurance check copay $45- spoke with pt at bedside- per pt he has been on Eliquis before- copay cost was $45 at that time also. Pt not sure if he has ever used 30 day free card- card provided to use if able pt understands that he may not be able to since he has been on drug in past- no other CM needs noted for transition home.   Expected Discharge Date:  07/26/18               Expected Discharge Plan:  Home/Self Care  In-House Referral:  NA  Discharge planning Services  CM Consult, Medication Assistance  Post Acute Care Choice:    Choice offered to:     DME Arranged:    DME Agency:     HH Arranged:    HH Agency:     Status of Service:  Completed, signed off  If discussed at Long Length of Stay Meetings, dates discussed:    Discharge Disposition: home/self care   Additional Comments:  Darrold Span, RN 07/26/2018, 4:16 PM

## 2018-07-26 NOTE — Progress Notes (Signed)
D/c instructions given to pt. Wound care and weight restrictions reviewed. Telemetry removed. IV and central line removed. PT to be escorted home by wife.  Versie Starks, RN

## 2018-07-26 NOTE — Progress Notes (Signed)
CARDIAC REHAB PHASE I   PRE:  Rate/Rhythm: 87 afib  BP:  Supine:   Sitting: 142/96  Standing:    SaO2: 98%RA  MODE:  Ambulation: 470 ft   POST:  Rate/Rhythm: 124 afib  BP:  Supine:   Sitting: 174/88  Standing:    SaO2: 96%RA 1045-1120 Pt walked 470 ft on RA with steady gait independently. Tolerated well. Pt stated breathing better and able to walk farther than before procedure.  Discussed CRP 2 but pt prefers to walk on his own. Gave CHF booklet. Pt knew he was to watch sodium, weigh daily and limit fluid as cardiology had discussed with him. Gave pt low sodium handouts with 2000 mg restriction, encouraged walking as tolerated and reviewed yellow zone of when to call MD in booklet. Pt very motivated.   Luetta Nutting, RN BSN  07/26/2018 11:17 AM

## 2018-07-26 NOTE — Discharge Instructions (Signed)
ACTIVITY AND EXERCISE °• Daily activity and exercise are an important part of your recovery. People recover at different rates depending on their general health and type of valve procedure. °• Most people recovering from TAVR feel better relatively quickly  °• No lifting, pushing, pulling more than 10 pounds (examples to avoid: groceries, vacuuming, gardening, golfing): °            - For one week with a procedure through the groin. °            - For six weeks for procedures through the chest wall. °            - For three months for procedures through the breast-bone. °NOTE: You will typically see one of our providers 7-10 days after your procedure to discuss WHEN TO RESUME the above activities.  °  °  °DRIVING °• Do not drive for until you are seen for follow up and cleared by a provider. °• If you have been told by your doctor in the past that you may not drive, you must talk with him/her before you begin driving again. °  °  °DRESSING °• Groin site: you may leave the clear dressing over the site for up to one week or until it falls off. °  °  °HYGIENE °• If you had a femoral (leg) procedure, you may take a shower when you return home. After the shower, pat the site dry. Do NOT use powder, oils or lotions in your groin area until the site has completely healed. °• If you had a chest procedure, you may shower when you return home unless specifically instructed not to by your discharging practitioner. °            - DO NOT scrub incision; pat dry with a towel °            - DO NOT apply any lotions, oils, powders to the incision °            - No tub baths / swimming for at least 2 weeks. °• If you notice any fevers, chills, increased pain, swelling, bleeding or pus, please contact your doctor. °  °ADDITIONAL INFORMATION °• If you are going to have an upcoming dental procedure, please contact our office as you will require antibiotics ahead of time to prevent infection on your heart valve.  ° ° ° ° ° °After TAVR  Checklist ° °Check  Test Description  ° Follow up appointment in 1-2 weeks  You will see our structural heart physician assistant, Katie Permelia Bamba. Your incision sites will be checked and you will be cleared to drive and resume all normal activities if you are doing well.    ° 1 month echo and follow up  You will have an echo to check on your new heart valve and be seen back in the office by Katie Esley Brooking. Many times the echo is not read by your appointment time, but Katie will call you later that day or the following day to report your results.  ° Follow up with your primary cardiologist You will need to be seen by your primary cardiologist in the following 3-6 months after your 1 month appointment in the valve clinic. Often times your Plavix or Aspirin will be discontinued during this time, but this is decided on a case by case basis.   ° 1 year echo and follow up You will have another echo to check on your heart valve   after 1 year and be seen back in the office by Katie Davie Claud. This your last structural heart visit.  ° Bacterial endocarditis prophylaxis  You will have to take antibiotics for the rest of your life before all dental procedures (even teeth cleanings) to protect your heart valve. Antibiotics are also required before some surgeries. Please check with your cardiologist before scheduling any surgeries. Also, please make sure to tell us if you have a penicillin allergy as you will require an alternative antibiotic.   ° ° °

## 2018-07-26 NOTE — Care Management (Signed)
07-26-18  BENEFITS CHECK :  # 2.   Joel Rogers  @  [facility] # [phone]  1. ELIQUIS   2.5 MG BID     COVER- YES CO-PAY- $ 45.00   TIER-  3 DRUG PRIOR APPROVAL- NO  2. ELIQUIS  5 MG BID COVER- YES CO-PAY- $ 45.00 TIER- 3 DRUG PRIOR APPROVAL- NO  3. XARELTO 15 MG  BID COVER- YES CO-PAY-  $ 45.00 TIER-3 DRUG PRIOR APPROVAL- NO  4. XARELTO 20 MG DAILY COVER- YES CO-PAY- $ 45.00 TIER- 3 DRUG PRIOR APPROVAL- NO  PREFERRED PHARMACY : YES [facility]  AND [facility] M/O 90 DAY SUPPLY FOR M/O $ 125.0

## 2018-07-26 NOTE — Progress Notes (Signed)
  Echocardiogram 2D Echocardiogram has been performed.  Post TAVR  Joel Rogers L Androw 07/26/2018, 1:29 PM

## 2018-07-26 NOTE — Discharge Summary (Addendum)
[facility]   MULTIDISCIPLINARY HEART VALVE TEAM   Discharge Summary    Patient ID: Joel Rogers,  MRN: [id number], DOB/AGE: 1951-09-26 68 y.o.  Admit date: 07/25/2018 Discharge date: 07/26/2018  Primary Care Provider: Allayne Butcher B Primary Cardiologist: Dr. Allyson Sabal / Dr. Clifton James & Dr. Cornelius Moras (TAVR)   Discharge Diagnoses    Principal Problem:   S/P TAVR (transcatheter aortic valve replacement) Active Problems:   Morbid obesity (HCC)   Obstructive sleep apnea   Chronic atrial fibrillation (HCC)   HTN (hypertension)   Hyperlipemia   Status post gastric bypass for obesity   Severe aortic stenosis   Pulmonary hypertension, unspecified (HCC)   Acute on chronic diastolic heart failure (HCC)   History of subdural hematoma   Atrial fibrillation, chronic (HCC)   Allergies No Known Allergies   History of Present Illness     Joel Rogers is a 67 y.o. male with a history of HTN, HLD, DMT2, chronic afib (not on OAC), morbid obesity s/p gastric bypass, OSA on CPAP, pulmonary HTN, chronic diastolic CHF, subdural hematoma after traumatic fall, and severe AS who presented to [facility] on 07/25/18 for planned TAVR.   He has been followed for aortic stenosis for several years. An echocardiogram on 02/09/2018 showed severe aortic stenosis with a mean gradient of 40 mmHg and a peak gradient of 76 mmHg. The dimensionless index was 0.28. Left ventricular ejection fraction was 60 to 65% with grade 3 diastolic dysfunction. There was no mitral stenosis or regurgitation. He was seen by Dr. Clifton James in clinic for consideration of transcatheter aortic valve replacement and was scheduled for right and left heart catheterization on 07/06/2018. Catheterization showed severe elevation of his right heart pressures with a mean right atrial pressure of 26 mmHg and RV end-diastolic pressure of 24 mmHg. Due to the size of his atrium was not possible to pass the pulmonary artery catheter out into the pulmonary  artery. The case was aborted after the right heart cath due to respiratory distress during the procedure. With his severe pulmonary hypertension documented by previous echocardiogram he was admitted to the advanced heart failure team for optimization and diuresis.The patient reports worsening dyspnea on exertion and lower extremity edema prior to admission. He has not had any chest discomfort. Denies dizziness and syncope. A repeat echocardiogram on 07/07/2018 showed progression of his aortic stenosis with a rise in the mean gradient to 57 mmHg and a peak gradient of 100 mmHg. The right ventricle was mildly dilated. Left ventricular ejection fraction was 65 to 70%. He subsequently underwent repeat right and left heart cath on 07/10/2018. This showed normal coronary arteries. There is severe pulmonary hypertension with a PA pressure of 91/34 and a wedge pressure of 28. Cardiac index was 4.4. Pulmonary vascular resistance was 2.5 Wood units. PA sat was 79% with a peripheral side of 99%. Right atrial pressure was 17. The LVEDP was 30. Optimization with diuresis was continued by the heart failure team. Due to bad teeth he was evaluated by dental medicine and underwent extraction of multiple teeth.   He was discharged home with plans to return on 07/25/18 for TAVR.   Hospital Course     Consultants: none  Severe AS: s/p successful TAVR with a 26 mm Edwards Sapien 3 THV via the TF approach on 07/25/18. Post operative echo pending. Groin sites are stable. ECG with chronic afib and no high grade heart block. He has been started on ASA and we have recommended he  start Eliquis tomorrow morning. Plan for discharge home today and I will see him back in the clinic in 1 week.    Chronic atrial fibrillation: initially he had some bradycardia but now rates in low 100s. Home diltiazem resumed. He was not on an anticoagulation given history of a subdural hematoma after a traumatic fall in the past. He has a CHADSVASC score of  at least 3. There was also question of LAA thrombus on pre TAVR CT scan. We have recommended that he start Eliquis tomorrow (07/27/18) as the risk of stroke outweighs his risk of bleeding. This has been called into his pharmacy.   Acute on chronic diastolic CHF: he had an elevated BNP on pre admission labs and CHF on CXR. He was treated with IV lasix. His home Torsemide will be resumed today. He was educated on sliding scale diuretics for increased weight at home.   HTN: BP well controlled.    The patient has had an uncomplicated hospital course and is recovering well. The femoral catheter sites are stable. He has been seen by Dr. Clifton James today and deemed ready for discharge home. All follow-up appointments have been scheduled. Discharge medications are listed below.  _____________  Discharge Vitals Blood pressure (!) 148/69, pulse (!) 56, temperature (!) 97.3 F (36.3 C), temperature source Oral, resp. rate 17, height 6' (1.829 m), weight 111 kg, SpO2 97 %.  Filed Weights   07/25/18 0622 07/26/18 0409  Weight: 110.1 kg 111 kg   VS:  BP (!) 148/69 (BP Location: Right Arm)   Pulse (!) 56   Temp (!) 97.3 F (36.3 C) (Oral)   Resp 17   Ht 6' (1.829 m)   Wt 111 kg   SpO2 97%   BMI 33.19 kg/m    GEN: Well nourished, well developed, in no acute distress. Morbidly obese HEENT: normal  Neck: no JVD, carotid bruits, or masses Cardiac: irreg irreg; no murmurs, rubs, or gallops,no edema  Respiratory:  clear to auscultation bilaterally, normal work of breathing GI: soft, nontender, nondistended, + BS MS: no deformity or atrophy  Skin: warm and dry, no rash. Groin sites stable with no ecchymosis Neuro:  Alert and Oriented x 3, Strength and sensation are intact Psych: euthymic mood, full affect    Labs & Radiologic Studies     CBC Recent Labs    07/25/18 1018 07/26/18 0355  WBC  --  12.4*  HGB 12.6* 12.2*  HCT 37.0* 36.6*  MCV  --  73.6*  PLT  --  240   Basic Metabolic  Panel Recent Labs    07/25/18 1018 07/26/18 0355  NA 139 138  K 4.0 4.1  CL 104 105  CO2  --  26  GLUCOSE 122* 111*  BUN 13 15  CREATININE 0.80 1.02  CALCIUM  --  9.0  MG  --  1.9   Liver Function Tests No results for input(s): AST, ALT, ALKPHOS, BILITOT, PROT, ALBUMIN in the last 72 hours. No results for input(s): LIPASE, AMYLASE in the last 72 hours. Cardiac Enzymes No results for input(s): CKTOTAL, CKMB, CKMBINDEX, TROPONINI in the last 72 hours. BNP Invalid input(s): POCBNP D-Dimer No results for input(s): DDIMER in the last 72 hours. Hemoglobin A1C No results for input(s): HGBA1C in the last 72 hours. Fasting Lipid Panel No results for input(s): CHOL, HDL, LDLCALC, TRIG, CHOLHDL, LDLDIRECT in the last 72 hours. Thyroid Function Tests No results for input(s): TSH, T4TOTAL, T3FREE, THYROIDAB in the last 72 hours.  Invalid input(s): FREET3  Dg Orthopantogram  Result Date: 07/10/2018 CLINICAL DATA:  Right lower molar pain. EXAM: ORTHOPANTOGRAM/PANORAMIC COMPARISON:  None. FINDINGS: No fracture or dislocation is noted. Poor dentition is noted. Large defect is seen involving right-sided posterior molar in right mandible concerning for dental infection. IMPRESSION: Large defect seen involving right-sided posterior molar in right mandible concerning for dental infection. Electronically Signed   By: Lupita Raider, M.D.   On: 07/10/2018 12:33   Dg Chest 2 View  Result Date: 07/21/2018 CLINICAL DATA:  Pre-admission for heart surgery. EXAM: CHEST - 2 VIEW COMPARISON:  CT 07/11/2018.  Chest x-ray 16 2019. FINDINGS: Mediastinum and hilar structures normal. Cardiomegaly with mild pulmonary venous congestion. No focal infiltrate. No pleural effusion or pneumothorax. IMPRESSION: Cardiomegaly with mild pulmonary venous congestion. No acute pulmonary disease. Electronically Signed   By: Maisie Fus  Register   On: 07/21/2018 15:24   Ct Coronary Morph W/cta Cor Teressa Senter W/ca W/cm &/or  Wo/cm  Addendum Date: 07/11/2018   ADDENDUM REPORT: 07/11/2018 13:42 EXAM: OVER-READ INTERPRETATION  CT CHEST The following report is an over-read performed by radiologist Dr. Royal Piedra [facility] on 07/11/2018. This over-read does not include interpretation of cardiac or coronary anatomy or pathology. The coronary CTA interpretation by the cardiologist is attached. COMPARISON:  None. FINDINGS: Extracardiac findings will be described separately under dictation for contemporaneously obtained CTA chest, abdomen and pelvis. IMPRESSION: Please see separate dictation for contemporaneously obtained CTA chest, abdomen and pelvis dated 07/11/2018 for full description of extracardiac findings. Electronically Signed   By: Trudie Reed M.D.   On: 07/11/2018 13:42   Result Date: 07/11/2018 CLINICAL DATA:  Aortic Stenosis EXAM: Cardiac TAVR CT TECHNIQUE: The patient was scanned on a Siemens Force 192 slice scanner. A 120 kV retrospective scan was triggered in the ascending thoracic aorta at 140 HU's. Gantry rotation speed was 250 msecs and collimation was .6 mm. No beta blockade or nitro were given. The 3D data set was reconstructed in 5% intervals of the R-R cycle. Systolic and diastolic phases were analyzed on a dedicated work station using MPR, MIP and VRT modes. The patient received 80 cc of contrast. FINDINGS: Aortic Valve: Trileaflet severely calcified with restricted motion Aorta: Calcific atherosclerosis with no aneurysm Sino-tubular Junction: 25 mm Ascending Thoracic Aorta: 31 mm Aortic Arch: 29 mm Descending Thoracic Aorta: 25 mm Sinus of Valsalva Measurements: Non-coronary: 29.2 mm Right - coronary: 27.8 mm Left -   coronary: 29.4 mm Coronary Artery Height above Annulus: Left Main: 13 mm above annulus Right Coronary: 11.2 mm above annulus Virtual Basal Annulus Measurements: Maximum / Minimum Diameter: 23.5 mm x 26.5 mm Perimeter: 81.2 mm Area: 536 mm2 Coronary Arteries: Sufficient height  above annulus for deployment Optimum Fluoroscopic Angle for Delivery: LAO 13 degrees Caudal 4 degrees IMPRESSION: 1. Calcified tri leaflet AV with annular area of 536 mm2 suitable for a 26 mm Sapien 3 valve 2. Optimum angiographic angle for deployment LAO 13 degrees Caudal 4 degrees 3.  Coronary arteries sufficient height above annulus for deployment 4.  Severe bi atrial enlargement cannot r/o laminated LAA thrombus 5.  Normal aortic root 3.1 cm Charlton Haws Electronically Signed: By: Charlton Haws M.D. On: 07/11/2018 13:09   Dg Chest Port 1 View  Result Date: 07/25/2018 CLINICAL DATA:  Postprocedure EXAM: PORTABLE CHEST 1 VIEW COMPARISON:  Portable exam 1204 hours compared 07/21/2018 FINDINGS: New RIGHT jugular central venous catheter with tip projecting over proximal SVC. Enlargement of cardiac silhouette with pulmonary vascular  congestion. Atherosclerotic calcification aorta. Lungs clear. No pleural effusion or pneumothorax. IMPRESSION: No pneumothorax following RIGHT jugular line insertion. Enlargement of cardiac silhouette with pulmonary vascular congestion. Electronically Signed   By: Ulyses Southward M.D.   On: 07/25/2018 12:21   Dg Chest Port 1 View  Result Date: 07/07/2018 CLINICAL DATA:  Pulmonary edema. EXAM: PORTABLE CHEST 1 VIEW COMPARISON:  06/10/2018 FINDINGS: 0552 hours. The cardio pericardial silhouette is enlarged. Vascular congestion with basilar predominant interstitial opacity compatible with dependent interstitial edema. No substantial pleural effusion. Telemetry leads overlie the chest. IMPRESSION: Cardiomegaly with vascular congestion and likely interstitial edema. Electronically Signed   By: Kennith Center M.D.   On: 07/07/2018 08:56   Ct Angio Chest Aorta W/cm &/or Wo/cm  Result Date: 07/11/2018 CLINICAL DATA:  67 year old male with history of severe aortic stenosis. Preprocedural study prior to potential transcatheter aortic valve replacement (TAVR) procedure. EXAM: CT ANGIOGRAPHY  CHEST, ABDOMEN AND PELVIS TECHNIQUE: Multidetector CT imaging through the chest, abdomen and pelvis was performed using the standard protocol during bolus administration of intravenous contrast. Multiplanar reconstructed images and MIPs were obtained and reviewed to evaluate the vascular anatomy. CONTRAST:  ISOVUE-370 IOPAMIDOL (ISOVUE-370) INJECTION 76% COMPARISON:  No priors. FINDINGS: CTA CHEST FINDINGS Cardiovascular: Heart size is severely enlarged with severe left atrial dilatation. There is no significant pericardial fluid, thickening or pericardial calcification. There is aortic atherosclerosis, as well as atherosclerosis of the great vessels of the mediastinum and the coronary arteries, including calcified atherosclerotic plaque in the left main, left anterior descending, left circumflex and right coronary arteries. Severe calcifications of the aortic valve. Moderate calcifications of the mitral annulus. Dilatation of the pulmonic trunk (4.5 cm in diameter). Mediastinum/Lymph Nodes: No pathologically enlarged mediastinal or hilar lymph nodes. Esophagus is unremarkable in appearance. No axillary lymphadenopathy. Lungs/Pleura: Trace right pleural effusion lying dependently. Several small pulmonary nodules are noted in the right lung, largest of which is in the medial aspect of the right lower lobe measuring 5 mm (axial image 78 of series 15). No acute consolidative airspace disease. Musculoskeletal/Soft Tissues: There are no aggressive appearing lytic or blastic lesions noted in the visualized portions of the skeleton. CTA ABDOMEN AND PELVIS FINDINGS Hepatobiliary: Liver has a nodular contour, suggesting underlying cirrhosis. No suspicious cystic or solid hepatic lesions. No intra or extrahepatic biliary ductal dilatation. Gallbladder is moderately distended, but otherwise unremarkable in appearance. Pancreas: No pancreatic mass. No pancreatic or peripancreatic fluid or inflammatory changes. Spleen:  Unremarkable. Adrenals/Urinary Tract: 2 mm nonobstructive calculus in the interpolar collecting system of the right kidney. Extensive scarring in the right kidney. 3 mm nonobstructive calculus in the lower pole collecting system of the left kidney. Subcentimeter low-attenuation lesion in the interpolar region of the left kidney, too small to characterize, but statistically likely to represent a cyst. No hydroureteronephrosis. Urinary bladder is largely obscured by beam hardening artifact fact from the patient's right hip arthroplasty, but is unremarkable in appearance. Stomach/Bowel: status post Roux-en-Y gastric bypass. No pathologic dilatation of small bowel or colon. Numerous colonic diverticulae are noted, particularly in the descending colon and sigmoid colon, without surrounding inflammatory changes to suggest an acute diverticulitis at this time. Normal appendix. Vascular/Lymphatic: Aortic atherosclerosis, with vascular findings and measurements pertinent to potential TAVR procedure, as detailed below. Please note, that the extensive beam hardening artifact in the associated image noise does limit assessment of this examination (as detailed below). No lymphadenopathy identified in the abdomen or pelvis. Reproductive: Prostate gland and seminal vesicles are completely obscured  by beam hardening artifact. Other: No significant volume of ascites.  No pneumoperitoneum. Musculoskeletal: Status post right hip arthroplasty. There are no aggressive appearing lytic or blastic lesions noted in the visualized portions of the skeleton. VASCULAR MEASUREMENTS PERTINENT TO TAVR: AORTA: Minimal Aortic Diameter-19 x 17 mm Severity of Aortic Calcification-mild RIGHT PELVIS: Right Common Iliac Artery - Minimal Diameter-9.7 x 9.9 mm Tortuosity-mild Calcification - moderate Right External Iliac Artery - Comment: A portion of the distal vessel is completely obscurred by beam hardening artifact from patient's right hip  arthroplasty, and cannot be adequately evaluated. Minimal Diameter-8.3 x 7.3 mm Tortuosity-moderate Calcification - moderate Right Common Femoral Artery - Comment: A portion of the proximal vessel is completely obscurred by beam hardening artifact from patient's right hip arthroplasty, and cannot be adequately evaluated. Minimal Diameter-9.3 x 6.0 mm Tortuosity-mild Calcification - moderate LEFT PELVIS: Left Common Iliac Artery - Minimal Diameter-11.2 x 9.0 mm Tortuosity-mild Calcification - moderate Left External Iliac Artery - Minimal Diameter-8.7 x 7.2 mm Tortuosity-moderate Calcification - moderate Left Common Femoral Artery - Comment: Poorly evaluated secondary to extensive image noise. Minimal Diameter-8.7 x 6.3 mm Tortuosity-mild Calcification - moderate Review of the MIP images confirms the above findings. IMPRESSION: 1. Vascular findings and measurements pertinent to potential TAVR procedure, as detailed above. Please note that portions of today's examination were severely limited by beam hardening artifact from the patient's right hip arthroplasty, as indicated above. 2. Severe thickening calcification of the aortic valve, compatible with the reported clinical history of severe aortic stenosis. 3. Moderate calcification of the mitral annulus. 4. Cardiomegaly with severe left atrial dilatation. 5. Aortic atherosclerosis, in addition to left main and 3 vessel coronary artery disease. Please note that although the presence of coronary artery calcium documents the presence of coronary artery disease, the severity of this disease and any potential stenosis cannot be assessed on this non-gated CT examination. Assessment for potential risk factor modification, dietary therapy or pharmacologic therapy may be warranted, if clinically indicated. 6. Severe dilatation of the pulmonic trunk (4.5 cm in diameter), strongly suggestive of pulmonary arterial hypertension. 7. Trace right pleural effusion lying dependently.  8. Multiple small pulmonary nodules in the right lung measuring 5 mm or less in size. These are nonspecific, but are statistically likely benign. No follow-up needed if patient is low-risk (and has no known or suspected primary neoplasm). Non-contrast chest CT can be considered in 12 months if patient is high-risk. This recommendation follows the consensus statement: Guidelines for Management of Incidental Pulmonary Nodules Detected on CT Images: From the Fleischner Society 2017; Radiology 2017; 284:228-243. 9. Small nonobstructive calculi in the collecting systems of both kidneys. 10. Additional incidental findings, as above. Electronically Signed   By: Trudie Reed M.D.   On: 07/11/2018 14:48   Ct Angio Abd/pel W/ And/or W/o  Result Date: 07/11/2018 CLINICAL DATA:  67 year old male with history of severe aortic stenosis. Preprocedural study prior to potential transcatheter aortic valve replacement (TAVR) procedure. EXAM: CT ANGIOGRAPHY CHEST, ABDOMEN AND PELVIS TECHNIQUE: Multidetector CT imaging through the chest, abdomen and pelvis was performed using the standard protocol during bolus administration of intravenous contrast. Multiplanar reconstructed images and MIPs were obtained and reviewed to evaluate the vascular anatomy. CONTRAST:  ISOVUE-370 IOPAMIDOL (ISOVUE-370) INJECTION 76% COMPARISON:  No priors. FINDINGS: CTA CHEST FINDINGS Cardiovascular: Heart size is severely enlarged with severe left atrial dilatation. There is no significant pericardial fluid, thickening or pericardial calcification. There is aortic atherosclerosis, as well as atherosclerosis of the great vessels of  the mediastinum and the coronary arteries, including calcified atherosclerotic plaque in the left main, left anterior descending, left circumflex and right coronary arteries. Severe calcifications of the aortic valve. Moderate calcifications of the mitral annulus. Dilatation of the pulmonic trunk (4.5 cm in diameter).  Mediastinum/Lymph Nodes: No pathologically enlarged mediastinal or hilar lymph nodes. Esophagus is unremarkable in appearance. No axillary lymphadenopathy. Lungs/Pleura: Trace right pleural effusion lying dependently. Several small pulmonary nodules are noted in the right lung, largest of which is in the medial aspect of the right lower lobe measuring 5 mm (axial image 78 of series 15). No acute consolidative airspace disease. Musculoskeletal/Soft Tissues: There are no aggressive appearing lytic or blastic lesions noted in the visualized portions of the skeleton. CTA ABDOMEN AND PELVIS FINDINGS Hepatobiliary: Liver has a nodular contour, suggesting underlying cirrhosis. No suspicious cystic or solid hepatic lesions. No intra or extrahepatic biliary ductal dilatation. Gallbladder is moderately distended, but otherwise unremarkable in appearance. Pancreas: No pancreatic mass. No pancreatic or peripancreatic fluid or inflammatory changes. Spleen: Unremarkable. Adrenals/Urinary Tract: 2 mm nonobstructive calculus in the interpolar collecting system of the right kidney. Extensive scarring in the right kidney. 3 mm nonobstructive calculus in the lower pole collecting system of the left kidney. Subcentimeter low-attenuation lesion in the interpolar region of the left kidney, too small to characterize, but statistically likely to represent a cyst. No hydroureteronephrosis. Urinary bladder is largely obscured by beam hardening artifact fact from the patient's right hip arthroplasty, but is unremarkable in appearance. Stomach/Bowel: status post Roux-en-Y gastric bypass. No pathologic dilatation of small bowel or colon. Numerous colonic diverticulae are noted, particularly in the descending colon and sigmoid colon, without surrounding inflammatory changes to suggest an acute diverticulitis at this time. Normal appendix. Vascular/Lymphatic: Aortic atherosclerosis, with vascular findings and measurements pertinent to potential  TAVR procedure, as detailed below. Please note, that the extensive beam hardening artifact in the associated image noise does limit assessment of this examination (as detailed below). No lymphadenopathy identified in the abdomen or pelvis. Reproductive: Prostate gland and seminal vesicles are completely obscured by beam hardening artifact. Other: No significant volume of ascites.  No pneumoperitoneum. Musculoskeletal: Status post right hip arthroplasty. There are no aggressive appearing lytic or blastic lesions noted in the visualized portions of the skeleton. VASCULAR MEASUREMENTS PERTINENT TO TAVR: AORTA: Minimal Aortic Diameter-19 x 17 mm Severity of Aortic Calcification-mild RIGHT PELVIS: Right Common Iliac Artery - Minimal Diameter-9.7 x 9.9 mm Tortuosity-mild Calcification - moderate Right External Iliac Artery - Comment: A portion of the distal vessel is completely obscurred by beam hardening artifact from patient's right hip arthroplasty, and cannot be adequately evaluated. Minimal Diameter-8.3 x 7.3 mm Tortuosity-moderate Calcification - moderate Right Common Femoral Artery - Comment: A portion of the proximal vessel is completely obscurred by beam hardening artifact from patient's right hip arthroplasty, and cannot be adequately evaluated. Minimal Diameter-9.3 x 6.0 mm Tortuosity-mild Calcification - moderate LEFT PELVIS: Left Common Iliac Artery - Minimal Diameter-11.2 x 9.0 mm Tortuosity-mild Calcification - moderate Left External Iliac Artery - Minimal Diameter-8.7 x 7.2 mm Tortuosity-moderate Calcification - moderate Left Common Femoral Artery - Comment: Poorly evaluated secondary to extensive image noise. Minimal Diameter-8.7 x 6.3 mm Tortuosity-mild Calcification - moderate Review of the MIP images confirms the above findings. IMPRESSION: 1. Vascular findings and measurements pertinent to potential TAVR procedure, as detailed above. Please note that portions of today's examination were severely  limited by beam hardening artifact from the patient's right hip arthroplasty, as indicated above. 2. Severe thickening  calcification of the aortic valve, compatible with the reported clinical history of severe aortic stenosis. 3. Moderate calcification of the mitral annulus. 4. Cardiomegaly with severe left atrial dilatation. 5. Aortic atherosclerosis, in addition to left main and 3 vessel coronary artery disease. Please note that although the presence of coronary artery calcium documents the presence of coronary artery disease, the severity of this disease and any potential stenosis cannot be assessed on this non-gated CT examination. Assessment for potential risk factor modification, dietary therapy or pharmacologic therapy may be warranted, if clinically indicated. 6. Severe dilatation of the pulmonic trunk (4.5 cm in diameter), strongly suggestive of pulmonary arterial hypertension. 7. Trace right pleural effusion lying dependently. 8. Multiple small pulmonary nodules in the right lung measuring 5 mm or less in size. These are nonspecific, but are statistically likely benign. No follow-up needed if patient is low-risk (and has no known or suspected primary neoplasm). Non-contrast chest CT can be considered in 12 months if patient is high-risk. This recommendation follows the consensus statement: Guidelines for Management of Incidental Pulmonary Nodules Detected on CT Images: From the Fleischner Society 2017; Radiology 2017; 284:228-243. 9. Small nonobstructive calculi in the collecting systems of both kidneys. 10. Additional incidental findings, as above. Electronically Signed   By: Trudie Reed M.D.   On: 07/11/2018 14:48     Diagnostic Studies/Procedures     TAVR OPERATIVE NOTE   Date of Procedure:                07/25/2018  Preoperative Diagnosis:      Severe Aortic Stenosis  Procedure:        Transcatheter Aortic Valve Replacement - Percutaneous Left Transfemoral Approach              Edwards Sapien 3 THV (size 26 mm, model # 9600TFX, serial # [id number])              Co-Surgeons:                        Alleen Borne, MD and Verne Carrow, MD  Pre-operative Echo Findings: ? Severe aortic stenosis ? Normal left ventricular systolic function  Post-operative Echo Findings: ? No paravalvular leak ? Normal left ventricular systolic function _____________  2D ECHO 07/26/18: formal read pending at the time of discharge   Disposition   Pt is being discharged home today in good condition.  Follow-up Plans & Appointments    Follow-up Information    Janetta Hora, PA-C. Go on 08/03/2018.   Specialties:  Cardiology, Radiology Why:  @ 1:30pm  Contact information: [address] [name] [address] [phone]            Discharge Medications     Medication List    TAKE these medications   acetaminophen 650 MG CR tablet Commonly known as:  TYLENOL Take 650 mg by mouth 2 (two) times daily as needed for pain.   apixaban 5 MG Tabs tablet Commonly known as:  ELIQUIS Take 1 tablet (5 mg total) by mouth 2 (two) times daily. Start taking on:  07/27/2018   aspirin 81 MG chewable tablet Chew 1 tablet (81 mg total) by mouth daily. Start taking on:  07/27/2018   atorvastatin 80 MG tablet Commonly known as:  LIPITOR TAKE ONE TABLET BY MOUTH ONCE DAILY WITH BREAKFAST What changed:  See the new instructions.   diltiazem 120 MG 24 hr capsule Commonly known as:  CARDIZEM CD Take  1 capsule (120 mg total) by mouth daily.   ferrous sulfate 325 (65 FE) MG tablet Take 1 tablet (325 mg total) by mouth 3 (three) times daily. What changed:  when to take this   spironolactone 25 MG tablet Commonly known as:  ALDACTONE Take 0.5 tablets (12.5 mg total) by mouth daily.   torsemide 20 MG tablet Commonly known as:  DEMADEX Take 2 tablets (40 mg total) by mouth daily. What changed:    when to take this  reasons to take this  additional  instructions   Vitamin D (Ergocalciferol) 50000 units Caps capsule Commonly known as:  DRISDOL TAKE ONE CAPSULE BY MOUTH EVERY 7 DAYS         Outstanding Labs/Studies   none  Duration of Discharge Encounter   Greater than 30 minutes including physician time.  Signed, Cline Crock PA-C 07/26/2018, 10:17 AM   I have personally seen and examined this patient. I agree with the assessment and plan as outlined above.  He is doing well this am. Echo today to assess valve. One day post TAVR. Will start Eliquis tomorrow. Continue ASA. Discharge home today.   Verne Carrow 07/26/2018 11:39 AM

## 2018-07-27 ENCOUNTER — Telehealth: Payer: Self-pay | Admitting: *Deleted

## 2018-07-27 ENCOUNTER — Other Ambulatory Visit: Payer: Self-pay

## 2018-07-27 ENCOUNTER — Telehealth: Payer: Self-pay | Admitting: Physician Assistant

## 2018-07-27 NOTE — Telephone Encounter (Signed)
  [facility]   MULTIDISCIPLINARY HEART VALVE TEAM   Patient contacted regarding discharge from [facility] on 07/26/18  Patient understands to follow up with provider Carlean Jews on 9/12 at [facility].  Patient understands discharge instructions? yes Patient understands medications and regiment? yes Patient understands to bring all medications to this visit? yes  Cline Crock PA-C  [facility]

## 2018-07-27 NOTE — Telephone Encounter (Signed)
Left message for pt to call, patient dropped off insurance papers to the office. He was admitted yesterday for TAVR. Need to find out if these are for the august admission or the current procedure.

## 2018-07-27 NOTE — Telephone Encounter (Signed)
New Message         Patient took bandage off (per the nurse) he can not stop the bleeding, he has galls on it now and it's still bleeding. Pls advise

## 2018-07-27 NOTE — Patient Outreach (Signed)
[facility]) Care Management  07/27/2018  Joel Rogers [birth year] [id number]   EMMI- Heart Failure RED ON EMMI ALERT Day # 9 Date: 07/26/18 Red Alert Reason:  Weighed themselves today? No   Weighed themselves today? No    Outreach attempt: no answer.  HIPAA compliant voice message left.     Plan: RN CM will send letter and attempt patient again within 4 business days.    [name], RN, MSN [facility] Care Management Care Management Coordinator Direct Line [phone] Toll Free: [phone]  Fax: [phone]

## 2018-07-27 NOTE — Telephone Encounter (Signed)
Follow Up:; ° ° °Returning your call. °

## 2018-07-27 NOTE — Telephone Encounter (Signed)
Spoke with patient who called to inform us that he removed a band aid from his neck and it started bleeding.  He was discharged from the hospital on 9/4 and was told to remove it today 9/5 after 3pm.    I told him to apply pressure on the site to see if he can contain the bleeding and redress the area.  He will go to [facility] if the bleeding persists.  I also told him to call us with an update later today.

## 2018-07-27 NOTE — Telephone Encounter (Signed)
Spoke with pt, the paperwork is for both the august and the TAVR admissions. He has a follow up with the TAVR clinic 08-03-18, he is going to come by and pick up this paperwork to take to that appointment. paperwork placed at the front desk for patient pick up.

## 2018-07-28 ENCOUNTER — Telehealth: Payer: Self-pay | Admitting: Physician Assistant

## 2018-07-28 ENCOUNTER — Telehealth: Payer: Self-pay

## 2018-07-28 MED FILL — Heparin Sodium (Porcine) Inj 1000 Unit/ML: INTRAMUSCULAR | Qty: 30 | Status: AC

## 2018-07-28 MED FILL — Sodium Chloride IV Soln 0.9%: INTRAVENOUS | Qty: 250 | Status: AC

## 2018-07-28 MED FILL — Phenylephrine HCl IV Soln 10 MG/ML: INTRAVENOUS | Qty: 2 | Status: AC

## 2018-07-28 MED FILL — Potassium Chloride Inj 2 mEq/ML: INTRAVENOUS | Qty: 40 | Status: AC

## 2018-07-28 MED FILL — Magnesium Sulfate Inj 50%: INTRAMUSCULAR | Qty: 10 | Status: AC

## 2018-07-28 NOTE — Telephone Encounter (Signed)
Attempted to reach the pt by phone but no answer at this time.  

## 2018-07-28 NOTE — Telephone Encounter (Signed)
Followed up with the patient, who said that he had gotten the bleeding to stop at the site of entry in the neck.  He was appreciative for the call.

## 2018-07-28 NOTE — Telephone Encounter (Signed)
New Message         Patient called states that the iron medication that he was prescribed (Ferrous sulfate) has him constipated. He has not had a bowel movement in 5 days, pls advise.

## 2018-07-31 ENCOUNTER — Encounter (HOSPITAL_COMMUNITY): Payer: Self-pay | Admitting: Dentistry

## 2018-07-31 ENCOUNTER — Ambulatory Visit (HOSPITAL_COMMUNITY): Payer: Self-pay | Admitting: Dentistry

## 2018-07-31 VITALS — BP 140/82 | HR 73 | Temp 97.8°F

## 2018-07-31 DIAGNOSIS — M264 Malocclusion, unspecified: Secondary | ICD-10-CM

## 2018-07-31 DIAGNOSIS — K08409 Partial loss of teeth, unspecified cause, unspecified class: Secondary | ICD-10-CM

## 2018-07-31 DIAGNOSIS — Z952 Presence of prosthetic heart valve: Secondary | ICD-10-CM

## 2018-07-31 DIAGNOSIS — K031 Abrasion of teeth: Secondary | ICD-10-CM

## 2018-07-31 DIAGNOSIS — K08199 Complete loss of teeth due to other specified cause, unspecified class: Secondary | ICD-10-CM

## 2018-07-31 NOTE — Telephone Encounter (Signed)
Left message on machine for pt to contact the office.   

## 2018-07-31 NOTE — Patient Instructions (Signed)
PLAN: 1. Continue salt water rinses as needed to aid healing. 2. Brush teeth after meals and at bedtime. Floss at bedtime.  3. Patient to follow-up with a dentist of his choice for evaluation for comprehensive dental care after evaluation for the crown and bridge therapy he desires at this time. The patient was given the name of several dentists that he could consider for his treatment. The patient was not referred at this time. No release of information was obtained at this time. 4. Patient was reminded that he will need antibiotic premedication prior to invasive dental procedures after the aortic valve replacement as per American Heart Association guidelines.   Charlynne Pander, DDS

## 2018-07-31 NOTE — Progress Notes (Addendum)
POST OPERATIVE NOTE:  07/31/2018 [name] [id number]  VITALS: BP 140/82 (BP Location: Right Arm)   Pulse 73   Temp 97.8 F (36.6 C)   LABS:  Lab Results  Component Value Date   WBC 12.4 (H) 07/26/2018   HGB 12.2 (L) 07/26/2018   HCT 36.6 (L) 07/26/2018   MCV 73.6 (L) 07/26/2018   PLT 240 07/26/2018   BMET    Component Value Date/Time   NA 138 07/26/2018 0355   NA 142 06/27/2018 1050   K 4.1 07/26/2018 0355   CL 105 07/26/2018 0355   CO2 26 07/26/2018 0355   GLUCOSE 111 (H) 07/26/2018 0355   BUN 15 07/26/2018 0355   BUN 14 06/27/2018 1050   CREATININE 1.02 07/26/2018 0355   CREATININE 1.04 05/01/2018 0913   CALCIUM 9.0 07/26/2018 0355   GFRNONAA >60 07/26/2018 0355   GFRNONAA 74 05/01/2018 0913   GFRAA >60 07/26/2018 0355   GFRAA 86 05/01/2018 0913    Lab Results  Component Value Date   INR 1.12 07/21/2018   INR 1.25 07/09/2018   INR 1.07 08/27/2016   No results found for: PTT   [name] is status post multiple extractions with alveoloplasty and gross debridement remaining dentition in the operating room with monitored anesthesia care.  SUBJECTIVE: Patient denies having any dental pain at this time. Patient does not think he has any stitches that remain. Patient is interested in having crown and bridge therapy to replace the missing teeth.  The patient was given the Gold bridge from tooth numbers 8 through 10 that was extracted on 8/22. This bridge was packaged and sterilized and the dental medicine clinic prior to being given back to the patient at his request.  EXAM: There is no sign of infection, heme, or ooze. No sutures remain. Patient is healing in by generalized primary closure. The patient has multiple missing teeth. The patient has multiple flexure lesions. The patient has a class III malocclusion.  PROCEDURE: The patient was given a chlorhexidine gluconate rinse for 30 seconds. The patient was examined but no sutures were present  removed at this time..  ASSESSMENT: Post operative course is consistent with dental procedures performed in the operating room. Loss of teeth due to extraction Multiple flexure lesions Multiple missing teeth Class III malocclusion  PLAN: 1. Continue salt water rinses as needed to aid healing. 2. Brush teeth after meals and at bedtime. Floss at bedtime.  3. Patient to follow-up with a dentist of his choice for evaluation for comprehensive dental care after evaluation for the crown and bridge therapy he desires at this time. The patient was given the name of several dentists that he could consider for his treatment. The patient was not referred at this time. No release of information was obtained at this time. 4. Patient was reminded that he will need antibiotic premedication prior to invasive dental procedures after the aortic valve replacement as per American Heart Association guidelines.   Charlynne Pander, DDS

## 2018-08-01 ENCOUNTER — Other Ambulatory Visit: Payer: Self-pay

## 2018-08-01 NOTE — Patient Outreach (Signed)
[facility]) Care Management  08/01/2018  Joel Rogers 1951-06-10 [id number]   EMMI- Heart Failure RED ON EMMI ALERT Day # 9 Date:07/26/18 Red Alert Reason: Weighed themselves today? No  Weight (lbs)    Know exactly which meds to take? No   Outreach to patient. He is able to verify HIPAA.  Discussed red alert.  Patient reports that he is weighing daily and knows which medications to take.  He states that he is also watching his salt intake and knows to contact the physician.  Patient states he has a follow up appointment this week with physician and has transportation.  Patient also states he is taking his medications as prescribed.  Patient states that he is exercising regularly as well.    Discussed [facility] services with patient.  He declines any needs presently.    Plan: RN CM will close case.  [name], RN, MSN [facility] Care Management Care Management Coordinator Direct Line [phone] Cell [phone] Toll Free: [phone]  Fax: [phone]

## 2018-08-01 NOTE — Telephone Encounter (Signed)
I spoke with the pt and his bowels have moved.  The pt takes iron 3 times per day and wants to decrease his dosage.  I advised him that iron was prescribed by PCP and they will need to address dosage for this medication. The pt will increase dietary fiber. Pt agreed with plan.

## 2018-08-02 ENCOUNTER — Ambulatory Visit: Payer: Self-pay

## 2018-08-03 ENCOUNTER — Ambulatory Visit (INDEPENDENT_AMBULATORY_CARE_PROVIDER_SITE_OTHER): Payer: Medicare Other | Admitting: Physician Assistant

## 2018-08-03 ENCOUNTER — Encounter: Payer: Self-pay | Admitting: Physician Assistant

## 2018-08-03 VITALS — BP 126/62 | HR 67 | Ht 72.0 in | Wt 235.0 lb

## 2018-08-03 DIAGNOSIS — I482 Chronic atrial fibrillation, unspecified: Secondary | ICD-10-CM

## 2018-08-03 DIAGNOSIS — I5032 Chronic diastolic (congestive) heart failure: Secondary | ICD-10-CM | POA: Diagnosis not present

## 2018-08-03 DIAGNOSIS — I10 Essential (primary) hypertension: Secondary | ICD-10-CM

## 2018-08-03 DIAGNOSIS — Z952 Presence of prosthetic heart valve: Secondary | ICD-10-CM

## 2018-08-03 MED ORDER — AMOXICILLIN 500 MG PO TABS: ORAL_TABLET | ORAL | 3 refills | Status: DC

## 2018-08-03 NOTE — Patient Instructions (Signed)
Medication Instructions:  Your physician has recommended you make the following change in your medication: Stop taking Eliquis until you are seen in our office on September 16    Labwork: Lab work to be done today--BMP  Testing/Procedures: none  Follow-Up: Come to our office at 10:00 on September 16,2019 to have leg checked.   Any Other Special Instructions Will Be Listed Below (If Applicable).  Your physician discussed the importance of taking an antibiotic prior to any dental, gastrointestinal, genitourinary procedures to prevent damage to the heart valves from infection. A prescription was sent to your pharmacy.        If you need a refill on your cardiac medications before your next appointment, please call your pharmacy.

## 2018-08-03 NOTE — Progress Notes (Signed)
[facility]ART [facility]ASC[facility]ENTER   MULTIDISCIPLINARY HEART VALVE CLINIC                                       Cardiology Office Note    Date:  08/03/2018   ID:  Joel Leachalph L Iannone, DOB [birth year], MRN [id number]  PCP:  Dorena Bodoixon, [name], PA-C  Cardiologist: Dr. Allyson SabalBerry / Dr. Clifton JamesMcAlhany & Dr. Cornelius Moraswen (TAVR)  [facility]   History of Present Illness:  Joel LeachRalph L Harsha is a 67 y.o. male with a history of HTN, HLD, DMT2, chronic afib on Eliquis, morbid obesity s/p gastric bypass, OSA on CPAP, pulmonary HTN, chronic diastolic CHF, subdural hematoma after traumatic fall, and severe AS s/p TAVR (07/25/18) who presents to clinic for follow up.   He has been followed for aortic stenosis for several years. An echocardiogram on 02/09/2018 showed severe aortic stenosis with a mean gradient of 40 mmHg and a peak gradient of 76 mmHg. The dimensionless index was 0.28. Left ventricular ejection fraction was 60 to 65% with grade 3 diastolic dysfunction. He was seen by Dr. Clifton JamesMcAlhany in clinic for consideration of transcatheter aortic valve replacement and was scheduled for right and left heart catheterization on 07/06/2018. Catheterization showed severe elevation of his right heart pressures with a mean right atrial pressure of 26 mmHg and RV end-diastolic pressure of 24 mmHg. The case was aborted after the right heart cath due to respiratory distress during the procedure. With his severe pulmonary hypertension documented by previous echocardiogram he was admitted to the advanced heart failure team for optimization and diuresis.The patient reported worsening dyspnea on exertion and lower extremity edema prior to admission. A repeat echocardiogram on 07/07/2018 showed progression of his aortic stenosis with a rise in the mean gradient to 57 mmHg and a peak gradient of 100 mmHg. The right ventricle was mildly dilated. Left ventricular ejection fraction was 65 to 70%. He subsequently underwent repeat right and left heart cath on 07/10/2018.  This showed normal coronary arteries. There is severe pulmonary hypertension with a PA pressure of 91/34 and a wedge pressure of 28. Cardiac index was 4.4. Pulmonary vascular resistance was 2.5 Wood units. PA sat was 79% with a peripheral side of 99%. Right atrial pressure was 17. The LVEDP was 30. Optimization with diuresis was continued by the heart failure team. Due to bad teeth he was evaluated by dental medicine and underwent extraction of multiple teeth.  He underwent successful TAVR with a 26 mm Edwards Sapien 3 THV via the TF approach on 07/25/18. Post operative echo showed normally functioning TAVR with mean gradient of 12 mm Hg and no PVL. He was treated with IV lasix for volume overload and then restarted on home torsemide. He was discharged on ASA 81mg  daily and started on Eliquis given afib and CHADSVASC score of 3 and possible LAA thrombus on pre TAVR CT scan.    Today he presents to clinic for follow up. Doing great. Feeling the best he has felt in 7 years. He has been walking about 1 quarter mile everyday and feeling fine doing this. He no longer has to sit down to rest. Before he had to stop to rest just walking out to the car. No chest pain. No LE edema, orthopnea or PND. He is so used to propping up now that he doesn't like to lay flat. He has had some oozing from  his left groin site and central line site. The spot on his neck has stopped but has continued oozing from groin.   Past Medical History:  Diagnosis Date  . Anemia    low iron  . Arthritis    bilateral knees  . Atrial fibrillation, chronic (HCC)   . Chronic diastolic CHF (congestive heart failure) (HCC) 06/15/2018  . History of subdural hematoma   . Hyperlipidemia   . Hypertension   . Morbid obesity (HCC)   . Normal coronary arteries    by cardiac catheterization performed 03/14/06  . Obesity   . Pre-diabetes    pt denies  . S/P TAVR (transcatheter aortic valve replacement)    a. 07/25/18: Edwards Sapien 3 THV (size  26 mm, model # 9600TFX, serial # [id number]) by Dr. Laneta Simmers and Dr. Clifton James  . Sleep apnea    on CPAP  . Venous insufficiency     Past Surgical History:  Procedure Laterality Date  . CRANIOTOMY Right 08/27/2016   Procedure: CRANIOTOMY HEMATOMA EVACUATION SUBDURAL;  Surgeon: Maeola Harman, MD;  Location: [facility] OR;  Service: Neurosurgery;  Laterality: Right;  . GASTRIC BYPASS  09/2008  . JOINT REPLACEMENT  08/31/2006   right hip  . MULTIPLE EXTRACTIONS WITH ALVEOLOPLASTY N/A 07/13/2018   Procedure: Extraction of tooth #'s 3,8,10, 23-26, 30and 32 with alveoloplasty and gross debridement of remaining teeth;  Surgeon: Charlynne Pander, DDS;  Location: [facility] OR;  Service: Oral Surgery;  Laterality: N/A;  . RIGHT HEART CATH N/A 07/06/2018   Procedure: RIGHT HEART CATH;  Surgeon: Kathleene Hazel, MD;  Location: [facility] INVASIVE CV LAB;  Service: Cardiovascular;  Laterality: N/A;  . RIGHT/LEFT HEART CATH AND CORONARY ANGIOGRAPHY N/A 07/10/2018   Procedure: RIGHT/LEFT HEART CATH AND CORONARY ANGIOGRAPHY;  Surgeon: Dolores Patty, MD;  Location: [facility] INVASIVE CV LAB;  Service: Cardiovascular;  Laterality: N/A;  . TEE WITHOUT CARDIOVERSION N/A 07/25/2018   Procedure: TRANSESOPHAGEAL ECHOCARDIOGRAM (TEE);  Surgeon: Kathleene Hazel, MD;  Location: [facility] OR;  Service: Open Heart Surgery;  Laterality: N/A;  . TOTAL HIP ARTHROPLASTY     right hip  . TRANSCATHETER AORTIC VALVE REPLACEMENT, TRANSFEMORAL  07/25/2018  . TRANSCATHETER AORTIC VALVE REPLACEMENT, TRANSFEMORAL N/A 07/25/2018   Procedure: TRANSCATHETER AORTIC VALVE REPLACEMENT, TRANSFEMORAL;  Surgeon: Kathleene Hazel, MD;  Location: [facility] OR;  Service: Open Heart Surgery;  Laterality: N/A;    Current Medications: Outpatient Medications Prior to Visit  Medication Sig Dispense Refill  . acetaminophen (TYLENOL) 650 MG CR tablet Take 650 mg by mouth 2 (two) times daily as needed for pain.    Marland Kitchen apixaban (ELIQUIS) 5 MG TABS tablet Take 1 tablet (5 mg  total) by mouth 2 (two) times daily. 180 tablet 1  . aspirin 81 MG chewable tablet Chew 1 tablet (81 mg total) by mouth daily.    Marland Kitchen atorvastatin (LIPITOR) 80 MG tablet TAKE ONE TABLET BY MOUTH ONCE DAILY WITH BREAKFAST 90 tablet 1  . diltiazem (CARDIZEM CD) 120 MG 24 hr capsule Take 1 capsule (120 mg total) by mouth daily. 90 capsule 01  . spironolactone (ALDACTONE) 25 MG tablet Take 0.5 tablets (12.5 mg total) by mouth daily. 30 tablet 5  . torsemide (DEMADEX) 20 MG tablet Take 2 tablets (40 mg total) by mouth daily. (Patient taking differently: Take 40 mg by mouth as needed (if over 245 pounds). ) 60 tablet 11  . Vitamin D, Ergocalciferol, (DRISDOL) 50000 units CAPS capsule TAKE ONE CAPSULE BY MOUTH EVERY 7 DAYS 30 capsule  1  . ferrous sulfate 325 (65 FE) MG tablet Take 1 tablet (325 mg total) by mouth 3 (three) times daily. (Patient not taking: Reported on 08/03/2018) 90 tablet 11   No facility-administered medications prior to visit.      Allergies:   Patient has no known allergies.   Social History   Socioeconomic History  . Marital status: Married    Spouse name: [name]  . Number of children: 2  . Years of education: 11  . Highest education level: Not on file  Occupational History  . 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 Financial resource strain: Not on file  . Food insecurity:    Worry: Not on file    Inability: Not on file  . Transportation needs:    Medical: Not on file    Non-medical: Not on file  Tobacco Use  . Smoking status: Former Games developer  . Smokeless tobacco: Never Used  Substance and Sexual Activity  . Alcohol use: No  . Drug use: No  . Sexual activity: Not Currently  Lifestyle  . Physical activity:    Days per week: Not on file    Minutes per session: Not on file  . Stress: Not on file  Relationships  . Social connections:    Talks on phone: Not on file    Gets together: Not on file     Attends religious service: Not on file    Active member of club or organization: Not on file    Attends meetings of clubs or organizations: Not on file    Relationship status: Not on file  Other Topics Concern  . 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      ROS:   Please see the history of present illness.    ROS All other systems reviewed and are negative.   PHYSICAL EXAM:   VS:  BP 126/62   Pulse 67   Ht 6' (1.829 m)   Wt 235 lb (106.6 kg)   SpO2 95%   BMI 31.87 kg/m    GEN: Well nourished, well developed, in no acute distress obese HEENT: normal  Neck: no JVD, carotid bruits, or masses Cardiac: irreg irreg; soft flow murmur. No rubs, or gallops,no edema, wearing compression stockings.  Respiratory:  clear to auscultation bilaterally, normal work of breathing GI: soft, nontender, nondistended, + BS MS: no deformity or atrophy  Skin: warm and dry, no rash. Left groin site with persistent oozing of bright red blood Neuro:  Alert and Oriented x 3, Strength and sensation are intact Psych: euthymic mood, full affect   Wt Readings from Last 3 Encounters:  08/03/18 235 lb (106.6 kg)  07/26/18 244 lb 11.2 oz (111 kg)  07/21/18 242 lb 12.8 oz (110.1 kg)      Studies/Labs Reviewed:   EKG:  EKG is ordered today.  The ekg ordered today demonstrates atrial fibrillation HR 67  Recent Labs: 07/21/2018: ALT 20; B Natriuretic Peptide 376.1 07/26/2018: BUN 15; Creatinine, Ser 1.02; Hemoglobin 12.2; Magnesium 1.9; Platelets 240; Potassium 4.1; Sodium  138   Lipid Panel    Component Value Date/Time   CHOL 122 05/01/2018  0913   TRIG 41 05/01/2018 0913   HDL 40 (L) 05/01/2018 0913   CHOLHDL 3.1 05/01/2018 0913   VLDL 9 11/26/2016 1406   LDLCALC 70 05/01/2018 0913    Additional studies/ records that were reviewed today include:  TAVR OPERATIVE NOTE   Date of Procedure:07/25/2018  Preoperative Diagnosis:Severe Aortic Stenosis  Procedure:   Transcatheter Aortic Valve Replacement - PercutaneousLeftTransfemoral Approach Edwards Sapien 3 THV (size 26mm, model # 9600TFX, serial # [id number])  Co-Surgeons:Bryan Jennefer Bravo, MD and Verne Carrow, MD  Pre-operative Echo Findings: ? Severe aortic stenosis ? Normalleft ventricular systolic function  Post-operative Echo Findings: ? Noparavalvular leak ? Normalleft ventricular systolic function _____________  2D ECHO 07/26/18 Study Conclusions - Left ventricle: The cavity size was normal. Wall thickness was   increased in a pattern of moderate LVH. Systolic function was   normal. The estimated ejection fraction was in the range of 60%   to 65%. Wall motion was normal; there were no regional wall   motion abnormalities. - Aortic valve: A bioprosthesis was present. Valve area (VTI): 1.87   cm^2. Valve area (Vmax): 1.84 cm^2. Valve area (Vmean): 1.94   cm^2. - Left atrium: The atrium was massively dilated. - Right atrium: The atrium was severely dilated. - Pulmonary arteries: Systolic pressure was moderately to severely   increased. PA peak pressure: 69 mm Hg (S).   ASSESSMENT & PLAN:   Severe AS s/p TAVR:doing excellent after TAVR. He reports feeling the best he has in over 7years. He is on ASA 81mg  daily and Eliquis 5mg  BID. He has had persistent oozing from his groin site that is soaking his bandages. Dr. Excell Seltzer looked at his groin today and we decided to hold his Eliquis and will bring him back for a groin check on Monday. If oozing has stopped we will resume his Eliquis    Chronic atrial fibrillation: rate well controlled on cardizem CD. Hold Eliquis as above   Chronic diastolic CHF: appears euvolemic. Continue Torsemide 20mg  daily and spiro 12.5mg  daily. BMET today  HTN: BP well controlled    Morbid obesity: Body mass index is 31.87 kg/m.  Pulmonary nodules: noted on pre TAVR CT scans. Patient is a lifetime non smoker with no significant exposure to second hand smoke even though he did work in a cigarette factory. Radiology recommended 12 month CT follow up if he is high risk. We dicussed this today. I do not believe he is high risk and have recommended no further testing.   Medication Adjustments/Labs and Tests Ordered: Current medicines are reviewed at length with the patient today.  Concerns regarding medicines are outlined above.  Medication changes, Labs and Tests ordered today are listed in the Patient Instructions below. Patient Instructions  Medication Instructions:  Your physician has recommended you make the following change in your medication: Stop taking Eliquis until you are seen in our office on September 16    Labwork: Lab work to be done today--BMP  Testing/Procedures: none  Follow-Up: Come to our office at 10:00 on September 16,2019 to have leg checked.   Any Other Special Instructions Will Be Listed Below (If Applicable).  Your physician discussed the importance of taking an antibiotic prior to any dental, gastrointestinal, genitourinary procedures to prevent damage to the heart valves from infection. A prescription was sent to your pharmacy.        If you need a refill  on your cardiac medications before your next appointment, please call your pharmacy.      Signed, Cline Crock, PA-C  08/03/2018 6:33 PM    [facility] [address] [place], [place], [name]  [address] Phone: [phone]; Fax: [phone]

## 2018-08-04 LAB — BASIC METABOLIC PANEL
BUN / CREAT RATIO: 14 (ref 10–24)
BUN: 16 mg/dL (ref 8–27)
CHLORIDE: 99 mmol/L (ref 96–106)
CO2: 24 mmol/L (ref 20–29)
Calcium: 9.6 mg/dL (ref 8.6–10.2)
Creatinine, Ser: 1.12 mg/dL (ref 0.76–1.27)
GFR calc non Af Amer: 68 mL/min/{1.73_m2} (ref >59)
GFR, EST AFRICAN AMERICAN: 78 mL/min/{1.73_m2} (ref >59)
GLUCOSE: 95 mg/dL (ref 65–99)
POTASSIUM: 4.4 mmol/L (ref 3.5–5.2)
Sodium: 141 mmol/L (ref 134–144)

## 2018-08-07 ENCOUNTER — Telehealth: Payer: Self-pay | Admitting: Physician Assistant

## 2018-08-07 ENCOUNTER — Other Ambulatory Visit: Payer: Self-pay | Admitting: Physician Assistant

## 2018-08-07 ENCOUNTER — Ambulatory Visit: Payer: Medicare Other | Admitting: Physician Assistant

## 2018-08-07 MED ORDER — DOXYCYCLINE MONOHYDRATE 100 MG PO CAPS: 100.0000 mg | ORAL_CAPSULE | Freq: Two times a day (BID) | ORAL | 0 refills | Status: DC

## 2018-08-07 NOTE — Telephone Encounter (Signed)
  HEART AND VASCULAR CENTER   MULTIDISCIPLINARY HEART VALVE TEAM   Seen in office for a groin check. He has been holding Eliquis. Oozing has mostly stopped but it is malodorous. We will continue to hold Eliquis. Groin site does not appear infected but there is a component of possible keloid formation. I discussed case with pharmacy and will call in doxy 100mg  x 1 week. I will call him back on Thursday and if bleeding totally stopped we will resume Eliquis. I see him back from 1 month appointment on 08/31/18.  Cline CrockKathryn Cadance Raus PA-C  [facility]

## 2018-08-09 ENCOUNTER — Ambulatory Visit: Payer: Medicare Other | Admitting: Cardiovascular Disease

## 2018-08-09 ENCOUNTER — Telehealth: Payer: Self-pay | Admitting: Physician Assistant

## 2018-08-09 NOTE — Telephone Encounter (Signed)
  HEART AND VASCULAR CENTER   MULTIDISCIPLINARY HEART VALVE TEAM   Called to check in on patient's groin wound. Smell improving and only having to change bandage once a day now but still bleeding. I will call back Friday and if bleeding stopped we will restart Eliquis.  Cline CrockKathryn Yadier Bramhall PA-C  [facility]

## 2018-08-14 ENCOUNTER — Encounter: Payer: Self-pay | Admitting: Thoracic Surgery (Cardiothoracic Vascular Surgery)

## 2018-08-14 ENCOUNTER — Telehealth: Payer: Self-pay | Admitting: Physician Assistant

## 2018-08-14 ENCOUNTER — Other Ambulatory Visit: Payer: Self-pay

## 2018-08-14 NOTE — Telephone Encounter (Signed)
  HEART AND VASCULAR CENTER   MULTIDISCIPLINARY HEART VALVE TEAM   Talked with Mr Joel Rogers today about his groin site. The smell has resolved after abx. It has almost completely healed except for a small spot of bleeding. I think he is okay to resume his home Eliquis. He verbalized understanding.   Cline CrockKathryn Merline Perkin PA-C  [facility]

## 2018-08-14 NOTE — Patient Outreach (Signed)
[facility]) Care Management  08/14/2018  Claudie LeachRalph L Monforte [birth year] [id number]   EMMI- Heart Failure RED ON EMMI ALERT Day # 27 Date: 08/13/18 Red Alert Reason:  Weight (lbs) 233  Lost interest in things they used to enjoy? Yes   Outreach attempt: Male answered stating patient not in.  CM contact information given for return call.     Plan: RN CM will send letter and attempt again within 4 business days.    Bary Lericheionne J Navya Timmons, RN, MSN [facility] Care [facility] Care Management Coordinator Direct Line [phone] Toll Free: [phone]  Fax: [phone]

## 2018-08-14 NOTE — Patient Outreach (Signed)
[facility]) Care Management  08/14/2018  Joel Rogers [birth year] [id number]   EMMI-Heart Failure RED ON EMMI ALERT Day # 27 Date:  9/221/9 Red Alert Reason:  Weight- 233 lbs Lost interest in things they used to enjoy? Yes  Incoming call from patient.  He is able to verify HIPAA.  Discussed red alert with patient.  He states that his weight has been stable but her had a weight gain.  He admitted to going over this weekend.  Patient reports that he is taking his medication as ordered.  Discussed with patient importance of watching salt intake and what foods to stay away from.  He verbalized understanding.  Patient declines any further needs presently.    Plan: RN CM will close case.    Joel Lericheionne J Dakotah Heiman, RN, MSN [facility] Care Management Care Management Coordinator Direct Line [phone] Toll Free: [phone]  Fax: [phone]

## 2018-08-16 ENCOUNTER — Ambulatory Visit: Payer: Self-pay

## 2018-08-21 ENCOUNTER — Other Ambulatory Visit: Payer: Self-pay

## 2018-08-21 NOTE — Patient Outreach (Signed)
[facility]) Care Management  08/21/2018  MAXIMUM REILAND 11-18-1951 [id number]   EMMI-Heart Failure RED ON EMMI ALERT Day # 34 Date: 08/20/18 Red Alert Reason: Weight- 232 lbs  No answer.  HIPAA compliant voice message left.  Plan: RN CM will send letter and attempt patient again within 4 business days.    [name], RN, MSN [facility] Care Management Care Management Coordinator Direct Line [phone] Cell [phone] Toll Free: [phone]  Fax: [phone]

## 2018-08-22 ENCOUNTER — Ambulatory Visit: Payer: Medicare Other | Admitting: Cardiovascular Disease

## 2018-08-22 ENCOUNTER — Other Ambulatory Visit: Payer: Self-pay

## 2018-08-22 NOTE — Patient Outreach (Signed)
[facility]) Care Management  08/22/2018  Joel Rogers 10-16-1951 [id number]   EMMI-Heart Failure RED ON EMMI ALERT Day #34 Date:08/20/18 Red Alert Reason: Weight- 232 lbs  Telephone call to patient.  He is able to verify HIPAA. Discussed red alert.  Patient reports that he is working on his weight and he knows he was 2 lbs up on Sunday.  Patient reports that his weight was 232 lbs today as well.  Discussed with patient heart failure and importance of diet.  He verbalized understanding and states that he is watching labels and staying away from salt.  Patient taking his medications as prescribed.   Patient declines any further needs.    Advised patient that daily calls will continue and if he has any questionable answers that a nurse will call.  He verbalized understanding and appreciative of call.  Plan: RN CM will close case.    [name], RN, MSN [facility] Care Management Care Management Coordinator Direct Line [phone] Cell [phone] Toll Free: [phone]  Fax: [phone]

## 2018-08-28 ENCOUNTER — Other Ambulatory Visit: Payer: Self-pay

## 2018-08-28 NOTE — Patient Outreach (Signed)
[facility]) Care Management  08/28/2018  Joel Rogers [birth year] [id number]   EMMI- Heart Failure RED ON EMMI ALERT Day # 39 Date: 08/25/18 Red Alert Reason:  Weight 233 lbs  Outreach attempt: Spoke with patient.  He is able to verify HIPAA.  Discussed red alert.  He states that his weight has fluctuated between 230 and 233 lbs.  He states that today's weight was 231 lbs. He denies any shortness of breath, swelling or any new symptoms.  Patient appreciative of call but denies any needs presently.     Plan: RN CM will close case.  [name], RN, MSN [facility] Care Management Care Management Coordinator Direct Line [phone] Toll Free: [phone]  Fax: [phone]

## 2018-08-30 ENCOUNTER — Other Ambulatory Visit: Payer: Self-pay

## 2018-08-30 NOTE — Patient Outreach (Signed)
[facility]) Care Management  08/30/2018  Joel Rogers 02/15/1951 [id number]   EMMI- Heart Failure RED ON EMMI ALERT Day # 43 Date: 08/29/18 Red Alert Reason:  Know why to take meds? No  Outreach attempt: spoke with patient.  He reports that he is doing good.  He states that he does have a question about one of his medications.  Reviewed each medication with patient.  He questions his aldactone. Discussed with patient uses for aldactone and how it works.  Patient states he remembers now and that his doctor put him on that as well as torsemide.  He states that he is taking medications as prescribed and offers no other questions.  Patient reports that he has a doctor's appointment on tomorrow.  Advised patient that if he has other questions about medications to discuss with physician.  He verbalized understanding and voices no other concerns.    Plan: RN CM will close case.    [name], RN, MSN [facility] Care Management Care Management Coordinator Direct Line [phone] Toll Free: [phone]  Fax: [phone]

## 2018-08-31 ENCOUNTER — Other Ambulatory Visit: Payer: Self-pay | Admitting: Physician Assistant

## 2018-08-31 ENCOUNTER — Encounter: Payer: Self-pay | Admitting: Physician Assistant

## 2018-08-31 ENCOUNTER — Ambulatory Visit (HOSPITAL_COMMUNITY): Payer: Medicare Other | Attending: Internal Medicine

## 2018-08-31 ENCOUNTER — Other Ambulatory Visit: Payer: Self-pay

## 2018-08-31 ENCOUNTER — Ambulatory Visit (INDEPENDENT_AMBULATORY_CARE_PROVIDER_SITE_OTHER): Payer: Medicare Other | Admitting: Physician Assistant

## 2018-08-31 VITALS — BP 146/80 | HR 69 | Ht 72.0 in | Wt 232.0 lb

## 2018-08-31 DIAGNOSIS — Z952 Presence of prosthetic heart valve: Secondary | ICD-10-CM | POA: Insufficient documentation

## 2018-08-31 DIAGNOSIS — I5032 Chronic diastolic (congestive) heart failure: Secondary | ICD-10-CM | POA: Diagnosis not present

## 2018-08-31 DIAGNOSIS — I482 Chronic atrial fibrillation, unspecified: Secondary | ICD-10-CM

## 2018-08-31 DIAGNOSIS — R918 Other nonspecific abnormal finding of lung field: Secondary | ICD-10-CM

## 2018-08-31 DIAGNOSIS — I10 Essential (primary) hypertension: Secondary | ICD-10-CM | POA: Diagnosis not present

## 2018-08-31 NOTE — Patient Instructions (Addendum)
Medication Instructions:  1) you may STOP ASPIRIN 01/24/2019 (unless told otherwise by a physician)  Labwork: None today  Testing/Procedures: Your provider has requested that you have an echocardiogram in 1 year. Echocardiography is a painless test that uses sound waves to create images of your heart. It provides your doctor with information about the size and shape of your heart and how well your heart's chambers and valves are working. This procedure takes approximately one hour. There are no restrictions for this procedure.    Follow-Up: You have an appointment with Dr. Allyson Sabal on November 28, 2018 at 11:15AM.   You will be called to arrange your 1 year visits for your echocardiogram and evaluation with Carlean Jews.

## 2018-08-31 NOTE — Progress Notes (Signed)
[facility]   MULTIDISCIPLINARY HEART VALVE CLINIC                                       Cardiology Office Note    Date:  08/31/2018   ID:  Joel Rogers, DOB 11/28/1950, MRN [id number]  PCP:  Dorena Bodo, PA-C  Cardiologist: Dr. Allyson Sabal / Dr. Clifton James & Dr. Cornelius Moras (TAVR)  CC: 1 month s/p TAVR   History of Present Illness:  Joel Rogers is a 67 y.o. male with a history of HTN, HLD, DMT2, chronic afib on Eliquis, morbid obesity s/p gastric bypass, OSA on CPAP, pulmonary HTN, chronic diastolic CHF, subdural hematoma after traumatic fall, and severe AS s/p TAVR (07/25/18) who presents to clinic for follow up.   He has been followed for aortic stenosis for several years. An echocardiogram on 02/09/2018 showed severe aortic stenosis with a mean gradient of 40 mmHg and a peak gradient of 76 mmHg. The dimensionless index was 0.28. Left ventricular ejection fraction was 60 to 65% with grade 3 diastolic dysfunction. Catheterization 07/06/2018 showed severe elevation of his right heart pressures with a mean right atrial pressure of 26 mmHg and RV end-diastolic pressure of 24 mmHg. The case was aborted after the right heart cath due to respiratory distress during the procedure. With his severe pulmonary hypertension documented by previous echocardiogram he was admitted to the advanced heart failure team for optimization and diuresis.A repeat echocardiogram on 07/07/2018 showed progression of his aortic stenosis with a rise in the mean gradient to 57 mmHg and a peak gradient of 100 mmHg. The right ventricle was mildly dilated. Left ventricular ejection fraction was 65 to 70%. He subsequently underwent repeat right and left heart cath on 07/10/2018. This showed normal coronary arteries and severe pulmonary hypertension. Optimization with diuresis was continued by the heart failure team. Due to poor dentition he underwent extraction of multiple teeth.  He underwent successful TAVR with a 26 mm  Edwards Sapien 3 THV via the TF approach on 07/25/18. Post operative echo showed normally functioning TAVR with mean gradient of 12 mm Hg and no PVL. He was treated with IV lasix for volume overload and then restarted on home torsemide. He was discharged on ASA 81mg  daily and started on Eliquis given chronic afib and CHADSVASC score of 3 and possible LAA thrombus on pre TAVR CT scan (previously stopped due to subdural hematoma).  He has done quite well in follow up and feeling great. He had some persistant oozing from his groin site and a malodorous smell for which he was treated with Abx. Eliquis was also held for about a week to allow for complete healing.   Today he presents to clinic for follow up. He is so happy with how he is feeling. He is now able to walk 30 minutes round trip with no issues. Mobility is mostly limited by his hip pain but he is able to walk with no issues. This is the best he has felt in 12 years. He has changed his eating habits. He has lost about 12 lbs over the last month. No CP or SOB. No LE edema, orthopnea or PND. No dizziness or syncope. No blood in stool or urine. No palpitations. Groin site completely healed.    Past Medical History:  Diagnosis Date  . Anemia    low iron  . Arthritis  bilateral knees  . Atrial fibrillation, chronic   . Chronic diastolic CHF (congestive heart failure) (HCC) 06/15/2018  . History of subdural hematoma   . Hyperlipidemia   . Hypertension   . Morbid obesity (HCC)   . Normal coronary arteries    by cardiac catheterization performed 03/14/06  . Obesity   . Pre-diabetes    pt denies  . S/P TAVR (transcatheter aortic valve replacement)    a. 07/25/18: Edwards Sapien 3 THV (size 26 mm, model # 9600TFX, serial # [id number]) by Dr. Laneta Simmers and Dr. Clifton James  . Sleep apnea    on CPAP  . Venous insufficiency     Past Surgical History:  Procedure Laterality Date  . CRANIOTOMY Right 08/27/2016   Procedure: CRANIOTOMY HEMATOMA EVACUATION  SUBDURAL;  Surgeon: Maeola Harman, MD;  Location: [facility] OR;  Service: Neurosurgery;  Laterality: Right;  . GASTRIC BYPASS  09/2008  . JOINT REPLACEMENT  08/31/2006   right hip  . MULTIPLE EXTRACTIONS WITH ALVEOLOPLASTY N/A 07/13/2018   Procedure: Extraction of tooth #'s 3,8,10, 23-26, 30and 32 with alveoloplasty and gross debridement of remaining teeth;  Surgeon: Charlynne Pander, DDS;  Location: [facility] OR;  Service: Oral Surgery;  Laterality: N/A;  . RIGHT HEART CATH N/A 07/06/2018   Procedure: RIGHT HEART CATH;  Surgeon: Kathleene Hazel, MD;  Location: [facility] INVASIVE CV LAB;  Service: Cardiovascular;  Laterality: N/A;  . RIGHT/LEFT HEART CATH AND CORONARY ANGIOGRAPHY N/A 07/10/2018   Procedure: RIGHT/LEFT HEART CATH AND CORONARY ANGIOGRAPHY;  Surgeon: Dolores Patty, MD;  Location: [facility] INVASIVE CV LAB;  Service: Cardiovascular;  Laterality: N/A;  . TEE WITHOUT CARDIOVERSION N/A 07/25/2018   Procedure: TRANSESOPHAGEAL ECHOCARDIOGRAM (TEE);  Surgeon: Kathleene Hazel, MD;  Location: [facility] OR;  Service: Open Heart Surgery;  Laterality: N/A;  . TOTAL HIP ARTHROPLASTY     right hip  . TRANSCATHETER AORTIC VALVE REPLACEMENT, TRANSFEMORAL  07/25/2018  . TRANSCATHETER AORTIC VALVE REPLACEMENT, TRANSFEMORAL N/A 07/25/2018   Procedure: TRANSCATHETER AORTIC VALVE REPLACEMENT, TRANSFEMORAL;  Surgeon: Kathleene Hazel, MD;  Location: [facility] OR;  Service: Open Heart Surgery;  Laterality: N/A;    Current Medications: Outpatient Medications Prior to Visit  Medication Sig Dispense Refill  . acetaminophen (TYLENOL) 650 MG CR tablet Take 650 mg by mouth 2 (two) times daily as needed for pain.    Marland Kitchen amoxicillin (AMOXIL) 500 MG tablet Take 4 tablets by mouth one hour prior to dental appointments 4 tablet 3  . apixaban (ELIQUIS) 5 MG TABS tablet Take 1 tablet (5 mg total) by mouth 2 (two) times daily. 180 tablet 1  . aspirin 81 MG chewable tablet Chew 1 tablet (81 mg total) by mouth daily.    Marland Kitchen atorvastatin  (LIPITOR) 80 MG tablet TAKE ONE TABLET BY MOUTH ONCE DAILY WITH BREAKFAST 90 tablet 1  . diltiazem (CARDIZEM CD) 120 MG 24 hr capsule Take 1 capsule (120 mg total) by mouth daily. 90 capsule 01  . spironolactone (ALDACTONE) 25 MG tablet Take 0.5 tablets (12.5 mg total) by mouth daily. 30 tablet 5  . torsemide (DEMADEX) 20 MG tablet Take 40 mg by mouth as needed. Use as needed if weight is over 245 pounds.    . Vitamin D, Ergocalciferol, (DRISDOL) 50000 units CAPS capsule TAKE ONE CAPSULE BY MOUTH EVERY 7 DAYS 30 capsule 1  . doxycycline (MONODOX) 100 MG capsule Take 1 capsule (100 mg total) by mouth 2 (two) times daily. (Patient not taking: Reported on 08/31/2018) 14 capsule 0  . torsemide (  DEMADEX) 20 MG tablet Take 2 tablets (40 mg total) by mouth daily. (Patient not taking: Reported on 08/31/2018) 60 tablet 11   No facility-administered medications prior to visit.      Allergies:   Patient has no known allergies.   Social History   Socioeconomic History  . Marital status: Married    Spouse name: [name]  . Number of children: 2  . Years of education: 47  . Highest education level: Not on file  Occupational History  . 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 Financial resource strain: Not on file  . Food insecurity:    Worry: Not on file    Inability: Not on file  . Transportation needs:    Medical: Not on file    Non-medical: Not on file  Tobacco Use  . Smoking status: Former Games developer  . Smokeless tobacco: Never Used  Substance and Sexual Activity  . Alcohol use: No  . Drug use: No  . Sexual activity: Not Currently  Lifestyle  . Physical activity:    Days per week: Not on file    Minutes per session: Not on file  . Stress: Not on file  Relationships  . Social connections:    Talks on phone: Not on file    Gets together: Not on file    Attends religious service: Not on file    Active member of club or  organization: Not on file    Attends meetings of clubs or organizations: Not on file    Relationship status: Not on file  Other Topics Concern  . 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     ROS:   Please see the history of present illness.    ROS All other systems reviewed and are negative.   PHYSICAL EXAM:   VS:  BP (!) 146/80   Pulse 69   Ht 6' (1.829 m)   Wt 232 lb (105.2 kg)   SpO2 98%   BMI 31.46 kg/m    GEN: Well nourished, well developed, in no acute distress, obese HEENT: normal Neck: no JVD or masses Cardiac: irreg irreg. Soft flow murmur. No rubs, or gallops. Chronic LE edema, R>L, stockings on  Respiratory:  clear to auscultation bilaterally, normal work of breathing GI: soft, nontender, nondistended, + BS MS: no deformity or atrophy Skin: warm and dry, no rash Neuro:  Alert and Oriented x 3, Strength and sensation are intact Psych: euthymic mood, full affect   Wt Readings from Last 3 Encounters:  08/31/18 232 lb (105.2 kg)  08/03/18 235 lb (106.6 kg)  07/26/18 244 lb 11.2 oz (111 kg)      Studies/Labs Reviewed:   EKG:  EKG is NOT ordered today.    Recent Labs: 07/21/2018: ALT 20; B Natriuretic Peptide 376.1 07/26/2018: Hemoglobin 12.2; Magnesium 1.9; Platelets 240 08/03/2018: BUN 16; Creatinine, Ser 1.12; Potassium 4.4; Sodium 141   Lipid Panel    Component Value Date/Time   CHOL 122 05/01/2018 0913   TRIG 41 05/01/2018 0913   HDL 40 (L) 05/01/2018  0913   CHOLHDL 3.1 05/01/2018 0913   VLDL 9 11/26/2016 1406   LDLCALC 70 05/01/2018 0913    Additional studies/ records that were reviewed  today include:  TAVR OPERATIVE NOTE   Date of Procedure:07/25/2018  Preoperative Diagnosis:Severe Aortic Stenosis  Procedure:   Transcatheter Aortic Valve Replacement - PercutaneousLeftTransfemoral Approach Edwards Sapien 3 THV (size 26mm, model # 9600TFX, serial # [id number])  Co-Surgeons:Bryan Jennefer Bravo, MD and Verne Carrow, MD  Pre-operative Echo Findings: ? Severe aortic stenosis ? Normalleft ventricular systolic function  Post-operative Echo Findings: ? Noparavalvular leak ? Normalleft ventricular systolic function _____________  2D ECHO 07/26/18 Study Conclusions - Left ventricle: The cavity size was normal. Wall thickness was   increased in a pattern of moderate LVH. Systolic function was   normal. The estimated ejection fraction was in the range of 60%   to 65%. Wall motion was normal; there were no regional wall   motion abnormalities. - Aortic valve: A bioprosthesis was present. Valve area (VTI): 1.87   cm^2. Valve area (Vmax): 1.84 cm^2. Valve area (Vmean): 1.94   cm^2. - Left atrium: The atrium was massively dilated. - Right atrium: The atrium was severely dilated. - Pulmonary arteries: Systolic pressure was moderately to severely   increased. PA peak pressure: 69 mm Hg (S).  _________________  Echo 08/31/18 Study Conclusions - Left ventricle: The cavity size was normal. There was moderate   concentric hypertrophy. Systolic function was vigorous. The   estimated ejection fraction was in the range of 65% to 70%. Wall   motion was normal; there were no regional wall motion   abnormalities. Doppler parameters are consistent with high   ventricular filling pressure. - Aortic valve: A TAVR bioprosthesis was present and functioning   normally. Mildly higher than expected velocity. Peak velocity   (S): 329 cm/s. Mean gradient (S): 18 mm Hg. - Mitral valve: Calcified annulus. - Left  atrium: The atrium was severely dilated. - Right ventricle: The cavity size was mildly dilated. Wall   thickness was normal. - Right atrium: The atrium was severely dilated. - Tricuspid valve: There was moderate regurgitation.  ASSESSMENT & PLAN:   Severe AS s/p TAVR: doing excellent. He feels the best he has in many many years. Groin site is completely healed and no longer has a foul odor s/p Abx. 2D ECHO today shows EF 65%, normally functioning TAVR valve with no PVL, but mildly higher than expected velocity and mean gradient (increased from 12 mmHg to 18 mmHg.)  Given increasing gradients, I will check an echo in 6 months to ensure stability.  He has NYHA class I symptoms. SBE prophylaxis discussed; he has amoxicillin. ASA can be discontinued after 6 months of therapy 01/2019. He will continue on Eliquis   Chronic atrial fibrillation: rate well controlled. Continue Eliquis for thromboembolic prophylaxis  Chronic diastolic CHF: appears euvolemic. Continue Torsemide and spiro  HTN: BP with borderline control today. 140/80 on my personal recheck. He says it is always more elevated at the Drs office. No changes made  Morbid obesity: Body mass index is 31.46 kg/m. He has been working hard on diet and exercise and continues to loose weight.   Pulmonary nodules: noted on pre TAVR CT scans. Patient is a lifetime non smoker with no significant exposure to second hand smoke even though he did work in a cigarette factory. Radiology recommended 12 month CT follow up if he is high risk. We dicussed this today. I do not believe he is high  risk and have recommended no further testing.   Medication Adjustments/Labs and Tests Ordered: Current medicines are reviewed at length with the patient today.  Concerns regarding medicines are outlined above.  Medication changes, Labs and Tests ordered today are listed in the Patient Instructions below. Patient Instructions  Medication Instructions:  1) you may  STOP ASPIRIN 01/24/2019 (unless told otherwise by a physician)  Labwork: None today  Testing/Procedures: Your provider has requested that you have an echocardiogram in 1 year. Echocardiography is a painless test that uses sound waves to create images of your heart. It provides your doctor with information about the size and shape of your heart and how well your heart's chambers and valves are working. This procedure takes approximately one hour. There are no restrictions for this procedure.    Follow-Up: You have an appointment with Dr. Allyson Sabal on November 28, 2018 at 11:15AM.   You will be called to arrange your 1 year visits for your echocardiogram and evaluation with Carlean Jews.     Signed, Cline Crock, PA-C  08/31/2018 7:41 PM    [facility] [address] [place], [place], [name]  [address] Phone: [phone]; Fax: [phone]

## 2018-09-01 ENCOUNTER — Ambulatory Visit (INDEPENDENT_AMBULATORY_CARE_PROVIDER_SITE_OTHER): Payer: Medicare Other

## 2018-09-01 DIAGNOSIS — Z23 Encounter for immunization: Secondary | ICD-10-CM

## 2018-09-01 NOTE — Progress Notes (Signed)
Patient was in office to receive the high dose flu vaccine.Patient received the vaccine in his left deltoid Patient tolerated well  

## 2018-09-05 ENCOUNTER — Encounter: Payer: Self-pay | Admitting: Thoracic Surgery (Cardiothoracic Vascular Surgery)

## 2018-09-10 ENCOUNTER — Other Ambulatory Visit: Payer: Self-pay | Admitting: Physician Assistant

## 2018-09-12 NOTE — Telephone Encounter (Signed)
Okay to refill? Please advise. Thanks, MI 

## 2018-09-13 ENCOUNTER — Other Ambulatory Visit: Payer: Self-pay | Admitting: Cardiovascular Disease

## 2018-09-13 ENCOUNTER — Other Ambulatory Visit: Payer: Self-pay | Admitting: Physician Assistant

## 2018-09-13 NOTE — Telephone Encounter (Signed)
Pt's pharmacy is requesting a refill on amoxicillin 500 mg tablet for a dental procedure. Would you like to refill this medication? Please address

## 2018-09-19 ENCOUNTER — Other Ambulatory Visit: Payer: Self-pay | Admitting: Physician Assistant

## 2018-10-27 ENCOUNTER — Other Ambulatory Visit (HOSPITAL_COMMUNITY): Payer: Medicare Other

## 2018-11-01 ENCOUNTER — Ambulatory Visit (INDEPENDENT_AMBULATORY_CARE_PROVIDER_SITE_OTHER): Payer: Medicare Other | Admitting: Family Medicine

## 2018-11-01 ENCOUNTER — Ambulatory Visit: Payer: Medicare Other | Admitting: Physician Assistant

## 2018-11-01 ENCOUNTER — Encounter: Payer: Self-pay | Admitting: Family Medicine

## 2018-11-01 ENCOUNTER — Other Ambulatory Visit: Payer: Self-pay

## 2018-11-01 VITALS — BP 140/68 | HR 68 | Temp 98.3°F | Resp 14 | Ht 72.0 in | Wt 243.0 lb

## 2018-11-01 DIAGNOSIS — I10 Essential (primary) hypertension: Secondary | ICD-10-CM | POA: Diagnosis not present

## 2018-11-01 DIAGNOSIS — I482 Chronic atrial fibrillation, unspecified: Secondary | ICD-10-CM

## 2018-11-01 DIAGNOSIS — Z952 Presence of prosthetic heart valve: Secondary | ICD-10-CM

## 2018-11-01 DIAGNOSIS — Z9884 Bariatric surgery status: Secondary | ICD-10-CM

## 2018-11-01 NOTE — Assessment & Plan Note (Signed)
Feels great since the surgery

## 2018-11-01 NOTE — Patient Instructions (Signed)
F/U 3-4 months Wellness

## 2018-11-01 NOTE — Progress Notes (Signed)
   Subjective:    Patient ID: Joel LeachRalph L Glendinning, male    DOB: 13-Dec-1950, 67 y.o.   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   He recently had TAVR for aortic stenosis. He has done quite well since his surgery in Sept, sates he feels great.  He is maintained on eliquis, he has histiry of chronic A FIB AS WELL.   Followed for Chronic diastolic HF, pulmonary HTn, NO LONGER on CPAP for OSA since the valve replacement. On Demadex and now on 1/2 tablet of the spirnolactone  He will stop his Baby ASA in March    Hyperlipidemia- taking lipitor at bedtime   HTN- taking cardizem  Vitamin D Def on 50,000IU once a week    Gout- has colchicine as needed    Optometr- [facility]    Has history of anemia was given iron suppement but due to constipation had to stop    History of bariatric surgery > 10 years ago , he weighed 392 in the past   now walking up to 4 miles a day           Review Of Systems:  GEN- denies fatigue, fever, weight loss,weakness, recent illness HEENT- denies eye drainage, change in vision, nasal discharge, CVS- denies chest pain, palpitations RESP- denies SOB, cough, wheeze ABD- denies N/V, change in stools, abd pain GU- denies dysuria, hematuria, dribbling, incontinence MSK- denies joint pain, muscle aches, injury Neuro- denies headache, dizziness, syncope, seizure activity       Objective:    BP 140/68   Pulse 68   Temp 98.3 F (36.8 C) (Oral)   Resp 14   Ht 6' (1.829 m)   Wt 243 lb (110.2 kg)   SpO2 98%   BMI 32.96 kg/m  GEN- NAD, alert and oriented x3 HEENT- PERRL, EOMI, non injected sclera, pink conjunctiva, MMM, oropharynx clear Neck- Supple, no thyromegaly CVS- irregular rhythem, systolic murmur RESP-CTAB ABD-NABS,soft,NT,ND EXT- non pitting edema R >L  Pulses- Radial, DP- 2+        Assessment & Plan:      Problem List  Items Addressed This Visit      Unprioritized   Chronic atrial fibrillation    On eliquis, doing well, rate controlled      HTN (hypertension) - Primary    Well controlled no changes       Relevant Orders   CBC with Differential/Platelet   Comprehensive metabolic panel   Morbid obesity (HCC)    S/p weight loss surgery in the past Trying to stay active since his valve surgery      S/P TAVR (transcatheter aortic valve replacement)    Feels great since the surgery       Status post gastric bypass for obesity      Note: This dictation was prepared with Dragon dictation along with smaller phrase technology. Any transcriptional errors that result from this process are unintentional.

## 2018-11-01 NOTE — Assessment & Plan Note (Signed)
Well controlled no changes 

## 2018-11-01 NOTE — Assessment & Plan Note (Signed)
S/p weight loss surgery in the past Trying to stay active since his valve surgery

## 2018-11-01 NOTE — Assessment & Plan Note (Signed)
On eliquis, doing well, rate controlled

## 2018-11-02 LAB — CBC WITH DIFFERENTIAL/PLATELET
Basophils Absolute: 73 cells/uL (ref 0–200)
Basophils Relative: 1 %
EOS ABS: 110 {cells}/uL (ref 15–500)
Eosinophils Relative: 1.5 %
HCT: 38.5 % (ref 38.5–50.0)
HEMOGLOBIN: 12.5 g/dL — AB (ref 13.2–17.1)
LYMPHS ABS: 1584 {cells}/uL (ref 850–3900)
MCH: 25.5 pg — AB (ref 27.0–33.0)
MCHC: 32.5 g/dL (ref 32.0–36.0)
MCV: 78.6 fL — ABNORMAL LOW (ref 80.0–100.0)
Monocytes Relative: 11.2 %
NEUTROS PCT: 64.6 %
Neutro Abs: 4716 cells/uL (ref 1500–7800)
Platelets: 178 10*3/uL (ref 140–400)
RBC: 4.9 10*6/uL (ref 4.20–5.80)
RDW: 16.6 % — ABNORMAL HIGH (ref 11.0–15.0)
TOTAL LYMPHOCYTE: 21.7 %
WBC mixed population: 818 cells/uL (ref 200–950)
WBC: 7.3 10*3/uL (ref 3.8–10.8)

## 2018-11-02 LAB — COMPREHENSIVE METABOLIC PANEL
AG RATIO: 1.2 (calc) (ref 1.0–2.5)
ALBUMIN MSPROF: 3.8 g/dL (ref 3.6–5.1)
ALKALINE PHOSPHATASE (APISO): 104 U/L (ref 40–115)
ALT: 14 U/L (ref 9–46)
AST: 27 U/L (ref 10–35)
BUN: 22 mg/dL (ref 7–25)
CALCIUM: 9.4 mg/dL (ref 8.6–10.3)
CHLORIDE: 103 mmol/L (ref 98–110)
CO2: 31 mmol/L (ref 20–32)
CREATININE: 1.23 mg/dL (ref 0.70–1.25)
GLOBULIN: 3.3 g/dL (ref 1.9–3.7)
Glucose, Bld: 85 mg/dL (ref 65–99)
Potassium: 4 mmol/L (ref 3.5–5.3)
Sodium: 142 mmol/L (ref 135–146)
TOTAL PROTEIN: 7.1 g/dL (ref 6.1–8.1)
Total Bilirubin: 1 mg/dL (ref 0.2–1.2)

## 2018-11-06 ENCOUNTER — Encounter: Payer: Self-pay | Admitting: *Deleted

## 2018-11-28 ENCOUNTER — Encounter: Payer: Self-pay | Admitting: Cardiovascular Disease

## 2018-11-28 ENCOUNTER — Ambulatory Visit (INDEPENDENT_AMBULATORY_CARE_PROVIDER_SITE_OTHER): Payer: Medicare Other | Admitting: Cardiovascular Disease

## 2018-11-28 VITALS — BP 122/68 | HR 54 | Ht 73.0 in | Wt 249.0 lb

## 2018-11-28 DIAGNOSIS — Z952 Presence of prosthetic heart valve: Secondary | ICD-10-CM | POA: Diagnosis not present

## 2018-11-28 NOTE — Assessment & Plan Note (Signed)
History of chronic A. fib rate controlled on Eliquis oral anticoagulation 

## 2018-11-28 NOTE — Progress Notes (Signed)
11/28/2018 Joel Rogers   1950/12/28  [id number]  Primary Physician Joel Scarlet, MD Primary Cardiologist: Joel Gess MD FACP, [place], [place], [place]  HPI:  Joel Rogers is a 68 y.o.    severely overweight, married, African American male, father of 2, grandfather to 4 grandchildren who I last 02/15/2018.Marland Kitchen He has a history of chronic atrial fibrillation, rate controlled on Coumadin anticoagulation. He has obstructive sleep apnea on CPAP, hypertension, and hyperlipidemia. He was catheterized by Dr. Lavonne Rogers in 2007 and found to have normal coronary arteries and normal coronary function. He had laparoscopic Roux-en-Y gastric bypass surgery in November of 2009 at [facility] and has lost over 140 pounds. He feels clinically improved. His last lipid profile performed a year ago was excellent with an LDL of 82, HDL of 52, total cholesterol 142. He complains of right greater than left lower extremity swelling, which has been chronic. He has some venous reflux in his lesser saphenous vein, probably not amenable to endovenous ablation. Since I saw him 9 months ago he's remained stable. He recently had an episode of syncope in the morning while getting out of bed without other symptoms. He was taken to [facility] emergency room where his workup was unrevealing. He did apparently hit his head and soft with developed a subdural hematoma followed by Dr. Venetia Rogers. This has improved over time.Oral anticoagulationwas discontinued as a result of this.Since I saw him inin August of last year he has remained stable. He does have severe aortic stenosis and just remained stable table by 2-D echo performed 02/09/18 with an aortic valve area of 0.88 cm and a peak gradient is 76 millimeters of mercury.. He does have severe pulmonary hypertension with a pulmonary artery pressure of 92 mmHg. Since I saw him back 6 months ago he did have a right and left heart catheter revealed severe pulmonary hypertension,  severe aortic stenosis and normal coronary arteries.  He ultimately underwent TAVR by Dr. Clifton Rogers 07/25/2018 with an excellent clinical result.  He had an Joel Rogers valve placed via the transfemoral approach.  His most recent 2D echo formed 08/31/2018 revealed normal LV systolic function with a well-functioning aortic bioprosthesis and a mean aortic gradient of 18 mmHg.    Current Meds  Medication Sig  . acetaminophen (TYLENOL) 650 MG CR tablet Take 650 mg by mouth 2 (two) times daily as needed for pain.  Marland Kitchen amoxicillin (AMOXIL) 500 MG capsule Take 2,000 mg (4 tablets) one hour prior to dental visits.  Marland Kitchen apixaban (ELIQUIS) 5 MG TABS tablet Take 1 tablet (5 mg total) by mouth 2 (two) times daily.  Marland Kitchen aspirin 81 MG chewable tablet Chew 1 tablet (81 mg total) by mouth daily.  Marland Kitchen atorvastatin (LIPITOR) 80 MG tablet TAKE 1 TABLET BY MOUTH ONCE DAILY WITH  BREAKFAST  . colchicine 0.6 MG tablet TAKE 1 TABLET BY MOUTH TWICE DAILY  . diltiazem (CARDIZEM CD) 120 MG 24 hr capsule Take 1 capsule (120 mg total) by mouth daily.  . ergocalciferol (VITAMIN D2) 1.25 MG (50000 UT) capsule Take 50,000 Units by mouth once a week.  . spironolactone (ALDACTONE) 25 MG tablet Take 0.5 tablets (12.5 mg total) by mouth daily.  Marland Kitchen torsemide (DEMADEX) 20 MG tablet Take 40 mg by mouth as needed. Use as needed if weight is over 245 pounds.     No Known Allergies  Social History   Socioeconomic History  . Marital status: Married  Spouse name: Joel Rogers  . Number of children: 2  . Years of education: 5816  . Highest education level: Not on file  Occupational History  . 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 Financial resource strain: Not on file  . Food insecurity:    Worry: Not on file    Inability: Not on file  . Transportation needs:    Medical: Not on file    Non-medical: Not on file  Tobacco Use  . Smoking status: Former Games developermoker  . Smokeless  tobacco: Never Used  Substance and Sexual Activity  . Alcohol use: No  . Drug use: No  . Sexual activity: Not Currently  Lifestyle  . Physical activity:    Days per week: Not on file    Minutes per session: Not on file  . Stress: Not on file  Relationships  . Social connections:    Talks on phone: Not on file    Gets together: Not on file    Attends religious service: Not on file    Active member of club or organization: Not on file    Attends meetings of clubs or organizations: Not on file    Relationship status: Not on file  . Intimate partner violence:    Fear of current or ex partner: Not on file    Emotionally abused: Not on file    Physically abused: Not on file    Forced sexual activity: Not on file  Other Topics Concern  . Not on file  Social History Narrative   Doctorate in ministry   Retired Data processing managermaintenance mechanic   Disability from hip replacement   Lives with wife Joel Rogers   Exercises at the [employer] - water exercise     Review of Systems: General: negative for chills, fever, night sweats or weight changes.  Cardiovascular: negative for chest pain, dyspnea on exertion, edema, orthopnea, palpitations, paroxysmal nocturnal dyspnea or shortness of breath Dermatological: negative for rash Respiratory: negative for cough or wheezing Urologic: negative for hematuria Abdominal: negative for nausea, vomiting, diarrhea, bright red blood per rectum, melena, or hematemesis Neurologic: negative for visual changes, syncope, or dizziness All other systems reviewed and are otherwise negative except as noted above.    Blood pressure 122/68, pulse (!) 54, height 6\' 1"  (1.854 m), weight 249 lb (112.9 kg).  General appearance: alert and no distress Neck: no adenopathy, no carotid bruit, no JVD, supple, symmetrical, trachea midline and thyroid not enlarged, symmetric, no tenderness/mass/nodules Lungs: clear to auscultation bilaterally Heart: Soft outflow tract murmur consistent with  aortic stenosis. Extremities: extremities normal, atraumatic, no cyanosis or edema Pulses: 2+ and symmetric Skin: Skin color, texture, turgor normal. No rashes or lesions Neurologic: Alert and oriented X 3, normal strength and tone. Normal symmetric reflexes. Normal coordination and gait  EKG not performed today  ASSESSMENT AND PLAN:   Obstructive sleep apnea History of obstructive sleep apnea on CPAP  Chronic atrial fibrillation (HCC) History of chronic A. fib rate controlled on Eliquis oral anticoagulation.  HTN (hypertension) History of essential hypertension her blood pressure measured today 122/68.  He is on diltiazem meds at current dosing.  Hyperlipemia History of hyperlipidemia on statin therapy with lipid profile performed 05/01/2018 revealing cholesterol 122, LDL 70 and HDL 40.  S/P TAVR (transcatheter aortic valve replacement) History of severe aortic stenosis status post TAVR 07/25/2018 by Dr. Clifton JamesMcAlhany.  He had a cath that showed normal coronary arteries prior to this.  Subsequent 2D echo revealed normal LV systolic function with a well-functioning aortic bioprosthesis.  He feels clinically improved and like a "new man".  He walks 2 miles a day 5 days a week without limitation.      Joel GessJonathan J.  MD FACP,FACC,FAHA, [facility] 11/28/2018 12:27 PM

## 2018-11-28 NOTE — Assessment & Plan Note (Signed)
History of essential hypertension her blood pressure measured today 122/68.  He is on diltiazem meds at current dosing.

## 2018-11-28 NOTE — Assessment & Plan Note (Signed)
History of hyperlipidemia on statin therapy with lipid profile performed 05/01/2018 revealing cholesterol 122, LDL 70 and HDL 40.

## 2018-11-28 NOTE — Patient Instructions (Addendum)
Medication Instructions:  NO CHANGES  If you need a refill on your cardiac medications before your next appointment, please call your pharmacy.   Lab work: NONE If you have labs (blood work) drawn today and your tests are completely normal, you will receive your results only by: Marland Kitchen MyChart Message (if you have MyChart) OR . A paper copy in the mail If you have any lab test that is abnormal or we need to change your treatment, we will call you to review the results.  Testing/Procedures: Your physician has requested that you have an echocardiogram. Echocardiography is a painless test that uses sound waves to create images of your heart. It provides your doctor with information about the size and shape of your heart and how well your heart's chambers and valves are working. This procedure takes approximately one hour. There are no restrictions for this procedure.  October 2020 (ORDERED ALREADY BY Cline Crock, PA)  Follow-Up: At [facility], you and your health needs are our priority.  As part of our continuing mission to provide you with exceptional heart care, we have created designated Provider Care Teams.  These Care Teams include your primary Cardiologist (physician) and Advanced Practice Providers (APPs -  Physician Assistants and Nurse Practitioners) who all work together to provide you with the care you need, when you need it. . You will need a follow up appointment in 12 months.  Please call our office 2 months in advance to schedule this appointment.  You may see Dr. Allyson Sabal or one of the following Advanced Practice Providers on your designated Care Team:   . Corine Shelter, [place] . Azalee Course, PA-C . Micah Flesher, PA-C . Joni Reining, DNP . Theodore Demark, PA-C . Judy Pimple, PA-C . Marjie Skiff, PA-C  Any Other Special Instructions Will Be Listed Below (If Applicable).  FOLLOW UP WITH TAVR (TRANSCATHETER AORTIC VALVE REPLACEMENT)

## 2018-11-28 NOTE — Assessment & Plan Note (Signed)
History of obstructive sleep apnea on CPAP. 

## 2018-11-28 NOTE — Assessment & Plan Note (Signed)
History of severe aortic stenosis status post TAVR 07/25/2018 by Dr. Clifton James.  He had a cath that showed normal coronary arteries prior to this.  Subsequent 2D echo revealed normal LV systolic function with a well-functioning aortic bioprosthesis.  He feels clinically improved and like a "new man".  He walks 2 miles a day 5 days a week without limitation.

## 2018-12-08 ENCOUNTER — Telehealth: Payer: Self-pay | Admitting: Family Medicine

## 2018-12-08 MED ORDER — DILTIAZEM HCL ER COATED BEADS 120 MG PO CP24: 120.0000 mg | ORAL_CAPSULE | Freq: Every day | ORAL | 1 refills | Status: DC

## 2018-12-08 NOTE — Telephone Encounter (Signed)
Refill on cardizem to [facility]

## 2018-12-08 NOTE — Telephone Encounter (Signed)
Prescription sent to pharmacy.

## 2019-01-12 ENCOUNTER — Ambulatory Visit: Payer: Self-pay | Admitting: Family Medicine

## 2019-01-13 ENCOUNTER — Inpatient Hospital Stay (HOSPITAL_COMMUNITY)
Admission: EM | Admit: 2019-01-13 | Discharge: 2019-01-30 | DRG: 871 | Disposition: A | Discharge status: Rehabilitation Facility | Payer: Medicare Other | Attending: Internal Medicine | Admitting: Internal Medicine

## 2019-01-13 ENCOUNTER — Other Ambulatory Visit: Payer: Self-pay

## 2019-01-13 ENCOUNTER — Emergency Department (HOSPITAL_COMMUNITY): Payer: Medicare Other

## 2019-01-13 ENCOUNTER — Encounter (HOSPITAL_COMMUNITY): Payer: Self-pay | Admitting: Emergency Medicine

## 2019-01-13 DIAGNOSIS — I482 Chronic atrial fibrillation, unspecified: Secondary | ICD-10-CM | POA: Diagnosis present

## 2019-01-13 DIAGNOSIS — I69319 Unspecified symptoms and signs involving cognitive functions following cerebral infarction: Secondary | ICD-10-CM | POA: Diagnosis not present

## 2019-01-13 DIAGNOSIS — G4733 Obstructive sleep apnea (adult) (pediatric): Secondary | ICD-10-CM | POA: Diagnosis not present

## 2019-01-13 DIAGNOSIS — M25511 Pain in right shoulder: Secondary | ICD-10-CM

## 2019-01-13 DIAGNOSIS — Z79899 Other long term (current) drug therapy: Secondary | ICD-10-CM

## 2019-01-13 DIAGNOSIS — I69351 Hemiplegia and hemiparesis following cerebral infarction affecting right dominant side: Secondary | ICD-10-CM | POA: Diagnosis not present

## 2019-01-13 DIAGNOSIS — K3189 Other diseases of stomach and duodenum: Secondary | ICD-10-CM | POA: Diagnosis not present

## 2019-01-13 DIAGNOSIS — L0390 Cellulitis, unspecified: Secondary | ICD-10-CM | POA: Diagnosis present

## 2019-01-13 DIAGNOSIS — D72829 Elevated white blood cell count, unspecified: Secondary | ICD-10-CM

## 2019-01-13 DIAGNOSIS — M7989 Other specified soft tissue disorders: Secondary | ICD-10-CM | POA: Diagnosis present

## 2019-01-13 DIAGNOSIS — R51 Headache: Secondary | ICD-10-CM | POA: Diagnosis not present

## 2019-01-13 DIAGNOSIS — R945 Abnormal results of liver function studies: Secondary | ICD-10-CM

## 2019-01-13 DIAGNOSIS — N170 Acute kidney failure with tubular necrosis: Secondary | ICD-10-CM | POA: Diagnosis not present

## 2019-01-13 DIAGNOSIS — W19XXXA Unspecified fall, initial encounter: Secondary | ICD-10-CM | POA: Diagnosis not present

## 2019-01-13 DIAGNOSIS — R579 Shock, unspecified: Secondary | ICD-10-CM | POA: Diagnosis not present

## 2019-01-13 DIAGNOSIS — I872 Venous insufficiency (chronic) (peripheral): Secondary | ICD-10-CM | POA: Diagnosis not present

## 2019-01-13 DIAGNOSIS — M25551 Pain in right hip: Secondary | ICD-10-CM

## 2019-01-13 DIAGNOSIS — E669 Obesity, unspecified: Secondary | ICD-10-CM | POA: Diagnosis present

## 2019-01-13 DIAGNOSIS — K921 Melena: Secondary | ICD-10-CM | POA: Diagnosis not present

## 2019-01-13 DIAGNOSIS — I6389 Other cerebral infarction: Secondary | ICD-10-CM | POA: Diagnosis not present

## 2019-01-13 DIAGNOSIS — I272 Pulmonary hypertension, unspecified: Secondary | ICD-10-CM | POA: Diagnosis not present

## 2019-01-13 DIAGNOSIS — I639 Cerebral infarction, unspecified: Secondary | ICD-10-CM | POA: Diagnosis present

## 2019-01-13 DIAGNOSIS — I10 Essential (primary) hypertension: Secondary | ICD-10-CM | POA: Diagnosis present

## 2019-01-13 DIAGNOSIS — G4730 Sleep apnea, unspecified: Secondary | ICD-10-CM | POA: Diagnosis present

## 2019-01-13 DIAGNOSIS — I700 Atherosclerosis of aorta: Secondary | ICD-10-CM | POA: Diagnosis present

## 2019-01-13 DIAGNOSIS — K254 Chronic or unspecified gastric ulcer with hemorrhage: Secondary | ICD-10-CM | POA: Diagnosis present

## 2019-01-13 DIAGNOSIS — G8389 Other specified paralytic syndromes: Secondary | ICD-10-CM | POA: Diagnosis not present

## 2019-01-13 DIAGNOSIS — I5032 Chronic diastolic (congestive) heart failure: Secondary | ICD-10-CM | POA: Diagnosis present

## 2019-01-13 DIAGNOSIS — K922 Gastrointestinal hemorrhage, unspecified: Secondary | ICD-10-CM

## 2019-01-13 DIAGNOSIS — D696 Thrombocytopenia, unspecified: Secondary | ICD-10-CM | POA: Diagnosis present

## 2019-01-13 DIAGNOSIS — A419 Sepsis, unspecified organism: Secondary | ICD-10-CM | POA: Diagnosis not present

## 2019-01-13 DIAGNOSIS — Z803 Family history of malignant neoplasm of breast: Secondary | ICD-10-CM | POA: Diagnosis not present

## 2019-01-13 DIAGNOSIS — J690 Pneumonitis due to inhalation of food and vomit: Secondary | ICD-10-CM | POA: Diagnosis not present

## 2019-01-13 DIAGNOSIS — E559 Vitamin D deficiency, unspecified: Secondary | ICD-10-CM | POA: Diagnosis present

## 2019-01-13 DIAGNOSIS — Z8249 Family history of ischemic heart disease and other diseases of the circulatory system: Secondary | ICD-10-CM

## 2019-01-13 DIAGNOSIS — R471 Dysarthria and anarthria: Secondary | ICD-10-CM | POA: Diagnosis not present

## 2019-01-13 DIAGNOSIS — E876 Hypokalemia: Secondary | ICD-10-CM | POA: Diagnosis not present

## 2019-01-13 DIAGNOSIS — R29810 Facial weakness: Secondary | ICD-10-CM | POA: Diagnosis present

## 2019-01-13 DIAGNOSIS — G8191 Hemiplegia, unspecified affecting right dominant side: Secondary | ICD-10-CM | POA: Diagnosis present

## 2019-01-13 DIAGNOSIS — R6520 Severe sepsis without septic shock: Secondary | ICD-10-CM | POA: Diagnosis present

## 2019-01-13 DIAGNOSIS — M50223 Other cervical disc displacement at C6-C7 level: Secondary | ICD-10-CM | POA: Diagnosis not present

## 2019-01-13 DIAGNOSIS — R918 Other nonspecific abnormal finding of lung field: Secondary | ICD-10-CM | POA: Diagnosis not present

## 2019-01-13 DIAGNOSIS — I361 Nonrheumatic tricuspid (valve) insufficiency: Secondary | ICD-10-CM | POA: Diagnosis not present

## 2019-01-13 DIAGNOSIS — I6340 Cerebral infarction due to embolism of unspecified cerebral artery: Secondary | ICD-10-CM | POA: Diagnosis not present

## 2019-01-13 DIAGNOSIS — N179 Acute kidney failure, unspecified: Secondary | ICD-10-CM

## 2019-01-13 DIAGNOSIS — Z9884 Bariatric surgery status: Secondary | ICD-10-CM | POA: Diagnosis not present

## 2019-01-13 DIAGNOSIS — I130 Hypertensive heart and chronic kidney disease with heart failure and stage 1 through stage 4 chronic kidney disease, or unspecified chronic kidney disease: Secondary | ICD-10-CM | POA: Diagnosis not present

## 2019-01-13 DIAGNOSIS — R531 Weakness: Secondary | ICD-10-CM | POA: Diagnosis not present

## 2019-01-13 DIAGNOSIS — G8929 Other chronic pain: Secondary | ICD-10-CM | POA: Diagnosis present

## 2019-01-13 DIAGNOSIS — R7989 Other specified abnormal findings of blood chemistry: Secondary | ICD-10-CM

## 2019-01-13 DIAGNOSIS — Z7982 Long term (current) use of aspirin: Secondary | ICD-10-CM

## 2019-01-13 DIAGNOSIS — I5033 Acute on chronic diastolic (congestive) heart failure: Secondary | ICD-10-CM | POA: Diagnosis not present

## 2019-01-13 DIAGNOSIS — B955 Unspecified streptococcus as the cause of diseases classified elsewhere: Secondary | ICD-10-CM | POA: Diagnosis present

## 2019-01-13 DIAGNOSIS — I83891 Varicose veins of right lower extremities with other complications: Secondary | ICD-10-CM | POA: Diagnosis not present

## 2019-01-13 DIAGNOSIS — Z7901 Long term (current) use of anticoagulants: Secondary | ICD-10-CM | POA: Diagnosis not present

## 2019-01-13 DIAGNOSIS — Z87820 Personal history of traumatic brain injury: Secondary | ICD-10-CM | POA: Diagnosis not present

## 2019-01-13 DIAGNOSIS — R7881 Bacteremia: Secondary | ICD-10-CM | POA: Diagnosis not present

## 2019-01-13 DIAGNOSIS — I071 Rheumatic tricuspid insufficiency: Secondary | ICD-10-CM | POA: Diagnosis present

## 2019-01-13 DIAGNOSIS — R269 Unspecified abnormalities of gait and mobility: Secondary | ICD-10-CM | POA: Diagnosis not present

## 2019-01-13 DIAGNOSIS — I469 Cardiac arrest, cause unspecified: Secondary | ICD-10-CM | POA: Diagnosis not present

## 2019-01-13 DIAGNOSIS — M25559 Pain in unspecified hip: Secondary | ICD-10-CM

## 2019-01-13 DIAGNOSIS — Z87891 Personal history of nicotine dependence: Secondary | ICD-10-CM | POA: Diagnosis not present

## 2019-01-13 DIAGNOSIS — K920 Hematemesis: Secondary | ICD-10-CM | POA: Diagnosis not present

## 2019-01-13 DIAGNOSIS — E8771 Transfusion associated circulatory overload: Secondary | ICD-10-CM | POA: Diagnosis not present

## 2019-01-13 DIAGNOSIS — Z8679 Personal history of other diseases of the circulatory system: Secondary | ICD-10-CM

## 2019-01-13 DIAGNOSIS — M170 Bilateral primary osteoarthritis of knee: Secondary | ICD-10-CM | POA: Diagnosis present

## 2019-01-13 DIAGNOSIS — L03115 Cellulitis of right lower limb: Secondary | ICD-10-CM | POA: Diagnosis not present

## 2019-01-13 DIAGNOSIS — R1310 Dysphagia, unspecified: Secondary | ICD-10-CM | POA: Diagnosis present

## 2019-01-13 DIAGNOSIS — D62 Acute posthemorrhagic anemia: Secondary | ICD-10-CM | POA: Diagnosis not present

## 2019-01-13 DIAGNOSIS — I609 Nontraumatic subarachnoid hemorrhage, unspecified: Secondary | ICD-10-CM | POA: Diagnosis not present

## 2019-01-13 DIAGNOSIS — R000 Tachycardia, unspecified: Secondary | ICD-10-CM | POA: Diagnosis not present

## 2019-01-13 DIAGNOSIS — R609 Edema, unspecified: Secondary | ICD-10-CM

## 2019-01-13 DIAGNOSIS — I69398 Other sequelae of cerebral infarction: Secondary | ICD-10-CM | POA: Diagnosis not present

## 2019-01-13 DIAGNOSIS — M4802 Spinal stenosis, cervical region: Secondary | ICD-10-CM | POA: Diagnosis not present

## 2019-01-13 DIAGNOSIS — E875 Hyperkalemia: Secondary | ICD-10-CM | POA: Diagnosis not present

## 2019-01-13 DIAGNOSIS — I509 Heart failure, unspecified: Secondary | ICD-10-CM | POA: Diagnosis not present

## 2019-01-13 DIAGNOSIS — Z952 Presence of prosthetic heart valve: Secondary | ICD-10-CM

## 2019-01-13 DIAGNOSIS — D500 Iron deficiency anemia secondary to blood loss (chronic): Secondary | ICD-10-CM | POA: Diagnosis not present

## 2019-01-13 DIAGNOSIS — I6300 Cerebral infarction due to thrombosis of unspecified precerebral artery: Secondary | ICD-10-CM | POA: Diagnosis not present

## 2019-01-13 DIAGNOSIS — Z96641 Presence of right artificial hip joint: Secondary | ICD-10-CM | POA: Diagnosis not present

## 2019-01-13 DIAGNOSIS — N183 Chronic kidney disease, stage 3 (moderate): Secondary | ICD-10-CM | POA: Diagnosis not present

## 2019-01-13 DIAGNOSIS — Z888 Allergy status to other drugs, medicaments and biological substances status: Secondary | ICD-10-CM

## 2019-01-13 DIAGNOSIS — Z6837 Body mass index (BMI) 37.0-37.9, adult: Secondary | ICD-10-CM

## 2019-01-13 DIAGNOSIS — Z9181 History of falling: Secondary | ICD-10-CM

## 2019-01-13 DIAGNOSIS — A409 Streptococcal sepsis, unspecified: Principal | ICD-10-CM | POA: Diagnosis present

## 2019-01-13 DIAGNOSIS — Z820 Family history of epilepsy and other diseases of the nervous system: Secondary | ICD-10-CM | POA: Diagnosis not present

## 2019-01-13 DIAGNOSIS — R578 Other shock: Secondary | ICD-10-CM | POA: Diagnosis not present

## 2019-01-13 DIAGNOSIS — I350 Nonrheumatic aortic (valve) stenosis: Secondary | ICD-10-CM

## 2019-01-13 DIAGNOSIS — K7460 Unspecified cirrhosis of liver: Secondary | ICD-10-CM | POA: Diagnosis present

## 2019-01-13 DIAGNOSIS — R4182 Altered mental status, unspecified: Secondary | ICD-10-CM | POA: Diagnosis not present

## 2019-01-13 DIAGNOSIS — I6350 Cerebral infarction due to unspecified occlusion or stenosis of unspecified cerebral artery: Secondary | ICD-10-CM | POA: Diagnosis not present

## 2019-01-13 DIAGNOSIS — I4901 Ventricular fibrillation: Secondary | ICD-10-CM | POA: Diagnosis not present

## 2019-01-13 DIAGNOSIS — R600 Localized edema: Secondary | ICD-10-CM | POA: Diagnosis not present

## 2019-01-13 DIAGNOSIS — I249 Acute ischemic heart disease, unspecified: Secondary | ICD-10-CM | POA: Diagnosis not present

## 2019-01-13 DIAGNOSIS — D72823 Leukemoid reaction: Secondary | ICD-10-CM | POA: Diagnosis not present

## 2019-01-13 DIAGNOSIS — I4510 Unspecified right bundle-branch block: Secondary | ICD-10-CM | POA: Diagnosis not present

## 2019-01-13 DIAGNOSIS — E872 Acidosis: Secondary | ICD-10-CM | POA: Diagnosis not present

## 2019-01-13 DIAGNOSIS — I517 Cardiomegaly: Secondary | ICD-10-CM | POA: Diagnosis not present

## 2019-01-13 DIAGNOSIS — E785 Hyperlipidemia, unspecified: Secondary | ICD-10-CM | POA: Diagnosis present

## 2019-01-13 DIAGNOSIS — H0520 Unspecified exophthalmos: Secondary | ICD-10-CM | POA: Diagnosis present

## 2019-01-13 DIAGNOSIS — I340 Nonrheumatic mitral (valve) insufficiency: Secondary | ICD-10-CM | POA: Diagnosis not present

## 2019-01-13 DIAGNOSIS — K7689 Other specified diseases of liver: Secondary | ICD-10-CM | POA: Diagnosis not present

## 2019-01-13 DIAGNOSIS — I6349 Cerebral infarction due to embolism of other cerebral artery: Secondary | ICD-10-CM | POA: Diagnosis not present

## 2019-01-13 DIAGNOSIS — I110 Hypertensive heart disease with heart failure: Secondary | ICD-10-CM | POA: Diagnosis not present

## 2019-01-13 DIAGNOSIS — M19011 Primary osteoarthritis, right shoulder: Secondary | ICD-10-CM | POA: Diagnosis present

## 2019-01-13 DIAGNOSIS — M109 Gout, unspecified: Secondary | ICD-10-CM | POA: Diagnosis not present

## 2019-01-13 DIAGNOSIS — E1122 Type 2 diabetes mellitus with diabetic chronic kidney disease: Secondary | ICD-10-CM | POA: Diagnosis present

## 2019-01-13 LAB — URINALYSIS, ROUTINE W REFLEX MICROSCOPIC
BILIRUBIN URINE: NEGATIVE
GLUCOSE, UA: NEGATIVE mg/dL
KETONES UR: NEGATIVE mg/dL
LEUKOCYTE UA: NEGATIVE
NITRITE: NEGATIVE
PH: 5 (ref 5.0–8.0)
Protein, ur: >=300 mg/dL — AB
Specific Gravity, Urine: 1.019 (ref 1.005–1.030)

## 2019-01-13 LAB — CBC WITH DIFFERENTIAL/PLATELET
BAND NEUTROPHILS: 20 %
BASOS ABS: 0 10*3/uL (ref 0.0–0.1)
BASOS PCT: 0 %
Blasts: 0 %
EOS ABS: 0 10*3/uL (ref 0.0–0.5)
EOS PCT: 0 %
HEMATOCRIT: 37.4 % — AB (ref 39.0–52.0)
HEMOGLOBIN: 12.5 g/dL — AB (ref 13.0–17.0)
LYMPHS ABS: 0.8 10*3/uL (ref 0.7–4.0)
Lymphocytes Relative: 4 %
MCH: 23.7 pg — ABNORMAL LOW (ref 26.0–34.0)
MCHC: 33.4 g/dL (ref 30.0–36.0)
MCV: 71 fL — AB (ref 80.0–100.0)
METAMYELOCYTES PCT: 2 %
MONOS PCT: 4 %
Monocytes Absolute: 0.8 10*3/uL (ref 0.1–1.0)
Myelocytes: 1 %
NEUTROS ABS: 19.5 10*3/uL — AB (ref 1.7–7.7)
Neutrophils Relative %: 69 %
Other: 0 %
Platelets: 88 10*3/uL — ABNORMAL LOW (ref 150–400)
Promyelocytes Relative: 0 %
RBC: 5.27 MIL/uL (ref 4.22–5.81)
RDW: 15.4 % (ref 11.5–15.5)
WBC: 21.1 10*3/uL — ABNORMAL HIGH (ref 4.0–10.5)
nRBC: 0 % (ref 0.0–0.2)
nRBC: 0 /100 WBC

## 2019-01-13 LAB — COMPREHENSIVE METABOLIC PANEL
ALT: 30 U/L (ref 0–44)
AST: 53 U/L — AB (ref 15–41)
Albumin: 3.1 g/dL — ABNORMAL LOW (ref 3.5–5.0)
Alkaline Phosphatase: 115 U/L (ref 38–126)
Anion gap: 12 (ref 5–15)
BUN: 36 mg/dL — AB (ref 8–23)
CHLORIDE: 104 mmol/L (ref 98–111)
CO2: 19 mmol/L — AB (ref 22–32)
Calcium: 8.2 mg/dL — ABNORMAL LOW (ref 8.9–10.3)
Creatinine, Ser: 1.55 mg/dL — ABNORMAL HIGH (ref 0.61–1.24)
GFR calc Af Amer: 53 mL/min — ABNORMAL LOW (ref >60)
GFR, EST NON AFRICAN AMERICAN: 45 mL/min — AB (ref >60)
Glucose, Bld: 134 mg/dL — ABNORMAL HIGH (ref 70–99)
Potassium: 3.1 mmol/L — ABNORMAL LOW (ref 3.5–5.1)
SODIUM: 135 mmol/L (ref 135–145)
Total Bilirubin: 3.2 mg/dL — ABNORMAL HIGH (ref 0.3–1.2)
Total Protein: 7 g/dL (ref 6.5–8.1)

## 2019-01-13 LAB — BLOOD GAS, VENOUS
Acid-base deficit: 2.2 mmol/L — ABNORMAL HIGH (ref 0.0–2.0)
Bicarbonate: 22.7 mmol/L (ref 20.0–28.0)
FIO2: 21
O2 Saturation: 74.9 %
PATIENT TEMPERATURE: 37.7
PH VEN: 7.454 — AB (ref 7.250–7.430)
PO2 VEN: 41.8 mmHg (ref 32.0–45.0)
pCO2, Ven: 30.6 mmHg — ABNORMAL LOW (ref 44.0–60.0)

## 2019-01-13 LAB — LACTIC ACID, PLASMA
Lactic Acid, Venous: 3.2 mmol/L (ref 0.5–1.9)
Lactic Acid, Venous: 2.4 mmol/L (ref 0.5–1.9)

## 2019-01-13 LAB — INFLUENZA PANEL BY PCR (TYPE A & B)
Influenza A By PCR: NEGATIVE
Influenza B By PCR: NEGATIVE

## 2019-01-13 LAB — MAGNESIUM: Magnesium: 1.9 mg/dL (ref 1.7–2.4)

## 2019-01-13 MED ORDER — HYDROCODONE-ACETAMINOPHEN 5-325 MG PO TABS
1.0000 | ORAL_TABLET | Freq: Four times a day (QID) | ORAL | Status: DC | PRN
  Administered 2019-01-14 – 2019-01-15 (×2): 2 via ORAL
  Administered 2019-01-17 (×2): 1 via ORAL
  Filled 2019-01-13: qty 1
  Filled 2019-01-13 (×2): qty 2
  Filled 2019-01-13: qty 1

## 2019-01-13 MED ORDER — METOPROLOL TARTRATE 5 MG/5ML IV SOLN: 5.0000 mg | INTRAVENOUS | Status: DC | PRN

## 2019-01-13 MED ORDER — DILTIAZEM HCL ER COATED BEADS 120 MG PO CP24
120.0000 mg | ORAL_CAPSULE | Freq: Every day | ORAL | Status: DC
  Administered 2019-01-14 – 2019-01-21 (×8): 120 mg via ORAL
  Filled 2019-01-13 (×8): qty 1

## 2019-01-13 MED ORDER — SPIRONOLACTONE 12.5 MG HALF TABLET
12.5000 mg | ORAL_TABLET | Freq: Every day | ORAL | Status: DC
  Administered 2019-01-14 – 2019-01-21 (×7): 12.5 mg via ORAL
  Filled 2019-01-13 (×11): qty 1

## 2019-01-13 MED ORDER — ONDANSETRON HCL 4 MG PO TABS: 4.0000 mg | ORAL_TABLET | Freq: Four times a day (QID) | ORAL | Status: DC | PRN

## 2019-01-13 MED ORDER — SODIUM CHLORIDE 0.9 % IV BOLUS
1000.0000 mL | Freq: Once | INTRAVENOUS | Status: AC
  Administered 2019-01-13: 1000 mL via INTRAVENOUS

## 2019-01-13 MED ORDER — ATORVASTATIN CALCIUM 80 MG PO TABS
80.0000 mg | ORAL_TABLET | Freq: Every day | ORAL | Status: DC
  Administered 2019-01-14 – 2019-01-29 (×14): 80 mg via ORAL
  Filled 2019-01-13 (×2): qty 2
  Filled 2019-01-13: qty 1
  Filled 2019-01-13: qty 2
  Filled 2019-01-13: qty 1
  Filled 2019-01-13: qty 2
  Filled 2019-01-13 (×6): qty 1
  Filled 2019-01-13: qty 2
  Filled 2019-01-13: qty 1
  Filled 2019-01-13: qty 2

## 2019-01-13 MED ORDER — ACETAMINOPHEN 650 MG RE SUPP: 650.0000 mg | Freq: Four times a day (QID) | RECTAL | Status: DC | PRN

## 2019-01-13 MED ORDER — METOPROLOL TARTRATE 5 MG/5ML IV SOLN: 5.0000 mg | Freq: Once | INTRAVENOUS | Status: DC

## 2019-01-13 MED ORDER — POTASSIUM CHLORIDE IN NACL 40-0.9 MEQ/L-% IV SOLN
INTRAVENOUS | Status: DC
  Administered 2019-01-14: 75 mL/h via INTRAVENOUS
  Filled 2019-01-13 (×2): qty 1000

## 2019-01-13 MED ORDER — POTASSIUM CHLORIDE CRYS ER 20 MEQ PO TBCR
40.0000 meq | EXTENDED_RELEASE_TABLET | ORAL | Status: AC
  Administered 2019-01-13 – 2019-01-14 (×2): 40 meq via ORAL
  Filled 2019-01-13 (×2): qty 2

## 2019-01-13 MED ORDER — ONDANSETRON HCL 4 MG/2ML IJ SOLN
4.0000 mg | Freq: Four times a day (QID) | INTRAMUSCULAR | Status: DC | PRN
  Administered 2019-01-20 – 2019-01-24 (×5): 4 mg via INTRAVENOUS
  Filled 2019-01-13 (×6): qty 2

## 2019-01-13 MED ORDER — SODIUM CHLORIDE 0.9 % IV BOLUS
500.0000 mL | Freq: Once | INTRAVENOUS | Status: AC
  Administered 2019-01-13: 500 mL via INTRAVENOUS

## 2019-01-13 MED ORDER — POLYETHYLENE GLYCOL 3350 17 G PO PACK
17.0000 g | PACK | Freq: Every day | ORAL | Status: DC | PRN
  Filled 2019-01-13: qty 1

## 2019-01-13 MED ORDER — ACETAMINOPHEN 325 MG PO TABS
650.0000 mg | ORAL_TABLET | Freq: Once | ORAL | Status: AC
Start: 2019-01-13 — End: 2019-01-13
  Administered 2019-01-13: 650 mg via ORAL
  Filled 2019-01-13: qty 2

## 2019-01-13 MED ORDER — VANCOMYCIN HCL IN DEXTROSE 1-5 GM/200ML-% IV SOLN
1000.0000 mg | Freq: Once | INTRAVENOUS | Status: AC
  Administered 2019-01-14: 1000 mg via INTRAVENOUS
  Filled 2019-01-13: qty 200

## 2019-01-13 MED ORDER — ACETAMINOPHEN 325 MG PO TABS
650.0000 mg | ORAL_TABLET | Freq: Four times a day (QID) | ORAL | Status: DC | PRN
  Administered 2019-01-15: 650 mg via ORAL
  Filled 2019-01-13: qty 2

## 2019-01-13 MED ORDER — SODIUM CHLORIDE 0.9 % IV SOLN
2.0000 g | Freq: Once | INTRAVENOUS | Status: AC
  Administered 2019-01-13: 2 g via INTRAVENOUS
  Filled 2019-01-13: qty 2

## 2019-01-13 MED ORDER — METRONIDAZOLE IN NACL 5-0.79 MG/ML-% IV SOLN
500.0000 mg | Freq: Three times a day (TID) | INTRAVENOUS | Status: AC
  Administered 2019-01-13 – 2019-01-14 (×3): 500 mg via INTRAVENOUS
  Filled 2019-01-13 (×3): qty 100

## 2019-01-13 NOTE — ED Provider Notes (Signed)
[facility] EMERGENCY DEPARTMENT Provider Note   CSN: [id number] Arrival date & time: 01/13/19  1349    History   Chief Complaint Chief Complaint  Patient presents with  . Shoulder Pain    HPI Joel Rogers is a 68 y.o. male.     HPI   He presents for evaluation of generalized weakness, decreased oral intake, difficulty walking and sleepiness.  He is also reported some pain in his right shoulder.  There is been no cough, fever, chills, nausea or vomiting.  He is taking his usual medication.  Patient states "I feel like I have the flu."  There are no other known modifying factors.  Past Medical History:  Diagnosis Date  . Anemia    low iron  . Arthritis    bilateral knees  . Atrial fibrillation, chronic   . Chronic diastolic CHF (congestive heart failure) (HCC) 06/15/2018  . History of subdural hematoma   . Hyperlipidemia   . Hypertension   . Morbid obesity (HCC)   . Normal coronary arteries    by cardiac catheterization performed 03/14/06  . Obesity   . Pre-diabetes    pt denies  . S/P TAVR (transcatheter aortic valve replacement)    a. 07/25/18: Edwards Sapien 3 THV (size 26 mm, model # 9600TFX, serial # [id number]) by Dr. Laneta Simmers and Dr. Clifton James  . Sleep apnea    on CPAP  . Venous insufficiency     Patient Active Problem List   Diagnosis Date Noted  . S/P TAVR (transcatheter aortic valve replacement) 07/25/2018  . History of subdural hematoma   . Atrial fibrillation, chronic   . Acute on chronic diastolic heart failure (HCC) 06/15/2018  . Pulmonary hypertension, unspecified (HCC) 02/15/2018  . Varicose veins of right lower extremity with complications 01/10/2018  . Severe aortic stenosis 08/09/2017  . Vitamin D deficiency 12/02/2016  . Status post gastric bypass for obesity 11/02/2016  . HTN (hypertension) 04/13/2013  . Hyperlipemia 04/13/2013  . Chronic atrial fibrillation 02/14/2013  . Morbid obesity (HCC) 06/18/2008  . Obstructive sleep apnea 06/18/2008     Past Surgical History:  Procedure Laterality Date  . CRANIOTOMY Right 08/27/2016   Procedure: CRANIOTOMY HEMATOMA EVACUATION SUBDURAL;  Surgeon: Maeola Harman, MD;  Location: [facility] OR;  Service: Neurosurgery;  Laterality: Right;  . GASTRIC BYPASS  09/2008  . JOINT REPLACEMENT  08/31/2006   right hip  . MULTIPLE EXTRACTIONS WITH ALVEOLOPLASTY N/A 07/13/2018   Procedure: Extraction of tooth #'s 3,8,10, 23-26, 30and 32 with alveoloplasty and gross debridement of remaining teeth;  Surgeon: Charlynne Pander, DDS;  Location: [facility] OR;  Service: Oral Surgery;  Laterality: N/A;  . RIGHT HEART CATH N/A 07/06/2018   Procedure: RIGHT HEART CATH;  Surgeon: Kathleene Hazel, MD;  Location: [facility] INVASIVE CV LAB;  Service: Cardiovascular;  Laterality: N/A;  . RIGHT/LEFT HEART CATH AND CORONARY ANGIOGRAPHY N/A 07/10/2018   Procedure: RIGHT/LEFT HEART CATH AND CORONARY ANGIOGRAPHY;  Surgeon: Dolores Patty, MD;  Location: [facility] INVASIVE CV LAB;  Service: Cardiovascular;  Laterality: N/A;  . TEE WITHOUT CARDIOVERSION N/A 07/25/2018   Procedure: TRANSESOPHAGEAL ECHOCARDIOGRAM (TEE);  Surgeon: Kathleene Hazel, MD;  Location: [facility] OR;  Service: Open Heart Surgery;  Laterality: N/A;  . TOTAL HIP ARTHROPLASTY     right hip  . TRANSCATHETER AORTIC VALVE REPLACEMENT, TRANSFEMORAL  07/25/2018  . TRANSCATHETER AORTIC VALVE REPLACEMENT, TRANSFEMORAL N/A 07/25/2018   Procedure: TRANSCATHETER AORTIC VALVE REPLACEMENT, TRANSFEMORAL;  Surgeon: Kathleene Hazel, MD;  Location: [facility] OR;  Service: Open Heart Surgery;  Laterality: N/A;        Home Medications    Prior to Admission medications   Medication Sig Start Date End Date Taking? Authorizing Provider  acetaminophen (TYLENOL) 650 MG CR tablet Take 650 mg by mouth 2 (two) times daily as needed for pain.   Yes [provider]  amoxicillin (AMOXIL) 500 MG capsule Take 2,000 mg (4 tablets) one hour prior to dental visits. 09/12/18  Yes Janetta Hora, PA-C  apixaban (ELIQUIS) 5 MG TABS tablet Take 1 tablet (5 mg total) by mouth 2 (two) times daily. 07/27/18  Yes Janetta Hora, PA-C  aspirin 81 MG chewable tablet Chew 1 tablet (81 mg total) by mouth daily. 07/27/18  Yes Janetta Hora, PA-C  atorvastatin (LIPITOR) 80 MG tablet TAKE 1 TABLET BY MOUTH ONCE DAILY WITH  BREAKFAST 09/13/18  Yes Runell Gess, MD  colchicine 0.6 MG tablet TAKE 1 TABLET BY MOUTH TWICE DAILY 09/19/18  Yes Donita Brooks, MD  diltiazem (CARDIZEM CD) 120 MG 24 hr capsule Take 1 capsule (120 mg total) by mouth daily. 12/08/18  Yes , Velna Hatchet, MD  ergocalciferol (VITAMIN D2) 1.25 MG (50000 UT) capsule Take 50,000 Units by mouth once a week.   Yes [provider]  spironolactone (ALDACTONE) 25 MG tablet Take 0.5 tablets (12.5 mg total) by mouth daily. 07/16/18 07/16/19 Yes Barrett, Joline Salt, PA-C  torsemide (DEMADEX) 20 MG tablet Take 40 mg by mouth as needed. Use as needed if weight is over 245 pounds.   Yes [provider]    Family History Family History  Problem Relation Age of Onset  . Hypertension Father   . Cancer Father        prob prostate  . Alzheimer's disease Mother   . Alzheimer's disease Maternal Grandfather   . Breast cancer Sister   . Cancer Brother        "brain tumor"    Social History Social History   Tobacco Use  . Smoking status: Former Games developer  . Smokeless tobacco: Never Used  Substance Use Topics  . Alcohol use: No  . Drug use: No     Allergies   Patient has no known allergies.   Review of Systems Review of Systems  All other systems reviewed and are negative.    Physical Exam Updated Vital Signs BP (!) 151/84   Pulse (!) 116   Temp 97.9 F (36.6 C) (Oral)   Resp (!) 28   Ht 6' (1.829 m)   Wt 110.7 kg   SpO2 98%   BMI 33.09 kg/m   Physical Exam Vitals signs and nursing note reviewed.  Constitutional:      General: He is not in acute distress.    Appearance: He is  well-developed. He is ill-appearing. He is not toxic-appearing or diaphoretic.     Comments: Elderly, frail  HENT:     Head: Normocephalic and atraumatic.     Right Ear: External ear normal.     Left Ear: External ear normal.  Eyes:     Conjunctiva/sclera: Conjunctivae normal.     Pupils: Pupils are equal, round, and reactive to light.  Neck:     Musculoskeletal: Normal range of motion and neck supple.     Trachea: Phonation normal.  Cardiovascular:     Rate and Rhythm: Normal rate and regular rhythm.     Heart sounds: Normal heart sounds.  Pulmonary:     Effort: Pulmonary  effort is normal. No respiratory distress.     Breath sounds: Normal breath sounds. No stridor. No wheezing or rhonchi.  Chest:     Chest wall: No tenderness.  Abdominal:     Palpations: Abdomen is soft.     Tenderness: There is no abdominal tenderness.  Musculoskeletal: Normal range of motion.        General: Tenderness (Right shoulder, mild, he has difficulty extending the right shoulder above the clavicle level, secondary to pain) present. No deformity or signs of injury.     Right lower leg: No edema.     Left lower leg: No edema.  Skin:    General: Skin is warm and dry.  Neurological:     Mental Status: He is alert and oriented to person, place, and time.     Cranial Nerves: No cranial nerve deficit.     Sensory: No sensory deficit.     Motor: No abnormal muscle tone.     Coordination: Coordination normal.  Psychiatric:        Mood and Affect: Mood normal.        Behavior: Behavior normal.      ED Treatments / Results  Labs (all labs ordered are listed, but only abnormal results are displayed) Labs Reviewed  BLOOD GAS, VENOUS - Abnormal; Notable for the following components:      Result Value   pH, Ven 7.454 (*)    pCO2, Ven 30.6 (*)    Acid-base deficit 2.2 (*)    All other components within normal limits  COMPREHENSIVE METABOLIC PANEL - Abnormal; Notable for the following components:    Potassium 3.1 (*)    CO2 19 (*)    Glucose, Bld 134 (*)    BUN 36 (*)    Creatinine, Ser 1.55 (*)    Calcium 8.2 (*)    Albumin 3.1 (*)    AST 53 (*)    Total Bilirubin 3.2 (*)    GFR calc non Af Amer 45 (*)    GFR calc Af Amer 53 (*)    All other components within normal limits  CBC WITH DIFFERENTIAL/PLATELET - Abnormal; Notable for the following components:   WBC 21.1 (*)    Hemoglobin 12.5 (*)    HCT 37.4 (*)    MCV 71.0 (*)    MCH 23.7 (*)    Platelets 88 (*)    Neutro Abs 19.5 (*)    All other components within normal limits  URINALYSIS, ROUTINE W REFLEX MICROSCOPIC - Abnormal; Notable for the following components:   Color, Urine AMBER (*)    APPearance CLOUDY (*)    Hgb urine dipstick LARGE (*)    Protein, ur >=300 (*)    Bacteria, UA RARE (*)    All other components within normal limits  LACTIC ACID, PLASMA - Abnormal; Notable for the following components:   Lactic Acid, Venous 3.2 (*)    All other components within normal limits  CULTURE, BLOOD (ROUTINE X 2)  CULTURE, BLOOD (ROUTINE X 2)  URINE CULTURE  INFLUENZA PANEL BY PCR (TYPE A & B)  LACTIC ACID, PLASMA  BASIC METABOLIC PANEL  MAGNESIUM    EKG EKG Interpretation  Date/Time:  Saturday January 13 2019 16:08:01 EST Ventricular Rate:  124 PR Interval:    QRS Duration: 177 QT Interval:  369 QTC Calculation: 530 R Axis:   -10 Text Interpretation:  Atrial fibrillation Right bundle branch block Borderline ST depression, lateral leads Since last tracing rate faster Confirmed  by Mancel Bale [phone]) on 01/13/2019 5:57:40 PM   Radiology Dg Chest 2 View  Result Date: 01/13/2019 CLINICAL DATA:  Weakness.  Pain in shoulders. EXAM: CHEST - 2 VIEW COMPARISON:  07/25/2018 FINDINGS: Cardiomegaly. Prior aortic valve repair. No confluent airspace opacity, effusion or overt edema. No acute bony abnormality. IMPRESSION: Cardiomegaly.  No active disease. Electronically Signed   By: Charlett Nose M.D.   On: 01/13/2019  16:53   Dg Shoulder Right  Result Date: 01/13/2019 CLINICAL DATA:  Right shoulder pain, weakness EXAM: RIGHT SHOULDER - 2+ VIEW COMPARISON:  None. FINDINGS: Early subacromial spurring and spurring at the rotator cuff insertion. Slight joint space narrowing in the glenohumeral joint. No acute bony abnormality. Specifically, no fracture, subluxation, or dislocation. IMPRESSION: Early arthritic changes in the right shoulder. No acute bony abnormality. Electronically Signed   By: Charlett Nose M.D.   On: 01/13/2019 16:54    Procedures .Critical Care Performed by: Mancel Bale, MD Authorized by: Mancel Bale, MD   Critical care provider statement:    Critical care time (minutes):  50   Critical care start time:  01/13/2019 2:45 PM   Critical care end time:  01/13/2019 8:33 PM   Critical care time was exclusive of:  Separately billable procedures and treating other patients   Critical care was necessary to treat or prevent imminent or life-threatening deterioration of the following conditions:  Sepsis   Critical care was time spent personally by me on the following activities:  Blood draw for specimens, development of treatment plan with patient or surrogate, discussions with consultants, evaluation of patient's response to treatment, examination of patient, obtaining history from patient or surrogate, ordering and performing treatments and interventions, ordering and review of laboratory studies, pulse oximetry, re-evaluation of patient's condition, review of old charts and ordering and review of radiographic studies   (including critical care time)  Medications Ordered in ED Medications  sodium chloride 0.9 % bolus 1,000 mL (1,000 mLs Intravenous New Bag/Given 01/13/19 2004)  sodium chloride 0.9 % bolus 1,000 mL (1,000 mLs Intravenous New Bag/Given 01/13/19 2002)  metroNIDAZOLE (FLAGYL) IVPB 500 mg (500 mg Intravenous New Bag/Given 01/13/19 2004)  vancomycin (VANCOCIN) IVPB 1000 mg/200 mL premix  (has no administration in time range)    Followed by  vancomycin (VANCOCIN) IVPB 1000 mg/200 mL premix (has no administration in time range)  ceFEPIme (MAXIPIME) 2 g in sodium chloride 0.9 % 100 mL IVPB (has no administration in time range)  potassium chloride SA (K-DUR,KLOR-CON) CR tablet 40 mEq (has no administration in time range)  sodium chloride 0.9 % bolus 500 mL (0 mLs Intravenous Stopped 01/13/19 1742)  sodium chloride 0.9 % bolus 1,000 mL (0 mLs Intravenous Stopped 01/13/19 1919)  acetaminophen (TYLENOL) tablet 650 mg (650 mg Oral Given 01/13/19 1906)     Initial Impression / Assessment and Plan / ED Course  I have reviewed the triage vital signs and the nursing notes.  Pertinent labs & imaging results that were available during my care of the patient were reviewed by me and considered in my medical decision making (see chart for details).  Clinical Course as of Jan 14 2036  Sat Jan 13, 2019  1755 Normal except potassium low, CO2 low, glucose high, BUN high, creatinine high, calcium low, albumin low, AST high, total bilirubin high, GFR low  Comprehensive metabolic panel(!) [EW]  1756 Abnormal venous blood gas with elevated pH, and low CO2.  Blood gas, venous(!) [EW]  1756 Abnormal, white count elevated, hemoglobin  low, platelets low  CBC with Differential(!) [EW]  1756 DJD without fracture, images reviewed by me  DG Shoulder Right [EW]  1756 No CHF or infiltrate, images reviewed by me  DG Chest 2 View [EW]  1858 Normal except hemoglobin present, increase protein, 11-20 red cells.  Also abnormal, rare bacteria.  Urinalysis, Routine w reflex microscopic(!) [EW]  1858 Repeat temperature elevated, 102.1.  Treated with Tylenol.   [EW]  1859 Fever without clear source of infection, clinically, will screen for influenza.   [EW]  1931 Lactate is abated, 3.8.  Empiric antibiotics for sepsis without source started.  We will give 30 cc/kg bolus, and continue to assess and treat.   [EW]    2032 Elevated  Lactic acid, plasma(!!) [EW]    Clinical Course User Index [EW] Mancel Bale, MD        Patient Vitals for the past 24 hrs:  BP Temp Temp src Pulse Resp SpO2 Height Weight  01/13/19 2006 - 97.9 F (36.6 C) Oral - - - - -  01/13/19 2000 (!) 151/84 - - - (!) 28 - - -  01/13/19 1930 (!) 164/94 - - - (!) 34 - - -  01/13/19 1900 (!) 171/106 - - (!) 116 (!) 31 98 % - -  01/13/19 1830 (!) 154/93 - - - (!) 25 - - -  01/13/19 1800 (!) 156/97 - - (!) 118 (!) 29 100 % - -  01/13/19 1742 - (!) 102.1 F (38.9 C) Rectal - - - - -  01/13/19 1639 (!) 138/94 - - (!) 115 (!) 27 100 % - -  01/13/19 1356 (!) 149/92 100 F (37.8 C) Oral (!) 124 18 96 % 6' (1.829 m) 110.7 kg   Sepsis - Repeat Assessment  Performed at:    8:35 PM  Vitals     Blood pressure (!) 151/84, pulse (!) 116, temperature 97.9 F (36.6 C), temperature source Oral, resp. rate (!) 28, height 6' (1.829 m), weight 110.7 kg, SpO2 98 %.  Heart:     Tachycardic  Lungs:    CTA  Capillary Refill:   <2 sec  Peripheral Pulse:   Radial pulse palpable  Skin:     Normal Color    8:35 PM Reevaluation with update and discussion. After initial assessment and treatment, an updated evaluation reveals alert and comfortable at this time, findings discussed and questions answered. Mancel Bale   Medical Decision Making: Malaise, with fever.  Source of infection is not clear.  Patient is Sirs positive/sepsis without hypotension.  Nonspecific shoulder pain likely related to arthritis.  Doubt septic arthritis.  Patient will require monitoring and treatment in the inpatient setting.  Empiric antibiotics begun for possible occult bacterial infection.  Screening influenza testing negative.  CRITICAL CARE-yes Performed by: Mancel Bale  Nursing Notes Reviewed/ Care Coordinated Applicable Imaging Reviewed Interpretation of Laboratory Data incorporated into ED treatment  8:27 PM-Consult complete with hospitalist. Patient  case explained and discussed.  She agrees to admit patient for further evaluation and treatment. Call ended at 8:31 PM  Final Clinical Impressions(s) / ED Diagnoses   Final diagnoses:  Sepsis, due to unspecified organism, unspecified whether acute organ dysfunction present [facility])  Right shoulder pain, unspecified chronicity    ED Discharge Orders    None       Mancel Bale, MD 01/13/19 2038

## 2019-01-13 NOTE — ED Notes (Addendum)
Pt still in x-ray

## 2019-01-13 NOTE — ED Notes (Signed)
Date and time results received: 01/13/19 @19 :29 (use smartphrase ".now" to insert current time)  Test: lactic acid Critical Value: 3.2  Name of Provider Notified: Dr Effie Shy  Orders Received? Or Actions Taken?: none

## 2019-01-13 NOTE — ED Triage Notes (Signed)
Pt reports bilateral shoulder pain. Denies CP, SOB, fever, pain radiation, or injury. States he thinks he "slept on it wrong".

## 2019-01-13 NOTE — Progress Notes (Signed)
ANTIBIOTIC CONSULT NOTE-Preliminary  Pharmacy Consult for Vancomycin and Cefepime Indication: sepsis  No Known Allergies  Patient Measurements: Height: 6' (182.9 cm) Weight: 244 lb (110.7 kg) IBW/kg (Calculated) : 77.6  Vital Signs: Temp: 102.1 F (38.9 C) (02/22 1742) Temp Source: Rectal (02/22 1742) BP: 164/94 (02/22 1930) Pulse Rate: 116 (02/22 1900)  Labs: Recent Labs    01/13/19 1550  WBC 21.1*  HGB 12.5*  PLT 88*  CREATININE 1.55*    Estimated Creatinine Clearance: 58.6 mL/min (A) (by C-G formula based on SCr of 1.55 mg/dL (H)).  No results for input(s): VANCOTROUGH, VANCOPEAK, VANCORANDOM, GENTTROUGH, GENTPEAK, GENTRANDOM, TOBRATROUGH, TOBRAPEAK, TOBRARND, AMIKACINPEAK, AMIKACINTROU, AMIKACIN in the last 72 hours.   Microbiology: Recent Results (from the past 720 hour(s))  Culture, blood (routine x 2)     Status: None (Preliminary result)   Collection Time: 01/13/19  3:51 PM  Result Value Ref Range Status   Specimen Description BLOOD LEFT ARM  Final   Special Requests   Final    BOTTLES DRAWN AEROBIC ONLY Blood Culture adequate volume Performed at [facility], [address] [place], [name] [address]    Culture PENDING  Incomplete   Report Status PENDING  Incomplete    Medical History: Past Medical History:  Diagnosis Date  . Anemia    low iron  . Arthritis    bilateral knees  . Atrial fibrillation, chronic   . Chronic diastolic CHF (congestive heart failure) (HCC) 06/15/2018  . History of subdural hematoma   . Hyperlipidemia   . Hypertension   . Morbid obesity (HCC)   . Normal coronary arteries    by cardiac catheterization performed 03/14/06  . Obesity   . Pre-diabetes    pt denies  . S/P TAVR (transcatheter aortic valve replacement)    a. 07/25/18: Edwards Sapien 3 THV (size 26 mm, model # 9600TFX, serial # [id number]) by Dr. Laneta Simmers and Dr. Clifton James  . Sleep apnea    on CPAP  . Venous insufficiency     Medications:  (Not in a hospital  admission)   Assessment: 68 yo male in ED with sepsis.  Asked to initiate Vancomycin and Cefepime.  Goal of Therapy:  Vancomycin trough level 15-20 mcg/ml  Plan:  Preliminary review of pertinent patient information completed.  Protocol will be initiated with dose(s) of Vancomycin 2000mg  and Cefepime 2000mg .  Jeani Hawking clinical pharmacist will complete review during morning rounds to assess patient and finalize treatment regimen if needed.  Wayland Denis, [place] 01/13/2019,8:03 PM

## 2019-01-13 NOTE — ED Notes (Signed)
ED TO INPATIENT HANDOFF REPORT  Name/Age/Gender Joel Rogers 68 y.o. male  Code Status Code Status History    Date Active Date Inactive Code Status Order ID Comments User Context   07/25/2018 1128 07/26/2018 2025 Full Code [id number]  Allena Katz Inpatient   07/06/2018 1732 07/16/2018 1549 Full Code [id number]  Graciella Freer, PA-C Inpatient   07/06/2018 1732 07/06/2018 1732 Full Code [id number]  Kathleene Hazel, MD Inpatient   08/27/2016 0153 08/31/2016 2009 Full Code [id number]  Maeola Harman, MD Inpatient      Home/SNF/Other home  Chief Complaint Weakness  Level of Care/Admitting Diagnosis ED Disposition    ED Disposition Condition Comment   Admit  Hospital Area: [facility] [[id number]]  Level of Care: Telemetry [5]  Diagnosis: Sepsis [facility]) [[id number]]  Admitting Physician: Onnie Boer [[id number]]  Attending Physician: Onnie Boer [phone]  Estimated length of stay: past midnight tomorrow  Certification:: I certify this patient will need inpatient services for at least 2 midnights  PT Class (Do Not Modify): Inpatient [101]  PT Acc Code (Do Not Modify): Private [1]       Medical History Past Medical History:  Diagnosis Date  . Anemia    low iron  . Arthritis    bilateral knees  . Atrial fibrillation, chronic   . Chronic diastolic CHF (congestive heart failure) (HCC) 06/15/2018  . History of subdural hematoma   . Hyperlipidemia   . Hypertension   . Morbid obesity (HCC)   . Normal coronary arteries    by cardiac catheterization performed 03/14/06  . Obesity   . Pre-diabetes    pt denies  . S/P TAVR (transcatheter aortic valve replacement)    a. 07/25/18: Edwards Sapien 3 THV (size 26 mm, model # 9600TFX, serial # [id number]) by Dr. Laneta Simmers and Dr. Clifton James  . Sleep apnea    on CPAP  . Venous insufficiency     Allergies No Known Allergies  IV Location/Drains/Wounds Patient Lines/Drains/Airways Status   Active  Line/Drains/Airways    Name:   Placement date:   Placement time:   Site:   Days:   Peripheral IV 01/13/19 Right Forearm   01/13/19    1644    Forearm   less than 1   Incision (Closed) 07/25/18 Groin Left   07/25/18    0820     172   Incision (Closed) 07/25/18 Groin Right   07/25/18    0820     172   Wound / Incision (Open or Dehisced) 11/03/17 Venous stasis ulcer;Other (Comment) Leg Right;Anterior   11/03/17    1315    Leg   436          Labs/Imaging Results for orders placed or performed during the hospital encounter of 01/13/19 (from the past 48 hour(s))  Blood gas, venous     Status: Abnormal   Collection Time: 01/13/19  3:50 PM  Result Value Ref Range   FIO2 21.00    pH, Ven 7.454 (H) 7.250 - 7.430   pCO2, Ven 30.6 (L) 44.0 - 60.0 mmHg   pO2, Ven 41.8 32.0 - 45.0 mmHg   Bicarbonate 22.7 20.0 - 28.0 mmol/L   Acid-base deficit 2.2 (H) 0.0 - 2.0 mmol/L   O2 Saturation 74.9 %   Patient temperature 37.7     Comment: Performed at [facility], [address], [place], [name] [address]  Comprehensive metabolic panel     Status: Abnormal   Collection Time: 01/13/19  3:50 PM  Result Value Ref Range   Sodium 135 135 - 145 mmol/L   Potassium 3.1 (L) 3.5 - 5.1 mmol/L   Chloride 104 98 - 111 mmol/L   CO2 19 (L) 22 - 32 mmol/L   Glucose, Bld 134 (H) 70 - 99 mg/dL   BUN 36 (H) 8 - 23 mg/dL   Creatinine, Ser 9.511.55 (H) 0.61 - 1.24 mg/dL   Calcium 8.2 (L) 8.9 - 10.3 mg/dL   Total Protein 7.0 6.5 - 8.1 g/dL   Albumin 3.1 (L) 3.5 - 5.0 g/dL   AST 53 (H) 15 - 41 U/L   ALT 30 0 - 44 U/L   Alkaline Phosphatase 115 38 - 126 U/L   Total Bilirubin 3.2 (H) 0.3 - 1.2 mg/dL   GFR calc non Af Amer 45 (L) >60 mL/min   GFR calc Af Amer 53 (L) >60 mL/min   Anion gap 12 5 - 15    Comment: Performed at [facility], [address] [place], [place] [address]  CBC with Differential     Status: Abnormal   Collection Time: 01/13/19  3:50 PM  Result Value Ref Range   WBC 21.1 (H) 4.0 - 10.5 K/uL   RBC  5.27 4.22 - 5.81 MIL/uL   Hemoglobin 12.5 (L) 13.0 - 17.0 g/dL   HCT 60.637.4 (L) 30.139.0 - 60.152.0 %   MCV 71.0 (L) 80.0 - 100.0 fL   MCH 23.7 (L) 26.0 - 34.0 pg   MCHC 33.4 30.0 - 36.0 g/dL   RDW 09.315.4 23.511.5 - 57.315.5 %   Platelets 88 (L) 150 - 400 K/uL    Comment: PLATELET COUNT CONFIRMED BY SMEAR SPECIMEN CHECKED FOR CLOTS    nRBC 0.0 0.0 - 0.2 %   Neutrophils Relative % 69 %   Lymphocytes Relative 4 %   Monocytes Relative 4 %   Eosinophils Relative 0 %   Basophils Relative 0 %   Band Neutrophils 20 %   Metamyelocytes Relative 2 %   Myelocytes 1 %   Promyelocytes Relative 0 %   Blasts 0 %   nRBC 0 0 /100 WBC   Other 0 %   Neutro Abs 19.5 (H) 1.7 - 7.7 K/uL   Lymphs Abs 0.8 0.7 - 4.0 K/uL   Monocytes Absolute 0.8 0.1 - 1.0 K/uL   Eosinophils Absolute 0.0 0.0 - 0.5 K/uL   Basophils Absolute 0.0 0.0 - 0.1 K/uL   WBC Morphology MILD LEFT SHIFT (1-5% METAS, OCC MYELO, OCC BANDS)     Comment: TOXIC GRANULATION VACUOLATED NEUTROPHILS Performed at [facility], [address] [place], [place] [address]   Lactic acid, plasma     Status: Abnormal   Collection Time: 01/13/19  3:50 PM  Result Value Ref Range   Lactic Acid, Venous 3.2 (HH) 0.5 - 1.9 mmol/L    Comment: CRITICAL RESULT CALLED TO, READ BACK BY AND VERIFIED WITH: Jara Feider,M @ 1927 ON 01/13/19 BY JUW Performed at [facility], [address] [place], [place] [address]   Culture, blood (routine x 2)     Status: None (Preliminary result)   Collection Time: 01/13/19  3:51 PM  Result Value Ref Range   Specimen Description BLOOD LEFT ARM    Special Requests      BOTTLES DRAWN AEROBIC ONLY Blood Culture adequate volume Performed at [facility], [address] [place], [place] [address]    Culture PENDING    Report Status PENDING   Urinalysis, Routine w reflex microscopic  Status: Abnormal   Collection Time: 01/13/19  5:40 PM  Result Value Ref Range   Color, Urine AMBER (A) YELLOW    Comment: BIOCHEMICALS MAY BE AFFECTED BY  COLOR   APPearance CLOUDY (A) CLEAR   Specific Gravity, Urine 1.019 1.005 - 1.030   pH 5.0 5.0 - 8.0   Glucose, UA NEGATIVE NEGATIVE mg/dL   Hgb urine dipstick LARGE (A) NEGATIVE   Bilirubin Urine NEGATIVE NEGATIVE   Ketones, ur NEGATIVE NEGATIVE mg/dL   Protein, ur >=295 (A) NEGATIVE mg/dL   Nitrite NEGATIVE NEGATIVE   Leukocytes,Ua NEGATIVE NEGATIVE   RBC / HPF 11-20 0 - 5 RBC/hpf   WBC, UA 0-5 0 - 5 WBC/hpf   Bacteria, UA RARE (A) NONE SEEN   Squamous Epithelial / LPF 0-5 0 - 5   Mucus PRESENT    Hyaline Casts, UA PRESENT    Granular Casts, UA PRESENT    RBC Casts, UA PRESENT     Comment: Performed at [facility], [address] [place], [name] [address]  Influenza panel by PCR (type A & B)     Status: None   Collection Time: 01/13/19  7:20 PM  Result Value Ref Range   Influenza A By PCR NEGATIVE NEGATIVE   Influenza B By PCR NEGATIVE NEGATIVE    Comment: (NOTE) The Xpert Xpress Flu assay is intended as an aid in the diagnosis of  influenza and should not be used as a sole basis for treatment.  This  assay is FDA approved for nasopharyngeal swab specimens only. Nasal  washings and aspirates are unacceptable for Xpert Xpress Flu testing. Performed at [facility], [address] [place], [name] [address]   Culture, blood (routine x 2)     Status: None (Preliminary result)   Collection Time: 01/13/19  7:47 PM  Result Value Ref Range   Specimen Description BLOOD LEFT ARM    Special Requests      BOTTLES DRAWN AEROBIC AND ANAEROBIC Blood Culture adequate volume Performed at [facility], [address] [place], [name] [address]    Culture PENDING    Report Status PENDING   Lactic acid, plasma     Status: Abnormal   Collection Time: 01/13/19  7:47 PM  Result Value Ref Range   Lactic Acid, Venous 2.4 (HH) 0.5 - 1.9 mmol/L    Comment: CRITICAL RESULT CALLED TO, READ BACK BY AND VERIFIED WITH: NICKOLS,K @ 2037 ON 01/13/19 BY JUW Performed at [facility], [address], [place], [name] [address]   Magnesium     Status: None   Collection Time: 01/13/19  7:47 PM  Result Value Ref Range   Magnesium 1.9 1.7 - 2.4 mg/dL    Comment: Performed at [facility], [address], [place], [name] [address]   Dg Chest 2 View  Result Date: 01/13/2019 CLINICAL DATA:  Weakness.  Pain in shoulders. EXAM: CHEST - 2 VIEW COMPARISON:  07/25/2018 FINDINGS: Cardiomegaly. Prior aortic valve repair. No confluent airspace opacity, effusion or overt edema. No acute bony abnormality. IMPRESSION: Cardiomegaly.  No active disease. Electronically Signed   By: Charlett Nose M.D.   On: 01/13/2019 16:53   Dg Shoulder Right  Result Date: 01/13/2019 CLINICAL DATA:  Right shoulder pain, weakness EXAM: RIGHT SHOULDER - 2+ VIEW COMPARISON:  None. FINDINGS: Early subacromial spurring and spurring at the rotator cuff insertion. Slight joint space narrowing in the glenohumeral joint. No acute bony abnormality. Specifically, no fracture, subluxation, or dislocation. IMPRESSION: Early arthritic  changes in the right shoulder. No acute bony abnormality. Electronically Signed   By: Charlett Nose M.D.   On: 01/13/2019 16:54   EKG Interpretation  Date/Time:  Saturday January 13 2019 16:08:01 EST Ventricular Rate:  124 PR Interval:    QRS Duration: 177 QT Interval:  369 QTC Calculation: 530 R Axis:   -10 Text Interpretation:  Atrial fibrillation Right bundle branch block Borderline ST depression, lateral leads Since last tracing rate faster Confirmed by Mancel Bale [phone]) on 01/13/2019 5:57:40 PM   Pending Labs Unresulted Labs (From admission, onward)    Start     Ordered   01/14/19 0500  Basic metabolic panel  Tomorrow morning,   R     01/13/19 2002   01/13/19 2300  Lactic acid, plasma  Once,   R     01/13/19 2111   01/13/19 1933  Urine culture  ONCE - STAT,   STAT     01/13/19 1934   Signed and Held  CBC  Tomorrow morning,   R     Signed and Held          Vitals/Pain Today's Vitals    01/13/19 2006 01/13/19 2030 01/13/19 2100 01/13/19 2113  BP:  (!) 143/93 (!) 160/93   Pulse:   (!) 116   Resp:  (!) 27 (!) 25   Temp: 97.9 F (36.6 C)   98.2 F (36.8 C)  TempSrc: Oral   Oral  SpO2:   98%   Weight:      Height:      PainSc:        Isolation Precautions No active isolations  Medications Medications  metroNIDAZOLE (FLAGYL) IVPB 500 mg (0 mg Intravenous Stopped 01/13/19 2113)  vancomycin (VANCOCIN) IVPB 1000 mg/200 mL premix (has no administration in time range)    Followed by  vancomycin (VANCOCIN) IVPB 1000 mg/200 mL premix (has no administration in time range)  ceFEPIme (MAXIPIME) 2 g in sodium chloride 0.9 % 100 mL IVPB ( Intravenous Rate/Dose Verify 01/13/19 2129)  potassium chloride SA (K-DUR,KLOR-CON) CR tablet 40 mEq (40 mEq Oral Given 01/13/19 2116)  0.9 % NaCl with KCl 40 mEq / L  infusion (has no administration in time range)  sodium chloride 0.9 % bolus 500 mL (0 mLs Intravenous Stopped 01/13/19 1742)  sodium chloride 0.9 % bolus 1,000 mL (0 mLs Intravenous Stopped 01/13/19 1919)  acetaminophen (TYLENOL) tablet 650 mg (650 mg Oral Given 01/13/19 1906)  sodium chloride 0.9 % bolus 1,000 mL (1,000 mLs Intravenous New Bag/Given 01/13/19 2004)  sodium chloride 0.9 % bolus 1,000 mL ( Intravenous Rate/Dose Verify 01/13/19 2113)    Mobility ambulatory

## 2019-01-13 NOTE — ED Notes (Signed)
Date and time results received: 01/13/19 8:37 PM  (use smartphrase ".now" to insert current time)  Test: lactic 2.4   Name of Provider Notified: wentz  Orders Received? Or Actions Taken?: .

## 2019-01-13 NOTE — H&P (Addendum)
History and Physical    Joel Rogers PRX:[id number] DOB: 06-30-1951 DOA: 01/13/2019  PCP: [name], MD   Patient coming from: Home  I have personally briefly reviewed patient's old medical records in [facility]  Chief Complaint: Right Shoulder Pain  HPI: Joel Rogers is a 68 y.o. male with medical history significant for diastolic CHF, aortic stenosis status post TAVR, atrial fibrillation on anticoagulation, OSA, HTN, pulmonary hypertension, who presented to the ED with complaints of right shoulder pain over the past 3 to 4 days, but patient has chronic problems with his right shoulder and at baseline he is unable to move it around much.  He also has been feeling like he had the flu for the past 5 days. No cough or difficulty breathing, no chest pain, no cough, no vomiting, no abdominal pain, no diarrhea, no burning with urination, and no change in urinary frequency as he is on diuretics.  He was unaware of fevers until arrival in the ED.  No recent dental work.  Reports some headache, without neck pain or stiffness, confusion.  Spouse is at bedside-she noticed redness of his right lower extremity this morning, last night she helped him put on his compression stockings so she notes this is new.  Patient denies pain to his extremities.  ED Course: Temperature 102.1, tachycardic to 124, tachypneic to 34, blood pressure systolic 130s to 462M.  O2 sats greater than 98% on room air.  Leukocytosis 21, platelets 88.  Low potassium 3.1.  Oddly elevated creatinine 1.55.  Lactic acidosis 3.2.  UA rare bacteria, negative nitrites and leukocytes.  2 view chest x-ray negative for acute abnormality.  3.5 L bolus given.  Shoulder x-ray-early arthritic changes in the right shoulder, no acute bony abnormality.  Patient started on IV vancomycin metronidazole and cefepime.  Hospitalist to admit for sepsis source not identified.   Review of Systems: As per HPI all other systems reviewed and  negative.  Past Medical History:  Diagnosis Date  . Anemia    low iron  . Arthritis    bilateral knees  . Atrial fibrillation, chronic   . Chronic diastolic CHF (congestive heart failure) (HCC) 06/15/2018  . History of subdural hematoma   . Hyperlipidemia   . Hypertension   . Morbid obesity (HCC)   . Normal coronary arteries    by cardiac catheterization performed 03/14/06  . Obesity   . Pre-diabetes    pt denies  . S/P TAVR (transcatheter aortic valve replacement)    a. 07/25/18: Edwards Sapien 3 THV (size 26 mm, model # 9600TFX, serial # [id number]) by Dr. Laneta Simmers and Dr. Clifton James  . Sleep apnea    on CPAP  . Venous insufficiency     Past Surgical History:  Procedure Laterality Date  . CRANIOTOMY Right 08/27/2016   Procedure: CRANIOTOMY HEMATOMA EVACUATION SUBDURAL;  Surgeon: Maeola Harman, MD;  Location: [facility] OR;  Service: Neurosurgery;  Laterality: Right;  . GASTRIC BYPASS  09/2008  . JOINT REPLACEMENT  08/31/2006   right hip  . MULTIPLE EXTRACTIONS WITH ALVEOLOPLASTY N/A 07/13/2018   Procedure: Extraction of tooth #'s 3,8,10, 23-26, 30and 32 with alveoloplasty and gross debridement of remaining teeth;  Surgeon: Charlynne Pander, DDS;  Location: [facility] OR;  Service: Oral Surgery;  Laterality: N/A;  . RIGHT HEART CATH N/A 07/06/2018   Procedure: RIGHT HEART CATH;  Surgeon: Kathleene Hazel, MD;  Location: [facility] INVASIVE CV LAB;  Service: Cardiovascular;  Laterality: N/A;  . RIGHT/LEFT HEART  CATH AND CORONARY ANGIOGRAPHY N/A 07/10/2018   Procedure: RIGHT/LEFT HEART CATH AND CORONARY ANGIOGRAPHY;  Surgeon: Dolores Patty, MD;  Location: [facility] INVASIVE CV LAB;  Service: Cardiovascular;  Laterality: N/A;  . TEE WITHOUT CARDIOVERSION N/A 07/25/2018   Procedure: TRANSESOPHAGEAL ECHOCARDIOGRAM (TEE);  Surgeon: Kathleene Hazel, MD;  Location: [facility] OR;  Service: Open Heart Surgery;  Laterality: N/A;  . TOTAL HIP ARTHROPLASTY     right hip  . TRANSCATHETER AORTIC VALVE REPLACEMENT,  TRANSFEMORAL  07/25/2018  . TRANSCATHETER AORTIC VALVE REPLACEMENT, TRANSFEMORAL N/A 07/25/2018   Procedure: TRANSCATHETER AORTIC VALVE REPLACEMENT, TRANSFEMORAL;  Surgeon: Kathleene Hazel, MD;  Location: [facility] OR;  Service: Open Heart Surgery;  Laterality: N/A;     reports that he has quit smoking. He has never used smokeless tobacco. He reports that he does not drink alcohol or use drugs.  No Known Allergies  Family History  Problem Relation Age of Onset  . Hypertension Father   . Cancer Father        prob prostate  . Alzheimer's disease Mother   . Alzheimer's disease Maternal Grandfather   . Breast cancer Sister   . Cancer Brother        "brain tumor"    Prior to Admission medications   Medication Sig Start Date End Date Taking? Authorizing Provider  acetaminophen (TYLENOL) 650 MG CR tablet Take 650 mg by mouth 2 (two) times daily as needed for pain.   Yes [provider]  amoxicillin (AMOXIL) 500 MG capsule Take 2,000 mg (4 tablets) one hour prior to dental visits. 09/12/18  Yes Janetta Hora, PA-C  apixaban (ELIQUIS) 5 MG TABS tablet Take 1 tablet (5 mg total) by mouth 2 (two) times daily. 07/27/18  Yes Janetta Hora, PA-C  aspirin 81 MG chewable tablet Chew 1 tablet (81 mg total) by mouth daily. 07/27/18  Yes Janetta Hora, PA-C  atorvastatin (LIPITOR) 80 MG tablet TAKE 1 TABLET BY MOUTH ONCE DAILY WITH  BREAKFAST 09/13/18  Yes Runell Gess, MD  colchicine 0.6 MG tablet TAKE 1 TABLET BY MOUTH TWICE DAILY 09/19/18  Yes Donita Brooks, MD  diltiazem (CARDIZEM CD) 120 MG 24 hr capsule Take 1 capsule (120 mg total) by mouth daily. 12/08/18  Yes [place], Velna Hatchet, MD  ergocalciferol (VITAMIN D2) 1.25 MG (50000 UT) capsule Take 50,000 Units by mouth once a week.   Yes [provider]  spironolactone (ALDACTONE) 25 MG tablet Take 0.5 tablets (12.5 mg total) by mouth daily. 07/16/18 07/16/19 Yes Barrett, Joline Salt, PA-C  torsemide (DEMADEX) 20 MG  tablet Take 40 mg by mouth as needed. Use as needed if weight is over 245 pounds.   Yes [provider]    Physical Exam: Vitals:   01/13/19 1900 01/13/19 1930 01/13/19 2000 01/13/19 2006  BP: (!) 171/106 (!) 164/94 (!) 151/84   Pulse: (!) 116     Resp: (!) 31 (!) 34 (!) 28   Temp:    97.9 F (36.6 C)  TempSrc:    Oral  SpO2: 98%     Weight:      Height:        Constitutional: NAD, calm, comfortable Vitals:   01/13/19 1900 01/13/19 1930 01/13/19 2000 01/13/19 2006  BP: (!) 171/106 (!) 164/94 (!) 151/84   Pulse: (!) 116     Resp: (!) 31 (!) 34 (!) 28   Temp:    97.9 F (36.6 C)  TempSrc:    Oral  SpO2: 98%     Weight:      Height:       Eyes: PERRL, lids and conjunctivae normal ENMT: Mucous membranes are dry. Posterior pharynx clear of any exudate or lesions.  Neck: normal, supple, no masses, no thyromegaly Respiratory: clear to auscultation bilaterally, no wheezing, no crackles. Normal respiratory effort. No accessory muscle use.  Cardiovascular: Mild Tachycardia, irregular rate and rhythm, no murmurs / rubs / gallops.  Trace bilateral extremity edema right usually worse than left- chronic . 2+ pedal pulses.   Abdomen: no tenderness, no masses palpated. No hepatosplenomegaly. Bowel sounds positive.  Musculoskeletal: no clubbing / cyanosis.   No erythema, warmth or discoloration to bilateral shoulder joints.   But he is unable to move right shoulder above 45 degrees from his body.  Until now patient is unaware of right lower extremity redness, no pain, no tenderness on palpation, hard determine if there is differential warmth as he is febrile.  Skin: Right lower extremity redness.  No open sores or drainage. Neurologic: CN 2-12 grossly intact.  Moving all extremities spontaneously Psychiatric: Normal judgment and insight. Alert and oriented x 3. Normal mood.        Labs on Admission: I have personally reviewed following labs and imaging studies  CBC: Recent  Labs  Lab 01/13/19 1550  WBC 21.1*  NEUTROABS 19.5*  HGB 12.5*  HCT 37.4*  MCV 71.0*  PLT 88*   Basic Metabolic Panel: Recent Labs  Lab 01/13/19 1550  NA 135  K 3.1*  CL 104  CO2 19*  GLUCOSE 134*  BUN 36*  CREATININE 1.55*  CALCIUM 8.2*   Liver Function Tests: Recent Labs  Lab 01/13/19 1550  AST 53*  ALT 30  ALKPHOS 115  BILITOT 3.2*  PROT 7.0  ALBUMIN 3.1*   Urine analysis:    Component Value Date/Time   COLORURINE AMBER (A) 01/13/2019 1740   APPEARANCEUR CLOUDY (A) 01/13/2019 1740   LABSPEC 1.019 01/13/2019 1740   PHURINE 5.0 01/13/2019 1740   GLUCOSEU NEGATIVE 01/13/2019 1740   HGBUR LARGE (A) 01/13/2019 1740   BILIRUBINUR NEGATIVE 01/13/2019 1740   KETONESUR NEGATIVE 01/13/2019 1740   PROTEINUR >=300 (A) 01/13/2019 1740   UROBILINOGEN 1.0 05/23/2012 2101   NITRITE NEGATIVE 01/13/2019 1740   LEUKOCYTESUR NEGATIVE 01/13/2019 1740    Radiological Exams on Admission: Dg Chest 2 View  Result Date: 01/13/2019 CLINICAL DATA:  Weakness.  Pain in shoulders. EXAM: CHEST - 2 VIEW COMPARISON:  07/25/2018 FINDINGS: Cardiomegaly. Prior aortic valve repair. No confluent airspace opacity, effusion or overt edema. No acute bony abnormality. IMPRESSION: Cardiomegaly.  No active disease. Electronically Signed   By: Charlett Nose M.D.   On: 01/13/2019 16:53   Dg Shoulder Right  Result Date: 01/13/2019 CLINICAL DATA:  Right shoulder pain, weakness EXAM: RIGHT SHOULDER - 2+ VIEW COMPARISON:  None. FINDINGS: Early subacromial spurring and spurring at the rotator cuff insertion. Slight joint space narrowing in the glenohumeral joint. No acute bony abnormality. Specifically, no fracture, subluxation, or dislocation. IMPRESSION: Early arthritic changes in the right shoulder. No acute bony abnormality. Electronically Signed   By: Charlett Nose M.D.   On: 01/13/2019 16:54    EKG: Atrial fibrillation, rate 124, with old RBBB.  Elevated QTC 530.  Assessment/Plan Active  Problems:   Sepsis (HCC)  Sepsis-tachycardic, febrile 102, significant leukocytosis 21, tachypneic, stable blood pressure.  With lactic acidosis 3.2 > 2.4, S/o 3.5L bolus.  Etiology likely from new right lower extremity erythema-likely cellulitis, but  without tenderness. UA clean, chest x-ray two-view without acute abnormality.  No GI symptoms. -Continue broad-spectrum antibiotics for now-IV Vanco cefepime and metronidazole, pending clinical course IV ceftriaxone, no history of MRSA infection, no purulence. -Continue IV fluids N/s +40KCl 75cc/hr x 15hrs -Follow up blood  -Follow-up urine cultures -If bacteremic patient would require evaluation of his bioprosthetic aortic valve,also his right shoulder pain. -CBC, BMP -Trend lactic acid  Mild AKI- creatinine 1.5, baseline 1-1.2.  In the setting of sepsis, also with diuretic use - Hold Torsemide -Hydrate - BMP a.m  Prolonged QTC- 530.  Likely from hypokalemia 3.1 -Check Magnesium- 1.9 -Replete K - EKG a.m  A. fib with RVR-rates up to 124, improving.  Likely driven by sepsis physiology.  Anticoagulated. -Hold Eliquis for new thrombocytopenia platelets 88 -Continue home Cardizem - PRN IV metop for rates >120.  Thrombocytopenia-platelets 88.  Baseline >170.  Likely to related to sepsis. -CBC a.m. -Will hold anticoagulation, home 81 mg aspirin for now  Diastolic CHF-appears stable, last echo 08/2018, EF 65 to 70%.  -Home medications include torsemide PRN, , hold  TAVR for Aortic stenosis-follows with Dr. Allyson Sabal.  Doing well since surgery. - Will need evaluation if bacteremic  Right shoulder pain-acute on chronic. Likely progression of chronic pain, related to sepsis.  X-ray shows only arthritic changes in the right shoulder.  No acute bony abnormality.  Exam is benign. -May require reevaluation/imaging if bacteremic.  HTN-elevated. -Continue home spironolactone and Cardizem.   DVT prophylaxis: Scds for now Code Status:  Full Family Communication: Spouse at bedside Disposition Plan: Per rounding team Consults called: None Admission status: Inpt, tele   Onnie Boer MD [facility]  01/13/2019, 8:24 PM

## 2019-01-13 NOTE — ED Notes (Signed)
hospitalist at bedside,

## 2019-01-14 ENCOUNTER — Inpatient Hospital Stay (HOSPITAL_COMMUNITY): Payer: Medicare Other

## 2019-01-14 DIAGNOSIS — I482 Chronic atrial fibrillation, unspecified: Secondary | ICD-10-CM

## 2019-01-14 DIAGNOSIS — I83891 Varicose veins of right lower extremities with other complications: Secondary | ICD-10-CM

## 2019-01-14 DIAGNOSIS — G4733 Obstructive sleep apnea (adult) (pediatric): Secondary | ICD-10-CM

## 2019-01-14 DIAGNOSIS — M25511 Pain in right shoulder: Secondary | ICD-10-CM

## 2019-01-14 DIAGNOSIS — I272 Pulmonary hypertension, unspecified: Secondary | ICD-10-CM

## 2019-01-14 DIAGNOSIS — N179 Acute kidney failure, unspecified: Secondary | ICD-10-CM | POA: Diagnosis present

## 2019-01-14 DIAGNOSIS — Z9884 Bariatric surgery status: Secondary | ICD-10-CM

## 2019-01-14 DIAGNOSIS — I10 Essential (primary) hypertension: Secondary | ICD-10-CM

## 2019-01-14 LAB — BLOOD CULTURE ID PANEL (REFLEXED)
Acinetobacter baumannii: NOT DETECTED
CANDIDA GLABRATA: NOT DETECTED
Candida albicans: NOT DETECTED
Candida krusei: NOT DETECTED
Candida parapsilosis: NOT DETECTED
Candida tropicalis: NOT DETECTED
Enterobacter cloacae complex: NOT DETECTED
Enterobacteriaceae species: NOT DETECTED
Enterococcus species: NOT DETECTED
Escherichia coli: NOT DETECTED
Haemophilus influenzae: NOT DETECTED
Klebsiella oxytoca: NOT DETECTED
Klebsiella pneumoniae: NOT DETECTED
Listeria monocytogenes: NOT DETECTED
Neisseria meningitidis: NOT DETECTED
Proteus species: NOT DETECTED
Pseudomonas aeruginosa: NOT DETECTED
STREPTOCOCCUS PNEUMONIAE: NOT DETECTED
Serratia marcescens: NOT DETECTED
Staphylococcus aureus (BCID): NOT DETECTED
Staphylococcus species: NOT DETECTED
Streptococcus agalactiae: NOT DETECTED
Streptococcus pyogenes: NOT DETECTED
Streptococcus species: DETECTED — AB

## 2019-01-14 LAB — BASIC METABOLIC PANEL
Anion gap: 11 (ref 5–15)
BUN: 40 mg/dL — AB (ref 8–23)
CO2: 18 mmol/L — ABNORMAL LOW (ref 22–32)
Calcium: 8.1 mg/dL — ABNORMAL LOW (ref 8.9–10.3)
Chloride: 107 mmol/L (ref 98–111)
Creatinine, Ser: 1.57 mg/dL — ABNORMAL HIGH (ref 0.61–1.24)
GFR calc Af Amer: 52 mL/min — ABNORMAL LOW (ref >60)
GFR, EST NON AFRICAN AMERICAN: 45 mL/min — AB (ref >60)
GLUCOSE: 117 mg/dL — AB (ref 70–99)
Potassium: 3.8 mmol/L (ref 3.5–5.1)
Sodium: 136 mmol/L (ref 135–145)

## 2019-01-14 LAB — CBC
HCT: 36.4 % — ABNORMAL LOW (ref 39.0–52.0)
Hemoglobin: 12.2 g/dL — ABNORMAL LOW (ref 13.0–17.0)
MCH: 23.6 pg — ABNORMAL LOW (ref 26.0–34.0)
MCHC: 33.5 g/dL (ref 30.0–36.0)
MCV: 70.3 fL — ABNORMAL LOW (ref 80.0–100.0)
Platelets: 66 10*3/uL — ABNORMAL LOW (ref 150–400)
RBC: 5.18 MIL/uL (ref 4.22–5.81)
RDW: 15.6 % — ABNORMAL HIGH (ref 11.5–15.5)
WBC: 24.8 10*3/uL — AB (ref 4.0–10.5)
nRBC: 0 % (ref 0.0–0.2)

## 2019-01-14 LAB — LACTIC ACID, PLASMA
Lactic Acid, Venous: 1.5 mmol/L (ref 0.5–1.9)
Lactic Acid, Venous: 1.7 mmol/L (ref 0.5–1.9)
Lactic Acid, Venous: 2.6 mmol/L (ref 0.5–1.9)

## 2019-01-14 MED ORDER — VANCOMYCIN HCL 10 G IV SOLR
1500.0000 mg | INTRAVENOUS | Status: DC
  Administered 2019-01-14 – 2019-01-16 (×3): 1500 mg via INTRAVENOUS
  Filled 2019-01-14 (×4): qty 1500

## 2019-01-14 MED ORDER — MAGNESIUM SULFATE 2 GM/50ML IV SOLN
2.0000 g | Freq: Once | INTRAVENOUS | Status: AC
  Administered 2019-01-14: 2 g via INTRAVENOUS
  Filled 2019-01-14: qty 50

## 2019-01-14 MED ORDER — POTASSIUM CHLORIDE IN NACL 40-0.9 MEQ/L-% IV SOLN
INTRAVENOUS | Status: DC
  Administered 2019-01-14: 75 mL/h via INTRAVENOUS
  Administered 2019-01-15 – 2019-01-17 (×3): 50 mL/h via INTRAVENOUS

## 2019-01-14 MED ORDER — SODIUM CHLORIDE 0.9 % IV SOLN
1.0000 g | Freq: Two times a day (BID) | INTRAVENOUS | Status: DC
  Administered 2019-01-14 – 2019-01-15 (×3): 1 g via INTRAVENOUS
  Filled 2019-01-14 (×5): qty 1

## 2019-01-14 NOTE — Progress Notes (Signed)
PHARMACY - PHYSICIAN COMMUNICATION CRITICAL VALUE ALERT - BLOOD CULTURE IDENTIFICATION (BCID)  Joel Rogers is an 67 y.o. male who presented to [facility] on 01/13/2019 with a chief complaint of fever and cellulitis  Assessment:  Lab called with 1 of 2 bottles (+) for GPC chains  Name of physician (or Provider) Contacted: Dr Laural Benes  Current antibiotics: Vancomycin, Cefepime, Flagyl  Results for orders placed or performed during the hospital encounter of 01/13/19  Blood Culture ID Panel (Reflexed) (Collected: 01/13/2019  7:47 PM)  Result Value Ref Range   Enterococcus species NOT DETECTED NOT DETECTED   Listeria monocytogenes NOT DETECTED NOT DETECTED   Staphylococcus species NOT DETECTED NOT DETECTED   Staphylococcus aureus (BCID) NOT DETECTED NOT DETECTED   Streptococcus species DETECTED (A) NOT DETECTED   Streptococcus agalactiae NOT DETECTED NOT DETECTED   Streptococcus pneumoniae NOT DETECTED NOT DETECTED   Streptococcus pyogenes NOT DETECTED NOT DETECTED   Acinetobacter baumannii NOT DETECTED NOT DETECTED   Enterobacteriaceae species NOT DETECTED NOT DETECTED   Enterobacter cloacae complex NOT DETECTED NOT DETECTED   Escherichia coli NOT DETECTED NOT DETECTED   Klebsiella oxytoca NOT DETECTED NOT DETECTED   Klebsiella pneumoniae NOT DETECTED NOT DETECTED   Proteus species NOT DETECTED NOT DETECTED   Serratia marcescens NOT DETECTED NOT DETECTED   Haemophilus influenzae NOT DETECTED NOT DETECTED   Neisseria meningitidis NOT DETECTED NOT DETECTED   Pseudomonas aeruginosa NOT DETECTED NOT DETECTED   Candida albicans NOT DETECTED NOT DETECTED   Candida glabrata NOT DETECTED NOT DETECTED   Candida krusei NOT DETECTED NOT DETECTED   Candida parapsilosis NOT DETECTED NOT DETECTED   Candida tropicalis NOT DETECTED NOT DETECTED    Valrie Hart A 01/14/2019  10:39 AM

## 2019-01-14 NOTE — Progress Notes (Signed)
CRITICAL VALUE ALERT  Critical Value:  Blood culture one set -gram positive cocci and chains  Date & Time Notied:  01/14/2019 @0515   Provider Notified: Dr. Robb Matar  Orders Received/Actions taken: N/A

## 2019-01-14 NOTE — Progress Notes (Addendum)
Pharmacy Antibiotic Note  Joel Rogers is a 68 y.o. male admitted on 01/13/2019 with sepsis.  Pharmacy has been consulted for Vancomycin and Cefepime dosing.  Plan: Vancomycin 1500mg  IV every 24 hours.  Goal trough 15-20 mcg/mL.  Check trough at steady state Cefepime 1000mg  IV q12hrs Flagyl 500mg  IV Q8hrs (per MD) Monitor renal fxn and cultures  Height: 6' (182.9 cm) Weight: 244 lb (110.7 kg) IBW/kg (Calculated) : 77.6  Temp (24hrs), Avg:99.1 F (37.3 C), Min:97.9 F (36.6 C), Max:102.1 F (38.9 C)  Recent Labs  Lab 01/13/19 1550 01/13/19 1947 01/13/19 2337 01/14/19 0618  WBC 21.1*  --   --  24.8*  CREATININE 1.55*  --   --  1.57*  LATICACIDVEN 3.2* 2.4* 2.6* 1.7    Estimated Creatinine Clearance: 57.8 mL/min (A) (by C-G formula based on SCr of 1.57 mg/dL (H)).    No Known Allergies  Antimicrobials this admission: Cefepime 2/22 >>  Vancomycin 2/22 >>  Flagyl 2/22 >>  Dose adjustments this admission:  Microbiology results:  BCx: pending  UCx:    Sputum:    MRSA PCR:   Thank you for allowing pharmacy to be a part of this patient's care.  Valrie Hart A 01/14/2019 8:49 AM

## 2019-01-14 NOTE — Progress Notes (Signed)
Hold fluids per Dr. Robb Matar.  Patient has received a total of 4L IVF between bolus' and antibiotics.  Patient has history of CHF.

## 2019-01-14 NOTE — Progress Notes (Signed)
CRITICAL VALUE STICKER  CRITICAL VALUE: Lactate 2.6  RECEIVER (on-site recipient of call): Idelle Leech RN  DATE & TIME NOTIFIED: 01/14/19 0020  MESSENGER (representative from lab): Cathlean Cower  MD NOTIFIED: Rana Snare  [phone]  TIME OF NOTIFICATION: 01/14/19 0020  RESPONSE:  01/14/19 0020   Pt provider states pt has already received 4 L IVF and he would like to continue continuous IVF at current rate and reevaluate lactate in the morning.  Pt RN R Bondurant notified.

## 2019-01-14 NOTE — Progress Notes (Signed)
PROGRESS NOTE  Joel LeachRalph L Murry  ZOX:[id number]  DOB: 07-23-1951  DOA: 01/13/2019 PCP: Salley Scarleturham, Kawanta F, MD   Brief Admission Hx: 68 y/o male presented to the emergency department with right shoulder pain, fever tachycardia and cellulitis of the right lower extremity.  MDM/Assessment & Plan:   1. Severe sepsis- the patient was treated with a sepsis bundle and his lactic acid has normalized.  He is being treated aggressively with broad-spectrum antibiotics.  His blood cultures positive for gram-positive cocci in clusters.  Awaiting sensitivity and identification results.  Continue current treatments for now. 2. Gram-positive bacteremia- unfortunately he does have a bioprosthetic aortic valve and is now at high risk for seeding infection.  Planning to repeat blood cultures tomorrow.  Await ID and sensitivities will discuss with infectious disease physicians. 3. Acute renal failure- continue IV fluid hydration.  Check a bladder scan as urine output has been poor.  Catheterize if unable to void. 4. Chronic atrial fibrillation- temporarily holding Eliquis secondary to thrombocytopenia, continue Cardizem for rate control. 5. Thrombocytopenia- likely secondary to severe sepsis, platelet count has further declined to 66, no spontaneous bleeding, continue to follow.  Holding Eliquis temporarily. 6. Status post TAVR for severe aortic stenosis-he is followed by Dr. Gery PrayBarry.  He had been doing fairly well since surgery.  Unfortunately with this new bacteremia this will need to be further evaluated. 7. Acute on chronic right shoulder pain- plain film x-rays only showed arthritic changes in the right shoulder.  Continue to monitor closely and consider Ortho evaluation if persists. 8. Essential hypertension- blood pressures currently stable and controlled.  Follow.  DVT prophylaxis: SCDs Code Status: Full Family Communication: Patient updated at bedside Disposition Plan: Inpatient management for IV  antibiotics   Consultants:    Procedures:    Antimicrobials:  Cefepime 2/22  Vancomycin 2/22  Metronidazole 2/22 >2/13  Subjective: The patient reports that he continues to have pain in the right leg.  His shoulder feels better.  He has no chest pain or shortness of breath.  Objective: Vitals:   01/13/19 2100 01/13/19 2113 01/13/19 2222 01/14/19 0525  BP: (!) 160/93  (!) 145/85 117/86  Pulse: (!) 116  (!) 111 88  Resp: (!) 25   17  Temp:  98.2 F (36.8 C) 98 F (36.7 C) 98.2 F (36.8 C)  TempSrc:  Oral Oral Oral  SpO2: 98%  98% 98%  Weight:      Height:        Intake/Output Summary (Last 24 hours) at 01/14/2019 0936 Last data filed at 01/14/2019 [id number] Gross per 24 hour  Intake 1261.63 ml  Output -  Net 1261.63 ml   Filed Weights   01/13/19 1356  Weight: 110.7 kg     REVIEW OF SYSTEMS  As per history otherwise all reviewed and reported negative  Exam:  General exam: Awake, alert, no apparent distress, cooperative and pleasant. Respiratory system: Bilateral breath sounds no wheezes or crackles heard.  No increased work of breathing. Cardiovascular system: S1 & S2 heard. No JVD. Gastrointestinal system: Abdomen is nondistended, soft and nontender. Normal bowel sounds heard. Central nervous system: Alert and oriented. No focal neurological deficits. Extremities: Moderate cellulitis of the right lower extremity with scarring and edema noted in the right lower extremity, SCDs were removed for exam, pedal pulses palpated bilateral lower extremities and warm lower extremities bilaterally.  No edema noted in the right upper extremity and normal range of motion in both upper extremities.  Data Reviewed: Basic Metabolic  Panel: Recent Labs  Lab 01/13/19 1550 01/13/19 1947 01/14/19 0618  NA 135  --  136  K 3.1*  --  3.8  CL 104  --  107  CO2 19*  --  18*  GLUCOSE 134*  --  117*  BUN 36*  --  40*  CREATININE 1.55*  --  1.57*  CALCIUM 8.2*  --  8.1*  MG   --  1.9  --    Liver Function Tests: Recent Labs  Lab 01/13/19 1550  AST 53*  ALT 30  ALKPHOS 115  BILITOT 3.2*  PROT 7.0  ALBUMIN 3.1*   No results for input(s): LIPASE, AMYLASE in the last 168 hours. No results for input(s): AMMONIA in the last 168 hours. CBC: Recent Labs  Lab 01/13/19 1550 01/14/19 0618  WBC 21.1* 24.8*  NEUTROABS 19.5*  --   HGB 12.5* 12.2*  HCT 37.4* 36.4*  MCV 71.0* 70.3*  PLT 88* 66*   Cardiac Enzymes: No results for input(s): CKTOTAL, CKMB, CKMBINDEX, TROPONINI in the last 168 hours. CBG (last 3)  No results for input(s): GLUCAP in the last 72 hours. Recent Results (from the past 240 hour(s))  Culture, blood (routine x 2)     Status: None (Preliminary result)   Collection Time: 01/13/19  3:51 PM  Result Value Ref Range Status   Specimen Description BLOOD LEFT ARM  Final   Special Requests   Final    BOTTLES DRAWN AEROBIC ONLY Blood Culture adequate volume Performed at [facility], [address] [place], [name] [address]    Culture PENDING  Incomplete   Report Status PENDING  Incomplete  Culture, blood (routine x 2)     Status: None (Preliminary result)   Collection Time: 01/13/19  7:47 PM  Result Value Ref Range Status   Specimen Description BLOOD LEFT ARM  Final   Special Requests   Final    BOTTLES DRAWN AEROBIC AND ANAEROBIC Blood Culture adequate volume Performed at [facility], [address], [place], [name] [address]    Culture  Setup Time   Final    GRAM POSITIVE COCCI IN CHAINS Gram Stain Report Called to,Read Back By and Verified With: BOUDERANT,R @ 0516 ON 01/14/19 BY JUW GS DONE @ [facility] Organism ID to follow    Culture PENDING  Incomplete   Report Status PENDING  Incomplete     Studies: Dg Chest 2 View  Result Date: 01/13/2019 CLINICAL DATA:  Weakness.  Pain in shoulders. EXAM: CHEST - 2 VIEW COMPARISON:  07/25/2018 FINDINGS: Cardiomegaly. Prior aortic valve repair. No confluent airspace opacity, effusion or overt  edema. No acute bony abnormality. IMPRESSION: Cardiomegaly.  No active disease. Electronically Signed   By: Charlett Nose M.D.   On: 01/13/2019 16:53   Dg Shoulder Right  Result Date: 01/13/2019 CLINICAL DATA:  Right shoulder pain, weakness EXAM: RIGHT SHOULDER - 2+ VIEW COMPARISON:  None. FINDINGS: Early subacromial spurring and spurring at the rotator cuff insertion. Slight joint space narrowing in the glenohumeral joint. No acute bony abnormality. Specifically, no fracture, subluxation, or dislocation. IMPRESSION: Early arthritic changes in the right shoulder. No acute bony abnormality. Electronically Signed   By: Charlett Nose M.D.   On: 01/13/2019 16:54   Scheduled Meds: . atorvastatin  80 mg Oral q1800  . diltiazem  120 mg Oral Daily  . spironolactone  12.5 mg Oral Daily   Continuous Infusions: . 0.9 % NaCl with KCl 40 mEq / L 75 mL/hr (01/14/19 0853)  .  ceFEPime (MAXIPIME) IV    . metronidazole 500 mg (01/14/19 0405)  . vancomycin      Active Problems:   Obstructive sleep apnea   Chronic atrial fibrillation   HTN (hypertension)   Hyperlipemia   Status post gastric bypass for obesity   Vitamin D deficiency   History of Severe aortic stenosis   Varicose veins of right lower extremity with complications   Pulmonary hypertension, unspecified (HCC)   History of subdural hematoma   S/P TAVR (transcatheter aortic valve replacement)   Sepsis (HCC)   Acute renal failure (HCC)   Right shoulder pain  Time spent:   Standley Dakins, MD [facility] 01/14/2019, 9:36 AM    LOS: 1 day  How to contact the [facility] Attending or Consulting provider 7A - 7P or covering provider during after hours 7P -7A, for this patient?  1. Check the care team in [facility] and look for a) attending/consulting [facility] provider listed and b) the [facility] team listed 2. Log into [email or web address] and use [place]'s universal password to access. If you do not have the password, please contact the hospital  operator. 3. Locate the [facility] provider you are looking for under [facility] and page to a number that you can be directly reached. 4. If you still have difficulty reaching the provider, please page the [facility] (Director on Call) for the Hospitalists listed on amion for assistance.

## 2019-01-15 ENCOUNTER — Inpatient Hospital Stay (HOSPITAL_COMMUNITY): Payer: Medicare Other

## 2019-01-15 DIAGNOSIS — I361 Nonrheumatic tricuspid (valve) insufficiency: Secondary | ICD-10-CM

## 2019-01-15 DIAGNOSIS — I340 Nonrheumatic mitral (valve) insufficiency: Secondary | ICD-10-CM

## 2019-01-15 LAB — COMPREHENSIVE METABOLIC PANEL
ALK PHOS: 90 U/L (ref 38–126)
ALT: 29 U/L (ref 0–44)
AST: 43 U/L — ABNORMAL HIGH (ref 15–41)
Albumin: 2.3 g/dL — ABNORMAL LOW (ref 3.5–5.0)
Anion gap: 8 (ref 5–15)
BUN: 46 mg/dL — ABNORMAL HIGH (ref 8–23)
CALCIUM: 8.1 mg/dL — AB (ref 8.9–10.3)
CO2: 19 mmol/L — ABNORMAL LOW (ref 22–32)
Chloride: 112 mmol/L — ABNORMAL HIGH (ref 98–111)
Creatinine, Ser: 1.4 mg/dL — ABNORMAL HIGH (ref 0.61–1.24)
GFR calc Af Amer: 59 mL/min — ABNORMAL LOW (ref >60)
GFR calc non Af Amer: 51 mL/min — ABNORMAL LOW (ref >60)
Glucose, Bld: 99 mg/dL (ref 70–99)
Potassium: 4.2 mmol/L (ref 3.5–5.1)
Sodium: 139 mmol/L (ref 135–145)
TOTAL PROTEIN: 5.6 g/dL — AB (ref 6.5–8.1)
Total Bilirubin: 2 mg/dL — ABNORMAL HIGH (ref 0.3–1.2)

## 2019-01-15 LAB — CBC WITH DIFFERENTIAL/PLATELET
ABS IMMATURE GRANULOCYTES: 0.54 10*3/uL — AB (ref 0.00–0.07)
Basophils Absolute: 0.1 10*3/uL (ref 0.0–0.1)
Basophils Relative: 0 %
Eosinophils Absolute: 0.1 10*3/uL (ref 0.0–0.5)
Eosinophils Relative: 0 %
HCT: 34 % — ABNORMAL LOW (ref 39.0–52.0)
Hemoglobin: 11.6 g/dL — ABNORMAL LOW (ref 13.0–17.0)
Immature Granulocytes: 2 %
Lymphocytes Relative: 4 %
Lymphs Abs: 1.2 10*3/uL (ref 0.7–4.0)
MCH: 23.9 pg — ABNORMAL LOW (ref 26.0–34.0)
MCHC: 34.1 g/dL (ref 30.0–36.0)
MCV: 70 fL — AB (ref 80.0–100.0)
MONO ABS: 2.1 10*3/uL — AB (ref 0.1–1.0)
MONOS PCT: 7 %
Neutro Abs: 27.1 10*3/uL — ABNORMAL HIGH (ref 1.7–7.7)
Neutrophils Relative %: 87 %
Platelets: 62 10*3/uL — ABNORMAL LOW (ref 150–400)
RBC: 4.86 MIL/uL (ref 4.22–5.81)
RDW: 15.2 % (ref 11.5–15.5)
WBC Morphology: INCREASED
WBC: 31 10*3/uL — ABNORMAL HIGH (ref 4.0–10.5)
nRBC: 0.1 % (ref 0.0–0.2)

## 2019-01-15 LAB — ECHOCARDIOGRAM COMPLETE
Height: 72 in
Weight: 3904 oz

## 2019-01-15 LAB — MAGNESIUM: Magnesium: 2.8 mg/dL — ABNORMAL HIGH (ref 1.7–2.4)

## 2019-01-15 LAB — URINE CULTURE

## 2019-01-15 NOTE — Clinical Social Work Placement (Signed)
   CLINICAL SOCIAL WORK PLACEMENT  NOTE  Date:  01/15/2019  Patient Details  Name: Joel Rogers MRN: [id number] Date of Birth: Apr 02, 1951  Clinical Social Work is seeking post-discharge placement for this patient at the Skilled  Nursing Facility level of care (*CSW will initial, date and re-position this form in  chart as items are completed):  Yes   Patient/family provided with [facility] Clinical Social Work Department's list of facilities offering this level of care within the geographic area requested by the patient (or if unable, by the patient's family).  Yes   Patient/family informed of their freedom to choose among providers that offer the needed level of care, that participate in Medicare, Medicaid or managed care program needed by the patient, have an available bed and are willing to accept the patient.  Yes   Patient/family informed of [facility]'s ownership interest in [facility] and [facility], as well as of the fact that they are under no obligation to receive care at these facilities.  PASRR submitted to EDS on 01/15/19     PASRR number received on 01/15/19     Existing PASRR number confirmed on       FL2 transmitted to all facilities in geographic area requested by pt/family on 01/15/19     FL2 transmitted to all facilities within larger geographic area on       Patient informed that his/her managed care company has contracts with or will negotiate with certain facilities, including the following:            Patient/family informed of bed offers received.  Patient chooses bed at       Physician recommends and patient chooses bed at      Patient to be transferred to   on  .  Patient to be transferred to facility by       Patient family notified on   of transfer.  Name of family member notified:        PHYSICIAN       Additional Comment: Pt asked for referral to [facility] only.  Told him I needed back up plan.  He reluctantly agreed to referral to  [facility] as plan B.   _______________________________________________ Ida Rogue, LCSW 01/15/2019, 4:57 PM

## 2019-01-15 NOTE — Progress Notes (Signed)
*  PRELIMINARY RESULTS* Echocardiogram 2D Echocardiogram has been performed.  Joel Rogers 01/15/2019, 2:32 PM

## 2019-01-15 NOTE — Care Management Important Message (Signed)
Important Message  Patient Details  Name: Joel Rogers MRN: [id number] Date of Birth: [birth year]   Medicare Important Message Given:  Yes    Corey Harold 01/15/2019, 1:42 PM

## 2019-01-15 NOTE — NC FL2 (Signed)
[place] MEDICAID FL2 LEVEL OF CARE SCREENING TOOL     IDENTIFICATION  Patient Name: Joel Rogers Birthdate: 24-Sep-1951 Sex: male Admission Date (Current Location): 01/13/2019  [place] and [place] Number:  [employer] and Address:  [facility],  [address] [place] [address]      Provider Number: [id number]  Attending Physician Name and Address:  Cleora Fleet, MD  Relative Name and Phone Number:       Current Level of Care: Hospital Recommended Level of Care: Skilled Nursing Facility Prior Approval Number:    Date Approved/Denied:   PASRR Number: [id number]  Discharge Plan: SNF    Current Diagnoses: Patient Active Problem List   Diagnosis Date Noted  . Acute renal failure (HCC) 01/14/2019  . Right shoulder pain 01/14/2019  . Sepsis (HCC) 01/13/2019  . S/P TAVR (transcatheter aortic valve replacement) 07/25/2018  . History of subdural hematoma   . Atrial fibrillation, chronic   . Acute on chronic diastolic heart failure (HCC) 06/15/2018  . Pulmonary hypertension, unspecified (HCC) 02/15/2018  . Varicose veins of right lower extremity with complications 01/10/2018  . History of Severe aortic stenosis 08/09/2017  . Vitamin D deficiency 12/02/2016  . Status post gastric bypass for obesity 11/02/2016  . HTN (hypertension) 04/13/2013  . Hyperlipemia 04/13/2013  . Chronic atrial fibrillation 02/14/2013  . Morbid obesity (HCC) 06/18/2008  . Obstructive sleep apnea 06/18/2008    Orientation RESPIRATION BLADDER Height & Weight     Self, Time, Situation, Place  Normal Continent Weight: 110.7 kg Height:  6' (182.9 cm)  BEHAVIORAL SYMPTOMS/MOOD NEUROLOGICAL BOWEL NUTRITION STATUS  [place]anderer(none) (none) Continent Diet  AMBULATORY STATUS COMMUNICATION OF NEEDS Skin   Extensive Assist Verbally Normal                       Personal Care Assistance Level of Assistance  Bathing, Feeding, Dressing Bathing Assistance: Limited  assistance Feeding assistance: Independent Dressing Assistance: Limited assistance     Functional Limitations Info  Sight, Hearing, Speech Sight Info: Adequate Hearing Info: Adequate Speech Info: Adequate    SPECIAL CARE FACTORS FREQUENCY  PT (By licensed PT)     PT Frequency: 5X/W              Contractures Contractures Info: Not present    Additional Factors Info  Code Status, Allergies Code Status Info: Full Allergies Info: NKA           Current Medications (01/15/2019):  This is the current hospital active medication list Current Facility-Administered Medications  Medication Dose Route Frequency Provider Last Rate Last Dose  . 0.9 % NaCl with KCl 40 mEq / L  infusion   Intravenous Continuous Johnson, Clanford L, MD 50 mL/hr at 01/15/19 0426 50 mL/hr at 01/15/19 0426  . acetaminophen (TYLENOL) tablet 650 mg  650 mg Oral Q6H PRN Emokpae, Ejiroghene E, MD   650 mg at 01/15/19 0950   Or  . acetaminophen (TYLENOL) suppository 650 mg  650 mg Rectal Q6H PRN Emokpae, Ejiroghene E, MD      . atorvastatin (LIPITOR) tablet 80 mg  80 mg Oral q1800 Emokpae, Ejiroghene E, MD   80 mg at 01/14/19 1636  . diltiazem (CARDIZEM CD) 24 hr capsule 120 mg  120 mg Oral Daily Emokpae, Ejiroghene E, MD   120 mg at 01/15/19 1040  . HYDROcodone-acetaminophen (NORCO/VICODIN) 5-325 MG per tablet 1-2 tablet  1-2 tablet Oral Q6H PRN Bodenheimer, Bing Neighbors, NP  2 tablet at 01/15/19 1226  . metoprolol tartrate (LOPRESSOR) injection 5 mg  5 mg Intravenous Q2H PRN Emokpae, Ejiroghene E, MD      . ondansetron (ZOFRAN) tablet 4 mg  4 mg Oral Q6H PRN Emokpae, Ejiroghene E, MD       Or  . ondansetron (ZOFRAN) injection 4 mg  4 mg Intravenous Q6H PRN Emokpae, Ejiroghene E, MD      . polyethylene glycol (MIRALAX / GLYCOLAX) packet 17 g  17 g Oral Daily PRN Emokpae, Ejiroghene E, MD      . spironolactone (ALDACTONE) tablet 12.5 mg  12.5 mg Oral Daily Emokpae, Ejiroghene E, MD   12.5 mg at 01/15/19 1039  .  vancomycin (VANCOCIN) 1,500 mg in sodium chloride 0.9 % 500 mL IVPB  1,500 mg Intravenous Q24H Johnson, Clanford L, MD 250 mL/hr at 01/15/19 1221 1,500 mg at 01/15/19 1221     Discharge Medications: Please see discharge summary for a list of discharge medications.  Relevant Imaging Results:  Relevant Lab Results:   Additional Information [address] [place], [name]

## 2019-01-15 NOTE — Evaluation (Signed)
Physical Therapy Evaluation Patient Details Name: Joel Rogers MRN: [id number] DOB: 11-Feb-1951 Today's Date: 01/15/2019   History of Present Illness  Joel Rogers is a 68 y.o. male with medical history significant for diastolic CHF, aortic stenosis status post TAVR, atrial fibrillation on anticoagulation, OSA, HTN, pulmonary hypertension, who presented to the ED with complaints of right shoulder pain over the past 3 to 4 days, but patient has chronic problems with his right shoulder and at baseline he is unable to move it around much.  He also has been feeling like he had the flu for the past 5 days.    Clinical Impression  Patient very weak and at severe risk for falls, limited usage of RUE for bed mobility and sit to stands due to right shoulder pain, limited to a few unsteady side steps with frequent leaning backwards and difficulty fully extending trunk due to weakness.  Patient required bed slightly raised to complete sit to stands and tolerated sitting up in chair after therapy with spouse present in room - RN notified.  Patient will benefit from continued physical therapy in hospital and recommended venue below to increase strength, balance, endurance for safe ADLs and gait.    Follow Up Recommendations SNF    Equipment Recommendations  None recommended by PT    Recommendations for Other Services       Precautions / Restrictions Precautions Precautions: Fall Restrictions Weight Bearing Restrictions: No      Mobility  Bed Mobility Overal bed mobility: Needs Assistance Bed Mobility: Supine to Sit     Supine to sit: Mod assist;Max assist     General bed mobility comments: required head of bed raised, poor return for using RUE due to shoulder pain  Transfers Overall transfer level: Needs assistance Equipment used: Rolling walker (2 wheeled) Transfers: Sit to/from [employer] Sit to Stand: Mod assist;Max assist Stand pivot transfers: Mod assist;Max  assist       General transfer comment: slow labored movement, required bed raised for sit to stand  Ambulation/Gait Ambulation/Gait assistance: Max assist Gait Distance (Feet): 4 Feet Assistive device: Rolling walker (2 wheeled) Gait Pattern/deviations: Decreased step length - right;Decreased step length - left;Decreased stride length Gait velocity: slow   General Gait Details: limited to 7-8 slow labored side steps due to BLE weakness  Stairs            Wheelchair Mobility    Modified Rankin (Stroke Patients Only)       Balance Overall balance assessment: Needs assistance Sitting-balance support: Feet supported;No upper extremity supported Sitting balance-Leahy Scale: Fair     Standing balance support: Bilateral upper extremity supported;During functional activity Standing balance-Leahy Scale: Poor Standing balance comment: using RW                             Pertinent Vitals/Pain Pain Assessment: 0-10 Pain Score: 8  Pain Location: right shoulder Pain Descriptors / Indicators: Sore;Guarding;Grimacing Pain Intervention(s): Limited activity within patient's tolerance;Monitored during session    Home Living Family/patient expects to be discharged to:: Private residence Living Arrangements: Spouse/significant other Available Help at Discharge: Family;Available 24 hours/day Type of Home: House Home Access: Stairs to enter Entrance Stairs-Rails: Right Entrance Stairs-Number of Steps: 3 Home Layout: One level Home Equipment: Walker - 2 wheels;Cane - single point      Prior Function Level of Independence: Independent         Comments: community ambulator, drives  Hand Dominance   Dominant Hand: Right    Extremity/Trunk Assessment   Upper Extremity Assessment Upper Extremity Assessment: Generalized weakness;RUE deficits/detail;LUE deficits/detail RUE Deficits / Details: grossly 2+/5 LUE Deficits / Details: grossly -4/5    Lower  Extremity Assessment Lower Extremity Assessment: Generalized weakness    Cervical / Trunk Assessment Cervical / Trunk Assessment: Normal  Communication   Communication: No difficulties  Cognition Arousal/Alertness: Awake/alert Behavior During Therapy: WFL for tasks assessed/performed Overall Cognitive Status: Within Functional Limits for tasks assessed                                        General Comments      Exercises     Assessment/Plan    PT Assessment Patient needs continued PT services  PT Problem List Decreased strength;Decreased activity tolerance;Decreased balance;Decreased mobility       PT Treatment Interventions Gait training;Therapeutic exercise;Stair training;Functional mobility training;Therapeutic activities;Patient/family education    PT Goals (Current goals can be found in the Care Plan section)  Acute Rehab PT Goals Patient Stated Goal: return home after rehab PT Goal Formulation: With patient/family Time For Goal Achievement: 01/29/19 Potential to Achieve Goals: Good    Frequency Min 3X/week   Barriers to discharge        Co-evaluation               AM-PAC PT "6 Clicks" Mobility  Outcome Measure Help needed turning from your back to your side while in a flat bed without using bedrails?: A Lot Help needed moving from lying on your back to sitting on the side of a flat bed without using bedrails?: A Lot Help needed moving to and from a bed to a chair (including a wheelchair)?: A Lot Help needed standing up from a chair using your arms (e.g., wheelchair or bedside chair)?: A Lot Help needed to walk in hospital room?: Total Help needed climbing 3-5 steps with a railing? : Total 6 Click Score: 10    End of Session Equipment Utilized During Treatment: Gait belt Activity Tolerance: Patient tolerated treatment well;Patient limited by fatigue Patient left: in chair;with call bell/phone within reach Nurse Communication:  Mobility status PT Visit Diagnosis: Unsteadiness on feet (R26.81);Other abnormalities of gait and mobility (R26.89);Muscle weakness (generalized) (M62.81)    Time: 0076-2263 PT Time Calculation (min) (ACUTE ONLY): 30 min   Charges:   PT Evaluation $PT Eval Moderate Complexity: 1 Mod PT Treatments $Therapeutic Activity: 23-37 mins        3:33 PM, 01/15/19 [name], MPT Physical Therapist with [facility] [phone] office [phone] mobile phone

## 2019-01-15 NOTE — Plan of Care (Signed)
  Problem: Acute Rehab PT Goals(only PT should resolve) Goal: Pt Will Go Supine/Side To Sit Outcome: Progressing Flowsheets (Taken 01/15/2019 1534) Pt will go Supine/Side to Sit: with minimal assist; with moderate assist Goal: Patient Will Transfer Sit To/From Stand Outcome: Progressing Flowsheets (Taken 01/15/2019 1534) Patient will transfer sit to/from stand: with moderate assist Goal: Pt Will Transfer Bed To Chair/Chair To Bed Outcome: Progressing Flowsheets (Taken 01/15/2019 1534) Pt will Transfer Bed to Chair/Chair to Bed: with mod assist Goal: Pt Will Ambulate Outcome: Progressing Flowsheets (Taken 01/15/2019 1534) Pt will Ambulate: with moderate assist; with rolling walker; 25 feet   3:34 PM, 01/15/19 [name], MPT Physical Therapist with [facility] [phone] office [phone] mobile phone

## 2019-01-15 NOTE — Progress Notes (Signed)
PROGRESS NOTE  Joel Rogers  ZOX:[id number]  DOB: 12/24/1950  DOA: 01/13/2019 PCP: [name], MD  Brief Admission Hx: 68 y/o male presented to the emergency department with right shoulder pain, fever tachycardia and cellulitis of the right lower extremity.  MDM/Assessment & Plan:   1. Severe sepsis- Resolved now.  The patient was treated with sepsis bundle and his lactic acid has normalized.  De-escalating antibiotics.  His blood cultures positive for strep species.  Awaiting sensitivity and identification results.  Continue vancomycin. 2. Streptococcus bacteremia- unfortunately he does have a bioprosthetic aortic valve and is now at risk for vegetations . Repeating blood cultures 2/24.  Check 2D echocardiogram.  Await ID and sensitivities and would discuss with infectious disease physicians. 3. Acute renal failure- creatinine slowly improving with gentle IV fluid hydration.  Check a bladder scan as urine output has been poor.  Catheterize if unable to void. 4. Cellulitis of RLE - suspect streptococcus infection, clinically looking better, [place] RLE negative for DVT.  5. Chronic atrial fibrillation- temporarily holding Eliquis secondary to thrombocytopenia, continue Cardizem for rate control. 6. Thrombocytopenia- likely secondary to severe sepsis, platelet count has further declined to 66, no spontaneous bleeding, continue to follow.  Holding Eliquis temporarily. 7. Leukocytosis - marked bump in WBC - clinically patient is improving, check CXR, repeat blood cultures, follow CBC/diff.  8. Status post TAVR for severe aortic stenosis-he is followed by Dr. Gery Pray.  He had been doing fairly well since surgery.  Unfortunately with this new bacteremia this will need to be further evaluated and may need TEE. 9. Acute on chronic right shoulder pain- plain film x-rays only showed arthritic changes in the right shoulder.  Continue to monitor closely and consider Ortho evaluation if persists.  He  reports that pain is improving.   PT eval requested.   10. Essential hypertension- blood pressures currently stable and controlled.  Follow.  DVT prophylaxis: SCDs Code Status: Full Family Communication: Patient updated at bedside Disposition Plan: Inpatient management for IV antibiotics  Consultants:    Procedures:    Antimicrobials:  Cefepime 2/22-2/24  Vancomycin 2/22 >>  Metronidazole 2/22 >2/13  Subjective: The patient says that he wants to get up and ambulate more.    Objective: Vitals:   01/14/19 1402 01/14/19 2305 01/15/19 0507 01/15/19 0900  BP: 120/83 125/75 134/84 109/82  Pulse: 96 89 83 81  Resp: Temp: 97.8 F (36.6 C) 98.5 F (36.9 C) 97.7 F (36.5 C) 98.6 F (37 C)  TempSrc: Oral Oral Oral Oral  SpO2: 98% 97% 98% 99%  Weight:      Height:        Intake/Output Summary (Last 24 hours) at 01/15/2019 1041 Last data filed at 01/15/2019 1007 Gross per 24 hour  Intake 1555.94 ml  Output 1500 ml  Net 55.94 ml   Filed Weights   01/13/19 1356  Weight: 110.7 kg   REVIEW OF SYSTEMS  As per history otherwise all reviewed and reported negative  Exam:  General exam: Awake, alert, no apparent distress, cooperative and pleasant. Respiratory system: Bilateral breath sounds no wheezes or crackles heard.  No increased work of breathing. Cardiovascular system: S1 & S2 heard. No JVD. Gastrointestinal system: Abdomen is nondistended, soft and nontender. Normal bowel sounds heard. Central nervous system: Alert and oriented. No focal neurological deficits. Extremities: Moderate cellulitis of the right lower extremity with scarring and edema noted in the right lower extremity, SCDs were removed for exam, pedal  pulses palpated bilateral lower extremities and warm lower extremities bilaterally.  No edema noted in the right upper extremity and normal range of motion in both upper extremities.     Data Reviewed: Basic Metabolic Panel: Recent Labs    Lab 01/13/19 1550 01/13/19 1947 01/14/19 0618 01/15/19 0415  NA 135  --  136 139  K 3.1*  --  3.8 4.2  CL 104  --  107 112*  CO2 19*  --  18* 19*  GLUCOSE 134*  --  117* 99  BUN 36*  --  40* 46*  CREATININE 1.55*  --  1.57* 1.40*  CALCIUM 8.2*  --  8.1* 8.1*  MG  --  1.9  --  2.8*   Liver Function Tests: Recent Labs  Lab 01/13/19 1550 01/15/19 0415  AST 53* 43*  ALT 30 29  ALKPHOS 115 90  BILITOT 3.2* 2.0*  PROT 7.0 5.6*  ALBUMIN 3.1* 2.3*   No results for input(s): LIPASE, AMYLASE in the last 168 hours. No results for input(s): AMMONIA in the last 168 hours. CBC: Recent Labs  Lab 01/13/19 1550 01/14/19 0618 01/15/19 0415  WBC 21.1* 24.8* 31.0*  NEUTROABS 19.5*  --  27.1*  HGB 12.5* 12.2* 11.6*  HCT 37.4* 36.4* 34.0*  MCV 71.0* 70.3* 70.0*  PLT 88* 66* 62*   Cardiac Enzymes: No results for input(s): CKTOTAL, CKMB, CKMBINDEX, TROPONINI in the last 168 hours. CBG (last 3)  No results for input(s): GLUCAP in the last 72 hours. Recent Results (from the past 240 hour(s))  Culture, blood (routine x 2)     Status: None (Preliminary result)   Collection Time: 01/13/19  3:51 PM  Result Value Ref Range Status   Specimen Description BLOOD LEFT ARM  Final   Special Requests   Final    BOTTLES DRAWN AEROBIC ONLY Blood Culture adequate volume   Culture   Final    NO GROWTH 2 DAYS Performed at [facility], [address] [place], [name] [address]    Report Status PENDING  Incomplete  Urine culture     Status: Abnormal   Collection Time: 01/13/19  5:40 PM  Result Value Ref Range Status   Specimen Description   Final    URINE, CATHETERIZED Performed at [facility], [address] [place], [name] [address]    Special Requests   Final    NONE Performed at [facility], [address], [place], [name] [address]    Culture (A)  Final    <10,000 COLONIES/mL INSIGNIFICANT GROWTH Performed at [facility], [address], [place], [name] [address]     Report Status 01/15/2019 FINAL  Final  Culture, blood (routine x 2)     Status: None (Preliminary result)   Collection Time: 01/13/19  7:47 PM  Result Value Ref Range Status   Specimen Description   Final    BLOOD LEFT ARM Performed at [facility], [address], [place], [name] [address]    Special Requests   Final    BOTTLES DRAWN AEROBIC AND ANAEROBIC Blood Culture adequate volume Performed at [facility], [address], [place], [name] [address]    Culture  Setup Time   Final    GRAM POSITIVE COCCI IN CHAINS Gram Stain Report Called to,Read Back By and Verified With: BOUDERANT,R @ 0516 ON 01/14/19 BY JUW GS DONE @ [facility] CRITICAL RESULT CALLED TO, READ BACK BY AND VERIFIED WITH: S. HALL, PHARMD ([facility]) AT 1010 ON 01/14/19 BY  C. JESSUP, MLT. Performed at [facility], [address] [place], [name] [address]    Culture GRAM POSITIVE COCCI  Final   Report Status PENDING  Incomplete  Blood Culture ID Panel (Reflexed)     Status: Abnormal   Collection Time: 01/13/19  7:47 PM  Result Value Ref Range Status   Enterococcus species NOT DETECTED NOT DETECTED Final   Listeria monocytogenes NOT DETECTED NOT DETECTED Final   Staphylococcus species NOT DETECTED NOT DETECTED Final   Staphylococcus aureus (BCID) NOT DETECTED NOT DETECTED Final   Streptococcus species DETECTED (A) NOT DETECTED Final    Comment: Not Enterococcus species, Streptococcus agalactiae, Streptococcus pyogenes, or Streptococcus pneumoniae. CRITICAL RESULT CALLED TO, READ BACK BY AND VERIFIED WITH: S. HALL, PHARMD ([facility]) AT 1010 ON 01/14/19 BY C. JESSUP, MLT.    Streptococcus agalactiae NOT DETECTED NOT DETECTED Final   Streptococcus pneumoniae NOT DETECTED NOT DETECTED Final   Streptococcus pyogenes NOT DETECTED NOT DETECTED Final   Acinetobacter baumannii NOT DETECTED NOT DETECTED Final   Enterobacteriaceae species NOT DETECTED NOT DETECTED Final   Enterobacter cloacae complex NOT DETECTED NOT DETECTED Final    Escherichia coli NOT DETECTED NOT DETECTED Final   Klebsiella oxytoca NOT DETECTED NOT DETECTED Final   Klebsiella pneumoniae NOT DETECTED NOT DETECTED Final   Proteus species NOT DETECTED NOT DETECTED Final   Serratia marcescens NOT DETECTED NOT DETECTED Final   Haemophilus influenzae NOT DETECTED NOT DETECTED Final   Neisseria meningitidis NOT DETECTED NOT DETECTED Final   Pseudomonas aeruginosa NOT DETECTED NOT DETECTED Final   Candida albicans NOT DETECTED NOT DETECTED Final   Candida glabrata NOT DETECTED NOT DETECTED Final   Candida krusei NOT DETECTED NOT DETECTED Final   Candida parapsilosis NOT DETECTED NOT DETECTED Final   Candida tropicalis NOT DETECTED NOT DETECTED Final    Comment: Performed at [facility], [address], [place], [name] [address]     Studies: Dg Chest 2 View  Result Date: 01/13/2019 CLINICAL DATA:  Weakness.  Pain in shoulders. EXAM: CHEST - 2 VIEW COMPARISON:  07/25/2018 FINDINGS: Cardiomegaly. Prior aortic valve repair. No confluent airspace opacity, effusion or overt edema. No acute bony abnormality. IMPRESSION: Cardiomegaly.  No active disease. Electronically Signed   By: Charlett Nose M.D.   On: 01/13/2019 16:53   Dg Shoulder Right  Result Date: 01/13/2019 CLINICAL DATA:  Right shoulder pain, weakness EXAM: RIGHT SHOULDER - 2+ VIEW COMPARISON:  None. FINDINGS: Early subacromial spurring and spurring at the rotator cuff insertion. Slight joint space narrowing in the glenohumeral joint. No acute bony abnormality. Specifically, no fracture, subluxation, or dislocation. IMPRESSION: Early arthritic changes in the right shoulder. No acute bony abnormality. Electronically Signed   By: Charlett Nose M.D.   On: 01/13/2019 16:54   US Venous Img Lower Unilateral Right  Result Date: 01/14/2019 CLINICAL DATA:  Right lower extremity edema and erythema for 1 day EXAM: RIGHT LOWER EXTREMITY VENOUS DUPLEX ULTRASOUND TECHNIQUE: Doppler venous assessment of the  right lower extremity deep venous system was performed, including characterization of spectral flow, compressibility, and phasicity. COMPARISON:  None. FINDINGS: There is complete compressibility of the right common femoral, femoral, and popliteal veins. Doppler analysis demonstrates respiratory phasicity and augmentation of flow with calf compression. No obvious superficial vein or calf vein thrombosis. IMPRESSION: No evidence of right lower extremity DVT. Electronically Signed   By: Jolaine Click M.D.   On: 01/14/2019 13:57   Scheduled Meds: . atorvastatin  80 mg  Oral q1800  . diltiazem  120 mg Oral Daily  . spironolactone  12.5 mg Oral Daily   Continuous Infusions: . 0.9 % NaCl with KCl 40 mEq / L 50 mL/hr (01/15/19 0426)  . vancomycin 250 mL/hr at 01/14/19 1500   Active Problems:   Obstructive sleep apnea   Chronic atrial fibrillation   HTN (hypertension)   Hyperlipemia   Status post gastric bypass for obesity   Vitamin D deficiency   History of Severe aortic stenosis   Varicose veins of right lower extremity with complications   Pulmonary hypertension, unspecified (HCC)   History of subdural hematoma   S/P TAVR (transcatheter aortic valve replacement)   Sepsis (HCC)   Acute renal failure (HCC)   Right shoulder pain  Time spent:   Standley Dakins, MD [facility] 01/15/2019, 10:41 AM    LOS: 2 days  How to contact the [facility] Attending or Consulting provider 7A - 7P or covering provider during after hours 7P -7A, for this patient?  1. Check the care team in [facility] and look for a) attending/consulting [facility] provider listed and b) the [facility] team listed 2. Log into [email or web address] and use 's universal password to access. If you do not have the password, please contact the hospital operator. 3. Locate the [facility] provider you are looking for under [facility] and page to a number that you can be directly reached. 4. If you still have difficulty reaching the provider,  please page the [facility] (Director on Call) for the Hospitalists listed on amion for assistance.

## 2019-01-16 ENCOUNTER — Inpatient Hospital Stay (HOSPITAL_COMMUNITY): Payer: Medicare Other

## 2019-01-16 LAB — COMPREHENSIVE METABOLIC PANEL
ALT: 28 U/L (ref 0–44)
AST: 38 U/L (ref 15–41)
Albumin: 2.4 g/dL — ABNORMAL LOW (ref 3.5–5.0)
Alkaline Phosphatase: 96 U/L (ref 38–126)
Anion gap: 8 (ref 5–15)
BUN: 53 mg/dL — ABNORMAL HIGH (ref 8–23)
CO2: 19 mmol/L — AB (ref 22–32)
Calcium: 8.2 mg/dL — ABNORMAL LOW (ref 8.9–10.3)
Chloride: 112 mmol/L — ABNORMAL HIGH (ref 98–111)
Creatinine, Ser: 1.5 mg/dL — ABNORMAL HIGH (ref 0.61–1.24)
GFR calc Af Amer: 55 mL/min — ABNORMAL LOW (ref >60)
GFR calc non Af Amer: 47 mL/min — ABNORMAL LOW (ref >60)
GLUCOSE: 92 mg/dL (ref 70–99)
Potassium: 4.7 mmol/L (ref 3.5–5.1)
Sodium: 139 mmol/L (ref 135–145)
Total Bilirubin: 2.3 mg/dL — ABNORMAL HIGH (ref 0.3–1.2)
Total Protein: 6.2 g/dL — ABNORMAL LOW (ref 6.5–8.1)

## 2019-01-16 LAB — CBC WITH DIFFERENTIAL/PLATELET
ABS IMMATURE GRANULOCYTES: 0.38 10*3/uL — AB (ref 0.00–0.07)
Basophils Absolute: 0 10*3/uL (ref 0.0–0.1)
Basophils Relative: 0 %
Eosinophils Absolute: 0 10*3/uL (ref 0.0–0.5)
Eosinophils Relative: 0 %
HCT: 33.8 % — ABNORMAL LOW (ref 39.0–52.0)
Hemoglobin: 11.5 g/dL — ABNORMAL LOW (ref 13.0–17.0)
IMMATURE GRANULOCYTES: 1 %
LYMPHS PCT: 6 %
Lymphs Abs: 1.5 10*3/uL (ref 0.7–4.0)
MCH: 23.3 pg — ABNORMAL LOW (ref 26.0–34.0)
MCHC: 34 g/dL (ref 30.0–36.0)
MCV: 68.6 fL — ABNORMAL LOW (ref 80.0–100.0)
Monocytes Absolute: 1.9 10*3/uL — ABNORMAL HIGH (ref 0.1–1.0)
Monocytes Relative: 7 %
NEUTROS PCT: 86 %
Neutro Abs: 22.4 10*3/uL — ABNORMAL HIGH (ref 1.7–7.7)
Platelets: 78 10*3/uL — ABNORMAL LOW (ref 150–400)
RBC: 4.93 MIL/uL (ref 4.22–5.81)
RDW: 15.3 % (ref 11.5–15.5)
WBC: 26.2 10*3/uL — ABNORMAL HIGH (ref 4.0–10.5)
nRBC: 0.1 % (ref 0.0–0.2)

## 2019-01-16 LAB — MAGNESIUM: Magnesium: 3 mg/dL — ABNORMAL HIGH (ref 1.7–2.4)

## 2019-01-16 LAB — CULTURE, BLOOD (ROUTINE X 2): Special Requests: ADEQUATE

## 2019-01-16 MED ORDER — CEFAZOLIN SODIUM-DEXTROSE 2-4 GM/100ML-% IV SOLN
2.0000 g | Freq: Three times a day (TID) | INTRAVENOUS | Status: DC
  Administered 2019-01-16 – 2019-01-21 (×14): 2 g via INTRAVENOUS
  Filled 2019-01-16 (×16): qty 100

## 2019-01-16 MED ORDER — SODIUM CHLORIDE 0.9 % IV SOLN
2.0000 g | INTRAVENOUS | Status: DC
  Filled 2019-01-16: qty 20

## 2019-01-16 NOTE — Progress Notes (Signed)
PROGRESS NOTE  Joel Rogers  CZY:[id number]  DOB: [birth year]  DOA: 01/13/2019 PCP: [name], MD  Brief Admission Hx: 68 y/o male presented to the emergency department with right shoulder pain, fever tachycardia and cellulitis of the right lower extremity.  MDM/Assessment & Plan:   1. Severe sepsis- Resolved now.  The patient was treated with sepsis bundle and his lactic acid has normalized.  De-escalating antibiotics.  His blood cultures positive for group C strep.  Change antibiotics to Ancef.  2. Streptococcus bacteremia- unfortunately he does have a bioprosthetic aortic valve and is now at risk for vegetations .  Repeat blood cultures on 2/24 have not shown any growth.  2D echocardiogram does not show any evidence of vegetations.  Discussed with infectious disease who recommended TEE..  Change antibiotics to Ancef.  Will require a minimum of 2 weeks of therapy. 3. Acute renal failure- creatinine slowly improving with gentle IV fluid hydration.  Check a bladder scan as urine output has been poor.  Catheterize if unable to void. 4. Cellulitis of RLE - suspect streptococcus infection, clinically looking better, [place] RLE negative for DVT.  5. Chronic atrial fibrillation- temporarily holding Eliquis secondary to thrombocytopenia, continue Cardizem for rate control. 6. Thrombocytopenia- likely secondary to severe sepsis, platelet count appears to be stabilizing at 78, no spontaneous bleeding, continue to follow.  Holding Eliquis temporarily. 7. Leukocytosis - marked bump in WBC - clinically patient is improving. He is afebrile. Continue to monitor. 8. Status post TAVR for severe aortic stenosis-he is followed by Dr. Allyson Sabal.  He had been doing fairly well since surgery.   9. Acute on chronic right shoulder pain- plain film x-rays only showed arthritic changes in the right shoulder.  Overall he is feeling that right shoulder pain is better.   10. Essential hypertension- blood pressures  currently stable and controlled.  Follow.  DVT prophylaxis: SCDs Code Status: Full Family Communication: Patient updated at bedside Disposition Plan: Inpatient management for IV antibiotics  Consultants:    Procedures: Echo: 1. The left ventricle has normal systolic function with an ejection fraction of 60-65%. The cavity size was normal. There is mildly increased left ventricular wall thickness. Left ventricular diastolic Doppler parameters are indeterminate. No evidence  of left ventricular regional wall motion abnormalities.  2. The right ventricle has moderately reduced systolic function. The cavity was mildly enlarged. There is no increase in right ventricular wall thickness. Right ventricular systolic pressure is moderately elevated with an estimated pressure of 41.7  mmHg.  3. Left atrial size was severely dilated.  4. Right atrial size was severely dilated.  5. The mitral valve is normal in structure. There is mild mitral annular calcification present.  6. The tricuspid valve is normal in structure.  7. A 26 Edwards Sapien bioprosthetic aortic valve (TAVR) valve is present in the aortic position. Normal function observed with mean gradient 15.7 mmHg. No perivalvular leak.  8. The pulmonic valve was grossly normal. Pulmonic valve regurgitation is mild by color flow Doppler.  9. The aortic root is normal in size and structure. 10. The inferior vena cava was dilated in size with <50% respiratory variability.  11. No evidence of left ventricular regional wall motion abnormalities.  Antimicrobials:  Cefepime 2/22-2/24  Vancomycin 2/22 >> 2/25  Metronidazole 2/22 >2/13  Rocephin 2/25 >  Subjective: He does have some pain in his right hip when he tries to ambulate.  Overall he is feeling better since admission.  Objective: Vitals:   01/15/19  2139 01/16/19 0549 01/16/19 0926 01/16/19 1331  BP: 133/78 116/67 125/72 113/73  Pulse: 72 75 75 67  Resp: Temp: 98.6  F (37 C) 98.5 F (36.9 C)  99.2 F (37.3 C)  TempSrc: Oral Oral  Oral  SpO2: 99% 97% 98% 99%  Weight:      Height:        Intake/Output Summary (Last 24 hours) at 01/16/2019 1639 Last data filed at 01/16/2019 1439 Gross per 24 hour  Intake 720 ml  Output 550 ml  Net 170 ml   Filed Weights   01/13/19 1356  Weight: 110.7 kg   REVIEW OF SYSTEMS  As per history otherwise all reviewed and reported negative  Exam:  General exam: Alert, awake, oriented x 3 Respiratory system: Clear to auscultation. Respiratory effort normal. Cardiovascular system:RRR. No murmurs, rubs, gallops. Gastrointestinal system: Abdomen is nondistended, soft and nontender. No organomegaly or masses felt. Normal bowel sounds heard. Central nervous system: Alert and oriented. No focal neurological deficits. Extremities: No C/C/E, +pedal pulses Skin: Darkening of skin on right lower extremity.  Right lower extremity is more edematous than left.  Mild tenderness to palpation over right hip.  Bruising noted in the thigh above the knee. Psychiatry: Judgement and insight appear normal. Mood & affect appropriate.   Basic Metabolic Panel: Recent Labs  Lab 01/13/19 1550 01/13/19 1947 01/14/19 0618 01/15/19 0415 01/16/19 0427  NA 135  --  136 139 139  K 3.1*  --  3.8 4.2 4.7  CL 104  --  107 112* 112*  CO2 19*  --  18* 19* 19*  GLUCOSE 134*  --  117* 99 92  BUN 36*  --  40* 46* 53*  CREATININE 1.55*  --  1.57* 1.40* 1.50*  CALCIUM 8.2*  --  8.1* 8.1* 8.2*  MG  --  1.9  --  2.8* 3.0*   Liver Function Tests: Recent Labs  Lab 01/13/19 1550 01/15/19 0415 01/16/19 0427  AST 53* 43* 38  ALT ALKPHOS 115 90 96  BILITOT 3.2* 2.0* 2.3*  PROT 7.0 5.6* 6.2*  ALBUMIN 3.1* 2.3* 2.4*   No results for input(s): LIPASE, AMYLASE in the last 168 hours. No results for input(s): AMMONIA in the last 168 hours. CBC: Recent Labs  Lab 01/13/19 1550 01/14/19 0618 01/15/19 0415 01/16/19 0427  WBC 21.1*  24.8* 31.0* 26.2*  NEUTROABS 19.5*  --  27.1* 22.4*  HGB 12.5* 12.2* 11.6* 11.5*  HCT 37.4* 36.4* 34.0* 33.8*  MCV 71.0* 70.3* 70.0* 68.6*  PLT 88* 66* 62* 78*   Cardiac Enzymes: No results for input(s): CKTOTAL, CKMB, CKMBINDEX, TROPONINI in the last 168 hours. CBG (last 3)  No results for input(s): GLUCAP in the last 72 hours. Recent Results (from the past 240 hour(s))  Culture, blood (routine x 2)     Status: None (Preliminary result)   Collection Time: 01/13/19  3:51 PM  Result Value Ref Range Status   Specimen Description BLOOD LEFT ARM  Final   Special Requests   Final    BOTTLES DRAWN AEROBIC ONLY Blood Culture adequate volume   Culture   Final    NO GROWTH 3 DAYS Performed at [facility], [address], [place], [name] [address]    Report Status PENDING  Incomplete  Urine culture     Status: Abnormal   Collection Time: 01/13/19  5:40 PM  Result Value Ref Range Status   Specimen Description  Final    URINE, CATHETERIZED Performed at [facility], [address], [place], [name] [address]    Special Requests   Final    NONE Performed at [facility], [address], [place], [name] [address]    Culture (A)  Final    <10,000 COLONIES/mL INSIGNIFICANT GROWTH Performed at [facility], [address] [place], [name] [address]    Report Status 01/15/2019 FINAL  Final  Culture, blood (routine x 2)     Status: Abnormal   Collection Time: 01/13/19  7:47 PM  Result Value Ref Range Status   Specimen Description   Final    BLOOD LEFT ARM Performed at [facility], [address], [place], [name] [address]    Special Requests   Final    BOTTLES DRAWN AEROBIC AND ANAEROBIC Blood Culture adequate volume Performed at [facility], [address] [place], [name] [address]    Culture  Setup Time   Final    GRAM POSITIVE COCCI IN CHAINS Gram Stain Report Called to,Read Back By and Verified With: BOUDERANT,R @ 0516 ON 01/14/19 BY JUW GS DONE @ [facility] CRITICAL RESULT  CALLED TO, READ BACK BY AND VERIFIED WITH: S. HALL, PHARMD ([facility]) AT 1010 ON 01/14/19 BY C. JESSUP, MLT. Performed at [facility], [address], [place], [name] [address]    Culture STREPTOCOCCUS GROUP C (A)  Final   Report Status 01/16/2019 FINAL  Final   Organism ID, Bacteria STREPTOCOCCUS GROUP C  Final      Susceptibility   Streptococcus group c - MIC*    CLINDAMYCIN <=0.25 SENSITIVE Sensitive     AMPICILLIN <=0.25 SENSITIVE Sensitive     ERYTHROMYCIN <=0.12 SENSITIVE Sensitive     VANCOMYCIN <=0.12 SENSITIVE Sensitive     CEFTRIAXONE <=0.12 SENSITIVE Sensitive     LEVOFLOXACIN 0.5 SENSITIVE Sensitive     PENICILLIN Value in next row Sensitive      SENSITIVE<=0.06    * STREPTOCOCCUS GROUP C  Blood Culture ID Panel (Reflexed)     Status: Abnormal   Collection Time: 01/13/19  7:47 PM  Result Value Ref Range Status   Enterococcus species NOT DETECTED NOT DETECTED Final   Listeria monocytogenes NOT DETECTED NOT DETECTED Final   Staphylococcus species NOT DETECTED NOT DETECTED Final   Staphylococcus aureus (BCID) NOT DETECTED NOT DETECTED Final   Streptococcus species DETECTED (A) NOT DETECTED Final    Comment: Not Enterococcus species, Streptococcus agalactiae, Streptococcus pyogenes, or Streptococcus pneumoniae. CRITICAL RESULT CALLED TO, READ BACK BY AND VERIFIED WITH: S. HALL, PHARMD ([facility]) AT 1010 ON 01/14/19 BY C. JESSUP, MLT.    Streptococcus agalactiae NOT DETECTED NOT DETECTED Final   Streptococcus pneumoniae NOT DETECTED NOT DETECTED Final   Streptococcus pyogenes NOT DETECTED NOT DETECTED Final   Acinetobacter baumannii NOT DETECTED NOT DETECTED Final   Enterobacteriaceae species NOT DETECTED NOT DETECTED Final   Enterobacter cloacae complex NOT DETECTED NOT DETECTED Final   Escherichia coli NOT DETECTED NOT DETECTED Final   Klebsiella oxytoca NOT DETECTED NOT DETECTED Final   Klebsiella pneumoniae NOT DETECTED NOT DETECTED Final   Proteus species NOT DETECTED  NOT DETECTED Final   Serratia marcescens NOT DETECTED NOT DETECTED Final   Haemophilus influenzae NOT DETECTED NOT DETECTED Final   Neisseria meningitidis NOT DETECTED NOT DETECTED Final   Pseudomonas aeruginosa NOT DETECTED NOT DETECTED Final   Candida albicans NOT DETECTED NOT DETECTED Final   Candida glabrata NOT DETECTED NOT DETECTED Final  Candida krusei NOT DETECTED NOT DETECTED Final   Candida parapsilosis NOT DETECTED NOT DETECTED Final   Candida tropicalis NOT DETECTED NOT DETECTED Final    Comment: Performed at [facility], [address], [place], [name] [address]  Culture, blood (Routine X 2) w Reflex to ID Panel     Status: None (Preliminary result)   Collection Time: 01/15/19  8:52 AM  Result Value Ref Range Status   Specimen Description BLOOD LEFT FOREARM  Final   Special Requests   Final    BOTTLES DRAWN AEROBIC AND ANAEROBIC Blood Culture adequate volume   Culture   Final    NO GROWTH < 24 HOURS Performed at [facility] [address] [place], [name] [address]    Report Status PENDING  Incomplete  Culture, blood (Routine X 2) w Reflex to ID Panel     Status: None (Preliminary result)   Collection Time: 01/15/19  9:05 AM  Result Value Ref Range Status   Specimen Description BLOOD LEFT HAND  Final   Special Requests   Final    BOTTLES DRAWN AEROBIC AND ANAEROBIC Blood Culture results may not be optimal due to an excessive volume of blood received in culture bottles   Culture   Final    NO GROWTH < 24 HOURS Performed at [facility], [address], [place], [name] [address]    Report Status PENDING  Incomplete     Studies: Dg Chest Port 1 View  Result Date: 01/15/2019 CLINICAL DATA:  Bacteremia. EXAM: PORTABLE CHEST 1 VIEW COMPARISON:  01/13/2019 FINDINGS: Lungs are adequately inflated without focal airspace consolidation or effusion. There is moderate stable cardiomegaly. Remainder of the exam is unchanged. IMPRESSION: No acute cardiopulmonary  disease. Moderate stable cardiomegaly. Electronically Signed   By: Elberta Fortis M.D.   On: 01/15/2019 13:38   Scheduled Meds: . atorvastatin  80 mg Oral q1800  . diltiazem  120 mg Oral Daily  . spironolactone  12.5 mg Oral Daily   Continuous Infusions: . 0.9 % NaCl with KCl 40 mEq / L 50 mL/hr (01/16/19 1313)  . cefTRIAXone (ROCEPHIN)  IV     Active Problems:   Obstructive sleep apnea   Chronic atrial fibrillation   HTN (hypertension)   Hyperlipemia   Status post gastric bypass for obesity   Vitamin D deficiency   History of Severe aortic stenosis   Varicose veins of right lower extremity with complications   Pulmonary hypertension, unspecified (HCC)   History of subdural hematoma   S/P TAVR (transcatheter aortic valve replacement)   Sepsis (HCC)   Acute renal failure (HCC)   Right shoulder pain  Time spent:  Erick Blinks, MD [facility] 01/16/2019, 4:39 PM    LOS: 3 days

## 2019-01-16 NOTE — Progress Notes (Signed)
Pharmacy Antibiotic Note  Joel Rogers is a 68 y.o. male admitted on 01/13/2019 with bacteremia.  Pharmacy has been consulted for Cefazolin dosing.  Plan: Cefazolin 2gm IV q8h F/U cxs and clinical progress  Monitor V/S, labs  Height: 6' (182.9 cm) Weight: 244 lb (110.7 kg) IBW/kg (Calculated) : 77.6  Temp (24hrs), Avg:98.8 F (37.1 C), Min:98.5 F (36.9 C), Max:99.2 F (37.3 C)  Recent Labs  Lab 01/13/19 1550 01/13/19 1947 01/13/19 2337 01/14/19 0618 01/14/19 0932 01/15/19 0415 01/16/19 0427  WBC 21.1*  --   --  24.8*  --  31.0* 26.2*  CREATININE 1.55*  --   --  1.57*  --  1.40* 1.50*  LATICACIDVEN 3.2* 2.4* 2.6* 1.7 1.5  --   --     Estimated Creatinine Clearance: 60.5 mL/min (A) (by C-G formula based on SCr of 1.5 mg/dL (H)).    No Known Allergies  Antimicrobials this admission: Cefazolin 2/25 >>  Vanco 2/23 >>2/25 Cefepime 2/23 >>2/24 Flagyl 2/23 >> 2/24  Dose adjustments this admission: N/A  Microbiology results: 2/24 BloodCx: ngtd 2/22 BloodCx: GrpC strep pan sensitive 2/24 Ucx: <10,000 CFU/ml, insignificant growth  Thank you for allowing pharmacy to be a part of this patient's care.  Tera Mater 01/16/2019 4:52 PM

## 2019-01-16 NOTE — Progress Notes (Addendum)
Physical Therapy Treatment Patient Details Name: Joel Rogers MRN: [id number] DOB: July 26, 1951 Today's Date: 01/16/2019    History of Present Illness Joel Rogers is a 68 y.o. male with medical history significant for diastolic CHF, aortic stenosis status post TAVR, atrial fibrillation on anticoagulation, OSA, HTN, pulmonary hypertension, who presented to the ED with complaints of right shoulder pain over the past 3 to 4 days, but patient has chronic problems with his right shoulder and at baseline he is unable to move it around much.  He also has been feeling like he had the flu for the past 5 days.    PT Comments    Patient states nursing staff had to use lift to put him back to bed after sitting yesterday secondary to fatigue after sitting up in chair.  Patient demonstrates slow labored movement with inability to use RUE due to weakness, shoulder pain and required head of bed raised.  Patient unable to grip RW with right hand due to weakness and unable to complete sit to stand after repeated attempts with bed raised due to mostly due to right side weakness and pain in right shoulder/RLE.  Patient has difficulty maintaining sitting balance with frequent leaning backwards while completing BLE ROM/strengthening exercises and required assistance to move BLE when put back to bed with c/o severe RLE pain.  Patient will benefit from continued physical therapy in hospital and recommended venue below to increase strength, balance, endurance for safe ADLs and gait.   Follow Up Recommendations  SNF     Equipment Recommendations  None recommended by PT    Recommendations for Other Services       Precautions / Restrictions Precautions Precautions: Fall Restrictions Weight Bearing Restrictions: No    Mobility  Bed Mobility Overal bed mobility: Needs Assistance Bed Mobility: Supine to Sit;Sit to Supine     Supine to sit: Mod assist;Max assist Sit to supine: Mod assist   General bed  mobility comments: slow labored movement, unable to use RUE  Transfers                    Ambulation/Gait                 Stairs             Wheelchair Mobility    Modified Rankin (Stroke Patients Only)       Balance Overall balance assessment: Needs assistance Sitting-balance support: Feet supported;No upper extremity supported Sitting balance-Leahy Scale: Fair Sitting balance - Comments: fair static sitting balance, fair/poor dynamic while performing exercises, tend to lean backwards Postural control: Posterior lean                                  Cognition Arousal/Alertness: Awake/alert Behavior During Therapy: WFL for tasks assessed/performed Overall Cognitive Status: Within Functional Limits for tasks assessed                                        Exercises General Exercises - Lower Extremity Long Arc Quad: Seated;AROM;Strengthening;Both;10 reps Hip Flexion/Marching: Seated;Strengthening;AAROM;Both;10 reps Toe Raises: Seated;Strengthening;AROM;Both;10 reps Heel Raises: Seated;AROM;Strengthening;Both;10 reps    General Comments        Pertinent Vitals/Pain Pain Assessment: Faces Faces Pain Scale: Hurts little more Pain Location: right shoulder Pain Descriptors / Indicators: Sore;Guarding;Grimacing Pain Intervention(s): Limited activity within  patient's tolerance;Monitored during session    Home Living                      Prior Function            PT Goals (current goals can now be found in the care plan section) Acute Rehab PT Goals Patient Stated Goal: return home after rehab PT Goal Formulation: With patient/family Time For Goal Achievement: 01/29/19 Potential to Achieve Goals: Good Progress towards PT goals: Progressing toward goals    Frequency    Min 3X/week      PT Plan Current plan remains appropriate    Co-evaluation              AM-PAC PT "6 Clicks" Mobility    Outcome Measure  Help needed turning from your back to your side while in a flat bed without using bedrails?: A Lot Help needed moving from lying on your back to sitting on the side of a flat bed without using bedrails?: A Lot Help needed moving to and from a bed to a chair (including a wheelchair)?: Total Help needed standing up from a chair using your arms (e.g., wheelchair or bedside chair)?: Total Help needed to walk in hospital room?: Total Help needed climbing 3-5 steps with a railing? : Total 6 Click Score: 8    End of Session Equipment Utilized During Treatment: Gait belt Activity Tolerance: Patient tolerated treatment well;Patient limited by fatigue Patient left: in bed;with call bell/phone within reach;with family/visitor present Nurse Communication: Mobility status PT Visit Diagnosis: Unsteadiness on feet (R26.81);Other abnormalities of gait and mobility (R26.89);Muscle weakness (generalized) (M62.81)     Time: 8299-3716 PT Time Calculation (min) (ACUTE ONLY): 31 min  Charges:  $Therapeutic Exercise: 8-22 mins $Therapeutic Activity: 8-22 mins                     2:03 PM, 01/16/19 [name], MPT Physical Therapist with [facility] [phone] office [phone] mobile phone

## 2019-01-16 NOTE — Progress Notes (Signed)
1130 Writer went in to hang IV medications and patient's IV was out. Patient stated that it came out during physical therapy. 1140 Writer attempted to start another IV but was unsuccessful. Charge nurse informed of the situation and he went in to restart IV.

## 2019-01-17 ENCOUNTER — Inpatient Hospital Stay (HOSPITAL_COMMUNITY): Payer: Medicare Other

## 2019-01-17 DIAGNOSIS — M25511 Pain in right shoulder: Secondary | ICD-10-CM

## 2019-01-17 DIAGNOSIS — R7881 Bacteremia: Secondary | ICD-10-CM

## 2019-01-17 DIAGNOSIS — Z952 Presence of prosthetic heart valve: Secondary | ICD-10-CM

## 2019-01-17 DIAGNOSIS — R945 Abnormal results of liver function studies: Secondary | ICD-10-CM

## 2019-01-17 LAB — CBC WITH DIFFERENTIAL/PLATELET
Abs Immature Granulocytes: 0.71 10*3/uL — ABNORMAL HIGH (ref 0.00–0.07)
Basophils Absolute: 0 10*3/uL (ref 0.0–0.1)
Basophils Relative: 0 %
EOS PCT: 0 %
Eosinophils Absolute: 0 10*3/uL (ref 0.0–0.5)
HCT: 31.7 % — ABNORMAL LOW (ref 39.0–52.0)
Hemoglobin: 11 g/dL — ABNORMAL LOW (ref 13.0–17.0)
Immature Granulocytes: 3 %
Lymphocytes Relative: 7 %
Lymphs Abs: 1.7 10*3/uL (ref 0.7–4.0)
MCH: 24.2 pg — ABNORMAL LOW (ref 26.0–34.0)
MCHC: 34.7 g/dL (ref 30.0–36.0)
MCV: 69.7 fL — ABNORMAL LOW (ref 80.0–100.0)
Monocytes Absolute: 2.3 10*3/uL — ABNORMAL HIGH (ref 0.1–1.0)
Monocytes Relative: 10 %
Neutro Abs: 19.4 10*3/uL — ABNORMAL HIGH (ref 1.7–7.7)
Neutrophils Relative %: 80 %
Platelets: 105 10*3/uL — ABNORMAL LOW (ref 150–400)
RBC: 4.55 MIL/uL (ref 4.22–5.81)
RDW: 15 % (ref 11.5–15.5)
WBC: 24.2 10*3/uL — ABNORMAL HIGH (ref 4.0–10.5)
nRBC: 0.1 % (ref 0.0–0.2)

## 2019-01-17 LAB — COMPREHENSIVE METABOLIC PANEL
ALT: 24 U/L (ref 0–44)
ANION GAP: 7 (ref 5–15)
AST: 30 U/L (ref 15–41)
Albumin: 2.3 g/dL — ABNORMAL LOW (ref 3.5–5.0)
Alkaline Phosphatase: 105 U/L (ref 38–126)
BUN: 51 mg/dL — ABNORMAL HIGH (ref 8–23)
CO2: 19 mmol/L — ABNORMAL LOW (ref 22–32)
Calcium: 8.2 mg/dL — ABNORMAL LOW (ref 8.9–10.3)
Chloride: 112 mmol/L — ABNORMAL HIGH (ref 98–111)
Creatinine, Ser: 1.45 mg/dL — ABNORMAL HIGH (ref 0.61–1.24)
GFR calc Af Amer: 57 mL/min — ABNORMAL LOW (ref >60)
GFR calc non Af Amer: 49 mL/min — ABNORMAL LOW (ref >60)
Glucose, Bld: 100 mg/dL — ABNORMAL HIGH (ref 70–99)
Potassium: 4.9 mmol/L (ref 3.5–5.1)
Sodium: 138 mmol/L (ref 135–145)
Total Bilirubin: 1.6 mg/dL — ABNORMAL HIGH (ref 0.3–1.2)
Total Protein: 6 g/dL — ABNORMAL LOW (ref 6.5–8.1)

## 2019-01-17 LAB — INFLUENZA PANEL BY PCR (TYPE A & B)
Influenza A By PCR: NEGATIVE
Influenza B By PCR: NEGATIVE

## 2019-01-17 MED ORDER — FUROSEMIDE 10 MG/ML IJ SOLN
20.0000 mg | Freq: Once | INTRAMUSCULAR | Status: AC
  Administered 2019-01-17: 20 mg via INTRAVENOUS
  Filled 2019-01-17: qty 2

## 2019-01-17 NOTE — Progress Notes (Signed)
Physical Therapy Treatment Patient Details Name: Joel Rogers MRN: [id number] DOB: [birth year] Today's Date: 01/17/2019    History of Present Illness Joel Rogers is a 68 y.o. male with medical history significant for diastolic CHF, aortic stenosis status post TAVR, atrial fibrillation on anticoagulation, OSA, HTN, pulmonary hypertension, who presented to the ED with complaints of right shoulder pain over the past 3 to 4 days, but patient has chronic problems with his right shoulder and at baseline he is unable to move it around much.  He also has been feeling like he had the flu for the past 5 days.    PT Comments    Patient limited to sitting up at bedside due to c/o lethargy after taking medications.  Patient frequently falls backwards, only able to keep trunk in midline for up to 1-2 minutes, loses sitting balance when completing BLE ROM/strengthening exercises, required active assistance to complete exercises due to weakness and c/o fatigue and Max assist to reposition with bed in head down position when put back to bed.  Patient will benefit from continued physical therapy in hospital and recommended venue below to increase strength, balance, endurance for safe ADLs and gait.    Follow Up Recommendations  SNF     Equipment Recommendations  None recommended by PT    Recommendations for Other Services       Precautions / Restrictions Precautions Precautions: Fall Restrictions Weight Bearing Restrictions: No    Mobility  Bed Mobility Overal bed mobility: Needs Assistance Bed Mobility: Supine to Sit;Sit to Supine     Supine to sit: Max assist Sit to supine: Mod assist   General bed mobility comments: head of bed raised, limited use of RUE  Transfers                    Ambulation/Gait                 Stairs             Wheelchair Mobility    Modified Rankin (Stroke Patients Only)       Balance Overall balance assessment: Needs  assistance Sitting-balance support: Feet supported;Bilateral upper extremity supported Sitting balance-Leahy Scale: Poor Sitting balance - Comments: frequent leaning backwards due to fatigue Postural control: Posterior lean                                  Cognition Arousal/Alertness: Awake/alert Behavior During Therapy: WFL for tasks assessed/performed Overall Cognitive Status: Within Functional Limits for tasks assessed                                        Exercises General Exercises - Lower Extremity Long Arc Quad: Seated;Strengthening;Both;10 reps;AAROM Hip Flexion/Marching: Seated;Strengthening;AAROM;Both;10 reps Toe Raises: Seated;Strengthening;AROM;Both;10 reps Heel Raises: Seated;AROM;Strengthening;Both;10 reps    General Comments        Pertinent Vitals/Pain Pain Assessment: Faces Faces Pain Scale: Hurts a little bit Pain Location: right shoulder Pain Descriptors / Indicators: Sore;Grimacing Pain Intervention(s): Limited activity within patient's tolerance;Monitored during session;Premedicated before session    Home Living                      Prior Function            PT Goals (current goals can now be found in the  care plan section) Acute Rehab PT Goals Patient Stated Goal: return home after rehab PT Goal Formulation: With patient/family Time For Goal Achievement: 01/29/19 Potential to Achieve Goals: Good Progress towards PT goals: Progressing toward goals    Frequency    Min 3X/week      PT Plan Current plan remains appropriate    Co-evaluation              AM-PAC PT "6 Clicks" Mobility   Outcome Measure  Help needed turning from your back to your side while in a flat bed without using bedrails?: A Lot Help needed moving from lying on your back to sitting on the side of a flat bed without using bedrails?: A Lot Help needed moving to and from a bed to a chair (including a wheelchair)?: Total Help  needed standing up from a chair using your arms (e.g., wheelchair or bedside chair)?: Total Help needed to walk in hospital room?: Total Help needed climbing 3-5 steps with a railing? : Total 6 Click Score: 8    End of Session   Activity Tolerance: Patient limited by fatigue;Patient tolerated treatment well Patient left: in bed;with call bell/phone within reach;with family/visitor present Nurse Communication: Mobility status PT Visit Diagnosis: Unsteadiness on feet (R26.81);Other abnormalities of gait and mobility (R26.89);Muscle weakness (generalized) (M62.81)     Time: 6213-0865 PT Time Calculation (min) (ACUTE ONLY): 24 min  Charges:  $Therapeutic Exercise: 8-22 mins $Therapeutic Activity: 8-22 mins                     2:11 PM, 01/17/19 Joel Rogers, MPT Physical Therapist with [facility] [phone] office [phone] mobile phone

## 2019-01-17 NOTE — Progress Notes (Addendum)
PROGRESS NOTE  Joel Rogers  LPF:[id number]  DOB: 1951-10-22  DOA: 01/13/2019 PCP: [name], MD  Brief Admission Hx: 68 y/o male presented to the emergency department with right shoulder pain, fever tachycardia and cellulitis of the right lower extremity.  MDM/Assessment & Plan:   1. Severe sepsis- Resolved now.  The patient was treated with sepsis bundle and his lactic acid has normalized.  De-escalating antibiotics.  His blood cultures positive for group C strep.  Change antibiotics to Ancef.  2. Streptococcus bacteremia- unfortunately he does have a bioprosthetic aortic valve and is now at risk for vegetations .  Repeat blood cultures on 2/24 have not shown any growth.  2D echocardiogram does not show any evidence of vegetations.  Discussed with infectious disease who recommended TEE.  Change antibiotics to Ancef.  Will require a minimum of 2 weeks of therapy.  Discussed with cardiology who will perform TEE in a.m.  3. Acute renal failure- creatinine slowly improving with gentle IV fluid hydration.  Check a bladder scan as urine output has been poor.  Catheterize if unable to void. 4. Cellulitis of RLE - suspect streptococcus infection, clinically looking better, [place] RLE negative for DVT.  5. Chronic atrial fibrillation- temporarily holding Eliquis secondary to thrombocytopenia, continue Cardizem for rate control. 6. Thrombocytopenia- likely secondary to severe sepsis, platelet count slowly improving, no spontaneous bleeding, continue to follow.  Holding Eliquis temporarily. 7. Leukocytosis - marked bump in WBC - clinically patient is improving.  He is having some low-grade fever.  Further work-up in process. Continue to monitor. 8. Status post TAVR for severe aortic stenosis-he is followed by Dr. Allyson Sabal.  He had been doing fairly well since surgery.   9. Acute on chronic right shoulder pain- plain film x-rays only showed arthritic changes in the right shoulder.   10. Essential  hypertension- blood pressures currently stable and controlled.  Follow. 11. Hyperbilirubinemia.  Trending down with IV fluids since admission.  Check right upper quadrant ultrasound.  DVT prophylaxis: SCDs Code Status: Full Family Communication: Patient updated at bedside Disposition Plan: Inpatient management for IV antibiotics  Consultants:  Cardiology for TEE  Procedures: Echo: 1. The left ventricle has normal systolic function with an ejection fraction of 60-65%. The cavity size was normal. There is mildly increased left ventricular wall thickness. Left ventricular diastolic Doppler parameters are indeterminate. No evidence  of left ventricular regional wall motion abnormalities.  2. The right ventricle has moderately reduced systolic function. The cavity was mildly enlarged. There is no increase in right ventricular wall thickness. Right ventricular systolic pressure is moderately elevated with an estimated pressure of 41.7  mmHg.  3. Left atrial size was severely dilated.  4. Right atrial size was severely dilated.  5. The mitral valve is normal in structure. There is mild mitral annular calcification present.  6. The tricuspid valve is normal in structure.  7. A 26 Edwards Sapien bioprosthetic aortic valve (TAVR) valve is present in the aortic position. Normal function observed with mean gradient 15.7 mmHg. No perivalvular leak.  8. The pulmonic valve was grossly normal. Pulmonic valve regurgitation is mild by color flow Doppler.  9. The aortic root is normal in size and structure. 10. The inferior vena cava was dilated in size with <50% respiratory variability.  11. No evidence of left ventricular regional wall motion abnormalities.  Antimicrobials:  Cefepime 2/22-2/24  Vancomycin 2/22 >> 2/25  Metronidazole 2/22 >2/13  Ancef 2/25 >  Subjective: Continues to have pain in  his right shoulder and has difficulty raising his right arm.  Overall feels that pain in his right  hip is better.  Objective: Vitals:   01/16/19 2147 01/17/19 0553 01/17/19 1427 01/17/19 1636  BP: 119/82 129/88 139/89   Pulse: 74 72 67 89  Resp: (!) 24 20 19    Temp: 98.2 F (36.8 C)  97.8 F (36.6 C) 100.3 F (37.9 C)  TempSrc: Oral  Oral Oral  SpO2: 98% 99% 100% 100%  Weight:      Height:        Intake/Output Summary (Last 24 hours) at 01/17/2019 1829 Last data filed at 01/17/2019 1501 Gross per 24 hour  Intake 1874.57 ml  Output 400 ml  Net 1474.57 ml   Filed Weights   01/13/19 1356  Weight: 110.7 kg   REVIEW OF SYSTEMS  As per history otherwise all reviewed and reported negative  Exam:  General exam: Alert, awake, oriented x 3 Respiratory system: Crackles at bases. Respiratory effort normal. Cardiovascular system:RRR. No murmurs, rubs, gallops. Gastrointestinal system: Abdomen is nondistended, soft and nontender. No organomegaly or masses felt. Normal bowel sounds heard. Central nervous system: Alert and oriented. No focal neurological deficits. Extremities: Tenderness with active motion of right shoulder.  1+ bilateral lower extremity edema Skin: Venous stasis changes on right lower extremity with some erythema Psychiatry: Judgement and insight appear normal. Mood & affect appropriate.     Basic Metabolic Panel: Recent Labs  Lab 01/13/19 1550 01/13/19 1947 01/14/19 0618 01/15/19 0415 01/16/19 0427 01/17/19 0418  NA 135  --  136 139 139 138  K 3.1*  --  3.8 4.2 4.7 4.9  CL 104  --  107 112* 112* 112*  CO2 19*  --  18* 19* 19* 19*  GLUCOSE 134*  --  117* 99 92 100*  BUN 36*  --  40* 46* 53* 51*  CREATININE 1.55*  --  1.57* 1.40* 1.50* 1.45*  CALCIUM 8.2*  --  8.1* 8.1* 8.2* 8.2*  MG  --  1.9  --  2.8* 3.0*  --    Liver Function Tests: Recent Labs  Lab 01/13/19 1550 01/15/19 0415 01/16/19 0427 01/17/19 0418  AST 53* 43* 38 30  ALT 30 29 28 24   ALKPHOS 115 90 96 105  BILITOT 3.2* 2.0* 2.3* 1.6*  PROT 7.0 5.6* 6.2* 6.0*  ALBUMIN 3.1* 2.3*  2.4* 2.3*   No results for input(s): LIPASE, AMYLASE in the last 168 hours. No results for input(s): AMMONIA in the last 168 hours. CBC: Recent Labs  Lab 01/13/19 1550 01/14/19 0618 01/15/19 0415 01/16/19 0427 01/17/19 0418  WBC 21.1* 24.8* 31.0* 26.2* 24.2*  NEUTROABS 19.5*  --  27.1* 22.4* 19.4*  HGB 12.5* 12.2* 11.6* 11.5* 11.0*  HCT 37.4* 36.4* 34.0* 33.8* 31.7*  MCV 71.0* 70.3* 70.0* 68.6* 69.7*  PLT 88* 66* 62* 78* 105*   Cardiac Enzymes: No results for input(s): CKTOTAL, CKMB, CKMBINDEX, TROPONINI in the last 168 hours. CBG (last 3)  No results for input(s): GLUCAP in the last 72 hours. Recent Results (from the past 240 hour(s))  Culture, blood (routine x 2)     Status: None (Preliminary result)   Collection Time: 01/13/19  3:51 PM  Result Value Ref Range Status   Specimen Description BLOOD LEFT ARM  Final   Special Requests   Final    BOTTLES DRAWN AEROBIC ONLY Blood Culture adequate volume   Culture   Final    NO GROWTH 4 DAYS Performed at  [facility], [address] [place], [name] [address]    Report Status PENDING  Incomplete  Urine culture     Status: Abnormal   Collection Time: 01/13/19  5:40 PM  Result Value Ref Range Status   Specimen Description   Final    URINE, CATHETERIZED Performed at [facility] , [address], [place], [name] [address]    Special Requests   Final    NONE Performed at [facility], [address], [place], [name] [address]    Culture (A)  Final    <10,000 COLONIES/mL INSIGNIFICANT GROWTH Performed at [facility], [address] [place], [name] [address]    Report Status 01/15/2019 FINAL  Final  Culture, blood (routine x 2)     Status: Abnormal   Collection Time: 01/13/19  7:47 PM  Result Value Ref Range Status   Specimen Description   Final    BLOOD LEFT ARM Performed at [facility], [address], [place], [name] [address]    Special Requests   Final    BOTTLES DRAWN AEROBIC AND ANAEROBIC Blood Culture  adequate volume Performed at [facility], [address], [place], [name] [address]    Culture  Setup Time   Final    GRAM POSITIVE COCCI IN CHAINS Gram Stain Report Called to,Read Back By and Verified With: BOUDERANT,R @ 0516 ON 01/14/19 BY JUW GS DONE @ [facility] CRITICAL RESULT CALLED TO, READ BACK BY AND VERIFIED WITH: S. HALL, PHARMD ([facility]) AT 1010 ON 01/14/19 BY C. JESSUP, MLT. Performed at [facility], [address] [place], [name] [address]    Culture STREPTOCOCCUS GROUP C (A)  Final   Report Status 01/16/2019 FINAL  Final   Organism ID, Bacteria STREPTOCOCCUS GROUP C  Final      Susceptibility   Streptococcus group c - MIC*    CLINDAMYCIN <=0.25 SENSITIVE Sensitive     AMPICILLIN <=0.25 SENSITIVE Sensitive     ERYTHROMYCIN <=0.12 SENSITIVE Sensitive     VANCOMYCIN <=0.12 SENSITIVE Sensitive     CEFTRIAXONE <=0.12 SENSITIVE Sensitive     LEVOFLOXACIN 0.5 SENSITIVE Sensitive     PENICILLIN Value in next row Sensitive      SENSITIVE<=0.06    * STREPTOCOCCUS GROUP C  Blood Culture ID Panel (Reflexed)     Status: Abnormal   Collection Time: 01/13/19  7:47 PM  Result Value Ref Range Status   Enterococcus species NOT DETECTED NOT DETECTED Final   Listeria monocytogenes NOT DETECTED NOT DETECTED Final   Staphylococcus species NOT DETECTED NOT DETECTED Final   Staphylococcus aureus (BCID) NOT DETECTED NOT DETECTED Final   Streptococcus species DETECTED (A) NOT DETECTED Final    Comment: Not Enterococcus species, Streptococcus agalactiae, Streptococcus pyogenes, or Streptococcus pneumoniae. CRITICAL RESULT CALLED TO, READ BACK BY AND VERIFIED WITH: S. HALL, PHARMD ([facility]) AT 1010 ON 01/14/19 BY C. JESSUP, MLT.    Streptococcus agalactiae NOT DETECTED NOT DETECTED Final   Streptococcus pneumoniae NOT DETECTED NOT DETECTED Final   Streptococcus pyogenes NOT DETECTED NOT DETECTED Final   Acinetobacter baumannii NOT DETECTED NOT DETECTED Final   Enterobacteriaceae species NOT  DETECTED NOT DETECTED Final   Enterobacter cloacae complex NOT DETECTED NOT DETECTED Final   Escherichia coli NOT DETECTED NOT DETECTED Final   Klebsiella oxytoca NOT DETECTED NOT DETECTED Final   Klebsiella pneumoniae NOT DETECTED NOT DETECTED Final   Proteus species NOT DETECTED NOT DETECTED Final   Serratia marcescens NOT DETECTED NOT DETECTED Final  Haemophilus influenzae NOT DETECTED NOT DETECTED Final   Neisseria meningitidis NOT DETECTED NOT DETECTED Final   Pseudomonas aeruginosa NOT DETECTED NOT DETECTED Final   Candida albicans NOT DETECTED NOT DETECTED Final   Candida glabrata NOT DETECTED NOT DETECTED Final   Candida krusei NOT DETECTED NOT DETECTED Final   Candida parapsilosis NOT DETECTED NOT DETECTED Final   Candida tropicalis NOT DETECTED NOT DETECTED Final    Comment: Performed at [facility], [address] [place], [name] [address]  Culture, blood (Routine X 2) w Reflex to ID Panel     Status: None (Preliminary result)   Collection Time: 01/15/19  8:52 AM  Result Value Ref Range Status   Specimen Description BLOOD LEFT FOREARM  Final   Special Requests   Final    BOTTLES DRAWN AEROBIC AND ANAEROBIC Blood Culture adequate volume   Culture   Final    NO GROWTH 2 DAYS Performed at [facility], [address], [place], [name] [address]    Report Status PENDING  Incomplete  Culture, blood (Routine X 2) w Reflex to ID Panel     Status: None (Preliminary result)   Collection Time: 01/15/19  9:05 AM  Result Value Ref Range Status   Specimen Description BLOOD LEFT HAND  Final   Special Requests   Final    BOTTLES DRAWN AEROBIC AND ANAEROBIC Blood Culture results may not be optimal due to an excessive volume of blood received in culture bottles   Culture   Final    NO GROWTH 2 DAYS Performed at [facility], [address], [place], [name] [address]    Report Status PENDING  Incomplete     Studies: Dg Hip Unilat With Pelvis 2-3 Views Right  Result Date:  01/16/2019 CLINICAL DATA:  Right hip pain EXAM: DG HIP (WITH OR WITHOUT PELVIS) 2-3V RIGHT COMPARISON:  None. FINDINGS: Changes of right hip replacement. No hardware complicating feature. No acute bony abnormality. No fracture, subluxation or dislocation. IMPRESSION: Prior right hip replacement.  No acute bony abnormality. Electronically Signed   By: Charlett Nose M.D.   On: 01/16/2019 19:48   Scheduled Meds: . atorvastatin  80 mg Oral q1800  . diltiazem  120 mg Oral Daily  . furosemide  20 mg Intravenous Once  . spironolactone  12.5 mg Oral Daily   Continuous Infusions: .  ceFAZolin (ANCEF) IV 2 g (01/17/19 1434)   Active Problems:   Obstructive sleep apnea   Chronic atrial fibrillation   HTN (hypertension)   Hyperlipemia   Status post gastric bypass for obesity   Vitamin D deficiency   History of Severe aortic stenosis   Varicose veins of right lower extremity with complications   Pulmonary hypertension, unspecified (HCC)   History of subdural hematoma   S/P TAVR (transcatheter aortic valve replacement)   Sepsis (HCC)   Acute renal failure (HCC)   Right shoulder pain  Time spent:  Erick Blinks, MD [facility] 01/17/2019, 6:29 PM    LOS: 4 days

## 2019-01-17 NOTE — Progress Notes (Addendum)
    [facility] has been requested to perform a transesophageal echocardiogram on Polo L Obar for streptococcus bacteremia. He is s/p TAVR in 07/2018.  After careful review of history and examination, the risks and benefits of transesophageal echocardiogram have been explained including risks of esophageal damage, perforation (1:10,000 risk), bleeding, pharyngeal hematoma as well as other potential complications associated with conscious sedation including aspiration, arrhythmia, respiratory failure and death. Alternatives to treatment were discussed, questions were answered. Patient is willing to proceed.   BP has been stable without the requirement for pressor support. By recent labs, WBC elevated to 24.2. Hgb stable at 11.0 and platelets improved to 105K.   Procedure scheduled for 01/18/2019 at 0830 with Dr. Francy Mcilvaine. Anesthesia assistance requested.    Brittany M Strader, PA-C  01/17/2019 10:37 AM   Desmin Daleo G. Tamanna Whitson, M.D., F.A.C.C.   

## 2019-01-17 NOTE — Care Management Important Message (Signed)
Important Message  Patient Details  Name: Joel Rogers MRN: [id number] Date of Birth: 17-Oct-1951   Medicare Important Message Given:  Yes    Corey Harold 01/17/2019, 2:29 PM

## 2019-01-17 NOTE — H&P (View-Only) (Signed)
    [facility] has been requested to perform a transesophageal echocardiogram on Joel Rogers for streptococcus bacteremia. He is s/p TAVR in 07/2018.  After careful review of history and examination, the risks and benefits of transesophageal echocardiogram have been explained including risks of esophageal damage, perforation (1:10,000 risk), bleeding, pharyngeal hematoma as well as other potential complications associated with conscious sedation including aspiration, arrhythmia, respiratory failure and death. Alternatives to treatment were discussed, questions were answered. Patient is willing to proceed.   BP has been stable without the requirement for pressor support. By recent labs, WBC elevated to 24.2. Hgb stable at 11.0 and platelets improved to 105K.   Procedure scheduled for 01/18/2019 at 0830 with Dr. Diona Browner. Anesthesia assistance requested.    Ellsworth Lennox, PA-C  01/17/2019 10:37 AM   Jonelle Sidle, M.D., F.A.C.C.

## 2019-01-18 ENCOUNTER — Encounter (HOSPITAL_COMMUNITY): Payer: Self-pay | Admitting: *Deleted

## 2019-01-18 ENCOUNTER — Inpatient Hospital Stay: Payer: Self-pay

## 2019-01-18 ENCOUNTER — Encounter (HOSPITAL_COMMUNITY)
Admission: EM | Disposition: A | Discharge status: Rehabilitation Facility | Payer: Self-pay | Source: Home / Self Care | Attending: Internal Medicine

## 2019-01-18 ENCOUNTER — Inpatient Hospital Stay (HOSPITAL_COMMUNITY): Payer: Medicare Other

## 2019-01-18 ENCOUNTER — Inpatient Hospital Stay (HOSPITAL_COMMUNITY): Payer: Medicare Other | Admitting: Anesthesiology

## 2019-01-18 DIAGNOSIS — I361 Nonrheumatic tricuspid (valve) insufficiency: Secondary | ICD-10-CM

## 2019-01-18 DIAGNOSIS — R7881 Bacteremia: Secondary | ICD-10-CM

## 2019-01-18 HISTORY — PX: TEE WITHOUT CARDIOVERSION: SHX5443

## 2019-01-18 LAB — CBC WITH DIFFERENTIAL/PLATELET
Abs Immature Granulocytes: 1.55 10*3/uL — ABNORMAL HIGH (ref 0.00–0.07)
Basophils Absolute: 0.1 10*3/uL (ref 0.0–0.1)
Basophils Relative: 0 %
EOS ABS: 0 10*3/uL (ref 0.0–0.5)
Eosinophils Relative: 0 %
HCT: 30.4 % — ABNORMAL LOW (ref 39.0–52.0)
HEMOGLOBIN: 10.4 g/dL — AB (ref 13.0–17.0)
Immature Granulocytes: 7 %
Lymphocytes Relative: 11 %
Lymphs Abs: 2.3 10*3/uL (ref 0.7–4.0)
MCH: 23.3 pg — ABNORMAL LOW (ref 26.0–34.0)
MCHC: 34.2 g/dL (ref 30.0–36.0)
MCV: 68 fL — ABNORMAL LOW (ref 80.0–100.0)
Monocytes Absolute: 1.5 10*3/uL — ABNORMAL HIGH (ref 0.1–1.0)
Monocytes Relative: 7 %
Neutro Abs: 15.4 10*3/uL — ABNORMAL HIGH (ref 1.7–7.7)
Neutrophils Relative %: 75 %
Platelets: 153 10*3/uL (ref 150–400)
RBC: 4.47 MIL/uL (ref 4.22–5.81)
RDW: 15.1 % (ref 11.5–15.5)
WBC: 20.9 10*3/uL — ABNORMAL HIGH (ref 4.0–10.5)
nRBC: 0.1 % (ref 0.0–0.2)

## 2019-01-18 LAB — CULTURE, BLOOD (ROUTINE X 2)
Culture: NO GROWTH
Special Requests: ADEQUATE

## 2019-01-18 LAB — COMPREHENSIVE METABOLIC PANEL
ALT: 15 U/L (ref 0–44)
AST: 26 U/L (ref 15–41)
Albumin: 2.1 g/dL — ABNORMAL LOW (ref 3.5–5.0)
Alkaline Phosphatase: 105 U/L (ref 38–126)
Anion gap: 6 (ref 5–15)
BUN: 51 mg/dL — ABNORMAL HIGH (ref 8–23)
CO2: 18 mmol/L — ABNORMAL LOW (ref 22–32)
Calcium: 8.1 mg/dL — ABNORMAL LOW (ref 8.9–10.3)
Chloride: 112 mmol/L — ABNORMAL HIGH (ref 98–111)
Creatinine, Ser: 1.68 mg/dL — ABNORMAL HIGH (ref 0.61–1.24)
GFR calc Af Amer: 48 mL/min — ABNORMAL LOW (ref >60)
GFR calc non Af Amer: 41 mL/min — ABNORMAL LOW (ref >60)
Glucose, Bld: 95 mg/dL (ref 70–99)
POTASSIUM: 5.1 mmol/L (ref 3.5–5.1)
Sodium: 136 mmol/L (ref 135–145)
Total Bilirubin: 1.4 mg/dL — ABNORMAL HIGH (ref 0.3–1.2)
Total Protein: 6.1 g/dL — ABNORMAL LOW (ref 6.5–8.1)

## 2019-01-18 SURGERY — ECHOCARDIOGRAM, TRANSESOPHAGEAL
Anesthesia: Monitor Anesthesia Care

## 2019-01-18 MED ORDER — KETAMINE HCL 50 MG/5ML IJ SOSY
PREFILLED_SYRINGE | INTRAMUSCULAR | Status: AC
  Filled 2019-01-18: qty 5

## 2019-01-18 MED ORDER — LIDOCAINE 2% (20 MG/ML) 5 ML SYRINGE
INTRAMUSCULAR | Status: AC
  Filled 2019-01-18: qty 5

## 2019-01-18 MED ORDER — LIDOCAINE 2% (20 MG/ML) 5 ML SYRINGE
INTRAMUSCULAR | Status: AC
  Filled 2019-01-18: qty 10

## 2019-01-18 MED ORDER — GLYCOPYRROLATE 0.2 MG/ML IJ SOLN
INTRAMUSCULAR | Status: DC | PRN
  Administered 2019-01-18: 0.2 mg via INTRAVENOUS

## 2019-01-18 MED ORDER — LIDOCAINE HCL (CARDIAC) PF 100 MG/5ML IV SOSY
PREFILLED_SYRINGE | INTRAVENOUS | Status: DC | PRN
  Administered 2019-01-18: 40 mg via INTRAVENOUS

## 2019-01-18 MED ORDER — SODIUM CHLORIDE 0.9% FLUSH: 10.0000 mL | INTRAVENOUS | Status: DC | PRN

## 2019-01-18 MED ORDER — PROPOFOL 500 MG/50ML IV EMUL
INTRAVENOUS | Status: DC | PRN
  Administered 2019-01-18: 150 ug/kg/min via INTRAVENOUS

## 2019-01-18 MED ORDER — LACTATED RINGERS IV SOLN
INTRAVENOUS | Status: DC
  Administered 2019-01-18: 1000 mL via INTRAVENOUS

## 2019-01-18 MED ORDER — GLYCOPYRROLATE PF 0.2 MG/ML IJ SOSY
PREFILLED_SYRINGE | INTRAMUSCULAR | Status: AC
  Filled 2019-01-18: qty 1

## 2019-01-18 MED ORDER — PROPOFOL 10 MG/ML IV BOLUS
INTRAVENOUS | Status: AC
  Filled 2019-01-18: qty 60

## 2019-01-18 MED ORDER — KETAMINE HCL 10 MG/ML IJ SOLN
INTRAMUSCULAR | Status: DC | PRN
  Administered 2019-01-18 (×2): 10 mg via INTRAVENOUS

## 2019-01-18 MED ORDER — SODIUM CHLORIDE 0.9% FLUSH
10.0000 mL | Freq: Two times a day (BID) | INTRAVENOUS | Status: DC
  Administered 2019-01-18 – 2019-01-20 (×4): 10 mL
  Administered 2019-01-20 – 2019-01-21 (×2): 20 mL
  Administered 2019-01-21 – 2019-01-30 (×16): 10 mL

## 2019-01-18 NOTE — Progress Notes (Signed)
PROGRESS NOTE  Joel Rogers  WLS:[id number]  DOB: [birth year]  DOA: 01/13/2019 PCP: [name], MD  Brief Admission Hx: 68 y/o male presented to the emergency department with right shoulder pain, fever tachycardia and cellulitis of the right lower extremity.  MDM/Assessment & Plan:   1. Severe sepsis- Resolved now.  The patient was treated with sepsis bundle and his lactic acid has normalized.  De-escalating antibiotics.  His blood cultures positive for group C strep.  Change antibiotics to Ancef.  2. Streptococcus bacteremia- unfortunately he does have a bioprosthetic aortic valve and is now at risk for vegetations .  Repeat blood cultures on 2/24 have not shown any growth.  2D echocardiogram does not show any evidence of vegetations.  Discussed with infectious disease who recommended TEE.  TEE performed on 2/27 did not show any evidence of vegetations.  We will continue with a two-week course of Ancef.  PICC line to be placed today. 3. Acute renal failure- creatinine has been trending up.  Hold off on further IV fluids.  He has fair urine output.  Continue to monitor.. 4. Cellulitis of RLE - due to streptococcus infection, clinically looking better, [place] RLE negative for DVT.  5. Chronic atrial fibrillation- temporarily holding Eliquis secondary to thrombocytopenia, continue Cardizem for rate control. 6. Thrombocytopenia- likely secondary to severe sepsis, platelet count slowly improving, no spontaneous bleeding, continue to follow.  Holding Eliquis temporarily. 7. Leukocytosis - marked bump in WBC - clinically patient is improving.  WBC count trending down with IV antibiotics. 8. Status post TAVR for severe aortic stenosis-he is followed by Dr. Allyson Sabal.  He had been doing fairly well since surgery.   9. Acute on chronic right shoulder pain- plain film x-rays only showed arthritic changes in the right shoulder.   10. Essential hypertension- blood pressures currently stable and controlled.   Follow. 11. Hyperbilirubinemia.  Trending down with IV fluids since admission.  Right upper quadrant ultrasound is unremarkable. 12. Right arm swelling.  Check venous Dopplers to rule out DVT.  DVT prophylaxis: SCDs Code Status: Full Family Communication: Patient son and sister updated at the bedside Disposition Plan: Skilled nursing facility on discharge.  Consultants:  Cardiology for TEE  Procedures: Echo: 1. The left ventricle has normal systolic function with an ejection fraction of 60-65%. The cavity size was normal. There is mildly increased left ventricular wall thickness. Left ventricular diastolic Doppler parameters are indeterminate. No evidence  of left ventricular regional wall motion abnormalities.  2. The right ventricle has moderately reduced systolic function. The cavity was mildly enlarged. There is no increase in right ventricular wall thickness. Right ventricular systolic pressure is moderately elevated with an estimated pressure of 41.7  mmHg.  3. Left atrial size was severely dilated.  4. Right atrial size was severely dilated.  5. The mitral valve is normal in structure. There is mild mitral annular calcification present.  6. The tricuspid valve is normal in structure.  7. A 26 Edwards Sapien bioprosthetic aortic valve (TAVR) valve is present in the aortic position. Normal function observed with mean gradient 15.7 mmHg. No perivalvular leak.  8. The pulmonic valve was grossly normal. Pulmonic valve regurgitation is mild by color flow Doppler.  9. The aortic root is normal in size and structure. 10. The inferior vena cava was dilated in size with <50% respiratory variability.  11. No evidence of left ventricular regional wall motion abnormalities.  Antimicrobials:  Cefepime 2/22-2/24  Vancomycin 2/22 >> 2/25  Metronidazole 2/22 >2/13  Ancef 2/25 >  Subjective: He does not have any new complaints.  Still has some discomfort in his right shoulder which is  better with movement.  Still has difficulty lifting up his right arm.  Objective: Vitals:   01/18/19 0915 01/18/19 0925 01/18/19 0930 01/18/19 0940  BP: 112/75 122/74 130/74 114/66  Pulse: (!) 102 77 80 79  Resp: (!) 23 (!) 22 19 20   Temp:      TempSrc:      SpO2: 96% 97% 97% 95%  Weight:      Height:        Intake/Output Summary (Last 24 hours) at 01/18/2019 1548 Last data filed at 01/18/2019 [id number] Gross per 24 hour  Intake 950 ml  Output 1150 ml  Net -200 ml   Filed Weights   01/13/19 1356  Weight: 110.7 kg   REVIEW OF SYSTEMS  As per history otherwise all reviewed and reported negative  Exam:  General exam: Alert, awake, oriented x 3 Respiratory system: Clear bilaterally. Respiratory effort normal. Cardiovascular system:RRR. No murmurs, rubs, gallops. Gastrointestinal system: Abdomen is nondistended, soft and nontender. No organomegaly or masses felt. Normal bowel sounds heard. Central nervous system: Alert and oriented. No focal neurological deficits. Extremities: No erythema, swelling, joint effusion appreciated in right shoulder.  No pain with passive motion.  Right arm is more edematous than left.  No erythema, warmth or tenderness to palpation noted over right hip. Skin: Erythema in right lower extremity is improving Psychiatry: Judgement and insight appear normal. Mood & affect appropriate.     Basic Metabolic Panel: Recent Labs  Lab 01/13/19 1947 01/14/19 0618 01/15/19 0415 01/16/19 0427 01/17/19 0418 01/18/19 0424  NA  --  136 139 139 138 136  K  --  3.8 4.2 4.7 4.9 5.1  CL  --  107 112* 112* 112* 112*  CO2  --  18* 19* 19* 19* 18*  GLUCOSE  --  117* 99 92 100* 95  BUN  --  40* 46* 53* 51* 51*  CREATININE  --  1.57* 1.40* 1.50* 1.45* 1.68*  CALCIUM  --  8.1* 8.1* 8.2* 8.2* 8.1*  MG 1.9  --  2.8* 3.0*  --   --    Liver Function Tests: Recent Labs  Lab 01/13/19 1550 01/15/19 0415 01/16/19 0427 01/17/19 0418 01/18/19 0424  AST 53* 43* 38 30 26   ALT 30 29 28 24 15   ALKPHOS 115 90 96 105 105  BILITOT 3.2* 2.0* 2.3* 1.6* 1.4*  PROT 7.0 5.6* 6.2* 6.0* 6.1*  ALBUMIN 3.1* 2.3* 2.4* 2.3* 2.1*   No results for input(s): LIPASE, AMYLASE in the last 168 hours. No results for input(s): AMMONIA in the last 168 hours. CBC: Recent Labs  Lab 01/13/19 1550 01/14/19 0618 01/15/19 0415 01/16/19 0427 01/17/19 0418 01/18/19 0424  WBC 21.1* 24.8* 31.0* 26.2* 24.2* 20.9*  NEUTROABS 19.5*  --  27.1* 22.4* 19.4* 15.4*  HGB 12.5* 12.2* 11.6* 11.5* 11.0* 10.4*  HCT 37.4* 36.4* 34.0* 33.8* 31.7* 30.4*  MCV 71.0* 70.3* 70.0* 68.6* 69.7* 68.0*  PLT 88* 66* 62* 78* 105* 153   Cardiac Enzymes: No results for input(s): CKTOTAL, CKMB, CKMBINDEX, TROPONINI in the last 168 hours. CBG (last 3)  No results for input(s): GLUCAP in the last 72 hours. Recent Results (from the past 240 hour(s))  Culture, blood (routine x 2)     Status: None   Collection Time: 01/13/19  3:51 PM  Result Value Ref Range Status   Specimen  Description BLOOD LEFT ARM  Final   Special Requests   Final    BOTTLES DRAWN AEROBIC ONLY Blood Culture adequate volume   Culture   Final    NO GROWTH 5 DAYS Performed at [facility], [address] [place], [name] [address]    Report Status 01/18/2019 FINAL  Final  Urine culture     Status: Abnormal   Collection Time: 01/13/19  5:40 PM  Result Value Ref Range Status   Specimen Description   Final    URINE, CATHETERIZED Performed at [facility], [address], [place], [name] [address]    Special Requests   Final    NONE Performed at [facility], [address] [place], [name] [address]    Culture (A)  Final    <10,000 COLONIES/mL INSIGNIFICANT GROWTH Performed at [facility], [address] [place], [name] [address]    Report Status 01/15/2019 FINAL  Final  Culture, blood (routine x 2)     Status: Abnormal   Collection Time: 01/13/19  7:47 PM  Result Value Ref Range Status   Specimen Description   Final     BLOOD LEFT ARM Performed at [facility], [address] [place], [name] [address]    Special Requests   Final    BOTTLES DRAWN AEROBIC AND ANAEROBIC Blood Culture adequate volume Performed at [facility], [address] [place], [name] [address]    Culture  Setup Time   Final    GRAM POSITIVE COCCI IN CHAINS Gram Stain Report Called to,Read Back By and Verified With: BOUDERANT,R @ 0516 ON 01/14/19 BY JUW GS DONE @ [facility] CRITICAL RESULT CALLED TO, READ BACK BY AND VERIFIED WITH: S. HALL, PHARMD ([facility]) AT 1010 ON 01/14/19 BY C. JESSUP, MLT. Performed at [facility], [address], [place], [name] [address]    Culture STREPTOCOCCUS GROUP C (A)  Final   Report Status 01/16/2019 FINAL  Final   Organism ID, Bacteria STREPTOCOCCUS GROUP C  Final      Susceptibility   Streptococcus group c - MIC*    CLINDAMYCIN <=0.25 SENSITIVE Sensitive     AMPICILLIN <=0.25 SENSITIVE Sensitive     ERYTHROMYCIN <=0.12 SENSITIVE Sensitive     VANCOMYCIN <=0.12 SENSITIVE Sensitive     CEFTRIAXONE <=0.12 SENSITIVE Sensitive     LEVOFLOXACIN 0.5 SENSITIVE Sensitive     PENICILLIN Value in next row Sensitive      SENSITIVE<=0.06    * STREPTOCOCCUS GROUP C  Blood Culture ID Panel (Reflexed)     Status: Abnormal   Collection Time: 01/13/19  7:47 PM  Result Value Ref Range Status   Enterococcus species NOT DETECTED NOT DETECTED Final   Listeria monocytogenes NOT DETECTED NOT DETECTED Final   Staphylococcus species NOT DETECTED NOT DETECTED Final   Staphylococcus aureus (BCID) NOT DETECTED NOT DETECTED Final   Streptococcus species DETECTED (A) NOT DETECTED Final    Comment: Not Enterococcus species, Streptococcus agalactiae, Streptococcus pyogenes, or Streptococcus pneumoniae. CRITICAL RESULT CALLED TO, READ BACK BY AND VERIFIED WITH: S. HALL, PHARMD ([facility]) AT 1010 ON 01/14/19 BY C. JESSUP, MLT.    Streptococcus agalactiae NOT DETECTED NOT DETECTED Final   Streptococcus pneumoniae NOT DETECTED NOT  DETECTED Final   Streptococcus pyogenes NOT DETECTED NOT DETECTED Final   Acinetobacter baumannii NOT DETECTED NOT DETECTED Final   Enterobacteriaceae species NOT DETECTED NOT DETECTED Final   Enterobacter cloacae complex NOT DETECTED NOT DETECTED Final   Escherichia coli NOT DETECTED  NOT DETECTED Final   Klebsiella oxytoca NOT DETECTED NOT DETECTED Final   Klebsiella pneumoniae NOT DETECTED NOT DETECTED Final   Proteus species NOT DETECTED NOT DETECTED Final   Serratia marcescens NOT DETECTED NOT DETECTED Final   Haemophilus influenzae NOT DETECTED NOT DETECTED Final   Neisseria meningitidis NOT DETECTED NOT DETECTED Final   Pseudomonas aeruginosa NOT DETECTED NOT DETECTED Final   Candida albicans NOT DETECTED NOT DETECTED Final   Candida glabrata NOT DETECTED NOT DETECTED Final   Candida krusei NOT DETECTED NOT DETECTED Final   Candida parapsilosis NOT DETECTED NOT DETECTED Final   Candida tropicalis NOT DETECTED NOT DETECTED Final    Comment: Performed at [facility], [address] [place], [name] [address]  Culture, blood (Routine X 2) w Reflex to ID Panel     Status: None (Preliminary result)   Collection Time: 01/15/19  8:52 AM  Result Value Ref Range Status   Specimen Description BLOOD LEFT FOREARM  Final   Special Requests   Final    BOTTLES DRAWN AEROBIC AND ANAEROBIC Blood Culture adequate volume   Culture   Final    NO GROWTH 3 DAYS Performed at [facility], [address] [place], [name] [address]    Report Status PENDING  Incomplete  Culture, blood (Routine X 2) w Reflex to ID Panel     Status: None (Preliminary result)   Collection Time: 01/15/19  9:05 AM  Result Value Ref Range Status   Specimen Description BLOOD LEFT HAND  Final   Special Requests   Final    BOTTLES DRAWN AEROBIC AND ANAEROBIC Blood Culture results may not be optimal due to an excessive volume of blood received in culture bottles   Culture   Final    NO GROWTH 3 DAYS Performed at  [facility], [address], [place], [name] [address]    Report Status PENDING  Incomplete     Studies: US Venous Img Upper Uni Right  Result Date: 01/18/2019 CLINICAL DATA:  Right upper extremity edema. History of diabetes. Evaluate DVT. EXAM: RIGHT UPPER EXTREMITY VENOUS DOPPLER ULTRASOUND TECHNIQUE: Gray-scale sonography with graded compression, as well as color Doppler and duplex ultrasound were performed to evaluate the upper extremity deep venous system from the level of the subclavian vein and including the jugular, axillary, basilic, radial, ulnar and upper cephalic vein. Spectral Doppler was utilized to evaluate flow at rest and with distal augmentation maneuvers. COMPARISON:  None. FINDINGS: Contralateral Subclavian Vein: Respiratory phasicity is normal and symmetric with the symptomatic side. No evidence of thrombus. Normal compressibility. Internal Jugular Vein: No evidence of thrombus. Normal compressibility, respiratory phasicity and response to augmentation. Subclavian Vein: No evidence of thrombus. Normal compressibility, respiratory phasicity and response to augmentation. Axillary Vein: No evidence of thrombus. Normal compressibility, respiratory phasicity and response to augmentation. Cephalic Vein: No evidence of thrombus. Normal compressibility, respiratory phasicity and response to augmentation. Basilic Vein: No evidence of thrombus. Normal compressibility, respiratory phasicity and response to augmentation. Brachial Veins: No evidence of thrombus. Normal compressibility, respiratory phasicity and response to augmentation. Radial Veins: No evidence of thrombus. Normal compressibility, respiratory phasicity and response to augmentation. Ulnar Veins: No evidence of thrombus. Normal compressibility, respiratory phasicity and response to augmentation. Venous Reflux:  None visualized. Other Findings:  None visualized. IMPRESSION: No evidence of DVT within the right upper extremity.  Electronically Signed   By: Simonne Come M.D.   On: 01/18/2019 15:10   Dg Hip Unilat With Pelvis 2-3 Views  Right  Result Date: 01/16/2019 CLINICAL DATA:  Right hip pain EXAM: DG HIP (WITH OR WITHOUT PELVIS) 2-3V RIGHT COMPARISON:  None. FINDINGS: Changes of right hip replacement. No hardware complicating feature. No acute bony abnormality. No fracture, subluxation or dislocation. IMPRESSION: Prior right hip replacement.  No acute bony abnormality. Electronically Signed   By: Charlett Nose M.D.   On: 01/16/2019 19:48   US Abdomen Limited Ruq  Result Date: 01/18/2019 CLINICAL DATA:  Increased liver function tests. EXAM: ULTRASOUND ABDOMEN LIMITED RIGHT UPPER QUADRANT COMPARISON:  CT abdomen pelvis July 11, 2018 FINDINGS: Gallbladder: No gallstones or wall thickening visualized. The gallbladder is distended. No sonographic Murphy sign noted by sonographer. Common bile duct: Diameter: 5 mm Liver: No focal lesion identified. Within normal limits in parenchymal echogenicity. Nodular contour of the liver is noted. Portal vein is patent on color Doppler imaging with normal direction of blood flow towards the liver. IMPRESSION: No evidence of acute cholecystitis. Nodular contour of liver, this can be seen in cirrhosis of liver. Electronically Signed   By: Sherian Rein M.D.   On: 01/18/2019 12:22   Scheduled Meds: . atorvastatin  80 mg Oral q1800  . diltiazem  120 mg Oral Daily  . spironolactone  12.5 mg Oral Daily   Continuous Infusions: .  ceFAZolin (ANCEF) IV 2 g (01/18/19 1522)   Active Problems:   Obstructive sleep apnea   Chronic atrial fibrillation   HTN (hypertension)   Hyperlipemia   Status post gastric bypass for obesity   Vitamin D deficiency   History of Severe aortic stenosis   Varicose veins of right lower extremity with complications   Pulmonary hypertension, unspecified (HCC)   History of subdural hematoma   S/P TAVR (transcatheter aortic valve replacement)   Sepsis (HCC)    Acute renal failure (HCC)   Right shoulder pain  Time spent:  Erick Blinks, MD [facility] 01/18/2019, 3:48 PM    LOS: 5 days

## 2019-01-18 NOTE — CV Procedure (Signed)
Transesophageal echocardiogram  Indication: Bacteremia, evaluate for valvular vegetations  Procedure: Informed consent was obtained and the patient was taken to the procedure suite where a timeout was performed.  Deep sedation was provided with propofol per the Anesthesia service.  Patient was placed in the left lateral decubitus position, bite block in place.  Multiplane transesophageal echocardiogram probe was inserted in the esophagus with multiple images obtained.  Findings are as follows:   1. The left ventricle has normal systolic function, with an ejection fraction of 60-65%. No evidence of left ventricular regional wall motion abnormalities.  2. No evidence of left ventricular regional wall motion abnormalities.  3. Left atrial size was severely dilated. No left atrial appendage thrombus noted.  4. Right atrial size was severely dilated. No right atrial appendage thrombus noted.  5. The mitral valve is normal in structure. There is mild to moderate mitral annular calcification present.  6. The tricuspid valve was normal in structure. Tricuspid valve regurgitation is mild-moderate.  7. A 26 Edwards Sapien bioprosthetic aortic valve (TAVR) valve is present in the aortic position.  8. The right ventricle has moderately reduced systolic function. The cavity was mildly enlarged.  9. There is evidence of mild plaque in the descending aorta. 10. No valvular vegetations are identified.  Patient tolerated the procedure well without immediate complications.  Jonelle Sidle, M.D., F.A.C.C.

## 2019-01-18 NOTE — Progress Notes (Signed)
Report received from [place] at this time 

## 2019-01-18 NOTE — Progress Notes (Signed)
Physical Therapy Treatment Patient Details Name: Joel Rogers MRN: [id number] DOB: 14-Aug-1951 Today's Date: 01/18/2019    History of Present Illness Joel Rogers is a 68 y.o. male with medical history significant for diastolic CHF, aortic stenosis status post TAVR, atrial fibrillation on anticoagulation, OSA, HTN, pulmonary hypertension, who presented to the ED with complaints of right shoulder pain over the past 3 to 4 days, but patient has chronic problems with his right shoulder and at baseline he is unable to move it around much.  He also has been feeling like he had the flu for the past 5 days.    PT Comments    Patient states he did not received any medication this AM and states he feels more alert for therapy.  Patient demonstrates slow labored movement for sitting up at bedside with limited use of RUE due to shoulder pain, weakness and c/o severe right hip pain.  Patient tolerated sitting up at bedside for approximately 15 minutes with frequent leaning falling backwards due to weak core, some improvement noted for maintaining sitting balance after leaning forward to stretch low back muscles.  Patient unable to laterally scoot or sit to stand due to weakness and demonstrated fair/good return for using LUE and LLE to help pull self up in bed with bed in head down position.  Patient will benefit from continued physical therapy in hospital and recommended venue below to increase strength, balance, endurance for safe ADLs and gait.    Follow Up Recommendations  SNF     Equipment Recommendations  None recommended by PT    Recommendations for Other Services       Precautions / Restrictions Precautions Precautions: Fall Restrictions Weight Bearing Restrictions: No    Mobility  Bed Mobility Overal bed mobility: Needs Assistance Bed Mobility: Supine to Sit;Sit to Supine     Supine to sit: Max assist Sit to supine: Mod assist;Max assist   General bed mobility comments: head  of bed raised, limited use of RUE  Transfers                    Ambulation/Gait                 Stairs             Wheelchair Mobility    Modified Rankin (Stroke Patients Only)       Balance Overall balance assessment: Needs assistance Sitting-balance support: Feet supported;Bilateral upper extremity supported Sitting balance-Leahy Scale: Poor Sitting balance - Comments: frequent leaning backwards due to fatigue Postural control: Posterior lean                                  Cognition Arousal/Alertness: Awake/alert Behavior During Therapy: WFL for tasks assessed/performed Overall Cognitive Status: Within Functional Limits for tasks assessed                                        Exercises General Exercises - Lower Extremity Long Arc Quad: Seated;Strengthening;Both;10 reps;AAROM Hip Flexion/Marching: Seated;Strengthening;AAROM;Both;10 reps Toe Raises: Seated;Strengthening;AROM;Both;10 reps Heel Raises: Seated;AROM;Strengthening;Both;10 reps    General Comments        Pertinent Vitals/Pain Pain Assessment: Faces Faces Pain Scale: Hurts even more Pain Location: right shoulder and right hip Pain Descriptors / Indicators: Sore;Grimacing Pain Intervention(s): Limited activity within patient's tolerance;Monitored during session  Home Living                      Prior Function            PT Goals (current goals can now be found in the care plan section) Acute Rehab PT Goals Patient Stated Goal: return home after rehab PT Goal Formulation: With patient/family Time For Goal Achievement: 01/29/19 Potential to Achieve Goals: Fair Progress towards PT goals: Progressing toward goals    Frequency    Min 3X/week      PT Plan Current plan remains appropriate    Co-evaluation              AM-PAC PT "6 Clicks" Mobility   Outcome Measure  Help needed turning from your back to your side  while in a flat bed without using bedrails?: A Lot Help needed moving from lying on your back to sitting on the side of a flat bed without using bedrails?: A Lot Help needed moving to and from a bed to a chair (including a wheelchair)?: Total Help needed standing up from a chair using your arms (e.g., wheelchair or bedside chair)?: Total Help needed to walk in hospital room?: Total Help needed climbing 3-5 steps with a railing? : Total 6 Click Score: 8    End of Session   Activity Tolerance: Patient limited by fatigue;Patient tolerated treatment well Patient left: in bed;with call bell/phone within reach;with family/visitor present Nurse Communication: Mobility status PT Visit Diagnosis: Unsteadiness on feet (R26.81);Other abnormalities of gait and mobility (R26.89);Muscle weakness (generalized) (M62.81)     Time: 5643-3295 PT Time Calculation (min) (ACUTE ONLY): 25 min  Charges:  $Therapeutic Exercise: 8-22 mins $Therapeutic Activity: 8-22 mins                     2:12 PM, 01/18/19 [name], MPT Physical Therapist with [facility] 336 [phone] office [phone] mobile phone

## 2019-01-18 NOTE — Anesthesia Procedure Notes (Signed)
Procedure Name: MAC Date/Time: 01/18/2019 8:32 AM Performed by: Andree Elk Avyon Herendeen A, CRNA Pre-anesthesia Checklist: Patient identified, Emergency Drugs available, Suction available, Timeout performed and Patient being monitored Patient Re-evaluated:Patient Re-evaluated prior to induction Oxygen Delivery Method: Nasal Cannula

## 2019-01-18 NOTE — Transfer of Care (Signed)
Immediate Anesthesia Transfer of Care Note  Patient: Joel Rogers  Procedure(s) Performed: TRANSESOPHAGEAL ECHOCARDIOGRAM (TEE) WITH PROPOFOL (N/A )  Patient Location: PACU  Anesthesia Type:MAC  Level of Consciousness: awake, alert , oriented and patient cooperative  Airway & Oxygen Therapy: Patient Spontanous Breathing and Patient connected to nasal cannula oxygen  Post-op Assessment: Report given to RN and Post -op Vital signs reviewed and stable  Post vital signs: Reviewed and stable  Last Vitals:  Vitals Value Taken Time  BP    Temp    Pulse    Resp    SpO2      Last Pain:  Vitals:   01/18/19 0742  TempSrc: Oral  PainSc: 1       Patients Stated Pain Goal: 7 (09/47/09 6283)  Complications: No apparent anesthesia complications

## 2019-01-18 NOTE — Interval H&P Note (Signed)
History and Physical Interval Note:  01/18/2019 9:06 AM  Patient presents for elective TEE in the setting of streptococcus bacteremia. Informed consent obtained and he is in agreement to proceed.  Jonelle Sidle, M.D., F.A.C.C.

## 2019-01-18 NOTE — Anesthesia Preprocedure Evaluation (Signed)
Anesthesia Evaluation  Patient identified by MRN, date of birth, ID band Patient awake    Reviewed: Allergy & Precautions, H&P , NPO status , Patient's Chart, lab work & pertinent test results  Airway Mallampati: II  TM Distance: >3 FB Neck ROM: full    Dental no notable dental hx.    Pulmonary sleep apnea , former smoker,    Pulmonary exam normal breath sounds clear to auscultation       Cardiovascular Exercise Tolerance: Good hypertension, +CHF   Rhythm:regular Rate:Normal  S/p TAVR   Neuro/Psych negative neurological ROS  negative psych ROS   GI/Hepatic negative GI ROS, Neg liver ROS,   Endo/Other  negative endocrine ROS  Renal/GU Renal disease  negative genitourinary   Musculoskeletal   Abdominal   Peds  Hematology  (+) Blood dyscrasia, anemia ,   Anesthesia Other Findings   Reproductive/Obstetrics negative OB ROS                             Anesthesia Physical Anesthesia Plan  ASA: IV  Anesthesia Plan: MAC   Post-op Pain Management:    Induction:   PONV Risk Score and Plan:   Airway Management Planned:   Additional Equipment:   Intra-op Plan:   Post-operative Plan:   Informed Consent: I have reviewed the patients History and Physical, chart, labs and discussed the procedure including the risks, benefits and alternatives for the proposed anesthesia with the patient or authorized representative who has indicated his/her understanding and acceptance.     Dental Advisory Given  Plan Discussed with: CRNA  Anesthesia Plan Comments:         Anesthesia Quick Evaluation

## 2019-01-18 NOTE — Progress Notes (Signed)
Peripherally Inserted Central Catheter/Midline Placement  The IV Nurse has discussed with the patient and/or persons authorized to consent for the patient, the purpose of this procedure and the potential benefits and risks involved with this procedure.  The benefits include less needle sticks, lab draws from the catheter, and the patient may be discharged home with the catheter. Risks include, but not limited to, infection, bleeding, blood clot (thrombus formation), and puncture of an artery; nerve damage and irregular heartbeat and possibility to perform a PICC exchange if needed/ordered by physician.  Alternatives to this procedure were also discussed.  Bard Power PICC patient education guide, fact sheet on infection prevention and patient information card has been provided to patient /or left at bedside.    PICC/Midline Placement Documentation  PICC Single Lumen 01/18/19 PICC Right Basilic 48 cm 0 cm (Active)  Indication for Insertion or Continuance of Line Home intravenous therapies (PICC only) 01/18/2019  7:00 PM  Exposed Catheter (cm) 0 cm 01/18/2019  7:00 PM  Site Assessment Clean;Dry;Intact 01/18/2019  7:00 PM  Line Status Flushed;Blood return noted 01/18/2019  7:00 PM  Dressing Type Transparent 01/18/2019  7:00 PM  Dressing Status Clean;Dry;Intact;Antimicrobial disc in place 01/18/2019  7:00 PM  Dressing Change Due 01/25/19 01/18/2019  7:00 PM       Stacie Glaze Horton 01/18/2019, 7:27 PM

## 2019-01-18 NOTE — Progress Notes (Signed)
*  PRELIMINARY RESULTS* Echocardiogram Echocardiogram Transesophageal has been performed.  Stacey Drain 01/18/2019, 9:04 AM

## 2019-01-18 NOTE — Anesthesia Postprocedure Evaluation (Signed)
Anesthesia Post Note  Patient: Joel Rogers  Procedure(s) Performed: TRANSESOPHAGEAL ECHOCARDIOGRAM (TEE) WITH PROPOFOL (N/A )  Patient location during evaluation: PACU Anesthesia Type: MAC Level of consciousness: awake and alert and oriented Pain management: pain level controlled Vital Signs Assessment: post-procedure vital signs reviewed and stable Respiratory status: spontaneous breathing Cardiovascular status: stable Postop Assessment: no apparent nausea or vomiting     Last Vitals:  Vitals:   01/18/19 0520 01/18/19 0742  BP: 132/74 130/82  Pulse: (!) 59 71  Resp: 20 (!) 21  Temp: 37.1 C 37.2 C  SpO2: 99% 99%    Last Pain:  Vitals:   01/18/19 0742  TempSrc: Oral  PainSc: 1                  [name] A

## 2019-01-19 ENCOUNTER — Inpatient Hospital Stay (HOSPITAL_COMMUNITY): Payer: Medicare Other

## 2019-01-19 DIAGNOSIS — I639 Cerebral infarction, unspecified: Secondary | ICD-10-CM | POA: Diagnosis present

## 2019-01-19 LAB — CBC WITH DIFFERENTIAL/PLATELET
Abs Immature Granulocytes: 0.89 10*3/uL — ABNORMAL HIGH (ref 0.00–0.07)
Basophils Absolute: 0.1 10*3/uL (ref 0.0–0.1)
Basophils Relative: 0 %
EOS ABS: 0.1 10*3/uL (ref 0.0–0.5)
Eosinophils Relative: 0 %
HCT: 29.8 % — ABNORMAL LOW (ref 39.0–52.0)
Hemoglobin: 10 g/dL — ABNORMAL LOW (ref 13.0–17.0)
Immature Granulocytes: 5 %
Lymphocytes Relative: 10 %
Lymphs Abs: 1.7 10*3/uL (ref 0.7–4.0)
MCH: 23.6 pg — ABNORMAL LOW (ref 26.0–34.0)
MCHC: 33.6 g/dL (ref 30.0–36.0)
MCV: 70.3 fL — ABNORMAL LOW (ref 80.0–100.0)
Monocytes Absolute: 2.1 10*3/uL — ABNORMAL HIGH (ref 0.1–1.0)
Monocytes Relative: 12 %
NEUTROS PCT: 73 %
Neutro Abs: 12.5 10*3/uL — ABNORMAL HIGH (ref 1.7–7.7)
Platelets: 224 10*3/uL (ref 150–400)
RBC: 4.24 MIL/uL (ref 4.22–5.81)
RDW: 15.4 % (ref 11.5–15.5)
WBC Morphology: INCREASED
WBC: 17.3 10*3/uL — ABNORMAL HIGH (ref 4.0–10.5)
nRBC: 0.1 % (ref 0.0–0.2)

## 2019-01-19 LAB — COMPREHENSIVE METABOLIC PANEL
ALK PHOS: 102 U/L (ref 38–126)
ALT: 7 U/L (ref 0–44)
AST: 26 U/L (ref 15–41)
Albumin: 2 g/dL — ABNORMAL LOW (ref 3.5–5.0)
Anion gap: 6 (ref 5–15)
BUN: 51 mg/dL — ABNORMAL HIGH (ref 8–23)
CALCIUM: 8 mg/dL — AB (ref 8.9–10.3)
CO2: 19 mmol/L — ABNORMAL LOW (ref 22–32)
Chloride: 110 mmol/L (ref 98–111)
Creatinine, Ser: 1.6 mg/dL — ABNORMAL HIGH (ref 0.61–1.24)
GFR calc Af Amer: 51 mL/min — ABNORMAL LOW (ref >60)
GFR calc non Af Amer: 44 mL/min — ABNORMAL LOW (ref >60)
Glucose, Bld: 106 mg/dL — ABNORMAL HIGH (ref 70–99)
Potassium: 5 mmol/L (ref 3.5–5.1)
Sodium: 135 mmol/L (ref 135–145)
Total Bilirubin: 1.3 mg/dL — ABNORMAL HIGH (ref 0.3–1.2)
Total Protein: 6.3 g/dL — ABNORMAL LOW (ref 6.5–8.1)

## 2019-01-19 MED ORDER — STROKE: EARLY STAGES OF RECOVERY BOOK
Freq: Once | Status: DC
  Filled 2019-01-19: qty 1

## 2019-01-19 MED ORDER — ASPIRIN 325 MG PO TABS
325.0000 mg | ORAL_TABLET | Freq: Every day | ORAL | Status: DC
  Administered 2019-01-19 – 2019-01-20 (×2): 325 mg via ORAL
  Filled 2019-01-19 (×2): qty 1

## 2019-01-19 NOTE — Care Management Important Message (Signed)
Important Message  Patient Details  Name: Joel Rogers MRN: [id number] Date of Birth: 1951/01/31   Medicare Important Message Given:  Yes    Corey Harold 01/19/2019, 4:11 PM

## 2019-01-19 NOTE — Progress Notes (Signed)
Pharmacy Antibiotic Note  Today is day #4 of cefazolin therapy (out of 14 days total) for this 68 yo  male admitted on 01/13/2019 with bacteremia.  Blood cultures grew out Strep Group C and TEE on 2/27 showed no vegetations.  Patient is currently afebrile and his WBC count is down to 17.3K.  Plan: Continue cefazolin 2gm IV q8h for a total of 2 weeks F/U cxs and clinical progress  Monitor V/S, labs  Height: 6' (182.9 cm) Weight: 244 lb (110.7 kg) IBW/kg (Calculated) : 77.6  Temp (24hrs), Avg:98 F (36.7 C), Min:97.6 F (36.4 C), Max:98.4 F (36.9 C)  Recent Labs  Lab 01/13/19 1550 01/13/19 1947 01/13/19 2337 01/14/19 0618 01/14/19 0932 01/15/19 0415 01/16/19 0427 01/17/19 0418 01/18/19 0424 01/19/19 0526  WBC 21.1*  --   --  24.8*  --  31.0* 26.2* 24.2* 20.9* 17.3*  CREATININE 1.55*  --   --  1.57*  --  1.40* 1.50* 1.45* 1.68* 1.60*  LATICACIDVEN 3.2* 2.4* 2.6* 1.7 1.5  --   --   --   --   --     Estimated Creatinine Clearance: 56.8 mL/min (A) (by C-G formula based on SCr of 1.6 mg/dL (H)).    No Known Allergies  Antimicrobials this admission: Cefazolin 2/25 >>  Vanco 2/23 >>2/25 Cefepime 2/23 >>2/24 Flagyl 2/23 >> 2/24   Microbiology results: 2/24 BloodCx: ng x4 days 2/22 BloodCx: GrpC strep pan sensitive 2/24 UCx: <10,000 CFU/ml, insignificant growth  Thank you for allowing pharmacy to be a part of this patient's care.  Tama High 01/19/2019 8:54 AM

## 2019-01-19 NOTE — Progress Notes (Signed)
Patient transferring to [facility] [id number]. Called report to receiving nurse. Awaiting [facility] transport. Earnstine Regal, RN

## 2019-01-19 NOTE — Progress Notes (Signed)
PROGRESS NOTE  TRES PARLETTE  ENI:[id number]  DOB: [birth year]  DOA: 01/13/2019 PCP: [name], MD  Brief Admission Hx: 68 y/o male admitted to the hospital with sepsis and streptococcal bacteremia secondary to right lower extremity cellulitis.  He has been treated with intravenous antibiotics overall sepsis is improved.  Since he has a history of bioprosthetic aortic valve, TEE was performed that did not show any evidence of vegetations.  Since admission, has had persistent right-sided weakness.  His limited range of motion was initially felt to be related to a fall that he had prior to admission where pain was limiting his motion.  Since his weakness persisted, he underwent MRI brain that showed bilateral infarcts.  Case was discussed with neurology at [facility] who agreed with transfer for further work-up.  MDM/Assessment & Plan:   1. Severe sepsis- Resolved now.  The patient was treated with sepsis bundle and his lactic acid has normalized.  His blood cultures positive for group C strep.  He is currently on Ancef.  2. Streptococcus bacteremia- source is secondary to right lower extremity cellulitis.  He did complain of some pain in his right shoulder, but this joint does not appear to be erythematous, tender, swollen.  No evidence of joint effusion.  Patient had 1 out of 2 positive blood cultures for group C streptococcus.  Repeat blood cultures on 2/24 have not shown any growth.  2D echocardiogram did not show any evidence of vegetations.  Since patient has bioprosthetic aortic valve, TEE performed on 2/27 did not show any evidence of vegetations.  PICC line has been placed and he will complete a 2-week course of Ancef. 3. Acute CVA.  Patient having difficulty moving his right arm and right leg.  He reported falling prior to admission and it was initially thought that his limited range of motion was related to pain from fall.  X-rays of his shoulder and his right hip were unrevealing.   He reports that he has had difficulty moving his right arm and right leg since admission.  Since his right-sided weakness has persisted, MRI of the brain was obtained today that showed bilateral infarcts.  Discussed with Dr. Wilford Corner at [facility] who agrees with transfer for further evaluation.  Patient will need a CT head to further evaluate possible blood products on MRI brain, but Dr. Wilford Corner feels it is okay to give the patient an aspirin for now.  We will start the patient on neurochecks.  He will need imaging of his neck once he is arrived to [facility].  He is already been seen by physical therapy with recommendations for skilled nursing facility placement. 4. Acute renal failure- creatinine is currently stable.  Continue to monitor urine output. 5. Cellulitis of RLE - due to streptococcus infection, clinically looking better, [place] RLE negative for DVT.  6. Chronic atrial fibrillation- He is on rate control with Cardizem.  He has been chronically taking Eliquis.  This was held on admission due to severe thrombocytopenia.  He will need to be restarted on anticoagulation, but there is concern for blood products on MRI brain.  He will need CT of head to further clarify presence of blood products prior to initiation of anticoagulation. 7. Thrombocytopenia- patient was initially severely thrombocytopenic, felt secondary to sepsis.  Since being treated with antibiotics and sepsis improving, overall platelet count has been improving. 8. Leukocytosis - marked bump in WBC - clinically patient is improving.  WBC count trending down with IV antibiotics.  9. Status post TAVR for severe aortic stenosis-he is followed by Dr. Allyson SabalBerry.  He had been doing fairly well since surgery.   10. Acute on chronic right shoulder pain- plain film x-rays only showed arthritic changes in the right shoulder.  Joint does not appear to be inflamed, tender or swollen. 11. Essential hypertension- blood pressures currently stable and controlled.   Follow. 12. Hyperbilirubinemia.  Trending down with IV fluids since admission.  Right upper quadrant ultrasound is unremarkable. 13. Right arm swelling.  Venous Dopplers negative for DVT  DVT prophylaxis: SCDs Code Status: Full Family Communication: Patient's wife and sisters updated at the bedside Dis[facility] Plan: [facility].  Consultants:  Cardiology for TEE  Procedures: Echo: 1. The left ventricle has normal systolic function with an ejection fraction of 60-65%. The cavity size was normal. There is mildly increased left ventricular wall thickness. Left ventricular diastolic Doppler parameters are indeterminate. No evidence  of left ventricular regional wall motion abnormalities.  2. The right ventricle has moderately reduced systolic function. The cavity was mildly enlarged. There is no increase in right ventricular wall thickness. Right ventricular systolic pressure is moderately elevated with an estimated pressure of 41.7  mmHg.  3. Left atrial size was severely dilated.  4. Right atrial size was severely dilated.  5. The mitral valve is normal in structure. There is mild mitral annular calcification present.  6. The tricuspid valve is normal in structure.  7. A 26 Edwards Sapien bioprosthetic aortic valve (TAVR) valve is present in the aortic position. Normal function observed with mean gradient 15.7 mmHg. No perivalvular leak.  8. The pulmonic valve was grossly normal. Pulmonic valve regurgitation is mild by color flow Doppler.  9. The aortic root is normal in size and structure. 10. The inferior vena cava was dilated in size with <50% respiratory variability.  11. No evidence of left ventricular regional wall motion abnormalities.  Antimicrobials:  Cefepime 2/22-2/24  Vancomycin 2/22 >> 2/25  Metronidazole 2/22 >2/13  Ancef 2/25 >  Subjective: Overall he is feeling better.  Still has difficulty moving his right arm and  his right leg.  Objective: Vitals:   01/18/19 0930 01/18/19 0940 01/18/19 2104 01/19/19 0554  BP: 130/74 114/66 138/85 117/76  Pulse: 80 79 73 69  Resp: 19 20 20 16   Temp:   98.4 F (36.9 C) 98.1 F (36.7 C)  TempSrc:   Oral Oral  SpO2: 97% 95% 98% 98%  Weight:      Height:        Intake/Output Summary (Last 24 hours) at 01/19/2019 1759 Last data filed at 01/18/2019 2306 Gross per 24 hour  Intake -  Output 550 ml  Net -550 ml   Filed Weights   01/13/19 1356  Weight: 110.7 kg   REVIEW OF SYSTEMS  As per history otherwise all reviewed and reported negative  Exam:  General exam: Alert, awake, oriented x 3 Respiratory system: Clear to auscultation. Respiratory effort normal. Cardiovascular system:RRR. No murmurs, rubs, gallops. Gastrointestinal system: Abdomen is nondistended, soft and nontender. No organomegaly or masses felt. Normal bowel sounds heard. Central nervous system: Alert and oriented.  2-3 out of 5 strength in the right upper and lower extremities.  5 out of 5 strength in the left upper and lower extremities.  Cranial nerves are grossly intact.  Gait was not checked. Extremities: Right upper extremity is more swollen than the left, right lower extremity has more swelling than the left Skin:  As pictured below.  Overall erythema in right lower extremity is substantially improved Psychiatry: Judgement and insight appear normal. Mood & affect appropriate.     Basic Metabolic Panel: Recent Labs  Lab 01/13/19 1947  01/15/19 0415 01/16/19 0427 01/17/19 0418 01/18/19 0424 01/19/19 0526  NA  --    < > 139 139 138 136 135  K  --    < > 4.2 4.7 4.9 5.1 5.0  CL  --    < > 112* 112* 112* 112* 110  CO2  --    < > 19* 19* 19* 18* 19*  GLUCOSE  --    < > 99 92 100* 95 106*  BUN  --    < > 46* 53* 51* 51* 51*  CREATININE  --    < > 1.40* 1.50* 1.45* 1.68* 1.60*  CALCIUM  --    < > 8.1* 8.2* 8.2* 8.1* 8.0*  MG 1.9  --  2.8* 3.0*  --   --   --    < > = values in  this interval not displayed.   Liver Function Tests: Recent Labs  Lab 01/15/19 0415 01/16/19 0427 01/17/19 0418 01/18/19 0424 01/19/19 0526  AST 43* 38 30 26 26   ALT 29 28 24 15 7   ALKPHOS 90 96 105 105 102  BILITOT 2.0* 2.3* 1.6* 1.4* 1.3*  PROT 5.6* 6.2* 6.0* 6.1* 6.3*  ALBUMIN 2.3* 2.4* 2.3* 2.1* 2.0*   No results for input(s): LIPASE, AMYLASE in the last 168 hours. No results for input(s): AMMONIA in the last 168 hours. CBC: Recent Labs  Lab 01/15/19 0415 01/16/19 0427 01/17/19 0418 01/18/19 0424 01/19/19 0526  WBC 31.0* 26.2* 24.2* 20.9* 17.3*  NEUTROABS 27.1* 22.4* 19.4* 15.4* 12.5*  HGB 11.6* 11.5* 11.0* 10.4* 10.0*  HCT 34.0* 33.8* 31.7* 30.4* 29.8*  MCV 70.0* 68.6* 69.7* 68.0* 70.3*  PLT 62* 78* 105* 153 224   Cardiac Enzymes: No results for input(s): CKTOTAL, CKMB, CKMBINDEX, TROPONINI in the last 168 hours. CBG (last 3)  No results for input(s): GLUCAP in the last 72 hours. Recent Results (from the past 240 hour(s))  Culture, blood (routine x 2)     Status: None   Collection Time: 01/13/19  3:51 PM  Result Value Ref Range Status   Specimen Description BLOOD LEFT ARM  Final   Special Requests   Final    BOTTLES DRAWN AEROBIC ONLY Blood Culture adequate volume   Culture   Final    NO GROWTH 5 DAYS Performed at [facility], [address] [place], [name] [address]    Report Status 01/18/2019 FINAL  Final  Urine culture     Status: Abnormal   Collection Time: 01/13/19  5:40 PM  Result Value Ref Range Status   Specimen Description   Final    URINE, CATHETERIZED Performed at [facility], [address], [place], [name] [address]    Special Requests   Final    NONE Performed at [facility], [address], [place], [name] [address]    Culture (A)  Final    <10,000 COLONIES/mL INSIGNIFICANT GROWTH Performed at [facility], [address] [place], [name] [address]    Report Status 01/15/2019 FINAL  Final  Culture, blood (routine x 2)      Status: Abnormal   Collection Time: 01/13/19  7:47 PM  Result Value Ref Range Status   Specimen Description   Final    BLOOD LEFT ARM Performed at  [facility], [address], [place], [name] [address]    Special Requests   Final    BOTTLES DRAWN AEROBIC AND ANAEROBIC Blood Culture adequate volume Performed at [facility], [address] [place], [name] [address]    Culture  Setup Time   Final    GRAM POSITIVE COCCI IN CHAINS Gram Stain Report Called to,Read Back By and Verified With: BOUDERANT,R @ 0516 ON 01/14/19 BY JUW GS DONE @ [facility] CRITICAL RESULT CALLED TO, READ BACK BY AND VERIFIED WITH: S. HALL, PHARMD ([facility]) AT 1010 ON 01/14/19 BY C. JESSUP, MLT. Performed at [facility], [address], [place], [name] [address]    Culture STREPTOCOCCUS GROUP C (A)  Final   Report Status 01/16/2019 FINAL  Final   Organism ID, Bacteria STREPTOCOCCUS GROUP C  Final      Susceptibility   Streptococcus group c - MIC*    CLINDAMYCIN <=0.25 SENSITIVE Sensitive     AMPICILLIN <=0.25 SENSITIVE Sensitive     ERYTHROMYCIN <=0.12 SENSITIVE Sensitive     VANCOMYCIN <=0.12 SENSITIVE Sensitive     CEFTRIAXONE <=0.12 SENSITIVE Sensitive     LEVOFLOXACIN 0.5 SENSITIVE Sensitive     PENICILLIN Value in next row Sensitive      SENSITIVE<=0.06    * STREPTOCOCCUS GROUP C  Blood Culture ID Panel (Reflexed)     Status: Abnormal   Collection Time: 01/13/19  7:47 PM  Result Value Ref Range Status   Enterococcus species NOT DETECTED NOT DETECTED Final   Listeria monocytogenes NOT DETECTED NOT DETECTED Final   Staphylococcus species NOT DETECTED NOT DETECTED Final   Staphylococcus aureus (BCID) NOT DETECTED NOT DETECTED Final   Streptococcus species DETECTED (A) NOT DETECTED Final    Comment: Not Enterococcus species, Streptococcus agalactiae, Streptococcus pyogenes, or Streptococcus pneumoniae. CRITICAL RESULT CALLED TO, READ BACK BY AND VERIFIED WITH: S. HALL, PHARMD ([facility]) AT 1010 ON 01/14/19 BY C.  JESSUP, MLT.    Streptococcus agalactiae NOT DETECTED NOT DETECTED Final   Streptococcus pneumoniae NOT DETECTED NOT DETECTED Final   Streptococcus pyogenes NOT DETECTED NOT DETECTED Final   Acinetobacter baumannii NOT DETECTED NOT DETECTED Final   Enterobacteriaceae species NOT DETECTED NOT DETECTED Final   Enterobacter cloacae complex NOT DETECTED NOT DETECTED Final   Escherichia coli NOT DETECTED NOT DETECTED Final   Klebsiella oxytoca NOT DETECTED NOT DETECTED Final   Klebsiella pneumoniae NOT DETECTED NOT DETECTED Final   Proteus species NOT DETECTED NOT DETECTED Final   Serratia marcescens NOT DETECTED NOT DETECTED Final   Haemophilus influenzae NOT DETECTED NOT DETECTED Final   Neisseria meningitidis NOT DETECTED NOT DETECTED Final   Pseudomonas aeruginosa NOT DETECTED NOT DETECTED Final   Candida albicans NOT DETECTED NOT DETECTED Final   Candida glabrata NOT DETECTED NOT DETECTED Final   Candida krusei NOT DETECTED NOT DETECTED Final   Candida parapsilosis NOT DETECTED NOT DETECTED Final   Candida tropicalis NOT DETECTED NOT DETECTED Final    Comment: Performed at [facility], [address], [place], [name] [address]  Culture, blood (Routine X 2) w Reflex to ID Panel     Status: None (Preliminary result)   Collection Time: 01/15/19  8:52 AM  Result Value Ref Range Status   Specimen Description BLOOD LEFT FOREARM  Final   Special Requests   Final    BOTTLES DRAWN AEROBIC AND ANAEROBIC Blood Culture adequate volume   Culture   Final    NO GROWTH 4 DAYS Performed at  [facility], [address], [place], [name] [address]    Report Status PENDING  Incomplete  Culture, blood (Routine X 2) w Reflex to ID Panel     Status: None (Preliminary result)   Collection Time: 01/15/19  9:05 AM  Result Value Ref Range Status   Specimen Description BLOOD LEFT HAND  Final   Special Requests   Final    BOTTLES DRAWN AEROBIC AND ANAEROBIC Blood Culture results may not be optimal  due to an excessive volume of blood received in culture bottles   Culture   Final    NO GROWTH 4 DAYS Performed at [facility], [address] [place], [name] [address]    Report Status PENDING  Incomplete     Studies: Mr Shirlee Latch UJ Contrast  Result Date: 01/19/2019 CLINICAL DATA:  Right upper extremity weakness EXAM: MRI HEAD WITHOUT CONTRAST MRA HEAD WITHOUT CONTRAST TECHNIQUE: Multiplanar, multiecho pulse sequences of the brain and surrounding structures were obtained without intravenous contrast. Angiographic images of the head were obtained using MRA technique without contrast. COMPARISON:  Head CT 10/26/2016 FINDINGS: MRI HEAD FINDINGS BRAIN: There is scattered multifocal diffusion restriction throughout the brain. The largest areas of diffusion abnormality are in the cerebellum. There are bilateral areas of ischemia in the deep watershed distribution. There are multiple bilateral peripheral subcortical foci of ischemia. Areas along the left postcentral gyrus and right anterior convexity are suggestive subarachnoid hemorrhage. There is also magnetic susceptibility effect in the left cerebellum, also likely indicating an area of hemorrhage. There is mild edema corresponding to the sites of diffusion abnormality. Mild generalized volume loss. No midline shift. SKULL AND UPPER CERVICAL SPINE: The visualized skull base, calvarium, upper cervical spine and extracranial soft tissues are normal. SINUSES/ORBITS: No fluid levels or advanced mucosal thickening. No mastoid or middle ear effusion. The orbits are normal. MRA HEAD FINDINGS POSTERIOR CIRCULATION: --Basilar artery: Normal. --Posterior cerebral arteries: Normal. The right PCA is partially supplied by the posterior communicating artery. --Superior cerebellar arteries: Normal. --Inferior cerebellar arteries: Normal left AICA. The posterior inferior cerebral arteries are normal. ANTERIOR CIRCULATION: --Intracranial internal carotid arteries: Normal.  --Anterior cerebral arteries: Normal. Both A1 segments are present. Patent anterior communicating artery. --Middle cerebral arteries: Moderate narrowing of the M2 segments bilaterally. --Posterior communicating arteries: Present on the right, absent on the left. IMPRESSION: 1. Numerous scattered foci of acute ischemia throughout the cerebrum and cerebellum. Many of these lesions are most suggestive of embolic infarcts, given the widespread distribution over multiple vascular territories. Others are in a deep watershed distribution, as is seen in the setting of hypoperfusion events. 2. Areas of magnetic susceptibility effect superimposed on diffusion abnormality along the left postcentral gyrus, right frontal convexity and lateral ventricular atria are suggestive subarachnoid blood. Noncontrast head CT recommended for clarification. 3. No midline shift or other mass effect. 4. No proximal intracranial arterial occlusion. Moderate narrowing of the M2 segments of both middle cerebral arteries. Critical Value/emergent results were called by telephone at the time of interpretation on 01/19/2019 at 5:32 pm to Dr. Durward Mallard [facility] , who verbally acknowledged these results. Electronically Signed   By: Deatra Robinson M.D.   On: 01/19/2019 17:33   Mr Brain Wo Contrast  Result Date: 01/19/2019 CLINICAL DATA:  Right upper extremity weakness EXAM: MRI HEAD WITHOUT CONTRAST MRA HEAD WITHOUT CONTRAST TECHNIQUE: Multiplanar, multiecho pulse sequences of the brain and surrounding structures were obtained without intravenous contrast. Angiographic images of the head were obtained using MRA technique without contrast. COMPARISON:  Head CT 10/26/2016 FINDINGS: MRI HEAD FINDINGS BRAIN: There is scattered multifocal diffusion restriction throughout the brain. The largest areas of diffusion abnormality are in the cerebellum. There are bilateral areas of ischemia in the deep watershed distribution. There are multiple bilateral peripheral  subcortical foci of ischemia. Areas along the left postcentral gyrus and right anterior convexity are suggestive subarachnoid hemorrhage. There is also magnetic susceptibility effect in the left cerebellum, also likely indicating an area of hemorrhage. There is mild edema corresponding to the sites of diffusion abnormality. Mild generalized volume loss. No midline shift. SKULL AND UPPER CERVICAL SPINE: The visualized skull base, calvarium, upper cervical spine and extracranial soft tissues are normal. SINUSES/ORBITS: No fluid levels or advanced mucosal thickening. No mastoid or middle ear effusion. The orbits are normal. MRA HEAD FINDINGS POSTERIOR CIRCULATION: --Basilar artery: Normal. --Posterior cerebral arteries: Normal. The right PCA is partially supplied by the posterior communicating artery. --Superior cerebellar arteries: Normal. --Inferior cerebellar arteries: Normal left AICA. The posterior inferior cerebral arteries are normal. ANTERIOR CIRCULATION: --Intracranial internal carotid arteries: Normal. --Anterior cerebral arteries: Normal. Both A1 segments are present. Patent anterior communicating artery. --Middle cerebral arteries: Moderate narrowing of the M2 segments bilaterally. --Posterior communicating arteries: Present on the right, absent on the left. IMPRESSION: 1. Numerous scattered foci of acute ischemia throughout the cerebrum and cerebellum. Many of these lesions are most suggestive of embolic infarcts, given the widespread distribution over multiple vascular territories. Others are in a deep watershed distribution, as is seen in the setting of hypoperfusion events. 2. Areas of magnetic susceptibility effect superimposed on diffusion abnormality along the left postcentral gyrus, right frontal convexity and lateral ventricular atria are suggestive subarachnoid blood. Noncontrast head CT recommended for clarification. 3. No midline shift or other mass effect. 4. No proximal intracranial arterial  occlusion. Moderate narrowing of the M2 segments of both middle cerebral arteries. Critical Value/emergent results were called by telephone at the time of interpretation on 01/19/2019 at 5:32 pm to Dr. Durward Mallard [facility] , who verbally acknowledged these results. Electronically Signed   By: Deatra Robinson M.D.   On: 01/19/2019 17:33   Mr Cervical Spine Wo Contrast  Result Date: 01/19/2019 CLINICAL DATA:  Shoulder pain and progressive weakness EXAM: MRI CERVICAL SPINE WITHOUT CONTRAST TECHNIQUE: Multiplanar, multisequence MR imaging of the cervical spine was performed. No intravenous contrast was administered. COMPARISON:  None. FINDINGS: Alignment: Normal. Vertebrae: No focal marrow lesion. No compression fracture or evidence of discitis osteomyelitis. Cord: Normal caliber and signal. Posterior Fossa, vertebral arteries, paraspinal tissues: Visualized posterior fossa is normal. Vertebral artery flow voids are preserved. No prevertebral effusion. Disc levels: Sagittal imaging includes the atlantoaxial joint to the level of the T2-3 disc space, with axial imaging of the disc spaces from C2-3 to C6-7. Axial images are degraded by motion. Within that limitation, there is no spinal canal stenosis above the C5 level. At C5-6, there is a small central disc protrusion that narrows the ventral thecal sac with mild spinal canal stenosis. At C6-7, there is a intermediate disc bulge causing mild spinal canal stenosis. There is poor visualization of the neural foramina because of motion. IMPRESSION: 1. Motion degraded examination, particularly limiting assessment of the neural foramina. 2. Mild spinal canal stenosis at C5-6 and C6-7. 3. No acute abnormality of the cervical spine. Electronically Signed   By: Deatra Robinson M.D.   On: 01/19/2019 17:45   US Venous Img Upper Uni Right  Result Date: 01/18/2019 CLINICAL DATA:  Right upper extremity edema. History of  diabetes. Evaluate DVT. EXAM: RIGHT UPPER EXTREMITY VENOUS DOPPLER  ULTRASOUND TECHNIQUE: Gray-scale sonography with graded compression, as well as color Doppler and duplex ultrasound were performed to evaluate the upper extremity deep venous system from the level of the subclavian vein and including the jugular, axillary, basilic, radial, ulnar and upper cephalic vein. Spectral Doppler was utilized to evaluate flow at rest and with distal augmentation maneuvers. COMPARISON:  None. FINDINGS: Contralateral Subclavian Vein: Respiratory phasicity is normal and symmetric with the symptomatic side. No evidence of thrombus. Normal compressibility. Internal Jugular Vein: No evidence of thrombus. Normal compressibility, respiratory phasicity and response to augmentation. Subclavian Vein: No evidence of thrombus. Normal compressibility, respiratory phasicity and response to augmentation. Axillary Vein: No evidence of thrombus. Normal compressibility, respiratory phasicity and response to augmentation. Cephalic Vein: No evidence of thrombus. Normal compressibility, respiratory phasicity and response to augmentation. Basilic Vein: No evidence of thrombus. Normal compressibility, respiratory phasicity and response to augmentation. Brachial Veins: No evidence of thrombus. Normal compressibility, respiratory phasicity and response to augmentation. Radial Veins: No evidence of thrombus. Normal compressibility, respiratory phasicity and response to augmentation. Ulnar Veins: No evidence of thrombus. Normal compressibility, respiratory phasicity and response to augmentation. Venous Reflux:  None visualized. Other Findings:  None visualized. IMPRESSION: No evidence of DVT within the right upper extremity. Electronically Signed   By: Simonne Come M.D.   On: 01/18/2019 15:10   [place] Ekg Site Rite  Result Date: 01/18/2019 If [facility] image not attached, placement could not be confirmed due to current cardiac rhythm.  US Abdomen Limited Ruq  Result Date: 01/18/2019 CLINICAL DATA:  Increased liver  function tests. EXAM: ULTRASOUND ABDOMEN LIMITED RIGHT UPPER QUADRANT COMPARISON:  CT abdomen pelvis July 11, 2018 FINDINGS: Gallbladder: No gallstones or wall thickening visualized. The gallbladder is distended. No sonographic Murphy sign noted by sonographer. Common bile duct: Diameter: 5 mm Liver: No focal lesion identified. Within normal limits in parenchymal echogenicity. Nodular contour of the liver is noted. Portal vein is patent on color Doppler imaging with normal direction of blood flow towards the liver. IMPRESSION: No evidence of acute cholecystitis. Nodular contour of liver, this can be seen in cirrhosis of liver. Electronically Signed   By: Sherian Rein M.D.   On: 01/18/2019 12:22   Scheduled Meds: . aspirin  325 mg Oral Daily  . atorvastatin  80 mg Oral q1800  . diltiazem  120 mg Oral Daily  . sodium chloride flush  10-40 mL Intracatheter Q12H  . spironolactone  12.5 mg Oral Daily   Continuous Infusions: .  ceFAZolin (ANCEF) IV 2 g (01/19/19 1448)   Active Problems:   Obstructive sleep apnea   Chronic atrial fibrillation   HTN (hypertension)   Hyperlipemia   Status post gastric bypass for obesity   Vitamin D deficiency   History of Severe aortic stenosis   Varicose veins of right lower extremity with complications   Pulmonary hypertension, unspecified (HCC)   History of subdural hematoma   S/P TAVR (transcatheter aortic valve replacement)   Sepsis (HCC)   Acute renal failure (HCC)   Right shoulder pain  Time spent:  Erick Blinks, MD [facility] 01/19/2019, 5:59 PM    LOS: 6 days

## 2019-01-19 NOTE — Progress Notes (Signed)
PHARMACY CONSULT NOTE FOR:  OUTPATIENT  PARENTERAL ANTIBIOTIC THERAPY (OPAT)  Indication: bacteremia Regimen: cefazolin 2g IV q8h End date:  01/29/2019  IV antibiotic discharge orders are pended. To discharging provider:  please sign these orders via discharge navigator,  Select New Orders & click on the button choice - Manage This Unsigned Work.     Thank you for allowing pharmacy to be a part of this patient's care.  Tama High 01/19/2019, 5:08 PM

## 2019-01-19 NOTE — Clinical Social Work Placement (Signed)
   CLINICAL SOCIAL WORK PLACEMENT  NOTE  Date:  01/19/2019  Patient Details  Name: Joel Rogers MRN: [id number] Date of Birth: Mar 30, 1951  Clinical Social Work is seeking post-discharge placement for this patient at the Skilled  Nursing Facility level of care (*CSW will initial, date and re-position this form in  chart as items are completed):  Yes   Patient/family provided with [facility] Clinical Social Work Department's list of facilities offering this level of care within the geographic area requested by the patient (or if unable, by the patient's family).  Yes   Patient/family informed of their freedom to choose among providers that offer the needed level of care, that participate in Medicare, Medicaid or managed care program needed by the patient, have an available bed and are willing to accept the patient.  Yes   Patient/family informed of [facility]'s ownership interest in [facility] and [facility], as well as of the fact that they are under no obligation to receive care at these facilities.  PASRR submitted to EDS on 01/15/19     PASRR number received on 01/15/19     Existing PASRR number confirmed on       FL2 transmitted to all facilities in geographic area requested by pt/family on 01/15/19     FL2 transmitted to all facilities within larger geographic area on       Patient informed that his/her managed care company has contracts with or will negotiate with certain facilities, including the following:        Yes   Patient/family informed of bed offers received.  Patient chooses bed at [facility]     Physician recommends and patient chooses bed at      Patient to be transferred to [facility] on 01/20/19.  Patient to be transferred to facility by tunnel     Patient family notified on 01/19/19 of transfer.  Name of family member notified:  wife, sister     PHYSICIAN       Additional Comment: [facility] alerted that pt will  likely transfer tomorrow.  Pt and family notified as well.  CSW sign off.   _______________________________________________ Ida Rogue, LCSW 01/19/2019, 3:21 PM

## 2019-01-19 NOTE — Progress Notes (Signed)
Patient to transfer to [facility]. Positive for CVA per MD, stroke swallow screen completed this evening prior to administering aspirin and lipitor as ordered. Patient passed swallow screen. Family at bedside, wife requested to be notified when patient is leaving unit. NIH score and neuro assessment completed, NIH score 4. MD aware. Vitals stable, see flowsheet. Report given to [facility]. ETA 20 minutes. Night shift RN aware. Earnstine Regal, RN

## 2019-01-20 ENCOUNTER — Inpatient Hospital Stay (HOSPITAL_COMMUNITY): Payer: Medicare Other

## 2019-01-20 DIAGNOSIS — I639 Cerebral infarction, unspecified: Secondary | ICD-10-CM

## 2019-01-20 DIAGNOSIS — I6300 Cerebral infarction due to thrombosis of unspecified precerebral artery: Secondary | ICD-10-CM

## 2019-01-20 LAB — CULTURE, BLOOD (ROUTINE X 2)
Culture: NO GROWTH
Culture: NO GROWTH
Special Requests: ADEQUATE

## 2019-01-20 MED ORDER — ASPIRIN 325 MG PO TABS: 325.0000 mg | ORAL_TABLET | Freq: Every day | ORAL | Status: DC

## 2019-01-20 MED ORDER — APIXABAN 5 MG PO TABS
5.0000 mg | ORAL_TABLET | Freq: Two times a day (BID) | ORAL | Status: DC
  Administered 2019-01-20: 5 mg via ORAL
  Filled 2019-01-20: qty 1

## 2019-01-20 NOTE — Progress Notes (Signed)
ANTICOAGULATION CONSULT NOTE - Initial Consult  Pharmacy Consult for apixaban Indication: atrial fibrillation in setting of stroke  No Known Allergies  Patient Measurements: Height: 6\' 1"  (185.4 cm) Weight: 282 lb 6.6 oz (128.1 kg) IBW/kg (Calculated) : 79.9   Vital Signs: Temp: 99.1 F (37.3 C) (02/29 0423) Temp Source: Oral (02/29 0423) BP: 114/74 (02/29 0423) Pulse Rate: 63 (02/29 0600)  Labs: Recent Labs    01/18/19 0424 01/19/19 0526  HGB 10.4* 10.0*  HCT 30.4* 29.8*  PLT 153 224  CREATININE 1.68* 1.60*    Estimated Creatinine Clearance: 62 mL/min (A) (by C-G formula based on SCr of 1.6 mg/dL (H)).   Medical History: Past Medical History:  Diagnosis Date  . Anemia    low iron  . Arthritis    bilateral knees  . Atrial fibrillation, chronic   . Chronic diastolic CHF (congestive heart failure) (HCC) 06/15/2018  . History of subdural hematoma   . Hyperlipidemia   . Hypertension   . Morbid obesity (HCC)   . Normal coronary arteries    by cardiac catheterization performed 03/14/06  . Obesity   . Pre-diabetes    pt denies  . S/P TAVR (transcatheter aortic valve replacement)    a. 07/25/18: Edwards Sapien 3 THV (size 26 mm, model # 9600TFX, serial # [id number]) by Dr. Laneta Simmers and Dr. Clifton James  . Sleep apnea    on CPAP  . Venous insufficiency    Assessment: 68 yo male with h/o afib on apixaban PTA. Patient admitted with new stroke. Per Neurololgy, safe to restart apixaban. Patient <80 yo, >60kg, Scr >1.5. Patient qualifies for full dose apixaban.   Goal of Therapy:  Monitor platelets by anticoagulation protocol: Yes   Plan:  Apixaban 5mg  PO BID Monitor for bleeding.   Lysha Schrade A. Jeanella Craze, PharmD, BCPS Clinical Pharmacist [place] Please utilize Amion for appropriate phone number to reach the unit pharmacist [facility])   01/20/2019,1:55 PM

## 2019-01-20 NOTE — Consult Note (Signed)
Referring Physician: Dr. Kerry Hough    Chief Complaint: Multifocal strokes.   HPI: Joel Rogers is an 68 y.o. male with a history of atrial fibrillation on anticoagulation, CHF, history of subdural hematoma, HLD, HTN, TAVR placement in 9/19 and sleep apnea who is being seen in consultation for multifocal infarcts seen on MRI brain. He initially presented to the ED with right shoulder pain, fever, tachycardia and cellulitis of his RLE. He was diagnosed with severe sepsis, which was treated - his blood cultures were positive for group C strep. Given that he had a bioprosthetic valve he was at risk for vegetations - TTE showed no evidence of vegetations. ID was consulted, who recommended a TEE. TEE showed an EF of 60-65% with normal mitral and tricuspid valves, TAVR present in the expected location and mild plaque in the descending aorta - but again, no valvular vegetations were identified. He has been continued on a 2 week course of Ancef. Eliquis was temporarily held due to thrombocytopenia - the latter felt to be secondary to severe sepsis; of note, his platelets have been steadily improving and have normalized to 224 as of yesterday AM.   Due to persistent right sided weakness during this admission, he underwent an MRI brain that showed multiple bilateral acute ischemic infarctions, supratentorially and infratentorially, appearing most consistent with a cardioembolic etiology. Two of the infarcts show hemorrhagic transformation on MRI.   MRI brain/MRA head: 1. Numerous scattered foci of acute ischemia throughout the cerebrum and cerebellum. Many of these lesions are most suggestive of embolic infarcts, given the widespread distribution over multiple vascular territories. Others are in a deep watershed distribution, as is seen in the setting of hypoperfusion events. 2. Areas of magnetic susceptibility effect superimposed on diffusion abnormality along the left postcentral gyrus, right frontal convexity  and lateral ventricular atria are suggestive subarachnoid blood. Noncontrast head CT recommended for clarification. 3. No midline shift or other mass effect. 4. No proximal intracranial arterial occlusion. Moderate narrowing of the M2 segments of both middle cerebral arteries.   Past Medical History:  Diagnosis Date  . Anemia    low iron  . Arthritis    bilateral knees  . Atrial fibrillation, chronic   . Chronic diastolic CHF (congestive heart failure) (HCC) 06/15/2018  . History of subdural hematoma   . Hyperlipidemia   . Hypertension   . Morbid obesity (HCC)   . Normal coronary arteries    by cardiac catheterization performed 03/14/06  . Obesity   . Pre-diabetes    pt denies  . S/P TAVR (transcatheter aortic valve replacement)    a. 07/25/18: Edwards Sapien 3 THV (size 26 mm, model # 9600TFX, serial # [id number]) by Dr. Laneta Simmers and Dr. Clifton James  . Sleep apnea    on CPAP  . Venous insufficiency     Past Surgical History:  Procedure Laterality Date  . CRANIOTOMY Right 08/27/2016   Procedure: CRANIOTOMY HEMATOMA EVACUATION SUBDURAL;  Surgeon: Maeola Harman, MD;  Location: [facility] OR;  Service: Neurosurgery;  Laterality: Right;  . GASTRIC BYPASS  09/2008  . JOINT REPLACEMENT  08/31/2006   right hip  . MULTIPLE EXTRACTIONS WITH ALVEOLOPLASTY N/A 07/13/2018   Procedure: Extraction of tooth #'s 3,8,10, 23-26, 30and 32 with alveoloplasty and gross debridement of remaining teeth;  Surgeon: Charlynne Pander, DDS;  Location: [facility] OR;  Service: Oral Surgery;  Laterality: N/A;  . RIGHT HEART CATH N/A 07/06/2018   Procedure: RIGHT HEART CATH;  Surgeon: Kathleene Hazel, MD;  Location: [facility]  INVASIVE CV LAB;  Service: Cardiovascular;  Laterality: N/A;  . RIGHT/LEFT HEART CATH AND CORONARY ANGIOGRAPHY N/A 07/10/2018   Procedure: RIGHT/LEFT HEART CATH AND CORONARY ANGIOGRAPHY;  Surgeon: Dolores Patty, MD;  Location: [facility] INVASIVE CV LAB;  Service: Cardiovascular;  Laterality: N/A;  . TEE WITHOUT  CARDIOVERSION N/A 07/25/2018   Procedure: TRANSESOPHAGEAL ECHOCARDIOGRAM (TEE);  Surgeon: Kathleene Hazel, MD;  Location: [facility] OR;  Service: Open Heart Surgery;  Laterality: N/A;  . TEE WITHOUT CARDIOVERSION N/A 01/18/2019   Procedure: TRANSESOPHAGEAL ECHOCARDIOGRAM (TEE) WITH PROPOFOL;  Surgeon: Jonelle Sidle, MD;  Location: [facility] ORS;  Service: Cardiovascular;  Laterality: N/A;  . TOTAL HIP ARTHROPLASTY     right hip  . TRANSCATHETER AORTIC VALVE REPLACEMENT, TRANSFEMORAL  07/25/2018  . TRANSCATHETER AORTIC VALVE REPLACEMENT, TRANSFEMORAL N/A 07/25/2018   Procedure: TRANSCATHETER AORTIC VALVE REPLACEMENT, TRANSFEMORAL;  Surgeon: Kathleene Hazel, MD;  Location: [facility] OR;  Service: Open Heart Surgery;  Laterality: N/A;    Family History  Problem Relation Age of Onset  . Hypertension Father   . Cancer Father        prob prostate  . Alzheimer's disease Mother   . Alzheimer's disease Maternal Grandfather   . Breast cancer Sister   . Cancer Brother        "brain tumor"   Social History:  reports that he has quit smoking. He has never used smokeless tobacco. He reports that he does not drink alcohol or use drugs.  Allergies: No Known Allergies  Medications:  Prior to Admission:  Medications Prior to Admission  Medication Sig Dispense Refill Last Dose  . acetaminophen (TYLENOL) 650 MG CR tablet Take 650 mg by mouth 2 (two) times daily as needed for pain.   01/12/2019  . amoxicillin (AMOXIL) 500 MG capsule Take 2,000 mg (4 tablets) one hour prior to dental visits. 8 capsule 3 Past Month at Unknown time  . apixaban (ELIQUIS) 5 MG TABS tablet Take 1 tablet (5 mg total) by mouth 2 (two) times daily. 180 tablet 1 01/12/2019 at 0900  . aspirin 81 MG chewable tablet Chew 1 tablet (81 mg total) by mouth daily.   01/12/2019  . atorvastatin (LIPITOR) 80 MG tablet TAKE 1 TABLET BY MOUTH ONCE DAILY WITH  BREAKFAST 90 tablet 1 01/12/2019  . colchicine 0.6 MG tablet TAKE 1 TABLET BY MOUTH TWICE  DAILY 60 tablet 2 Past Month at Unknown time  . diltiazem (CARDIZEM CD) 120 MG 24 hr capsule Take 1 capsule (120 mg total) by mouth daily. 90 capsule 1 01/12/2019  . ergocalciferol (VITAMIN D2) 1.25 MG (50000 UT) capsule Take 50,000 Units by mouth once a week.   01/12/2019  . spironolactone (ALDACTONE) 25 MG tablet Take 0.5 tablets (12.5 mg total) by mouth daily. 30 tablet 5 01/12/2019  . torsemide (DEMADEX) 20 MG tablet Take 40 mg by mouth as needed. Use as needed if weight is over 245 pounds.   01/12/2019   Scheduled: .  stroke: mapping our early stages of recovery book   Does not apply Once  . aspirin  325 mg Oral Daily  . atorvastatin  80 mg Oral q1800  . diltiazem  120 mg Oral Daily  . sodium chloride flush  10-40 mL Intracatheter Q12H  . spironolactone  12.5 mg Oral Daily   Continuous: .  ceFAZolin (ANCEF) IV 2 g (01/19/19 2357)    ROS: The patient is a poor/unreliable historian. He initially denies any weakness, but when right sided weakness is demonstrated on  neurological exam he then states that he has trouble moving these limbs. Also denies limb pain, but during exam he states RLE movement is painful. In this context he denies headache, vision loss, speech changes, CP, SOB, abdominal pain and sensory loss. Other ROS as per HPI.   Physical Examination: Blood pressure 122/77, pulse 72, temperature 98.9 F (37.2 C), temperature source Oral, resp. rate 20, height  (1.854 m), weight 123.3 kg, SpO2 99 %.  HEENT: [place]/AT Lungs: Respirations unlabored Ext: Edema and pigmentary changes to distal BLE  Neurologic Examination: Mental Status: Awake and alert, oriented x 5. Pleasant and cooperative. Speech fluent with intact comprehension of basic commands and questions, but does not correctly follow a directional 3 step command. Repetition intact. Able to name thumb, ink pen and badge, but not pinky finger.  Cranial Nerves: II:  Visual fields intact with no extinction to DSS.   III,IV,  VI: No ptosis. EOMI without nystagmus.  V,VII: Smile symmetric, facial temp sensation equal bilaterally VIII: hearing intact to voice IX,X: Palate rises symmetrically XI: Symmetric XII: midline tongue extension Motor: RUE 3/5 proximal and distal with decreased tone RLE: Refuses to flex at hip or knee and refuses testing of knee extension due to pain. ADF/APF is 4-/5.  LUE: 4+/5 proximal and distal LLE: 2/5 hip flexion, 3/5 knee flexion, 4-/5 knee extension, 4/5 APF/ADF Sensory: Subjective temp sensation intact in all 4 extremities. FT intact without extinction to DSS.  Deep Tendon Reflexes:  Right biceps/brachioradialis 1+ Left biceps brachioradialis 2+ Left patella and achilles 0.  Right achilles 0 Defers right patella due to pain.  Cerebellar: Ataxia with FNF bilaterally, worse on the left. Has difficulty on the right due to weakness Gait: Deferred  Results for orders placed or performed during the hospital encounter of 01/13/19 (from the past 48 hour(s))  CBC with Differential/Platelet     Status: Abnormal   Collection Time: 01/18/19  4:24 AM  Result Value Ref Range   WBC 20.9 (H) 4.0 - 10.5 K/uL   RBC 4.47 4.22 - 5.81 MIL/uL   Hemoglobin 10.4 (L) 13.0 - 17.0 g/dL   HCT 96.0 (L) 45.4 - 09.8 %   MCV 68.0 (L) 80.0 - 100.0 fL   MCH 23.3 (L) 26.0 - 34.0 pg   MCHC 34.2 30.0 - 36.0 g/dL   RDW 11.9 14.7 - 82.9 %   Platelets 153 150 - 400 K/uL   nRBC 0.1 0.0 - 0.2 %   Neutrophils Relative % 75 %   Neutro Abs 15.4 (H) 1.7 - 7.7 K/uL   Lymphocytes Relative 11 %   Lymphs Abs 2.3 0.7 - 4.0 K/uL   Monocytes Relative 7 %   Monocytes Absolute 1.5 (H) 0.1 - 1.0 K/uL   Eosinophils Relative 0 %   Eosinophils Absolute 0.0 0.0 - 0.5 K/uL   Basophils Relative 0 %   Basophils Absolute 0.1 0.0 - 0.1 K/uL   WBC Morphology MILD LEFT SHIFT (1-5% METAS, OCC MYELO, OCC BANDS)    Immature Granulocytes 7 %   Abs Immature Granulocytes 1.55 (H) 0.00 - 0.07 K/uL    Comment: Performed at [facility], [address] [place], [name] [address]  Comprehensive metabolic panel     Status: Abnormal   Collection Time: 01/18/19  4:24 AM  Result Value Ref Range   Sodium 136 135 - 145 mmol/L   Potassium 5.1 3.5 - 5.1 mmol/L   Chloride 112 (H) 98 - 111 mmol/L   CO2 18 (L)  22 - 32 mmol/L   Glucose, Bld 95 70 - 99 mg/dL   BUN 51 (H) 8 - 23 mg/dL   Creatinine, Ser 8.25 (H) 0.61 - 1.24 mg/dL   Calcium 8.1 (L) 8.9 - 10.3 mg/dL   Total Protein 6.1 (L) 6.5 - 8.1 g/dL   Albumin 2.1 (L) 3.5 - 5.0 g/dL   AST 26 15 - 41 U/L   ALT 15 0 - 44 U/L   Alkaline Phosphatase 105 38 - 126 U/L   Total Bilirubin 1.4 (H) 0.3 - 1.2 mg/dL   GFR calc non Af Amer 41 (L) >60 mL/min   GFR calc Af Amer 48 (L) >60 mL/min   Anion gap 6 5 - 15    Comment: Performed at [facility], [address], [place], [name] [address]  CBC with Differential/Platelet     Status: Abnormal   Collection Time: 01/19/19  5:26 AM  Result Value Ref Range   WBC 17.3 (H) 4.0 - 10.5 K/uL   RBC 4.24 4.22 - 5.81 MIL/uL   Hemoglobin 10.0 (L) 13.0 - 17.0 g/dL   HCT 21.0 (L) 31.2 - 81.1 %   MCV 70.3 (L) 80.0 - 100.0 fL   MCH 23.6 (L) 26.0 - 34.0 pg   MCHC 33.6 30.0 - 36.0 g/dL   RDW 88.6 77.3 - 73.6 %   Platelets 224 150 - 400 K/uL   nRBC 0.1 0.0 - 0.2 %   Neutrophils Relative % 73 %   Neutro Abs 12.5 (H) 1.7 - 7.7 K/uL   Lymphocytes Relative 10 %   Lymphs Abs 1.7 0.7 - 4.0 K/uL   Monocytes Relative 12 %   Monocytes Absolute 2.1 (H) 0.1 - 1.0 K/uL   Eosinophils Relative 0 %   Eosinophils Absolute 0.1 0.0 - 0.5 K/uL   Basophils Relative 0 %   Basophils Absolute 0.1 0.0 - 0.1 K/uL   WBC Morphology INCREASED BANDS (>20% BANDS)    Immature Granulocytes 5 %   Abs Immature Granulocytes 0.89 (H) 0.00 - 0.07 K/uL   Schistocytes PRESENT    Polychromasia PRESENT    Target Cells PRESENT    Stomatocytes PRESENT     Comment: Performed at [facility], [address], [place], [name] [address]  Comprehensive metabolic panel     Status:  Abnormal   Collection Time: 01/19/19  5:26 AM  Result Value Ref Range   Sodium 135 135 - 145 mmol/L   Potassium 5.0 3.5 - 5.1 mmol/L   Chloride 110 98 - 111 mmol/L   CO2 19 (L) 22 - 32 mmol/L   Glucose, Bld 106 (H) 70 - 99 mg/dL   BUN 51 (H) 8 - 23 mg/dL   Creatinine, Ser 4.70 (H) 0.61 - 1.24 mg/dL   Calcium 8.0 (L) 8.9 - 10.3 mg/dL   Total Protein 6.3 (L) 6.5 - 8.1 g/dL   Albumin 2.0 (L) 3.5 - 5.0 g/dL   AST 26 15 - 41 U/L   ALT 7 0 - 44 U/L   Alkaline Phosphatase 102 38 - 126 U/L   Total Bilirubin 1.3 (H) 0.3 - 1.2 mg/dL   GFR calc non Af Amer 44 (L) >60 mL/min   GFR calc Af Amer 51 (L) >60 mL/min   Anion gap 6 5 - 15    Comment: Performed at [facility], [address], [place], [name] [address]   Mr Maxine Glenn Head Wo Contrast  Result Date: 01/19/2019 CLINICAL DATA:  Right upper extremity weakness EXAM: MRI  HEAD WITHOUT CONTRAST MRA HEAD WITHOUT CONTRAST TECHNIQUE: Multiplanar, multiecho pulse sequences of the brain and surrounding structures were obtained without intravenous contrast. Angiographic images of the head were obtained using MRA technique without contrast. COMPARISON:  Head CT 10/26/2016 FINDINGS: MRI HEAD FINDINGS BRAIN: There is scattered multifocal diffusion restriction throughout the brain. The largest areas of diffusion abnormality are in the cerebellum. There are bilateral areas of ischemia in the deep watershed distribution. There are multiple bilateral peripheral subcortical foci of ischemia. Areas along the left postcentral gyrus and right anterior convexity are suggestive subarachnoid hemorrhage. There is also magnetic susceptibility effect in the left cerebellum, also likely indicating an area of hemorrhage. There is mild edema corresponding to the sites of diffusion abnormality. Mild generalized volume loss. No midline shift. SKULL AND UPPER CERVICAL SPINE: The visualized skull base, calvarium, upper cervical spine and extracranial soft tissues are normal.  SINUSES/ORBITS: No fluid levels or advanced mucosal thickening. No mastoid or middle ear effusion. The orbits are normal. MRA HEAD FINDINGS POSTERIOR CIRCULATION: --Basilar artery: Normal. --Posterior cerebral arteries: Normal. The right PCA is partially supplied by the posterior communicating artery. --Superior cerebellar arteries: Normal. --Inferior cerebellar arteries: Normal left AICA. The posterior inferior cerebral arteries are normal. ANTERIOR CIRCULATION: --Intracranial internal carotid arteries: Normal. --Anterior cerebral arteries: Normal. Both A1 segments are present. Patent anterior communicating artery. --Middle cerebral arteries: Moderate narrowing of the M2 segments bilaterally. --Posterior communicating arteries: Present on the right, absent on the left. IMPRESSION: 1. Numerous scattered foci of acute ischemia throughout the cerebrum and cerebellum. Many of these lesions are most suggestive of embolic infarcts, given the widespread distribution over multiple vascular territories. Others are in a deep watershed distribution, as is seen in the setting of hypoperfusion events. 2. Areas of magnetic susceptibility effect superimposed on diffusion abnormality along the left postcentral gyrus, right frontal convexity and lateral ventricular atria are suggestive subarachnoid blood. Noncontrast head CT recommended for clarification. 3. No midline shift or other mass effect. 4. No proximal intracranial arterial occlusion. Moderate narrowing of the M2 segments of both middle cerebral arteries. Critical Value/emergent results were called by telephone at the time of interpretation on 01/19/2019 at 5:32 pm to Dr. Durward Mallard [facility] , who verbally acknowledged these results. Electronically Signed   By: Deatra Robinson M.D.   On: 01/19/2019 17:33   Mr Brain Wo Contrast  Result Date: 01/19/2019 CLINICAL DATA:  Right upper extremity weakness EXAM: MRI HEAD WITHOUT CONTRAST MRA HEAD WITHOUT CONTRAST TECHNIQUE:  Multiplanar, multiecho pulse sequences of the brain and surrounding structures were obtained without intravenous contrast. Angiographic images of the head were obtained using MRA technique without contrast. COMPARISON:  Head CT 10/26/2016 FINDINGS: MRI HEAD FINDINGS BRAIN: There is scattered multifocal diffusion restriction throughout the brain. The largest areas of diffusion abnormality are in the cerebellum. There are bilateral areas of ischemia in the deep watershed distribution. There are multiple bilateral peripheral subcortical foci of ischemia. Areas along the left postcentral gyrus and right anterior convexity are suggestive subarachnoid hemorrhage. There is also magnetic susceptibility effect in the left cerebellum, also likely indicating an area of hemorrhage. There is mild edema corresponding to the sites of diffusion abnormality. Mild generalized volume loss. No midline shift. SKULL AND UPPER CERVICAL SPINE: The visualized skull base, calvarium, upper cervical spine and extracranial soft tissues are normal. SINUSES/ORBITS: No fluid levels or advanced mucosal thickening. No mastoid or middle ear effusion. The orbits are normal. MRA HEAD FINDINGS POSTERIOR CIRCULATION: --Basilar artery: Normal. --Posterior cerebral arteries: Normal. The  right PCA is partially supplied by the posterior communicating artery. --Superior cerebellar arteries: Normal. --Inferior cerebellar arteries: Normal left AICA. The posterior inferior cerebral arteries are normal. ANTERIOR CIRCULATION: --Intracranial internal carotid arteries: Normal. --Anterior cerebral arteries: Normal. Both A1 segments are present. Patent anterior communicating artery. --Middle cerebral arteries: Moderate narrowing of the M2 segments bilaterally. --Posterior communicating arteries: Present on the right, absent on the left. IMPRESSION: 1. Numerous scattered foci of acute ischemia throughout the cerebrum and cerebellum. Many of these lesions are most  suggestive of embolic infarcts, given the widespread distribution over multiple vascular territories. Others are in a deep watershed distribution, as is seen in the setting of hypoperfusion events. 2. Areas of magnetic susceptibility effect superimposed on diffusion abnormality along the left postcentral gyrus, right frontal convexity and lateral ventricular atria are suggestive subarachnoid blood. Noncontrast head CT recommended for clarification. 3. No midline shift or other mass effect. 4. No proximal intracranial arterial occlusion. Moderate narrowing of the M2 segments of both middle cerebral arteries. Critical Value/emergent results were called by telephone at the time of interpretation on 01/19/2019 at 5:32 pm to Dr. Durward Mallard [facility] , who verbally acknowledged these results. Electronically Signed   By: Deatra Robinson M.D.   On: 01/19/2019 17:33   Mr Cervical Spine Wo Contrast  Result Date: 01/19/2019 CLINICAL DATA:  Shoulder pain and progressive weakness EXAM: MRI CERVICAL SPINE WITHOUT CONTRAST TECHNIQUE: Multiplanar, multisequence MR imaging of the cervical spine was performed. No intravenous contrast was administered. COMPARISON:  None. FINDINGS: Alignment: Normal. Vertebrae: No focal marrow lesion. No compression fracture or evidence of discitis osteomyelitis. Cord: Normal caliber and signal. Posterior Fossa, vertebral arteries, paraspinal tissues: Visualized posterior fossa is normal. Vertebral artery flow voids are preserved. No prevertebral effusion. Disc levels: Sagittal imaging includes the atlantoaxial joint to the level of the T2-3 disc space, with axial imaging of the disc spaces from C2-3 to C6-7. Axial images are degraded by motion. Within that limitation, there is no spinal canal stenosis above the C5 level. At C5-6, there is a small central disc protrusion that narrows the ventral thecal sac with mild spinal canal stenosis. At C6-7, there is a intermediate disc bulge causing mild spinal canal  stenosis. There is poor visualization of the neural foramina because of motion. IMPRESSION: 1. Motion degraded examination, particularly limiting assessment of the neural foramina. 2. Mild spinal canal stenosis at C5-6 and C6-7. 3. No acute abnormality of the cervical spine. Electronically Signed   By: Deatra Robinson M.D.   On: 01/19/2019 17:45   US Venous Img Upper Uni Right  Result Date: 01/18/2019 CLINICAL DATA:  Right upper extremity edema. History of diabetes. Evaluate DVT. EXAM: RIGHT UPPER EXTREMITY VENOUS DOPPLER ULTRASOUND TECHNIQUE: Gray-scale sonography with graded compression, as well as color Doppler and duplex ultrasound were performed to evaluate the upper extremity deep venous system from the level of the subclavian vein and including the jugular, axillary, basilic, radial, ulnar and upper cephalic vein. Spectral Doppler was utilized to evaluate flow at rest and with distal augmentation maneuvers. COMPARISON:  None. FINDINGS: Contralateral Subclavian Vein: Respiratory phasicity is normal and symmetric with the symptomatic side. No evidence of thrombus. Normal compressibility. Internal Jugular Vein: No evidence of thrombus. Normal compressibility, respiratory phasicity and response to augmentation. Subclavian Vein: No evidence of thrombus. Normal compressibility, respiratory phasicity and response to augmentation. Axillary Vein: No evidence of thrombus. Normal compressibility, respiratory phasicity and response to augmentation. Cephalic Vein: No evidence of thrombus. Normal compressibility, respiratory phasicity and response to  augmentation. Basilic Vein: No evidence of thrombus. Normal compressibility, respiratory phasicity and response to augmentation. Brachial Veins: No evidence of thrombus. Normal compressibility, respiratory phasicity and response to augmentation. Radial Veins: No evidence of thrombus. Normal compressibility, respiratory phasicity and response to augmentation. Ulnar Veins: No  evidence of thrombus. Normal compressibility, respiratory phasicity and response to augmentation. Venous Reflux:  None visualized. Other Findings:  None visualized. IMPRESSION: No evidence of DVT within the right upper extremity. Electronically Signed   By: Simonne Come M.D.   On: 01/18/2019 15:10   [place] Ekg Site Rite  Result Date: 01/18/2019 If [facility] image not attached, placement could not be confirmed due to current cardiac rhythm.  US Abdomen Limited Ruq  Result Date: 01/18/2019 CLINICAL DATA:  Increased liver function tests. EXAM: ULTRASOUND ABDOMEN LIMITED RIGHT UPPER QUADRANT COMPARISON:  CT abdomen pelvis July 11, 2018 FINDINGS: Gallbladder: No gallstones or wall thickening visualized. The gallbladder is distended. No sonographic Murphy sign noted by sonographer. Common bile duct: Diameter: 5 mm Liver: No focal lesion identified. Within normal limits in parenchymal echogenicity. Nodular contour of the liver is noted. Portal vein is patent on color Doppler imaging with normal direction of blood flow towards the liver. IMPRESSION: No evidence of acute cholecystitis. Nodular contour of liver, this can be seen in cirrhosis of liver. Electronically Signed   By: Sherian Rein M.D.   On: 01/18/2019 12:22    Assessment: 68 y.o. male with multiple acute ischemic infarctions on MRI, in a pattern that is most consistent with cardioembolic strokes.  1. Neurological examination reveals bilateral cerebellar ataxia and 3/5 RUE weakness. Also with BLE weakness, but unable to assess which lower extremity is worse as RLE exam is confounded by pain. The neurological deficits are compatible with the multifocal strokes seen on MRI. 2. MRI brain shows multiple bilateral acute ischemic infarctions, supratentorially and infratentorially, appearing most consistent with a cardioembolic etiology. Two of the infarcts show hemorrhagic transformation on MRI - these were read by Radiology as being probable foci of  subarachnoid blood, but on my review of the images they appear more consistent with hemorrhagic infarcts. The hemorrhagic transformation increases the likelihood of septic embolization as the etiology; however, TTE and TEE were negative for vegetations.  3. MRA head with no proximal intracranial arterial occlusion. Moderate narrowing of the M2 segments of both middle cerebral arteries is noted. 4. Stroke Risk Factors - Multiple stroke risk factors as listed in his PMHx 5. Etiology for his strokes is felt most likely to be cardioembolic in the setting of atrial fibrillation and anticoagulation having been held due to thrombocytopenia  Recommendations: 1. Continue antibiotic treatment. Would obtain repeat blood cultures after treatment is complete. A follow up TTE may also be indicated in addition to an MRI if he has new neurological symptoms after discharge.  2. Would restart Eliquis if no contraindications for secondary stroke prevention in the setting of his a-fib. His platelets have normalized and the petechial hemorrhagic transformations of two of his embolic infarctions appear to be at relatively low risk for conversion to gross hemorrhage.  3. Carotid ultrasound  4. PT consult, OT consult, Speech consult 5. Telemetry monitoring 6. Frequent neuro checks 7. Rate control for his a-fib    @Electronically  signed: Dr. Caryl Pina 01/20/2019, 12:54 AM

## 2019-01-20 NOTE — Progress Notes (Addendum)
STROKE TEAM PROGRESS NOTE   HISTORY OF PRESENT ILLNESS (per record) [name] is an 68 y.o. male with a history of atrial fibrillation on anticoagulation, CHF, history of subdural hematoma, HLD, HTN, TAVR placement in 9/19, sleep apnea and recent sepsis who is being seen in consultation for multifocal infarcts seen on MRI brain. He initially presented to the ED with right shoulder pain, fever, tachycardia and cellulitis of his RLE. He was diagnosed with severe sepsis, which was treated - his blood cultures were positive for group C strep. Given that he had a bioprosthetic valve he was at risk for vegetations - TTE showed no evidence of vegetations. ID was consulted, who recommended a TEE. TEE showed an EF of 60-65% with normal mitral and tricuspid valves, TAVR present in the expected location and mild plaque in the descending aorta - but again, no valvular vegetations were identified. He has been continued on a 2 week course of Ancef. Eliquis was temporarily held due to thrombocytopenia - the latter felt to be secondary to severe sepsis; of note, his platelets have been steadily improving and have normalized to 224 as of yesterday AM.   Due to persistent right sided weakness during this admission, he underwent an MRI brain that showed multiple bilateral acute ischemic infarctions, supratentorially and infratentorially, appearing most consistent with a cardioembolic etiology. Two of the infarcts show hemorrhagic transformation on MRI.   MRI brain/MRA head: 1. Numerous scattered foci of acute ischemia throughout the cerebrum and cerebellum. Many of these lesions are most suggestive of embolic infarcts, given the widespread distribution over multiple vascular territories. Others are in a deep watershed distribution, as is seen in the setting of hypoperfusion events. 2. Areas of magnetic susceptibility effect superimposed on diffusion abnormality along the left postcentral gyrus, right  frontal convexity and lateral ventricular atria are suggestive subarachnoid blood. Noncontrast head CT recommended for clarification. 3. No midline shift or other mass effect. 4. No proximal intracranial arterial occlusion. Moderate narrowing of the M2 segments of both middle cerebral arteries.   SUBJECTIVE (INTERVAL HISTORY) His family is not at bedside, he is alert and oriented, wanting to know how long he needs to stay in the hospital and worried about his prognosis.    OBJECTIVE Vitals:   01/20/19 0100 01/20/19 0300 01/20/19 0423 01/20/19 0600  BP: 101/72 104/73 114/74   Pulse:   67 63  Resp:   19   Temp:   99.1 F (37.3 C)   TempSrc:   Oral   SpO2:   99%   Weight:   128.1 kg   Height:        CBC:  Recent Labs  Lab 01/18/19 0424 01/19/19 0526  WBC 20.9* 17.3*  NEUTROABS 15.4* 12.5*  HGB 10.4* 10.0*  HCT 30.4* 29.8*  MCV 68.0* 70.3*  PLT 153 224    Basic Metabolic Panel:  Recent Labs  Lab 01/15/19 0415 01/16/19 0427  01/18/19 0424 01/19/19 0526  NA 139 139   < > 136 135  K 4.2 4.7   < > 5.1 5.0  CL 112* 112*   < > 112* 110  CO2 19* 19*   < > 18* 19*  GLUCOSE 99 92   < > 95 106*  BUN 46* 53*   < > 51* 51*  CREATININE 1.40* 1.50*   < > 1.68* 1.60*  CALCIUM 8.1* 8.2*   < > 8.1* 8.0*  MG 2.8* 3.0*  --   --   --    < > =  values in this interval not displayed.    Lipid Panel:     Component Value Date/Time   CHOL 122 05/01/2018 0913   TRIG 41 05/01/2018 0913   HDL 40 (L) 05/01/2018 0913   CHOLHDL 3.1 05/01/2018 0913   VLDL 9 11/26/2016 1406   LDLCALC 70 05/01/2018 0913   HgbA1c:  Lab Results  Component Value Date   HGBA1C 5.8 (H) 07/21/2018   Urine Drug Screen: No results found for: LABOPIA, COCAINSCRNUR, LABBENZ, AMPHETMU, THCU, LABBARB  Alcohol Level No results found for: [facility]  IMAGING  Mr Maxine Glenn Head Wo Contrast 01/19/2019 IMPRESSION:  1. Numerous scattered foci of acute ischemia throughout the cerebrum and cerebellum. Many of these lesions  are most suggestive of embolic infarcts, given the widespread distribution over multiple vascular territories. Others are in a deep watershed distribution, as is seen in the setting of hypoperfusion events.  2. Areas of magnetic susceptibility effect superimposed on diffusion abnormality along the left postcentral gyrus, right frontal convexity and lateral ventricular atria are suggestive subarachnoid blood. Noncontrast head CT recommended for clarification.  3. No midline shift or other mass effect.  4. No proximal intracranial arterial occlusion. Moderate narrowing of the M2 segments of both middle cerebral arteries.    Mr Cervical Spine Wo Contrast 01/19/2019 IMPRESSION:  1. Motion degraded examination, particularly limiting assessment of the neural foramina.  2. Mild spinal canal stenosis at C5-6 and C6-7. 3. No acute abnormality of the cervical spine.    US Venous Img Upper Uni Right 01/18/2019 IMPRESSION:  No evidence of DVT within the right upper extremity.    [place] Ekg Site Rite 01/18/2019 If [facility] image not attached, placement could not be confirmed due to current cardiac rhythm.   US Abdomen Limited Ruq 01/18/2019 IMPRESSION:  No evidence of acute cholecystitis. Nodular contour of liver, this can be seen in cirrhosis of liver.    Transthoracic Echocardiogram  4/24//2020 IMPRESSIONS    1. The left ventricle has normal systolic function with an ejection fraction of 60-65%. The cavity size was normal. There is mildly increased left ventricular wall thickness. Left ventricular diastolic Doppler parameters are indeterminate. No evidence  of left ventricular regional wall motion abnormalities.  2. The right ventricle has moderately reduced systolic function. The cavity was mildly enlarged. There is no increase in right ventricular wall thickness. Right ventricular systolic pressure is moderately elevated with an estimated pressure of 41.7  mmHg.  3. Left atrial size was  severely dilated.  4. Right atrial size was severely dilated.  5. The mitral valve is normal in structure. There is mild mitral annular calcification present.  6. The tricuspid valve is normal in structure.  7. A 26 Edwards Sapien bioprosthetic aortic valve (TAVR) valve is present in the aortic position. Normal function observed with mean gradient 15.7 mmHg. No perivalvular leak.  8. The pulmonic valve was grossly normal. Pulmonic valve regurgitation is mild by color flow Doppler.  9. The aortic root is normal in size and structure. 10. The inferior vena cava was dilated in size with <50% respiratory variability. 11. No evidence of left ventricular regional wall motion abnormalities.   TEE 01/18/2019 IMPRESSIONS  1. The left ventricle has normal systolic function, with an ejection fraction of 60-65%. No evidence of left ventricular regional wall motion abnormalities.  2. No evidence of left ventricular regional wall motion abnormalities.  3. Left atrial size was severely dilated. No left atrial appendage thrombus noted.  4. Right atrial size was severely dilated.  No right atrial appendage thrombus noted.  5. The mitral valve is normal in structure. There is mild to moderate mitral annular calcification present.  6. The tricuspid valve was normal in structure. Tricuspid valve regurgitation is mild-moderate.  7. A 26 Edwards Sapien bioprosthetic aortic valve (TAVR) valve is present in the aortic position.  8. The right ventricle has moderately reduced systolic function. The cavity was mildly enlarged.  9. There is evidence of mild plaque in the descending aorta. 10. No valvular vegetations are identified.   Bilateral Carotid Dopplers  07/10/2018 Final Interpretation: Right Carotid: Velocities in the right ICA are consistent with a 1-39% stenosis. Left Carotid: Velocities in the left ICA are consistent with a 1-39% stenosis. Vertebrals:  Bilateral vertebral arteries demonstrate antegrade  flow. Subclavians: Normal flow hemodynamics were seen in bilateral subclavian arteries.    PHYSICAL EXAM Blood pressure 114/74, pulse 63, temperature 99.1 F (37.3 C), temperature source Oral, resp. rate 19, height 6\' 1"  (1.854 m), weight 128.1 kg, SpO2 99 %.  Exam: NAD, pleasant                  Speech:    Speech is normal; fluent and spontaneous with normal comprehension.  Cognition:    The patient is oriented to person, place, and time;     recent and remote memory intact;     language fluent;    Cranial Nerves:    The pupils are equal, round, and reactive to light.Trigeminal sensation is intact and the muscles of mastication are normal. The face is symmetric. The palate elevates in the midline. Hearing intact. Voice is normal. Shoulder shrug is normal. The tongue has normal motion without fasciculations.   Motor Observation:    No asymmetry, no atrophy, and no involuntary movements noted. Tone:    Normal muscle tone.     Strength:    RUE and RLE 3/5. LUE and LLE 4+/5.       Sensation: intact to LT         ASSESSMENT/PLAN Joel Rogers is a 68 y.o. male with history of atrial fibrillation on anticoagulation, CHF, history of subdural hematoma, HLD, HTN, TAVR placement in 9/19, sleep apnea and recent sepsis presenting with persistent right sided weakness and multifocal infarcts seen on MRI brain. He did not receive IV t-PA due to unknown time of onset.  Strokes:  Multifocal infarcts throughout the cerebrum and cerebellum - embolic from afib  Resultant  Diffuse weakness  CT head - not performed  MRI head - Numerous scattered foci of acute ischemia throughout the cerebrum and cerebellum.   MRA head  - unremarkable  CTA H&N - - not performed  Abdominal US - Nodular contour of liver, this can be seen in cirrhosis of liver.   Carotid Doppler - 07/10/2018 - unremarkable  2D Echo / TEE  - EF 60 - 65%. No cardiac source of emboli identified. No  vegetations.  LDL - 70  HgbA1c - 5.8  UDS - not performed  VTE prophylaxis - SCDs  Diet  - Heart healthy with thin liquids.  aspirin 81 mg daily and Eliquis (apixaban) daily prior to admission, now on aspirin 325 mg daily. He is at high risk for stroke off of Eliquis, will get a CT head for clarification of any hemorrhage. Stay on ASA 325mg .   Patient counseled to be compliant with his antithrombotic medications  Ongoing aggressive stroke risk factor management  Therapy recommendations:  pending  Disposition:  Pending  Hypertension Blood pressure somewhat low at times. Permissive hypertension (OK if < 220/120) but gradually normalize in 5-7 days . Long-term BP goal normotensive  Hyperlipidemia  Lipid lowering medication PTA:  Lipitor 80 mg daily  LDL pending, goal < 70  Current lipid lowering medication: Lipitor 80 mg daily  Continue statin at discharge  Diabetes  HgbA1c 5.8, goal < 7.0  Controlled  Other Stroke Risk Factors  Advanced age  Former cigarette smoker - quit  Obesity, Body mass index is 37.26 kg/m., recommend weight loss, diet and exercise as appropriate   Obstructive sleep apnea, on CPAP at home  Other Active Problems  Sepsis - WBCs 20.9-> 17.3 ; Temp 99.1 (Ancef started 01/16/19)  Mild anemia  Possible subarachnoid blood. Noncontrast head CT recommended for clarification. Ordered.  Renal Insufficiency - Creatinine 1.6 ; BUN - 51  [place] Abdomen Limited Ruq - Nodular contour of liver, this can be seen in cirrhosis of liver.   Plan: - CT of the head showed a small SAH. [facility] Repeat CT Head in 2-3 days and if resolved restart Eliquis (and 81mg  ASA if he previously on both) - Stroke can follow back up if needed after repeat CT of the head in 2-3 days. Continue ASA 325mg  until then.   Hospital day # 7  Personally examined patient and images, and have participated in and made any corrections needed to history,  physical, neuro exam,assessment and plan as stated above.  I have personally obtained the history, evaluated lab date, reviewed imaging studies and agree with radiology interpretations.    Naomie DeanAntonia Zauria Dombek, MD Stroke Neurology   A total of 25 minutes was spent for the care of this patient, spent on counseling patient and family on different diagnostic and therapeutic options, counseling and coordination of care, riskd ans benefits of management, compliance, or risk factor reduction and education.   To contact Stroke Continuity provider, please refer to [email or web address]. After hours, contact General Neurology

## 2019-01-20 NOTE — Evaluation (Signed)
Occupational Therapy Evaluation Patient Details Name: Joel Rogers MRN: [id number] DOB: 1950/11/25 Today's Date: 01/20/2019    History of Present Illness Pt is a 68 y/o male admitted secondary to R UE and R LE pain with sepsis. During admission, pt with persistent R sided weakness. An MRI of the brain revealed multiple acute ischemic infarcts in a pattern that is most consistent with cardioembolic strokes. PMH including but not limited to atrial fibrillation on anticoagulation, CHF, history of subdural hematoma, HLD, HTN, TAVR placement in 9/19 and sleep apnea.   Clinical Impression   This 68 y/o male presents with the above. At baseline pt reports he is independent with ADL, iADL and functional mobility, reports he was actively going to the [facility] using the pool. Pt very pleasant and motivated to work with therapy today, presenting supine in bed. Spouse present and supportive throughout session as well. Pt currently has limitations including RUE deficits, RLE pain, decreased sitting and standing balance, generalized weakness. Pt requiring maxA+2 for bed mobility, tolerating sitting EOB >10 min during session with fluctuating levels of assist (min-maxA pending level of fatigue). Attempted to stand with two person assist however pt limited due to increased pain with wt bearing through RLE. Pt is highly motivated to return to PLOF. He will benefit from continued acute OT services and feel he is an excellent candidate for CIR level therapy services at time of discharge to maximize his safety and independence with ADL and mobility prior to return home. Will follow.     Follow Up Recommendations  CIR;Supervision/Assistance - 24 hour    Equipment Recommendations  Other (comment)(TBD in next venue)           Precautions / Restrictions Precautions Precautions: Fall Restrictions Weight Bearing Restrictions: No      Mobility Bed Mobility Overal bed mobility: Needs Assistance Bed Mobility:  Supine to Sit;Sit to Supine     Supine to sit: Max assist;+2 for physical assistance;HOB elevated Sit to supine: Max assist;+2 for physical assistance   General bed mobility comments: increased time and effort, use of bed rail with L UE, heavy assistance needed for R LE movement off of and onto bed, heavy assistance with trunk management  Transfers Overall transfer level: Needs assistance               General transfer comment: attempted to stand from EOB x1 with max A x2; however, pt crying out in pain (R hip) and unable to tolerate any weight bearing    Balance Overall balance assessment: Needs assistance Sitting-balance support: Feet supported;Single extremity supported Sitting balance-Leahy Scale: Poor Sitting balance - Comments: pt with posterior and L lateral lean; progressing from mod-max A to brief periods of min guard with L UE support Postural control: Posterior lean;Left lateral lean                                 ADL either performed or assessed with clinical judgement   ADL Overall ADL's : Needs assistance/impaired Eating/Feeding: Set up;Bed level   Grooming: Min guard;Set up;Bed level;Wash/dry face;Sitting Grooming Details (indicate cue type and reason): supported sitting Upper Body Bathing: Minimal assistance;Sitting   Lower Body Bathing: Maximal assistance;+2 for physical assistance;+2 for safety/equipment;Sitting/lateral leans   Upper Body Dressing : Moderate assistance;Sitting   Lower Body Dressing: Maximal assistance;+2 for physical assistance;+2 for safety/equipment;Sitting/lateral leans       Toileting- Clothing Manipulation and Hygiene: Total assistance;+2 for physical  assistance;+2 for safety/equipment;Bed level       Functional mobility during ADLs: Maximal assistance;+2 for physical assistance;+2 for safety/equipment General ADL Comments: pt very motivated to return to PLOF, limited due to weakness and painful RLE at this time.  pt sat EOB >10 min during this session with assist for sitting balance     Vision         Perception     Praxis      Pertinent Vitals/Pain Pain Assessment: Faces Faces Pain Scale: Hurts even more Pain Location: R hip Pain Descriptors / Indicators: Guarding;Sore Pain Intervention(s): Monitored during session;Repositioned;Limited activity within patient's tolerance     Hand Dominance Right   Extremity/Trunk Assessment Upper Extremity Assessment Upper Extremity Assessment: RUE deficits/detail;Generalized weakness RUE Deficits / Details: grossly 2+/5, increased swelling noted in RUE, decreased sensation RUE Sensation: decreased light touch RUE Coordination: decreased fine motor;decreased gross motor   Lower Extremity Assessment Lower Extremity Assessment: Defer to PT evaluation       Communication Communication Communication: No difficulties   Cognition Arousal/Alertness: Awake/alert Behavior During Therapy: WFL for tasks assessed/performed Overall Cognitive Status: Within Functional Limits for tasks assessed                                 General Comments: pt is very sweet, pleasant and motivated to work with therapy   General Comments  family present during session, spoke to nursing staff re: using maximove for OOB to chair, VSS    Exercises Exercises: General Lower Extremity;Other exercises;General Upper Extremity General Exercises - Upper Extremity Digit Composite Flexion: AROM;10 reps Composite Extension: AROM;10 reps General Exercises - Lower Extremity Ankle Circles/Pumps: AROM;Both;10 reps;Seated Long Arc Quad: AROM;Strengthening;Both;10 reps;Seated Other Exercises Other Exercises: seated weight shifting activity, laterally and A-P with cueing and min A   Shoulder Instructions      Home Living Family/patient expects to be discharged to:: Private residence Living Arrangements: Spouse/significant other Available Help at Discharge:  Family;Available 24 hours/day Type of Home: House Home Access: Stairs to enter Entergy CorporationEntrance Stairs-Number of Steps: 3 Entrance Stairs-Rails: Right Home Layout: One level     Bathroom Shower/Tub: Tub/shower unit;Walk-in shower   Bathroom Toilet: Handicapped height     Home Equipment: Environmental consultantWalker - 2 wheels;Cane - single point          Prior Functioning/Environment Level of Independence: Independent        Comments: community ambulator, drives, [facility] [employer] and [facility]        OT Problem List: Decreased strength;Decreased range of motion;Decreased activity tolerance;Impaired balance (sitting and/or standing);Pain;Obesity;Impaired UE functional use;Impaired sensation;Increased edema      OT Treatment/Interventions: Self-care/ADL training;Therapeutic exercise;Neuromuscular education;DME and/or AE instruction;Manual therapy;Therapeutic activities;Visual/perceptual remediation/compensation;Patient/family education;Balance training    OT Goals(Current goals can be found in the care plan section) Acute Rehab OT Goals Patient Stated Goal: return home after rehab OT Goal Formulation: With patient Time For Goal Achievement: 02/03/19 Potential to Achieve Goals: Good  OT Frequency: Min 2X/week   Barriers to D/C:            Co-evaluation PT/OT/SLP Co-Evaluation/Treatment: Yes Reason for Co-Treatment: For patient/therapist safety;To address functional/ADL transfers PT goals addressed during session: Mobility/safety with mobility;Balance;Strengthening/ROM OT goals addressed during session: ADL's and self-care;Strengthening/ROM      AM-PAC OT "6 Clicks" Daily Activity     Outcome Measure Help from another person eating meals?: A Little Help from another person taking  care of personal grooming?: A Little Help from another person toileting, which includes using toliet, bedpan, or urinal?: Total Help from another person bathing (including washing, rinsing,  drying)?: A Lot Help from another person to put on and taking off regular upper body clothing?: A Lot Help from another person to put on and taking off regular lower body clothing?: Total 6 Click Score: 12   End of Session Equipment Utilized During Treatment: Gait belt Nurse Communication: Mobility status;Need for lift equipment  Activity Tolerance: Patient tolerated treatment well Patient left: in bed;with call bell/phone within reach;with bed alarm set;with family/visitor present  OT Visit Diagnosis: Muscle weakness (generalized) (M62.81);Other abnormalities of gait and mobility (R26.89);Pain Pain - Right/Left: Right Pain - part of body: Hip                Time: 9373-4287 OT Time Calculation (min): 44 min Charges:  OT General Charges $OT Visit: 1 Visit OT Evaluation $OT Eval Moderate Complexity: 1 Mod OT Treatments $Self Care/Home Management : 8-22 mins  Marcy Siren, OT Supplemental Rehabilitation Services Pager [phone] Office [phone]   Orlando Penner 01/20/2019, 3:59 PM

## 2019-01-20 NOTE — Progress Notes (Signed)
Paged Linton Flemings regarding patient having nausea and vomiting a moderate amount of bright red blood mixed with saliva.  Verbal order to monitor the patient.

## 2019-01-20 NOTE — Progress Notes (Addendum)
Physical Therapy Treatment Patient Details Name: Joel Rogers MRN: [id number] DOB: 07-06-1951 Today's Date: 01/20/2019    History of Present Illness Pt is a 68 y/o male admitted secondary to R UE and R LE pain with sepsis. During admission, pt with persistent R sided weakness. An MRI of the brain revealed multiple acute ischemic infarcts in a pattern that is most consistent with cardioembolic strokes. PMH including but not limited to atrial fibrillation on anticoagulation, CHF, history of subdural hematoma, HLD, HTN, TAVR placement in 9/19 and sleep apnea.    PT Comments    Pt seen for re-evaluation following transfer to [facility] for MRI confirmed stroke. Pt currently requires max A x2 for bed mobility. Attempted sit<>stand transfer with max A x2; however, pt with significant R hip pain limiting his ability to weight bear to achieve standing position. Pt able to tolerate sitting EOB for ~20 minutes, progressing from mod-max A to brief periods of min guard with L UE support. Pt worked on weight shifting in sitting, laterally as well as A-P. Pt would greatly benefit from further intensive therapy services at [facility] to maximize his independence with functional mobility prior to returning home with family support. PT will continue to follow acutely.     Follow Up Recommendations  [facility]     Equipment Recommendations  Other (comment)(defer to next venue of care)    Recommendations for Other Services Rehab consult     Precautions / Restrictions Precautions Precautions: Fall Restrictions Weight Bearing Restrictions: No    Mobility  Bed Mobility Overal bed mobility: Needs Assistance Bed Mobility: Supine to Sit;Sit to Supine     Supine to sit: Max assist;+2 for physical assistance;HOB elevated Sit to supine: Max assist;+2 for physical assistance   General bed mobility comments: increased time and effort, use of bed rail with L UE, heavy assistance needed for R LE movement off of and onto bed,  heavy assistance with trunk management  Transfers                 General transfer comment: attempted to stand from EOB x1 with max A x2; however, pt crying out in pain (R hip) and unable to tolerate any weight bearing  Ambulation/Gait                 Stairs             Wheelchair Mobility    Modified Rankin (Stroke Patients Only) Modified Rankin (Stroke Patients Only) Pre-Morbid Rankin Score: No symptoms Modified Rankin: Severe disability     Balance Overall balance assessment: Needs assistance Sitting-balance support: Feet supported;Single extremity supported Sitting balance-Leahy Scale: Poor Sitting balance - Comments: pt with posterior and L lateral lean; progressing from mod-max A to brief periods of min guard with L UE support Postural control: Posterior lean;Left lateral lean                                  Cognition Arousal/Alertness: Awake/alert Behavior During Therapy: WFL for tasks assessed/performed Overall Cognitive Status: Within Functional Limits for tasks assessed                                 General Comments: pt is very sweet, pleasant and motivated to work with therapy      Exercises General Exercises - Lower Extremity Ankle Circles/Pumps: AROM;Both;10 reps;Seated Long Arc Quad:  AROM;Strengthening;Both;10 reps;Seated Other Exercises Other Exercises: seated weight shifting activity, laterally and A-P with cueing and min A    General Comments        Pertinent Vitals/Pain Pain Assessment: Faces Faces Pain Scale: Hurts even more Pain Location: R hip Pain Descriptors / Indicators: Guarding;Sore Pain Intervention(s): Monitored during session;Repositioned    Home Living                      Prior Function            PT Goals (current goals can now be found in the care plan section) Acute Rehab PT Goals PT Goal Formulation: With patient/family Time For Goal Achievement:  01/29/19 Potential to Achieve Goals: Good Progress towards PT goals: Progressing toward goals    Frequency    Min 4X/week      PT Plan Frequency needs to be updated;Discharge plan needs to be updated    Co-evaluation PT/OT/SLP Co-Evaluation/Treatment: Yes Reason for Co-Treatment: For patient/therapist safety;To address functional/ADL transfers PT goals addressed during session: Mobility/safety with mobility;Balance;Strengthening/ROM        AM-PAC PT "6 Clicks" Mobility   Outcome Measure  Help needed turning from your back to your side while in a flat bed without using bedrails?: A Lot Help needed moving from lying on your back to sitting on the side of a flat bed without using bedrails?: A Lot Help needed moving to and from a bed to a chair (including a wheelchair)?: Total Help needed standing up from a chair using your arms (e.g., wheelchair or bedside chair)?: Total Help needed to walk in hospital room?: Total Help needed climbing 3-5 steps with a railing? : Total 6 Click Score: 8    End of Session Equipment Utilized During Treatment: Gait belt Activity Tolerance: Patient limited by pain Patient left: in bed;with call bell/phone within reach;with bed alarm set;with family/visitor present Nurse Communication: Mobility status;Need for lift equipment PT Visit Diagnosis: Unsteadiness on feet (R26.81);Other abnormalities of gait and mobility (R26.89);Muscle weakness (generalized) (M62.81)     Time: 7169-6789 PT Time Calculation (min) (ACUTE ONLY): 45 min  Charges:  $Therapeutic Activity: 8-22 mins                     Deborah Chalk, PT, DPT  Acute Rehabilitation Services Pager [phone] Office [phone]     Alessandra Bevels Sheyla Zaffino 01/20/2019, 2:06 PM

## 2019-01-20 NOTE — Progress Notes (Signed)
PROGRESS NOTE  Joel Rogers  QBH:[id number]  DOB: Dec 17, 1950  DOA: 01/13/2019 PCP: [name], MD  Brief Admission Hx: 68 y/o male admitted to the hospital with sepsis and streptococcal bacteremia secondary to right lower extremity cellulitis.  He has been treated with intravenous antibiotics overall sepsis is improved.  Since he has a history of bioprosthetic aortic valve, TEE was performed that did not show any evidence of vegetations.  Since admission, has had persistent right-sided weakness.  His limited range of motion was initially felt to be related to a fall that he had prior to admission where pain was limiting his motion.  Since his weakness persisted, he underwent MRI brain that showed bilateral infarcts.  [name] to [name] for further work-up and neurology evaluation.   MDM/Assessment & Plan:    Severe sepsis - Present on admission, resolved now, she is secondary to Streptococcus bacteremia and cellulitis  - His blood cultures positive for group C strep.  He is currently on Ancef.   Streptococcus bacteremia - source is secondary to right lower extremity cellulitis.  He did complain of some pain in his right shoulder, but this joint does not appear to be erythematous, tender, swollen.  No evidence of joint effusion.  Patient had 1 out of 2 positive blood cultures for group C streptococcus.  Repeat blood cultures on 2/24 have not shown any growth.  2D echocardiogram did not show any evidence of vegetations.  Since patient has bioprosthetic aortic valve, TEE performed on 2/27 did not show any evidence of vegetations.  PICC line has been placed and he will complete a 2-week course of Ancef.  Acute CVA. -Patient with right-sided weakness, x-rays of right shoulder, right hip were unrevealing, seen to have right-sided weakness during hospital stay, MRI of the brain was obtained, which did show bilateral infarct. -Surgery input greatly appreciated, recommendation to resume  back on anticoagulation as it felt like to be embolic stroke/ -MRA head was unremarkable, carotid Doppler still pending, 2D echo with a preserved EF, with no embolic source could be identified.   AKI on CKD stage III - Plan creatinine is 1.2, creatinine peaked at 1.68 during hospital stay, continue to monitor  Cellulitis of RLE  - due to streptococcus infection, clinically looking better, [place] RLE negative for DVT.   Chronic atrial fibrillation - He is on rate control with Cardizem.  He has been chronically taking Eliquis.  This was held on admission due to severe thrombocytopenia.  I have discussed with the neurology, patient can be back on Eliquis .  Thrombocytopenia - patient was initially severely thrombocytopenic, felt secondary to sepsis.  Since being treated with antibiotics and sepsis improving, overall platelet count has been improving.  Leukocytosis  - marked bump in WBC - clinically patient is improving.  WBC count trending down with IV antibiotics.  Status post TAVR for severe aortic stenosis -he is followed by Dr. Allyson Sabal.  He had been doing fairly well since surgery.    Acute on chronic right shoulder pain - plain film x-rays only showed arthritic changes in the right shoulder.  Joint does not appear to be inflamed, tender or swollen.  Essential hypertension - blood pressures currently stable and controlled.  Follow.  Hyperbilirubinemia.  - Trending down with IV fluids since admission.  Right upper quadrant ultrasound is unremarkable.  Right arm swelling.  -  Venous Dopplers negative for DVT  DVT prophylaxis: SCDs, on Eliquis Code Status: Full Family Communication: None at bedside Disposition  Plan: Pending further work-up  Consultants:  Cardiology for TEE  Procedures: Echo: 1. The left ventricle has normal systolic function with an ejection fraction of 60-65%. The cavity size was normal. There is mildly increased left ventricular wall thickness. Left ventricular  diastolic Doppler parameters are indeterminate. No evidence  of left ventricular regional wall motion abnormalities.  2. The right ventricle has moderately reduced systolic function. The cavity was mildly enlarged. There is no increase in right ventricular wall thickness. Right ventricular systolic pressure is moderately elevated with an estimated pressure of 41.7  mmHg.  3. Left atrial size was severely dilated.  4. Right atrial size was severely dilated.  5. The mitral valve is normal in structure. There is mild mitral annular calcification present.  6. The tricuspid valve is normal in structure.  7. A 26 Edwards Sapien bioprosthetic aortic valve (TAVR) valve is present in the aortic position. Normal function observed with mean gradient 15.7 mmHg. No perivalvular leak.  8. The pulmonic valve was grossly normal. Pulmonic valve regurgitation is mild by color flow Doppler.  9. The aortic root is normal in size and structure. 10. The inferior vena cava was dilated in size with <50% respiratory variability.  11. No evidence of left ventricular regional wall motion abnormalities.  Antimicrobials:  Cefepime 2/22-2/24  Vancomycin 2/22 >> 2/25  Metronidazole 2/22 >2/13  Ancef 2/25 >  Subjective: Overall he is feeling better.  Still has difficulty moving his right arm and his right leg.  Objective: Vitals:   01/20/19 0100 01/20/19 0300 01/20/19 0423 01/20/19 0600  BP: 101/72 104/73 114/74   Pulse:   67 63  Resp:   19   Temp:   99.1 F (37.3 C)   TempSrc:   Oral   SpO2:   99%   Weight:   128.1 kg   Height:        Intake/Output Summary (Last 24 hours) at 01/20/2019 1206 Last data filed at 01/20/2019 0540 Gross per 24 hour  Intake 220 ml  Output 1000 ml  Net -780 ml   Filed Weights   01/13/19 1356 01/19/19 2106 01/20/19 0423  Weight: 110.7 kg 123.3 kg 128.1 kg   REVIEW OF SYSTEMS  As per history otherwise all reviewed and reported negative  Exam:  General exam: Awake  alert x3, pleasant Respiratory system: Good air entry, clear to auscultation Cardiovascular system: regular rate and rhythm, no rubs or gallops Gastrointestinal system: Abdomen soft, nondistended, nontender, bowel sounds present  Central nervous system: Alert and oriented.  2-3 out of 5 strength in the right upper and lower extremities.  5 out of 5 strength in the left upper and lower extremities.  Cranial nerves are grossly intact.  Gait was not checked. Extremities: Right upper extremity is more swollen than the left, right lower extremity has more swelling than the left Skin: As pictured below.  Overall erythema in right lower extremity is substantially improved Psychiatry: Judgement and insight appear normal. Mood & affect appropriate.     Basic Metabolic Panel: Recent Labs  Lab 01/13/19 1947  01/15/19 0415 01/16/19 0427 01/17/19 0418 01/18/19 0424 01/19/19 0526  NA  --    < > 139 139 138 136 135  K  --    < > 4.2 4.7 4.9 5.1 5.0  CL  --    < > 112* 112* 112* 112* 110  CO2  --    < > 19* 19* 19* 18* 19*  GLUCOSE  --    < >  99 92 100* 95 106*  BUN  --    < > 46* 53* 51* 51* 51*  CREATININE  --    < > 1.40* 1.50* 1.45* 1.68* 1.60*  CALCIUM  --    < > 8.1* 8.2* 8.2* 8.1* 8.0*  MG 1.9  --  2.8* 3.0*  --   --   --    < > = values in this interval not displayed.   Liver Function Tests: Recent Labs  Lab 01/15/19 0415 01/16/19 0427 01/17/19 0418 01/18/19 0424 01/19/19 0526  AST 43* 38 ALT ALKPHOS 90 96 105 105 102  BILITOT 2.0* 2.3* 1.6* 1.4* 1.3*  PROT 5.6* 6.2* 6.0* 6.1* 6.3*  ALBUMIN 2.3* 2.4* 2.3* 2.1* 2.0*   No results for input(s): LIPASE, AMYLASE in the last 168 hours. No results for input(s): AMMONIA in the last 168 hours. CBC: Recent Labs  Lab 01/15/19 0415 01/16/19 0427 01/17/19 0418 01/18/19 0424 01/19/19 0526  WBC 31.0* 26.2* 24.2* 20.9* 17.3*  NEUTROABS 27.1* 22.4* 19.4* 15.4* 12.5*  HGB 11.6* 11.5* 11.0* 10.4* 10.0*  HCT  34.0* 33.8* 31.7* 30.4* 29.8*  MCV 70.0* 68.6* 69.7* 68.0* 70.3*  PLT 62* 78* 105* 153 224   Cardiac Enzymes: No results for input(s): CKTOTAL, CKMB, CKMBINDEX, TROPONINI in the last 168 hours. CBG (last 3)  No results for input(s): GLUCAP in the last 72 hours. Recent Results (from the past 240 hour(s))  Culture, blood (routine x 2)     Status: None   Collection Time: 01/13/19  3:51 PM  Result Value Ref Range Status   Specimen Description BLOOD LEFT ARM  Final   Special Requests   Final    BOTTLES DRAWN AEROBIC ONLY Blood Culture adequate volume   Culture   Final    NO GROWTH 5 DAYS Performed at [facility], [address], [place], [name] [address]    Report Status 01/18/2019 FINAL  Final  Urine culture     Status: Abnormal   Collection Time: 01/13/19  5:40 PM  Result Value Ref Range Status   Specimen Description   Final    URINE, CATHETERIZED Performed at [facility], [address] [place], [name] [address]    Special Requests   Final    NONE Performed at [facility], [address] [place], [name] [address]    Culture (A)  Final    <10,000 COLONIES/mL INSIGNIFICANT GROWTH Performed at [facility], [address], [place], [name] [address]    Report Status 01/15/2019 FINAL  Final  Culture, blood (routine x 2)     Status: Abnormal   Collection Time: 01/13/19  7:47 PM  Result Value Ref Range Status   Specimen Description   Final    BLOOD LEFT ARM Performed at [facility], [address] [place], [name] [address]    Special Requests   Final    BOTTLES DRAWN AEROBIC AND ANAEROBIC Blood Culture adequate volume Performed at [facility], [address], [place], [name] [address]    Culture  Setup Time   Final    GRAM POSITIVE COCCI IN CHAINS Gram Stain Report Called to,Read Back By and Verified With: BOUDERANT,R @ 0516 ON 01/14/19 BY JUW GS DONE @ [facility] CRITICAL RESULT CALLED TO, READ BACK BY AND VERIFIED WITH: S. HALL, PHARMD ([facility]) AT 1010 ON 01/14/19 BY C.  JESSUP, MLT. Performed at [facility], [address] [place],  [place] [address]    Culture STREPTOCOCCUS GROUP C (A)  Final   Report Status 01/16/2019 FINAL  Final   Organism ID, Bacteria STREPTOCOCCUS GROUP C  Final      Susceptibility   Streptococcus group c - MIC*    CLINDAMYCIN <=0.25 SENSITIVE Sensitive     AMPICILLIN <=0.25 SENSITIVE Sensitive     ERYTHROMYCIN <=0.12 SENSITIVE Sensitive     VANCOMYCIN <=0.12 SENSITIVE Sensitive     CEFTRIAXONE <=0.12 SENSITIVE Sensitive     LEVOFLOXACIN 0.5 SENSITIVE Sensitive     PENICILLIN Value in next row Sensitive      SENSITIVE<=0.06    * STREPTOCOCCUS GROUP C  Blood Culture ID Panel (Reflexed)     Status: Abnormal   Collection Time: 01/13/19  7:47 PM  Result Value Ref Range Status   Enterococcus species NOT DETECTED NOT DETECTED Final   Listeria monocytogenes NOT DETECTED NOT DETECTED Final   Staphylococcus species NOT DETECTED NOT DETECTED Final   Staphylococcus aureus (BCID) NOT DETECTED NOT DETECTED Final   Streptococcus species DETECTED (A) NOT DETECTED Final    Comment: Not Enterococcus species, Streptococcus agalactiae, Streptococcus pyogenes, or Streptococcus pneumoniae. CRITICAL RESULT CALLED TO, READ BACK BY AND VERIFIED WITH: S. HALL, PHARMD ([facility]) AT 1010 ON 01/14/19 BY C. JESSUP, MLT.    Streptococcus agalactiae NOT DETECTED NOT DETECTED Final   Streptococcus pneumoniae NOT DETECTED NOT DETECTED Final   Streptococcus pyogenes NOT DETECTED NOT DETECTED Final   Acinetobacter baumannii NOT DETECTED NOT DETECTED Final   Enterobacteriaceae species NOT DETECTED NOT DETECTED Final   Enterobacter cloacae complex NOT DETECTED NOT DETECTED Final   Escherichia coli NOT DETECTED NOT DETECTED Final   Klebsiella oxytoca NOT DETECTED NOT DETECTED Final   Klebsiella pneumoniae NOT DETECTED NOT DETECTED Final   Proteus species NOT DETECTED NOT DETECTED Final   Serratia marcescens NOT DETECTED NOT DETECTED Final   Haemophilus  influenzae NOT DETECTED NOT DETECTED Final   Neisseria meningitidis NOT DETECTED NOT DETECTED Final   Pseudomonas aeruginosa NOT DETECTED NOT DETECTED Final   Candida albicans NOT DETECTED NOT DETECTED Final   Candida glabrata NOT DETECTED NOT DETECTED Final   Candida krusei NOT DETECTED NOT DETECTED Final   Candida parapsilosis NOT DETECTED NOT DETECTED Final   Candida tropicalis NOT DETECTED NOT DETECTED Final    Comment: Performed at [facility], [address], [place], [name] [address]  Culture, blood (Routine X 2) w Reflex to ID Panel     Status: None   Collection Time: 01/15/19  8:52 AM  Result Value Ref Range Status   Specimen Description BLOOD LEFT FOREARM  Final   Special Requests   Final    BOTTLES DRAWN AEROBIC AND ANAEROBIC Blood Culture adequate volume   Culture   Final    NO GROWTH 5 DAYS Performed at [facility], [address] [place], [name] [address]    Report Status 01/20/2019 FINAL  Final  Culture, blood (Routine X 2) w Reflex to ID Panel     Status: None   Collection Time: 01/15/19  9:05 AM  Result Value Ref Range Status   Specimen Description BLOOD LEFT HAND  Final   Special Requests   Final    BOTTLES DRAWN AEROBIC AND ANAEROBIC Blood Culture results may not be optimal due to an excessive volume of blood received in culture bottles   Culture   Final    NO GROWTH 5 DAYS Performed at [facility], [address] [place], [name]  [address]    Report Status 01/20/2019 FINAL  Final     Studies: Mr Shirlee Latch ZH Contrast  Result Date: 01/19/2019 CLINICAL DATA:  Right upper extremity weakness EXAM: MRI HEAD WITHOUT CONTRAST MRA HEAD WITHOUT CONTRAST TECHNIQUE: Multiplanar, multiecho pulse sequences of the brain and surrounding structures were obtained without intravenous contrast. Angiographic images of the head were obtained using MRA technique without contrast. COMPARISON:  Head CT 10/26/2016 FINDINGS: MRI HEAD FINDINGS BRAIN: There is scattered  multifocal diffusion restriction throughout the brain. The largest areas of diffusion abnormality are in the cerebellum. There are bilateral areas of ischemia in the deep watershed distribution. There are multiple bilateral peripheral subcortical foci of ischemia. Areas along the left postcentral gyrus and right anterior convexity are suggestive subarachnoid hemorrhage. There is also magnetic susceptibility effect in the left cerebellum, also likely indicating an area of hemorrhage. There is mild edema corresponding to the sites of diffusion abnormality. Mild generalized volume loss. No midline shift. SKULL AND UPPER CERVICAL SPINE: The visualized skull base, calvarium, upper cervical spine and extracranial soft tissues are normal. SINUSES/ORBITS: No fluid levels or advanced mucosal thickening. No mastoid or middle ear effusion. The orbits are normal. MRA HEAD FINDINGS POSTERIOR CIRCULATION: --Basilar artery: Normal. --Posterior cerebral arteries: Normal. The right PCA is partially supplied by the posterior communicating artery. --Superior cerebellar arteries: Normal. --Inferior cerebellar arteries: Normal left AICA. The posterior inferior cerebral arteries are normal. ANTERIOR CIRCULATION: --Intracranial internal carotid arteries: Normal. --Anterior cerebral arteries: Normal. Both A1 segments are present. Patent anterior communicating artery. --Middle cerebral arteries: Moderate narrowing of the M2 segments bilaterally. --Posterior communicating arteries: Present on the right, absent on the left. IMPRESSION: 1. Numerous scattered foci of acute ischemia throughout the cerebrum and cerebellum. Many of these lesions are most suggestive of embolic infarcts, given the widespread distribution over multiple vascular territories. Others are in a deep watershed distribution, as is seen in the setting of hypoperfusion events. 2. Areas of magnetic susceptibility effect superimposed on diffusion abnormality along the left  postcentral gyrus, right frontal convexity and lateral ventricular atria are suggestive subarachnoid blood. Noncontrast head CT recommended for clarification. 3. No midline shift or other mass effect. 4. No proximal intracranial arterial occlusion. Moderate narrowing of the M2 segments of both middle cerebral arteries. Critical Value/emergent results were called by telephone at the time of interpretation on 01/19/2019 at 5:32 pm to Dr. Durward [facility] , who verbally acknowledged these results. Electronically Signed   By: Deatra Robinson M.D.   On: 01/19/2019 17:33   Mr Brain Wo Contrast  Result Date: 01/19/2019 CLINICAL DATA:  Right upper extremity weakness EXAM: MRI HEAD WITHOUT CONTRAST MRA HEAD WITHOUT CONTRAST TECHNIQUE: Multiplanar, multiecho pulse sequences of the brain and surrounding structures were obtained without intravenous contrast. Angiographic images of the head were obtained using MRA technique without contrast. COMPARISON:  Head CT 10/26/2016 FINDINGS: MRI HEAD FINDINGS BRAIN: There is scattered multifocal diffusion restriction throughout the brain. The largest areas of diffusion abnormality are in the cerebellum. There are bilateral areas of ischemia in the deep watershed distribution. There are multiple bilateral peripheral subcortical foci of ischemia. Areas along the left postcentral gyrus and right anterior convexity are suggestive subarachnoid hemorrhage. There is also magnetic susceptibility effect in the left cerebellum, also likely indicating an area of hemorrhage. There is mild edema corresponding to the sites of diffusion abnormality. Mild generalized volume loss. No midline shift. SKULL AND UPPER CERVICAL SPINE: The visualized skull base, calvarium, upper cervical spine and extracranial soft tissues  are normal. SINUSES/ORBITS: No fluid levels or advanced mucosal thickening. No mastoid or middle ear effusion. The orbits are normal. MRA HEAD FINDINGS POSTERIOR CIRCULATION: --Basilar  artery: Normal. --Posterior cerebral arteries: Normal. The right PCA is partially supplied by the posterior communicating artery. --Superior cerebellar arteries: Normal. --Inferior cerebellar arteries: Normal left AICA. The posterior inferior cerebral arteries are normal. ANTERIOR CIRCULATION: --Intracranial internal carotid arteries: Normal. --Anterior cerebral arteries: Normal. Both A1 segments are present. Patent anterior communicating artery. --Middle cerebral arteries: Moderate narrowing of the M2 segments bilaterally. --Posterior communicating arteries: Present on the right, absent on the left. IMPRESSION: 1. Numerous scattered foci of acute ischemia throughout the cerebrum and cerebellum. Many of these lesions are most suggestive of embolic infarcts, given the widespread distribution over multiple vascular territories. Others are in a deep watershed distribution, as is seen in the setting of hypoperfusion events. 2. Areas of magnetic susceptibility effect superimposed on diffusion abnormality along the left postcentral gyrus, right frontal convexity and lateral ventricular atria are suggestive subarachnoid blood. Noncontrast head CT recommended for clarification. 3. No midline shift or other mass effect. 4. No proximal intracranial arterial occlusion. Moderate narrowing of the M2 segments of both middle cerebral arteries. Critical Value/emergent results were called by telephone at the time of interpretation on 01/19/2019 at 5:32 pm to Dr. Durward Mallard [facility] , who verbally acknowledged these results. Electronically Signed   By: Deatra Robinson M.D.   On: 01/19/2019 17:33   Mr Cervical Spine Wo Contrast  Result Date: 01/19/2019 CLINICAL DATA:  Shoulder pain and progressive weakness EXAM: MRI CERVICAL SPINE WITHOUT CONTRAST TECHNIQUE: Multiplanar, multisequence MR imaging of the cervical spine was performed. No intravenous contrast was administered. COMPARISON:  None. FINDINGS: Alignment: Normal. Vertebrae: No  focal marrow lesion. No compression fracture or evidence of discitis osteomyelitis. Cord: Normal caliber and signal. Posterior Fossa, vertebral arteries, paraspinal tissues: Visualized posterior fossa is normal. Vertebral artery flow voids are preserved. No prevertebral effusion. Disc levels: Sagittal imaging includes the atlantoaxial joint to the level of the T2-3 disc space, with axial imaging of the disc spaces from C2-3 to C6-7. Axial images are degraded by motion. Within that limitation, there is no spinal canal stenosis above the C5 level. At C5-6, there is a small central disc protrusion that narrows the ventral thecal sac with mild spinal canal stenosis. At C6-7, there is a intermediate disc bulge causing mild spinal canal stenosis. There is poor visualization of the neural foramina because of motion. IMPRESSION: 1. Motion degraded examination, particularly limiting assessment of the neural foramina. 2. Mild spinal canal stenosis at C5-6 and C6-7. 3. No acute abnormality of the cervical spine. Electronically Signed   By: Deatra Robinson M.D.   On: 01/19/2019 17:45   US Venous Img Upper Uni Right  Result Date: 01/18/2019 CLINICAL DATA:  Right upper extremity edema. History of diabetes. Evaluate DVT. EXAM: RIGHT UPPER EXTREMITY VENOUS DOPPLER ULTRASOUND TECHNIQUE: Gray-scale sonography with graded compression, as well as color Doppler and duplex ultrasound were performed to evaluate the upper extremity deep venous system from the level of the subclavian vein and including the jugular, axillary, basilic, radial, ulnar and upper cephalic vein. Spectral Doppler was utilized to evaluate flow at rest and with distal augmentation maneuvers. COMPARISON:  None. FINDINGS: Contralateral Subclavian Vein: Respiratory phasicity is normal and symmetric with the symptomatic side. No evidence of thrombus. Normal compressibility. Internal Jugular Vein: No evidence of thrombus. Normal compressibility, respiratory phasicity  and response to augmentation. Subclavian Vein: No evidence of thrombus. Normal  compressibility, respiratory phasicity and response to augmentation. Axillary Vein: No evidence of thrombus. Normal compressibility, respiratory phasicity and response to augmentation. Cephalic Vein: No evidence of thrombus. Normal compressibility, respiratory phasicity and response to augmentation. Basilic Vein: No evidence of thrombus. Normal compressibility, respiratory phasicity and response to augmentation. Brachial Veins: No evidence of thrombus. Normal compressibility, respiratory phasicity and response to augmentation. Radial Veins: No evidence of thrombus. Normal compressibility, respiratory phasicity and response to augmentation. Ulnar Veins: No evidence of thrombus. Normal compressibility, respiratory phasicity and response to augmentation. Venous Reflux:  None visualized. Other Findings:  None visualized. IMPRESSION: No evidence of DVT within the right upper extremity. Electronically Signed   By: Simonne Come M.D.   On: 01/18/2019 15:10   [place] Ekg Site Rite  Result Date: 01/18/2019 If [facility] image not attached, placement could not be confirmed due to current cardiac rhythm.  Scheduled Meds: .  stroke: mapping our early stages of recovery book   Does not apply Once  . aspirin  325 mg Oral Daily  . atorvastatin  80 mg Oral q1800  . diltiazem  120 mg Oral Daily  . sodium chloride flush  10-40 mL Intracatheter Q12H  . spironolactone  12.5 mg Oral Daily   Continuous Infusions: .  ceFAZolin (ANCEF) IV 2 g (01/20/19 0458)   Active Problems:   Obstructive sleep apnea   Chronic atrial fibrillation   HTN (hypertension)   Hyperlipemia   Status post gastric bypass for obesity   Vitamin D deficiency   History of Severe aortic stenosis   Varicose veins of right lower extremity with complications   Pulmonary hypertension, unspecified (HCC)   History of subdural hematoma   S/P TAVR (transcatheter aortic valve  replacement)   Sepsis (HCC)   Acute renal failure (HCC)   Right shoulder pain   Acute CVA (cerebrovascular accident) (HCC)   Huey Bienenstock, MD [facility] 01/20/2019, 12:06 PM    LOS: 7 days

## 2019-01-21 ENCOUNTER — Encounter (HOSPITAL_COMMUNITY): Payer: Self-pay | Admitting: Gastroenterology

## 2019-01-21 ENCOUNTER — Encounter (HOSPITAL_COMMUNITY)
Admission: EM | Disposition: A | Discharge status: Rehabilitation Facility | Payer: Self-pay | Source: Home / Self Care | Attending: Internal Medicine

## 2019-01-21 ENCOUNTER — Inpatient Hospital Stay (HOSPITAL_COMMUNITY): Payer: Medicare Other

## 2019-01-21 DIAGNOSIS — J690 Pneumonitis due to inhalation of food and vomit: Secondary | ICD-10-CM

## 2019-01-21 DIAGNOSIS — K922 Gastrointestinal hemorrhage, unspecified: Secondary | ICD-10-CM

## 2019-01-21 HISTORY — PX: ESOPHAGOGASTRODUODENOSCOPY: SHX5428

## 2019-01-21 LAB — LIPID PANEL
Cholesterol: 94 mg/dL (ref 0–200)
HDL: 11 mg/dL — ABNORMAL LOW (ref >40)
LDL Cholesterol: 70 mg/dL (ref 0–99)
Total CHOL/HDL Ratio: 8.5 RATIO
Triglycerides: 67 mg/dL (ref <150)
VLDL: 13 mg/dL (ref 0–40)

## 2019-01-21 LAB — CBC
HCT: 22 % — ABNORMAL LOW (ref 39.0–52.0)
HCT: 20.9 % — ABNORMAL LOW (ref 39.0–52.0)
HEMOGLOBIN: 7.7 g/dL — AB (ref 13.0–17.0)
Hemoglobin: 7.5 g/dL — ABNORMAL LOW (ref 13.0–17.0)
MCH: 23.8 pg — ABNORMAL LOW (ref 26.0–34.0)
MCH: 25.4 pg — ABNORMAL LOW (ref 26.0–34.0)
MCHC: 35 g/dL (ref 30.0–36.0)
MCHC: 35.9 g/dL (ref 30.0–36.0)
MCV: 68.1 fL — ABNORMAL LOW (ref 80.0–100.0)
MCV: 70.8 fL — ABNORMAL LOW (ref 80.0–100.0)
Platelets: 260 10*3/uL (ref 150–400)
Platelets: 234 10*3/uL (ref 150–400)
RBC: 3.23 MIL/uL — ABNORMAL LOW (ref 4.22–5.81)
RBC: 2.95 MIL/uL — ABNORMAL LOW (ref 4.22–5.81)
RDW: 15.2 % (ref 11.5–15.5)
RDW: 17.7 % — ABNORMAL HIGH (ref 11.5–15.5)
WBC: 26.7 10*3/uL — ABNORMAL HIGH (ref 4.0–10.5)
WBC: 25.1 10*3/uL — ABNORMAL HIGH (ref 4.0–10.5)
nRBC: 0 % (ref 0.0–0.2)
nRBC: 0.1 % (ref 0.0–0.2)

## 2019-01-21 LAB — BASIC METABOLIC PANEL
Anion gap: 5 (ref 5–15)
BUN: 57 mg/dL — ABNORMAL HIGH (ref 8–23)
CO2: 18 mmol/L — ABNORMAL LOW (ref 22–32)
Calcium: 7.8 mg/dL — ABNORMAL LOW (ref 8.9–10.3)
Chloride: 113 mmol/L — ABNORMAL HIGH (ref 98–111)
Creatinine, Ser: 1.77 mg/dL — ABNORMAL HIGH (ref 0.61–1.24)
GFR calc Af Amer: 45 mL/min — ABNORMAL LOW (ref >60)
GFR calc non Af Amer: 39 mL/min — ABNORMAL LOW (ref >60)
Glucose, Bld: 154 mg/dL — ABNORMAL HIGH (ref 70–99)
Potassium: 5.4 mmol/L — ABNORMAL HIGH (ref 3.5–5.1)
Sodium: 136 mmol/L (ref 135–145)

## 2019-01-21 LAB — PREPARE RBC (CROSSMATCH)

## 2019-01-21 LAB — GLUCOSE, CAPILLARY: Glucose-Capillary: 130 mg/dL — ABNORMAL HIGH (ref 70–99)

## 2019-01-21 SURGERY — EGD (ESOPHAGOGASTRODUODENOSCOPY)
Anesthesia: Moderate Sedation

## 2019-01-21 MED ORDER — DIPHENHYDRAMINE HCL 50 MG/ML IJ SOLN
INTRAMUSCULAR | Status: AC
  Filled 2019-01-21: qty 1

## 2019-01-21 MED ORDER — MIDAZOLAM HCL (PF) 5 MG/ML IJ SOLN
INTRAMUSCULAR | Status: AC
  Filled 2019-01-21: qty 3

## 2019-01-21 MED ORDER — SODIUM CHLORIDE 0.9 % IV SOLN
8.0000 mg/h | INTRAVENOUS | Status: AC
  Administered 2019-01-21 – 2019-01-24 (×7): 8 mg/h via INTRAVENOUS
  Filled 2019-01-21 (×11): qty 80

## 2019-01-21 MED ORDER — PANTOPRAZOLE SODIUM 40 MG IV SOLR
40.0000 mg | Freq: Two times a day (BID) | INTRAVENOUS | Status: DC
  Administered 2019-01-24 – 2019-01-29 (×11): 40 mg via INTRAVENOUS
  Filled 2019-01-21 (×12): qty 40

## 2019-01-21 MED ORDER — SODIUM CHLORIDE 0.9% IV SOLUTION: Freq: Once | INTRAVENOUS | Status: DC

## 2019-01-21 MED ORDER — BUTAMBEN-TETRACAINE-BENZOCAINE 2-2-14 % EX AERO
INHALATION_SPRAY | CUTANEOUS | Status: DC | PRN
  Administered 2019-01-21: 2 via TOPICAL

## 2019-01-21 MED ORDER — MIDAZOLAM HCL (PF) 10 MG/2ML IJ SOLN
INTRAMUSCULAR | Status: DC | PRN
  Administered 2019-01-21 (×2): 2 mg via INTRAVENOUS

## 2019-01-21 MED ORDER — FENTANYL CITRATE (PF) 100 MCG/2ML IJ SOLN
INTRAMUSCULAR | Status: AC
  Filled 2019-01-21: qty 4

## 2019-01-21 MED ORDER — EPINEPHRINE PF 1 MG/10ML IJ SOSY
PREFILLED_SYRINGE | INTRAMUSCULAR | Status: AC
  Filled 2019-01-21: qty 10

## 2019-01-21 MED ORDER — SODIUM CHLORIDE 0.9 % IV SOLN: INTRAVENOUS | Status: DC

## 2019-01-21 MED ORDER — PIPERACILLIN-TAZOBACTAM 3.375 G IVPB
3.3750 g | Freq: Three times a day (TID) | INTRAVENOUS | Status: DC
  Administered 2019-01-21 – 2019-01-27 (×18): 3.375 g via INTRAVENOUS
  Filled 2019-01-21 (×21): qty 50

## 2019-01-21 MED ORDER — FENTANYL CITRATE (PF) 100 MCG/2ML IJ SOLN
INTRAMUSCULAR | Status: DC | PRN
  Administered 2019-01-21 (×2): 25 ug via INTRAVENOUS

## 2019-01-21 MED ORDER — GLUCAGON HCL RDNA (DIAGNOSTIC) 1 MG IJ SOLR
INTRAMUSCULAR | Status: AC
  Filled 2019-01-21: qty 1

## 2019-01-21 MED ORDER — SODIUM CHLORIDE 0.9 % IV SOLN
80.0000 mg | Freq: Once | INTRAVENOUS | Status: AC
  Administered 2019-01-21: 80 mg via INTRAVENOUS
  Filled 2019-01-21: qty 80

## 2019-01-21 MED ORDER — SPOT INK MARKER SYRINGE KIT: PACK | SUBMUCOSAL | Status: AC

## 2019-01-21 MED ORDER — SODIUM CHLORIDE 0.9 % IV SOLN
INTRAVENOUS | Status: AC | PRN
  Administered 2019-01-21: 500 mL via INTRAVENOUS

## 2019-01-21 MED ORDER — SODIUM POLYSTYRENE SULFONATE 15 GM/60ML PO SUSP
45.0000 g | Freq: Once | ORAL | Status: AC
  Administered 2019-01-21: 45 g via ORAL
  Filled 2019-01-21: qty 180

## 2019-01-21 NOTE — Progress Notes (Signed)
Pharmacy Antibiotic Note  Joel Rogers is a 68 y.o. male admitted on 01/13/2019 with aspiration PNA.  Pharmacy has been consulted for ampicillin/sulbactam dosing. D/W MD, will change to zosyn for now due to short term supply issue. WBC 17>>26.    Plan: Zosyn 3.375GM IV q8h (4 hour infusion) Would deescalate once supply issue is resolved.    Height: 6\' 1"  (185.4 cm) Weight: 282 lb 6.6 oz (128.1 kg) IBW/kg (Calculated) : 79.9  Temp (24hrs), Avg:98.4 F (36.9 C), Min:97.6 F (36.4 C), Max:98.9 F (37.2 C)  Recent Labs  Lab 01/16/19 0427 01/17/19 0418 01/18/19 0424 01/19/19 0526 01/21/19 0538  WBC 26.2* 24.2* 20.9* 17.3* 26.7*  CREATININE 1.50* 1.45* 1.68* 1.60* 1.77*    Estimated Creatinine Clearance: 56 mL/min (A) (by C-G formula based on SCr of 1.77 mg/dL (H)).    No Known Allergies  Nakeyia Menden A. Jeanella Craze, PharmD, BCPS Clinical Pharmacist [place] Pager: [phone] Please utilize Amion for appropriate phone number to reach the unit pharmacist [facility])     01/21/2019 12:20 PM

## 2019-01-21 NOTE — Progress Notes (Signed)
PROGRESS NOTE  Joel Rogers  HUD:[id number]  DOB: 1951-11-09  DOA: 01/13/2019 PCP: [name], MD  Brief Admission Hx: 68 y/o male admitted to the hospital with sepsis and streptococcal bacteremia secondary to right lower extremity cellulitis.  He has been treated with intravenous antibiotics overall sepsis is improved.  Since he has a history of bioprosthetic aortic valve, TEE was performed that did not show any evidence of vegetations.  Since admission, has had persistent right-sided weakness.  His limited range of motion was initially felt to be related to a fall that he had prior to admission where pain was limiting his motion.  Since his weakness persisted, he underwent MRI brain that showed bilateral infarcts.  Transfered  to [facility] on 01/20/2019 for further work-up and neurology evaluation, the 12/11/2018, patient started to have coffee-ground emesis, hemoglobin of 7.7, work-up significant for GI bleed.  Subjective: Patient reports coffee-ground emesis multiple times yesterday, otherwise denies any new deficits, fever or chills  MDM/Assessment & Plan:    Severe sepsis - Present on admission, resolved now, she is secondary to Streptococcus bacteremia and cellulitis  - His blood cultures positive for group C strep.  Being treated with Ancef, currently on Zosyn for aspiration pneumonia  Streptococcus bacteremia - source is secondary to right lower extremity cellulitis.  He did complain of some pain in his right shoulder, but this joint does not appear to be erythematous, tender, swollen.  No evidence of joint effusion.  Patient had 1 out of 2 positive blood cultures for group C streptococcus.  Repeat blood cultures on 2/24 have not shown any growth.  2D echocardiogram did not show any evidence of vegetations.  Since patient has bioprosthetic aortic valve, TEE performed on 2/27 did not show any evidence of vegetations.  PICC line has been placed and he will complete a 2-week  course of antibiotics.  Acute CVA. -Patient with right-sided weakness, x-rays of right shoulder, right hip were unrevealing, seen to have right-sided weakness during hospital stay, MRI of the brain was obtained, which did show bilateral infarct. -Neurology input greatly appreciated, was felt to be secondary to embolic stroke, most likely holding Eliquis due to thrombocytopenia contributed to it, discussed with neurology, should still hold on resuming Eliquis, as well I had to stop aspirin today given his significant GI bleed. -Await further neurology recommendation when it is appropriate to resume Eliquis(visually there is some petechial hemorrhage in his stroke), and of course we will have to wait for GI evaluation and clearance before resuming full anticoagulation. -MRA head was unremarkable, carotid Doppler still pending, 2D echo with a preserved EF, with no embolic source could be identified.  Aspiration pneumonia -Patient with coffee-ground emesis overnight, had worsening leukocytosis at 26K today, chest x-ray significant for left basal opacity, suspicious for aspiration pneumonia, I will change his Ancef to Zosyn for aspiration pneumonia coverage.  Acute blood loss anemia -In the setting of upper GI bleed, I will transfuse 2 units PRBC today, given his acute CVA, and I want to avoid hypotension .  Upper GI bleed -Patient with coffee-ground emesis overnight, went for endoscopy today by GI, with evidence of blood in the stomach with poor visualization, plan to repeat endoscopy tomorrow, continue with Protonix drip, monitor H&H closely and transfuse as needed, keep n.p.o., and hold all forms of anticoagulation.  AKI on CKD stage III - Baseline creatinine is 1.2, creatinine peaked at 1.77 today  Cellulitis of RLE  - due to streptococcus infection, clinically looking  better, [place] RLE negative for DVT.   Chronic atrial fibrillation - He is on rate control with Cardizem.  He has been chronically  taking Eliquis.  This was held on admission due to severe thrombocytopenia.  Currently Eliquis on hold in the setting of acute CVA with him conversion, and upper GI bleed  Thrombocytopenia - patient was initially severely thrombocytopenic, felt secondary to sepsis.  Since being treated with antibiotics and sepsis improving, overall platelet count has been improving.  Status post TAVR for severe aortic stenosis -he is followed by Dr. Allyson Sabal.  He had been doing fairly well since surgery.    Acute on chronic right shoulder pain - plain film x-rays only showed arthritic changes in the right shoulder.  Joint does not appear to be inflamed, tender or swollen.  Essential hypertension - blood pressures currently stable and controlled.  Follow.  Hyperbilirubinemia.  - Trending down with IV fluids since admission.  Right upper quadrant ultrasound is unremarkable.  Right arm swelling.  -  Venous Dopplers negative for DVT  DVT prophylaxis: SCDs,  Code Status: Full Family Communication: None at bedside Disposition Plan: Pending further work-up  Consultants:  Cardiology for TEE  Procedures: Echo: 1. The left ventricle has normal systolic function with an ejection fraction of 60-65%. The cavity size was normal. There is mildly increased left ventricular wall thickness. Left ventricular diastolic Doppler parameters are indeterminate. No evidence  of left ventricular regional wall motion abnormalities.  2. The right ventricle has moderately reduced systolic function. The cavity was mildly enlarged. There is no increase in right ventricular wall thickness. Right ventricular systolic pressure is moderately elevated with an estimated pressure of 41.7  mmHg.  3. Left atrial size was severely dilated.  4. Right atrial size was severely dilated.  5. The mitral valve is normal in structure. There is mild mitral annular calcification present.  6. The tricuspid valve is normal in structure.  7. A 26  Edwards Sapien bioprosthetic aortic valve (TAVR) valve is present in the aortic position. Normal function observed with mean gradient 15.7 mmHg. No perivalvular leak.  8. The pulmonic valve was grossly normal. Pulmonic valve regurgitation is mild by color flow Doppler.  9. The aortic root is normal in size and structure. 10. The inferior vena cava was dilated in size with <50% respiratory variability.  11. No evidence of left ventricular regional wall motion abnormalities.  Antimicrobials:  Cefepime 2/22-2/24  Vancomycin 2/22 >> 2/25  Metronidazole 2/22 >2/13  Ancef 2/25 >    Objective: Vitals:   01/21/19 1255 01/21/19 1300 01/21/19 1305 01/21/19 1318  BP: (!) 166/60 (!) 158/67 (!) 143/78 (!) 128/58  Pulse: 99 (!) 115 (!) 115   Resp: (!) 21 (!) 21 20 (!) 21  Temp:      TempSrc:      SpO2: 100% 100% 100% 100%  Weight:      Height:        Intake/Output Summary (Last 24 hours) at 01/21/2019 1331 Last data filed at 01/21/2019 1200 Gross per 24 hour  Intake 300 ml  Output 3300 ml  Net -3000 ml   Filed Weights   01/13/19 1356 01/19/19 2106 01/20/19 0423  Weight: 110.7 kg 123.3 kg 128.1 kg   REVIEW OF SYSTEMS  As per history otherwise all reviewed and reported negative  Exam:  Awake Alert, Oriented X 3, No new F.N deficits, Normal affect, with some dried blood around his mouth Symmetrical Chest wall movement, Good air movement bilaterally, CTAB RRR,No  Gallops,Rubs or new Murmurs, No Parasternal Heave +ve B.Sounds, Abd Soft, No tenderness, No rebound - guarding or rigidity. No Cyanosis, Clubbing or edema, Patient with right-sided weakness   Basic Metabolic Panel: Recent Labs  Lab 01/15/19 0415 01/16/19 0427 01/17/19 0418 01/18/19 0424 01/19/19 0526 01/21/19 0538  NA 139 139 138 136 135 136  K 4.2 4.7 4.9 5.1 5.0 5.4*  CL 112* 112* 112* 112* 110 113*  CO2 19* 19* 19* 18* 19* 18*  GLUCOSE 99 92 100* 95 106* 154*  BUN 46* 53* 51* 51* 51* 57*  CREATININE 1.40*  1.50* 1.45* 1.68* 1.60* 1.77*  CALCIUM 8.1* 8.2* 8.2* 8.1* 8.0* 7.8*  MG 2.8* 3.0*  --   --   --   --    Liver Function Tests: Recent Labs  Lab 01/15/19 0415 01/16/19 0427 01/17/19 0418 01/18/19 0424 01/19/19 0526  AST 43* 38 30 26 26   ALT 29 28 24 15 7   ALKPHOS 90 96 105 105 102  BILITOT 2.0* 2.3* 1.6* 1.4* 1.3*  PROT 5.6* 6.2* 6.0* 6.1* 6.3*  ALBUMIN 2.3* 2.4* 2.3* 2.1* 2.0*   No results for input(s): LIPASE, AMYLASE in the last 168 hours. No results for input(s): AMMONIA in the last 168 hours. CBC: Recent Labs  Lab 01/15/19 0415 01/16/19 0427 01/17/19 0418 01/18/19 0424 01/19/19 0526 01/21/19 0538  WBC 31.0* 26.2* 24.2* 20.9* 17.3* 26.7*  NEUTROABS 27.1* 22.4* 19.4* 15.4* 12.5*  --   HGB 11.6* 11.5* 11.0* 10.4* 10.0* 7.7*  HCT 34.0* 33.8* 31.7* 30.4* 29.8* 22.0*  MCV 70.0* 68.6* 69.7* 68.0* 70.3* 68.1*  PLT 62* 78* 105* 153 224 260   Cardiac Enzymes: No results for input(s): CKTOTAL, CKMB, CKMBINDEX, TROPONINI in the last 168 hours. CBG (last 3)  No results for input(s): GLUCAP in the last 72 hours. Recent Results (from the past 240 hour(s))  Culture, blood (routine x 2)     Status: None   Collection Time: 01/13/19  3:51 PM  Result Value Ref Range Status   Specimen Description BLOOD LEFT ARM  Final   Special Requests   Final    BOTTLES DRAWN AEROBIC ONLY Blood Culture adequate volume   Culture   Final    NO GROWTH 5 DAYS Performed at [facility], [address], [place], [name] [address]    Report Status 01/18/2019 FINAL  Final  Urine culture     Status: Abnormal   Collection Time: 01/13/19  5:40 PM  Result Value Ref Range Status   Specimen Description   Final    URINE, CATHETERIZED Performed at [facility], [address] [place], [name] [address]    Special Requests   Final    NONE Performed at [facility], [address], [place], [name] [address]    Culture (A)  Final    <10,000 COLONIES/mL INSIGNIFICANT GROWTH Performed at [facility], [address] [place], [name] [address]    Report Status 01/15/2019 FINAL  Final  Culture, blood (routine x 2)     Status: Abnormal   Collection Time: 01/13/19  7:47 PM  Result Value Ref Range Status   Specimen Description   Final    BLOOD LEFT ARM Performed at [facility], [address], [place], [name] [address]    Special Requests   Final    BOTTLES DRAWN AEROBIC AND ANAEROBIC Blood Culture adequate volume Performed at [facility], [address], [place], [name] [address]    Culture  Setup Time  Final    GRAM POSITIVE COCCI IN CHAINS Gram Stain Report Called to,Read Back By and Verified With: BOUDERANT,R @ 0516 ON 01/14/19 BY JUW GS DONE @ [facility] CRITICAL RESULT CALLED TO, READ BACK BY AND VERIFIED WITH: S. HALL, PHARMD ([facility]) AT 1010 ON 01/14/19 BY C. JESSUP, MLT. Performed at [facility], [address], [place], [name] [address]    Culture STREPTOCOCCUS GROUP C (A)  Final   Report Status 01/16/2019 FINAL  Final   Organism ID, Bacteria STREPTOCOCCUS GROUP C  Final      Susceptibility   Streptococcus group c - MIC*    CLINDAMYCIN <=0.25 SENSITIVE Sensitive     AMPICILLIN <=0.25 SENSITIVE Sensitive     ERYTHROMYCIN <=0.12 SENSITIVE Sensitive     VANCOMYCIN <=0.12 SENSITIVE Sensitive     CEFTRIAXONE <=0.12 SENSITIVE Sensitive     LEVOFLOXACIN 0.5 SENSITIVE Sensitive     PENICILLIN Value in next row Sensitive      SENSITIVE<=0.06    * STREPTOCOCCUS GROUP C  Blood Culture ID Panel (Reflexed)     Status: Abnormal   Collection Time: 01/13/19  7:47 PM  Result Value Ref Range Status   Enterococcus species NOT DETECTED NOT DETECTED Final   Listeria monocytogenes NOT DETECTED NOT DETECTED Final   Staphylococcus species NOT DETECTED NOT DETECTED Final   Staphylococcus aureus (BCID) NOT DETECTED NOT DETECTED Final   Streptococcus species DETECTED (A) NOT DETECTED Final    Comment: Not Enterococcus species, Streptococcus agalactiae, Streptococcus pyogenes, or  Streptococcus pneumoniae. CRITICAL RESULT CALLED TO, READ BACK BY AND VERIFIED WITH: S. HALL, PHARMD ([facility]) AT 1010 ON 01/14/19 BY C. JESSUP, MLT.    Streptococcus agalactiae NOT DETECTED NOT DETECTED Final   Streptococcus pneumoniae NOT DETECTED NOT DETECTED Final   Streptococcus pyogenes NOT DETECTED NOT DETECTED Final   Acinetobacter baumannii NOT DETECTED NOT DETECTED Final   Enterobacteriaceae species NOT DETECTED NOT DETECTED Final   Enterobacter cloacae complex NOT DETECTED NOT DETECTED Final   Escherichia coli NOT DETECTED NOT DETECTED Final   Klebsiella oxytoca NOT DETECTED NOT DETECTED Final   Klebsiella pneumoniae NOT DETECTED NOT DETECTED Final   Proteus species NOT DETECTED NOT DETECTED Final   Serratia marcescens NOT DETECTED NOT DETECTED Final   Haemophilus influenzae NOT DETECTED NOT DETECTED Final   Neisseria meningitidis NOT DETECTED NOT DETECTED Final   Pseudomonas aeruginosa NOT DETECTED NOT DETECTED Final   Candida albicans NOT DETECTED NOT DETECTED Final   Candida glabrata NOT DETECTED NOT DETECTED Final   Candida krusei NOT DETECTED NOT DETECTED Final   Candida parapsilosis NOT DETECTED NOT DETECTED Final   Candida tropicalis NOT DETECTED NOT DETECTED Final    Comment: Performed at [facility], [address], [place], [name] [address]  Culture, blood (Routine X 2) w Reflex to ID Panel     Status: None   Collection Time: 01/15/19  8:52 AM  Result Value Ref Range Status   Specimen Description BLOOD LEFT FOREARM  Final   Special Requests   Final    BOTTLES DRAWN AEROBIC AND ANAEROBIC Blood Culture adequate volume   Culture   Final    NO GROWTH 5 DAYS Performed at [facility], [address], [place], [name] [address]    Report Status 01/20/2019 FINAL  Final  Culture, blood (Routine X 2) w Reflex to ID Panel     Status: None   Collection Time: 01/15/19  9:05 AM  Result Value Ref Range Status  Specimen Description BLOOD LEFT HAND  Final   Special  Requests   Final    BOTTLES DRAWN AEROBIC AND ANAEROBIC Blood Culture results may not be optimal due to an excessive volume of blood received in culture bottles   Culture   Final    NO GROWTH 5 DAYS Performed at [facility], [address], [place], [name] [address]    Report Status 01/20/2019 FINAL  Final     Studies: Ct Head Wo Contrast  Result Date: 01/20/2019 CLINICAL DATA:  68 y/o M; subarachnoid hemorrhage and stroke for follow-up. EXAM: CT HEAD WITHOUT CONTRAST TECHNIQUE: Contiguous axial images were obtained from the base of the skull through the vertex without intravenous contrast. COMPARISON:  01/19/2019 MRI of the head. FINDINGS: Brain: Multiple foci of hypoattenuation are present within the cerebellum and scattered over the cerebral convexities and within periventricular white matter corresponding to areas of infarction on the prior MRI of the brain. Findings are stable given differences in technique. Stable small volume of subarachnoid hemorrhage within the left central sulcus. No new stroke, hemorrhage, mass effect, intracranial hemorrhage, or herniation identified. Vascular: Calcific atherosclerosis of carotid siphons. No hyperdense vessel identified. Skull: Negative for fracture or focal lesion. Stable right frontal region craniotomy chronic postsurgical changes. Sinuses/Orbits: Mucosal thickening of the left frontal sinus antrum and anterior ethmoid air cells. Additional visible paranasal sinuses and the mastoid air cells are normally aerated. Orbits are unremarkable. Other: None. IMPRESSION: 1. Multiple foci of infarction are present scattered throughout the supratentorial cortex, periventricular white matter, and throughout the cerebellum. The distribution of infarction is stable from prior MRI given differences in technique. 2. Stable small focus of acute subarachnoid hemorrhage within the left central sulcus. Additional areas of hemorrhage suggested on MRI are not visible on CT,  possibly chronic hemosiderin deposition. 3. No new acute intracranial abnormality identified. Electronically Signed   By: Mitzi Hansen M.D.   On: 01/20/2019 18:40   Mr Maxine Glenn Head Wo Contrast  Result Date: 01/19/2019 CLINICAL DATA:  Right upper extremity weakness EXAM: MRI HEAD WITHOUT CONTRAST MRA HEAD WITHOUT CONTRAST TECHNIQUE: Multiplanar, multiecho pulse sequences of the brain and surrounding structures were obtained without intravenous contrast. Angiographic images of the head were obtained using MRA technique without contrast. COMPARISON:  Head CT 10/26/2016 FINDINGS: MRI HEAD FINDINGS BRAIN: There is scattered multifocal diffusion restriction throughout the brain. The largest areas of diffusion abnormality are in the cerebellum. There are bilateral areas of ischemia in the deep watershed distribution. There are multiple bilateral peripheral subcortical foci of ischemia. Areas along the left postcentral gyrus and right anterior convexity are suggestive subarachnoid hemorrhage. There is also magnetic susceptibility effect in the left cerebellum, also likely indicating an area of hemorrhage. There is mild edema corresponding to the sites of diffusion abnormality. Mild generalized volume loss. No midline shift. SKULL AND UPPER CERVICAL SPINE: The visualized skull base, calvarium, upper cervical spine and extracranial soft tissues are normal. SINUSES/ORBITS: No fluid levels or advanced mucosal thickening. No mastoid or middle ear effusion. The orbits are normal. MRA HEAD FINDINGS POSTERIOR CIRCULATION: --Basilar artery: Normal. --Posterior cerebral arteries: Normal. The right PCA is partially supplied by the posterior communicating artery. --Superior cerebellar arteries: Normal. --Inferior cerebellar arteries: Normal left AICA. The posterior inferior cerebral arteries are normal. ANTERIOR CIRCULATION: --Intracranial internal carotid arteries: Normal. --Anterior cerebral arteries: Normal. Both A1  segments are present. Patent anterior communicating artery. --Middle cerebral arteries: Moderate narrowing of the M2 segments bilaterally. --Posterior communicating arteries: Present on the right,  absent on the left. IMPRESSION: 1. Numerous scattered foci of acute ischemia throughout the cerebrum and cerebellum. Many of these lesions are most suggestive of embolic infarcts, given the widespread distribution over multiple vascular territories. Others are in a deep watershed distribution, as is seen in the setting of hypoperfusion events. 2. Areas of magnetic susceptibility effect superimposed on diffusion abnormality along the left postcentral gyrus, right frontal convexity and lateral ventricular atria are suggestive subarachnoid blood. Noncontrast head CT recommended for clarification. 3. No midline shift or other mass effect. 4. No proximal intracranial arterial occlusion. Moderate narrowing of the M2 segments of both middle cerebral arteries. Critical Value/emergent results were called by telephone at the time of interpretation on 01/19/2019 at 5:32 pm to Dr. Durward MallardJEHANZEB [facility] , who verbally acknowledged these results. Electronically Signed   By: Deatra RobinsonKevin  Herman M.D.   On: 01/19/2019 17:33   Mr Brain Wo Contrast  Result Date: 01/19/2019 CLINICAL DATA:  Right upper extremity weakness EXAM: MRI HEAD WITHOUT CONTRAST MRA HEAD WITHOUT CONTRAST TECHNIQUE: Multiplanar, multiecho pulse sequences of the brain and surrounding structures were obtained without intravenous contrast. Angiographic images of the head were obtained using MRA technique without contrast. COMPARISON:  Head CT 10/26/2016 FINDINGS: MRI HEAD FINDINGS BRAIN: There is scattered multifocal diffusion restriction throughout the brain. The largest areas of diffusion abnormality are in the cerebellum. There are bilateral areas of ischemia in the deep watershed distribution. There are multiple bilateral peripheral subcortical foci of ischemia. Areas along the  left postcentral gyrus and right anterior convexity are suggestive subarachnoid hemorrhage. There is also magnetic susceptibility effect in the left cerebellum, also likely indicating an area of hemorrhage. There is mild edema corresponding to the sites of diffusion abnormality. Mild generalized volume loss. No midline shift. SKULL AND UPPER CERVICAL SPINE: The visualized skull base, calvarium, upper cervical spine and extracranial soft tissues are normal. SINUSES/ORBITS: No fluid levels or advanced mucosal thickening. No mastoid or middle ear effusion. The orbits are normal. MRA HEAD FINDINGS POSTERIOR CIRCULATION: --Basilar artery: Normal. --Posterior cerebral arteries: Normal. The right PCA is partially supplied by the posterior communicating artery. --Superior cerebellar arteries: Normal. --Inferior cerebellar arteries: Normal left AICA. The posterior inferior cerebral arteries are normal. ANTERIOR CIRCULATION: --Intracranial internal carotid arteries: Normal. --Anterior cerebral arteries: Normal. Both A1 segments are present. Patent anterior communicating artery. --Middle cerebral arteries: Moderate narrowing of the M2 segments bilaterally. --Posterior communicating arteries: Present on the right, absent on the left. IMPRESSION: 1. Numerous scattered foci of acute ischemia throughout the cerebrum and cerebellum. Many of these lesions are most suggestive of embolic infarcts, given the widespread distribution over multiple vascular territories. Others are in a deep watershed distribution, as is seen in the setting of hypoperfusion events. 2. Areas of magnetic susceptibility effect superimposed on diffusion abnormality along the left postcentral gyrus, right frontal convexity and lateral ventricular atria are suggestive subarachnoid blood. Noncontrast head CT recommended for clarification. 3. No midline shift or other mass effect. 4. No proximal intracranial arterial occlusion. Moderate narrowing of the M2  segments of both middle cerebral arteries. Critical Value/emergent results were called by telephone at the time of interpretation on 01/19/2019 at 5:32 pm to Dr. Durward [facility]ardJEHANZEB [facility] , who verbally acknowledged these results. Electronically Signed   By: Deatra RobinsonKevin  Herman M.D.   On: 01/19/2019 17:33   Mr Cervical Spine Wo Contrast  Result Date: 01/19/2019 CLINICAL DATA:  Shoulder pain and progressive weakness EXAM: MRI CERVICAL SPINE WITHOUT CONTRAST TECHNIQUE: Multiplanar, multisequence MR imaging of the cervical spine  was performed. No intravenous contrast was administered. COMPARISON:  None. FINDINGS: Alignment: Normal. Vertebrae: No focal marrow lesion. No compression fracture or evidence of discitis osteomyelitis. Cord: Normal caliber and signal. Posterior Fossa, vertebral arteries, paraspinal tissues: Visualized posterior fossa is normal. Vertebral artery flow voids are preserved. No prevertebral effusion. Disc levels: Sagittal imaging includes the atlantoaxial joint to the level of the T2-3 disc space, with axial imaging of the disc spaces from C2-3 to C6-7. Axial images are degraded by motion. Within that limitation, there is no spinal canal stenosis above the C5 level. At C5-6, there is a small central disc protrusion that narrows the ventral thecal sac with mild spinal canal stenosis. At C6-7, there is a intermediate disc bulge causing mild spinal canal stenosis. There is poor visualization of the neural foramina because of motion. IMPRESSION: 1. Motion degraded examination, particularly limiting assessment of the neural foramina. 2. Mild spinal canal stenosis at C5-6 and C6-7. 3. No acute abnormality of the cervical spine. Electronically Signed   By: Deatra Robinson M.D.   On: 01/19/2019 17:45   Dg Chest Port 1 View  Result Date: 01/21/2019 CLINICAL DATA:  Cough and leukocytosis. EXAM: PORTABLE CHEST 1 VIEW COMPARISON:  01/15/2019 FINDINGS: Marked cardiomegaly and aortic valve replacement again noted. Pulmonary  vascular congestion identified. LEFT basilar opacity noted. No pleural effusion or pneumothorax. A LEFT PICC line is again identified with tip overlying the LOWER SVC. IMPRESSION: New LEFT basilar opacity, question atelectasis versus airspace disease/pneumonia. Marked cardiomegaly with pulmonary vascular congestion. Electronically Signed   By: Harmon Pier M.D.   On: 01/21/2019 11:37   Scheduled Meds: . [MAR Hold]  stroke: mapping our early stages of recovery book   Does not apply Once  . [MAR Hold] sodium chloride   Intravenous Once  . sodium chloride   Intravenous Once  . [MAR Hold] atorvastatin  80 mg Oral q1800  . [MAR Hold] diltiazem  120 mg Oral Daily  . [MAR Hold] pantoprazole  40 mg Intravenous Q12H  . [MAR Hold] sodium chloride flush  10-40 mL Intracatheter Q12H   Continuous Infusions: . sodium chloride    . pantoprozole (PROTONIX) infusion 8 mg/hr (01/21/19 0911)  . [MAR Hold] piperacillin-tazobactam (ZOSYN)  IV     Active Problems:   Obstructive sleep apnea   Chronic atrial fibrillation   HTN (hypertension)   Hyperlipemia   Status post gastric bypass for obesity   Vitamin D deficiency   History of Severe aortic stenosis   Varicose veins of right lower extremity with complications   Pulmonary hypertension, unspecified (HCC)   History of subdural hematoma   S/P TAVR (transcatheter aortic valve replacement)   Sepsis (HCC)   Acute renal failure (HCC)   Right shoulder pain   Acute CVA (cerebrovascular accident) (HCC)   Huey Bienenstock, MD [facility] 01/21/2019, 1:31 PM    LOS: 8 days

## 2019-01-21 NOTE — Progress Notes (Signed)
SLP Cancellation Note  Patient Details Name: Joel Rogers MRN: [id number] DOB: 12/08/1950   Cancelled treatment:       Reason Eval/Treat Not Completed: Patient at procedure or test/unavailable. Pt off the floor for endoscopy. Will follow up for cognitive-linguistic assessment.  Joel Rogers, [place], CCC-SLP Speech-Language Pathologist Acute Rehabilitation Services Pager: [phone] Office: [phone]    Joel Rogers 01/21/2019, 1:07 PM

## 2019-01-21 NOTE — Consult Note (Signed)
Reason for Consult: Hematemesis Referring Physician: Hospital team  Joel Rogers is an 68 y.o. male.  HPI: Patient seen and examined and discussed with the hospital team as well as his nurse and he initially threw up a little bit of blood last night and has been spitting up some blood-tinged mucus all day and is never had this before and is been on both aspirin and Eliquis at home and they have adjusted his blood thinners while he has been here and he has had a gastric bypass years ago but no other GI work-up his family history is negative from a GI standpoint and he has not had a bowel movement since he is been at this hospital having been transferred from any pain he has no other complaints  Past Medical History:  Diagnosis Date  . Anemia    low iron  . Arthritis    bilateral knees  . Atrial fibrillation, chronic   . Chronic diastolic CHF (congestive heart failure) (HCC) 06/15/2018  . History of subdural hematoma   . Hyperlipidemia   . Hypertension   . Morbid obesity (HCC)   . Normal coronary arteries    by cardiac catheterization performed 03/14/06  . Obesity   . Pre-diabetes    pt denies  . S/P TAVR (transcatheter aortic valve replacement)    a. 07/25/18: Edwards Sapien 3 THV (size 26 mm, model # 9600TFX, serial # [id number]) by Dr. Laneta Simmers and Dr. Clifton James  . Sleep apnea    on CPAP  . Venous insufficiency     Past Surgical History:  Procedure Laterality Date  . CRANIOTOMY Right 08/27/2016   Procedure: CRANIOTOMY HEMATOMA EVACUATION SUBDURAL;  Surgeon: Maeola Harman, MD;  Location: [facility] OR;  Service: Neurosurgery;  Laterality: Right;  . GASTRIC BYPASS  09/2008  . JOINT REPLACEMENT  08/31/2006   right hip  . MULTIPLE EXTRACTIONS WITH ALVEOLOPLASTY N/A 07/13/2018   Procedure: Extraction of tooth #'s 3,8,10, 23-26, 30and 32 with alveoloplasty and gross debridement of remaining teeth;  Surgeon: Charlynne Pander, DDS;  Location: [facility] OR;  Service: Oral Surgery;  Laterality: N/A;  . RIGHT  HEART CATH N/A 07/06/2018   Procedure: RIGHT HEART CATH;  Surgeon: Kathleene Hazel, MD;  Location: [facility] INVASIVE CV LAB;  Service: Cardiovascular;  Laterality: N/A;  . RIGHT/LEFT HEART CATH AND CORONARY ANGIOGRAPHY N/A 07/10/2018   Procedure: RIGHT/LEFT HEART CATH AND CORONARY ANGIOGRAPHY;  Surgeon: Dolores Patty, MD;  Location: [facility] INVASIVE CV LAB;  Service: Cardiovascular;  Laterality: N/A;  . TEE WITHOUT CARDIOVERSION N/A 07/25/2018   Procedure: TRANSESOPHAGEAL ECHOCARDIOGRAM (TEE);  Surgeon: Kathleene Hazel, MD;  Location: [facility] OR;  Service: Open Heart Surgery;  Laterality: N/A;  . TEE WITHOUT CARDIOVERSION N/A 01/18/2019   Procedure: TRANSESOPHAGEAL ECHOCARDIOGRAM (TEE) WITH PROPOFOL;  Surgeon: Jonelle Sidle, MD;  Location: [facility] ORS;  Service: Cardiovascular;  Laterality: N/A;  . TOTAL HIP ARTHROPLASTY     right hip  . TRANSCATHETER AORTIC VALVE REPLACEMENT, TRANSFEMORAL  07/25/2018  . TRANSCATHETER AORTIC VALVE REPLACEMENT, TRANSFEMORAL N/A 07/25/2018   Procedure: TRANSCATHETER AORTIC VALVE REPLACEMENT, TRANSFEMORAL;  Surgeon: Kathleene Hazel, MD;  Location: [facility] OR;  Service: Open Heart Surgery;  Laterality: N/A;    Family History  Problem Relation Age of Onset  . Hypertension Father   . Cancer Father        prob prostate  . Alzheimer's disease Mother   . Alzheimer's disease Maternal Grandfather   . Breast cancer Sister   . Cancer Brother        "  brain tumor"    Social History:  reports that he has quit smoking. He has never used smokeless tobacco. He reports that he does not drink alcohol or use drugs.  Allergies: No Known Allergies  Medications: I have reviewed the patient's current medications.  Results for orders placed or performed during the hospital encounter of 01/13/19 (from the past 48 hour(s))  CBC     Status: Abnormal   Collection Time: 01/21/19  5:38 AM  Result Value Ref Range   WBC 26.7 (H) 4.0 - 10.5 K/uL   RBC 3.23 (L) 4.22 - 5.81 MIL/uL    Hemoglobin 7.7 (L) 13.0 - 17.0 g/dL    Comment: Reticulocyte Hemoglobin testing may be clinically indicated, consider ordering this additional test [id number]    HCT 22.0 (L) 39.0 - 52.0 %   MCV 68.1 (L) 80.0 - 100.0 fL   MCH 23.8 (L) 26.0 - 34.0 pg   MCHC 35.0 30.0 - 36.0 g/dL   RDW 54.0 98.1 - 19.1 %   Platelets 260 150 - 400 K/uL   nRBC 0.0 0.0 - 0.2 %    Comment: Performed at [facility], [address], [place], [name] [address]  Basic metabolic panel     Status: Abnormal   Collection Time: 01/21/19  5:38 AM  Result Value Ref Range   Sodium 136 135 - 145 mmol/L   Potassium 5.4 (H) 3.5 - 5.1 mmol/L   Chloride 113 (H) 98 - 111 mmol/L   CO2 18 (L) 22 - 32 mmol/L   Glucose, Bld 154 (H) 70 - 99 mg/dL   BUN 57 (H) 8 - 23 mg/dL   Creatinine, Ser 5.62 (H) 0.61 - 1.24 mg/dL   Calcium 7.8 (L) 8.9 - 10.3 mg/dL   GFR calc non Af Amer 39 (L) >60 mL/min   GFR calc Af Amer 45 (L) >60 mL/min   Anion gap 5 5 - 15    Comment: Performed at [facility], [address], [place], [name] [address]  Lipid panel     Status: Abnormal   Collection Time: 01/21/19  5:38 AM  Result Value Ref Range   Cholesterol 94 0 - 200 mg/dL   Triglycerides 67 <578 mg/dL   HDL 11 (L) >46 mg/dL   Total CHOL/HDL Ratio 8.5 RATIO   VLDL 13 0 - 40 mg/dL   LDL Cholesterol 70 0 - 99 mg/dL    Comment:        Total Cholesterol/HDL:CHD Risk Coronary Heart Disease Risk Table                     Men   Women  1/2 Average Risk   3.4   3.3  Average Risk       5.0   4.4  2 X Average Risk   9.6   7.1  3 X Average Risk  23.4   11.0        Use the calculated Patient Ratio above and the CHD Risk Table to determine the patient's CHD Risk.        ATP III CLASSIFICATION (LDL):  <100     mg/dL   Optimal  962-952  mg/dL   Near or Above                    Optimal  130-159  mg/dL   Borderline  841-324  mg/dL   High  >401     mg/dL   Very  High Performed at [facility], [address]  [place], [name] [address]     Ct Head Wo Contrast  Result Date: 01/20/2019 CLINICAL DATA:  68 y/o M; subarachnoid hemorrhage and stroke for follow-up. EXAM: CT HEAD WITHOUT CONTRAST TECHNIQUE: Contiguous axial images were obtained from the base of the skull through the vertex without intravenous contrast. COMPARISON:  01/19/2019 MRI of the head. FINDINGS: Brain: Multiple foci of hypoattenuation are present within the cerebellum and scattered over the cerebral convexities and within periventricular white matter corresponding to areas of infarction on the prior MRI of the brain. Findings are stable given differences in technique. Stable small volume of subarachnoid hemorrhage within the left central sulcus. No new stroke, hemorrhage, mass effect, intracranial hemorrhage, or herniation identified. Vascular: Calcific atherosclerosis of carotid siphons. No hyperdense vessel identified. Skull: Negative for fracture or focal lesion. Stable right frontal region craniotomy chronic postsurgical changes. Sinuses/Orbits: Mucosal thickening of the left frontal sinus antrum and anterior ethmoid air cells. Additional visible paranasal sinuses and the mastoid air cells are normally aerated. Orbits are unremarkable. Other: None. IMPRESSION: 1. Multiple foci of infarction are present scattered throughout the supratentorial cortex, periventricular white matter, and throughout the cerebellum. The distribution of infarction is stable from prior MRI given differences in technique. 2. Stable small focus of acute subarachnoid hemorrhage within the left central sulcus. Additional areas of hemorrhage suggested on MRI are not visible on CT, possibly chronic hemosiderin deposition. 3. No new acute intracranial abnormality identified. Electronically Signed   By: Mitzi Hansen M.D.   On: 01/20/2019 18:40   Mr Maxine Glenn Head Wo Contrast  Result Date: 01/19/2019 CLINICAL DATA:  Right upper extremity weakness EXAM: MRI HEAD WITHOUT  CONTRAST MRA HEAD WITHOUT CONTRAST TECHNIQUE: Multiplanar, multiecho pulse sequences of the brain and surrounding structures were obtained without intravenous contrast. Angiographic images of the head were obtained using MRA technique without contrast. COMPARISON:  Head CT 10/26/2016 FINDINGS: MRI HEAD FINDINGS BRAIN: There is scattered multifocal diffusion restriction throughout the brain. The largest areas of diffusion abnormality are in the cerebellum. There are bilateral areas of ischemia in the deep watershed distribution. There are multiple bilateral peripheral subcortical foci of ischemia. Areas along the left postcentral gyrus and right anterior convexity are suggestive subarachnoid hemorrhage. There is also magnetic susceptibility effect in the left cerebellum, also likely indicating an area of hemorrhage. There is mild edema corresponding to the sites of diffusion abnormality. Mild generalized volume loss. No midline shift. SKULL AND UPPER CERVICAL SPINE: The visualized skull base, calvarium, upper cervical spine and extracranial soft tissues are normal. SINUSES/ORBITS: No fluid levels or advanced mucosal thickening. No mastoid or middle ear effusion. The orbits are normal. MRA HEAD FINDINGS POSTERIOR CIRCULATION: --Basilar artery: Normal. --Posterior cerebral arteries: Normal. The right PCA is partially supplied by the posterior communicating artery. --Superior cerebellar arteries: Normal. --Inferior cerebellar arteries: Normal left AICA. The posterior inferior cerebral arteries are normal. ANTERIOR CIRCULATION: --Intracranial internal carotid arteries: Normal. --Anterior cerebral arteries: Normal. Both A1 segments are present. Patent anterior communicating artery. --Middle cerebral arteries: Moderate narrowing of the M2 segments bilaterally. --Posterior communicating arteries: Present on the right, absent on the left. IMPRESSION: 1. Numerous scattered foci of acute ischemia throughout the cerebrum and  cerebellum. Many of these lesions are most suggestive of embolic infarcts, given the widespread distribution over multiple vascular territories. Others are in a deep watershed distribution, as is seen in the setting of hypoperfusion events. 2. Areas of magnetic susceptibility effect superimposed on diffusion  abnormality along the left postcentral gyrus, right frontal convexity and lateral ventricular atria are suggestive subarachnoid blood. Noncontrast head CT recommended for clarification. 3. No midline shift or other mass effect. 4. No proximal intracranial arterial occlusion. Moderate narrowing of the M2 segments of both middle cerebral arteries. Critical Value/emergent results were called by telephone at the time of interpretation on 01/19/2019 at 5:32 pm to Dr. [facility] , who verbally acknowledged these results. Electronically Signed   By: Deatra Robinson M.D.   On: 01/19/2019 17:33   Mr Brain Wo Contrast  Result Date: 01/19/2019 CLINICAL DATA:  Right upper extremity weakness EXAM: MRI HEAD WITHOUT CONTRAST MRA HEAD WITHOUT CONTRAST TECHNIQUE: Multiplanar, multiecho pulse sequences of the brain and surrounding structures were obtained without intravenous contrast. Angiographic images of the head were obtained using MRA technique without contrast. COMPARISON:  Head CT 10/26/2016 FINDINGS: MRI HEAD FINDINGS BRAIN: There is scattered multifocal diffusion restriction throughout the brain. The largest areas of diffusion abnormality are in the cerebellum. There are bilateral areas of ischemia in the deep watershed distribution. There are multiple bilateral peripheral subcortical foci of ischemia. Areas along the left postcentral gyrus and right anterior convexity are suggestive subarachnoid hemorrhage. There is also magnetic susceptibility effect in the left cerebellum, also likely indicating an area of hemorrhage. There is mild edema corresponding to the sites of diffusion abnormality. Mild generalized volume  loss. No midline shift. SKULL AND UPPER CERVICAL SPINE: The visualized skull base, calvarium, upper cervical spine and extracranial soft tissues are normal. SINUSES/ORBITS: No fluid levels or advanced mucosal thickening. No mastoid or middle ear effusion. The orbits are normal. MRA HEAD FINDINGS POSTERIOR CIRCULATION: --Basilar artery: Normal. --Posterior cerebral arteries: Normal. The right PCA is partially supplied by the posterior communicating artery. --Superior cerebellar arteries: Normal. --Inferior cerebellar arteries: Normal left AICA. The posterior inferior cerebral arteries are normal. ANTERIOR CIRCULATION: --Intracranial internal carotid arteries: Normal. --Anterior cerebral arteries: Normal. Both A1 segments are present. Patent anterior communicating artery. --Middle cerebral arteries: Moderate narrowing of the M2 segments bilaterally. --Posterior communicating arteries: Present on the right, absent on the left. IMPRESSION: 1. Numerous scattered foci of acute ischemia throughout the cerebrum and cerebellum. Many of these lesions are most suggestive of embolic infarcts, given the widespread distribution over multiple vascular territories. Others are in a deep watershed distribution, as is seen in the setting of hypoperfusion events. 2. Areas of magnetic susceptibility effect superimposed on diffusion abnormality along the left postcentral gyrus, right frontal convexity and lateral ventricular atria are suggestive subarachnoid blood. Noncontrast head CT recommended for clarification. 3. No midline shift or other mass effect. 4. No proximal intracranial arterial occlusion. Moderate narrowing of the M2 segments of both middle cerebral arteries. Critical Value/emergent results were called by telephone at the time of interpretation on 01/19/2019 at 5:32 pm to Dr. Durward [facility]ard [facility] , who verbally acknowledged these results. Electronically Signed   By: Deatra Robinson M.D.   On: 01/19/2019 17:33   Mr Cervical Spine  Wo Contrast  Result Date: 01/19/2019 CLINICAL DATA:  Shoulder pain and progressive weakness EXAM: MRI CERVICAL SPINE WITHOUT CONTRAST TECHNIQUE: Multiplanar, multisequence MR imaging of the cervical spine was performed. No intravenous contrast was administered. COMPARISON:  None. FINDINGS: Alignment: Normal. Vertebrae: No focal marrow lesion. No compression fracture or evidence of discitis osteomyelitis. Cord: Normal caliber and signal. Posterior Fossa, vertebral arteries, paraspinal tissues: Visualized posterior fossa is normal. Vertebral artery flow voids are preserved. No prevertebral effusion. Disc levels: Sagittal imaging includes the atlantoaxial joint to  the level of the T2-3 disc space, with axial imaging of the disc spaces from C2-3 to C6-7. Axial images are degraded by motion. Within that limitation, there is no spinal canal stenosis above the C5 level. At C5-6, there is a small central disc protrusion that narrows the ventral thecal sac with mild spinal canal stenosis. At C6-7, there is a intermediate disc bulge causing mild spinal canal stenosis. There is poor visualization of the neural foramina because of motion. IMPRESSION: 1. Motion degraded examination, particularly limiting assessment of the neural foramina. 2. Mild spinal canal stenosis at C5-6 and C6-7. 3. No acute abnormality of the cervical spine. Electronically Signed   By: Deatra Robinson M.D.   On: 01/19/2019 17:45   Dg Chest Port 1 View  Result Date: 01/21/2019 CLINICAL DATA:  Cough and leukocytosis. EXAM: PORTABLE CHEST 1 VIEW COMPARISON:  01/15/2019 FINDINGS: Marked cardiomegaly and aortic valve replacement again noted. Pulmonary vascular congestion identified. LEFT basilar opacity noted. No pleural effusion or pneumothorax. A LEFT PICC line is again identified with tip overlying the LOWER SVC. IMPRESSION: New LEFT basilar opacity, question atelectasis versus airspace disease/pneumonia. Marked cardiomegaly with pulmonary vascular  congestion. Electronically Signed   By: Harmon Pier M.D.   On: 01/21/2019 11:37    ROS negative except above Blood pressure (!) 154/82, pulse (!) 102, temperature 97.6 F (36.4 C), temperature source Oral, resp. rate 12, height  (1.854 m), weight 128.1 kg, SpO2 100 %. Physical Exam vital signs stable afebrile no acute distress sitting comfortably in the bed lungs are clear regular rate and rhythm abdomen is soft nontender hemoglobin three-point drop BUN essentially unchanged creatinine slight increase  Assessment/Plan: Upper GI bleeding in a patient with multiple medical problems blood thinners held today Plan: The risks benefits methods of endoscopy was discussed with the patient and will proceed this afternoon with further work-up and plans and diet advancement pending those findings  Aubert Choyce E 01/21/2019, 12:23 PM

## 2019-01-21 NOTE — Anesthesia Preprocedure Evaluation (Addendum)
Anesthesia Evaluation  Patient identified by MRN, date of birth, ID band Patient awake    Reviewed: Allergy & Precautions, NPO status , Patient's Chart, lab work & pertinent test results  History of Anesthesia Complications Negative for: history of anesthetic complications  Airway Mallampati: II  TM Distance: >3 FB Neck ROM: Full    Dental  (+) Missing, Dental Advisory Given,    Pulmonary sleep apnea and Continuous Positive Airway Pressure Ventilation , former smoker,    Pulmonary exam normal breath sounds clear to auscultation       Cardiovascular hypertension, Pt. on medications +CHF  Normal cardiovascular exam+ dysrhythmias (on Eliquis) Atrial Fibrillation  Rhythm:Regular Rate:Normal  S/P TAVR 07/2018  TEE 01/18/19: EF 60-65%, severe LAE, severe RAE, mild-moderate TR, s/p AVR, moderately reduced RV systolic function   Neuro/Psych Hx of subdural hematoma    GI/Hepatic Neg liver ROS, Upper GI bleed   Endo/Other  negative endocrine ROS  Renal/GU negative Renal ROS     Musculoskeletal  (+) Arthritis ,   Abdominal   Peds  Hematology  (+) anemia ,   Anesthesia Other Findings Day of surgery medications reviewed with the patient.  Pt no longer uses CPAP  Reproductive/Obstetrics                           Anesthesia Physical Anesthesia Plan  ASA: III  Anesthesia Plan: MAC   Post-op Pain Management:    Induction:   PONV Risk Score and Plan: Treatment may vary due to age or medical condition and Propofol infusion  Airway Management Planned: Natural Airway and Nasal Cannula  Additional Equipment:   Intra-op Plan:   Post-operative Plan:   Informed Consent: I have reviewed the patients History and Physical, chart, labs and discussed the procedure including the risks, benefits and alternatives for the proposed anesthesia with the patient or authorized representative who has indicated  his/her understanding and acceptance.     Dental advisory given  Plan Discussed with: CRNA, Anesthesiologist and Surgeon  Anesthesia Plan Comments:       Anesthesia Quick Evaluation

## 2019-01-21 NOTE — Op Note (Signed)
[facility] Patient Name: Joel Rogers Procedure Date : 01/21/2019 MRN: [id number] Attending MD: Vida Rigger , MD Date of Birth: 04/30/1951 CSN: [id number] Age: 68 Admit Type: Inpatient Procedure:                Upper GI endoscopy Indications:              Hematemesis Providers:                Vida Rigger, MD, Zoe Lan, RN, Verita Schneiders,                            Technician Referring MD:              Medicines:                Fentanyl 50 micrograms IV, Midazolam 4 mg IV,                            Cetacaine spray Complications:            No immediate complications. Estimated Blood Loss:     Estimated blood loss: none. Procedure:                Pre-Anesthesia Assessment:                           - Prior to the procedure, a History and Physical                            was performed, and patient medications and                            allergies were reviewed. The patient's tolerance of                            previous anesthesia was also reviewed. The risks                            and benefits of the procedure and the sedation                            options and risks were discussed with the patient.                            All questions were answered, and informed consent                            was obtained. Prior Anticoagulants: The patient has                            taken Eliquis (apixaban), last dose was 1 day prior                            to procedure. ASA Grade Assessment: III - A patient  with severe systemic disease. After reviewing the                            risks and benefits, the patient was deemed in                            satisfactory condition to undergo the procedure.                           After obtaining informed consent, the endoscope was                            passed under direct vision. Throughout the                            procedure, the patient's blood pressure, pulse, and                             oxygen saturations were monitored continuously. The                            GIF-H190 ([id number] Olympus gastroscope was                            introduced through the mouth, and advanced to the                            body of the stomach. The upper GI endoscopy was                            technically difficult and complex due to excessive                            bleeding. Successful completion of the procedure                            was aided by attempting lavage but the clotted                            blood would not be suctioned. The patient tolerated                            the procedure well. Scope In: Scope Out: Findings:      The larynx was normal.      The upper third of the esophagus was normal.      Clotted blood was found in the middle third of the esophagus and in the       lower third of the esophagus.      Clotted blood was found in the entire examined stomach. After our       prolonged effort to try to get better visualization we elected to stop       the procedure Impression:               - Normal larynx.                           -  Normal upper third of esophagus.                           - Clotted blood in the lower third of the esophagus                            and in the middle third of the esophagus.                           - Clotted blood in the entire stomach.                           - No specimens collected. Moderate Sedation:      Moderate (conscious) sedation was administered by the endoscopy nurse       and supervised by the endoscopist. The following parameters were       monitored: oxygen saturation, heart rate, blood pressure, respiratory       rate, EKG, adequacy of pulmonary ventilation, and response to care. Recommendation:           - NPO today. Hold blood thinners                           - Continue present medications. Keep head of bed                            elevated to 30 degrees at  all times                           - Repeat upper endoscopy tomorrow because the                            visualization was poor.                           - Telephone GI clinic if symptomatic PRN. Procedure Code(s):        --- Professional ---                           [phone], 52, Esophagogastroduodenoscopy, flexible,                            transoral; diagnostic, including collection of                            specimen(s) by brushing or washing, when performed                            (separate procedure) Diagnosis Code(s):        --- Professional ---                           K22.8, Other specified diseases of esophagus                           K92.2, Gastrointestinal hemorrhage, unspecified  K92.0, Hematemesis CPT copyright 2018 American Medical Association. All rights reserved. The codes documented in this report are preliminary and upon coder review may  be revised to meet current compliance requirements. Vida Rigger, MD 01/21/2019 1:08:16 PM This report has been signed electronically. Number of Addenda: 0

## 2019-01-22 ENCOUNTER — Inpatient Hospital Stay (HOSPITAL_COMMUNITY): Payer: Medicare Other | Admitting: Anesthesiology

## 2019-01-22 ENCOUNTER — Encounter (HOSPITAL_COMMUNITY)
Admission: EM | Disposition: A | Discharge status: Rehabilitation Facility | Payer: Self-pay | Source: Home / Self Care | Attending: Internal Medicine

## 2019-01-22 ENCOUNTER — Encounter (HOSPITAL_COMMUNITY): Payer: Self-pay | Admitting: Certified Registered Nurse Anesthetist

## 2019-01-22 DIAGNOSIS — R578 Other shock: Secondary | ICD-10-CM

## 2019-01-22 DIAGNOSIS — R579 Shock, unspecified: Secondary | ICD-10-CM

## 2019-01-22 DIAGNOSIS — N170 Acute kidney failure with tubular necrosis: Secondary | ICD-10-CM

## 2019-01-22 DIAGNOSIS — A419 Sepsis, unspecified organism: Secondary | ICD-10-CM

## 2019-01-22 HISTORY — PX: ESOPHAGOGASTRODUODENOSCOPY (EGD) WITH PROPOFOL: SHX5813

## 2019-01-22 LAB — GLUCOSE, CAPILLARY
GLUCOSE-CAPILLARY: 114 mg/dL — AB (ref 70–99)
Glucose-Capillary: 112 mg/dL — ABNORMAL HIGH (ref 70–99)
Glucose-Capillary: 107 mg/dL — ABNORMAL HIGH (ref 70–99)
Glucose-Capillary: 99 mg/dL (ref 70–99)
Glucose-Capillary: 100 mg/dL — ABNORMAL HIGH (ref 70–99)

## 2019-01-22 LAB — CBC
HCT: 20.8 % — ABNORMAL LOW (ref 39.0–52.0)
HCT: 24.8 % — ABNORMAL LOW (ref 39.0–52.0)
HEMOGLOBIN: 8.6 g/dL — AB (ref 13.0–17.0)
Hemoglobin: 7.2 g/dL — ABNORMAL LOW (ref 13.0–17.0)
MCH: 24.7 pg — ABNORMAL LOW (ref 26.0–34.0)
MCH: 27 pg (ref 26.0–34.0)
MCHC: 34.6 g/dL (ref 30.0–36.0)
MCHC: 34.7 g/dL (ref 30.0–36.0)
MCV: 71.2 fL — ABNORMAL LOW (ref 80.0–100.0)
MCV: 77.7 fL — ABNORMAL LOW (ref 80.0–100.0)
Platelets: 216 10*3/uL (ref 150–400)
Platelets: 187 10*3/uL (ref 150–400)
RBC: 2.92 MIL/uL — ABNORMAL LOW (ref 4.22–5.81)
RBC: 3.19 MIL/uL — ABNORMAL LOW (ref 4.22–5.81)
RDW: 17.3 % — AB (ref 11.5–15.5)
RDW: 18.6 % — ABNORMAL HIGH (ref 11.5–15.5)
WBC: 25.3 10*3/uL — ABNORMAL HIGH (ref 4.0–10.5)
WBC: 21.3 10*3/uL — ABNORMAL HIGH (ref 4.0–10.5)
nRBC: 0.2 % (ref 0.0–0.2)
nRBC: 0.7 % — ABNORMAL HIGH (ref 0.0–0.2)

## 2019-01-22 LAB — BASIC METABOLIC PANEL
ANION GAP: 9 (ref 5–15)
BUN: 71 mg/dL — ABNORMAL HIGH (ref 8–23)
CO2: 15 mmol/L — ABNORMAL LOW (ref 22–32)
Calcium: 7.5 mg/dL — ABNORMAL LOW (ref 8.9–10.3)
Chloride: 115 mmol/L — ABNORMAL HIGH (ref 98–111)
Creatinine, Ser: 1.87 mg/dL — ABNORMAL HIGH (ref 0.61–1.24)
GFR calc Af Amer: 42 mL/min — ABNORMAL LOW (ref >60)
GFR calc non Af Amer: 36 mL/min — ABNORMAL LOW (ref >60)
Glucose, Bld: 126 mg/dL — ABNORMAL HIGH (ref 70–99)
Potassium: 5.5 mmol/L — ABNORMAL HIGH (ref 3.5–5.1)
SODIUM: 139 mmol/L (ref 135–145)

## 2019-01-22 LAB — PREPARE RBC (CROSSMATCH)

## 2019-01-22 LAB — MRSA PCR SCREENING: MRSA by PCR: NEGATIVE

## 2019-01-22 LAB — HEMOGLOBIN AND HEMATOCRIT, BLOOD
HCT: 22.7 % — ABNORMAL LOW (ref 39.0–52.0)
Hemoglobin: 7.9 g/dL — ABNORMAL LOW (ref 13.0–17.0)

## 2019-01-22 SURGERY — ESOPHAGOGASTRODUODENOSCOPY (EGD) WITH PROPOFOL
Anesthesia: Monitor Anesthesia Care

## 2019-01-22 MED ORDER — CALCIUM GLUCONATE-NACL 1-0.675 GM/50ML-% IV SOLN
1.0000 g | Freq: Once | INTRAVENOUS | Status: AC
  Administered 2019-01-22: 1000 mg via INTRAVENOUS
  Filled 2019-01-22: qty 50

## 2019-01-22 MED ORDER — SODIUM CHLORIDE 0.9 % IV SOLN
INTRAVENOUS | Status: DC
  Administered 2019-01-22 – 2019-01-23 (×3): via INTRAVENOUS

## 2019-01-22 MED ORDER — STERILE WATER FOR INJECTION IV SOLN
INTRAVENOUS | Status: DC
  Administered 2019-01-22: 11:00:00 via INTRAVENOUS
  Filled 2019-01-22 (×2): qty 850

## 2019-01-22 MED ORDER — SODIUM ZIRCONIUM CYCLOSILICATE 10 G PO PACK
10.0000 g | PACK | Freq: Three times a day (TID) | ORAL | Status: DC
  Administered 2019-01-23: 10 g via ORAL
  Filled 2019-01-22 (×4): qty 1

## 2019-01-22 MED ORDER — SODIUM POLYSTYRENE SULFONATE 15 GM/60ML PO SUSP
45.0000 g | Freq: Once | ORAL | Status: DC
  Filled 2019-01-22: qty 180

## 2019-01-22 MED ORDER — PROTHROMBIN COMPLEX CONC HUMAN 500 UNITS IV KIT: 5000.0000 [IU] | PACK | Status: DC

## 2019-01-22 MED ORDER — PROPOFOL 500 MG/50ML IV EMUL
INTRAVENOUS | Status: DC | PRN
  Administered 2019-01-22: 75 ug/kg/min via INTRAVENOUS

## 2019-01-22 MED ORDER — PROMETHAZINE HCL 25 MG/ML IJ SOLN: 6.2500 mg | INTRAMUSCULAR | Status: DC | PRN

## 2019-01-22 MED ORDER — SODIUM CHLORIDE 0.9% IV SOLUTION
Freq: Once | INTRAVENOUS | Status: AC
  Administered 2019-01-22: 13:00:00 via INTRAVENOUS

## 2019-01-22 MED ORDER — LACTATED RINGERS IV SOLN
INTRAVENOUS | Status: DC | PRN
  Administered 2019-01-22: 09:00:00 via INTRAVENOUS

## 2019-01-22 MED ORDER — SODIUM CHLORIDE 0.9 % IV SOLN
50.0000 ug/h | INTRAVENOUS | Status: DC
  Administered 2019-01-22 – 2019-01-25 (×8): 50 ug/h via INTRAVENOUS
  Filled 2019-01-22 (×13): qty 1

## 2019-01-22 MED ORDER — SODIUM CHLORIDE 0.9 % IV SOLN
INTRAVENOUS | Status: DC | PRN
  Administered 2019-01-22 – 2019-01-27 (×3): 250 mL via INTRAVENOUS

## 2019-01-22 MED ORDER — BUTAMBEN-TETRACAINE-BENZOCAINE 2-2-14 % EX AERO
INHALATION_SPRAY | CUTANEOUS | Status: DC | PRN
  Administered 2019-01-22: 1 via TOPICAL

## 2019-01-22 MED ORDER — SODIUM CHLORIDE 0.9 % IV SOLN
0.3000 ug/kg | Freq: Once | INTRAVENOUS | Status: AC
  Administered 2019-01-22: 38.4 ug via INTRAVENOUS
  Filled 2019-01-22: qty 9.6

## 2019-01-22 MED ORDER — PHENYLEPHRINE 40 MCG/ML (10ML) SYRINGE FOR IV PUSH (FOR BLOOD PRESSURE SUPPORT)
PREFILLED_SYRINGE | INTRAVENOUS | Status: DC | PRN
  Administered 2019-01-22: 80 ug via INTRAVENOUS

## 2019-01-22 MED ORDER — ALBUMIN HUMAN 25 % IV SOLN
25.0000 g | Freq: Once | INTRAVENOUS | Status: AC
  Administered 2019-01-22: 25 g via INTRAVENOUS
  Filled 2019-01-22: qty 100

## 2019-01-22 MED ORDER — SODIUM CHLORIDE 0.9% IV SOLUTION
Freq: Once | INTRAVENOUS | Status: AC
  Administered 2019-01-22: 18:00:00 via INTRAVENOUS

## 2019-01-22 SURGICAL SUPPLY — 14 items

## 2019-01-22 NOTE — Progress Notes (Signed)
Patient off floor for procedure 

## 2019-01-22 NOTE — Anesthesia Postprocedure Evaluation (Signed)
Anesthesia Post Note  Patient: [name]  Procedure(s) Performed: ESOPHAGOGASTRODUODENOSCOPY (EGD) WITH PROPOFOL (N/A )     Patient location during evaluation: PACU Anesthesia Type: MAC Level of consciousness: awake and alert Pain management: pain level controlled Vital Signs Assessment: post-procedure vital signs reviewed and stable Respiratory status: spontaneous breathing, nonlabored ventilation and respiratory function stable Cardiovascular status: blood pressure returned to baseline and stable Postop Assessment: no apparent nausea or vomiting Anesthetic complications: no Comments: Ongoing resuscitation with pRBCs and albumin. Dr. Cristina Gong aware and has communicated with hospitalist. BP stable with SBP in low 100s and patient asymptomatic prior to discharge from PACU.    Last Vitals:  Vitals:   01/22/19 0945 01/22/19 0948  BP: (!) 109/53 (!) 108/56  Pulse: 89 87  Resp: 17 18  Temp:  36.9 C  SpO2: 100% 100%    Last Pain:  Vitals:   01/22/19 0948  TempSrc: Oral  PainSc:                  Brennan Bailey

## 2019-01-22 NOTE — Progress Notes (Signed)
Findings of today's (repeat) endoscopy discussed with Dr. Randol Kern.  Unfortunately, the appearance was essentially the same as yesterday, with a large amount of blood and clot which could not be effectively suctioned, and virtually completely obscuring the view.  Although no active bleeding was identified, a lot of the blood appeared quite fresh, suggesting that there has been recent or ongoing bleeding and that this was not just residual blood from yesterday.  This correlates with the fact that the patient's hemoglobin is essentially unchanged after 2 units of packed cells overnight.  There were no obvious esophageal varices, but I cannot confidently exclude a Mallory-Weiss tear, an anastomotic ulceration, etc.  Accordingly, no intervention could be performed.  Plan:  1.  Octreotide empirically 2.  IR consultation (not a good candidate, given high creatinine) 3.  Surgical consultation (last resort, but appropriate for them to be familiar with patient since alternative methods of control do not appear to be effective or appropriate) 4.  Continue supportive care with IV PPI and transfusion support as needed.  As long as the patient remains hemodynamically stable, which has been the case up to this time (although pressure was a little bit soft in recovery following his endoscopy), our hand is not forced to be more aggressive.  Even now, in recovery, the patient is sitting up and talking in no evident distress.  It might be reasonable to give him another day or 2 to see if he stops bleeding on his own, since we do not really have great alternatives and since the aspirin effect on his blood clotting will progressively diminish with time. 5.  If the patient destabilizes and/or has ongoing bleeding beyond another day or 2, our hand would probably be forced to take further steps.  Assuming that he is not deemed an appropriate candidate for IR, if he comes to surgery, once intubated, consideration might be  given to preoperative gastric lavage with a large bore tube, ideally with preprocedural metoclopramide to try to clear the stomach.  Although in my experience this is seldom effective (and in this case would be somewhat higher risk, given post-surgical anatomy), in the best case scenario, a bleeding site might be identified and remedied endoscopically, thereby precluding the need to proceed with open surgical management.  We will follow with you.  Dr. Dulce Sellar will be covering our service after noon today.  Joel Rogers, M.D. Pager [phone] If no answer or after 5 PM call [phone]

## 2019-01-22 NOTE — Consult Note (Addendum)
NAME:  Joel Rogers, MRN:  [id number], DOB:  [birth year], LOS: 9 ADMISSION DATE:  01/13/2019, CONSULTATION DATE:  3/2 REFERRING MD:  Dr. Randol Kern, CHIEF COMPLAINT:  GIB    Brief History   Joel Rogers admitted to [facility] 2/22 with weakness, fall, shoulder pain.  Concern for RLE cellulitis, r/o for endocarditis with TEE.  Later developed coffee ground emesis with ABLA.  EGD with bleeding but unable to identify source 3/1, 3/2.   History of present illness   68 y/o Rogers who presented to the ER on 2/22 with reports of bilateral shoulder pain, generalized weakness, decreased PO intake, difficulty walking and sleepiness.  He reported on admit "I feel like I have the flu".  Symptoms persisted for approximately 5 days prior to admit.   Initial ER evaluation found him to have fever to 102.1, tachycardia & tachypnea. Labs notable for WBC 21.  He was noted to have concern for RLE cellulitis and treated accordingly.  Admit blood cultures positive 1/2 bottles group C strep. TEE was performed 2/27 which was negative for vegetation.  Subsequent blood cultures negative.  During course of admit, he was noted to have decreased right sided movement and MRI was completed 2/28 which showed bilateral infarcts.  He was transferred to [facility] 2/29 for Neurology evaluation.  The patient developed coffee ground emesis on 3/1 with associated anemia.  Eliquis, ASA held.  He was evaluated by GI and had an endoscopy on 3/1, 3/2 which showed large amount of blood / blood clot in the stomach & distal esophagus but unable to visualize where bleeding was originating. He required transfusion of 2 units PRBC 3/1 pm and 2 additional units of PRBC 3/2 am with continued decrease in Hgb (12.2 admit, 10 2/28 > 7.7 3/1, 7.2 3/2 am).  Systolic BP range from 73 to 110.  PCCM consulted for evaluation.    Past Medical History  OSA on CPAP  Morbid Obesity  TAVR  Chronic dCHF  SDH s/p Craniotomy 08/2016 Anemia  HTN HLD Gastric Bypass  Significant  Hospital Events   2/22 Admit to [facility]   Consults:  GI  IR  CCS PCCM   Procedures:     Significant Diagnostic Tests:  2/27 TEE >> negative for vegetations  2/28  MRI / MRA Head >> numerous scattered foci of acute ischemia throughout the cerebrum, cerebellum, many of these lesions are most suggestive of embolic infarcts / others are in a deep watershed distribution, no proximal intracranial arterial occlusion, moderate narrowing of the M2 segments of both middle cerebral arteries  3/01 EGD >> clotted blood in the lower third of the esophagus and in the middle third of the esophagus. Clotted blood in the entire stomach. 3/02 EGD >> Red blood in the lower third of the esophagus. Clotted blood in the entire stomach.  Micro Data:  UC 2/22 >> 10k insignificant growth  BCx2 2/22 >> Group C strep >> pan sensitive  BCx2 2/24 >> negative   Antimicrobials:  Zosyn 3/1 >>   Interim history/subjective:  Pt reports feeling better overall since admit.  Family at bedside.   Objective   Blood pressure 110/66, pulse 78, temperature 98.4 F (36.9 C), temperature source Oral, resp. rate 17, height 6\' 1"  (1.854 Rogers), weight 128.1 kg, SpO2 100 %.        Intake/Output Summary (Last 24 hours) at 01/22/2019 1406 Last data filed at 01/22/2019 1245 Gross per 24 hour  Intake 2037.53 ml  Output 685 ml  Net  1352.53 ml   Filed Weights   01/19/19 2106 01/20/19 0423 01/22/19 0802  Weight: 123.3 kg 128.1 kg 128.1 kg    Examination: General: elderly male lying in bed in NAD, family at bedside   HEENT: MM pink/dry, poor dentition, mild exophthalmus  Neuro: AAOx4, speech clear, MAE / generalized weakness  CV: s1s2 rrr, 3/6 murmur  PULM: even/non-labored, lungs bilaterally clear  IH:KVQQ, non-tender, bsx4 active  Extremities: warm/dry,  edema  Skin: no rashes or lesions  Resolved Hospital Problem list      Assessment & Plan:   Acute GIB  -s/p EGD x2, unable to identify source -hx Rouen-Y Gastric  Bypass  P: Appreciate GI input  Transfer to ICU for observation  CCS, IR consulted > GI hoping watchful waiting and injury will heal without intervention.  See Dr. Oneta Rack notes regarding possible surgical plan.  Defer sandostatin, protonix gtt's to GI   Acute Blood Loss Anemia  P: Transfuse per ICU guidelines  Assess evening CBC & in am   CVA / Multifocal Infarcts -concern for embolic / watershed events P: Neurology following with rec's > ok for permissive hypertension (OK if <220/120) Long term BP goals > normotension  PT / OT efforts  Follow up CT head 3/3 planned   Hypotension / Sepsis  Physiology  -no clear source infection identified, GIB likely contributor to AKI, hypotension -possible aspiration  P: If volume required, transfuse with PRBC NS at 68ml/hr  Stop sodium bicarbonate gtt Empiric zosyn as above for possible aspiration  AKI  -in setting of hypotension, ABLA  Mild Hyperkalemia  -suspect 4 unit PRBC transfusion may be contributing + AKI  P: NS at 61ml/hr  Trend BMP / urinary output Replace electrolytes as indicated Avoid nephrotoxic agents, ensure adequate renal perfusion PR Kayexalate ordered per TRH  Atrial Fibrillation   Hx TAVR on Eliquis  P: Hold Eliquis, ASA Tele monitoring   Chronic dCHF  P: Monitor I/O's  Once BP / renal function stabilized, he may need diuretics   Hyperlipidemia  P: Hold statin while NPO  Morbid Obesity s/p Gastric Bypass  P: NPO for now with GIB  Early nutrition   OSA on CPAP  P: Hold CPAP for now with N/V, GIB   Best practice:  Diet: NPO  Pain/Anxiety/Delirium protocol (if indicated): n/a VAP protocol (if indicated): n/a  DVT prophylaxis: SCD's GI prophylaxis: protonix gtt, sandostatin gtt  Glucose control: n/a  Mobility: as tolerated  Code Status: Full Code  Family Communication: patient, sister and brother-in-law updated at bedside 3/2 Disposition: ICU   Labs   CBC: Recent Labs  Lab  01/16/19 0427 01/17/19 0418 01/18/19 0424 01/19/19 0526 01/21/19 0538 01/21/19 2211 01/22/19 0217  WBC 26.2* 24.2* 20.9* 17.3* 26.7* 25.1* 25.3*  NEUTROABS 22.4* 19.4* 15.4* 12.5*  --   --   --   HGB 11.5* 11.0* 10.4* 10.0* 7.7* 7.5* 7.2*  HCT 33.8* 31.7* 30.4* 29.8* 22.0* 20.9* 20.8*  MCV Joel.6* 69.7* Joel.0* 70.3* Joel.1* 70.8* 71.2*  PLT 78* 105* 153 224 260 234 216    Basic Metabolic Panel: Recent Labs  Lab 01/16/19 0427 01/17/19 0418 01/18/19 0424 01/19/19 0526 01/21/19 0538 01/22/19 0217  NA 139 138 136 135 136 139  K 4.7 4.9 5.1 5.0 5.4* 5.5*  CL 112* 112* 112* 110 113* 115*  CO2 19* 19* 18* 19* 18* 15*  GLUCOSE 92 100* 95 106* 154* 126*  BUN 53* 51* 51* 51* 57* 71*  CREATININE 1.50* 1.45* 1.Joel* 1.60* 1.77*  1.87*  CALCIUM 8.2* 8.2* 8.1* 8.0* 7.8* 7.5*  MG 3.0*  --   --   --   --   --    GFR: Estimated Creatinine Clearance: 53 mL/min (A) (by C-G formula based on SCr of 1.87 mg/dL (H)). Recent Labs  Lab 01/19/19 0526 01/21/19 0538 01/21/19 2211 01/22/19 0217  WBC 17.3* 26.7* 25.1* 25.3*    Liver Function Tests: Recent Labs  Lab 01/16/19 0427 01/17/19 0418 01/18/19 0424 01/19/19 0526  AST 38 ALT ALKPHOS 96 105 105 102  BILITOT 2.3* 1.6* 1.4* 1.3*  PROT 6.2* 6.0* 6.1* 6.3*  ALBUMIN 2.4* 2.3* 2.1* 2.0*   No results for input(s): LIPASE, AMYLASE in the last 168 hours. No results for input(s): AMMONIA in the last 168 hours.  ABG    Component Value Date/Time   PHART 7.362 07/25/2018 0804   PCO2ART 45.6 07/25/2018 0804   PO2ART 333.0 (H) 07/25/2018 0804   HCO3 22.7 01/13/2019 1550   TCO2 25 07/25/2018 1018   ACIDBASEDEF 2.2 (H) 01/13/2019 1550   O2SAT 74.9 01/13/2019 1550     Coagulation Profile: No results for input(s): INR, PROTIME in the last 168 hours.  Cardiac Enzymes: No results for input(s): CKTOTAL, CKMB, CKMBINDEX, TROPONINI in the last 168 hours.  HbA1C: Hemoglobin A1C  Date/Time Value Ref Range Status   01/22/2016 6.2  Final   Hgb A1c MFr Bld  Date/Time Value Ref Range Status  07/21/2018 10:20 AM 5.8 (H) 4.8 - 5.6 % Final    Comment:    (NOTE) Pre diabetes:          5.7%-6.4% Diabetes:              >6.4% Glycemic control for   <7.0% adults with diabetes   11/26/2016 02:06 PM 5.6 <5.7 % Final    Comment:      For the purpose of screening for the presence of diabetes:   <5.7%       Consistent with the absence of diabetes 5.7-6.4 %   Consistent with increased risk for diabetes (prediabetes) >=6.5 %     Consistent with diabetes   This assay result is consistent with a decreased risk of diabetes.   Currently, no consensus exists regarding use of hemoglobin A1c for diagnosis of diabetes in children.   According to American Diabetes Association (ADA) guidelines, hemoglobin A1c <7.0% represents optimal control in non-pregnant diabetic patients. Different metrics may apply to specific patient populations. Standards of Medical Care in Diabetes (ADA).       CBG: Recent Labs  Lab 01/21/19 2047 01/22/19 0038 01/22/19 0404 01/22/19 0832 01/22/19 1247  GLUCAP 130* 114* 112* 107* 99    Review of Systems:  Positives in [place]   Gen: Denies fever, chills, weight change, fatigue, night sweats HEENT: Denies blurred vision, double vision, hearing loss, tinnitus, sinus congestion, rhinorrhea, sore throat, neck stiffness, dysphagia PULM: Denies shortness of breath, cough, sputum production, hemoptysis, wheezing CV: Denies chest pain, edema, orthopnea, paroxysmal nocturnal dyspnea, palpitations GI: Denies abdominal pain, nausea, vomiting, coffee ground emesis, diarrhea, hematochezia, melena, constipation, change in bowel habits GU: Denies dysuria, hematuria, polyuria, oliguria, urethral discharge Endocrine: Denies hot or cold intolerance, polyuria, polyphagia or appetite change Derm: Denies rash, dry skin, scaling or peeling skin change Heme: Denies easy bruising, bleeding, bleeding  gums Neuro: Denies headache, numbness, weakness, fall prior to admit, slurred speech, loss of memory or consciousness   Past Medical History  He,  has a past medical history of Anemia, Arthritis, Atrial fibrillation, chronic, Chronic diastolic CHF (congestive heart failure) (HCC) (06/15/2018), History of subdural hematoma, Hyperlipidemia, Hypertension, Morbid obesity (HCC), Normal coronary arteries, Obesity, Pre-diabetes, S/P TAVR (transcatheter aortic valve replacement), Sleep apnea, and Venous insufficiency.   Surgical History    Past Surgical History:  Procedure Laterality Date  . CRANIOTOMY Right 08/27/2016   Procedure: CRANIOTOMY HEMATOMA EVACUATION SUBDURAL;  Surgeon: Maeola Harman, MD;  Location: [facility] OR;  Service: Neurosurgery;  Laterality: Right;  . GASTRIC BYPASS  09/2008  . JOINT REPLACEMENT  08/31/2006   right hip  . MULTIPLE EXTRACTIONS WITH ALVEOLOPLASTY N/A 07/13/2018   Procedure: Extraction of tooth #'s 3,8,10, 23-26, 30and 32 with alveoloplasty and gross debridement of remaining teeth;  Surgeon: Charlynne Pander, DDS;  Location: [facility] OR;  Service: Oral Surgery;  Laterality: N/A;  . RIGHT HEART CATH N/A 07/06/2018   Procedure: RIGHT HEART CATH;  Surgeon: Kathleene Hazel, MD;  Location: [facility] INVASIVE CV LAB;  Service: Cardiovascular;  Laterality: N/A;  . RIGHT/LEFT HEART CATH AND CORONARY ANGIOGRAPHY N/A 07/10/2018   Procedure: RIGHT/LEFT HEART CATH AND CORONARY ANGIOGRAPHY;  Surgeon: Dolores Patty, MD;  Location: [facility] INVASIVE CV LAB;  Service: Cardiovascular;  Laterality: N/A;  . TEE WITHOUT CARDIOVERSION N/A 07/25/2018   Procedure: TRANSESOPHAGEAL ECHOCARDIOGRAM (TEE);  Surgeon: Kathleene Hazel, MD;  [facility];  Service: Open Heart Surgery;  Laterality: N/A;  . TEE WITHOUT CARDIOVERSION N/A 01/18/2019   Procedure: TRANSESOPHAGEAL ECHOCARDIOGRAM (TEE) WITH PROPOFOL;  Surgeon: Jonelle Sidle, MD;  Location: [facility] ORS;  Service: Cardiovascular;  Laterality: N/A;   . TOTAL HIP ARTHROPLASTY     right hip  . TRANSCATHETER AORTIC VALVE REPLACEMENT, TRANSFEMORAL  07/25/2018  . TRANSCATHETER AORTIC VALVE REPLACEMENT, TRANSFEMORAL N/A 07/25/2018   Procedure: TRANSCATHETER AORTIC VALVE REPLACEMENT, TRANSFEMORAL;  Surgeon: Kathleene Hazel, MD;  Location: [facility] OR;  Service: Open Heart Surgery;  Laterality: N/A;     Social History   reports that he has quit smoking. He has never used smokeless tobacco. He reports that he does not drink alcohol or use drugs.   Family History   His family history includes Alzheimer's disease in his maternal grandfather and mother; Breast cancer in his sister; Cancer in his brother and father; Hypertension in his father.   Allergies No Known Allergies   Home Medications  Prior to Admission medications   Medication Sig Start Date End Date Taking? Authorizing Provider  acetaminophen (TYLENOL) 650 MG CR tablet Take 650 mg by mouth 2 (two) times daily as needed for pain.   Yes [provider]  amoxicillin (AMOXIL) 500 MG capsule Take 2,000 mg (4 tablets) one hour prior to dental visits. 09/12/18  Yes Janetta Hora, PA-C  apixaban (ELIQUIS) 5 MG TABS tablet Take 1 tablet (5 mg total) by mouth 2 (two) times daily. 07/27/18  Yes Janetta Hora, PA-C  aspirin 81 MG chewable tablet Chew 1 tablet (81 mg total) by mouth daily. 07/27/18  Yes Janetta Hora, PA-C  atorvastatin (LIPITOR) 80 MG tablet TAKE 1 TABLET BY MOUTH ONCE DAILY WITH  BREAKFAST 09/13/18  Yes Runell Gess, MD  colchicine 0.6 MG tablet TAKE 1 TABLET BY MOUTH TWICE DAILY 09/19/18  Yes Donita Brooks, MD  diltiazem (CARDIZEM CD) 120 MG 24 hr capsule Take 1 capsule (120 mg total) by mouth daily. 12/08/18  Yes [place], Velna Hatchet, MD  ergocalciferol (VITAMIN D2) 1.25 MG (50000 UT) capsule Take 50,000 Units by mouth  once a week.   Yes [provider]  spironolactone (ALDACTONE) 25 MG tablet Take 0.5 tablets (12.5 mg total) by mouth daily.  07/16/18 07/16/19 Yes Barrett, Joline Salt, PA-C  torsemide (DEMADEX) 20 MG tablet Take 40 mg by mouth as needed. Use as needed if weight is over 245 pounds.   Yes [provider]     Critical care time: 35 minutes     Canary Brim, NP-C [facility] Pgr: [phone] or if no answer [phone] 01/22/2019, 2:07 PM  Attending Note:  69 year old male with extensive PMH who presented to the hospital with cellulitis of the leg.  There was a concern for endocarditis and a TEE was performed.  Subsequently the patient developed a GI bleed accompanied by hypotension and was transfused 4 units pRBC.  PCCM was consulted for persistent hypotension.  On exam, SBP of 72 noted, patient is awake and interactive however with bibasilar crackles.  I reviewed CXR myself, pulmonary edema noted.  Discussed with TRH-MD.  Will transfer to the ICU.  Continue IV hydration.  If continues to be hypotensive will start low dose pressors.  Hold off further transfusion for now.  H&H ordered.  Continue zosyn for now.  F/U on cultures.  PCCM will admit.  The patient is critically ill with multiple organ systems failure and requires high complexity decision making for assessment and support, frequent evaluation and titration of therapies, application of advanced monitoring technologies and extensive interpretation of multiple databases.   Critical Care Time devoted to patient care services described in this note is  45  Minutes. This time reflects time of care of this signee Dr Koren Bound. This critical care time does not reflect procedure time, or teaching time or supervisory time of PA/NP/Med student/Med Resident etc but could involve care discussion time.  Alyson Reedy, Rogers.D. [facility] Pulmonary/Critical Care Medicine. Pager: [phone]. After hours pager: [phone].

## 2019-01-22 NOTE — Progress Notes (Signed)
1st unit of blood stopped at 1033, vitals taken

## 2019-01-22 NOTE — Consult Note (Signed)
Chief Complaint: Patient was seen in consultation today for consideration of mesenteric arteriogram with possible embolization Chief Complaint  Patient presents with  . Shoulder Pain   at the request of Dr Ria Clock  Referring Physician(s): Dr Hector Shade  Supervising Physician: Irish Lack  Patient Status: [facility] - In-pt  History of Present Illness: Joel Rogers is a 68 y.o. male   Hematemesis Post hemorrhagic anemia On Eliquis and ASA with no PPI coverage Known A fib and TAVR 07/2018  Requiring transfusion Hg 10--7.7--7.5--7.2  Has had 2 EGD in last 2 days  Dr Matthias Hughs mote today: Unfortunately, the appearance was essentially the same as yesterday, with a large amount of blood and clot which could not be effectively suctioned, and virtually completely obscuring the view.  Although no active bleeding was identified, a lot of the blood appeared quite fresh, suggesting that there has been recent or ongoing bleeding and that this was not just residual blood from yesterday.  This correlates with the fact that the patient's hemoglobin is essentially unchanged after 2 units of packed cells overnight. There were no obvious esophageal varices, but I cannot confidently exclude a Mallory-Weiss tear, an anastomotic ulceration, etc.  Accordingly, no intervention could be performed.  Seeking IR recommendation as far as need for mesenteric arteriogram and possible embolization  Spoke to Dr Fredia Sorrow re this pt Recommendations:  Need Nephrology consult: Cr 1.87 today When Cr is to the point we could safely image this pt; would recommend CTA Abd/Pelvis with BRTO protocol Would need 50-75 cc contrast for this procedure Not sure if bleeding is arterial or venous at this point  I spoke to Dr Matthias Hughs He is in agreement with these recommendations Eliquis and ASA stopped On Octreotide  Will keep on IR Radar Await Nephrology consult Will keep eye on plans from GI  Discussed with pt  and sister in law in room Wife on speaker phone All in agreement with good understanding   Past Medical History:  Diagnosis Date  . Anemia    low iron  . Arthritis    bilateral knees  . Atrial fibrillation, chronic   . Chronic diastolic CHF (congestive heart failure) (HCC) 06/15/2018  . History of subdural hematoma   . Hyperlipidemia   . Hypertension   . Morbid obesity (HCC)   . Normal coronary arteries    by cardiac catheterization performed 03/14/06  . Obesity   . Pre-diabetes    pt denies  . S/P TAVR (transcatheter aortic valve replacement)    a. 07/25/18: Edwards Sapien 3 THV (size 26 mm, model # 9600TFX, serial # [id number]) by Dr. Laneta Simmers and Dr. Clifton James  . Sleep apnea    on CPAP  . Venous insufficiency     Past Surgical History:  Procedure Laterality Date  . CRANIOTOMY Right 08/27/2016   Procedure: CRANIOTOMY HEMATOMA EVACUATION SUBDURAL;  Surgeon: Maeola Harman, MD;  Location: [facility] OR;  Service: Neurosurgery;  Laterality: Right;  . GASTRIC BYPASS  09/2008  . JOINT REPLACEMENT  08/31/2006   right hip  . MULTIPLE EXTRACTIONS WITH ALVEOLOPLASTY N/A 07/13/2018   Procedure: Extraction of tooth #'s 3,8,10, 23-26, 30and 32 with alveoloplasty and gross debridement of remaining teeth;  Surgeon: Charlynne Pander, DDS;  Location: [facility] OR;  Service: Oral Surgery;  Laterality: N/A;  . RIGHT HEART CATH N/A 07/06/2018   Procedure: RIGHT HEART CATH;  Surgeon: Kathleene Hazel, MD;  Location: [facility] INVASIVE CV LAB;  Service: Cardiovascular;  Laterality: N/A;  . RIGHT/LEFT  HEART CATH AND CORONARY ANGIOGRAPHY N/A 07/10/2018   Procedure: RIGHT/LEFT HEART CATH AND CORONARY ANGIOGRAPHY;  Surgeon: Dolores Patty, MD;  Location: [facility] INVASIVE CV LAB;  Service: Cardiovascular;  Laterality: N/A;  . TEE WITHOUT CARDIOVERSION N/A 07/25/2018   Procedure: TRANSESOPHAGEAL ECHOCARDIOGRAM (TEE);  Surgeon: Kathleene Hazel, MD;  Location: [facility] OR;  Service: Open Heart Surgery;  Laterality: N/A;  . TEE  WITHOUT CARDIOVERSION N/A 01/18/2019   Procedure: TRANSESOPHAGEAL ECHOCARDIOGRAM (TEE) WITH PROPOFOL;  Surgeon: Jonelle Sidle, MD;  Location: [facility] ORS;  Service: Cardiovascular;  Laterality: N/A;  . TOTAL HIP ARTHROPLASTY     right hip  . TRANSCATHETER AORTIC VALVE REPLACEMENT, TRANSFEMORAL  07/25/2018  . TRANSCATHETER AORTIC VALVE REPLACEMENT, TRANSFEMORAL N/A 07/25/2018   Procedure: TRANSCATHETER AORTIC VALVE REPLACEMENT, TRANSFEMORAL;  Surgeon: Kathleene Hazel, MD;  Location: [facility] OR;  Service: Open Heart Surgery;  Laterality: N/A;    Allergies: Patient has no known allergies.  Medications: Prior to Admission medications   Medication Sig Start Date End Date Taking? Authorizing Provider  acetaminophen (TYLENOL) 650 MG CR tablet Take 650 mg by mouth 2 (two) times daily as needed for pain.   Yes [provider]  amoxicillin (AMOXIL) 500 MG capsule Take 2,000 mg (4 tablets) one hour prior to dental visits. 09/12/18  Yes Janetta Hora, PA-C  apixaban (ELIQUIS) 5 MG TABS tablet Take 1 tablet (5 mg total) by mouth 2 (two) times daily. 07/27/18  Yes Janetta Hora, PA-C  aspirin 81 MG chewable tablet Chew 1 tablet (81 mg total) by mouth daily. 07/27/18  Yes Janetta Hora, PA-C  atorvastatin (LIPITOR) 80 MG tablet TAKE 1 TABLET BY MOUTH ONCE DAILY WITH  BREAKFAST 09/13/18  Yes Runell Gess, MD  colchicine 0.6 MG tablet TAKE 1 TABLET BY MOUTH TWICE DAILY 09/19/18  Yes Donita Brooks, MD  diltiazem (CARDIZEM CD) 120 MG 24 hr capsule Take 1 capsule (120 mg total) by mouth daily. 12/08/18  Yes Neihart, Velna Hatchet, MD  ergocalciferol (VITAMIN D2) 1.25 MG (50000 UT) capsule Take 50,000 Units by mouth once a week.   Yes [provider]  spironolactone (ALDACTONE) 25 MG tablet Take 0.5 tablets (12.5 mg total) by mouth daily. 07/16/18 07/16/19 Yes Barrett, Joline Salt, PA-C  torsemide (DEMADEX) 20 MG tablet Take 40 mg by mouth as needed. Use as needed if weight is over  245 pounds.   Yes [provider]     Family History  Problem Relation Age of Onset  . Hypertension Father   . Cancer Father        prob prostate  . Alzheimer's disease Mother   . Alzheimer's disease Maternal Grandfather   . Breast cancer Sister   . Cancer Brother        "brain tumor"    Social History   Socioeconomic History  . Marital status: Married    Spouse name: [name]  . Number of children: 2  . Years of education: 66  . Highest education level: Not on file  Occupational History  . 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 Financial resource strain: Not on file  . Food insecurity:    Worry: Not on file    Inability: Not on file  . Transportation needs:    Medical: Not on file    Non-medical: Not on file  Tobacco Use  . Smoking status: Former Games developer  . Smokeless tobacco: Never Used  Substance  and Sexual Activity  . Alcohol use: No  . Drug use: No  . Sexual activity: Not Currently  Lifestyle  . Physical activity:    Days per week: Not on file    Minutes per session: Not on file  . Stress: Not on file  Relationships  . Social connections:    Talks on phone: Not on file    Gets together: Not on file    Attends religious service: Not on file    Active member of club or organization: Not on file    Attends meetings of clubs or organizations: Not on file    Relationship status: Not on file  Other Topics Concern  . Not on file  Social History Narrative   Doctorate in ministry   Retired Data processing manager   Disability from hip replacement   Lives with wife [name]   Exercises at the [employer] - water exercise    Review of Systems: A 12 point ROS discussed and pertinent positives are indicated in the HPI above.  All other systems are negative.  Review of Systems  Constitutional: Positive for activity change, appetite change, diaphoresis and fatigue. Negative for unexpected weight change.    Respiratory: Positive for shortness of breath. Negative for cough.   Cardiovascular: Negative for chest pain.  Gastrointestinal: Positive for vomiting.  Neurological: Positive for weakness.  Psychiatric/Behavioral: Negative for confusion.    Vital Signs: BP 114/71   Pulse 84   Temp 98.4 F (36.9 C) (Oral)   Resp (!) 24   Ht 6\' 1"  (1.854 m)   Wt 282 lb 6.6 oz (128.1 kg)   SpO2 100%   BMI 37.26 kg/m   Physical Exam Vitals signs reviewed.  Cardiovascular:     Rate and Rhythm: Normal rate and regular rhythm.     Heart sounds: Normal heart sounds.  Pulmonary:     Effort: Pulmonary effort is normal.     Breath sounds: Normal breath sounds.  Abdominal:     General: Bowel sounds are normal.     Palpations: Abdomen is soft.  Musculoskeletal: Normal range of motion.  Skin:    General: Skin is warm and dry.  Neurological:     Mental Status: He is oriented to person, place, and time.  Psychiatric:        Mood and Affect: Mood normal.        Behavior: Behavior normal.        Thought Content: Thought content normal.        Judgment: Judgment normal.     Imaging: Dg Chest 2 View  Result Date: 01/13/2019 CLINICAL DATA:  Weakness.  Pain in shoulders. EXAM: CHEST - 2 VIEW COMPARISON:  07/25/2018 FINDINGS: Cardiomegaly. Prior aortic valve repair. No confluent airspace opacity, effusion or overt edema. No acute bony abnormality. IMPRESSION: Cardiomegaly.  No active disease. Electronically Signed   By: Charlett Nose M.D.   On: 01/13/2019 16:53   Dg Shoulder Right  Result Date: 01/13/2019 CLINICAL DATA:  Right shoulder pain, weakness EXAM: RIGHT SHOULDER - 2+ VIEW COMPARISON:  None. FINDINGS: Early subacromial spurring and spurring at the rotator cuff insertion. Slight joint space narrowing in the glenohumeral joint. No acute bony abnormality. Specifically, no fracture, subluxation, or dislocation. IMPRESSION: Early arthritic changes in the right shoulder. No acute bony abnormality.  Electronically Signed   By: Charlett Nose M.D.   On: 01/13/2019 16:54   Ct Head Wo Contrast  Result Date: 01/20/2019 CLINICAL DATA:  68 y/o M; subarachnoid hemorrhage  and stroke for follow-up. EXAM: CT HEAD WITHOUT CONTRAST TECHNIQUE: Contiguous axial images were obtained from the base of the skull through the vertex without intravenous contrast. COMPARISON:  01/19/2019 MRI of the head. FINDINGS: Brain: Multiple foci of hypoattenuation are present within the cerebellum and scattered over the cerebral convexities and within periventricular white matter corresponding to areas of infarction on the prior MRI of the brain. Findings are stable given differences in technique. Stable small volume of subarachnoid hemorrhage within the left central sulcus. No new stroke, hemorrhage, mass effect, intracranial hemorrhage, or herniation identified. Vascular: Calcific atherosclerosis of carotid siphons. No hyperdense vessel identified. Skull: Negative for fracture or focal lesion. Stable right frontal region craniotomy chronic postsurgical changes. Sinuses/Orbits: Mucosal thickening of the left frontal sinus antrum and anterior ethmoid air cells. Additional visible paranasal sinuses and the mastoid air cells are normally aerated. Orbits are unremarkable. Other: None. IMPRESSION: 1. Multiple foci of infarction are present scattered throughout the supratentorial cortex, periventricular white matter, and throughout the cerebellum. The distribution of infarction is stable from prior MRI given differences in technique. 2. Stable small focus of acute subarachnoid hemorrhage within the left central sulcus. Additional areas of hemorrhage suggested on MRI are not visible on CT, possibly chronic hemosiderin deposition. 3. No new acute intracranial abnormality identified. Electronically Signed   By: Mitzi Hansen M.D.   On: 01/20/2019 18:40   Mr Maxine Glenn Head Wo Contrast  Result Date: 01/19/2019 CLINICAL DATA:  Right upper  extremity weakness EXAM: MRI HEAD WITHOUT CONTRAST MRA HEAD WITHOUT CONTRAST TECHNIQUE: Multiplanar, multiecho pulse sequences of the brain and surrounding structures were obtained without intravenous contrast. Angiographic images of the head were obtained using MRA technique without contrast. COMPARISON:  Head CT 10/26/2016 FINDINGS: MRI HEAD FINDINGS BRAIN: There is scattered multifocal diffusion restriction throughout the brain. The largest areas of diffusion abnormality are in the cerebellum. There are bilateral areas of ischemia in the deep watershed distribution. There are multiple bilateral peripheral subcortical foci of ischemia. Areas along the left postcentral gyrus and right anterior convexity are suggestive subarachnoid hemorrhage. There is also magnetic susceptibility effect in the left cerebellum, also likely indicating an area of hemorrhage. There is mild edema corresponding to the sites of diffusion abnormality. Mild generalized volume loss. No midline shift. SKULL AND UPPER CERVICAL SPINE: The visualized skull base, calvarium, upper cervical spine and extracranial soft tissues are normal. SINUSES/ORBITS: No fluid levels or advanced mucosal thickening. No mastoid or middle ear effusion. The orbits are normal. MRA HEAD FINDINGS POSTERIOR CIRCULATION: --Basilar artery: Normal. --Posterior cerebral arteries: Normal. The right PCA is partially supplied by the posterior communicating artery. --Superior cerebellar arteries: Normal. --Inferior cerebellar arteries: Normal left AICA. The posterior inferior cerebral arteries are normal. ANTERIOR CIRCULATION: --Intracranial internal carotid arteries: Normal. --Anterior cerebral arteries: Normal. Both A1 segments are present. Patent anterior communicating artery. --Middle cerebral arteries: Moderate narrowing of the M2 segments bilaterally. --Posterior communicating arteries: Present on the right, absent on the left. IMPRESSION: 1. Numerous scattered foci of  acute ischemia throughout the cerebrum and cerebellum. Many of these lesions are most suggestive of embolic infarcts, given the widespread distribution over multiple vascular territories. Others are in a deep watershed distribution, as is seen in the setting of hypoperfusion events. 2. Areas of magnetic susceptibility effect superimposed on diffusion abnormality along the left postcentral gyrus, right frontal convexity and lateral ventricular atria are suggestive subarachnoid blood. Noncontrast head CT recommended for clarification. 3. No midline shift or other mass effect. 4. No proximal intracranial  arterial occlusion. Moderate narrowing of the M2 segments of both middle cerebral arteries. Critical Value/emergent results were called by telephone at the time of interpretation on 01/19/2019 at 5:32 pm to Dr. Durward Mallard [facility] , who verbally acknowledged these results. Electronically Signed   By: Deatra Robinson M.D.   On: 01/19/2019 17:33   Mr Brain Wo Contrast  Result Date: 01/19/2019 CLINICAL DATA:  Right upper extremity weakness EXAM: MRI HEAD WITHOUT CONTRAST MRA HEAD WITHOUT CONTRAST TECHNIQUE: Multiplanar, multiecho pulse sequences of the brain and surrounding structures were obtained without intravenous contrast. Angiographic images of the head were obtained using MRA technique without contrast. COMPARISON:  Head CT 10/26/2016 FINDINGS: MRI HEAD FINDINGS BRAIN: There is scattered multifocal diffusion restriction throughout the brain. The largest areas of diffusion abnormality are in the cerebellum. There are bilateral areas of ischemia in the deep watershed distribution. There are multiple bilateral peripheral subcortical foci of ischemia. Areas along the left postcentral gyrus and right anterior convexity are suggestive subarachnoid hemorrhage. There is also magnetic susceptibility effect in the left cerebellum, also likely indicating an area of hemorrhage. There is mild edema corresponding to the sites of  diffusion abnormality. Mild generalized volume loss. No midline shift. SKULL AND UPPER CERVICAL SPINE: The visualized skull base, calvarium, upper cervical spine and extracranial soft tissues are normal. SINUSES/ORBITS: No fluid levels or advanced mucosal thickening. No mastoid or middle ear effusion. The orbits are normal. MRA HEAD FINDINGS POSTERIOR CIRCULATION: --Basilar artery: Normal. --Posterior cerebral arteries: Normal. The right PCA is partially supplied by the posterior communicating artery. --Superior cerebellar arteries: Normal. --Inferior cerebellar arteries: Normal left AICA. The posterior inferior cerebral arteries are normal. ANTERIOR CIRCULATION: --Intracranial internal carotid arteries: Normal. --Anterior cerebral arteries: Normal. Both A1 segments are present. Patent anterior communicating artery. --Middle cerebral arteries: Moderate narrowing of the M2 segments bilaterally. --Posterior communicating arteries: Present on the right, absent on the left. IMPRESSION: 1. Numerous scattered foci of acute ischemia throughout the cerebrum and cerebellum. Many of these lesions are most suggestive of embolic infarcts, given the widespread distribution over multiple vascular territories. Others are in a deep watershed distribution, as is seen in the setting of hypoperfusion events. 2. Areas of magnetic susceptibility effect superimposed on diffusion abnormality along the left postcentral gyrus, right frontal convexity and lateral ventricular atria are suggestive subarachnoid blood. Noncontrast head CT recommended for clarification. 3. No midline shift or other mass effect. 4. No proximal intracranial arterial occlusion. Moderate narrowing of the M2 segments of both middle cerebral arteries. Critical Value/emergent results were called by telephone at the time of interpretation on 01/19/2019 at 5:32 pm to Dr. Durward Mallard [facility] , who verbally acknowledged these results. Electronically Signed   By: Deatra Robinson  M.D.   On: 01/19/2019 17:33   Mr Cervical Spine Wo Contrast  Result Date: 01/19/2019 CLINICAL DATA:  Shoulder pain and progressive weakness EXAM: MRI CERVICAL SPINE WITHOUT CONTRAST TECHNIQUE: Multiplanar, multisequence MR imaging of the cervical spine was performed. No intravenous contrast was administered. COMPARISON:  None. FINDINGS: Alignment: Normal. Vertebrae: No focal marrow lesion. No compression fracture or evidence of discitis osteomyelitis. Cord: Normal caliber and signal. Posterior Fossa, vertebral arteries, paraspinal tissues: Visualized posterior fossa is normal. Vertebral artery flow voids are preserved. No prevertebral effusion. Disc levels: Sagittal imaging includes the atlantoaxial joint to the level of the T2-3 disc space, with axial imaging of the disc spaces from C2-3 to C6-7. Axial images are degraded by motion. Within that limitation, there is no spinal canal stenosis above the  C5 level. At C5-6, there is a small central disc protrusion that narrows the ventral thecal sac with mild spinal canal stenosis. At C6-7, there is a intermediate disc bulge causing mild spinal canal stenosis. There is poor visualization of the neural foramina because of motion. IMPRESSION: 1. Motion degraded examination, particularly limiting assessment of the neural foramina. 2. Mild spinal canal stenosis at C5-6 and C6-7. 3. No acute abnormality of the cervical spine. Electronically Signed   By: Deatra RobinsonKevin  Herman M.D.   On: 01/19/2019 17:45   [place] Venous Img Lower Unilateral Right  Result Date: 01/14/2019 CLINICAL DATA:  Right lower extremity edema and erythema for 1 day EXAM: RIGHT LOWER EXTREMITY VENOUS DUPLEX ULTRASOUND TECHNIQUE: Doppler venous assessment of the right lower extremity deep venous system was performed, including characterization of spectral flow, compressibility, and phasicity. COMPARISON:  None. FINDINGS: There is complete compressibility of the right common femoral, femoral, and popliteal veins.  Doppler analysis demonstrates respiratory phasicity and augmentation of flow with calf compression. No obvious superficial vein or calf vein thrombosis. IMPRESSION: No evidence of right lower extremity DVT. Electronically Signed   By: Jolaine ClickArthur  Hoss M.D.   On: 01/14/2019 13:57   [place] Venous Img Upper Uni Right  Result Date: 01/18/2019 CLINICAL DATA:  Right upper extremity edema. History of diabetes. Evaluate DVT. EXAM: RIGHT UPPER EXTREMITY VENOUS DOPPLER ULTRASOUND TECHNIQUE: Gray-scale sonography with graded compression, as well as color Doppler and duplex ultrasound were performed to evaluate the upper extremity deep venous system from the level of the subclavian vein and including the jugular, axillary, basilic, radial, ulnar and upper cephalic vein. Spectral Doppler was utilized to evaluate flow at rest and with distal augmentation maneuvers. COMPARISON:  None. FINDINGS: Contralateral Subclavian Vein: Respiratory phasicity is normal and symmetric with the symptomatic side. No evidence of thrombus. Normal compressibility. Internal Jugular Vein: No evidence of thrombus. Normal compressibility, respiratory phasicity and response to augmentation. Subclavian Vein: No evidence of thrombus. Normal compressibility, respiratory phasicity and response to augmentation. Axillary Vein: No evidence of thrombus. Normal compressibility, respiratory phasicity and response to augmentation. Cephalic Vein: No evidence of thrombus. Normal compressibility, respiratory phasicity and response to augmentation. Basilic Vein: No evidence of thrombus. Normal compressibility, respiratory phasicity and response to augmentation. Brachial Veins: No evidence of thrombus. Normal compressibility, respiratory phasicity and response to augmentation. Radial Veins: No evidence of thrombus. Normal compressibility, respiratory phasicity and response to augmentation. Ulnar Veins: No evidence of thrombus. Normal compressibility, respiratory phasicity  and response to augmentation. Venous Reflux:  None visualized. Other Findings:  None visualized. IMPRESSION: No evidence of DVT within the right upper extremity. Electronically Signed   By: Simonne ComeJohn  Watts M.D.   On: 01/18/2019 15:10   Dg Chest Port 1 View  Result Date: 01/21/2019 CLINICAL DATA:  Cough and leukocytosis. EXAM: PORTABLE CHEST 1 VIEW COMPARISON:  01/15/2019 FINDINGS: Marked cardiomegaly and aortic valve replacement again noted. Pulmonary vascular congestion identified. LEFT basilar opacity noted. No pleural effusion or pneumothorax. A LEFT PICC line is again identified with tip overlying the LOWER SVC. IMPRESSION: New LEFT basilar opacity, question atelectasis versus airspace disease/pneumonia. Marked cardiomegaly with pulmonary vascular congestion. Electronically Signed   By: Harmon PierJeffrey  Hu M.D.   On: 01/21/2019 11:37   Dg Chest Port 1 View  Result Date: 01/15/2019 CLINICAL DATA:  Bacteremia. EXAM: PORTABLE CHEST 1 VIEW COMPARISON:  01/13/2019 FINDINGS: Lungs are adequately inflated without focal airspace consolidation or effusion. There is moderate stable cardiomegaly. Remainder of the exam is unchanged. IMPRESSION: No acute cardiopulmonary  disease. Moderate stable cardiomegaly. Electronically Signed   By: Elberta Fortis M.D.   On: 01/15/2019 13:38   Dg Hip Unilat With Pelvis 2-3 Views Right  Result Date: 01/16/2019 CLINICAL DATA:  Right hip pain EXAM: DG HIP (WITH OR WITHOUT PELVIS) 2-3V RIGHT COMPARISON:  None. FINDINGS: Changes of right hip replacement. No hardware complicating feature. No acute bony abnormality. No fracture, subluxation or dislocation. IMPRESSION: Prior right hip replacement.  No acute bony abnormality. Electronically Signed   By: Charlett Nose M.D.   On: 01/16/2019 19:48   [place] Ekg Site Rite  Result Date: 01/18/2019 If [facility] image not attached, placement could not be confirmed due to current cardiac rhythm.  US Abdomen Limited Ruq  Result Date: 01/18/2019 CLINICAL  DATA:  Increased liver function tests. EXAM: ULTRASOUND ABDOMEN LIMITED RIGHT UPPER QUADRANT COMPARISON:  CT abdomen pelvis July 11, 2018 FINDINGS: Gallbladder: No gallstones or wall thickening visualized. The gallbladder is distended. No sonographic Murphy sign noted by sonographer. Common bile duct: Diameter: 5 mm Liver: No focal lesion identified. Within normal limits in parenchymal echogenicity. Nodular contour of the liver is noted. Portal vein is patent on color Doppler imaging with normal direction of blood flow towards the liver. IMPRESSION: No evidence of acute cholecystitis. Nodular contour of liver, this can be seen in cirrhosis of liver. Electronically Signed   By: Sherian Rein M.D.   On: 01/18/2019 12:22    Labs:  CBC: Recent Labs    01/19/19 0526 01/21/19 0538 01/21/19 2211 01/22/19 0217  WBC 17.3* 26.7* 25.1* 25.3*  HGB 10.0* 7.7* 7.5* 7.2*  HCT 29.8* 22.0* 20.9* 20.8*  PLT 224 260 234 216    COAGS: Recent Labs    07/09/18 0227 07/21/18 1019  INR 1.25 1.12  APTT  --  32    BMP: Recent Labs    01/18/19 0424 01/19/19 0526 01/21/19 0538 01/22/19 0217  NA 136 135 136 139  K 5.1 5.0 5.4* 5.5*  CL 112* 110 113* 115*  CO2 18* 19* 18* 15*  GLUCOSE 95 106* 154* 126*  BUN 51* 51* 57* 71*  CALCIUM 8.1* 8.0* 7.8* 7.5*  CREATININE 1.68* 1.60* 1.77* 1.87*  GFRNONAA 41* 44* 39* 36*  GFRAA 48* 51* 45* 42*    LIVER FUNCTION TESTS: Recent Labs    01/16/19 0427 01/17/19 0418 01/18/19 0424 01/19/19 0526  BILITOT 2.3* 1.6* 1.4* 1.3*  AST 38 30 26 26   ALT 28 24 15 7   ALKPHOS 96 105 105 102  PROT 6.2* 6.0* 6.1* 6.3*  ALBUMIN 2.4* 2.3* 2.1* 2.0*    TUMOR MARKERS: No results for input(s): AFPTM, CEA, CA199, CHROMGRNA in the last 8760 hours.  Assessment and Plan:  UGI bleed Hematemesis; hemorrhagic anemia EGD x 2: not great visualization secondary clot in stomach and low esophagus IR consulted for opinion on mesenteric arteriogram with possible  embolization See recommendations in note Will keep on IR Radar and look for Nephrology consult and GI plans    Thank you for this interesting consult.  I greatly enjoyed meeting SEVAN FINSETH and look forward to participating in their care.  A copy of this report was sent to the requesting provider on this date.  Electronically Signed: Robet Leu, PA-C 01/22/2019, 10:28 AM   I spent a total of 40 Minutes    in face to face in clinical consultation, greater than 50% of which was counseling/coordinating care for UGI bleed; IR recommendations

## 2019-01-22 NOTE — Progress Notes (Signed)
2nd unit of blood stopped at 1250 pm vitals taken

## 2019-01-22 NOTE — Interval H&P Note (Signed)
History and Physical Interval Note:  01/22/2019 8:34 AM  Joel Rogers  has presented today for surgery, with the diagnosis of upper gastrointestinal bleed  The various methods of treatment have been discussed with the patient and family. After consideration of risks, benefits and other options for treatment, the patient has consented to  Procedure(s): ESOPHAGOGASTRODUODENOSCOPY (EGD) WITH PROPOFOL (N/A) as a surgical intervention .  The patient's history has been reviewed, patient examined, no change in status, stable for surgery.  I have reviewed the patient's chart and labs.  Questions were answered to the patient's satisfaction.     Katy Fitch Shamikia Linskey

## 2019-01-22 NOTE — Progress Notes (Signed)
PROGRESS NOTE  Joel Rogers  WUJ:[id number]  DOB: 04/17/1951  DOA: 01/13/2019 PCP: [name], MD  Brief Admission Hx:  68 y/o male admitted to the hospital with sepsis and streptococcal bacteremia secondary to right lower extremity cellulitis.  He has been treated with intravenous antibiotics overall sepsis is improved.  Since he has a history of bioprosthetic aortic valve, TEE was performed that did not show any evidence of vegetations.  Since admission, has had persistent right-sided weakness.  His limited range of motion was initially felt to be related to a fall that he had prior to admission where pain was limiting his motion.  Since his weakness persisted, he underwent MRI brain that showed bilateral infarcts.  Transfered  to [facility] on 01/20/2019 for further work-up and neurology evaluation, the 01/21/2019, patient started to have coffee-ground emesis, acute blood loss anemia, seen by GI went for endoscopy 01/21/2019, and 01/22/2019, reports had significant amount of blood/blood clot in stomach and distal esophagus, where intervention could not be performed as very poor visualization.  Subjective: Patient reports generalized weakness, but otherwise denies any complaints, no further vomiting, no bowel movement , denies any chest pain, reports his weakness has been improving .  MDM/Assessment & Plan:  Upper  GI bleed -Patient with coffee-ground emesis, the 12/11/2018 morning, with significant drop of hemoglobin to 7.7, she denies any history of gastric ulcer or gastritis in the past, patient with history of gastric bypass 2008 for weight loss. -GI input greatly appreciated, status post EGD 01/21/2019, 01/22/2019, both significant for large amount of blood/blood clot in the stomach in the distal esophagus, with poor fistulization and prevention could not be performed. -Continue empirically with Protonix drip -Started on octreotide drip empirically as well, no history of esophageal varices,  alcohol abuse, nothing was visualized during endoscopy, but there was evidence of liver cirrhosis on previous CT he received last year. -recieved1 dose of vasopressin given some uremia. -Received 1 dose of Eliquis 01/20/2019, giving significant GI bleed I will give Kcentra -IR consulted for possible need for intervention, likely will need CTA of abdomen, so I have consulted renal given his creatinine was elevated at 1.87 today, please see discussion below at AKI. -General surgery consulted -Was transfused 2 units PRBC yesterday, despite that has decline in his hemoglobin, he will be transfused 3 units currently, will monitor H&H every 6 hours and transfuse frequently, keep n.p.o.    Acute blood loss anemia -In the setting of upper GI bleed, so far transfused 3  units PRBC, 2 more  units pending, monitor H&H closely .  Severe sepsis - Present on admission,  she is secondary to Streptococcus bacteremia and cellulitis  - His blood cultures positive for group C strep.  Being treated with Ancef, currently on Zosyn for aspiration pneumonia  Streptococcus bacteremia - source is secondary to right lower extremity cellulitis.  He did complain of some pain in his right shoulder, but this joint does not appear to be erythematous, tender, swollen.  No evidence of joint effusion.  Patient had 1 out of 2 positive blood cultures for group C streptococcus.  Repeat blood cultures on 2/24 have not shown any growth.  2D echocardiogram did not show any evidence of vegetations.  Since patient has bioprosthetic aortic valve, TEE performed on 2/27 did not show any evidence of vegetations.  PICC line has been placed and he will complete a 2-week course of antibiotics.  Aspiration pneumonia -No leukocytosis, chest x-ray significant for left lung base  opacity, most likely aspiration, Ancef has been changed to Zosyn .  Hyperkalemia -Has stopped his Aldactone, will give Kayexalate per rectum, started on Veltassa  AKI on  CKD stage III - Baseline creatinine is 1.2, gradually increasing, peaked at 1.87 today -All nephrotoxic medication, has stopped Aldactone -Continue with volume resuscitation, receiving blood, had low albumin so I am giving 25 g of albumin, continue with IV fluids, will start bicarb drip given bicarb of 15.  Acute CVA. -Patient with right-sided weakness, x-rays of right shoulder, right hip were unrevealing, seen to have right-sided weakness during hospital stay, MRI of the brain was obtained, which did show bilateral infarct. -Neurology input greatly appreciated, was felt to be secondary to embolic stroke, most likely holding Eliquis due to thrombocytopenia contributed to it, discussed with neurology, should still hold on resuming Eliquis, as well I had to stop aspirin today given his significant GI bleed. -Await further neurology recommendation when it is appropriate to resume Eliquis(visually there is some petechial hemorrhage in his stroke), and of course we will have to wait for GI evaluation and clearance before resuming full anticoagulation. -MRA head was unremarkable,  2D echo with a preserved EF, with no embolic source could be identified.   Cellulitis of RLE  - due to streptococcus infection, clinically looking better, [place] RLE negative for DVT.   Chronic atrial fibrillation -  He has been chronically taking Eliquis.  This was held on admission due to severe thrombocytopenia.  Hold again in the setting of active GI bleed. -Overall heart rate is controlled on Cardizem CD, but given soft blood pressure I have stopped it, and if it rate starts to increase will give digoxin or amiodarone.  Thrombocytopenia - patient was initially severely thrombocytopenic, felt secondary to sepsis.  Since being treated with antibiotics and sepsis improving, overall platelet count has been improving.  Status post TAVR for severe aortic stenosis -he is followed by Dr. Allyson Sabal.  He had been doing fairly well  since surgery.    Acute on chronic right shoulder pain - plain film x-rays only showed arthritic changes in the right shoulder.  Joint does not appear to be inflamed, tender or swollen.  Essential hypertension - blood pressures currently stable and controlled.  Follow.  Hyperbilirubinemia.  - Trending down with IV fluids since admission.  Right upper quadrant ultrasound is unremarkable.  Right arm swelling.  -  Venous Dopplers negative for DVT  DVT prophylaxis: SCDs,  Code Status: Full Family Communication: Sister-in-law at bedside, discussed with wife via phone Disposition Plan: Pending further work-up  Consultants:  Cardiology for [facility]EE at [facility]  Gastroenterology  general surgery  Interventional radiology  Nephrology  Procedures:  Endoscopy 01/21/2019, 01/22/2019, by [facility]  -Units PRBC transfusion  Antimicrobials:  Cefepime 2/22-2/24  Vancomycin 2/22 >> 2/25  Metronidazole 2/22 >2/13  Ancef 2/25 > 3/1  Zosyn 01/21/2019   Objective: Vitals:   01/22/19 0955 01/22/19 1000 01/22/19 1020 01/22/19 1030  BP: 107/63 (!) 107/54 114/71 114/71  Pulse: 81 81 84 75  Resp: 18 17 (!) 24 16  Temp:    98.6 F (37 C)  TempSrc:    Oral  SpO2: 100% 100% 100%   Weight:      Height:        Intake/Output Summary (Last 24 hours) at 01/22/2019 1055 Last data filed at 01/22/2019 0923 Gross per 24 hour  Intake 1722.53 ml  Output 1285 ml  Net 437.53 ml   Filed Weights   01/19/19  2106 01/20/19 0423 01/22/19 0802  Weight: 123.3 kg 128.1 kg 128.1 kg   Exam:  Awake alert x3, sitting in bed in no apparent distress, pleasant Good air entry bilaterally, no wheezing rales rhonchi Abdomen soft, nontender, nondistended, bowel sounds present Irregular irregular, no rubs murmurs or gallops Abdomen soft, nontender, nondistended, bowel sounds present Remedies with mild pedal edema, more significant in the right upper extremity, but no erythema on tenderness in the site of his  psoriasis on the right leg  Basic Metabolic Panel: Recent Labs  Lab 01/16/19 0427 01/17/19 0418 01/18/19 0424 01/19/19 0526 01/21/19 0538 01/22/19 0217  NA 139 138 136 135 136 139  K 4.7 4.9 5.1 5.0 5.4* 5.5*  CL 112* 112* 112* 110 113* 115*  CO2 19* 19* 18* 19* 18* 15*  GLUCOSE 92 100* 95 106* 154* 126*  BUN 53* 51* 51* 51* 57* 71*  CREATININE 1.50* 1.45* 1.68* 1.60* 1.77* 1.87*  CALCIUM 8.2* 8.2* 8.1* 8.0* 7.8* 7.5*  MG 3.0*  --   --   --   --   --    Liver Function Tests: Recent Labs  Lab 01/16/19 0427 01/17/19 0418 01/18/19 0424 01/19/19 0526  AST 38 30 26 26   ALT 28 24 15 7   ALKPHOS 96 105 105 102  BILITOT 2.3* 1.6* 1.4* 1.3*  PROT 6.2* 6.0* 6.1* 6.3*  ALBUMIN 2.4* 2.3* 2.1* 2.0*   No results for input(s): LIPASE, AMYLASE in the last 168 hours. No results for input(s): AMMONIA in the last 168 hours. CBC: Recent Labs  Lab 01/16/19 0427 01/17/19 0418 01/18/19 0424 01/19/19 0526 01/21/19 0538 01/21/19 2211 01/22/19 0217  WBC 26.2* 24.2* 20.9* 17.3* 26.7* 25.1* 25.3*  NEUTROABS 22.4* 19.4* 15.4* 12.5*  --   --   --   HGB 11.5* 11.0* 10.4* 10.0* 7.7* 7.5* 7.2*  HCT 33.8* 31.7* 30.4* 29.8* 22.0* 20.9* 20.8*  MCV 68.6* 69.7* 68.0* 70.3* 68.1* 70.8* 71.2*  PLT 78* 105* 153 224 260 234 216   Cardiac Enzymes: No results for input(s): CKTOTAL, CKMB, CKMBINDEX, TROPONINI in the last 168 hours. CBG (last 3)  Recent Labs    01/22/19 0038 01/22/19 0404 01/22/19 0832  GLUCAP 114* 112* 107*   Recent Results (from the past 240 hour(s))  Culture, blood (routine x 2)     Status: None   Collection Time: 01/13/19  3:51 PM  Result Value Ref Range Status   Specimen Description BLOOD LEFT ARM  Final   Special Requests   Final    BOTTLES DRAWN AEROBIC ONLY Blood Culture adequate volume   Culture   Final    NO GROWTH 5 DAYS Performed at [facility], [address] [place], [place] [address]    Report Status 01/18/2019 FINAL  Final  Urine culture     Status:  Abnormal   Collection Time: 01/13/19  5:40 PM  Result Value Ref Range Status   Specimen Description   Final    URINE, CATHETERIZED Performed at [name], [address] [place], [place] [address]    Special Requests   Final    NONE Performed at [facility], [address] [place], [place] [address]    Culture (A)  Final    <10,000 COLONIES/mL INSIGNIFICANT GROWTH Performed at [facility] [place] [facility], [address] [place], [place] [address]    Report Status 01/15/2019 FINAL  Final  Culture, blood (routine x 2)     Status: Abnormal   Collection Time: 01/13/19  7:47 PM  Result Value Ref Range Status   Specimen Description   Final    BLOOD LEFT ARM Performed at [facility], [address], [place], [name] [address]    Special Requests   Final    BOTTLES DRAWN AEROBIC AND ANAEROBIC Blood Culture adequate volume Performed at [facility], [address] [place], [name] [address]    Culture  Setup Time   Final    GRAM POSITIVE COCCI IN CHAINS Gram Stain Report Called to,Read Back By and Verified With: BOUDERANT,R @ 0516 ON 01/14/19 BY JUW GS DONE @ [facility] CRITICAL RESULT CALLED TO, READ BACK BY AND VERIFIED WITH: S. HALL, PHARMD ([facility]) AT 1010 ON 01/14/19 BY C. JESSUP, MLT. Performed at [facility], [address], [place], [name] [address]    Culture STREPTOCOCCUS GROUP C (A)  Final   Report Status 01/16/2019 FINAL  Final   Organism ID, Bacteria STREPTOCOCCUS GROUP C  Final      Susceptibility   Streptococcus group c - MIC*    CLINDAMYCIN <=0.25 SENSITIVE Sensitive     AMPICILLIN <=0.25 SENSITIVE Sensitive     ERYTHROMYCIN <=0.12 SENSITIVE Sensitive     VANCOMYCIN <=0.12 SENSITIVE Sensitive     CEFTRIAXONE <=0.12 SENSITIVE Sensitive     LEVOFLOXACIN 0.5 SENSITIVE Sensitive     PENICILLIN Value in next row Sensitive      SENSITIVE<=0.06    * STREPTOCOCCUS GROUP C  Blood Culture ID Panel (Reflexed)     Status: Abnormal   Collection Time: 01/13/19  7:47 PM  Result  Value Ref Range Status   Enterococcus species NOT DETECTED NOT DETECTED Final   Listeria monocytogenes NOT DETECTED NOT DETECTED Final   Staphylococcus species NOT DETECTED NOT DETECTED Final   Staphylococcus aureus (BCID) NOT DETECTED NOT DETECTED Final   Streptococcus species DETECTED (A) NOT DETECTED Final    Comment: Not Enterococcus species, Streptococcus agalactiae, Streptococcus pyogenes, or Streptococcus pneumoniae. CRITICAL RESULT CALLED TO, READ BACK BY AND VERIFIED WITH: S. HALL, PHARMD ([facility]) AT 1010 ON 01/14/19 BY C. JESSUP, MLT.    Streptococcus agalactiae NOT DETECTED NOT DETECTED Final   Streptococcus pneumoniae NOT DETECTED NOT DETECTED Final   Streptococcus pyogenes NOT DETECTED NOT DETECTED Final   Acinetobacter baumannii NOT DETECTED NOT DETECTED Final   Enterobacteriaceae species NOT DETECTED NOT DETECTED Final   Enterobacter cloacae complex NOT DETECTED NOT DETECTED Final   Escherichia coli NOT DETECTED NOT DETECTED Final   Klebsiella oxytoca NOT DETECTED NOT DETECTED Final   Klebsiella pneumoniae NOT DETECTED NOT DETECTED Final   Proteus species NOT DETECTED NOT DETECTED Final   Serratia marcescens NOT DETECTED NOT DETECTED Final   Haemophilus influenzae NOT DETECTED NOT DETECTED Final   Neisseria meningitidis NOT DETECTED NOT DETECTED Final   Pseudomonas aeruginosa NOT DETECTED NOT DETECTED Final   Candida albicans NOT DETECTED NOT DETECTED Final   Candida glabrata NOT DETECTED NOT DETECTED Final   Candida krusei NOT DETECTED NOT DETECTED Final   Candida parapsilosis NOT DETECTED NOT DETECTED Final   Candida tropicalis NOT DETECTED NOT DETECTED Final    Comment: Performed at [facility], [address], [place], [name] [address]  Culture, blood (Routine X 2) w Reflex to ID Panel     Status: None   Collection Time: 01/15/19  8:52 AM  Result Value Ref Range Status   Specimen Description BLOOD LEFT FOREARM  Final   Special Requests   Final    BOTTLES  DRAWN AEROBIC AND ANAEROBIC  Blood Culture adequate volume   Culture   Final    NO GROWTH 5 DAYS Performed at [facility], [address] [place], [name] [address]    Report Status 01/20/2019 FINAL  Final  Culture, blood (Routine X 2) w Reflex to ID Panel     Status: None   Collection Time: 01/15/19  9:05 AM  Result Value Ref Range Status   Specimen Description BLOOD LEFT HAND  Final   Special Requests   Final    BOTTLES DRAWN AEROBIC AND ANAEROBIC Blood Culture results may not be optimal due to an excessive volume of blood received in culture bottles   Culture   Final    NO GROWTH 5 DAYS Performed at [facility], [address], [place], [name] [address]    Report Status 01/20/2019 FINAL  Final     Studies: Ct Head Wo Contrast  Result Date: 01/20/2019 CLINICAL DATA:  69 y/o M; subarachnoid hemorrhage and stroke for follow-up. EXAM: CT HEAD WITHOUT CONTRAST TECHNIQUE: Contiguous axial images were obtained from the base of the skull through the vertex without intravenous contrast. COMPARISON:  01/19/2019 MRI of the head. FINDINGS: Brain: Multiple foci of hypoattenuation are present within the cerebellum and scattered over the cerebral convexities and within periventricular white matter corresponding to areas of infarction on the prior MRI of the brain. Findings are stable given differences in technique. Stable small volume of subarachnoid hemorrhage within the left central sulcus. No new stroke, hemorrhage, mass effect, intracranial hemorrhage, or herniation identified. Vascular: Calcific atherosclerosis of carotid siphons. No hyperdense vessel identified. Skull: Negative for fracture or focal lesion. Stable right frontal region craniotomy chronic postsurgical changes. Sinuses/Orbits: Mucosal thickening of the left frontal sinus antrum and anterior ethmoid air cells. Additional visible paranasal sinuses and the mastoid air cells are normally aerated. Orbits are unremarkable. Other: None.  IMPRESSION: 1. Multiple foci of infarction are present scattered throughout the supratentorial cortex, periventricular white matter, and throughout the cerebellum. The distribution of infarction is stable from prior MRI given differences in technique. 2. Stable small focus of acute subarachnoid hemorrhage within the left central sulcus. Additional areas of hemorrhage suggested on MRI are not visible on CT, possibly chronic hemosiderin deposition. 3. No new acute intracranial abnormality identified. Electronically Signed   By: Mitzi Hansen M.D.   On: 01/20/2019 18:40   Dg Chest Port 1 View  Result Date: 01/21/2019 CLINICAL DATA:  Cough and leukocytosis. EXAM: PORTABLE CHEST 1 VIEW COMPARISON:  01/15/2019 FINDINGS: Marked cardiomegaly and aortic valve replacement again noted. Pulmonary vascular congestion identified. LEFT basilar opacity noted. No pleural effusion or pneumothorax. A LEFT PICC line is again identified with tip overlying the LOWER SVC. IMPRESSION: New LEFT basilar opacity, question atelectasis versus airspace disease/pneumonia. Marked cardiomegaly with pulmonary vascular congestion. Electronically Signed   By: Harmon Pier M.D.   On: 01/21/2019 11:37   Scheduled Meds: .  stroke: mapping our early stages of recovery book   Does not apply Once  . sodium chloride   Intravenous Once  . sodium chloride   Intravenous Once  . sodium chloride   Intravenous Once  . sodium chloride   Intravenous Once  . atorvastatin  80 mg Oral q1800  . diltiazem  120 mg Oral Daily  . [START ON 01/24/2019] pantoprazole  40 mg Intravenous Q12H  . sodium chloride flush  10-40 mL Intracatheter Q12H  . sodium polystyrene  45 g Rectal Once  . sodium zirconium cyclosilicate  10 g Oral TID  Continuous Infusions: . albumin human    . calcium gluconate    . desmopressin (DDAVP) IV    . octreotide  (SANDOSTATIN)    IV infusion    . pantoprozole (PROTONIX) infusion 8 mg/hr (01/22/19 0443)  .  piperacillin-tazobactam (ZOSYN)  IV 3.375 g (01/22/19 0658)  .  sodium bicarbonate (isotonic) infusion in sterile water     Active Problems:   Obstructive sleep apnea   Chronic atrial fibrillation   HTN (hypertension)   Hyperlipemia   Status post gastric bypass for obesity   Vitamin D deficiency   History of Severe aortic stenosis   Varicose veins of right lower extremity with complications   Pulmonary hypertension, unspecified (HCC)   History of subdural hematoma   S/P TAVR (transcatheter aortic valve replacement)   Sepsis (HCC)   Acute renal failure (HCC)   Right shoulder pain   Acute CVA (cerebrovascular accident) (HCC)  Time spent: 45 minutes  Huey Bienenstock, MD [facility] 01/22/2019, 10:55 AM    LOS: 9 days

## 2019-01-22 NOTE — Progress Notes (Signed)
PT Cancellation Note  Patient Details Name: Joel Rogers MRN: [id number] DOB: 07-08-1951   Cancelled Treatment:    Reason Eval/Treat Not Completed: Medical issues which prohibited therapy Pt just back from EGD, holding PT per RN request as pt actively bleeding awaiting possible mesenteric arteriogram and possible embolization, hypotensive and likely transfer to ICU. Not medically stable at this time. Will follow up.   Blake Divine A Abdulla Pooley 01/22/2019, 11:10 AM Mylo Red, PT, DPT Acute Rehabilitation Services Pager [phone] Office [phone]

## 2019-01-22 NOTE — Evaluation (Signed)
SLP Cancellation Note  Patient Details Name: Joel Rogers MRN: [id number] DOB: 1951/09/07   Cancelled treatment:       Reason Eval/Treat Not Completed: Patient at procedure or test/unavailable(pt at endsocopy at this time, will continue efforts)   Chales Abrahams 01/22/2019, 8:25 AM  Donavan Burnet, MS [facility] SLP Acute Rehab Services Pager [phone] Office [phone]

## 2019-01-22 NOTE — Transfer of Care (Signed)
Immediate Anesthesia Transfer of Care Note  Patient: Joel Rogers  Procedure(s) Performed: ESOPHAGOGASTRODUODENOSCOPY (EGD) WITH PROPOFOL (N/A )  Patient Location: Endoscopy Unit  Anesthesia Type:MAC  Level of Consciousness: awake, alert , oriented and patient cooperative  Airway & Oxygen Therapy: Patient Spontanous Breathing and Patient connected to nasal cannula oxygen  Post-op Assessment: Report given to RN, Post -op Vital signs reviewed and stable and Patient moving all extremities X 4  Post vital signs: Reviewed and unstable  Last Vitals:  Vitals Value Taken Time  BP 101/58 01/22/2019  9:22 AM  Temp    Pulse 82 01/22/2019  9:24 AM  Resp 20 01/22/2019  9:24 AM  SpO2 100 % 01/22/2019  9:24 AM  Vitals shown include unvalidated device data.  Last Pain:  Vitals:   01/22/19 0909  TempSrc: Oral  PainSc: 0-No pain      Patients Stated Pain Goal: 7 (56/25/63 8937)  Complications: No apparent anesthesia complications and Pt is hypotensive post procedure, Dr Daiva Huge notified and at pt bedside, VSS after one albumin 5%, 250ML IV

## 2019-01-22 NOTE — Consult Note (Signed)
Reason for Consult:GI bleed Referring Physician: Dr D Randol Kern  Joel Rogers is an 68 y.o. male.    HPI: Patient is a 68 year old male with a history of diastolic congestive heart failure, aortic stenosis s/p TAVR, atrial fibrillation on anticoagulation, OSA, hypertension/pulmonary hypertension, who presented to [facility] on 01/13/2019 with sepsis right lower extremity, and Streptococcus bacteremia.  He was found to have a right-sided weakness, and MRI showed multiple bilateral acute ischemic infarctions. He also has a subarachnoid hemorrhage in the left sulcus.     TEE was negative for any evidence of vegetations.  He was transferred to [facility] for his acute CVA, on 01/20/2019.  Yesterday he developed hematemesis.  He is apparently been spitting blood but overnight.  He was, on both aspirin and Eliquis he has a history of gastric bypass 2009  In [place] and lost over 200 pounds with bypass.    He was seen by Dr. Vida Rigger  He underwent EGD yesterday.  Larynx was normal the upper third of the esophagus was normal.  There was clotted blood in the lower third of the esophagus and middle third of the esophagus.  Blood was also clotted every the entire stomach.  He is back in the endoscopy suite this a.m.  I do not have the results of that study so far but again he is still reported to be actively bleeding, and again no source could be identified.    We are being asked to see and follow with the patient for possible surgical intervention.  Medicine plans to contact radiology,  but with his elevated creatinine they are concerned is not a good candidate for angiography or embolization.   So far has been transfused with 3 units of packed cells, and another unit is being infused now.  Labs show a sodium of 139, potassium of 5.5, chloride of 115, CO2 15, glucose 126, BUN 71, creatinine 1.87.  WBC 25.3, hemoglobin 7.2, hematocrit 20.8, platelets are 216,000.  His last dose of Eliquis was  on 2/29, aspirin 325 mg 2/29.  He is hemodynamically stable.  Talking and comfortable in bed.  He is having no discomfort and not aware of bleeding, no further emesis.  Dr. Matthias Hughs saw him today and he is on Octreotide, PPI infusion, and currently being transfused.   Marland Kitchen albumin human    . calcium gluconate    . desmopressin (DDAVP) IV    . octreotide  (SANDOSTATIN)    IV infusion    . pantoprozole (PROTONIX) infusion 8 mg/hr (01/22/19 0443)  . piperacillin-tazobactam (ZOSYN)  IV 3.375 g (01/22/19 0658)  . prothrombin complex conc human (Kcentra) IVPB    .  sodium bicarbonate (isotonic) infusion in sterile water 100 mL/hr at 01/22/19 1101       Past Medical History:  Diagnosis Date  . Anemia    low iron  . Arthritis    bilateral knees  . Atrial fibrillation, chronic   . Chronic diastolic CHF (congestive heart failure) (HCC) 06/15/2018  . History of subdural hematoma   . Hyperlipidemia   . Hypertension   . Morbid obesity (HCC)   . Normal coronary arteries    by cardiac catheterization performed 03/14/06  . Obesity   . Pre-diabetes    pt denies  . S/P TAVR (transcatheter aortic valve replacement)    a. 07/25/18: Edwards Sapien 3 THV (size 26 mm, model # 9600TFX, serial # [id number]) by Dr. Laneta Simmers and Dr. Clifton James  . Sleep apnea  on CPAP  . Venous insufficiency     Past Surgical History:  Procedure Laterality Date  . CRANIOTOMY Right 08/27/2016   Procedure: CRANIOTOMY HEMATOMA EVACUATION SUBDURAL;  Surgeon: Maeola Harman, MD;  [facility];  Service: Neurosurgery;  Laterality: Right;  . GASTRIC BYPASS  09/2008  . JOINT REPLACEMENT  08/31/2006   right hip  . MULTIPLE EXTRACTIONS WITH ALVEOLOPLASTY N/A 07/13/2018   Procedure: Extraction of tooth #'s 3,8,10, 23-26, 30and 32 with alveoloplasty and gross debridement of remaining teeth;  Surgeon: Charlynne Pander, DDS;  Location: [facility] OR;  Service: Oral Surgery;  Laterality: N/A;  . RIGHT HEART CATH N/A 07/06/2018   Procedure: RIGHT  HEART CATH;  Surgeon: Kathleene Hazel, MD;  Location: [facility] INVASIVE CV LAB;  Service: Cardiovascular;  Laterality: N/A;  . RIGHT/LEFT HEART CATH AND CORONARY ANGIOGRAPHY N/A 07/10/2018   Procedure: RIGHT/LEFT HEART CATH AND CORONARY ANGIOGRAPHY;  Surgeon: Dolores Patty, MD;  Location: [facility] INVASIVE CV LAB;  Service: Cardiovascular;  Laterality: N/A;  . TEE WITHOUT CARDIOVERSION N/A 07/25/2018   Procedure: TRANSESOPHAGEAL ECHOCARDIOGRAM (TEE);  Surgeon: Kathleene Hazel, MD;  Location: [facility] OR;  Service: Open Heart Surgery;  Laterality: N/A;  . TEE WITHOUT CARDIOVERSION N/A 01/18/2019   Procedure: TRANSESOPHAGEAL ECHOCARDIOGRAM (TEE) WITH PROPOFOL;  Surgeon: Jonelle Sidle, MD;  Location: [facility] ORS;  Service: Cardiovascular;  Laterality: N/A;  . TOTAL HIP ARTHROPLASTY     right hip  . TRANSCATHETER AORTIC VALVE REPLACEMENT, TRANSFEMORAL  07/25/2018  . TRANSCATHETER AORTIC VALVE REPLACEMENT, TRANSFEMORAL N/A 07/25/2018   Procedure: TRANSCATHETER AORTIC VALVE REPLACEMENT, TRANSFEMORAL;  Surgeon: Kathleene Hazel, MD;  Location: [facility] OR;  Service: Open Heart Surgery;  Laterality: N/A;    Family History  Problem Relation Age of Onset  . Hypertension Father   . Cancer Father        prob prostate  . Alzheimer's disease Mother   . Alzheimer's disease Maternal Grandfather   . Breast cancer Sister   . Cancer Brother        "brain tumor"    Social History:  reports that he has quit smoking. He has never used smokeless tobacco. He reports that he does not drink alcohol or use drugs.  Allergies: No Known Allergies  Medications:  Prior to Admission:  Medications Prior to Admission  Medication Sig Dispense Refill Last Dose  . acetaminophen (TYLENOL) 650 MG CR tablet Take 650 mg by mouth 2 (two) times daily as needed for pain.   01/12/2019  . amoxicillin (AMOXIL) 500 MG capsule Take 2,000 mg (4 tablets) one hour prior to dental visits. 8 capsule 3 Past Month at Unknown time  .  apixaban (ELIQUIS) 5 MG TABS tablet Take 1 tablet (5 mg total) by mouth 2 (two) times daily. 180 tablet 1 01/12/2019 at 0900  . aspirin 81 MG chewable tablet Chew 1 tablet (81 mg total) by mouth daily.   01/12/2019  . atorvastatin (LIPITOR) 80 MG tablet TAKE 1 TABLET BY MOUTH ONCE DAILY WITH  BREAKFAST 90 tablet 1 01/12/2019  . colchicine 0.6 MG tablet TAKE 1 TABLET BY MOUTH TWICE DAILY 60 tablet 2 Past Month at Unknown time  . diltiazem (CARDIZEM CD) 120 MG 24 hr capsule Take 1 capsule (120 mg total) by mouth daily. 90 capsule 1 01/12/2019  . ergocalciferol (VITAMIN D2) 1.25 MG (50000 UT) capsule Take 50,000 Units by mouth once a week.   01/12/2019  . spironolactone (ALDACTONE) 25 MG tablet Take 0.5 tablets (12.5 mg total) by mouth  daily. 30 tablet 5 01/12/2019  . torsemide (DEMADEX) 20 MG tablet Take 40 mg by mouth as needed. Use as needed if weight is over 245 pounds.   01/12/2019   Scheduled: . [MAR Hold]  stroke: mapping our early stages of recovery book   Does not apply Once  . [MAR Hold] sodium chloride   Intravenous Once  . [MAR Hold] sodium chloride   Intravenous Once  . [MAR Hold] sodium chloride   Intravenous Once  . [MAR Hold] atorvastatin  80 mg Oral q1800  . [MAR Hold] diltiazem  120 mg Oral Daily  . [MAR Hold] pantoprazole  40 mg Intravenous Q12H  . [MAR Hold] sodium chloride flush  10-40 mL Intracatheter Q12H  . [MAR Hold] sodium polystyrene  45 g Rectal Once  . [MAR Hold] sodium zirconium cyclosilicate  10 g Oral TID   Continuous: . [MAR Hold] calcium gluconate    . desmopressin (DDAVP) IV    . octreotide  (SANDOSTATIN)    IV infusion    . pantoprozole (PROTONIX) infusion 8 mg/hr (01/22/19 0443)  . [MAR Hold] piperacillin-tazobactam (ZOSYN)  IV 3.375 g (01/22/19 0658)  .  sodium bicarbonate (isotonic) infusion in sterile water     Anti-infectives (From admission, onward)   Start     Dose/Rate Route Frequency Ordered Stop   01/21/19 1400  [MAR Hold]  piperacillin-tazobactam  (ZOSYN) IVPB 3.375 g     (MAR Hold since Mon 01/22/2019 at 0801. Reason: Transfer to a Procedural area.)   3.375 g 12.5 mL/hr over 240 Minutes Intravenous Every 8 hours 01/21/19 1224     01/16/19 2200  ceFAZolin (ANCEF) IVPB 2g/100 mL premix  Status:  Discontinued     2 g 200 mL/hr over 30 Minutes Intravenous Every 8 hours 01/16/19 1700 01/21/19 1205   01/16/19 1800  cefTRIAXone (ROCEPHIN) 2 g in sodium chloride 0.9 % 100 mL IVPB  Status:  Discontinued     2 g 200 mL/hr over 30 Minutes Intravenous Every 24 hours 01/16/19 1459 01/16/19 1650   01/14/19 1200  vancomycin (VANCOCIN) 1,500 mg in sodium chloride 0.9 % 500 mL IVPB  Status:  Discontinued     1,500 mg 250 mL/hr over 120 Minutes Intravenous Every 24 hours 01/14/19 0847 01/16/19 1459   01/14/19 1000  ceFEPIme (MAXIPIME) 1 g in sodium chloride 0.9 % 100 mL IVPB  Status:  Discontinued     1 g 200 mL/hr over 30 Minutes Intravenous Every 12 hours 01/14/19 0845 01/15/19 1040   01/13/19 2115  vancomycin (VANCOCIN) IVPB 1000 mg/200 mL premix     1,000 mg 200 mL/hr over 60 Minutes Intravenous  Once 01/13/19 2000 01/14/19 0701   01/13/19 2015  ceFEPIme (MAXIPIME) 2 g in sodium chloride 0.9 % 100 mL IVPB     2 g 200 mL/hr over 30 Minutes Intravenous  Once 01/13/19 2000 01/13/19 2205   01/13/19 2000  vancomycin (VANCOCIN) IVPB 1000 mg/200 mL premix     1,000 mg 200 mL/hr over 60 Minutes Intravenous  Once 01/13/19 2000 01/14/19 0701   01/13/19 1945  metroNIDAZOLE (FLAGYL) IVPB 500 mg     500 mg 100 mL/hr over 60 Minutes Intravenous Every 8 hours 01/13/19 1934 01/14/19 1338      Results for orders placed or performed during the hospital encounter of 01/13/19 (from the past 48 hour(s))  CBC     Status: Abnormal   Collection Time: 01/21/19  5:38 AM  Result Value Ref Range   WBC 26.7 (  H) 4.0 - 10.5 K/uL   RBC 3.23 (L) 4.22 - 5.81 MIL/uL   Hemoglobin 7.7 (L) 13.0 - 17.0 g/dL    Comment: Reticulocyte Hemoglobin testing may be clinically  indicated, consider ordering this additional test [id number]    HCT 22.0 (L) 39.0 - 52.0 %   MCV 68.1 (L) 80.0 - 100.0 fL   MCH 23.8 (L) 26.0 - 34.0 pg   MCHC 35.0 30.0 - 36.0 g/dL   RDW 30.8 65.7 - 84.6 %   Platelets 260 150 - 400 K/uL   nRBC 0.0 0.0 - 0.2 %    Comment: Performed at [facility] Lab, [address] [place], [name] [address]  Basic metabolic panel     Status: Abnormal   Collection Time: 01/21/19  5:38 AM  Result Value Ref Range   Sodium 136 135 - 145 mmol/L   Potassium 5.4 (H) 3.5 - 5.1 mmol/L   Chloride 113 (H) 98 - 111 mmol/L   CO2 18 (L) 22 - 32 mmol/L   Glucose, Bld 154 (H) 70 - 99 mg/dL   BUN 57 (H) 8 - 23 mg/dL   Creatinine, Ser 2.84 (H) 0.61 - 1.24 mg/dL   Calcium 7.8 (L) 8.9 - 10.3 mg/dL   GFR calc non Af Amer 39 (L) >60 mL/min   GFR calc Af Amer 45 (L) >60 mL/min   Anion gap 5 5 - 15    Comment: Performed at [facility] Lab, [address] [place], [name] [address]  Lipid panel     Status: Abnormal   Collection Time: 01/21/19  5:38 AM  Result Value Ref Range   Cholesterol 94 0 - 200 mg/dL   Triglycerides 67 <010 mg/dL   HDL 11 (L) >27 mg/dL   Total CHOL/HDL Ratio 8.5 RATIO   VLDL 13 0 - 40 mg/dL   LDL Cholesterol 70 0 - 99 mg/dL    Comment:        Total Cholesterol/HDL:CHD Risk Coronary Heart Disease Risk Table                     Men   Women  1/2 Average Risk   3.4   3.3  Average Risk       5.0   4.4  2 X Average Risk   9.6   7.1  3 X Average Risk  23.4   11.0        Use the calculated Patient Ratio above and the CHD Risk Table to determine the patient's CHD Risk.        ATP III CLASSIFICATION (LDL):  <100     mg/dL   Optimal  253-664  mg/dL   Near or Above                    Optimal  130-159  mg/dL   Borderline  403-474  mg/dL   High  >259     mg/dL   Very High Performed at [facility], [address], [place], [name] [address]   Prepare RBC     Status: None   Collection Time: 01/21/19  7:21 AM  Result Value Ref  Range   Order Confirmation      ORDER PROCESSED BY BLOOD BANK Performed at [facility], [address] [place], [name] [address]   Prepare RBC     Status: None   Collection Time: 01/21/19  1:28 PM  Result Value Ref Range  Order Confirmation      ORDER PROCESSED BY BLOOD BANK Performed at [facility], [address] [place], [place] [address]   Type and screen [facility]     Status: None (Preliminary result)   Collection Time: 01/21/19  1:40 PM  Result Value Ref Range   ABO/RH(D) O POS    Antibody Screen NEG    Sample Expiration 01/24/2019    Unit Number [id number]    Blood Component Type RED CELLS,LR    Unit division 00    Status of Unit ISSUED,FINAL    Transfusion Status OK TO TRANSFUSE    Crossmatch Result      Compatible Performed at [facility], [address] [place], [place] [address]    Unit Number [id number]    Blood Component Type RED CELLS,LR    Unit division 00    Status of Unit ISSUED,FINAL    Transfusion Status OK TO TRANSFUSE    Crossmatch Result Compatible    Unit Number [id number]    Blood Component Type RED CELLS,LR    Unit division 00    Status of Unit ALLOCATED    Transfusion Status OK TO TRANSFUSE    Crossmatch Result Compatible    Unit Number [id number]    Blood Component Type RED CELLS,LR    Unit division 00    Status of Unit ALLOCATED    Transfusion Status OK TO TRANSFUSE    Crossmatch Result Compatible   Glucose, capillary     Status: Abnormal   Collection Time: 01/21/19  8:47 PM  Result Value Ref Range   Glucose-Capillary 130 (H) 70 - 99 mg/dL   Comment 1 Notify RN    Comment 2 Document in Chart   CBC     Status: Abnormal   Collection Time: 01/21/19 10:11 PM  Result Value Ref Range   WBC 25.1 (H) 4.0 - 10.5 K/uL   RBC 2.95 (L) 4.22 - 5.81 MIL/uL   Hemoglobin 7.5 (L) 13.0 - 17.0 g/dL    Comment: Reticulocyte Hemoglobin testing may be clinically indicated, consider ordering this  additional test [id number]    HCT 20.9 (L) 39.0 - 52.0 %   MCV 70.8 (L) 80.0 - 100.0 fL   MCH 25.4 (L) 26.0 - 34.0 pg   MCHC 35.9 30.0 - 36.0 g/dL   RDW 38.717.7 (H) 56.411.5 - 33.215.5 %   Platelets 234 150 - 400 K/uL   nRBC 0.1 0.0 - 0.2 %    Comment: Performed at [facility], [address] [place], [place] [address]  Glucose, capillary     Status: Abnormal   Collection Time: 01/22/19 12:38 AM  Result Value Ref Range   Glucose-Capillary 114 (H) 70 - 99 mg/dL   Comment 1 Notify RN    Comment 2 Document in Chart   CBC     Status: Abnormal   Collection Time: 01/22/19  2:17 AM  Result Value Ref Range   WBC 25.3 (H) 4.0 - 10.5 K/uL   RBC 2.92 (L) 4.22 - 5.81 MIL/uL   Hemoglobin 7.2 (L) 13.0 - 17.0 g/dL    Comment: Reticulocyte Hemoglobin testing may be clinically indicated, consider ordering this additional test [id number]    HCT 20.8 (L) 39.0 - 52.0 %   MCV 71.2 (L) 80.0 - 100.0 fL   MCH 24.7 (L) 26.0 - 34.0 pg   MCHC 34.6 30.0 - 36.0 g/dL   RDW 16.017.3 (H) 10.911.5 - 32.315.5 %  Platelets 216 150 - 400 K/uL   nRBC 0.2 0.0 - 0.2 %    Comment: Performed at [facility], [address], [place], [name] [address]  Basic metabolic panel     Status: Abnormal   Collection Time: 01/22/19  2:17 AM  Result Value Ref Range   Sodium 139 135 - 145 mmol/L   Potassium 5.5 (H) 3.5 - 5.1 mmol/L   Chloride 115 (H) 98 - 111 mmol/L   CO2 15 (L) 22 - 32 mmol/L   Glucose, Bld 126 (H) 70 - 99 mg/dL   BUN 71 (H) 8 - 23 mg/dL   Creatinine, Ser 6.04 (H) 0.61 - 1.24 mg/dL   Calcium 7.5 (L) 8.9 - 10.3 mg/dL   GFR calc non Af Amer 36 (L) >60 mL/min   GFR calc Af Amer 42 (L) >60 mL/min   Anion gap 9 5 - 15    Comment: Performed at [facility], [address] [place], [name] [address]  Glucose, capillary     Status: Abnormal   Collection Time: 01/22/19  4:04 AM  Result Value Ref Range   Glucose-Capillary 112 (H) 70 - 99 mg/dL  Prepare RBC     Status: None   Collection Time: 01/22/19  7:12 AM   Result Value Ref Range   Order Confirmation      ORDER PROCESSED BY BLOOD BANK Performed at [facility], [address] [place], [name] [address]     Ct Head Wo Contrast  Result Date: 01/20/2019 CLINICAL DATA:  68 y/o M; subarachnoid hemorrhage and stroke for follow-up. EXAM: CT HEAD WITHOUT CONTRAST TECHNIQUE: Contiguous axial images were obtained from the base of the skull through the vertex without intravenous contrast. COMPARISON:  01/19/2019 MRI of the head. FINDINGS: Brain: Multiple foci of hypoattenuation are present within the cerebellum and scattered over the cerebral convexities and within periventricular white matter corresponding to areas of infarction on the prior MRI of the brain. Findings are stable given differences in technique. Stable small volume of subarachnoid hemorrhage within the left central sulcus. No new stroke, hemorrhage, mass effect, intracranial hemorrhage, or herniation identified. Vascular: Calcific atherosclerosis of carotid siphons. No hyperdense vessel identified. Skull: Negative for fracture or focal lesion. Stable right frontal region craniotomy chronic postsurgical changes. Sinuses/Orbits: Mucosal thickening of the left frontal sinus antrum and anterior ethmoid air cells. Additional visible paranasal sinuses and the mastoid air cells are normally aerated. Orbits are unremarkable. Other: None. IMPRESSION: 1. Multiple foci of infarction are present scattered throughout the supratentorial cortex, periventricular white matter, and throughout the cerebellum. The distribution of infarction is stable from prior MRI given differences in technique. 2. Stable small focus of acute subarachnoid hemorrhage within the left central sulcus. Additional areas of hemorrhage suggested on MRI are not visible on CT, possibly chronic hemosiderin deposition. 3. No new acute intracranial abnormality identified. Electronically Signed   By: Mitzi Hansen M.D.   On:  01/20/2019 18:40   Dg Chest Port 1 View  Result Date: 01/21/2019 CLINICAL DATA:  Cough and leukocytosis. EXAM: PORTABLE CHEST 1 VIEW COMPARISON:  01/15/2019 FINDINGS: Marked cardiomegaly and aortic valve replacement again noted. Pulmonary vascular congestion identified. LEFT basilar opacity noted. No pleural effusion or pneumothorax. A LEFT PICC line is again identified with tip overlying the LOWER SVC. IMPRESSION: New LEFT basilar opacity, question atelectasis versus airspace disease/pneumonia. Marked cardiomegaly with pulmonary vascular congestion. Electronically Signed   By: Harmon Pier M.D.   On: 01/21/2019  11:37    Review of Systems  Constitutional: Negative.   HENT: Negative.   Eyes: Negative.   Respiratory: Negative.   Cardiovascular: Negative.   Gastrointestinal: Positive for vomiting (earlier with bloody emesis reported). Negative for abdominal pain, blood in stool, constipation, diarrhea, heartburn, melena and nausea.  Genitourinary: Negative.   Musculoskeletal:       Right sided weakness  Skin: Negative.   Neurological: Negative.   Endo/Heme/Allergies: Bruises/bleeds easily.  Psychiatric/Behavioral: Negative.    Blood pressure (!) 87/34, pulse 95, temperature 98.3 F (36.8 C), temperature source Oral, resp. rate (!) 22, height 6\' 1"  (1.854 m), weight 128.1 kg, SpO2 100 %. Physical Exam  Constitutional: He is oriented to person, place, and time. He appears well-developed. No distress.  HENT:  Head: Normocephalic and atraumatic.  Mouth/Throat: Oropharynx is clear and moist. No oropharyngeal exudate.  Eyes: Right eye exhibits no discharge. Left eye exhibits no discharge. No scleral icterus.  Pupils are equal  Neck: Normal range of motion. Neck supple. JVD (some JVD) present. No tracheal deviation present. No thyromegaly present.  Cardiovascular: Normal rate, regular rhythm and intact distal pulses.  Murmur (aortic) heard. Respiratory: Effort normal and breath sounds normal.  No respiratory distress. He has no wheezes. He has no rales. He exhibits no tenderness.  GI: Soft. Bowel sounds are normal. He exhibits no distension and no mass. There is no abdominal tenderness. There is no rebound and no guarding.  Musculoskeletal:        General: Edema (trace) present. No tenderness.     Comments: RLE swelling much improved from admit picture  Lymphadenopathy:    He has no cervical adenopathy.  Neurological: He is alert and oriented to person, place, and time. No cranial nerve deficit.  Skin: Skin is warm and dry. No rash noted. He is not diaphoretic. No erythema. No pallor.  RLE cellulitis is better  Psychiatric: He has a normal mood and affect. His behavior is normal. Judgment and thought content normal.  Plans to go home and be with great grandchild.    Assessment/Plan: GI/stomach bleed - no source identified  - best guess is the anastomosis as ulcer site Hx gastric bypass with GI bleed Sepsis with streptococcal bacteremia New bilateral CVA - embolic/ subarachnoid bleed S/p TAVR 07/2018 AF - on Eliquis/Aspirin - stopped 01/20/19 Hypertension Hx OSA AKI  Plan:  Medical management for now, he is on Octreotide, and PPI drip.  Being transfused.  If he becomes worse we would need some type of imaging to localize the bleeding. We will follow with you.  Currently he is very hemodynamically stable.      Iness Pangilinan 01/22/2019, 9:22 AM

## 2019-01-22 NOTE — Anesthesia Procedure Notes (Signed)
Procedure Name: MAC Date/Time: 01/22/2019 8:42 AM Performed by: Carney Living, CRNA Pre-anesthesia Checklist: Patient identified, Emergency Drugs available, Suction available, Patient being monitored and Timeout performed Patient Re-evaluated:Patient Re-evaluated prior to induction Oxygen Delivery Method: Nasal cannula

## 2019-01-22 NOTE — Addendum Note (Signed)
Addendum  created 01/22/19 1020 by Rogelia Boga, CRNA   Child order released for a procedure order, Clinical Note Signed, Intraprocedure Blocks edited

## 2019-01-22 NOTE — Op Note (Signed)
[facility] Patient Name: Joel Rogers Procedure Date : 01/22/2019 MRN: [id number] Attending MD: Bernette Redbird , MD Date of Birth: July 30, 1951 CSN: [id number] Age: 68 Admit Type: Inpatient Procedure:                Upper GI endoscopy Indications:              Recent gastrointestinal bleeding (hematemesis) with                            severe post-hemorrhagic anemia--pt remotely s/p                            gastric bypass surgery, on Eliquis and ASA prior to                            admission, without PPI coverage. Providers:                Bernette Redbird, MD, Bonney Leitz, Harold Barban,                            RN, Kirt Boys, CRNA Referring MD:              Medicines:                Monitored Anesthesia Care Complications:            No immediate complications. Estimated Blood Loss:     Estimated blood loss: none. Procedure:                Pre-Anesthesia Assessment:                           - Prior to the procedure, a History and Physical                            was performed, and patient medications and                            allergies were reviewed. The patient's tolerance of                            previous anesthesia was also reviewed. The risks                            and benefits of the procedure and the sedation                            options and risks were discussed with the patient.                            All questions were answered, and informed consent                            was obtained. Prior Anticoagulants: The patient has  taken Eliquis (apixaban) and ASA, last dose was 2                            days prior to procedure. ASA Grade Assessment: III                            - A patient with severe systemic disease. After                            reviewing the risks and benefits, the patient was                            deemed in satisfactory condition to undergo the            procedure.                           After obtaining informed consent, the endoscope was                            passed under direct vision. Throughout the                            procedure, the patient's blood pressure, pulse, and                            oxygen saturations were monitored continuously. The                            GIF-H190 ([id number] Olympus gastroscope was                            introduced through the mouth, and advanced to the                            body of the stomach. The upper GI endoscopy was                            technically difficult and complex due to excessive                            retained blood, preventing visualization. The                            patient tolerated the procedure well. Scope In: Scope Out: Findings:      Red blood (medium red) was found in the lower third of the esophagus. No       ACTIVE bleeding was identified. There were also extensive mobile clots       refluxing up and down in the distal and mid-esophagus      The exam of the esophagus was otherwise grossly normal. Attempts were       made to suction out blood and I did get out about 150 mL of liquid dark       red blood, but there was still alot  of retained blood and clot that       could not be effectively suctioned.      There is no obvious endoscopic evidence of mass, stricture, ulcerations,       varices or Mallory-Weiss tear in the entire esophagus BUT such       abnormalities might have been present and obscured by the presence of       the blood and clots.      Clotted blood was found in the entire examined gastric remnant, almost       completely obscuring visualization. It is not clear that the small bowel       was Impression:               - Red blood in the lower third of the esophagus.                           - Clotted blood in the entire stomach.                           - No specimens collected. Recommendation:           - The  findings and recommendations were discussed                            with the patient's hospitalist physician.                           - Observe patient's clinical course with supportive                            care; consider IR consultation (although has high                            creatinine), Surgery consultation. Hopefully blood                            loss will resolve spontaneously as ASA effect gets                            out of his system. Could possibly try pre-operative                            gastric lavage with repeat egd BUT this would be                            somewhat hazardous given patient's postoperative                            anatomy, and in my experience is usually not                            effective in clearing the stomach. Procedure Code(s):        --- Professional ---                           [id number], 52, Esophagogastroduodenoscopy, flexible,  transoral; diagnostic, including collection of                            specimen(s) by brushing or washing, when performed                            (separate procedure) Diagnosis Code(s):        --- Professional ---                           K92.2, Gastrointestinal hemorrhage, unspecified CPT copyright 2018 American [employer] Association. All rights reserved. The codes documented in this report are preliminary and upon coder review may  be revised to meet current compliance requirements. Bernette Redbird, MD 01/22/2019 9:32:19 AM This report has been signed electronically. Number of Addenda: 0

## 2019-01-22 NOTE — Progress Notes (Signed)
PT Cancellation Note  Patient Details Name: Joel Rogers MRN: [id number] DOB: 02-05-1951   Cancelled Treatment:    Reason Eval/Treat Not Completed: Patient at procedure or test/unavailable Pt off floor at endo. Will follow up as time allows.   Blake Divine A Lars Jeziorski 01/22/2019, 8:42 AM Mylo Red, PT, DPT Acute Rehabilitation Services Pager [phone] Office [phone]

## 2019-01-22 NOTE — Progress Notes (Signed)
Inpatient Rehabilitation Admissions Coordinator  Note PT and OT have changed their recommendations form SNF to CIR on 01/20/2019. I recommend an inpt rehab consult order so that we can assess if pt would be a candidate for admit when medically ready. Please call me with any questions.  Ottie Glazier, RN, MSN Rehab Admissions Coordinator [phone] 01/22/2019 11:22 AM

## 2019-01-22 NOTE — Progress Notes (Addendum)
Results for Joel Rogers, Joel Rogers (MRN [id number]) as of 01/22/2019 03:05  Ref. Range 01/22/2019 02:17  Hemoglobin Latest Ref Range: 13.0 - 17.0 g/dL 7.2 (L)   Messaged [facility].  Hg 7.2 after 2 units PRBCs and bloody emesis from mouth 59ml at 00:01 01/22/19.

## 2019-01-22 NOTE — Progress Notes (Addendum)
STROKE TEAM PROGRESS NOTE   SUBJECTIVE (INTERVAL HISTORY) Family is at the bedside.  Patient just back from endoscopy.  Has acute GI bleed.  PRBCs infusing.   OBJECTIVE Vitals:   01/22/19 1030 01/22/19 1040 01/22/19 1057 01/22/19 1105  BP: 114/71 109/71 99/60 103/64  Pulse: 75 80 76 75  Resp: 16 19 16 17   Temp: 98.6 F (37 C) 98.7 F (37.1 C) 98.7 F (37.1 C)   TempSrc: Oral Oral Oral   SpO2:   100% 100%  Weight:      Height:        CBC:  Recent Labs  Lab 01/18/19 0424 01/19/19 0526  01/21/19 2211 01/22/19 0217  WBC 20.9* 17.3*   < > 25.1* 25.3*  NEUTROABS 15.4* 12.5*  --   --   --   HGB 10.4* 10.0*   < > 7.5* 7.2*  HCT 30.4* 29.8*   < > 20.9* 20.8*  MCV 68.0* 70.3*   < > 70.8* 71.2*  PLT 153 224   < > 234 216   < > = values in this interval not displayed.    Basic Metabolic Panel:  Recent Labs  Lab 01/16/19 0427  01/21/19 0538 01/22/19 0217  NA 139   < > 136 139  K 4.7   < > 5.4* 5.5*  CL 112*   < > 113* 115*  CO2 19*   < > 18* 15*  GLUCOSE 92   < > 154* 126*  BUN 53*   < > 57* 71*  CREATININE 1.50*   < > 1.77* 1.87*  CALCIUM 8.2*   < > 7.8* 7.5*  MG 3.0*  --   --   --    < > = values in this interval not displayed.    Lipid Panel:     Component Value Date/Time   CHOL 94 01/21/2019 0538   TRIG 67 01/21/2019 0538   HDL 11 (L) 01/21/2019 0538   CHOLHDL 8.5 01/21/2019 0538   VLDL 13 01/21/2019 0538   LDLCALC 70 01/21/2019 0538   LDLCALC 70 05/01/2018 0913   HgbA1c:  Lab Results  Component Value Date   HGBA1C 5.8 (H) 07/21/2018    IMAGING  Mr Maxine Glenn Head Wo Contrast 01/19/2019 1. Numerous scattered foci of acute ischemia throughout the cerebrum and cerebellum. Many of these lesions are most suggestive of embolic infarcts, given the widespread distribution over multiple vascular territories. Others are in a deep watershed distribution, as is seen in the setting of hypoperfusion events.  2. Areas of magnetic susceptibility effect superimposed on  diffusion abnormality along the left postcentral gyrus, right frontal convexity and lateral ventricular atria are suggestive subarachnoid blood. Noncontrast head CT recommended for clarification.  3. No midline shift or other mass effect.  4. No proximal intracranial arterial occlusion. Moderate narrowing of the M2 segments of both middle cerebral arteries.   Mr Cervical Spine Wo Contrast 01/19/2019 1. Motion degraded examination, particularly limiting assessment of the neural foramina.  2. Mild spinal canal stenosis at C5-6 and C6-7. 3. No acute abnormality of the cervical spine.   US Venous Img Upper Uni Right 01/18/2019 No evidence of DVT within the right upper extremity.   Transthoracic Echocardiogram  4/24//2020  1. The left ventricle has normal systolic function with an ejection fraction of 60-65%. The cavity size was normal. There is mildly increased left ventricular wall thickness. Left ventricular diastolic Doppler parameters are indeterminate. No evidence  of left ventricular regional wall motion abnormalities.  2. The right ventricle has moderately reduced systolic function. The cavity was mildly enlarged. There is no increase in right ventricular wall thickness. Right ventricular systolic pressure is moderately elevated with an estimated pressure of 41.7  mmHg.  3. Left atrial size was severely dilated.  4. Right atrial size was severely dilated.  5. The mitral valve is normal in structure. There is mild mitral annular calcification present.  6. The tricuspid valve is normal in structure.  7. A 26 Edwards Sapien bioprosthetic aortic valve (TAVR) valve is present in the aortic position. Normal function observed with mean gradient 15.7 mmHg. No perivalvular leak.  8. The pulmonic valve was grossly normal. Pulmonic valve regurgitation is mild by color flow Doppler.  9. The aortic root is normal in size and structure. 10. The inferior vena cava was dilated in size with <50%  respiratory variability. 11. No evidence of left ventricular regional wall motion abnormalities.  TEE 01/18/2019  1. The left ventricle has normal systolic function, with an ejection fraction of 60-65%. No evidence of left ventricular regional wall motion abnormalities.  2. No evidence of left ventricular regional wall motion abnormalities.  3. Left atrial size was severely dilated. No left atrial appendage thrombus noted.  4. Right atrial size was severely dilated. No right atrial appendage thrombus noted.  5. The mitral valve is normal in structure. There is mild to moderate mitral annular calcification present.  6. The tricuspid valve was normal in structure. Tricuspid valve regurgitation is mild-moderate.  7. A 26 Edwards Sapien bioprosthetic aortic valve (TAVR) valve is present in the aortic position.  8. The right ventricle has moderately reduced systolic function. The cavity was mildly enlarged.  9. There is evidence of mild plaque in the descending aorta. 10. No valvular vegetations are identified.  Bilateral Carotid Dopplers  07/10/2018 Right Carotid: Velocities in the right ICA are consistent with a 1-39% stenosis. Left Carotid: Velocities in the left ICA are consistent with a 1-39% stenosis. Vertebrals:  Bilateral vertebral arteries demonstrate antegrade flow. Subclavians: Normal flow hemodynamics were seen in bilateral subclavian arteries.    PHYSICAL EXAM  pleasant middle-aged African-American male currently not in distress. . Afebrile. Head is nontraumatic. Neck is supple without bruit.    Cardiac exam no murmur or gallop. Lungs are clear to auscultation. Distal pulses are well felt.  Neurological Exam :  Awake alert oriented 3 with normal speech and language function. No aphasia or apraxia dysarthria. Extraocular moments are full range without nystagmus. Blinks to threat bilaterally. Fundi not visualized. Face is symmetric without weakness. Tongue midline. Motor system exam  shows right hemiplegia with 3/5 strength with weakness of right grip, hip flexors and ankle dorsiflexors. Tone is slightly diminished on the right compared to the left. Sensation is intact. Plantars are downgoing. Gait not tested.   ASSESSMENT/PLAN [name] is a 68 y.o. male with history of atrial fibrillation on anticoagulation, CHF, history of subdural hematoma, HLD, HTN, TAVR placement in 9/19, sleep apnea and recent sepsis presenting with persistent right sided weakness and multifocal infarcts seen on MRI brain. He did not receive IV t-PA due to unknown time of onset.  Strokes:  Multifocal infarcts throughout the cerebrum and cerebellum - embolic from afib  MRI head - Numerous scattered foci of acute ischemia throughout the cerebrum and cerebellum.   MRA head  - unremarkable  Abdominal US - Nodular contour of liver, this can be seen in cirrhosis of liver.   Carotid Doppler - 07/10/2018 -  unremarkable  2D Echo / TEE  - EF 60 - 65%. No cardiac source of emboli identified. No vegetations.  TEE 2/27 did not show any evidence of vegetation   LDL - 70  HgbA1c - 5.8  UDS - not performed  VTE prophylaxis - SCDs  aspirin 81 mg daily and Eliquis (apixaban) daily prior to admission, had changed Eliquis to aspirin but now on no antithrombotic given GI bleed.  He is at high risk for stroke off of Eliquis, but unable to take anticoagulation d/t hmg.   Therapy recommendations:  pending  Disposition:  Pending  Chronic atrial Fibrillation  Home anticoagulation:  Eliquis (apixaban) daily   No anticoagulation at this time due to GI bleed  Hypertension Blood pressure somewhat low at times. Long-term BP goal normotensive  Hyperlipidemia  Lipid lowering medication PTA:  Lipitor 80 mg daily  LDL 70, goal < 70  Current lipid lowering medication: Lipitor 80 mg daily  Continue statin at discharge  Diabetes  HgbA1c 5.8, goal < 7.0  Controlled  Other Stroke Risk  Factors  Advanced age  Former cigarette smoker - quit  Obesity, Body mass index is 37.26 kg/m., recommend weight loss, diet and exercise as appropriate   Obstructive sleep apnea, on CPAP at home  Other Active Problems  Severe sepsis WBC 25.3  Acute GIB -with coffee-ground emesis, acute blood loss anemia.  Went for EGD [phone] with significant amount of blood clot in stomach and distal esophagus, unable to perform intervention.  Hemoglobin 7.2. Transfused.    Streptococcus bacteremia due to right lower extremity cellulitis  Acute home chronic kidney disease stage III - Creatinine 1.87  Cellulitis right lower extremity   Aspiration pneumonia   Hyperkalemia 5.5  US Abdomen Limited Ruq - Nodular contour of liver, this can be seen in cirrhosis of liver  Acute on chronic right shoulder pain  Hyperbilirubinemia  Right arm swelling Doppler negative for DVT  Hospital day # 9  Annie Main, MSN, APRN, ANVP-BC, AGPCNP-BC Advanced Practice Stroke Nurse [facility] See Amion for Schedule & Pager information 01/22/2019 3:28 PM  I have personally obtained history,examined this patient, reviewed notes, independently viewed imaging studies, participated in medical decision making and plan of care.ROS completed by me personally and pertinent positives fully documented  I have made any additions or clarifications directly to the above note. Agree with note above.the patient presents a challenging therapeutic dilemma. He clearly has had embolic strokes when eliquis was discontinued due to recent GI hemorrhage. He cannot go back on anticoagulation to the hemorrhage stops and the source is secured. Long discussion with the patient and family at the bedside and answered questions. Recommend repeat CT scan of the head tomorrow and if subarachnoid hemorrhage is improving may consider starting aspirin 81 mg if gastroenterology team is willing otherwise hold off. Greater than 50% time during  this 35 minute visit was spent on counseling and coordination of care about his embolic stroke and discussion about risk benefits of anticoagulation and antiplatelet therapy in the setting of acute GI hemorrhage. Discuss with Dr. Bertell Maria, MD Medical Director [facility] Pager: [phone] 01/22/2019 3:43 PM   To contact Stroke Continuity provider, please refer to [email or web address]. After hours, contact General Neurology

## 2019-01-23 ENCOUNTER — Inpatient Hospital Stay (HOSPITAL_COMMUNITY): Payer: Medicare Other

## 2019-01-23 DIAGNOSIS — I609 Nontraumatic subarachnoid hemorrhage, unspecified: Secondary | ICD-10-CM

## 2019-01-23 LAB — CBC
HCT: 25.4 % — ABNORMAL LOW (ref 39.0–52.0)
HCT: 26.7 % — ABNORMAL LOW (ref 39.0–52.0)
HCT: 26.8 % — ABNORMAL LOW (ref 39.0–52.0)
HEMOGLOBIN: 9 g/dL — AB (ref 13.0–17.0)
Hemoglobin: 8.6 g/dL — ABNORMAL LOW (ref 13.0–17.0)
Hemoglobin: 8.6 g/dL — ABNORMAL LOW (ref 13.0–17.0)
MCH: 26.4 pg (ref 26.0–34.0)
MCH: 25.7 pg — AB (ref 26.0–34.0)
MCH: 26.9 pg (ref 26.0–34.0)
MCHC: 33.9 g/dL (ref 30.0–36.0)
MCHC: 32.2 g/dL (ref 30.0–36.0)
MCHC: 33.6 g/dL (ref 30.0–36.0)
MCV: 77.9 fL — AB (ref 80.0–100.0)
MCV: 79.7 fL — AB (ref 80.0–100.0)
MCV: 80 fL (ref 80.0–100.0)
Platelets: 172 10*3/uL (ref 150–400)
Platelets: 181 10*3/uL (ref 150–400)
Platelets: 179 10*3/uL (ref 150–400)
RBC: 3.26 MIL/uL — ABNORMAL LOW (ref 4.22–5.81)
RBC: 3.35 MIL/uL — ABNORMAL LOW (ref 4.22–5.81)
RBC: 3.35 MIL/uL — AB (ref 4.22–5.81)
RDW: 18.8 % — ABNORMAL HIGH (ref 11.5–15.5)
RDW: 19.5 % — ABNORMAL HIGH (ref 11.5–15.5)
RDW: 20 % — ABNORMAL HIGH (ref 11.5–15.5)
WBC: 19.9 10*3/uL — ABNORMAL HIGH (ref 4.0–10.5)
WBC: 21.1 10*3/uL — ABNORMAL HIGH (ref 4.0–10.5)
WBC: 21.2 10*3/uL — ABNORMAL HIGH (ref 4.0–10.5)
nRBC: 1.2 % — ABNORMAL HIGH (ref 0.0–0.2)
nRBC: 1.4 % — ABNORMAL HIGH (ref 0.0–0.2)
nRBC: 1.3 % — ABNORMAL HIGH (ref 0.0–0.2)

## 2019-01-23 LAB — COMPREHENSIVE METABOLIC PANEL
ALT: 6 U/L (ref 0–44)
ANION GAP: 7 (ref 5–15)
AST: 71 U/L — ABNORMAL HIGH (ref 15–41)
Albumin: 2.1 g/dL — ABNORMAL LOW (ref 3.5–5.0)
Alkaline Phosphatase: 68 U/L (ref 38–126)
BUN: 72 mg/dL — ABNORMAL HIGH (ref 8–23)
CO2: 17 mmol/L — ABNORMAL LOW (ref 22–32)
Calcium: 8 mg/dL — ABNORMAL LOW (ref 8.9–10.3)
Chloride: 118 mmol/L — ABNORMAL HIGH (ref 98–111)
Creatinine, Ser: 1.95 mg/dL — ABNORMAL HIGH (ref 0.61–1.24)
GFR calc Af Amer: 40 mL/min — ABNORMAL LOW (ref >60)
GFR, EST NON AFRICAN AMERICAN: 34 mL/min — AB (ref >60)
Glucose, Bld: 119 mg/dL — ABNORMAL HIGH (ref 70–99)
Potassium: 4.7 mmol/L (ref 3.5–5.1)
Sodium: 142 mmol/L (ref 135–145)
Total Bilirubin: 0.9 mg/dL (ref 0.3–1.2)
Total Protein: 6.2 g/dL — ABNORMAL LOW (ref 6.5–8.1)

## 2019-01-23 LAB — PROTEIN / CREATININE RATIO, URINE
Creatinine, Urine: 61.76 mg/dL
Protein Creatinine Ratio: 2.67 mg/mg{Cre} — ABNORMAL HIGH (ref 0.00–0.15)
Total Protein, Urine: 165 mg/dL

## 2019-01-23 LAB — URINALYSIS, ROUTINE W REFLEX MICROSCOPIC
Bilirubin Urine: NEGATIVE
Glucose, UA: NEGATIVE mg/dL
Ketones, ur: NEGATIVE mg/dL
Leukocytes,Ua: NEGATIVE
NITRITE: NEGATIVE
PROTEIN: 100 mg/dL — AB
Specific Gravity, Urine: 1.02 (ref 1.005–1.030)
pH: 5 (ref 5.0–8.0)

## 2019-01-23 LAB — NA AND K (SODIUM & POTASSIUM), RAND UR
Potassium Urine: 46 mmol/L
Sodium, Ur: 34 mmol/L

## 2019-01-23 MED ORDER — DEXTROSE-NACL 5-0.2 % IV SOLN
INTRAVENOUS | Status: DC
  Administered 2019-01-23: 11:00:00 via INTRAVENOUS

## 2019-01-23 MED ORDER — OXYCODONE HCL 5 MG PO TABS: 5.0000 mg | ORAL_TABLET | ORAL | Status: DC | PRN

## 2019-01-23 MED ORDER — ACETAMINOPHEN 325 MG PO TABS: 650.0000 mg | ORAL_TABLET | Freq: Four times a day (QID) | ORAL | Status: DC | PRN

## 2019-01-23 MED ORDER — SODIUM BICARBONATE 8.4 % IV SOLN
INTRAVENOUS | Status: DC
  Administered 2019-01-23 – 2019-01-24 (×2): via INTRAVENOUS
  Filled 2019-01-23 (×4): qty 150

## 2019-01-23 NOTE — Progress Notes (Addendum)
Occupational Therapy Treatment Patient Details Name: Joel Rogers MRN: [id number] DOB: 04-24-1951 Today's Date: 01/23/2019    History of present illness Pt is a 68 y/o male admitted secondary to R UE and R LE pain with sepsis. During admission, pt with persistent R sided weakness. An MRI of the brain revealed multiple acute ischemic infarcts in a pattern that is most consistent with cardioembolic strokes. PMH including but not limited to atrial fibrillation on anticoagulation, CHF, history of subdural hematoma, HLD, HTN, TAVR placement in 9/19 and sleep apnea.   OT comments  Pt presents supine in bed pleasant and willing to participate in therapy session. Pt continues to have limitations due to pain in RLE, he does demonstrate improved tolerance to sitting EOB and able to maintain static balance initially requiring less assist compared to previous session (maintaining with close minguard-minA); once fatigued pt requiring up to maxA for stability. Use of maxisky for transfer OOB to recliner this session. He continues to require max-totalA (+2) for LB ADL; setup assist for simple grooming ADL and self-feeding (ice chips only). Will continue to follow acutely to progress pt towards established OT goals. Continue per POC.   Follow Up Recommendations  CIR;Supervision/Assistance - 24 hour    Equipment Recommendations  Other (comment)(TBD in next venue)          Precautions / Restrictions Precautions Precautions: Fall Restrictions Weight Bearing Restrictions: No       Mobility Bed Mobility Overal bed mobility: Needs Assistance Bed Mobility: Supine to Sit;Sit to Supine     Supine to sit: Max assist;+2 for physical assistance;+2 for safety/equipment;HOB elevated Sit to supine: +2 for physical assistance;Total assist   General bed mobility comments: increased time and effort, heavy assistance needed for R LE movement off of bed, heavy assistance with trunk management  Transfers Overall  transfer level: Needs assistance               General transfer comment: deferred attempt to stand from EOB due to pt with increased pain in RLE with ROM while sitting EOB; returned to supine and use of maxisky for totalA lift to chair    Balance Overall balance assessment: Needs assistance Sitting-balance support: Feet supported;Single extremity supported Sitting balance-Leahy Scale: Fair Sitting balance - Comments: pt with improved static sitting balance today compared to previous session, able to maintain with close minguard-minA for increased period of time (approx 3-5 min), once pt having increased pain levels and when fatigued he requires up to maxA to maintain upright sitting posture                                    ADL either performed or assessed with clinical judgement   ADL Overall ADL's : Needs assistance/impaired Eating/Feeding: NPO Eating/Feeding Details (indicate cue type and reason): except ice chips, setup to place cup in hand   Grooming Details (indicate cue type and reason): pt with assist to apply lotion to back             Lower Body Dressing: +2 for physical assistance;+2 for safety/equipment;Sitting/lateral leans;Maximal assistance;Total assistance Lower Body Dressing Details (indicate cue type and reason): assist to don socks at bed level             Functional mobility during ADLs: Maximal assistance;Total assistance;+2 for physical assistance;+2 for safety/equipment General ADL Comments: pt able to sit EOB today and then use of maxisky for OOB to  chair today, continues to have increased RLE pain (mostly in hip region), also with some limitations due to nausea     Vision       Perception     Praxis      Cognition Arousal/Alertness: Awake/alert Behavior During Therapy: WFL for tasks assessed/performed Overall Cognitive Status: Within Functional Limits for tasks assessed                                           Exercises     Shoulder Instructions       General Comments pt son present during session, VSS overall     Pertinent Vitals/ Pain       Pain Assessment: Faces Faces Pain Scale: Hurts even more Pain Location: R hip Pain Descriptors / Indicators: Guarding;Sore  Home Living                                          Prior Functioning/Environment              Frequency  Min 2X/week        Progress Toward Goals  OT Goals(current goals can now be found in the care plan section)  Progress towards OT goals: Progressing toward goals  Acute Rehab OT Goals Patient Stated Goal: return home after rehab OT Goal Formulation: With patient Time For Goal Achievement: 02/03/19 Potential to Achieve Goals: Good ADL Goals Pt Will Perform Grooming: sitting;with min guard assist Pt Will Perform Upper Body Bathing: with min guard assist;sitting Pt Will Perform Upper Body Dressing: with min guard assist;sitting Pt/caregiver will Perform Home Exercise Program: Increased strength;Increased ROM;Right Upper extremity;With minimal assist Additional ADL Goal #1: Pt will perform bed mobility with modA as precursor to EOB/OOB ADL. Additional ADL Goal #2: Pt will maintain sitting balance EOB with minguard assist during ADL/functional task.  Plan Discharge plan remains appropriate    Co-evaluation    PT/OT/SLP Co-Evaluation/Treatment: Yes Reason for Co-Treatment: For patient/therapist safety;To address functional/ADL transfers   OT goals addressed during session: ADL's and self-care;Strengthening/ROM      AM-PAC OT "6 Clicks" Daily Activity     Outcome Measure   Help from another person eating meals?: A Little Help from another person taking care of personal grooming?: A Little Help from another person toileting, which includes using toliet, bedpan, or urinal?: Total Help from another person bathing (including washing, rinsing, drying)?: A Lot Help from another  person to put on and taking off regular upper body clothing?: A Lot Help from another person to put on and taking off regular lower body clothing?: Total 6 Click Score: 12    End of Session Equipment Utilized During Treatment: Other (comment)(maxisky)  OT Visit Diagnosis: Muscle weakness (generalized) (M62.81);Other abnormalities of gait and mobility (R26.89);Pain Pain - Right/Left: Right Pain - part of body: Hip   Activity Tolerance Patient tolerated treatment well   Patient Left with call bell/phone within reach;with family/visitor present;in chair;with nursing/sitter in room   Nurse Communication Mobility status;Need for lift equipment        Time: 1132-1212 OT Time Calculation (min): 40 min  Charges: OT General Charges $OT Visit: 1 Visit OT Treatments $Self Care/Home Management : 8-22 mins  Marcy Siren, OT Supplemental Rehabilitation Services Pager [phone] Office [phone]    Kaleen Odea L  Orvan Falconer 01/23/2019, 1:50 PM

## 2019-01-23 NOTE — Evaluation (Signed)
Speech Language Pathology Evaluation Patient Details Name: Joel Rogers MRN: [id number] DOB: 1951/10/08 Today's Date: 01/23/2019 Time: 1537-1600 SLP Time Calculation (min) (ACUTE ONLY): 23 min  Problem List:  Patient Active Problem List   Diagnosis Date Noted  . Acute CVA (cerebrovascular accident) (HCC) 01/19/2019  . Acute renal failure (HCC) 01/14/2019  . Right shoulder pain 01/14/2019  . Sepsis (HCC) 01/13/2019  . S/P TAVR (transcatheter aortic valve replacement) 07/25/2018  . History of subdural hematoma   . Atrial fibrillation, chronic   . Acute on chronic diastolic heart failure (HCC) 06/15/2018  . Pulmonary hypertension, unspecified (HCC) 02/15/2018  . Varicose veins of right lower extremity with complications 01/10/2018  . History of Severe aortic stenosis 08/09/2017  . Vitamin D deficiency 12/02/2016  . Status post gastric bypass for obesity 11/02/2016  . HTN (hypertension) 04/13/2013  . Hyperlipemia 04/13/2013  . Chronic atrial fibrillation 02/14/2013  . Morbid obesity (HCC) 06/18/2008  . Obstructive sleep apnea 06/18/2008   Past Medical History:  Past Medical History:  Diagnosis Date  . Anemia    low iron  . Arthritis    bilateral knees  . Atrial fibrillation, chronic   . Chronic diastolic CHF (congestive heart failure) (HCC) 06/15/2018  . History of subdural hematoma   . Hyperlipidemia   . Hypertension   . Morbid obesity (HCC)   . Normal coronary arteries    by cardiac catheterization performed 03/14/06  . Pre-diabetes    pt denies  . S/P TAVR (transcatheter aortic valve replacement)    a. 07/25/18: Edwards Sapien 3 THV (size 26 mm, model # 9600TFX, serial # [id number]) by Dr. Laneta Simmers and Dr. Clifton James  . Sleep apnea    on CPAP  . Venous insufficiency    Past Surgical History:  Past Surgical History:  Procedure Laterality Date  . CRANIOTOMY Right 08/27/2016   Procedure: CRANIOTOMY HEMATOMA EVACUATION SUBDURAL;  Surgeon: Maeola Harman, MD;  Location: [facility] OR;   Service: Neurosurgery;  Laterality: Right;  . ESOPHAGOGASTRODUODENOSCOPY N/A 01/21/2019   Procedure: ESOPHAGOGASTRODUODENOSCOPY (EGD);  Surgeon: Vida Rigger, MD;  Location: [facility] ENDOSCOPY;  Service: Endoscopy;  Laterality: N/A;  . ESOPHAGOGASTRODUODENOSCOPY (EGD) WITH PROPOFOL N/A 01/22/2019   Procedure: ESOPHAGOGASTRODUODENOSCOPY (EGD) WITH PROPOFOL;  Surgeon: Bernette Redbird, MD;  Location: [facility] ENDOSCOPY;  Service: Endoscopy;  Laterality: N/A;  . GASTRIC BYPASS  09/2008  . JOINT REPLACEMENT  08/31/2006   right hip  . MULTIPLE EXTRACTIONS WITH ALVEOLOPLASTY N/A 07/13/2018   Procedure: Extraction of tooth #'s 3,8,10, 23-26, 30and 32 with alveoloplasty and gross debridement of remaining teeth;  Surgeon: Charlynne Pander, DDS;  Location: [facility] OR;  Service: Oral Surgery;  Laterality: N/A;  . RIGHT HEART CATH N/A 07/06/2018   Procedure: RIGHT HEART CATH;  Surgeon: Kathleene Hazel, MD;  Location: [facility] INVASIVE CV LAB;  Service: Cardiovascular;  Laterality: N/A;  . RIGHT/LEFT HEART CATH AND CORONARY ANGIOGRAPHY N/A 07/10/2018   Procedure: RIGHT/LEFT HEART CATH AND CORONARY ANGIOGRAPHY;  Surgeon: Dolores Patty, MD;  Location: [facility] INVASIVE CV LAB;  Service: Cardiovascular;  Laterality: N/A;  . TEE WITHOUT CARDIOVERSION N/A 07/25/2018   Procedure: TRANSESOPHAGEAL ECHOCARDIOGRAM (TEE);  Surgeon: Kathleene Hazel, MD;  Location: [facility] OR;  Service: Open Heart Surgery;  Laterality: N/A;  . TEE WITHOUT CARDIOVERSION N/A 01/18/2019   Procedure: TRANSESOPHAGEAL ECHOCARDIOGRAM (TEE) WITH PROPOFOL;  Surgeon: Jonelle Sidle, MD;  Location: [facility] ORS;  Service: Cardiovascular;  Laterality: N/A;  . TOTAL HIP ARTHROPLASTY     right hip  . TRANSCATHETER AORTIC VALVE  REPLACEMENT, TRANSFEMORAL  07/25/2018  . TRANSCATHETER AORTIC VALVE REPLACEMENT, TRANSFEMORAL N/A 07/25/2018   Procedure: TRANSCATHETER AORTIC VALVE REPLACEMENT, TRANSFEMORAL;  Surgeon: Kathleene Hazel, MD;  Location: [facility] OR;  Service: Open Heart  Surgery;  Laterality: N/A;   HPI:  Pt is a 68 y/o male admitted 01/13/19 (originally to [facility]) secondary to R UE and R LE pain with sepsis due to streptococcus bacteremia and cellulitis. During admission, pt with persistent R sided weakness. An MRI of the brain revealed multiple acute ischemic infarcts in a pattern that is most consistent with cardioembolic strokes and pt was transferred to [facility] for further care.  Pt's stay further complicated by hematemesis who underwent upper GI endoscopy on 01/21/19 and 2 units of PRBCs due to ABLA.  PMH including but not limited to atrial fibrillation on anticoagulation, CHF, history of subdural hematoma, HLD, HTN, TAVR placement in 9/19 and sleep apnea.   Assessment / Plan / Recommendation Clinical Impression  Pt has decreased storage and recall of new information, given Mod cues to recall 3/5 words on delayed recall trials. He is oriented x3 but is only very broadly oriented to situation - question his understanding of hospital events so far. He has decreased word-finding in conversation and topic maintenance, suspect related to reduced selective attention and thought organization. Attempted tasks to address simple verbal problem solving and anticipatory awareness, for which pt needed Mod verbal cues. Pt says the above impairments are new, and that he is typically independent at baseline. Recommend additional SLP f/u to maximize independence and safety.    SLP Assessment  SLP Recommendation/Assessment: Patient needs continued Speech Lanaguage Pathology Services SLP Visit Diagnosis: Cognitive communication deficit (R41.841)    Follow Up Recommendations  Inpatient Rehab    Frequency and Duration min 2x/week  2 weeks      SLP Evaluation Cognition  Overall Cognitive Status: Impaired/Different from baseline Arousal/Alertness: Awake/alert Orientation Level: Oriented to person;Oriented to place;Oriented to time;Disoriented to  situation Attention: Selective Selective Attention: Impaired Selective Attention Impairment: Verbal basic Memory: Impaired Memory Impairment: Storage deficit;Retrieval deficit;Decreased recall of new information Awareness: Impaired Awareness Impairment: Anticipatory impairment       Comprehension  Auditory Comprehension Overall Auditory Comprehension: Appears within functional limits for tasks assessed    Expression Expression Primary Mode of Expression: Verbal Verbal Expression Overall Verbal Expression: Impaired Initiation: No impairment Level of Generative/Spontaneous Verbalization: Conversation Repetition: No impairment Naming: Impairment Verbal Errors: Other (comment)(word-finding errors in conversation) Pragmatics: Impairment Impairments: Topic maintenance Interfering Components: Attention   Oral / Motor  Motor Speech Overall Motor Speech: Appears within functional limits for tasks assessed   GO                    Virl Axe Georgean Spainhower 01/23/2019, 4:15 PM  Ivar Drape, M.A. CCC-SLP Acute Herbalist [phone]

## 2019-01-23 NOTE — Consult Note (Signed)
[facility] Nephrology Consultation Note  Requesting MD: Dr. Bernette Redbird Reason for consult: AKI  HPI:  Joel Rogers is a 68 y.o. male.  History of chronic atrial fibrillation, hypertension, hyperlipidemia, gastric bypass, history of TAVR, who was initially admitted to [facility] with sepsis and streptococcal bacteremia due to right lower extremity cellulitis.  He was initially treated with IV antibiotics.  TEE was negative for vegetation.  During hospitalization patient developed right-sided weakness and MRI was consistent with acute stroke.  He was transferred to: On 2/29 for neurology consult.  On 3/1 he developed upper GI bleed and severe anemia.  He underwent endoscopy which was consistent with large amount of blood and clot but unable to locate the source of bleeding.  Patient was transferred to ICU for close monitoring.  He received about 6 unit of red blood cell transfusion.  Eliquis discontinued.  He was noticed to have gradually worsening serum creatinine level and in light of requiring possible angiography nephrology was consulted for further evaluation.  He has baseline serum creatinine level is around 1.1-1.23 and the creatinine level was 1.55 on admission at [facility].  The serum creatinine level increasing to 1.95 today, potassium 4.7, CO2 17.  He is nonoliguric with urine output of 1.4 L in 24 hours.  He has external urinary catheter.  Urinalysis during admission with RBCs, bacteria and protein but no WBC.  The hemoglobin was dropped to as low as 7.2.  After multiple units of blood transfusion hemoglobin level 8.6 today.  He is currently on Protonix, octreotide per GI.  Patient has no further bleeding today.  He reports some nausea and vomiting but denied headache, dizziness, chest pain, shortness of breath.  He has no lower extremity edema.  He is currently receiving D5 with hypotonic saline.  His home medication listed as torsemide, Aldactone, diltiazem Eliquis and  aspirin.  He is on IV Zosyn for sepsis.  No IV contrast use.  No use of NSAIDs.  Creat  Date/Time Value Ref Range Status  11/01/2018 12:06 PM 1.23 0.70 - 1.25 mg/dL Final    Comment:    For patients >19 years of age, the reference limit for Creatinine is approximately 13% higher for people identified as African-American. .   05/01/2018 09:13 AM 1.04 0.70 - 1.25 mg/dL Final    Comment:    For patients >11 years of age, the reference limit for Creatinine is approximately 13% higher for people identified as African-American. Marland Kitchen   12/13/2017 10:30 AM 1.19 0.70 - 1.25 mg/dL Final    Comment:    For patients >58 years of age, the reference limit for Creatinine is approximately 13% higher for people identified as African-American. .   09/09/2017 01:33 PM 1.24 0.70 - 1.25 mg/dL Final    Comment:    For patients >74 years of age, the reference limit for Creatinine is approximately 13% higher for people identified as African-American. .    Creatinine, Ser  Date/Time Value Ref Range Status  01/23/2019 03:54 AM 1.95 (H) 0.61 - 1.24 mg/dL Final  38/08/1750 02:58 AM 1.87 (H) 0.61 - 1.24 mg/dL Final  52/77/8242 35:36 AM 1.77 (H) 0.61 - 1.24 mg/dL Final  14/43/1540 08:67 AM 1.60 (H) 0.61 - 1.24 mg/dL Final  61/95/0932 67:12 AM 1.68 (H) 0.61 - 1.24 mg/dL Final  45/80/9983 38:25 AM 1.45 (H) 0.61 - 1.24 mg/dL Final  05/39/7673 41:93 AM 1.50 (H) 0.61 - 1.24 mg/dL Final  79/12/4095 35:32 AM 1.40 (H) 0.61 - 1.24  mg/dL Final  28/41/3244 01:02 AM 1.57 (H) 0.61 - 1.24 mg/dL Final  72/53/6644 03:47 PM 1.55 (H) 0.61 - 1.24 mg/dL Final  42/59/5638 75:64 PM 1.12 0.76 - 1.27 mg/dL Final  33/29/5188 41:66 AM 1.02 0.61 - 1.24 mg/dL Final  05/21/1600 09:32 AM 0.80 0.61 - 1.24 mg/dL Final  35/57/3220 25:42 AM 0.80 0.61 - 1.24 mg/dL Final  70/62/3762 83:15 AM 0.80 0.61 - 1.24 mg/dL Final  17/61/6073 71:06 AM 0.93 0.61 - 1.24 mg/dL Final  26/94/8546 27:03 AM 1.52 (H) 0.61 - 1.24 mg/dL Final   50/07/3817 29:93 AM 1.92 (H) 0.61 - 1.24 mg/dL Final  71/69/6789 38:10 AM 1.50 (H) 0.61 - 1.24 mg/dL Final  17/51/0258 52:77 PM 1.29 (H) 0.61 - 1.24 mg/dL Final  82/42/3536 14:43 AM 1.13 0.61 - 1.24 mg/dL Final  15/40/0867 61:95 AM 0.97 0.61 - 1.24 mg/dL Final  09/32/6712 45:80 AM 0.92 0.61 - 1.24 mg/dL Final  99/83/3825 05:39 AM 0.97 0.61 - 1.24 mg/dL Final  76/73/4193 79:02 AM 1.02 0.61 - 1.24 mg/dL Final  40/97/3532 99:24 PM 0.94 0.61 - 1.24 mg/dL Final  26/83/4196 22:29 AM 1.06 0.76 - 1.27 mg/dL Final  79/89/2119 41:74 AM 1.11 0.76 - 1.27 mg/dL Final  07/05/4817 56:31 PM 1.82 (H) 0.61 - 1.24 mg/dL Final  49/70/2637 85:88 PM 0.94 0.61 - 1.24 mg/dL Final  50/27/7412 87:86 AM 1.08 0.61 - 1.24 mg/dL Final  76/72/0947 09:62 AM 1.06 0.76 - 1.27 mg/dL Final  83/66/2947 65:46 PM 0.85 0.50 - 1.35 mg/dL Final     PMHx:   Past Medical History:  Diagnosis Date  . Anemia    low iron  . Arthritis    bilateral knees  . Atrial fibrillation, chronic   . Chronic diastolic CHF (congestive heart failure) (HCC) 06/15/2018  . History of subdural hematoma   . Hyperlipidemia   . Hypertension   . Morbid obesity (HCC)   . Normal coronary arteries    by cardiac catheterization performed 03/14/06  . Pre-diabetes    pt denies  . S/P TAVR (transcatheter aortic valve replacement)    a. 07/25/18: Edwards Sapien 3 THV (size 26 mm, model # 9600TFX, serial # [id number]) by Dr. Laneta Simmers and Dr. Clifton James  . Sleep apnea    on CPAP  . Venous insufficiency     Past Surgical History:  Procedure Laterality Date  . CRANIOTOMY Right 08/27/2016   Procedure: CRANIOTOMY HEMATOMA EVACUATION SUBDURAL;  Surgeon: Maeola Harman, MD;  Location: [facility] OR;  Service: Neurosurgery;  Laterality: Right;  . ESOPHAGOGASTRODUODENOSCOPY N/A 01/21/2019   Procedure: ESOPHAGOGASTRODUODENOSCOPY (EGD);  Surgeon: Vida Rigger, MD;  Location: [facility] ENDOSCOPY;  Service: Endoscopy;  Laterality: N/A;  . ESOPHAGOGASTRODUODENOSCOPY (EGD) WITH PROPOFOL  N/A 01/22/2019   Procedure: ESOPHAGOGASTRODUODENOSCOPY (EGD) WITH PROPOFOL;  Surgeon: Bernette Redbird, MD;  Location: [facility] ENDOSCOPY;  Service: Endoscopy;  Laterality: N/A;  . GASTRIC BYPASS  09/2008  . JOINT REPLACEMENT  08/31/2006   right hip  . MULTIPLE EXTRACTIONS WITH ALVEOLOPLASTY N/A 07/13/2018   Procedure: Extraction of tooth #'s 3,8,10, 23-26, 30and 32 with alveoloplasty and gross debridement of remaining teeth;  Surgeon: Charlynne Pander, DDS;  Location: [facility] OR;  Service: Oral Surgery;  Laterality: N/A;  . RIGHT HEART CATH N/A 07/06/2018   Procedure: RIGHT HEART CATH;  Surgeon: Kathleene Hazel, MD;  Location: [facility] INVASIVE CV LAB;  Service: Cardiovascular;  Laterality: N/A;  . RIGHT/LEFT HEART CATH AND CORONARY ANGIOGRAPHY N/A 07/10/2018   Procedure: RIGHT/LEFT HEART CATH AND CORONARY ANGIOGRAPHY;  Surgeon: Arvilla Meres  R, MD;  Location: [facility] INVASIVE CV LAB;  Service: Cardiovascular;  Laterality: N/A;  . TEE WITHOUT CARDIOVERSION N/A 07/25/2018   Procedure: TRANSESOPHAGEAL ECHOCARDIOGRAM (TEE);  Surgeon: Kathleene Hazel, MD;  Location: [facility] OR;  Service: Open Heart Surgery;  Laterality: N/A;  . TEE WITHOUT CARDIOVERSION N/A 01/18/2019   Procedure: TRANSESOPHAGEAL ECHOCARDIOGRAM (TEE) WITH PROPOFOL;  Surgeon: Jonelle Sidle, MD;  Location: [facility] ORS;  Service: Cardiovascular;  Laterality: N/A;  . TOTAL HIP ARTHROPLASTY     right hip  . TRANSCATHETER AORTIC VALVE REPLACEMENT, TRANSFEMORAL  07/25/2018  . TRANSCATHETER AORTIC VALVE REPLACEMENT, TRANSFEMORAL N/A 07/25/2018   Procedure: TRANSCATHETER AORTIC VALVE REPLACEMENT, TRANSFEMORAL;  Surgeon: Kathleene Hazel, MD;  Location: [facility] OR;  Service: Open Heart Surgery;  Laterality: N/A;    Family Hx:  Family History  Problem Relation Age of Onset  . Hypertension Father   . Cancer Father        prob prostate  . Alzheimer's disease Mother   . Alzheimer's disease Maternal Grandfather   . Breast cancer Sister   . Cancer  Brother        "brain tumor"    Social History:  reports that he has quit smoking. He has never used smokeless tobacco. He reports that he does not drink alcohol or use drugs.  Allergies: No Known Allergies  Medications: Prior to Admission medications   Medication Sig Start Date End Date Taking? Authorizing Provider  acetaminophen (TYLENOL) 650 MG CR tablet Take 650 mg by mouth 2 (two) times daily as needed for pain.   Yes [provider]  amoxicillin (AMOXIL) 500 MG capsule Take 2,000 mg (4 tablets) one hour prior to dental visits. 09/12/18  Yes Janetta Hora, PA-C  apixaban (ELIQUIS) 5 MG TABS tablet Take 1 tablet (5 mg total) by mouth 2 (two) times daily. 07/27/18  Yes Janetta Hora, PA-C  aspirin 81 MG chewable tablet Chew 1 tablet (81 mg total) by mouth daily. 07/27/18  Yes Janetta Hora, PA-C  atorvastatin (LIPITOR) 80 MG tablet TAKE 1 TABLET BY MOUTH ONCE DAILY WITH  BREAKFAST 09/13/18  Yes Runell Gess, MD  colchicine 0.6 MG tablet TAKE 1 TABLET BY MOUTH TWICE DAILY 09/19/18  Yes Donita Brooks, MD  diltiazem (CARDIZEM CD) 120 MG 24 hr capsule Take 1 capsule (120 mg total) by mouth daily. 12/08/18  Yes Farley, Velna Hatchet, MD  ergocalciferol (VITAMIN D2) 1.25 MG (50000 UT) capsule Take 50,000 Units by mouth once a week.   Yes [provider]  spironolactone (ALDACTONE) 25 MG tablet Take 0.5 tablets (12.5 mg total) by mouth daily. 07/16/18 07/16/19 Yes Barrett, Joline Salt, PA-C  torsemide (DEMADEX) 20 MG tablet Take 40 mg by mouth as needed. Use as needed if weight is over 245 pounds.   Yes [provider]    I have reviewed the patient's current medications.  Labs:  Results for orders placed or performed during the hospital encounter of 01/13/19 (from the past 48 hour(s))  Glucose, capillary     Status: Abnormal   Collection Time: 01/21/19  8:47 PM  Result Value Ref Range   Glucose-Capillary 130 (H) 70 - 99 mg/dL   Comment 1 Notify RN     Comment 2 Document in Chart   CBC     Status: Abnormal   Collection Time: 01/21/19 10:11 PM  Result Value Ref Range   WBC 25.1 (H) 4.0 - 10.5 K/uL   RBC 2.95 (L) 4.22 - 5.81 MIL/uL  Hemoglobin 7.5 (L) 13.0 - 17.0 g/dL    Comment: Reticulocyte Hemoglobin testing may be clinically indicated, consider ordering this additional test [id number]    HCT 20.9 (L) 39.0 - 52.0 %   MCV 70.8 (L) 80.0 - 100.0 fL   MCH 25.4 (L) 26.0 - 34.0 pg   MCHC 35.9 30.0 - 36.0 g/dL   RDW 81.1 (H) 91.4 - 78.2 %   Platelets 234 150 - 400 K/uL   nRBC 0.1 0.0 - 0.2 %    Comment: Performed at [facility], [address], [place], [name] [address]  Glucose, capillary     Status: Abnormal   Collection Time: 01/22/19 12:38 AM  Result Value Ref Range   Glucose-Capillary 114 (H) 70 - 99 mg/dL   Comment 1 Notify RN    Comment 2 Document in Chart   CBC     Status: Abnormal   Collection Time: 01/22/19  2:17 AM  Result Value Ref Range   WBC 25.3 (H) 4.0 - 10.5 K/uL   RBC 2.92 (L) 4.22 - 5.81 MIL/uL   Hemoglobin 7.2 (L) 13.0 - 17.0 g/dL    Comment: Reticulocyte Hemoglobin testing may be clinically indicated, consider ordering this additional test [id number]    HCT 20.8 (L) 39.0 - 52.0 %   MCV 71.2 (L) 80.0 - 100.0 fL   MCH 24.7 (L) 26.0 - 34.0 pg   MCHC 34.6 30.0 - 36.0 g/dL   RDW 69.6 (H) 29.5 - 28.4 %   Platelets 216 150 - 400 K/uL   nRBC 0.2 0.0 - 0.2 %    Comment: Performed at [facility], [address], [place], [name] [address]  Basic metabolic panel     Status: Abnormal   Collection Time: 01/22/19  2:17 AM  Result Value Ref Range   Sodium 139 135 - 145 mmol/L   Potassium 5.5 (H) 3.5 - 5.1 mmol/L   Chloride 115 (H) 98 - 111 mmol/L   CO2 15 (L) 22 - 32 mmol/L   Glucose, Bld 126 (H) 70 - 99 mg/dL   BUN 71 (H) 8 - 23 mg/dL   Creatinine, Ser 0.10 (H) 0.61 - 1.24 mg/dL   Calcium 7.5 (L) 8.9 - 10.3 mg/dL   GFR calc non Af Amer 36 (L) >60 mL/min   GFR calc Af Amer 42 (L) >60 mL/min    Anion gap 9 5 - 15    Comment: Performed at [facility], [address], [place], [name] [address]  Glucose, capillary     Status: Abnormal   Collection Time: 01/22/19  4:04 AM  Result Value Ref Range   Glucose-Capillary 112 (H) 70 - 99 mg/dL  Prepare RBC     Status: None   Collection Time: 01/22/19  7:12 AM  Result Value Ref Range   Order Confirmation      ORDER PROCESSED BY BLOOD BANK Performed at [facility], [address] [place], [name] [address]   Glucose, capillary     Status: Abnormal   Collection Time: 01/22/19  8:32 AM  Result Value Ref Range   Glucose-Capillary 107 (H) 70 - 99 mg/dL  Prepare RBC     Status: None   Collection Time: 01/22/19 11:06 AM  Result Value Ref Range   Order Confirmation      ORDER PROCESSED BY BLOOD BANK Performed at [facility], [address] [place], [name] [address]   Glucose, capillary     Status:  None   Collection Time: 01/22/19 12:47 PM  Result Value Ref Range   Glucose-Capillary 99 70 - 99 mg/dL   Comment 1 Notify RN    Comment 2 Document in Chart   Glucose, capillary     Status: Abnormal   Collection Time: 01/22/19  3:20 PM  Result Value Ref Range   Glucose-Capillary 100 (H) 70 - 99 mg/dL  MRSA PCR Screening     Status: None   Collection Time: 01/22/19  3:22 PM  Result Value Ref Range   MRSA by PCR NEGATIVE NEGATIVE    Comment:        The GeneXpert MRSA Assay (FDA approved for NASAL specimens only), is one component of a comprehensive MRSA colonization surveillance program. It is not intended to diagnose MRSA infection nor to guide or monitor treatment for MRSA infections. Performed at [facility], [address], [place], [name] [address]   Hemoglobin and hematocrit, blood     Status: Abnormal   Collection Time: 01/22/19  3:30 PM  Result Value Ref Range   Hemoglobin 7.9 (L) 13.0 - 17.0 g/dL   HCT 60.4 (L) 54.0 - 98.1 %    Comment: Performed at [facility], [address]  [place], [name] [address]  CBC     Status: Abnormal   Collection Time: 01/22/19 11:05 PM  Result Value Ref Range   WBC 21.3 (H) 4.0 - 10.5 K/uL   RBC 3.19 (L) 4.22 - 5.81 MIL/uL   Hemoglobin 8.6 (L) 13.0 - 17.0 g/dL    Comment: Reticulocyte Hemoglobin testing may be clinically indicated, consider ordering this additional test [id number]    HCT 24.8 (L) 39.0 - 52.0 %   MCV 77.7 (L) 80.0 - 100.0 fL    Comment: POST TRANSFUSION SPECIMEN   MCH 27.0 26.0 - 34.0 pg   MCHC 34.7 30.0 - 36.0 g/dL   RDW 08.6 (H) 57.8 - 46.9 %   Platelets 187 150 - 400 K/uL   nRBC 0.7 (H) 0.0 - 0.2 %    Comment: Performed at [facility], [address] [place], [name] [address]  CBC     Status: Abnormal   Collection Time: 01/23/19  3:54 AM  Result Value Ref Range   WBC 19.9 (H) 4.0 - 10.5 K/uL   RBC 3.26 (L) 4.22 - 5.81 MIL/uL   Hemoglobin 8.6 (L) 13.0 - 17.0 g/dL    Comment: Reticulocyte Hemoglobin testing may be clinically indicated, consider ordering this additional test [id number]    HCT 25.4 (L) 39.0 - 52.0 %   MCV 77.9 (L) 80.0 - 100.0 fL   MCH 26.4 26.0 - 34.0 pg   MCHC 33.9 30.0 - 36.0 g/dL   RDW 10.2 (H) 72.5 - 36.6 %   Platelets 172 150 - 400 K/uL   nRBC 1.2 (H) 0.0 - 0.2 %    Comment: Performed at [facility], [address] [place], [name] [address]  Comprehensive metabolic panel     Status: Abnormal   Collection Time: 01/23/19  3:54 AM  Result Value Ref Range   Sodium 142 135 - 145 mmol/L   Potassium 4.7 3.5 - 5.1 mmol/L   Chloride 118 (H) 98 - 111 mmol/L   CO2 17 (L) 22 - 32 mmol/L   Glucose, Bld 119 (H) 70 - 99 mg/dL   BUN 72 (H) 8 - 23 mg/dL   Creatinine, Ser 7.42 (H) 0.61 - 1.24 mg/dL   Calcium  8.0 (L) 8.9 - 10.3 mg/dL   Total Protein 6.2 (L) 6.5 - 8.1 g/dL   Albumin 2.1 (L) 3.5 - 5.0 g/dL   AST 71 (H) 15 - 41 U/L   ALT 6 0 - 44 U/L   Alkaline Phosphatase 68 38 - 126 U/L   Total Bilirubin 0.9 0.3 - 1.2 mg/dL   GFR calc non Af Amer 34 (L) >60 mL/min   GFR calc  Af Amer 40 (L) >60 mL/min   Anion gap 7 5 - 15    Comment: Performed at [facility], [address] [place], [place] [address]  CBC     Status: Abnormal   Collection Time: 01/23/19 12:06 PM  Result Value Ref Range   WBC 21.1 (H) 4.0 - 10.5 K/uL   RBC 3.35 (L) 4.22 - 5.81 MIL/uL   Hemoglobin 8.6 (L) 13.0 - 17.0 g/dL   HCT 47.826.7 (L) 29.539.0 - 62.152.0 %   MCV 79.7 (L) 80.0 - 100.0 fL   MCH 25.7 (L) 26.0 - 34.0 pg   MCHC 32.2 30.0 - 36.0 g/dL   RDW 30.819.5 (H) 65.711.5 - 84.615.5 %   Platelets 181 150 - 400 K/uL   nRBC 1.4 (H) 0.0 - 0.2 %    Comment: Performed at [facility], [address] [place], [place] [address]     ROS:  Pertinent items noted in HPI and remainder of comprehensive ROS otherwise negative.  Physical Exam: Vitals:   01/23/19 1200 01/23/19 1300  BP: 138/79 (!) 142/77  Pulse: 71 65  Resp:  13  Temp:    SpO2: 100% 100%     General exam: Appears calm and comfortable  Respiratory system: Clear to auscultation. Respiratory effort normal. No wheezing or crackle Cardiovascular system: S1 & S2 heard, RRR.  No pedal edema.  No rubs Gastrointestinal system: Abdomen is nondistended, soft. Normal bowel sounds heard. Central nervous system: Alert awake and following commands. Extremities: No edema, no cyanosis. Skin: No rashes, lesions or ulcers Psychiatry: Judgement and insight appear normal. Mood & affect appropriate.   Assessment/Plan:  #Acute kidney injury, non-oliguric: likely ischemic ATN in the setting of sepsis, hemodynamic compromise due to severe anemia and GIB.  -Check urinalysis, urine electrolytes, urine PCR -Bladder scan and check kidney ultrasound to evaluate echogenicity and obstruction. -Change IV fluid to sodium bicarbonate with D5.  -Monitor BMP, urine output -If patient needs IV contrast then I would recommend to minimize contrast dose during angiogram. -No need for dialysis.  #Metabolic acidosis: Restarting sodium bicarbonate.  Monitor  labs.  #Hypertension: Blood pressure acceptable.  Continue to monitor.  #Acute blood loss anemia due to GI bleeding: Status post multiple blood cell transfusion.  Currently on octreotide and PPI per GI.  Endoscopy unable to locate site of bleeding.  #Severe sepsis due to a streptococcal bacteremia: TEE with no vegetation.  On IV Zosyn.  #Acute multifocal embolic stroke: Seen by neurology.  Eliquis on hold because of GI bleed.  Thank you for the consult, we will continue to follow with you.  Kyeshia Zinn Jaynie CollinsPrasad Rachelanne Whidby 01/23/2019, 1:46 PM  [facility].

## 2019-01-23 NOTE — Progress Notes (Signed)
Problem:  UGIB of indeterminate origin while on ASA/Eliquis.  (See yesterday's egd rept).  Pt has been moved to ICU.  Quiet night.  No evident bleeding.  On octreotide and pantoprazole infusions.    Note radiographic evid of "cirrhosis" on prior CT, but w/ nl plt count, and no radiographic findings of portal HTN, this is of uncertain significance.  Pt states he feels better--some dyspeptic sx, but better than before.  No CP or SOB.  Has received 6 u prc's so far this admission (2 yesterday), with (finally) stable hgb over past 12 hrs w/out intervening transfusion.  Pt's creatinine continues to creep upward--currently 1.95.  IR and Gen'l Surg input much appreciated.   Exam:  Lying in bed, alert, jovial, NAD, chest clr, heart w/ irreg rhythm and 3/6 systolic murmur.  Abd obese but soft, NT.  No evident focal neuro deficits.  IMPR:  1. UGIB s/p remote gastric bypass.  Source unclear 2. Severe post-hemorrhagic anemia 3. Acute on chronic renal insuffic   PLAN:  1. Continue observation on current infusions--so far, so good.  The risk of re-bleeding will decrease progressively as time goes on, especially as ASA effect diminishes (last dose 2 days ago).  2. Have consulted Nephrology to guide [place] in view of progressive azotemia and with potential need for IV contrast if IR treatment becomes necessary (which hopefully won't be the case, now that pt has been stable over 12 hrs).  3. Will allow sips and chips.  4. If pt remains w/out bleeding, consider elective repeat egd in about 2 days to try to ascertain source of recent severe bleeding and to guide future mgt, including ASA and Eliquis resumption.  5. If pt does re-bleed, consider IR vs emerg surg w/ immediate pre-op egd in OR.  Will continue to follow.   Time 35'.  Florencia Reasons, M.D. Pager [phone] If no answer or after 5 PM call [phone]

## 2019-01-23 NOTE — Progress Notes (Signed)
[place] TEAM 1 - Stepdown/ICU TEAM  Joel Rogers  WGN:[id number] DOB: 1951-04-13 DOA: 01/13/2019 PCP: [name], MD    Brief Narrative:  68 y/o admitted to [facility] w/ sepsis and Streptococcal bacteremia due to R LE cellulitis. He was treated with IV antibiotics and improved. Due to his hx of bioprosthetic aortic valve, TEE was performed, and did not note vegetations. Since admission he was noted to have persistent right-sided weakness. An MRI brain that showed bilateral CVAs.  He was transferred to [facility] 2/29 for a Neurology evaluation. On 3/1 he developed coffee-ground emesis w/ acute blood loss anemia. At Endoscopy 01/21/2019, and again on 3/2 he was found to have a significant amount of blood/blood clot in the stomach and distal esophagus, such that an intervention could not be performed.  Significant Events: 2/22 admit to [facility] w/ sepsis / bacteremia  2/27 TEE > no vegetations  2/29 transfer to [facility]  3/1 EGD 3/2 EGD  Subjective: Appears bleeding has ceased for now.  The patient is alert and oriented though mildly confused at times.  He denies chest pain shortness of breath nausea or vomiting.  There has been no evidence of recurrent bleeding overnight.  Assessment & Plan:  Upper GI bleed of indeterminate origin  coffee-ground emesis - EGD 3/1 and 3/2 both significant for large amount of blood/blood clot in the stomach with poor visualization - empiric protonix and octreotide drips, though no known hx of esophageal varices, alcohol abuse - evidence of liver "cirrhosis" noted on CT in 2019, but reportedly no clinical features previously to suggest this - for the moment the bleeding appears to have ceased   Acute blood loss anemia transfused 6 units PRBC thus far - Hgb stable thus far today - follow in serial fashion - keep Hgb 8.0 or > in this clinical situation   Severe sepsis - Streptococcus bacteremia Due to right lower extremity cellulitis - 1 of 2 positive blood cultures for  group C Streptococcus - TTE w/o evidence of vegetations - has bioprosthetic aortic valve therefore TEE on 2/27 did not show vegetations - PICC line placed - complete a 2 week course of antibiotics - cellulitis appears resolved on exam   Aspiration pneumonia CXR noted left lung base opacity, likely aspiration - abx changed to Zosyn to complete 5 days   Hyperkalemia Corrected   AKI on CKD stage III baseline creatinine 1.2 - crt still slowly climbing, likely due to hemorrhage - maximize BP - avoid nephrotoxins - monitor w/ time   Multifocal presumed embolic Acute CVAs right-sided weakness noted > MRI noted bilateral infarcts - felt to be secondary to embolic stroke related to holding Eliquis due to thrombocytopenia - MRA head was unremarkable - TEE with no embolic source identified   Chronic atrial fibrillation chronically on Eliquis - held initially due to severe thrombocytopenia, and now due to active GI bleed - rate controlled  Small SAH  Noted on CT head 2/29 - will need repeat CT head before consideration of resuming anticoag - if mental status changes abruptly will need STAT f/u CT   Thrombocytopenia Initial severe thrombocytopenia felt secondary to sepsis - was trending upward, but now trending back down, likely due to consumption in setting of GIB   Status post TAVR Sept 2019  followed by Dr. Allyson Sabal   Acute on chronic right shoulder pain plain film XR notes arthritic change in the shoulder - no exam findings to suggest septic arthritis   Hx Rouen-Y Gastric Bypass  HTN blood pressure stable   Right arm swelling.  Venous doppler negative for DVT  DVT prophylaxis: SCDs Code Status: FULL CODE Family Communication: no family present at time of exam  Disposition Plan: ICU until bleeding stable   Consultants:  Cardiology for TEE at [facility] Gastroenterology General Surgery Interventional radiology Nephrology  Antimicrobials:  Cefepime 2/22 > 2/24 Vancomycin  2/22 > 2/25 Metronidazole 2/22 > 2/13 Ancef 2/25 > 3/1 Zosyn 3/1 >   Objective: Blood pressure (!) 111/95, pulse 70, temperature 98.2 F (36.8 C), temperature source Oral, resp. rate 14, height 6\' 1"  (1.854 m), weight 128.1 kg, SpO2 100 %.  Intake/Output Summary (Last 24 hours) at 01/23/2019 0841 Last data filed at 01/23/2019 0600 Gross per 24 hour  Intake 3374.7 ml  Output 1425 ml  Net 1949.7 ml   Filed Weights   01/19/19 2106 01/20/19 0423 01/22/19 0802  Weight: 123.3 kg 128.1 kg 128.1 kg    Examination: General: No acute respiratory distress Lungs: Clear to auscultation bilaterally without wheezes or crackles Cardiovascular: irreg irreg - rate controlled - no rub  Abdomen: Nontender, nondistended, soft, bowel sounds positive, no rebound, no ascites, no appreciable mass Extremities: 1+ B UE edema - trace B LE edema   CBC: Recent Labs  Lab 01/17/19 0418 01/18/19 0424 01/19/19 0526  01/22/19 0217 01/22/19 1530 01/22/19 2305 01/23/19 0354  WBC 24.2* 20.9* 17.3*   < > 25.3*  --  21.3* 19.9*  NEUTROABS 19.4* 15.4* 12.5*  --   --   --   --   --   HGB 11.0* 10.4* 10.0*   < > 7.2* 7.9* 8.6* 8.6*  HCT 31.7* 30.4* 29.8*   < > 20.8* 22.7* 24.8* 25.4*  MCV 69.7* 68.0* 70.3*   < > 71.2*  --  77.7* 77.9*  PLT 105* 153 224   < > 216  --  187 172   < > = values in this interval not displayed.   Basic Metabolic Panel: Recent Labs  Lab 01/21/19 0538 01/22/19 0217 01/23/19 0354  NA 136 139 142  K 5.4* 5.5* 4.7  CL 113* 115* 118*  CO2 18* 15* 17*  GLUCOSE 154* 126* 119*  BUN 57* 71* 72*  CREATININE 1.77* 1.87* 1.95*  CALCIUM 7.8* 7.5* 8.0*   GFR: Estimated Creatinine Clearance: 50.9 mL/min (A) (by C-G formula based on SCr of 1.95 mg/dL (H)).  Liver Function Tests: Recent Labs  Lab 01/17/19 0418 01/18/19 0424 01/19/19 0526 01/23/19 0354  AST 30 26 26  71*  ALT 24 15 7 6   ALKPHOS 105 105 102 68  BILITOT 1.6* 1.4* 1.3* 0.9  PROT 6.0* 6.1* 6.3* 6.2*  ALBUMIN 2.3* 2.1*  2.0* 2.1*    HbA1C: Hemoglobin A1C  Date/Time Value Ref Range Status  01/22/2016 6.2  Final   Hgb A1c MFr Bld  Date/Time Value Ref Range Status  07/21/2018 10:20 AM 5.8 (H) 4.8 - 5.6 % Final    Comment:    (NOTE) Pre diabetes:          5.7%-6.4% Diabetes:              >6.4% Glycemic control for   <7.0% adults with diabetes   11/26/2016 02:06 PM 5.6 <5.7 % Final    Comment:      For the purpose of screening for the presence of diabetes:   <5.7%       Consistent with the absence of diabetes 5.7-6.4 %   Consistent with increased risk for diabetes (  prediabetes) >=6.5 %     Consistent with diabetes   This assay result is consistent with a decreased risk of diabetes.   Currently, no consensus exists regarding use of hemoglobin A1c for diagnosis of diabetes in children.   According to American Diabetes Association (ADA) guidelines, hemoglobin A1c <7.0% represents optimal control in non-pregnant diabetic patients. Different metrics may apply to specific patient populations. Standards of Medical Care in Diabetes (ADA).       CBG: Recent Labs  Lab 01/22/19 0038 01/22/19 0404 01/22/19 0832 01/22/19 1247 01/22/19 1520  GLUCAP 114* 112* 107* 99 100*    Recent Results (from the past 240 hour(s))  Culture, blood (routine x 2)     Status: None   Collection Time: 01/13/19  3:51 PM  Result Value Ref Range Status   Specimen Description BLOOD LEFT ARM  Final   Special Requests   Final    BOTTLES DRAWN AEROBIC ONLY Blood Culture adequate volume   Culture   Final    NO GROWTH 5 DAYS Performed at [facility], [address] [place], [name] [address]    Report Status 01/18/2019 FINAL  Final  Urine culture     Status: Abnormal   Collection Time: 01/13/19  5:40 PM  Result Value Ref Range Status   Specimen Description   Final    URINE, CATHETERIZED Performed at [facility], [address], [place], [name] [address]    Special Requests   Final    NONE Performed at [facility], [address] [place], [name] [address]    Culture (A)  Final    <10,000 COLONIES/mL INSIGNIFICANT GROWTH Performed at [facility], [address] [place], [name] [address]    Report Status 01/15/2019 FINAL  Final  Culture, blood (routine x 2)     Status: Abnormal   Collection Time: 01/13/19  7:47 PM  Result Value Ref Range Status   Specimen Description   Final    BLOOD LEFT ARM Performed at [facility], [address] [place], [name] [address]    Special Requests   Final    BOTTLES DRAWN AEROBIC AND ANAEROBIC Blood Culture adequate volume Performed at [facility], [address], [place], [name] [address]    Culture  Setup Time   Final    GRAM POSITIVE COCCI IN CHAINS Gram Stain Report Called to,Read Back By and Verified With: BOUDERANT,R @ 0516 ON 01/14/19 BY JUW GS DONE @ [facility] CRITICAL RESULT CALLED TO, READ BACK BY AND VERIFIED WITH: S. HALL, PHARMD ([facility]) AT 1010 ON 01/14/19 BY C. JESSUP, MLT. Performed at [facility], [address], [place], [name] [address]    Culture STREPTOCOCCUS GROUP C (A)  Final   Report Status 01/16/2019 FINAL  Final   Organism ID, Bacteria STREPTOCOCCUS GROUP C  Final      Susceptibility   Streptococcus group c - MIC*    CLINDAMYCIN <=0.25 SENSITIVE Sensitive     AMPICILLIN <=0.25 SENSITIVE Sensitive     ERYTHROMYCIN <=0.12 SENSITIVE Sensitive     VANCOMYCIN <=0.12 SENSITIVE Sensitive     CEFTRIAXONE <=0.12 SENSITIVE Sensitive     LEVOFLOXACIN 0.5 SENSITIVE Sensitive     PENICILLIN Value in next row Sensitive      SENSITIVE<=0.06    * STREPTOCOCCUS GROUP C  Blood Culture ID Panel (Reflexed)     Status: Abnormal   Collection Time: 01/13/19  7:47 PM  Result Value Ref Range Status   Enterococcus species  NOT DETECTED NOT DETECTED Final   Listeria monocytogenes NOT DETECTED NOT DETECTED Final   Staphylococcus species NOT DETECTED NOT DETECTED Final   Staphylococcus aureus (BCID) NOT DETECTED NOT DETECTED Final    Streptococcus species DETECTED (A) NOT DETECTED Final    Comment: Not Enterococcus species, Streptococcus agalactiae, Streptococcus pyogenes, or Streptococcus pneumoniae. CRITICAL RESULT CALLED TO, READ BACK BY AND VERIFIED WITH: S. HALL, PHARMD ([facility]) AT 1010 ON 01/14/19 BY C. JESSUP, MLT.    Streptococcus agalactiae NOT DETECTED NOT DETECTED Final   Streptococcus pneumoniae NOT DETECTED NOT DETECTED Final   Streptococcus pyogenes NOT DETECTED NOT DETECTED Final   Acinetobacter baumannii NOT DETECTED NOT DETECTED Final   Enterobacteriaceae species NOT DETECTED NOT DETECTED Final   Enterobacter cloacae complex NOT DETECTED NOT DETECTED Final   Escherichia coli NOT DETECTED NOT DETECTED Final   Klebsiella oxytoca NOT DETECTED NOT DETECTED Final   Klebsiella pneumoniae NOT DETECTED NOT DETECTED Final   Proteus species NOT DETECTED NOT DETECTED Final   Serratia marcescens NOT DETECTED NOT DETECTED Final   Haemophilus influenzae NOT DETECTED NOT DETECTED Final   Neisseria meningitidis NOT DETECTED NOT DETECTED Final   Pseudomonas aeruginosa NOT DETECTED NOT DETECTED Final   Candida albicans NOT DETECTED NOT DETECTED Final   Candida glabrata NOT DETECTED NOT DETECTED Final   Candida krusei NOT DETECTED NOT DETECTED Final   Candida parapsilosis NOT DETECTED NOT DETECTED Final   Candida tropicalis NOT DETECTED NOT DETECTED Final    Comment: Performed at [facility], [address] [place], [name] [address]  Culture, blood (Routine X 2) w Reflex to ID Panel     Status: None   Collection Time: 01/15/19  8:52 AM  Result Value Ref Range Status   Specimen Description BLOOD LEFT FOREARM  Final   Special Requests   Final    BOTTLES DRAWN AEROBIC AND ANAEROBIC Blood Culture adequate volume   Culture   Final    NO GROWTH 5 DAYS Performed at [facility], [address], [place], [name] [address]    Report Status 01/20/2019 FINAL  Final  Culture, blood (Routine X 2) w Reflex to ID Panel      Status: None   Collection Time: 01/15/19  9:05 AM  Result Value Ref Range Status   Specimen Description BLOOD LEFT HAND  Final   Special Requests   Final    BOTTLES DRAWN AEROBIC AND ANAEROBIC Blood Culture results may not be optimal due to an excessive volume of blood received in culture bottles   Culture   Final    NO GROWTH 5 DAYS Performed at [facility], [address], [place], [name] [address]    Report Status 01/20/2019 FINAL  Final  MRSA PCR Screening     Status: None   Collection Time: 01/22/19  3:22 PM  Result Value Ref Range Status   MRSA by PCR NEGATIVE NEGATIVE Final    Comment:        The GeneXpert MRSA Assay (FDA approved for NASAL specimens only), is one component of a comprehensive MRSA colonization surveillance program. It is not intended to diagnose MRSA infection nor to guide or monitor treatment for MRSA infections. Performed at [facility], [address] [place], [name] [address]      Scheduled Meds: .  stroke: mapping our early stages of recovery book   Does not apply Once  . atorvastatin  80 mg Oral q1800  . [START ON 01/24/2019] pantoprazole  40  mg Intravenous Q12H  . sodium chloride flush  10-40 mL Intracatheter Q12H  . sodium polystyrene  45 g Rectal Once  . sodium zirconium cyclosilicate  10 g Oral TID   Continuous Infusions: . sodium chloride 75 mL/hr at 01/23/19 0600  . sodium chloride Stopped (01/23/19 0530)  . octreotide  (SANDOSTATIN)    IV infusion 50 mcg/hr (01/23/19 0600)  . pantoprozole (PROTONIX) infusion 8 mg/hr (01/23/19 0600)  . piperacillin-tazobactam (ZOSYN)  IV 12.5 mL/hr at 01/23/19 0600     LOS: 10 days   Lonia Blood, MD [facility] Office  [phone] Pager - Text Page per Amion  If 7PM-7AM, please contact night-coverage per Amion 01/23/2019, 8:41 AM

## 2019-01-23 NOTE — Progress Notes (Signed)
[facility]/Trauma Progress Note  1 Day Post-Op   Assessment/Plan Hx gastric bypass with GI bleed Sepsis with streptococcal bacteremia New bilateral CVA - embolic/ subarachnoid bleed S/p TAVR 07/2018 AF - on Eliquis/Aspirin - stopped 01/20/19 Hypertension Hx OSA AKI  Upper GI bleed - no source identified - hx Rouen-Y Gastric Bypass  - pt underwent EGD 03/02 which showed clotted blood in stomach but no source seen - IR is following and recommending a CTA of abd/pel w/ BRTO when creatinine improves - no surgical indications at this time - we will follow  ABLA - transfuse per CCM, Hg today 8.6  FEN: NPO, PPI IV VTE: SCD's, holding Eliquis ID: Zosyn 03/01>> Follow up: TBD    LOS: 10 days    Subjective: CC: upper GI bleed  No abdominal pain. No vomiting after EGD yesterday. No complaints. He is thirsty  Objective: Vital signs in last 24 hours: Temp:  [97.7 F (36.5 C)-98.7 F (37.1 C)] 98.2 F (36.8 C) (03/03 0733) Pulse Rate:  [42-95] 71 (03/03 0600) Resp:  [12-24] 15 (03/03 0600) BP: (73-140)/(29-84) 115/84 (03/03 0600) SpO2:  [99 %-100 %] 100 % (03/03 0600) Weight:  [128.1 kg] 128.1 kg (03/02 0802) Last BM Date: 01/15/19  Intake/Output from previous day: 03/02 0701 - 03/03 0700 In: 3374.7 [I.V.:2356.6; Blood:945; IV Piggyback:73.1] Out: 1425 [Urine:1425] Intake/Output this shift: No intake/output data recorded.  PE: Gen:  Alert, NAD, pleasant, cooperative Pulm:  Rate and effort normal Abd: Soft, NT/ND, +BS Skin: warm and dry   Anti-infectives: Anti-infectives (From admission, onward)   Start     Dose/Rate Route Frequency Ordered Stop   01/21/19 1400  piperacillin-tazobactam (ZOSYN) IVPB 3.375 g     3.375 g 12.5 mL/hr over 240 Minutes Intravenous Every 8 hours 01/21/19 1224     01/16/19 2200  ceFAZolin (ANCEF) IVPB 2g/100 mL premix  Status:  Discontinued     2 g 200 mL/hr over 30 Minutes Intravenous Every 8 hours 01/16/19 1700 01/21/19  1205   01/16/19 1800  cefTRIAXone (ROCEPHIN) 2 g in sodium chloride 0.9 % 100 mL IVPB  Status:  Discontinued     2 g 200 mL/hr over 30 Minutes Intravenous Every 24 hours 01/16/19 1459 01/16/19 1650   01/14/19 1200  vancomycin (VANCOCIN) 1,500 mg in sodium chloride 0.9 % 500 mL IVPB  Status:  Discontinued     1,500 mg 250 mL/hr over 120 Minutes Intravenous Every 24 hours 01/14/19 0847 01/16/19 1459   01/14/19 1000  ceFEPIme (MAXIPIME) 1 g in sodium chloride 0.9 % 100 mL IVPB  Status:  Discontinued     1 g 200 mL/hr over 30 Minutes Intravenous Every 12 hours 01/14/19 0845 01/15/19 1040   01/13/19 2115  vancomycin (VANCOCIN) IVPB 1000 mg/200 mL premix     1,000 mg 200 mL/hr over 60 Minutes Intravenous  Once 01/13/19 2000 01/14/19 0701   01/13/19 2015  ceFEPIme (MAXIPIME) 2 g in sodium chloride 0.9 % 100 mL IVPB     2 g 200 mL/hr over 30 Minutes Intravenous  Once 01/13/19 2000 01/13/19 2205   01/13/19 2000  vancomycin (VANCOCIN) IVPB 1000 mg/200 mL premix     1,000 mg 200 mL/hr over 60 Minutes Intravenous  Once 01/13/19 2000 01/14/19 0701   01/13/19 1945  metroNIDAZOLE (FLAGYL) IVPB 500 mg     500 mg 100 mL/hr over 60 Minutes Intravenous Every 8 hours 01/13/19 1934 01/14/19 1338      Lab Results:  Recent Labs  01/22/19 2305 01/23/19 0354  WBC 21.3* 19.9*  HGB 8.6* 8.6*  HCT 24.8* 25.4*  PLT 187 172   BMET Recent Labs    01/22/19 0217 01/23/19 0354  NA 139 142  K 5.5* 4.7  CL 115* 118*  CO2 15* 17*  GLUCOSE 126* 119*  BUN 71* 72*  CREATININE 1.87* 1.95*  CALCIUM 7.5* 8.0*   PT/INR No results for input(s): LABPROT, INR in the last 72 hours. CMP     Component Value Date/Time   NA 142 01/23/2019 0354   NA 141 08/03/2018 1421   K 4.7 01/23/2019 0354   CL 118 (H) 01/23/2019 0354   CO2 17 (L) 01/23/2019 0354   GLUCOSE 119 (H) 01/23/2019 0354   BUN 72 (H) 01/23/2019 0354   BUN 16 08/03/2018 1421   CREATININE 1.95 (H) 01/23/2019 0354   CREATININE 1.23 11/01/2018  1206   CALCIUM 8.0 (L) 01/23/2019 0354   PROT 6.2 (L) 01/23/2019 0354   ALBUMIN 2.1 (L) 01/23/2019 0354   AST 71 (H) 01/23/2019 0354   ALT 6 01/23/2019 0354   ALKPHOS 68 01/23/2019 0354   BILITOT 0.9 01/23/2019 0354   GFRNONAA 34 (L) 01/23/2019 0354   GFRNONAA 74 05/01/2018 0913   GFRAA 40 (L) 01/23/2019 0354   GFRAA 86 05/01/2018 0913   Lipase  No results found for: LIPASE  Studies/Results: Dg Chest Port 1 View  Result Date: 01/21/2019 CLINICAL DATA:  Cough and leukocytosis. EXAM: PORTABLE CHEST 1 VIEW COMPARISON:  01/15/2019 FINDINGS: Marked cardiomegaly and aortic valve replacement again noted. Pulmonary vascular congestion identified. LEFT basilar opacity noted. No pleural effusion or pneumothorax. A LEFT PICC line is again identified with tip overlying the LOWER SVC. IMPRESSION: New LEFT basilar opacity, question atelectasis versus airspace disease/pneumonia. Marked cardiomegaly with pulmonary vascular congestion. Electronically Signed   By: Harmon Pier M.D.   On: 01/21/2019 11:37      Jerre Simon , [facility] 01/23/2019, 7:51 AM  Pager: [phone] Mon-Wed, Friday 7:00am-4:30pm Thurs 7am-11:30am  Consults: [phone]

## 2019-01-23 NOTE — Progress Notes (Signed)
STROKE TEAM PROGRESS NOTE   SUBJECTIVE (INTERVAL HISTORY) RN is at the bedside.  Patient is now in icu. Has received 2 units PRcs. Stable from hemodynamic and hematocrit standpoint. Ct head not done due to concerns about safety for transport. No neuro changes   OBJECTIVE Vitals:   01/23/19 1200 01/23/19 1300 01/23/19 1400 01/23/19 1510  BP: 138/79 (!) 142/77 (!) 143/86   Pulse: 71 65 63   Resp:  13 19   Temp:    98.2 F (36.8 C)  TempSrc:    Oral  SpO2: 100% 100% 100%   Weight:      Height:        CBC:  Recent Labs  Lab 01/18/19 0424 01/19/19 0526  01/23/19 0354 01/23/19 1206  WBC 20.9* 17.3*   < > 19.9* 21.1*  NEUTROABS 15.4* 12.5*  --   --   --   HGB 10.4* 10.0*   < > 8.6* 8.6*  HCT 30.4* 29.8*   < > 25.4* 26.7*  MCV 68.0* 70.3*   < > 77.9* 79.7*  PLT 153 224   < > 172 181   < > = values in this interval not displayed.    Basic Metabolic Panel:  Recent Labs  Lab 01/22/19 0217 01/23/19 0354  NA 139 142  K 5.5* 4.7  CL 115* 118*  CO2 15* 17*  GLUCOSE 126* 119*  BUN 71* 72*  CREATININE 1.87* 1.95*  CALCIUM 7.5* 8.0*    Lipid Panel:     Component Value Date/Time   CHOL 94 01/21/2019 0538   TRIG 67 01/21/2019 0538   HDL 11 (L) 01/21/2019 0538   CHOLHDL 8.5 01/21/2019 0538   VLDL 13 01/21/2019 0538   LDLCALC 70 01/21/2019 0538   LDLCALC 70 05/01/2018 0913   HgbA1c:  Lab Results  Component Value Date   HGBA1C 5.8 (H) 07/21/2018    IMAGING  Mr Maxine Glenn Head Wo Contrast 01/19/2019 1. Numerous scattered foci of acute ischemia throughout the cerebrum and cerebellum. Many of these lesions are most suggestive of embolic infarcts, given the widespread distribution over multiple vascular territories. Others are in a deep watershed distribution, as is seen in the setting of hypoperfusion events.  2. Areas of magnetic susceptibility effect superimposed on diffusion abnormality along the left postcentral gyrus, right frontal convexity and lateral ventricular atria  are suggestive subarachnoid blood. Noncontrast head CT recommended for clarification.  3. No midline shift or other mass effect.  4. No proximal intracranial arterial occlusion. Moderate narrowing of the M2 segments of both middle cerebral arteries.   Mr Cervical Spine Wo Contrast 01/19/2019 1. Motion degraded examination, particularly limiting assessment of the neural foramina.  2. Mild spinal canal stenosis at C5-6 and C6-7. 3. No acute abnormality of the cervical spine.   US Venous Img Upper Uni Right 01/18/2019 No evidence of DVT within the right upper extremity.   Transthoracic Echocardiogram  4/24//2020  1. The left ventricle has normal systolic function with an ejection fraction of 60-65%. The cavity size was normal. There is mildly increased left ventricular wall thickness. Left ventricular diastolic Doppler parameters are indeterminate. No evidence  of left ventricular regional wall motion abnormalities.  2. The right ventricle has moderately reduced systolic function. The cavity was mildly enlarged. There is no increase in right ventricular wall thickness. Right ventricular systolic pressure is moderately elevated with an estimated pressure of 41.7  mmHg.  3. Left atrial size was severely dilated.  4. Right atrial size was severely  dilated.  5. The mitral valve is normal in structure. There is mild mitral annular calcification present.  6. The tricuspid valve is normal in structure.  7. A 26 Edwards Sapien bioprosthetic aortic valve (TAVR) valve is present in the aortic position. Normal function observed with mean gradient 15.7 mmHg. No perivalvular leak.  8. The pulmonic valve was grossly normal. Pulmonic valve regurgitation is mild by color flow Doppler.  9. The aortic root is normal in size and structure. 10. The inferior vena cava was dilated in size with <50% respiratory variability. 11. No evidence of left ventricular regional wall motion abnormalities.  TEE 01/18/2019  1.  The left ventricle has normal systolic function, with an ejection fraction of 60-65%. No evidence of left ventricular regional wall motion abnormalities.  2. No evidence of left ventricular regional wall motion abnormalities.  3. Left atrial size was severely dilated. No left atrial appendage thrombus noted.  4. Right atrial size was severely dilated. No right atrial appendage thrombus noted.  5. The mitral valve is normal in structure. There is mild to moderate mitral annular calcification present.  6. The tricuspid valve was normal in structure. Tricuspid valve regurgitation is mild-moderate.  7. A 26 Edwards Sapien bioprosthetic aortic valve (TAVR) valve is present in the aortic position.  8. The right ventricle has moderately reduced systolic function. The cavity was mildly enlarged.  9. There is evidence of mild plaque in the descending aorta. 10. No valvular vegetations are identified.  Bilateral Carotid Dopplers  07/10/2018 Right Carotid: Velocities in the right ICA are consistent with a 1-39% stenosis. Left Carotid: Velocities in the left ICA are consistent with a 1-39% stenosis. Vertebrals:  Bilateral vertebral arteries demonstrate antegrade flow. Subclavians: Normal flow hemodynamics were seen in bilateral subclavian arteries.    PHYSICAL EXAM  pleasant middle-aged African-American male currently not in distress. . Afebrile. Head is nontraumatic. Neck is supple without bruit.    Cardiac exam no murmur or gallop. Lungs are clear to auscultation. Distal pulses are well felt.  Neurological Exam :  Awake alert oriented 3 with normal speech and language function. No aphasia or apraxia dysarthria. Extraocular moments are full range without nystagmus. Blinks to threat bilaterally. Fundi not visualized. Face is symmetric without weakness. Tongue midline. Motor system exam shows right hemiplegia with 3/5 strength with weakness of right grip, hip flexors and ankle dorsiflexors. Tone is  slightly diminished on the right compared to the left. Sensation is intact. Plantars are downgoing. Gait not tested.   ASSESSMENT/PLAN [name] is a 68 y.o. male with history of atrial fibrillation on anticoagulation, CHF, history of subdural hematoma, HLD, HTN, TAVR placement in 9/19, sleep apnea and recent sepsis presenting with persistent right sided weakness and multifocal infarcts seen on MRI brain. He did not receive IV t-PA due to unknown time of onset.  Strokes:  Multifocal infarcts throughout the cerebrum and cerebellum - embolic from afib  MRI head - Numerous scattered foci of acute ischemia throughout the cerebrum and cerebellum.   MRA head  - unremarkable  Abdominal US - Nodular contour of liver, this can be seen in cirrhosis of liver.   Carotid Doppler - 07/10/2018 - unremarkable  2D Echo / TEE  - EF 60 - 65%. No cardiac source of emboli identified. No vegetations.  TEE 2/27 did not show any evidence of vegetation   LDL - 70  HgbA1c - 5.8  UDS - not performed  VTE prophylaxis - SCDs  aspirin 81 mg  daily and Eliquis (apixaban) daily prior to admission, had changed Eliquis to aspirin but now on no antithrombotic given GI bleed.  He is at high risk for stroke off of Eliquis, but unable to take anticoagulation d/t hmg.   Therapy recommendations:  CLR  Disposition:  Pending  Chronic atrial Fibrillation  Home anticoagulation:  Eliquis (apixaban) daily   No anticoagulation at this time due to GI bleed  Hypertension Blood pressure somewhat low at times. Long-term BP goal normotensive  Hyperlipidemia  Lipid lowering medication PTA:  Lipitor 80 mg daily  LDL 70, goal < 70  Current lipid lowering medication: Lipitor 80 mg daily  Continue statin at discharge  Diabetes  HgbA1c 5.8, goal < 7.0  Controlled  Other Stroke Risk Factors  Advanced age  Former cigarette smoker - quit  Obesity, Body mass index is 37.26 kg/m., recommend weight loss,  diet and exercise as appropriate   Obstructive sleep apnea, on CPAP at home  Other Active Problems  Severe sepsis WBC 25.3  Acute GIB -with coffee-ground emesis, acute blood loss anemia.  Went for EGD [phone] with significant amount of blood clot in stomach and distal esophagus, unable to perform intervention.  Hemoglobin 7.2. Transfused.    Streptococcus bacteremia due to right lower extremity cellulitis  Acute home chronic kidney disease stage III - Creatinine 1.87  Cellulitis right lower extremity   Aspiration pneumonia   Hyperkalemia 5.5  US Abdomen Limited Ruq - Nodular contour of liver, this can be seen in cirrhosis of liver  Acute on chronic right shoulder pain  Hyperbilirubinemia  Right arm swelling Doppler negative for DVT  Hospital day # 10   The patient presents a challenging therapeutic dilemma. He clearly has had embolic strokes when eliquis was discontinued due to recent GI hemorrhage. He cannot go back on anticoagulation to the hemorrhage stops and the source is secured. Long discussion with the patient and family at the bedside and answered questions. Recommend repeat CT scan of the head when stable to transport and start aspirin per GI and if subarachnoid hemorrhage is improving may consider starting aspirin 81 mg if gastroenterology team is willing otherwise hold off. Greater than 50% time during this 25 minute visit was spent on counseling and coordination of care about his embolic stroke and discussion about risk benefits of anticoagulation and antiplatelet therapy in the setting of acute GI hemorrhage. Discuss with Dr. Dolphus Jenny.Stroke team will sign off. Kindly call for questions Delia Heady, MD Medical Director [name] Pager: [phone] 01/23/2019 3:33 PM   To contact Stroke Continuity provider, please refer to [email or web address]. After hours, contact General Neurology

## 2019-01-23 NOTE — Progress Notes (Signed)
Physical Therapy Treatment Patient Details Name: Joel Rogers MRN: [id number] DOB: 27-May-1951 Today's Date: 01/23/2019    History of Present Illness Pt is a 68 y/o male admitted 01/13/19 (originally to [facility]) secondary to R UE and R LE pain with sepsis due to streptococcus bacteremia and cellulitis. During admission, pt with persistent R sided weakness. An MRI of the brain revealed multiple acute ischemic infarcts in a pattern that is most consistent with cardioembolic strokes and pt was transferred to [facility] for further care.  Pt's stay further complicated by hematemesis who underwent upper GI endoscopy on 01/21/19 and 2 units of PRBCs due to ABLA.  PMH including but not limited to atrial fibrillation on anticoagulation, CHF, history of subdural hematoma, HLD, HTN, TAVR placement in 9/19 and sleep apnea.    PT Comments    Co session with OT completed with pt demonstrated continued R UE and LE weakness, however, his biggest limiting factor is significant R groin and R thigh pain even with bed mobility and sitting EOB.  This is the main reason we could not attempt standing today was his R hip and thigh pain was too significant.  He was lifted OOB to the recliner chair using the total lift.  PT will continue to follow acutely for safe mobility progression  Follow Up Recommendations  CIR     Equipment Recommendations  Wheelchair (measurements PT);Wheelchair cushion (measurements PT);Hospital bed;Other (comment)(hoyer lift)    Recommendations for Other Services   NA     Precautions / Restrictions Precautions Precautions: Fall Precaution Comments: R sided weakness and R hip/thigh pain Restrictions Weight Bearing Restrictions: No    Mobility  Bed Mobility Overal bed mobility: Needs Assistance Bed Mobility: Supine to Sit;Sit to Supine     Supine to sit: Max assist;+2 for physical assistance;+2 for safety/equipment;HOB elevated Sit to supine: +2 for physical  assistance;Total assist   General bed mobility comments: increased time and effort, heavy assistance needed for R LE movement off of bed, heavy assistance with trunk management  Transfers Overall transfer level: Needs assistance               General transfer comment: deferred attempt to stand from EOB due to pt with increased pain in RLE with ROM while sitting EOB; returned to supine and use of maxisky for totalA lift to chair         Balance Overall balance assessment: Needs assistance Sitting-balance support: Feet supported;Single extremity supported;Bilateral upper extremity supported Sitting balance-Leahy Scale: Fair Sitting balance - Comments: pt with improved static sitting balance today compared to previous session, able to maintain with close minguard-minA for increased period of time (approx 3-5 min), once pt having increased pain levels and when fatigued he requires up to maxA to maintain upright sitting posture  Postural control: Right lateral lean(with fatigue in sitting EOB)                                  Cognition Arousal/Alertness: Awake/alert Behavior During Therapy: WFL for tasks assessed/performed Overall Cognitive Status: Within Functional Limits for tasks assessed                                        Exercises General Exercises - Lower Extremity Ankle Circles/Pumps: AROM;Both(encouraged frequently. )    General Comments General comments (skin integrity,  edema, etc.): Pt's son pressent and observing session, VSS despite BPs being soft (MAP maintained >60).  Pt nauseated and dry heaving throughout our session.  RN aware.      Pertinent Vitals/Pain Pain Assessment: Faces Faces Pain Scale: Hurts even more Pain Location: R hip Pain Descriptors / Indicators: Guarding;Sore Pain Intervention(s): Limited activity within patient's tolerance;Monitored during session;Repositioned       Prior Function            PT  Goals (current goals can now be found in the care plan section) Acute Rehab PT Goals Patient Stated Goal: return home after rehab Progress towards PT goals: Progressing toward goals    Frequency    Min 4X/week      PT Plan Current plan remains appropriate    Co-evaluation PT/OT/SLP Co-Evaluation/Treatment: Yes Reason for Co-Treatment: Complexity of the patient's impairments (multi-system involvement);For patient/therapist safety;To address functional/ADL transfers PT goals addressed during session: Mobility/safety with mobility;Balance;Strengthening/ROM OT goals addressed during session: ADL's and self-care;Strengthening/ROM      AM-PAC PT "6 Clicks" Mobility   Outcome Measure  Help needed turning from your back to your side while in a flat bed without using bedrails?: Total Help needed moving from lying on your back to sitting on the side of a flat bed without using bedrails?: Total Help needed moving to and from a bed to a chair (including a wheelchair)?: Total Help needed standing up from a chair using your arms (e.g., wheelchair or bedside chair)?: Total Help needed to walk in hospital room?: Total Help needed climbing 3-5 steps with a railing? : Total 6 Click Score: 6    End of Session   Activity Tolerance: Patient limited by pain Patient left: in chair;with call bell/phone within reach;with family/visitor present Nurse Communication: Mobility status;Need for lift equipment PT Visit Diagnosis: Unsteadiness on feet (R26.81);Other abnormalities of gait and mobility (R26.89);Muscle weakness (generalized) (M62.81);Pain Pain - Right/Left: Right Pain - part of body: Hip     Time: 4103-0131 PT Time Calculation (min) (ACUTE ONLY): 40 min  Charges:  $Therapeutic Activity: 23-37 mins                    Mcguire Gasparyan B. Hjalmar Ballengee, PT, DPT  Acute Rehabilitation [phone] pager #[phone] office   01/23/2019, 2:48 PM

## 2019-01-24 ENCOUNTER — Encounter: Payer: Self-pay | Admitting: Family Medicine

## 2019-01-24 ENCOUNTER — Inpatient Hospital Stay (HOSPITAL_COMMUNITY): Payer: Medicare Other

## 2019-01-24 DIAGNOSIS — Z8679 Personal history of other diseases of the circulatory system: Secondary | ICD-10-CM

## 2019-01-24 LAB — COMPREHENSIVE METABOLIC PANEL
AST: 20 U/L (ref 15–41)
Albumin: 2.2 g/dL — ABNORMAL LOW (ref 3.5–5.0)
Alkaline Phosphatase: 71 U/L (ref 38–126)
Anion gap: 6 (ref 5–15)
BUN: 56 mg/dL — ABNORMAL HIGH (ref 8–23)
CALCIUM: 8.1 mg/dL — AB (ref 8.9–10.3)
CO2: 20 mmol/L — ABNORMAL LOW (ref 22–32)
CREATININE: 1.74 mg/dL — AB (ref 0.61–1.24)
Chloride: 119 mmol/L — ABNORMAL HIGH (ref 98–111)
GFR calc Af Amer: 46 mL/min — ABNORMAL LOW (ref >60)
GFR calc non Af Amer: 39 mL/min — ABNORMAL LOW (ref >60)
Glucose, Bld: 147 mg/dL — ABNORMAL HIGH (ref 70–99)
Potassium: 3.8 mmol/L (ref 3.5–5.1)
Sodium: 145 mmol/L (ref 135–145)
Total Bilirubin: 1 mg/dL (ref 0.3–1.2)
Total Protein: 6.6 g/dL (ref 6.5–8.1)

## 2019-01-24 LAB — GLUCOSE, CAPILLARY: Glucose-Capillary: 116 mg/dL — ABNORMAL HIGH (ref 70–99)

## 2019-01-24 LAB — CBC
HCT: 27.4 % — ABNORMAL LOW (ref 39.0–52.0)
Hemoglobin: 8.9 g/dL — ABNORMAL LOW (ref 13.0–17.0)
MCH: 26.3 pg (ref 26.0–34.0)
MCHC: 32.5 g/dL (ref 30.0–36.0)
MCV: 80.8 fL (ref 80.0–100.0)
Platelets: 173 10*3/uL (ref 150–400)
RBC: 3.39 MIL/uL — ABNORMAL LOW (ref 4.22–5.81)
RDW: 20.7 % — ABNORMAL HIGH (ref 11.5–15.5)
WBC: 20.6 10*3/uL — ABNORMAL HIGH (ref 4.0–10.5)
nRBC: 1.2 % — ABNORMAL HIGH (ref 0.0–0.2)

## 2019-01-24 LAB — APTT: aPTT: 31 seconds (ref 24–36)

## 2019-01-24 LAB — PROTIME-INR
INR: 1.3 — ABNORMAL HIGH (ref 0.8–1.2)
Prothrombin Time: 15.6 seconds — ABNORMAL HIGH (ref 11.4–15.2)

## 2019-01-24 MED ORDER — WHITE PETROLATUM EX OINT
TOPICAL_OINTMENT | CUTANEOUS | Status: AC
  Administered 2019-01-24: 0.2
  Filled 2019-01-24: qty 28.35

## 2019-01-24 NOTE — Progress Notes (Signed)
Generally doing well from GI perspective.  No acute abdominal complaints.  No stool output or hematemesis.  Is spitting up secretions that are minimally blood tinged.  Hemoglobin stable, without need for further transfusion.  BUN and creatinine improving although still elevated.  Nephrology input reviewed and appreciated.  Since patient's bleeding appears to have stopped, hopefully an emergent radiographic intervention requiring IV contrast, for embolization and bleeding controlled, will not be needed, thereby giving more time for the kidneys to recover.  Remains on octreotide infusion, as well as IV pantoprazole boluses.  Because of frequent spitting up of secretions, aspiration pneumonia, and recent CVA, the ICU nurse is concerned about possible oropharyngeal dysphagia.  Impression:  1.  Upper GI bleed status post gastric bypass, exact anatomy not clear, source of bleeding indeterminate (see endoscopy note from Monday), apparently quiescent at this time.  2.  Posthemorrhagic anemia, stable.  3.  Concern regarding possible aspiration/oropharyngeal dysphagia.  4.  Acute on chronic renal insufficiency  Plan:  1.  Continue octreotide for 1 more day.  Continue PPI. 2.  Consider repeat endoscopy in a couple of days to see if the source of the bleeding can be ascertained, once blood and clot have sufficiently exited from the stomach 3.  Speech pathology evaluation requested.  Will hold off on dietary advancement until that is completed.  Florencia Reasons, M.D. Pager [phone] If no answer or after 5 PM call [phone]

## 2019-01-24 NOTE — Evaluation (Signed)
Clinical/Bedside Swallow Evaluation Patient Details  Name: Joel Rogers MRN: [id number] Date of Birth: 02-05-1951  Today's Date: 01/24/2019 Time: SLP Start Time (ACUTE ONLY): 1409 SLP Stop Time (ACUTE ONLY): 1432 SLP Time Calculation (min) (ACUTE ONLY): 23 min  Past Medical History:  Past Medical History:  Diagnosis Date  . Anemia    low iron  . Arthritis    bilateral knees  . Atrial fibrillation, chronic   . Chronic diastolic CHF (congestive heart failure) (HCC) 06/15/2018  . History of subdural hematoma   . Hyperlipidemia   . Hypertension   . Morbid obesity (HCC)   . Normal coronary arteries    by cardiac catheterization performed 03/14/06  . Pre-diabetes    pt denies  . S/P TAVR (transcatheter aortic valve replacement)    a. 07/25/18: Edwards Sapien 3 THV (size 26 mm, model # 9600TFX, serial # [id number]) by Dr. Laneta Simmers and Dr. Clifton James  . Sleep apnea    on CPAP  . Venous insufficiency    Past Surgical History:  Past Surgical History:  Procedure Laterality Date  . CRANIOTOMY Right 08/27/2016   Procedure: CRANIOTOMY HEMATOMA EVACUATION SUBDURAL;  Surgeon: Maeola Harman, MD;  Location: [facility] OR;  Service: Neurosurgery;  Laterality: Right;  . ESOPHAGOGASTRODUODENOSCOPY N/A 01/21/2019   Procedure: ESOPHAGOGASTRODUODENOSCOPY (EGD);  Surgeon: Vida Rigger, MD;  Location: [facility] ENDOSCOPY;  Service: Endoscopy;  Laterality: N/A;  . ESOPHAGOGASTRODUODENOSCOPY (EGD) WITH PROPOFOL N/A 01/22/2019   Procedure: ESOPHAGOGASTRODUODENOSCOPY (EGD) WITH PROPOFOL;  Surgeon: Bernette Redbird, MD;  Location: [facility] ENDOSCOPY;  Service: Endoscopy;  Laterality: N/A;  . GASTRIC BYPASS  09/2008  . JOINT REPLACEMENT  08/31/2006   right hip  . MULTIPLE EXTRACTIONS WITH ALVEOLOPLASTY N/A 07/13/2018   Procedure: Extraction of tooth #'s 3,8,10, 23-26, 30and 32 with alveoloplasty and gross debridement of remaining teeth;  Surgeon: Charlynne Pander, DDS;  Location: [facility] OR;  Service: Oral Surgery;  Laterality: N/A;  . RIGHT  HEART CATH N/A 07/06/2018   Procedure: RIGHT HEART CATH;  Surgeon: Kathleene Hazel, MD;  Location: [facility] INVASIVE CV LAB;  Service: Cardiovascular;  Laterality: N/A;  . RIGHT/LEFT HEART CATH AND CORONARY ANGIOGRAPHY N/A 07/10/2018   Procedure: RIGHT/LEFT HEART CATH AND CORONARY ANGIOGRAPHY;  Surgeon: Dolores Patty, MD;  Location: [facility] INVASIVE CV LAB;  Service: Cardiovascular;  Laterality: N/A;  . TEE WITHOUT CARDIOVERSION N/A 07/25/2018   Procedure: TRANSESOPHAGEAL ECHOCARDIOGRAM (TEE);  Surgeon: Kathleene Hazel, MD;  Location: [facility] OR;  Service: Open Heart Surgery;  Laterality: N/A;  . TEE WITHOUT CARDIOVERSION N/A 01/18/2019   Procedure: TRANSESOPHAGEAL ECHOCARDIOGRAM (TEE) WITH PROPOFOL;  Surgeon: Jonelle Sidle, MD;  Location: [facility] ORS;  Service: Cardiovascular;  Laterality: N/A;  . TOTAL HIP ARTHROPLASTY     right hip  . TRANSCATHETER AORTIC VALVE REPLACEMENT, TRANSFEMORAL  07/25/2018  . TRANSCATHETER AORTIC VALVE REPLACEMENT, TRANSFEMORAL N/A 07/25/2018   Procedure: TRANSCATHETER AORTIC VALVE REPLACEMENT, TRANSFEMORAL;  Surgeon: Kathleene Hazel, MD;  Location: [facility] OR;  Service: Open Heart Surgery;  Laterality: N/A;   HPI:  Pt is a 68 y/o male admitted 01/13/19 (originally to [facility]) secondary to R UE and R LE pain with sepsis due to streptococcus bacteremia and cellulitis. During admission, pt with persistent R sided weakness. An MRI of the brain revealed multiple acute ischemic infarcts in a pattern that is most consistent with cardioembolic strokes and pt was transferred to [facility] for further care.  Pt's stay further complicated by hematemesis who underwent upper GI endoscopy on 01/21/19 and 2  units of PRBCs due to ABLA.  PMH including but not limited to atrial fibrillation on anticoagulation, CHF, history of subdural hematoma, HLD, HTN, TAVR placement in 9/19, sleep apnea, gastric bipass. Bedside swallow evaluation ordered secondary to pt frequently bringing up  secretions, concerns for aspiration pna, and recent CVA.     Assessment / Plan / Recommendation Clinical Impression  Pt was alert, participatory and asking for water.  Cranial nerve function WNL bilaterally. Pt was presented with water and applesauce. He demonstrated adequate oral preparation of all consistencies. Three ounces of water were presented; pt unable to consume more than two-three boluses before stating that he was "too full" to have any more. Voice remained strong/clear throughout assessment.  Pt encouraged to drink more; he had only minimal further water before again expressing satiety.  There were no s/s of aspiration throughout assessment.  After leaving room and pt was preparing for PT, he requested emesis basin due to concerns he would regurgitate.  Doubt oropharyngeal origin of deficits. Per notes, 3/2 endoscopy revealed ongoing presence of clotted blood in esophagus/stomach - this may have obscured esophageal abnormalites per report.  Recommend resuming ice chips/water; no SLP f/u for swallowing is warranted given absence of oropharyngeal findings. Defer to GI for ongoing esophageal follow-up and diet advancement.  D/W RN.   SLP Visit Diagnosis: Dysphagia, unspecified (R13.10)    Aspiration Risk       Diet Recommendation   sips and chips  Medication Administration: Whole meds with liquid    Other  Recommendations Oral Care Recommendations: Oral care QID   Follow up Recommendations Other (comment)(none for swallowing)      Frequency and Duration            Prognosis        Swallow Study   General Date of Onset: 01/13/19 HPI: Pt is a 68 y/o male admitted 01/13/19 (originally to [facility]) secondary to R UE and R LE pain with sepsis due to streptococcus bacteremia and cellulitis. During admission, pt with persistent R sided weakness. An MRI of the brain revealed multiple acute ischemic infarcts in a pattern that is most consistent with cardioembolic strokes and  pt was transferred to [facility] for further care.  Pt's stay further complicated by hematemesis who underwent upper GI endoscopy on 01/21/19 and 2 units of PRBCs due to ABLA.  PMH including but not limited to atrial fibrillation on anticoagulation, CHF, history of subdural hematoma, HLD, HTN, TAVR placement in 9/19 and sleep apnea. Bedside swallow evaluation ordered secondary to pt frequently bringing up secretions, concerns for aspiration pna, and recent CVA.  Type of Study: Bedside Swallow Evaluation Previous Swallow Assessment: no Diet Prior to this Study: Other (Comment)(chips and sips) Temperature Spikes Noted: No Respiratory Status: Room air History of Recent Intubation: No Behavior/Cognition: Alert;Cooperative;Pleasant mood Oral Cavity Assessment: Within Functional Limits Oral Care Completed by SLP: No Oral Cavity - Dentition: Missing dentition Vision: Functional for self-feeding Self-Feeding Abilities: Able to feed self Patient Positioning: Upright in bed Baseline Vocal Quality: Normal Volitional Cough: Strong Volitional Swallow: Able to elicit    Oral/Motor/Sensory Function Overall Oral Motor/Sensory Function: Within functional limits   Ice Chips Ice chips: Within functional limits   Thin Liquid Thin Liquid: Within functional limits    Nectar Thick Nectar Thick Liquid: Not tested   Honey Thick Honey Thick Liquid: Not tested   Puree Puree: Within functional limits   Solid     Solid: Not tested  Blenda Mounts Joel Rogers 01/24/2019,2:56 PM  Marchelle Folks L. Samson Frederic, MA CCC/SLP Acute Rehabilitation Services Office number [phone] Pager [phone]

## 2019-01-24 NOTE — Clinical Social Work Note (Signed)
Clinical Social Work Assessment  Patient Details  Name: Joel Rogers MRN: [id number] Date of Birth: 17-Jul-1951  Date of referral:  01/24/19               Reason for consult:  Facility Placement, Discharge Planning                Permission sought to share information with:    Permission granted to share information::  Yes, Verbal Permission Granted  Name::     Joel Rogers   Agency::  family  Relationship::  spouse  Contact Information:  Joel Rogers [phone]  Housing/Transportation Living arrangements for the past 2 months:  Single Family Home(with wife. ) Source of Information:  Patient Patient Interpreter Needed:  None Criminal Activity/Legal Involvement Pertinent to Current Situation/Hospitalization:  No - Comment as needed Significant Relationships:  Adult Children, Other Family Members, Siblings, Spouse Lives with:  Spouse Do you feel safe going back to the place where you live?  Yes Need for family participation in patient care:  Yes (Comment)  Care giving concerns:  CSW consulted as pt is being looked at by CIR. CSW spoke with pt at bedside to discuss SNF as a backup plan to CIR.    Social Worker assessment / plan:  CSW spoke with pt at bedside. CSW entered room where pt and other relatives sat. CSW explained role and reason for visit. CSW began conversation with pt by expressing that CSW was here to speak with pt about options outside for CIR. CSW was informed that pt is from home with wife. Pt reports hat wife is a positive support for him and that they help take care of one another daily.    Pt and sister both reported that their first choice is for pt to to go to CIR. CSW advised that CSW would follow along to see what other suggestions are made for pt. Pt appeared to be understanding and agreeable to plan at this time.   Employment status:  Retired Database administrator PT Recommendations:  Skilled Holiday representative, Inpatient Rehab  Consult Information / Referral to community resources:  Skilled Nursing Facility  Patient/Family's Response to care:  Pt appeared to be understanding and agreeable to plan of care at this time.   Patient/Family's Understanding of and Emotional Response to Diagnosis, Current Treatment, and Prognosis:  No further questions or concerns have been presented to CSW at this time.    Emotional Assessment Appearance:  Appears stated age Attitude/Demeanor/Rapport:  Engaged Affect (typically observed):  Accepting, Adaptable, Hopeful, Happy Orientation:  Oriented to Place, Oriented to Self, Oriented to  Time, Oriented to Situation Alcohol / Substance use:  Not Applicable Psych involvement (Current and /or in the community):  No (Comment)  Discharge Needs  Concerns to be addressed:  Denies Needs/Concerns at this time Readmission within the last 30 days:  No Current discharge risk:  Dependent with Mobility Barriers to Discharge:  Continued Medical Work up   [employer], LCSWA 01/24/2019, 1:34 PM

## 2019-01-24 NOTE — Progress Notes (Signed)
Physical Therapy Treatment Patient Details Name: Joel Rogers MRN: [id number] DOB: 1951/01/21 Today's Date: 01/24/2019    History of Present Illness Pt is a 68 y/o male admitted 01/13/19 (originally to [facility]) secondary to R UE and R LE pain with sepsis due to streptococcus bacteremia and cellulitis. During admission, pt with persistent R sided weakness. An MRI of the brain revealed multiple acute ischemic infarcts in a pattern that is most consistent with cardioembolic strokes and pt was transferred to [facility] for further care.  Pt's stay further complicated by hematemesis who underwent upper GI endoscopy on 01/21/19 and 2 units of PRBCs due to ABLA.  PMH including but not limited to atrial fibrillation on anticoagulation, CHF, history of subdural hematoma, HLD, HTN, TAVR placement in 9/19 and sleep apnea.    PT Comments    Patient progressing with PT, tolerating sitting EOB to work with therex with less assistance today. Cont to rec CIR, patient very eager to improve. May be ready to attempt standing with Stedy next visit.    Follow Up Recommendations  CIR     Equipment Recommendations  Wheelchair (measurements PT);Wheelchair cushion (measurements PT);Hospital bed;Other (comment)    Recommendations for Other Services Rehab consult     Precautions / Restrictions Precautions Precautions: Fall Precaution Comments: R sided weakness and R hip/thigh pain Restrictions Weight Bearing Restrictions: No    Mobility  Bed Mobility Overal bed mobility: Needs Assistance Bed Mobility: Supine to Sit;Sit to Supine     Supine to sit: Max assist;+2 for physical assistance;+2 for safety/equipment;HOB elevated Sit to supine: +2 for physical assistance;Max assist      Transfers Overall transfer level: Needs assistance Equipment used: Rolling walker (2 wheeled) Transfers: Sit to/from [employer] Sit to Stand: Mod assist;Max assist Stand pivot transfers: Mod  assist;Max assist       General transfer comment: mod to max to come to sitting once sitting can hold himself on EOB without external support. using LUE to hold onto rail. worked with balance and R UE +R LE exercises EOB  Ambulation/Gait                 Stairs             Wheelchair Mobility    Modified Rankin (Stroke Patients Only)       Balance Overall balance assessment: Needs assistance Sitting-balance support: Feet supported;Single extremity supported;Bilateral upper extremity supported Sitting balance-Leahy Scale: Fair                                      Cognition Arousal/Alertness: Awake/alert Behavior During Therapy: WFL for tasks assessed/performed Overall Cognitive Status: Impaired/Different from baseline                                 General Comments: pt is very sweet, pleasant and motivated to work with therapy      Exercises      General Comments        Pertinent Vitals/Pain Pain Assessment: No/denies pain Faces Pain Scale: Hurts even more Pain Location: R LE Pain Descriptors / Indicators: Guarding;Sore Pain Intervention(s): Limited activity within patient's tolerance    Home Living                      Prior Function  PT Goals (current goals can now be found in the care plan section) Acute Rehab PT Goals Patient Stated Goal: return home after rehab PT Goal Formulation: With patient/family Time For Goal Achievement: 01/29/19 Potential to Achieve Goals: Good Progress towards PT goals: Progressing toward goals    Frequency    Min 4X/week      PT Plan Current plan remains appropriate    Co-evaluation              AM-PAC PT "6 Clicks" Mobility   Outcome Measure  Help needed turning from your back to your side while in a flat bed without using bedrails?: A Lot Help needed moving from lying on your back to sitting on the side of a flat bed without using bedrails?:  A Lot Help needed moving to and from a bed to a chair (including a wheelchair)?: Total Help needed standing up from a chair using your arms (e.g., wheelchair or bedside chair)?: Total Help needed to walk in hospital room?: Total Help needed climbing 3-5 steps with a railing? : Total 6 Click Score: 8    End of Session Equipment Utilized During Treatment: Gait belt Activity Tolerance: Patient limited by pain Patient left: in chair;with call bell/phone within reach;with family/visitor present Nurse Communication: Mobility status;Need for lift equipment PT Visit Diagnosis: Unsteadiness on feet (R26.81);Other abnormalities of gait and mobility (R26.89);Muscle weakness (generalized) (M62.81);Pain Pain - Right/Left: Right Pain - part of body: Hip     Time: 3567-0141 PT Time Calculation (min) (ACUTE ONLY): 39 min  Charges:  $Therapeutic Activity: 23-37 mins                     Etta Grandchild, PT, DPT Acute Rehabilitation Services Pager: [phone] Office: [phone]     Etta Grandchild 01/24/2019, 5:44 PM

## 2019-01-24 NOTE — Progress Notes (Signed)
[place] TEAM 1 - Stepdown/ICU TEAM  ANTWAIN DAM  PNT:[id number] DOB: 1950/12/01 DOA: 01/13/2019 PCP: [name], MD    Brief Narrative:  68 y/o admitted to [facility] w/ sepsis and Streptococcal bacteremia due to R LE cellulitis. He was treated with IV antibiotics and improved. Due to his hx of bioprosthetic aortic valve, TEE was performed, and did not note vegetations. Since admission he was noted to have persistent right-sided weakness. An MRI brain that showed bilateral CVAs.  He was transferred to [facility] 2/29 for a Neurology evaluation. On 3/1 he developed coffee-ground emesis w/ acute blood loss anemia. At Endoscopy 01/21/2019, and again on 3/2 he was found to have a significant amount of blood/blood clot in the stomach and distal esophagus, such that an intervention could not be performed.  Significant Events: 2/22 admit to [facility] w/ sepsis / bacteremia  2/27 TEE > no vegetations  2/29 transfer to [facility]  3/1 EGD 3/2 EGD  Subjective: Patient seen alongside patient's nurse.  No new complaints.  No further GI bleeding reported.    Assessment & Plan:  Upper GI bleed of indeterminate origin: coffee-ground emesis - EGD 3/1 and 3/2 both significant for large amount of blood/blood clot in the stomach with poor visualization - empiric protonix and octreotide drips, though no known hx of esophageal varices, alcohol abuse - evidence of liver "cirrhosis" noted on CT in 2019, but reportedly no clinical features previously to suggest this - for the moment the bleeding appears to have ceased  01/24/2019: No further GI bleeding reported.  Hemoglobin is stable at 8.9 g/dL today.  Continue to monitor closely.  Acute blood loss anemia: transfused 6 units PRBC thus far - Hgb stable thus far today - follow in serial fashion - keep Hgb 8.0 or > in this clinical situation  01/24/2019: Stable.  Severe sepsis - Streptococcus bacteremia Due to right lower extremity cellulitis - 1 of 2 positive blood cultures  for group C Streptococcus - TTE w/o evidence of vegetations - has bioprosthetic aortic valve therefore TEE on 2/27 did not show vegetations - PICC line placed - complete a 2 week course of antibiotics - cellulitis appears resolved on exam   Aspiration pneumonia CXR noted left lung base opacity, likely aspiration - abx changed to Zosyn to complete 5 days   Hyperkalemia Corrected   AKI on CKD stage III baseline creatinine 1.2 - crt still slowly climbing, likely due to hemorrhage - maximize BP - avoid nephrotoxins - monitor w/ time   01/24/2019: AKI is resolving.  Neurology input is highly appreciated.  Multifocal presumed embolic Acute CVAs right-sided weakness noted > MRI noted bilateral infarcts - felt to be secondary to embolic stroke related to holding Eliquis due to thrombocytopenia - MRA head was unremarkable - TEE with no embolic source identified   Chronic atrial fibrillation chronically on Eliquis - held initially due to severe thrombocytopenia, and now due to active GI bleed - rate controlled  Small SAH  Noted on CT head 2/29 - will need repeat CT head before consideration of resuming anticoag - if mental status changes abruptly will need STAT f/u CT   Thrombocytopenia Initial severe thrombocytopenia felt secondary to sepsis - was trending upward, but now trending back down, likely due to consumption in setting of GIB   Status post TAVR Sept 2019  followed by Dr. Allyson Sabal   Acute on chronic right shoulder pain plain film XR notes arthritic change in the shoulder - no exam findings to  suggest septic arthritis   Hx Rouen-Y Gastric Bypass   HTN blood pressure stable   Right arm swelling.  Venous doppler negative for DVT  DVT prophylaxis: SCDs Code Status: FULL CODE Family Communication: no family present at time of exam  Disposition Plan: This will depend on hospital course.  Consultants:  Cardiology for TEE at [facility]  Surgery Interventional radiology Nephrology  Antimicrobials:  Cefepime 2/22 > 2/24 Vancomycin 2/22 > 2/25 Metronidazole 2/22 > 2/13 Ancef 2/25 > 3/1 Zosyn 3/1 >   Objective: Blood pressure 133/83, pulse (!) 57, temperature 97.9 F (36.6 C), temperature source Oral, resp. rate 15, height  (1.854 m), weight 128.1 kg, SpO2 99 %.  Intake/Output Summary (Last 24 hours) at 01/24/2019 1023 Last data filed at 01/24/2019 1000 Gross per 24 hour  Intake 3774.95 ml  Output 1325 ml  Net 2449.95 ml   Filed Weights   01/19/19 2106 01/20/19 0423 01/22/19 0802  Weight: 123.3 kg 128.1 kg 128.1 kg    Examination: General: No acute respiratory distress Lungs: Clear to auscultation bilaterally without wheezes or crackles Cardiovascular: irreg irreg - rate controlled - no rub  Abdomen: Nontender, nondistended, soft, bowel sounds positive, no rebound, no ascites, no appreciable mass Extremities: 1+ B UE edema - trace B LE edema  Neuro: Patient is awake and alert.  Power right upper and lower extremities 3 out of 5.  Power left upper and lower extremities 3+ out of 5.  CBC: Recent Labs  Lab 01/18/19 0424 01/19/19 0526  01/23/19 1206 01/23/19 1755 01/24/19 0606  WBC 20.9* 17.3*   < > 21.1* 21.2* 20.6*  NEUTROABS 15.4* 12.5*  --   --   --   --   HGB 10.4* 10.0*   < > 8.6* 9.0* 8.9*  HCT 30.4* 29.8*   < > 26.7* 26.8* 27.4*  MCV 68.0* 70.3*   < > 79.7* 80.0 80.8  PLT 153 224   < > 181 179 173   < > = values in this interval not displayed.   Basic Metabolic Panel: Recent Labs  Lab 01/22/19 0217 01/23/19 0354 01/24/19 0606  NA 139 142 145  K 5.5* 4.7 3.8  CL 115* 118* 119*  CO2 15* 17* 20*  GLUCOSE 126* 119* 147*  BUN 71* 72* 56*  CREATININE 1.87* 1.95* 1.74*  CALCIUM 7.5* 8.0* 8.1*   GFR: Estimated Creatinine Clearance: 57 mL/min (A) (by C-G formula based on SCr of 1.74 mg/dL (H)).  Liver Function Tests: Recent Labs  Lab 01/18/19 0424 01/19/19 0526 01/23/19 0354  01/24/19 0606  AST 26 26 71* 20  ALT <5  ALKPHOS 105 102 68 71  BILITOT 1.4* 1.3* 0.9 1.0  PROT 6.1* 6.3* 6.2* 6.6  ALBUMIN 2.1* 2.0* 2.1* 2.2*    HbA1C: Hemoglobin A1C  Date/Time Value Ref Range Status  01/22/2016 6.2  Final   Hgb A1c MFr Bld  Date/Time Value Ref Range Status  07/21/2018 10:20 AM 5.8 (H) 4.8 - 5.6 % Final    Comment:    (NOTE) Pre diabetes:          5.7%-6.4% Diabetes:              >6.4% Glycemic control for   <7.0% adults with diabetes   11/26/2016 02:06 PM 5.6 <5.7 % Final    Comment:      For the purpose of screening for the presence of diabetes:   <5.7%  Consistent with the absence of diabetes 5.7-6.4 %   Consistent with increased risk for diabetes (prediabetes) >=6.5 %     Consistent with diabetes   This assay result is consistent with a decreased risk of diabetes.   Currently, no consensus exists regarding use of hemoglobin A1c for diagnosis of diabetes in children.   According to American Diabetes Association (ADA) guidelines, hemoglobin A1c <7.0% represents optimal control in non-pregnant diabetic patients. Different metrics may apply to specific patient populations. Standards of Medical Care in Diabetes (ADA).       CBG: Recent Labs  Lab 01/22/19 0038 01/22/19 0404 01/22/19 0832 01/22/19 1247 01/22/19 1520  GLUCAP 114* 112* 107* 99 100*    Recent Results (from the past 240 hour(s))  Culture, blood (Routine X 2) w Reflex to ID Panel     Status: None   Collection Time: 01/15/19  8:52 AM  Result Value Ref Range Status   Specimen Description BLOOD LEFT FOREARM  Final   Special Requests   Final    BOTTLES DRAWN AEROBIC AND ANAEROBIC Blood Culture adequate volume   Culture   Final    NO GROWTH 5 DAYS Performed at [facility], [address], [place], [name] [address]    Report Status 01/20/2019 FINAL  Final  Culture, blood (Routine X 2) w Reflex to ID Panel     Status: None   Collection Time: 01/15/19  9:05 AM   Result Value Ref Range Status   Specimen Description BLOOD LEFT HAND  Final   Special Requests   Final    BOTTLES DRAWN AEROBIC AND ANAEROBIC Blood Culture results may not be optimal due to an excessive volume of blood received in culture bottles   Culture   Final    NO GROWTH 5 DAYS Performed at [facility], [address] [place], [name] [address]    Report Status 01/20/2019 FINAL  Final  MRSA PCR Screening     Status: None   Collection Time: 01/22/19  3:22 PM  Result Value Ref Range Status   MRSA by PCR NEGATIVE NEGATIVE Final    Comment:        The GeneXpert MRSA Assay (FDA approved for NASAL specimens only), is one component of a comprehensive MRSA colonization surveillance program. It is not intended to diagnose MRSA infection nor to guide or monitor treatment for MRSA infections. Performed at [facility], [address] [place], [name] [address]      Scheduled Meds: . atorvastatin  80 mg Oral q1800  . pantoprazole  40 mg Intravenous Q12H  . sodium chloride flush  10-40 mL Intracatheter Q12H   Continuous Infusions: . sodium chloride Stopped (01/23/19 1448)  . octreotide  (SANDOSTATIN)    IV infusion 50 mcg/hr (01/24/19 0134)  . piperacillin-tazobactam (ZOSYN)  IV 3.375 g (01/24/19 0609)     LOS: 11 days   Berton Mount, MD [facility] Office  [phone] Pager - Text Page per Amion  If 7PM-7AM, please contact night-coverage per Amion 01/24/2019, 10:23 AM

## 2019-01-24 NOTE — Progress Notes (Signed)
CSW aware that pt is being looked at by CIR at this time. CSW also made aware that per chart review pt was suppose to d/c to [facility] at one point and time during his[tial stay. CSW followed up with Jeri Lager with [facility] to see if she has information on pt or to send updated information to her. CSW following or SNF placement as a back up in the event that pt is unable to go to CIR.     Claude Manges Diara Chaudhari, MSW, LCSW-A Emergency Department Clinical Social Worker [phone]

## 2019-01-24 NOTE — Progress Notes (Signed)
I responded to a request from family members to provide spiritual support for the patient. I visited the patient's room and provided spiritual support by sharing words of encouragement, compassionate listening, and by leading in prayer. I shared that the Chaplain is available for additional support as needed or requested.    01/24/19 1053  Clinical Encounter Type  Visited With Patient  Visit Type Initial;Spiritual support  Referral From Family  Consult/Referral To Chaplain  Spiritual Encounters  Spiritual Needs Prayer  Stress Factors  Patient Stress Factors None identified    Chaplain Dr Melvyn Novas

## 2019-01-24 NOTE — Progress Notes (Signed)
[facility] NEPHROLOGY PROGRESS NOTE  Assessment/ Plan: Pt is a 68 y.o. yo male male with A. fib, hypertension, hyperlipidemia, gastric bypass, history of TAVR initially admitted to [facility] with sepsis and a streptococcal bacteremia due to right lower extremity cellulitis treated with antibiotics, TEE negative for vegetation.  Patient developed a stroke during hospitalization.  Developed GI bleeding requiring emergent endoscopy and multiple units of blood transfusion.  We are consulted for evaluation of elevated creatinine level.  #Acute kidney injury, non-oliguric: likely ischemic ATN in the setting of sepsis, hemodynamic compromise due to severe anemia and GIB.  -Urine study with sub-nephrotic range proteinuria  -Ultrasound kidneys with no acute finding or obstruction. -Serum creatinine level improving to 1.7 and CO2 level 20 today.  Patient is nonoliguric.  I will discontinue sodium bicarbonate IV fluid. --If patient needs IV contrast then I would recommend to minimize contrast dose during angiogram. -Monitor urine output and BMP.  #Metabolic acidosis: CO2 improved.  Discontinue sodium bicarbonate.  #Hypertension: Blood pressure acceptable.  Continue to monitor.  #Acute blood loss anemia due to GI bleeding: Status post multiple blood cell transfusion.  Currently on octreotide and PPI per GI.  Endoscopy unable to locate site of bleeding.  No further bleeding.  #Severe sepsis due to a streptococcal bacteremia: TEE with no vegetation.  On IV Zosyn.  #Acute multifocal embolic stroke: Seen by neurology.  Eliquis on hold because of GI bleed.  Patient has good urine output and serum creatinine level improving.  I will sign off at this time, call us back with any question.  Discussed with ICU team.   Subjective: Seen and examined at ICU.  No new event.  He made around 1450 cc of urine output in 24 hours. Objective Vital signs in last 24 hours: Vitals:   01/24/19 0600 01/24/19  0700 01/24/19 0800 01/24/19 0900  BP: (!) 145/90 (!) 153/85 (!) 151/98 (!) 154/81  Pulse: 63 71 (!) 43 (!) 54  Resp: 19 19 18 15   Temp:  97.9 F (36.6 C)    TempSrc:  Oral    SpO2: 98% 100% 100% 100%  Weight:      Height:       Weight change:   Intake/Output Summary (Last 24 hours) at 01/24/2019 1000 Last data filed at 01/24/2019 0800 Gross per 24 hour  Intake 3549.95 ml  Output 1000 ml  Net 2549.95 ml       Labs: Basic Metabolic Panel: Recent Labs  Lab 01/22/19 0217 01/23/19 0354 01/24/19 0606  NA 139 142 145  K 5.5* 4.7 3.8  CL 115* 118* 119*  CO2 15* 17* 20*  GLUCOSE 126* 119* 147*  BUN 71* 72* 56*  CREATININE 1.87* 1.95* 1.74*  CALCIUM 7.5* 8.0* 8.1*   Liver Function Tests: Recent Labs  Lab 01/19/19 0526 01/23/19 0354 01/24/19 0606  AST 26 71* 20  ALT 7 6 <5  ALKPHOS 102 68 71  BILITOT 1.3* 0.9 1.0  PROT 6.3* 6.2* 6.6  ALBUMIN 2.0* 2.1* 2.2*   No results for input(s): LIPASE, AMYLASE in the last 168 hours. No results for input(s): AMMONIA in the last 168 hours. CBC: Recent Labs  Lab 01/18/19 0424 01/19/19 0526  01/22/19 2305 01/23/19 0354 01/23/19 1206 01/23/19 1755 01/24/19 0606  WBC 20.9* 17.3*   < > 21.3* 19.9* 21.1* 21.2* 20.6*  NEUTROABS 15.4* 12.5*  --   --   --   --   --   --   HGB 10.4* 10.0*   < >  8.6* 8.6* 8.6* 9.0* 8.9*  HCT 30.4* 29.8*   < > 24.8* 25.4* 26.7* 26.8* 27.4*  MCV 68.0* 70.3*   < > 77.7* 77.9* 79.7* 80.0 80.8  PLT 153 224   < > 187 172 181 179 173   < > = values in this interval not displayed.   Cardiac Enzymes: No results for input(s): CKTOTAL, CKMB, CKMBINDEX, TROPONINI in the last 168 hours. CBG: Recent Labs  Lab 01/22/19 0038 01/22/19 0404 01/22/19 0832 01/22/19 1247 01/22/19 1520  GLUCAP 114* 112* 107* 99 100*    Iron Studies: No results for input(s): IRON, TIBC, TRANSFERRIN, FERRITIN in the last 72 hours. Studies/Results: US Renal  Result Date: 01/24/2019 CLINICAL DATA:  Acute renal injury EXAM:  RENAL / URINARY TRACT ULTRASOUND COMPLETE COMPARISON:  CT from 07/11/2008 FINDINGS: Right Kidney: Renal measurements: 12.7 x 6.1 x 5.7 cm. = volume: 236 mL. Lobulations are noted consistent with focal scarring seen on prior CT examination. No mass lesion or hydronephrosis is noted. Left Kidney: Renal measurements: 13.2 x 6.3 x 6.1 cm = volume: 272 mL. Echogenicity within normal limits. No mass or hydronephrosis visualized. Bladder: Appears normal for degree of bladder distention. IMPRESSION: Changes of scarring within the right kidney. No acute abnormality is noted. Electronically Signed   By: Alcide Clever M.D.   On: 01/24/2019 07:15    Medications: Infusions: . sodium chloride Stopped (01/23/19 1448)  . octreotide  (SANDOSTATIN)    IV infusion 50 mcg/hr (01/24/19 0134)  . piperacillin-tazobactam (ZOSYN)  IV 3.375 g (01/24/19 0609)  .  sodium bicarbonate  infusion 1000 mL 100 mL/hr at 01/24/19 0133    Scheduled Medications: . atorvastatin  80 mg Oral q1800  . pantoprazole  40 mg Intravenous Q12H  . sodium chloride flush  10-40 mL Intracatheter Q12H    have reviewed scheduled and prn medications.  Physical Exam: General:NAD, comfortable Heart:RRR, s1s2 nl, no rubs Lungs:clear b/l, no crackle Abdomen:soft, , non-distended Extremities:No edema Neurology: Alert awake and following commands  Dron Prasad Bhandari 01/24/2019,10:00 AM  LOS: 11 days

## 2019-01-25 DIAGNOSIS — D696 Thrombocytopenia, unspecified: Secondary | ICD-10-CM

## 2019-01-25 DIAGNOSIS — I6349 Cerebral infarction due to embolism of other cerebral artery: Secondary | ICD-10-CM

## 2019-01-25 LAB — BPAM RBC
BLOOD PRODUCT EXPIRATION DATE: 202003262359
BLOOD PRODUCT EXPIRATION DATE: 202003312359
Blood Product Expiration Date: 202003102359
Blood Product Expiration Date: 202003232359
Blood Product Expiration Date: 202003272359
Blood Product Expiration Date: 202003312359
Blood Product Expiration Date: 202003312359
Blood Product Expiration Date: 202003312359
ISSUE DATE / TIME: 202003011453
ISSUE DATE / TIME: 202003011749
ISSUE DATE / TIME: 202003020924
ISSUE DATE / TIME: 202003021039
ISSUE DATE / TIME: 202003021804
ISSUE DATE / TIME: 202003021851
ISSUE DATE / TIME: 202003021851
UNIT TYPE AND RH: 5100
Unit Type and Rh: 5100
Unit Type and Rh: 5100
Unit Type and Rh: 5100
Unit Type and Rh: 5100
Unit Type and Rh: 5100
Unit Type and Rh: 5100
Unit Type and Rh: 5100

## 2019-01-25 LAB — COMPREHENSIVE METABOLIC PANEL
ALT: 7 U/L (ref 0–44)
ANION GAP: 8 (ref 5–15)
AST: 22 U/L (ref 15–41)
Albumin: 2.1 g/dL — ABNORMAL LOW (ref 3.5–5.0)
Alkaline Phosphatase: 72 U/L (ref 38–126)
BUN: 44 mg/dL — ABNORMAL HIGH (ref 8–23)
CO2: 21 mmol/L — ABNORMAL LOW (ref 22–32)
Calcium: 8.1 mg/dL — ABNORMAL LOW (ref 8.9–10.3)
Chloride: 118 mmol/L — ABNORMAL HIGH (ref 98–111)
Creatinine, Ser: 1.5 mg/dL — ABNORMAL HIGH (ref 0.61–1.24)
GFR calc Af Amer: 55 mL/min — ABNORMAL LOW (ref >60)
GFR calc non Af Amer: 47 mL/min — ABNORMAL LOW (ref >60)
Glucose, Bld: 116 mg/dL — ABNORMAL HIGH (ref 70–99)
Potassium: 3.8 mmol/L (ref 3.5–5.1)
Sodium: 147 mmol/L — ABNORMAL HIGH (ref 135–145)
Total Bilirubin: 1.3 mg/dL — ABNORMAL HIGH (ref 0.3–1.2)
Total Protein: 6.4 g/dL — ABNORMAL LOW (ref 6.5–8.1)

## 2019-01-25 LAB — CBC WITH DIFFERENTIAL/PLATELET
Abs Immature Granulocytes: 0.3 10*3/uL — ABNORMAL HIGH (ref 0.00–0.07)
Basophils Absolute: 0 10*3/uL (ref 0.0–0.1)
Basophils Relative: 0 %
Eosinophils Absolute: 0 10*3/uL (ref 0.0–0.5)
Eosinophils Relative: 0 %
HCT: 27.2 % — ABNORMAL LOW (ref 39.0–52.0)
Hemoglobin: 8.8 g/dL — ABNORMAL LOW (ref 13.0–17.0)
Lymphocytes Relative: 5 %
Lymphs Abs: 0.8 10*3/uL (ref 0.7–4.0)
MCH: 26.8 pg (ref 26.0–34.0)
MCHC: 32.4 g/dL (ref 30.0–36.0)
MCV: 82.9 fL (ref 80.0–100.0)
Monocytes Absolute: 0.3 10*3/uL (ref 0.1–1.0)
Monocytes Relative: 2 %
Neutro Abs: 15.3 10*3/uL — ABNORMAL HIGH (ref 1.7–7.7)
Neutrophils Relative %: 91 %
Platelets: 145 10*3/uL — ABNORMAL LOW (ref 150–400)
Promyelocytes Relative: 2 %
RBC: 3.28 MIL/uL — ABNORMAL LOW (ref 4.22–5.81)
RDW: 22 % — ABNORMAL HIGH (ref 11.5–15.5)
WBC: 16.8 10*3/uL — ABNORMAL HIGH (ref 4.0–10.5)
nRBC: 0 /100 WBC
nRBC: 1.1 % — ABNORMAL HIGH (ref 0.0–0.2)

## 2019-01-25 LAB — TYPE AND SCREEN
ABO/RH(D): O POS
ANTIBODY SCREEN: NEGATIVE
UNIT DIVISION: 0
UNIT DIVISION: 0
Unit division: 0
Unit division: 0
Unit division: 0
Unit division: 0
Unit division: 0
Unit division: 0

## 2019-01-25 LAB — PHOSPHORUS: Phosphorus: 3.1 mg/dL (ref 2.5–4.6)

## 2019-01-25 LAB — MAGNESIUM: Magnesium: 2.6 mg/dL — ABNORMAL HIGH (ref 1.7–2.4)

## 2019-01-25 NOTE — Progress Notes (Signed)
IP rehab admissions - I am following for potential acute inpatient rehab admission.  Noted patient for EGD tomorrow.  Will follow up with patient in am.  Call me for questions.  [phone]

## 2019-01-25 NOTE — Progress Notes (Signed)
Stable.  No BM.  Hgb stable.  Renal fn improved, BUN decreasing.  Thinks difficulty swallowing water yesterday during SLP eval was due to ice-cold temperature--raising question of esoph spasm or possibly anastomotic stricturing (no evid of oropharyngeal dysfunction, per SLP).  VSS, Pt looks well, good spirits, NAD, abd obese but NT.  IMPR: 1. Quiescent UGIB 2. Stable post-hem anemia 3.  Regurgitation/dysphagia---???dysmotility  ?? obstrn  PLAN:  1. Cont sips and chips--would not try to advance diet even though pt feels hungry 2. EGD tomorrow--pt agreeable 3. D/c octreotide (ordered)  Florencia Reasons, M.D. Pager [phone] If no answer or after 5 PM call [phone]

## 2019-01-25 NOTE — Plan of Care (Signed)
  Problem: Education: Goal: Knowledge of patient specific risk factors addressed and post discharge goals established will improve Outcome: Progressing  Pt educated on the use of SCDs since he is not currently on any oral anticoagulation medications while in afib. Pt voiced understanding of SCDs. Pt encouraged to do hand and feet exercises while lying in bed to help improve his mobility and strength during this shift.  Problem: Education: Goal: Knowledge of patient specific risk factors addressed and post discharge goals established will improve Outcome: Progressing  Pt educated on POC for this shift and plan for EGD tomorrow at 0830, being NPO after midnight. Pt is also planning to meet with CIR coordinator for discharge placement pending EGD completion/results tomorrow.

## 2019-01-25 NOTE — Progress Notes (Addendum)
PROGRESS NOTE    Joel Rogers  DPT:[id number]  DOB: 1951-04-29  DOA: 01/13/2019 PCP: [name], MD  Brief Narrative:  68 y/o admitted to [facility] w/ sepsis and Streptococcal bacteremia due to R LE cellulitis. He was treated with IV antibiotics and improved. Due to his hx of bioprosthetic aortic valve, TEE was performed, and did not show any vegetations. Since admission he was noted to have persistent right-sided weakness. An MRI brain that showed bilateral CVAs and small subarachnoid hemorrhage.He was transferred to [facility] 2/29 for a Neurology evaluation.  Repeat CT head on February 29 showedprior infarcts and Stable small volume of subarachnoid hemorrhage within the left central sulcus.  On 3/1 he developed coffee-ground emesis w/ acute blood loss anemia. At Endoscopy 01/21/2019, and again on 3/2 he was found to have a significant amount of blood/blood clot in the stomach and distal esophagus, such that an intervention could not be performed.  He has remained off anticoagulation and off antiplatelet agents.  He has received total of 6 units PRBC since this admission.  Patient placed on octreotide drip while here and GI following. Subjective:  Patient had a bowel movement this afternoon which was melanotic but no hematochezia.  Hemoglobin remained stable and has not required blood transfusion in the last 3 days.  There has been some concern for dysphagia and patient only on clear liquid diet.  He is planned for repeat EGD in a.m.  He continues to endorse right-sided weakness greater than left.  Objective: Vitals:   01/25/19 1400 01/25/19 1500 01/25/19 1600 01/25/19 1700  BP: (!) 149/89 (!) 150/97 136/86   Pulse: 67 67 61 60  Resp: 20 (!) 21 (!) 21 15  Temp:      TempSrc:      SpO2: 100% 99% 100% 100%  Weight:      Height:        Intake/Output Summary (Last 24 hours) at 01/25/2019 1716 Last data filed at 01/25/2019 1600 Gross per 24 hour  Intake 787.62 ml  Output 2305 ml  Net -1517.38 ml    Filed Weights   01/19/19 2106 01/20/19 0423 01/22/19 0802  Weight: 123.3 kg 128.1 kg 128.1 kg    Physical Examination:  General exam: Appears calm and comfortable  Respiratory system: Clear to auscultation. Respiratory effort normal. Cardiovascular system: S1 & S2 heard, irregular.  No JVD, murmurs, rubs, gallops or clicks.  Chronic leg edema with chronic skin changes/hyperpigmentation in lower extremities.   Gastrointestinal system: Abdomen is nondistended, soft and nontender. No organomegaly or masses felt. Normal bowel sounds heard. Central nervous system: Right hemiparesis (strength 3/5 in right lower extremity and 4/5 in right upper extremity) with mild right facial droop.  Alert and oriented.  Extremities: Symmetric 5 x 5 power. Skin:  chronic skin changes/hyperpigmentation in lower extremities right greater than left.  No evidence of acute cellulitis.  Psychiatry: Judgement and insight appear normal. Mood & affect appropriate.     Data Reviewed: I have personally reviewed following labs and imaging studies  CBC: Recent Labs  Lab 01/19/19 0526  01/23/19 0354 01/23/19 1206 01/23/19 1755 01/24/19 0606 01/25/19 0418  WBC 17.3*   < > 19.9* 21.1* 21.2* 20.6* 16.8*  NEUTROABS 12.5*  --   --   --   --   --  15.3*  HGB 10.0*   < > 8.6* 8.6* 9.0* 8.9* 8.8*  HCT 29.8*   < > 25.4* 26.7* 26.8* 27.4* 27.2*  MCV 70.3*   < >  77.9* 79.7* 80.0 80.8 82.9  PLT 224   < > 172 181 179 173 145*   < > = values in this interval not displayed.   Basic Metabolic Panel: Recent Labs  Lab 01/21/19 0538 01/22/19 0217 01/23/19 0354 01/24/19 0606 01/25/19 0418  NA 136 139 142 145 147*  K 5.4* 5.5* 4.7 3.8 3.8  CL 113* 115* 118* 119* 118*  CO2 18* 15* 17* 20* 21*  GLUCOSE 154* 126* 119* 147* 116*  BUN 57* 71* 72* 56* 44*  CREATININE 1.77* 1.87* 1.95* 1.74* 1.50*  CALCIUM 7.8* 7.5* 8.0* 8.1* 8.1*  MG  --   --   --   --  2.6*  PHOS  --   --   --   --  3.1   GFR: Estimated Creatinine  Clearance: 66.1 mL/min (A) (by C-G formula based on SCr of 1.5 mg/dL (H)). Liver Function Tests: Recent Labs  Lab 01/19/19 0526 01/23/19 0354 01/24/19 0606 01/25/19 0418  AST 26 71* 20 22  ALT 7 6 <5 7  ALKPHOS 102 68 71 72  BILITOT 1.3* 0.9 1.0 1.3*  PROT 6.3* 6.2* 6.6 6.4*  ALBUMIN 2.0* 2.1* 2.2* 2.1*   No results for input(s): LIPASE, AMYLASE in the last 168 hours. No results for input(s): AMMONIA in the last 168 hours. Coagulation Profile: Recent Labs  Lab 01/24/19 0606  INR 1.3*   Cardiac Enzymes: No results for input(s): CKTOTAL, CKMB, CKMBINDEX, TROPONINI in the last 168 hours. BNP (last 3 results) No results for input(s): PROBNP in the last 8760 hours. HbA1C: No results for input(s): HGBA1C in the last 72 hours. CBG: Recent Labs  Lab 01/22/19 0404 01/22/19 0832 01/22/19 1247 01/22/19 1520 01/24/19 1928  GLUCAP 112* 107* 99 100* 116*   Lipid Profile: No results for input(s): CHOL, HDL, LDLCALC, TRIG, CHOLHDL, LDLDIRECT in the last 72 hours. Thyroid Function Tests: No results for input(s): TSH, T4TOTAL, FREET4, T3FREE, THYROIDAB in the last 72 hours. Anemia Panel: No results for input(s): VITAMINB12, FOLATE, FERRITIN, TIBC, IRON, RETICCTPCT in the last 72 hours. Sepsis Labs: No results for input(s): PROCALCITON, LATICACIDVEN in the last 168 hours.  Recent Results (from the past 240 hour(s))  MRSA PCR Screening     Status: None   Collection Time: 01/22/19  3:22 PM  Result Value Ref Range Status   MRSA by PCR NEGATIVE NEGATIVE Final    Comment:        The GeneXpert MRSA Assay (FDA approved for NASAL specimens only), is one component of a comprehensive MRSA colonization surveillance program. It is not intended to diagnose MRSA infection nor to guide or monitor treatment for MRSA infections. Performed at [facility], [address] [place], [name] [address]       Radiology Studies: US Renal  Result Date: 01/24/2019 CLINICAL DATA:  Acute  renal injury EXAM: RENAL / URINARY TRACT ULTRASOUND COMPLETE COMPARISON:  CT from 07/11/2008 FINDINGS: Right Kidney: Renal measurements: 12.7 x 6.1 x 5.7 cm. = volume: 236 mL. Lobulations are noted consistent with focal scarring seen on prior CT examination. No mass lesion or hydronephrosis is noted. Left Kidney: Renal measurements: 13.2 x 6.3 x 6.1 cm = volume: 272 mL. Echogenicity within normal limits. No mass or hydronephrosis visualized. Bladder: Appears normal for degree of bladder distention. IMPRESSION: Changes of scarring within the right kidney. No acute abnormality is noted. Electronically Signed   By: Alcide Clever M.D.   On: 01/24/2019 07:15  Scheduled Meds: . atorvastatin  80 mg Oral q1800  . pantoprazole  40 mg Intravenous Q12H  . sodium chloride flush  10-40 mL Intracatheter Q12H   Continuous Infusions: . sodium chloride Stopped (01/25/19 0556)  . piperacillin-tazobactam (ZOSYN)  IV 12.5 mL/hr at 01/25/19 1600    Assessment & Plan:    1.  Upper GI bleed: Status post EGD x2 with findings of retained blood/blood clot in the stomach with poor visualization.  Patient off octreotide drip today but remains on IV Protonix.  Appreciate GI evaluation.  Status post 6 units PRBC .  Hemoglobin stable around 8.8 since March 3.  Plan for repeat EGD in a.m.  He did have melena today, likely old blood.  Patient may have anastomotic ulcer.  He is status post [place]-Y Gastric Bypass  2.  Cellulitis of right lower extremity with severe sepsis/Streptococcus bacteremia: Present on admission to [facility].  TEE with no evidence of vegetations on the bioprosthetic aortic valve.  2 weeks of IV antibiotics planned and status post PICC line.  Currently on IV Zosyn day 5.  Last blood culture from February 22 grew 1 bottle pansensitive group C streptococcus. Still has leucocytosis ?reactive. Repeat blood cultures.  3.  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  Patient not a candidate for aspirin or Eliquis given ongoing GI issues.  Patient understands  risk of bleeding and risk of stroke with and without anticoagulation.  Patient seen by neurology during the initial hospital course.  PT/rehab evaluating  4.?  Aspiration pneumonia: Patient noted to be coughing while eating. CXR noted left lung base opacity, likely aspiration . Speech therapy evaluation requested.  Clear liquid diet currently.  Aspiration precautions.  Patient was transitioned to Zosyn on 3/1 to complete a 5-day course in order to cover aspiration organisms.  5.  AKI on chronic kidney disease stage III: Creatinine peaked to 1.9 during the hospital course and now appears to be down trending and closer to baseline (at 1.2).  6.  Thrombocytopenia: Appears to be stable around 80.  Off antiplatelet agents and anticoagulants at this time. Zosyn could contribute.  7.  Chronic atrial fibrillation: Patient previously on Eliquis which was held in concern for thrombocytopenia and transition to aspirin, now discontinued in view of significant GI bleed.  8. Small SAH :Noted on CT head 2/29 - may need repeat CT head before resuming anticoag or if there is change in mental status.   9.  Hypertension: Continue current medications.    DVT prophylaxis: SCD in concern for GI bleed.  Patient underwent Doppler right UE  in concern for swelling which was negative for DVT. Code Status: Full code Family / Patient Communication: Discussed with patient and explained in detail regarding stroke risk, bleeding risk and current care plan. Disposition Plan: Likely rehab once medically clear, tolerating diet.  Resume antiplatelets/anticoagulants if and when okay with GI.  Downgrade to telemetry.     LOS: 12 days    Time spent: 35 minutes    Alessandra Bevels, MD [facility] Pager [phone]  If 7PM-7AM, please contact night-coverage [email or web address] Password [facility] 01/25/2019, 5:16 PM

## 2019-01-25 NOTE — Anesthesia Preprocedure Evaluation (Addendum)
Anesthesia Evaluation  Patient identified by MRN, date of birth, ID band Patient awake    Reviewed: Allergy & Precautions, NPO status , Patient's Chart, lab work & pertinent test results  Airway Mallampati: II  TM Distance: >3 FB Neck ROM: Full    Dental no notable dental hx. (+) Dental Advisory Given, Poor Dentition,    Pulmonary sleep apnea , former smoker,    Pulmonary exam normal breath sounds clear to auscultation       Cardiovascular Exercise Tolerance: Good hypertension, Pt. on medications Normal cardiovascular exam+ Valvular Problems/Murmurs  Rhythm:Regular Rate:Normal  01/18/2019 Echo  1. The left ventricle has normal systolic function, with an ejection fraction of 60-65%. No evidence of left ventricular regional wall motion abnormalities.  2. No evidence of left ventricular regional wall motion abnormalities.  3. Left atrial size was severely dilated. No left atrial appendage thrombus noted.  4. Right atrial size was severely dilated. No right atrial appendage thrombus noted.   Neuro/Psych CVA, Residual Symptoms negative psych ROS   GI/Hepatic   Endo/Other    Renal/GU Renal diseaseCr 1.5 K 3.8     Musculoskeletal  (+) Arthritis ,   Abdominal (+) + obese,   Peds  Hematology  (+) anemia , Hgb 8.8   Anesthesia Other Findings   Reproductive/Obstetrics                          Anesthesia Physical Anesthesia Plan  ASA: IV  Anesthesia Plan: MAC   Post-op Pain Management:    Induction: Intravenous  PONV Risk Score and Plan: Treatment may vary due to age or medical condition  Airway Management Planned: Nasal Cannula and Natural Airway  Additional Equipment:   Intra-op Plan:   Post-operative Plan:   Informed Consent: I have reviewed the patients History and Physical, chart, labs and discussed the procedure including the risks, benefits and alternatives for the proposed  anesthesia with the patient or authorized representative who has indicated his/her understanding and acceptance.     Dental advisory given  Plan Discussed with: CRNA  Anesthesia Plan Comments: (EGD for GI Bleed)       Anesthesia Quick Evaluation

## 2019-01-25 NOTE — Consult Note (Signed)
Physical Medicine and Rehabilitation Consult Reason for Consult: Right side weakness Referring Physician: critical care   HPI: Joel Rogers is a 68 y.o.right handed male with history of diastolic congestive heart failure, aortic stenosis status post TAVR 07/2018, atrial fibrillation maintained on Eliquis, hypertension. Per chart review patient lives with spouse. Community ambulator. Active and driving. One level home 3 steps to entry.  Patient presented to [facility] hospital 01/13/2019 secondary to right and left upper extremity pain findings of sepsis due to Streptococcus bacteremia and cellulitis as well as recent fall. During admission maintain on antibiotic therapy patient with persistent right side weakness. MRI the brain revealed multiple acute ischemic infarctions in a pattern most consistent with cardioembolic stroke. Patient was transferred to [facility] for further evaluation. Venous Doppler study showed no signs of DVT. Findings of elevated liver function studies ultrasound the abdomen showed no focal lesion identified. MRI the brain with numerous scattered foci of acute ischemia throughout the cerebrum and cerebellum. Many of these lesions suggestive of embolic infarction. No midline shift or mass effect. No proximal intracranial arterial occlusion. Echocardiogram with ejection fraction of 60%. Normal systolic function. Patient remained on Eliquis for history of atrial fibrillation with the addition of aspirin for CVA. Patient developed coffee-ground emesis with acute blood loss anemia requiring transfusions 2 units pack red blood cells and gastroenterology services consulted EGD completed 2 with findings of red blood in the lower third of the esophagus clotted blood in the entire stomach. Blood thinners currently remained on hold patient is NPO and await plan for possible follow-up endoscopy. Therapy evaluations have been completed with recommendations of physical medicine  rehabilitation consult.   Review of Systems  Constitutional: Positive for fever. Negative for chills.  HENT: Negative for hearing loss.   Eyes: Negative for blurred vision and double vision.  Respiratory: Positive for shortness of breath. Negative for cough.   Cardiovascular: Positive for palpitations and leg swelling.  Gastrointestinal: Positive for abdominal pain, constipation and nausea.  Genitourinary: Negative for dysuria, flank pain, hematuria and urgency.  Musculoskeletal: Positive for falls, joint pain and myalgias.  Skin: Negative for rash.  Neurological: Positive for dizziness, weakness and headaches. Negative for speech change and seizures.  Psychiatric/Behavioral: The patient has insomnia.   All other systems reviewed and are negative.  Past Medical History:  Diagnosis Date  . Anemia    low iron  . Arthritis    bilateral knees  . Atrial fibrillation, chronic   . Chronic diastolic CHF (congestive heart failure) (HCC) 06/15/2018  . History of subdural hematoma   . Hyperlipidemia   . Hypertension   . Morbid obesity (HCC)   . Normal coronary arteries    by cardiac catheterization performed 03/14/06  . Pre-diabetes    pt denies  . S/P TAVR (transcatheter aortic valve replacement)    a. 07/25/18: Edwards Sapien 3 THV (size 26 mm, model # 9600TFX, serial # [id number]) by Dr. Laneta Simmers and Dr. Clifton James  . Sleep apnea    on CPAP  . Venous insufficiency    Past Surgical History:  Procedure Laterality Date  . CRANIOTOMY Right 08/27/2016   Procedure: CRANIOTOMY HEMATOMA EVACUATION SUBDURAL;  Surgeon: Maeola Harman, MD;  Location: [facility] OR;  Service: Neurosurgery;  Laterality: Right;  . ESOPHAGOGASTRODUODENOSCOPY N/A 01/21/2019   Procedure: ESOPHAGOGASTRODUODENOSCOPY (EGD);  Surgeon: Vida Rigger, MD;  Location: [facility] ENDOSCOPY;  Service: Endoscopy;  Laterality: N/A;  . ESOPHAGOGASTRODUODENOSCOPY (EGD) WITH PROPOFOL N/A 01/22/2019   Procedure: ESOPHAGOGASTRODUODENOSCOPY (EGD)  WITH PROPOFOL;   Surgeon: Bernette Redbird, MD;  Location: [facility] ENDOSCOPY;  Service: Endoscopy;  Laterality: N/A;  . GASTRIC BYPASS  09/2008  . JOINT REPLACEMENT  08/31/2006   right hip  . MULTIPLE EXTRACTIONS WITH ALVEOLOPLASTY N/A 07/13/2018   Procedure: Extraction of tooth #'s 3,8,10, 23-26, 30and 32 with alveoloplasty and gross debridement of remaining teeth;  Surgeon: Charlynne Pander, DDS;  Location: [facility] OR;  Service: Oral Surgery;  Laterality: N/A;  . RIGHT HEART CATH N/A 07/06/2018   Procedure: RIGHT HEART CATH;  Surgeon: Kathleene Hazel, MD;  Location: [facility] INVASIVE CV LAB;  Service: Cardiovascular;  Laterality: N/A;  . RIGHT/LEFT HEART CATH AND CORONARY ANGIOGRAPHY N/A 07/10/2018   Procedure: RIGHT/LEFT HEART CATH AND CORONARY ANGIOGRAPHY;  Surgeon: Dolores Patty, MD;  Location: [facility] INVASIVE CV LAB;  Service: Cardiovascular;  Laterality: N/A;  . TEE WITHOUT CARDIOVERSION N/A 07/25/2018   Procedure: TRANSESOPHAGEAL ECHOCARDIOGRAM (TEE);  Surgeon: Kathleene Hazel, MD;  Location: [facility] OR;  Service: Open Heart Surgery;  Laterality: N/A;  . TEE WITHOUT CARDIOVERSION N/A 01/18/2019   Procedure: TRANSESOPHAGEAL ECHOCARDIOGRAM (TEE) WITH PROPOFOL;  Surgeon: Jonelle Sidle, MD;  Location: [facility] ORS;  Service: Cardiovascular;  Laterality: N/A;  . TOTAL HIP ARTHROPLASTY     right hip  . TRANSCATHETER AORTIC VALVE REPLACEMENT, TRANSFEMORAL  07/25/2018  . TRANSCATHETER AORTIC VALVE REPLACEMENT, TRANSFEMORAL N/A 07/25/2018   Procedure: TRANSCATHETER AORTIC VALVE REPLACEMENT, TRANSFEMORAL;  Surgeon: Kathleene Hazel, MD;  Location: [facility] OR;  Service: Open Heart Surgery;  Laterality: N/A;   Family History  Problem Relation Age of Onset  . Hypertension Father   . Cancer Father        prob prostate  . Alzheimer's disease Mother   . Alzheimer's disease Maternal Grandfather   . Breast cancer Sister   . Cancer Brother        "brain tumor"   Social History:  reports that he has quit smoking. He has  never used smokeless tobacco. He reports that he does not drink alcohol or use drugs. Allergies: No Known Allergies Medications Prior to Admission  Medication Sig Dispense Refill  . acetaminophen (TYLENOL) 650 MG CR tablet Take 650 mg by mouth 2 (two) times daily as needed for pain.    Marland Kitchen amoxicillin (AMOXIL) 500 MG capsule Take 2,000 mg (4 tablets) one hour prior to dental visits. 8 capsule 3  . apixaban (ELIQUIS) 5 MG TABS tablet Take 1 tablet (5 mg total) by mouth 2 (two) times daily. 180 tablet 1  . aspirin 81 MG chewable tablet Chew 1 tablet (81 mg total) by mouth daily.    Marland Kitchen atorvastatin (LIPITOR) 80 MG tablet TAKE 1 TABLET BY MOUTH ONCE DAILY WITH  BREAKFAST 90 tablet 1  . colchicine 0.6 MG tablet TAKE 1 TABLET BY MOUTH TWICE DAILY 60 tablet 2  . diltiazem (CARDIZEM CD) 120 MG 24 hr capsule Take 1 capsule (120 mg total) by mouth daily. 90 capsule 1  . ergocalciferol (VITAMIN D2) 1.25 MG (50000 UT) capsule Take 50,000 Units by mouth once a week.    . spironolactone (ALDACTONE) 25 MG tablet Take 0.5 tablets (12.5 mg total) by mouth daily. 30 tablet 5  . torsemide (DEMADEX) 20 MG tablet Take 40 mg by mouth as needed. Use as needed if weight is over 245 pounds.      Home: Home Living Family/patient expects to be discharged to:: Private residence Living Arrangements: Spouse/significant other Available Help at Discharge: Family, Available 24 hours/day Type of  Home: House Home Access: Stairs to enter Secretary/administrator of Steps: 3 Entrance Stairs-Rails: Right Home Layout: One level Bathroom Shower/Tub: Tub/shower unit, Health visitor: Handicapped height Home Equipment: Environmental consultant - 2 wheels, Medical laboratory scientific officer - single point  Lives With: Spouse  Functional History: Prior Function Level of Independence: Independent Comments: community ambulator, drives, was actively going to [employer] and using the pool there Functional Status:  Mobility: Bed Mobility Overal bed mobility: Needs  Assistance Bed Mobility: Supine to Sit, Sit to Supine Supine to sit: Max assist, +2 for physical assistance, +2 for safety/equipment, HOB elevated Sit to supine: +2 for physical assistance, Max assist General bed mobility comments: increased time and effort, heavy assistance needed for R LE movement off of bed, heavy assistance with trunk management Transfers Overall transfer level: Needs assistance Equipment used: Rolling walker (2 wheeled) Transfer via Lift Equipment: [employer] Transfers: Sit to/from Stand, [employer] Transfers Sit to Stand: Mod assist, Max assist Stand pivot transfers: Mod assist, Max assist General transfer comment: mod to max to come to sitting once sitting can hold himself on EOB without external support. using LUE to hold onto rail. worked with balance and R UE +R LE exercises EOB Ambulation/Gait Ambulation/Gait assistance: Max Chemical engineer (Feet): 4 Feet Assistive device: Rolling walker (2 wheeled) Gait Pattern/deviations: Decreased step length - right, Decreased step length - left, Decreased stride length General Gait Details: limited to 7-8 slow labored side steps due to BLE weakness Gait velocity: slow    ADL: ADL Overall ADL's : Needs assistance/impaired Eating/Feeding: NPO Eating/Feeding Details (indicate cue type and reason): except ice chips, setup to place cup in hand Grooming: Min guard, Set up, Bed level, Wash/dry face, Sitting Grooming Details (indicate cue type and reason): pt with assist to apply lotion to back Upper Body Bathing: Minimal assistance, Sitting Lower Body Bathing: Maximal assistance, +2 for physical assistance, +2 for safety/equipment, Sitting/lateral leans Upper Body Dressing : Moderate assistance, Sitting Lower Body Dressing: +2 for physical assistance, +2 for safety/equipment, Sitting/lateral leans, Maximal assistance, Total assistance Lower Body Dressing Details (indicate cue type and reason): assist to don socks at bed  level Toileting- Clothing Manipulation and Hygiene: Total assistance, +2 for physical assistance, +2 for safety/equipment, Bed level Functional mobility during ADLs: Maximal assistance, Total assistance, +2 for physical assistance, +2 for safety/equipment General ADL Comments: pt able to sit EOB today and then use of maxisky for OOB to chair today, continues to have increased RLE pain (mostly in hip region), also with some limitations due to nausea  Cognition: Cognition Overall Cognitive Status: Impaired/Different from baseline Arousal/Alertness: Awake/alert Orientation Level: Oriented X4 Attention: Selective Selective Attention: Impaired Selective Attention Impairment: Verbal basic Memory: Impaired Memory Impairment: Storage deficit, Retrieval deficit, Decreased recall of new information Awareness: Impaired Awareness Impairment: Anticipatory impairment Cognition Arousal/Alertness: Awake/alert Behavior During Therapy: WFL for tasks assessed/performed Overall Cognitive Status: Impaired/Different from baseline General Comments: pt is very sweet, pleasant and motivated to work with therapy  Blood pressure (!) 147/78, pulse (!) 52, temperature 98 F (36.7 C), temperature source Oral, resp. rate 12, height  (1.854 m), weight 128.1 kg, SpO2 99 %. Physical Exam  Constitutional: He appears well-developed. No distress.  HENT:  Head: Normocephalic.  Eyes: Pupils are equal, round, and reactive to light.  Neck: Normal range of motion.  Cardiovascular: Normal rate.  Respiratory: Effort normal.  GI: Soft.  Musculoskeletal:        General: No edema.  Neurological:  Patient is alert. He makes good  eye contact with examiner. Follows full commands. He provides his name and age as well as date of birth. RUE and RLE 3+ to 4/5 with decreased FMC. Senses pain.   Skin: Skin is warm.  Psychiatric:  Delays in processing. Fair insight    Results for orders placed or performed during the  hospital encounter of 01/13/19 (from the past 24 hour(s))  Glucose, capillary     Status: Abnormal   Collection Time: 01/24/19  7:28 PM  Result Value Ref Range   Glucose-Capillary 116 (H) 70 - 99 mg/dL  Comprehensive metabolic panel     Status: Abnormal   Collection Time: 01/25/19  4:18 AM  Result Value Ref Range   Sodium 147 (H) 135 - 145 mmol/L   Potassium 3.8 3.5 - 5.1 mmol/L   Chloride 118 (H) 98 - 111 mmol/L   CO2 21 (L) 22 - 32 mmol/L   Glucose, Bld 116 (H) 70 - 99 mg/dL   BUN 44 (H) 8 - 23 mg/dL   Creatinine, Ser 8.45 (H) 0.61 - 1.24 mg/dL   Calcium 8.1 (L) 8.9 - 10.3 mg/dL   Total Protein 6.4 (L) 6.5 - 8.1 g/dL   Albumin 2.1 (L) 3.5 - 5.0 g/dL   AST 22 15 - 41 U/L   ALT 7 0 - 44 U/L   Alkaline Phosphatase 72 38 - 126 U/L   Total Bilirubin 1.3 (H) 0.3 - 1.2 mg/dL   GFR calc non Af Amer 47 (L) >60 mL/min   GFR calc Af Amer 55 (L) >60 mL/min   Anion gap 8 5 - 15  Magnesium     Status: Abnormal   Collection Time: 01/25/19  4:18 AM  Result Value Ref Range   Magnesium 2.6 (H) 1.7 - 2.4 mg/dL  CBC with Differential/Platelet     Status: Abnormal   Collection Time: 01/25/19  4:18 AM  Result Value Ref Range   WBC 16.8 (H) 4.0 - 10.5 K/uL   RBC 3.28 (L) 4.22 - 5.81 MIL/uL   Hemoglobin 8.8 (L) 13.0 - 17.0 g/dL   HCT 36.4 (L) 68.0 - 32.1 %   MCV 82.9 80.0 - 100.0 fL   MCH 26.8 26.0 - 34.0 pg   MCHC 32.4 30.0 - 36.0 g/dL   RDW 22.4 (H) 82.5 - 00.3 %   Platelets 145 (L) 150 - 400 K/uL   nRBC 1.1 (H) 0.0 - 0.2 %   Neutrophils Relative % 91 %   Neutro Abs 15.3 (H) 1.7 - 7.7 K/uL   Lymphocytes Relative 5 %   Lymphs Abs 0.8 0.7 - 4.0 K/uL   Monocytes Relative 2 %   Monocytes Absolute 0.3 0.1 - 1.0 K/uL   Eosinophils Relative 0 %   Eosinophils Absolute 0.0 0.0 - 0.5 K/uL   Basophils Relative 0 %   Basophils Absolute 0.0 0.0 - 0.1 K/uL   nRBC 0 0 /100 WBC   Promyelocytes Relative 2 %   Abs Immature Granulocytes 0.30 (H) 0.00 - 0.07 K/uL   Burr Cells PRESENT    Polychromasia  PRESENT    Target Cells PRESENT   Phosphorus     Status: None   Collection Time: 01/25/19  4:18 AM  Result Value Ref Range   Phosphorus 3.1 2.5 - 4.6 mg/dL   US Renal  Result Date: 01/24/2019 CLINICAL DATA:  Acute renal injury EXAM: RENAL / URINARY TRACT ULTRASOUND COMPLETE COMPARISON:  CT from 07/11/2008 FINDINGS: Right Kidney: Renal measurements: 12.7 x 6.1 x 5.7 cm. =  volume: 236 mL. Lobulations are noted consistent with focal scarring seen on prior CT examination. No mass lesion or hydronephrosis is noted. Left Kidney: Renal measurements: 13.2 x 6.3 x 6.1 cm = volume: 272 mL. Echogenicity within normal limits. No mass or hydronephrosis visualized. Bladder: Appears normal for degree of bladder distention. IMPRESSION: Changes of scarring within the right kidney. No acute abnormality is noted. Electronically Signed   By: Alcide Clever M.D.   On: 01/24/2019 07:15     Assessment/Plan: Diagnosis: 68 yo male admitted with sepsis who suffered subsequent cardio-embolic bi-cerebral infarcts 1. Does the need for close, 24 hr/day medical supervision in concert with the patient's rehab needs make it unreasonable for this patient to be served in a less intensive setting? Yes 2. Co-Morbidities requiring supervision/potential complications: sepsis, ID considerations, UGIB, atrial fib 3. Due to bladder management, bowel management, safety, skin/wound care, disease management and medication administration, does the patient require 24 hr/day rehab nursing? Yes 4. Does the patient require coordinated care of a physician, rehab nurse, PT (1-2 hrs/day, 5 days/week), OT (1-2 hrs/day, 5 days/week) and SLP (1-2 hrs/day, 5 days/week) to address physical and functional deficits in the context of the above medical diagnosis(es)? Yes Addressing deficits in the following areas: balance, endurance, locomotion, strength, transferring, bowel/bladder control, bathing, dressing, feeding, grooming, toileting, cognition, speech and  psychosocial support 5. Can the patient actively participate in an intensive therapy program of at least 3 hrs of therapy per day at least 5 days per week? Yes 6. The potential for patient to make measurable gains while on inpatient rehab is excellent 7. Anticipated functional outcomes upon discharge from inpatient rehab are supervision and min assist  with PT, supervision and min assist with OT, modified independent and supervision with SLP. 8. Estimated rehab length of stay to reach the above functional goals is: 12-16 days 9. Anticipated D/C setting: Home 10. Anticipated post D/C treatments: HH therapy 11. Overall Rehab/Functional Prognosis: excellent  RECOMMENDATIONS: This patient's condition is appropriate for continued rehabilitative care in the following setting: CIR Patient has agreed to participate in recommended program. Yes Note that insurance prior authorization may be required for reimbursement for recommended care.  Comment: Rehab Admissions Coordinator to follow up.  Thanks,  Ranelle Oyster, MD, [place]  I have personally performed a face to face diagnostic evaluation of this patient. Additionally, I have examined pertinent labs and radiographic images. I have reviewed and concur with the physician assistant's documentation above.    Mcarthur Rossetti Angiulli, PA-C 01/25/2019

## 2019-01-25 NOTE — Progress Notes (Signed)
  Speech Language Pathology Treatment: Cognitive-Linquistic  Patient Details Name: Joel Rogers MRN: [id number] DOB: 1950/12/23 Today's Date: 01/25/2019 Time: 3893-7342 SLP Time Calculation (min) (ACUTE ONLY): 10 min  Assessment / Plan / Recommendation Clinical Impression  Pt demonstrates improved orientation to situation, is able to provide more specifics about condition than he was at time of initial evaluation.  Demonstrates improved word-finding, topic maintenance, and sustained attention during structured conversational tasks/propositional speech.  Recalls events of day, visitors independently. Pt will benefit from higher-level cognitive assessment in this venue or CIR.     HPI HPI: Pt is a 68 y/o male admitted 01/13/19 (originally to [facility]) secondary to R UE and R LE pain with sepsis due to streptococcus bacteremia and cellulitis. During admission, pt with persistent R sided weakness. An MRI of the brain revealed multiple acute ischemic infarcts in a pattern that is most consistent with cardioembolic strokes and pt was transferred to [facility] for further care.  Pt's stay further complicated by hematemesis who underwent upper GI endoscopy on 01/21/19 and 2 units of PRBCs due to ABLA.  PMH including but not limited to atrial fibrillation on anticoagulation, CHF, history of subdural hematoma, HLD, HTN, TAVR placement in 9/19 and sleep apnea. Bedside swallow evaluation ordered secondary to pt frequently bringing up secretions, concerns for aspiration pna, and recent CVA.       SLP Plan  Continue with current plan of care       Recommendations   CIR                Follow up Recommendations: Other (comment)(tba) SLP Visit Diagnosis: Cognitive communication deficit (A76.811) Plan: Continue with current plan of care       GO              Nikhil Osei L. Samson Frederic, MA CCC/SLP Acute Rehabilitation Services Office number [phone] Pager [phone]   Blenda Mounts  Laurice 01/25/2019, 4:12 PM

## 2019-01-26 ENCOUNTER — Inpatient Hospital Stay (HOSPITAL_COMMUNITY): Payer: Medicare Other | Admitting: Anesthesiology

## 2019-01-26 ENCOUNTER — Encounter (HOSPITAL_COMMUNITY): Payer: Self-pay | Admitting: *Deleted

## 2019-01-26 ENCOUNTER — Encounter (HOSPITAL_COMMUNITY)
Admission: EM | Disposition: A | Discharge status: Rehabilitation Facility | Payer: Self-pay | Source: Home / Self Care | Attending: Internal Medicine

## 2019-01-26 DIAGNOSIS — R7881 Bacteremia: Secondary | ICD-10-CM

## 2019-01-26 DIAGNOSIS — B955 Unspecified streptococcus as the cause of diseases classified elsewhere: Secondary | ICD-10-CM | POA: Diagnosis present

## 2019-01-26 DIAGNOSIS — L0390 Cellulitis, unspecified: Secondary | ICD-10-CM | POA: Diagnosis present

## 2019-01-26 DIAGNOSIS — L03115 Cellulitis of right lower limb: Secondary | ICD-10-CM

## 2019-01-26 DIAGNOSIS — K922 Gastrointestinal hemorrhage, unspecified: Secondary | ICD-10-CM | POA: Clinically undetermined

## 2019-01-26 HISTORY — PX: ESOPHAGOGASTRODUODENOSCOPY (EGD) WITH PROPOFOL: SHX5813

## 2019-01-26 HISTORY — PX: BIOPSY: SHX5522

## 2019-01-26 LAB — CBC
HCT: 27.6 % — ABNORMAL LOW (ref 39.0–52.0)
Hemoglobin: 8.6 g/dL — ABNORMAL LOW (ref 13.0–17.0)
MCH: 26.4 pg (ref 26.0–34.0)
MCHC: 31.2 g/dL (ref 30.0–36.0)
MCV: 84.7 fL (ref 80.0–100.0)
Platelets: 148 10*3/uL — ABNORMAL LOW (ref 150–400)
RBC: 3.26 MIL/uL — ABNORMAL LOW (ref 4.22–5.81)
RDW: 22.5 % — ABNORMAL HIGH (ref 11.5–15.5)
WBC: 16.8 10*3/uL — ABNORMAL HIGH (ref 4.0–10.5)
nRBC: 0.7 % — ABNORMAL HIGH (ref 0.0–0.2)

## 2019-01-26 LAB — COMPREHENSIVE METABOLIC PANEL
ALT: 6 U/L (ref 0–44)
ANION GAP: 10 (ref 5–15)
AST: 21 U/L (ref 15–41)
Albumin: 2 g/dL — ABNORMAL LOW (ref 3.5–5.0)
Alkaline Phosphatase: 69 U/L (ref 38–126)
BUN: 37 mg/dL — ABNORMAL HIGH (ref 8–23)
CALCIUM: 7.7 mg/dL — AB (ref 8.9–10.3)
CO2: 18 mmol/L — ABNORMAL LOW (ref 22–32)
Chloride: 117 mmol/L — ABNORMAL HIGH (ref 98–111)
Creatinine, Ser: 1.35 mg/dL — ABNORMAL HIGH (ref 0.61–1.24)
GFR calc Af Amer: >60 mL/min (ref >60)
GFR calc non Af Amer: 54 mL/min — ABNORMAL LOW (ref >60)
Glucose, Bld: 108 mg/dL — ABNORMAL HIGH (ref 70–99)
Potassium: 3.6 mmol/L (ref 3.5–5.1)
Sodium: 145 mmol/L (ref 135–145)
Total Bilirubin: 1.5 mg/dL — ABNORMAL HIGH (ref 0.3–1.2)
Total Protein: 6.8 g/dL (ref 6.5–8.1)

## 2019-01-26 LAB — GLUCOSE, CAPILLARY
Glucose-Capillary: 101 mg/dL — ABNORMAL HIGH (ref 70–99)
Glucose-Capillary: 108 mg/dL — ABNORMAL HIGH (ref 70–99)

## 2019-01-26 SURGERY — ESOPHAGOGASTRODUODENOSCOPY (EGD) WITH PROPOFOL
Anesthesia: Monitor Anesthesia Care

## 2019-01-26 MED ORDER — SODIUM CHLORIDE 0.9 % IV SOLN
INTRAVENOUS | Status: DC
  Administered 2019-01-26: 08:00:00 via INTRAVENOUS

## 2019-01-26 MED ORDER — BUTAMBEN-TETRACAINE-BENZOCAINE 2-2-14 % EX AERO
INHALATION_SPRAY | CUTANEOUS | Status: DC | PRN
  Administered 2019-01-26: 2 via TOPICAL

## 2019-01-26 MED ORDER — PROPOFOL 500 MG/50ML IV EMUL
INTRAVENOUS | Status: DC | PRN
  Administered 2019-01-26: 100 ug/kg/min via INTRAVENOUS

## 2019-01-26 SURGICAL SUPPLY — 15 items

## 2019-01-26 NOTE — Progress Notes (Signed)
OT Cancellation Note  Patient Details Name: Joel Rogers MRN: [id number] DOB: [birth year]   Cancelled Treatment:    Reason Eval/Treat Not Completed: Patient at procedure or test/ unavailable (EGD); will follow up for OT treatment as schedule permits.  Marcy Siren, OT Supplemental Rehabilitation Services Pager [phone] Office [phone]   Orlando Penner 01/26/2019, 7:42 AM

## 2019-01-26 NOTE — Anesthesia Procedure Notes (Signed)
Procedure Name: MAC Date/Time: 01/26/2019 8:48 AM Performed by: Jenne Campus, CRNA Pre-anesthesia Checklist: Patient identified, Emergency Drugs available, Suction available and Patient being monitored Oxygen Delivery Method: Nasal cannula

## 2019-01-26 NOTE — Transfer of Care (Signed)
Immediate Anesthesia Transfer of Care Note  Patient: LEELYNN OTERO  Procedure(s) Performed: ESOPHAGOGASTRODUODENOSCOPY (EGD) WITH PROPOFOL (N/A ) BIOPSY  Patient Location: PACU  Anesthesia Type:General  Level of Consciousness: sedated and patient cooperative  Airway & Oxygen Therapy: Patient Spontanous Breathing and Patient connected to nasal cannula oxygen  Post-op Assessment: Report given to RN and Post -op Vital signs reviewed and stable  Post vital signs: Reviewed  Last Vitals:  Vitals Value Taken Time  BP 118/55 01/26/2019  9:16 AM  Temp 37.1 C 01/26/2019  9:16 AM  Pulse 60 01/26/2019  9:20 AM  Resp 13 01/26/2019  9:20 AM  SpO2 100 % 01/26/2019  9:20 AM  Vitals shown include unvalidated device data.  Last Pain:  Vitals:   01/26/19 0916  TempSrc: Oral  PainSc: 0-No pain      Patients Stated Pain Goal: 7 (01/18/19 6073)  Complications: No apparent anesthesia complications

## 2019-01-26 NOTE — Progress Notes (Signed)
After review of chart, pt has not required any additional blood products in the last few days. GI planning another EGD today. No surgical intervention indicated at this time. We will sign off. Please page [place] if any surgical needs arise.  Mattie Marlin, [facility] Surgery Pager [phone]

## 2019-01-26 NOTE — Progress Notes (Signed)
IP rehab admissions - patient sleeping on my rounds this afternoon.  I called and left a message with his wife.  I will open the case and request acute inpatient rehab admission.  I will follow up on Monday after I hear back from insurance case manager.  Call me for questions.  [phone]

## 2019-01-26 NOTE — Progress Notes (Signed)
Patient's endoscopy was well-tolerated.  It shows what appears to be resolving ischemic changes in the region of the gastric pouch and the anastomosis, characterized by necrotic, sloughing tissue as well as some mucosal erythema and erosions.  No active bleeding or blood in the GI tract at the time of this exam.  Recommendations:  Will start clear liquid diet. Would hold off on resumption of anticoagulation for at least several days, and perhaps a week, while the mucosa undergoes restitution.  Findings discussed with Dr. Janee Morn.  We will continue to follow with you.  Florencia Reasons, M.D. Pager [phone] If no answer or after 5 PM call [phone]

## 2019-01-26 NOTE — Op Note (Signed)
[facility] Patient Name: Joel Rogers Procedure Date : 01/26/2019 MRN: [id number] Attending MD: Bernette Redbird , MD Date of Birth: Apr 24, 1951 CSN: [id number] Age: 68 Admit Type: Inpatient Procedure:                Upper GI endoscopy Indications:              Recent gastrointestinal bleeding--patient with                            melena, severe drop in hgb while on Eliquis, some                            upper abd pain. EGD x 2 earlier this week                            unsuccessful due to large amount of blood                            precluding visualization. Providers:                Bernette Redbird, MD, Margaree Mackintosh, RN,                            Arlee Muslim Tech., Technician, Romie Minus,                            CRNA Referring MD:              Medicines:                Monitored Anesthesia Care Complications:            No immediate complications. Estimated Blood Loss:     Estimated blood loss was minimal. Procedure:                Pre-Anesthesia Assessment:                           - Prior to the procedure, a History and Physical                            was performed, and patient medications and                            allergies were reviewed. The patient's tolerance of                            previous anesthesia was also reviewed. The risks                            and benefits of the procedure and the sedation                            options and risks were discussed with the patient.                            All questions  were answered, and informed consent                            was obtained. Prior Anticoagulants: The patient has                            taken Eliquis (apixaban), last dose was 6 days                            prior to procedure. ASA Grade Assessment: IV - A                            patient with severe systemic disease that is a                            constant threat to life. After reviewing  the risks                            and benefits, the patient was deemed in                            satisfactory condition to undergo the procedure.                           After obtaining informed consent, the endoscope was                            passed under direct vision. Throughout the                            procedure, the patient's blood pressure, pulse, and                            oxygen saturations were monitored continuously. The                            GIF-H190 ([id number] Olympus gastroscope was                            introduced through the mouth, and advanced to the                            efferent jejunal loop. The upper GI endoscopy was                            accomplished without difficulty. The patient                            tolerated the procedure well. Scope In: Scope Out: Findings:      No active bleeding or blood present.      The examined esophagus was normal. Specifically, no esophagitis,       varices, or Mallory-Weiss tear.      Evidence of a gastric bypass was found with a medium-sized  gastric       pouch. The distal portion of the pouch and the gastrojejunal anastomosis       was characterized by extensive mucosal necrosis and sloughing as well as       some erythema and erosions. This was traversed. The efferent limb beyond       the anastomotic area appeared healthy. Biopsies were taken with a cold       forceps for histology. Estimated blood loss was minimal. Impression:               - No active bleeding or blood present in the upper                            GI tract at time of this procedure.                           - Normal esophagus.                           - Gastric bypass with a medium-sized pouch.                            Gastrojejunal anastomosis characterized by                            extensive mucosal necrosis and sloughing as well as                            some erosive changes. Biopsied. This is  presumably                            the source of the recent bleeding.                           - Normal examined jejunum. Recommendation:           - Await pathology results.                           - Continue present medications.                           - Clear liquid diet.                           - Would hold off on anticoagulation for at least                            several more days. Procedure Code(s):        --- Professional ---                           [phone], Esophagogastroduodenoscopy, flexible,                            transoral; with biopsy, single or multiple Diagnosis Code(s):        --- Professional ---  K92.2, Gastrointestinal hemorrhage, unspecified CPT copyright 2018 [employer]. All rights reserved. The codes documented in this report are preliminary and upon coder review may  be revised to meet current compliance requirements. Bernette Redbird, MD 01/26/2019 9:23:10 AM This report has been signed electronically. Number of Addenda: 0

## 2019-01-26 NOTE — Progress Notes (Signed)
Occupational Therapy Treatment Patient Details Name: Joel Rogers MRN: [id number] DOB: [birth year] Today's Date: 01/26/2019    History of present illness Pt is a 68 y/o male admitted 01/13/19 (originally to [facility]) secondary to R UE and R LE pain with sepsis due to streptococcus bacteremia and cellulitis. During admission, pt with persistent R sided weakness. An MRI of the brain revealed multiple acute ischemic infarcts in a pattern that is most consistent with cardioembolic strokes and pt was transferred to [facility] for further care.  Pt's stay further complicated by hematemesis who underwent upper GI endoscopy on 01/21/19 and 2 units of PRBCs due to ABLA.  PMH including but not limited to atrial fibrillation on anticoagulation, CHF, history of subdural hematoma, HLD, HTN, TAVR placement in 9/19 and sleep apnea.   OT comments  Pt presents supine in bed, reports increased fatigue after procedure earlier this AM and with transfer to new floor, though given min encouragement pt agreeable to working with OT. Pt engaging in bil UE/LE A/AA/PROM with increased focus on RUE ROM. Pt with improvements noted in ability to actively engage RUE, educated in self-ROM as well as edema reduction techniques as pt with edemous RUE. Pt remains motivated to return to PLOF. Feel POC remains appropriate at this time. Will continue to follow acutely.    Follow Up Recommendations  CIR;Supervision/Assistance - 24 hour    Equipment Recommendations  Other (comment)(defer to next venue)          Precautions / Restrictions Precautions Precautions: Fall Precaution Comments: R sided weakness and R hip/thigh pain Restrictions Weight Bearing Restrictions: No       Mobility Bed Mobility                  Transfers                      Balance                                           ADL either performed or assessed with clinical judgement   ADL Overall ADL's :  Needs assistance/impaired Eating/Feeding: Set up;Bed level Eating/Feeding Details (indicate cue type and reason): pt reports having increased appetite today, reports no nausea                                   General ADL Comments: pt reports increased fatigue today having had EGD earlier and transferring to new floors, agreeable to bed level therapy session     Vision       Perception     Praxis      Cognition Arousal/Alertness: Awake/alert Behavior During Therapy: WFL for tasks assessed/performed Overall Cognitive Status: Impaired/Different from baseline Area of Impairment: Problem solving;Following commands                       Following Commands: Follows one step commands with increased time     Problem Solving: Slow processing          Exercises Exercises: General Upper Extremity;General Lower Extremity;Other exercises General Exercises - Upper Extremity Shoulder Flexion: AROM;AAROM;Both;10 reps(light resistance provided for LUE) Shoulder ABduction: AAROM;Right;10 reps Shoulder ADduction: AAROM;Right;10 reps Shoulder Horizontal ABduction: AAROM;Right;10 reps Shoulder Horizontal ADduction: AAROM;Right;10 reps Elbow Flexion: Self ROM;AAROM;AROM;Both;10 reps Elbow Extension:  AROM;AAROM;Self ROM;10 reps Wrist Flexion: PROM;Right;10 reps Wrist Extension: PROM;10 reps;Right Digit Composite Flexion: AROM;Right;10 reps Composite Extension: AROM;10 reps;Right General Exercises - Lower Extremity Ankle Circles/Pumps: AROM;10 reps;Both Other Exercises Other Exercises: LE PROM knee flex/extension Other Exercises: worked on having pt internally rotate bil LEs to neutral position as pt's LEs tend to rest in external rotation, completed x10 at bed level Other Exercises: educated pt in self-ROM to RUE    Shoulder Instructions       General Comments      Pertinent Vitals/ Pain       Pain Assessment: Faces Faces Pain Scale: Hurts little more Pain  Location: R LE with ROM Pain Descriptors / Indicators: Guarding;Sore Pain Intervention(s): Monitored during session;Repositioned  Home Living                                          Prior Functioning/Environment              Frequency  Min 2X/week        Progress Toward Goals  OT Goals(current goals can now be found in the care plan section)  Progress towards OT goals: Progressing toward goals  Acute Rehab OT Goals Patient Stated Goal: return home after rehab OT Goal Formulation: With patient Time For Goal Achievement: 02/03/19 Potential to Achieve Goals: Good ADL Goals Pt Will Perform Grooming: sitting;with min guard assist Pt Will Perform Upper Body Bathing: with min guard assist;sitting Pt Will Perform Upper Body Dressing: with min guard assist;sitting Pt/caregiver will Perform Home Exercise Program: Increased strength;Increased ROM;Right Upper extremity;With minimal assist Additional ADL Goal #1: Pt will perform bed mobility with modA as precursor to EOB/OOB ADL. Additional ADL Goal #2: Pt will maintain sitting balance EOB with minguard assist during ADL/functional task.  Plan Discharge plan remains appropriate    Co-evaluation                 AM-PAC OT "6 Clicks" Daily Activity     Outcome Measure   Help from another person eating meals?: A Little Help from another person taking care of personal grooming?: A Little Help from another person toileting, which includes using toliet, bedpan, or urinal?: Total Help from another person bathing (including washing, rinsing, drying)?: A Lot Help from another person to put on and taking off regular upper body clothing?: A Lot Help from another person to put on and taking off regular lower body clothing?: Total 6 Click Score: 12    End of Session    OT Visit Diagnosis: Muscle weakness (generalized) (M62.81);Other abnormalities of gait and mobility (R26.89);Pain Pain - Right/Left: Right Pain  - part of body: Hip   Activity Tolerance Patient tolerated treatment well   Patient Left in bed;with call bell/phone within reach;with family/visitor present   Nurse Communication Mobility status        Time: 4097-3532 OT Time Calculation (min): 29 min  Charges: OT General Charges $OT Visit: 1 Visit OT Treatments $Therapeutic Activity: 23-37 mins  Marcy Siren, OT Supplemental Rehabilitation Services Pager [phone] Office [phone]    Joel Rogers 01/26/2019, 3:40 PM

## 2019-01-26 NOTE — Interval H&P Note (Signed)
History and Physical Interval Note:  01/26/2019 8:26 AM  Joel Rogers  has presented today for surgery, with the diagnosis of gi bleeding  The various methods of treatment have been discussed with the patient. After consideration of risks, benefits and other options for treatment, the patient has consented to  Procedure(s): ESOPHAGOGASTRODUODENOSCOPY (EGD) WITH PROPOFOL (N/A) as a surgical intervention .  The patient's history has been reviewed, patient examined, no change in status, stable for surgery.  I have reviewed the patient's chart and labs.  Questions were answered to the patient's satisfaction.     Katy Fitch [name]

## 2019-01-26 NOTE — Progress Notes (Signed)
PROGRESS NOTE    Joel LeachRalph L Mihalko  ZOX:[id number] DOB: 11-28-1950 DOA: 01/13/2019 PCP: Salley Scarleturham, Kawanta F, MD    Brief Narrative:  68 y/o admitted to [facility] w/ sepsis and Streptococcal bacteremia due to R LE cellulitis. He was treated with IV antibiotics and improved. Due to his hx of bioprosthetic aortic valve, TEE was performed, and did not show any vegetations. Since admission he was noted to have persistent right-sided weakness. An MRI brain that showed bilateral CVAs and small subarachnoid hemorrhage.He was [facility] for a Neurology evaluation.  Repeat CT head on February 29 showedprior infarcts and Stable small volume of subarachnoid hemorrhage within the left central sulcus.  On 3/1 he developed coffee-ground emesis w/ acute blood loss anemia. At Endoscopy 01/21/2019, and again on 3/2 he was found to have a significant amount of blood/blood clot in the stomach and distal esophagus, such that an intervention could not be performed.  He has remained off anticoagulation and off antiplatelet agents.  He has received total of 6 units PRBC since this admission.  Patient placed on octreotide drip while here and GI following.  Octreotide drip has been discontinued.  Patient underwent upper endoscopy today 01/26/2019 with findings concerning for resolving ischemic changes in the region of the gastric pouch and anastomosis.,  No active bleeding noted.   Assessment & Plan:   Principal Problem:   UGIB (upper gastrointestinal bleed) Active Problems:   Acute CVA (cerebrovascular accident) (HCC)   Bacteremia due to Streptococcus   Obstructive sleep apnea   Chronic atrial fibrillation   HTN (hypertension)   Hyperlipemia   Status post gastric bypass for obesity   Vitamin D deficiency   History of Severe aortic stenosis   Varicose veins of right lower extremity with complications   Pulmonary hypertension, unspecified (HCC)   History of subdural hematoma   S/P TAVR (transcatheter aortic valve  replacement)   Sepsis (HCC)   Acute renal failure (HCC)   Right shoulder pain   Cellulitis  1 upper GI bleed Patient status post EGD x3.  Likely secondary to ischemic changes in the region of the gastric pouch and anastomosis which was characterized by necrotic, sloughing tissue as well as some mucosal erythema and erosions noted on upper endoscopy of 01/26/2019.  Per GI upper endoscopy done 01/26/2019 with no active bleeding noted or blood in the GI tract.  Patient was on octreotide drip which has subsequently been discontinued.  Patient status post 6 units packed red blood cells during this hospitalization.  Hemoglobin currently stable at 8.6.  Patient started on clear liquids per GI.  Continue Protonix 40 mg IV every 12 hours.  Follow H&H.  Appreciate GI input and recommendations.  2.  Right lower extremity cellulitis with severe sepsis/Streptococcus bacteremia Present on admission at any pain.  Patient underwent a TEE which was negative for vegetations on bioprosthetic aortic valve.  PICC line has been placed.  2 weeks of IV antibiotics have been planned patient currently on IV Zosyn day #5/6.  Last blood culture from February 22 grew 1 bottle of pansensitive group C streptococcus.  Patient currently afebrile.  Repeat blood cultures from 01/25/2019 with no growth to date.  Patient with a leukocytosis.  WBC trending down.  Continue IV antibiotics.  Follow for now.  3.  Acute embolic CVA Patient with some residual right-sided weakness and dysarthria as well as some probable dysphagia.  MRI MRA head with numerous scattered foci of acute ischemia throughout the cerebrum and cerebellum suggestive  of embolic infarcts.  Others noted in the deep watershed distribution as seen in the setting of hypoperfusion events.  Also noted was concern for subarachnoid blood.  Doppler ultrasounds negative for DVT.  2D echo/TEE negative for any vegetations and no source of emboli noted.  Patient with carotid Dopplers on  07/10/2018 and as such those were not repeated.  Patient currently not a candidate for aspirin or Eliquis given ongoing upper GI bleed/issues.  Patient understands the risk of bleeding the risk of stroke with and without anticoagulation.  Patient has been seen in consultation by neurology initially during the hospitalization.  Continue Lipitor.  Patient being followed by GI who recommend holding anticoagulation for several days to at least 1 week.  PT/OT.  Likely needs inpatient rehab.  Follow.  4. ?? aspiration pneumonia Patient noted to have coughing episodes while eating.  Chest x-ray with left lung base opacity likely concern for aspiration.  Speech therapy evaluation pending.  Patient currently on clear liquids.  Continue aspiration precautions.  Patient is transition to Zosyn on January 21, 2019 to complete a 5-day course to cover for aspiration.  Follow.  5.  Acute blood loss anemia Secondary to problem #1.  Status post 6 units packed red blood cells.  H&H stable at 8.6.  Follow.  6.  Acute kidney injury on chronic kidney disease stage III [place] secondary to ischemic ATN in the setting of sepsis, hemodynamic compromise due to severe anemia and GI bleed.  Renal ultrasound with no acute abnormalities noted.   Creatinine peaked at 1.9 during the hospital course seems to be trending down.  Creatinine currently at 1.35.  Nephrology following.  Follow.  7.  Metabolic acidosis Likely secondary to problem #6.  Bicarb was discontinued per nephrology.  Bicarbonate 18.  Follow for now.  If continued decreasing bicarb will place on bicarb tablets.  Nephrology following.  8.  Thrombocytopenia Currently off antiplatelet agents.  Platelet count improving and currently at 148.  Follow.  9.  Chronic atrial fibrillation Currently rate controlled.  Patient was previously on Eliquis which was held secondary to thrombocytopenia as well as acute GI bleed now.  Per GI continue to hold anticoagulation for several days  up to about a week.  Follow.  10.  Small subarachnoid hemorrhage Noted on CT head 01/20/2019.  May repeat head CT if mental status worsens and also prior to initiation of anticoagulation.  11.  Hypertension BP stable.  Follow for now.  12.  Obstructive sleep apnea CPAP nightly.   DVT prophylaxis: SCDs due to recent GI bleed. Code Status: Full Family Communication: Updated patient and family at bedside. Disposition Plan: Likely CIR when medically stable.   Consultants:   Inpatient rehab: Dr.Swartz 01/25/2019  Nephrology: Dr. Ronalee Belts 01/23/2019  PCCM Dr. Molli Knock 01/22/2019  Interventional radiology: Dr. Fredia Sorrow 01/22/2019  General surgery: Dr.Tsuei 01/22/2019  Gastroenterology: Dr. Ewing Schlein 01/21/2019  Neurology: Dr. Otelia Limes 01/20/2019  Procedures:   CT head 01/20/2019  Chest x-ray 01/13/2019, 01/15/2019, 01/21/2019  MRI MRA head 01/19/2019  MRI C-spine 01/19/2019  Renal ultrasound 01/24/2019  Right lower extremity Dopplers 01/14/2019- negative for DVT  Right upper extremity Dopplers 01/18/2019--- negative for DVT  Plain films of the right hip and pelvis 01/16/2019  Right upper quadrant ultrasound 01/18/2019  TEE 01/18/2019-  2D echo 01/15/2019  Upper endoscopy 01/21/2019, 01/22/2019, 01/26/2019  Significant Diagnostic Tests:  2/27 TEE >> negative for vegetations  2/28  MRI / MRA Head >> numerous scattered foci of acute ischemia throughout the cerebrum,  cerebellum, many of these lesions are most suggestive of embolic infarcts / others are in a deep watershed distribution, no proximal intracranial arterial occlusion, moderate narrowing of the M2 segments of both middle cerebral arteries  3/01 EGD >> clotted blood in the lower third of the esophagus and in the middle third of the esophagus. Clotted blood in the entire stomach. 3/02 EGD >> Red blood in the lower third of the esophagus. Clotted blood in the entire stomach.  Micro Data:  UC 2/22 >> 10k insignificant growth  BCx2 2/22 >>  Group C strep >> pan sensitive  BCx2 2/24 >> negative    Antimicrobials:   IV Zosyn 01/21/2019 Cefepime 2/22 > 2/24 Vancomycin 2/22 > 2/25 Metronidazole 2/22 > 2/13 Ancef 2/25 >3/1  Subjective: Patient laying in bed surrounded by family.  Patient denies any further melanotic stools.  Patient states he is very hungry.  Denies any shortness of breath.  No chest pain.  Patient just returning from upper endoscopy.  Objective: Vitals:   01/26/19 0925 01/26/19 0930 01/26/19 0935 01/26/19 1002  BP: 130/63  (!) 149/88 (!) 154/94  Pulse: 71 71 65 67  Resp: Temp:    97.7 F (36.5 C)  TempSrc:    Oral  SpO2: 100% 100% 100% 100%  Weight:      Height:        Intake/Output Summary (Last 24 hours) at 01/26/2019 1527 Last data filed at 01/26/2019 0906 Gross per 24 hour  Intake 403.42 ml  Output 775 ml  Net -371.58 ml   Filed Weights   01/19/19 2106 01/20/19 0423 01/22/19 0802  Weight: 123.3 kg 128.1 kg 128.1 kg    Examination:  General exam: Appears calm and comfortable  Respiratory system: Clear to auscultation anterior lung fields.  No wheezes, no crackles, no rhonchi. Respiratory effort normal. Cardiovascular system: S1 & S2 heard, RRR. No JVD, murmurs, rubs, gallops or clicks. No pedal edema. Gastrointestinal system: Abdomen is obese, nondistended, soft and nontender. No organomegaly or masses felt. Normal bowel sounds heard. Central nervous system: Alert and oriented.  Dysarthric speech.  Right upper extremity weakness 4/5, 2-3/5 right lower extremity weakness.  Mild right facial droop.  No focal neurological deficits. Extremities: 5/5 left upper and left lower extremity strength.  4/5 right upper extremity strength.2-3/5 right lower extremity strength. Skin: No rashes, lesions or ulcers Psychiatry: Judgement and insight appear normal. Mood & affect appropriate.     Data Reviewed: I have personally reviewed following labs and imaging studies  CBC: Recent Labs  Lab  01/23/19 1206 01/23/19 1755 01/24/19 0606 01/25/19 0418 01/26/19 0225  WBC 21.1* 21.2* 20.6* 16.8* 16.8*  NEUTROABS  --   --   --  15.3*  --   HGB 8.6* 9.0* 8.9* 8.8* 8.6*  HCT 26.7* 26.8* 27.4* 27.2* 27.6*  MCV 79.7* 80.0 80.8 82.9 84.7  PLT 181 179 173 145* 148*   Basic Metabolic Panel: Recent Labs  Lab 01/22/19 0217 01/23/19 0354 01/24/19 0606 01/25/19 0418 01/26/19 0225  NA 139 142 145 147* 145  K 5.5* 4.7 3.8 3.8 3.6  CL 115* 118* 119* 118* 117*  CO2 15* 17* 20* 21* 18*  GLUCOSE 126* 119* 147* 116* 108*  BUN 71* 72* 56* 44* 37*  CREATININE 1.87* 1.95* 1.74* 1.50* 1.35*  CALCIUM 7.5* 8.0* 8.1* 8.1* 7.7*  MG  --   --   --  2.6*  --   PHOS  --   --   --  3.1  --    GFR: Estimated Creatinine Clearance: 73.5 mL/min (A) (by C-G formula based on SCr of 1.35 mg/dL (H)). Liver Function Tests: Recent Labs  Lab 01/23/19 0354 01/24/19 0606 01/25/19 0418 01/26/19 0225  AST 71* 20 22 21   ALT 6 5 7 6   ALKPHOS 68 71 72 69  BILITOT 0.9 1.0 1.3* 1.5*  PROT 6.2* 6.6 6.4* 6.8  ALBUMIN 2.1* 2.2* 2.1* 2.0*   No results for input(s): LIPASE, AMYLASE in the last 168 hours. No results for input(s): AMMONIA in the last 168 hours. Coagulation Profile: Recent Labs  Lab 01/24/19 0606  INR 1.3*   Cardiac Enzymes: No results for input(s): CKTOTAL, CKMB, CKMBINDEX, TROPONINI in the last 168 hours. BNP (last 3 results) No results for input(s): PROBNP in the last 8760 hours. HbA1C: No results for input(s): HGBA1C in the last 72 hours. CBG: Recent Labs  Lab 01/22/19 1247 01/22/19 1520 01/24/19 1928 01/26/19 1001 01/26/19 1118  GLUCAP 99 100* 116* 101* 108*   Lipid Profile: No results for input(s): CHOL, HDL, LDLCALC, TRIG, CHOLHDL, LDLDIRECT in the last 72 hours. Thyroid Function Tests: No results for input(s): TSH, T4TOTAL, FREET4, T3FREE, THYROIDAB in the last 72 hours. Anemia Panel: No results for input(s): VITAMINB12, FOLATE, FERRITIN, TIBC, IRON, RETICCTPCT in the  last 72 hours. Sepsis Labs: No results for input(s): PROCALCITON, LATICACIDVEN in the last 168 hours.  Recent Results (from the past 240 hour(s))  MRSA PCR Screening     Status: None   Collection Time: 01/22/19  3:22 PM  Result Value Ref Range Status   MRSA by PCR NEGATIVE NEGATIVE Final    Comment:        The GeneXpert MRSA Assay (FDA approved for NASAL specimens only), is one component of a comprehensive MRSA colonization surveillance program. It is not intended to diagnose MRSA infection nor to guide or monitor treatment for MRSA infections. Performed at [facility], [address] [place], [name] [address]   Culture, blood (routine x 2)     Status: None (Preliminary result)   Collection Time: 01/25/19  6:41 PM  Result Value Ref Range Status   Specimen Description BLOOD RIGHT FOREARM  Final   Special Requests   Final    BOTTLES DRAWN AEROBIC ONLY Blood Culture results may not be optimal due to an inadequate volume of blood received in culture bottles   Culture   Final    NO GROWTH < 24 HOURS Performed at [facility], [address] [place], [name] [address]    Report Status PENDING  Incomplete  Culture, blood (routine x 2)     Status: None (Preliminary result)   Collection Time: 01/25/19  7:10 PM  Result Value Ref Range Status   Specimen Description BLOOD RIGHT HAND  Final   Special Requests   Final    BOTTLES DRAWN AEROBIC ONLY Blood Culture adequate volume   Culture   Final    NO GROWTH < 24 HOURS Performed at [facility], [address], [place], [name] [address]    Report Status PENDING  Incomplete         Radiology Studies: No results found.      Scheduled Meds: . atorvastatin  80 mg Oral q1800  . pantoprazole  40 mg Intravenous Q12H  . sodium chloride flush  10-40 mL Intracatheter Q12H   Continuous Infusions: . sodium chloride 0 mL/hr at 01/25/19 0556  . piperacillin-tazobactam (ZOSYN)  IV 3.375 g (  01/26/19 1450)      LOS: 13 days    Time spent: 40 minutes    Ramiro Harvest, MD [facility]  If 7PM-7AM, please contact night-coverage [email or web address] 01/26/2019, 3:27 PM

## 2019-01-26 NOTE — Anesthesia Postprocedure Evaluation (Signed)
Anesthesia Post Note  Patient: Joel Rogers  Procedure(s) Performed: ESOPHAGOGASTRODUODENOSCOPY (EGD) WITH PROPOFOL (N/A ) BIOPSY     Patient location during evaluation: Endoscopy Anesthesia Type: MAC Level of consciousness: awake and alert Pain management: pain level controlled Vital Signs Assessment: post-procedure vital signs reviewed and stable Respiratory status: spontaneous breathing, nonlabored ventilation, respiratory function stable and patient connected to nasal cannula oxygen Cardiovascular status: blood pressure returned to baseline and stable Postop Assessment: no apparent nausea or vomiting Anesthetic complications: no    Last Vitals:  Vitals:   01/26/19 0925 01/26/19 0930  BP: 130/63   Pulse: 71 71  Resp: 15 17  Temp:    SpO2: 100% 100%    Last Pain:  Vitals:   01/26/19 0916  TempSrc: Oral  PainSc: 0-No pain                 Barnet Glasgow

## 2019-01-27 ENCOUNTER — Encounter (HOSPITAL_COMMUNITY): Payer: Self-pay | Admitting: Gastroenterology

## 2019-01-27 DIAGNOSIS — E8771 Transfusion associated circulatory overload: Secondary | ICD-10-CM

## 2019-01-27 LAB — CBC
HCT: 28.5 % — ABNORMAL LOW (ref 39.0–52.0)
Hemoglobin: 8.9 g/dL — ABNORMAL LOW (ref 13.0–17.0)
MCH: 26.2 pg (ref 26.0–34.0)
MCHC: 31.2 g/dL (ref 30.0–36.0)
MCV: 83.8 fL (ref 80.0–100.0)
Platelets: 143 10*3/uL — ABNORMAL LOW (ref 150–400)
RBC: 3.4 MIL/uL — ABNORMAL LOW (ref 4.22–5.81)
RDW: 22.3 % — AB (ref 11.5–15.5)
WBC: 14.1 10*3/uL — ABNORMAL HIGH (ref 4.0–10.5)
nRBC: 0.3 % — ABNORMAL HIGH (ref 0.0–0.2)

## 2019-01-27 LAB — BASIC METABOLIC PANEL
Anion gap: 3 — ABNORMAL LOW (ref 5–15)
BUN: 25 mg/dL — ABNORMAL HIGH (ref 8–23)
CALCIUM: 8 mg/dL — AB (ref 8.9–10.3)
CO2: 21 mmol/L — ABNORMAL LOW (ref 22–32)
Chloride: 117 mmol/L — ABNORMAL HIGH (ref 98–111)
Creatinine, Ser: 1.18 mg/dL (ref 0.61–1.24)
GFR calc Af Amer: >60 mL/min (ref >60)
GFR calc non Af Amer: >60 mL/min (ref >60)
Glucose, Bld: 110 mg/dL — ABNORMAL HIGH (ref 70–99)
Potassium: 3.7 mmol/L (ref 3.5–5.1)
Sodium: 141 mmol/L (ref 135–145)

## 2019-01-27 LAB — GLUCOSE, CAPILLARY: Glucose-Capillary: 123 mg/dL — ABNORMAL HIGH (ref 70–99)

## 2019-01-27 MED ORDER — FUROSEMIDE 10 MG/ML IJ SOLN
20.0000 mg | Freq: Once | INTRAMUSCULAR | Status: AC
  Administered 2019-01-27: 20 mg via INTRAVENOUS
  Filled 2019-01-27: qty 2

## 2019-01-27 MED ORDER — CEFAZOLIN SODIUM-DEXTROSE 2-4 GM/100ML-% IV SOLN
2.0000 g | Freq: Three times a day (TID) | INTRAVENOUS | Status: DC
  Administered 2019-01-27 – 2019-01-30 (×10): 2 g via INTRAVENOUS
  Filled 2019-01-27 (×11): qty 100

## 2019-01-27 NOTE — Progress Notes (Signed)
PROGRESS NOTE    Joel Rogers  WUJ:[id number] DOB: [birth year] DOA: 01/13/2019 PCP: [name], MD    Brief Narrative:  68 y/o admitted to [facility] w/ sepsis and Streptococcal bacteremia due to R LE cellulitis. He was treated with IV antibiotics and improved. Due to his hx of bioprosthetic aortic valve, TEE was performed, and did not show any vegetations. Since admission he was noted to have persistent right-sided weakness. An MRI brain that showed bilateral CVAs and small subarachnoid hemorrhage.He was transferred to [facility] 2/29 for a Neurology evaluation.  Repeat CT head on February 29 showedprior infarcts and Stable small volume of subarachnoid hemorrhage within the left central sulcus.  On 3/1 he developed coffee-ground emesis w/ acute blood loss anemia. At Endoscopy 01/21/2019, and again on 3/2 he was found to have a significant amount of blood/blood clot in the stomach and distal esophagus, such that an intervention could not be performed.  He has remained off anticoagulation and off antiplatelet agents.  He has received total of 6 units PRBC since this admission.  Patient placed on octreotide drip while here and GI following.  Octreotide drip has been discontinued.  Patient underwent upper endoscopy today 01/26/2019 with findings concerning for resolving ischemic changes in the region of the gastric pouch and anastomosis.,  No active bleeding noted.   Assessment & Plan:   Principal Problem:   UGIB (upper gastrointestinal bleed) Active Problems:   Acute CVA (cerebrovascular accident) (HCC)   Bacteremia due to Streptococcus   Obstructive sleep apnea   Chronic atrial fibrillation   HTN (hypertension)   Hyperlipemia   Status post gastric bypass for obesity   Vitamin D deficiency   History of Severe aortic stenosis   Varicose veins of right lower extremity with complications   Pulmonary hypertension, unspecified (HCC)   History of subdural hematoma   S/P TAVR (transcatheter aortic valve  replacement)   Sepsis (HCC)   Acute renal failure (HCC)   Right shoulder pain   Cellulitis   Upper GI bleed  1 upper GI bleed Patient status post EGD x3.  Likely secondary to ischemic changes in the region of the gastric pouch and anastomosis which was characterized by necrotic, sloughing tissue as well as some mucosal erythema and erosions noted on upper endoscopy of 01/26/2019.  Per GI upper endoscopy done 01/26/2019 with no active bleeding noted or blood in the GI tract.  Patient was on octreotide drip which has subsequently been discontinued.  Patient status post 6 units packed red blood cells during this hospitalization.  Hemoglobin currently stable at 8.9.  Patient started on clear liquids per GI which he tolerated and has been advanced to full liquid diet today per GI.  Continue Protonix 40 mg IV every 12 hours.  Follow H&H.  Appreciate GI input and recommendations.  2.  Right lower extremity cellulitis with severe sepsis/Streptococcus bacteremia Present on admission at any pain.  Patient underwent a TEE which was negative for vegetations on bioprosthetic aortic valve.  PICC line has been placed.  2 weeks of IV antibiotics have been planned patient currently on IV Zosyn day #6/7.  Last blood culture from February 22 grew 1 bottle of pansensitive group C streptococcus.  Patient currently afebrile.  Repeat blood cultures from 01/25/2019 with no growth to date.  Patient with a leukocytosis which is slowly trending down.  Discontinue IV Zosyn and placed on IV Ancef to complete 2-week course of antibiotic treatment.  Antibiotic day #11/14. Follow for now.  3.  Acute embolic CVA Patient with some residual right-sided weakness and dysarthria as well as some probable dysphagia.  MRI MRA head with numerous scattered foci of acute ischemia throughout the cerebrum and cerebellum suggestive of embolic infarcts.  Others noted in the deep watershed distribution as seen in the setting of hypoperfusion events.  Also  noted was concern for subarachnoid blood.  Doppler ultrasounds negative for DVT.  2D echo/TEE negative for any vegetations and no source of emboli noted.  Patient with carotid Dopplers on 07/10/2018 and as such those were not repeated.  Patient currently not a candidate for aspirin or Eliquis given ongoing upper GI bleed/issues.  Patient understands the risk of bleeding the risk of stroke with and without anticoagulation.  Patient has been seen in consultation by neurology initially during the hospitalization.  Continue Lipitor.  Patient being followed by GI who recommend holding anticoagulation for several days to at least 1 week.  Could likely resume anticoagulation per GI in approximately 5 days.  PT/OT.  Likely needs inpatient rehab.  Follow.  4. ?? aspiration pneumonia Patient noted to have coughing episodes while eating.  Chest x-ray with left lung base opacity likely concern for aspiration.  Speech therapy evaluation pending.  Patient currently on clear liquids.  Continue aspiration precautions.  Patient is transition to Zosyn on January 21, 2019 to complete a 5-day course to cover for aspiration.  Follow.  5.  Acute blood loss anemia Secondary to problem #1.  Status post 6 units packed red blood cells.  H&H stable at 8.9.  Follow.  6.  Acute kidney injury on chronic kidney disease stage III Ackley secondary to ischemic ATN in the setting of sepsis, hemodynamic compromise due to severe anemia and GI bleed.  Renal ultrasound with no acute abnormalities noted.   Creatinine peaked at 1.9 during the hospital course seems to be trending down.  Creatinine currently at 1.18.  We will give a dose of Lasix 20 mg IV x1 due to volume overload from aggressive fluid resuscitation and transfusion of packed red blood cells during the hospitalization.  Nephrology following.  Follow.  7.  Metabolic acidosis Likely secondary to problem #6.  Bicarb was discontinued per nephrology.  Bicarbonate 21.  Follow for now.  If  continued decreasing bicarb will place on bicarb tablets.  Nephrology following.  8.  Thrombocytopenia Currently off antiplatelet agents.  Platelet count improving and currently at 143.  Follow.  9.  Chronic atrial fibrillation Currently rate controlled.  Patient was previously on Eliquis which was held secondary to thrombocytopenia as well as acute GI bleed now.  Rate controlled.  Cardizem on hold.  Per GI continue to hold anticoagulation for several days up to about a week.  Follow.  10.  Small subarachnoid hemorrhage Noted on CT head 01/20/2019.  May repeat head CT if mental status worsens and also prior to initiation of anticoagulation.  11.  Hypertension BP noted to be 147/97 this morning.  We will give a dose of Lasix 20 mg IV x1.  Cardizem and home regimen spironolactone on hold.  Follow.   12.  Obstructive sleep apnea CPAP nightly.  13.  Volume overload Patient with lower extremity edema.  Likely secondary to aggressive fluid resuscitation during this hospitalization as well as transfusion of packed red blood cells.  Patient denies any shortness of breath.  Patient with a urine output of 1.550 L over the past 24 hours.  Patient noted during the hospitalization to be in acute renal failure.  Renal function has been trending down.  Patient's current weight is 126.3 kg from 110.7 kg on admission.  We will give Lasix 20 mg IV x1 and monitor urine output as well as renal function.   DVT prophylaxis: SCDs due to recent GI bleed. Code Status: Full Family Communication: Updated patient and family at bedside. Disposition Plan: Likely CIR when medically stable.   Consultants:   Inpatient rehab: Dr.Swartz 01/25/2019  Nephrology: Dr. Ronalee BeltsBhandari 01/23/2019  PCCM Dr. Molli KnockYacoub 01/22/2019  Interventional radiology: Dr. Fredia SorrowYamagata 01/22/2019  General surgery: Dr.Tsuei 01/22/2019  Gastroenterology: Dr. Ewing SchleinMagod 01/21/2019  Neurology: Dr. Otelia LimesLindzen 01/20/2019  Procedures:   CT head 01/20/2019  Chest x-ray  01/13/2019, 01/15/2019, 01/21/2019  MRI MRA head 01/19/2019  MRI C-spine 01/19/2019  Renal ultrasound 01/24/2019  Right lower extremity Dopplers 01/14/2019- negative for DVT  Right upper extremity Dopplers 01/18/2019--- negative for DVT  Plain films of the right hip and pelvis 01/16/2019  Right upper quadrant ultrasound 01/18/2019  TEE 01/18/2019-  2D echo 01/15/2019  Upper endoscopy 01/21/2019, 01/22/2019, 01/26/2019  Status post 6 units packed red blood cells.  Significant Diagnostic Tests:  2/27 TEE >> negative for vegetations  2/28  MRI / MRA Head >> numerous scattered foci of acute ischemia throughout the cerebrum, cerebellum, many of these lesions are most suggestive of embolic infarcts / others are in a deep watershed distribution, no proximal intracranial arterial occlusion, moderate narrowing of the M2 segments of both middle cerebral arteries  3/01 EGD >> clotted blood in the lower third of the esophagus and in the middle third of the esophagus. Clotted blood in the entire stomach. 3/02 EGD >> Red blood in the lower third of the esophagus. Clotted blood in the entire stomach.  Micro Data:  UC 2/22 >> 10k insignificant growth  BCx2 2/22 >> Group C strep >> pan sensitive  BCx2 2/24 >> negative    Antimicrobials:   IV Zosyn 01/21/2019>>> 01/27/2019 Cefepime 2/22 > 2/24 Vancomycin 2/22 > 2/25 Metronidazole 2/22 > 2/13 Ancef 2/25 >3/1 IV Ancef 01/27/2019  Subjective: Patient in bed.  Family members around bedside.  Patient denies any chest pain.  Patient denies any shortness of breath.  Patient tolerated clear liquids and had full liquid diet this morning which he tolerated per family.  Patient denies any melanotic stools.  No hematemesis.  Patient states he has been having good urine output.   Objective: Vitals:   01/26/19 1002 01/26/19 1618 01/26/19 2324 01/27/19 0842  BP: (!) 154/94 138/77 133/79 (!) 147/97  Pulse: 67 (!) 52 61 65  Resp:   18   Temp: 97.7 F (36.5 C) 98.3 F  (36.8 C) 98.3 F (36.8 C) 98.2 F (36.8 C)  TempSrc: Oral Oral Oral Oral  SpO2: 100% 100% 99% 99%  Weight:      Height:        Intake/Output Summary (Last 24 hours) at 01/27/2019 1109 Last data filed at 01/26/2019 2300 Gross per 24 hour  Intake 10 ml  Output 1550 ml  Net -1540 ml   Filed Weights   01/19/19 2106 01/20/19 0423 01/22/19 0802  Weight: 123.3 kg 128.1 kg 128.1 kg    Examination:  General exam: NAD. Respiratory system: Lungs slightly auscultation bilaterally anterior lung fields.  No wheezes, no crackles, no rhonchi.  Normal respiratory effort.  Cardiovascular system: Regular rate rhythm no murmurs rubs.  No JVD.  2+ bilateral lower extremity edema.  Gastrointestinal system: Abdomen is obese, nondistended, soft and nontender. No organomegaly or masses felt. Normal  bowel sounds heard. Central nervous system: Alert and oriented.  Dysarthric speech.  Right upper extremity weakness 4/5, 2-3/5 right lower extremity weakness.  Mild right facial droop.  No focal neurological deficits. Extremities: 5/5 left upper and left lower extremity strength.  4/5 right upper extremity strength.2-3/5 right lower extremity strength. Skin: No rashes, lesions or ulcers Psychiatry: Judgement and insight appear normal. Mood & affect appropriate.     Data Reviewed: I have personally reviewed following labs and imaging studies  CBC: Recent Labs  Lab 01/23/19 1755 01/24/19 0606 01/25/19 0418 01/26/19 0225 01/27/19 0512  WBC 21.2* 20.6* 16.8* 16.8* 14.1*  NEUTROABS  --   --  15.3*  --   --   HGB 9.0* 8.9* 8.8* 8.6* 8.9*  HCT 26.8* 27.4* 27.2* 27.6* 28.5*  MCV 80.0 80.8 82.9 84.7 83.8  PLT 179 173 145* 148* 143*   Basic Metabolic Panel: Recent Labs  Lab 01/23/19 0354 01/24/19 0606 01/25/19 0418 01/26/19 0225 01/27/19 0512  NA 142 145 147* 145 141  K 4.7 3.8 3.8 3.6 3.7  CL 118* 119* 118* 117* 117*  CO2 17* 20* 21* 18* 21*  GLUCOSE 119* 147* 116* 108* 110*  BUN 72* 56* 44* 37*  25*  CREATININE 1.95* 1.74* 1.50* 1.35* 1.18  CALCIUM 8.0* 8.1* 8.1* 7.7* 8.0*  MG  --   --  2.6*  --   --   PHOS  --   --  3.1  --   --    GFR: Estimated Creatinine Clearance: 84.1 mL/min (by C-G formula based on SCr of 1.18 mg/dL). Liver Function Tests: Recent Labs  Lab 01/23/19 0354 01/24/19 0606 01/25/19 0418 01/26/19 0225  AST 71* 20 22 21   ALT 6 5 7 6   ALKPHOS 68 71 72 69  BILITOT 0.9 1.0 1.3* 1.5*  PROT 6.2* 6.6 6.4* 6.8  ALBUMIN 2.1* 2.2* 2.1* 2.0*   No results for input(s): LIPASE, AMYLASE in the last 168 hours. No results for input(s): AMMONIA in the last 168 hours. Coagulation Profile: Recent Labs  Lab 01/24/19 0606  INR 1.3*   Cardiac Enzymes: No results for input(s): CKTOTAL, CKMB, CKMBINDEX, TROPONINI in the last 168 hours. BNP (last 3 results) No results for input(s): PROBNP in the last 8760 hours. HbA1C: No results for input(s): HGBA1C in the last 72 hours. CBG: Recent Labs  Lab 01/22/19 1247 01/22/19 1520 01/24/19 1928 01/26/19 1001 01/26/19 1118  GLUCAP 99 100* 116* 101* 108*   Lipid Profile: No results for input(s): CHOL, HDL, LDLCALC, TRIG, CHOLHDL, LDLDIRECT in the last 72 hours. Thyroid Function Tests: No results for input(s): TSH, T4TOTAL, FREET4, T3FREE, THYROIDAB in the last 72 hours. Anemia Panel: No results for input(s): VITAMINB12, FOLATE, FERRITIN, TIBC, IRON, RETICCTPCT in the last 72 hours. Sepsis Labs: No results for input(s): PROCALCITON, LATICACIDVEN in the last 168 hours.  Recent Results (from the past 240 hour(s))  MRSA PCR Screening     Status: None   Collection Time: 01/22/19  3:22 PM  Result Value Ref Range Status   MRSA by PCR NEGATIVE NEGATIVE Final    Comment:        The GeneXpert MRSA Assay (FDA approved for NASAL specimens only), is one component of a comprehensive MRSA colonization surveillance program. It is not intended to diagnose MRSA infection nor to guide or monitor treatment for MRSA  infections. Performed at [facility], [address], [place], [name] [address]   Culture, blood (routine x 2)  Status: None (Preliminary result)   Collection Time: 01/25/19  6:41 PM  Result Value Ref Range Status   Specimen Description BLOOD RIGHT FOREARM  Final   Special Requests   Final    BOTTLES DRAWN AEROBIC ONLY Blood Culture results may not be optimal due to an inadequate volume of blood received in culture bottles   Culture   Final    NO GROWTH 2 DAYS Performed at [facility], [address] [place], [name] [address]    Report Status PENDING  Incomplete  Culture, blood (routine x 2)     Status: None (Preliminary result)   Collection Time: 01/25/19  7:10 PM  Result Value Ref Range Status   Specimen Description BLOOD RIGHT HAND  Final   Special Requests   Final    BOTTLES DRAWN AEROBIC ONLY Blood Culture adequate volume   Culture   Final    NO GROWTH 2 DAYS Performed at [facility], [address], [place], [name] [address]    Report Status PENDING  Incomplete         Radiology Studies: No results found.      Scheduled Meds: . atorvastatin  80 mg Oral q1800  . pantoprazole  40 mg Intravenous Q12H  . sodium chloride flush  10-40 mL Intracatheter Q12H   Continuous Infusions: . sodium chloride 10 mL/hr at 01/27/19 0517  .  ceFAZolin (ANCEF) IV       LOS: 14 days    Time spent: 40 minutes    Ramiro Harvest, MD [facility]  If 7PM-7AM, please contact night-coverage [email or web address] 01/27/2019, 11:09 AM

## 2019-01-27 NOTE — Progress Notes (Signed)
Doing well, following yesterday's EGD which showed no active bleeding but evidence of resolving ischemic necrosis of the gastric pouch and gastroenteric anastomosis.  Hemoglobin continues to improve slightly, and renal function is also improving, essentially to normal at this point.  Patient is tolerating clear liquid diet but does not feel ready for solid food.  He is agreeable to advancement to full liquids.  Recommendations:  1.  Continue to monitor hemoglobin and renal function 2.  No clear evidence as to when to restart anticoagulation--ideally, would wait another 5 days or so 3.  Full liquid diet ordered; will evaluate patient's tolerance of that before deciding whether to advance to a soft diet.  Florencia Reasons, M.D. Pager [phone] If no answer or after 5 PM call [phone]

## 2019-01-27 NOTE — Progress Notes (Signed)
Pt refusing CPAP at this time.

## 2019-01-27 NOTE — Progress Notes (Signed)
Pharmacy Antibiotic Note  Joel Rogers is a 68 y.o. male admitted on 01/13/2019 with CVAs, and Strep bacteremia 2/2 RLE cellulitis.Marland Kitchen  Pharmacy has been consulted for Zosyn>>Ancef dosing.  ID: Strep bacteremia 2/2 RLE cellulitis. Has prosthetic valve, ECHO and 2/27 TEE  negative. Was switched to Zosyn when transferred to ICU. Afebrile, WBC down 14.1. Original plan: 2 wks Ancef, end date 3/9  Ancef 2/25 >> 3/1, 3/7>> Zosyn 3/1 >> 3/7 Vanc 2/23 >>2/25 Cefepime 2/23 >>2/24 Flagyl 2/23 >> 2/24  2/24 BCx - negative 2/22 BCx - GrpC strep pan sensitive 2/24 UCx: <10,000 CFU/ml, insignificant growth  Plan: D/c Zosyn Ancef 2g IV q 8hrs. End date?   Height: 6\' 1"  (185.4 cm) Weight: 282 lb 6.6 oz (128.1 kg) IBW/kg (Calculated) : 79.9  Temp (24hrs), Avg:98.3 F (36.8 C), Min:97.7 F (36.5 C), Max:98.7 F (37.1 C)  Recent Labs  Lab 01/23/19 0354  01/23/19 1755 01/24/19 0606 01/25/19 0418 01/26/19 0225 01/27/19 0512  WBC 19.9*   < > 21.2* 20.6* 16.8* 16.8* 14.1*  CREATININE 1.95*  --   --  1.74* 1.50* 1.35* 1.18   < > = values in this interval not displayed.    Estimated Creatinine Clearance: 84.1 mL/min (by C-G formula based on SCr of 1.18 mg/dL).    No Known Allergies  Canio Winokur S. Merilynn Finland, PharmD, BCPS Clinical Staff Pharmacist Misty Stanley Stillinger 01/27/2019 8:21 AM

## 2019-01-28 LAB — CBC
HCT: 27.8 % — ABNORMAL LOW (ref 39.0–52.0)
Hemoglobin: 9 g/dL — ABNORMAL LOW (ref 13.0–17.0)
MCH: 26.9 pg (ref 26.0–34.0)
MCHC: 32.4 g/dL (ref 30.0–36.0)
MCV: 83.2 fL (ref 80.0–100.0)
Platelets: 136 10*3/uL — ABNORMAL LOW (ref 150–400)
RBC: 3.34 MIL/uL — ABNORMAL LOW (ref 4.22–5.81)
RDW: 21.8 % — ABNORMAL HIGH (ref 11.5–15.5)
WBC: 13.8 10*3/uL — ABNORMAL HIGH (ref 4.0–10.5)
nRBC: 0.2 % (ref 0.0–0.2)

## 2019-01-28 LAB — BASIC METABOLIC PANEL
Anion gap: 5 (ref 5–15)
BUN: 19 mg/dL (ref 8–23)
CO2: 20 mmol/L — AB (ref 22–32)
Calcium: 7.8 mg/dL — ABNORMAL LOW (ref 8.9–10.3)
Chloride: 112 mmol/L — ABNORMAL HIGH (ref 98–111)
Creatinine, Ser: 1.14 mg/dL (ref 0.61–1.24)
GFR calc Af Amer: >60 mL/min (ref >60)
GFR calc non Af Amer: >60 mL/min (ref >60)
Glucose, Bld: 118 mg/dL — ABNORMAL HIGH (ref 70–99)
Potassium: 3.5 mmol/L (ref 3.5–5.1)
Sodium: 137 mmol/L (ref 135–145)

## 2019-01-28 MED ORDER — POTASSIUM CHLORIDE CRYS ER 20 MEQ PO TBCR
40.0000 meq | EXTENDED_RELEASE_TABLET | Freq: Once | ORAL | Status: AC
  Administered 2019-01-28: 40 meq via ORAL
  Filled 2019-01-28: qty 2

## 2019-01-28 MED ORDER — SUCRALFATE 1 GM/10ML PO SUSP
1.0000 g | Freq: Four times a day (QID) | ORAL | Status: DC
  Administered 2019-01-28 – 2019-01-30 (×9): 1 g via ORAL
  Filled 2019-01-28 (×9): qty 10

## 2019-01-28 MED ORDER — FUROSEMIDE 10 MG/ML IJ SOLN
20.0000 mg | Freq: Two times a day (BID) | INTRAMUSCULAR | Status: AC
  Administered 2019-01-28 (×2): 20 mg via INTRAVENOUS
  Filled 2019-01-28 (×2): qty 2

## 2019-01-28 NOTE — Progress Notes (Signed)
PROGRESS NOTE    Joel Rogers  ZOX:[id number] DOB: May 07, 1951 DOA: 01/13/2019 PCP: [name], MD    Brief Narrative:  68 y/o admitted to [facility] w/ sepsis and Streptococcal bacteremia due to R LE cellulitis. He was treated with IV antibiotics and improved. Due to his hx of bioprosthetic aortic valve, TEE was performed, and did not show any vegetations. Since admission he was noted to have persistent right-sided weakness. An MRI brain that showed bilateral CVAs and small subarachnoid hemorrhage.He was transferred to [facility] 2/29 for a Neurology evaluation.  Repeat CT head on February 29 showedprior infarcts and Stable small volume of subarachnoid hemorrhage within the left central sulcus.  On 3/1 he developed coffee-ground emesis w/ acute blood loss anemia. At Endoscopy 01/21/2019, and again on 3/2 he was found to have a significant amount of blood/blood clot in the stomach and distal esophagus, such that an intervention could not be performed.  He has remained off anticoagulation and off antiplatelet agents.  He has received total of 6 units PRBC since this admission.  Patient placed on octreotide drip while here and GI following.  Octreotide drip has been discontinued.  Patient underwent upper endoscopy today 01/26/2019 with findings concerning for resolving ischemic changes in the region of the gastric pouch and anastomosis.,  No active bleeding noted.   Assessment & Plan:   Principal Problem:   UGIB (upper gastrointestinal bleed) Active Problems:   Acute CVA (cerebrovascular accident) (HCC)   Bacteremia due to Streptococcus   Obstructive sleep apnea   Chronic atrial fibrillation   HTN (hypertension)   Hyperlipemia   Status post gastric bypass for obesity   Vitamin D deficiency   History of Severe aortic stenosis   Varicose veins of right lower extremity with complications   Pulmonary hypertension, unspecified (HCC)   History of subdural hematoma   S/P TAVR (transcatheter aortic valve  replacement)   Sepsis (HCC)   Acute renal failure (HCC)   Right shoulder pain   Cellulitis   Upper GI bleed  1 upper GI bleed Patient status post EGD x3.  Likely secondary to ischemic changes in the region of the gastric pouch and anastomosis which was characterized by necrotic, sloughing tissue as well as some mucosal erythema and erosions noted on upper endoscopy of 01/26/2019.  Per GI upper endoscopy done 01/26/2019 with no active bleeding noted or blood in the GI tract.  Patient was on octreotide drip which has subsequently been discontinued.  Patient status post 6 units packed red blood cells during this hospitalization.  Hemoglobin currently stable at 9.  Patient started on clear liquids per GI which he tolerated and has been advanced to full liquid diet which he tolerated as well.  Patient has been advanced to a soft diet which we will follow for now.  Continue Protonix 40 mg IV every 12 hours.  Add sucralfate per GI recommendations.  Continue PPI twice daily and 4 times daily sucralfate until discharge and then subsequently once daily PPI indefinitely for ulcer prophylaxis per GI recommendations. Appreciate GI input and recommendations.  2.  Right lower extremity cellulitis with severe sepsis/Streptococcus bacteremia Present on admission at any pain.  Patient underwent a TEE which was negative for vegetations on bioprosthetic aortic valve.  PICC line has been placed.  2 weeks of IV antibiotics have been planned patient currently on IV Zosyn day #6/7.  Last blood culture from February 22 grew 1 bottle of pansensitive group C streptococcus.  Patient currently afebrile.  Repeat  blood cultures from 01/25/2019 with no growth to date.  Patient with a leukocytosis which is slowly trending down.  Discontinued IV Zosyn and placed on IV Ancef to complete 2-week course of antibiotic treatment.  Antibiotic day #12/14. Follow for now.  3.  Acute embolic CVA Patient with some residual right-sided weakness and  dysarthria as well as some probable dysphagia.  MRI MRA head with numerous scattered foci of acute ischemia throughout the cerebrum and cerebellum suggestive of embolic infarcts.  Others noted in the deep watershed distribution as seen in the setting of hypoperfusion events.  Also noted was concern for subarachnoid blood.  Doppler ultrasounds negative for DVT.  2D echo/TEE negative for any vegetations and no source of emboli noted.  Patient with carotid Dopplers on 07/10/2018 and as such those were not repeated.  Patient currently not a candidate for aspirin or Eliquis given ongoing upper GI bleed/issues.  Patient understands the risk of bleeding the risk of stroke with and without anticoagulation.  Patient has been seen in consultation by neurology initially during the hospitalization.  Continue Lipitor.  Patient being followed by GI who recommend holding anticoagulation for at least 5 more days. Could likely resume anticoagulation per GI in approximately 5 days.  PT/OT.  Likely needs inpatient rehab.  Follow.  4. ?? aspiration pneumonia Patient noted to have coughing episodes while eating.  Chest x-ray with left lung base opacity likely concern for aspiration.  Speech therapy evaluation pending.  Patient currently on clear liquids.  Continue aspiration precautions.  Patient was transitioned to Zosyn on January 21, 2019 to complete a 5-day course to cover for aspiration.  Follow.  5.  Acute blood loss anemia Secondary to problem #1.  Status post 6 units packed red blood cells.  H&H stable at 9.  Follow.  6.  Acute kidney injury on chronic kidney disease stage III Ackley secondary to ischemic ATN in the setting of sepsis, hemodynamic compromise due to severe anemia and GI bleed.  Renal ultrasound with no acute abnormalities noted.   Creatinine peaked at 1.9 during the hospital course seems to be trending down.  Creatinine currently at 1.14.  Patient given a dose of Lasix 20 mg IV x1 due to volume overload from  aggressive fluid resuscitation and transfusion of packed red blood cells.  Patient placed on Lasix 20 mg IV every 12 hours.  Monitor renal function closely.  Nephrology following.  Follow.  7.  Metabolic acidosis Likely secondary to problem #6.  Bicarb was discontinued per nephrology.  Bicarbonate 20.  Follow for now.  If continued decreasing bicarb will place on bicarb tablets.  Nephrology following.  8.  Thrombocytopenia Currently off antiplatelet agents.  Platelet count was improving however seems to be slowly trending back down.  Follow for now.   9.  Chronic atrial fibrillation Currently rate controlled.  Patient was previously on Eliquis which was held secondary to thrombocytopenia as well as acute GI bleed now.  Rate controlled.  Cardizem on hold.  Per GI continue to hold anticoagulation for several days up to about 5 more days.  Follow.  10.  Small subarachnoid hemorrhage Noted on CT head 01/20/2019.  May repeat head CT if mental status worsens and also prior to initiation of anticoagulation.  11.  Hypertension BP noted to be 144/94 this morning.  We will give a dose of Lasix 20 mg IV every 12 hours.  Cardizem and home regimen spironolactone on hold.  Follow.   12.  Obstructive sleep apnea  CPAP nightly.  13.  Volume overload Patient with lower extremity edema.  Likely secondary to aggressive fluid resuscitation during this hospitalization as well as transfusion of packed red blood cells.  Patient denies any shortness of breath.  Patient with a urine output of 2.750 L over the past 24 hours after giving a dose of Lasix 20 mg IV x1..  Patient noted during the hospitalization to be in acute renal failure.  Renal function has been trending down.  Patient's current weight from 01/27/2019 was 126.3 kg from 110.7 kg on admission.  We will give Lasix 20 mg IV every 12 hours x 2 doses, and monitor urine output as well as renal function.   DVT prophylaxis: SCDs due to recent GI bleed. Code  Status: Full Family Communication: Updated patient and wife at bedside. Disposition Plan: Likely CIR when medically stable.   Consultants:   Inpatient rehab: Dr.Swartz 01/25/2019  Nephrology: Dr. Ronalee Belts 01/23/2019  PCCM Dr. Molli Knock 01/22/2019  Interventional radiology: Dr. Fredia Sorrow 01/22/2019  General surgery: Dr.Tsuei 01/22/2019  Gastroenterology: Dr. Ewing Schlein 01/21/2019  Neurology: Dr. Otelia Limes 01/20/2019  Procedures:   CT head 01/20/2019  Chest x-ray 01/13/2019, 01/15/2019, 01/21/2019  MRI MRA head 01/19/2019  MRI C-spine 01/19/2019  Renal ultrasound 01/24/2019  Right lower extremity Dopplers 01/14/2019- negative for DVT  Right upper extremity Dopplers 01/18/2019--- negative for DVT  Plain films of the right hip and pelvis 01/16/2019  Right upper quadrant ultrasound 01/18/2019  TEE 01/18/2019-  2D echo 01/15/2019  Upper endoscopy 01/21/2019, 01/22/2019, 01/26/2019  Status post 6 units packed red blood cells.  Significant Diagnostic Tests:  2/27 TEE >> negative for vegetations  2/28  MRI / MRA Head >> numerous scattered foci of acute ischemia throughout the cerebrum, cerebellum, many of these lesions are most suggestive of embolic infarcts / others are in a deep watershed distribution, no proximal intracranial arterial occlusion, moderate narrowing of the M2 segments of both middle cerebral arteries  3/01 EGD >> clotted blood in the lower third of the esophagus and in the middle third of the esophagus. Clotted blood in the entire stomach. 3/02 EGD >> Red blood in the lower third of the esophagus. Clotted blood in the entire stomach.  Micro Data:  UC 2/22 >> 10k insignificant growth  BCx2 2/22 >> Group C strep >> pan sensitive  BCx2 2/24 >> negative    Antimicrobials:   IV Zosyn 01/21/2019>>> 01/27/2019 Cefepime 2/22 > 2/24 Vancomycin 2/22 > 2/25 Metronidazole 2/22 > 2/13 Ancef 2/25 >3/1 IV Ancef 01/27/2019  Subjective: Patient laying in bed.  Wife at bedside.  Denies any shortness of  breath.  No chest pain.  Tolerated full liquid diet yesterday.  No melanotic stools.   Objective: Vitals:   01/27/19 0842 01/27/19 1109 01/27/19 2300 01/28/19 0732  BP: (!) 147/97  135/88 (!) 144/94  Pulse: 65  70 62  Resp:   17 16  Temp: 98.2 F (36.8 C)  (!) 97.3 F (36.3 C) 98.4 F (36.9 C)  TempSrc: Oral  Oral Oral  SpO2: 99%  100% 99%  Weight:  126.3 kg    Height:        Intake/Output Summary (Last 24 hours) at 01/28/2019 1107 Last data filed at 01/28/2019 0915 Gross per 24 hour  Intake 1008 ml  Output 2750 ml  Net -1742 ml   Filed Weights   01/20/19 0423 01/22/19 0802 01/27/19 1109  Weight: 128.1 kg 128.1 kg 126.3 kg    Examination:  General exam: NAD. Respiratory system: CTA  B.  No wheezes, no crackles, no rhonchi.  Speaking in full sentences.  No use of accessory muscles of respiration. Cardiovascular system: RRR no murmurs rubs or gallops.  No JVD.  2+ bilateral lower extremity edema.  Gastrointestinal system: Abdomen is obese, nondistended, soft and nontender. No organomegaly or masses felt. Normal bowel sounds heard. Central nervous system: Alert and oriented.  Dysarthric speech.  Right upper extremity weakness 4/5, 2-3/5 right lower extremity weakness.  Mild right facial droop.  No focal neurological deficits. Extremities: 5/5 left upper and left lower extremity strength.  4/5 right upper extremity strength.2-3/5 right lower extremity strength. Skin: No rashes, lesions or ulcers Psychiatry: Judgement and insight appear normal. Mood & affect appropriate.     Data Reviewed: I have personally reviewed following labs and imaging studies  CBC: Recent Labs  Lab 01/24/19 0606 01/25/19 0418 01/26/19 0225 01/27/19 0512 01/28/19 0530  WBC 20.6* 16.8* 16.8* 14.1* 13.8*  NEUTROABS  --  15.3*  --   --   --   HGB 8.9* 8.8* 8.6* 8.9* 9.0*  HCT 27.4* 27.2* 27.6* 28.5* 27.8*  MCV 80.8 82.9 84.7 83.8 83.2  PLT 173 145* 148* 143* 136*   Basic Metabolic Panel: Recent  Labs  Lab 01/24/19 0606 01/25/19 0418 01/26/19 0225 01/27/19 0512 01/28/19 0530  NA 145 147* 145 141 137  K 3.8 3.8 3.6 3.7 3.5  CL 119* 118* 117* 117* 112*  CO2 20* 21* 18* 21* 20*  GLUCOSE 147* 116* 108* 110* 118*  BUN 56* 44* 37* 25* 19  CREATININE 1.74* 1.50* 1.35* 1.18 1.14  CALCIUM 8.1* 8.1* 7.7* 8.0* 7.8*  MG  --  2.6*  --   --   --   PHOS  --  3.1  --   --   --    GFR: Estimated Creatinine Clearance: 86.4 mL/min (by C-G formula based on SCr of 1.14 mg/dL). Liver Function Tests: Recent Labs  Lab 01/23/19 0354 01/24/19 0606 01/25/19 0418 01/26/19 0225  AST 71* 20 22 21   ALT 6 5 7 6   ALKPHOS 68 71 72 69  BILITOT 0.9 1.0 1.3* 1.5*  PROT 6.2* 6.6 6.4* 6.8  ALBUMIN 2.1* 2.2* 2.1* 2.0*   No results for input(s): LIPASE, AMYLASE in the last 168 hours. No results for input(s): AMMONIA in the last 168 hours. Coagulation Profile: Recent Labs  Lab 01/24/19 0606  INR 1.3*   Cardiac Enzymes: No results for input(s): CKTOTAL, CKMB, CKMBINDEX, TROPONINI in the last 168 hours. BNP (last 3 results) No results for input(s): PROBNP in the last 8760 hours. HbA1C: No results for input(s): HGBA1C in the last 72 hours. CBG: Recent Labs  Lab 01/22/19 1520 01/24/19 1928 01/26/19 1001 01/26/19 1118 01/26/19 1616  GLUCAP 100* 116* 101* 108* 123*   Lipid Profile: No results for input(s): CHOL, HDL, LDLCALC, TRIG, CHOLHDL, LDLDIRECT in the last 72 hours. Thyroid Function Tests: No results for input(s): TSH, T4TOTAL, FREET4, T3FREE, THYROIDAB in the last 72 hours. Anemia Panel: No results for input(s): VITAMINB12, FOLATE, FERRITIN, TIBC, IRON, RETICCTPCT in the last 72 hours. Sepsis Labs: No results for input(s): PROCALCITON, LATICACIDVEN in the last 168 hours.  Recent Results (from the past 240 hour(s))  MRSA PCR Screening     Status: None   Collection Time: 01/22/19  3:22 PM  Result Value Ref Range Status   MRSA by PCR NEGATIVE NEGATIVE Final    Comment:          The GeneXpert MRSA Assay (FDA approved  for NASAL specimens only), is one component of a comprehensive MRSA colonization surveillance program. It is not intended to diagnose MRSA infection nor to guide or monitor treatment for MRSA infections. Performed at [facility], [address] [place], [place] [address]   Culture, blood (routine x 2)     Status: None (Preliminary result)   Collection Time: 01/25/19  6:41 PM  Result Value Ref Range Status   Specimen Description BLOOD RIGHT FOREARM  Final   Special Requests   Final    BOTTLES DRAWN AEROBIC ONLY Blood Culture results may not be optimal due to an inadequate volume of blood received in culture bottles   Culture   Final    NO GROWTH 3 DAYS Performed at [facility], [address] [place], [place] [address]    Report Status PENDING  Incomplete  Culture, blood (routine x 2)     Status: None (Preliminary result)   Collection Time: 01/25/19  7:10 PM  Result Value Ref Range Status   Specimen Description BLOOD RIGHT HAND  Final   Special Requests   Final    BOTTLES DRAWN AEROBIC ONLY Blood Culture adequate volume   Culture   Final    NO GROWTH 3 DAYS Performed at [facility], [address] [place], [place] [address]    Report Status PENDING  Incomplete         Radiology Studies: No results found.      Scheduled Meds: . atorvastatin  80 mg Oral q1800  . furosemide  20 mg Intravenous Q12H  . pantoprazole  40 mg Intravenous Q12H  . sodium chloride flush  10-40 mL Intracatheter Q12H  . sucralfate  1 g Oral Q6H   Continuous Infusions: . sodium chloride 10 mL/hr at 01/27/19 0517  .  ceFAZolin (ANCEF) IV 2 g (01/28/19 0538)     LOS: 15 days    Time spent: 40 minutes    Ramiro Harvestaniel , MD [facility]  If 7PM-7AM, please contact night-coverage [email or web address] 01/28/2019, 11:07 AM

## 2019-01-28 NOTE — Plan of Care (Signed)
Pt remains without signs of skin breakdown.

## 2019-01-28 NOTE — Progress Notes (Signed)
hgb stable at 9.0.   Minimal BM. Creatinine stable at 1.14  Feels ready to advance diet.    RECOMM:  1. Soft diet ordered 2. Not clear if pt needs to go back on ASA---he indicates that his cardiologist was planning to stop it anyway.  Would favor not restarting either ASA or Eliquis for about another 3-5 days, although the timing is not critical and resumption could be started sooner if felt to be clinically important. 3. On standby to do repeat egd prior to resumption of anticoagulation to confirm mucosal healing, IF you wish for an extra margin of safety, but I don't think it's essential. 4. However, would monitor hgb and stools carefully after resumption of anticoagulation. 5. I don't feel routine f/u egd is required, unless you want it prior to resumption of anticoagulation, as discussed above. 6. Gastric biopsies should be ready in 1-2 days--doubt they will change mgt but we will contact pt w/ results. 7. Would recomm current therapy w/ bid PPI and qid sucralfate until dischg; thereafter, after dischg would recomm continuing once-daily PPI indefinitely for ulcer prophylaxis, even if pt doesn't go back on ASA.  I will sign off.  Call if questions or if we can be of further help with this patient.  Florencia Reasons, M.D. Pager [phone] If no answer or after 5 PM call [phone]

## 2019-01-29 LAB — BASIC METABOLIC PANEL
Anion gap: 7 (ref 5–15)
BUN: 13 mg/dL (ref 8–23)
CO2: 20 mmol/L — ABNORMAL LOW (ref 22–32)
Calcium: 7.7 mg/dL — ABNORMAL LOW (ref 8.9–10.3)
Chloride: 108 mmol/L (ref 98–111)
Creatinine, Ser: 1 mg/dL (ref 0.61–1.24)
GFR calc Af Amer: >60 mL/min (ref >60)
GFR calc non Af Amer: >60 mL/min (ref >60)
Glucose, Bld: 113 mg/dL — ABNORMAL HIGH (ref 70–99)
Potassium: 3.3 mmol/L — ABNORMAL LOW (ref 3.5–5.1)
SODIUM: 135 mmol/L (ref 135–145)

## 2019-01-29 LAB — CBC
HCT: 29.1 % — ABNORMAL LOW (ref 39.0–52.0)
Hemoglobin: 9.4 g/dL — ABNORMAL LOW (ref 13.0–17.0)
MCH: 26.7 pg (ref 26.0–34.0)
MCHC: 32.3 g/dL (ref 30.0–36.0)
MCV: 82.7 fL (ref 80.0–100.0)
Platelets: 125 10*3/uL — ABNORMAL LOW (ref 150–400)
RBC: 3.52 MIL/uL — ABNORMAL LOW (ref 4.22–5.81)
RDW: 21.5 % — ABNORMAL HIGH (ref 11.5–15.5)
WBC: 14.4 10*3/uL — ABNORMAL HIGH (ref 4.0–10.5)
nRBC: 0 % (ref 0.0–0.2)

## 2019-01-29 MED ORDER — FUROSEMIDE 10 MG/ML IJ SOLN
20.0000 mg | Freq: Two times a day (BID) | INTRAMUSCULAR | Status: AC
  Administered 2019-01-29 (×2): 20 mg via INTRAVENOUS
  Filled 2019-01-29 (×2): qty 2

## 2019-01-29 MED ORDER — POTASSIUM CHLORIDE CRYS ER 20 MEQ PO TBCR
40.0000 meq | EXTENDED_RELEASE_TABLET | Freq: Once | ORAL | Status: AC
  Administered 2019-01-29: 40 meq via ORAL
  Filled 2019-01-29: qty 2

## 2019-01-29 MED ORDER — SPIRONOLACTONE 12.5 MG HALF TABLET
12.5000 mg | ORAL_TABLET | Freq: Every day | ORAL | Status: DC
  Administered 2019-01-30: 12.5 mg via ORAL
  Filled 2019-01-29: qty 1

## 2019-01-29 NOTE — Progress Notes (Signed)
01/29/2019 PT helped facilitate return to bed and pt's wife is physically assisting in positioning him and rolling him to get lift pad out.  I asked how she was so comfortable with handling patients (I thought maybe she worked in Teacher, music).  She reports she took care of her mother for years and learned from her.  She is certainly proficient in handling him and assisting physically.  PT will continue to follow acutely for safe mobility progression Jamielynn Wigley B. Yarelie Hams, PT, DPT  Acute Rehabilitation [phone] pager [phone] office        01/29/19 1804  PT Visit Information  Last PT Received On 01/29/19  Assistance Needed +2  History of Present Illness Pt is a 68 y/o male admitted 01/13/19 (originally to [facility]) secondary to R UE and R LE pain with sepsis due to streptococcus bacteremia and cellulitis. During admission, pt with persistent R sided weakness. An MRI of the brain revealed multiple acute ischemic infarcts in a pattern that is most consistent with cardioembolic strokes and pt was transferred to [facility] for further care.  Pt's stay further complicated by hematemesis who underwent upper GI endoscopy on 01/21/19 and 2 units of PRBCs due to ABLA.  PMH including but not limited to atrial fibrillation on anticoagulation, CHF, history of subdural hematoma, HLD, HTN, TAVR placement in 9/19 and sleep apnea.  Subjective Data  Patient Stated Goal return home after rehab  Precautions  Precautions Fall  Precaution Comments R sided weakness and R hip/thigh pain  Pain Assessment  Pain Assessment Faces  Faces Pain Scale 8  Pain Location R LE with ROM  Pain Descriptors / Indicators Guarding;Sore  Pain Intervention(s) Limited activity within patient's tolerance;Monitored during session;Repositioned  Cognition  Arousal/Alertness Awake/alert  Behavior During Therapy WFL for tasks assessed/performed  Overall Cognitive Status  (not specifically tested, has to be reminded of  L lean)  Bed Mobility  Overal bed mobility Needs Assistance  Bed Mobility Rolling  Rolling +2 for physical assistance;Max assist  General bed mobility comments Two person max assist to roll bil, he was able to help more rolling toward his right side as he can better pull with his left hand.  His wife provided the second person assist and reports she took care of her mother for years.  She seemed very comfortable in managing him physically.   Transfers  Transfer via Engineer, technical sales transfer comment Assisted in returning pt to bed after being up for >1 hour.   Modified Rankin (Stroke Patients Only)  Pre-Morbid Rankin Score 0  Modified Rankin 5  PT - End of Session  Activity Tolerance Patient limited by pain  Patient left with call bell/phone within reach;with family/visitor present;in bed;with bed alarm set  Nurse Communication Mobility status;Need for lift equipment   PT - Assessment/Plan  PT Plan Current plan remains appropriate  PT Visit Diagnosis Unsteadiness on feet (R26.81);Other abnormalities of gait and mobility (R26.89);Muscle weakness (generalized) (M62.81);Pain  Pain - Right/Left Right  Pain - part of body Hip  PT Frequency (ACUTE ONLY) Min 4X/week  Follow Up Recommendations CIR  PT equipment Wheelchair (measurements PT);Wheelchair cushion (measurements PT);Hospital bed;Other (comment)  AM-PAC PT "6 Clicks" Mobility Outcome Measure (Version 2)  Help needed turning from your back to your side while in a flat bed without using bedrails? 1  Help needed moving from lying on your back to sitting on the side of a flat bed without using bedrails? 1  Help needed moving to  and from a bed to a chair (including a wheelchair)? 1  Help needed standing up from a chair using your arms (e.g., wheelchair or bedside chair)? 1  Help needed to walk in hospital room? 1  Help needed climbing 3-5 steps with a railing?  1  6 Click Score 6  Consider Recommendation of Discharge To:  CIR/SNF/LTACH  PT Goal Progression  Progress towards PT goals Progressing toward goals  PT Time Calculation  PT Start Time (ACUTE ONLY) 1716  PT Stop Time (ACUTE ONLY) 1736  PT Time Calculation (min) (ACUTE ONLY) 20 min  PT General Charges  $$ ACUTE PT VISIT 1 Visit  PT Treatments  $Therapeutic Activity 8-22 mins

## 2019-01-29 NOTE — PMR Pre-admission (Signed)
PMR Admission Coordinator Pre-Admission Assessment  Patient: Joel Rogers is an 68 y.o., male MRN: [id number] DOB: 09/24/1951 Height: _0  (185.4 cm) Weight: 126.4 kg  Insurance Information HMO:  Yes   PPO:      PCP:      IPA:      80/20:      OTHER:  PRIMARY: [employer]      Policy#: [id number]      Subscriber: patient CM Name:       Phone#:      Fax#:  Pre-Cert#: [id number]      Employer:  Benefits:  Phone #: [phone]     Name:  Eff. Date: 11/22/2018     Deduct: $0      Out of Pocket Max: $3600 (met $30)      Life Max: n/a CIR: $295/day for days 1-5, $0/day remaining days      SNF:  100 days Outpatient:  $30/day     Co-Pay:  Home Health: 100%      Co-Pay:  DME: 80%     Co-Pay: 20%  SECONDARY:       Policy#:       Subscriber:  CM Name:       Phone#:      Fax#:  Pre-Cert#:       Employer:  Benefits:  Phone #:      Name:  Eff. Date:      Deduct:       Out of Pocket Max:       Life Max:  CIR:       SNF:  Outpatient:      Co-Pay:  Home Health:       Co-Pay:  DME:      Co-Pay:   Medicaid Application Date:       Case Manager:  Disability Application Date:       Case Worker:   Emergency Contact Information Contact Information    Name Relation Home Work Mobile   [place] Spouse [phone]        Current Medical History  Patient Admitting Diagnosis: Bilateral cerebral/cerebellar infarcts, GI bleed History of Present Illness: Joel Rogers is a 68 year old right-handed male with history of diastolic congestive heart failure, aortic stenosis status post TAVR September 2019, atrial fibrillation maintained on Eliquis,OSA-CPAP, hypertension.  Presented [facility] hospital 01/13/2019 secondary to right and left upper extremity pain with recent fall and right side weakness and findings of sepsis due to Staphylococcus bacteremia and cellulitis. During admission he was maintained on antibiotic therapy. Noted persistent right side weakness. MRI of the brain  revealed multiple acute ischemic infarctions in a pattern most consistent with cardioembolic stroke. Patient was transferred to [facility] for further evaluation. Venous Doppler studies showed no signs of DVT. Findings of elevated liver function studies ultrasound the abdomen showed no focal lesion identified. Follow-up MRI of the brain with numerous scattered foci of acute ischemia throughout the cerebrum and cerebellum. Many of these lesions suggestive of embolic pattern. No midline shift or mass effect. No proximal intracranial arterial occlusion. Echocardiogram with ejection fraction of 70% normal systolic function. TEE completed showing no vegetation on bioprosthetic aortic valve. Patient remained on eliquis for atrial fibrillation with the addition of aspirin for CVA prophylaxis. Patient developed coffee-ground emesis with acute blood loss anemia requiring transfusions totaling 6 units units pack red blood cells and gastroenterology consulted EGD completed 2 with findings of red blood in the lower third of the esophagus clotted  blood in the entire stomach. Blood thinners were placed on hold patient remained NPO with follow-up upper GI endoscopy again completed 01/26/2019 Dr Cristina Gong showing no active bleeding or blood present and appeared to show some resolving ischemic changes in the region of the gastric pouch and the anastomosis. His diet was slowly advanced. Patient's aspirin and Eliquis currently remains on hold and await plan to resume and possibly 3-5 days. It was recommended to continue Protonix twice daily.  Hospital course AKI with creatinine baseline 1.1-1.23 elevated to 1.95 with renal services consulted 01/23/2019. AKI likely secondary to ischemic ATN in the setting of sepsis. Maintain on gentle IV fluids and creatinine improved 1.00. Patient is completing a course of Ancef for sepsis. Therapy evaluations have been completed with recommendations of physical medicine rehabilitation  consult. Patient was admitted for a comprehensive rehabilitation program.  Patient's medical record from [facility] has been reviewed by the rehabilitation admission coordinator and physician.   Past Medical History  Past Medical History:  Diagnosis Date  . Anemia    low iron  . Arthritis    bilateral knees  . Atrial fibrillation, chronic   . Chronic diastolic CHF (congestive heart failure) ([place]) 06/15/2018  . History of subdural hematoma   . Hyperlipidemia   . Hypertension   . Morbid obesity ([place])   . Normal coronary arteries    by cardiac catheterization performed 03/14/06  . Pre-diabetes    pt denies  . S/P TAVR (transcatheter aortic valve replacement)    a. 07/25/18: Edwards Sapien 3 THV (size 26 mm, model # 9600TFX, serial # [id number]) by Dr. Cyndia Bent and Dr. Angelena Form  . Sleep apnea    on CPAP  . Venous insufficiency     Family History   family history includes Alzheimer's disease in his maternal grandfather and mother; Breast cancer in his sister; Cancer in his brother and father; Hypertension in his father.  Prior Rehab/Hospitalizations Has the patient had major surgery during 100 days prior to admission? No    Current Medications  Current Facility-Administered Medications:  .  0.9 %  sodium chloride infusion, , Intravenous, PRN, Buccini, Robert, MD, Last Rate: 10 mL/hr at 01/27/19 0517 .  acetaminophen (TYLENOL) tablet 650 mg, 650 mg, Oral, Q6H PRN, Buccini, Robert, MD .  atorvastatin (LIPITOR) tablet 80 mg, 80 mg, Oral, q1800, Buccini, Robert, MD, 80 mg at 01/28/19 1848 .  ceFAZolin (ANCEF) IVPB 2g/100 mL premix, 2 g, Intravenous, Q8H, Karren Cobble, RPH, Last Rate: 200 mL/hr at 01/29/19 0508, 2 g at 01/29/19 0508 .  furosemide (LASIX) injection 20 mg, 20 mg, Intravenous, Q12H, Eugenie Filler, MD, 20 mg at 01/29/19 1032 .  metoprolol tartrate (LOPRESSOR) injection 5 mg, 5 mg, Intravenous, Q2H PRN, Buccini, Robert, MD .  ondansetron (ZOFRAN) tablet 4  mg, 4 mg, Oral, Q6H PRN **OR** ondansetron (ZOFRAN) injection 4 mg, 4 mg, Intravenous, Q6H PRN, Buccini, Robert, MD, 4 mg at 01/24/19 0446 .  oxyCODONE (Oxy IR/ROXICODONE) immediate release tablet 5-10 mg, 5-10 mg, Oral, Q4H PRN, Buccini, Robert, MD .  pantoprazole (PROTONIX) injection 40 mg, 40 mg, Intravenous, Q12H, Buccini, Robert, MD, 40 mg at 01/29/19 1032 .  polyethylene glycol (MIRALAX / GLYCOLAX) packet 17 g, 17 g, Oral, Daily PRN, Buccini, Robert, MD .  sodium chloride flush (NS) 0.9 % injection 10-40 mL, 10-40 mL, Intracatheter, Q12H, Buccini, Robert, MD, 10 mL at 01/29/19 1032 .  sodium chloride flush (NS) 0.9 % injection 10-40 mL, 10-40 mL, Intracatheter, PRN, Buccini, Robert,  MD .  Derrill Memo ON 01/30/2019] spironolactone (ALDACTONE) tablet 12.5 mg, 12.5 mg, Oral, Daily, Eugenie Filler, MD .  sucralfate (CARAFATE) 1 GM/10ML suspension 1 g, 1 g, Oral, Q6H, Eugenie Filler, MD, 1 g at 01/29/19 0509  Patients Current Diet:   Diet Order            DIET SOFT Room service appropriate? 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 Self Care: Did the patient need help bathing, dressing, using the toilet or eating?  Independent  Indoor Mobility: Did the patient need assistance with walking from room to room (with or without device)? Independent  Stairs: Did the patient need  assistance with internal or external stairs (with or without device)? Independent  Functional Cognition: Did the patient need help planning regular tasks such as shopping or remembering to take medications? Independent  Home Assistive Devices / Equipment Home Assistive Devices/Equipment: Wellsite geologist, IT trainer scooter Home Equipment: Environmental consultant - 2 wheels, Cane - single point  Prior Device Use: Indicate devices/aids used by the patient prior to current illness, exacerbation or injury? None of the above  Prior Functional Level Vocation: Retired Comments: Hydrographic surveyor, drives, was actively going to [employer] and using the pool there   Prior Functional Level Current Functional Level  Bed Mobility  Independent  Max assist  Transfers  Independent  Mod assist  Mobility - Walk/Wheelchair  ambulatory     Mobility - Ambulation/Gait  Indpenendent  not attempted  Upper Body Dressing  Independent  Min/mod assist  Lower Body Dressing  Independent +2 for physical assistance, +2 for safety/equipment, Sitting/lateral leans, Maximal assistance, Total assistance  Grooming  Independent Min guard, Set up, Bed level, Wash/dry face, Sitting  Eating/Drinking  Independent Set up, Bed level  Toilet Transfer  Independent  Max assist  Bladder Continence  Continent  Incontinent    Bowel Management   Continent  Incontinent, last BM 01/26/2019  Stair Climbing  Independent  Not attempted  Communication No difficulties No difficulties  Memory  No difficulties  No apparent difficulties  Cooking/Meal Prep   No difficulties     Housework    No difficulties   Money Management   No difficulties   Driving   No difficulties     Special needs/care consideration BiPAP/CPAP no CPM no  Continuous Drip IV no Dialysis no        Days  Life Vest no Oxygen no Special Bed no Trach Size no Wound Vac (area) no      Location  Skin intact                              Location  Bowel mgmt: last bm 01/26/2019,  incontinent Bladder mgmt: incontinent, condom cath  Diabetic mgmt no  Previous Home Environment Living Arrangements: Spouse/significant other  Lives With: Spouse  Available Help at Discharge: Family, Available 24 hours/day Type of Home: House Home Layout: One level Home Access: Stairs to enter Entrance Stairs-Rails: Right Entrance Stairs-Number of Steps: 3 Bathroom Shower/Tub: Tub/shower unit, Multimedia programmer: Handicapped height Bathroom Accessibility: Yes How Accessible: Accessible via wheelchair Sullivan: No  Discharge Living Setting Plans for Discharge Living Setting: Patient's home Type of Home at Discharge: House Discharge Home Layout: One level Discharge Home Access: Stairs to enter Entrance Stairs-Rails: Left Entrance Stairs-Number of Steps: 2-3 Discharge Bathroom Shower/Tub: Tub/shower unit, Walk-in shower Discharge Bathroom Toilet: Handicapped height Discharge Bathroom Accessibility: Yes How Accessible: Accessible via wheelchair Does the patient have any problems obtaining your medications?: No  Social/Family/Support Systems Patient Roles: Spouse, Parent, 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) Anticipated Caregiver: wife, Kennyth Lose Anticipated Ambulance person Information: [phone] (cell), [phone] (home) Caregiver Availability: 24/7 Discharge Plan Discussed with Primary Caregiver: Yes Is Caregiver In Agreement with Plan?: Yes Does Caregiver/Family have Issues with Lodging/Transportation while Pt is in Rehab?: No  Goals/Additional Needs Patient/Family Goal for Rehab: PT/OT mod I Expected length of stay: 10-12 days 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 I met with patient at the bedside for inpatient rehabilitation assessment.  Patient will benefit from ongoing PT, and OT, can actively participate in 3 hours of therapy a day 5 days of the week, and can make measurable gains during the admission.  Patient will also benefit from the coordinated team approach during an Inpatient Acute Rehabilitation admission.  The patient will receive intensive therapy as well as Rehabilitation physician, nursing, social worker, and care management interventions.  Due to bowel management, bladder management, safety, skin/wound care, disease management, medical administration, pain management, and patient education the patient requires 24 hour a day rehabilitation nursing.  The patient is currently mod to max assist with mobility and basic ADLs.  Discharge setting and therapy post discharge home with home health anticipated.  Patient has agreed to participate in the Acute Inpatient Rehabilitation Program and will admit today.  Preadmission Screen Completed By:  Michel Santee, 01/29/2019 1:32 PM ______________________________________________________________________   Discussed status with Dr. Posey Pronto on 01/29/19  at 1:44 PM  and received telephone approval for admission today.  Admission Coordinator:  Michel Santee, time 1:44 PM Sudie Grumbling 01/29/19    Assessment/Plan: 1. Diagnosis: bi-cerebral/cerebellar infarcts 2. Does the need for close, 24 hr/day  Medical supervision in concert with the patient's rehab needs make it unreasonable for this patient to be served in a less intensive setting? Yes 3. Co-Morbidities requiring supervision/potential complications: dchf, AS, htn, osa 4. Due to bladder management, bowel management, safety, skin/wound care, disease management, medication administration, pain management and patient education, does the patient require 24 hr/day rehab nursing? Yes 5. Does the  patient require coordinated care of a physician, rehab nurse, PT (1-2 hrs/day, 5 days/week) and OT (1-2 hrs/day, 5 days/week) to address physical and functional deficits in the context of the above medical diagnosis(es)? Yes Addressing deficits in the following areas: balance, endurance, locomotion, strength, transferring, bowel/bladder control, bathing, dressing, feeding, grooming, toileting and psychosocial support 6. Can the patient actively participate in an intensive therapy program of at least 3 hrs of therapy 5 days a week?  Yes 7. The potential for patient to make measurable gains while on inpatient rehab is excellent 8. Anticipated functional outcomes upon discharge from inpatients are: modified independent PT, modified independent OT, n/a SLP 9. Estimated rehab length of stay to reach the above functional goals is: 10-12 days 10. Anticipated D/C setting: Home 11. Anticipated post D/C treatments: [place] therapy 12. Overall Rehab/Functional Prognosis: excellent  Meredith Staggers, MD, [facility] 01/30/2019   Michel Santee 01/29/2019

## 2019-01-29 NOTE — Progress Notes (Signed)
PROGRESS NOTE    Joel Rogers  ZOX:[id number] DOB: July 22, 1951 DOA: 01/13/2019 PCP: [name], MD    Brief Narrative:  68 y/o admitted to [facility] w/ sepsis and Streptococcal bacteremia due to R LE cellulitis. He was treated with IV antibiotics and improved. Due to his hx of bioprosthetic aortic valve, TEE was performed, and did not show any vegetations. Since admission he was noted to have persistent right-sided weakness. An MRI brain that showed bilateral CVAs and small subarachnoid hemorrhage.He was transferred to [facility] 2/29 for a Neurology evaluation.  Repeat CT head on February 29 showedprior infarcts and Stable small volume of subarachnoid hemorrhage within the left central sulcus.  On 3/1 he developed coffee-ground emesis w/ acute blood loss anemia. At Endoscopy 01/21/2019, and again on 3/2 he was found to have a significant amount of blood/blood clot in the stomach and distal esophagus, such that an intervention could not be performed.  He has remained off anticoagulation and off antiplatelet agents.  He has received total of 6 units PRBC since this admission.  Patient placed on octreotide drip while here and GI following.  Octreotide drip has been discontinued.  Patient underwent upper endoscopy today 01/26/2019 with findings concerning for resolving ischemic changes in the region of the gastric pouch and anastomosis.,  No active bleeding noted.   Assessment & Plan:   Principal Problem:   UGIB (upper gastrointestinal bleed) Active Problems:   Acute CVA (cerebrovascular accident) (HCC)   Bacteremia due to Streptococcus   Obstructive sleep apnea   Chronic atrial fibrillation   HTN (hypertension)   Hyperlipemia   Status post gastric bypass for obesity   Vitamin D deficiency   History of Severe aortic stenosis   Varicose veins of right lower extremity with complications   Pulmonary hypertension, unspecified (HCC)   History of subdural hematoma   S/P TAVR (transcatheter aortic valve  replacement)   Sepsis (HCC)   Acute renal failure (HCC)   Right shoulder pain   Cellulitis   Upper GI bleed  1 upper GI bleed Patient status post EGD x3.  Likely secondary to ischemic changes in the region of the gastric pouch and anastomosis which was characterized by necrotic, sloughing tissue as well as some mucosal erythema and erosions noted on upper endoscopy of 01/26/2019.  Per GI upper endoscopy done 01/26/2019 with no active bleeding noted or blood in the GI tract.  Patient was on octreotide drip which has subsequently been discontinued.  Patient status post 6 units packed red blood cells during this hospitalization.  Hemoglobin currently stable at 9.4.  Patient started on clear liquids per GI which he tolerated and has been advanced to full liquid diet which he tolerated as well.  Patient has been advanced to a soft diet which he seems to be tolerating. Continue Protonix 40 mg IV every 12 hours and sucralfate per GI recommendations.Continue PPI twice daily and 4 times daily sucralfate until discharge and then subsequently once daily PPI indefinitely for ulcer prophylaxis per GI recommendations. Appreciate GI input and recommendations.  2.  Right lower extremity cellulitis with severe sepsis/Streptococcus bacteremia Present on admission at any pain.  Patient underwent a TEE which was negative for vegetations on bioprosthetic aortic valve.  PICC line has been placed.  2 weeks of IV antibiotics have been planned patient currently on s/p IV Zosyn. Last blood culture from February 22 grew 1 bottle of pansensitive group C streptococcus.  Patient currently afebrile.  Repeat blood cultures from 01/25/2019 with no  growth to date.  Patient with a leukocytosis which is fluctuating.  Discontinued IV Zosyn and placed on IV Ancef to complete 2-week course of antibiotic treatment.  Antibiotic day #13/14. Follow for now.  3.  Acute embolic CVA Patient with some residual right-sided weakness and dysarthria as well  as some probable dysphagia.  MRI MRA head with numerous scattered foci of acute ischemia throughout the cerebrum and cerebellum suggestive of embolic infarcts.  Others noted in the deep watershed distribution as seen in the setting of hypoperfusion events.  Also noted was concern for subarachnoid blood.  Doppler ultrasounds negative for DVT.  2D echo/TEE negative for any vegetations and no source of emboli noted.  Patient with carotid Dopplers on 07/10/2018 and as such those were not repeated.  Patient currently not a candidate for aspirin or Eliquis given ongoing upper GI bleed/issues.  Patient understands the risk of bleeding the risk of stroke with and without anticoagulation.  Patient has been seen in consultation by neurology initially during the hospitalization.  Continue Lipitor.  Patient being followed by GI who recommend holding anticoagulation for at least 4 more days. Could likely resume anticoagulation per GI in approximately 4 days.  PT/OT.  Likely needs inpatient rehab.  Follow.  4. ?? aspiration pneumonia Patient noted to have coughing episodes while eating.  Chest x-ray with left lung base opacity likely concern for aspiration.  Speech therapy evaluation pending.  Patient currently on clear liquids.  Continue aspiration precautions.  Patient was transitioned to Zosyn on January 21, 2019 to complete a 5-day course to cover for aspiration.  Follow.  5.  Acute blood loss anemia Secondary to problem #1.  Status post 6 units packed red blood cells.  H&H stable at 9.4.  Follow.  6.  Acute kidney injury on chronic kidney disease stage III Ackley secondary to ischemic ATN in the setting of sepsis, hemodynamic compromise due to severe anemia and GI bleed.  Renal ultrasound with no acute abnormalities noted.   Creatinine peaked at 1.9 during the hospital course seems to be trending down.  Creatinine currently at 1.14.  Patient given a dose of Lasix 20 mg IV x1 due to volume overload from aggressive fluid  resuscitation and transfusion of packed red blood cells.  Patient placed on Lasix 20 mg IV every 12 hours.  Monitor renal function closely.  Nephrology following.  Follow.  7.  Metabolic acidosis Likely secondary to problem #6.  Bicarb was discontinued per nephrology.  Bicarbonate 20.  Follow for now.  If continued decreasing bicarb will place on bicarb tablets.  Nephrology following.  8.  Thrombocytopenia Currently off antiplatelet agents.  Platelet count was improving however seems to be slowly trending back down.  Follow for now.   9.  Chronic atrial fibrillation Currently rate controlled.  Patient was previously on Eliquis which was held secondary to thrombocytopenia as well as acute GI bleed now.  Rate controlled.  Cardizem on hold.  Per GI continue to hold anticoagulation for several days up to about 4 more days.  Follow.  10.  Small subarachnoid hemorrhage Noted on CT head 01/20/2019.  May repeat head CT if mental status worsens and also prior to initiation of anticoagulation.  11.  Hypertension BP noted to be 149/86 this morning.  We will give a dose of Lasix 20 mg IV every 12 hours.  Resume home regimen of spironolactone tomorrow.  Cardizem on hold.  Follow.   12.  Obstructive sleep apnea CPAP nightly.  13.  Volume overload Patient with lower extremity edema.  Likely secondary to aggressive fluid resuscitation during this hospitalization as well as transfusion of packed red blood cells.  Patient denies any shortness of breath.  Patient with a urine output of 2.675 L over the past 24 hours after Lasix 20 mg IV every 12 hours. Patient noted during the hospitalization to be in acute renal failure.  Renal function has been trending down.  Patient's current weight of 126.4 kg from 126.3 kg from 110.7 kg on admission.  Lasix 20 mg IV every 12 hours x2 doses.  Monitor urine output and renal function.  Follow.   DVT prophylaxis: SCDs due to recent GI bleed. Code Status: Full Family  Communication: Updated patient and wife at bedside. Disposition Plan: Likely CIR when medically stable and when bed available.   Consultants:   Inpatient rehab: Dr.Swartz 01/25/2019  Nephrology: Dr. Ronalee Belts 01/23/2019  PCCM Dr. Molli Knock 01/22/2019  Interventional radiology: Dr. Fredia Sorrow 01/22/2019  General surgery: Dr.Tsuei 01/22/2019  Gastroenterology: Dr. Ewing Schlein 01/21/2019  Neurology: Dr. Otelia Limes 01/20/2019  Procedures:   CT head 01/20/2019  Chest x-ray 01/13/2019, 01/15/2019, 01/21/2019  MRI MRA head 01/19/2019  MRI C-spine 01/19/2019  Renal ultrasound 01/24/2019  Right lower extremity Dopplers 01/14/2019- negative for DVT  Right upper extremity Dopplers 01/18/2019--- negative for DVT  Plain films of the right hip and pelvis 01/16/2019  Right upper quadrant ultrasound 01/18/2019  TEE 01/18/2019-  2D echo 01/15/2019  Upper endoscopy 01/21/2019, 01/22/2019, 01/26/2019  Status post 6 units packed red blood cells.  Significant Diagnostic Tests:  2/27 TEE >> negative for vegetations  2/28  MRI / MRA Head >> numerous scattered foci of acute ischemia throughout the cerebrum, cerebellum, many of these lesions are most suggestive of embolic infarcts / others are in a deep watershed distribution, no proximal intracranial arterial occlusion, moderate narrowing of the M2 segments of both middle cerebral arteries  3/01 EGD >> clotted blood in the lower third of the esophagus and in the middle third of the esophagus. Clotted blood in the entire stomach. 3/02 EGD >> Red blood in the lower third of the esophagus. Clotted blood in the entire stomach.  Micro Data:  UC 2/22 >> 10k insignificant growth  BCx2 2/22 >> Group C strep >> pan sensitive  BCx2 2/24 >> negative    Antimicrobials:   IV Zosyn 01/21/2019>>> 01/27/2019 Cefepime 2/22 > 2/24 Vancomycin 2/22 > 2/25 Metronidazole 2/22 > 2/13 Ancef 2/25 >3/1 IV Ancef 01/27/2019  Subjective: Patient in bed.  States had good urine output.  Denies any chest  pain or shortness of breath.  No melena.  No hematemesis.  Tolerating soft diet.  Motivated to work with physical therapy and to go to inpatient rehab if he gets in bed.  Objective: Vitals:   01/28/19 1607 01/28/19 2300 01/29/19 0512 01/29/19 0737  BP: 131/83 126/67  (!) 149/86  Pulse: 80 70  64  Resp: 16     Temp: 98.7 F (37.1 C) 98.6 F (37 C)  98.8 F (37.1 C)  TempSrc: Oral Oral  Oral  SpO2: 99% 100%  100%  Weight:   126.4 kg   Height:        Intake/Output Summary (Last 24 hours) at 01/29/2019 1117 Last data filed at 01/29/2019 0512 Gross per 24 hour  Intake -  Output 2675 ml  Net -2675 ml   Filed Weights   01/22/19 0802 01/27/19 1109 01/29/19 0512  Weight: 128.1 kg 126.3 kg 126.4 kg    Examination:  General exam: NAD. Respiratory system: Lungs clear to auscultation bilaterally.  No wheezes, no crackles, no rhonchi.  No use of accessory muscles of respiration. Cardiovascular system: Regular rate and rhythm no murmurs rubs or gallops.  No JVD.  2+ bilateral lower extremity edema up to thighs. Gastrointestinal system: Abdomen is soft, obese, nondistended, positive bowel sounds, nontender to palpation.  No rebound.  No guarding. Central nervous system: Alert and oriented.  Dysarthric speech.  Right upper extremity weakness 4/5, 2-3/5 right lower extremity weakness.  Mild right facial droop.  No focal neurological deficits. Extremities: 5/5 left upper and left lower extremity strength.  4/5 right upper extremity strength.2-3/5 right lower extremity strength. Skin: No rashes, lesions or ulcers Psychiatry: Judgement and insight appear normal. Mood & affect appropriate.     Data Reviewed: I have personally reviewed following labs and imaging studies  CBC: Recent Labs  Lab 01/25/19 0418 01/26/19 0225 01/27/19 0512 01/28/19 0530 01/29/19 0503  WBC 16.8* 16.8* 14.1* 13.8* 14.4*  NEUTROABS 15.3*  --   --   --   --   HGB 8.8* 8.6* 8.9* 9.0* 9.4*  HCT 27.2* 27.6* 28.5*  27.8* 29.1*  MCV 82.9 84.7 83.8 83.2 82.7  PLT 145* 148* 143* 136* 125*   Basic Metabolic Panel: Recent Labs  Lab 01/25/19 0418 01/26/19 0225 01/27/19 0512 01/28/19 0530 01/29/19 0503  NA 147* 145 141 137 135  K 3.8 3.6 3.7 3.5 3.3*  CL 118* 117* 117* 112* 108  CO2 21* 18* 21* 20* 20*  GLUCOSE 116* 108* 110* 118* 113*  BUN 44* 37* 25* 19 13  CREATININE 1.50* 1.35* 1.18 1.14 1.00  CALCIUM 8.1* 7.7* 8.0* 7.8* 7.7*  MG 2.6*  --   --   --   --   PHOS 3.1  --   --   --   --    GFR: Estimated Creatinine Clearance: 98.5 mL/min (by C-G formula based on SCr of 1 mg/dL). Liver Function Tests: Recent Labs  Lab 01/23/19 0354 01/24/19 0606 01/25/19 0418 01/26/19 0225  AST 71* 20 22 21   ALT 6 5 7 6   ALKPHOS 68 71 72 69  BILITOT 0.9 1.0 1.3* 1.5*  PROT 6.2* 6.6 6.4* 6.8  ALBUMIN 2.1* 2.2* 2.1* 2.0*   No results for input(s): LIPASE, AMYLASE in the last 168 hours. No results for input(s): AMMONIA in the last 168 hours. Coagulation Profile: Recent Labs  Lab 01/24/19 0606  INR 1.3*   Cardiac Enzymes: No results for input(s): CKTOTAL, CKMB, CKMBINDEX, TROPONINI in the last 168 hours. BNP (last 3 results) No results for input(s): PROBNP in the last 8760 hours. HbA1C: No results for input(s): HGBA1C in the last 72 hours. CBG: Recent Labs  Lab 01/22/19 1520 01/24/19 1928 01/26/19 1001 01/26/19 1118 01/26/19 1616  GLUCAP 100* 116* 101* 108* 123*   Lipid Profile: No results for input(s): CHOL, HDL, LDLCALC, TRIG, CHOLHDL, LDLDIRECT in the last 72 hours. Thyroid Function Tests: No results for input(s): TSH, T4TOTAL, FREET4, T3FREE, THYROIDAB in the last 72 hours. Anemia Panel: No results for input(s): VITAMINB12, FOLATE, FERRITIN, TIBC, IRON, RETICCTPCT in the last 72 hours. Sepsis Labs: No results for input(s): PROCALCITON, LATICACIDVEN in the last 168 hours.  Recent Results (from the past 240 hour(s))  MRSA PCR Screening     Status: None   Collection Time: 01/22/19   3:22 PM  Result Value Ref Range Status   MRSA by PCR NEGATIVE NEGATIVE Final    Comment:  The GeneXpert MRSA Assay (FDA approved for NASAL specimens only), is one component of a comprehensive MRSA colonization surveillance program. It is not intended to diagnose MRSA infection nor to guide or monitor treatment for MRSA infections. Performed at [facility], [address], [place], [name] [address]   Culture, blood (routine x 2)     Status: None (Preliminary result)   Collection Time: 01/25/19  6:41 PM  Result Value Ref Range Status   Specimen Description BLOOD RIGHT FOREARM  Final   Special Requests   Final    BOTTLES DRAWN AEROBIC ONLY Blood Culture results may not be optimal due to an inadequate volume of blood received in culture bottles   Culture   Final    NO GROWTH 4 DAYS Performed at [facility], [address], [place], [name] [address]    Report Status PENDING  Incomplete  Culture, blood (routine x 2)     Status: None (Preliminary result)   Collection Time: 01/25/19  7:10 PM  Result Value Ref Range Status   Specimen Description BLOOD RIGHT HAND  Final   Special Requests   Final    BOTTLES DRAWN AEROBIC ONLY Blood Culture adequate volume   Culture   Final    NO GROWTH 4 DAYS Performed at [facility], [address], [place], [name] [address]    Report Status PENDING  Incomplete         Radiology Studies: No results found.      Scheduled Meds: . atorvastatin  80 mg Oral q1800  . furosemide  20 mg Intravenous Q12H  . pantoprazole  40 mg Intravenous Q12H  . sodium chloride flush  10-40 mL Intracatheter Q12H  . [START ON 01/30/2019] spironolactone  12.5 mg Oral Daily  . sucralfate  1 g Oral Q6H   Continuous Infusions: . sodium chloride 10 mL/hr at 01/27/19 0517  .  ceFAZolin (ANCEF) IV 2 g (01/29/19 0508)     LOS: 16 days    Time spent: 40 minutes    Ramiro Harvest, MD [facility]  If 7PM-7AM, please  contact night-coverage [email or web address] 01/29/2019, 11:17 AM

## 2019-01-29 NOTE — Progress Notes (Signed)
IP rehab admissions - I met with patient and his wife at the bedside.  They are interested in CIR.  I opened the case requesting inpatient rehab admission this past Friday.  Awaiting call back from insurance case manager.  I will follow up once I hear back from insurance carrier.  Call me for questions.  [phone]

## 2019-01-29 NOTE — Progress Notes (Signed)
Pathologist called me today and indicated that pt's gastric bx's confirm ischemic gastritis with ulceration, but he also noted Kayexalate crystals in some of the biopsies, and he indicates that Kayexalate is reported to be able to cause ischemic gastritis.    I checked the patient's record, and he did indeed receive oral Kayexalate on 01/22/2019, which was about 5 days before his UGIB.  I have contacted Dr. Ramiro Harvest to let him know of this, so Kayexalate can be listed as an allergy for this patient.  Florencia Reasons, M.D. Pager [phone] If no answer or after 5 PM call [phone]

## 2019-01-29 NOTE — Plan of Care (Signed)
  Problem: Health Behavior/Discharge Planning: Goal: Ability to manage health-related needs will improve Outcome: Progressing   Problem: Clinical Measurements: Goal: Respiratory complications will improve Outcome: Progressing   Problem: Nutrition: Goal: Adequate nutrition will be maintained Outcome: Progressing   Problem: Elimination: Goal: Will not experience complications related to urinary retention Outcome: Progressing

## 2019-01-29 NOTE — Progress Notes (Signed)
Physical Therapy Treatment Patient Details Name: Joel Rogers MRN: [id number] DOB: 1951-01-17 Today's Date: 01/29/2019    History of Present Illness Pt is a 68 y/o male admitted 01/13/19 (originally to [facility]) secondary to R UE and R LE pain with sepsis due to streptococcus bacteremia and cellulitis. During admission, pt with persistent R sided weakness. An MRI of the brain revealed multiple acute ischemic infarcts in a pattern that is most consistent with cardioembolic strokes and pt was transferred to [facility] for further care.  Pt's stay further complicated by hematemesis who underwent upper GI endoscopy on 01/21/19 and 2 units of PRBCs due to ABLA.  PMH including but not limited to atrial fibrillation on anticoagulation, CHF, history of subdural hematoma, HLD, HTN, TAVR placement in 9/19 and sleep apnea.    PT Comments    Goals re-assessed.  Pt was able to sit EOB longer and maintain his self closer to midline more than the last session.  He could be close supervision for 2-3 mins before fatigueing and needing min assist to stay sitting with one or bil UE prop.  I educated him re: self massage for his right hand edema, and encouraged ankle pumps and glute sets while he is up in the chair for circulation.  We attempted standing today with the regular steady standing frame, however, he was too large to fit into it.  Therapy will attempt to locate a bariatric steady stander to attempt standing with support next session.    Follow Up Recommendations  CIR     Equipment Recommendations  Wheelchair (measurements PT);Wheelchair cushion (measurements PT);Hospital bed;Other (comment)    Recommendations for Other Services   NA     Precautions / Restrictions Precautions Precautions: Fall Precaution Comments: R sided weakness and R hip/thigh pain    Mobility  Bed Mobility Overal bed mobility: Needs Assistance Bed Mobility: Supine to Sit;Sit to Supine     Supine to sit: +2 for  physical assistance;HOB elevated;Max assist Sit to supine: +2 for physical assistance;Max assist   General bed mobility comments: Two person max assist to come to EOB, mostly to help with hips and trunk as well as right leg.  Assist needed at both legs and hips and trunk to get back into bed.   Transfers                 General transfer comment: Attempted squat at EOB with regular steady (the only steady available on this unit), however, pt was unable to fit into the regular steady to even attempt boosting his hips up off of the bed, so, we lifted him into the chair via the maxi move total lift.       Modified Rankin (Stroke Patients Only) Modified Rankin (Stroke Patients Only) Pre-Morbid Rankin Score: No symptoms Modified Rankin: Severe disability     Balance Overall balance assessment: Needs assistance Sitting-balance support: Feet supported;Bilateral upper extremity supported;Single extremity supported Sitting balance-Leahy Scale: Poor Sitting balance - Comments: Pt fatigues quickly and needs external assist to maintain midline posture.  He has a left lateral preference.  He was able to stay up longer and support himself in sitting more today as we were EOB >15 mins.   Postural control: Left lateral lean                                  Cognition Arousal/Alertness: Awake/alert Behavior During Therapy: Joel Rogers for tasks  assessed/performed Overall Cognitive Status: (not specifically tested, has to be reminded of L lean)                                        Exercises General Exercises - Lower Extremity Ankle Circles/Pumps: AROM;Both;10 reps Gluteal Sets: AROM;Both;10 reps Long Arc Quad: AROM;AAROM;Left;Right;5 reps        Pertinent Vitals/Pain Pain Assessment: Faces Faces Pain Scale: Hurts whole lot Pain Location: R LE with ROM Pain Descriptors / Indicators: Guarding;Sore Pain Intervention(s): Limited activity within patient's  tolerance;Monitored during session;Repositioned       Prior Function Level of Independence: Independent          PT Goals (current goals can now be found in the care plan section) Acute Rehab PT Goals Patient Stated Goal: return home after rehab PT Goal Formulation: With patient/family Time For Goal Achievement: 02/12/19 Potential to Achieve Goals: Fair Progress towards PT goals: Progressing toward goals    Frequency    Min 4X/week      PT Plan Current plan remains appropriate       AM-PAC PT "6 Clicks" Mobility   Outcome Measure  Help needed turning from your back to your side while in a flat bed without using bedrails?: Total Help needed moving from lying on your back to sitting on the side of a flat bed without using bedrails?: Total Help needed moving to and from a bed to a chair (including a wheelchair)?: Total Help needed standing up from a chair using your arms (e.g., wheelchair or bedside chair)?: Total Help needed to walk in hospital room?: Total Help needed climbing 3-5 steps with a railing? : Total 6 Click Score: 6    End of Session   Activity Tolerance: Patient limited by pain Patient left: in chair;with call bell/phone within reach;with chair alarm set;with family/visitor present Nurse Communication: Mobility status;Need for lift equipment PT Visit Diagnosis: Unsteadiness on feet (R26.81);Other abnormalities of gait and mobility (R26.89);Muscle weakness (generalized) (M62.81);Pain Pain - Right/Left: Right Pain - part of body: Hip     Time: 7564-3329 PT Time Calculation (min) (ACUTE ONLY): 51 min  Charges:  $Therapeutic Activity: 38-52 mins                    Nathan Moctezuma B. Elira Colasanti, PT, DPT  Acute Rehabilitation [phone] pager #[phone] office   01/29/2019, 4:35 PM

## 2019-01-29 NOTE — H&P (Signed)
Physical Medicine and Rehabilitation Admission H&P    Chief Complaint  Patient presents with  . Shoulder Pain   Chief complaint:  weakness  HPI: Joel Rogers is a 68 year old right-handed male with history of diastolic congestive heart failure, aortic stenosis status post TAVR September 2019, atrial fibrillation maintained on Eliquis,OSA-CPAP, hypertension. Per chart review lives with spouse. Community ambulator active and driving. One level home with 3 steps to entry.Presented [address] [facility] hospital 01/13/2019 secondary to right and left upper extremity pain with recent fall and right side weakness and findings of sepsis due to Staphylococcus bacteremia and cellulitis.during admission maintain on antibiotic therapy. Noted persistent right side weakness. MRI of the brain revealed multiple acute ischemic infarctions in a pattern most consistent with cardioembolic stroke. Patient was transferred to [facility] for further evaluation. Venous Doppler studies showed no signs of DVT. Findings of elevated liver function studies ultrasound the abdomen showed no focal lesion identified. Follow-up MRI of the brain with numerous scattered foci of acute ischemia throughout the cerebrum and cerebellum. Many of these lesions suggestive of embolic pattern. No midline shift or mass effect. No proximal intracranial arterial occlusion. Echocardiogram with ejection fraction of 60% normal systolic function. TEE completed showing no vegetation on bioprosthetic aortic valve. Patient remained on eliquis for atrial fibrillation with the addition of aspirin for CVA prophylaxis. Patient developed coffee-ground emesis with acute blood loss anemia requiring transfusions totaling 6 units units pack red blood cells and gastroenterology consulted EGD completed 2 with findings of red blood in the lower third of the esophagus clotted blood in the entire stomach. Blood thinners were placed on hold patient remained NPO with  follow-up upper GI endoscopy again completed 01/26/2019 Dr Matthias Hughs showing no active bleeding or blood present and appeared to show some resolving ischemic changes in the region of the gastric pouch and the anastomosis. His diet was slowly advanced. Patient's aspirin and Eliquis currently remains on hold and await plan to resume and possibly 3-5 days. It was recommended to continue Protonix twice daily.  Hospital course AKI with creatinine baseline 1.1-1.23 elevated to 1.95 with renal services consulted 01/23/2019. AKI likely secondary to ischemic ATN in the setting of sepsis. Maintain on gentle IV fluids and creatinine improved 1.00. Patient is completing a course of Ancef for sepsis. Therapy evaluations have been completed with recommendations of physical medicine rehabilitation consult. Patient was admitted for a comprehensive rehabilitation program.  Review of Systems  Constitutional: Positive for fever. Negative for chills.  HENT: Negative for hearing loss.   Eyes: Positive for double vision. Negative for blurred vision.  Respiratory: Positive for shortness of breath. Negative for cough.   Cardiovascular: Positive for palpitations and leg swelling.  Gastrointestinal: Positive for abdominal pain, constipation and nausea.  Genitourinary: Positive for urgency. Negative for dysuria, flank pain and hematuria.  Musculoskeletal: Positive for falls, joint pain and myalgias.  Skin: Negative for rash.  Neurological: Positive for dizziness, weakness and headaches. Negative for seizures.  Psychiatric/Behavioral: The patient has insomnia.   All other systems reviewed and are negative.  Past Medical History:  Diagnosis Date  . Anemia    low iron  . Arthritis    bilateral knees  . Atrial fibrillation, chronic   . Chronic diastolic CHF (congestive heart failure) (HCC) 06/15/2018  . History of subdural hematoma   . Hyperlipidemia   . Hypertension   . Morbid obesity (HCC)   . Normal coronary arteries     by cardiac catheterization performed 03/14/06  .  Pre-diabetes    pt denies  . S/P TAVR (transcatheter aortic valve replacement)    a. 07/25/18: Edwards Sapien 3 THV (size 26 mm, model # 9600TFX, serial # [id number]) by Dr. Laneta Simmers and Dr. Clifton James  . Sleep apnea    on CPAP  . Venous insufficiency    Past Surgical History:  Procedure Laterality Date  . BIOPSY  01/26/2019   Procedure: BIOPSY;  Surgeon: Bernette Redbird, MD;  Location: [facility] ENDOSCOPY;  Service: Endoscopy;;  . CRANIOTOMY Right 08/27/2016   Procedure: CRANIOTOMY HEMATOMA EVACUATION SUBDURAL;  Surgeon: Maeola Harman, MD;  Location: [facility] OR;  Service: Neurosurgery;  Laterality: Right;  . ESOPHAGOGASTRODUODENOSCOPY N/A 01/21/2019   Procedure: ESOPHAGOGASTRODUODENOSCOPY (EGD);  Surgeon: Vida Rigger, MD;  Location: [facility] ENDOSCOPY;  Service: Endoscopy;  Laterality: N/A;  . ESOPHAGOGASTRODUODENOSCOPY (EGD) WITH PROPOFOL N/A 01/22/2019   Procedure: ESOPHAGOGASTRODUODENOSCOPY (EGD) WITH PROPOFOL;  Surgeon: Bernette Redbird, MD;  Location: [facility] ENDOSCOPY;  Service: Endoscopy;  Laterality: N/A;  . ESOPHAGOGASTRODUODENOSCOPY (EGD) WITH PROPOFOL N/A 01/26/2019   Procedure: ESOPHAGOGASTRODUODENOSCOPY (EGD) WITH PROPOFOL;  Surgeon: Bernette Redbird, MD;  Location: [facility] ENDOSCOPY;  Service: Endoscopy;  Laterality: N/A;  . GASTRIC BYPASS  09/2008  . JOINT REPLACEMENT  08/31/2006   right hip  . MULTIPLE EXTRACTIONS WITH ALVEOLOPLASTY N/A 07/13/2018   Procedure: Extraction of tooth #'s 3,8,10, 23-26, 30and 32 with alveoloplasty and gross debridement of remaining teeth;  Surgeon: Charlynne Pander, DDS;  Location: [facility] OR;  Service: Oral Surgery;  Laterality: N/A;  . RIGHT HEART CATH N/A 07/06/2018   Procedure: RIGHT HEART CATH;  Surgeon: Kathleene Hazel, MD;  Location: [facility] INVASIVE CV LAB;  Service: Cardiovascular;  Laterality: N/A;  . RIGHT/LEFT HEART CATH AND CORONARY ANGIOGRAPHY N/A 07/10/2018   Procedure: RIGHT/LEFT HEART CATH AND CORONARY ANGIOGRAPHY;  Surgeon:  Dolores Patty, MD;  Location: [facility] INVASIVE CV LAB;  Service: Cardiovascular;  Laterality: N/A;  . TEE WITHOUT CARDIOVERSION N/A 07/25/2018   Procedure: TRANSESOPHAGEAL ECHOCARDIOGRAM (TEE);  Surgeon: Kathleene Hazel, MD;  Location: [facility] OR;  Service: Open Heart Surgery;  Laterality: N/A;  . TEE WITHOUT CARDIOVERSION N/A 01/18/2019   Procedure: TRANSESOPHAGEAL ECHOCARDIOGRAM (TEE) WITH PROPOFOL;  Surgeon: Jonelle Sidle, MD;  Location: [facility] ORS;  Service: Cardiovascular;  Laterality: N/A;  . TOTAL HIP ARTHROPLASTY     right hip  . TRANSCATHETER AORTIC VALVE REPLACEMENT, TRANSFEMORAL  07/25/2018  . TRANSCATHETER AORTIC VALVE REPLACEMENT, TRANSFEMORAL N/A 07/25/2018   Procedure: TRANSCATHETER AORTIC VALVE REPLACEMENT, TRANSFEMORAL;  Surgeon: Kathleene Hazel, MD;  Location: [facility] OR;  Service: Open Heart Surgery;  Laterality: N/A;   Family History  Problem Relation Age of Onset  . Hypertension Father   . Cancer Father        prob prostate  . Alzheimer's disease Mother   . Alzheimer's disease Maternal Grandfather   . Breast cancer Sister   . Cancer Brother        "brain tumor"   Social History:  reports that he has quit smoking. He has never used smokeless tobacco. He reports that he does not drink alcohol or use drugs. Allergies:  Allergies  Allergen Reactions  . Kayexalate [Polystyrene] Other (See Comments)    Recommended to avoid by GI due to ischemic gastropathy   Medications Prior to Admission  Medication Sig Dispense Refill  . acetaminophen (TYLENOL) 650 MG CR tablet Take 650 mg by mouth 2 (two) times daily as needed for pain.    Marland Kitchen amoxicillin (AMOXIL) 500 MG capsule Take 2,000 mg (4 tablets) one  hour prior to dental visits. 8 capsule 3  . apixaban (ELIQUIS) 5 MG TABS tablet Take 1 tablet (5 mg total) by mouth 2 (two) times daily. 180 tablet 1  . aspirin 81 MG chewable tablet Chew 1 tablet (81 mg total) by mouth daily.    Marland Kitchen atorvastatin (LIPITOR) 80 MG tablet TAKE 1  TABLET BY MOUTH ONCE DAILY WITH  BREAKFAST 90 tablet 1  . colchicine 0.6 MG tablet TAKE 1 TABLET BY MOUTH TWICE DAILY 60 tablet 2  . diltiazem (CARDIZEM CD) 120 MG 24 hr capsule Take 1 capsule (120 mg total) by mouth daily. 90 capsule 1  . ergocalciferol (VITAMIN D2) 1.25 MG (50000 UT) capsule Take 50,000 Units by mouth once a week.    . spironolactone (ALDACTONE) 25 MG tablet Take 0.5 tablets (12.5 mg total) by mouth daily. 30 tablet 5  . torsemide (DEMADEX) 20 MG tablet Take 40 mg by mouth as needed. Use as needed if weight is over 245 pounds.      Drug Regimen Review Drug regimen was reviewed and remains appropriate with no significant issues identified  Home: Home Living Family/patient expects to be discharged to:: Private residence Living Arrangements: Spouse/significant other Available Help at Discharge: Family, Available 24 hours/day Type of Home: House Home Access: Stairs to enter Secretary/administrator of Steps: 3 Entrance Stairs-Rails: Right Home Layout: One level Bathroom Shower/Tub: Tub/shower unit, Health visitor: Handicapped height Bathroom Accessibility: Yes Home Equipment: Environmental consultant - 2 wheels, Cane - single point  Lives With: Spouse   Functional History: Prior Function Level of Independence: Independent Comments: community ambulator, drives, was actively going to [employer] and using the pool there  Functional Status:  Mobility: Bed Mobility Overal bed mobility: Needs Assistance Bed Mobility: Rolling Rolling: +2 for physical assistance, Max assist Supine to sit: +2 for physical assistance, HOB elevated, Max assist Sit to supine: +2 for physical assistance, Max assist General bed mobility comments: Two person max assist to roll bil, he was able to help more rolling toward his right side as he can better pull with his left hand.  His wife provided the second person assist and reports she took care of her mother for years.  She seemed very comfortable  in managing him physically.  Transfers Overall transfer level: Needs assistance Equipment used: Rolling walker (2 wheeled) Transfer via Lift Equipment: [employer] Transfers: Sit to/from Stand, [employer] Transfers Sit to Stand: Mod assist, Max assist Stand pivot transfers: Mod assist, Max assist General transfer comment: Assisted in returning pt to bed after being up for >1 hour.  Ambulation/Gait Ambulation/Gait assistance: Max assist Gait Distance (Feet): 4 Feet Assistive device: Rolling walker (2 wheeled) Gait Pattern/deviations: Decreased step length - right, Decreased step length - left, Decreased stride length General Gait Details: limited to 7-8 slow labored side steps due to BLE weakness Gait velocity: slow    ADL: ADL Overall ADL's : Needs assistance/impaired Eating/Feeding: Set up, Bed level Eating/Feeding Details (indicate cue type and reason): pt reports having increased appetite today, reports no nausea Grooming: Min guard, Set up, Bed level, Wash/dry face, Sitting Grooming Details (indicate cue type and reason): pt with assist to apply lotion to back Upper Body Bathing: Minimal assistance, Sitting Lower Body Bathing: Maximal assistance, +2 for physical assistance, +2 for safety/equipment, Sitting/lateral leans Upper Body Dressing : Moderate assistance, Sitting Lower Body Dressing: +2 for physical assistance, +2 for safety/equipment, Sitting/lateral leans, Maximal assistance, Total assistance Lower Body Dressing Details (indicate cue type and reason): assist  to don socks at bed level Toileting- Clothing Manipulation and Hygiene: Total assistance, +2 for physical assistance, +2 for safety/equipment, Bed level Functional mobility during ADLs: Maximal assistance, Total assistance, +2 for physical assistance, +2 for safety/equipment General ADL Comments: pt reports increased fatigue today having had EGD earlier and transferring to new floors, agreeable to bed level therapy  session  Cognition: Cognition Overall Cognitive Status: (not specifically tested, has to be reminded of L lean) Arousal/Alertness: Awake/alert Orientation Level: Oriented X4 Attention: Selective Selective Attention: Impaired Selective Attention Impairment: Verbal basic Memory: Impaired Memory Impairment: Storage deficit, Retrieval deficit, Decreased recall of new information Awareness: Impaired Awareness Impairment: Anticipatory impairment Cognition Arousal/Alertness: Awake/alert Behavior During Therapy: WFL for tasks assessed/performed Overall Cognitive Status: (not specifically tested, has to be reminded of L lean) Area of Impairment: Problem solving, Following commands Following Commands: Follows one step commands with increased time Problem Solving: Slow processing General Comments: pt is very sweet, pleasant and motivated to work with therapy  Physical Exam: Blood pressure 135/80, pulse 72, temperature (!) 97.5 F (36.4 C), temperature source Oral, resp. rate 20, height  (1.854 m), weight 126.9 kg, SpO2 99 %. Physical Exam  Constitutional: No distress.  obese  HENT:  Head: Normocephalic and atraumatic.  Poor dentition, missing multiple teeth  Eyes: Pupils are equal, round, and reactive to light. EOM are normal. Right eye exhibits no discharge. Left eye exhibits no discharge.  Neck: Normal range of motion. No thyromegaly present.  Cardiovascular: Normal rate and regular rhythm.  Murmur heard. Respiratory: Effort normal. No respiratory distress. He has no wheezes. He has no rales.  GI: He exhibits no distension. There is no abdominal tenderness. There is no rebound.  Musculoskeletal:        General: Edema present.  Neurological:  Pt awake, alert. Fair insight and awareness. Oriented to month, year. Decreased STM. Normal language. Follows all basic commands. RUE grossly 3/5 prox to distal. LUE 3+ to 4/5 prox to distal. RLE 2+ to 3/5 prox to distal. LLE 3- to 4/5 with  inhibition to pain at left great toe. No focal sensory deficits.  Skin: Skin is warm and dry. He is not diaphoretic.  Psychiatric: He has a normal mood and affect. His behavior is normal.    Results for orders placed or performed during the hospital encounter of 01/13/19 (from the past 48 hour(s))  CBC     Status: Abnormal   Collection Time: 01/29/19  5:03 AM  Result Value Ref Range   WBC 14.4 (H) 4.0 - 10.5 K/uL   RBC 3.52 (L) 4.22 - 5.81 MIL/uL   Hemoglobin 9.4 (L) 13.0 - 17.0 g/dL   HCT 16.1 (L) 09.6 - 04.5 %   MCV 82.7 80.0 - 100.0 fL   MCH 26.7 26.0 - 34.0 pg   MCHC 32.3 30.0 - 36.0 g/dL   RDW 40.9 (H) 81.1 - 91.4 %   Platelets 125 (L) 150 - 400 K/uL    Comment: REPEATED TO VERIFY   nRBC 0.0 0.0 - 0.2 %    Comment: Performed at [facility], [address] [place], [name] [address]  Basic metabolic panel     Status: Abnormal   Collection Time: 01/29/19  5:03 AM  Result Value Ref Range   Sodium 135 135 - 145 mmol/L   Potassium 3.3 (L) 3.5 - 5.1 mmol/L   Chloride 108 98 - 111 mmol/L   CO2 20 (L) 22 - 32 mmol/L   Glucose, Bld 113 (H)  70 - 99 mg/dL   BUN 13 8 - 23 mg/dL   Creatinine, Ser 1.611.00 0.61 - 1.24 mg/dL   Calcium 7.7 (L) 8.9 - 10.3 mg/dL   GFR calc non Af Amer >60 >60 mL/min   GFR calc Af Amer >60 >60 mL/min   Anion gap 7 5 - 15    Comment: Performed at [facility], [address] [place], [place] [address]   No results found.     Medical Problem List and Plan: 1.   Persistent right side weakness with decreased functional mobility secondary to  cardioembolic bi-cerebral infarctions  -admit to inpatient rehab 2.  Antithrombotics: -DVT/anticoagulation:  SCDs.Dopplers negative for DVT    -eliquis on hold due to GIB  -antiplatelet therapy: ASA on hold d/t GIB 3. Pain Management: Tylenol as needed 4. Mood:  Provide emotional support  -antipsychotic agents: None 5. Neuropsych: This patient is capable of making decisions on his own behalf. 6.  Skin/Wound Care:  Routine skin checks 7. Fluids/Electrolytes/Nutrition:  Routine in and out's with follow-up chemistries 8.  Right lower extremity cellulitis with sepsis/Streptococcus bacteremia. Intravenous Ancef completed 01/30/2019 9. Upper GI bleed. EGD 3. Follow-up gastroenterology services. Continue PPI. WILL DISCUSS PLAN ON TIMING TO RESUME ANTICOAGULATION. 10. Acute blood loss anemia. Patient has been transfused to do 6 units pack red blood cells. Follow-up CBC. 11. Chronic atrial fibrillation as well as history of TAVR procedure. Diltiazem 120 mg daily.Cardiac rate controlled. 12. Hypertension. Aldactone 12.5 mg daily. Monitor with increased mobility 13. Hyperlipidemia. Lipitor 14. OSA. CPAP      Mcarthur RossettiDaniel J Angiulli, PA-C 01/30/2019

## 2019-01-30 ENCOUNTER — Inpatient Hospital Stay (HOSPITAL_COMMUNITY)
Admission: RE | Admit: 2019-01-30 | Discharge: 2019-02-03 | DRG: 056 | Disposition: A | Discharge status: Other Acute Inpatient Hospital | Payer: Medicare Other | Source: Intra-hospital | Attending: Physical Medicine & Rehabilitation | Admitting: Physical Medicine & Rehabilitation

## 2019-01-30 ENCOUNTER — Encounter (HOSPITAL_COMMUNITY): Payer: Self-pay | Admitting: *Deleted

## 2019-01-30 ENCOUNTER — Other Ambulatory Visit: Payer: Self-pay

## 2019-01-30 DIAGNOSIS — E785 Hyperlipidemia, unspecified: Secondary | ICD-10-CM | POA: Diagnosis present

## 2019-01-30 DIAGNOSIS — L03115 Cellulitis of right lower limb: Secondary | ICD-10-CM

## 2019-01-30 DIAGNOSIS — Z7982 Long term (current) use of aspirin: Secondary | ICD-10-CM

## 2019-01-30 DIAGNOSIS — I5032 Chronic diastolic (congestive) heart failure: Secondary | ICD-10-CM | POA: Diagnosis not present

## 2019-01-30 DIAGNOSIS — I482 Chronic atrial fibrillation, unspecified: Secondary | ICD-10-CM | POA: Diagnosis not present

## 2019-01-30 DIAGNOSIS — I69351 Hemiplegia and hemiparesis following cerebral infarction affecting right dominant side: Secondary | ICD-10-CM | POA: Diagnosis not present

## 2019-01-30 DIAGNOSIS — I4901 Ventricular fibrillation: Secondary | ICD-10-CM | POA: Diagnosis not present

## 2019-01-30 DIAGNOSIS — Z8249 Family history of ischemic heart disease and other diseases of the circulatory system: Secondary | ICD-10-CM | POA: Diagnosis not present

## 2019-01-30 DIAGNOSIS — Z87891 Personal history of nicotine dependence: Secondary | ICD-10-CM

## 2019-01-30 DIAGNOSIS — Z87820 Personal history of traumatic brain injury: Secondary | ICD-10-CM | POA: Diagnosis not present

## 2019-01-30 DIAGNOSIS — M109 Gout, unspecified: Secondary | ICD-10-CM | POA: Diagnosis present

## 2019-01-30 DIAGNOSIS — Z7901 Long term (current) use of anticoagulants: Secondary | ICD-10-CM

## 2019-01-30 DIAGNOSIS — I249 Acute ischemic heart disease, unspecified: Secondary | ICD-10-CM | POA: Diagnosis not present

## 2019-01-30 DIAGNOSIS — Z952 Presence of prosthetic heart valve: Secondary | ICD-10-CM | POA: Diagnosis not present

## 2019-01-30 DIAGNOSIS — Z803 Family history of malignant neoplasm of breast: Secondary | ICD-10-CM

## 2019-01-30 DIAGNOSIS — I69319 Unspecified symptoms and signs involving cognitive functions following cerebral infarction: Secondary | ICD-10-CM

## 2019-01-30 DIAGNOSIS — I872 Venous insufficiency (chronic) (peripheral): Secondary | ICD-10-CM | POA: Diagnosis not present

## 2019-01-30 DIAGNOSIS — G8389 Other specified paralytic syndromes: Secondary | ICD-10-CM | POA: Diagnosis not present

## 2019-01-30 DIAGNOSIS — I6389 Other cerebral infarction: Secondary | ICD-10-CM

## 2019-01-30 DIAGNOSIS — I110 Hypertensive heart disease with heart failure: Secondary | ICD-10-CM | POA: Diagnosis present

## 2019-01-30 DIAGNOSIS — I6349 Cerebral infarction due to embolism of other cerebral artery: Secondary | ICD-10-CM

## 2019-01-30 DIAGNOSIS — D62 Acute posthemorrhagic anemia: Secondary | ICD-10-CM | POA: Diagnosis present

## 2019-01-30 DIAGNOSIS — Z96641 Presence of right artificial hip joint: Secondary | ICD-10-CM | POA: Diagnosis present

## 2019-01-30 DIAGNOSIS — K922 Gastrointestinal hemorrhage, unspecified: Secondary | ICD-10-CM

## 2019-01-30 DIAGNOSIS — G4733 Obstructive sleep apnea (adult) (pediatric): Secondary | ICD-10-CM | POA: Diagnosis not present

## 2019-01-30 DIAGNOSIS — Z820 Family history of epilepsy and other diseases of the nervous system: Secondary | ICD-10-CM | POA: Diagnosis not present

## 2019-01-30 LAB — CBC WITH DIFFERENTIAL/PLATELET
Abs Immature Granulocytes: 0.07 10*3/uL (ref 0.00–0.07)
Basophils Absolute: 0 10*3/uL (ref 0.0–0.1)
Basophils Relative: 0 %
Eosinophils Absolute: 0 10*3/uL (ref 0.0–0.5)
Eosinophils Relative: 0 %
HCT: 29.1 % — ABNORMAL LOW (ref 39.0–52.0)
HEMOGLOBIN: 9.3 g/dL — AB (ref 13.0–17.0)
Immature Granulocytes: 0 %
LYMPHS PCT: 7 %
Lymphs Abs: 1.2 10*3/uL (ref 0.7–4.0)
MCH: 26.1 pg (ref 26.0–34.0)
MCHC: 32 g/dL (ref 30.0–36.0)
MCV: 81.5 fL (ref 80.0–100.0)
Monocytes Absolute: 1.4 10*3/uL — ABNORMAL HIGH (ref 0.1–1.0)
Monocytes Relative: 9 %
Neutro Abs: 14 10*3/uL — ABNORMAL HIGH (ref 1.7–7.7)
Neutrophils Relative %: 84 %
Platelets: 121 10*3/uL — ABNORMAL LOW (ref 150–400)
RBC: 3.57 MIL/uL — ABNORMAL LOW (ref 4.22–5.81)
RDW: 21 % — ABNORMAL HIGH (ref 11.5–15.5)
WBC: 16.7 10*3/uL — ABNORMAL HIGH (ref 4.0–10.5)
nRBC: 0 % (ref 0.0–0.2)

## 2019-01-30 LAB — BASIC METABOLIC PANEL
Anion gap: 3 — ABNORMAL LOW (ref 5–15)
BUN: 10 mg/dL (ref 8–23)
CHLORIDE: 108 mmol/L (ref 98–111)
CO2: 24 mmol/L (ref 22–32)
CREATININE: 1.01 mg/dL (ref 0.61–1.24)
Calcium: 8.1 mg/dL — ABNORMAL LOW (ref 8.9–10.3)
GFR calc Af Amer: >60 mL/min (ref >60)
GFR calc non Af Amer: >60 mL/min (ref >60)
Glucose, Bld: 130 mg/dL — ABNORMAL HIGH (ref 70–99)
Potassium: 3.4 mmol/L — ABNORMAL LOW (ref 3.5–5.1)
Sodium: 135 mmol/L (ref 135–145)

## 2019-01-30 LAB — CULTURE, BLOOD (ROUTINE X 2)
CULTURE: NO GROWTH
Culture: NO GROWTH
Special Requests: ADEQUATE

## 2019-01-30 MED ORDER — SORBITOL 70 % SOLN: 30.0000 mL | Freq: Every day | Status: DC | PRN

## 2019-01-30 MED ORDER — POLYETHYLENE GLYCOL 3350 17 G PO PACK: 17.0000 g | PACK | Freq: Every day | ORAL | Status: DC | PRN

## 2019-01-30 MED ORDER — SPIRONOLACTONE 12.5 MG HALF TABLET
12.5000 mg | ORAL_TABLET | Freq: Every day | ORAL | Status: DC
  Administered 2019-01-31 – 2019-02-02 (×3): 12.5 mg via ORAL
  Filled 2019-01-30 (×4): qty 1

## 2019-01-30 MED ORDER — DILTIAZEM HCL ER COATED BEADS 120 MG PO CP24
120.0000 mg | ORAL_CAPSULE | Freq: Every day | ORAL | Status: DC
  Administered 2019-01-30: 120 mg via ORAL
  Filled 2019-01-30: qty 1

## 2019-01-30 MED ORDER — ATORVASTATIN CALCIUM 80 MG PO TABS
80.0000 mg | ORAL_TABLET | Freq: Every day | ORAL | Status: DC
  Administered 2019-01-30 – 2019-02-02 (×4): 80 mg via ORAL
  Filled 2019-01-30 (×4): qty 1

## 2019-01-30 MED ORDER — DILTIAZEM HCL ER COATED BEADS 120 MG PO CP24
120.0000 mg | ORAL_CAPSULE | Freq: Every day | ORAL | Status: DC
  Administered 2019-01-31 – 2019-02-02 (×3): 120 mg via ORAL
  Filled 2019-01-30 (×4): qty 1

## 2019-01-30 MED ORDER — ONDANSETRON HCL 4 MG/2ML IJ SOLN: 4.0000 mg | Freq: Four times a day (QID) | INTRAMUSCULAR | Status: DC | PRN

## 2019-01-30 MED ORDER — ONDANSETRON HCL 4 MG PO TABS: 4.0000 mg | ORAL_TABLET | Freq: Four times a day (QID) | ORAL | Status: DC | PRN

## 2019-01-30 MED ORDER — POTASSIUM CHLORIDE CRYS ER 20 MEQ PO TBCR
40.0000 meq | EXTENDED_RELEASE_TABLET | Freq: Once | ORAL | Status: AC
  Administered 2019-01-30: 40 meq via ORAL
  Filled 2019-01-30: qty 2

## 2019-01-30 MED ORDER — PREDNISONE 5 MG PO TABS
10.0000 mg | ORAL_TABLET | Freq: Two times a day (BID) | ORAL | Status: AC
  Administered 2019-01-30 – 2019-02-01 (×4): 10 mg via ORAL
  Filled 2019-01-30 (×4): qty 2

## 2019-01-30 MED ORDER — PANTOPRAZOLE SODIUM 40 MG PO TBEC
40.0000 mg | DELAYED_RELEASE_TABLET | Freq: Two times a day (BID) | ORAL | Status: DC
  Administered 2019-01-31 – 2019-02-03 (×7): 40 mg via ORAL
  Filled 2019-01-30 (×7): qty 1

## 2019-01-30 MED ORDER — PANTOPRAZOLE SODIUM 40 MG PO TBEC
40.0000 mg | DELAYED_RELEASE_TABLET | Freq: Two times a day (BID) | ORAL | Status: DC
  Administered 2019-01-30: 40 mg via ORAL
  Filled 2019-01-30: qty 1

## 2019-01-30 MED ORDER — SUCRALFATE 1 GM/10ML PO SUSP
1.0000 g | Freq: Four times a day (QID) | ORAL | Status: DC
  Administered 2019-01-30 – 2019-02-03 (×15): 1 g via ORAL
  Filled 2019-01-30 (×16): qty 10

## 2019-01-30 MED ORDER — ACETAMINOPHEN 325 MG PO TABS
650.0000 mg | ORAL_TABLET | Freq: Four times a day (QID) | ORAL | Status: DC | PRN
  Administered 2019-01-31: 650 mg via ORAL
  Filled 2019-01-30: qty 2

## 2019-01-30 NOTE — Progress Notes (Signed)
Received pt. As a new admission,pt. And his family were oriented to rehab unit protocol.On the initial skin assessment a small un blanchable dark spot was found on rt. Sacrum,keep monitoring skin closely.

## 2019-01-30 NOTE — Progress Notes (Signed)
Physical Therapy Treatment Patient Details Name: Joel Rogers MRN: [id number] DOB: Oct 21, 1951 Today's Date: 01/30/2019    History of Present Illness Pt is a 68 y/o male admitted 01/13/19 (originally to [facility]) secondary to R UE and R LE pain with sepsis due to streptococcus bacteremia and cellulitis. During admission, pt with persistent R sided weakness. An MRI of the brain revealed multiple acute ischemic infarcts in a pattern that is most consistent with cardioembolic strokes and pt was transferred to [facility] for further care.  Pt's stay further complicated by hematemesis who underwent upper GI endoscopy on 01/21/19 and 2 units of PRBCs due to ABLA.  PMH including but not limited to atrial fibrillation on anticoagulation, CHF, history of subdural hematoma, HLD, HTN, TAVR placement in 9/19 and sleep apnea.    PT Comments    Patient progressing with sitting balance, but still not able to stand even with bariatric stedy and +2 A with elevated bed and couple of trials.  Still with difficulty coming forward and c/o R thigh pain.  Feel should progress with intensive skilled rehab at [facility].  Wife able to assist as well.  PT To follow.    Follow Up Recommendations  [facility]IR     Equipment Recommendations  Wheelchair (measurements PT);Wheelchair cushion (measurements PT);Hospital bed;Other (comment)    Recommendations for Other Services       Precautions / Restrictions Precautions Precautions: Fall Precaution Comments: R sided weakness and R hip/thigh pain    Mobility  Bed Mobility Overal bed mobility: Needs Assistance Bed Mobility: Rolling;Sidelying to Sit Rolling: +2 for physical assistance;Max assist Sidelying to sit: Max assist;+2 for physical assistance       General bed mobility comments: cues for technique assist for rolling with shoulder and hip despite cues for pushing through L leg; assist for trunk and legs to come up to sit  Transfers Overall transfer level: Needs  assistance               General transfer comment: attempted up to stand from elevated bed with baristedy, but pt unable to assist despite cues and max A of2.  So placed maximove sling and assisted to chair  Ambulation/Gait                 Stairs             Wheelchair Mobility    Modified Rankin (Stroke Patients Only)       Balance Overall balance assessment: Needs assistance Sitting-balance support: Feet supported;Bilateral upper extremity supported;Single extremity supported Sitting balance-Leahy Scale: Poor Sitting balance - Comments: leaning L, could not stay seated without UE support or assist to prevent eventual L lateral lean, could correct momentarily with cues, but not maintain                                    Cognition Arousal/Alertness: Awake/alert Behavior During Therapy: WFL for tasks assessed/performed Overall Cognitive Status: Impaired/Different from baseline Area of Impairment: Awareness;Problem solving;Safety/judgement                       Following Commands: Follows one step commands consistently Safety/Judgement: Decreased awareness of deficits Awareness: Emergent Problem Solving: Slow processing        Exercises General Exercises - Upper Extremity Digit Composite Flexion: AROM;Strengthening;Right;10 reps Composite Extension: AROM;Strengthening;Right;10 reps;Seated General Exercises - Lower Extremity Heel Slides: AROM;AAROM;5 reps;Both;Supine  General Comments General comments (skin integrity, edema, etc.): wife present during treatment, seen with OT who assisted with bathing while mobilizing      Pertinent Vitals/Pain Pain Assessment: Faces Faces Pain Scale: Hurts even more Pain Location: R LE with ROM Pain Descriptors / Indicators: Guarding;Sore Pain Intervention(s): Limited activity within patient's tolerance    Home Living                      Prior Function            PT  Goals (current goals can now be found in the care plan section) Acute Rehab PT Goals Patient Stated Goal: to be free Progress towards PT goals: Progressing toward goals    Frequency    Min 4X/week      PT Plan Current plan remains appropriate    Co-evaluation PT/OT/SLP Co-Evaluation/Treatment: Yes Reason for Co-Treatment: Complexity of the patient's impairments (multi-system involvement);To address functional/ADL transfers PT goals addressed during session: Mobility/safety with mobility;Balance OT goals addressed during session: ADL's and self-care;Strengthening/ROM      AM-PAC PT "6 Clicks" Mobility   Outcome Measure  Help needed turning from your back to your side while in a flat bed without using bedrails?: Total Help needed moving from lying on your back to sitting on the side of a flat bed without using bedrails?: Total Help needed moving to and from a bed to a chair (including a wheelchair)?: Total Help needed standing up from a chair using your arms (e.g., wheelchair or bedside chair)?: Total Help needed to walk in hospital room?: Total Help needed climbing 3-5 steps with a railing? : Total 6 Click Score: 6    End of Session Equipment Utilized During Treatment: Gait belt Activity Tolerance: Patient limited by fatigue Patient left: in chair;with chair alarm set;with call bell/phone within reach;with family/visitor present Nurse Communication: Mobility status;Need for lift equipment PT Visit Diagnosis: Unsteadiness on feet (R26.81);Other abnormalities of gait and mobility (R26.89);Muscle weakness (generalized) (M62.81);Pain Pain - Right/Left: Right Pain - part of body: Hip(thigh)     Time: 7902-4097 PT Time Calculation (min) (ACUTE ONLY): 41 min  Charges:  $Therapeutic Activity: 23-37 mins                     Sheran Lawless, [facility] [phone] 01/30/2019    Elray Mcgregor 01/30/2019, 2:35 PM

## 2019-01-30 NOTE — PMR Pre-admission (Signed)
PMR Admission Coordinator Pre-Admission Assessment  Patient: Joel Rogers is an 68 y.o., male MRN: [id number] DOB: 1951-01-28 Height: _0  (185.4 cm) Weight: 126.9 kg              Insurance Information HMO: Yes    PPO:       PCP:       IPA:       80/20:       OTHER:   PRIMARY: U[employer] Medicare      Policy#: [id number]      Subscriber: patient CM Name:  Blanch Media     Phone#: [phone]     Fax#: [phone] Pre-Cert#: [id number] for 7 days and follow up to Celene Skeen at fax [phone]      Employer: Retired Benefits:  Phone #: [phone]     Name: On line portal Eff. Date: 11/22/18     Deduct: $0      Out of Pocket Max: $3600 (met $30)      Life Max: N/A CIR: $295 days 1-5      SNF: $0 days 1-20; $160 days 21-43; $0 days 44-100 Outpatient: medical necessity     Co-Pay: $30/visit Home Health: 100%      Co-Pay: none DME: 80%     Co-Pay: 20% Providers: in network  Medicaid Application Date:       Case Manager: Disability Application Date:       Case Worker:   Emergency Facilities manager Information    Name Relation Home Work Mobile   [place] Spouse [phone]  [phone]     Current Medical History  Patient Admitting Diagnosis: sepsis, cardio-embolic bi-cerebral infarcts    History of Present Illness: A 68 year old right-handed male with history of diastolic congestive heart failure, aortic stenosis status post TAVR September 2019, atrial fibrillation maintained on Eliquis,OSA-CPAP, hypertension. Per chart review lives with spouse. Community ambulator active and driving. One level home with 3 steps to entry.Presented [facility] hospital 01/13/2019 secondary to right and left upper extremity pain with recent fall and right side weakness and findings of sepsis due to Staphylococcus bacteremia and cellulitis.during admission maintain on antibiotic therapy. Noted persistent right side weakness. MRI of the brain revealed multiple acute ischemic infarctions in a pattern  most consistent with cardioembolic stroke. Patient was [facility] for further evaluation. Venous Doppler studies showed no signs of DVT. Findings of elevated liver function studies ultrasound the abdomen showed no focal lesion identified. Follow-up MRI of the brain with numerous scattered foci of acute ischemia throughout the cerebrum and cerebellum. Many of these lesions suggestive of embolic pattern. No midline shift or mass effect. No proximal intracranial arterial occlusion. Echocardiogram with ejection fraction of 96% normal systolic function. TEE completed showing no vegetation on bioprosthetic aortic valve. Patient remained on eliquis for atrial fibrillation with the addition of aspirin for CVA prophylaxis. Patient developed coffee-ground emesis with acute blood loss anemia requiring transfusions totaling 6 units units pack red blood cells and gastroenterology consulted EGD completed 2 with findings of red blood in the lower third of the esophagus clotted blood in the entire stomach. Blood thinners were placed on hold patient remained NPO with follow-up upper GI endoscopy again completed 01/26/2019 Dr Cristina Gong showing no active bleeding or blood present and appeared to show some resolving ischemic changes in the region of the gastric pouch and the anastomosis. His diet was slowly advanced. Patient's aspirin and Eliquis currently remains on hold and await plan to resume and possibly 3-5 days.  It was recommended to continue Protonix twice daily.  Hospital course AKI with creatinine baseline 1.1-1.23 elevated to 1.95 with renal services consulted 01/23/2019. AKI likely secondary to ischemic ATN in the setting of sepsis. Maintain on gentle IV fluids and creatinine improved 1.00. Patient is completing a course of Ancef for sepsis. Therapy evaluations have been completed with recommendations of physical medicine rehabilitation consult. Patient to be admitted for a comprehensive inpatient  rehabilitation program.   Complete NIHSS TOTAL: 6  Past Medical History  Past Medical History:  Diagnosis Date  . Anemia    low iron  . Arthritis    bilateral knees  . Atrial fibrillation, chronic   . Chronic diastolic CHF (congestive heart failure) ([place]) 06/15/2018  . History of subdural hematoma   . Hyperlipidemia   . Hypertension   . Morbid obesity ([place])   . Normal coronary arteries    by cardiac catheterization performed 03/14/06  . Pre-diabetes    pt denies  . S/P TAVR (transcatheter aortic valve replacement)    a. 07/25/18: Edwards Sapien 3 THV (size 26 mm, model # 9600TFX, serial # [id number]) by Dr. Cyndia Bent and Dr. Angelena Form  . Sleep apnea    on CPAP  . Venous insufficiency     Family History  family history includes Alzheimer's disease in his maternal grandfather and mother; Breast cancer in his sister; Cancer in his brother and father; Hypertension in his father.  Prior Rehab/Hospitalizations: Had heart valve surgery in 07/2018.  No previous rehab.  Has the patient had major surgery during 100 days prior to admission? No  Current Medications   Current Facility-Administered Medications:  .  0.9 %  sodium chloride infusion, , Intravenous, PRN, Buccini, Robert, MD, Last Rate: 10 mL/hr at 01/27/19 0517 .  acetaminophen (TYLENOL) tablet 650 mg, 650 mg, Oral, Q6H PRN, Buccini, Robert, MD .  atorvastatin (LIPITOR) tablet 80 mg, 80 mg, Oral, q1800, Buccini, Robert, MD, 80 mg at 01/29/19 1751 .  ceFAZolin (ANCEF) IVPB 2g/100 mL premix, 2 g, Intravenous, Q8H, Karren Cobble, RPH, Last Rate: 200 mL/hr at 01/30/19 0553, 2 g at 01/30/19 0553 .  diltiazem (CARDIZEM CD) 24 hr capsule 120 mg, 120 mg, Oral, Daily, Eugenie Filler, MD .  metoprolol tartrate (LOPRESSOR) injection 5 mg, 5 mg, Intravenous, Q2H PRN, Buccini, Robert, MD .  ondansetron (ZOFRAN) tablet 4 mg, 4 mg, Oral, Q6H PRN **OR** ondansetron (ZOFRAN) injection 4 mg, 4 mg, Intravenous, Q6H PRN, Buccini, Robert, MD,  4 mg at 01/24/19 0446 .  oxyCODONE (Oxy IR/ROXICODONE) immediate release tablet 5-10 mg, 5-10 mg, Oral, Q4H PRN, Buccini, Robert, MD .  pantoprazole (PROTONIX) EC tablet 40 mg, 40 mg, Oral, BID AC, Eugenie Filler, MD, 40 mg at 01/30/19 0926 .  polyethylene glycol (MIRALAX / GLYCOLAX) packet 17 g, 17 g, Oral, Daily PRN, Buccini, Robert, MD .  sodium chloride flush (NS) 0.9 % injection 10-40 mL, 10-40 mL, Intracatheter, Q12H, Buccini, Robert, MD, 10 mL at 01/30/19 1013 .  sodium chloride flush (NS) 0.9 % injection 10-40 mL, 10-40 mL, Intracatheter, PRN, Buccini, Robert, MD .  spironolactone (ALDACTONE) tablet 12.5 mg, 12.5 mg, Oral, Daily, Eugenie Filler, MD, 12.5 mg at 01/30/19 0926 .  sucralfate (CARAFATE) 1 GM/10ML suspension 1 g, 1 g, Oral, Q6H, Eugenie Filler, MD, 1 g at 01/30/19 0551  Patients Current Diet:  Diet Order            DIET SOFT Room service appropriate? Yes; Fluid consistency: Thin  Diet  effective now              Precautions / Restrictions Precautions Precautions: Fall Precaution Comments: R sided weakness and R hip/thigh pain Restrictions Weight Bearing Restrictions: No   Has the patient had 2 or more falls or a fall with injury in the past year?No.  He suffered one fall PTA, no injury.  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 Walker and SPC  Prior Functional Level Prior Function Level of Independence: Independent Comments: community ambulator, drives, was actively going to [employer] and using the pool there  Self Care: Did the patient need help bathing, dressing, using the  toilet or eating?  Independent  Indoor Mobility: Did the patient need assistance with walking from room to room (with or without device)? Independent  Stairs: Did the patient need assistance with internal or external stairs (with or without device)? Independent  Functional Cognition: Did the patient need help planning regular tasks such as shopping or remembering to take medications? Independent  Current Functional Level Cognition  Arousal/Alertness: Awake/alert Overall Cognitive Status: (not specifically tested, has to be reminded of L lean) Orientation Level: Oriented X4 Following Commands: Follows one step commands with increased time General Comments: pt is very sweet, pleasant and motivated to work with therapy Attention: Selective Selective Attention: Impaired Selective Attention Impairment: Verbal basic Memory: Impaired Memory Impairment: Storage deficit, Retrieval deficit, Decreased recall of new information Awareness: Impaired Awareness Impairment: Anticipatory impairment    Extremity Assessment (includes Sensation/Coordination)  Upper Extremity Assessment: RUE deficits/detail, Generalized weakness RUE Deficits / Details: grossly 2+/5, increased swelling noted in RUE, decreased sensation RUE Sensation: decreased light touch RUE Coordination: decreased fine motor, decreased gross motor LUE Deficits / Details: grossly -4/5  Lower Extremity Assessment: Defer to PT evaluation    ADLs  Overall ADL's : Needs assistance/impaired Eating/Feeding: Set up, Bed level Eating/Feeding Details (indicate cue type and reason): pt reports having increased appetite today, reports no nausea Grooming: Min guard, Set up, Bed level, Wash/dry face, Sitting Grooming Details (indicate cue type and reason): pt with assist to apply lotion to back Upper Body Bathing: Minimal assistance, Sitting Lower Body Bathing: Maximal assistance, +2 for physical assistance, +2 for safety/equipment,  Sitting/lateral leans Upper Body Dressing : Moderate assistance, Sitting Lower Body Dressing: +2 for physical assistance, +2 for safety/equipment, Sitting/lateral leans, Maximal assistance, Total assistance Lower Body Dressing Details (indicate cue type and reason): assist to don socks at bed level Toileting- Clothing Manipulation and Hygiene: Total assistance, +2 for physical assistance, +2 for safety/equipment, Bed level Functional mobility during ADLs: Maximal assistance, Total assistance, +2 for physical assistance, +2 for safety/equipment General ADL Comments: pt reports increased fatigue today having had EGD earlier and transferring to new floors, agreeable to bed level therapy session    Mobility  Overal bed mobility: Needs Assistance Bed Mobility: Rolling Rolling: +2 for physical assistance, Max assist Supine to sit: +2 for physical assistance, HOB elevated, Max assist Sit to supine: +2 for physical assistance, Max assist General bed mobility comments: Two person max assist to roll bil, he was able to help more rolling toward his right side as he can better pull with his left hand.  His wife provided the second person assist and reports she took care of her mother for years.  She seemed very comfortable in managing him physically.     Transfers  Overall transfer level: Needs assistance Equipment used: Rolling walker (2 wheeled) Transfer via Lift Equipment: [employer] Transfers: Sit to/from Stand, [employer] Transfers Sit to Stand: Mod assist, Max assist Stand pivot transfers: Mod assist, Max assist General transfer comment: Assisted in returning pt to bed after being up for >1 hour.     Ambulation / Gait / Stairs / Wheelchair Mobility  Ambulation/Gait Ambulation/Gait assistance: Max Web designer (Feet): 4 Feet Assistive device: Rolling walker (2 wheeled) Gait Pattern/deviations: Decreased step length - right, Decreased step length - left, Decreased stride length General  Gait Details: limited to 7-8 slow labored side steps due to BLE weakness Gait velocity: slow    Posture / Balance Dynamic Sitting Balance Sitting balance - Comments: Pt fatigues quickly and needs external assist to maintain midline posture.  He has a left lateral preference.  He was able to stay up longer and support himself in sitting more today as we were EOB >15 mins.   Balance Overall balance assessment: Needs assistance Sitting-balance support: Feet supported, Bilateral upper extremity supported, Single extremity supported Sitting balance-Leahy Scale: Poor Sitting balance - Comments: Pt fatigues quickly and needs external assist to maintain midline posture.  He has a left lateral preference.  He was able to stay up longer and support himself in sitting more today as we were EOB >15 mins.   Postural control: Left lateral lean Standing balance support: Bilateral upper extremity supported, During functional activity Standing balance-Leahy Scale: Poor Standing balance comment: using RW    Special needs/care consideration BiPAP/CPAP No CPM No Continuous Drip IV No.  Does have IV antibiotic. Dialysis No    Life Vest No Oxygen No Special Bed No Trach Size No Wound Vac (area) No      Skin No                             Bowel mgmt: Last BM 01/26/19 Bladder mgmt: Incontinent, external catheter in place Diabetic mgmt No    Previous Home Environment Living Arrangements: Spouse/significant other  Lives With: Spouse Available Help at Discharge: Family, Available 24 hours/day Type of Home: House Home Layout: One level Home Access: Stairs to enter Entrance Stairs-Rails: Right Entrance Stairs-Number of Steps: 3 Bathroom Shower/Tub: Public librarian, Multimedia programmer: Handicapped height Bathroom Accessibility: Yes How Accessible: Accessible via wheelchair [place]: No  Discharge Living Setting Plans for Discharge Living Setting: Patient's home Type of Home at  Discharge: House Discharge Home Layout: One level Discharge Home Access: Stairs to enter Entrance Stairs-Rails: Left Entrance Stairs-Number of Steps: 2-3 Discharge Bathroom Shower/Tub: Tub/shower unit, Walk-in shower Discharge Bathroom Toilet: Handicapped height Discharge Bathroom Accessibility: Yes How Accessible: Accessible via wheelchair Does the patient have any problems obtaining your medications?: No  Social/Family/Support Systems Patient Roles: Spouse, Parent, 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) Contact Information: Wife - Kennyth Lose Anticipated Caregiver: wife, Kennyth Lose Anticipated Ambulance person Information: [phone] (cell), [phone] (home) Ability/Limitations of Caregiver: Wife stays at home, is not working, she can assist Caregiver Availability: 24/7 Discharge Plan Discussed with Primary Caregiver: Yes Is Caregiver In Agreement with Plan?: Yes Does Caregiver/Family have Issues with Lodging/Transportation while Pt is in Rehab?: No  Goals/Additional Needs Patient/Family Goal for Rehab: PT/OT supervision to min assist, SLP mod I and  supervision goals Expected length of stay: 12-16 days 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, museum/gallery Dietary Needs: soft diet Equipment Needs: tbd Pt/Family Agrees to Admission and willing to participate: Yes Program Orientation Provided & Reviewed with Pt/Caregiver Including Roles  & Responsibilities: Yes  Decrease burden of Care through IP rehab admission: N/A  Possible need for SNF placement upon discharge: Not planned  Patient Condition: This patient's medical and functional status has changed since the consult dated: 01/25/19 in which the Rehabilitation Physician determined and documented that the patient's condition is appropriate for intensive rehabilitative care in an inpatient rehabilitation facility. See "History of Present Illness" (above) for medical update. Functional changes are: Currently requiring Max assist  +2 for mobility and ADLs. Patient's medical and functional status update has been discussed with the Rehabilitation physician and patient remains appropriate for inpatient rehabilitation. Will admit to inpatient rehab today.  Preadmission Screen Completed By:  [name], 01/30/2019 11:50 AM ______________________________________________________________________   Discussed status with Dr. Naaman Plummer on 01/30/19 at 1149 and received telephone approval for admission today.  Admission Coordinator:  [name], time 1149/Date 01/30/19

## 2019-01-30 NOTE — Progress Notes (Signed)
IP rehab admissions - I have approval from insurance carrier for acute inpatient rehab admission.  Awaiting attending MD orders for possible inpatient rehab admission today.  Call me for questions.  [phone]

## 2019-01-30 NOTE — Discharge Summary (Signed)
Physician Discharge Summary  Joel Rogers ZOX:[id number] DOB: December 22, 1950 DOA: 01/13/2019  PCP: [name], MD  Admit date: 01/13/2019 Discharge date: 01/30/2019  Time spent: 60 minutes  Recommendations for Outpatient Follow-up:  1. Patient be discharged to inpatient rehab. 2. Follow-up with GI post discharge from inpatient rehab. 3. Follow-up with neurology post discharge from inpatient rehab. 4. Follow-up with [name], MD 2 weeks post discharge from inpatient rehab.   Discharge Diagnoses:  Principal Problem:   UGIB (upper gastrointestinal bleed) Active Problems:   Acute CVA (cerebrovascular accident) (HCC)   Bacteremia due to Streptococcus   Obstructive sleep apnea   Chronic atrial fibrillation   HTN (hypertension)   Hyperlipemia   Status post gastric bypass for obesity   Vitamin D deficiency   History of Severe aortic stenosis   Varicose veins of right lower extremity with complications   Pulmonary hypertension, unspecified (HCC)   History of subdural hematoma   S/P TAVR (transcatheter aortic valve replacement)   Sepsis (HCC)   Acute renal failure (HCC)   Right shoulder pain   Cellulitis   Upper GI bleed   Discharge Condition: Stable and improved  Diet recommendation: Heart healthy  Filed Weights   01/27/19 1109 01/29/19 0512 01/30/19 0556  Weight: 126.3 kg 126.4 kg 126.9 kg    History of present illness:  Per Dr Dolores Hoose is a 68 y.o. male with medical history significant for diastolic CHF, aortic stenosis status post TAVR, atrial fibrillation on anticoagulation, OSA, HTN, pulmonary hypertension, who presented to the ED with complaints of right shoulder pain over the past 3 to 4 days, but patient has chronic problems with his right shoulder and at baseline he is unable to move it around much.  He also has been feeling like he had the flu for the past 5 days. No cough or difficulty breathing, no chest pain, no cough, no vomiting, no  abdominal pain, no diarrhea, no burning with urination, and no change in urinary frequency as he is on diuretics.  He was unaware of fevers until arrival in the ED.  No recent dental work.  Reported some headache, without neck pain or stiffness, confusion.  Spouse is at bedside-she noticed redness of his right lower extremity this morning, last night she helped him put on his compression stockings so she notes this is new.  Patient denies pain to his extremities.  ED Course: Temperature 102.1, tachycardic to 124, tachypneic to 34, blood pressure systolic 130s to 811B.  O2 sats greater than 98% on room air.  Leukocytosis 21, platelets 88.  Low potassium 3.1.  Oddly elevated creatinine 1.55.  Lactic acidosis 3.2.  UA rare bacteria, negative nitrites and leukocytes.  2 view chest x-ray negative for acute abnormality.  3.5 L bolus given.  Shoulder x-ray-early arthritic changes in the right shoulder, no acute bony abnormality.  Patient started on IV vancomycin metronidazole and cefepime.  Hospitalist to admit for sepsis source not identified  Hospital Course:  1 upper GI bleed Patient status post EGD x3.  Likely secondary to ischemic changes in the region of the gastric pouch and anastomosis which was characterized by necrotic, sloughing tissue as well as some mucosal erythema and erosions noted on upper endoscopy of 01/26/2019.  Biopsies were consistent with ischemic necrosis felt secondary to Kayexalate as Kayexalate crystals were noted per GI. Per GI upper endoscopy done 01/26/2019 with no active bleeding noted or blood in the GI tract.  Patient was on octreotide drip  which has subsequently been discontinued.  Patient status post 6 units packed red blood cells during this hospitalization.  Hemoglobin stabilized at 9.3 by day of discharge. Patient started on clear liquids per GI which he tolerated and has been advanced to full liquid diet which he tolerated as well and subsequently advanced to a soft diet which he  tolerated.  Patient had no further bleeding.  Patient was on Protonix 40 mg IV every 12 hours as well as Carafate per GI recommendations.  Per GI continue  PPI twice daily and 4 times daily sucralfate until discharge and then subsequently once daily PPI indefinitely for ulcer prophylaxis per GI recommendations.  Outpatient follow-up with GI.  2.  Right lower extremity cellulitis with severe sepsis/Streptococcus bacteremia Present on admission at any pain.  Patient underwent a TEE which was negative for vegetations on bioprosthetic aortic valve.  PICC line has been placed.  2 weeks of IV antibiotics have been planned.  Patient initially on IV Ancef and subsequently changed to IV Zosyn due to aspiration pneumonia. Last blood culture from February 22 grew 1 bottle of pansensitive group C streptococcus.  Patient currently afebrile.  Repeat blood cultures from 01/25/2019 with no growth to date.  Patient with a leukocytosis which is fluctuating.  Discontinued IV Zosyn and placed on IV Ancef to complete 2-week course of antibiotic treatment.   3.  Acute embolic CVA Patient with some residual right-sided weakness and dysarthria as well as some probable dysphagia.  MRI MRA head with numerous scattered foci of acute ischemia throughout the cerebrum and cerebellum suggestive of embolic infarcts.  Others noted in the deep watershed distribution as seen in the setting of hypoperfusion events.  Also noted was concern for subarachnoid blood.  Doppler ultrasounds negative for DVT.  2D echo/TEE negative for any vegetations and no source of emboli noted.  Patient with carotid Dopplers on 07/10/2018 and as such those were not repeated.  Patient currently not a candidate for aspirin or Eliquis given ongoing upper GI bleed/issues.  Patient understands the risk of bleeding the risk of stroke with and without anticoagulation.  Patient has been seen in consultation by neurology initially during the hospitalization.    Patient  maintained on Lipitor. Patient being followed by GI who recommend holding anticoagulation for at least 4 more days. Could likely resume anticoagulation per GI in approximately 4 days.    Patient will be discharged to inpatient rehab.   4. ?? aspiration pneumonia Patient noted to have coughing episodes while eating.  Chest x-ray with left lung base opacity likely concern for aspiration.  Patient initially started on clear liquids placed on aspiration precautions and received a full 5-day course of IV Zosyn.  Patient was followed by speech therapy.  Patient will be discharged to inpatient rehabilitation and speech therapy will be following.   5.  Acute blood loss anemia Secondary to problem #1.  Status post 6 units packed red blood cells.  H&H stabilized by day of discharge and was 8.8.   6.  Acute kidney injury on chronic kidney disease stage III Likely secondary to ischemic ATN in the setting of sepsis, hemodynamic compromise due to severe anemia and GI bleed.  Renal ultrasound with no acute abnormalities noted.   Creatinine peaked at 1.9 during the hospital course and acute kidney injury had resolved by day of discharge.  Patient received IV Lasix for volume overload with no worsening of renal function.    7.  Metabolic acidosis Likely secondary to problem #  6.  Bicarb was discontinued per nephrology.  Metabolic acidosis improved.  Outpatient follow-up. Bicarbonate 20.    8.  Thrombocytopenia Currently off antiplatelet agents.  Platelet count was improving and was subsequently fluctuating during the hospitalization.  Patient with no further evidence of bleeding.   9.  Chronic atrial fibrillation Remained rate controlled during the hospitalization. Patient was previously on Eliquis which was held secondary to thrombocytopenia as well as acute GI bleed which subsequently resolved.  Cardizem was held initially and resumed by day of discharge.  Per GI continue to hold anticoagulation for several  days up to about 4 more days prior to resumption.   10.  Small subarachnoid hemorrhage Noted on CT head 01/20/2019.    Patient's mental status remained stable.  May need repeat head CT if mental status worsens and also prior to initiation of anticoagulation.  11.  Hypertension BP remained stable after patient had stabilized and received packed red blood cells.  Patient was given some diuretics and resumed back on home regimen of Cardizem and spironolactone.  Patient will be discharged to inpatient rehabilitation.    12.  Obstructive sleep apnea CPAP nightly.  13.  Volume overload Patient with lower extremity edema.  Likely secondary to aggressive fluid resuscitation during this hospitalization as well as transfusion of packed red blood cells.  Patient denied any shortness of breath.  Patient with good urine output.  Patient was placed on IV Lasix 20 mg every 12 hours x4 doses and was -10.5 L by day of discharge.  Renal function remained stable.  Patient will  be discharged to inpatient rehabilitation.    Procedures:  CT head 01/20/2019  Chest x-ray 01/13/2019, 01/15/2019, 01/21/2019  MRI MRA head 01/19/2019  MRI C-spine 01/19/2019  Renal ultrasound 01/24/2019  Right lower extremity Dopplers 01/14/2019- negative for DVT  Right upper extremity Dopplers 01/18/2019--- negative for DVT  Plain films of the right hip and pelvis 01/16/2019  Right upper quadrant ultrasound 01/18/2019  TEE 01/18/2019-  2D echo 01/15/2019  Upper endoscopy 01/21/2019, 01/22/2019, 01/26/2019  Status post 6 units packed red blood cells.  Significant Diagnostic Tests:  2/27 TEE >>negative for vegetations  2/28 MRI / MRA Head >>numerous scattered foci of acute ischemia throughout the cerebrum, cerebellum, many of these lesions are most suggestive of embolic infarcts / others are in a deep watershed distribution, no proximal intracranial arterial occlusion, moderate narrowing of the M2 segments of both middle cerebral  arteries  3/01 EGD >> clotted blood in the lower third of the esophagus and in the middle third of the esophagus.Clotted blood in the entire stomach. 3/02 EGD >>Red blood in the lower third of the esophagus. Clotted blood in the entire stomach.  Micro Data:  UC 2/22 >>10k insignificant growth  BCx2 2/22 >>Group C strep >>pan sensitive  BCx2 2/24 >> negative   Antimicrobials:   IV Zosyn 01/21/2019>>> 01/27/2019 Cefepime 2/22>2/24 Vancomycin 2/22 > 2/25 Metronidazole 2/22 > 2/13 Ancef 2/25 >3/1 IV Ancef 01/27/2019    Consultations:  Inpatient rehab: Dr.Swartz 01/25/2019  Nephrology: Dr. Ronalee Belts 01/23/2019  PCCM Dr. Molli Knock 01/22/2019  Interventional radiology: Dr. Fredia Sorrow 01/22/2019  General surgery: Dr.Tsuei 01/22/2019  Gastroenterology: Dr. Ewing Schlein 01/21/2019  Neurology: Dr. Otelia Limes 01/20/2019  Discharge Exam: Vitals:   01/30/19 0017 01/30/19 0748  BP: 135/80 (!) 160/91  Pulse: 72 81  Resp: 20 16  Temp: (!) 97.5 F (36.4 C) 98.2 F (36.8 C)  SpO2: 99% 95%    General: NAD Cardiovascular: RRR Respiratory: CTAB  Discharge Instructions  Allergies  Allergen Reactions  . Kayexalate [Polystyrene] Other (See Comments)    Recommended to avoid by GI due to ischemic gastropathy    Contact information for follow-up providers    [facility] .   Specialty:  Radiology Contact information: [address] [id number] [place] [address] [phone]       Micki Riley, MD. Schedule an appointment as soon as possible for a visit in 4 week(s).   Specialties:  Neurology, Radiology Contact information: [address] [name] [address] [phone]        Milinda Antis F, MD. Schedule an appointment as soon as possible for a visit in 2 week(s).   Specialty:  Family Medicine Contact information: [address] [name] [address] [phone]        Runell Gess, MD .   Specialties:  Cardiology,  Radiology Contact information: [address] [name] [address] [phone]        Bernette Redbird, MD. Schedule an appointment as soon as possible for a visit in 3 week(s).   Specialty:  Gastroenterology Contact information: [address] [name] [address] [phone]            Contact information for after-discharge care    Destination    [facility] .   Service:  Skilled Nursing Contact information: [address] [place] [address] [phone]                   The results of significant diagnostics from this hospitalization (including imaging, microbiology, ancillary and laboratory) are listed below for reference.    Significant Diagnostic Studies: Dg Chest 2 View  Result Date: 01/13/2019 CLINICAL DATA:  Weakness.  Pain in shoulders. EXAM: CHEST - 2 VIEW COMPARISON:  07/25/2018 FINDINGS: Cardiomegaly. Prior aortic valve repair. No confluent airspace opacity, effusion or overt edema. No acute bony abnormality. IMPRESSION: Cardiomegaly.  No active disease. Electronically Signed   By: Charlett Nose M.D.   On: 01/13/2019 16:53   Dg Shoulder Right  Result Date: 01/13/2019 CLINICAL DATA:  Right shoulder pain, weakness EXAM: RIGHT SHOULDER - 2+ VIEW COMPARISON:  None. FINDINGS: Early subacromial spurring and spurring at the rotator cuff insertion. Slight joint space narrowing in the glenohumeral joint. No acute bony abnormality. Specifically, no fracture, subluxation, or dislocation. IMPRESSION: Early arthritic changes in the right shoulder. No acute bony abnormality. Electronically Signed   By: Charlett Nose M.D.   On: 01/13/2019 16:54   Ct Head Wo Contrast  Result Date: 01/20/2019 CLINICAL DATA:  68 y/o M; subarachnoid hemorrhage and stroke for follow-up. EXAM: CT HEAD WITHOUT CONTRAST TECHNIQUE: Contiguous axial images were obtained from the base of the skull through the vertex  without intravenous contrast. COMPARISON:  01/19/2019 MRI of the head. FINDINGS: Brain: Multiple foci of hypoattenuation are present within the cerebellum and scattered over the cerebral convexities and within periventricular white matter corresponding to areas of infarction on the prior MRI of the brain. Findings are stable given differences in technique. Stable small volume of subarachnoid hemorrhage within the left central sulcus. No new stroke, hemorrhage, mass effect, intracranial hemorrhage, or herniation identified. Vascular: Calcific atherosclerosis of carotid siphons. No hyperdense vessel identified. Skull: Negative for fracture or focal lesion. Stable right frontal region craniotomy chronic postsurgical changes. Sinuses/Orbits: Mucosal thickening of the left frontal sinus antrum and anterior ethmoid air cells. Additional visible paranasal sinuses and the mastoid  air cells are normally aerated. Orbits are unremarkable. Other: None. IMPRESSION: 1. Multiple foci of infarction are present scattered throughout the supratentorial cortex, periventricular white matter, and throughout the cerebellum. The distribution of infarction is stable from prior MRI given differences in technique. 2. Stable small focus of acute subarachnoid hemorrhage within the left central sulcus. Additional areas of hemorrhage suggested on MRI are not visible on CT, possibly chronic hemosiderin deposition. 3. No new acute intracranial abnormality identified. Electronically Signed   By: Mitzi Hansen M.D.   On: 01/20/2019 18:40   Mr Maxine Glenn Head Wo Contrast  Result Date: 01/19/2019 CLINICAL DATA:  Right upper extremity weakness EXAM: MRI HEAD WITHOUT CONTRAST MRA HEAD WITHOUT CONTRAST TECHNIQUE: Multiplanar, multiecho pulse sequences of the brain and surrounding structures were obtained without intravenous contrast. Angiographic images of the head were obtained using MRA technique without contrast. COMPARISON:  Head CT 10/26/2016  FINDINGS: MRI HEAD FINDINGS BRAIN: There is scattered multifocal diffusion restriction throughout the brain. The largest areas of diffusion abnormality are in the cerebellum. There are bilateral areas of ischemia in the deep watershed distribution. There are multiple bilateral peripheral subcortical foci of ischemia. Areas along the left postcentral gyrus and right anterior convexity are suggestive subarachnoid hemorrhage. There is also magnetic susceptibility effect in the left cerebellum, also likely indicating an area of hemorrhage. There is mild edema corresponding to the sites of diffusion abnormality. Mild generalized volume loss. No midline shift. SKULL AND UPPER CERVICAL SPINE: The visualized skull base, calvarium, upper cervical spine and extracranial soft tissues are normal. SINUSES/ORBITS: No fluid levels or advanced mucosal thickening. No mastoid or middle ear effusion. The orbits are normal. MRA HEAD FINDINGS POSTERIOR CIRCULATION: --Basilar artery: Normal. --Posterior cerebral arteries: Normal. The right PCA is partially supplied by the posterior communicating artery. --Superior cerebellar arteries: Normal. --Inferior cerebellar arteries: Normal left AICA. The posterior inferior cerebral arteries are normal. ANTERIOR CIRCULATION: --Intracranial internal carotid arteries: Normal. --Anterior cerebral arteries: Normal. Both A1 segments are present. Patent anterior communicating artery. --Middle cerebral arteries: Moderate narrowing of the M2 segments bilaterally. --Posterior communicating arteries: Present on the right, absent on the left. IMPRESSION: 1. Numerous scattered foci of acute ischemia throughout the cerebrum and cerebellum. Many of these lesions are most suggestive of embolic infarcts, given the widespread distribution over multiple vascular territories. Others are in a deep watershed distribution, as is seen in the setting of hypoperfusion events. 2. Areas of magnetic susceptibility effect  superimposed on diffusion abnormality along the left postcentral gyrus, right frontal convexity and lateral ventricular atria are suggestive subarachnoid blood. Noncontrast head CT recommended for clarification. 3. No midline shift or other mass effect. 4. No proximal intracranial arterial occlusion. Moderate narrowing of the M2 segments of both middle cerebral arteries. Critical Value/emergent results were called by telephone at the time of interpretation on 01/19/2019 at 5:32 pm to Dr. Durward [facility] , who verbally acknowledged these results. Electronically Signed   By: Deatra Robinson M.D.   On: 01/19/2019 17:33   Mr Brain Wo Contrast  Result Date: 01/19/2019 CLINICAL DATA:  Right upper extremity weakness EXAM: MRI HEAD WITHOUT CONTRAST MRA HEAD WITHOUT CONTRAST TECHNIQUE: Multiplanar, multiecho pulse sequences of the brain and surrounding structures were obtained without intravenous contrast. Angiographic images of the head were obtained using MRA technique without contrast. COMPARISON:  Head CT 10/26/2016 FINDINGS: MRI HEAD FINDINGS BRAIN: There is scattered multifocal diffusion restriction throughout the brain. The largest areas of diffusion abnormality are in the cerebellum. There are bilateral areas of ischemia  in the deep watershed distribution. There are multiple bilateral peripheral subcortical foci of ischemia. Areas along the left postcentral gyrus and right anterior convexity are suggestive subarachnoid hemorrhage. There is also magnetic susceptibility effect in the left cerebellum, also likely indicating an area of hemorrhage. There is mild edema corresponding to the sites of diffusion abnormality. Mild generalized volume loss. No midline shift. SKULL AND UPPER CERVICAL SPINE: The visualized skull base, calvarium, upper cervical spine and extracranial soft tissues are normal. SINUSES/ORBITS: No fluid levels or advanced mucosal thickening. No mastoid or middle ear effusion. The orbits are normal. MRA  HEAD FINDINGS POSTERIOR CIRCULATION: --Basilar artery: Normal. --Posterior cerebral arteries: Normal. The right PCA is partially supplied by the posterior communicating artery. --Superior cerebellar arteries: Normal. --Inferior cerebellar arteries: Normal left AICA. The posterior inferior cerebral arteries are normal. ANTERIOR CIRCULATION: --Intracranial internal carotid arteries: Normal. --Anterior cerebral arteries: Normal. Both A1 segments are present. Patent anterior communicating artery. --Middle cerebral arteries: Moderate narrowing of the M2 segments bilaterally. --Posterior communicating arteries: Present on the right, absent on the left. IMPRESSION: 1. Numerous scattered foci of acute ischemia throughout the cerebrum and cerebellum. Many of these lesions are most suggestive of embolic infarcts, given the widespread distribution over multiple vascular territories. Others are in a deep watershed distribution, as is seen in the setting of hypoperfusion events. 2. Areas of magnetic susceptibility effect superimposed on diffusion abnormality along the left postcentral gyrus, right frontal convexity and lateral ventricular atria are suggestive subarachnoid blood. Noncontrast head CT recommended for clarification. 3. No midline shift or other mass effect. 4. No proximal intracranial arterial occlusion. Moderate narrowing of the M2 segments of both middle cerebral arteries. Critical Value/emergent results were called by telephone at the time of interpretation on 01/19/2019 at 5:32 pm to Dr. Durward Mallard [facility] , who verbally acknowledged these results. Electronically Signed   By: Deatra Robinson M.D.   On: 01/19/2019 17:33   Mr Cervical Spine Wo Contrast  Result Date: 01/19/2019 CLINICAL DATA:  Shoulder pain and progressive weakness EXAM: MRI CERVICAL SPINE WITHOUT CONTRAST TECHNIQUE: Multiplanar, multisequence MR imaging of the cervical spine was performed. No intravenous contrast was administered. COMPARISON:  None.  FINDINGS: Alignment: Normal. Vertebrae: No focal marrow lesion. No compression fracture or evidence of discitis osteomyelitis. Cord: Normal caliber and signal. Posterior Fossa, vertebral arteries, paraspinal tissues: Visualized posterior fossa is normal. Vertebral artery flow voids are preserved. No prevertebral effusion. Disc levels: Sagittal imaging includes the atlantoaxial joint to the level of the T2-3 disc space, with axial imaging of the disc spaces from C2-3 to C6-7. Axial images are degraded by motion. Within that limitation, there is no spinal canal stenosis above the C5 level. At C5-6, there is a small central disc protrusion that narrows the ventral thecal sac with mild spinal canal stenosis. At C6-7, there is a intermediate disc bulge causing mild spinal canal stenosis. There is poor visualization of the neural foramina because of motion. IMPRESSION: 1. Motion degraded examination, particularly limiting assessment of the neural foramina. 2. Mild spinal canal stenosis at C5-6 and C6-7. 3. No acute abnormality of the cervical spine. Electronically Signed   By: Deatra Robinson M.D.   On: 01/19/2019 17:45   US Renal  Result Date: 01/24/2019 CLINICAL DATA:  Acute renal injury EXAM: RENAL / URINARY TRACT ULTRASOUND COMPLETE COMPARISON:  CT from 07/11/2008 FINDINGS: Right Kidney: Renal measurements: 12.7 x 6.1 x 5.7 cm. = volume: 236 mL. Lobulations are noted consistent with focal scarring seen on prior CT examination. No  mass lesion or hydronephrosis is noted. Left Kidney: Renal measurements: 13.2 x 6.3 x 6.1 cm = volume: 272 mL. Echogenicity within normal limits. No mass or hydronephrosis visualized. Bladder: Appears normal for degree of bladder distention. IMPRESSION: Changes of scarring within the right kidney. No acute abnormality is noted. Electronically Signed   By: Alcide Clever M.D.   On: 01/24/2019 07:15   US Venous Img Lower Unilateral Right  Result Date: 01/14/2019 CLINICAL DATA:  Right lower  extremity edema and erythema for 1 day EXAM: RIGHT LOWER EXTREMITY VENOUS DUPLEX ULTRASOUND TECHNIQUE: Doppler venous assessment of the right lower extremity deep venous system was performed, including characterization of spectral flow, compressibility, and phasicity. COMPARISON:  None. FINDINGS: There is complete compressibility of the right common femoral, femoral, and popliteal veins. Doppler analysis demonstrates respiratory phasicity and augmentation of flow with calf compression. No obvious superficial vein or calf vein thrombosis. IMPRESSION: No evidence of right lower extremity DVT. Electronically Signed   By: Jolaine Click M.D.   On: 01/14/2019 13:57   US Venous Img Upper Uni Right  Result Date: 01/18/2019 CLINICAL DATA:  Right upper extremity edema. History of diabetes. Evaluate DVT. EXAM: RIGHT UPPER EXTREMITY VENOUS DOPPLER ULTRASOUND TECHNIQUE: Gray-scale sonography with graded compression, as well as color Doppler and duplex ultrasound were performed to evaluate the upper extremity deep venous system from the level of the subclavian vein and including the jugular, axillary, basilic, radial, ulnar and upper cephalic vein. Spectral Doppler was utilized to evaluate flow at rest and with distal augmentation maneuvers. COMPARISON:  None. FINDINGS: Contralateral Subclavian Vein: Respiratory phasicity is normal and symmetric with the symptomatic side. No evidence of thrombus. Normal compressibility. Internal Jugular Vein: No evidence of thrombus. Normal compressibility, respiratory phasicity and response to augmentation. Subclavian Vein: No evidence of thrombus. Normal compressibility, respiratory phasicity and response to augmentation. Axillary Vein: No evidence of thrombus. Normal compressibility, respiratory phasicity and response to augmentation. Cephalic Vein: No evidence of thrombus. Normal compressibility, respiratory phasicity and response to augmentation. Basilic Vein: No evidence of thrombus.  Normal compressibility, respiratory phasicity and response to augmentation. Brachial Veins: No evidence of thrombus. Normal compressibility, respiratory phasicity and response to augmentation. Radial Veins: No evidence of thrombus. Normal compressibility, respiratory phasicity and response to augmentation. Ulnar Veins: No evidence of thrombus. Normal compressibility, respiratory phasicity and response to augmentation. Venous Reflux:  None visualized. Other Findings:  None visualized. IMPRESSION: No evidence of DVT within the right upper extremity. Electronically Signed   By: Simonne Come M.D.   On: 01/18/2019 15:10   Dg Chest Port 1 View  Result Date: 01/21/2019 CLINICAL DATA:  Cough and leukocytosis. EXAM: PORTABLE CHEST 1 VIEW COMPARISON:  01/15/2019 FINDINGS: Marked cardiomegaly and aortic valve replacement again noted. Pulmonary vascular congestion identified. LEFT basilar opacity noted. No pleural effusion or pneumothorax. A LEFT PICC line is again identified with tip overlying the LOWER SVC. IMPRESSION: New LEFT basilar opacity, question atelectasis versus airspace disease/pneumonia. Marked cardiomegaly with pulmonary vascular congestion. Electronically Signed   By: Harmon Pier M.D.   On: 01/21/2019 11:37   Dg Chest Port 1 View  Result Date: 01/15/2019 CLINICAL DATA:  Bacteremia. EXAM: PORTABLE CHEST 1 VIEW COMPARISON:  01/13/2019 FINDINGS: Lungs are adequately inflated without focal airspace consolidation or effusion. There is moderate stable cardiomegaly. Remainder of the exam is unchanged. IMPRESSION: No acute cardiopulmonary disease. Moderate stable cardiomegaly. Electronically Signed   By: Elberta Fortis M.D.   On: 01/15/2019 13:38   Dg Hip Unilat With Pelvis  2-3 Views Right  Result Date: 01/16/2019 CLINICAL DATA:  Right hip pain EXAM: DG HIP (WITH OR WITHOUT PELVIS) 2-3V RIGHT COMPARISON:  None. FINDINGS: Changes of right hip replacement. No hardware complicating feature. No acute bony  abnormality. No fracture, subluxation or dislocation. IMPRESSION: Prior right hip replacement.  No acute bony abnormality. Electronically Signed   By: Charlett Nose M.D.   On: 01/16/2019 19:48   [place] Ekg Site Rite  Result Date: 01/18/2019 If [facility] image not attached, placement could not be confirmed due to current cardiac rhythm.  US Abdomen Limited Ruq  Result Date: 01/18/2019 CLINICAL DATA:  Increased liver function tests. EXAM: ULTRASOUND ABDOMEN LIMITED RIGHT UPPER QUADRANT COMPARISON:  CT abdomen pelvis July 11, 2018 FINDINGS: Gallbladder: No gallstones or wall thickening visualized. The gallbladder is distended. No sonographic Murphy sign noted by sonographer. Common bile duct: Diameter: 5 mm Liver: No focal lesion identified. Within normal limits in parenchymal echogenicity. Nodular contour of the liver is noted. Portal vein is patent on color Doppler imaging with normal direction of blood flow towards the liver. IMPRESSION: No evidence of acute cholecystitis. Nodular contour of liver, this can be seen in cirrhosis of liver. Electronically Signed   By: Sherian Rein M.D.   On: 01/18/2019 12:22    Microbiology: Recent Results (from the past 240 hour(s))  MRSA PCR Screening     Status: None   Collection Time: 01/22/19  3:22 PM  Result Value Ref Range Status   MRSA by PCR NEGATIVE NEGATIVE Final    Comment:        The GeneXpert MRSA Assay (FDA approved for NASAL specimens only), is one component of a comprehensive MRSA colonization surveillance program. It is not intended to diagnose MRSA infection nor to guide or monitor treatment for MRSA infections. Performed at [facility], [address] [place], [name] [address]   Culture, blood (routine x 2)     Status: None   Collection Time: 01/25/19  6:41 PM  Result Value Ref Range Status   Specimen Description BLOOD RIGHT FOREARM  Final   Special Requests   Final    BOTTLES DRAWN AEROBIC ONLY Blood Culture results may not  be optimal due to an inadequate volume of blood received in culture bottles   Culture   Final    NO GROWTH 5 DAYS Performed at [facility], [address], [place], [name] [address]    Report Status 01/30/2019 FINAL  Final  Culture, blood (routine x 2)     Status: None   Collection Time: 01/25/19  7:10 PM  Result Value Ref Range Status   Specimen Description BLOOD RIGHT HAND  Final   Special Requests   Final    BOTTLES DRAWN AEROBIC ONLY Blood Culture adequate volume   Culture   Final    NO GROWTH 5 DAYS Performed at [facility], [address] [place], [name] [address]    Report Status 01/30/2019 FINAL  Final     Labs: Basic Metabolic Panel: Recent Labs  Lab 01/25/19 0418 01/26/19 0225 01/27/19 0512 01/28/19 0530 01/29/19 0503 01/30/19 0830  NA 147* 145 141 137 135 135  K 3.8 3.6 3.7 3.5 3.3* 3.4*  CL 118* 117* 117* 112* 108 108  CO2 21* 18* 21* 20* 20* 24  GLUCOSE 116* 108* 110* 118* 113* 130*  BUN 44* 37* 25* CREATININE 1.50* 1.35* 1.18 1.14 1.00 1.01  CALCIUM 8.1* 7.7* 8.0* 7.8*  7.7* 8.1*  MG 2.6*  --   --   --   --   --   PHOS 3.1  --   --   --   --   --    Liver Function Tests: Recent Labs  Lab 01/24/19 0606 01/25/19 0418 01/26/19 0225  AST 20 22 21   ALT 5 7 6   ALKPHOS 71 72 69  BILITOT 1.0 1.3* 1.5*  PROT 6.6 6.4* 6.8  ALBUMIN 2.2* 2.1* 2.0*   No results for input(s): LIPASE, AMYLASE in the last 168 hours. No results for input(s): AMMONIA in the last 168 hours. CBC: Recent Labs  Lab 01/25/19 0418 01/26/19 0225 01/27/19 0512 01/28/19 0530 01/29/19 0503 01/30/19 0830  WBC 16.8* 16.8* 14.1* 13.8* 14.4* 16.7*  NEUTROABS 15.3*  --   --   --   --  14.0*  HGB 8.8* 8.6* 8.9* 9.0* 9.4* 9.3*  HCT 27.2* 27.6* 28.5* 27.8* 29.1* 29.1*  MCV 82.9 84.7 83.8 83.2 82.7 81.5  PLT 145* 148* 143* 136* 125* 121*   Cardiac Enzymes: No results for input(s): CKTOTAL, CKMB, CKMBINDEX, TROPONINI in the last 168 hours. BNP: BNP (last 3  results) Recent Labs    06/10/18 2322 07/06/18 1810 07/21/18 1019  BNP 685.0* 654.2* 376.1*    ProBNP (last 3 results) No results for input(s): PROBNP in the last 8760 hours.  CBG: Recent Labs  Lab 01/24/19 1928 01/26/19 1001 01/26/19 1118 01/26/19 1616  GLUCAP 116* 101* 108* 123*       Signed:  Ramiro Harvest MD.  [facility] 01/30/2019, 11:58 AM

## 2019-01-30 NOTE — H&P (Addendum)
Physical Medicine and Rehabilitation Admission H&P        Chief Complaint  Patient presents with  . Shoulder Pain    Chief complaint:  weakness   HPI: Joel Rogers is a 68 year old right-handed male with history of diastolic congestive heart failure, aortic stenosis status post TAVR September 2019, atrial fibrillation maintained on Eliquis,OSA-CPAP, hypertension. Per chart review lives with spouse. Community ambulator active and driving. One level home with 3 steps to entry.Presented [facility] 01/13/2019 secondary to right and left upper extremity pain with recent fall and right side weakness and findings of sepsis due to Staphylococcus bacteremia and cellulitis.during admission maintain on antibiotic therapy. Noted persistent right side weakness. MRI of the brain revealed multiple acute ischemic infarctions in a pattern most consistent with cardioembolic stroke. Patient was transferred to [facility] for further evaluation. Venous Doppler studies showed no signs of DVT. Findings of elevated liver function studies ultrasound the abdomen showed no focal lesion identified. Follow-up MRI of the brain with numerous scattered foci of acute ischemia throughout the cerebrum and cerebellum. Many of these lesions suggestive of embolic pattern. No midline shift or mass effect. No proximal intracranial arterial occlusion. Echocardiogram with ejection fraction of 60% normal systolic function. TEE completed showing no vegetation on bioprosthetic aortic valve. Patient remained on eliquis for atrial fibrillation with the addition of aspirin for CVA prophylaxis. Patient developed coffee-ground emesis with acute blood loss anemia requiring transfusions totaling 6 units units pack red blood cells and gastroenterology consulted EGD completed 2 with findings of red blood in the lower third of the esophagus clotted blood in the entire stomach. Blood thinners were placed on hold patient remained  NPO with follow-up upper GI endoscopy again completed 01/26/2019 Dr Matthias Hughs showing no active bleeding or blood present and appeared to show some resolving ischemic changes in the region of the gastric pouch and the anastomosis. His diet was slowly advanced. Patient's aspirin and Eliquis currently remains on hold and await plan to resume and possibly 3-5 days. It was recommended to continue Protonix twice daily.  Hospital course AKI with creatinine baseline 1.1-1.23 elevated to 1.95 with renal services consulted 01/23/2019. AKI likely secondary to ischemic ATN in the setting of sepsis. Maintain on gentle IV fluids and creatinine improved 1.00. Patient is completing a course of Ancef for sepsis. Therapy evaluations have been completed with recommendations of physical medicine rehabilitation consult. Patient was admitted for a comprehensive rehabilitation program.   Review of Systems  Constitutional: Positive for fever. Negative for chills.  HENT: Negative for hearing loss.   Eyes: Positive for double vision. Negative for blurred vision.  Respiratory: Positive for shortness of breath. Negative for cough.   Cardiovascular: Positive for palpitations and leg swelling.  Gastrointestinal: Positive for abdominal pain, constipation and nausea.  Genitourinary: Positive for urgency. Negative for dysuria, flank pain and hematuria.  Musculoskeletal: Positive for falls, joint pain and myalgias.  Skin: Negative for rash.  Neurological: Positive for dizziness, weakness and headaches. Negative for seizures.  Psychiatric/Behavioral: The patient has insomnia.   All other systems reviewed and are negative.       Past Medical History:  Diagnosis Date  . Anemia      low iron  . Arthritis      bilateral knees  . Atrial fibrillation, chronic    . Chronic diastolic CHF (congestive heart failure) (HCC) 06/15/2018  . History of subdural hematoma    . Hyperlipidemia    . Hypertension    .  Morbid obesity (HCC)    .  Normal coronary arteries      by cardiac catheterization performed 03/14/06  . Pre-diabetes      pt denies  . S/P TAVR (transcatheter aortic valve replacement)      a. 07/25/18: Edwards Sapien 3 THV (size 26 mm, model # 9600TFX, serial # [id number]) by Dr. Laneta Simmers and Dr. Clifton James  . Sleep apnea      on CPAP  . Venous insufficiency           Past Surgical History:  Procedure Laterality Date  . BIOPSY   01/26/2019    Procedure: BIOPSY;  Surgeon: Bernette Redbird, MD;  Location: [facility] ENDOSCOPY;  Service: Endoscopy;;  . CRANIOTOMY Right 08/27/2016    Procedure: CRANIOTOMY HEMATOMA EVACUATION SUBDURAL;  Surgeon: Maeola Harman, MD;  Location[facility] OR;  Service: Neurosurgery;  Laterality: Right;  . ESOPHAGOGASTRODUODENOSCOPY N/A 01/21/2019    Procedure: ESOPHAGOGASTRODUODENOSCOPY (EGD);  Surgeon: Vida Rigger, MD;  Location: [facility] ENDOSCOPY;  Service: Endoscopy;  Laterality: N/A;  . ESOPHAGOGASTRODUODENOSCOPY (EGD) WITH PROPOFOL N/A 01/22/2019    Procedure: ESOPHAGOGASTRODUODENOSCOPY (EGD) WITH PROPOFOL;  Surgeon: Bernette Redbird, MD;  Location: [facility] ENDOSCOPY;  Service: Endoscopy;  Laterality: N/A;  . ESOPHAGOGASTRODUODENOSCOPY (EGD) WITH PROPOFOL N/A 01/26/2019    Procedure: ESOPHAGOGASTRODUODENOSCOPY (EGD) WITH PROPOFOL;  Surgeon: Bernette Redbird, MD;  Location: [facility] ENDOSCOPY;  Service: Endoscopy;  Laterality: N/A;  . GASTRIC BYPASS   09/2008  . JOINT REPLACEMENT   08/31/2006    right hip  . MULTIPLE EXTRACTIONS WITH ALVEOLOPLASTY N/A 07/13/2018    Procedure: Extraction of tooth #'s 3,8,10, 23-26, 30and 32 with alveoloplasty and gross debridement of remaining teeth;  Surgeon: Charlynne Pander, DDS;  Location: [facility] OR;  Service: Oral Surgery;  Laterality: N/A;  . RIGHT HEART CATH N/A 07/06/2018    Procedure: RIGHT HEART CATH;  Surgeon: Kathleene Hazel, MD;  Location: [facility] INVASIVE CV LAB;  Service: Cardiovascular;  Laterality: N/A;  . RIGHT/LEFT HEART CATH AND CORONARY ANGIOGRAPHY N/A 07/10/2018    Procedure:  RIGHT/LEFT HEART CATH AND CORONARY ANGIOGRAPHY;  Surgeon: Dolores Patty, MD;  Location: [facility] INVASIVE CV LAB;  Service: Cardiovascular;  Laterality: N/A;  . TEE WITHOUT CARDIOVERSION N/A 07/25/2018    Procedure: TRANSESOPHAGEAL ECHOCARDIOGRAM (TEE);  Surgeon: Kathleene Hazel, MD;  Location: [facility] OR;  Service: Open Heart Surgery;  Laterality: N/A;  . TEE WITHOUT CARDIOVERSION N/A 01/18/2019    Procedure: TRANSESOPHAGEAL ECHOCARDIOGRAM (TEE) WITH PROPOFOL;  Surgeon: Jonelle Sidle, MD;  Location: [facility] ORS;  Service: Cardiovascular;  Laterality: N/A;  . TOTAL HIP ARTHROPLASTY        right hip  . TRANSCATHETER AORTIC VALVE REPLACEMENT, TRANSFEMORAL   07/25/2018  . TRANSCATHETER AORTIC VALVE REPLACEMENT, TRANSFEMORAL N/A 07/25/2018    Procedure: TRANSCATHETER AORTIC VALVE REPLACEMENT, TRANSFEMORAL;  Surgeon: Kathleene Hazel, MD;  Location: [facility] OR;  Service: Open Heart Surgery;  Laterality: N/A;         Family History  Problem Relation Age of Onset  . Hypertension Father    . Cancer Father          prob prostate  . Alzheimer's disease Mother    . Alzheimer's disease Maternal Grandfather    . Breast cancer Sister    . Cancer Brother          "brain tumor"    Social History:  reports that he has quit smoking. He has never used smokeless tobacco. He reports that he does not drink alcohol or use drugs. Allergies:  Allergies  Allergen Reactions  . Kayexalate [Polystyrene] Other (See Comments)      Recommended to avoid by GI due to ischemic gastropathy          Medications Prior to Admission  Medication Sig Dispense Refill  . acetaminophen (TYLENOL) 650 MG CR tablet Take 650 mg by mouth 2 (two) times daily as needed for pain.      Marland Kitchen amoxicillin (AMOXIL) 500 MG capsule Take 2,000 mg (4 tablets) one hour prior to dental visits. 8 capsule 3  . apixaban (ELIQUIS) 5 MG TABS tablet Take 1 tablet (5 mg total) by mouth 2 (two) times daily. 180 tablet 1  . aspirin 81 MG chewable  tablet Chew 1 tablet (81 mg total) by mouth daily.      Marland Kitchen atorvastatin (LIPITOR) 80 MG tablet TAKE 1 TABLET BY MOUTH ONCE DAILY WITH  BREAKFAST 90 tablet 1  . colchicine 0.6 MG tablet TAKE 1 TABLET BY MOUTH TWICE DAILY 60 tablet 2  . diltiazem (CARDIZEM CD) 120 MG 24 hr capsule Take 1 capsule (120 mg total) by mouth daily. 90 capsule 1  . ergocalciferol (VITAMIN D2) 1.25 MG (50000 UT) capsule Take 50,000 Units by mouth once a week.      . spironolactone (ALDACTONE) 25 MG tablet Take 0.5 tablets (12.5 mg total) by mouth daily. 30 tablet 5  . torsemide (DEMADEX) 20 MG tablet Take 40 mg by mouth as needed. Use as needed if weight is over 245 pounds.          Drug Regimen Review Drug regimen was reviewed and remains appropriate with no significant issues identified   Home: Home Living Family/patient expects to be discharged to:: Private residence Living Arrangements: Spouse/significant other Available Help at Discharge: Family, Available 24 hours/day Type of Home: House Home Access: Stairs to enter Secretary/administrator of Steps: 3 Entrance Stairs-Rails: Right Home Layout: One level Bathroom Shower/Tub: Tub/shower unit, Health visitor: Handicapped height Bathroom Accessibility: Yes Home Equipment: Environmental consultant - 2 wheels, Cane - single point  Lives With: Spouse   Functional History: Prior Function Level of Independence: Independent Comments: community ambulator, drives, was actively going to [employer] and using the pool there   Functional Status:  Mobility: Bed Mobility Overal bed mobility: Needs Assistance Bed Mobility: Rolling Rolling: +2 for physical assistance, Max assist Supine to sit: +2 for physical assistance, HOB elevated, Max assist Sit to supine: +2 for physical assistance, Max assist General bed mobility comments: Two person max assist to roll bil, he was able to help more rolling toward his right side as he can better pull with his left hand.  His wife  provided the second person assist and reports she took care of her mother for years.  She seemed very comfortable in managing him physically.  Transfers Overall transfer level: Needs assistance Equipment used: Rolling walker (2 wheeled) Transfer via Lift Equipment: [employer] Transfers: Sit to/from Stand, [employer] Transfers Sit to Stand: Mod assist, Max assist Stand pivot transfers: Mod assist, Max assist General transfer comment: Assisted in returning pt to bed after being up for >1 hour.  Ambulation/Gait Ambulation/Gait assistance: Max assist Gait Distance (Feet): 4 Feet Assistive device: Rolling walker (2 wheeled) Gait Pattern/deviations: Decreased step length - right, Decreased step length - left, Decreased stride length General Gait Details: limited to 7-8 slow labored side steps due to BLE weakness Gait velocity: slow   ADL: ADL Overall ADL's : Needs assistance/impaired Eating/Feeding: Set up, Bed level Eating/Feeding Details (indicate cue  type and reason): pt reports having increased appetite today, reports no nausea Grooming: Min guard, Set up, Bed level, Wash/dry face, Sitting Grooming Details (indicate cue type and reason): pt with assist to apply lotion to back Upper Body Bathing: Minimal assistance, Sitting Lower Body Bathing: Maximal assistance, +2 for physical assistance, +2 for safety/equipment, Sitting/lateral leans Upper Body Dressing : Moderate assistance, Sitting Lower Body Dressing: +2 for physical assistance, +2 for safety/equipment, Sitting/lateral leans, Maximal assistance, Total assistance Lower Body Dressing Details (indicate cue type and reason): assist to don socks at bed level Toileting- Clothing Manipulation and Hygiene: Total assistance, +2 for physical assistance, +2 for safety/equipment, Bed level Functional mobility during ADLs: Maximal assistance, Total assistance, +2 for physical assistance, +2 for safety/equipment General ADL Comments: pt reports  increased fatigue today having had EGD earlier and transferring to new floors, agreeable to bed level therapy session   Cognition: Cognition Overall Cognitive Status: (not specifically tested, has to be reminded of L lean) Arousal/Alertness: Awake/alert Orientation Level: Oriented X4 Attention: Selective Selective Attention: Impaired Selective Attention Impairment: Verbal basic Memory: Impaired Memory Impairment: Storage deficit, Retrieval deficit, Decreased recall of new information Awareness: Impaired Awareness Impairment: Anticipatory impairment Cognition Arousal/Alertness: Awake/alert Behavior During Therapy: WFL for tasks assessed/performed Overall Cognitive Status: (not specifically tested, has to be reminded of L lean) Area of Impairment: Problem solving, Following commands Following Commands: Follows one step commands with increased time Problem Solving: Slow processing General Comments: pt is very sweet, pleasant and motivated to work with therapy   Physical Exam: Blood pressure 135/80, pulse 72, temperature (!) 97.5 F (36.4 C), temperature source Oral, resp. rate 20, height  (1.854 m), weight 126.9 kg, SpO2 99 %. Physical Exam  Constitutional: No distress.  obese  HENT:  Head: Normocephalic and atraumatic.  Poor dentition, missing multiple teeth  Eyes: Pupils are equal, round, and reactive to light. EOM are normal. Right eye exhibits no discharge. Left eye exhibits no discharge.  Neck: Normal range of motion. No thyromegaly present.  Cardiovascular: Normal rate and regular rhythm.  Murmur heard. Respiratory: Effort normal. No respiratory distress. He has no wheezes. He has no rales.  GI: He exhibits no distension. There is no abdominal tenderness. There is no rebound.  Musculoskeletal:        General: Edema present. Pain, swelling, warmth right great toe Neurological:  Pt awake, alert. Fair insight and awareness. Oriented to month, year. Decreased STM. Normal  language. Follows all basic commands. RUE grossly 3/5 prox to distal. LUE 3+ to 4/5 prox to distal. RLE 2+ to 3/5 prox to distal. LLE 3- to 4/5 with inhibition to pain at left great toe. No focal sensory deficits.  Skin: Skin is warm and dry. He is not diaphoretic.  Psychiatric: He has a normal mood and affect. His behavior is normal.      Lab Results Last 48 Hours        Results for orders placed or performed during the hospital encounter of 01/13/19 (from the past 48 hour(s))  CBC     Status: Abnormal    Collection Time: 01/29/19  5:03 AM  Result Value Ref Range    WBC 14.4 (H) 4.0 - 10.5 K/uL    RBC 3.52 (L) 4.22 - 5.81 MIL/uL    Hemoglobin 9.4 (L) 13.0 - 17.0 g/dL    HCT 16.1 (L) 09.6 - 52.0 %    MCV 82.7 80.0 - 100.0 fL    MCH 26.7 26.0 - 34.0 pg  MCHC 32.3 30.0 - 36.0 g/dL    RDW 25.3 (H) 66.4 - 15.5 %    Platelets 125 (L) 150 - 400 K/uL      Comment: REPEATED TO VERIFY    nRBC 0.0 0.0 - 0.2 %      Comment: Performed at [facility], [address], [place], [name] [address]  Basic metabolic panel     Status: Abnormal    Collection Time: 01/29/19  5:03 AM  Result Value Ref Range    Sodium 135 135 - 145 mmol/L    Potassium 3.3 (L) 3.5 - 5.1 mmol/L    Chloride 108 98 - 111 mmol/L    CO2 20 (L) 22 - 32 mmol/L    Glucose, Bld 113 (H) 70 - 99 mg/dL    BUN 13 8 - 23 mg/dL    Creatinine, Ser 4.25 0.61 - 1.24 mg/dL    Calcium 7.7 (L) 8.9 - 10.3 mg/dL    GFR calc non Af Amer >60 >60 mL/min    GFR calc Af Amer >60 >60 mL/min    Anion gap 7 5 - 15      Comment: Performed at [facility], [address], [place], [name] [address]      Imaging Results (Last 48 hours)  No results found.           Medical Problem List and Plan: 1.   Persistent right side weakness with decreased functional mobility secondary to  cardioembolic bi-cerebral infarctions             -admit to inpatient rehab 2.  Antithrombotics: -DVT/anticoagulation:  SCDs.Dopplers negative for  DVT                                     -eliquis on hold due to GIB             -antiplatelet therapy: ASA on hold d/t GIB 3. Pain Management: Tylenol as needed 4. Mood:  Provide emotional support             -antipsychotic agents: None 5. Neuropsych: This patient is capable of making decisions on his own behalf. 6. Skin/Wound Care:  Routine skin checks 7. Fluids/Electrolytes/Nutrition:  Routine in and out's with follow-up chemistries 8.  Right lower extremity cellulitis with sepsis/Streptococcus bacteremia. Intravenous Ancef completed 01/30/2019 9. Upper GI bleed. EGD 3. Follow-up gastroenterology services. Continue PPI. WILL DISCUSS PLAN ON TIMING TO RESUME ANTICOAGULATION. 10. Acute blood loss anemia. Patient has been transfused to do 6 units pack red blood cells. Follow-up CBC. 11. Chronic atrial fibrillation as well as history of TAVR procedure. Diltiazem 120 mg daily.Cardiac rate controlled. 12. Hypertension. Aldactone 12.5 mg daily. Monitor with increased mobility 13. Hyperlipidemia. Lipitor 14. OSA. CPAP 15. Gout left great toe: prednisone 10mg  bid x 4 doses         Charlton Amor, PA-C 01/30/2019  Post Admission Physician Evaluation: 1. Functional deficits secondary  to bi-cerebral infarct. 2. Patient is admitted to receive collaborative, interdisciplinary care between the physiatrist, rehab nursing staff, and therapy team. 3. Patient's level of medical complexity and substantial therapy needs in context of that medical necessity cannot be provided at a lesser intensity of care such as a SNF. 4. Patient has experienced substantial functional loss from his/her baseline which was documented above under the "Functional History" and "Functional Status" headings.  Judging by the patient's diagnosis,  physical exam, and functional history, the patient has potential for functional progress which will result in measurable gains while on inpatient rehab.  These gains will be of  substantial and practical use upon discharge  in facilitating mobility and self-care at the household level. 5. Physiatrist will provide 24 hour management of medical needs as well as oversight of the therapy plan/treatment and provide guidance as appropriate regarding the interaction of the two. 6. The Preadmission Screening has been reviewed and patient status is unchanged unless otherwise stated above. 7. 24 hour rehab nursing will assist with bladder management, bowel management, safety, skin/wound care, disease management, medication administration, pain management and patient education  and help integrate therapy concepts, techniques,education, etc. 8. PT will assess and treat for/with: Lower extremity strength, range of motion, stamina, balance, functional mobility, safety, adaptive techniques and equipment, NMR, family education.   Goals are: supervision to min assist. 9. OT will assess and treat for/with: ADL's, functional mobility, safety, upper extremity strength, adaptive techniques and equipment, NMR, family ed, community reentry.   Goals are: supervision to min assist. Therapy may proceed with showering this patient. 10. SLP will assess and treat for/with: cognition, communication.  Goals are: supervision to mod I. 11. Case Management and Social Worker will assess and treat for psychological issues and discharge planning. 12. Team conference will be held weekly to assess progress toward goals and to determine barriers to discharge. 13. Patient will receive at least 3 hours of therapy per day at least 5 days per week. 14. ELOS: 12-16 days       15. Prognosis:  excellent   I have personally performed a face to face diagnostic evaluation of this patient and formulated the key components of the plan.  Additionally, I have personally reviewed laboratory data, imaging studies, as well as relevant notes and concur with the physician assistant's documentation above.  Ranelle OysterZachary T. Onna Nodal, MD,  [place] DomFAAPMR

## 2019-01-30 NOTE — Plan of Care (Signed)
  Problem: Health Behavior/Discharge Planning: Goal: Ability to manage health-related needs will improve Outcome: Progressing   Problem: Clinical Measurements: Goal: Respiratory complications will improve Outcome: Progressing   Problem: Clinical Measurements: Goal: Cardiovascular complication will be avoided Outcome: Progressing   Problem: Nutrition: Goal: Adequate nutrition will be maintained Outcome: Progressing   Problem: Coping: Goal: Level of anxiety will decrease Outcome: Progressing

## 2019-01-30 NOTE — Progress Notes (Signed)
Occupational Therapy Treatment Patient Details Name: Joel Rogers MRN: [id number] DOB: 1951/06/15 Today's Date: 01/30/2019    History of present illness Pt is a 68 y/o male admitted 01/13/19 (originally to [facility]) secondary to R UE and R LE pain with sepsis due to streptococcus bacteremia and cellulitis. During admission, pt with persistent R sided weakness. An MRI of the brain revealed multiple acute ischemic infarcts in a pattern that is most consistent with cardioembolic strokes and pt was transferred to [facility] for further care.  Pt's stay further complicated by hematemesis who underwent upper GI endoscopy on 01/21/19 and 2 units of PRBCs due to ABLA.  PMH including but not limited to atrial fibrillation on anticoagulation, CHF, history of subdural hematoma, HLD, HTN, TAVR placement in 9/19 and sleep apnea.   OT comments  Pt making progress toward goals. Very motivated. Improved midline postural control EOB and increasing independence with self care. Continue to recommend extensive rehab at [facility]. Wife present for session.   Follow Up Recommendations  CIR;Supervision/Assistance - 24 hour    Equipment Recommendations  Other (comment)(TBA)    Recommendations for Other Services Rehab consult    Precautions / Restrictions Precautions Precautions: Fall Precaution Comments: R sided weakness and R hip/thigh pain       Mobility Bed Mobility Overal bed mobility: Needs Assistance Bed Mobility: Rolling;Sidelying to Sit Rolling: +2 for physical assistance;Max assist Sidelying to sit: Max assist;+2 for physical assistance          Transfers                      Balance     Sitting balance-Leahy Scale: Fair Sitting balance - Comments: L bias                                   ADL either performed or assessed with clinical judgement   ADL       Grooming: Set up;Sitting;Supervision/safety   Upper Body Bathing: Minimal assistance;Sitting    Lower Body Bathing: Moderate assistance;Sitting/lateral leans   Upper Body Dressing : Moderate assistance;Sitting   Lower Body Dressing: Total assistance               Functional mobility during ADLs: +2 for physical assistance;Total assistance(with use of Maximove) General ADL Comments: attempted to use Geralyn Corwin unsuccesfully due to hip width; focus of midline trunk control and weight shifts during ADL; facilitating use of RUE     Vision   Additional Comments: Will further assess   Perception     Praxis      Cognition Arousal/Alertness: Awake/alert Behavior During Therapy: WFL for tasks assessed/performed Overall Cognitive Status: Impaired/Different from baseline Area of Impairment: Awareness;Problem solving;Safety/judgement                       Following Commands: Follows one step commands consistently Safety/Judgement: Decreased awareness of deficits Awareness: Emergent Problem Solving: Slow processing          Exercises General Exercises - Upper Extremity Digit Composite Flexion: AROM;Strengthening;Right;10 reps Composite Extension: AROM;Strengthening;Right;10 reps;Seated   Shoulder Instructions       General Comments      Pertinent Vitals/ Pain       Pain Assessment: Faces Faces Pain Scale: Hurts even more Pain Location: R LE with ROM Pain Descriptors / Indicators: Guarding;Sore Pain Intervention(s): Limited activity within patient's tolerance  Home Living  Prior Functioning/Environment              Frequency  Min 2X/week        Progress Toward Goals  OT Goals(current goals can now be found in the care plan section)  Progress towards OT goals: Progressing toward goals  Acute Rehab OT Goals Patient Stated Goal: to be free OT Goal Formulation: With patient Time For Goal Achievement: 02/03/19 Potential to Achieve Goals: Good ADL Goals Pt Will Perform Grooming:  sitting;with min guard assist Pt Will Perform Upper Body Bathing: with min guard assist;sitting Pt Will Perform Upper Body Dressing: with min guard assist;sitting Pt/caregiver will Perform Home Exercise Program: Increased strength;Increased ROM;Right Upper extremity;With minimal assist Additional ADL Goal #1: Pt will perform bed mobility with modA as precursor to EOB/OOB ADL. Additional ADL Goal #2: Pt will maintain sitting balance EOB with minguard assist during ADL/functional task.  Plan Discharge plan remains appropriate    Co-evaluation    PT/OT/SLP Co-Evaluation/Treatment: Yes Reason for Co-Treatment: Complexity of the patient's impairments (multi-system involvement);For patient/therapist safety;To address functional/ADL transfers   OT goals addressed during session: ADL's and self-care;Strengthening/ROM      AM-PAC OT "6 Clicks" Daily Activity     Outcome Measure   Help from another person eating meals?: A Little Help from another person taking care of personal grooming?: A Little Help from another person toileting, which includes using toliet, bedpan, or urinal?: Total Help from another person bathing (including washing, rinsing, drying)?: A Lot Help from another person to put on and taking off regular upper body clothing?: A Lot Help from another person to put on and taking off regular lower body clothing?: A Lot 6 Click Score: 13    End of Session    OT Visit Diagnosis: Muscle weakness (generalized) (M62.81);Other abnormalities of gait and mobility (R26.89);Pain;Hemiplegia and hemiparesis Hemiplegia - Right/Left: Right Hemiplegia - dominant/non-dominant: Dominant Hemiplegia - caused by: Cerebral infarction Pain - Right/Left: Right Pain - part of body: Hip   Activity Tolerance Patient tolerated treatment well   Patient Left in chair;with call bell/phone within reach;with chair alarm set   Nurse Communication Mobility status;Need for lift equipment        Time:  9292-4462 OT Time Calculation (min): 41 min  Charges: OT General Charges $OT Visit: 1 Visit OT Treatments $Self Care/Home Management : 8-22 mins  Luisa Dago, OT/L   Acute OT Clinical Specialist Acute Rehabilitation Services Pager [phone] Office [phone]    [facility] 01/30/2019, 12:49 PM

## 2019-01-30 NOTE — Progress Notes (Signed)
RT brought CPAP to patients room.  Patients states he does not have to wear one anymore because of a previous procedure.  Patient vitals stable.

## 2019-01-30 NOTE — Progress Notes (Signed)
Pt's wife notified by telephone of bx results and apparent intolerance to Kayexalate.  I see where it has been listed as an allergy on pt's chart.  Florencia Reasons, M.D. Pager [phone] If no answer or after 5 PM call [phone]

## 2019-01-31 ENCOUNTER — Inpatient Hospital Stay (HOSPITAL_COMMUNITY): Payer: Self-pay | Admitting: Occupational Therapy

## 2019-01-31 ENCOUNTER — Inpatient Hospital Stay (HOSPITAL_COMMUNITY): Payer: Self-pay

## 2019-01-31 ENCOUNTER — Inpatient Hospital Stay (HOSPITAL_COMMUNITY): Payer: Self-pay | Admitting: Physical Therapy

## 2019-01-31 DIAGNOSIS — G8389 Other specified paralytic syndromes: Secondary | ICD-10-CM

## 2019-01-31 DIAGNOSIS — I6389 Other cerebral infarction: Secondary | ICD-10-CM

## 2019-01-31 LAB — COMPREHENSIVE METABOLIC PANEL
ALT: 6 U/L (ref 0–44)
AST: 24 U/L (ref 15–41)
Albumin: 1.7 g/dL — ABNORMAL LOW (ref 3.5–5.0)
Alkaline Phosphatase: 79 U/L (ref 38–126)
Anion gap: 3 — ABNORMAL LOW (ref 5–15)
BUN: 11 mg/dL (ref 8–23)
CO2: 26 mmol/L (ref 22–32)
Calcium: 8.2 mg/dL — ABNORMAL LOW (ref 8.9–10.3)
Chloride: 108 mmol/L (ref 98–111)
Creatinine, Ser: 0.93 mg/dL (ref 0.61–1.24)
GFR calc Af Amer: >60 mL/min (ref >60)
GFR calc non Af Amer: >60 mL/min (ref >60)
Glucose, Bld: 106 mg/dL — ABNORMAL HIGH (ref 70–99)
Potassium: 3.5 mmol/L (ref 3.5–5.1)
SODIUM: 137 mmol/L (ref 135–145)
Total Bilirubin: 1 mg/dL (ref 0.3–1.2)
Total Protein: 5.6 g/dL — ABNORMAL LOW (ref 6.5–8.1)

## 2019-01-31 LAB — CBC WITH DIFFERENTIAL/PLATELET
Abs Immature Granulocytes: 0.07 10*3/uL (ref 0.00–0.07)
Basophils Absolute: 0 10*3/uL (ref 0.0–0.1)
Basophils Relative: 0 %
EOS PCT: 0 %
Eosinophils Absolute: 0 10*3/uL (ref 0.0–0.5)
HCT: 27.2 % — ABNORMAL LOW (ref 39.0–52.0)
Hemoglobin: 8.8 g/dL — ABNORMAL LOW (ref 13.0–17.0)
Immature Granulocytes: 1 %
LYMPHS PCT: 8 %
Lymphs Abs: 1.1 10*3/uL (ref 0.7–4.0)
MCH: 26.4 pg (ref 26.0–34.0)
MCHC: 32.4 g/dL (ref 30.0–36.0)
MCV: 81.7 fL (ref 80.0–100.0)
Monocytes Absolute: 1.3 10*3/uL — ABNORMAL HIGH (ref 0.1–1.0)
Monocytes Relative: 9 %
NEUTROS ABS: 12.2 10*3/uL — AB (ref 1.7–7.7)
Neutrophils Relative %: 82 %
Platelets: 130 10*3/uL — ABNORMAL LOW (ref 150–400)
RBC: 3.33 MIL/uL — ABNORMAL LOW (ref 4.22–5.81)
RDW: 20.8 % — ABNORMAL HIGH (ref 11.5–15.5)
WBC: 14.8 10*3/uL — ABNORMAL HIGH (ref 4.0–10.5)
nRBC: 0 % (ref 0.0–0.2)

## 2019-01-31 MED ORDER — TRAZODONE HCL 50 MG PO TABS
25.0000 mg | ORAL_TABLET | Freq: Every evening | ORAL | Status: DC | PRN
  Administered 2019-02-01 – 2019-02-02 (×3): 50 mg via ORAL
  Filled 2019-01-31 (×3): qty 1

## 2019-01-31 MED ORDER — SODIUM CHLORIDE 0.9% FLUSH
10.0000 mL | INTRAVENOUS | Status: DC | PRN
  Administered 2019-01-31: 10 mL
  Administered 2019-01-31: 20 mL
  Administered 2019-02-01: 10 mL
  Filled 2019-01-31 (×2): qty 40

## 2019-01-31 MED ORDER — CHLORHEXIDINE GLUCONATE CLOTH 2 % EX PADS
6.0000 | MEDICATED_PAD | Freq: Every day | CUTANEOUS | Status: DC
  Administered 2019-02-01 – 2019-02-03 (×3): 6 via TOPICAL

## 2019-01-31 NOTE — Care Management Note (Signed)
Inpatient Rehabilitation Center Individual Statement of Services  Patient Name:  Joel Rogers  Date:  01/31/2019  Welcome to the Inpatient Rehabilitation Center.  Our goal is to provide you with an individualized program based on your diagnosis and situation, designed to meet your specific needs.  With this comprehensive rehabilitation program, you will be expected to participate in at least 3 hours of rehabilitation therapies Monday-Friday, with modified therapy programming on the weekends.  Your rehabilitation program will include the following services:  Physical Therapy (PT), Occupational Therapy (OT), Speech Therapy (ST), 24 hour per day rehabilitation nursing, Case Management (Social Worker), Rehabilitation Medicine, Nutrition Services and Pharmacy Services  Weekly team conferences will be held on Wednesday to discuss your progress.  Your Social Worker will talk with you frequently to get your input and to update you on team discussions.  Team conferences with you and your family in attendance may also be held.  Expected length of stay: 3-4 weeks Overall anticipated outcome: min assist with ADL's and transfer's no ambulation goals as of yet  Depending on your progress and recovery, your program may change. Your Social Worker will coordinate services and will keep you informed of any changes. Your Social Worker's name and contact numbers are listed  below.  The following services may also be recommended but are not provided by the Inpatient Rehabilitation Center:   Driving Evaluations  Home Health Rehabiltiation Services  Outpatient Rehabilitation Services  Vocational Rehabilitation   Arrangements will be made to provide these services after discharge if needed.  Arrangements include referral to agencies that provide these services.  Your insurance has been verified to be:  [employer]-Medicare Your primary doctor is:  [facility] [employer]  Pertinent information will be shared with your doctor  and your insurance company.  Social Worker:  Dossie Der, SW [phone] or [phone]  Information discussed with and copy given to patient by: Lucy Chris, 01/31/2019, 10:08 AM

## 2019-01-31 NOTE — Evaluation (Signed)
Speech Language Pathology Assessment and Plan  Patient Details  Name: Joel Rogers MRN: [id number] Date of Birth: 1951-08-21  SLP Diagnosis: Cognitive Impairments  Rehab Potential: Good ELOS: 12-16 days     Today's Date: 01/31/2019 SLP Individual Time: 1105-1200 SLP Individual Time Calculation (min): 55 min   Problem List:  Patient Active Problem List   Diagnosis Date Noted  . Acute bilateral cerebral infarction in a watershed distribution [facility]) 01/30/2019  . UGIB (upper gastrointestinal bleed) 01/26/2019  . Bacteremia due to Streptococcus 01/26/2019  . Cellulitis 01/26/2019  . Upper GI bleed   . Acute CVA (cerebrovascular accident) ([place]) 01/19/2019  . Acute renal failure ([place]) 01/14/2019  . Right shoulder pain 01/14/2019  . Sepsis ([place]) 01/13/2019  . S/P TAVR (transcatheter aortic valve replacement) 07/25/2018  . History of subdural hematoma   . Atrial fibrillation, chronic   . Acute on chronic diastolic heart failure ([place]) 06/15/2018  . Pulmonary hypertension, unspecified ([place]) 02/15/2018  . Varicose veins of right lower extremity with complications 81/85/6314  . History of Severe aortic stenosis 08/09/2017  . Vitamin D deficiency 12/02/2016  . Status post gastric bypass for obesity 11/02/2016  . HTN (hypertension) 04/13/2013  . Hyperlipemia 04/13/2013  . Chronic atrial fibrillation 02/14/2013  . Morbid obesity ([place]) 06/18/2008  . Obstructive sleep apnea 06/18/2008   Past Medical History:  Past Medical History:  Diagnosis Date  . Anemia    low iron  . Arthritis    bilateral knees  . Atrial fibrillation, chronic   . Chronic diastolic CHF (congestive heart failure) ([place]) 06/15/2018  . History of subdural hematoma   . Hyperlipidemia   . Hypertension   . Morbid obesity ([place])   . Normal coronary arteries    by cardiac catheterization performed 03/14/06  . Pre-diabetes    pt denies  . S/P TAVR (transcatheter aortic valve replacement)    a. 07/25/18: Edwards  Sapien 3 THV (size 26 mm, model # 9600TFX, serial # [id number]) by Dr. Cyndia Rogers and Dr. Angelena Rogers  . Sleep apnea    on CPAP  . Venous insufficiency    Past Surgical History:  Past Surgical History:  Procedure Laterality Date  . BIOPSY  01/26/2019   Procedure: BIOPSY;  Surgeon: Joel Lobo, MD;  Location: [facility] ENDOSCOPY;  Service: Endoscopy;;  . CRANIOTOMY Right 08/27/2016   Procedure: CRANIOTOMY HEMATOMA EVACUATION SUBDURAL;  Surgeon: Joel Levine, MD;  Location: [place];  Service: Neurosurgery;  Laterality: Right;  . ESOPHAGOGASTRODUODENOSCOPY N/A 01/21/2019   Procedure: ESOPHAGOGASTRODUODENOSCOPY (EGD);  Surgeon: Joel Essex, MD;  Location: [place];  Service: Endoscopy;  Laterality: N/A;  . ESOPHAGOGASTRODUODENOSCOPY (EGD) WITH PROPOFOL N/A 01/22/2019   Procedure: ESOPHAGOGASTRODUODENOSCOPY (EGD) WITH PROPOFOL;  Surgeon: Joel Lobo, MD;  Location: [place];  Service: Endoscopy;  Laterality: N/A;  . ESOPHAGOGASTRODUODENOSCOPY (EGD) WITH PROPOFOL N/A 01/26/2019   Procedure: ESOPHAGOGASTRODUODENOSCOPY (EGD) WITH PROPOFOL;  Surgeon: Joel Lobo, MD;  Location: [place];  Service: Endoscopy;  Laterality: N/A;  . GASTRIC BYPASS  09/2008  . JOINT REPLACEMENT  08/31/2006   right hip  . MULTIPLE EXTRACTIONS WITH ALVEOLOPLASTY N/A 07/13/2018   Procedure: Extraction of tooth #'s 3,8,10, 23-26, 30and 32 with alveoloplasty and gross debridement of remaining teeth;  Surgeon: Joel Rogers, DDS;  Location: [place];  Service: Oral Surgery;  Laterality: N/A;  . RIGHT HEART CATH N/A 07/06/2018   Procedure: RIGHT HEART CATH;  Surgeon: Joel Blanks, MD;  Location: [place] [facility];  Service: Cardiovascular;  Laterality: N/A;  . RIGHT/LEFT HEART  CATH AND CORONARY ANGIOGRAPHY N/A 07/10/2018   Procedure: RIGHT/LEFT HEART CATH AND CORONARY ANGIOGRAPHY;  Surgeon: Joel Artist, MD;  Location: [place] [facility];  Service: Cardiovascular;  Laterality: N/A;  . TEE WITHOUT CARDIOVERSION  N/A 07/25/2018   Procedure: TRANSESOPHAGEAL ECHOCARDIOGRAM (TEE);  Surgeon: Joel Blanks, MD;  Location: [place];  Service: Open Heart Surgery;  Laterality: N/A;  . TEE WITHOUT CARDIOVERSION N/A 01/18/2019   Procedure: TRANSESOPHAGEAL ECHOCARDIOGRAM (TEE) WITH PROPOFOL;  Surgeon: Joel Sark, MD;  Location: [facility];  Service: Cardiovascular;  Laterality: N/A;  . TOTAL HIP ARTHROPLASTY     right hip  . TRANSCATHETER AORTIC VALVE REPLACEMENT, TRANSFEMORAL  07/25/2018  . TRANSCATHETER AORTIC VALVE REPLACEMENT, TRANSFEMORAL N/A 07/25/2018   Procedure: TRANSCATHETER AORTIC VALVE REPLACEMENT, TRANSFEMORAL;  Surgeon: Joel Blanks, MD;  Location: [place];  Service: Open Heart Surgery;  Laterality: N/A;    Assessment / Plan / Recommendation Clinical Impression Joel Rogers is a 68 year old right-handed male with history of diastolic congestive heart failure, aortic stenosis status post TAVR September 2019, atrial fibrillation maintained on Eliquis,OSA-CPAP, hypertension. Per chart review lives with spouse. Community ambulator active and driving. One level home with 3 steps to entry.Presented [facility] hospital 01/13/2019 secondary to right and left upper extremity pain with recent fall and right side weakness and findings of sepsis due to Staphylococcus bacteremia and cellulitis.during admission maintain on antibiotic therapy. Noted persistent right side weakness. MRI of the brain revealed multiple acute ischemic infarctions in a pattern most consistent with cardioembolic stroke. Patient was transferred to [facility] for further evaluation. Venous Doppler studies showed no signs of DVT. Findings of elevated liver function studies ultrasound the abdomen showed no focal lesion identified. Follow-up MRI of the brain with numerous scattered foci of acute ischemia throughout the cerebrum and cerebellum. Many of these lesions suggestive of embolic pattern. No midline shift or mass  effect. No proximal intracranial arterial occlusion. Echocardiogram with ejection fraction of 27% normal systolic function. TEE completed showing no vegetation on bioprosthetic aortic valve. Patient remained on eliquis for atrial fibrillation with the addition of aspirin for CVA prophylaxis. Patient developed coffee-ground emesis with acute blood loss anemia requiring transfusions totaling 6 units units pack red blood cells and gastroenterology consulted EGD completed 2 with findings of red blood in the lower third of the esophagus clotted blood in the entire stomach. Blood thinners were placed on hold patient remained NPO with follow-up upper GI endoscopy again completed 01/26/2019 Dr Cristina Gong showing no active bleeding or blood present and appeared to show some resolving ischemic changes in the region of the gastric pouch and the anastomosis. His diet was slowly advanced. Patient's aspirin and Eliquis currently remains on hold and await plan to resume and possibly 3-5 days. It was recommended to continue Protonix twice daily. Hospital course AKI with creatinine baseline 1.1-1.23 elevated to 1.95 with renal services consulted 01/23/2019. AKI likely secondary to ischemic ATN in the setting of sepsis. Maintain on gentle IV fluids and creatinine improved 1.00. Patient is completing a course of Ancef for sepsis. Therapy evaluations have been completed with recommendations of physical medicine rehabilitation consult. Patient was admitted for a comprehensive rehabilitation program.  Pt presents with mild-moderate higher level cognitive impairments, deficits include short term memory, semi-complex problem solving and emergent awareness, which was further supported by formal cognitive linguistic assessment utilizing Cognistat (moderate impairment on memory and mental calcuation tasks, mild impairment on construction task, and all other areas [facility].) Pt demonstrates ability to  express complex thought, following multiple  step directions, and emerging alternating attention. Pt expressed minor recall baseline deficits. Pt and Pt's wife, Kennyth Lose, initially denied cognitive changes from CVAs, however agreeable to changes, as well as need for skilled ST services, following conclusion of formal cognitive lignistic assessment. Pt would benefit from skilled ST services in order to maximize functional independence and reduce burden of care, likely requiring supervision at discharge with continued skilled ST services.    Skilled Therapeutic Interventions          Skilled ST services focused on cognitive skills. SLP facilitated administration of cognitive linguistic formal assessment and provided education of results. SLP and pt colaborated to set goals for cognitive linguistic needs during length of stay. All questions answered to satisfaction.  Pt was left in room with call bell within reach, wife present and bed alarm set. SLP reccomends to continue skilled services.   SLP Assessment  Patient will need skilled Speech Lanaguage Pathology Services during CIR admission    Recommendations  SLP Diet Recommendations: Other (Comment)(soft) Liquid Administration via: Cup;Straw Medication Administration: Whole meds with liquid Supervision: Patient able to self feed Compensations: Minimize environmental distractions Postural Changes and/or Swallow Maneuvers: Seated upright 90 degrees Oral Care Recommendations: Oral care QID Patient destination: Home Follow up Recommendations: 24 hour supervision/assistance;Home Health SLP Equipment Recommended: None recommended by SLP    SLP Frequency 3 to 5 out of 7 days   SLP Duration  SLP Intensity  SLP Treatment/Interventions 12-16 days   Minumum of 1-2 x/day, 30 to 90 minutes  Cueing hierarchy;Functional tasks;Cognitive remediation/compensation;Medication managment;Patient/family education;Internal/external aids    Pain Pain Assessment Pain Scale: 0-10 Pain Score: 0-No pain Pain  Type: Acute pain Pain Orientation: Right Pain Descriptors / Indicators: Aching Pain Intervention(s): Medication (See eMAR)  Prior Functioning Cognitive/Linguistic Baseline: Baseline deficits Baseline deficit details: pt describes mild memory impairments but no one present to confirm baseline abililties Type of Home: House  Lives With: Spouse Available Help at Discharge: Family;Available 24 hours/day Vocation: Retired  Industrial/product designer Term Goals: Week 1: SLP Short Term Goal 1 (Week 1): Pt will utilize compensatory and external memory aids to recall, new daily information with min A verbal cues. SLP Short Term Goal 2 (Week 1): Pt will complete semi-complex tasks with supervision A verbal cues for functional problem solving.  SLP Short Term Goal 3 (Week 1): Pt will self-monitor and self-correct functional errors in problem solving tasks with supervision A verbal cues.   Refer to Care Plan for Long Term Goals  Recommendations for other services: None   Discharge Criteria: Patient will be discharged from SLP if patient refuses treatment 3 consecutive times without medical reason, if treatment goals not met, if there is a change in medical status, if patient makes no progress towards goals or if patient is discharged from hospital.  The above assessment, treatment plan, treatment alternatives and goals were discussed and mutually agreed upon: by patient and by family  Pragya Lofaso  [facility] 01/31/2019, 4:37 PM

## 2019-01-31 NOTE — Progress Notes (Signed)
Patient refused CPAP.

## 2019-01-31 NOTE — Evaluation (Signed)
Physical Therapy Assessment and Plan  Patient Details  Name: Joel Rogers MRN: [id number] Date of Birth: 28-Dec-1950  PT Diagnosis: Abnormality of gait, Cognitive deficits, Coordination disorder, Difficulty walking, Edema, Hemiparesis dominant, Impaired cognition, Impaired sensation, Muscle weakness and Pain in RLE Rehab Potential: Fair ELOS: 3-4 weeks   Today's Date: 01/31/2019 PT Individual Time: 4259-5638 PT Individual Time Calculation (min): 71 min    Problem List:  Patient Active Problem List   Diagnosis Date Noted  . Acute bilateral cerebral infarction in a watershed distribution [facility]) 01/30/2019  . UGIB (upper gastrointestinal bleed) 01/26/2019  . Bacteremia due to Streptococcus 01/26/2019  . Cellulitis 01/26/2019  . Upper GI bleed   . Acute CVA (cerebrovascular accident) ([place]) 01/19/2019  . Acute renal failure ([place]) 01/14/2019  . Right shoulder pain 01/14/2019  . Sepsis ([place]) 01/13/2019  . S/P TAVR (transcatheter aortic valve replacement) 07/25/2018  . History of subdural hematoma   . Atrial fibrillation, chronic   . Acute on chronic diastolic heart failure ([place]) 06/15/2018  . Pulmonary hypertension, unspecified ([place]) 02/15/2018  . Varicose veins of right lower extremity with complications 75/64/3329  . History of Severe aortic stenosis 08/09/2017  . Vitamin D deficiency 12/02/2016  . Status post gastric bypass for obesity 11/02/2016  . HTN (hypertension) 04/13/2013  . Hyperlipemia 04/13/2013  . Chronic atrial fibrillation 02/14/2013  . Morbid obesity ([place]) 06/18/2008  . Obstructive sleep apnea 06/18/2008    Past Medical History:  Past Medical History:  Diagnosis Date  . Anemia    low iron  . Arthritis    bilateral knees  . Atrial fibrillation, chronic   . Chronic diastolic CHF (congestive heart failure) ([place]) 06/15/2018  . History of subdural hematoma   . Hyperlipidemia   . Hypertension   . Morbid obesity ([place])   . Normal coronary arteries    by  cardiac catheterization performed 03/14/06  . Pre-diabetes    pt denies  . S/P TAVR (transcatheter aortic valve replacement)    a. 07/25/18: Edwards Sapien 3 THV (size 26 mm, model # 9600TFX, serial # [id number]) by Dr. Cyndia Bent and Dr. Angelena Form  . Sleep apnea    on CPAP  . Venous insufficiency    Past Surgical History:  Past Surgical History:  Procedure Laterality Date  . BIOPSY  01/26/2019   Procedure: BIOPSY;  Surgeon: Ronald Lobo, MD;  Location: [facility] END[facility]COPY;  Service: Endoscopy;;  . CRANIOTOMY Right 08/27/2016   Procedure: CRANIOTOMY HEMATOMA EVACUATION SUBDURAL;  Surgeon: Erline Levine, MD;  Location: [place];  Service: Neurosurgery;  Laterality: Right;  . ESOPHAGOGASTRODUODENOSCOPY N/A 01/21/2019   Procedure: ESOPHAGOGASTRODUODENOSCOPY (EGD);  Surgeon: Clarene Essex, MD;  Location: [place];  Service: Endoscopy;  Laterality: N/A;  . ESOPHAGOGASTRODUODENOSCOPY (EGD) WITH PROPOFOL N/A 01/22/2019   Procedure: ESOPHAGOGASTRODUODENOSCOPY (EGD) WITH PROPOFOL;  Surgeon: Ronald Lobo, MD;  Location: [place];  Service: Endoscopy;  Laterality: N/A;  . ESOPHAGOGASTRODUODENOSCOPY (EGD) WITH PROPOFOL N/A 01/26/2019   Procedure: ESOPHAGOGASTRODUODENOSCOPY (EGD) WITH PROPOFOL;  Surgeon: Ronald Lobo, MD;  Location: [place];  Service: Endoscopy;  Laterality: N/A;  . GASTRIC BYPASS  09/2008  . JOINT REPLACEMENT  08/31/2006   right hip  . MULTIPLE EXTRACTIONS WITH ALVEOLOPLASTY N/A 07/13/2018   Procedure: Extraction of tooth #'s 3,8,10, 23-26, 30and 32 with alveoloplasty and gross debridement of remaining teeth;  Surgeon: Lenn Cal, DDS;  Location: [place];  Service: Oral Surgery;  Laterality: N/A;  . RIGHT HEART CATH N/A 07/06/2018   Procedure: RIGHT HEART CATH;  Surgeon: Lauree Chandler  D, MD;  Location: [place] [facility];  Service: Cardiovascular;  Laterality: N/A;  . RIGHT/LEFT HEART CATH AND CORONARY ANGIOGRAPHY N/A 07/10/2018   Procedure: RIGHT/LEFT HEART CATH AND CORONARY  ANGIOGRAPHY;  Surgeon: Jolaine Artist, MD;  Location: [place] [facility];  Service: Cardiovascular;  Laterality: N/A;  . TEE WITHOUT CARDIOVERSION N/A 07/25/2018   Procedure: TRANSESOPHAGEAL ECHOCARDIOGRAM (TEE);  Surgeon: [place] Blanks, MD;  Location: [place];  Service: Open Heart Surgery;  Laterality: N/A;  . TEE WITHOUT CARDIOVERSION N/A 01/18/2019   Procedure: TRANSESOPHAGEAL ECHOCARDIOGRAM (TEE) WITH PROPOFOL;  Surgeon: Satira Sark, MD;  Location: [facility];  Service: Cardiovascular;  Laterality: N/A;  . TOTAL HIP ARTHROPLASTY     right hip  . TRANSCATHETER AORTIC VALVE REPLACEMENT, TRANSFEMORAL  07/25/2018  . TRANSCATHETER AORTIC VALVE REPLACEMENT, TRANSFEMORAL N/A 07/25/2018   Procedure: TRANSCATHETER AORTIC VALVE REPLACEMENT, TRANSFEMORAL;  Surgeon: [place] Blanks, MD;  Location: [place];  Service: Open Heart Surgery;  Laterality: N/A;    Assessment & Plan Clinical Impression: Patient is a 68 y.o. year old male right-handed male with history of diastolic congestive heart failure, aortic stenosis status post TAVR September 2019, atrial fibrillation maintained on Eliquis,OSA-CPAP, hypertension. Per chart review lives with spouse. Community ambulator active and driving. One level home with 3 steps to entry.Presented [facility] hospital 01/13/2019 secondary to right and left upper extremity pain with recent fall and right side weakness and findings of sepsis due to Staphylococcus bacteremia and cellulitis.during admission maintain on antibiotic therapy. Noted persistent right side weakness. MRI of the brain revealed multiple acute ischemic infarctions in a pattern most consistent with cardioembolic stroke. Patient was transferred to [facility] for further evaluation. Venous Doppler studies showed no signs of DVT. Findings of elevated liver function studies ultrasound the abdomen showed no focal lesion identified. Follow-up MRI of the brain with numerous scattered foci of  acute ischemia throughout the cerebrum and cerebellum. Many of these lesions suggestive of embolic pattern. No midline shift or mass effect. No proximal intracranial arterial occlusion. Echocardiogram with ejection fraction of 14% normal systolic function. TEE completed showing no vegetation on bioprosthetic aortic valve. Patient remained on eliquis for atrial fibrillation with the addition of aspirin for CVA prophylaxis. Patient developed coffee-ground emesis with acute blood loss anemia requiring transfusions totaling 6 units units pack red blood cells and gastroenterology consulted EGD completed 2 with findings of red blood in the lower third of the esophagus clotted blood in the entire stomach. Blood thinners were placed on hold patient remained NPO with follow-up upper GI endoscopy again completed 01/26/2019 Dr Cristina Gong showing no active bleeding or blood present and appeared to show some resolving ischemic changes in the region of the gastric pouch and the anastomosis. His diet was slowly advanced. Patient's aspirin and Eliquis currently remains on hold and await plan to resume and possibly 3-5 days. It was recommended to continue Protonix twice daily. Hospital course AKI with creatinine baseline 1.1-1.23 elevated to 1.95 with renal services consulted 01/23/2019. AKI likely secondary to ischemic ATN in the setting of sepsis. Maintain on gentle IV fluids and creatinine improved 1.00. Patient is completing a course of Ancef for sepsis. Therapy evaluations have been completed with recommendations of physical medicine rehabilitation consult. Patient was admitted for a comprehensive rehabilitation program.  Patient transferred to CIR on 01/30/2019 .   Patient currently requires total with mobility secondary to muscle weakness, decreased cardiorespiratoy endurance, decreased coordination, decreased awareness, decreased problem solving, decreased safety awareness, decreased memory  and delayed processing, and  decreased sitting balance, decreased standing balance, decreased postural control and decreased balance strategies.  Prior to hospitalization, patient was independent  with mobility and lived with Spouse in a House home.  Home access is 3Stairs to enter.  Patient will benefit from skilled PT intervention to maximize safe functional mobility, minimize fall risk and decrease caregiver burden for planned discharge home with 24 hour assist.  Anticipate patient will benefit from follow up [facility] at discharge.  PT - End of Session Activity Tolerance: Tolerates 30+ min activity with multiple rests Endurance Deficit: Yes Endurance Deficit Description: generalized deconditioning PT Assessment Rehab Potential (ACUTE/IP ONLY): Fair PT Barriers to Discharge: Inaccessible home environment;Decreased caregiver support;Home environment access/layout PT Barriers to Discharge Comments: steps to enter house, unsure if pt's wife can provide physical 24 hr assist PT Patient demonstrates impairments in the following area(s): Balance;Skin Integrity;Pain;Behavior;Edema;Endurance;Safety;Motor;Sensory PT Transfers Functional Problem(s): Bed Mobility;Bed to Chair;Car;Furniture PT Locomotion Functional Problem(s): Ambulation;Stairs;Wheelchair Mobility PT Plan PT Intensity: Minimum of 1-2 x/day ,45 to 90 minutes PT Frequency: 5 out of 7 days PT Duration Estimated Length of Stay: 3-4 weeks PT Treatment/Interventions: Ambulation/gait training;Cognitive remediation/compensation;Discharge planning;DME/adaptive equipment instruction;Functional mobility training;Pain management;Psychosocial support;Splinting/orthotics;Therapeutic Activities;UE/LE Strength taining/ROM;Wheelchair propulsion/positioning;UE/LE Coordination activities;Therapeutic Exercise;Stair training;Skin care/wound management;Patient/family education;Neuromuscular re-education;Functional electrical stimulation;Disease management/prevention;Balance/vestibular  training;Community reintegration PT Transfers Anticipated Outcome(s): min assist with LRAD PT Locomotion Anticipated Outcome(s): supervision w/c level PT Recommendation Recommendations for Other Services: Speech consult;Therapeutic Recreation consult Therapeutic Recreation Interventions: Stress management Follow Up Recommendations: Home health PT;24 hour supervision/assistance Patient destination: Home Equipment Recommended: To be determined  Skilled Therapeutic Intervention Patient received in bed with wife Hortense Ramal) present for session. Therapist spent significant time reviewing stroke handbook, educating pt on stroke, stroke recovery, risk factors, etc with pt & wife verbalizing understanding of information. Educated pt & wife on ELOS, daily therapy schedule, and weekly team meetings. Provided pt with 24x18 w/c to encourage OOB activity. Pt reports pain in RLE with palpation & movement, with repositioning & rest breaks provided & RN made aware & administering meds during session. Pt prefers to provide instruction to therapist in regards to how he moves for sit<>supine and is able to transfer BLE to EOB slowly, with multiple rest breaks. Pt ultimately requires max assist for supine to sit with hospital bed features, +2 assist for sit>supine and rolling L<>R with more c/o pain in RLE when assisted with rolling to L. Once sitting EOB pt requires min assist for static sitting balance and reports need to use restroom. Upon therapist education & assistance pt transitioned back to supine with therapist providing dependent assist for clothing management, instructional cuing for rolling L<>R to allow bed pan to be placed. Pt's wife assists with repositioning pt in bed & using urinal. Pt with smear of black stool & PA (Pam) made aware. At end of session pt reports fatigue & was left in bed with alarm set & needs at hand, wife present in room.  PT  Evaluation Precautions/Restrictions Precautions Precautions: Fall Precaution Comments: R sided weakness and R hip/thigh pain Restrictions Weight Bearing Restrictions: No  General Chart Reviewed: Yes Additional Pertinent History: CHF, aortic stenosis post TAVR 9/19, a-fib, OSA with CPAP, HTN, B knee arthritis, hx of SDH, HLD, morbid obesity  Home Living/Prior Functioning Home Living Living Arrangements: Spouse/significant other Available Help at Discharge: Family;Available 24 hours/day Type of Home: House Home Access: Stairs to enter [employer] of Steps: 3 Entrance Stairs-Rails: Right Home Layout: One level Bathroom Shower/Tub: Tub/shower unit;Walk-in shower Bathroom Toilet:  Handicapped height Bathroom Accessibility: Yes  Lives With: Spouse Prior Function Level of Independence: Independent with basic ADLs;Independent with homemaking with ambulation;Independent with gait  Able to Take Stairs?: Yes Driving: Yes Vocation: Retired Comments: Hydrographic surveyor, drives, was actively going to [employer] and using the pool there   Vision/Perception  Pt wears glasses at all times. No changes from baseline vision. No apparent visual deficits.  Cognition Overall Cognitive Status: Impaired/Different from baseline Arousal/Alertness: Awake/alert Orientation Level: Oriented X4 Memory: Impaired Memory Impairment: Decreased recall of new information;Decreased long term memory;Decreased short term memory Awareness: Impaired Awareness Impairment: Anticipatory impairment Problem Solving: Impaired Safety/Judgment: Impaired   Sensation Sensation Light Touch: Impaired by gross assessment(LLE) Proprioception: Appears Intact(BLE) Coordination Gross Motor Movements are Fluid and Coordinated: No Fine Motor Movements are Fluid and Coordinated: No Coordination and Movement Description: BUE & BLE weakness   Motor  Motor Motor: Abnormal postural alignment and control Motor -  Skilled Clinical Observations: B hemiparesis (R>L), generalized deconditioning   Mobility Bed Mobility Bed Mobility: Rolling Right;Rolling Left;Sit to Supine;Supine to Sit Rolling Right: 2 Helpers Rolling Left: 2 Helpers Supine to Sit: 2 Helpers Sit to Supine: Maximal Assistance - Patient 25-49%  Locomotion  Gait Ambulation: No Gait Gait: No Stairs / Additional Locomotion Stairs: No Wheelchair Mobility Wheelchair Mobility: No   Trunk/Postural Assessment  Lumbar Assessment Lumbar Assessment: Exceptions to WFL(posterior pelvic tilt) Postural Control Postural Control: Deficits on evaluation Righting Reactions: delayed Protective Responses: delayed   Balance Balance Balance Assessed: Yes Static Sitting Balance Static Sitting - Balance Support: Feet supported;Left upper extremity supported Static Sitting - Level of Assistance: 4: Min assist  Extremity Assessment  RUE Assessment RUE Assessment: Exceptions to [facility] Passive Range of Motion (PROM) Comments: WFL Active Range of Motion (AROM) Comments: limited shoulder ROM, elbow flexion/extension WFL General Strength Comments: 3/5 RUE Body System: Neuro Brunstrum levels for arm and hand: Arm;Hand Brunstrum level for arm: Stage II Synergy is developing Brunstrum level for hand: Stage III Synergies performed voluntarily LUE Assessment LUE Assessment: Exceptions to [facility] Passive Range of Motion (PROM) Comments: WFL Active Range of Motion (AROM) Comments: limited shoulder ROM, elbow flexion/extension WFL General Strength Comments: 3+ to 4/5  RLE Assessment RLE Assessment: Exceptions to [facility] Active Range of Motion (AROM) Comments: limited by pain General Strength Comments: able to move hip flexors & dorsiflexors with gravity eliminated while supine in bed LLE Assessment LLE Assessment: Exceptions to [facility] General Strength Comments: able to move hip flexors & dorsiflexors with gravity eliminated while supine in bed    Refer to  Care Plan for Long Term Goals  Recommendations for other services: Therapeutic Recreation  Stress management  Discharge Criteria: Patient will be discharged from PT if patient refuses treatment 3 consecutive times without medical reason, if treatment goals not met, if there is a change in medical status, if patient makes no progress towards goals or if patient is discharged from hospital.  The above assessment, treatment plan, treatment alternatives and goals were discussed and mutually agreed upon: by patient and by family  [place] 01/31/2019, 4:05 PM

## 2019-01-31 NOTE — Progress Notes (Signed)
[name], RN  Rehab Admission Coordinator  Physical Medicine and Rehabilitation  PMR Pre-admission  Signed  Date of Service:  01/30/2019 9:52 AM       Related encounter: ED to Hosp-Admission (Discharged) from 01/13/2019 in [place] [facility]      Signed         Show:Clear all '[x]'$ Manual'[x]'$ Template'[x]'$ Copied  Added by: '[x]'$ [name], RN  '[]'$ Hover for details PMR Admission Coordinator Pre-Admission Assessment  Patient: Joel Rogers is an 68 y.o., male MRN: [id number] DOB: 01-11-1951 Height: '6\' 1"'$  (185.4 cm) Weight: 126.9 kg                                                                                                                                                  Insurance Information HMO: Yes    PPO:       PCP:       IPA:       80/20:       OTHER:   PRIMARY: UHC Medicare      Policy#: [id number]      Subscriber: patient CM Name:  Blanch Media     Phone#: [phone]     Fax#: [phone] Pre-Cert#: [id number] for 7 days and follow up to Celene Skeen at fax [phone]      Employer: Retired Benefits:  Phone #: [phone]     Name: On line portal Eff. Date: 11/22/18     Deduct: $0      Out of Pocket Max: $3600 (met $30)      Life Max: N/A CIR: $295 days 1-5      SNF: $0 days 1-20; $160 days 21-43; $0 days 44-100 Outpatient: medical necessity     Co-Pay: $30/visit Home Health: 100%      Co-Pay: none DME: 80%     Co-Pay: 20% Providers: in network  Medicaid Application Date:       Case Manager: Disability Application Date:       Case Worker:   Emergency Publishing copy Information    Name Relation Home Work Mobile   [place] Spouse [phone]  [phone]     Current Medical History  Patient Admitting Diagnosis: sepsis, cardio-embolic bi-cerebral infarcts    History of Present Illness: A 68 year old right-handed male with history of diastolic congestive heart failure, aortic stenosis status post TAVR  September 2019, atrial fibrillation maintained on Eliquis,OSA-CPAP, hypertension. Per chart review lives with spouse. Community ambulator active and driving. One level home with 3 steps to entry.Presented [facility] 01/13/2019 secondary to right and left upper extremity pain with recent fall and right side weakness and findings of sepsis due to Staphylococcus bacteremia and cellulitis.during admission maintain on antibiotic therapy. Noted persistent right side weakness. MRI of the brain revealed multiple acute ischemic infarctions in a pattern  most consistent with cardioembolic stroke. Patient was transferred to [facility] for further evaluation. Venous Doppler studies showed no signs of DVT. Findings of elevated liver function studies ultrasound the abdomen showed no focal lesion identified. Follow-up MRI of the brain with numerous scattered foci of acute ischemia throughout the cerebrum and cerebellum. Many of these lesions suggestive of embolic pattern. No midline shift or mass effect. No proximal intracranial arterial occlusion. Echocardiogram with ejection fraction of 02% normal systolic function. TEE completed showing no vegetation on bioprosthetic aortic valve. Patient remained on eliquis for atrial fibrillation with the addition of aspirin for CVA prophylaxis. Patient developed coffee-ground emesis with acute blood loss anemia requiring transfusions totaling 6 units units pack red blood cells and gastroenterology consulted EGD completed 2 with findings of red blood in the lower third of the esophagus clotted blood in the entire stomach. Blood thinners were placed on hold patient remained NPO with follow-up upper GI endoscopy again completed 01/26/2019 Dr Cristina Gong showing no active bleeding or blood present and appeared to show some resolving ischemic changes in the region of the gastric pouch and the anastomosis. His diet was slowly advanced. Patient's aspirin and Eliquis currently remains  on hold and await plan to resume and possibly 3-5 days. It was recommended to continue Protonix twice daily. Hospital course AKI with creatinine baseline 1.1-1.23 elevated to 1.95 with renal services consulted 01/23/2019. AKI likely secondary to ischemic ATN in the setting of sepsis. Maintain on gentle IV fluids and creatinine improved 1.00. Patient is completing a course of Ancef for sepsis. Therapy evaluations have been completed with recommendations of physical medicine rehabilitation consult. Patient to be admitted for a comprehensive inpatient rehabilitation program.   Complete NIHSS TOTAL: 6  Past Medical History      Past Medical History:  Diagnosis Date  . Anemia    low iron  . Arthritis    bilateral knees  . Atrial fibrillation, chronic   . Chronic diastolic CHF (congestive heart failure) ([place]) 06/15/2018  . History of subdural hematoma   . Hyperlipidemia   . Hypertension   . Morbid obesity ([place])   . Normal coronary arteries    by cardiac catheterization performed 03/14/06  . Pre-diabetes    pt denies  . S/P TAVR (transcatheter aortic valve replacement)    a. 07/25/18: Edwards Sapien 3 THV (size 26 mm, model # 9600TFX, serial # [id number]) by Dr. Cyndia Bent and Dr. Angelena Form  . Sleep apnea    on CPAP  . Venous insufficiency     Family History  family history includes Alzheimer's disease in his maternal grandfather and mother; Breast cancer in his sister; Cancer in his brother and father; Hypertension in his father.  Prior Rehab/Hospitalizations: Had heart valve surgery in 07/2018.  No previous rehab.  Has the patient had major surgery during 100 days prior to admission? No  Current Medications   Current Facility-Administered Medications:  .  0.9 %  sodium chloride infusion, , Intravenous, PRN, Buccini, Robert, MD, Last Rate: 10 mL/hr at 01/27/19 0517 .  acetaminophen (TYLENOL) tablet 650 mg, 650 mg, Oral, Q6H PRN, Buccini, Robert, MD .  atorvastatin  (LIPITOR) tablet 80 mg, 80 mg, Oral, q1800, Buccini, Robert, MD, 80 mg at 01/29/19 1751 .  ceFAZolin (ANCEF) IVPB 2g/100 mL premix, 2 g, Intravenous, Q8H, Karren Cobble, RPH, Last Rate: 200 mL/hr at 01/30/19 0553, 2 g at 01/30/19 0553 .  diltiazem (CARDIZEM CD) 24 hr capsule 120 mg, 120 mg, Oral, Daily, Eugenie Filler,  MD .  metoprolol tartrate (LOPRESSOR) injection 5 mg, 5 mg, Intravenous, Q2H PRN, Buccini, Robert, MD .  ondansetron (ZOFRAN) tablet 4 mg, 4 mg, Oral, Q6H PRN **OR** ondansetron (ZOFRAN) injection 4 mg, 4 mg, Intravenous, Q6H PRN, Buccini, Robert, MD, 4 mg at 01/24/19 0446 .  oxyCODONE (Oxy IR/ROXICODONE) immediate release tablet 5-10 mg, 5-10 mg, Oral, Q4H PRN, Buccini, Robert, MD .  pantoprazole (PROTONIX) EC tablet 40 mg, 40 mg, Oral, BID AC, Eugenie Filler, MD, 40 mg at 01/30/19 0926 .  polyethylene glycol (MIRALAX / GLYCOLAX) packet 17 g, 17 g, Oral, Daily PRN, Buccini, Robert, MD .  sodium chloride flush (NS) 0.9 % injection 10-40 mL, 10-40 mL, Intracatheter, Q12H, Buccini, Robert, MD, 10 mL at 01/30/19 1013 .  sodium chloride flush (NS) 0.9 % injection 10-40 mL, 10-40 mL, Intracatheter, PRN, Buccini, Robert, MD .  spironolactone (ALDACTONE) tablet 12.5 mg, 12.5 mg, Oral, Daily, Eugenie Filler, MD, 12.5 mg at 01/30/19 0926 .  sucralfate (CARAFATE) 1 GM/10ML suspension 1 g, 1 g, Oral, Q6H, Eugenie Filler, MD, 1 g at 01/30/19 0551  Patients Current Diet:     Diet Order                  DIET SOFT Room service appropriate? Yes; Fluid consistency: Thin  Diet effective now               Precautions / Restrictions Precautions Precautions: Fall Precaution Comments: R sided weakness and R hip/thigh pain Restrictions Weight Bearing Restrictions: No   Has the patient had 2 or more falls or a fall with injury in the past year?No.  He suffered one fall PTA, no injury.  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 Walker and SPC  Prior Functional Level Prior Function Level of Independence: Independent Comments: community ambulator, drives, was actively going to [employer] and using the pool there  Self Care: Did the patient need help bathing, dressing, using the toilet or eating?  Independent  Indoor Mobility: Did the patient need assistance with walking from room to room (with or without device)? Independent  Stairs: Did the patient need assistance with internal or external stairs (with or without device)? Independent  Functional Cognition: Did the patient need help planning regular tasks such as shopping or remembering to take medications? Independent  Current Functional Level Cognition  Arousal/Alertness: Awake/alert Overall Cognitive Status: (not specifically tested, has to be reminded of L lean) Orientation Level: Oriented X4 Following Commands: Follows one step commands with increased time General Comments: pt is very sweet, pleasant and motivated to work with therapy Attention: Selective Selective Attention: Impaired Selective Attention Impairment: Verbal basic Memory: Impaired Memory Impairment: Storage deficit, Retrieval deficit, Decreased recall of new information Awareness: Impaired Awareness Impairment: Anticipatory impairment    Extremity Assessment (includes Sensation/Coordination)  Upper Extremity Assessment: RUE deficits/detail, Generalized weakness RUE Deficits / Details: grossly 2+/5, increased swelling noted in RUE, decreased sensation RUE Sensation: decreased light touch RUE Coordination: decreased fine motor,  decreased gross motor LUE Deficits / Details: grossly -4/5  Lower Extremity Assessment: Defer to PT evaluation    ADLs  Overall ADL's : Needs assistance/impaired Eating/Feeding: Set  up, Bed level Eating/Feeding Details (indicate cue type and reason): pt reports having increased appetite today, reports no nausea Grooming: Min guard, Set up, Bed level, Wash/dry face, Sitting Grooming Details (indicate cue type and reason): pt with assist to apply lotion to back Upper Body Bathing: Minimal assistance, Sitting Lower Body Bathing: Maximal assistance, +2 for physical assistance, +2 for safety/equipment, Sitting/lateral leans Upper Body Dressing : Moderate assistance, Sitting Lower Body Dressing: +2 for physical assistance, +2 for safety/equipment, Sitting/lateral leans, Maximal assistance, Total assistance Lower Body Dressing Details (indicate cue type and reason): assist to don socks at bed level Toileting- Clothing Manipulation and Hygiene: Total assistance, +2 for physical assistance, +2 for safety/equipment, Bed level Functional mobility during ADLs: Maximal assistance, Total assistance, +2 for physical assistance, +2 for safety/equipment General ADL Comments: pt reports increased fatigue today having had EGD earlier and transferring to new floors, agreeable to bed level therapy session    Mobility  Overal bed mobility: Needs Assistance Bed Mobility: Rolling Rolling: +2 for physical assistance, Max assist Supine to sit: +2 for physical assistance, HOB elevated, Max assist Sit to supine: +2 for physical assistance, Max assist General bed mobility comments: Two person max assist to roll bil, he was able to help more rolling toward his right side as he can better pull with his left hand.  His wife provided the second person assist and reports she took care of her mother for years.  She seemed very comfortable in managing him physically.     Transfers  Overall transfer level: Needs  assistance Equipment used: Rolling walker (2 wheeled) Transfer via Lift Equipment: [employer] Transfers: Sit to/from Stand, [employer] Transfers Sit to Stand: Mod assist, Max assist Stand pivot transfers: Mod assist, Max assist General transfer comment: Assisted in returning pt to bed after being up for >1 hour.     Ambulation / Gait / Stairs / Wheelchair Mobility  Ambulation/Gait Ambulation/Gait assistance: Max Web designer (Feet): 4 Feet Assistive device: Rolling walker (2 wheeled) Gait Pattern/deviations: Decreased step length - right, Decreased step length - left, Decreased stride length General Gait Details: limited to 7-8 slow labored side steps due to BLE weakness Gait velocity: slow    Posture / Balance Dynamic Sitting Balance Sitting balance - Comments: Pt fatigues quickly and needs external assist to maintain midline posture.  He has a left lateral preference.  He was able to stay up longer and support himself in sitting more today as we were EOB >15 mins.   Balance Overall balance assessment: Needs assistance Sitting-balance support: Feet supported, Bilateral upper extremity supported, Single extremity supported Sitting balance-Leahy Scale: Poor Sitting balance - Comments: Pt fatigues quickly and needs external assist to maintain midline posture.  He has a left lateral preference.  He was able to stay up longer and support himself in sitting more today as we were EOB >15 mins.   Postural control: Left lateral lean Standing balance support: Bilateral upper extremity supported, During functional activity Standing balance-Leahy Scale: Poor Standing balance comment: using RW    Special needs/care consideration BiPAP/CPAP No CPM No Continuous Drip IV No.  Does have IV antibiotic. Dialysis No    Life Vest No Oxygen No Special Bed No Trach Size No Wound Vac (area) No      Skin No                             Bowel mgmt: Last BM 01/26/19 Bladder mgmt:  Incontinent,  external catheter in place Diabetic mgmt No    Previous Home Environment Living Arrangements: Spouse/significant other  Lives With: Spouse Available Help at Discharge: Family, Available 24 hours/day Type of Home: House Home Layout: One level Home Access: Stairs to enter Entrance Stairs-Rails: Right Entrance Stairs-Number of Steps: 3 Bathroom Shower/Tub: Public librarian, Multimedia programmer: Handicapped height Bathroom Accessibility: Yes How Accessible: Accessible via wheelchair Whitakers: No  Discharge Living Setting Plans for Discharge Living Setting: Patient's home Type of Home at Discharge: House Discharge Home Layout: One level Discharge Home Access: Stairs to enter Entrance Stairs-Rails: Left Entrance Stairs-Number of Steps: 2-3 Discharge Bathroom Shower/Tub: Tub/shower unit, Walk-in shower Discharge Bathroom Toilet: Handicapped height Discharge Bathroom Accessibility: Yes How Accessible: Accessible via wheelchair Does the patient have any problems obtaining your medications?: No  Social/Family/Support Systems Patient Roles: Spouse, Parent, 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) Contact Information: Wife - Joel Rogers Anticipated Caregiver: wife, Joel Rogers Anticipated Ambulance person Information: [phone] (cell), [phone] (home) Ability/Limitations of Caregiver: Wife stays at home, is not working, she can assist Caregiver Availability: 24/7 Discharge Plan Discussed with Primary Caregiver: Yes Is Caregiver In Agreement with Plan?: Yes Does Caregiver/Family have Issues with Lodging/Transportation while Pt is in Rehab?: No  Goals/Additional Needs Patient/Family Goal for Rehab: PT/OT supervision to min assist, SLP mod I and supervision goals Expected length of stay: 12-16 days 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, museum/gallery Dietary Needs: soft diet Equipment Needs: tbd Pt/Family Agrees to Admission and willing to participate:  Yes Program Orientation Provided & Reviewed with Pt/Caregiver Including Roles  & Responsibilities: Yes  Decrease burden of Care through IP rehab admission: N/A  Possible need for SNF placement upon discharge: Not planned  Patient Condition: This patient's medical and functional status has changed since the consult dated: 01/25/19 in which the Rehabilitation Physician determined and documented that the patient's condition is appropriate for intensive rehabilitative care in an inpatient rehabilitation facility. See "History of Present Illness" (above) for medical update. Functional changes are: Currently requiring Max assist +2 for mobility and ADLs. Patient's medical and functional status update has been discussed with the Rehabilitation physician and patient remains appropriate for inpatient rehabilitation. Will admit to inpatient rehab today.  Preadmission Screen Completed By:  [name], 01/30/2019 11:50 AM ______________________________________________________________________   Discussed status with Dr. Naaman Plummer on 01/30/19 at 1149 and received telephone approval for admission today.  Admission Coordinator:  [name], time 1149/Date 01/30/19           Cosigned by: Meredith Staggers, MD at 01/30/2019 1:04 PM  Revision History

## 2019-01-31 NOTE — Patient Care Conference (Signed)
Inpatient RehabilitationTeam Conference and Plan of Care Update Date: 01/31/2019   Time: 10:50 AM    Patient Name: Joel Rogers      Medical Record Number: [id number]  Date of Birth: 1951/03/22 Sex: Male         Room/Bed: 4W17C/4W17C-01 Payor Info: Payor: Advertising copywriter MEDICARE / Plan: UHC MEDICARE / Product Type: *No Product type* /    Admitting Diagnosis: Bi cerebral infarcts  Admit Date/Time:  01/30/2019  4:11 PM Admission Comments: No comment available   Primary Diagnosis:  <principal problem not specified> Principal Problem: <principal problem not specified>  Patient Active Problem List   Diagnosis Date Noted  . Acute bilateral cerebral infarction in a watershed distribution [facility]) 01/30/2019  . UGIB (upper gastrointestinal bleed) 01/26/2019  . Bacteremia due to Streptococcus 01/26/2019  . Cellulitis 01/26/2019  . Upper GI bleed   . Acute CVA (cerebrovascular accident) (H[facility]) 01/19/2019  . Acute renal failure ([facility]) 01/14/2019  . Right shoulder pain 01/14/2019  . Sepsis ([facility]) 01/13/2019  . S/P TAVR (transcatheter aortic valve replacement) 07/25/2018  . History of subdural hematoma   . Atrial fibrillation, chronic   . Acute on chronic diastolic heart failure (HCC) 06/15/2018  . Pulmonary hypertension, unspecified ([facility]) 02/15/2018  . Varicose veins of right lower extremity with complications 01/10/2018  . History of Severe aortic stenosis 08/09/2017  . Vitamin D deficiency 12/02/2016  . Status post gastric bypass for obesity 11/02/2016  . HTN (hypertension) 04/13/2013  . Hyperlipemia 04/13/2013  . Chronic atrial fibrillation 02/14/2013  . Morbid obesity ([facility]) 06/18/2008  . Obstructive sleep apnea 06/18/2008    Expected Discharge Date:    Team Members Present: Physician leading conference: Dr. Claudette Laws Social Worker Present: Dossie Der, LCSW Nurse Present: Ronny Bacon, RN PT Present: Aleda Grana, PT OT Present: Rosalio Loud, OT SLP Present:  Colin Benton, SLP PPS Coordinator present : Fae Pippin     Current Status/Progress Goal Weekly Team Focus  Medical   Gout improved on prednisone, blood pressure still labile.  Lower extremity edema, off Eliquis, need to resume per neuro to avoid recurrent stroke but need to wait because of GI bleed  Maintain medical stability, monitor neuro status while resuming the Eliquis  Initiate rehab program, address anticoagulation issues.  Address lower extremity edema   Bowel/Bladder   continent of bowel, LBM 01/31/19; condom catheter   maintain regular bowel pattern; remove condom cath and begin tolieting   assist with tolieting needs as needed   Swallow/Nutrition/ Hydration             ADL's     eval pending        Mobility     eval pending        Communication             Safety/Cognition/ Behavioral Observations            Pain   no c/o pain; tylenol prn   remain pain free   assess pain q shift and prn    Skin   unblanchable dark spot on left sacrum; generalized swelling +2, RUE significantly swollen   remain free of new skin breakdown/infection; improve on curtain skin issues   assess q shift and prn       *See Care Plan and progress notes for long and short-term goals.     Barriers to Discharge  Current Status/Progress Possible Resolutions Date Resolved   Physician    Medical stability     Initiating program  Continue rehab, may need additional GI input if hemoglobin starts to drop      Nursing                  PT                    OT                  SLP                SW                Discharge Planning/Teaching Needs:    Home with wife who can assist him. Pt wants to be mod/i before going home with wife.     Team Discussion:  New evaluations-setting goals. R-hip pain MD addressing. PICC in for another few days until Eliquis re-started on Monday. Fatigues quickly will need rest breaks.  Revisions to Treatment Plan:  New eval    Continued Need for  Acute Rehabilitation Level of Care: The patient requires daily medical management by a physician with specialized training in physical medicine and rehabilitation for the following conditions: Daily direction of a multidisciplinary physical rehabilitation program to ensure safe treatment while eliciting the highest outcome that is of practical value to the patient.: Yes Daily medical management of patient stability for increased activity during participation in an intensive rehabilitation regime.: Yes Daily analysis of laboratory values and/or radiology reports with any subsequent need for medication adjustment of medical intervention for : Neurological problems;Blood pressure problems;Other   I attest that I was present, lead the team conference, and concur with the assessment and plan of the team.   Lucy Chris 01/31/2019, 1:33 PM

## 2019-01-31 NOTE — Progress Notes (Addendum)
[facility] PHYSICAL MEDICINE & REHABILITATION PROGRESS NOTE   Subjective/Complaints:  No issues overnite, discussed weakness on R side, no gouty pain  ROS- denies CP, SOB, N/V/D, has good sensation for bladder, calls Nsg  Objective:   No results found. Recent Labs    01/30/19 0830 01/31/19 0612  WBC 16.7* 14.8*  HGB 9.3* 8.8*  HCT 29.1* 27.2*  PLT 121* 130*   Recent Labs    01/30/19 0830 01/31/19 0612  NA 135 137  K 3.4* 3.5  CL 108 108  CO2 24 26  GLUCOSE 130* 106*  BUN 10 11  CREATININE 1.01 0.93  CALCIUM 8.1* 8.2*    Intake/Output Summary (Last 24 hours) at 01/31/2019 0743 Last data filed at 01/31/2019 7829 Gross per 24 hour  Intake -  Output 2050 ml  Net -2050 ml     Physical Exam: Vital Signs Blood pressure 137/75, pulse 80, temperature 98.2 F (36.8 C), temperature source Axillary, resp. rate 18, height 6\' 1"  (1.854 m), weight 126.7 kg, SpO2 98 %.   General: No acute distress Mood and affect are appropriate Heart: IRRR  no rubs murmurs or extra sounds Lungs: Clear to auscultation, breathing unlabored, no rales or wheezes Abdomen: Positive bowel sounds, soft nontender to palpation, nondistended Extremities: No clubbing, cyanosis, or edema Skin: No evidence of breakdown, no evidence of rash Neurologic: Cranial nerves II through XII intact, motor strength is 5/5 in left deltoid, bicep, tricep, grip,2- bilateral hip flexor, knee extensors, ankle dorsiflexor and plantar flexor 2- R delt, 4/5 R Biceps, tricep, grip Cerebellar exam normal finger to nose to finger LUE, cannot assess in LE and RUE d/t weakness  Musculoskeletal: Full range of motion in all 4 extremities. No joint swelling   Assessment/Plan: 1. Functional deficits secondary to Right hemiparesis, and LLE monoparesis which require 3+ hours per day of interdisciplinary therapy in a comprehensive inpatient rehab setting.  Physiatrist is providing close team supervision and 24 hour management of  active medical problems listed below.  Physiatrist and rehab team continue to assess barriers to discharge/monitor patient progress toward functional and medical goals  Care Tool:  Bathing              Bathing assist       Upper Body Dressing/Undressing Upper body dressing   What is the patient wearing?: Hospital gown only    Upper body assist      Lower Body Dressing/Undressing Lower body dressing      What is the patient wearing?: Hospital gown only     Lower body assist       Toileting Toileting    Toileting assist Assist for toileting: Maximal Assistance - Patient 25 - 49%     Transfers Chair/bed transfer  Transfers assist           Locomotion Ambulation   Ambulation assist              Walk 10 feet activity   Assist           Walk 50 feet activity   Assist           Walk 150 feet activity   Assist           Walk 10 feet on uneven surface  activity   Assist           Wheelchair     Assist               Wheelchair 50 feet with 2 turns  activity    Assist            Wheelchair 150 feet activity     Assist          Medical Problem List and Plan: 1.Persistent right side weakness with decreased functional mobilitysecondary tocardioembolicbi-cerebral infarctions CIR PT, OT,evals 2. Antithrombotics: -DVT/anticoagulation:SCDs.Dopplers negative for DVT -eliquis on hold due to GIB -antiplatelet therapy: ASA on hold d/t GIB Keep PICC until f/u CBC after Eliquis resumed, may need transfusion 3. Pain Management:Tylenol as needed 4. Mood:Provide emotional support -antipsychotic agents: None 5. Neuropsych: This patientiscapable of making decisions on hisown behalf. 6. Skin/Wound Care:Routine skin checks 7. Fluids/Electrolytes/Nutrition:Routine in and out's with follow-up chemistries 8. Right  lower extremity cellulitis with sepsis/Streptococcus bacteremia. Intravenous Ancef completed 01/30/2019 9. Upper GI bleed. EGD 3. Follow-up gastroenterology services. Continue PPI.Resume anticoag on Monday per Neuro10. Acute blood loss anemia. Patient has been transfused to do 6 units pack red blood cells. Follow-up CBC. 11. Chronic atrial fibrillation as well as history of TAVR procedure.Diltiazem 120 mg daily.Cardiac rate controlled. 3/11 Vitals:   01/30/19 2037 01/31/19 0421  BP: (!) 139/95 137/75  Pulse: 69 80  Resp: 18 18  Temp: 99 F (37.2 C) 98.2 F (36.8 C)  SpO2: 100% 98%   12. Hypertension.Aldactone 12.5 mg daily. Monitor with increased mobility Controlled 3/11 13. Hyperlipidemia. Lipitor 14. OSA. CPAP 15. Gout left great toe: prednisone 10mg  bid x 4 doses    LOS: 1 days A FACE TO FACE EVALUATION WAS PERFORMED  Erick Colace 01/31/2019, 7:43 AM

## 2019-01-31 NOTE — Evaluation (Signed)
Occupational Therapy Assessment and Plan  Patient Details  Name: Joel Rogers MRN: [id number] Date of Birth: 15-Jun-1951  OT Diagnosis: acute pain, apraxia, cognitive deficits, hemiplegia affecting dominant side, hemiplegia affecting non-dominant side, muscle weakness (generalized) and pain in joint Rehab Potential: Rehab Potential (ACUTE ONLY): Fair ELOS: 21-25 days   Today's Date: 01/31/2019 OT Individual Time: 0900-1000 OT Individual Time Calculation (min): 60 min     Problem List:  Patient Active Problem List   Diagnosis Date Noted  . Acute bilateral cerebral infarction in a [facility]) 01/30/2019  . UGIB (upper gastrointestinal bleed) 01/26/2019  . Bacteremia due to Streptococcus 01/26/2019  . Cellulitis 01/26/2019  . Upper GI bleed   . Acute CVA (cerebrovascular accident) ([place]) 01/19/2019  . Acute renal failure ([place]) 01/14/2019  . Right shoulder pain 01/14/2019  . Sepsis ([place]) 01/13/2019  . S/P TAVR (transcatheter aortic valve replacement) 07/25/2018  . History of subdural hematoma   . Atrial fibrillation, chronic   . Acute on chronic diastolic heart failure ([place]) 06/15/2018  . Pulmonary hypertension, unspecified ([place]) 02/15/2018  . Varicose veins of right lower extremity with complications 57/32/2025  . History of Severe aortic stenosis 08/09/2017  . Vitamin D deficiency 12/02/2016  . Status post gastric bypass for obesity 11/02/2016  . HTN (hypertension) 04/13/2013  . Hyperlipemia 04/13/2013  . Chronic atrial fibrillation 02/14/2013  . Morbid obesity ([place]) 06/18/2008  . Obstructive sleep apnea 06/18/2008    Past Medical History:  Past Medical History:  Diagnosis Date  . Anemia    low iron  . Arthritis    bilateral knees  . Atrial fibrillation, chronic   . Chronic diastolic CHF (congestive heart failure) ([place]) 06/15/2018  . History of subdural hematoma   . Hyperlipidemia   . Hypertension   . Morbid obesity ([place])   . Normal coronary  arteries    by cardiac catheterization performed 03/14/06  . Pre-diabetes    pt denies  . S/P TAVR (transcatheter aortic valve replacement)    a. 07/25/18: Edwards Sapien 3 THV (size 26 mm, model # 9600TFX, serial # [id number]) by Dr. Cyndia Bent and Dr. Angelena Form  . Sleep apnea    on CPAP  . Venous insufficiency    Past Surgical History:  Past Surgical History:  Procedure Laterality Date  . BIOPSY  01/26/2019   Procedure: BIOPSY;  Surgeon: Ronald Lobo, MD;  Location: [facility] ENDOSCOPY;  Service: Endoscopy;;  . CRANIOTOMY Right 08/27/2016   Procedure: CRANIOTOMY HEMATOMA EVACUATION SUBDURAL;  Surgeon: Erline Levine, MD;  Location: [place];  Service: Neurosurgery;  Laterality: Right;  . ESOPHAGOGASTRODUODENOSCOPY N/A 01/21/2019   Procedure: ESOPHAGOGASTRODUODENOSCOPY (EGD);  Surgeon: Clarene Essex, MD;  Location: [place];  Service: Endoscopy;  Laterality: N/A;  . ESOPHAGOGASTRODUODENOSCOPY (EGD) WITH PROPOFOL N/A 01/22/2019   Procedure: ESOPHAGOGASTRODUODENOSCOPY (EGD) WITH PROPOFOL;  Surgeon: Ronald Lobo, MD;  Location: [place];  Service: Endoscopy;  Laterality: N/A;  . ESOPHAGOGASTRODUODENOSCOPY (EGD) WITH PROPOFOL N/A 01/26/2019   Procedure: ESOPHAGOGASTRODUODENOSCOPY (EGD) WITH PROPOFOL;  Surgeon: Ronald Lobo, MD;  Location: [place];  Service: Endoscopy;  Laterality: N/A;  . GASTRIC BYPASS  09/2008  . JOINT REPLACEMENT  08/31/2006   right hip  . MULTIPLE EXTRACTIONS WITH ALVEOLOPLASTY N/A 07/13/2018   Procedure: Extraction of tooth #'s 3,8,10, 23-26, 30and 32 with alveoloplasty and gross debridement of remaining teeth;  Surgeon: Lenn Cal, DDS;  Location: [place];  Service: Oral Surgery;  Laterality: N/A;  . RIGHT HEART CATH N/A 07/06/2018   Procedure: RIGHT HEART CATH;  Surgeon: Burnell Blanks, MD;  Location: [place] CV LAB;  Service: Cardiovascular;  Laterality: N/A;  . RIGHT/LEFT HEART CATH AND CORONARY ANGIOGRAPHY N/A 07/10/2018   Procedure: RIGHT/LEFT HEART CATH  AND CORONARY ANGIOGRAPHY;  Surgeon: Jolaine Artist, MD;  Location: [place] CV LAB;  Service: Cardiovascular;  Laterality: N/A;  . TEE WITHOUT CARDIOVERSION N/A 07/25/2018   Procedure: TRANSESOPHAGEAL ECHOCARDIOGRAM (TEE);  Surgeon: Burnell Blanks, MD;  Location: [place];  Service: Open Heart Surgery;  Laterality: N/A;  . TEE WITHOUT CARDIOVERSION N/A 01/18/2019   Procedure: TRANSESOPHAGEAL ECHOCARDIOGRAM (TEE) WITH PROPOFOL;  Surgeon: Satira Sark, MD;  Location: [facility];  Service: Cardiovascular;  Laterality: N/A;  . TOTAL HIP ARTHROPLASTY     right hip  . TRANSCATHETER AORTIC VALVE REPLACEMENT, TRANSFEMORAL  07/25/2018  . TRANSCATHETER AORTIC VALVE REPLACEMENT, TRANSFEMORAL N/A 07/25/2018   Procedure: TRANSCATHETER AORTIC VALVE REPLACEMENT, TRANSFEMORAL;  Surgeon: Burnell Blanks, MD;  Location: [place];  Service: Open Heart Surgery;  Laterality: N/A;    Assessment & Plan Clinical Impression: Patient is a 68 y.o. right-handed male with history of diastolic congestive heart failure, aortic stenosis status post TAVR September 2019, atrial fibrillation maintained on Eliquis,OSA-CPAP, hypertension. Per chart review lives with spouse. Community ambulator active and driving. One level home with 3 steps to entry.Presented [facility] 01/13/2019 secondary to right and left upper extremity pain with recent fall and right side weakness and findings of sepsis due to Staphylococcus bacteremia and cellulitis.during admission maintain on antibiotic therapy. Noted persistent right side weakness. MRI of the brain revealed multiple acute ischemic infarctions in a pattern most consistent with cardioembolic stroke. Patient was transferred to [facility] for further evaluation. Venous Doppler studies showed no signs of DVT. Findings of elevated liver function studies ultrasound the abdomen showed no focal lesion identified. Follow-up MRI of the brain with numerous scattered foci of  acute ischemia throughout the cerebrum and cerebellum. Many of these lesions suggestive of embolic pattern. No midline shift or mass effect. No proximal intracranial arterial occlusion. Echocardiogram with ejection fraction of 19% normal systolic function. TEE completed showing no vegetation on bioprosthetic aortic valve. Patient remained on eliquis for atrial fibrillation with the addition of aspirin for CVA prophylaxis. Patient developed coffee-ground emesis with acute blood loss anemia requiring transfusions totaling 6 units units pack red blood cells and gastroenterology consulted EGD completed 2 with findings of red blood in the lower third of the esophagus clotted blood in the entire stomach. Blood thinners were placed on hold patient remained NPO with follow-up upper GI endoscopy again completed 01/26/2019 Dr Cristina Gong showing no active bleeding or blood present and appeared to show some resolving ischemic changes in the region of the gastric pouch and the anastomosis. His diet was slowly advanced. Patient's aspirin and Eliquis currently remains on hold and await plan to resume and possibly 3-5 days. It was recommended to continue Protonix twice daily. Hospital course AKI with creatinine baseline 1.1-1.23 elevated to 1.95 with renal services consulted 01/23/2019. AKI likely secondary to ischemic ATN in the setting of sepsis. Maintain on gentle IV fluids and creatinine improved 1.00. Patient is completing a course of Ancef for sepsis. Therapy evaluations have been completed with recommendations of physical medicine rehabilitation consult. Patient was admitted for a comprehensive rehabilitation program.   Patient transferred to CIR on 01/30/2019 .    Patient currently requires total assist +2 with basic self-care skills secondary to muscle weakness, decreased cardiorespiratoy endurance, impaired timing and sequencing, motor apraxia,  decreased coordination and decreased motor planning, decreased awareness,  decreased problem solving, decreased safety awareness and decreased memory and decreased sitting balance, decreased standing balance, decreased postural control and decreased balance strategies.  Prior to hospitalization, patient could complete ADLs with independent .  Patient will benefit from skilled intervention to decrease level of assist with basic self-care skills prior to discharge home with care partner.  Anticipate patient will require 24 hour supervision and minimal physical assistance and follow up home health.  OT - End of Session Activity Tolerance: Tolerates 30+ min activity with multiple rests Endurance Deficit: Yes Endurance Deficit Description: generalized deconditioning OT Assessment Rehab Potential (ACUTE ONLY): Fair OT Barriers to Discharge: Home environment access/layout OT Patient demonstrates impairments in the following area(s): Balance;Cognition;Endurance;Motor;Pain;Perception;Safety;Skin Integrity OT Basic ADL's Functional Problem(s): Grooming;Bathing;Dressing;Toileting OT Transfers Functional Problem(s): Toilet;Tub/Shower OT Additional Impairment(s): Fuctional Use of Upper Extremity OT Plan OT Intensity: Minimum of 1-2 x/day, 45 to 90 minutes OT Frequency: 5 out of 7 days OT Duration/Estimated Length of Stay: 21-25 days OT Treatment/Interventions: Balance/vestibular training;Cognitive remediation/compensation;Discharge planning;Disease mangement/prevention;DME/adaptive equipment instruction;Functional electrical stimulation;Functional mobility training;Neuromuscular re-education;Pain management;Patient/family education;Psychosocial support;Self Care/advanced ADL retraining;Skin care/wound managment;Splinting/orthotics;Therapeutic Activities;Therapeutic Exercise;UE/LE Strength taining/ROM;UE/LE Coordination activities OT Basic Self-Care Anticipated Outcome(s): Min assist OT Toileting Anticipated Outcome(s): Min assist  OT Bathroom Transfers Anticipated Outcome(s): Min  assist OT Recommendation Recommendations for Other Services: Neuropsych consult Patient destination: Home Follow Up Recommendations: Home health OT;24 hour supervision/assistance Equipment Recommended: Tub/shower bench(wide drop arm BSC)   Skilled Therapeutic Intervention OT eval completed with discussion of rehab process, OT purpose, POC, ELOS, and goals.  ADL assessment completed with UB bathing and dressing at EOB and LB dressing at bed level with +2 assistance due to body habitus and pain in Rt hip with movement.  Pt required +2 for bed mobility and attempts at sit > stand.  Unable to achieve full upright standing posture, however able to clear buttocks from EOB with max assist +2.  Pt demonstrating apraxia in RUE > LUE, however able to incorporate BUE in to bathing and dressing tasks.  Pt required frequent rest breaks and expressed how tiring bathing was.  Pt returned to supine in bed with +2 assist and left semi-reclined with all needs in reach.  OT Evaluation Precautions/Restrictions  Precautions Precautions: Fall Precaution Comments: R sided weakness and R hip/thigh pain Restrictions Weight Bearing Restrictions: No Pain Pain Assessment Pain Scale: 0-10 Pain Score: 0-No pain Home Living/Prior Functioning Home Living Family/patient expects to be discharged to:: Private residence Living Arrangements: Spouse/significant other Available Help at Discharge: Family, Available 24 hours/day Type of Home: House Home Access: Stairs to enter Technical brewer of Steps: 3 Entrance Stairs-Rails: Right Home Layout: One level Bathroom Shower/Tub: Tub/shower unit, Multimedia programmer: Handicapped height Bathroom Accessibility: Yes  Lives With: Spouse IADL History Homemaking Responsibilities: Yes Meal Prep Responsibility: Primary Prior Function Level of Independence: Independent with basic ADLs, Independent with homemaking with ambulation, Independent with gait  Able to  Take Stairs?: Yes Driving: Yes Vocation: Retired Comments: Hydrographic surveyor, drives, was actively going to [employer] and using the pool there Vision Baseline Vision/History: Wears glasses Wears Glasses: At all times Patient Visual Report: No change from baseline Vision Assessment?: No apparent visual deficits Cognition Overall Cognitive Status: Impaired/Different from baseline Arousal/Alertness: Awake/alert Orientation Level: Person;Place;Situation Person: Oriented Place: Oriented Situation: Oriented Year: 2020 Month: March Day of Week: Correct Memory: Impaired Memory Impairment: Storage deficit;Retrieval deficit;Decreased recall of new information Immediate Memory Recall: Sock;Blue;Bed Memory Recall: Blue;Bed Memory Recall Blue: Without  Cue Memory Recall Bed: Without Cue Attention: Selective Selective Attention: Impaired Awareness: Impaired Problem Solving: Appears intact Safety/Judgment: Impaired Sensation Sensation Light Touch: Impaired by gross assessment Proprioception: Impaired by gross assessment Coordination Gross Motor Movements are Fluid and Coordinated: No Fine Motor Movements are Fluid and Coordinated: No Coordination and Movement Description: BUE weakness, decreased coordination in Rt > Lt Extremity/Trunk Assessment RUE Assessment RUE Assessment: Exceptions to [facility] Passive Range of Motion (PROM) Comments: WFL Active Range of Motion (AROM) Comments: limited shoulder ROM, elbow flexion/extension WFL General Strength Comments: 3/5 RUE Body System: Neuro Brunstrum levels for arm and hand: Arm;Hand Brunstrum level for arm: Stage II Synergy is developing Brunstrum level for hand: Stage III Synergies performed voluntarily LUE Assessment LUE Assessment: Exceptions to [facility] Passive Range of Motion (PROM) Comments: WFL Active Range of Motion (AROM) Comments: limited shoulder ROM, elbow flexion/extension WFL General Strength Comments: 3+ to 4/5     Refer to  Care Plan for Long Term Goals  Recommendations for other services: Neuropsych   Discharge Criteria: Patient will be discharged from OT if patient refuses treatment 3 consecutive times without medical reason, if treatment goals not met, if there is a change in medical status, if patient makes no progress towards goals or if patient is discharged from hospital.  The above assessment, treatment plan, treatment alternatives and goals were discussed and mutually agreed upon: by patient  [place] Dense [facility] 01/31/2019, 11:05 AM

## 2019-01-31 NOTE — Progress Notes (Signed)
Ranelle Oyster, MD  Physician  Physical Medicine and Rehabilitation  Consult Note  Signed  Date of Service:  01/25/2019 8:10 AM       Related encounter: ED to Hosp-Admission (Discharged) from 01/13/2019 in [place] [facility] Progressive Care      Signed      Expand All Collapse All    Show:Clear all Manual[x] Template[] Copied  Added by: Angiulli, Mcarthur Rossetti, PA-C[x] Ranelle Oyster, MD  Hover for details      Physical Medicine and Rehabilitation Consult Reason for Consult: Right side weakness Referring Physician: critical care   HPI: Joel Rogers is a 68 y.o.right handed male with history of diastolic congestive heart failure, aortic stenosis status post TAVR 07/2018, atrial fibrillation maintained on Eliquis, hypertension. Per chart review patient lives with spouse. Community ambulator. Active and driving. One level home 3 steps to entry.  Patient presented to [facility] hospital 01/13/2019 secondary to right and left upper extremity pain findings of sepsis due to Streptococcus bacteremia and cellulitis as well as recent fall. During admission maintain on antibiotic therapy patient with persistent right side weakness. MRI the brain revealed multiple acute ischemic infarctions in a pattern most consistent with cardioembolic stroke. Patient was transferred to [facility] for further evaluation. Venous Doppler study showed no signs of DVT. Findings of elevated liver function studies ultrasound the abdomen showed no focal lesion identified. MRI the brain with numerous scattered foci of acute ischemia throughout the cerebrum and cerebellum. Many of these lesions suggestive of embolic infarction. No midline shift or mass effect. No proximal intracranial arterial occlusion. Echocardiogram with ejection fraction of 60%. Normal systolic function. Patient remained on Eliquis for history of atrial fibrillation with the addition of aspirin for CVA. Patient developed  coffee-ground emesis with acute blood loss anemia requiring transfusions 2 units pack red blood cells and gastroenterology services consulted EGD completed 2 with findings of red blood in the lower third of the esophagus clotted blood in the entire stomach. Blood thinners currently remained on hold patient is NPO and await plan for possible follow-up endoscopy. Therapy evaluations have been completed with recommendations of physical medicine rehabilitation consult.   Review of Systems  Constitutional: Positive for fever. Negative for chills.  HENT: Negative for hearing loss.   Eyes: Negative for blurred vision and double vision.  Respiratory: Positive for shortness of breath. Negative for cough.   Cardiovascular: Positive for palpitations and leg swelling.  Gastrointestinal: Positive for abdominal pain, constipation and nausea.  Genitourinary: Negative for dysuria, flank pain, hematuria and urgency.  Musculoskeletal: Positive for falls, joint pain and myalgias.  Skin: Negative for rash.  Neurological: Positive for dizziness, weakness and headaches. Negative for speech change and seizures.  Psychiatric/Behavioral: The patient has insomnia.   All other systems reviewed and are negative.      Past Medical History:  Diagnosis Date   Anemia    low iron   Arthritis    bilateral knees   Atrial fibrillation, chronic    Chronic diastolic CHF (congestive heart failure) (HCC) 06/15/2018   History of subdural hematoma    Hyperlipidemia    Hypertension    Morbid obesity (HCC)    Normal coronary arteries    by cardiac catheterization performed 03/14/06   Pre-diabetes    pt denies   S/P TAVR (transcatheter aortic valve replacement)    a. 07/25/18: Edwards Sapien 3 THV (size 26 mm, model # 9600TFX, serial # [id number]) by Dr. Laneta Simmers and Dr. Clifton James   Sleep  apnea    on CPAP   Venous insufficiency         Past Surgical History:  Procedure Laterality Date    CRANIOTOMY Right 08/27/2016   Procedure: CRANIOTOMY HEMATOMA EVACUATION SUBDURAL;  Surgeon: Maeola Harman, MD;  Location: [facility] OR;  Service: Neurosurgery;  Laterality: Right;   ESOPHAGOGASTRODUODENOSCOPY N/A 01/21/2019   Procedure: ESOPHAGOGASTRODUODENOSCOPY (EGD);  Surgeon: Vida Rigger, MD;  Location: [facility] ENDOSCOPY;  Service: Endoscopy;  Laterality: N/A;   ESOPHAGOGASTRODUODENOSCOPY (EGD) WITH PROPOFOL N/A 01/22/2019   Procedure: ESOPHAGOGASTRODUODENOSCOPY (EGD) WITH PROPOFOL;  Surgeon: Bernette Redbird, MD;  Location: [facility] ENDOSCOPY;  Service: Endoscopy;  Laterality: N/A;   GASTRIC BYPASS  09/2008   JOINT REPLACEMENT  08/31/2006   right hip   MULTIPLE EXTRACTIONS WITH ALVEOLOPLASTY N/A 07/13/2018   Procedure: Extraction of tooth #'s 3,8,10, 23-26, 30and 32 with alveoloplasty and gross debridement of remaining teeth;  Surgeon: Charlynne Pander, DDS;  Location: [facility] OR;  Service: Oral Surgery;  Laterality: N/A;   RIGHT HEART CATH N/A 07/06/2018   Procedure: RIGHT HEART CATH;  Surgeon: Kathleene Hazel, MD;  Location: [facility] INVASIVE CV LAB;  Service: Cardiovascular;  Laterality: N/A;   RIGHT/LEFT HEART CATH AND CORONARY ANGIOGRAPHY N/A 07/10/2018   Procedure: RIGHT/LEFT HEART CATH AND CORONARY ANGIOGRAPHY;  Surgeon: Dolores Patty, MD;  Location: [facility] INVASIVE CV LAB;  Service: Cardiovascular;  Laterality: N/A;   TEE WITHOUT CARDIOVERSION N/A 07/25/2018   Procedure: TRANSESOPHAGEAL ECHOCARDIOGRAM (TEE);  Surgeon: Kathleene Hazel, MD;  Location: [facility] OR;  Service: Open Heart Surgery;  Laterality: N/A;   TEE WITHOUT CARDIOVERSION N/A 01/18/2019   Procedure: TRANSESOPHAGEAL ECHOCARDIOGRAM (TEE) WITH PROPOFOL;  Surgeon: Jonelle Sidle, MD;  Location: [facility] ORS;  Service: Cardiovascular;  Laterality: N/A;   TOTAL HIP ARTHROPLASTY     right hip   TRANSCATHETER AORTIC VALVE REPLACEMENT, TRANSFEMORAL  07/25/2018   TRANSCATHETER AORTIC VALVE REPLACEMENT, TRANSFEMORAL N/A 07/25/2018     Procedure: TRANSCATHETER AORTIC VALVE REPLACEMENT, TRANSFEMORAL;  Surgeon: Kathleene Hazel, MD;  Location: [facility] OR;  Service: Open Heart Surgery;  Laterality: N/A;        Family History  Problem Relation Age of Onset   Hypertension Father    Cancer Father        prob prostate   Alzheimer's disease Mother    Alzheimer's disease Maternal Grandfather    Breast cancer Sister    Cancer Brother        "brain tumor"   Social History:  reports that he has quit smoking. He has never used smokeless tobacco. He reports that he does not drink alcohol or use drugs. Allergies: No Known Allergies       Medications Prior to Admission  Medication Sig Dispense Refill   acetaminophen (TYLENOL) 650 MG CR tablet Take 650 mg by mouth 2 (two) times daily as needed for pain.     amoxicillin (AMOXIL) 500 MG capsule Take 2,000 mg (4 tablets) one hour prior to dental visits. 8 capsule 3   apixaban (ELIQUIS) 5 MG TABS tablet Take 1 tablet (5 mg total) by mouth 2 (two) times daily. 180 tablet 1   aspirin 81 MG chewable tablet Chew 1 tablet (81 mg total) by mouth daily.     atorvastatin (LIPITOR) 80 MG tablet TAKE 1 TABLET BY MOUTH ONCE DAILY WITH  BREAKFAST 90 tablet 1   colchicine 0.6 MG tablet TAKE 1 TABLET BY MOUTH TWICE DAILY 60 tablet 2   diltiazem (CARDIZEM CD) 120 MG 24 hr capsule Take 1  capsule (120 mg total) by mouth daily. 90 capsule 1   ergocalciferol (VITAMIN D2) 1.25 MG (50000 UT) capsule Take 50,000 Units by mouth once a week.     spironolactone (ALDACTONE) 25 MG tablet Take 0.5 tablets (12.5 mg total) by mouth daily. 30 tablet 5   torsemide (DEMADEX) 20 MG tablet Take 40 mg by mouth as needed. Use as needed if weight is over 245 pounds.      Home: Home Living Family/patient expects to be discharged to:: Private residence Living Arrangements: Spouse/significant other Available Help at Discharge: Family, Available 24 hours/day Type of Home:  House Home Access: Stairs to enter Secretary/administrator of Steps: 3 Entrance Stairs-Rails: Right Home Layout: One level Bathroom Shower/Tub: Tub/shower unit, Health visitor: Handicapped height Home Equipment: Environmental consultant - 2 wheels, Medical laboratory scientific officer - single point  Lives With: Spouse  Functional History: Prior Function Level of Independence: Independent Comments: community ambulator, drives, was actively going to [employer] and using the pool there Functional Status:  Mobility: Bed Mobility Overal bed mobility: Needs Assistance Bed Mobility: Supine to Sit, Sit to Supine Supine to sit: Max assist, +2 for physical assistance, +2 for safety/equipment, HOB elevated Sit to supine: +2 for physical assistance, Max assist General bed mobility comments: increased time and effort, heavy assistance needed for R LE movement off of bed, heavy assistance with trunk management Transfers Overall transfer level: Needs assistance Equipment used: Rolling walker (2 wheeled) Transfer via Lift Equipment: [employer] Transfers: Sit to/from Stand, [employer] Transfers Sit to Stand: Mod assist, Max assist Stand pivot transfers: Mod assist, Max assist General transfer comment: mod to max to come to sitting once sitting can hold himself on EOB without external support. using LUE to hold onto rail. worked with balance and R UE +R LE exercises EOB Ambulation/Gait Ambulation/Gait assistance: Max Chemical engineer (Feet): 4 Feet Assistive device: Rolling walker (2 wheeled) Gait Pattern/deviations: Decreased step length - right, Decreased step length - left, Decreased stride length General Gait Details: limited to 7-8 slow labored side steps due to BLE weakness Gait velocity: slow  ADL: ADL Overall ADL's : Needs assistance/impaired Eating/Feeding: NPO Eating/Feeding Details (indicate cue type and reason): except ice chips, setup to place cup in hand Grooming: Min guard, Set up, Bed level, Wash/dry face,  Sitting Grooming Details (indicate cue type and reason): pt with assist to apply lotion to back Upper Body Bathing: Minimal assistance, Sitting Lower Body Bathing: Maximal assistance, +2 for physical assistance, +2 for safety/equipment, Sitting/lateral leans Upper Body Dressing : Moderate assistance, Sitting Lower Body Dressing: +2 for physical assistance, +2 for safety/equipment, Sitting/lateral leans, Maximal assistance, Total assistance Lower Body Dressing Details (indicate cue type and reason): assist to don socks at bed level Toileting- Clothing Manipulation and Hygiene: Total assistance, +2 for physical assistance, +2 for safety/equipment, Bed level Functional mobility during ADLs: Maximal assistance, Total assistance, +2 for physical assistance, +2 for safety/equipment General ADL Comments: pt able to sit EOB today and then use of maxisky for OOB to chair today, continues to have increased RLE pain (mostly in hip region), also with some limitations due to nausea  Cognition: Cognition Overall Cognitive Status: Impaired/Different from baseline Arousal/Alertness: Awake/alert Orientation Level: Oriented X4 Attention: Selective Selective Attention: Impaired Selective Attention Impairment: Verbal basic Memory: Impaired Memory Impairment: Storage deficit, Retrieval deficit, Decreased recall of new information Awareness: Impaired Awareness Impairment: Anticipatory impairment Cognition Arousal/Alertness: Awake/alert Behavior During Therapy: WFL for tasks assessed/performed Overall Cognitive Status: Impaired/Different from baseline General Comments: pt is very  sweet, pleasant and motivated to work with therapy  Blood pressure (!) 147/78, pulse (!) 52, temperature 98 F (36.7 C), temperature source Oral, resp. rate 12, height 6\' 1"  (1.854 m), weight 128.1 kg, SpO2 99 %. Physical Exam  Constitutional: He appears well-developed. No distress.  HENT:  Head: Normocephalic.  Eyes: Pupils  are equal, round, and reactive to light.  Neck: Normal range of motion.  Cardiovascular: Normal rate.  Respiratory: Effort normal.  GI: Soft.  Musculoskeletal:        General: No edema.  Neurological:  Patient is alert. He makes good eye contact with examiner. Follows full commands. He provides his name and age as well as date of birth. RUE and RLE 3+ to 4/5 with decreased FMC. Senses pain.   Skin: Skin is warm.  Psychiatric:  Delays in processing. Fair insight    LabResultsLast24Hours       Results for orders placed or performed during the hospital encounter of 01/13/19 (from the past 24 hour(s))  Glucose, capillary     Status: Abnormal   Collection Time: 01/24/19  7:28 PM  Result Value Ref Range   Glucose-Capillary 116 (H) 70 - 99 mg/dL  Comprehensive metabolic panel     Status: Abnormal   Collection Time: 01/25/19  4:18 AM  Result Value Ref Range   Sodium 147 (H) 135 - 145 mmol/L   Potassium 3.8 3.5 - 5.1 mmol/L   Chloride 118 (H) 98 - 111 mmol/L   CO2 21 (L) 22 - 32 mmol/L   Glucose, Bld 116 (H) 70 - 99 mg/dL   BUN 44 (H) 8 - 23 mg/dL   Creatinine, Ser 1.611.50 (H) 0.61 - 1.24 mg/dL   Calcium 8.1 (L) 8.9 - 10.3 mg/dL   Total Protein 6.4 (L) 6.5 - 8.1 g/dL   Albumin 2.1 (L) 3.5 - 5.0 g/dL   AST 22 15 - 41 U/L   ALT 7 0 - 44 U/L   Alkaline Phosphatase 72 38 - 126 U/L   Total Bilirubin 1.3 (H) 0.3 - 1.2 mg/dL   GFR calc non Af Amer 47 (L) >60 mL/min   GFR calc Af Amer 55 (L) >60 mL/min   Anion gap 8 5 - 15  Magnesium     Status: Abnormal   Collection Time: 01/25/19  4:18 AM  Result Value Ref Range   Magnesium 2.6 (H) 1.7 - 2.4 mg/dL  CBC with Differential/Platelet     Status: Abnormal   Collection Time: 01/25/19  4:18 AM  Result Value Ref Range   WBC 16.8 (H) 4.0 - 10.5 K/uL   RBC 3.28 (L) 4.22 - 5.81 MIL/uL   Hemoglobin 8.8 (L) 13.0 - 17.0 g/dL   HCT 09.627.2 (L) 04.539.0 - 40.952.0 %   MCV 82.9 80.0 - 100.0 fL   MCH 26.8 26.0 - 34.0 pg    MCHC 32.4 30.0 - 36.0 g/dL   RDW 81.122.0 (H) 91.411.5 - 78.215.5 %   Platelets 145 (L) 150 - 400 K/uL   nRBC 1.1 (H) 0.0 - 0.2 %   Neutrophils Relative % 91 %   Neutro Abs 15.3 (H) 1.7 - 7.7 K/uL   Lymphocytes Relative 5 %   Lymphs Abs 0.8 0.7 - 4.0 K/uL   Monocytes Relative 2 %   Monocytes Absolute 0.3 0.1 - 1.0 K/uL   Eosinophils Relative 0 %   Eosinophils Absolute 0.0 0.0 - 0.5 K/uL   Basophils Relative 0 %   Basophils Absolute 0.0 0.0 - 0.1 K/uL  nRBC 0 0 /100 WBC   Promyelocytes Relative 2 %   Abs Immature Granulocytes 0.30 (H) 0.00 - 0.07 K/uL   Burr Cells PRESENT    Polychromasia PRESENT    Target Cells PRESENT   Phosphorus     Status: None   Collection Time: 01/25/19  4:18 AM  Result Value Ref Range   Phosphorus 3.1 2.5 - 4.6 mg/dL      [id number])  US Renal  Result Date: 01/24/2019 CLINICAL DATA:  Acute renal injury EXAM: RENAL / URINARY TRACT ULTRASOUND COMPLETE COMPARISON:  CT from 07/11/2008 FINDINGS: Right Kidney: Renal measurements: 12.7 x 6.1 x 5.7 cm. = volume: 236 mL. Lobulations are noted consistent with focal scarring seen on prior CT examination. No mass lesion or hydronephrosis is noted. Left Kidney: Renal measurements: 13.2 x 6.3 x 6.1 cm = volume: 272 mL. Echogenicity within normal limits. No mass or hydronephrosis visualized. Bladder: Appears normal for degree of bladder distention. IMPRESSION: Changes of scarring within the right kidney. No acute abnormality is noted. Electronically Signed   By: Alcide Clever M.D.   On: 01/24/2019 07:15      Assessment/Plan: Diagnosis: 68 yo male admitted with sepsis who suffered subsequent cardio-embolic bi-cerebral infarcts 1. Does the need for close, 24 hr/day medical supervision in concert with the patient's rehab needs make it unreasonable for this patient to be served in a less intensive setting? Yes 2. Co-Morbidities requiring supervision/potential complications: sepsis, ID  considerations, UGIB, atrial fib 3. Due to bladder management, bowel management, safety, skin/wound care, disease management and medication administration, does the patient require 24 hr/day rehab nursing? Yes 4. Does the patient require coordinated care of a physician, rehab nurse, PT (1-2 hrs/day, 5 days/week), OT (1-2 hrs/day, 5 days/week) and SLP (1-2 hrs/day, 5 days/week) to address physical and functional deficits in the context of the above medical diagnosis(es)? Yes Addressing deficits in the following areas: balance, endurance, locomotion, strength, transferring, bowel/bladder control, bathing, dressing, feeding, grooming, toileting, cognition, speech and psychosocial support 5. Can the patient actively participate in an intensive therapy program of at least 3 hrs of therapy per day at least 5 days per week? Yes 6. The potential for patient to make measurable gains while on inpatient rehab is excellent 7. Anticipated functional outcomes upon discharge from inpatient rehab are supervision and min assist  with PT, supervision and min assist with OT, modified independent and supervision with SLP. 8. Estimated rehab length of stay to reach the above functional goals is: 12-16 days 9. Anticipated D/C setting: Home 10. Anticipated post D/C treatments: HH therapy 11. Overall Rehab/Functional Prognosis: excellent  RECOMMENDATIONS: This patient's condition is appropriate for continued rehabilitative care in the following setting: CIR Patient has agreed to participate in recommended program. Yes Note that insurance prior authorization may be required for reimbursement for recommended care.  Comment: Rehab Admissions Coordinator to follow up.  Thanks,  Ranelle Oyster, MD, [place] [facility]  I have personally performed a face to face diagnostic evaluation of this patient. Additionally, I have examined pertinent labs and radiographic images. I have reviewed and concur with the physician assistant's  documentation above.    Mcarthur Rossetti Angiulli, PA-C 01/25/2019        Revision History                        Routing History

## 2019-01-31 NOTE — Progress Notes (Signed)
Per nursing, patient was given "Data Collection Information Summary for Patients in Inpatient Rehabilitation Facilities with attached Privacy Act Statement Health Care Records" upon admission.    Patient information reviewed and entered into e[facility] System by Becky Cletus Mehlhoff, PPS coordinator. Information including medical coding, function ability, and quality indicators will be reviewed and updated through discharge.   

## 2019-01-31 NOTE — Progress Notes (Signed)
Social Work  Social Work Assessment and Plan  Patient Details  Name: Joel Rogers MRN: [id number] Date of Birth: [birth year]  Today's Date: 01/31/2019  Problem List:  Patient Active Problem List   Diagnosis Date Noted  . Acute bilateral cerebral infarction in a watershed distribution [facility]) 01/30/2019  . UGIB (upper gastrointestinal bleed) 01/26/2019  . Bacteremia due to Streptococcus 01/26/2019  . Cellulitis 01/26/2019  . Upper GI bleed   . Acute CVA (cerebrovascular accident) (HCC) 01/19/2019  . Acute renal failure (HCC) 01/14/2019  . Right shoulder pain 01/14/2019  . Sepsis (HCC) 01/13/2019  . S/P TAVR (transcatheter aortic valve replacement) 07/25/2018  . History of subdural hematoma   . Atrial fibrillation, chronic   . Acute on chronic diastolic heart failure (HCC) 06/15/2018  . Pulmonary hypertension, unspecified (HCC) 02/15/2018  . Varicose veins of right lower extremity with complications 01/10/2018  . History of Severe aortic stenosis 08/09/2017  . Vitamin D deficiency 12/02/2016  . Status post gastric bypass for obesity 11/02/2016  . HTN (hypertension) 04/13/2013  . Hyperlipemia 04/13/2013  . Chronic atrial fibrillation 02/14/2013  . Morbid obesity (HCC) 06/18/2008  . Obstructive sleep apnea 06/18/2008   Past Medical History:  Past Medical History:  Diagnosis Date  . Anemia    low iron  . Arthritis    bilateral knees  . Atrial fibrillation, chronic   . Chronic diastolic CHF (congestive heart failure) (HCC) 06/15/2018  . History of subdural hematoma   . Hyperlipidemia   . Hypertension   . Morbid obesity (HCC)   . Normal coronary arteries    by cardiac catheterization performed 03/14/06  . Pre-diabetes    pt denies  . S/P TAVR (transcatheter aortic valve replacement)    a. 07/25/18: Edwards Sapien 3 THV (size 26 mm, model # 9600TFX, serial # [id number]) by Dr. Laneta Simmers and Dr. Clifton James  . Sleep apnea    on CPAP  . Venous insufficiency    Past Surgical  History:  Past Surgical History:  Procedure Laterality Date  . BIOPSY  01/26/2019   Procedure: BIOPSY;  Surgeon: Bernette Redbird, MD;  Location[facility] ENDOSCOPY;  Service: Endoscopy;;  . CRANIOTOMY Right 08/27/2016   Procedure: CRANIOTOMY HEMATOMA EVACUATION SUBDURAL;  Surgeon: Maeola Harman, MD;  Location[facility] OR;  Service: Neurosurgery;  Laterality: Right;  . ESOPHAGOGASTRODUODENOSCOPY N/A 01/21/2019   Procedure: ESOPHAGOGASTRODUODENOSCOPY (EGD);  Surgeon: Vida Rigger, MD;  Location: [facility] ENDOSCOPY;  Service: Endoscopy;  Laterality: N/A;  . ESOPHAGOGASTRODUODENOSCOPY (EGD) WITH PROPOFOL N/A 01/22/2019   Procedure: ESOPHAGOGASTRODUODENOSCOPY (EGD) WITH PROPOFOL;  Surgeon: Bernette Redbird, MD;  Location: [facility] ENDOSCOPY;  Service: Endoscopy;  Laterality: N/A;  . ESOPHAGOGASTRODUODENOSCOPY (EGD) WITH PROPOFOL N/A 01/26/2019   Procedure: ESOPHAGOGASTRODUODENOSCOPY (EGD) WITH PROPOFOL;  Surgeon: Bernette Redbird, MD;  Location: [facility] ENDOSCOPY;  Service: Endoscopy;  Laterality: N/A;  . GASTRIC BYPASS  09/2008  . JOINT REPLACEMENT  08/31/2006   right hip  . MULTIPLE EXTRACTIONS WITH ALVEOLOPLASTY N/A 07/13/2018   Procedure: Extraction of tooth #'s 3,8,10, 23-26, 30and 32 with alveoloplasty and gross debridement of remaining teeth;  Surgeon: Charlynne Pander, DDS;  Location: [facility] OR;  Service: Oral Surgery;  Laterality: N/A;  . RIGHT HEART CATH N/A 07/06/2018   Procedure: RIGHT HEART CATH;  Surgeon: Kathleene Hazel, MD;  Location: [facility] INVASIVE CV LAB;  Service: Cardiovascular;  Laterality: N/A;  . RIGHT/LEFT HEART CATH AND CORONARY ANGIOGRAPHY N/A 07/10/2018   Procedure: RIGHT/LEFT HEART CATH AND CORONARY ANGIOGRAPHY;  Surgeon: Dolores Patty, MD;  Location: [facility] INVASIVE  CV LAB;  Service: Cardiovascular;  Laterality: N/A;  . TEE WITHOUT CARDIOVERSION N/A 07/25/2018   Procedure: TRANSESOPHAGEAL ECHOCARDIOGRAM (TEE);  Surgeon: Kathleene Hazel, MD;  Location: [facility] OR;  Service: Open Heart Surgery;  Laterality:  N/A;  . TEE WITHOUT CARDIOVERSION N/A 01/18/2019   Procedure: TRANSESOPHAGEAL ECHOCARDIOGRAM (TEE) WITH PROPOFOL;  Surgeon: Jonelle Sidle, MD;  Location: [facility] ORS;  Service: Cardiovascular;  Laterality: N/A;  . TOTAL HIP ARTHROPLASTY     right hip  . TRANSCATHETER AORTIC VALVE REPLACEMENT, TRANSFEMORAL  07/25/2018  . TRANSCATHETER AORTIC VALVE REPLACEMENT, TRANSFEMORAL N/A 07/25/2018   Procedure: TRANSCATHETER AORTIC VALVE REPLACEMENT, TRANSFEMORAL;  Surgeon: Kathleene Hazel, MD;  Location: [facility] OR;  Service: Open Heart Surgery;  Laterality: N/A;   Social History:  reports that he has quit smoking. He has never used smokeless tobacco. He reports that he does not drink alcohol or use drugs.  Family / Support Systems Marital Status: Married Patient Roles: Spouse, Parent, Other (Comment)(minister/preacher) Spouse/Significant Other: Delaney Meigs  [phone] Children: Son and daughter both local Other Supports: friends and church members Anticipated Caregiver: Wife Ability/Limitations of Caregiver: Wife is in good health and can assist Caregiver Availability: 24/7 Family Dynamics: Close knit family both children are involved and supportive. He has many supports via church members and community members. He is planning on getting to independent level and not need assist.  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 Wife plans to be here daily to provide support.   Abuse/Neglect Abuse/Neglect Assessment Can Be Completed: Yes Physical Abuse: Denies Verbal Abuse:  Denies Sexual Abuse: Denies Exploitation of patient/patient's resources: Denies Self-Neglect: Denies  Emotional Status Pt's affect, behavior and adjustment status: Pt is motivated and plans to be mod/i before leaving rehab. He has always been one to be able to care for himself and others. He does not want to burden his wife and will do all that he can to recover from this stroke. Recent Psychosocial Issues: other health issues was admitted to the hospital for what he thought was the flu and all this happened while here Psychiatric History: No history deferred depression screen due to coping appropriately and relies upon his faith to pull him through. Will monitor and see if would benefit from seeing neuro-psych while here due to his position needing to always feel upbeat and positive. Substance Abuse History: No issues  Patient / Family Perceptions, Expectations & Goals Pt/Family understanding of illness & functional limitations: Pt and wife have a good understanding of his stroke and deficits, he talks with the MD daily and feels his questions are answered and plan for treatment going forward. He is not one who doesn't say what is on his mind. Premorbid pt/family roles/activities: Husband, father, grandfather, Programmer, multimedia, retiree, etc Anticipated changes in roles/activities/participation: resume Pt/family expectations/goals: Pt states: " I plan on being independent when I leave here, I don't want to be a burden to my wife." Wife states: " I will do whatever he needs, but hope he will be doing well on his own."  [employer]: None Premorbid Home Care/DME Agencies: Other (Comment)(rw, bsc, wc, power chair, etc) Transportation available at  discharge: Both he and wife drive Resource referrals recommended: Support group (specify)  Discharge Planning Living Arrangements: Spouse/significant other Support Systems: Spouse/significant other, Children, Other relatives,  Friends/neighbors, Church/faith community Type of Residence: Private residence Insurance Resources: Media planner (specify)(UHC-Medicare) Financial Resources: Social Security, Employment Financial Screen Referred: No Living Expenses: Own Money Management: Spouse, Patient Does the patient have any problems obtaining your medications?: No Home Management: Wife, pt reports he does all of the cooking Patient/Family Preliminary Plans: Return home with wife who is able to assist and is in good health. Pt is hopeful he will do well and not need his wife to assist at discharge. Will await team evaluations and work on discharge needs.  Social Work Anticipated Follow Up Needs: HH/OP, Support Group  Clinical Impression Pleasant gentleman who is motivated to do well and recover from his stroke. He is starting to get movement back which is encouraging to him. His wife and children are involved and supportive. Will await team's evaluations and work on discharge needs. Will ask input from team regarding neuro-psych seeing while here.  Lucy Chris 01/31/2019, 10:24 AM

## 2019-01-31 NOTE — Discharge Instructions (Signed)
Inpatient Rehab Discharge Instructions  Joel Rogers Discharge date and time: No discharge date for patient encounter.   Activities/Precautions/ Functional Status: Activity: activity as tolerated Diet: soft Wound Care: none needed Functional status:  ___ No restrictions     ___ Walk up steps independently ___ 24/7 supervision/assistance   ___ Walk up steps with assistance ___ Intermittent supervision/assistance  ___ Bathe/dress independently ___ Walk with walker     _x__ Bathe/dress with assistance ___ Walk Independently    ___ Shower independently ___ Walk with assistance    ___ Shower with assistance ___ No alcohol     ___ Return to work/school ________  Special Instructions: No driving smoking or alcohol STROKE/TIA DISCHARGE INSTRUCTIONS SMOKING Cigarette smoking nearly doubles your risk of having a stroke & is the single most alterable risk factor  If you smoke or have smoked in the last 12 months, you are advised to quit smoking for your health.  Most of the excess cardiovascular risk related to smoking disappears within a year of stopping.  Ask you doctor about anti-smoking medications  [place] Quit Line: 1-800-QUIT NOW  Free Smoking Cessation Classes [phone]  CHOLESTEROL Know your levels; limit fat & cholesterol in your diet  Lipid Panel     Component Value Date/Time   CHOL 94 01/21/2019 0538   TRIG 67 01/21/2019 0538   HDL 11 (L) 01/21/2019 0538   CHOLHDL 8.5 01/21/2019 0538   VLDL 13 01/21/2019 0538   LDLCALC 70 01/21/2019 0538   LDLCALC 70 05/01/2018 0913      Many patients benefit from treatment even if their cholesterol is at goal.  Goal: Total Cholesterol (CHOL) less than 160  Goal:  Triglycerides (TRIG) less than 150  Goal:  HDL greater than 40  Goal:  LDL (LDLCALC) less than 100   BLOOD PRESSURE American Stroke Association blood pressure target is less that 120/80 mm/Hg  Your discharge blood pressure is:  BP: 137/75  Monitor your blood  pressure  Limit your salt and alcohol intake  Many individuals will require more than one medication for high blood pressure  DIABETES (A1c is a blood sugar average for last 3 months) Goal HGBA1c is under 7% (HBGA1c is blood sugar average for last 3 months)  Diabetes: No known diagnosis of diabetes    Lab Results  Component Value Date   HGBA1C 5.8 (H) 07/21/2018     Your HGBA1c can be lowered with medications, healthy diet, and exercise.  Check your blood sugar as directed by your physician  Call your physician if you experience unexplained or low blood sugars.  PHYSICAL ACTIVITY/REHABILITATION Goal is 30 minutes at least 4 days per week  Activity: Increase activity slowly, Therapies: Physical Therapy: Home Health Return to work:   Activity decreases your risk of heart attack and stroke and makes your heart stronger.  It helps control your weight and blood pressure; helps you relax and can improve your mood.  Participate in a regular exercise program.  Talk with your doctor about the best form of exercise for you (dancing, walking, swimming, cycling).  DIET/WEIGHT Goal is to maintain a healthy weight  Your discharge diet is:  Diet Order            DIET SOFT Room service appropriate? Yes; Fluid consistency: Thin  Diet effective now              liquids Your height is:  Height: 6\' 1"  (185.4 cm) Your current weight is: Weight: 126.7 kg Your  Body Mass Index (BMI) is:  BMI (Calculated): 36.86  Following the type of diet specifically designed for you will help prevent another stroke.  Your goal weight range is:    Your goal Body Mass Index (BMI) is 19-24.  Healthy food habits can help reduce 3 risk factors for stroke:  High cholesterol, hypertension, and excess weight.  RESOURCES Stroke/Support Group:  Call [phone]   STROKE EDUCATION PROVIDED/REVIEWED AND GIVEN TO PATIENT Stroke warning signs and symptoms How to activate emergency medical system (call  [phone]). Medications prescribed at discharge. Need for follow-up after discharge. Personal risk factors for stroke. Pneumonia vaccine given:  Flu vaccine given:  My questions have been answered, the writing is legible, and I understand these instructions.  I will adhere to these goals & educational materials that have been provided to me after my discharge from the hospital.      My questions have been answered and I understand these instructions. I will adhere to these goals and the provided educational materials after my discharge from the hospital.  Patient/Caregiver Signature _______________________________ Date __________  Clinician Signature _______________________________________ Date __________  Please bring this form and your medication list with you to all your follow-up doctor's appointments.

## 2019-02-01 ENCOUNTER — Inpatient Hospital Stay (HOSPITAL_COMMUNITY): Payer: Self-pay | Admitting: Occupational Therapy

## 2019-02-01 ENCOUNTER — Inpatient Hospital Stay (HOSPITAL_COMMUNITY): Payer: Self-pay | Admitting: Physical Therapy

## 2019-02-01 ENCOUNTER — Inpatient Hospital Stay (HOSPITAL_COMMUNITY): Payer: Self-pay | Admitting: Speech Pathology

## 2019-02-01 NOTE — Plan of Care (Signed)
  Problem: Consults Goal: RH STROKE PATIENT EDUCATION Description See Patient Education module for education specifics  Outcome: Progressing   Problem: RH BOWEL ELIMINATION Goal: RH STG MANAGE BOWEL WITH ASSISTANCE Description STG Manage Bowel with Mod.Assistance.  Outcome: Progressing Goal: RH STG MANAGE BOWEL W/MEDICATION W/ASSISTANCE Description STG Manage Bowel with Medication with Mod.Assistance.  Outcome: Progressing   Problem: RH BLADDER ELIMINATION Goal: RH STG MANAGE BLADDER WITH ASSISTANCE Description STG Manage Bladder With Mod.Assistance  Outcome: Progressing   Problem: RH SKIN INTEGRITY Goal: RH STG SKIN FREE OF INFECTION/BREAKDOWN Description With Mod. assist  Outcome: Progressing Goal: RH STG MAINTAIN SKIN INTEGRITY WITH ASSISTANCE Description STG Maintain Skin Integrity With Mod. Assistance.  Outcome: Progressing   Problem: RH SAFETY Goal: RH STG ADHERE TO SAFETY PRECAUTIONS W/ASSISTANCE/DEVICE Description STG Adhere to Safety Precautions With Max.Assistance/Device.  Outcome: Progressing   Problem: RH PAIN MANAGEMENT Goal: RH STG PAIN MANAGED AT OR BELOW PT'S PAIN GOAL Description Less than 3  Outcome: Progressing   Problem: RH KNOWLEDGE DEFICIT Goal: RH STG INCREASE KNOWLEDGE OF STROKE PROPHYLAXIS Outcome: Progressing

## 2019-02-01 NOTE — Progress Notes (Signed)
[facility] PHYSICAL MEDICINE & REHABILITATION PROGRESS NOTE   Subjective/Complaints:  No issues with bowel, discussed swelling in LE wore TEDs at home  ROS- denies CP, SOB, N/V/D, has good sensation for bladder  Objective:   No results found. Recent Labs    01/30/19 0830 01/31/19 0612  WBC 16.7* 14.8*  HGB 9.3* 8.8*  HCT 29.1* 27.2*  PLT 121* 130*   Recent Labs    01/30/19 0830 01/31/19 0612  NA 135 137  K 3.4* 3.5  CL 108 108  CO2 24 26  GLUCOSE 130* 106*  BUN 10 11  CREATININE 1.01 0.93  CALCIUM 8.1* 8.2*    Intake/Output Summary (Last 24 hours) at 02/01/2019 0904 Last data filed at 02/01/2019 0200 Gross per 24 hour  Intake 490 ml  Output 1550 ml  Net -1060 ml     Physical Exam: Vital Signs Blood pressure 131/84, pulse 60, temperature 98.2 F (36.8 C), temperature source Oral, resp. rate 18, height 6\' 1"  (1.854 m), weight 126.7 kg, SpO2 95 %.   General: No acute distress Mood and affect are appropriate Heart: IRRR  no rubs murmurs or extra sounds Lungs: Clear to auscultation, breathing unlabored, no rales or wheezes Abdomen: Positive bowel sounds, soft nontender to palpation, nondistended Extremities: No clubbing, cyanosis, or edema Skin: No evidence of breakdown, no evidence of rash Neurologic: Cranial nerves II through XII intact, motor strength is 5/5 in left deltoid, bicep, tricep, grip,2- bilateral hip flexor, knee extensors, ankle dorsiflexor and plantar flexor 2- R delt, 4/5 R Biceps, tricep, grip Cerebellar exam normal finger to nose to finger LUE, cannot assess in LE and RUE d/t weakness Ext 2+ edema with stasis dermatitis changes Musculoskeletal: Full range of motion in all 4 extremities. No joint swelling   Assessment/Plan: 1. Functional deficits secondary to Right hemiparesis, and LLE monoparesis which require 3+ hours per day of interdisciplinary therapy in a comprehensive inpatient rehab setting.  Physiatrist is providing close team  supervision and 24 hour management of active medical problems listed below.  Physiatrist and rehab team continue to assess barriers to discharge/monitor patient progress toward functional and medical goals  Care Tool:  Bathing  Bathing activity did not occur: Safety/medical concerns Body parts bathed by patient: Left arm, Chest, Abdomen         Bathing assist Assist Level: (+2 at bed level)     Upper Body Dressing/Undressing Upper body dressing   What is the patient wearing?: Pull over shirt    Upper body assist Assist Level: Maximal Assistance - Patient 25 - 49%    Lower Body Dressing/Undressing Lower body dressing      What is the patient wearing?: Pants     Lower body assist Assist for lower body dressing: 2 Helpers     Toileting Toileting    Toileting assist Assist for toileting: Maximal Assistance - Patient 25 - 49%     Transfers Chair/bed transfer  Transfers assist  Chair/bed transfer activity did not occur: Safety/medical concerns        Locomotion Ambulation   Ambulation assist   Ambulation activity did not occur: Safety/medical concerns          Walk 10 feet activity   Assist  Walk 10 feet activity did not occur: Safety/medical concerns        Walk 50 feet activity   Assist Walk 50 feet with 2 turns activity did not occur: Safety/medical concerns         Walk 150 feet activity  Assist Walk 150 feet activity did not occur: Safety/medical concerns         Walk 10 feet on uneven surface  activity   Assist Walk 10 feet on uneven surfaces activity did not occur: Safety/medical concerns         Wheelchair     Assist     Wheelchair activity did not occur: Safety/medical concerns         Wheelchair 50 feet with 2 turns activity    Assist    Wheelchair 50 feet with 2 turns activity did not occur: Safety/medical concerns       Wheelchair 150 feet activity     Assist Wheelchair 150 feet  activity did not occur: Safety/medical concerns        Medical Problem List and Plan: 1.Persistent right side weakness with decreased functional mobilitysecondary tocardioembolicbi-cerebral infarctions CIR PT, OT 2. Antithrombotics: -DVT/anticoagulation:SCDs.Dopplers negative for DVT -eliquis on hold due to GIB -antiplatelet therapy: ASA on hold d/t GIB Keep PICC until f/u CBC after Eliquis resumed, may need transfusion 3. Pain Management:Tylenol as needed 4. Mood:Provide emotional support -antipsychotic agents: None 5. Neuropsych: This patientiscapable of making decisions on hisown behalf. 6. Skin/Wound Care:Routine skin checks 7. Fluids/Electrolytes/Nutrition:Routine in and out's with follow-up chemistries 8. Right lower extremity cellulitis with sepsis/Streptococcus bacteremia. Intravenous Ancef completed 01/30/2019 also with chronic stasis dermatitis, add Ted hose 9. Upper GI bleed. EGD 3. Follow-up gastroenterology services. Continue PPI.Resume anticoag on Monday per Neuro10. Acute blood loss anemia. Patient has been transfused to do 6 units pack red blood cells. Follow-up CBC. 11. Chronic atrial fibrillation as well as history of TAVR procedure.Diltiazem 120 mg daily.Cardiac rate controlled. 3/12 Vitals:   01/31/19 1942 02/01/19 0542  BP: 132/77 131/84  Pulse: 66 60  Resp: 18 18  Temp: 98.1 F (36.7 C) 98.2 F (36.8 C)  SpO2: 100% 95%   12. Hypertension.Aldactone 12.5 mg daily. Monitor with increased mobility Controlled 3/12 13. Hyperlipidemia. Lipitor 14. OSA. CPAP 15. Gout left great toe: prednisone 10mg  bid x 4 doses    LOS: 2 days A FACE TO FACE EVALUATION WAS PERFORMED  Erick Colace 02/01/2019, 9:04 AM

## 2019-02-01 NOTE — Progress Notes (Signed)
Physical Therapy Session Note  Patient Details  Name: Joel Rogers MRN: [id number] Date of Birth: 06-05-1951  Today's Date: 02/01/2019 PT Individual Time: 0800-0900 AND 1520-1545.  PT Individual Time Calculation (min): 60 min and 25 min   Short Term Goals: Week 1:  PT Short Term Goal 1 (Week 1): Pt will transfer bed<>w/c with max assist +1. PT Short Term Goal 2 (Week 1): Pt will complete bed mobility with max assist +1. PT Short Term Goal 3 (Week 1): Pt will propel w/c 25 ft with min assist.   Skilled Therapeutic Interventions/Progress Updates:  Session 1   Pt received supine in bed and agreeable to PT. Supine>sit transfer with max assis and max cues/ facilitation for improved RLE and UE movement to improve safety.    SB transfer to [facility] with max assist and max multimodal cues for sequencing, proper UE and LE positioning, and improved anterior weight shift to improve safety with transfer.   Personal care to perform oral hygiene at sink with the LUE at sink with mod assist for grip on the R to stabilize toothpaste.    Sit<>stand in parallel bars x 2 with total-max assist from PT to push into partial standing; unable to come to full standing due to poor trunk and LUE control. Reciprocal scoot for improved positioning in WC with mod assist and moderate cues for trunk control and posture.   Patient returned to room and left sitting in [facility] with call bell in reach and all needs met.     Session 2.   Pt received sitting in WC and agreeable to PT. Pt reports extreme fatigue but willing to perform chair level PT  seated BLE therex/NMR. LAQ 2 x 10, hip abduction 2 x 10, hip extension x 8, AAROM hip flexion x 8. Cues for posture, full ROM, increased hold at end range.  Pt noted to have pitting edema in BLE R>L.   Reciprocal scooting x2 through session for improved positioning and safety in WC. Min cues for lateral weight shift and increased trunk activation on the R side.   Patient left  sitting in [facility] with call bell in reach and all needs met.        Therapy Documentation Precautions:  Precautions Precautions: Fall Precaution Comments: R sided weakness and R hip/thigh pain Restrictions Weight Bearing Restrictions: No    Vital Signs: Therapy Vitals Temp: 98 F (36.7 C) Pulse Rate: (!) 58 Resp: 17 BP: 123/79 Patient Position (if appropriate): Sitting Oxygen Therapy SpO2: 100 % O2 Device: Room Air Pain: Pain Assessment Pain Scale: 0-10 Pain Score: 0-No pain   Therapy/Group: Individual Therapy  Lorie Phenix 02/01/2019, 3:36 PM

## 2019-02-01 NOTE — Progress Notes (Signed)
Occupational Therapy Session Note  Patient Details  Name: Joel Rogers MRN: [id number] Date of Birth: 24-Jun-1951  Today's Date: 02/01/2019 OT Individual Time: 1000-1100 OT Individual Time Calculation (min): 60 min    Short Term Goals: Week 1:  OT Short Term Goal 1 (Week 1): Pt will complete sit > stand with max assist +2 to decrease burden of care for LB bathing/dressing OT Short Term Goal 2 (Week 1): Pt will complete UB dressing with mod assist OT Short Term Goal 3 (Week 1): Pt will complete toilet transfer with max assist +2 OT Short Term Goal 4 (Week 1): Pt will complete bathing with max assist   Skilled Therapeutic Interventions/Progress Updates:    Treatment session with focus on functional mobility and anterior weight shift as needed for transfers, sit > stand, and LB bathing/dressing tasks.  Pt received upright in w/c declining bathing/dressing but willing to focus on mobility.  Completed slide board transfer to Rt with total assist +2.  Manual facilitation for anterior weight shift and cues for initiation of movement.  Engaged in sit > stand x4 from edge of mat with max faded to mod assist +2 with pt demonstrating increased anterior weight shift and initiation of movement.  Utilized Ship broker for visual feedback to facilitate more upright posture.  Therapist applied kinesiotape to Rt hand and forearm to decrease swelling in hand and forearm.  Transferred back to w/c with slide board +2.  Pt demonstrating increased initiation and weight shifting, however still requiring max-total assist of +2.  Therapist positioned in front of pt in arm chair to facilitate increased weight shift.  Applied heat to Rt shoulder and around neck to decrease pain and stiffness.  Pt encouraged to recall to remove in 20 mins - SLP present to remind pt.    Therapy Documentation Precautions:  Precautions Precautions: Fall Precaution Comments: R sided weakness and R hip/thigh pain Restrictions Weight Bearing  Restrictions: No Pain:  Pt reports pain in neck/Rt shoulder.  Applied heat and removed by SLP with pt reporting decrease in pain.   Therapy/Group: Individual Therapy  Rosalio Loud 02/01/2019, 12:08 PM

## 2019-02-01 NOTE — Progress Notes (Signed)
Speech Language Pathology Daily Session Note  Patient Details  Name: Joel Rogers MRN: [id number] Date of Birth: 1951-06-29  Today's Date: 02/01/2019 SLP Individual Time: 1100-1200 SLP Individual Time Calculation (min): 60 min  Short Term Goals: Week 1: SLP Short Term Goal 1 (Week 1): Pt will utilize compensatory and external memory aids to recall, new daily information with min A verbal cues. SLP Short Term Goal 2 (Week 1): Pt will complete semi-complex tasks with supervision A verbal cues for functional problem solving.  SLP Short Term Goal 3 (Week 1): Pt will self-monitor and self-correct functional errors in problem solving tasks with supervision A verbal cues.   Skilled Therapeutic Interventions:  Skilled treatment session focused on cognition goals. SLP facilitated session by providing Max A multimodal cues to complete basic money management tasks. Pt with no awareness of deficits and presents with pleasant excuses for inability. Pt is oriented x 4 but has difficulty recalling events from the day as well as difficulty recalling time that heating pack needed to be removed. Wife present and endorses that pt PLOF was independent for the tasks targeted in session. Pt was Max A for self-monitoring and no attempt to self-correct during tasks. Pt was left upright in wheelchair, lap belt alarm on and all needs within reach. Continue per current plan of care.      Pain Pain Assessment Pain Scale: 0-10 Pain Score: 0-No pain  Therapy/Group: Individual Therapy  Santasia Rew 02/01/2019, 12:40 PM

## 2019-02-02 ENCOUNTER — Inpatient Hospital Stay (HOSPITAL_COMMUNITY): Payer: Self-pay | Admitting: Speech Pathology

## 2019-02-02 ENCOUNTER — Inpatient Hospital Stay (HOSPITAL_COMMUNITY): Payer: Self-pay | Admitting: Physical Therapy

## 2019-02-02 ENCOUNTER — Inpatient Hospital Stay (HOSPITAL_COMMUNITY): Payer: Self-pay | Admitting: Occupational Therapy

## 2019-02-02 MED ORDER — METHOCARBAMOL 500 MG PO TABS
500.0000 mg | ORAL_TABLET | Freq: Three times a day (TID) | ORAL | Status: DC | PRN
  Administered 2019-02-02: 500 mg via ORAL
  Filled 2019-02-02: qty 1

## 2019-02-02 NOTE — Progress Notes (Signed)
Speech Language Pathology Daily Session Note  Patient Details  Name: Joel Rogers MRN: [id number] Date of Birth: 1951-04-04  Today's Date: 02/02/2019 SLP Individual Time: 1030-1130 SLP Individual Time Calculation (min): 60 min  Short Term Goals: Week 1: SLP Short Term Goal 1 (Week 1): Pt will utilize compensatory and external memory aids to recall, new daily information with min A verbal cues. SLP Short Term Goal 2 (Week 1): Pt will complete semi-complex tasks with supervision A verbal cues for functional problem solving.  SLP Short Term Goal 3 (Week 1): Pt will self-monitor and self-correct functional errors in problem solving tasks with supervision A verbal cues.   Skilled Therapeutic Interventions:  Skilled treatment session focused on cognition goals. SLP facilitated session by providing Mod A cues to complete semi-complex medication management tasks, Mod A for emergent awareness, Mod A for attention to task in moderately distracting environment. Pt was returned to room, left upright in wheelchair, wife present and all needs within reach. Continue per current plan of care.      Pain Pain Assessment Pain Scale: 0-10 Pain Score: 0-No pain  Therapy/Group: Individual Therapy  Joel Rogers 02/02/2019, 12:30 PM

## 2019-02-02 NOTE — Progress Notes (Signed)
Occupational Therapy Session Note  Patient Details  Name: Joel Rogers MRN: [id number] Date of Birth: [birth year]  Today's Date: 02/02/2019 OT Individual Time: 1305-1400 OT Individual Time Calculation (min): 55 min    Short Term Goals: Week 1:  OT Short Term Goal 1 (Week 1): Pt will complete sit > stand with max assist +2 to decrease burden of care for LB bathing/dressing OT Short Term Goal 2 (Week 1): Pt will complete UB dressing with mod assist OT Short Term Goal 3 (Week 1): Pt will complete toilet transfer with max assist +2 OT Short Term Goal 4 (Week 1): Pt will complete bathing with max assist   Skilled Therapeutic Interventions/Progress Updates:    Treatment session with focus on functional transfers and sit > stand as needed for self-care tasks.  Pt received upright in w/c agreeable to therapy session.  Pt completed slide board transfer w/c > therapy mat with max assist +2.  Pt required max cues for sequencing and initiation of weight shift for transfers.  Engaged in sit > stand x2 with mod assist +2, pt demonstrating increased initiation and lift off but unable to stand fully upright.  Utilized bariatric RW for UE support during sit > stand x3.  Pt able to complete sit > stand with mod assist +2 and demonstrate increased upright standing posture with RW for UE support.  Pt with increased fatigue during transfer back to w/c with slide board, ultimately requiring +3 for transfer and 4th person to stabilize w/c during transfer.  Pt unable to initiate or weight shift to assist with transfer due to fatigue.  Pt expressing desire to remain upright in w/c.  Pt with all needs in reach and wife present in room.  Therapy Documentation Precautions:  Precautions Precautions: Fall Precaution Comments: R sided weakness and R hip/thigh pain Restrictions Weight Bearing Restrictions: No General:   Vital Signs: Therapy Vitals Temp: 98 F (36.7 C) Temp Source: Oral Pulse Rate: 63 Resp:  17 BP: 113/75 Patient Position (if appropriate): Sitting Oxygen Therapy SpO2: 100 % O2 Device: Room Air Pain: Pain Assessment Pain Scale: 0-10 Pain Score: 0-No pain   Therapy/Group: Individual Therapy  Rosalio Loud 02/02/2019, 4:07 PM

## 2019-02-02 NOTE — Progress Notes (Signed)
Pt refusing CPAP stating he has not worn in years. I told him to call if he changes is mind.

## 2019-02-02 NOTE — IPOC Note (Signed)
Overall Plan of Care [facility]) Patient Details Name: Joel Rogers MRN: [id number] DOB: 10-Mar-1951  Admitting Diagnosis:Triplegia  post bilateral strokes Hospital Problems: Active Problems:   Acute bilateral cerebral infarction in a watershed distribution [facility])     Functional Problem List: Nursing Bladder, Bowel, Motor, Pain, Safety, Skin Integrity  PT Balance, Skin Integrity, Pain, Behavior, Edema, Endurance, Safety, Motor, Sensory  OT Balance, Cognition, Endurance, Motor, Pain, Perception, Safety, Skin Integrity  SLP    TR         Basic ADL's: OT Grooming, Bathing, Dressing, Toileting     Advanced  ADL's: OT       Transfers: PT Bed Mobility, Bed to Chair, Car, Occupational psychologist, Research scientist (life sciences): PT Ambulation, Stairs, Psychologist, prison and probation services     Additional Impairments: OT Fuctional Use of Upper Extremity  SLP Social Cognition   Problem Solving, Memory, Awareness  TR      Anticipated Outcomes Item Anticipated Outcome  Self Feeding    Swallowing      Basic self-care  Min assist  Toileting  Min assist    Bathroom Transfers Min assist  Bowel/Bladder  Continent to bowel and bladder with max. assist.  Transfers  min assist with LRAD  Locomotion  supervision w/c level  Communication     Cognition  Mod I- Supervision A  Pain  Less than 3,on 1 to 10 score.  Safety/Judgment  Free from falls during his stay in rehab.   Therapy Plan: PT Intensity: Minimum of 1-2 x/day ,45 to 90 minutes PT Frequency: 5 out of 7 days PT Duration Estimated Length of Stay: 3-4 weeks OT Intensity: Minimum of 1-2 x/day, 45 to 90 minutes OT Frequency: 5 out of 7 days OT Duration/Estimated Length of Stay: 21-25 days SLP Intensity: Minumum of 1-2 x/day, 30 to 90 minutes SLP Frequency: 3 to 5 out of 7 days SLP Duration/Estimated Length of Stay: 12-16 days     Team Interventions: Nursing Interventions Patient/Family Education, Pain Management, Bladder Management,  Discharge Planning, Bowel Management, Skin Care/Wound Management, Disease Management/Prevention  PT interventions Ambulation/gait training, Cognitive remediation/compensation, Discharge planning, DME/adaptive equipment instruction, Functional mobility training, Pain management, Psychosocial support, Splinting/orthotics, Therapeutic Activities, UE/LE Strength taining/ROM, Wheelchair propulsion/positioning, UE/LE Coordination activities, Therapeutic Exercise, Stair training, Skin care/wound management, Patient/family education, Neuromuscular re-education, Functional electrical stimulation, Disease management/prevention, Warden/ranger, Community reintegration  OT Interventions Warden/ranger, Cognitive remediation/compensation, Discharge planning, Disease mangement/prevention, DME/adaptive equipment instruction, Functional electrical stimulation, Functional mobility training, Neuromuscular re-education, Pain management, Patient/family education, Psychosocial support, Self Care/advanced ADL retraining, Skin care/wound managment, Splinting/orthotics, Therapeutic Activities, Therapeutic Exercise, UE/LE Strength taining/ROM, UE/LE Coordination activities  SLP Interventions Cueing hierarchy, Functional tasks, Cognitive remediation/compensation, Medication managment, Patient/family education, Internal/external aids  TR Interventions    SW/CM Interventions Discharge Planning, Psychosocial Support, Patient/Family Education   Barriers to Discharge MD  Medical stability  Nursing      PT Inaccessible home environment, Decreased caregiver support, Home environment access/layout steps to enter house, unsure if pt's wife can provide physical 24 hr assist  OT Home environment access/layout    SLP      SW       Team Discharge Planning: Destination: PT-Home ,OT- Home , SLP-Home Projected Follow-up: PT-Home health PT, 24 hour supervision/assistance, OT-  Home health OT, 24 hour  supervision/assistance, SLP-24 hour supervision/assistance, Home Health SLP Projected Equipment Needs: PT-To be determined, OT- Tub/shower bench(wide drop arm BSC), SLP-None recommended by SLP Equipment Details: PT- , OT-  Patient/family involved in discharge  planning: PT- Patient, Family member/caregiver,  OT-Patient, SLP-Patient, Family member/caregiver  MD ELOS: 12-16d Medical Rehab Prognosis:  Good Assessment:  68 year old right-handed male with history of diastolic congestive heart failure, aortic stenosis status post TAVR September 2019, atrial fibrillation maintained on Eliquis,OSA-CPAP, hypertension. Per chart review lives with spouse. Community ambulator active and driving. One level home with 3 steps to entry.Presented [facility] hospital 01/13/2019 secondary to right and left upper extremity pain with recent fall and right side weakness and findings of sepsis due to Staphylococcus bacteremia and cellulitis.during admission maintain on antibiotic therapy. Noted persistent right side weakness. MRI of the brain revealed multiple acute ischemic infarctions in a pattern most consistent with cardioembolic stroke. Patient was transferred to [facility] for further evaluation. Venous Doppler studies showed no signs of DVT. Findings of elevated liver function studies ultrasound the abdomen showed no focal lesion identified. Follow-up MRI of the brain with numerous scattered foci of acute ischemia throughout the cerebrum and cerebellum. Many of these lesions suggestive of embolic pattern. No midline shift or mass effect. No proximal intracranial arterial occlusion. Echocardiogram with ejection fraction of 60% normal systolic function. TEE completed showing no vegetation on bioprosthetic aortic valve. Patient remained on eliquis for atrial fibrillation with the addition of aspirin for CVA prophylaxis. Patient developed coffee-ground emesis with acute blood loss anemia requiring transfusions totaling 6  units units pack red blood cells and gastroenterology consulted EGD completed 2 with findings of red blood in the lower third of the esophagus clotted blood in the entire stomach. Blood thinners were placed on hold patient remained NPO with follow-up upper GI endoscopy again completed 01/26/2019 Dr Matthias Hughs showing no active bleeding or blood present and appeared to show some resolving ischemic changes in the region of the gastric pouch and the anastomosis. His diet was slowly advanced. Patient's aspirin and Eliquis currently remains on hold and await plan to resume and possibly 3-5 days. It was recommended to continue Protonix twice daily. Hospital course AKI with creatinine baseline 1.1-1.23 elevated to 1.95 with renal services consulted 01/23/2019. AKI likely secondary to ischemic ATN in the setting of sepsis. Maintain on gentle IV fluids and creatinine improved 1.00. Patient is completing a course of Ancef for sepsis  Resume anticoagulation while on rehab and monitor for GI bleed   Now requiring 24/7 Rehab RN,MD, as well as CIR level PT, OT and SLP.  Treatment team will focus on ADLs and mobility with goals set at minA/Sup  See Team Conference Notes for weekly updates to the plan of care

## 2019-02-02 NOTE — Progress Notes (Signed)
[place] PHYSICAL MEDICINE & REHABILITATION PROGRESS NOTE   Subjective/Complaints:  No issues overnite , pt denies pain, asks about his chance for improvement States he had "brain surgery a couple years ago to remove blood form the brain".  Denies head trauma, instead states it was due to Eliquis We discussed resuming Eliquis on Monday and monitoring Hgb for several days after that  ROS- denies CP, SOB, N/V/D, has good sensation for bladder  Objective:   No results found. Recent Labs    01/30/19 0830 01/31/19 0612  WBC 16.7* 14.8*  HGB 9.3* 8.8*  HCT 29.1* 27.2*  PLT 121* 130*   Recent Labs    01/30/19 0830 01/31/19 0612  NA 135 137  K 3.4* 3.5  CL 108 108  CO2 24 26  GLUCOSE 130* 106*  BUN 10 11  CREATININE 1.01 0.93  CALCIUM 8.1* 8.2*    Intake/Output Summary (Last 24 hours) at 02/02/2019 4696 Last data filed at 02/02/2019 0537 Gross per 24 hour  Intake 684 ml  Output 775 ml  Net -91 ml     Physical Exam: Vital Signs Blood pressure 134/78, pulse 73, temperature 97.6 F (36.4 C), temperature source Oral, resp. rate 18, height 6\' 1"  (1.854 m), weight 123.4 kg, SpO2 100 %.   General: No acute distress Mood and affect are appropriate Heart: IRRR  no rubs murmurs or extra sounds Lungs: Clear to auscultation, breathing unlabored, no rales or wheezes Abdomen: Positive bowel sounds, soft nontender to palpation, nondistended Extremities: No clubbing, cyanosis, or edema Skin: No evidence of breakdown, no evidence of rash Neurologic: Cranial nerves II through XII intact, motor strength is 5/5 in left deltoid, bicep, tricep, grip,2- bilateral hip flexor, knee extensors, ankle dorsiflexor and plantar flexor 2- R delt, 4/5 R Biceps, tricep, grip Cerebellar exam normal finger to nose to finger LUE, cannot assess in LE and RUE d/t weakness Ext 2+ edema with stasis dermatitis changes Musculoskeletal: Full range of motion in all 4 extremities. No joint  swelling   Assessment/Plan: 1. Functional deficits secondary to Right hemiparesis, and LLE monoparesis which require 3+ hours per day of interdisciplinary therapy in a comprehensive inpatient rehab setting.  Physiatrist is providing close team supervision and 24 hour management of active medical problems listed below.  Physiatrist and rehab team continue to assess barriers to discharge/monitor patient progress toward functional and medical goals  Care Tool:  Bathing  Bathing activity did not occur: Safety/medical concerns Body parts bathed by patient: Left arm, Chest, Abdomen         Bathing assist Assist Level: (+2 at bed level)     Upper Body Dressing/Undressing Upper body dressing   What is the patient wearing?: Pull over shirt    Upper body assist Assist Level: Total Assistance - Patient < 25%    Lower Body Dressing/Undressing Lower body dressing      What is the patient wearing?: Underwear/pull up     Lower body assist Assist for lower body dressing: Total Assistance - Patient < 25%     Toileting Toileting    Toileting assist Assist for toileting: Independent with assistive device Assistive Device Comment: urinal   Transfers Chair/bed transfer  Transfers assist  Chair/bed transfer activity did not occur: Safety/medical concerns  Chair/bed transfer assist level: Dependent - mechanical lift     Locomotion Ambulation   Ambulation assist   Ambulation activity did not occur: Safety/medical concerns          Walk 10 feet activity  Assist  Walk 10 feet activity did not occur: Safety/medical concerns        Walk 50 feet activity   Assist Walk 50 feet with 2 turns activity did not occur: Safety/medical concerns         Walk 150 feet activity   Assist Walk 150 feet activity did not occur: Safety/medical concerns         Walk 10 feet on uneven surface  activity   Assist Walk 10 feet on uneven surfaces activity did not occur:  Safety/medical concerns         Wheelchair     Assist     Wheelchair activity did not occur: Refused         Wheelchair 50 feet with 2 turns activity    Assist    Wheelchair 50 feet with 2 turns activity did not occur: Refused       Wheelchair 150 feet activity     Assist Wheelchair 150 feet activity did not occur: Refused        Medical Problem List and Plan: 1.Persistent right side weakness with decreased functional mobilitysecondary tocardioembolicbi-cerebral infarctions CIR PT, OT- still +2 max to stand weakn ess in both LE but pt denies residual from R SDH, Neurosurgery D/C summ indicated onset of LLE as presenting symptom. Also hx at the time of H and P, 08/27/2016 "he hit his head on a dresser and had bruising on the right side of his scalp about a month ago." 2. Antithrombotics: -DVT/anticoagulation:SCDs.Dopplers negative for DVT -eliquis on hold due to GIB -antiplatelet therapy: ASA on hold d/t GIB Keep PICC until f/u CBC after Eliquis resumed, may need transfusion 3. Pain Management:Tylenol as needed 4. Mood:Provide emotional support -antipsychotic agents: None 5. Neuropsych: This patientiscapable of making decisions on hisown behalf. 6. Skin/Wound Care:Routine skin checks 7. Fluids/Electrolytes/Nutrition:Routine in and out's with follow-up chemistries 8. Right lower extremity cellulitis with sepsis/Streptococcus bacteremia. Intravenous Ancef completed 01/30/2019 also with chronic stasis dermatitis, add Ted hose, extensive PT visits for Lymphedema 2018 9. Upper GI bleed. EGD 3. Follow-up gastroenterology services. Continue PPI.Resume anticoag on Monday per Neuro10. Acute blood loss anemia. Patient has been transfused to do 6 units pack red blood cells. Follow-up CBC. 11. Chronic atrial fibrillation as well as history of TAVR procedure.Diltiazem 120 mg  daily.Cardiac rate controlled. 3/13 Vitals:   02/01/19 1948 02/02/19 0313  BP: 130/88 134/78  Pulse: (!) 59 73  Resp: 17 18  Temp: 98 F (36.7 C) 97.6 F (36.4 C)  SpO2: 100% 100%   12. Hypertension.Aldactone 12.5 mg daily. Monitor with increased mobility Controlled 3/13 13. Hyperlipidemia. Lipitor 14. OSA. CPAP 15. Gout left great toe: prednisone 10mg  bid x 4 doses  16.  Cognitive deficits due to CVA and hx of TBI  LOS: 3 days A FACE TO FACE EVALUATION WAS PERFORMED  Erick Colace 02/02/2019, 8:08 AM

## 2019-02-02 NOTE — Progress Notes (Signed)
Physical Therapy Session Note  Patient Details  Name: Joel Rogers MRN: [id number] Date of Birth: 12-Aug-1951  Today's Date: 02/02/2019 PT Individual Time: 4650-3546 PT Individual Time Calculation (min): 72 min   Short Term Goals: Week 1:  PT Short Term Goal 1 (Week 1): Pt will transfer bed<>w/c with max assist +1. PT Short Term Goal 2 (Week 1): Pt will complete bed mobility with max assist +1. PT Short Term Goal 3 (Week 1): Pt will propel w/c 25 ft with min assist.   Skilled Therapeutic Interventions/Progress Updates:  Pt received in bed with RN (Nate) assisting pt with donning clean brief after toileting. Therapist also assists with pt with rolling L<>R with +2 assist with max cuing & assist for extremity placement with pt limited by significant RLE pain with pt reporting it's cramps & PA (Pam) made aware. Pt requires dependent assist for donning clean brief and shorts from bed level. Pt transfers sit>supine with +2 assist with extra time to transfer BLE to EOB and pt attempting to direct care and sequencing of task.  Pt transfers slightly downhill bed>w/c with slide board +2 assist with dependent assist for board placement, max assist to maintain balance during R lateral lean. Therapist provides cuing for head/hips relationship with pt demonstrating improved ability to scoot buttocks across board. Pt brushes teeth at sink from w/c level set up assist. Therapist wrapped BLE with ACE wraps to thighs, per PA (Pam's) recommendations 2/2 BLE edema. Pt propels w/c with L hemi technique with max assist for steering 2/2 decreased ability to do so with LLE with max distance of ~25 ft at a time. Pt left in handoff to SLP.  Therapy Documentation Precautions:  Precautions Precautions: Fall Precaution Comments: R sided weakness and R hip/thigh pain Restrictions Weight Bearing Restrictions: No     Therapy/Group: Individual Therapy  Sandi Mariscal 02/02/2019, 12:38 PM

## 2019-02-03 ENCOUNTER — Inpatient Hospital Stay (HOSPITAL_COMMUNITY): Payer: Self-pay | Admitting: Speech Pathology

## 2019-02-03 ENCOUNTER — Other Ambulatory Visit: Payer: Self-pay

## 2019-02-03 ENCOUNTER — Inpatient Hospital Stay (HOSPITAL_COMMUNITY)
Admission: AD | Admit: 2019-02-03 | Discharge: 2019-02-12 | DRG: 280 | Disposition: A | Discharge status: Rehabilitation Facility | Payer: Medicare Other | Source: Ambulatory Visit | Attending: Cardiovascular Disease | Admitting: Cardiovascular Disease

## 2019-02-03 ENCOUNTER — Encounter (HOSPITAL_COMMUNITY): Payer: Self-pay

## 2019-02-03 ENCOUNTER — Inpatient Hospital Stay (HOSPITAL_COMMUNITY): Payer: Self-pay | Admitting: Physical Therapy

## 2019-02-03 ENCOUNTER — Inpatient Hospital Stay (HOSPITAL_COMMUNITY): Payer: Medicare Other

## 2019-02-03 ENCOUNTER — Inpatient Hospital Stay (HOSPITAL_COMMUNITY): Payer: Self-pay

## 2019-02-03 DIAGNOSIS — K284 Chronic or unspecified gastrojejunal ulcer with hemorrhage: Secondary | ICD-10-CM | POA: Diagnosis not present

## 2019-02-03 DIAGNOSIS — I872 Venous insufficiency (chronic) (peripheral): Secondary | ICD-10-CM | POA: Diagnosis present

## 2019-02-03 DIAGNOSIS — E440 Moderate protein-calorie malnutrition: Secondary | ICD-10-CM | POA: Diagnosis present

## 2019-02-03 DIAGNOSIS — I469 Cardiac arrest, cause unspecified: Secondary | ICD-10-CM | POA: Diagnosis not present

## 2019-02-03 DIAGNOSIS — I313 Pericardial effusion (noninflammatory): Secondary | ICD-10-CM | POA: Diagnosis present

## 2019-02-03 DIAGNOSIS — I69351 Hemiplegia and hemiparesis following cerebral infarction affecting right dominant side: Secondary | ICD-10-CM

## 2019-02-03 DIAGNOSIS — E785 Hyperlipidemia, unspecified: Secondary | ICD-10-CM | POA: Diagnosis not present

## 2019-02-03 DIAGNOSIS — G931 Anoxic brain damage, not elsewhere classified: Secondary | ICD-10-CM | POA: Diagnosis not present

## 2019-02-03 DIAGNOSIS — I5033 Acute on chronic diastolic (congestive) heart failure: Secondary | ICD-10-CM | POA: Diagnosis present

## 2019-02-03 DIAGNOSIS — Z820 Family history of epilepsy and other diseases of the nervous system: Secondary | ICD-10-CM

## 2019-02-03 DIAGNOSIS — G4733 Obstructive sleep apnea (adult) (pediatric): Secondary | ICD-10-CM | POA: Diagnosis present

## 2019-02-03 DIAGNOSIS — Z803 Family history of malignant neoplasm of breast: Secondary | ICD-10-CM | POA: Diagnosis not present

## 2019-02-03 DIAGNOSIS — I4901 Ventricular fibrillation: Secondary | ICD-10-CM

## 2019-02-03 DIAGNOSIS — Z79899 Other long term (current) drug therapy: Secondary | ICD-10-CM

## 2019-02-03 DIAGNOSIS — Z8249 Family history of ischemic heart disease and other diseases of the circulatory system: Secondary | ICD-10-CM | POA: Diagnosis not present

## 2019-02-03 DIAGNOSIS — Z8674 Personal history of sudden cardiac arrest: Secondary | ICD-10-CM | POA: Diagnosis not present

## 2019-02-03 DIAGNOSIS — G4700 Insomnia, unspecified: Secondary | ICD-10-CM | POA: Diagnosis present

## 2019-02-03 DIAGNOSIS — I609 Nontraumatic subarachnoid hemorrhage, unspecified: Secondary | ICD-10-CM | POA: Diagnosis not present

## 2019-02-03 DIAGNOSIS — Z9884 Bariatric surgery status: Secondary | ICD-10-CM | POA: Diagnosis not present

## 2019-02-03 DIAGNOSIS — I4510 Unspecified right bundle-branch block: Secondary | ICD-10-CM | POA: Diagnosis present

## 2019-02-03 DIAGNOSIS — Z6834 Body mass index (BMI) 34.0-34.9, adult: Secondary | ICD-10-CM

## 2019-02-03 DIAGNOSIS — Z953 Presence of xenogenic heart valve: Secondary | ICD-10-CM

## 2019-02-03 DIAGNOSIS — I110 Hypertensive heart disease with heart failure: Secondary | ICD-10-CM | POA: Diagnosis present

## 2019-02-03 DIAGNOSIS — Z8719 Personal history of other diseases of the digestive system: Secondary | ICD-10-CM

## 2019-02-03 DIAGNOSIS — I8290 Acute embolism and thrombosis of unspecified vein: Secondary | ICD-10-CM | POA: Diagnosis not present

## 2019-02-03 DIAGNOSIS — Q211 Atrial septal defect: Secondary | ICD-10-CM | POA: Diagnosis not present

## 2019-02-03 DIAGNOSIS — Z4659 Encounter for fitting and adjustment of other gastrointestinal appliance and device: Secondary | ICD-10-CM

## 2019-02-03 DIAGNOSIS — Z888 Allergy status to other drugs, medicaments and biological substances status: Secondary | ICD-10-CM

## 2019-02-03 DIAGNOSIS — I083 Combined rheumatic disorders of mitral, aortic and tricuspid valves: Secondary | ICD-10-CM | POA: Diagnosis present

## 2019-02-03 DIAGNOSIS — R4701 Aphasia: Secondary | ICD-10-CM | POA: Diagnosis not present

## 2019-02-03 DIAGNOSIS — I214 Non-ST elevation (NSTEMI) myocardial infarction: Principal | ICD-10-CM | POA: Diagnosis present

## 2019-02-03 DIAGNOSIS — I639 Cerebral infarction, unspecified: Secondary | ICD-10-CM

## 2019-02-03 DIAGNOSIS — E876 Hypokalemia: Secondary | ICD-10-CM | POA: Diagnosis not present

## 2019-02-03 DIAGNOSIS — I272 Pulmonary hypertension, unspecified: Secondary | ICD-10-CM | POA: Diagnosis present

## 2019-02-03 DIAGNOSIS — M170 Bilateral primary osteoarthritis of knee: Secondary | ICD-10-CM | POA: Diagnosis present

## 2019-02-03 DIAGNOSIS — K922 Gastrointestinal hemorrhage, unspecified: Secondary | ICD-10-CM | POA: Diagnosis not present

## 2019-02-03 DIAGNOSIS — I5031 Acute diastolic (congestive) heart failure: Secondary | ICD-10-CM | POA: Diagnosis not present

## 2019-02-03 DIAGNOSIS — Z7901 Long term (current) use of anticoagulants: Secondary | ICD-10-CM

## 2019-02-03 DIAGNOSIS — G9340 Encephalopathy, unspecified: Secondary | ICD-10-CM | POA: Diagnosis not present

## 2019-02-03 DIAGNOSIS — I371 Nonrheumatic pulmonary valve insufficiency: Secondary | ICD-10-CM | POA: Diagnosis not present

## 2019-02-03 DIAGNOSIS — I6349 Cerebral infarction due to embolism of other cerebral artery: Secondary | ICD-10-CM | POA: Diagnosis not present

## 2019-02-03 DIAGNOSIS — R4020 Unspecified coma: Secondary | ICD-10-CM | POA: Diagnosis not present

## 2019-02-03 DIAGNOSIS — D62 Acute posthemorrhagic anemia: Secondary | ICD-10-CM | POA: Diagnosis not present

## 2019-02-03 DIAGNOSIS — E669 Obesity, unspecified: Secondary | ICD-10-CM | POA: Diagnosis present

## 2019-02-03 DIAGNOSIS — I462 Cardiac arrest due to underlying cardiac condition: Secondary | ICD-10-CM | POA: Diagnosis present

## 2019-02-03 DIAGNOSIS — Z4682 Encounter for fitting and adjustment of non-vascular catheter: Secondary | ICD-10-CM | POA: Diagnosis not present

## 2019-02-03 DIAGNOSIS — I472 Ventricular tachycardia, unspecified: Secondary | ICD-10-CM

## 2019-02-03 DIAGNOSIS — I6310 Cerebral infarction due to embolism of unspecified precerebral artery: Secondary | ICD-10-CM | POA: Diagnosis not present

## 2019-02-03 DIAGNOSIS — D500 Iron deficiency anemia secondary to blood loss (chronic): Secondary | ICD-10-CM | POA: Diagnosis not present

## 2019-02-03 DIAGNOSIS — Z9989 Dependence on other enabling machines and devices: Secondary | ICD-10-CM

## 2019-02-03 DIAGNOSIS — I482 Chronic atrial fibrillation, unspecified: Secondary | ICD-10-CM | POA: Diagnosis present

## 2019-02-03 DIAGNOSIS — Z96641 Presence of right artificial hip joint: Secondary | ICD-10-CM | POA: Diagnosis not present

## 2019-02-03 DIAGNOSIS — I6340 Cerebral infarction due to embolism of unspecified cerebral artery: Secondary | ICD-10-CM | POA: Diagnosis present

## 2019-02-03 DIAGNOSIS — Z87891 Personal history of nicotine dependence: Secondary | ICD-10-CM

## 2019-02-03 DIAGNOSIS — I4821 Permanent atrial fibrillation: Secondary | ICD-10-CM | POA: Diagnosis not present

## 2019-02-03 DIAGNOSIS — R1310 Dysphagia, unspecified: Secondary | ICD-10-CM | POA: Diagnosis not present

## 2019-02-03 DIAGNOSIS — Z7982 Long term (current) use of aspirin: Secondary | ICD-10-CM

## 2019-02-03 DIAGNOSIS — I249 Acute ischemic heart disease, unspecified: Secondary | ICD-10-CM | POA: Insufficient documentation

## 2019-02-03 DIAGNOSIS — R4182 Altered mental status, unspecified: Secondary | ICD-10-CM | POA: Diagnosis not present

## 2019-02-03 DIAGNOSIS — Z8619 Personal history of other infectious and parasitic diseases: Secondary | ICD-10-CM

## 2019-02-03 DIAGNOSIS — I361 Nonrheumatic tricuspid (valve) insufficiency: Secondary | ICD-10-CM | POA: Diagnosis not present

## 2019-02-03 DIAGNOSIS — L8990 Pressure ulcer of unspecified site, unspecified stage: Secondary | ICD-10-CM | POA: Diagnosis present

## 2019-02-03 DIAGNOSIS — I4891 Unspecified atrial fibrillation: Secondary | ICD-10-CM | POA: Diagnosis not present

## 2019-02-03 DIAGNOSIS — I5032 Chronic diastolic (congestive) heart failure: Secondary | ICD-10-CM | POA: Diagnosis not present

## 2019-02-03 DIAGNOSIS — I6389 Other cerebral infarction: Secondary | ICD-10-CM | POA: Diagnosis not present

## 2019-02-03 HISTORY — DX: Ventricular fibrillation: I49.01

## 2019-02-03 LAB — CBC
HCT: 28.7 % — ABNORMAL LOW (ref 39.0–52.0)
Hemoglobin: 9.3 g/dL — ABNORMAL LOW (ref 13.0–17.0)
MCH: 26.6 pg (ref 26.0–34.0)
MCHC: 32.4 g/dL (ref 30.0–36.0)
MCV: 82 fL (ref 80.0–100.0)
NRBC: 0 % (ref 0.0–0.2)
Platelets: 136 10*3/uL — ABNORMAL LOW (ref 150–400)
RBC: 3.5 MIL/uL — ABNORMAL LOW (ref 4.22–5.81)
RDW: 20.7 % — ABNORMAL HIGH (ref 11.5–15.5)
WBC: 26.5 10*3/uL — ABNORMAL HIGH (ref 4.0–10.5)

## 2019-02-03 LAB — COMPREHENSIVE METABOLIC PANEL
ALK PHOS: 98 U/L (ref 38–126)
ALT: 39 U/L (ref 0–44)
AST: 120 U/L — ABNORMAL HIGH (ref 15–41)
Albumin: 2 g/dL — ABNORMAL LOW (ref 3.5–5.0)
Anion gap: 7 (ref 5–15)
BUN: 11 mg/dL (ref 8–23)
CO2: 20 mmol/L — ABNORMAL LOW (ref 22–32)
Calcium: 8.2 mg/dL — ABNORMAL LOW (ref 8.9–10.3)
Chloride: 109 mmol/L (ref 98–111)
Creatinine, Ser: 0.96 mg/dL (ref 0.61–1.24)
GFR calc Af Amer: >60 mL/min (ref >60)
GFR calc non Af Amer: >60 mL/min (ref >60)
GLUCOSE: 140 mg/dL — AB (ref 70–99)
Potassium: 3 mmol/L — ABNORMAL LOW (ref 3.5–5.1)
Sodium: 136 mmol/L (ref 135–145)
Total Bilirubin: 1.3 mg/dL — ABNORMAL HIGH (ref 0.3–1.2)
Total Protein: 6.1 g/dL — ABNORMAL LOW (ref 6.5–8.1)

## 2019-02-03 LAB — BRAIN NATRIURETIC PEPTIDE: B NATRIURETIC PEPTIDE 5: 408.1 pg/mL — AB (ref 0.0–100.0)

## 2019-02-03 LAB — CK TOTAL AND CKMB (NOT AT ARMC)
CK, MB: 2.8 ng/mL (ref 0.5–5.0)
Relative Index: 2.1 (ref 0.0–2.5)
Total CK: 131 U/L (ref 49–397)

## 2019-02-03 LAB — BASIC METABOLIC PANEL
Anion gap: 5 (ref 5–15)
BUN: 13 mg/dL (ref 8–23)
CO2: 22 mmol/L (ref 22–32)
Calcium: 8.1 mg/dL — ABNORMAL LOW (ref 8.9–10.3)
Chloride: 109 mmol/L (ref 98–111)
Creatinine, Ser: 1.1 mg/dL (ref 0.61–1.24)
GFR calc Af Amer: >60 mL/min (ref >60)
GFR calc non Af Amer: >60 mL/min (ref >60)
Glucose, Bld: 108 mg/dL — ABNORMAL HIGH (ref 70–99)
POTASSIUM: 3.6 mmol/L (ref 3.5–5.1)
Sodium: 136 mmol/L (ref 135–145)

## 2019-02-03 LAB — APTT: APTT: 37 s — AB (ref 24–36)

## 2019-02-03 LAB — MAGNESIUM
Magnesium: 1.7 mg/dL (ref 1.7–2.4)
Magnesium: 1.9 mg/dL (ref 1.7–2.4)

## 2019-02-03 LAB — HEPARIN LEVEL (UNFRACTIONATED): Heparin Unfractionated: 0.48 IU/mL (ref 0.30–0.70)

## 2019-02-03 LAB — LACTIC ACID, PLASMA
Lactic Acid, Venous: 2.2 mmol/L (ref 0.5–1.9)
Lactic Acid, Venous: 1 mmol/L (ref 0.5–1.9)

## 2019-02-03 LAB — PROTIME-INR
INR: 1.4 — ABNORMAL HIGH (ref 0.8–1.2)
Prothrombin Time: 16.7 seconds — ABNORMAL HIGH (ref 11.4–15.2)

## 2019-02-03 LAB — TROPONIN I
Troponin I: 0.2 ng/mL (ref <0.03)
Troponin I: 12.24 ng/mL (ref <0.03)

## 2019-02-03 MED ORDER — ACETAMINOPHEN 325 MG PO TABS
650.0000 mg | ORAL_TABLET | ORAL | Status: DC | PRN
  Administered 2019-02-04: 650 mg via ORAL
  Filled 2019-02-03: qty 2

## 2019-02-03 MED ORDER — NITROGLYCERIN 0.4 MG SL SUBL: 0.4000 mg | SUBLINGUAL_TABLET | SUBLINGUAL | Status: DC | PRN

## 2019-02-03 MED ORDER — HEPARIN BOLUS VIA INFUSION: 1000.0000 [IU] | Freq: Once | INTRAVENOUS | Status: DC

## 2019-02-03 MED ORDER — AMIODARONE HCL IN DEXTROSE 360-4.14 MG/200ML-% IV SOLN
60.0000 mg/h | INTRAVENOUS | Status: AC
  Filled 2019-02-03: qty 200

## 2019-02-03 MED ORDER — METOPROLOL TARTRATE 50 MG PO TABS
50.0000 mg | ORAL_TABLET | Freq: Two times a day (BID) | ORAL | Status: DC
  Administered 2019-02-03 – 2019-02-09 (×13): 50 mg via ORAL
  Filled 2019-02-03 (×13): qty 1

## 2019-02-03 MED ORDER — HEPARIN BOLUS VIA INFUSION
4000.0000 [IU] | Freq: Once | INTRAVENOUS | Status: DC
  Filled 2019-02-03: qty 4000

## 2019-02-03 MED ORDER — POTASSIUM CHLORIDE CRYS ER 20 MEQ PO TBCR
40.0000 meq | EXTENDED_RELEASE_TABLET | ORAL | Status: AC
  Administered 2019-02-03 (×2): 40 meq via ORAL
  Filled 2019-02-03 (×2): qty 2

## 2019-02-03 MED ORDER — HEPARIN (PORCINE) 25000 UT/250ML-% IV SOLN
1400.0000 [IU]/h | INTRAVENOUS | Status: DC
  Administered 2019-02-03 – 2019-02-05 (×3): 1400 [IU]/h via INTRAVENOUS
  Filled 2019-02-03 (×3): qty 250

## 2019-02-03 MED ORDER — LIDOCAINE BOLUS VIA INFUSION
50.0000 mg | Freq: Once | INTRAVENOUS | Status: AC
  Administered 2019-02-03: 50 mg via INTRAVENOUS
  Filled 2019-02-03: qty 52

## 2019-02-03 MED ORDER — AMIODARONE IV BOLUS ONLY 150 MG/100ML: 150.0000 mg | Freq: Once | INTRAVENOUS | Status: DC

## 2019-02-03 MED ORDER — AMIODARONE LOAD VIA INFUSION: 150.0000 mg | Freq: Once | INTRAVENOUS | Status: DC | PRN

## 2019-02-03 MED ORDER — CHLORHEXIDINE GLUCONATE CLOTH 2 % EX PADS
6.0000 | MEDICATED_PAD | Freq: Every day | CUTANEOUS | Status: DC
  Administered 2019-02-04 – 2019-02-09 (×6): 6 via TOPICAL

## 2019-02-03 MED ORDER — ALUM & MAG HYDROXIDE-SIMETH 200-200-20 MG/5ML PO SUSP
30.0000 mL | Freq: Once | ORAL | Status: AC
  Administered 2019-02-03: 30 mL via ORAL
  Filled 2019-02-03: qty 30

## 2019-02-03 MED ORDER — ALUM & MAG HYDROXIDE-SIMETH 200-200-20 MG/5ML PO SUSP
30.0000 mL | ORAL | Status: DC | PRN
  Administered 2019-02-03: 30 mL via ORAL
  Filled 2019-02-03: qty 30

## 2019-02-03 MED ORDER — METOPROLOL TARTRATE 50 MG PO TABS: 50.0000 mg | ORAL_TABLET | Freq: Two times a day (BID) | ORAL | Status: DC

## 2019-02-03 MED ORDER — ASPIRIN EC 81 MG PO TBEC
81.0000 mg | DELAYED_RELEASE_TABLET | Freq: Every day | ORAL | Status: DC
  Administered 2019-02-04 – 2019-02-07 (×4): 81 mg via ORAL
  Filled 2019-02-03 (×4): qty 1

## 2019-02-03 MED ORDER — ASPIRIN 81 MG PO CHEW
324.0000 mg | CHEWABLE_TABLET | ORAL | Status: AC
  Administered 2019-02-03: 324 mg via ORAL
  Filled 2019-02-03: qty 4

## 2019-02-03 MED ORDER — AMIODARONE IV BOLUS ONLY 150 MG/100ML: 150.0000 mg | INTRAVENOUS | Status: DC | PRN

## 2019-02-03 MED ORDER — PANTOPRAZOLE SODIUM 40 MG IV SOLR
40.0000 mg | Freq: Two times a day (BID) | INTRAVENOUS | Status: DC
  Administered 2019-02-03 – 2019-02-05 (×6): 40 mg via INTRAVENOUS
  Filled 2019-02-03 (×6): qty 40

## 2019-02-03 MED ORDER — ASPIRIN 300 MG RE SUPP: 300.0000 mg | RECTAL | Status: AC

## 2019-02-03 MED ORDER — HEPARIN (PORCINE) 25000 UT/250ML-% IV SOLN: 14.0000 [IU]/kg/h | INTRAVENOUS | Status: DC

## 2019-02-03 MED ORDER — ONDANSETRON HCL 4 MG/2ML IJ SOLN: 4.0000 mg | Freq: Four times a day (QID) | INTRAMUSCULAR | Status: DC | PRN

## 2019-02-03 MED ORDER — AMIODARONE LOAD VIA INFUSION: 150.0000 mg | Freq: Once | INTRAVENOUS | Status: DC

## 2019-02-03 MED ORDER — AMIODARONE HCL IN DEXTROSE 360-4.14 MG/200ML-% IV SOLN: 30.0000 mg/h | INTRAVENOUS | Status: DC

## 2019-02-03 MED ORDER — ORAL CARE MOUTH RINSE: 15.0000 mL | Freq: Two times a day (BID) | OROMUCOSAL | Status: DC

## 2019-02-03 MED ORDER — LIDOCAINE IN D5W 4-5 MG/ML-% IV SOLN
2.0000 mg/min | INTRAVENOUS | Status: DC
  Administered 2019-02-03 – 2019-02-04 (×2): 2 mg/min via INTRAVENOUS
  Filled 2019-02-03 (×2): qty 500

## 2019-02-03 MED ORDER — MAGNESIUM SULFATE 2 GM/50ML IV SOLN
2.0000 g | Freq: Once | INTRAVENOUS | Status: AC
  Administered 2019-02-03: 2 g via INTRAVENOUS
  Filled 2019-02-03: qty 50

## 2019-02-03 MED ORDER — ORAL CARE MOUTH RINSE
15.0000 mL | Freq: Two times a day (BID) | OROMUCOSAL | Status: DC
  Administered 2019-02-03 – 2019-02-11 (×15): 15 mL via OROMUCOSAL

## 2019-02-03 MED ORDER — AMIODARONE LOAD VIA INFUSION
150.0000 mg | Freq: Once | INTRAVENOUS | Status: DC
  Filled 2019-02-03: qty 83.34

## 2019-02-03 NOTE — H&P (Addendum)
NAME:  Joel Rogers, MRN:  [id number], DOB:  1951-06-18, LOS: 0 ADMISSION DATE:  02/03/2019, CONSULTATION DATE:  01/05/2019 REFERRING MD:  Sharen Heck - CIR, CHIEF COMPLAINT:  Post cardiac arrest   HPI/course in hospital  68 year old man who sustained a VF cardiac arrest on CIR.  He complained of acid-like burning retrosternal CP with no relief with Maalox.  Lost consciousness/pulse - CPR with defibrillation x3 prior to ROSC. Patient awake and communicative post arrest.  Had been admitted to CIR following admission for right hemiparesis and staphylococcal bacteremia from cellulitis.  MRI consistent with multiple cardiobembolic strokes. No vegetation seen on TEE, TAVR valve intact.  Developed CG emesis and hemodynamically significant UGIB requiring 6 units of blood. Eliquis and ASA held.  EGD: ischemic changes in gastric pouch. Anticoagulation placed on hold.  Improved to point he was transferred to CIR on 3/10. Was due to restart ASA and Eliquis on 3/16 while on rehab.  Past Medical History   Past Medical History:  Diagnosis Date  . Anemia    low iron  . Arthritis    bilateral knees  . Atrial fibrillation, chronic   . Chronic diastolic CHF (congestive heart failure) (HCC) 06/15/2018  . History of subdural hematoma   . Hyperlipidemia   . Hypertension   . Morbid obesity (HCC)   . Normal coronary arteries    by cardiac catheterization performed 03/14/06  . Pre-diabetes    pt denies  . S/P TAVR (transcatheter aortic valve replacement)    a. 07/25/18: Edwards Sapien 3 THV (size 26 mm, model # 9600TFX, serial # [id number]) by Dr. Laneta Simmers and Dr. Clifton James  . Sleep apnea    on CPAP  . Venous insufficiency      Past Surgical History:  Procedure Laterality Date  . BIOPSY  01/26/2019   Procedure: BIOPSY;  Surgeon: Bernette Redbird, MD;  Location: [facility] ENDOSCOPY;  Service: Endoscopy;;  . CRANIOTOMY Right 08/27/2016   Procedure: CRANIOTOMY HEMATOMA EVACUATION SUBDURAL;  Surgeon: Maeola Harman, MD;   Location: [facility] OR;  Service: Neurosurgery;  Laterality: Right;  . ESOPHAGOGASTRODUODENOSCOPY N/A 01/21/2019   Procedure: ESOPHAGOGASTRODUODENOSCOPY (EGD);  Surgeon: Vida Rigger, MD;  Location: [facility] ENDOSCOPY;  Service: Endoscopy;  Laterality: N/A;  . ESOPHAGOGASTRODUODENOSCOPY (EGD) WITH PROPOFOL N/A 01/22/2019   Procedure: ESOPHAGOGASTRODUODENOSCOPY (EGD) WITH PROPOFOL;  Surgeon: Bernette Redbird, MD;  Location: [facility] ENDOSCOPY;  Service: Endoscopy;  Laterality: N/A;  . ESOPHAGOGASTRODUODENOSCOPY (EGD) WITH PROPOFOL N/A 01/26/2019   Procedure: ESOPHAGOGASTRODUODENOSCOPY (EGD) WITH PROPOFOL;  Surgeon: Bernette Redbird, MD;  Location: [facility] ENDOSCOPY;  Service: Endoscopy;  Laterality: N/A;  . GASTRIC BYPASS  09/2008  . JOINT REPLACEMENT  08/31/2006   right hip  . MULTIPLE EXTRACTIONS WITH ALVEOLOPLASTY N/A 07/13/2018   Procedure: Extraction of tooth #'s 3,8,10, 23-26, 30and 32 with alveoloplasty and gross debridement of remaining teeth;  Surgeon: Charlynne Pander, DDS;  Location: [facility] OR;  Service: Oral Surgery;  Laterality: N/A;  . RIGHT HEART CATH N/A 07/06/2018   Procedure: RIGHT HEART CATH;  Surgeon: Kathleene Hazel, MD;  Location: [facility] INVASIVE CV LAB;  Service: Cardiovascular;  Laterality: N/A;  . RIGHT/LEFT HEART CATH AND CORONARY ANGIOGRAPHY N/A 07/10/2018   Procedure: RIGHT/LEFT HEART CATH AND CORONARY ANGIOGRAPHY;  Surgeon: Dolores Patty, MD;  Location: [facility] INVASIVE CV LAB;  Service: Cardiovascular;  Laterality: N/A;  . TEE WITHOUT CARDIOVERSION N/A 07/25/2018   Procedure: TRANSESOPHAGEAL ECHOCARDIOGRAM (TEE);  Surgeon: Kathleene Hazel, MD;  Location: [facility] OR;  Service: Open Heart Surgery;  Laterality: N/A;  .  TEE WITHOUT CARDIOVERSION N/A 01/18/2019   Procedure: TRANSESOPHAGEAL ECHOCARDIOGRAM (TEE) WITH PROPOFOL;  Surgeon: Jonelle Sidle, MD;  Location: [facility] ORS;  Service: Cardiovascular;  Laterality: N/A;  . TOTAL HIP ARTHROPLASTY     right hip  . TRANSCATHETER AORTIC VALVE REPLACEMENT,  TRANSFEMORAL  07/25/2018  . TRANSCATHETER AORTIC VALVE REPLACEMENT, TRANSFEMORAL N/A 07/25/2018   Procedure: TRANSCATHETER AORTIC VALVE REPLACEMENT, TRANSFEMORAL;  Surgeon: Kathleene Hazel, MD;  Location: [facility] OR;  Service: Open Heart Surgery;  Laterality: N/A;     Review of Systems:   Review of Systems  Constitutional: Positive for diaphoresis.  HENT: Negative.   Respiratory: Negative for shortness of breath.   Cardiovascular: Positive for chest pain and leg swelling (chronic wears compression stockings). Negative for palpitations.  Gastrointestinal: Positive for heartburn. Negative for abdominal pain, blood in stool, melena, nausea and vomiting.  Genitourinary: Negative for dysuria.  Musculoskeletal: Negative.   Skin: Negative.   Neurological: Positive for weakness (improving right sided weakness from stroke.).  Endo/Heme/Allergies: Negative.    I performed a point-of-care echocardiogram: It demonstrated LVH with normal LV size and function.  Sapiens valve with normal function.  Mild MR mild TR.  Normal RV function.  Pseudo-normal diastolic function.  Normal filling pressure.  IVC is not dilated and demonstrates normal respiratory variation.  Social History   reports that he has quit smoking. He has never used smokeless tobacco. He reports that he does not drink alcohol or use drugs.   Family History   His family history includes Alzheimer's disease in his maternal grandfather and mother; Breast cancer in his sister; Cancer in his brother and father; Hypertension in his father.   Allergies Allergies  Allergen Reactions  . Potassium-Containing Compounds     Stomach bleeding  . Kayexalate [Polystyrene] Other (See Comments)    Recommended to avoid by GI due to ischemic gastropathy     Home Medications  Prior to Admission medications   Medication Sig Start Date End Date Taking? Authorizing Provider  acetaminophen (TYLENOL) 650 MG CR tablet Take 650 mg by mouth 2 (two) times  daily as needed for pain.    [provider]  amoxicillin (AMOXIL) 500 MG capsule Take 2,000 mg (4 tablets) one hour prior to dental visits. 09/12/18   Janetta Hora, PA-C  apixaban (ELIQUIS) 5 MG TABS tablet Take 1 tablet (5 mg total) by mouth 2 (two) times daily. 07/27/18   Janetta Hora, PA-C  aspirin 81 MG chewable tablet Chew 1 tablet (81 mg total) by mouth daily. 07/27/18   Janetta Hora, PA-C  atorvastatin (LIPITOR) 80 MG tablet TAKE 1 TABLET BY MOUTH ONCE DAILY WITH  BREAKFAST Patient taking differently: Take 80 mg by mouth daily.  09/13/18   Runell Gess, MD  colchicine 0.6 MG tablet TAKE 1 TABLET BY MOUTH TWICE DAILY Patient taking differently: Take 0.6 mg by mouth 2 (two) times daily.  09/19/18   Donita Brooks, MD  diltiazem (CARDIZEM CD) 120 MG 24 hr capsule Take 1 capsule (120 mg total) by mouth daily. 12/08/18   [place]te, Velna Hatchet, MD  ergocalciferol (VITAMIN D2) 1.25 MG (50000 UT) capsule Take 50,000 Units by mouth once a week.    [provider]  spironolactone (ALDACTONE) 25 MG tablet Take 0.5 tablets (12.5 mg total) by mouth daily. 07/16/18 07/16/19  Barrett, Joline Salt, PA-C  torsemide (DEMADEX) 20 MG tablet Take 40 mg by mouth as needed. Use as needed if weight is over 245 pounds.  [provider]     Interim history/subjective:  Awoke with chest pain this morning. First ever occurrence.  Retrosternal with no radiation associated diaphoresis. Currently pain-free.  Objective   Height 6\' 1"  (1.854 m), weight 120.2 kg.       No intake or output data in the 24 hours ending 02/03/19 0836 There were no vitals filed for this visit.  Examination: Physical Exam  Constitutional: He is oriented to person, place, and time.  obese  HENT:  Head: Normocephalic and atraumatic.  Eyes: Pupils are equal, round, and reactive to light.  Neck: No JVD present.  Cardiovascular: Normal rate, S1 normal, S2 normal and normal pulses. An  irregularly irregular rhythm present. PMI is displaced. Exam reveals no S3 and no S4.  Murmur heard.  Crescendo decrescendo systolic (aortic distribution) murmur is present with a grade of 3/6. Genitourinary:    Genitourinary Comments: Condom catheter   Musculoskeletal:        General: Edema (bilateral 3+ leg edema.) present.  Neurological: He is alert and oriented to person, place, and time.  Mild right hemiparesis.  Skin: Skin is warm and dry.     Ancillary tests (personally reviewed)  CBC: Recent Labs  Lab 01/28/19 0530 01/29/19 0503 01/30/19 0830 01/31/19 0612 02/03/19 0834  WBC 13.8* 14.4* 16.7* 14.8* 26.5*  NEUTROABS  --   --  14.0* 12.2*  --   HGB 9.0* 9.4* 9.3* 8.8* 9.3*  HCT 27.8* 29.1* 29.1* 27.2* 28.7*  MCV 83.2 82.7 81.5 81.7 82.0  PLT 136* 125* 121* 130* 136*    Basic Metabolic Panel: Recent Labs  Lab 01/28/19 0530 01/29/19 0503 01/30/19 0830 01/31/19 0612  NA 137 135 135 137  K 3.5 3.3* 3.4* 3.5  CL 112* 108 108 108  CO2 20* 20* 24 26  GLUCOSE 118* 113* 130* 106*  BUN 19 13 10 11   CREATININE 1.14 1.00 1.01 0.93  CALCIUM 7.8* 7.7* 8.1* 8.2*   GFR: Estimated Creatinine Clearance: 103.3 mL/min (by C-G formula based on SCr of 0.93 mg/dL). Recent Labs  Lab 01/28/19 0530 01/29/19 0503 01/30/19 0830 01/31/19 0612  WBC 13.8* 14.4* 16.7* 14.8*    Liver Function Tests: Recent Labs  Lab 01/31/19 0612  AST 24  ALT 6  ALKPHOS 79  BILITOT 1.0  PROT 5.6*  ALBUMIN 1.7*   No results for input(s): LIPASE, AMYLASE in the last 168 hours. No results for input(s): AMMONIA in the last 168 hours.  ABG    Component Value Date/Time   PHART 7.362 07/25/2018 0804   PCO2ART 45.6 07/25/2018 0804   PO2ART 333.0 (H) 07/25/2018 0804   HCO3 22.7 01/13/2019 1550   TCO2 25 07/25/2018 1018   ACIDBASEDEF 2.2 (H) 01/13/2019 1550   O2SAT 74.9 01/13/2019 1550     Coagulation Profile: No results for input(s): INR, PROTIME in the last 168 hours.  Cardiac  Enzymes: No results for input(s): CKTOTAL, CKMB, CKMBINDEX, TROPONINI in the last 168 hours.  HbA1C: Hemoglobin A1C  Date/Time Value Ref Range Status  01/22/2016 6.2  Final   Hgb A1c MFr Bld  Date/Time Value Ref Range Status  07/21/2018 10:20 AM 5.8 (H) 4.8 - 5.6 % Final    Comment:    (NOTE) Pre diabetes:          5.7%-6.4% Diabetes:              >6.4% Glycemic control for   <7.0% adults with diabetes   11/26/2016 02:06 PM 5.6 <5.7 % Final  Comment:      For the purpose of screening for the presence of diabetes:   <5.7%       Consistent with the absence of diabetes 5.7-6.4 %   Consistent with increased risk for diabetes (prediabetes) >=6.5 %     Consistent with diabetes   This assay result is consistent with a decreased risk of diabetes.   Currently, no consensus exists regarding use of hemoglobin A1c for diagnosis of diabetes in children.   According to American Diabetes Association (ADA) guidelines, hemoglobin A1c <7.0% represents optimal control in non-pregnant diabetic patients. Different metrics may apply to specific patient populations. Standards of Medical Care in Diabetes (ADA).       CBG: No results for input(s): GLUCAP in the last 168 hours.  EKG: atrial fibrillation with right bundle branch block. New ST depression V2 but ST segment improved in V3 (lead placement) - personally reviewed.      TTE 2/24: EF 65% with normal Edwards Sapiens valve.  RV moderately reduced. RA/LA enlarged.     Assessment & Plan:  Critically ill status post VF cardiac arrest. At high risk for recurrent arrest. Chest pain at rest high suspicious for ACS. Residual right weakness from CVA Recent UGIB  Plan:  Will treat as ACS for now - ASA/iv heparin Echocardiogram Cardiology consultation - Dr Caprice Red advised Cardiology to advise regarding anti-arrhythmic therapy, risk stratification   ICU with cardiac monitoring Will resume IV PPI to mitigate any bleeding on  resumption of anticoagulation.  Best practice:  Diet: NPO pending risk stratification decision. Pain/Anxiety/Delirium protocol (if indicated): prn only VAP protocol (if indicated): not applicable DVT prophylaxis: therapeutic IV heparin GI prophylaxis: IV PPI Urinary catheter: Condom catheter only. Glucose control: Phase 1 protocol Mobility: BR with WC priv. Code Status: Full Family Communication: wife updated at bedside. Disposition: ICU.  Critical care time: 40 min including chart data review, examination of patient, multidisciplinary rounds, and frequent assessment and modification of anti-ischemic and anticoagulant therapy and coordination of care with cardiology.     Lynnell Catalan, MD [facility] ICU Physician [facility]  Pager: [phone] Mobile: [phone] After hours: [phone].  02/03/2019, 8:36 AM      \

## 2019-02-03 NOTE — Progress Notes (Signed)
ANTICOAGULATION CONSULT NOTE  Pharmacy Consult for heparin Indication: chest pain/ACS and atrial fibrillation  Allergies  Allergen Reactions  . Kayexalate [Polystyrene] Other (See Comments)    Recommended to avoid by GI due to ischemic gastropathy    Patient Measurements: Height: 6\' 1"  (185.4 cm) Weight: 268 lb 15.4 oz (122 kg) IBW/kg (Calculated) : 79.9 Heparin Dosing Weight: 106kg  Vital Signs: Temp: 97.6 F (36.4 C) (03/14 2000) Temp Source: Oral (03/14 2000) BP: 118/80 (03/14 2105) Pulse Rate: 59 (03/14 2105)  Labs: Recent Labs    02/03/19 0740 02/03/19 0834 02/03/19 1416 02/03/19 2038  HGB  --  9.3*  --   --   HCT  --  28.7*  --   --   PLT  --  136*  --   --   APTT  --  37*  --   --   LABPROT  --  16.7*  --   --   INR  --  1.4*  --   --   HEPARINUNFRC  --   --   --  0.48  CREATININE  --  0.96 1.10  --   CKTOTAL 131  --   --   --   CKMB 2.8  --   --   --   TROPONINI  --  0.20* 12.24*  --     Estimated Creatinine Clearance: 87.9 mL/min (by C-G formula based on SCr of 1.1 mg/dL).   Medical History: Past Medical History:  Diagnosis Date  . Anemia    low iron  . Arthritis    bilateral knees  . Atrial fibrillation, chronic   . Chronic diastolic CHF (congestive heart failure) (HCC) 06/15/2018  . History of subdural hematoma   . Hyperlipidemia   . Hypertension   . Morbid obesity (HCC)   . Normal coronary arteries    by cardiac catheterization performed 03/14/06  . Pre-diabetes    pt denies  . S/P TAVR (transcatheter aortic valve replacement)    a. 07/25/18: Edwards Sapien 3 THV (size 26 mm, model # 9600TFX, serial # [id number]) by Dr. Laneta Simmers and Dr. Clifton James  . Sleep apnea    on CPAP  . Venous insufficiency     Medications:  Apixaban prior to admission  Assessment: 68 year old male originally admitted to [facility] with bacteremia and cellulitis. Found to have bilateral CVAs and small SAH. Also found blood and blood clot in stomach but unable to  anticoagulate. Patient was later transferred to CIR on 3/10. Patient was on apixaban prior to admission, g/t GIB this was never restarted during this admission.   This am patient suffered cardiac arrest requiring cpr/defibrillation. Patient is now awake, alert on NRB. New orders to start IV heparin for r/o ACS and afib. Will not bolus given his recent GIB and SAH.  -initial heparin level= 0.48  Goal of Therapy:  Heparin level 0.3-0.5 units/ml Monitor platelets by anticoagulation protocol: Yes   Plan:  -no heparin changes needed -Daily heparin level and CBC  Harland German, PharmD Clinical Pharmacist **Pharmacist phone directory can now be found on [email or web address] ([email or web address]).  Listed under [facility].

## 2019-02-03 NOTE — Progress Notes (Signed)
[facility] PHYSICAL MEDICINE & REHABILITATION PROGRESS NOTE   Subjective/Complaints:  Pt stated he had burning chest pain which felt like "acid". Denies shortness of breath or chest pain. Maalox given without relief  ROS: Patient denies fever, rash, sore throat, blurred vision, nausea, vomiting, diarrhea, cough, shortness of breath or chest pain, joint or back pain, headache, or mood change.    Objective:   No results found. No results for input(s): WBC, HGB, HCT, PLT in the last 72 hours. No results for input(s): NA, K, CL, CO2, GLUCOSE, BUN, CREATININE, CALCIUM in the last 72 hours.  Intake/Output Summary (Last 24 hours) at 02/03/2019 0813 Last data filed at 02/03/2019 0656 Gross per 24 hour  Intake 840 ml  Output 1275 ml  Net -435 ml     Physical Exam: Vital Signs Blood pressure 134/86, pulse 88, temperature 98.1 F (36.7 C), temperature source Oral, resp. rate (!) 24, height 6\' 1"  (1.854 m), weight 120.5 kg, SpO2 100 %.  At 0700: Constitutional: No distress . Vital signs reviewed.  HEENT: EOMI, oral membranes moist Neck: supple Cardiovascular: IRR IRR with murmur. No JVD    Respiratory: CTA Bilaterally without wheezes or rales. Normal effort    GI: BS +, non-tender, non-distended  Extremities: No clubbing, cyanosis, or edema Skin: No evidence of breakdown, no evidence of rash Neurologic: Cranial nerves II through XII intact, motor strength is 5/5 in left deltoid, bicep, tricep, grip,2- bilateral hip flexor, knee extensors, ankle dorsiflexor and plantar flexor 2- R delt, 4/5 R Biceps, tricep, grip Cerebellar exam normal finger to nose to finger LUE, cannot assess in LE and RUE d/t weakness Ext 2+ edema with stasis dermatitis changes Musculoskeletal: Full range of motion in all 4 extremities. No joint swelling   Assessment/Plan: 1. Functional deficits secondary to Right hemiparesis, and LLE monoparesis which require 3+ hours per day of interdisciplinary therapy in a  comprehensive inpatient rehab setting.  Physiatrist is providing close team supervision and 24 hour management of active medical problems listed below.  Physiatrist and rehab team continue to assess barriers to discharge/monitor patient progress toward functional and medical goals  Care Tool:  Bathing  Bathing activity did not occur: Safety/medical concerns Body parts bathed by patient: Left arm, Chest, Abdomen         Bathing assist Assist Level: (+2 at bed level)     Upper Body Dressing/Undressing Upper body dressing   What is the patient wearing?: Pull over shirt    Upper body assist Assist Level: Total Assistance - Patient < 25%    Lower Body Dressing/Undressing Lower body dressing      What is the patient wearing?: Underwear/pull up     Lower body assist Assist for lower body dressing: Total Assistance - Patient < 25%     Toileting Toileting    Toileting assist Assist for toileting: Moderate Assistance - Patient 50 - 74% Assistive Device Comment: urinal   Transfers Chair/bed transfer  Transfers assist  Chair/bed transfer activity did not occur: Safety/medical concerns  Chair/bed transfer assist level: 2 Helpers(slide board)     Locomotion Ambulation   Ambulation assist   Ambulation activity did not occur: Safety/medical concerns          Walk 10 feet activity   Assist  Walk 10 feet activity did not occur: Safety/medical concerns        Walk 50 feet activity   Assist Walk 50 feet with 2 turns activity did not occur: Safety/medical concerns  Walk 150 feet activity   Assist Walk 150 feet activity did not occur: Safety/medical concerns         Walk 10 feet on uneven surface  activity   Assist Walk 10 feet on uneven surfaces activity did not occur: Safety/medical concerns         Wheelchair     Assist Will patient use wheelchair at discharge?: Yes Type of Wheelchair: Manual Wheelchair activity did not  occur: Refused  Wheelchair assist level: Maximal Assistance - Patient 25 - 49% Max wheelchair distance: 25 ft     Wheelchair 50 feet with 2 turns activity    Assist    Wheelchair 50 feet with 2 turns activity did not occur: Refused       Wheelchair 150 feet activity     Assist Wheelchair 150 feet activity did not occur: Refused        Medical Problem List and Plan: 1.Persistent right side weakness with decreased functional mobilitysecondary tocardioembolicbi-cerebral infarctions CIR PT, OT- still +2 max to stand weakn ess in both LE but pt denies residual from R SDH, Neurosurgery D/C summ indicated onset of LLE as presenting symptom. Also hx at the time of H and P, 08/27/2016 "he hit his head on a dresser and had bruising on the right side of his scalp about a month ago." 2. Antithrombotics: -DVT/anticoagulation:SCDs.Dopplers negative for DVT -eliquis on hold due to GIB -antiplatelet therapy: ASA on hold d/t GIB   3. Pain Management:Tylenol as needed 4. Mood:Provide emotional support -antipsychotic agents: None 5. Neuropsych: This patientiscapable of making decisions on hisown behalf. 6. Skin/Wound Care:Routine skin checks 7. Fluids/Electrolytes/Nutrition:Routine in and out's with follow-up chemistries 8. Right lower extremity cellulitis with sepsis/Streptococcus bacteremia. Intravenous Ancef completed 01/30/2019 also with chronic stasis dermatitis, added Ted hose, extensive PT visits for Lymphedema 2018 9. Upper GI bleed. EGD 3. Follow-up gastroenterology services. Continue PPI.Resume anticoag on Monday per Neuro10. Acute blood loss anemia. Patient has been transfused to do 6 units pack red blood cells.   11. Chronic atrial fibrillation as well as history of TAVR procedure.Diltiazem 120 mg daily.    -given ongoing burning in chest an EKG was ordered which demonstrated afib, rate  controlled. BMET,CBC, ckMB, troponin ordered also  -after EKG pt became acutely unresponsive without pulse. Code called  -initially v-fib  -pt resuscitated and transferred to acute hospital for intensive care  -pt talking and somewhat oriented after code  -family notified  12. Hypertension.Aldactone 12.5 mg daily. Monitor with increased mobility   13. Hyperlipidemia. Lipitor 14. OSA. CPAP 15. Gout left great toe: prednisone 10mg  bid x 4 doses  16.  Cognitive deficits due to CVA and hx of TBI  LOS: 4 days A FACE TO FACE EVALUATION WAS PERFORMED  Ranelle Oyster 02/03/2019, 8:13 AM

## 2019-02-03 NOTE — Consult Note (Signed)
CARDIOLOGY CONSULT NOTE  Patient ID: Joel Rogers MRN: [id number] DOB/AGE: [birth year] 68 y.o.  Admit date: 02/03/2019 Primary Physician [name], MD Primary Cardiologist Nanetta Batty, MD Chief Complaint  Chest pain Requesting  Dr. Denese Killings  HPI:   The patient has a history of chronic atrial fib and is status post TAVR Sept 2019.  He is in the hospital in rehab following CVA last month with a probable cardioembolic source.  Of note his hospital course was complicated by UGI bleed requiring transfusions.  Anticoagulation was held but then restarted.  He did have GI work up.  He also had AKI.  Of note he did have cath before the TAVR in Sept.  He had normal coronary arteries.    Earlier this morning he had burning chest pain.   He reports that it was a severe sharp pain. It did not radiate.  He had not had this before.  It was going on for about 30 minutes.  He did not have N/V or diaphoresisHe became unresponsive after complaining of this and was reported to have no pulse.  Code was called and he was found to be in vfib. Intubation was attempted but failed and he eventually had spontaneous breaths and so was not intubated.  He did require DCCV x 3 with return eventually to atrial fib.  I was able to review these strips.  The patient is awake and feels well now.  He denies any pain and doesn't recall events after the pain but is quite clear now. He has been doing rehab for two days and had no acute     Past Medical History:  Diagnosis Date  . Anemia    low iron  . Arthritis    bilateral knees  . Atrial fibrillation, chronic   . Chronic diastolic CHF (congestive heart failure) (HCC) 06/15/2018  . History of subdural hematoma   . Hyperlipidemia   . Hypertension   . Morbid obesity (HCC)   . Normal coronary arteries    by cardiac catheterization performed 03/14/06  . Pre-diabetes    pt denies  . S/P TAVR (transcatheter aortic valve replacement)    a. 07/25/18: Edwards Sapien  3 THV (size 26 mm, model # 9600TFX, serial # [id number]) by Dr. Laneta Simmers and Dr. Clifton Geramy Lamorte  . Sleep apnea    on CPAP  . Venous insufficiency     Past Surgical History:  Procedure Laterality Date  . BIOPSY  01/26/2019   Procedure: BIOPSY;  Surgeon: Bernette Redbird, MD;  Location: [facility] ENDOSCOPY;  Service: Endoscopy;;  . CRANIOTOMY Right 08/27/2016   Procedure: CRANIOTOMY HEMATOMA EVACUATION SUBDURAL;  Surgeon: Maeola Harman, MD;  Location: [facility] OR;  Service: Neurosurgery;  Laterality: Right;  . ESOPHAGOGASTRODUODENOSCOPY N/A 01/21/2019   Procedure: ESOPHAGOGASTRODUODENOSCOPY (EGD);  Surgeon: Vida Rigger, MD;  Location: [facility] ENDOSCOPY;  Service: Endoscopy;  Laterality: N/A;  . ESOPHAGOGASTRODUODENOSCOPY (EGD) WITH PROPOFOL N/A 01/22/2019   Procedure: ESOPHAGOGASTRODUODENOSCOPY (EGD) WITH PROPOFOL;  Surgeon: Bernette Redbird, MD;  Location: [facility] ENDOSCOPY;  Service: Endoscopy;  Laterality: N/A;  . ESOPHAGOGASTRODUODENOSCOPY (EGD) WITH PROPOFOL N/A 01/26/2019   Procedure: ESOPHAGOGASTRODUODENOSCOPY (EGD) WITH PROPOFOL;  Surgeon: Bernette Redbird, MD;  Location: [facility] ENDOSCOPY;  Service: Endoscopy;  Laterality: N/A;  . GASTRIC BYPASS  09/2008  . JOINT REPLACEMENT  08/31/2006   right hip  . MULTIPLE EXTRACTIONS WITH ALVEOLOPLASTY N/A 07/13/2018   Procedure: Extraction of tooth #'s 3,8,10, 23-26, 30and 32 with alveoloplasty and gross debridement of remaining teeth;  Surgeon: Charlynne Pander, DDS;  Location: [facility] OR;  Service: Oral Surgery;  Laterality: N/A;  . RIGHT HEART CATH N/A 07/06/2018   Procedure: RIGHT HEART CATH;  Surgeon: Kathleene HazelMcAlhany, Christopher D, MD;  Location: [facility] INVASIVE CV LAB;  Service: Cardiovascular;  Laterality: N/A;  . RIGHT/LEFT HEART CATH AND CORONARY ANGIOGRAPHY N/A 07/10/2018   Procedure: RIGHT/LEFT HEART CATH AND CORONARY ANGIOGRAPHY;  Surgeon: Dolores PattyBensimhon, Daniel R, MD;  Location: [facility] INVASIVE CV LAB;  Service: Cardiovascular;  Laterality: N/A;  . TEE WITHOUT CARDIOVERSION N/A 07/25/2018   Procedure:  TRANSESOPHAGEAL ECHOCARDIOGRAM (TEE);  Surgeon: Kathleene HazelMcAlhany, Christopher D, MD;  [facility];  Service: Open Heart Surgery;  Laterality: N/A;  . TEE WITHOUT CARDIOVERSION N/A 01/18/2019   Procedure: TRANSESOPHAGEAL ECHOCARDIOGRAM (TEE) WITH PROPOFOL;  Surgeon: Jonelle SidleMcDowell, Samuel G, MD;  Location: [facility] ORS;  Service: Cardiovascular;  Laterality: N/A;  . TOTAL HIP ARTHROPLASTY     right hip  . TRANSCATHETER AORTIC VALVE REPLACEMENT, TRANSFEMORAL  07/25/2018  . TRANSCATHETER AORTIC VALVE REPLACEMENT, TRANSFEMORAL N/A 07/25/2018   Procedure: TRANSCATHETER AORTIC VALVE REPLACEMENT, TRANSFEMORAL;  Surgeon: Kathleene HazelMcAlhany, Christopher D, MD;  Location: [facility] OR;  Service: Open Heart Surgery;  Laterality: N/A;    Allergies  Allergen Reactions  . Potassium-Containing Compounds     Stomach bleeding  . Kayexalate [Polystyrene] Other (See Comments)    Recommended to avoid by GI due to ischemic gastropathy   Medications Prior to Admission  Medication Sig Dispense Refill Last Dose  . acetaminophen (TYLENOL) 650 MG CR tablet Take 650 mg by mouth 2 (two) times daily as needed for pain.   unk  . amoxicillin (AMOXIL) 500 MG capsule Take 2,000 mg (4 tablets) one hour prior to dental visits. 8 capsule 3 unk  . apixaban (ELIQUIS) 5 MG TABS tablet Take 1 tablet (5 mg total) by mouth 2 (two) times daily. 180 tablet 1 unk  . aspirin 81 MG chewable tablet Chew 1 tablet (81 mg total) by mouth daily.   unk  . atorvastatin (LIPITOR) 80 MG tablet TAKE 1 TABLET BY MOUTH ONCE DAILY WITH  BREAKFAST (Patient taking differently: Take 80 mg by mouth daily. ) 90 tablet 1 unk  . colchicine 0.6 MG tablet TAKE 1 TABLET BY MOUTH TWICE DAILY (Patient taking differently: Take 0.6 mg by mouth 2 (two) times daily. ) 60 tablet 2 unk  . diltiazem (CARDIZEM CD) 120 MG 24 hr capsule Take 1 capsule (120 mg total) by mouth daily. 90 capsule 1 unk  . ergocalciferol (VITAMIN D2) 1.25 MG (50000 UT) capsule Take 50,000 Units by mouth once a week.   unk  .  spironolactone (ALDACTONE) 25 MG tablet Take 0.5 tablets (12.5 mg total) by mouth daily. 30 tablet 5 unk  . torsemide (DEMADEX) 20 MG tablet Take 40 mg by mouth as needed. Use as needed if weight is over 245 pounds.   unk   Family History  Problem Relation Age of Onset  . Hypertension Father   . Cancer Father        prob prostate  . Alzheimer's disease Mother   . Alzheimer's disease Maternal Grandfather   . Breast cancer Sister   . Cancer Brother        "brain tumor"    Social History   Socioeconomic History  . Marital status: Married    Spouse name: Adela LankJacqueline  . Number of children: 2  . Years of education: 7316  . Highest education level: Not on file  Occupational History  . 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 Financial resource strain: Not on file  . Food insecurity:    Worry: Not on file    Inability: Not on file  . Transportation needs:    Medical: Not on file    Non-medical: Not on file  Tobacco Use  . Smoking status: Former Games developer  . Smokeless tobacco: Never Used  Substance and Sexual Activity  . Alcohol use: No  . Drug use: No  . Sexual activity: Not Currently  Lifestyle  . Physical activity:    Days per week: Not on file    Minutes per session: Not on file  . Stress: Not on file  Relationships  . Social connections:    Talks on phone: Not on file    Gets together: Not on file    Attends religious service: Not on file    Active member of club or organization: Not on file    Attends meetings of clubs or organizations: Not on file    Relationship status: Not on file  . Intimate partner violence:    Fear of current or ex partner: Not on file    Emotionally abused: Not on file    Physically abused: Not on file    Forced sexual activity: Not on file  Other Topics Concern  . Not on file  Social History Narrative   Doctorate in ministry   Retired Data processing manager   Disability from hip  replacement   Lives with wife [name]   Exercises at [employer] - water exercise     ROS:    As stated in the HPI and negative for all other systems.  Physical Exam: There were no vitals taken for this visit.  GENERAL:  Well appearing HEENT:  Pupils equal round and reactive, fundi not visualized, oral mucosa unremarkable NECK:  No jugular venous distention, waveform within normal limits, carotid upstroke brisk and symmetric, no bruits, no thyromegaly LYMPHATICS:  No cervical, inguinal adenopathy LUNGS:  Clear to auscultation bilaterally BACK:  No CVA tenderness CHEST:  Unremarkable HEART:  PMI not displaced or sustained,S1 and S2 within normal limits, no S3, no clicks, no rubs, 3/6 apical systolic murmur with radiation out the aortic outflow tract, no diastolic murmurs, irregular ABD:  Flat, positive bowel sounds normal in frequency in pitch, no bruits, no rebound, no guarding, no midline pulsatile mass, no hepatomegaly, no splenomegaly EXT:  2 plus pulses throughout, diffuse legedema, no cyanosis no clubbing SKIN:  No rashes no nodules NEURO:  Cranial nerves II through XII grossly intact, motor with right sided weakness.  PSYCH:  Cognitively intact, oriented to person place and time    Labs: Lab Results  Component Value Date   BUN 11 01/31/2019   Lab Results  Component Value Date   CREATININE 0.93 01/31/2019   Lab Results  Component Value Date   NA 137 01/31/2019   K 3.5 01/31/2019   CL 108 01/31/2019   CO2 26 01/31/2019   No results found for: TROPONINI Lab Results  Component Value Date   WBC 14.8 (H) 01/31/2019   HGB 8.8 (L) 01/31/2019   HCT 27.2 (L) 01/31/2019   MCV 81.7 01/31/2019   PLT 130 (L) 01/31/2019   Lab Results  Component Value Date   CHOL 94 01/21/2019   HDL 11 (L) 01/21/2019   LDLCALC 70 01/21/2019   TRIG 67 01/21/2019   CHOLHDL 8.5 01/21/2019   Lab Results  Component Value Date   ALT 6 01/31/2019  AST 24 01/31/2019   ALKPHOS 79 01/31/2019    BILITOT 1.0 01/31/2019    ECHO  1. The left ventricle has normal systolic function with an ejection fraction of 60-65%. The cavity size was normal. There is mildly increased left ventricular wall thickness. Left ventricular diastolic Doppler parameters are indeterminate. No evidence  of left ventricular regional wall motion abnormalities.  2. The right ventricle has moderately reduced systolic function. The cavity was mildly enlarged. There is no increase in right ventricular wall thickness. Right ventricular systolic pressure is moderately elevated with an estimated pressure of 41.7  mmHg.  3. Left atrial size was severely dilated.  4. Right atrial size was severely dilated.  5. The mitral valve is normal in structure. There is mild mitral annular calcification present.  6. The tricuspid valve is normal in structure.  7. A 26 Edwards Sapien bioprosthetic aortic valve (TAVR) valve is present in the aortic position. Normal function observed with mean gradient 15.7 mmHg. No perivalvular leak.  8. The pulmonic valve was grossly normal. Pulmonic valve regurgitation is mild by color flow Doppler.  9. The aortic root is normal in size and structure. 10. The inferior vena cava was dilated in size with <50% respiratory variability. 11. No evidence of left ventricular regional wall motion abnormalities.    Radiology:   CXR: New LEFT basilar opacity, question atelectasis versus airspace disease/pneumonia.  01/20/18   EKG:   Atrial fib with rate 94, chronic RBBB ST depression lead V2 new since previous but no acute ST changes in other contiguous leads.  No significant change from EKG done early this morning or last month.   ASSESSMENT AND PLAN:   VFIB ARREST:  Unclear etiology.  No pervious CAD and NL EF in the past.  Will need to cycle enzymes.  Started heparin as below.  Check echo.  Check mag.  Potassium is mildly reduced.  Can supplement this.  Might need repeat cath but I will follow to  determine.    CHEST PAIN:  Etiology unclear although he had GI issues as above.  Cycle enzymes  CHRONIC ATRIAL FIB:   Of note Eliquis had been on hold.  This was to be restarted in 4 days post discharge.  Rate looks to be controlled.    UGI BLEED:   Needs to be on heparin now.  Needs careful follow up for GI bleeding given recent events.  On PPI   HTN:    BP stable.    TAVR:  Follow up echo as above.    SignedRollene Rotunda 02/03/2019, 9:04 AM

## 2019-02-03 NOTE — Progress Notes (Signed)
Per report patient c/o burning chest pain after being repositioned on the bed night RN gave maalox. MD in room and ordered another maalox. Patient was taking maalox at time of round with charge RN. IV team in room and EKG was just done. After leaving the room Code was called. Patient was transferred to [facility]. Wife was notified by RN. Report given to receiving RN. Patient transferred with help of  Rapid response RN.

## 2019-02-03 NOTE — Progress Notes (Signed)
    Called back to see the patient with NSVT.  He remains without acute symptoms.  Reviewed bedside echo by Dr. Denese Killings. EF is overall well preserved but question anterolateral mild hypokinesis.  No acute obvious valve abnormalities with stable TAVR.  Moderate LVH and severe LAE that has previously been noted is obvious.  The etiology of this could be ischemic/embolic.  He is on ASA and heparin.  He was not previously on anticoagulation because of his GI bleed.  Trop is back and is mildly elevated.  At this time I will treat with IV Lidocaine and continue current anticoagulation.   I would like to hold off on urgent cardiac cath unless he has continued arrhythmia.

## 2019-02-03 NOTE — Progress Notes (Signed)
RN called to room. Patient complaining of non-radiating chest pain after he was pulled up the bed. Pt described pain as aching and burning. Patient stated it also feels like acid coming up his chest. Patient denies shortness of breath, positive sweating. Vital signs checked. Dr. Riley Kill informed with order. Maalox po given. Will monitor

## 2019-02-03 NOTE — Progress Notes (Signed)
ANTICOAGULATION CONSULT NOTE - Initial Consult  Pharmacy Consult for heparin Indication: chest pain/ACS and atrial fibrillation  Allergies  Allergen Reactions  . Potassium-Containing Compounds     Stomach bleeding  . Kayexalate [Polystyrene] Other (See Comments)    Recommended to avoid by GI due to ischemic gastropathy    Patient Measurements:   Heparin Dosing Weight: 106kg  Vital Signs: Temp: 98.1 F (36.7 C) (03/14 0710) Temp Source: Oral (03/14 0710) BP: 134/86 (03/14 0744) Pulse Rate: 88 (03/14 0744)  Labs: No results for input(s): HGB, HCT, PLT, APTT, LABPROT, INR, HEPARINUNFRC, HEPRLOWMOCWT, CREATININE, CKTOTAL, CKMB, TROPONINI in the last 72 hours.  Estimated Creatinine Clearance: 103.3 mL/min (by C-G formula based on SCr of 0.93 mg/dL).   Medical History: Past Medical History:  Diagnosis Date  . Anemia    low iron  . Arthritis    bilateral knees  . Atrial fibrillation, chronic   . Chronic diastolic CHF (congestive heart failure) (HCC) 06/15/2018  . History of subdural hematoma   . Hyperlipidemia   . Hypertension   . Morbid obesity (HCC)   . Normal coronary arteries    by cardiac catheterization performed 03/14/06  . Pre-diabetes    pt denies  . S/P TAVR (transcatheter aortic valve replacement)    a. 07/25/18: Edwards Sapien 3 THV (size 26 mm, model # 9600TFX, serial # [id number]) by Dr. Laneta Simmers and Dr. Clifton James  . Sleep apnea    on CPAP  . Venous insufficiency     Medications:  Apixaban prior to admission  Assessment: 68 year old male originally admitted to [facility] with bacteremia and cellulitis. Found to have bilateral CVAs and small SAH. Also found blood and blood clot in stomach but unable to anticoagulate. Patient was later transferred to [facility] on 3/10. Patient was on apixaban prior to admission, g/t GIB this was never restarted during this admission.   This am patient suffered cardiac arrest requiring cpr/defibrillation. Patient is now awake, alert on NRB.  New orders to start IV heparin for r/o ACS and afib. Will not bolus given his recent GIB and SAH.   Goal of Therapy:  Heparin level 0.3-0.5 units/ml Monitor platelets by anticoagulation protocol: Yes   Plan:  Start heparin infusion at 1400 units/hr Check anti-Xa level in 8 hours and daily while on heparin Continue to monitor H&H and platelets  Sheppard Coil PharmD., BCPS Clinical Pharmacist 02/03/2019 9:13 AM

## 2019-02-03 NOTE — Progress Notes (Signed)
RT responded to code blue on 4W17. RT attempted to intubate x 3 using Mac 4 with 7.5 but no color change noted on etco2. After each attempt pt was manually ventilated. Additional RT attempted x2 with MAC 3 without success. Pt breathing spontaneously with ROSC at this point. Pt placed on NRB and transferred to [place]. Report called to receiving RT

## 2019-02-03 NOTE — Code Documentation (Signed)
  Patient Name: Joel Rogers   MRN: [id number]   Date of Birth/ Sex: May 31, 1951 , male      Admission Date: (Not on file)  Attending Provider: Lynnell Catalan, MD  Primary Diagnosis: <principal problem not specified>   Indication: Pt was in his usual state of health until this AM, when he was noted to be in atrial fibrillation. He later complained of chest pain and became unresponsive. Was noted to be in ventricular fibrillation. Code blue was subsequently called. At the time of arrival on scene, ACLS protocol was underway.   Technical Description:  - CPR performance duration:  14 minutes  - Was defibrillation or cardioversion used? Yes   - Was external pacer placed? No  - Was patient intubated pre/post CPR? No   Medications Administered: Y = Yes; Blank = No Amiodarone  Y  Atropine    Calcium    Epinephrine  Y  Lidocaine    Magnesium    Norepinephrine    Phenylephrine    Sodium bicarbonate    Vasopressin     Post CPR evaluation:  - Final Status - Was patient successfully resuscitated ? Yes - What is current rhythm? Atrial fibrillation  - What is current hemodynamic status? stable  Miscellaneous Information:  - Labs sent, including: Not collected yet  - Primary team notified?  Yes  - Family Notified? No  - Additional notes/ transfer status: Transferred to [facility]     Reymundo Poll, MD  02/03/2019, 8:22 AM

## 2019-02-03 NOTE — Discharge Summary (Deleted)
  The note originally documented on this encounter has been moved the the encounter in which it belongs.  

## 2019-02-03 NOTE — Discharge Summary (Signed)
Physician Discharge Summary  Patient ID: Joel Rogers MRN: [id number] DOB/AGE: [birth year] 68 y.o.  Admit date: 02/03/2019 Discharge date: 02/03/2019  Discharge Diagnoses:  Active Problems:   Acute coronary syndrome (HCC) Cardioembolic bi-cerebral infarctions DVT/anticoagulation.  SCDs. Right lower extremity cellulitis with sepsis/Streptococcus bacteremia Upper GI bleed. Chronic atrial fibrillation as well as history of TAVR procedure Hypertension Hyperlipidemia OSA with CPAP Gout left great toe  Discharged Condition: Guarded  Significant Diagnostic Studies: Dg Chest 2 View  Result Date: 01/13/2019 CLINICAL DATA:  Weakness.  Pain in shoulders. EXAM: CHEST - 2 VIEW COMPARISON:  07/25/2018 FINDINGS: Cardiomegaly. Prior aortic valve repair. No confluent airspace opacity, effusion or overt edema. No acute bony abnormality. IMPRESSION: Cardiomegaly.  No active disease. Electronically Signed   By: Charlett NoseKevin  Dover M.D.   On: 01/13/2019 16:53   Dg Shoulder Right  Result Date: 01/13/2019 CLINICAL DATA:  Right shoulder pain, weakness EXAM: RIGHT SHOULDER - 2+ VIEW COMPARISON:  None. FINDINGS: Early subacromial spurring and spurring at the rotator cuff insertion. Slight joint space narrowing in the glenohumeral joint. No acute bony abnormality. Specifically, no fracture, subluxation, or dislocation. IMPRESSION: Early arthritic changes in the right shoulder. No acute bony abnormality. Electronically Signed   By: Charlett NoseKevin  Dover M.D.   On: 01/13/2019 16:54   Ct Head Wo Contrast  Result Date: 01/20/2019 CLINICAL DATA:  68 y/o M; subarachnoid hemorrhage and stroke for follow-up. EXAM: CT HEAD WITHOUT CONTRAST TECHNIQUE: Contiguous axial images were obtained from the base of the skull through the vertex without intravenous contrast. COMPARISON:  01/19/2019 MRI of the head. FINDINGS: Brain: Multiple foci of hypoattenuation are present within the cerebellum and scattered over the cerebral convexities and  within periventricular white matter corresponding to areas of infarction on the prior MRI of the brain. Findings are stable given differences in technique. Stable small volume of subarachnoid hemorrhage within the left central sulcus. No new stroke, hemorrhage, mass effect, intracranial hemorrhage, or herniation identified. Vascular: Calcific atherosclerosis of carotid siphons. No hyperdense vessel identified. Skull: Negative for fracture or focal lesion. Stable right frontal region craniotomy chronic postsurgical changes. Sinuses/Orbits: Mucosal thickening of the left frontal sinus antrum and anterior ethmoid air cells. Additional visible paranasal sinuses and the mastoid air cells are normally aerated. Orbits are unremarkable. Other: None. IMPRESSION: 1. Multiple foci of infarction are present scattered throughout the supratentorial cortex, periventricular white matter, and throughout the cerebellum. The distribution of infarction is stable from prior MRI given differences in technique. 2. Stable small focus of acute subarachnoid hemorrhage within the left central sulcus. Additional areas of hemorrhage suggested on MRI are not visible on CT, possibly chronic hemosiderin deposition. 3. No new acute intracranial abnormality identified. Electronically Signed   By: Mitzi HansenLance  Furusawa-Stratton M.D.   On: 01/20/2019 18:40   Mr Maxine GlennMra Head Wo Contrast  Result Date: 01/19/2019 CLINICAL DATA:  Right upper extremity weakness EXAM: MRI HEAD WITHOUT CONTRAST MRA HEAD WITHOUT CONTRAST TECHNIQUE: Multiplanar, multiecho pulse sequences of the brain and surrounding structures were obtained without intravenous contrast. Angiographic images of the head were obtained using MRA technique without contrast. COMPARISON:  Head CT 10/26/2016 FINDINGS: MRI HEAD FINDINGS BRAIN: There is scattered multifocal diffusion restriction throughout the brain. The largest areas of diffusion abnormality are in the cerebellum. There are bilateral areas  of ischemia in the deep watershed distribution. There are multiple bilateral peripheral subcortical foci of ischemia. Areas along the left postcentral gyrus and right anterior convexity are suggestive subarachnoid hemorrhage. There is also magnetic susceptibility effect in  the left cerebellum, also likely indicating an area of hemorrhage. There is mild edema corresponding to the sites of diffusion abnormality. Mild generalized volume loss. No midline shift. SKULL AND UPPER CERVICAL SPINE: The visualized skull base, calvarium, upper cervical spine and extracranial soft tissues are normal. SINUSES/ORBITS: No fluid levels or advanced mucosal thickening. No mastoid or middle ear effusion. The orbits are normal. MRA HEAD FINDINGS POSTERIOR CIRCULATION: --Basilar artery: Normal. --Posterior cerebral arteries: Normal. The right PCA is partially supplied by the posterior communicating artery. --Superior cerebellar arteries: Normal. --Inferior cerebellar arteries: Normal left AICA. The posterior inferior cerebral arteries are normal. ANTERIOR CIRCULATION: --Intracranial internal carotid arteries: Normal. --Anterior cerebral arteries: Normal. Both A1 segments are present. Patent anterior communicating artery. --Middle cerebral arteries: Moderate narrowing of the M2 segments bilaterally. --Posterior communicating arteries: Present on the right, absent on the left. IMPRESSION: 1. Numerous scattered foci of acute ischemia throughout the cerebrum and cerebellum. Many of these lesions are most suggestive of embolic infarcts, given the widespread distribution over multiple vascular territories. Others are in a deep watershed distribution, as is seen in the setting of hypoperfusion events. 2. Areas of magnetic susceptibility effect superimposed on diffusion abnormality along the left postcentral gyrus, right frontal convexity and lateral ventricular atria are suggestive subarachnoid blood. Noncontrast head CT recommended for  clarification. 3. No midline shift or other mass effect. 4. No proximal intracranial arterial occlusion. Moderate narrowing of the M2 segments of both middle cerebral arteries. Critical Value/emergent results were called by telephone at the time of interpretation on 01/19/2019 at 5:32 pm to Dr. Durward Mallard [facility] , who verbally acknowledged these results. Electronically Signed   By: Deatra Robinson M.D.   On: 01/19/2019 17:33   Mr Brain Wo Contrast  Result Date: 01/19/2019 CLINICAL DATA:  Right upper extremity weakness EXAM: MRI HEAD WITHOUT CONTRAST MRA HEAD WITHOUT CONTRAST TECHNIQUE: Multiplanar, multiecho pulse sequences of the brain and surrounding structures were obtained without intravenous contrast. Angiographic images of the head were obtained using MRA technique without contrast. COMPARISON:  Head CT 10/26/2016 FINDINGS: MRI HEAD FINDINGS BRAIN: There is scattered multifocal diffusion restriction throughout the brain. The largest areas of diffusion abnormality are in the cerebellum. There are bilateral areas of ischemia in the deep watershed distribution. There are multiple bilateral peripheral subcortical foci of ischemia. Areas along the left postcentral gyrus and right anterior convexity are suggestive subarachnoid hemorrhage. There is also magnetic susceptibility effect in the left cerebellum, also likely indicating an area of hemorrhage. There is mild edema corresponding to the sites of diffusion abnormality. Mild generalized volume loss. No midline shift. SKULL AND UPPER CERVICAL SPINE: The visualized skull base, calvarium, upper cervical spine and extracranial soft tissues are normal. SINUSES/ORBITS: No fluid levels or advanced mucosal thickening. No mastoid or middle ear effusion. The orbits are normal. MRA HEAD FINDINGS POSTERIOR CIRCULATION: --Basilar artery: Normal. --Posterior cerebral arteries: Normal. The right PCA is partially supplied by the posterior communicating artery. --Superior  cerebellar arteries: Normal. --Inferior cerebellar arteries: Normal left AICA. The posterior inferior cerebral arteries are normal. ANTERIOR CIRCULATION: --Intracranial internal carotid arteries: Normal. --Anterior cerebral arteries: Normal. Both A1 segments are present. Patent anterior communicating artery. --Middle cerebral arteries: Moderate narrowing of the M2 segments bilaterally. --Posterior communicating arteries: Present on the right, absent on the left. IMPRESSION: 1. Numerous scattered foci of acute ischemia throughout the cerebrum and cerebellum. Many of these lesions are most suggestive of embolic infarcts, given the widespread distribution over multiple vascular territories. Others are in a deep watershed  distribution, as is seen in the setting of hypoperfusion events. 2. Areas of magnetic susceptibility effect superimposed on diffusion abnormality along the left postcentral gyrus, right frontal convexity and lateral ventricular atria are suggestive subarachnoid blood. Noncontrast head CT recommended for clarification. 3. No midline shift or other mass effect. 4. No proximal intracranial arterial occlusion. Moderate narrowing of the M2 segments of both middle cerebral arteries. Critical Value/emergent results were called by telephone at the time of interpretation on 01/19/2019 at 5:32 pm to Dr. Durward Mallard [facility] , who verbally acknowledged these results. Electronically Signed   By: Deatra Robinson M.D.   On: 01/19/2019 17:33   Mr Cervical Spine Wo Contrast  Result Date: 01/19/2019 CLINICAL DATA:  Shoulder pain and progressive weakness EXAM: MRI CERVICAL SPINE WITHOUT CONTRAST TECHNIQUE: Multiplanar, multisequence MR imaging of the cervical spine was performed. No intravenous contrast was administered. COMPARISON:  None. FINDINGS: Alignment: Normal. Vertebrae: No focal marrow lesion. No compression fracture or evidence of discitis osteomyelitis. Cord: Normal caliber and signal. Posterior Fossa, vertebral  arteries, paraspinal tissues: Visualized posterior fossa is normal. Vertebral artery flow voids are preserved. No prevertebral effusion. Disc levels: Sagittal imaging includes the atlantoaxial joint to the level of the T2-3 disc space, with axial imaging of the disc spaces from C2-3 to C6-7. Axial images are degraded by motion. Within that limitation, there is no spinal canal stenosis above the C5 level. At C5-6, there is a small central disc protrusion that narrows the ventral thecal sac with mild spinal canal stenosis. At C6-7, there is a intermediate disc bulge causing mild spinal canal stenosis. There is poor visualization of the neural foramina because of motion. IMPRESSION: 1. Motion degraded examination, particularly limiting assessment of the neural foramina. 2. Mild spinal canal stenosis at C5-6 and C6-7. 3. No acute abnormality of the cervical spine. Electronically Signed   By: Deatra Robinson M.D.   On: 01/19/2019 17:45   US Renal  Result Date: 01/24/2019 CLINICAL DATA:  Acute renal injury EXAM: RENAL / URINARY TRACT ULTRASOUND COMPLETE COMPARISON:  CT from 07/11/2008 FINDINGS: Right Kidney: Renal measurements: 12.7 x 6.1 x 5.7 cm. = volume: 236 mL. Lobulations are noted consistent with focal scarring seen on prior CT examination. No mass lesion or hydronephrosis is noted. Left Kidney: Renal measurements: 13.2 x 6.3 x 6.1 cm = volume: 272 mL. Echogenicity within normal limits. No mass or hydronephrosis visualized. Bladder: Appears normal for degree of bladder distention. IMPRESSION: Changes of scarring within the right kidney. No acute abnormality is noted. Electronically Signed   By: Alcide Clever M.D.   On: 01/24/2019 07:15   US Venous Img Lower Unilateral Right  Result Date: 01/14/2019 CLINICAL DATA:  Right lower extremity edema and erythema for 1 day EXAM: RIGHT LOWER EXTREMITY VENOUS DUPLEX ULTRASOUND TECHNIQUE: Doppler venous assessment of the right lower extremity deep venous system was  performed, including characterization of spectral flow, compressibility, and phasicity. COMPARISON:  None. FINDINGS: There is complete compressibility of the right common femoral, femoral, and popliteal veins. Doppler analysis demonstrates respiratory phasicity and augmentation of flow with calf compression. No obvious superficial vein or calf vein thrombosis. IMPRESSION: No evidence of right lower extremity DVT. Electronically Signed   By: Jolaine Click M.D.   On: 01/14/2019 13:57   US Venous Img Upper Uni Right  Result Date: 01/18/2019 CLINICAL DATA:  Right upper extremity edema. History of diabetes. Evaluate DVT. EXAM: RIGHT UPPER EXTREMITY VENOUS DOPPLER ULTRASOUND TECHNIQUE: Gray-scale sonography with graded compression, as well as color Doppler  and duplex ultrasound were performed to evaluate the upper extremity deep venous system from the level of the subclavian vein and including the jugular, axillary, basilic, radial, ulnar and upper cephalic vein. Spectral Doppler was utilized to evaluate flow at rest and with distal augmentation maneuvers. COMPARISON:  None. FINDINGS: Contralateral Subclavian Vein: Respiratory phasicity is normal and symmetric with the symptomatic side. No evidence of thrombus. Normal compressibility. Internal Jugular Vein: No evidence of thrombus. Normal compressibility, respiratory phasicity and response to augmentation. Subclavian Vein: No evidence of thrombus. Normal compressibility, respiratory phasicity and response to augmentation. Axillary Vein: No evidence of thrombus. Normal compressibility, respiratory phasicity and response to augmentation. Cephalic Vein: No evidence of thrombus. Normal compressibility, respiratory phasicity and response to augmentation. Basilic Vein: No evidence of thrombus. Normal compressibility, respiratory phasicity and response to augmentation. Brachial Veins: No evidence of thrombus. Normal compressibility, respiratory phasicity and response to  augmentation. Radial Veins: No evidence of thrombus. Normal compressibility, respiratory phasicity and response to augmentation. Ulnar Veins: No evidence of thrombus. Normal compressibility, respiratory phasicity and response to augmentation. Venous Reflux:  None visualized. Other Findings:  None visualized. IMPRESSION: No evidence of DVT within the right upper extremity. Electronically Signed   By: Simonne Come M.D.   On: 01/18/2019 15:10   Dg Chest Port 1 View  Result Date: 02/03/2019 CLINICAL DATA:  Ventricular tachycardia EXAM: PORTABLE CHEST 1 VIEW COMPARISON:  January 21, 2019. FINDINGS: Central catheter tip is in the left innominate vein. No pneumothorax. There is no evident edema or consolidation. There is generalized cardiomegaly with pulmonary vascularity normal. There is a prosthetic aortic valve. There is aortic atherosclerosis. No evident adenopathy. No bone lesions. IMPRESSION: Central catheter tip in left innominate vein. No pneumothorax. Generalized cardiomegaly. No edema or consolidation. Aortic Atherosclerosis (ICD10-I70.0). Electronically Signed   By: Bretta Bang III M.D.   On: 02/03/2019 14:28   Dg Chest Port 1 View  Result Date: 01/21/2019 CLINICAL DATA:  Cough and leukocytosis. EXAM: PORTABLE CHEST 1 VIEW COMPARISON:  01/15/2019 FINDINGS: Marked cardiomegaly and aortic valve replacement again noted. Pulmonary vascular congestion identified. LEFT basilar opacity noted. No pleural effusion or pneumothorax. A LEFT PICC line is again identified with tip overlying the LOWER SVC. IMPRESSION: New LEFT basilar opacity, question atelectasis versus airspace disease/pneumonia. Marked cardiomegaly with pulmonary vascular congestion. Electronically Signed   By: Harmon Pier M.D.   On: 01/21/2019 11:37   Dg Chest Port 1 View  Result Date: 01/15/2019 CLINICAL DATA:  Bacteremia. EXAM: PORTABLE CHEST 1 VIEW COMPARISON:  01/13/2019 FINDINGS: Lungs are adequately inflated without focal airspace  consolidation or effusion. There is moderate stable cardiomegaly. Remainder of the exam is unchanged. IMPRESSION: No acute cardiopulmonary disease. Moderate stable cardiomegaly. Electronically Signed   By: Elberta Fortis M.D.   On: 01/15/2019 13:38   Dg Hip Unilat With Pelvis 2-3 Views Right  Result Date: 01/16/2019 CLINICAL DATA:  Right hip pain EXAM: DG HIP (WITH OR WITHOUT PELVIS) 2-3V RIGHT COMPARISON:  None. FINDINGS: Changes of right hip replacement. No hardware complicating feature. No acute bony abnormality. No fracture, subluxation or dislocation. IMPRESSION: Prior right hip replacement.  No acute bony abnormality. Electronically Signed   By: Charlett Nose M.D.   On: 01/16/2019 19:48   [place] Ekg Site Rite  Result Date: 01/18/2019 If [facility] image not attached, placement could not be confirmed due to current cardiac rhythm.  US Abdomen Limited Ruq  Result Date: 01/18/2019 CLINICAL DATA:  Increased liver function tests. EXAM: ULTRASOUND ABDOMEN LIMITED RIGHT  UPPER QUADRANT COMPARISON:  CT abdomen pelvis July 11, 2018 FINDINGS: Gallbladder: No gallstones or wall thickening visualized. The gallbladder is distended. No sonographic Murphy sign noted by sonographer. Common bile duct: Diameter: 5 mm Liver: No focal lesion identified. Within normal limits in parenchymal echogenicity. Nodular contour of the liver is noted. Portal vein is patent on color Doppler imaging with normal direction of blood flow towards the liver. IMPRESSION: No evidence of acute cholecystitis. Nodular contour of liver, this can be seen in cirrhosis of liver. Electronically Signed   By: Sherian Rein M.D.   On: 01/18/2019 12:22    Labs:  Basic Metabolic Panel: Recent Labs  Lab 01/28/19 0530 01/29/19 0503 01/30/19 0830 01/31/19 0612 02/03/19 0834 02/03/19 1416  NA 137 135 135 137 136 136  K 3.5 3.3* 3.4* 3.5 3.0* 3.6  CL 112* 108 108 108 109 109  CO2 20* 20* 24 26 20* 22  GLUCOSE 118* 113* 130* 106* 140* 108*  BUN  CREATININE 1.14 1.00 1.01 0.93 0.96 1.10  CALCIUM 7.8* 7.7* 8.1* 8.2* 8.2* 8.1*  MG  --   --   --   --  1.7 1.9    CBC: Recent Labs  Lab 01/30/19 0830 01/31/19 0612 02/03/19 0834  WBC 16.7* 14.8* 26.5*  NEUTROABS 14.0* 12.2*  --   HGB 9.3* 8.8* 9.3*  HCT 29.1* 27.2* 28.7*  MCV 81.5 81.7 82.0  PLT 121* 130* 136*   Family history.  Alzheimer's disease in maternal grandfather and mother, breast cancer in a sister, cancer in his brother and father, hypertension in his father  CBG: No results for input(s): GLUCAP in the last 168 hours.  Brief HPI:    Jet Armbrust is a 68 year old right-handed male history of diastolic congestive heart failure, aortic stenosis status post TAVR procedure September 2019, atrial fibrillation maintained on Eliquis, OSA with CPAP, hypertension.  Per chart review patient lives with spouse community ambulator and active prior to admission.  [facility]ed to [facility]13[facility]2019 secondary to right and left upper extremity pain with recent fall right side weakness findings of sepsis due to Staphylococcus bacteremia and cellulitis during admission maintained on antibiotic therapy.  Noted persistent right side weakness.  MRI of the brain revealed multiple acute ischemic infarctions in a pattern most consistent with cardioembolic stroke.  Patient was transferred to [facility] for further evaluation.  Venous Doppler studies negative for DVT.  Findings of elevated liver function studies ultrasound no focal lesion identified.  Follow-up MRI of the brain with numerous scattered foci of acute ischemia throughout the cerebrum and cerebellum.  Many of these lesions suggestive of embolic pattern.  Echocardiogram with ejection fraction of 60% normal systolic function.  TEE completed showing no vegetation or bioprosthetic aortic valve.  Patient remained on Eliquis for atrial fibrillation with the addition of aspirin therapy.  He developed coffee-ground  emesis with acute blood loss anemia requiring transfusions totaling 6 units packed red blood cells and gastroenterology consulted EGD completed with findings of red blood in the lower third of the esophagus clotted blood in the entire stomach.  Blood thinners were placed on hold follow-up upper GI endoscopy again completed 01/26/2019 showing no active bleeding or blood present appeared to show some resolving ischemic changes in the region of the gastric pouch and the anastomosis.  His diet was slowly advanced.  Again his aspirin and Plavix remained on hold for approximately 3 to 5 days.  It was recommended  to continue Protonix twice daily.  Hospital course AKI with creatinine 1.1-1.23 with renal services consulted 01/23/2019 AKI likely secondary to ischemic ATN in the setting of sepsis.  Maintain on gentle IV fluids creatinine improved to 1.00.  Completed a course of Ancef for sepsis.  Therapy evaluations completed and patient was admitted for a comprehensive rehab program Hospital Course: CORDARRYL MONRREAL was admitted to rehab 02/03/2019 for inpatient therapies to consist of PT, ST and OT at least three hours five days a week. Past admission physiatrist, therapy team and rehab RN have worked together to provide customized collaborative inpatient rehab.   Rehab course: During patient's stay in rehab weekly team conferences were held to monitor patient's progress, set goals and discuss barriers to discharge. At admission, patient required moderate to max assist stand pivot transfers max assist ambulate 4 feet rolling walker plus to physical assist supine to sit.  Minimum upper body and max assist lower body ADLs  Physical exam.  Blood pressure 135/80 pulse 72 temperature 97 respirations 20 oxygen saturations 99% room air. Head.  Normocephalic and atraumatic poor dentition missing multiple teeth Eyes.  Pupils are equal round and reactive to light EOMs normal.  Right eye exhibits no discharge left eye no discharge.   No nystagmus. Neck was supple normal range of motion no thyromegaly without bruit. Cardiac rate normal rate and regular Rhythm Respiratory.  Effort normal no respiratory distress no wheezes he had no rails. GI.  Exhibits no distention.  No abdominal tenderness.  No rebound. Musculoskeletal +1 edema lower extremities.  Palpable pedal pulses. Neurological.  Patient awake alert fair insight and awareness oriented to month and year.  Some decreased short-term memory.  Normal language.  Follows all commands.  Right upper extremity 3 out of 5 proximal to distal left upper extremity 3+ to 4 out of 5 proximal to distal.  Right lower extremity 3 out of 5 proximal to distal.  Left lower extremity 3- to 4 out of 5 with inhibition to pain of left great toe.  He  has had improvement in activity tolerance, balance, postural control as well as ability to compensate for deficits. He/She has had improvement in functional use RUE/LUE  and RLE/LLE as well as improvement in awareness.  Patient received weekly collaborative interdisciplinary team conference to discuss estimated length stay family teaching any barriers to discharge.  Patient was requiring plus to assist with max cueing and assistance for extremity placement with limited significant right lower extremity pain.  Requires dependent assist for donning clean briefs and shorts from bed level.  Transfers sit to supine with plus to assist with extra time to transfer.  Transfer slightly downhill bed to wheelchair with sliding board plus to assist.  Propels his wheelchair with left hemi-technique with max assist for steering 2/2 decreased ability to do so with left lower extremity and max distance of 25 feet at a time.  With Occupational Therapy he completed sliding board transfers wheelchair therapy mat with max assist.  In regards to patient's cardioembolic infarction he had remained stable participating with therapies.  His aspirin and Eliquis had remained on hold  secondary to upper GI bleed with intentions to resume on 02/05/2019 and no bleeding episodes.  He continued with SCDs for DVT prophylaxis venous Doppler studies negative.  Right lower extremity cellulitis with sepsis Ancef had been completed 01/30/2019 patient did have some chronic stasis dermatitis.  He had received extensive therapy visits in the past for lymphedema.  Chronic atrial fibrillation as well  as history of TAVR procedure he continued on Cardizem.  Blood pressure is controlled monitored with Aldactone.  Lipitor for hyperlipidemia.  He had received prednisone for some left great toe gout.  Noted on the morning of 02/03/2019 patient with burning chest pain which felt like acid reflux.  Denied shortness of breath.  Order was given for Maalox with little relief.  Patient became unresponsive a code was called was noted to be in ventricular fibrillation.  Troponin was noted to be elevated.  Patient was discharged to acute care service in guarded condition for ongoing care follow-up per critical care medicine as well as cardiology services.  Intravenous heparin was initiated as well as aspirin.       Disposition:  Discharged to acute care services   Diet:NPO to be reestablished by medical team  Special Instructions:   Medications at time of discharge.  As noted by medical team on discharge from rehab services   Signed: Charlton Amor 02/03/2019, 8:46 PM

## 2019-02-04 ENCOUNTER — Inpatient Hospital Stay (HOSPITAL_COMMUNITY): Payer: Medicare Other

## 2019-02-04 ENCOUNTER — Inpatient Hospital Stay (HOSPITAL_COMMUNITY): Payer: Self-pay | Admitting: Speech Pathology

## 2019-02-04 LAB — BASIC METABOLIC PANEL
ANION GAP: 5 (ref 5–15)
BUN: 17 mg/dL (ref 8–23)
CO2: 22 mmol/L (ref 22–32)
Calcium: 8.1 mg/dL — ABNORMAL LOW (ref 8.9–10.3)
Chloride: 109 mmol/L (ref 98–111)
Creatinine, Ser: 1.24 mg/dL (ref 0.61–1.24)
GFR calc Af Amer: >60 mL/min (ref >60)
GFR calc non Af Amer: 59 mL/min — ABNORMAL LOW (ref >60)
Glucose, Bld: 109 mg/dL — ABNORMAL HIGH (ref 70–99)
POTASSIUM: 4.6 mmol/L (ref 3.5–5.1)
Sodium: 136 mmol/L (ref 135–145)

## 2019-02-04 LAB — CBC
HCT: 28.6 % — ABNORMAL LOW (ref 39.0–52.0)
Hemoglobin: 9.2 g/dL — ABNORMAL LOW (ref 13.0–17.0)
MCH: 26.4 pg (ref 26.0–34.0)
MCHC: 32.2 g/dL (ref 30.0–36.0)
MCV: 82.2 fL (ref 80.0–100.0)
PLATELETS: 138 10*3/uL — AB (ref 150–400)
RBC: 3.48 MIL/uL — AB (ref 4.22–5.81)
RDW: 20.9 % — ABNORMAL HIGH (ref 11.5–15.5)
WBC: 15.6 10*3/uL — ABNORMAL HIGH (ref 4.0–10.5)
nRBC: 0 % (ref 0.0–0.2)

## 2019-02-04 LAB — HEPARIN LEVEL (UNFRACTIONATED): Heparin Unfractionated: 0.55 IU/mL (ref 0.30–0.70)

## 2019-02-04 LAB — LIPID PANEL
Cholesterol: 104 mg/dL (ref 0–200)
HDL: 35 mg/dL — ABNORMAL LOW (ref >40)
LDL Cholesterol: 58 mg/dL (ref 0–99)
Total CHOL/HDL Ratio: 3 RATIO
Triglycerides: 53 mg/dL (ref <150)
VLDL: 11 mg/dL (ref 0–40)

## 2019-02-04 LAB — LIDOCAINE LEVEL: Lidocaine Lvl: 6.3 ug/mL — ABNORMAL HIGH (ref 1.5–5.0)

## 2019-02-04 LAB — TROPONIN I

## 2019-02-04 MED ORDER — SODIUM CHLORIDE 0.9 % IV SOLN
INTRAVENOUS | Status: DC
  Administered 2019-02-05: 02:00:00 via INTRAVENOUS

## 2019-02-04 MED ORDER — SODIUM CHLORIDE 0.9 % WEIGHT BASED INFUSION: 3.0000 mL/kg/h | INTRAVENOUS | Status: DC

## 2019-02-04 MED ORDER — TRAMADOL-ACETAMINOPHEN 37.5-325 MG PO TABS
1.0000 | ORAL_TABLET | Freq: Four times a day (QID) | ORAL | Status: DC | PRN
  Administered 2019-02-05 (×2): 1 via ORAL
  Administered 2019-02-06 (×2): 2 via ORAL
  Administered 2019-02-06: 1 via ORAL
  Administered 2019-02-07: 2 via ORAL
  Administered 2019-02-07 – 2019-02-08 (×3): 1 via ORAL
  Administered 2019-02-09: 2 via ORAL
  Administered 2019-02-10: 1 via ORAL
  Filled 2019-02-04: qty 1
  Filled 2019-02-04 (×4): qty 2
  Filled 2019-02-04 (×2): qty 1
  Filled 2019-02-04: qty 2
  Filled 2019-02-04 (×2): qty 1
  Filled 2019-02-04: qty 2
  Filled 2019-02-04: qty 1
  Filled 2019-02-04 (×2): qty 2

## 2019-02-04 MED ORDER — SODIUM CHLORIDE 0.9% FLUSH: 3.0000 mL | INTRAVENOUS | Status: DC | PRN

## 2019-02-04 MED ORDER — SODIUM CHLORIDE 0.9 % IV SOLN: 250.0000 mL | INTRAVENOUS | Status: DC | PRN

## 2019-02-04 MED ORDER — SODIUM CHLORIDE 0.9 % WEIGHT BASED INFUSION: 1.0000 mL/kg/h | INTRAVENOUS | Status: DC

## 2019-02-04 MED ORDER — SODIUM CHLORIDE 0.9% FLUSH
3.0000 mL | Freq: Two times a day (BID) | INTRAVENOUS | Status: DC
  Administered 2019-02-04: 3 mL via INTRAVENOUS

## 2019-02-04 NOTE — Progress Notes (Signed)
Contacted Cards fellow [facility] and inform of Trop >65, will continue to monitior

## 2019-02-04 NOTE — Progress Notes (Signed)
ANTICOAGULATION CONSULT NOTE  Pharmacy Consult for heparin Indication: chest pain/ACS and atrial fibrillation  Allergies  Allergen Reactions  . Kayexalate [Polystyrene] Other (See Comments)    Recommended to avoid by GI due to ischemic gastropathy    Patient Measurements: Height: 6\' 1"  (185.4 cm) Weight: 268 lb 15.4 oz (122 kg) IBW/kg (Calculated) : 79.9 Heparin Dosing Weight: 106kg  Vital Signs: Temp: 97.4 F (36.3 C) (03/15 0400) Temp Source: Oral (03/15 0400) BP: 133/90 (03/15 0800) Pulse Rate: 65 (03/15 0400)  Labs: Recent Labs    02/03/19 0740 02/03/19 0834 02/03/19 1416 02/03/19 2038 02/04/19 0445  HGB  --  9.3*  --   --  9.2*  HCT  --  28.7*  --   --  28.6*  PLT  --  136*  --   --  138*  APTT  --  37*  --   --   --   LABPROT  --  16.7*  --   --   --   INR  --  1.4*  --   --   --   HEPARINUNFRC  --   --   --  0.48 0.55  CREATININE  --  0.96 1.10  --  1.24  CKTOTAL 131  --   --   --   --   CKMB 2.8  --   --   --   --   TROPONINI  --  0.20* 12.24* >65.00*  --     Estimated Creatinine Clearance: 78 mL/min (by C-G formula based on SCr of 1.24 mg/dL).   Medical History: Past Medical History:  Diagnosis Date  . Anemia    low iron  . Arthritis    bilateral knees  . Atrial fibrillation, chronic   . Chronic diastolic CHF (congestive heart failure) (HCC) 06/15/2018  . History of subdural hematoma   . Hyperlipidemia   . Hypertension   . Morbid obesity (HCC)   . Normal coronary arteries    by cardiac catheterization performed 03/14/06  . Pre-diabetes    pt denies  . S/P TAVR (transcatheter aortic valve replacement)    a. 07/25/18: Edwards Sapien 3 THV (size 26 mm, model # 9600TFX, serial # [id number]) by Dr. Laneta Simmers and Dr. Clifton James  . Sleep apnea    on CPAP  . Venous insufficiency     Medications:  Apixaban prior to admission  Assessment: 68 year old male originally admitted to [facility] with bacteremia and cellulitis. Found to have bilateral CVAs and small  SAH. Also found blood and blood clot in stomach but unable to anticoagulate. Patient was later transferred to CIR on 3/10. Patient was on apixaban prior to admission, g/t GIB this was never restarted during this admission.   Continue IV heparin for r/o ACS and afib. Heparin level continue to be at goal this morning.   Goal of Therapy:  Heparin level 0.3-0.7 units/ml Monitor platelets by anticoagulation protocol: Yes   Plan:  -no heparin changes needed -Daily heparin level and CBC  Sheppard Coil PharmD., BCPS Clinical Pharmacist 02/04/2019 8:53 AM

## 2019-02-04 NOTE — Progress Notes (Signed)
Progress Note  Patient Name: Joel Rogers Date of Encounter: 02/04/2019  Primary Cardiologist:   Nanetta Batty, MD   Subjective   None of the chest pain that he had last night.  He does have chest soreness from compressions.  Breathing OK  Inpatient Medications    Scheduled Meds: . aspirin EC  81 mg Oral Daily  . Chlorhexidine Gluconate Cloth  6 each Topical Q0600  . mouth rinse  15 mL Mouth Rinse BID  . metoprolol tartrate  50 mg Oral BID  . pantoprazole (PROTONIX) IV  40 mg Intravenous Q12H   Continuous Infusions: . amiodarone    . heparin 1,400 Units/hr (02/04/19 0443)  . lidocaine 2 mg/min (02/04/19 0502)   PRN Meds: acetaminophen, nitroGLYCERIN, ondansetron (ZOFRAN) IV   Vital Signs    Vitals:   02/04/19 0300 02/04/19 0400 02/04/19 0500 02/04/19 0600  BP:  111/78 126/87 110/66  Pulse:  65    Resp: 19 (!) 25 15 12   Temp: (!) 97.4 F (36.3 C) (!) 97.4 F (36.3 C)    TempSrc: Oral Oral    SpO2: 100% 100% 100% 100%  Weight:      Height:        Intake/Output Summary (Last 24 hours) at 02/04/2019 0737 Last data filed at 02/04/2019 0400 Gross per 24 hour  Intake 700.11 ml  Output 300 ml  Net 400.11 ml   Filed Weights   02/03/19 0900 02/03/19 1600  Weight: 120.2 kg 122 kg    Telemetry    Atrial fib with controlled rate.  Brief runs of NSVT - Personally Reviewed  ECG    Atrial fib with RBBB, anterior ST depression has resolved.  ST elevation lead V1 resolved - Personally Reviewed  Physical Exam   GEN: No acute distress.   Neck: No  JVD Cardiac: Irregular RR, no murmurs, rubs, or gallops.  Respiratory: Clear  to auscultation bilaterally. GI: Soft, nontender, non-distended  MS:    Moderate right greater than left leg edema; No deformity. Neuro:    Right hemiparesis Psych: Normal affect   Labs    Chemistry Recent Labs  Lab 01/31/19 0612 02/03/19 0834 02/03/19 1416 02/04/19 0445  NA 137 136 136 136  K 3.5 3.0* 3.6 4.6  CL 108 109 109  109  CO2 26 20* 22 22  GLUCOSE 106* 140* 108* 109*  BUN 11 11 13 17   CREATININE 0.93 0.96 1.10 1.24  CALCIUM 8.2* 8.2* 8.1* 8.1*  PROT 5.6* 6.1*  --   --   ALBUMIN 1.7* 2.0*  --   --   AST 24 120*  --   --   ALT 6 39  --   --   ALKPHOS 79 98  --   --   BILITOT 1.0 1.3*  --   --   GFRNONAA >60 >60 >60 59*  GFRAA >60 >60 >60 >60  ANIONGAP 3* 7 5 5      Hematology Recent Labs  Lab 01/31/19 0612 02/03/19 0834 02/04/19 0445  WBC 14.8* 26.5* 15.6*  RBC 3.33* 3.50* 3.48*  HGB 8.8* 9.3* 9.2*  HCT 27.2* 28.7* 28.6*  MCV 81.7 82.0 82.2  MCH 26.4 26.6 26.4  MCHC 32.4 32.4 32.2  RDW 20.8* 20.7* 20.9*  PLT 130* 136* 138*    Cardiac Enzymes Recent Labs  Lab 02/03/19 0834 02/03/19 1416 02/03/19 2038  TROPONINI 0.20* 12.24* >65.00*   No results for input(s): TROPIPOC in the last 168 hours.   BNP Recent Labs  Lab 02/03/19 0834  BNP 408.1*     DDimer No results for input(s): DDIMER in the last 168 hours.   Radiology    Dg Chest Port 1 View  Result Date: 02/03/2019 CLINICAL DATA:  Ventricular tachycardia EXAM: PORTABLE CHEST 1 VIEW COMPARISON:  January 21, 2019. FINDINGS: Central catheter tip is in the left innominate vein. No pneumothorax. There is no evident edema or consolidation. There is generalized cardiomegaly with pulmonary vascularity normal. There is a prosthetic aortic valve. There is aortic atherosclerosis. No evident adenopathy. No bone lesions. IMPRESSION: Central catheter tip in left innominate vein. No pneumothorax. Generalized cardiomegaly. No edema or consolidation. Aortic Atherosclerosis (ICD10-I70.0). Electronically Signed   By: Bretta Bang III M.D.   On: 02/03/2019 14:28    Cardiac Studies   ECHO 01/15/19  1. The left ventricle has normal systolic function with an ejection fraction of 60-65%. The cavity size was normal. There is mildly increased left ventricular wall thickness. Left ventricular diastolic Doppler parameters are indeterminate. No  evidence  of left ventricular regional wall motion abnormalities.  2. The right ventricle has moderately reduced systolic function. The cavity was mildly enlarged. There is no increase in right ventricular wall thickness. Right ventricular systolic pressure is moderately elevated with an estimated pressure of 41.7  mmHg.  3. Left atrial size was severely dilated.  4. Right atrial size was severely dilated.  5. The mitral valve is normal in structure. There is mild mitral annular calcification present.  6. The tricuspid valve is normal in structure.  7. A 26 Edwards Sapien bioprosthetic aortic valve (TAVR) valve is present in the aortic position. Normal function observed with mean gradient 15.7 mmHg. No perivalvular leak.  8. The pulmonic valve was grossly normal. Pulmonic valve regurgitation is mild by color flow Doppler.  9. The aortic root is normal in size and structure. 10. The inferior vena cava was dilated in size with <50% respiratory variability. 11. No evidence of left ventricular regional wall motion abnormalities.  Patient Profile     68 y.o. male with of chronic atrial fib and is status post TAVR Sept 2019.  He is in the hospital in rehab following CVA last month with a probable cardioembolic source.  Of note his hospital course was complicated by UGI bleed requiring transfusions.  He had V fib arrest in rehab.  Cardioverted and now with increased NQWMI.   Assessment & Plan    NQWMI:   Pain free since the initial event.  Troponin elevated as above.  Awaiting official echo but detailed bedside echo per Dr.Agarwala showed overall preserved EF.  He is on heparin and ASA and beta blocker.  Possible embolic source of MI (known normal coronaries in Sept 2019.)  Possible cath tomorrow and possibly switch to Plavix.    VFIB/NSVT:  Secondary to above.  On Lidocaine overnight.  I will stop this and observe his rhythm.     ATRIAL FIB:  Rate is controlled.  Continue heparin and beta blocker.    For questions or updates, please contact [facility] Please consult [email or web address] for contact info under Cardiology/STEMI.   Signed, Rollene Rotunda, MD  02/04/2019, 7:37 AM

## 2019-02-04 NOTE — Progress Notes (Signed)
Consent signed for cardiac cath. Patient not wishing to move in bed much encouraged to do so.  Denies needing pain medication.  Attempted x 2 to pull up cardiac cath video , unavailable at this time. Will continue to pursue.

## 2019-02-04 NOTE — H&P (Addendum)
NAME:  Joel Rogers, MRN:  [id number], DOB:  March 15, 1951, LOS: 1 ADMISSION DATE:  02/03/2019, CONSULTATION DATE:  01/05/2019 REFERRING MD:  Sharen Heck - CIR, CHIEF COMPLAINT:  Post cardiac arrest   HPI/course in hospital  68 year old man who sustained a VF cardiac arrest on CIR.  He complained of acid-like burning retrosternal CP with no relief with Maalox.  Lost consciousness/pulse - CPR with defibrillation x3 prior to ROSC. Patient awake and communicative post arrest.  Had been admitted to CIR following admission for right hemiparesis and staphylococcal bacteremia from cellulitis.  MRI consistent with multiple cardiobembolic strokes. No vegetation seen on TEE, TAVR valve intact.  Developed CG emesis and hemodynamically significant UGIB requiring 6 units of blood. Eliquis and ASA held.  EGD: ischemic changes in gastric pouch. Anticoagulation placed on hold.  Improved to point he was transferred to CIR on 3/10. Was due to restart ASA and Eliquis on 3/16 while on rehab.  Past Medical History   Past Medical History:  Diagnosis Date  . Anemia    low iron  . Arthritis    bilateral knees  . Atrial fibrillation, chronic   . Chronic diastolic CHF (congestive heart failure) (HCC) 06/15/2018  . History of subdural hematoma   . Hyperlipidemia   . Hypertension   . Morbid obesity (HCC)   . Normal coronary arteries    by cardiac catheterization performed 03/14/06  . Pre-diabetes    pt denies  . S/P TAVR (transcatheter aortic valve replacement)    a. 07/25/18: Edwards Sapien 3 THV (size 26 mm, model # 9600TFX, serial # [id number]) by Dr. Laneta Simmers and Dr. Clifton James  . Sleep apnea    on CPAP  . Venous insufficiency      Past Surgical History:  Procedure Laterality Date  . BIOPSY  01/26/2019   Procedure: BIOPSY;  Surgeon: Bernette Redbird, MD;  Location: [facility] ENDOSCOPY;  Service: Endoscopy;;  . CRANIOTOMY Right 08/27/2016   Procedure: CRANIOTOMY HEMATOMA EVACUATION SUBDURAL;  Surgeon: Maeola Harman, MD;   Location: [facility] OR;  Service: Neurosurgery;  Laterality: Right;  . ESOPHAGOGASTRODUODENOSCOPY N/A 01/21/2019   Procedure: ESOPHAGOGASTRODUODENOSCOPY (EGD);  Surgeon: Vida Rigger, MD;  Location: [facility] ENDOSCOPY;  Service: Endoscopy;  Laterality: N/A;  . ESOPHAGOGASTRODUODENOSCOPY (EGD) WITH PROPOFOL N/A 01/22/2019   Procedure: ESOPHAGOGASTRODUODENOSCOPY (EGD) WITH PROPOFOL;  Surgeon: Bernette Redbird, MD;  Location: [facility] ENDOSCOPY;  Service: Endoscopy;  Laterality: N/A;  . ESOPHAGOGASTRODUODENOSCOPY (EGD) WITH PROPOFOL N/A 01/26/2019   Procedure: ESOPHAGOGASTRODUODENOSCOPY (EGD) WITH PROPOFOL;  Surgeon: Bernette Redbird, MD;  Location: [facility] ENDOSCOPY;  Service: Endoscopy;  Laterality: N/A;  . GASTRIC BYPASS  09/2008  . JOINT REPLACEMENT  08/31/2006   right hip  . MULTIPLE EXTRACTIONS WITH ALVEOLOPLASTY N/A 07/13/2018   Procedure: Extraction of tooth #'s 3,8,10, 23-26, 30and 32 with alveoloplasty and gross debridement of remaining teeth;  Surgeon: Charlynne Pander, DDS;  Location: [facility] OR;  Service: Oral Surgery;  Laterality: N/A;  . RIGHT HEART CATH N/A 07/06/2018   Procedure: RIGHT HEART CATH;  Surgeon: Kathleene Hazel, MD;  Location: [facility] INVASIVE CV LAB;  Service: Cardiovascular;  Laterality: N/A;  . RIGHT/LEFT HEART CATH AND CORONARY ANGIOGRAPHY N/A 07/10/2018   Procedure: RIGHT/LEFT HEART CATH AND CORONARY ANGIOGRAPHY;  Surgeon: Dolores Patty, MD;  Location: [facility] INVASIVE CV LAB;  Service: Cardiovascular;  Laterality: N/A;  . TEE WITHOUT CARDIOVERSION N/A 07/25/2018   Procedure: TRANSESOPHAGEAL ECHOCARDIOGRAM (TEE);  Surgeon: Kathleene Hazel, MD;  Location: [facility] OR;  Service: Open Heart Surgery;  Laterality: N/A;  .  TEE WITHOUT CARDIOVERSION N/A 01/18/2019   Procedure: TRANSESOPHAGEAL ECHOCARDIOGRAM (TEE) WITH PROPOFOL;  Surgeon: Jonelle SidleMcDowell, Samuel G, MD;  Location: [facility] ORS;  Service: Cardiovascular;  Laterality: N/A;  . TOTAL HIP ARTHROPLASTY     right hip  . TRANSCATHETER AORTIC VALVE REPLACEMENT,  TRANSFEMORAL  07/25/2018  . TRANSCATHETER AORTIC VALVE REPLACEMENT, TRANSFEMORAL N/A 07/25/2018   Procedure: TRANSCATHETER AORTIC VALVE REPLACEMENT, TRANSFEMORAL;  Surgeon: Kathleene HazelMcAlhany, Christopher D, MD;  Location: [facility] OR;  Service: Open Heart Surgery;  Laterality: N/A;      Interim history/subjective:  No further anginal chest pain.  He is not short of breath but does complain of chest pain on palpation most notably on the sides likely related to CPR.  Objective   Blood pressure 98/76, pulse 65, temperature (!) 97.2 F (36.2 C), temperature source Oral, resp. rate 14, height 6\' 1"  (1.854 m), weight 122 kg, SpO2 100 %.        Intake/Output Summary (Last 24 hours) at 02/04/2019 1352 Last data filed at 02/04/2019 1300 Gross per 24 hour  Intake 1290.44 ml  Output 500 ml  Net 790.44 ml   Filed Weights   02/03/19 0900 02/03/19 1600  Weight: 120.2 kg 122 kg    Examination: Physical Exam  Constitutional: He is oriented to person, place, and time.  obese  HENT:  Head: Normocephalic and atraumatic.  Eyes: Pupils are equal, round, and reactive to light.  Neck: No JVD present.  Cardiovascular: Normal rate, S1 normal, S2 normal and normal pulses. An irregularly irregular rhythm present. PMI is displaced. Exam reveals no S3 and no S4.  Murmur heard.  Crescendo decrescendo systolic (aortic distribution) murmur is present with a grade of 3/6. Genitourinary:    Genitourinary Comments: Condom catheter   Musculoskeletal:        General: Edema (bilateral 3+ leg edema.) present.  Neurological: He is alert and oriented to person, place, and time.  Mild right hemiparesis.  Skin: Skin is warm and dry.     Ancillary tests (personally reviewed)  CBC: Recent Labs  Lab 01/29/19 0503 01/30/19 0830 01/31/19 0612 02/03/19 0834 02/04/19 0445  WBC 14.4* 16.7* 14.8* 26.5* 15.6*  NEUTROABS  --  14.0* 12.2*  --   --   HGB 9.4* 9.3* 8.8* 9.3* 9.2*  HCT 29.1* 29.1* 27.2* 28.7* 28.6*  MCV 82.7 81.5  81.7 82.0 82.2  PLT 125* 121* 130* 136* 138*    Basic Metabolic Panel: Recent Labs  Lab 01/30/19 0830 01/31/19 0612 02/03/19 0834 02/03/19 1416 02/04/19 0445  NA 135 137 136 136 136  K 3.4* 3.5 3.0* 3.6 4.6  CL 108 108 109 109 109  CO2 24 26 20* 22 22  GLUCOSE 130* 106* 140* 108* 109*  BUN 10 11 11 13 17   CREATININE 1.01 0.93 0.96 1.10 1.24  CALCIUM 8.1* 8.2* 8.2* 8.1* 8.1*  MG  --   --  1.7 1.9  --    GFR: Estimated Creatinine Clearance: 78 mL/min (by C-G formula based on SCr of 1.24 mg/dL). Recent Labs  Lab 01/30/19 0830 01/31/19 0612 02/03/19 0834 02/03/19 0852 02/03/19 1416 02/04/19 0445  WBC 16.7* 14.8* 26.5*  --   --  15.6*  LATICACIDVEN  --   --   --  2.2* 1.0  --     Liver Function Tests: Recent Labs  Lab 01/31/19 0612 02/03/19 0834  AST 24 120*  ALT 6 39  ALKPHOS 79 98  BILITOT 1.0 1.3*  PROT 5.6* 6.1*  ALBUMIN 1.7* 2.0*  No results for input(s): LIPASE, AMYLASE in the last 168 hours. No results for input(s): AMMONIA in the last 168 hours.  ABG    Component Value Date/Time   PHART 7.362 07/25/2018 0804   PCO2ART 45.6 07/25/2018 0804   PO2ART 333.0 (H) 07/25/2018 0804   HCO3 22.7 01/13/2019 1550   TCO2 25 07/25/2018 1018   ACIDBASEDEF 2.2 (H) 01/13/2019 1550   O2SAT 74.9 01/13/2019 1550     Coagulation Profile: Recent Labs  Lab 02/03/19 0834  INR 1.4*    Cardiac Enzymes: Recent Labs  Lab 02/03/19 0740 02/03/19 0834 02/03/19 1416 02/03/19 2038  CKTOTAL 131  --   --   --   CKMB 2.8  --   --   --   TROPONINI  --  0.20* 12.24* >65.00*    HbA1C: Hemoglobin A1C  Date/Time Value Ref Range Status  01/22/2016 6.2  Final   Hgb A1c MFr Bld  Date/Time Value Ref Range Status  07/21/2018 10:20 AM 5.8 (H) 4.8 - 5.6 % Final    Comment:    (NOTE) Pre diabetes:          5.7%-6.4% Diabetes:              >6.4% Glycemic control for   <7.0% adults with diabetes   11/26/2016 02:06 PM 5.6 <5.7 % Final    Comment:      For the  purpose of screening for the presence of diabetes:   <5.7%       Consistent with the absence of diabetes 5.7-6.4 %   Consistent with increased risk for diabetes (prediabetes) >=6.5 %     Consistent with diabetes   This assay result is consistent with a decreased risk of diabetes.   Currently, no consensus exists regarding use of hemoglobin A1c for diagnosis of diabetes in children.   According to American Diabetes Association (ADA) guidelines, hemoglobin A1c <7.0% represents optimal control in non-pregnant diabetic patients. Different metrics may apply to specific patient populations. Standards of Medical Care in Diabetes (ADA).       CBG: No results for input(s): GLUCAP in the last 168 hours.  EKG: atrial fibrillation with right bundle branch block.- personally reviewed.      TTE 2/24: EF 65% with normal Edwards Sapiens valve.  RV moderately reduced. RA/LA enlarged.     Assessment & Plan:  Critically ill status post VF cardiac arrest. At high risk for recurrent arrest. Chest pain at rest high suspicious for ACS.  Has ruled in for ACS with elevated troponin. Suspect embolic event from TAVR. Additional chest pain on palpation from prior CPR Residual right weakness from CVA Recent UGIB  Plan:  Will treat as ACS for now - ASA/iv heparin Echocardiogram Cardiac catheterization planned for tomorrow Weaning IV lidocaine off and  continuing beta-blocker ICU with cardiac monitoring Will resume IV PPI to mitigate any bleeding on resumption of anticoagulation. Encourage ambulation Tramadol for musculoskeletal chest pain.   Best practice:  Diet: Diet as tolerated.  N.p.o. after midnight for catheterization Pain/Anxiety/Delirium protocol (if indicated): prn only VAP protocol (if indicated): not applicable DVT prophylaxis: therapeutic IV heparin GI prophylaxis: IV PPI Urinary catheter: Condom catheter only. Glucose control: Phase 1 protocol Mobility: BR with WC priv.  Code Status: Full Family Communication: wife updated at bedside. Disposition: ICU.  Transfer to cardiac stepdown following catheterization tomorrow  Critical care time: N/A   Lynnell Catalan, MD [facility] ICU Physician [facility] Critical Care  Pager: [phone] Mobile: [phone] After hours: [phone].  02/04/2019, 1:52 PM      \

## 2019-02-04 NOTE — Plan of Care (Signed)
Post arrest day 2 post MI  left heart cath scheduled for tomorrow. Consent signed. Weaned o2 down, no chest pain. On heparin. Lidocaine off. Heart rate stable on lopressor in 50's afib.  Will need PT/ OT consult potentially rehab as he is deconditioned.

## 2019-02-05 ENCOUNTER — Inpatient Hospital Stay (HOSPITAL_COMMUNITY): Payer: Self-pay | Admitting: Occupational Therapy

## 2019-02-05 ENCOUNTER — Encounter (HOSPITAL_COMMUNITY)
Admission: AD | Disposition: A | Discharge status: Rehabilitation Facility | Payer: Self-pay | Source: Ambulatory Visit | Attending: Cardiovascular Disease

## 2019-02-05 ENCOUNTER — Inpatient Hospital Stay (HOSPITAL_COMMUNITY): Payer: Self-pay | Admitting: Physical Therapy

## 2019-02-05 ENCOUNTER — Inpatient Hospital Stay (HOSPITAL_COMMUNITY): Payer: Medicare Other

## 2019-02-05 DIAGNOSIS — I472 Ventricular tachycardia, unspecified: Secondary | ICD-10-CM

## 2019-02-05 DIAGNOSIS — I214 Non-ST elevation (NSTEMI) myocardial infarction: Principal | ICD-10-CM

## 2019-02-05 DIAGNOSIS — I361 Nonrheumatic tricuspid (valve) insufficiency: Secondary | ICD-10-CM

## 2019-02-05 DIAGNOSIS — I371 Nonrheumatic pulmonary valve insufficiency: Secondary | ICD-10-CM

## 2019-02-05 DIAGNOSIS — I4821 Permanent atrial fibrillation: Secondary | ICD-10-CM

## 2019-02-05 HISTORY — PX: LEFT HEART CATH AND CORONARY ANGIOGRAPHY: CATH118249

## 2019-02-05 LAB — CBC
HCT: 22.5 % — ABNORMAL LOW (ref 39.0–52.0)
HCT: 24.9 % — ABNORMAL LOW (ref 39.0–52.0)
HCT: 24.3 % — ABNORMAL LOW (ref 39.0–52.0)
HCT: 21.7 % — ABNORMAL LOW (ref 39.0–52.0)
Hemoglobin: 7.2 g/dL — ABNORMAL LOW (ref 13.0–17.0)
Hemoglobin: 8.1 g/dL — ABNORMAL LOW (ref 13.0–17.0)
Hemoglobin: 7.8 g/dL — ABNORMAL LOW (ref 13.0–17.0)
Hemoglobin: 7.2 g/dL — ABNORMAL LOW (ref 13.0–17.0)
MCH: 26.7 pg (ref 26.0–34.0)
MCH: 26.9 pg (ref 26.0–34.0)
MCH: 26.4 pg (ref 26.0–34.0)
MCH: 27.6 pg (ref 26.0–34.0)
MCHC: 32 g/dL (ref 30.0–36.0)
MCHC: 32.5 g/dL (ref 30.0–36.0)
MCHC: 32.1 g/dL (ref 30.0–36.0)
MCHC: 33.2 g/dL (ref 30.0–36.0)
MCV: 83.3 fL (ref 80.0–100.0)
MCV: 82.7 fL (ref 80.0–100.0)
MCV: 82.4 fL (ref 80.0–100.0)
MCV: 83.1 fL (ref 80.0–100.0)
NRBC: 0 % (ref 0.0–0.2)
PLATELETS: 142 10*3/uL — AB (ref 150–400)
PLATELETS: 155 10*3/uL (ref 150–400)
PLATELETS: 145 10*3/uL — AB (ref 150–400)
Platelets: 154 10*3/uL (ref 150–400)
RBC: 2.7 MIL/uL — ABNORMAL LOW (ref 4.22–5.81)
RBC: 3.01 MIL/uL — ABNORMAL LOW (ref 4.22–5.81)
RBC: 2.95 MIL/uL — ABNORMAL LOW (ref 4.22–5.81)
RBC: 2.61 MIL/uL — ABNORMAL LOW (ref 4.22–5.81)
RDW: 21 % — ABNORMAL HIGH (ref 11.5–15.5)
RDW: 19.3 % — ABNORMAL HIGH (ref 11.5–15.5)
RDW: 19.2 % — ABNORMAL HIGH (ref 11.5–15.5)
RDW: 19.4 % — ABNORMAL HIGH (ref 11.5–15.5)
WBC: 16.3 10*3/uL — ABNORMAL HIGH (ref 4.0–10.5)
WBC: 16.1 10*3/uL — ABNORMAL HIGH (ref 4.0–10.5)
WBC: 17 10*3/uL — ABNORMAL HIGH (ref 4.0–10.5)
WBC: 15 10*3/uL — ABNORMAL HIGH (ref 4.0–10.5)
nRBC: 0 % (ref 0.0–0.2)
nRBC: 0 % (ref 0.0–0.2)
nRBC: 0 % (ref 0.0–0.2)

## 2019-02-05 LAB — BASIC METABOLIC PANEL
Anion gap: 3 — ABNORMAL LOW (ref 5–15)
BUN: 24 mg/dL — ABNORMAL HIGH (ref 8–23)
CALCIUM: 7.5 mg/dL — AB (ref 8.9–10.3)
CO2: 23 mmol/L (ref 22–32)
Chloride: 110 mmol/L (ref 98–111)
Creatinine, Ser: 1.34 mg/dL — ABNORMAL HIGH (ref 0.61–1.24)
GFR calc Af Amer: >60 mL/min (ref >60)
GFR, EST NON AFRICAN AMERICAN: 54 mL/min — AB (ref >60)
GLUCOSE: 98 mg/dL (ref 70–99)
Potassium: 5 mmol/L (ref 3.5–5.1)
SODIUM: 136 mmol/L (ref 135–145)

## 2019-02-05 LAB — HEPARIN LEVEL (UNFRACTIONATED): Heparin Unfractionated: 0.38 IU/mL (ref 0.30–0.70)

## 2019-02-05 LAB — ECHOCARDIOGRAM COMPLETE
Height: 73 in
Weight: 4384.51 oz

## 2019-02-05 LAB — PREPARE RBC (CROSSMATCH)

## 2019-02-05 SURGERY — LEFT HEART CATH AND CORONARY ANGIOGRAPHY
Anesthesia: LOCAL

## 2019-02-05 MED ORDER — FENTANYL CITRATE (PF) 100 MCG/2ML IJ SOLN
INTRAMUSCULAR | Status: AC
  Filled 2019-02-05: qty 2

## 2019-02-05 MED ORDER — FUROSEMIDE 10 MG/ML IJ SOLN
40.0000 mg | Freq: Once | INTRAMUSCULAR | Status: AC
  Administered 2019-02-05: 40 mg via INTRAVENOUS
  Filled 2019-02-05: qty 4

## 2019-02-05 MED ORDER — SODIUM CHLORIDE 0.9 % IV SOLN
INTRAVENOUS | Status: AC
  Administered 2019-02-05: 17:00:00 via INTRAVENOUS

## 2019-02-05 MED ORDER — HEPARIN SODIUM (PORCINE) 1000 UNIT/ML IJ SOLN
INTRAMUSCULAR | Status: AC
  Filled 2019-02-05: qty 1

## 2019-02-05 MED ORDER — VERAPAMIL HCL 2.5 MG/ML IV SOLN
INTRAVENOUS | Status: AC
  Filled 2019-02-05: qty 2

## 2019-02-05 MED ORDER — MIDAZOLAM HCL 2 MG/2ML IJ SOLN
INTRAMUSCULAR | Status: DC | PRN
  Administered 2019-02-05: 1 mg via INTRAVENOUS

## 2019-02-05 MED ORDER — IOHEXOL 350 MG/ML SOLN
INTRAVENOUS | Status: DC | PRN
  Administered 2019-02-05: 50 mL via INTRA_ARTERIAL

## 2019-02-05 MED ORDER — HEPARIN (PORCINE) IN NACL 1000-0.9 UT/500ML-% IV SOLN
INTRAVENOUS | Status: DC | PRN
  Administered 2019-02-05 (×2): 500 mL

## 2019-02-05 MED ORDER — SODIUM CHLORIDE 0.9% FLUSH: 3.0000 mL | INTRAVENOUS | Status: DC | PRN

## 2019-02-05 MED ORDER — SODIUM CHLORIDE 0.9% FLUSH
3.0000 mL | Freq: Two times a day (BID) | INTRAVENOUS | Status: DC
  Administered 2019-02-05: 3 mL via INTRAVENOUS

## 2019-02-05 MED ORDER — SODIUM CHLORIDE 0.9% IV SOLUTION
Freq: Once | INTRAVENOUS | Status: AC
  Administered 2019-02-06: via INTRAVENOUS

## 2019-02-05 MED ORDER — LIDOCAINE HCL (PF) 1 % IJ SOLN
INTRAMUSCULAR | Status: DC | PRN
  Administered 2019-02-05: 2 mL

## 2019-02-05 MED ORDER — LIDOCAINE HCL (PF) 1 % IJ SOLN
INTRAMUSCULAR | Status: AC
  Filled 2019-02-05: qty 30

## 2019-02-05 MED ORDER — SODIUM CHLORIDE 0.9% IV SOLUTION: Freq: Once | INTRAVENOUS | Status: DC

## 2019-02-05 MED ORDER — ONDANSETRON HCL 4 MG/2ML IJ SOLN
4.0000 mg | Freq: Four times a day (QID) | INTRAMUSCULAR | Status: DC | PRN
  Administered 2019-02-05: 4 mg via INTRAVENOUS
  Filled 2019-02-05: qty 2

## 2019-02-05 MED ORDER — ACETAMINOPHEN 325 MG PO TABS: 650.0000 mg | ORAL_TABLET | ORAL | Status: DC | PRN

## 2019-02-05 MED ORDER — SODIUM CHLORIDE 0.9 % IV SOLN
250.0000 mL | INTRAVENOUS | Status: DC | PRN
  Administered 2019-02-07: 250 mL via INTRAVENOUS

## 2019-02-05 MED ORDER — MIDAZOLAM HCL 2 MG/2ML IJ SOLN
INTRAMUSCULAR | Status: AC
  Filled 2019-02-05: qty 2

## 2019-02-05 MED ORDER — HEPARIN (PORCINE) IN NACL 1000-0.9 UT/500ML-% IV SOLN
INTRAVENOUS | Status: AC
  Filled 2019-02-05: qty 1000

## 2019-02-05 MED ORDER — VERAPAMIL HCL 2.5 MG/ML IV SOLN
INTRAVENOUS | Status: DC | PRN
  Administered 2019-02-05: 10 mL via INTRA_ARTERIAL

## 2019-02-05 MED ORDER — FENTANYL CITRATE (PF) 100 MCG/2ML IJ SOLN
INTRAMUSCULAR | Status: DC | PRN
  Administered 2019-02-05: 25 ug via INTRAVENOUS

## 2019-02-05 MED ORDER — HEPARIN SODIUM (PORCINE) 1000 UNIT/ML IJ SOLN
INTRAMUSCULAR | Status: DC | PRN
  Administered 2019-02-05: 5000 [IU] via INTRAVENOUS

## 2019-02-05 MED ORDER — HEPARIN (PORCINE) 25000 UT/250ML-% IV SOLN: 1400.0000 [IU]/h | INTRAVENOUS | Status: DC

## 2019-02-05 SURGICAL SUPPLY — 10 items
CATH 5FR JL3.5 JR4 ANG PIG MP (CATHETERS) ×2 IMPLANT
DEVICE RAD COMP TR BAND LRG (VASCULAR PRODUCTS) ×2 IMPLANT
GLIDESHEATH SLEND SS 6F .021 (SHEATH) ×2 IMPLANT
GUIDEWIRE INQWIRE 1.5J.035X260 (WIRE) ×1 IMPLANT
INQWIRE 1.5J .035X260CM (WIRE) ×2
KIT HEART LEFT (KITS) ×2 IMPLANT
PACK CARDIAC CATHETERIZATION (CUSTOM PROCEDURE TRAY) ×2 IMPLANT
SHEATH PROBE COVER 6X72 (BAG) ×2 IMPLANT
TRANSDUCER W/STOPCOCK (MISCELLANEOUS) ×2 IMPLANT
TUBING CIL FLEX 10 FLL-RA (TUBING) ×2 IMPLANT

## 2019-02-05 NOTE — Progress Notes (Addendum)
ANTICOAGULATION CONSULT NOTE  Pharmacy Consult for heparin Indication: chest pain/ACS and atrial fibrillation  Allergies  Allergen Reactions  . Kayexalate [Polystyrene] Other (See Comments)    Recommended to avoid by GI due to ischemic gastropathy    Patient Measurements: Height: 6\' 1"  (185.4 cm) Weight: 274 lb 0.5 oz (124.3 kg) IBW/kg (Calculated) : 79.9 Heparin Dosing Weight: 106kg  Vital Signs: Temp: 97.7 F (36.5 C) (03/16 1400) Temp Source: Oral (03/16 1400) BP: 136/89 (03/16 1625) Pulse Rate: 88 (03/16 1625)  Labs: Recent Labs    02/03/19 0740  02/03/19 0834 02/03/19 1416 02/03/19 2038 02/04/19 0445 02/05/19 0505 02/05/19 0506 02/05/19 1440  HGB  --    < > 9.3*  --   --  9.2*  --  7.2* 8.1*  HCT  --    < > 28.7*  --   --  28.6*  --  22.5* 24.9*  PLT  --    < > 136*  --   --  138*  --  142* 155  APTT  --   --  37*  --   --   --   --   --   --   LABPROT  --   --  16.7*  --   --   --   --   --   --   INR  --   --  1.4*  --   --   --   --   --   --   HEPARINUNFRC  --   --   --   --  0.48 0.55 0.38  --   --   CREATININE  --    < > 0.96 1.10  --  1.24  --  1.34*  --   CKTOTAL 131  --   --   --   --   --   --   --   --   CKMB 2.8  --   --   --   --   --   --   --   --   TROPONINI  --   --  0.20* 12.24* >65.00*  --   --   --   --    < > = values in this interval not displayed.    Estimated Creatinine Clearance: 72.9 mL/min (A) (by C-G formula based on SCr of 1.34 mg/dL (H)).   Medical History: Past Medical History:  Diagnosis Date  . Anemia    low iron  . Arthritis    bilateral knees  . Atrial fibrillation, chronic   . Chronic diastolic CHF (congestive heart failure) (HCC) 06/15/2018  . History of subdural hematoma   . Hyperlipidemia   . Hypertension   . Morbid obesity (HCC)   . Normal coronary arteries    by cardiac catheterization performed 03/14/06  . Pre-diabetes    pt denies  . S/P TAVR (transcatheter aortic valve replacement)    a. 07/25/18:  Edwards Sapien 3 THV (size 26 mm, model # 9600TFX, serial # [id number]) by Dr. Laneta Simmers and Dr. Clifton James  . Sleep apnea    on CPAP  . Venous insufficiency     Medications:  Apixaban prior to admission  Assessment: 68 year old male originally admitted to [facility] with bacteremia and cellulitis. Found to have bilateral CVAs and small SAH. Also found blood and blood clot in stomach but unable to anticoagulate. Patient was later transferred to CIR on 3/10. Patient was on apixaban prior to admission, d/t  GIB this was never restarted during this admission.   Pt now on heparin for ACS rule out and AFib. Underwent cardiac cath on 3/16 finding ramus lesion of 99% stenosed with heavy thrombus. Plan to restart heparin 6 hr after post sheath removal (documented on 3/16@1625 ) with eventual plan for Eliquis in AM.   H/H down to 7.2, no S/Sx bleeding noted. Pltc remains stable.  Goal of Therapy:  Heparin level 0.3-0.7 units/ml Monitor platelets by anticoagulation protocol: Yes   Plan:  -Restart heparin infusion at 1400 units/hr on 3/16@2230  -Daily heparin level and CBC - monitor heparin level with AM labs  -F/u plan to restart apixaban  Sherron Monday, PharmD, BCCCP Clinical Pharmacist  Pager: [phone] Phone: [phone] Please check AMION for all [facility] Pharmacy numbers 02/05/2019

## 2019-02-05 NOTE — Progress Notes (Signed)
 Progress Note  Patient Name: Joel Rogers Date of Encounter: 02/05/2019  Primary Cardiologist: Jonathan Berry, MD   Subjective   C/o chest soreness related to CPR. No shortness of breath.  Inpatient Medications    Scheduled Meds: . sodium chloride   Intravenous Once  . aspirin EC  81 mg Oral Daily  . Chlorhexidine Gluconate Cloth  6 each Topical Q0600  . mouth rinse  15 mL Mouth Rinse BID  . metoprolol tartrate  50 mg Oral BID  . pantoprazole (PROTONIX) IV  40 mg Intravenous Q12H  . sodium chloride flush  3 mL Intravenous Q12H   Continuous Infusions: . sodium chloride    . sodium chloride 75 mL/hr at 02/05/19 0152  . sodium chloride    . heparin 1,400 Units/hr (02/05/19 0400)   PRN Meds: sodium chloride, acetaminophen, nitroGLYCERIN, ondansetron (ZOFRAN) IV, sodium chloride flush, traMADol-acetaminophen   Vital Signs    Vitals:   02/05/19 0400 02/05/19 0500 02/05/19 0600 02/05/19 0749  BP: 104/77 98/67 107/90   Pulse: 67     Resp: 15 13 18   Temp: 98.5 F (36.9 C)   97.9 F (36.6 C)  TempSrc: Oral   Oral  SpO2: 100% 100% 100%   Weight:  124.3 kg    Height:        Intake/Output Summary (Last 24 hours) at 02/05/2019 0834 Last data filed at 02/05/2019 0600 Gross per 24 hour  Intake 520.16 ml  Output 700 ml  Net -179.84 ml   Last 3 Weights 02/05/2019 02/04/2019 02/03/2019  Weight (lbs) 274 lb 0.5 oz 266 lb 5.1 oz 268 lb 15.4 oz  Weight (kg) 124.3 kg 120.8 kg 122 kg      Telemetry    Normal sinus rhythm. Short runs of NSVT noted  - Personally Reviewed  ECG    Sinus rhythm with right bundle branch block- Personally Reviewed  Physical Exam  Alert, oriented male in NAD GEN: No acute distress.   Neck: Mild JVD Cardiac: RRR, 1/6 SEM at the RUSB Respiratory: Clear to auscultation bilaterally. GI: Soft, nontender, non-distended  MS: diffuse 2+ edema; No deformity. Neuro:  Nonfocal  Psych: Normal affect   Labs    Chemistry Recent Labs  Lab  01/31/19 0612 02/03/19 0834 02/03/19 1416 02/04/19 0445 02/05/19 0506  NA 137 136 136 136 136  K 3.5 3.0* 3.6 4.6 5.0  CL 108 109 109 109 110  CO2 26 20* 22 22 23  GLUCOSE 106* 140* 108* 109* 98  BUN 11 11 13 17 24*  CREATININE 0.93 0.96 1.10 1.24 1.34*  CALCIUM 8.2* 8.2* 8.1* 8.1* 7.5*  PROT 5.6* 6.1*  --   --   --   ALBUMIN 1.7* 2.0*  --   --   --   AST 24 120*  --   --   --   ALT 6 39  --   --   --   ALKPHOS 79 98  --   --   --   BILITOT 1.0 1.3*  --   --   --   GFRNONAA >60 >60 >60 59* 54*  GFRAA >60 >60 >60 >60 >60  ANIONGAP 3* 7 5 5 3*     Hematology Recent Labs  Lab 02/03/19 0834 02/04/19 0445 02/05/19 0506  WBC 26.5* 15.6* 16.3*  RBC 3.50* 3.48* 2.70*  HGB 9.3* 9.2* 7.2*  HCT 28.7* 28.6* 22.5*  MCV 82.0 82.2 83.3  MCH 26.6 26.4 26.7  MCHC 32.4 32.2   32.0  RDW 20.7* 20.9* 21.0*  PLT 136* 138* 142*    Cardiac Enzymes Recent Labs  Lab 02/03/19 0834 02/03/19 1416 02/03/19 2038  TROPONINI 0.20* 12.24* >65.00*   No results for input(s): TROPIPOC in the last 168 hours.   BNP Recent Labs  Lab 02/03/19 0834  BNP 408.1*     DDimer No results for input(s): DDIMER in the last 168 hours.   Radiology    Dg Chest Port 1 View  Result Date: 02/03/2019 CLINICAL DATA:  Ventricular tachycardia EXAM: PORTABLE CHEST 1 VIEW COMPARISON:  January 21, 2019. FINDINGS: Central catheter tip is in the left innominate vein. No pneumothorax. There is no evident edema or consolidation. There is generalized cardiomegaly with pulmonary vascularity normal. There is a prosthetic aortic valve. There is aortic atherosclerosis. No evident adenopathy. No bone lesions. IMPRESSION: Central catheter tip in left innominate vein. No pneumothorax. Generalized cardiomegaly. No edema or consolidation. Aortic Atherosclerosis (ICD10-I70.0). Electronically Signed   By: Bretta Bang III M.D.   On: 02/03/2019 14:28    Cardiac Studies   TEE 01-18-2019: IMPRESSIONS    1. The left ventricle  has normal systolic function, with an ejection fraction of 60-65%. No evidence of left ventricular regional wall motion abnormalities.  2. No evidence of left ventricular regional wall motion abnormalities.  3. Left atrial size was severely dilated. No left atrial appendage thrombus noted.  4. Right atrial size was severely dilated. No right atrial appendage thrombus noted.  5. The mitral valve is normal in structure. There is mild to moderate mitral annular calcification present.  6. The tricuspid valve was normal in structure. Tricuspid valve regurgitation is mild-moderate.  7. A 26 Edwards Sapien bioprosthetic aortic valve (TAVR) valve is present in the aortic position.  8. The right ventricle has moderately reduced systolic function. The cavity was mildly enlarged.  9. There is evidence of mild plaque in the descending aorta. 10. No valvular vegetations are identified.  FINDINGS  Left Ventricle: The left ventricle has normal systolic function, with an ejection fraction of 60-65%. No evidence of left ventricular regional wall motion abnormalities. Right Ventricle: The right ventricle has moderately reduced systolic function. The cavity was mildly enlarged. Left Atrium: Left atrial size was severely dilated. There is continuous echo contrast seen in the left atrial cavity and left atrial appendage. Right Atrium: Right atrial size was severely dilated. Right atrial pressure is estimated at 10 mmHg. Interatrial Septum: No atrial level shunt detected by color flow Doppler. Mitral Valve: The mitral valve is normal in structure. There is mild to moderate mitral annular calcification present. Mitral valve regurgitation is mild by color flow Doppler. Tricuspid Valve: The tricuspid valve was normal in structure. Tricuspid valve regurgitation is mild-moderate by color flow Doppler. Aortic Valve: The aortic valve has been repaired/replaced Aortic valve regurgitation was not visualized by color flow  Doppler. A 26 Edwards Sapien bioprosthetic, stented aortic valve (TAVR) valve is present in the aortic position. Pulmonic Valve: The pulmonic valve was grossly normal. Pulmonic valve regurgitation is trivial by color flow Doppler. Aorta: There is evidence of plaque in the descending aorta.   RIGHT ATRIUM RA Pressure: 10 mmHg  Cardiac Cath 07-10-2018: Findings:  Ao = 118/83 (104) LV = 207/30 RA = 17 RV = 87/13 PA = 91/34 (54) PCW = 28 Fick cardiac output/index = 10.3/4.4 PVR = 2.5 WU FA sat = 99% PA sat = 78%, 79%  Aortic valve: Mean gradient 67 mmHG  Peak gradient  AVA 1.06  cm2  Assessment:  1. Normal coronaries 2. Normal LV function EF 65% 3. Severe aortic stenosis 4. Severe pulmonary venous HTN with high-output state  Plan/Discussion:  Continue diuresis. Continue w/u for TAVR.   Patient Profile     68 y.o. male with history of severe aortic stenosis and TAVR approximately 6 months ago.  He presents now with large non-STEMI troponin greater than 65 and V. fib cardiac arrest.  This occurred after recent cardioembolic stroke without clear source of embolism by TEE evaluation.  Assessment & Plan    1.  Non-STEMI: normal cors pre-TAVR cath. Suspect embolic source of MI. Also possible that TAVR valve has caused coronary obstruction but think this is less likely - reviewed pre-TAVR CTA and coronary heights/sinus size adequate. I've recommended cardiac cath for diagnostic purposes. I have reviewed the risks, indications, and alternatives to cardiac catheterization, possible angioplasty, and stenting with the patient. Risks include but are not limited to bleeding, infection, vascular injury, stroke, myocardial infection, arrhythmia, kidney injury, radiation-related injury in the case of prolonged fluoroscopy use, emergency cardiac surgery, and death. The patient understands the risks of serious complication is 1-2 in 1000 with diagnostic cardiac cath and 1-2% or less  with angioplasty/stenting.   2.  Ventricular fibrillation arrest/nonsustained VT: Likely ischemic mediated - IV lidocaine now discontinued. Short runs of NSVT but no further sustained arrhythmia.   3.  Permanent atrial fibrillation with marked biatrial enlargement: currently on IV heparin. Will transition back to NOAC alone when stable. Concerned about 2G HgB drop overnight. Transfuse 1 u PRBC's today. Follow H/H. Involve GI if recurrent issues. Needs diagnostic cath prior to further GI workup in my opinion. Plan cath today after repeat H/H done post-transfusion.       For questions or updates, please contact [facility] Please consult [email or web address] for contact info under        Signed, Tonny Bollman, MD  02/05/2019, 8:34 AM

## 2019-02-05 NOTE — Progress Notes (Signed)
1 Unit of PRBCs started. Patient diuresing well from lasix. Ted hose knee high on legs.

## 2019-02-05 NOTE — Progress Notes (Signed)
IP rehab admissions - Patient known to me from recent CIR admit.  I spoke with patient and his wife today.  Noted patient pending cardiac catheterization. I will follow for progress.  Call me for questions.  [phone]

## 2019-02-05 NOTE — Progress Notes (Signed)
Pt's Hgb dropped from 8.1 to 7.8 after 1 unit PRBCs this morning & cath lab visit. Pt returned from cath lab at roughly 1430 & had 1 incontinent dark red BM. No BM's since then & no other bleeding noted. R radial site level 0 w/out complications.  Paged Cardiology MD to double check if heparin gtt needs to be started @2230  as planned. Waiting for response.  Pt alert & comfortable w/ VSS.

## 2019-02-05 NOTE — Progress Notes (Signed)
  Echocardiogram 2D Echocardiogram has been performed.  Celene Skeen 02/05/2019, 10:43 AM

## 2019-02-05 NOTE — Progress Notes (Signed)
Verbal order to cancel 1 unit PRBC's from Dr. Mayford Knife & continue w/ plan to hold heparin gtt & start SCD's.

## 2019-02-05 NOTE — Progress Notes (Signed)
NAME:  Joel Rogers, MRN:  [id number], DOB:  [birth year], LOS: 2 ADMISSION DATE:  02/03/2019, CONSULTATION DATE:  01/05/2019 REFERRING MD:  Sharen Heck - CIR, CHIEF COMPLAINT:  Post cardiac arrest   HPI/course in hospital  68 year old man who sustained a VF cardiac arrest on CIR.  He complained of acid-like burning retrosternal CP with no relief with Maalox.  Lost consciousness/pulse - CPR with defibrillation x3 prior to ROSC. Patient awake and communicative post arrest.  Had been admitted to CIR following admission for right hemiparesis and staphylococcal bacteremia from cellulitis.  MRI consistent with multiple cardiobembolic strokes. No vegetation seen on TEE, TAVR valve intact.  Developed CG emesis and hemodynamically significant UGIB requiring 6 units of blood. Eliquis and ASA held.  EGD: ischemic changes in gastric pouch. Anticoagulation placed on hold.  Improved to point he was transferred to CIR on 3/10. Was due to restart ASA and Eliquis on 3/16 while on rehab.  Past Medical History   Past Medical History:  Diagnosis Date   Anemia    low iron   Arthritis    bilateral knees   Atrial fibrillation, chronic    Chronic diastolic CHF (congestive heart failure) (HCC) 06/15/2018   History of subdural hematoma    Hyperlipidemia    Hypertension    Morbid obesity (HCC)    Normal coronary arteries    by cardiac catheterization performed 03/14/06   Pre-diabetes    pt denies   S/P TAVR (transcatheter aortic valve replacement)    a. 07/25/18: Edwards Sapien 3 THV (size 26 mm, model # 9600TFX, serial # [id number]) by Dr. Laneta Simmers and Dr. Clifton James   Sleep apnea    on CPAP   Venous insufficiency      Past Surgical History:  Procedure Laterality Date   BIOPSY  01/26/2019   Procedure: BIOPSY;  Surgeon: Bernette Redbird, MD;  Location: [facility] ENDOSCOPY;  Service: Endoscopy;;   CRANIOTOMY Right 08/27/2016   Procedure: CRANIOTOMY HEMATOMA EVACUATION SUBDURAL;  Surgeon: Maeola Harman, MD;   Location: [facility] OR;  Service: Neurosurgery;  Laterality: Right;   ESOPHAGOGASTRODUODENOSCOPY N/A 01/21/2019   Procedure: ESOPHAGOGASTRODUODENOSCOPY (EGD);  Surgeon: Vida Rigger, MD;  Location: [facility] ENDOSCOPY;  Service: Endoscopy;  Laterality: N/A;   ESOPHAGOGASTRODUODENOSCOPY (EGD) WITH PROPOFOL N/A 01/22/2019   Procedure: ESOPHAGOGASTRODUODENOSCOPY (EGD) WITH PROPOFOL;  Surgeon: Bernette Redbird, MD;  Location: [facility] ENDOSCOPY;  Service: Endoscopy;  Laterality: N/A;   ESOPHAGOGASTRODUODENOSCOPY (EGD) WITH PROPOFOL N/A 01/26/2019   Procedure: ESOPHAGOGASTRODUODENOSCOPY (EGD) WITH PROPOFOL;  Surgeon: Bernette Redbird, MD;  Location: [facility] ENDOSCOPY;  Service: Endoscopy;  Laterality: N/A;   GASTRIC BYPASS  09/2008   JOINT REPLACEMENT  08/31/2006   right hip   MULTIPLE EXTRACTIONS WITH ALVEOLOPLASTY N/A 07/13/2018   Procedure: Extraction of tooth #'s 3,8,10, 23-26, 30and 32 with alveoloplasty and gross debridement of remaining teeth;  Surgeon: Charlynne Pander, DDS;  Location: [facility] OR;  Service: Oral Surgery;  Laterality: N/A;   RIGHT HEART CATH N/A 07/06/2018   Procedure: RIGHT HEART CATH;  Surgeon: Kathleene Hazel, MD;  Location: [facility] INVASIVE CV LAB;  Service: Cardiovascular;  Laterality: N/A;   RIGHT/LEFT HEART CATH AND CORONARY ANGIOGRAPHY N/A 07/10/2018   Procedure: RIGHT/LEFT HEART CATH AND CORONARY ANGIOGRAPHY;  Surgeon: Dolores Patty, MD;  Location: [facility] INVASIVE CV LAB;  Service: Cardiovascular;  Laterality: N/A;   TEE WITHOUT CARDIOVERSION N/A 07/25/2018   Procedure: TRANSESOPHAGEAL ECHOCARDIOGRAM (TEE);  Surgeon: Kathleene Hazel, MD;  Location: [facility] OR;  Service: Open Heart Surgery;  Laterality: N/A;  TEE WITHOUT CARDIOVERSION N/A 01/18/2019   Procedure: TRANSESOPHAGEAL ECHOCARDIOGRAM (TEE) WITH PROPOFOL;  Surgeon: Jonelle Sidle, MD;  Location: [facility] ORS;  Service: Cardiovascular;  Laterality: N/A;   TOTAL HIP ARTHROPLASTY     right hip   TRANSCATHETER AORTIC VALVE REPLACEMENT,  TRANSFEMORAL  07/25/2018   TRANSCATHETER AORTIC VALVE REPLACEMENT, TRANSFEMORAL N/A 07/25/2018   Procedure: TRANSCATHETER AORTIC VALVE REPLACEMENT, TRANSFEMORAL;  Surgeon: Kathleene Hazel, MD;  Location: [facility] OR;  Service: Open Heart Surgery;  Laterality: N/A;      Interim history/subjective:  No events overnight. Hg dropped from ~9 to ~7. Remained HDS. Denies hemoptysis, hematemesis or melena. No abdominal pain. Still endorses chest pain with palpation.  Objective   Blood pressure 109/75, pulse 76, temperature 97.6 F (36.4 C), temperature source Oral, resp. rate 17, height  (1.854 m), weight 124.3 kg, SpO2 100 %.        Intake/Output Summary (Last 24 hours) at 02/05/2019 1218 Last data filed at 02/05/2019 1100 Gross per 24 hour  Intake 618.13 ml  Output 1300 ml  Net -681.87 ml   Filed Weights   02/03/19 1600 02/04/19 1700 02/05/19 0500  Weight: 122 kg 120.8 kg 124.3 kg    Physical Exam: General: Well-appearing, no acute distress HENT: [place], AT, OP clear, MMM Eyes: EOMI, no scleral icterus Respiratory: Clear to auscultation bilaterally.  No crackles, wheezing or rales Cardiovascular: RRR, -M/R/G, no JVD GI: BS+, soft, nontender Extremities: 2+ pitting edema in lower extremities,-tenderness Neuro: AAO x 4, right hemiparesis Musculoskeletal: moderate chest wall tenderness to palpation  Psych: Normal mood, normal affect GU: Condom cath in place  Ancillary tests (personally reviewed)  CBC: Recent Labs  Lab 01/30/19 0830 01/31/19 0612 02/03/19 0834 02/04/19 0445 02/05/19 0506  WBC 16.7* 14.8* 26.5* 15.6* 16.3*  NEUTROABS 14.0* 12.2*  --   --   --   HGB 9.3* 8.8* 9.3* 9.2* 7.2*  HCT 29.1* 27.2* 28.7* 28.6* 22.5*  MCV 81.5 81.7 82.0 82.2 83.3  PLT 121* 130* 136* 138* 142*    Basic Metabolic Panel: Recent Labs  Lab 01/31/19 0612 02/03/19 0834 02/03/19 1416 02/04/19 0445 02/05/19 0506  NA 137 136 136 136 136  K 3.5 3.0* 3.6 4.6 5.0  CL 108 109 109 109  110  CO2 26 20* GLUCOSE 106* 140* 108* 109* 98  BUN 24*  CREATININE 0.93 0.96 1.10 1.24 1.34*  CALCIUM 8.2* 8.2* 8.1* 8.1* 7.5*  MG  --  1.7 1.9  --   --    GFR: Estimated Creatinine Clearance: 72.9 mL/min (A) (by C-G formula based on SCr of 1.34 mg/dL (H)). Recent Labs  Lab 01/31/19 0612 02/03/19 0834 02/03/19 0852 02/03/19 1416 02/04/19 0445 02/05/19 0506  WBC 14.8* 26.5*  --   --  15.6* 16.3*  LATICACIDVEN  --   --  2.2* 1.0  --   --     Liver Function Tests: Recent Labs  Lab 01/31/19 0612 02/03/19 0834  AST 24 120*  ALT 6 39  ALKPHOS 79 98  BILITOT 1.0 1.3*  PROT 5.6* 6.1*  ALBUMIN 1.7* 2.0*   No results for input(s): LIPASE, AMYLASE in the last 168 hours. No results for input(s): AMMONIA in the last 168 hours.  ABG    Component Value Date/Time   PHART 7.362 07/25/2018 0804   PCO2ART 45.6 07/25/2018 0804   PO2ART 333.0 (H) 07/25/2018 0804   HCO3 22.7 01/13/2019 1550   TCO2 25 07/25/2018 1018  ACIDBASEDEF 2.2 (H) 01/13/2019 1550   O2SAT 74.9 01/13/2019 1550     Coagulation Profile: Recent Labs  Lab 02/03/19 0834  INR 1.4*    Cardiac Enzymes: Recent Labs  Lab 02/03/19 0740 02/03/19 0834 02/03/19 1416 02/03/19 2038  CKTOTAL 131  --   --   --   CKMB 2.8  --   --   --   TROPONINI  --  0.20* 12.24* >65.00*    HbA1C: Hemoglobin A1C  Date/Time Value Ref Range Status  01/22/2016 6.2  Final   Hgb A1c MFr Bld  Date/Time Value Ref Range Status  07/21/2018 10:20 AM 5.8 (H) 4.8 - 5.6 % Final    Comment:    (NOTE) Pre diabetes:          5.7%-6.4% Diabetes:              >6.4% Glycemic control for   <7.0% adults with diabetes   11/26/2016 02:06 PM 5.6 <5.7 % Final    Comment:      For the purpose of screening for the presence of diabetes:   <5.7%       Consistent with the absence of diabetes 5.7-6.4 %   Consistent with increased risk for diabetes (prediabetes) >=6.5 %     Consistent with diabetes   This assay result  is consistent with a decreased risk of diabetes.   Currently, no consensus exists regarding use of hemoglobin A1c for diagnosis of diabetes in children.   According to American Diabetes Association (ADA) guidelines, hemoglobin A1c <7.0% represents optimal control in non-pregnant diabetic patients. Different metrics may apply to specific patient populations. Standards of Medical Care in Diabetes (ADA).       CBG: No results for input(s): GLUCAP in the last 168 hours.  EKG: atrial fibrillation with right bundle branch block - personally viewed imaging      TTE 2/24: EF 65% with normal Edwards Sapiens valve.  RV moderately reduced. RA/LA enlarged.     Assessment & Plan:   VF cardiac arrest NSTEMI Atrial fibrillation Severe aortic stenosis s/p TAVR No further episodes of VT on telemetry Plan: Cardiology planning for catheterization today Lidocaine has been discontinued F/u TTE Continue BB, ASA Hold heparin in setting of suspected GIB Telemetry  Acute blood loss anemia No evidence of active bleeding. HDS. Recent UGIB secondary to necrosis and erosion in GJ anastomosis. Plan: Receiving 1 U PRBC now PPI BID Post-transfusion CBC Hold heparin in setting of GIB  Best practice:  Diet: NPO Pain/Anxiety/Delirium protocol (if indicated): prn only VAP protocol (if indicated): not applicable DVT prophylaxis: therapeutic IV heparin GI prophylaxis: IV PPI Urinary catheter: Condom catheter only. Glucose control: Phase 1 protocol Mobility: BR with WC priv. Code Status: Full Family Communication: wife updated at bedside. Disposition: ICU.  If stable will plan to transfer to cardiac step down post-cath and transfer care to Cards as primary. Discussed with Cardiology.  Critical care time: N/A   Mechele Collin, M.D. [facility] Pulmonary/Critical Care Medicine Pager: [phone] After hours pager: [phone]

## 2019-02-05 NOTE — Progress Notes (Signed)
ANTICOAGULATION CONSULT NOTE  Pharmacy Consult for heparin Indication: chest pain/ACS and atrial fibrillation  Allergies  Allergen Reactions  . Kayexalate [Polystyrene] Other (See Comments)    Recommended to avoid by GI due to ischemic gastropathy    Patient Measurements: Height: 6\' 1"  (185.4 cm) Weight: 274 lb 0.5 oz (124.3 kg) IBW/kg (Calculated) : 79.9 Heparin Dosing Weight: 106kg  Vital Signs: Temp: 98.5 F (36.9 C) (03/16 0400) Temp Source: Oral (03/16 0400) BP: 107/90 (03/16 0600) Pulse Rate: 67 (03/16 0400)  Labs: Recent Labs    02/03/19 0740  02/03/19 0834 02/03/19 1416 02/03/19 2038 02/04/19 0445 02/05/19 0505 02/05/19 0506  HGB  --    < > 9.3*  --   --  9.2*  --  7.2*  HCT  --   --  28.7*  --   --  28.6*  --  22.5*  PLT  --   --  136*  --   --  138*  --  142*  APTT  --   --  37*  --   --   --   --   --   LABPROT  --   --  16.7*  --   --   --   --   --   INR  --   --  1.4*  --   --   --   --   --   HEPARINUNFRC  --   --   --   --  0.48 0.55 0.38  --   CREATININE  --    < > 0.96 1.10  --  1.24  --  1.34*  CKTOTAL 131  --   --   --   --   --   --   --   CKMB 2.8  --   --   --   --   --   --   --   TROPONINI  --   --  0.20* 12.24* >65.00*  --   --   --    < > = values in this interval not displayed.    Estimated Creatinine Clearance: 72.9 mL/min (A) (by C-G formula based on SCr of 1.34 mg/dL (H)).   Medical History: Past Medical History:  Diagnosis Date  . Anemia    low iron  . Arthritis    bilateral knees  . Atrial fibrillation, chronic   . Chronic diastolic CHF (congestive heart failure) (HCC) 06/15/2018  . History of subdural hematoma   . Hyperlipidemia   . Hypertension   . Morbid obesity (HCC)   . Normal coronary arteries    by cardiac catheterization performed 03/14/06  . Pre-diabetes    pt denies  . S/P TAVR (transcatheter aortic valve replacement)    a. 07/25/18: Edwards Sapien 3 THV (size 26 mm, model # 9600TFX, serial # [id number]) by Dr.  Laneta Simmers and Dr. Clifton James  . Sleep apnea    on CPAP  . Venous insufficiency     Medications:  Apixaban prior to admission  Assessment: 68 year old male originally admitted to [facility] with bacteremia and cellulitis. Found to have bilateral CVAs and small SAH. Also found blood and blood clot in stomach but unable to anticoagulate. Patient was later transferred to CIR on 3/10. Patient was on apixaban prior to admission, d/t GIB this was never restarted during this admission.   Pt now on heparin for ACS rule out and AFib. Planning for cardiac cath this week. Heparin is therapeutic, H/H down  to 7.2, no S/Sx bleeding noted. Pltc remains stable.   Goal of Therapy:  Heparin level 0.3-0.7 units/ml Monitor platelets by anticoagulation protocol: Yes   Plan:  -Continue heparin 1400 units/hr -Daily heparin level and CBC  Fredonia Highland, PharmD, BCPS Clinical Pharmacist [phone] Please check AMION for all [facility] numbers 02/05/2019

## 2019-02-05 NOTE — Progress Notes (Signed)
eLink Physician-Brief Progress Note Patient Name: Joel Rogers DOB: 07-24-1951 MRN: [id number]   Date of Service  02/05/2019  HPI/Events of Note  H/H 7.2/22.5 from 9.2/28.6 no report of active bleeding. Recent MI for cath tomorrow  eICU Interventions  Transfuse 1 unit PRBC     Intervention Category Intermediate Interventions: Bleeding - evaluation and treatment with blood products  Rosalie Gums Hetal Proano 02/05/2019, 6:08 AM

## 2019-02-05 NOTE — Interval H&P Note (Signed)
Cath Lab Visit (complete for each Cath Lab visit)  Clinical Evaluation Leading to the Procedure:   ACS: Yes.    Non-ACS:    Anginal Classification: CCS IV  Anti-ischemic medical therapy: Minimal Therapy (1 class of medications)  Non-Invasive Test Results: No non-invasive testing performed  Prior CABG: No previous CABG   Cardiac arrest. Planning diagnostic only.    History and Physical Interval Note:  02/05/2019 3:59 PM  Joel Rogers  has presented today for surgery, with the diagnosis of n stemi.  The various methods of treatment have been discussed with the patient and family. After consideration of risks, benefits and other options for treatment, the patient has consented to  Procedure(s): LEFT HEART CATH AND CORONARY ANGIOGRAPHY (N/A) as a surgical intervention.  The patient's history has been reviewed, patient examined, no change in status, stable for surgery.  I have reviewed the patient's chart and labs.  Questions were answered to the patient's satisfaction.     Lance Muss

## 2019-02-05 NOTE — Progress Notes (Signed)
Patient back from cath lab right radial sigte looks good no bleeding. patient could not hold urine and incontinent. Cleaned and repositioned. At that time had incontinent stool. Black and maroon in color.cleaned and repositioned. Dr Everardo All made aware. Orders received. C/o rib pain  And leg pain when turning.

## 2019-02-05 NOTE — Progress Notes (Signed)
Verbal orders from Dr. Mayford Knife to transfuse 1 unit PRBC's, BMET @0500 , place SCD's, and hold heparin gtt.  Blood bank called this RN to inform of new protocol placed by medical director that blood transfusion shipments are not being regularly delivered (due to covid-19) until further notice. Because blood products are scarce, the medical director is withholding PRBC's unless the pt's Hgb <7.0. The medical director will gladly speak with Cardiology on-call to see if this pt critically needs PRBC's. Gave blood-bank Catering manager # for further questions.  This RN paged Cardiology on-call to speak with them to determine pt's need for PRBC's & to see if she needs to speak w/ medical director.

## 2019-02-05 NOTE — H&P (View-Only) (Signed)
Progress Note  Patient Name: Joel Rogers Date of Encounter: 02/05/2019  Primary Cardiologist: Nanetta Batty, MD   Subjective   C/o chest soreness related to CPR. No shortness of breath.  Inpatient Medications    Scheduled Meds: . sodium chloride   Intravenous Once  . aspirin EC  81 mg Oral Daily  . Chlorhexidine Gluconate Cloth  6 each Topical Q0600  . mouth rinse  15 mL Mouth Rinse BID  . metoprolol tartrate  50 mg Oral BID  . pantoprazole (PROTONIX) IV  40 mg Intravenous Q12H  . sodium chloride flush  3 mL Intravenous Q12H   Continuous Infusions: . sodium chloride    . sodium chloride 75 mL/hr at 02/05/19 0152  . sodium chloride    . heparin 1,400 Units/hr (02/05/19 0400)   PRN Meds: sodium chloride, acetaminophen, nitroGLYCERIN, ondansetron (ZOFRAN) IV, sodium chloride flush, traMADol-acetaminophen   Vital Signs    Vitals:   02/05/19 0400 02/05/19 0500 02/05/19 0600 02/05/19 0749  BP: 104/77 98/67 107/90   Pulse: 67     Resp: Temp: 98.5 F (36.9 C)   97.9 F (36.6 C)  TempSrc: Oral   Oral  SpO2: 100% 100% 100%   Weight:  124.3 kg    Height:        Intake/Output Summary (Last 24 hours) at 02/05/2019 0834 Last data filed at 02/05/2019 0600 Gross per 24 hour  Intake 520.16 ml  Output 700 ml  Net -179.84 ml   Last 3 Weights 02/05/2019 02/04/2019 02/03/2019  Weight (lbs) 274 lb 0.5 oz 266 lb 5.1 oz 268 lb 15.4 oz  Weight (kg) 124.3 kg 120.8 kg 122 kg      Telemetry    Normal sinus rhythm. Short runs of NSVT noted  - Personally Reviewed  ECG    Sinus rhythm with right bundle branch block- Personally Reviewed  Physical Exam  Alert, oriented male in NAD GEN: No acute distress.   Neck: Mild JVD Cardiac: RRR, 1/6 SEM at the RUSB Respiratory: Clear to auscultation bilaterally. GI: Soft, nontender, non-distended  MS: diffuse 2+ edema; No deformity. Neuro:  Nonfocal  Psych: Normal affect   Labs    Chemistry Recent Labs  Lab  01/31/19 0612 02/03/19 0834 02/03/19 1416 02/04/19 0445 02/05/19 0506  NA 137 136 136 136 136  K 3.5 3.0* 3.6 4.6 5.0  CL 108 109 109 109 110  CO2 26 20* GLUCOSE 106* 140* 108* 109* 98  BUN 24*  CREATININE 0.93 0.96 1.10 1.24 1.34*  CALCIUM 8.2* 8.2* 8.1* 8.1* 7.5*  PROT 5.6* 6.1*  --   --   --   ALBUMIN 1.7* 2.0*  --   --   --   AST 24 120*  --   --   --   ALT 6 39  --   --   --   ALKPHOS 79 98  --   --   --   BILITOT 1.0 1.3*  --   --   --   GFRNONAA >60 >60 >60 59* 54*  GFRAA >60 >60 >60 >60 >60  ANIONGAP 3* 3*     Hematology Recent Labs  Lab 02/03/19 0834 02/04/19 0445 02/05/19 0506  WBC 26.5* 15.6* 16.3*  RBC 3.50* 3.48* 2.70*  HGB 9.3* 9.2* 7.2*  HCT 28.7* 28.6* 22.5*  MCV 82.0 82.2 83.3  MCH 26.6 26.4 26.7  MCHC 32.4 32.2  32.0  RDW 20.7* 20.9* 21.0*  PLT 136* 138* 142*    Cardiac Enzymes Recent Labs  Lab 02/03/19 0834 02/03/19 1416 02/03/19 2038  TROPONINI 0.20* 12.24* >65.00*   No results for input(s): TROPIPOC in the last 168 hours.   BNP Recent Labs  Lab 02/03/19 0834  BNP 408.1*     DDimer No results for input(s): DDIMER in the last 168 hours.   Radiology    Dg Chest Port 1 View  Result Date: 02/03/2019 CLINICAL DATA:  Ventricular tachycardia EXAM: PORTABLE CHEST 1 VIEW COMPARISON:  January 21, 2019. FINDINGS: Central catheter tip is in the left innominate vein. No pneumothorax. There is no evident edema or consolidation. There is generalized cardiomegaly with pulmonary vascularity normal. There is a prosthetic aortic valve. There is aortic atherosclerosis. No evident adenopathy. No bone lesions. IMPRESSION: Central catheter tip in left innominate vein. No pneumothorax. Generalized cardiomegaly. No edema or consolidation. Aortic Atherosclerosis (ICD10-I70.0). Electronically Signed   By: Bretta Bang III M.D.   On: 02/03/2019 14:28    Cardiac Studies   TEE 01-18-2019: IMPRESSIONS    1. The left ventricle  has normal systolic function, with an ejection fraction of 60-65%. No evidence of left ventricular regional wall motion abnormalities.  2. No evidence of left ventricular regional wall motion abnormalities.  3. Left atrial size was severely dilated. No left atrial appendage thrombus noted.  4. Right atrial size was severely dilated. No right atrial appendage thrombus noted.  5. The mitral valve is normal in structure. There is mild to moderate mitral annular calcification present.  6. The tricuspid valve was normal in structure. Tricuspid valve regurgitation is mild-moderate.  7. A 26 Edwards Sapien bioprosthetic aortic valve (TAVR) valve is present in the aortic position.  8. The right ventricle has moderately reduced systolic function. The cavity was mildly enlarged.  9. There is evidence of mild plaque in the descending aorta. 10. No valvular vegetations are identified.  FINDINGS  Left Ventricle: The left ventricle has normal systolic function, with an ejection fraction of 60-65%. No evidence of left ventricular regional wall motion abnormalities. Right Ventricle: The right ventricle has moderately reduced systolic function. The cavity was mildly enlarged. Left Atrium: Left atrial size was severely dilated. There is continuous echo contrast seen in the left atrial cavity and left atrial appendage. Right Atrium: Right atrial size was severely dilated. Right atrial pressure is estimated at 10 mmHg. Interatrial Septum: No atrial level shunt detected by color flow Doppler. Mitral Valve: The mitral valve is normal in structure. There is mild to moderate mitral annular calcification present. Mitral valve regurgitation is mild by color flow Doppler. Tricuspid Valve: The tricuspid valve was normal in structure. Tricuspid valve regurgitation is mild-moderate by color flow Doppler. Aortic Valve: The aortic valve has been repaired/replaced Aortic valve regurgitation was not visualized by color flow  Doppler. A 26 Edwards Sapien bioprosthetic, stented aortic valve (TAVR) valve is present in the aortic position. Pulmonic Valve: The pulmonic valve was grossly normal. Pulmonic valve regurgitation is trivial by color flow Doppler. Aorta: There is evidence of plaque in the descending aorta.   RIGHT ATRIUM RA Pressure: 10 mmHg  Cardiac Cath 07-10-2018: Findings:  Ao = 118/83 (104) LV = 207/30 RA = 17 RV = 87/13 PA = 91/34 (54) PCW = 28 Fick cardiac output/index = 10.3/4.4 PVR = 2.5 WU FA sat = 99% PA sat = 78%, 79%  Aortic valve: Mean gradient 67 mmHG  Peak gradient  AVA 1.06  cm2  Assessment:  1. Normal coronaries 2. Normal LV function EF 65% 3. Severe aortic stenosis 4. Severe pulmonary venous HTN with high-output state  Plan/Discussion:  Continue diuresis. Continue w/u for TAVR.   Patient Profile     68 y.o. male with history of severe aortic stenosis and TAVR approximately 6 months ago.  He presents now with large non-STEMI troponin greater than 65 and V. fib cardiac arrest.  This occurred after recent cardioembolic stroke without clear source of embolism by TEE evaluation.  Assessment & Plan    1.  Non-STEMI: normal cors pre-TAVR cath. Suspect embolic source of MI. Also possible that TAVR valve has caused coronary obstruction but think this is less likely - reviewed pre-TAVR CTA and coronary heights/sinus size adequate. I've recommended cardiac cath for diagnostic purposes. I have reviewed the risks, indications, and alternatives to cardiac catheterization, possible angioplasty, and stenting with the patient. Risks include but are not limited to bleeding, infection, vascular injury, stroke, myocardial infection, arrhythmia, kidney injury, radiation-related injury in the case of prolonged fluoroscopy use, emergency cardiac surgery, and death. The patient understands the risks of serious complication is 1-2 in 1000 with diagnostic cardiac cath and 1-2% or less  with angioplasty/stenting.   2.  Ventricular fibrillation arrest/nonsustained VT: Likely ischemic mediated - IV lidocaine now discontinued. Short runs of NSVT but no further sustained arrhythmia.   3.  Permanent atrial fibrillation with marked biatrial enlargement: currently on IV heparin. Will transition back to NOAC alone when stable. Concerned about 2G HgB drop overnight. Transfuse 1 u PRBC's today. Follow H/H. Involve GI if recurrent issues. Needs diagnostic cath prior to further GI workup in my opinion. Plan cath today after repeat H/H done post-transfusion.       For questions or updates, please contact [facility] Please consult [email or web address] for contact info under        Signed, Tonny Bollman, MD  02/05/2019, 8:34 AM

## 2019-02-06 ENCOUNTER — Inpatient Hospital Stay (HOSPITAL_COMMUNITY): Payer: Medicare Other

## 2019-02-06 ENCOUNTER — Encounter (HOSPITAL_COMMUNITY): Payer: Self-pay | Admitting: Interventional Cardiology

## 2019-02-06 DIAGNOSIS — K922 Gastrointestinal hemorrhage, unspecified: Secondary | ICD-10-CM

## 2019-02-06 DIAGNOSIS — I639 Cerebral infarction, unspecified: Secondary | ICD-10-CM

## 2019-02-06 DIAGNOSIS — R4182 Altered mental status, unspecified: Secondary | ICD-10-CM

## 2019-02-06 DIAGNOSIS — I249 Acute ischemic heart disease, unspecified: Secondary | ICD-10-CM

## 2019-02-06 LAB — CBC
HCT: 24.6 % — ABNORMAL LOW (ref 39.0–52.0)
HCT: 27.5 % — ABNORMAL LOW (ref 39.0–52.0)
HEMOGLOBIN: 8 g/dL — AB (ref 13.0–17.0)
Hemoglobin: 9.1 g/dL — ABNORMAL LOW (ref 13.0–17.0)
MCH: 26.8 pg (ref 26.0–34.0)
MCH: 27.1 pg (ref 26.0–34.0)
MCHC: 32.5 g/dL (ref 30.0–36.0)
MCHC: 33.1 g/dL (ref 30.0–36.0)
MCV: 82.6 fL (ref 80.0–100.0)
MCV: 81.8 fL (ref 80.0–100.0)
Platelets: 146 10*3/uL — ABNORMAL LOW (ref 150–400)
Platelets: 172 10*3/uL (ref 150–400)
RBC: 2.98 MIL/uL — ABNORMAL LOW (ref 4.22–5.81)
RBC: 3.36 MIL/uL — ABNORMAL LOW (ref 4.22–5.81)
RDW: 18.5 % — ABNORMAL HIGH (ref 11.5–15.5)
RDW: 18.5 % — ABNORMAL HIGH (ref 11.5–15.5)
WBC: 15.7 10*3/uL — ABNORMAL HIGH (ref 4.0–10.5)
WBC: 22.1 10*3/uL — ABNORMAL HIGH (ref 4.0–10.5)
nRBC: 0 % (ref 0.0–0.2)
nRBC: 0 % (ref 0.0–0.2)

## 2019-02-06 LAB — TYPE AND SCREEN
ABO/RH(D): O POS
Antibody Screen: NEGATIVE
Unit division: 0
Unit division: 0

## 2019-02-06 LAB — BASIC METABOLIC PANEL
Anion gap: 4 — ABNORMAL LOW (ref 5–15)
BUN: 40 mg/dL — ABNORMAL HIGH (ref 8–23)
CO2: 22 mmol/L (ref 22–32)
Calcium: 7.9 mg/dL — ABNORMAL LOW (ref 8.9–10.3)
Chloride: 111 mmol/L (ref 98–111)
Creatinine, Ser: 1.28 mg/dL — ABNORMAL HIGH (ref 0.61–1.24)
GFR calc Af Amer: >60 mL/min (ref >60)
GFR calc non Af Amer: 57 mL/min — ABNORMAL LOW (ref >60)
GLUCOSE: 96 mg/dL (ref 70–99)
Potassium: 5 mmol/L (ref 3.5–5.1)
Sodium: 137 mmol/L (ref 135–145)

## 2019-02-06 LAB — BPAM RBC
Blood Product Expiration Date: 202004082359
Blood Product Expiration Date: 202004172359
ISSUE DATE / TIME: 202003161110
ISSUE DATE / TIME: 202003162351
Unit Type and Rh: 5100
Unit Type and Rh: 5100

## 2019-02-06 MED ORDER — SODIUM CHLORIDE 0.9 % IV SOLN
8.0000 mg/h | INTRAVENOUS | Status: DC
  Administered 2019-02-06 – 2019-02-09 (×7): 8 mg/h via INTRAVENOUS
  Filled 2019-02-06 (×9): qty 80

## 2019-02-06 MED ORDER — PANTOPRAZOLE SODIUM 40 MG IV SOLR: 40.0000 mg | Freq: Two times a day (BID) | INTRAVENOUS | Status: DC

## 2019-02-06 NOTE — Progress Notes (Signed)
CR following however pt inappropriate for our services at this time. He will need PT/OT first given h/o recent right hemiparesis and new changes today. Ethelda Chick CES, ACSM 10:55 AM 02/06/2019

## 2019-02-06 NOTE — Progress Notes (Addendum)
NAME:  Joel Rogers, MRN:  [id number], DOB:  04/22/1951, LOS: 3 ADMISSION DATE:  02/03/2019, CONSULTATION DATE:  01/05/2019 REFERRING MD:  Sharen Heck - CIR, CHIEF COMPLAINT:  Post cardiac arrest   HPI/course in hospital  68 year old man who sustained a VF cardiac arrest on CIR.  He complained of acid-like burning, retrosternal CP with no relief with Maalox. CPR with defibrillation x3 prior to ROSC. Patient awake and communicative post arrest.  Had been admitted to CIR following admission for right hemiparesis and staphylococcal bacteremia from cellulitis.  MRI consistent with multiple cardiobembolic strokes. No vegetation seen on TEE, TAVR valve intact.  Developed coffee ground emesis and hemodynamically significant UGIB requiring 6 units of blood. Eliquis and ASA held.  EGD: ischemic changes in gastric pouch. Anticoagulation placed on hold.  Improved to point he was transferred to CIR on 3/10. Was due to restart ASA and Eliquis on 3/16 while on rehab.  Past Medical History  Chronic AF  Chronic dCHF HLD HTN S/P TAVR OSA Obesity  Pre-diabetes   Studies  ECHO 3/16 >> LVEF 65%, RV mildly reduced systolic function, LA/RA severely dilated, bioprosthetic AV normal, possible small PFO, right bowing of interatrial septum suggestive of elevated left atrial pressure CT Head 3/17 >>   Cultures    Procedures  LHC 3/16 >> lat ramus lesion 99% stenosed, heavy thrombus, no other significant CAD  Events  3/14 Readmit to [facility] with VF cardiac arrest 3/16 Hgb dropped from ~9 to ~7. Remained HDS. Endorses chest pain with palpation.  Interim history/subjective:  RN reports concern for mental status change.  Not sure about given meds oral.   Objective   Blood pressure (!) 157/80, pulse 65, temperature 98 F (36.7 C), temperature source Oral, resp. rate 13, height  (1.854 m), weight 122.9 kg, SpO2 100 %.        Intake/Output Summary (Last 24 hours) at 02/06/2019 0957 Last data filed at 02/06/2019  0220 Gross per 24 hour  Intake 1676.79 ml  Output 2450 ml  Net -773.21 ml   Filed Weights   02/04/19 1700 02/05/19 0500 02/06/19 0435  Weight: 120.8 kg 124.3 kg 122.9 kg    Physical Exam: General: elderly male lying in bed in NAD HEENT: MM pink/moist, [place] O2 Neuro: Awakens to voice, answers one word questions, mental status different from prior interactions with patient on previous consult (baseline AAOx4, gregarious) CV: s1s2 rrr, ? Murmur  PULM: even/non-labored, lungs bilaterally clear  WU:JWJX, non-tender, bsx4 active  Extremities: warm/dry, 2+ BLE pitting edema  Skin: no rashes or lesions  Ancillary tests (personally reviewed)  CBC: Recent Labs  Lab 01/31/19 0612  02/05/19 0506 02/05/19 1440 02/05/19 2004 02/05/19 2231 02/06/19 0429  WBC 14.8*   < > 16.3* 16.1* 17.0* 15.0* 15.7*  NEUTROABS 12.2*  --   --   --   --   --   --   HGB 8.8*   < > 7.2* 8.1* 7.8* 7.2* 8.0*  HCT 27.2*   < > 22.5* 24.9* 24.3* 21.7* 24.6*  MCV 81.7   < > 83.3 82.7 82.4 83.1 82.6  PLT 130*   < > 142* 155 154 145* 146*   < > = values in this interval not displayed.    Basic Metabolic Panel: Recent Labs  Lab 02/03/19 0834 02/03/19 1416 02/04/19 0445 02/05/19 0506 02/06/19 0429  NA 136 136 136 136 137  K 3.0* 3.6 4.6 5.0 5.0  CL 109 109 109 110 111  CO2 20* 22 22 23 22   GLUCOSE 140* 108* 109* 98 96  BUN 11 13 17  24* 40*  CREATININE 0.96 1.10 1.24 1.34* 1.28*  CALCIUM 8.2* 8.1* 8.1* 7.5* 7.9*  MG 1.7 1.9  --   --   --    GFR: Estimated Creatinine Clearance: 75.9 mL/min (A) (by C-G formula based on SCr of 1.28 mg/dL (H)). Recent Labs  Lab 02/03/19 0852 02/03/19 1416  02/05/19 1440 02/05/19 2004 02/05/19 2231 02/06/19 0429  WBC  --   --    < > 16.1* 17.0* 15.0* 15.7*  LATICACIDVEN 2.2* 1.0  --   --   --   --   --    < > = values in this interval not displayed.    Liver Function Tests: Recent Labs  Lab 01/31/19 0612 02/03/19 0834  AST 24 120*  ALT 6 39  ALKPHOS 79 98   BILITOT 1.0 1.3*  PROT 5.6* 6.1*  ALBUMIN 1.7* 2.0*   No results for input(s): LIPASE, AMYLASE in the last 168 hours. No results for input(s): AMMONIA in the last 168 hours.  ABG    Component Value Date/Time   PHART 7.362 07/25/2018 0804   PCO2ART 45.6 07/25/2018 0804   PO2ART 333.0 (H) 07/25/2018 0804   HCO3 22.7 01/13/2019 1550   TCO2 25 07/25/2018 1018   ACIDBASEDEF 2.2 (H) 01/13/2019 1550   O2SAT 74.9 01/13/2019 1550     Coagulation Profile: Recent Labs  Lab 02/03/19 0834  INR 1.4*    Cardiac Enzymes: Recent Labs  Lab 02/03/19 0740 02/03/19 0834 02/03/19 1416 02/03/19 2038  CKTOTAL 131  --   --   --   CKMB 2.8  --   --   --   TROPONINI  --  0.20* 12.24* >65.00*    HbA1C: Hemoglobin A1C  Date/Time Value Ref Range Status  01/22/2016 6.2  Final   Hgb A1c MFr Bld  Date/Time Value Ref Range Status  07/21/2018 10:20 AM 5.8 (H) 4.8 - 5.6 % Final    Comment:    (NOTE) Pre diabetes:          5.7%-6.4% Diabetes:              >6.4% Glycemic control for   <7.0% adults with diabetes   11/26/2016 02:06 PM 5.6 <5.7 % Final    Comment:      For the purpose of screening for the presence of diabetes:   <5.7%       Consistent with the absence of diabetes 5.7-6.4 %   Consistent with increased risk for diabetes (prediabetes) >=6.5 %     Consistent with diabetes   This assay result is consistent with a decreased risk of diabetes.   Currently, no consensus exists regarding use of hemoglobin A1c for diagnosis of diabetes in children.   According to American Diabetes Association (ADA) guidelines, hemoglobin A1c <7.0% represents optimal control in non-pregnant diabetic patients. Different metrics may apply to specific patient populations. Standards of Medical Care in Diabetes (ADA).       CBG: No results for input(s): GLUCAP in the last 168 hours.   Assessment & Plan:   VF Cardiac Arrest NSTEMI Atrial fibrillation Severe aortic stenosis s/p TAVR P:  Recommendations per Cardiology  Continue ASA, BB TTE as above > defer further evaluation of ? PFO to Cardiology  ICU monitoring  Not a candidate for anticoagulation given GIB  Altered Mental Status, ? Expressive Aphasia -note rise in BUN, ? Further bleeding P:  Neurology consulted per Cardiology  Follow neuro exam  CT head pending   Acute blood loss anemia No evidence of active bleeding. HDS. Recent UGIB secondary to necrosis and erosion in GJ anastomosis. P: Trend CBC PPI BID  Hold anticoagulation given GIB  Follow up H/H now Consider holding ASA >defer to GI / primary   UGIB -necrosis and erosion in GJ anastomosis  P: GI called per Cardiology  Hold heparin   At Risk Malnutrition  -AMS, concern for aspiration risk P: Place coretrak Continue meds PT  May need to start TF pending GI work up   [employer]:  Diet: NPO Pain/Anxiety/Delirium protocol (if indicated): prn only VAP protocol (if indicated): n/a DVT prophylaxis: therapeutic IV heparin GI prophylaxis: IV PPI Urinary catheter: Condom catheter  Glucose control: Phase 1 protocol Mobility: Bedrest with BR priv Code Status: Full Family Communication: No family at bedside am 3/17 Disposition: ICU.      Critical care time: N/A    Canary Brim, NP-C [facility] Pgr: [phone] or if no answer [phone] 02/06/2019, 10:09 AM

## 2019-02-06 NOTE — Progress Notes (Signed)
[facility] Progress Note  Joel Rogers 68 y.o. 08-21-1951  CC:   GI bleed   HPI GI has been asked to reevaluate this 68 year old patient with multiple medical issues with recent history of GI bleed.  Chart reviewed.  Apparently patient had 3 endoscopy earlier this month.  Finding concerning for ischemic stomatic ulcers based on last EGD on 01/26/2019. Marland Kitchen  Biopsy confirmed ischemia as well.  His anticoagulation was on hold .Patient developed V. fib arrest and chest pain on February 03, 2019.  Underwent left heart cath yesterday, February 05, 2019 and was found to have heavy thrombus probably from embolic events.  This morning he was found to have altered mental status.  CT head showed no acute changes and improvement in previous subarachnoid hemorrhage.  Patient seen and examined at bedside.  Wife at bedside.  Patient somewhat confused.  Discussed with the nursing staff.  Having small amount of dark tarry stools.  ROS : Not able to obtain   Objective: Vital signs in last 24 hours: Vitals:   02/06/19 0700 02/06/19 0816  BP: (!) 157/80   Pulse:    Resp: 13   Temp:  98 F (36.7 C)  SpO2: 100%     Physical Exam:  General:   Confused but resting comfortably in the bed  Head:  Normocephalic, without obvious abnormality, atraumatic  Eyes:  , EOM's intact,   Lungs:   Clear to auscultation bilaterally, respirations unlabored  Heart:   Irregular rate with murmur  Abdomen:   Soft, non-tender, bowel sounds active all four quadrants,  no masses,   Extremities: LE edema noted.        Lab Results: Recent Labs    02/03/19 1416  02/05/19 0506 02/06/19 0429  NA 136   < > 136 137  K 3.6   < > 5.0 5.0  CL 109   < > 110 111  CO2 22   < > 23 22  GLUCOSE 108*   < > 98 96  BUN 13   < > 24* 40*  CREATININE 1.10   < > 1.34* 1.28*  CALCIUM 8.1*   < > 7.5* 7.9*  MG 1.9  --   --   --    < > = values in this interval not displayed.   No results for input(s): AST, ALT, ALKPHOS,  BILITOT, PROT, ALBUMIN in the last 72 hours. Recent Labs    02/05/19 2231 02/06/19 0429  WBC 15.0* 15.7*  HGB 7.2* 8.0*  HCT 21.7* 24.6*  MCV 83.1 82.6  PLT 145* 146*   No results for input(s): LABPROT, INR in the last 72 hours.    Assessment/Plan: -GI bleed from ischemic anastomotic ulcer.  Had 3 EGDs earlier this month. -Cardioembolic events.  Left heart cath 02/05/2019 showed heavy thrombus. -Atrial fibrillation.  Anticoagulation on hold because of GI bleed  -Altered mental status. -Multiple medical comorbidities.  Recommendations ------------------------ -Change Protonix twice daily to Protonix drip -Monitor H&H.  Transfuse to keep hemoglobin around 8. -If Hemoglobin stable, okay to start low-dose heparin drip from GI standpoint. -Patient's condition remains critical because of multiple comorbidities. -GI will follow   Kathi Der MD, FACP 02/06/2019, 11:02 AM  Contact #  [phone]

## 2019-02-06 NOTE — Consult Note (Addendum)
Neurology Consultation  Reason for Consult: AMS/Stroke Referring Physician: Excell Seltzer  CC: AMS  History is obtained from:chart and family at bedside  HPI: Joel Rogers is a 68 y.o. male  hx of Afib and stroke who recently had a stroke on 01/19/19, also had a GI bleed on 01/26/19 for which his Eliquis and ASA were held, were to restarted on 02/05/19 in rehab, had Vfib arrest on 02/03/19 at [facility] with ROSC and transferred to [facility] for higher level of care. Complained of chest pain before arrest.      ROS: unable to assess due to mental status  Past Medical History:  Diagnosis Date  . Anemia    low iron  . Arthritis    bilateral knees  . Atrial fibrillation, chronic   . Chronic diastolic CHF (congestive heart failure) (HCC) 06/15/2018  . History of subdural hematoma   . Hyperlipidemia   . Hypertension   . Morbid obesity (HCC)   . Normal coronary arteries    by cardiac catheterization performed 03/14/06  . Pre-diabetes    pt denies  . S/P TAVR (transcatheter aortic valve replacement)    a. 07/25/18: Edwards Sapien 3 THV (size 26 mm, model # 9600TFX, serial # [id number]) by Dr. Laneta Simmers and Dr. Clifton James  . Sleep apnea    on CPAP  . Venous insufficiency      Family History  Problem Relation Age of Onset  . Hypertension Father   . Cancer Father        prob prostate  . Alzheimer's disease Mother   . Alzheimer's disease Maternal Grandfather   . Breast cancer Sister   . Cancer Brother        "brain tumor"     Social History:   reports that he has quit smoking. He has never used smokeless tobacco. He reports that he does not drink alcohol or use drugs.  Medications  Current Facility-Administered Medications:  .  0.9 %  sodium chloride infusion, 250 mL, Intravenous, PRN, Corky Crafts, MD .  aspirin EC tablet 81 mg, 81 mg, Oral, Daily, Corky Crafts, MD, 81 mg at 02/05/19 1359 .  Chlorhexidine Gluconate Cloth 2 % PADS 6 each, 6 each, Topical, Q0600, Corky Crafts, MD, 6 each at 02/06/19 0445 .  MEDLINE mouth rinse, 15 mL, Mouth Rinse, BID, Corky Crafts, MD, 15 mL at 02/05/19 2215 .  metoprolol tartrate (LOPRESSOR) tablet 50 mg, 50 mg, Oral, BID, Lance Muss S, MD, 50 mg at 02/05/19 2215 .  nitroGLYCERIN (NITROSTAT) SL tablet 0.4 mg, 0.4 mg, Sublingual, Q5 Min x 3 PRN, Lance Muss S, MD .  ondansetron [facility]) injection 4 mg, 4 mg, Intravenous, Q6H PRN, Corky Crafts, MD, 4 mg at 02/05/19 1909 .  pantoprazole (PROTONIX) 80 mg in sodium chloride 0.9 % 250 mL (0.32 mg/mL) infusion, 8 mg/hr, Intravenous, Continuous, Brahmbhatt, Parag, MD, Last Rate: 25 mL/hr at 02/06/19 1218, 8 mg/hr at 02/06/19 1218 .  [START ON 02/09/2019] pantoprazole (PROTONIX) injection 40 mg, 40 mg, Intravenous, Q12H, Brahmbhatt, Parag, MD .  traMADol-acetaminophen (ULTRACET) 37.5-325 MG per tablet 1-2 tablet, 1-2 tablet, Oral, Q6H PRN, Corky Crafts, MD, 2 tablet at 02/06/19 0158   Exam: Current vital signs: BP (!) 152/116   Pulse 65   Temp 98 F (36.7 C) (Oral)   Resp 17   Ht 6\' 1"  (1.854 m)   Wt 122.9 kg   SpO2 100%   BMI 35.75 kg/m  Vital signs in last 24  hours: Temp:  [97.7 F (36.5 C)-98 F (36.7 C)] 98 F (36.7 C) (03/17 1117) Pulse Rate:  [65-93] 65 (03/17 0400) Resp:  [10-24] 17 (03/17 1100) BP: (91-166)/(57-116) 152/116 (03/17 1100) SpO2:  [98 %-100 %] 100 % (03/17 1100) Weight:  [122.9 kg] 122.9 kg (03/17 0435)  Physical Exam  Constitutional: Appears well-developed and well-nourished.  Psych: Affect appropriate to situation Eyes: No scleral injection HENT: No OP obstrucion Head: Normocephalic.  Cardiovascular: irregular rate and rhythm   Neuro: Mental Status: Patient is awake, alert, oriented to person only Is able to track Does perseverate Intermediately follows simple commands  Cranial Nerves: II: Visual Fields are full to confrontation. Pupils are equal, round, and reactive to light.   III,IV, VI:  EOMI without ptosis .  V: unable to answer VII: Facial movement is symmetric.  VIII: hearing is intact to voice X: Uvula elevates symmetrically XII: tongue is midline without atrophy or fasciculations.  Motor: Tone is normal. Bulk is normal. 3/5 strength in b/l UE and 2/5 in b/l LE's Sensory: Unable to answer Plantars: Toes are downgoing bilaterally.  Cerebellar: Unable to assess due AMS    Labs I have reviewed labs in epic and the results pertinent to this consultation are:   CBC    Component Value Date/Time   WBC 22.1 (H) 02/06/2019 1050   RBC 3.36 (L) 02/06/2019 1050   HGB 9.1 (L) 02/06/2019 1050   HGB 10.9 (L) 06/27/2018 1050   HCT 27.5 (L) 02/06/2019 1050   HCT 33.1 (L) 06/27/2018 1050   PLT 172 02/06/2019 1050   PLT 260 06/27/2018 1050   MCV 81.8 02/06/2019 1050   MCV 73 (L) 06/27/2018 1050   MCH 27.1 02/06/2019 1050   MCHC 33.1 02/06/2019 1050   RDW 18.5 (H) 02/06/2019 1050   RDW 19.5 (H) 06/27/2018 1050   LYMPHSABS 1.1 01/31/2019 0612   MONOABS 1.3 (H) 01/31/2019 0612   EOSABS 0.0 01/31/2019 0612   BASOSABS 0.0 01/31/2019 0612    CMP     Component Value Date/Time   NA 137 02/06/2019 0429   NA 141 08/03/2018 1421   K 5.0 02/06/2019 0429   CL 111 02/06/2019 0429   CO2 22 02/06/2019 0429   GLUCOSE 96 02/06/2019 0429   BUN 40 (H) 02/06/2019 0429   BUN 16 08/03/2018 1421   CREATININE 1.28 (H) 02/06/2019 0429   CREATININE 1.23 11/01/2018 1206   CALCIUM 7.9 (L) 02/06/2019 0429   PROT 6.1 (L) 02/03/2019 0834   ALBUMIN 2.0 (L) 02/03/2019 0834   AST 120 (H) 02/03/2019 0834   ALT 39 02/03/2019 0834   ALKPHOS 98 02/03/2019 0834   BILITOT 1.3 (H) 02/03/2019 0834   GFRNONAA 57 (L) 02/06/2019 0429   GFRNONAA 74 05/01/2018 0913   GFRAA >60 02/06/2019 0429   GFRAA 86 05/01/2018 0913    Lipid Panel     Component Value Date/Time   CHOL 104 02/04/2019 0445   TRIG 53 02/04/2019 0445   HDL 35 (L) 02/04/2019 0445   CHOLHDL 3.0 02/04/2019 0445   VLDL 11  02/04/2019 0445   LDLCALC 58 02/04/2019 0445   LDLCALC 70 05/01/2018 0913     Imaging I have reviewed the images obtained:  CT-scan of the brain- negative for any acute findings    02/06/2019, 12:51 PM     Assessment:   68 yo male with hx of Afib and stroke who recently had a stroke on 01/19/19, also had a GI bleed on 01/26/19 for which  his Eliquis and ASA were held, were to restarted on 02/05/19 in rehab, had Vfib arrest on 02/03/19 with ROSC.  Had a mental status change this am, where he was not answering questions as quickly and appeared confused.   Patient did have a cardiac cath yesterday on 01/1619 which shows heavy thrombus on the Lat Ramus.  Also seen by GI which are ok with starting low dose heparin drip from their perspective if H/H remains stable around 8   Impression:  Clinically appears encephalopathic vs aphasic from new stroke vs post ictal  New stroke possible peri-op from cath vs not on AC and afib vs toxic metabolic, does have elevated white count and AKI vs seizure  Recommendations:  - MRI brain to look for new infract and size  - Once MRI is done and can quantify size of new stroke if that's what he has then can make better judgement for anticoagulation  - In the meantime would start low dose ASA 81mg  if ok with GI  - routine EEG

## 2019-02-06 NOTE — Progress Notes (Signed)
Progress Note  Patient Name: Joel Rogers Date of Encounter: 02/06/2019  Primary Cardiologist: Nanetta Batty, MD   Subjective   Unable to provide meaningful history this am. No chest pain or dyspnea.   Inpatient Medications    Scheduled Meds: . sodium chloride   Intravenous Once  . sodium chloride   Intravenous Once  . aspirin EC  81 mg Oral Daily  . Chlorhexidine Gluconate Cloth  6 each Topical Q0600  . mouth rinse  15 mL Mouth Rinse BID  . metoprolol tartrate  50 mg Oral BID  . pantoprazole (PROTONIX) IV  40 mg Intravenous Q12H  . sodium chloride flush  3 mL Intravenous Q12H   Continuous Infusions: . sodium chloride 75 mL/hr at 02/05/19 0152  . sodium chloride    . heparin     PRN Meds: sodium chloride, acetaminophen, nitroGLYCERIN, ondansetron (ZOFRAN) IV, sodium chloride flush, traMADol-acetaminophen   Vital Signs    Vitals:   02/06/19 0500 02/06/19 0600 02/06/19 0700 02/06/19 0816  BP: 114/90 (!) 163/90 (!) 157/80   Pulse:      Resp: (!) 24 19 13    Temp:    98 F (36.7 C)  TempSrc:    Oral  SpO2: 100% 100% 100%   Weight:      Height:        Intake/Output Summary (Last 24 hours) at 02/06/2019 0918 Last data filed at 02/06/2019 0220 Gross per 24 hour  Intake 1676.79 ml  Output 2650 ml  Net -973.21 ml   Last 3 Weights 02/06/2019 02/05/2019 02/04/2019  Weight (lbs) 270 lb 15.1 oz 274 lb 0.5 oz 266 lb 5.1 oz  Weight (kg) 122.9 kg 124.3 kg 120.8 kg      Telemetry    Atrial fibrillation, ventricular rate controlled - Personally Reviewed   Physical Exam  Awake, alert male, not responding appropriately to questions. Expressive aphasia noted. Possible right neglect? GEN: No acute distress.   Neck: No JVD Cardiac: irregularly irregular, no murmurs, rubs, or gallops.  Respiratory: Clear to auscultation bilaterally. GI: Soft, nontender, non-distended  MS: 2+ bilateral pedal edema; No deformity. Neuro:  see above. Not following commands appropriately.  Moving all extremities but not purposefully to command.   Labs    Chemistry Recent Labs  Lab 01/31/19 0612 02/03/19 0834  02/04/19 0445 02/05/19 0506 02/06/19 0429  NA 137 136   < > 136 136 137  K 3.5 3.0*   < > 4.6 5.0 5.0  CL 108 109   < > 109 110 111  CO2 26 20*   < > 22 23 22   GLUCOSE 106* 140*   < > 109* 98 96  BUN 11 11   < > 17 24* 40*  CREATININE 0.93 0.96   < > 1.24 1.34* 1.28*  CALCIUM 8.2* 8.2*   < > 8.1* 7.5* 7.9*  PROT 5.6* 6.1*  --   --   --   --   ALBUMIN 1.7* 2.0*  --   --   --   --   AST 24 120*  --   --   --   --   ALT 6 39  --   --   --   --   ALKPHOS 79 98  --   --   --   --   BILITOT 1.0 1.3*  --   --   --   --   GFRNONAA >60 >60   < > 59* 54* 57*  GFRAA >  60 >60   < > >60 >60 >60  ANIONGAP 3* 7   < > 5 3* 4*   < > = values in this interval not displayed.     Hematology Recent Labs  Lab 02/05/19 2004 02/05/19 2231 02/06/19 0429  WBC 17.0* 15.0* 15.7*  RBC 2.95* 2.61* 2.98*  HGB 7.8* 7.2* 8.0*  HCT 24.3* 21.7* 24.6*  MCV 82.4 83.1 82.6  MCH 26.4 27.6 26.8  MCHC 32.1 33.2 32.5  RDW 19.2* 19.4* 18.5*  PLT 154 145* 146*    Cardiac Enzymes Recent Labs  Lab 02/03/19 0834 02/03/19 1416 02/03/19 2038  TROPONINI 0.20* 12.24* >65.00*   No results for input(s): TROPIPOC in the last 168 hours.   BNP Recent Labs  Lab 02/03/19 0834  BNP 408.1*     DDimer No results for input(s): DDIMER in the last 168 hours.   Radiology    No results found.  Cardiac Studies   Echo 02-05-2019: IMPRESSIONS    1. The left ventricle has hyperdynamic systolic function, with an ejection fraction of >65%. The cavity size was normal. There is moderate concentric left ventricular hypertrophy. Left ventricular diastolic function could not be evaluated secondary to  atrial fibrillation. No evidence of left ventricular regional wall motion abnormalities.  2. The right ventricle has mildly reduced systolic function. The cavity was normal. There is no increase  in right ventricular wall thickness. Right ventricular systolic pressure is severely elevated with an estimated pressure of 63.3 mmHg.  3. Left atrial size was severely dilated.  4. Right atrial size was severely dilated.  5. Small pericardial effusion.  6. The pericardial effusion is posterior to the left ventricle and the left atrium.  7. There is mild mitral annular calcification present.  8. Tricuspid valve regurgitation is mild-moderate.  9. A 26 an Edwards Edwards Sapien bioprosthetic aortic valve (TAVR) valve is present in the aortic position. Normal aortic valve prosthesis. 10. The pulmonic valve was grossly normal. Pulmonic valve regurgitation is mild to moderate is mild by color flow Doppler. 11. The inferior vena cava was dilated in size with <50% respiratory variability. 12. Possible small PFO. 13. There is right bowing of the interatrial septum, suggestive of elevated left atrial pressure.  SUMMARY   Pulmonary hypertension. Severe biatrial enlargement. TAVR valve appears stable, with gradient similar to prior. Moderate LV concentric hypertrophy with low tissue doppler velocities (cannot assess diastology due to afib) and septal bounce. Consistent with restrictive physiology, though constriction not excluded.  FINDINGS  Left Ventricle: The left ventricle has hyperdynamic systolic function, with an ejection fraction of >65%. The cavity size was normal. There is moderate concentric left ventricular hypertrophy. Left ventricular diastolic function could not be evaluated  secondary to atrial fibrillation. No evidence of left ventricular regional wall motion abnormalities.. Right Ventricle: The right ventricle has mildly reduced systolic function. The cavity was normal. There is no increase in right ventricular wall thickness. Right ventricular systolic pressure is severely elevated with an estimated pressure of 63.3 mmHg. Left Atrium: left atrial size was severely dilated Right  Atrium: right atrial size was severely dilated. Right atrial pressure is estimated at 15 mmHg. Interatrial Septum: Possible small PFO. There is right bowing of the interatrial septum, suggestive of elevated left atrial pressure. Pericardium: A small pericardial effusion is present. The pericardial effusion is posterior to the left ventricle and the left atrium. There is no evidence of cardiac tamponade. Mitral Valve: The mitral valve is normal in structure. There is mild mitral  annular calcification present. Mitral valve regurgitation is mild by color flow Doppler. Tricuspid Valve: The tricuspid valve is normal in structure. Tricuspid valve regurgitation is mild-moderate by color flow Doppler. Aortic Valve: The aortic valve has been repaired/replaced Aortic valve regurgitation was not assessed by color flow Doppler. A 26 Edwards Edwards Sapien bioprosthetic, stented aortic valve (TAVR) valve is present in the aortic position. Normal aortic  valve prosthesis. Pulmonic Valve: The pulmonic valve was grossly normal. Pulmonic valve regurgitation is mild to moderate is mild by color flow Doppler. No evidence of pulmonic stenosis. Venous: The inferior vena cava is dilated in size with less than 50% respiratory variability.  Cath 02/05/2019: Conclusion     Lat Ramus lesion is 99% stenosed. Heavy thrombus.  No other significant CAD.   D/w Dr. Excell Seltzer.  Restart IV heparin tonight and if Hbg stable, will restart Eliquis tomorrow.   Medical therapy for ramus disease.      Patient Profile     68 y.o. male with history of severe aortic stenosis and TAVR approximately 6 months ago.  He presents now with large non-STEMI troponin greater than 65 and V. fib cardiac arrest.  This occurred after recent cardioembolic stroke without clear source of embolism by TEE evaluation.  Assessment & Plan    1. Acute mental status change: the patient's mental status is clearly changed from my exam yesterday. He is  only answering with 'yes' and 'no' to questions. Appears to have an expressive aphasia this morning and is not following commands normally. He would not be a candidate for any acute stroke intervention with complex bleeding issues, recent large MI, etc. I have consulted neurology who will see him - spoke with Dr Amada Jupiter. I have ordered a stat CT of the brain. The patient's wife is contacted and informed of his change in symptoms and concern for recurrent stroke.   2. Acute GI bleed: received 1 unit PRBC's yesterday. Heparin stopped last night because of dark/maroon stool. I called GI this morning and they will see him in consultation. Stat H/H ordered.  3. Acute MI/NSTEMI: cath films reviewed. No recurrent chest pain. Likely embolic event. Not a candidate for anticoagulation at this time with ongoing symptoms of active GI bleed.   4. Permanent atrial fibrillation: rate controlled. Issues around anticoagulation as above.   The patient is critically ill with multiple organ systems failure and requires high complexity decision making for assessment and support, frequent evaluation and titration of therapies, application of advanced monitoring technologies and extensive interpretation of multiple databases.   Critical Care Time devoted to patient care services described in this note is 45 minutes   For questions or updates, please contact [facility] Please consult [email or web address] for contact info under        Signed, Tonny Bollman, MD  02/06/2019, 9:18 AM

## 2019-02-07 ENCOUNTER — Inpatient Hospital Stay (HOSPITAL_COMMUNITY): Payer: Medicare Other

## 2019-02-07 DIAGNOSIS — I8290 Acute embolism and thrombosis of unspecified vein: Secondary | ICD-10-CM

## 2019-02-07 DIAGNOSIS — G9340 Encephalopathy, unspecified: Secondary | ICD-10-CM

## 2019-02-07 DIAGNOSIS — G931 Anoxic brain damage, not elsewhere classified: Secondary | ICD-10-CM

## 2019-02-07 DIAGNOSIS — L8990 Pressure ulcer of unspecified site, unspecified stage: Secondary | ICD-10-CM | POA: Diagnosis present

## 2019-02-07 LAB — CBC
HCT: 26.1 % — ABNORMAL LOW (ref 39.0–52.0)
HCT: 26.7 % — ABNORMAL LOW (ref 39.0–52.0)
HEMOGLOBIN: 8.5 g/dL — AB (ref 13.0–17.0)
Hemoglobin: 8.5 g/dL — ABNORMAL LOW (ref 13.0–17.0)
MCH: 27.1 pg (ref 26.0–34.0)
MCH: 26.7 pg (ref 26.0–34.0)
MCHC: 32.6 g/dL (ref 30.0–36.0)
MCHC: 31.8 g/dL (ref 30.0–36.0)
MCV: 83.1 fL (ref 80.0–100.0)
MCV: 84 fL (ref 80.0–100.0)
Platelets: 188 10*3/uL (ref 150–400)
Platelets: 200 10*3/uL (ref 150–400)
RBC: 3.14 MIL/uL — ABNORMAL LOW (ref 4.22–5.81)
RBC: 3.18 MIL/uL — ABNORMAL LOW (ref 4.22–5.81)
RDW: 18.9 % — ABNORMAL HIGH (ref 11.5–15.5)
RDW: 19.4 % — AB (ref 11.5–15.5)
WBC: 16.4 10*3/uL — ABNORMAL HIGH (ref 4.0–10.5)
WBC: 16.3 10*3/uL — ABNORMAL HIGH (ref 4.0–10.5)
nRBC: 0.2 % (ref 0.0–0.2)
nRBC: 0.2 % (ref 0.0–0.2)

## 2019-02-07 LAB — MAGNESIUM
Magnesium: 1.9 mg/dL (ref 1.7–2.4)
Magnesium: 2.2 mg/dL (ref 1.7–2.4)

## 2019-02-07 LAB — COMPREHENSIVE METABOLIC PANEL
ALT: 21 U/L (ref 0–44)
AST: 39 U/L (ref 15–41)
Albumin: 2.2 g/dL — ABNORMAL LOW (ref 3.5–5.0)
Alkaline Phosphatase: 93 U/L (ref 38–126)
Anion gap: 7 (ref 5–15)
BUN: 34 mg/dL — ABNORMAL HIGH (ref 8–23)
CALCIUM: 8.7 mg/dL — AB (ref 8.9–10.3)
CO2: 24 mmol/L (ref 22–32)
Chloride: 110 mmol/L (ref 98–111)
Creatinine, Ser: 1.12 mg/dL (ref 0.61–1.24)
GFR calc Af Amer: >60 mL/min (ref >60)
Glucose, Bld: 110 mg/dL — ABNORMAL HIGH (ref 70–99)
Potassium: 4.4 mmol/L (ref 3.5–5.1)
Sodium: 141 mmol/L (ref 135–145)
TOTAL PROTEIN: 6.5 g/dL (ref 6.5–8.1)
Total Bilirubin: 1.3 mg/dL — ABNORMAL HIGH (ref 0.3–1.2)

## 2019-02-07 LAB — HEPARIN LEVEL (UNFRACTIONATED): Heparin Unfractionated: 0.18 IU/mL — ABNORMAL LOW (ref 0.30–0.70)

## 2019-02-07 LAB — PHOSPHORUS
Phosphorus: 3.1 mg/dL (ref 2.5–4.6)
Phosphorus: 3.1 mg/dL (ref 2.5–4.6)

## 2019-02-07 LAB — GLUCOSE, CAPILLARY
GLUCOSE-CAPILLARY: 112 mg/dL — AB (ref 70–99)
Glucose-Capillary: 110 mg/dL — ABNORMAL HIGH (ref 70–99)

## 2019-02-07 MED ORDER — VITAL HIGH PROTEIN PO LIQD: 1000.0000 mL | ORAL | Status: DC

## 2019-02-07 MED ORDER — PRO-STAT SUGAR FREE PO LIQD
30.0000 mL | Freq: Two times a day (BID) | ORAL | Status: DC
  Administered 2019-02-07 – 2019-02-11 (×7): 30 mL
  Filled 2019-02-07 (×7): qty 30

## 2019-02-07 MED ORDER — ORAL CARE MOUTH RINSE
15.0000 mL | Freq: Two times a day (BID) | OROMUCOSAL | Status: DC
  Administered 2019-02-07 – 2019-02-10 (×7): 15 mL via OROMUCOSAL

## 2019-02-07 MED ORDER — LIP MEDEX EX OINT
TOPICAL_OINTMENT | Freq: Two times a day (BID) | CUTANEOUS | Status: DC
  Administered 2019-02-07 – 2019-02-12 (×8): via TOPICAL
  Filled 2019-02-07: qty 7

## 2019-02-07 MED ORDER — FUROSEMIDE 10 MG/ML IJ SOLN
40.0000 mg | Freq: Once | INTRAMUSCULAR | Status: AC
  Administered 2019-02-07: 40 mg via INTRAVENOUS
  Filled 2019-02-07: qty 4

## 2019-02-07 MED ORDER — OSMOLITE 1.5 CAL PO LIQD
1000.0000 mL | ORAL | Status: DC
  Administered 2019-02-07 – 2019-02-10 (×3): 1000 mL
  Filled 2019-02-07 (×7): qty 1000

## 2019-02-07 MED ORDER — CHLORHEXIDINE GLUCONATE 0.12 % MT SOLN
15.0000 mL | Freq: Two times a day (BID) | OROMUCOSAL | Status: DC
  Administered 2019-02-07 – 2019-02-12 (×9): 15 mL via OROMUCOSAL
  Filled 2019-02-07 (×10): qty 15

## 2019-02-07 MED ORDER — HEPARIN (PORCINE) 25000 UT/250ML-% IV SOLN
1450.0000 [IU]/h | INTRAVENOUS | Status: DC
  Administered 2019-02-07: 1300 [IU]/h via INTRAVENOUS
  Administered 2019-02-08 – 2019-02-09 (×2): 1450 [IU]/h via INTRAVENOUS
  Filled 2019-02-07 (×3): qty 250

## 2019-02-07 MED ORDER — MAGNESIUM SULFATE 2 GM/50ML IV SOLN
2.0000 g | Freq: Once | INTRAVENOUS | Status: AC
  Administered 2019-02-07: 2 g via INTRAVENOUS
  Filled 2019-02-07: qty 50

## 2019-02-07 MED ORDER — ATORVASTATIN CALCIUM 80 MG PO TABS
80.0000 mg | ORAL_TABLET | Freq: Every day | ORAL | Status: DC
  Administered 2019-02-07 – 2019-02-09 (×3): 80 mg via ORAL
  Filled 2019-02-07 (×3): qty 1

## 2019-02-07 MED ORDER — AMLODIPINE BESYLATE 2.5 MG PO TABS
5.0000 mg | ORAL_TABLET | Freq: Every day | ORAL | Status: DC
  Administered 2019-02-07 – 2019-02-09 (×3): 5 mg via ORAL
  Filled 2019-02-07 (×2): qty 1
  Filled 2019-02-07: qty 2

## 2019-02-07 NOTE — Progress Notes (Signed)
[facility] Progress Note  Joel Rogers 68 y.o. [birth year]  CC:   GI bleed   HPI Patient remains confused.  Responding to verbal command.  Discussed with the wife and nursing staff.  No evidence of ongoing bleeding.  No black tarry stool.  ROS : Not able to obtain   Objective: Vital signs in last 24 hours: Vitals:   02/07/19 0800 02/07/19 0813  BP: (!) 166/110   Pulse:    Resp: 14   Temp:  97.8 F (36.6 C)  SpO2: 100%     Physical Exam:  General:   Confused but resting comfortably in the bed  Head:  Normocephalic, without obvious abnormality, atraumatic  Eyes:  , EOM's intact,   Lungs:   Clear to auscultation bilaterally, respirations unlabored  Heart:   Irregular rate with murmur  Abdomen:    Distended abdomen non-tender, bowel sounds active all four quadrants,  no masses,   Extremities: LE edema noted.        Lab Results: Recent Labs    02/06/19 0429 02/07/19 0425  NA 137 141  K 5.0 4.4  CL 111 110  CO2 22 24  GLUCOSE 96 110*  BUN 40* 34*  CREATININE 1.28* 1.12  CALCIUM 7.9* 8.7*   Recent Labs    02/07/19 0425  AST 39  ALT 21  ALKPHOS 93  BILITOT 1.3*  PROT 6.5  ALBUMIN 2.2*   Recent Labs    02/06/19 1050 02/07/19 0425  WBC 22.1* 16.4*  HGB 9.1* 8.5*  HCT 27.5* 26.1*  MCV 81.8 83.1  PLT 172 188   No results for input(s): LABPROT, INR in the last 72 hours.    Assessment/Plan: -GI bleed from ischemic anastomotic ulcer.  Had 3 EGDs earlier this month. -Cardioembolic events.  Left heart cath 02/05/2019 showed heavy thrombus. -Atrial fibrillation.  Anticoagulation on hold because of GI bleed  -Altered mental status.  Neurology following -Multiple medical comorbidities.  Recommendations ------------------------ -Continue Protonix drip. -Okay to start heparin drip from GI standpoint.  Recommend to hold aspirin once we start heparin drip or anticoagulation. -Monitor H&H.  Transfuse as needed  to keep hemoglobin around 8   -Neurology work-up pending. -GI will follow   Kathi Der MD, FACP 02/07/2019, 10:25 AM  Contact #  [phone]

## 2019-02-07 NOTE — Progress Notes (Signed)
Progress Note  Patient Name: Joel Rogers Date of Encounter: 02/07/2019  Primary Cardiologist: Nanetta Batty, MD   Subjective   The patient is unable to provide any meaningful history.  His wife is at the bedside.  He reportedly had a restless night.  Inpatient Medications    Scheduled Meds: . amLODipine  5 mg Oral Daily  . aspirin EC  81 mg Oral Daily  . atorvastatin  80 mg Oral q1800  . Chlorhexidine Gluconate Cloth  6 each Topical Q0600  . furosemide  40 mg Intravenous Once  . mouth rinse  15 mL Mouth Rinse BID  . metoprolol tartrate  50 mg Oral BID  . [START ON 02/09/2019] pantoprazole  40 mg Intravenous Q12H   Continuous Infusions: . sodium chloride    . heparin    . pantoprozole (PROTONIX) infusion 8 mg/hr (02/07/19 0914)   PRN Meds: sodium chloride, nitroGLYCERIN, ondansetron (ZOFRAN) IV, traMADol-acetaminophen   Vital Signs    Vitals:   02/07/19 0600 02/07/19 0700 02/07/19 0800 02/07/19 0813  BP: (!) 156/77 (!) 163/93 (!) 166/110   Pulse:      Resp: (!) 25 17 14    Temp:    97.8 F (36.6 C)  TempSrc:    Axillary  SpO2: 100% 100% 100%   Weight:      Height:        Intake/Output Summary (Last 24 hours) at 02/07/2019 2542 Last data filed at 02/07/2019 0800 Gross per 24 hour  Intake 489.21 ml  Output 1950 ml  Net -1460.79 ml   Last 3 Weights 02/07/2019 02/06/2019 02/05/2019  Weight (lbs) 258 lb 9.6 oz 270 lb 15.1 oz 274 lb 0.5 oz  Weight (kg) 117.3 kg 122.9 kg 124.3 kg      Telemetry    Atrial fibrillation, ventricular rate controlled- Personally Reviewed   Physical Exam  Elderly male, confused GEN: No acute distress.   Neck: No JVD Cardiac:  Irregularly irregular, no murmurs, rubs, or gallops.  Respiratory: Clear to auscultation bilaterally. GI: Soft, nontender, non-distended  MS:  2+ bilateral pedal edema; No deformity. Neuro:  Nonfocal but does not follow commands.  He does appear to move all 4 extremities  Labs    Chemistry Recent  Labs  Lab 02/03/19 0834  02/05/19 0506 02/06/19 0429 02/07/19 0425  NA 136   < > 136 137 141  K 3.0*   < > 5.0 5.0 4.4  CL 109   < > 110 111 110  CO2 20*   < > 23 22 24   GLUCOSE 140*   < > 98 96 110*  BUN 11   < > 24* 40* 34*  CREATININE 0.96   < > 1.34* 1.28* 1.12  CALCIUM 8.2*   < > 7.5* 7.9* 8.7*  PROT 6.1*  --   --   --  6.5  ALBUMIN 2.0*  --   --   --  2.2*  AST 120*  --   --   --  39  ALT 39  --   --   --  21  ALKPHOS 98  --   --   --  93  BILITOT 1.3*  --   --   --  1.3*  GFRNONAA >60   < > 54* 57* >60  GFRAA >60   < > >60 >60 >60  ANIONGAP 7   < > 3* 4* 7   < > = values in this interval not displayed.  Hematology Recent Labs  Lab 02/06/19 0429 02/06/19 1050 02/07/19 0425  WBC 15.7* 22.1* 16.4*  RBC 2.98* 3.36* 3.14*  HGB 8.0* 9.1* 8.5*  HCT 24.6* 27.5* 26.1*  MCV 82.6 81.8 83.1  MCH 26.8 27.1 27.1  MCHC 32.5 33.1 32.6  RDW 18.5* 18.5* 18.9*  PLT 146* 172 188    Cardiac Enzymes Recent Labs  Lab 02/03/19 0834 02/03/19 1416 02/03/19 2038  TROPONINI 0.20* 12.24* >65.00*   No results for input(s): TROPIPOC in the last 168 hours.   BNP Recent Labs  Lab 02/03/19 0834  BNP 408.1*     DDimer No results for input(s): DDIMER in the last 168 hours.   Radiology    Ct Head Wo Contrast  Result Date: 02/06/2019 CLINICAL DATA:  Not following commands appropriately. Altered level of consciousness. EXAM: CT HEAD WITHOUT CONTRAST TECHNIQUE: Contiguous axial images were obtained from the base of the skull through the vertex without intravenous contrast. COMPARISON:  MR head 01/19/2019. CT head 01/20/2019 FINDINGS: The patient was unable to remain motionless for the exam. Small or subtle lesions could be overlooked. Brain: No visible acute stroke. Generalized atrophy. Scattered areas of hypodensity in the cerebellum and cerebral hemispheres, consistent with subacute infarction. Resolving subarachnoid hemorrhage LEFT posterior frontal sulcus and dependent LEFT  sylvian fissure, improved from priors. No definite new areas of hemorrhage. Vascular: Calcification of the cavernous internal carotid arteries consistent with cerebrovascular atherosclerotic disease. No signs of intracranial large vessel occlusion. Skull: No fracture.  RIGHT frontal craniotomy/craniectomy. Sinuses/Orbits: No acute finding. Other: None. IMPRESSION: Within limits for assessment due to motion degraded exam, improving subarachnoid hemorrhage with no definite new areas of infarction. If further investigation warranted, and the patient is able to cooperate, recommend repeat MRI brain. Electronically Signed   By: Elsie Stain M.D.   On: 02/06/2019 10:32   Dg Abd Portable 1v  Result Date: 02/06/2019 CLINICAL DATA:  NG tube placement. EXAM: PORTABLE ABDOMEN - 1 VIEW COMPARISON:  None. FINDINGS: Nasogastric tube courses into the stomach and turns 180 degrees back on itself over the region of the gastroesophageal junction with tip just above the gastroesophageal junction. Bowel gas pattern is nonobstructive. Degenerative change of the spine. Partially visualized prosthetic aortic valve is present as well as partially visualized right hip arthroplasty. IMPRESSION: Nonobstructive bowel gas pattern. Nasogastric tube extends just below the gastroesophageal junction where it turns back 180 degrees on itself with tip just above the gastroesophageal junction. Electronically Signed   By: Elberta Fortis M.D.   On: 02/06/2019 12:45     Patient Profile     68 y.o. male with history of severe aortic stenosis and TAVR approximately 6 months ago. He presents now with large non-STEMI troponin greater than 65 and V. fib cardiac arrest. This occurred after recent cardioembolic stroke without clear source of embolism by TEE evaluation.  Assessment & Plan    1.  Stroke/altered mental status: The patient appears to have had multiple cardioembolic events during necessary interruption of anticoagulation with active  GI bleeding.  Appreciate neurology and GI evaluation.  We are going to start him on heparin without a bolus this morning.  If he tolerates and H/H remained stable, will consider transitioning him to apixaban tomorrow.  I think the best balance of antithrombotic and bleeding risk is probably to discontinue aspirin and treat him with apixaban alone at this point.  His TAVR bioprosthesis is functioning normally by TEE assessment and I suspect the most likely etiology of his cardioembolic events  is related to atrial fibrillation.  2.  Non-STEMI: Large infarct by enzyme assessment.  Noted to have thrombotic lesion in the ramus intermedius which is likely a coronary embolism.  As above, will anticipate treating with oral anticoagulation.  He previously had normal coronary arteries last year at the time of his pre-TAVR catheterization.  3.  GI bleed: Hemoglobin is stable this morning.  Low-dose heparin started.  Consider transition to apixaban tomorrow if okay with GI team.  I would anticipate stopping aspirin to reduce bleeding risk since he has had a recent large GI bleed.  4.  Permanent atrial fibrillation: As above.  Heart rate is controlled.  5.  Volume overload: We will treat with IV furosemide today.  6.  Marked hypertension: We will add amlodipine 5 mg.  He continues on metoprolol.  Will be cautious to avoid hypotension in the setting of his recent stroke.  Discussed tenuous prognosis with the patient's wife.  She understands the difficulty in managing his bleeding problems and recent MI/stroke.  The patient is critically ill with multiple organ systems failure and requires high complexity decision making for assessment and support, frequent evaluation and titration of therapies, application of advanced monitoring technologies and extensive interpretation of multiple databases.   Critical Care Time devoted to patient care services described in this note is 35 minutes  For questions or updates,  please contact [facility] Please consult [email or web address] for contact info under        Signed, Tonny Bollman, MD  02/07/2019, 9:21 AM

## 2019-02-07 NOTE — Procedures (Signed)
Cortrak  Person Inserting Tube:  Helane Rima E, RD Tube Type:  Cortrak - 43 inches Tube Location:  Right nare Initial Placement:  Stomach Secured by: Bridle Technique Used to Measure Tube Placement:  Documented cm marking at nare/ corner of mouth Cortrak Secured At:  62 cm    Cortrak Tube Team Note:  Consult received to place a Cortrak feeding tube.    X-ray is required, abdominal x-ray has been ordered by the Cortrak team. Please confirm tube placement before using the Cortrak tube.   If the tube becomes dislodged please keep the tube and contact the Cortrak team at [email or web address] ([email or web address]) for replacement.  If after hours and replacement cannot be delayed, place a NG tube and confirm placement with an abdominal x-ray.   Helane Rima, MS, RD, LDN Office: [phone] Pager: [phone] After Hours/Weekend Pager: [phone]

## 2019-02-07 NOTE — Progress Notes (Signed)
ANTICOAGULATION CONSULT NOTE  Pharmacy Consult for heparin Indication: atrial fibrillation  Allergies  Allergen Reactions  . Kayexalate [Polystyrene] Other (See Comments)    Recommended to avoid by GI due to ischemic gastropathy    Patient Measurements: Height: 6\' 1"  (185.4 cm) Weight: 258 lb 9.6 oz (117.3 kg) IBW/kg (Calculated) : 79.9 Heparin Dosing Weight: 106kg  Vital Signs: Temp: 97.6 F (36.4 C) (03/18 1534) Temp Source: Oral (03/18 1534) BP: 152/86 (03/18 1800)  Labs: Recent Labs    02/05/19 0505 02/05/19 0506  02/06/19 0429 02/06/19 1050 02/07/19 0425 02/07/19 1623 02/07/19 1704  HGB  --  7.2*   < > 8.0* 9.1* 8.5* 8.5*  --   HCT  --  22.5*   < > 24.6* 27.5* 26.1* 26.7*  --   PLT  --  142*   < > 146* 172 188 200  --   HEPARINUNFRC 0.38  --   --   --   --   --   --  0.18*  CREATININE  --  1.34*  --  1.28*  --  1.12  --   --    < > = values in this interval not displayed.    Estimated Creatinine Clearance: 84.7 mL/min (by C-G formula based on SCr of 1.12 mg/dL).   Medical History: Past Medical History:  Diagnosis Date  . Anemia    low iron  . Arthritis    bilateral knees  . Atrial fibrillation, chronic   . Chronic diastolic CHF (congestive heart failure) (HCC) 06/15/2018  . History of subdural hematoma   . Hyperlipidemia   . Hypertension   . Morbid obesity (HCC)   . Normal coronary arteries    by cardiac catheterization performed 03/14/06  . Pre-diabetes    pt denies  . S/P TAVR (transcatheter aortic valve replacement)    a. 07/25/18: Edwards Sapien 3 THV (size 26 mm, model # 9600TFX, serial # [id number]) by Dr. Laneta Simmers and Dr. Clifton James  . Sleep apnea    on CPAP  . Venous insufficiency     Medications:  Apixaban prior to admission  Assessment: 68 year old male originally admitted to [facility] with bacteremia and cellulitis. Found to have bilateral CVAs and small SAH. Also found blood and blood clot in stomach but unable to anticoagulate. Patient was  later transferred to CIR on 3/10. Patient was on apixaban prior to admission, d/t GIB this was never restarted during this admission.   Given melena in setting of recent stroke, will not bolus heparin infusion and will target reduced goal.   Level below goal tonight on 1300 units/hr, will increase rate.    Goal of Therapy:  Heparin level 0.3-0.5 units/ml Monitor platelets by anticoagulation protocol: Yes   Plan:  -Increase IV heparin to 1400 units/hr.  -Daily heparin level and CBC    Sheppard Coil PharmD., BCPS Clinical Pharmacist 02/07/2019 6:13 PM

## 2019-02-07 NOTE — Progress Notes (Signed)
ANTICOAGULATION CONSULT NOTE  Pharmacy Consult for heparin Indication: atrial fibrillation  Allergies  Allergen Reactions  . Kayexalate [Polystyrene] Other (See Comments)    Recommended to avoid by GI due to ischemic gastropathy    Patient Measurements: Height: 6\' 1"  (185.4 cm) Weight: 258 lb 9.6 oz (117.3 kg) IBW/kg (Calculated) : 79.9 Heparin Dosing Weight: 106kg  Vital Signs: Temp: 97.8 F (36.6 C) (03/18 0813) Temp Source: Axillary (03/18 0813) BP: 166/110 (03/18 0800)  Labs: Recent Labs    02/05/19 0505 02/05/19 0506  02/06/19 0429 02/06/19 1050 02/07/19 0425  HGB  --  7.2*   < > 8.0* 9.1* 8.5*  HCT  --  22.5*   < > 24.6* 27.5* 26.1*  PLT  --  142*   < > 146* 172 188  HEPARINUNFRC 0.38  --   --   --   --   --   CREATININE  --  1.34*  --  1.28*  --  1.12   < > = values in this interval not displayed.    Estimated Creatinine Clearance: 84.7 mL/min (by C-G formula based on SCr of 1.12 mg/dL).   Medical History: Past Medical History:  Diagnosis Date  . Anemia    low iron  . Arthritis    bilateral knees  . Atrial fibrillation, chronic   . Chronic diastolic CHF (congestive heart failure) (HCC) 06/15/2018  . History of subdural hematoma   . Hyperlipidemia   . Hypertension   . Morbid obesity (HCC)   . Normal coronary arteries    by cardiac catheterization performed 03/14/06  . Pre-diabetes    pt denies  . S/P TAVR (transcatheter aortic valve replacement)    a. 07/25/18: Edwards Sapien 3 THV (size 26 mm, model # 9600TFX, serial # [id number]) by Dr. Laneta Simmers and Dr. Clifton James  . Sleep apnea    on CPAP  . Venous insufficiency     Medications:  Apixaban prior to admission  Assessment: 68 year old male originally admitted to [facility] with bacteremia and cellulitis. Found to have bilateral CVAs and small SAH. Also found blood and blood clot in stomach but unable to anticoagulate. Patient was later transferred to CIR on 3/10. Patient was on apixaban prior to admission,  d/t GIB this was never restarted during this admission.   Plan to resume heparin post-cath on 3/16; however, Hgb down and was having melena. Received pRBCs with Hgb now stable at 8.5. No s/sx of bleeding. GI recommends if Hgb stable, okay to start low-dose heparin infusion. Neuro recommending MRI brain to quantify stroke - has not been able to be completed yet.   Given melena in setting of recent stroke, will not bolus heparin infusion and will target reduced goal.   Goal of Therapy:  Heparin level 0.3-0.5 units/ml Monitor platelets by anticoagulation protocol: Yes   Plan:  -Start heparin infusion at 1300 units/hr -Daily heparin level and CBC - monitor heparin level 6 hr after start  -F/u plan to restart apixaban  Sherron Monday, PharmD, BCCCP Clinical Pharmacist  Pager: [phone] Phone: [phone] Please check AMION for all [facility] Pharmacy numbers 02/07/2019

## 2019-02-07 NOTE — Progress Notes (Addendum)
NAME:  MICHELLE BOVA, MRN:  [id number], DOB:  1951/04/30, LOS: 4 ADMISSION DATE:  02/03/2019, CONSULTATION DATE:  01/05/2019 REFERRING MD:  Sharen Heck - CIR, CHIEF COMPLAINT:  Post cardiac arrest   HPI/course in hospital  68 year old man who sustained a VF cardiac arrest on CIR.  He complained of acid-like burning, retrosternal CP with no relief with Maalox. CPR with defibrillation x3 prior to ROSC. Patient awake and communicative post arrest.  Had been admitted to CIR following admission for right hemiparesis and staphylococcal bacteremia from cellulitis.  MRI consistent with multiple cardiobembolic strokes. No vegetation seen on TEE, TAVR valve intact.  Developed coffee ground emesis and hemodynamically significant UGIB requiring 6 units of blood. Eliquis and ASA held.  EGD: ischemic changes in gastric pouch. Anticoagulation placed on hold.  Improved to point he was transferred to CIR on 3/10. Was due to restart ASA and Eliquis on 3/16 while on rehab.  Past Medical History  Chronic AF  Chronic dCHF HLD HTN S/P TAVR OSA Obesity  Pre-diabetes   Studies  ECHO 3/16 >> LVEF 65%, RV mildly reduced systolic function, LA/RA severely dilated, bioprosthetic AV normal, possible small PFO (no mention of PFO on TEE 2/27), right bowing of interatrial septum suggestive of elevated left atrial pressure CT Head 3/17 >> motion degraded, improving SAH, no new areas of infarct  Cultures    Procedures  LHC 3/16 >> lat ramus lesion 99% stenosed, heavy thrombus, no other significant CAD  Events  3/14 Readmit to [facility] with VF cardiac arrest 3/16 Hgb dropped from ~9 to ~7. Remained HDS. Endorses chest pain with palpation.  Interim history/subjective:  Wife at bedside, reports he has been answering one word questions for her.   Objective   Blood pressure (!) 166/110, pulse 88, temperature 97.8 F (36.6 C), temperature source Axillary, resp. rate 14, height 6\' 1"  (1.854 m), weight 117.3 kg, SpO2 100 %.        Intake/Output Summary (Last 24 hours) at 02/07/2019 0851 Last data filed at 02/07/2019 0800 Gross per 24 hour  Intake 489.21 ml  Output 1950 ml  Net -1460.79 ml   Filed Weights   02/05/19 0500 02/06/19 0435 02/07/19 0444  Weight: 124.3 kg 122.9 kg 117.3 kg    Physical Exam: General: elderly male lying in bed in NAD HEENT: MM pink/moist, NGT in place Neuro: opens eyes to voice, answers one word questions, moves all extremities, able to state name CV: s1s2 rrr, no m/r/g PULM: even/non-labored, lungs bilaterally clear AC:ZYSA, non-tender, bsx4 active  Extremities: warm/dry, 2-3+ BLE pitting edema  Skin: no rashes or lesions  Ancillary tests (personally reviewed)  CBC: Recent Labs  Lab 02/05/19 2004 02/05/19 2231 02/06/19 0429 02/06/19 1050 02/07/19 0425  WBC 17.0* 15.0* 15.7* 22.1* 16.4*  HGB 7.8* 7.2* 8.0* 9.1* 8.5*  HCT 24.3* 21.7* 24.6* 27.5* 26.1*  MCV 82.4 83.1 82.6 81.8 83.1  PLT 154 145* 146* 172 188    Basic Metabolic Panel: Recent Labs  Lab 02/03/19 0834 02/03/19 1416 02/04/19 0445 02/05/19 0506 02/06/19 0429 02/07/19 0425  NA 136 136 136 136 137 141  K 3.0* 3.6 4.6 5.0 5.0 4.4  CL 109 109 109 110 111 110  CO2 20* 22 22 23 22 24   GLUCOSE 140* 108* 109* 98 96 110*  BUN 11 13 17  24* 40* 34*  CREATININE 0.96 1.10 1.24 1.34* 1.28* 1.12  CALCIUM 8.2* 8.1* 8.1* 7.5* 7.9* 8.7*  MG 1.7 1.9  --   --   --   --  GFR: Estimated Creatinine Clearance: 84.7 mL/min (by C-G formula based on SCr of 1.12 mg/dL). Recent Labs  Lab 02/03/19 0852 02/03/19 1416  02/05/19 2231 02/06/19 0429 02/06/19 1050 02/07/19 0425  WBC  --   --    < > 15.0* 15.7* 22.1* 16.4*  LATICACIDVEN 2.2* 1.0  --   --   --   --   --    < > = values in this interval not displayed.    Liver Function Tests: Recent Labs  Lab 02/03/19 0834 02/07/19 0425  AST 120* 39  ALT 39 21  ALKPHOS 98 93  BILITOT 1.3* 1.3*  PROT 6.1* 6.5  ALBUMIN 2.0* 2.2*   No results for input(s):  LIPASE, AMYLASE in the last 168 hours. No results for input(s): AMMONIA in the last 168 hours.  ABG    Component Value Date/Time   PHART 7.362 07/25/2018 0804   PCO2ART 45.6 07/25/2018 0804   PO2ART 333.0 (H) 07/25/2018 0804   HCO3 22.7 01/13/2019 1550   TCO2 25 07/25/2018 1018   ACIDBASEDEF 2.2 (H) 01/13/2019 1550   O2SAT 74.9 01/13/2019 1550     Coagulation Profile: Recent Labs  Lab 02/03/19 0834  INR 1.4*    Cardiac Enzymes: Recent Labs  Lab 02/03/19 0740 02/03/19 0834 02/03/19 1416 02/03/19 2038  CKTOTAL 131  --   --   --   CKMB 2.8  --   --   --   TROPONINI  --  0.20* 12.24* >65.00*    HbA1C: Hemoglobin A1C  Date/Time Value Ref Range Status  01/22/2016 6.2  Final   Hgb A1c MFr Bld  Date/Time Value Ref Range Status  07/21/2018 10:20 AM 5.8 (H) 4.8 - 5.6 % Final    Comment:    (NOTE) Pre diabetes:          5.7%-6.4% Diabetes:              >6.4% Glycemic control for   <7.0% adults with diabetes   11/26/2016 02:06 PM 5.6 <5.7 % Final    Comment:      For the purpose of screening for the presence of diabetes:   <5.7%       Consistent with the absence of diabetes 5.7-6.4 %   Consistent with increased risk for diabetes (prediabetes) >=6.5 %     Consistent with diabetes   This assay result is consistent with a decreased risk of diabetes.   Currently, no consensus exists regarding use of hemoglobin A1c for diagnosis of diabetes in children.   According to American Diabetes Association (ADA) guidelines, hemoglobin A1c <7.0% represents optimal control in non-pregnant diabetic patients. Different metrics may apply to specific patient populations. Standards of Medical Care in Diabetes (ADA).       CBG: No results for input(s): GLUCAP in the last 168 hours.   Assessment & Plan:   VF Cardiac Arrest NSTEMI Atrial fibrillation Severe aortic stenosis s/p TAVR P: Continue ASA, Beta Blocker  TTE as above > mention of small PFO but not identified on  TEE 2/27 ICU monitoring  Neurology rec's for MRI before restarting anticoagulation > defer to Neuro/GI/Cards Continue Lipitor   Altered Mental Status -? Additional hypoxic injury on top of initial injury from small CVA -note rise in BUN, ? Further bleeding P: Neurology following, appreciate input  Follow neuro exam  CT head as above  MRI recommended but do not see order > doubt he can remain still for procedure  Follow neuro exam  CT head  pending   Acute blood loss anemia No evidence of active bleeding. HDS. Recent UGIB secondary to necrosis and erosion in GJ anastomosis. P: PPI BID  Trend CBC  Anticoagulation per Cardiology, as above  Follow Hgb Transfuse per ICU guidelines  Continue ASA > once able to switch to eliquis, drop ASA (await Neuro input)  UGIB -necrosis and erosion in GJ anastomosis  P: GI following, appreciate input  Heparin as above  At Risk Malnutrition  -AMS, concern for aspiration risk P: NGT in place  Continue meds PT  Begin TF   Best practice:  Diet: NPO Pain/Anxiety/Delirium protocol (if indicated): prn only VAP protocol (if indicated): n/a DVT prophylaxis: therapeutic IV heparin GI prophylaxis: IV PPI Urinary catheter: Condom catheter  Glucose control: n/a Mobility: Bedrest with BR priv Code Status: Full Family Communication: Wife updated at bedside 3/18.  Disposition: ICU.      Critical care time: N/A    Canary Brim, NP-C [facility] Pgr: [phone] or if no answer [phone] 02/07/2019, 8:51 AM

## 2019-02-07 NOTE — Progress Notes (Signed)
EEG completed, results pending. 

## 2019-02-07 NOTE — TOC Initial Note (Addendum)
Transition of Care [facility]) - Initial/Assessment Note    Patient Details  Name: Joel Rogers MRN: [id number] Date of Birth: Mar 15, 1951  Transition of Care [facility]) CM/SW Contact:    Colleen Can RN, BSN, NCM-BC, ACM-RN [phone] Phone Number: 02/07/2019, 4:02 PM  Clinical Narrative:  68 yo presented from CIR after sustaining a VF cardiac arrest. Patient remains confused, with POC discussed with patients spouse, with request for the patient to return to CIR once stable. PT/OT eval pending. CM team will continue to follow for needs.           Expected Discharge Plan: IP Rehab Facility Barriers to Discharge: Continued Medical Work up   Patient Goals and CMS Choice Patient states their goals for this hospitalization and ongoing recovery are:: remains confused   Choice offered to / list presented to : Spouse  Expected Discharge Plan and Services Expected Discharge Plan: IP Rehab Facility Discharge Planning Services: CM Consult Post Acute Care Choice: IP Rehab Living arrangements for the past 2 months: Single Family Home                 DME Arranged: N/A DME Agency: NA HH Arranged: NA HH Agency: NA  Prior Living Arrangements/Services Living arrangements for the past 2 months: Single Family Home Lives with:: Self, Spouse Patient language and need for interpreter reviewed:: No        Need for Family Participation in Patient Care: Yes (Comment) Care giver support system in place?: Yes (comment) Current home services: DME Criminal Activity/Legal Involvement Pertinent to Current Situation/Hospitalization: No - Comment as needed  Activities of Daily Living Home Assistive Devices/Equipment: Electric scooter, Eyeglasses ADL Screening (condition at time of admission) Patient's cognitive ability adequate to safely complete daily activities?: Yes Is the patient deaf or have difficulty hearing?: No Does the patient have difficulty seeing, even when wearing glasses/contacts?: No Does  the patient have difficulty concentrating, remembering, or making decisions?: No Patient able to express need for assistance with ADLs?: Yes Does the patient have difficulty dressing or bathing?: Yes Independently performs ADLs?: No Communication: Independent Dressing (OT): Dependent Is this a change from baseline?: Change from baseline, expected to last >3 days Grooming: Dependent Is this a change from baseline?: Change from baseline, expected to last >3 days Feeding: Needs assistance Is this a change from baseline?: Change from baseline, expected to last >3 days Bathing: Dependent Is this a change from baseline?: Change from baseline, expected to last >3 days Toileting: Dependent Is this a change from baseline?: Change from baseline, expected to last >3days In/Out Bed: Dependent Is this a change from baseline?: Change from baseline, expected to last >3 days Walks in Home: Dependent Is this a change from baseline?: Change from baseline, expected to last >3 days Does the patient have difficulty walking or climbing stairs?: Yes Weakness of Legs: Both Weakness of Arms/Hands: Right  Permission Sought/Granted Permission sought to share information with : Case Manager                Emotional Assessment Appearance:: Appears stated age Attitude/Demeanor/Rapport: Unable to Assess Affect (typically observed): Unable to Assess(remains confused)   Alcohol / Substance Use: Not Applicable Psych Involvement: No (comment)  Admission diagnosis:  cardiac arriest Patient Active Problem List   Diagnosis Date Noted  . Pressure injury of skin 02/07/2019  . VT (ventricular tachycardia) (HCC)   . Acute coronary syndrome (HCC) 02/03/2019  . Acute bilateral cerebral infarction in a watershed distribution [facility]) 01/30/2019  . UGIB (upper gastrointestinal  bleed) 01/26/2019  . Bacteremia due to Streptococcus 01/26/2019  . Cellulitis 01/26/2019  . Upper GI bleed   . Acute CVA (cerebrovascular  accident) (HCC) 01/19/2019  . Acute renal failure (HCC) 01/14/2019  . Right shoulder pain 01/14/2019  . Sepsis (HCC) 01/13/2019  . S/P TAVR (transcatheter aortic valve replacement) 07/25/2018  . History of subdural hematoma   . Atrial fibrillation, chronic   . Acute on chronic diastolic heart failure (HCC) 06/15/2018  . Pulmonary hypertension, unspecified (HCC) 02/15/2018  . Varicose veins of right lower extremity with complications 01/10/2018  . History of Severe aortic stenosis 08/09/2017  . Vitamin D deficiency 12/02/2016  . Status post gastric bypass for obesity 11/02/2016  . HTN (hypertension) 04/13/2013  . Hyperlipemia 04/13/2013  . Chronic atrial fibrillation 02/14/2013  . Morbid obesity (HCC) 06/18/2008  . Obstructive sleep apnea 06/18/2008   PCP:  [name], MD Pharmacy:   [facility] [address], [name] - [address] [name] [address] [name] [address] [name] [address] Phone: [phone] Fax: [phone]  [facility] - [place], [place] - [address] [place] [address] Phone: [phone] Fax: [phone]     Social Determinants of Health (SDOH) Interventions    Readmission Risk Interventions 30 Day Unplanned Readmission Risk Score     Admission (Current) from 02/03/2019 in  2H CARDIOVASCULAR ICU  30 Day Unplanned Readmission Risk Score (%)  25 Filed at 02/07/2019 1200     This score is the patient's risk of an unplanned readmission within 30 days of being discharged (0 -100%). The score is based on dignosis, age, lab data, medications, orders, and past utilization.   Low:  0-14.9   Medium: 15-21.9   High: 22-29.9   Extreme: 30 and above       Readmission Risk Prevention Plan 02/07/2019 07/26/2018  Post Dischage Appt - Complete  Medication Screening - Complete  Transportation Screening Complete Complete  PCP follow-up - Complete  HRI or Home Care Consult Complete -  Palliative Care Screening  Not Applicable -  Medication Review (RN Care Manager) Complete -  Some recent data might be hidden

## 2019-02-07 NOTE — Procedures (Signed)
History: 68 year old male being evaluated for decreased speech  Sedation: None  Technique: This is a 21 channel routine scalp EEG performed at the bedside with bipolar and monopolar montages arranged in accordance to the international 10/20 system of electrode placement. One channel was dedicated to EKG recording.    Background: There is prominent generalized irregular slow activity throughout the study.  The posterior dominant rhythm is very low voltage, rarely seen poorly formed and poorly sustained.  It achieves a maximal frequency of 7 Hz.  There are occasional runs of frontally predominant intermittent rhythmic delta activity(FIRDA) as well as occasional triphasic waves.  Photic stimulation: Physiologic driving is not performed  EEG Abnormalities: 1) triphasic waves 2) FIRDA 3) generalized irregular slow activity 4) slow posterior dominant rhythm  Clinical Interpretation: This EEG is consistent with a moderate generalized nonspecific cerebral dysfunction (encephalopathy).  Triphasic waves, though nonspecific, are frequently associated with toxic/metabolic encephalopathies including hepatic encephalopathy.   There was no seizure or seizure predisposition recorded on this study. Please note that lack of epileptiform activity on EEG does not preclude the possibility of epilepsy.   Ritta Slot, MD [facility] [phone]  If 7pm- 7am, please page neurology on call as listed in AMION.

## 2019-02-07 NOTE — Progress Notes (Addendum)
Initial Nutrition Assessment  DOCUMENTATION CODES:   Morbid obesity, Non-severe (moderate) malnutrition in context of chronic illness  INTERVENTION:  Initiate TF via Cortrak:  Begin TF at 73mL/hr; titrate by 13mL Q 8hrs until goal rate.  Osmolite 1.5 @ Goal rate 54mL/hr (total ) 30 ml Prostat BID Free water flushes per MD recommendations TF formula regimen  provides 2180 kcal, 112.8g Pro., and 1003.84mL H2O, (meets 100% of needs).     NUTRITION DIAGNOSIS:   Moderate Malnutrition related to altered GI function, chronic illness(Gastric Bypass with multiple complications and CHF) as evidenced by mild fat depletion, edema, moderate muscle depletion.   GOAL:   Patient will meet greater than or equal to 90% of their needs   MONITOR:   TF tolerance, Labs, Weight trends, Diet advancement, I & O's, Skin  REASON FOR ASSESSMENT:   Consult Enteral/tube feeding initiation and management  ASSESSMENT:   Pt is a 74y M with PMH of diastolic CHF, aortic stenosis s/p TAVR September 2019, A.Fib (Eliquis), OSA-CPAP, HTN, and GI bleed (ischemic anastomatic ulcers), subdural hematoma. Pt sustained a VF cardiac arrest on CIR. Pt is now admitted for Acute coronary syndrome, multiple cardiobembolic stroke, and AMS.   Pt was receiving cortrak placement, verified placement with bedside x-ray. Pt was unable to provide any details, only able to respond yes or no. Pt has poor dentition and several sacral pressure injuries, stage II.   Pt current weight is 117.3kg admission weight is 122kg. Net negative ~5L of fluid, explaining weight loss. Pt is still with significant edema on exam. Unsure of pt's dry weight.   Pt has moderate malnutrition based off of NFPE findings and is at risk for refeeding.   Labs reviewed:  Corrected Ca: 10.14 (WDL)  Medications reviewed and include:  Prostat BID    NUTRITION - FOCUSED PHYSICAL EXAM:    Most Recent Value  Orbital Region  Mild depletion  Upper Arm  Region  Mild depletion  Thoracic and Lumbar Region  No depletion  Buccal Region  Mild depletion  Temple Region  Moderate depletion  Clavicle Bone Region  Moderate depletion  Clavicle and Acromion Bone Region  Moderate depletion  Scapular Bone Region  Severe depletion  Dorsal Hand  Unable to assess [Gloved]  Patellar Region  Unable to assess  Anterior Thigh Region  Unable to assess  Posterior Calf Region  Moderate depletion [Possibly severe, masked with Edema]  Edema (RD Assessment)  Severe  Hair  Reviewed  Eyes  Reviewed  Mouth  Reviewed  Skin  Reviewed  Nails  Unable to assess       Diet Order:   Diet Order    None      EDUCATION NEEDS:   Not appropriate for education at this time  Skin:  Skin Assessment: Skin Integrity Issues: Skin Integrity Issues:: Stage II Stage II: Sacrum; Medial;Left & Buttocks; Right;Lower    Last BM:  3/17  Height:   Ht Readings from Last 1 Encounters:  02/04/19 6\' 1"  (1.854 m)    Weight:   Wt Readings from Last 1 Encounters:  02/07/19 117.3 kg    Ideal Body Weight:  83.6 kg  BMI:  Body mass index is 34.12 kg/m.  Estimated Nutritional Needs:   Kcal:  2000-2300  Protein:  100-115 grams  Fluid:  1.5-2L or MD recommendations     Burnard Bunting, [facility] Dietetic Intern

## 2019-02-07 NOTE — Progress Notes (Addendum)
STROKE TEAM PROGRESS NOTE   INTERVAL HISTORY His wife and RN are at the bedside.  They both feel he has improved.  I have personally taken history of present illness from the patient and his wife  Vitals:   02/07/19 0600 02/07/19 0700 02/07/19 0800 02/07/19 0813  BP: (!) 156/77 (!) 163/93 (!) 166/110   Pulse:      Resp: (!) 25 17 14    Temp:    97.8 F (36.6 C)  TempSrc:    Axillary  SpO2: 100% 100% 100%   Weight:      Height:        CBC:  Recent Labs  Lab 02/06/19 1050 02/07/19 0425  WBC 22.1* 16.4*  HGB 9.1* 8.5*  HCT 27.5* 26.1*  MCV 81.8 83.1  PLT 172 188    Basic Metabolic Panel:  Recent Labs  Lab 02/03/19 0834 02/03/19 1416  02/06/19 0429 02/07/19 0425  NA 136 136   < > 137 141  K 3.0* 3.6   < > 5.0 4.4  CL 109 109   < > 111 110  CO2 20* 22   < > 22 24  GLUCOSE 140* 108*   < > 96 110*  BUN 11 13   < > 40* 34*  CREATININE 0.96 1.10   < > 1.28* 1.12  CALCIUM 8.2* 8.1*   < > 7.9* 8.7*  MG 1.7 1.9  --   --   --    < > = values in this interval not displayed.   Lipid Panel:     Component Value Date/Time   CHOL 104 02/04/2019 0445   TRIG 53 02/04/2019 0445   HDL 35 (L) 02/04/2019 0445   CHOLHDL 3.0 02/04/2019 0445   VLDL 11 02/04/2019 0445   LDLCALC 58 02/04/2019 0445   LDLCALC 70 05/01/2018 0913   HgbA1c:  Lab Results  Component Value Date   HGBA1C 5.8 (H) 07/21/2018   Urine Drug Screen: No results found for: LABOPIA, COCAINSCRNUR, LABBENZ, AMPHETMU, THCU, LABBARB  Alcohol Level No results found for: ETH  IMAGING Ct Head Wo Contrast  Result Date: 02/06/2019 CLINICAL DATA:  Not following commands appropriately. Altered level of consciousness. EXAM: CT HEAD WITHOUT CONTRAST TECHNIQUE: Contiguous axial images were obtained from the base of the skull through the vertex without intravenous contrast. COMPARISON:  MR head 01/19/2019. CT head 01/20/2019 FINDINGS: The patient was unable to remain motionless for the exam. Small or subtle lesions could be  overlooked. Brain: No visible acute stroke. Generalized atrophy. Scattered areas of hypodensity in the cerebellum and cerebral hemispheres, consistent with subacute infarction. Resolving subarachnoid hemorrhage LEFT posterior frontal sulcus and dependent LEFT sylvian fissure, improved from priors. No definite new areas of hemorrhage. Vascular: Calcification of the cavernous internal carotid arteries consistent with cerebrovascular atherosclerotic disease. No signs of intracranial large vessel occlusion. Skull: No fracture.  RIGHT frontal craniotomy/craniectomy. Sinuses/Orbits: No acute finding. Other: None. IMPRESSION: Within limits for assessment due to motion degraded exam, improving subarachnoid hemorrhage with no definite new areas of infarction. If further investigation warranted, and the patient is able to cooperate, recommend repeat MRI brain. Electronically Signed   By: Elsie Stain M.D.   On: 02/06/2019 10:32   Dg Abd Portable 1v  Result Date: 02/06/2019 CLINICAL DATA:  NG tube placement. EXAM: PORTABLE ABDOMEN - 1 VIEW COMPARISON:  None. FINDINGS: Nasogastric tube courses into the stomach and turns 180 degrees back on itself over the region of the gastroesophageal junction with tip just above  the gastroesophageal junction. Bowel gas pattern is nonobstructive. Degenerative change of the spine. Partially visualized prosthetic aortic valve is present as well as partially visualized right hip arthroplasty. IMPRESSION: Nonobstructive bowel gas pattern. Nasogastric tube extends just below the gastroesophageal junction where it turns back 180 degrees on itself with tip just above the gastroesophageal junction. Electronically Signed   By: Elberta Fortis M.D.   On: 02/06/2019 12:45    PHYSICAL EXAM Pleasant middle-aged African-American gentleman not in distress. . Afebrile. Head is nontraumatic. Neck is supple without bruit.    Cardiac exam no murmur or gallop. Lungs are clear to auscultation. Distal pulses  are well felt. Neurological Exam : Awake alert oriented to person only.  Speaks only if single words and short sentences.  Diminished attention, registration and recall.  Follows only simple midline and one-step commands.  Extraocular movements are full range without nystagmus.  Blinks to threat bilaterally.  Pupils equal reactive.  Fundi not visualized.  Right lower facial weakness.  Tongue midline.  Motor system exam reveals right hemiplegia with grade 2-3/5 strength.  Mild weakness on the left side but effort is variable and poor.  Deep tendon reflexes are depressed on the right and elicitable on the left.  Right plantar upgoing left downgoing.  Gait not tested. ASSESSMENT/PLAN [name] is a 68 y.o. male with history of recent embolic stroke d/t AF put who was d/c to IP rehab where he had VFib arrest preceded by CP in setting of GIB while off aspirin and eliquis, underwent successful CPR and transferred back to acute. During prior admission, he also has bacteremia from cellulitis with neg TEE, TAVR intact. Significant UGIB required 6u transfusion with plans to restart eliquis and aspirin on CIR  Encephalopathy  EEG triphasic waves, FIRDA, generalized irregular slow activity, slow posterior dominant rhythm. No seizure  Stroke:  No new stroke likely hypoxic ischemic encephalopathy related to cardiac arrest .  Small left frontal subarachnoid hemorrhage likely related to periprocedural stroke following cardiac cath. Stroke:  Recent multiple bilateral anterior and posterior circulation infarcts,  right left:[id number]}  infarct embolic secondary to AF  CT head no new infarcts. improving SAH  Previous admission, last CT multi foci infarct scattered anterior and posterior circulation. Stable L central sulcus SAH.   aspirin 81 mg daily and Eliquis (apixaban) daily prior to admission, ok to start heparin IV per Dr Pearlean Brownie. consider eliquis once able to swallow  Therapy recommendations:  pending -  PT ordered, will add OT   Disposition:  pending    Chronic Atrial Fibrillation  Home anticoagulation:  heparin IV ok'd today by Dr. Pearlean Brownie . Change to Eliquis (apixaban) daily once stable from GI standpoint and ok from cardiac standpoint   Dysphagia  Has panda/coretrak w/ TF  For SLP assessment  Marked Hypertension  BP goal normotensive from stroke standpoint  Hyperlipidemia  Home meds:  lipitor 80, resumed in hospital  LDL 58, at goal < 70  Continue statin at discharge  Diabetes type II  HgbA1c goal < 7.0  Other Stroke Risk Factors  Advanced age  Former Cigarette smoker  Obesity, Body mass index is 34.12 kg/m., recommend weight loss, diet and exercise as appropriate   Hx stroke/TIA  01/19/2019 - Multifocal infarcts throughout the cerebrum and cerebellum - embolic from afib  Coronary artery disease, NSTEMI  Severe AS s/p TAVR  obstructive sleep apnea , on CPAP at home  Other Active Problems  VF cardiac arrest  UGIB from ischemic anastomotic ulcer -  GI ok'd IV heparin  Volume overload  Acute blood loss anemia  Hospital day # 4  Annie Main, MSN, APRN, ANVP-BC, AGPCNP-BC Advanced Practice Stroke Nurse [facility] See Amion for Schedule & Pager information 02/07/2019 3:28 PM  I have personally obtained history,examined this patient, reviewed notes, independently viewed imaging studies, participated in medical decision making and plan of care.ROS completed by me personally and pertinent positives fully documented  I have made any additions or clarifications directly to the above note. Agree with note above.  Patient developed cardiac arrest requiring resuscitation and has developed altered mental status which likely represents hypoxic ischemic encephalopathy superimposed upon his recent stroke and right hemiplegia.  Small left frontal subarachnoid hemorrhage likely from a small periprocedural stroke related to cardiac cath.  Patient is at high  risk for recurrent thromboembolism given history of atrial fibrillation and cardiology and GI feel he is safe anticoagulation hence recommend IV heparin drip but without bolus and aim for heparin level in the low therapeutic range.  Long discussion with the patient's wife at the bedside about risk benefit of anticoagulation in the setting of small subarachnoid hemorrhage and recent GI hemorrhage and answered questions.  Consider transition to Eliquis when patient is able to swallow safely.  Discussed with critical care team physician assistant and pharmacist.This patient is critically ill and at significant risk of neurological worsening, death and care requires constant monitoring of vital signs, hemodynamics,respiratory and cardiac monitoring, extensive review of multiple databases, frequent neurological assessment, discussion with family, other specialists and medical decision making of high complexity.I have made any additions or clarifications directly to the above note.This critical care time does not reflect procedure time, or teaching time or supervisory time of PA/NP/Med Resident etc but could involve care discussion time.  I spent 30 minutes of neurocritical care time  in the care of  this patient.  Stroke team will sign off.  Kindly call for questions if any.      Delia Heady, MD Medical Director [facility] Pager: [phone] 02/07/2019 4:09 PM  To contact Stroke Continuity provider, please refer to [email or web address]. After hours, contact General Neurology

## 2019-02-08 DIAGNOSIS — I5031 Acute diastolic (congestive) heart failure: Secondary | ICD-10-CM

## 2019-02-08 DIAGNOSIS — E440 Moderate protein-calorie malnutrition: Secondary | ICD-10-CM | POA: Diagnosis present

## 2019-02-08 LAB — GLUCOSE, CAPILLARY
GLUCOSE-CAPILLARY: 116 mg/dL — AB (ref 70–99)
Glucose-Capillary: 120 mg/dL — ABNORMAL HIGH (ref 70–99)
Glucose-Capillary: 115 mg/dL — ABNORMAL HIGH (ref 70–99)
Glucose-Capillary: 148 mg/dL — ABNORMAL HIGH (ref 70–99)

## 2019-02-08 LAB — BASIC METABOLIC PANEL
ANION GAP: 5 (ref 5–15)
BUN: 31 mg/dL — ABNORMAL HIGH (ref 8–23)
CO2: 24 mmol/L (ref 22–32)
Calcium: 8.3 mg/dL — ABNORMAL LOW (ref 8.9–10.3)
Chloride: 113 mmol/L — ABNORMAL HIGH (ref 98–111)
Creatinine, Ser: 0.97 mg/dL (ref 0.61–1.24)
GFR calc non Af Amer: >60 mL/min (ref >60)
Glucose, Bld: 133 mg/dL — ABNORMAL HIGH (ref 70–99)
Potassium: 3.6 mmol/L (ref 3.5–5.1)
Sodium: 142 mmol/L (ref 135–145)

## 2019-02-08 LAB — CBC
HCT: 24.7 % — ABNORMAL LOW (ref 39.0–52.0)
HEMOGLOBIN: 8.2 g/dL — AB (ref 13.0–17.0)
MCH: 27.9 pg (ref 26.0–34.0)
MCHC: 33.2 g/dL (ref 30.0–36.0)
MCV: 84 fL (ref 80.0–100.0)
Platelets: 196 10*3/uL (ref 150–400)
RBC: 2.94 MIL/uL — ABNORMAL LOW (ref 4.22–5.81)
RDW: 19.7 % — ABNORMAL HIGH (ref 11.5–15.5)
WBC: 15.5 10*3/uL — AB (ref 4.0–10.5)
nRBC: 0.3 % — ABNORMAL HIGH (ref 0.0–0.2)

## 2019-02-08 LAB — MAGNESIUM: MAGNESIUM: 2 mg/dL (ref 1.7–2.4)

## 2019-02-08 LAB — PHOSPHORUS: Phosphorus: 2.8 mg/dL (ref 2.5–4.6)

## 2019-02-08 LAB — HEPARIN LEVEL (UNFRACTIONATED)
HEPARIN UNFRACTIONATED: 0.32 [IU]/mL (ref 0.30–0.70)
Heparin Unfractionated: 0.35 IU/mL (ref 0.30–0.70)

## 2019-02-08 MED ORDER — MORPHINE SULFATE (PF) 2 MG/ML IV SOLN
2.0000 mg | INTRAVENOUS | Status: DC | PRN
  Administered 2019-02-08: 2 mg via INTRAVENOUS
  Filled 2019-02-08: qty 1

## 2019-02-08 NOTE — Progress Notes (Signed)
Progress Note  Patient Name: Joel Rogers Date of Encounter: 02/08/2019  Primary Cardiologist: Nanetta Batty, MD   Subjective   Feeling better today.  Denies chest pain or shortness of breath.  Inpatient Medications    Scheduled Meds: . amLODipine  5 mg Oral Daily  . atorvastatin  80 mg Oral q1800  . chlorhexidine  15 mL Mouth Rinse BID  . Chlorhexidine Gluconate Cloth  6 each Topical Q0600  . feeding supplement (PRO-STAT SUGAR FREE 64)  30 mL Per Tube BID  . lip balm   Topical BID  . mouth rinse  15 mL Mouth Rinse BID  . mouth rinse  15 mL Mouth Rinse q12n4p  . metoprolol tartrate  50 mg Oral BID  . [START ON 02/09/2019] pantoprazole  40 mg Intravenous Q12H   Continuous Infusions: . sodium chloride 250 mL (02/07/19 1221)  . feeding supplement (OSMOLITE 1.5 CAL) 30 mL/hr at 02/08/19 0100  . heparin 1,450 Units/hr (02/08/19 1000)  . pantoprozole (PROTONIX) infusion 8 mg/hr (02/08/19 1000)   PRN Meds: sodium chloride, nitroGLYCERIN, ondansetron (ZOFRAN) IV, traMADol-acetaminophen   Vital Signs    Vitals:   02/08/19 0700 02/08/19 0800 02/08/19 0900 02/08/19 1000  BP: (!) 145/90 (!) 152/87  131/78  Pulse:  78    Resp: (!) 23 (!) 24 (!) 23 12  Temp:  98 F (36.7 C)    TempSrc:  Oral    SpO2: 100% 100% 100% 100%  Weight:      Height:        Intake/Output Summary (Last 24 hours) at 02/08/2019 1104 Last data filed at 02/08/2019 1000 Gross per 24 hour  Intake 1245.2 ml  Output 4875 ml  Net -3629.8 ml   Last 3 Weights 02/08/2019 02/07/2019 02/06/2019  Weight (lbs) 251 lb 12.3 oz 258 lb 9.6 oz 270 lb 15.1 oz  Weight (kg) 114.2 kg 117.3 kg 122.9 kg      Telemetry    Atrial fibrillation, heart rate controlled - Personally Reviewed   Physical Exam  Alert, oriented male GEN: No acute distress.   Neck: No JVD Cardiac:  Irregularly irregular with grade 2/6 systolic murmur at the left lower sternal border Respiratory: Clear to auscultation bilaterally. GI: Soft,  nontender, non-distended  MS:  2+ bilateral pedal edema; No deformity. Neuro:  Nonfocal  Psych: Normal affect   Labs    Chemistry Recent Labs  Lab 02/03/19 0834  02/06/19 0429 02/07/19 0425 02/08/19 0400  NA 136   < > 137 141 142  K 3.0*   < > 5.0 4.4 3.6  CL 109   < > 111 110 113*  CO2 20*   < > 22 24 24   GLUCOSE 140*   < > 96 110* 133*  BUN 11   < > 40* 34* 31*  CREATININE 0.96   < > 1.28* 1.12 0.97  CALCIUM 8.2*   < > 7.9* 8.7* 8.3*  PROT 6.1*  --   --  6.5  --   ALBUMIN 2.0*  --   --  2.2*  --   AST 120*  --   --  39  --   ALT 39  --   --  21  --   ALKPHOS 98  --   --  93  --   BILITOT 1.3*  --   --  1.3*  --   GFRNONAA >60   < > 57* >60 >60  GFRAA >60   < > >60 >60 >  60  ANIONGAP 7   < > 4* 7 5   < > = values in this interval not displayed.     Hematology Recent Labs  Lab 02/07/19 0425 02/07/19 1623 02/08/19 0400  WBC 16.4* 16.3* 15.5*  RBC 3.14* 3.18* 2.94*  HGB 8.5* 8.5* 8.2*  HCT 26.1* 26.7* 24.7*  MCV 83.1 84.0 84.0  MCH 27.1 26.7 27.9  MCHC 32.6 31.8 33.2  RDW 18.9* 19.4* 19.7*  PLT 188 200 196    Cardiac Enzymes Recent Labs  Lab 02/03/19 0834 02/03/19 1416 02/03/19 2038  TROPONINI 0.20* 12.24* >65.00*   No results for input(s): TROPIPOC in the last 168 hours.   BNP Recent Labs  Lab 02/03/19 0834  BNP 408.1*     DDimer No results for input(s): DDIMER in the last 168 hours.   Radiology    Dg Abd Portable 1v  Result Date: 02/07/2019 CLINICAL DATA:  Check feeding catheter placement EXAM: PORTABLE ABDOMEN - 1 VIEW COMPARISON:  None. FINDINGS: Feeding catheter is noted within the stomach. Cardiac shadow is enlarged. Changes of prior TAVR are noted. No other focal abnormality is seen. IMPRESSION: Feeding catheter within the stomach. Electronically Signed   By: Alcide CleverMark  Lukens M.D.   On: 02/07/2019 12:13   Dg Abd Portable 1v  Result Date: 02/06/2019 CLINICAL DATA:  NG tube placement. EXAM: PORTABLE ABDOMEN - 1 VIEW COMPARISON:  None.  FINDINGS: Nasogastric tube courses into the stomach and turns 180 degrees back on itself over the region of the gastroesophageal junction with tip just above the gastroesophageal junction. Bowel gas pattern is nonobstructive. Degenerative change of the spine. Partially visualized prosthetic aortic valve is present as well as partially visualized right hip arthroplasty. IMPRESSION: Nonobstructive bowel gas pattern. Nasogastric tube extends just below the gastroesophageal junction where it turns back 180 degrees on itself with tip just above the gastroesophageal junction. Electronically Signed   By: Elberta Fortisaniel  Boyle M.D.   On: 02/06/2019 12:45     Patient Profile     68 y.o. male with history of severe aortic stenosis and TAVR approximately 6 months ago. He presents now with large non-STEMI troponin greater than 65 and V. fib cardiac arrest. This occurred after recent cardioembolic stroke without clear source of embolism by TEE evaluation.  Assessment & Plan    1.  Stroke/altered mental status: Appreciate neurology evaluation.  The patient demonstrates marked improvement today compared to his exam over the past 2 days.  Aspirin is discontinued.  He remains on IV heparin.  Plan to transition him to Eliquis tomorrow (see discussion below)  2.  Non-STEMI: Thrombotic event, likely embolic in nature.  Anticoagulation as above.  3.  Gastrointestinal bleed: Patient noted to have upper GI bleed, GI following.  Recommendations to start low-dose oral anticoagulation tomorrow in order to test him and make sure he can tolerate without major bleeding.  Anticipate using apixaban 2.5 mg twice daily x48 hours then transitioning him to apixaban 5 mg twice daily thereafter.  We will continue IV heparin today and repeat a hemoglobin tomorrow morning before making the change.  4.  Permanent atrial fibrillation: Heart rate is controlled on current medical therapy.  5.  Acute diastolic heart failure: Received IV furosemide  yesterday.  Still has some evidence of volume overload.  Will transition him to oral furosemide today.  6.  Disposition: I think he is stable to transfer out of the ICU today.  We will transfer him to a cardiac telemetry bed.  PT assessment  reviewed with recommendation for [facility] inpatient rehab.  Will request evaluation once he is to the floor.  For questions or updates, please contact [facility] Please consult [email or web address] for contact info under        Signed, Tonny Bollman, MD  02/08/2019, 11:04 AM

## 2019-02-08 NOTE — Progress Notes (Addendum)
[facility] Gastroenterology Progress Note  Joel Rogers 68 y.o. [birth year]  CC:   GI bleed   HPI Patient more alert today.  Wife at bedside.  No further bleeding episodes.  Hemoglobin stable on heparin drip.  ROS : Not able to obtain   Objective: Vital signs in last 24 hours: Vitals:   02/08/19 0900 02/08/19 1000  BP:  131/78  Pulse:    Resp: (!) 23 12  Temp:    SpO2: 100% 100%    Physical Exam:  General:   More alert today.  Head:  Normocephalic, without obvious abnormality, atraumatic  Eyes:  , EOM's intact,   Lungs:   Clear to auscultation bilaterally, respirations unlabored  Heart:   Irregular rate with murmur  Abdomen:    Distended abdomen non-tender, bowel sounds active all four quadrants,  no masses,   Extremities: LE edema noted.        Lab Results: Recent Labs    02/07/19 0425  02/07/19 1623 02/08/19 0400  NA 141  --   --  142  K 4.4  --   --  3.6  CL 110  --   --  113*  CO2 24  --   --  24  GLUCOSE 110*  --   --  133*  BUN 34*  --   --  31*  CREATININE 1.12  --   --  0.97  CALCIUM 8.7*  --   --  8.3*  MG  --    < > 2.2 2.0  PHOS  --    < > 3.1 2.8   < > = values in this interval not displayed.   Recent Labs    02/07/19 0425  AST 39  ALT 21  ALKPHOS 93  BILITOT 1.3*  PROT 6.5  ALBUMIN 2.2*   Recent Labs    02/07/19 1623 02/08/19 0400  WBC 16.3* 15.5*  HGB 8.5* 8.2*  HCT 26.7* 24.7*  MCV 84.0 84.0  PLT 200 196   No results for input(s): LABPROT, INR in the last 72 hours.    Assessment/Plan: -GI bleed from ischemic anastomotic ulcer.  Had 3 EGDs earlier this month. -Cardioembolic events.  Left heart cath 02/05/2019 showed heavy thrombus. -Atrial fibrillation.  on heparin drip. -Altered mental status. -Multiple medical comorbidities.  Recommendations ------------------------ -Continue Protonix drip.  Will be switched to IV PPI twice daily tomorrow. -Evidence of bleeding while on heparin drip.  Aspirin on hold.  Okay to start  with low-dose oral anticoagulation tomorrow and increase anticoagulation to therapeutic dose if no further bleeding. - D/W Dr. Excell Seltzer.  -Discussed with the family at bedside.  GI will sign off.  Call us back if needed    Kathi Der MD, FACP 02/08/2019, 10:50 AM  Contact #  [phone]

## 2019-02-08 NOTE — Progress Notes (Signed)
ANTICOAGULATION CONSULT NOTE  Pharmacy Consult for heparin Indication: atrial fibrillation  Allergies  Allergen Reactions  . Kayexalate [Polystyrene] Other (See Comments)    Recommended to avoid by GI due to ischemic gastropathy    Patient Measurements: Height: 6\' 1"  (185.4 cm) Weight: 258 lb 9.6 oz (117.3 kg) IBW/kg (Calculated) : 79.9 Heparin Dosing Weight: 106kg  Vital Signs: Temp: 97.7 F (36.5 C) (03/19 0000) Temp Source: Axillary (03/19 0000) BP: 165/96 (03/19 0000) Pulse Rate: 93 (03/18 2203)  Labs: Recent Labs    02/05/19 0505 02/05/19 0506  02/06/19 0429 02/06/19 1050 02/07/19 0425 02/07/19 1623 02/07/19 1704 02/08/19 0200  HGB  --  7.2*   < > 8.0* 9.1* 8.5* 8.5*  --   --   HCT  --  22.5*   < > 24.6* 27.5* 26.1* 26.7*  --   --   PLT  --  142*   < > 146* 172 188 200  --   --   HEPARINUNFRC 0.38  --   --   --   --   --   --  0.18* 0.32  CREATININE  --  1.34*  --  1.28*  --  1.12  --   --   --    < > = values in this interval not displayed.    Estimated Creatinine Clearance: 84.7 mL/min (by C-G formula based on SCr of 1.12 mg/dL).   Assessment: 68 year old male originally admitted to [facility] with bacteremia and cellulitis. Found to have bilateral CVAs and small SAH. Also found blood and blood clot in stomach but unable to anticoagulate. Patient was later transferred to CIR on 3/10. Patient was on apixaban prior to admission, d/t GIB this was never restarted during this admission.   Given melena in setting of recent stroke, will not bolus heparin infusion and will target reduced goal.   Level therapeutic (0.32) on gtt at 1400 units/hr.   Goal of Therapy:  Heparin level 0.3-0.5 units/ml Monitor platelets by anticoagulation protocol: Yes   Plan:  -Continue IV heparin at 1400 units/hr.  -Daily heparin level and CBC   Christoper Fabian, PharmD, BCPS Clinical pharmacist  **Pharmacist phone directory can now be found on [email or web address] ([email or web address]).  Listed under [facility]  Pharmacy. 02/08/2019 2:37 AM

## 2019-02-08 NOTE — Progress Notes (Signed)
Occupational Therapy Evaluation Patient Details Name: Joel Rogers MRN: [id number] DOB: Apr 03, 1951 Today's Date: 02/08/2019  Clinical Impression: PTA Pt at CIR working on independence in ADL and mobility. Pt is currently mod A +2 to max A +2 for bed mobility. NPO, but able to bring face to mouth. Pt is total A for LB ADL, max A +2 to come to squat position, overall mod to max A due to hemiplegia and cognitive deficits for ADL. Pt will require skilled OT in the acute setting and afterwards at the CIR level. Next session to focus on continued independence in bed mobility and balance EOB as precursor to OOB and establishing HEP for RUE (gross and fine motor).    02/08/19 1200  OT Visit Information  Last OT Received On 02/08/19  Assistance Needed +2  PT/OT/SLP Co-Evaluation/Treatment Yes  Reason for Co-Treatment Complexity of the patient's impairments (multi-system involvement);Necessary to address cognition/behavior during functional activity;For patient/therapist safety;To address functional/ADL transfers  PT goals addressed during session Mobility/safety with mobility;Balance;Strengthening/ROM  OT goals addressed during session ADL's and self-care;Strengthening/ROM  History of Present Illness Pt is a 68 year old man who sustained a VF cardiac arrest on CIR 02/04/19. CPR with defibrillation x3 prior to ROSC. He was previously admitted 01/13/19 due to RUE/LE pain with sepsis and cellulitis. During that admission, MRI revealed multi acute ischemic infarcts. PMH consists of a fib, CHF, h/o subdural hematoma, HTN, and TAVR placement 07/2018. He was on CIR 01/30/19 to 02/04/19.   Precautions  Precautions Fall;Other (comment)  Precaution Comments cortrak   Restrictions  Weight Bearing Restrictions No  Home Living  Family/patient expects to be discharged to: Private residence  Living Arrangements Spouse/significant other  Available Help at Discharge Family;Available 24 hours/day  Type of Home House   Home Access Stairs to enter  Entrance Stairs-Number of Steps 3  Entrance Stairs-Rails Right  Home Layout One level  Bathroom Shower/Tub Tub/shower unit;Walk-in shower  Bathroom Toilet Handicapped height  Bathroom Accessibility Yes  How Accessible Accessible via wheelchair  Home Equipment Walker - 2 wheels;[place] - single point   Lives With Spouse  Prior Function  Level of Independence Independent  Comments community ambulator, drives, was actively going to [employer] and using the pool there. This was prior to Feb 2020 admission.  Communication  Communication No difficulties  Pain Assessment  Pain Assessment Faces  Faces Pain Scale 6  Pain Location R hip and sternum (from CPR)  Pain Descriptors / Indicators Grimacing;Guarding;Sore;Moaning  Pain Intervention(s) Limited activity within patient's tolerance;Monitored during session;Repositioned  Cognition  Arousal/Alertness Awake/alert  Behavior During Therapy WFL for tasks assessed/performed  Overall Cognitive Status Impaired/Different from baseline  Area of Impairment Awareness;Problem solving;Safety/judgement  Following Commands Follows one step commands consistently  Safety/Judgement Decreased awareness of deficits;Decreased awareness of safety  Awareness Emergent  Problem Solving Slow processing  General Comments bilat mitts due to pt pulling at lines  Upper Extremity Assessment  Upper Extremity Assessment RUE deficits/detail  RUE Deficits / Details generalized weakness, and discoordination grossly 2/3 out of 5  RUE Sensation decreased light touch;decreased proprioception  RUE Coordination decreased fine motor;decreased gross motor  LUE Deficits / Details generalized weakness  Lower Extremity Assessment  Lower Extremity Assessment Defer to PT evaluation  Cervical / Trunk Assessment  Cervical / Trunk Assessment Normal  ADL  Overall ADL's  Needs assistance/impaired  Eating/Feeding NPO  Grooming Set  up;Sitting;Supervision/safety  Grooming Details (indicate cue type and reason) requires multiple cues for attention  Upper Body Bathing Minimal  assistance;Sitting  Lower Body Bathing Moderate assistance;Sitting/lateral leans  Upper Body Dressing  Moderate assistance;Sitting  Lower Body Dressing Total assistance  Toilet Transfer Maximal assistance;+2 for physical assistance  Toileting- Clothing Manipulation and Hygiene Total assistance;+2 for physical assistance;+2 for safety/equipment;Bed level  Functional mobility during ADLs Maximal assistance;+2 for physical assistance;+2 for safety/equipment  General ADL Comments 2 person face to face transfer limited by hip pain, but able to clear bottom off of bed  Vision- History  Baseline Vision/History Wears glasses  Wears Glasses At all times  Patient Visual Report No change from baseline  Vision- Assessment  Vision Assessment? No apparent visual deficits  Additional Comments will continue to assess functionally  Bed Mobility  Overal bed mobility Needs Assistance  Bed Mobility Supine to Sit;Sit to Supine  Supine to sit +2 for physical assistance;HOB elevated;Mod assist;Max assist  Sit to supine +2 for physical assistance;Max assist  General bed mobility comments Assist with BLE off bed and to elevate trunk. Pt with limited ability to assist with BUE due to sternal pain (from CPR). Use of bed pad to scoot to EOB.   Transfers  Overall transfer level Needs assistance  Equipment used 2 person hand held assist  Transfers Sit to/from Stand  Sit to Stand +2 physical assistance;Max assist  General transfer comment Assisted sit to stand from EOB with R knee blocked. Pt unable to attain full upright stance but was able to clear bottome from bed maintaining flexed posture.   Balance  Overall balance assessment Needs assistance  Sitting-balance support Feet supported;Bilateral upper extremity supported  Sitting balance-Leahy Scale Poor  Sitting balance  - Comments min assist to maintain balance EOB. Fatigues quickly  Postural control Left lateral lean;Posterior lean  Standing balance support Bilateral upper extremity supported;During functional activity  Standing balance-Leahy Scale Poor  Standing balance comment face to face HHA  General Comments  General comments (skin integrity, edema, etc.) wife present throughout session  OT - End of Session  Equipment Utilized During Treatment Gait belt  Activity Tolerance Patient tolerated treatment well  Patient left in bed;with call bell/phone within reach;with bed alarm set;with family/visitor present;with restraints reapplied;with SCD's reapplied  Nurse Communication Mobility status;Need for lift equipment  OT Assessment  OT Recommendation/Assessment Patient needs continued OT Services  OT Visit Diagnosis Muscle weakness (generalized) (M62.81);Other abnormalities of gait and mobility (R26.89);Pain;Hemiplegia and hemiparesis  Hemiplegia - Right/Left Right  Hemiplegia - dominant/non-dominant Dominant  Hemiplegia - caused by Cerebral infarction  Pain - Right/Left Right  Pain - part of body Hip  OT Problem List Decreased strength;Decreased range of motion;Decreased activity tolerance;Impaired balance (sitting and/or standing);Pain;Obesity;Impaired UE functional use;Impaired sensation;Increased edema  OT Plan  OT Frequency (ACUTE ONLY) Min 2X/week  OT Treatment/Interventions (ACUTE ONLY) Self-care/ADL training;Therapeutic exercise;Neuromuscular education;DME and/or AE instruction;Manual therapy;Therapeutic activities;Visual/perceptual remediation/compensation;Patient/family education;Balance training  AM-PAC OT "6 Clicks" Daily Activity Outcome Measure (Version 2)  Help from another person eating meals? 1 (NPO)  Help from another person taking care of personal grooming? 3  Help from another person toileting, which includes using toliet, bedpan, or urinal? 1  Help from another person bathing  (including washing, rinsing, drying)? 2  Help from another person to put on and taking off regular upper body clothing? 2  Help from another person to put on and taking off regular lower body clothing? 1  6 Click Score 10  OT Recommendation  Follow Up Recommendations CIR;Supervision/Assistance - 24 hour  OT Equipment Other (comment) (defer to next venue of care)  Individuals Consulted  Consulted and Agree with Results and Recommendations Patient;Family member/caregiver  Family Member Consulted Wife  Acute Rehab OT Goals  Patient Stated Goal back to CIR per wife  OT Goal Formulation With patient  Time For Goal Achievement 02/22/19  Potential to Achieve Goals Good  OT Time Calculation  OT Start Time (ACUTE ONLY) 0850  OT Stop Time (ACUTE ONLY) 0915  OT Time Calculation (min) 25 min  OT General Charges  $OT Visit 1 Visit  OT Evaluation  $OT Eval Moderate Complexity 1 Mod  Written Expression  Dominant Hand Right   Sherryl Manges OTR/L Acute Rehabilitation Services Pager: [phone] Office: [phone]

## 2019-02-08 NOTE — Evaluation (Signed)
Physical Therapy Evaluation Patient Details Name: Joel Rogers MRN: [id number] DOB: Jun 08, 1951 Today's Date: 02/08/2019   History of Present Illness  Pt is a 68 year old man who sustained a VF cardiac arrest on CIR 02/04/19. CPR with defibrillation x3 prior to ROSC. He was previously admitted 01/13/19 due to RUE/LE pain with sepsis and cellulitis. During that admission, MRI revealed multi acute ischemic infarcts. PMH consists of a fib, CHF, h/o subdural hematoma, HTN, and TAVR placement 07/2018. He was on CIR 01/30/19 to 02/04/19.     Clinical Impression  Pt admitted with above diagnosis. Pt currently with functional limitations due to the deficits listed below (see PT Problem List). On eval, pt required +2 mod/max assist bed mobility, min assist to maintain sitting balance EOB, and +2 max assist for sit to stand attempt. Pt able to clear bottom from bed but unable to attain full upright stance. Pt fatigues quickly. He c/o R hip pain and sternal pain (from CPR). He is motivated to participate in therapy. Pt will benefit from skilled PT to increase their independence and safety with mobility to allow discharge to the venue listed below.       Follow Up Recommendations CIR    Equipment Recommendations  Wheelchair (measurements PT);Wheelchair cushion (measurements PT);Hospital bed;Other (comment)    Recommendations for Other Services Rehab consult     Precautions / Restrictions Precautions Precautions: Fall;Other (comment) Precaution Comments: cortrak       Mobility  Bed Mobility Overal bed mobility: Needs Assistance       Supine to sit: +2 for physical assistance;HOB elevated;Mod assist;Max assist Sit to supine: +2 for physical assistance;Max assist   General bed mobility comments: Assist with BLE off bed and to elevate trunk. Pt with limited ability to assist with BUE due to sternal pain (from CPR). Use of bed pad to scoot to EOB.   Transfers Overall transfer level: Needs  assistance Equipment used: 2 person hand held assist Transfers: Sit to/from Stand Sit to Stand: +2 physical assistance;Max assist         General transfer comment: Assisted sit to stand from EOB with R knee blocked. Pt unable to attain full upright stance but was able to clear bottome from bed maintaining flexed posture.   Ambulation/Gait             General Gait Details: unable   Stairs            Wheelchair Mobility    Modified Rankin (Stroke Patients Only)       Balance Overall balance assessment: Needs assistance Sitting-balance support: Feet supported;Bilateral upper extremity supported Sitting balance-Leahy Scale: Poor Sitting balance - Comments: min assist to maintain balance EOB. Fatigues quickly Postural control: Left lateral lean                                   Pertinent Vitals/Pain Pain Assessment: Faces Faces Pain Scale: Hurts even more Pain Location: R hip and sternum (from CPR) Pain Descriptors / Indicators: Grimacing;Guarding;Sore;Moaning Pain Intervention(s): Limited activity within patient's tolerance;Monitored during session;Repositioned    Home Living Family/patient expects to be discharged to:: Private residence Living Arrangements: Spouse/significant other Available Help at Discharge: Family;Available 24 hours/day Type of Home: House Home Access: Stairs to enter Entrance Stairs-Rails: Right Entrance Stairs-Number of Steps: 3 Home Layout: One level Home Equipment: Walker - 2 wheels;Cane - single point      Prior Function Level of Independence:  Independent         Comments: community ambulator, drives, was actively going to [employer] and using the pool there. This was prior to Feb 2020 admission.     Hand Dominance   Dominant Hand: Right    Extremity/Trunk Assessment   Upper Extremity Assessment Upper Extremity Assessment: Defer to OT evaluation    Lower Extremity Assessment Lower Extremity Assessment:  RLE deficits/detail;Generalized weakness RLE Deficits / Details: 2/5-3/5 grossly graded RLE Sensation: decreased proprioception RLE Coordination: decreased gross motor;decreased fine motor    Cervical / Trunk Assessment Cervical / Trunk Assessment: Normal  Communication   Communication: No difficulties  Cognition Arousal/Alertness: Awake/alert Behavior During Therapy: WFL for tasks assessed/performed Overall Cognitive Status: Impaired/Different from baseline Area of Impairment: Awareness;Problem solving;Safety/judgement                       Following Commands: Follows one step commands consistently Safety/Judgement: Decreased awareness of deficits;Decreased awareness of safety Awareness: Emergent Problem Solving: Slow processing General Comments: bilat mitts due to pt pulling at lines      General Comments General comments (skin integrity, edema, etc.): Wife present during session.     Exercises     Assessment/Plan    PT Assessment Patient needs continued PT services  PT Problem List Decreased strength;Decreased activity tolerance;Decreased balance;Decreased mobility;Pain       PT Treatment Interventions Gait training;Therapeutic exercise;Stair training;Functional mobility training;Therapeutic activities;Patient/family education    PT Goals (Current goals can be found in the Care Plan section)  Acute Rehab PT Goals Patient Stated Goal: back to CIR per wife PT Goal Formulation: With patient/family Time For Goal Achievement: 02/22/19 Potential to Achieve Goals: Fair    Frequency Min 3X/week   Barriers to discharge        Co-evaluation PT/OT/SLP Co-Evaluation/Treatment: Yes Reason for Co-Treatment: Complexity of the patient's impairments (multi-system involvement);For patient/therapist safety PT goals addressed during session: Mobility/safety with mobility;Balance         AM-PAC PT "6 Clicks" Mobility  Outcome Measure Help needed turning from your  back to your side while in a flat bed without using bedrails?: A Lot Help needed moving from lying on your back to sitting on the side of a flat bed without using bedrails?: A Lot Help needed moving to and from a bed to a chair (including a wheelchair)?: Total Help needed standing up from a chair using your arms (e.g., wheelchair or bedside chair)?: A Lot Help needed to walk in hospital room?: Total Help needed climbing 3-5 steps with a railing? : Total 6 Click Score: 9    End of Session Equipment Utilized During Treatment: Gait belt Activity Tolerance: Patient limited by fatigue Patient left: in bed;with call bell/phone within reach;with family/visitor present;with restraints reapplied Nurse Communication: Mobility status;Need for lift equipment PT Visit Diagnosis: Unsteadiness on feet (R26.81);Other abnormalities of gait and mobility (R26.89);Muscle weakness (generalized) (M62.81);Pain Pain - Right/Left: Right Pain - part of body: Hip    Time: 1771-1657 PT Time Calculation (min) (ACUTE ONLY): 26 min   Charges:   PT Evaluation $PT Eval Moderate Complexity: 1 Mod          Aida Raider, PT  Office # [phone] Pager [phone]   Ilda Foil 02/08/2019, 10:47 AM

## 2019-02-08 NOTE — Consult Note (Signed)
   [facility] CM Inpatient Consult   02/08/2019  SANDRO CRISPIN December 17, 1950 [id number]   Patient was assessed for [facility] Care Management for community services and for high risk score for unplanned readmissions with [facility]. Patient has been previously outreached with [facility] Care Management RN Health Coach for HF EMMI follow up. Chart review reveals patient was in [facility] inpatient rehab but was admitted back to acute setting from Cardiac arrest. Current disposition is unknown but possibly to return to CIR for continued rehab.  Will follow for progress and disposition. Follow up with inpatient Transition of Care team ongoing.   Of note, [facility] Care Management services does not replace or interfere with any services that are arranged by inpatient case management or social work.   For additional questions or referrals please contact:  Charlesetta Shanks, RN BSN CCM [facility]  [phone] business mobile phone Toll free office [phone]

## 2019-02-08 NOTE — Progress Notes (Signed)
ANTICOAGULATION CONSULT NOTE  Pharmacy Consult for heparin Indication: atrial fibrillation  Allergies  Allergen Reactions  . Kayexalate [Polystyrene] Other (See Comments)    Recommended to avoid by GI due to ischemic gastropathy    Patient Measurements: Height: 6\' 1"  (185.4 cm) Weight: 251 lb 12.3 oz (114.2 kg) IBW/kg (Calculated) : 79.9 Heparin Dosing Weight: 106kg  Vital Signs: Temp: 98 F (36.7 C) (03/19 0800) Temp Source: Oral (03/19 0800) BP: 131/78 (03/19 1000) Pulse Rate: 78 (03/19 0800)  Labs: Recent Labs    02/06/19 0429  02/07/19 0425 02/07/19 1623 02/07/19 1704 02/08/19 0200 02/08/19 0400 02/08/19 1001  HGB 8.0*   < > 8.5* 8.5*  --   --  8.2*  --   HCT 24.6*   < > 26.1* 26.7*  --   --  24.7*  --   PLT 146*   < > 188 200  --   --  196  --   HEPARINUNFRC  --   --   --   --  0.18* 0.32  --  0.35  CREATININE 1.28*  --  1.12  --   --   --  0.97  --    < > = values in this interval not displayed.    Estimated Creatinine Clearance: 96.5 mL/min (by C-G formula based on SCr of 0.97 mg/dL).   Assessment: 68 year old male originally admitted to [facility] with bacteremia and cellulitis. Found to have bilateral CVAs and small SAH. Also found blood and blood clot in stomach but unable to anticoagulate. Patient was later transferred to CIR on 3/10. Patient was on apixaban prior to admission, d/t GIB this was never restarted during this admission.   Given melena in setting of recent stroke, will not bolus heparin infusion and will target reduced goal.   Heparin level remains therapeutic at 0.35, on 1450 units/hr. Hgb stable at 8.2, plt 196. No s/sx of bleeding. No infusion issues.   Goal of Therapy:  Heparin level 0.3-0.5 units/ml Monitor platelets by anticoagulation protocol: Yes   Plan:  -Continue IV heparin at 1450 units/hr.  -Daily heparin level and CBC   Sherron Monday, PharmD, BCCCP Clinical Pharmacist  Pager: [phone] Phone: [phone] **Pharmacist phone  directory can now be found on [email or web address] ([email or web address]).  Listed under [facility]. 02/08/2019 11:15 AM

## 2019-02-08 NOTE — Progress Notes (Signed)
IP rehab admissions - I am following for medical readiness in hopes of re admit to inpatient rehab once patient is medically ready.  We will need authorization for admit to CIR once medically ready.  Call me for questions.  [phone]

## 2019-02-09 DIAGNOSIS — I6340 Cerebral infarction due to embolism of unspecified cerebral artery: Secondary | ICD-10-CM | POA: Diagnosis present

## 2019-02-09 DIAGNOSIS — I6310 Cerebral infarction due to embolism of unspecified precerebral artery: Secondary | ICD-10-CM

## 2019-02-09 LAB — CBC
HCT: 24.7 % — ABNORMAL LOW (ref 39.0–52.0)
Hemoglobin: 8 g/dL — ABNORMAL LOW (ref 13.0–17.0)
MCH: 27.9 pg (ref 26.0–34.0)
MCHC: 32.4 g/dL (ref 30.0–36.0)
MCV: 86.1 fL (ref 80.0–100.0)
Platelets: 229 10*3/uL (ref 150–400)
RBC: 2.87 MIL/uL — AB (ref 4.22–5.81)
RDW: 20.4 % — ABNORMAL HIGH (ref 11.5–15.5)
WBC: 15.5 10*3/uL — ABNORMAL HIGH (ref 4.0–10.5)
nRBC: 0.5 % — ABNORMAL HIGH (ref 0.0–0.2)

## 2019-02-09 LAB — GLUCOSE, CAPILLARY
GLUCOSE-CAPILLARY: 136 mg/dL — AB (ref 70–99)
GLUCOSE-CAPILLARY: 137 mg/dL — AB (ref 70–99)
Glucose-Capillary: 119 mg/dL — ABNORMAL HIGH (ref 70–99)
Glucose-Capillary: 153 mg/dL — ABNORMAL HIGH (ref 70–99)
Glucose-Capillary: 128 mg/dL — ABNORMAL HIGH (ref 70–99)

## 2019-02-09 LAB — PHOSPHORUS: Phosphorus: 2.5 mg/dL (ref 2.5–4.6)

## 2019-02-09 LAB — MAGNESIUM: Magnesium: 1.9 mg/dL (ref 1.7–2.4)

## 2019-02-09 LAB — HEPARIN LEVEL (UNFRACTIONATED): Heparin Unfractionated: 0.3 IU/mL (ref 0.30–0.70)

## 2019-02-09 MED ORDER — PANTOPRAZOLE SODIUM 40 MG IV SOLR
40.0000 mg | Freq: Two times a day (BID) | INTRAVENOUS | Status: DC
  Administered 2019-02-09 – 2019-02-10 (×4): 40 mg via INTRAVENOUS
  Filled 2019-02-09 (×5): qty 40

## 2019-02-09 MED ORDER — PANTOPRAZOLE SODIUM 40 MG PO TBEC: 40.0000 mg | DELAYED_RELEASE_TABLET | Freq: Two times a day (BID) | ORAL | Status: DC

## 2019-02-09 MED ORDER — APIXABAN 2.5 MG PO TABS
2.5000 mg | ORAL_TABLET | Freq: Two times a day (BID) | ORAL | Status: DC
  Administered 2019-02-09 (×2): 2.5 mg via ORAL
  Filled 2019-02-09 (×2): qty 1

## 2019-02-09 MED ORDER — FUROSEMIDE 40 MG PO TABS
40.0000 mg | ORAL_TABLET | Freq: Every day | ORAL | Status: DC
  Administered 2019-02-09: 40 mg via ORAL
  Filled 2019-02-09: qty 1

## 2019-02-09 MED ORDER — SODIUM CHLORIDE 0.9% FLUSH
10.0000 mL | INTRAVENOUS | Status: DC | PRN
  Administered 2019-02-09 – 2019-02-12 (×3): 10 mL
  Filled 2019-02-09 (×3): qty 40

## 2019-02-09 NOTE — Progress Notes (Signed)
Occupational Therapy Treatment Patient Details Name: Joel Rogers MRN: [id number] DOB: 12/10/1950 Today's Date: 02/09/2019    History of present illness Pt is a 68 year old man who sustained a VF cardiac arrest on CIR 02/04/19. CPR with defibrillation x3 prior to ROSC. He was previously admitted 01/13/19 due to RUE/LE pain with sepsis and cellulitis. During that admission, MRI revealed multi acute ischemic infarcts. PMH consists of a fib, CHF, h/o subdural hematoma, HTN, and TAVR placement 07/2018. He was on CIR 01/30/19 to 02/04/19.    OT comments  Pt progressing towards OT goals this session. Overall max A for bed mobility, requires support in sitting for functional grooming tasks. Then able to perform HEP with focus on RUE at bed level. CIR remains essential and appropriate to maximize safety and independence in ADL and functional transfers.   Follow Up Recommendations  CIR;Supervision/Assistance - 24 hour    Equipment Recommendations  Other (comment)(defer to next venue)    Recommendations for Other Services      Precautions / Restrictions Precautions Precautions: Fall(telemetry) Precaution Comments: cortrak, NG tube Restrictions Weight Bearing Restrictions: No       Mobility Bed Mobility Overal bed mobility: Needs Assistance Bed Mobility: Supine to Sit;Sit to Supine Rolling: Mod assist   Supine to sit: Max assist Sit to supine: Max assist;+2 for physical assistance(NT called in to assist)   General bed mobility comments: assisted legs, then trunk to sit up and both back to bed  Transfers                 General transfer comment: NT this session    Balance Overall balance assessment: Needs assistance Sitting-balance support: Feet supported;Bilateral upper extremity supported Sitting balance-Leahy Scale: Fair Sitting balance - Comments: fatigues quickly                                   ADL either performed or assessed with clinical judgement    ADL Overall ADL's : Needs assistance/impaired         Upper Body Bathing: Minimal assistance;Sitting Upper Body Bathing Details (indicate cue type and reason): simulated through rubbing in lotion on arms                                 Vision       Perception     Praxis      Cognition Arousal/Alertness: Awake/alert Behavior During Therapy: WFL for tasks assessed/performed Overall Cognitive Status: Impaired/Different from baseline Area of Impairment: Memory;Following commands;Safety/judgement;Awareness;Attention                   Current Attention Level: Alternating Memory: Decreased recall of precautions;Decreased short-term memory Following Commands: Follows one step commands inconsistently;Follows one step commands with increased time Safety/Judgement: Decreased awareness of safety;Decreased awareness of deficits Awareness: Intellectual Problem Solving: Slow processing;Difficulty sequencing;Decreased initiation;Requires verbal cues;Requires tactile cues General Comments: bilat mitts due to pt pulling at lines        Exercises Exercises: General Upper Extremity General Exercises - Upper Extremity Shoulder Flexion: AROM;AAROM;Both;10 reps Shoulder ABduction: AAROM;Right;10 reps Shoulder ADduction: AAROM;Right;10 reps Shoulder Horizontal ABduction: AAROM;Right;10 reps Shoulder Horizontal ADduction: AAROM;Right;10 reps Elbow Flexion: Self ROM;AAROM;AROM;Both;10 reps Elbow Extension: AROM;AAROM;Self ROM;10 reps Wrist Flexion: PROM;Right;10 reps Wrist Extension: PROM;10 reps;Right Digit Composite Flexion: AROM;Strengthening;Right;10 reps Composite Extension: AROM;Strengthening;Right;10 reps;Seated   Shoulder Instructions  General Comments      Pertinent Vitals/ Pain       Pain Assessment: Faces Faces Pain Scale: Hurts even more Pain Location: R hip and chest (sternum) Pain Descriptors / Indicators: Grimacing Pain Intervention(s):  Limited activity within patient's tolerance;Monitored during session  Home Living                                          Prior Functioning/Environment              Frequency  Min 2X/week        Progress Toward Goals  OT Goals(current goals can now be found in the care plan section)  Progress towards OT goals: Progressing toward goals  Acute Rehab OT Goals Patient Stated Goal: back to CIR per wife OT Goal Formulation: With patient/family Time For Goal Achievement: 02/22/19 Potential to Achieve Goals: Good  Plan Discharge plan remains appropriate    Co-evaluation                 AM-PAC OT "6 Clicks" Daily Activity     Outcome Measure   Help from another person eating meals?: Total(NPO) Help from another person taking care of personal grooming?: A Little Help from another person toileting, which includes using toliet, bedpan, or urinal?: Total Help from another person bathing (including washing, rinsing, drying)?: A Lot Help from another person to put on and taking off regular upper body clothing?: A Lot Help from another person to put on and taking off regular lower body clothing?: Total 6 Click Score: 10    End of Session    OT Visit Diagnosis: Muscle weakness (generalized) (M62.81);Other abnormalities of gait and mobility (R26.89);Pain;Hemiplegia and hemiparesis Hemiplegia - Right/Left: Right Hemiplegia - dominant/non-dominant: Dominant Hemiplegia - caused by: Cerebral infarction Pain - Right/Left: Right Pain - part of body: Hip   Activity Tolerance Patient tolerated treatment well   Patient Left in bed;with call bell/phone within reach;with bed alarm set;with family/visitor present;with restraints reapplied;with SCD's reapplied   Nurse Communication Mobility status;Need for lift equipment        Time: [phone] OT Time Calculation (min): 32 min  Charges: OT General Charges $OT Visit: 1 Visit OT Treatments $Self Care/Home  Management : 8-22 mins $Therapeutic Activity: 8-22 mins  Sherryl Manges OTR/L Acute Rehabilitation Services Pager: [phone] Office: [phone]   Evern Bio Octavian Godek 02/09/2019, 5:47 PM

## 2019-02-09 NOTE — Progress Notes (Signed)
Inpatient Rehabilitation-Admissions Coordinator   [facility] has begun insurance authorization process for possible admit.   My coworker Roderic Palau will follow up regarding this pt on Monday.   Nanine Means, OTR/L  Rehab Admissions Coordinator  [phone] 02/09/2019 1:00 PM

## 2019-02-09 NOTE — Progress Notes (Signed)
CBG 136 

## 2019-02-09 NOTE — Progress Notes (Signed)
CBG 153 

## 2019-02-09 NOTE — Progress Notes (Signed)
STROKE TEAM PROGRESS NOTE   INTERVAL HISTORY His therapist and RN are at the bedside.   Continues to improve.  Vitals:   02/09/19 0024 02/09/19 0502 02/09/19 0917 02/09/19 1130  BP: (!) 140/97 (!) 146/83 136/90 (!) 134/94  Pulse: 76 72 63 72  Resp: 16 14 15 16   Temp: 98.5 F (36.9 C) 98 F (36.7 C) 98 F (36.7 C) 97.8 F (36.6 C)  TempSrc: Oral Oral Oral Oral  SpO2: 98% 100% 100% 99%  Weight:  117.1 kg    Height:        CBC:  Recent Labs  Lab 02/08/19 0400 02/09/19 0507  WBC 15.5* 15.5*  HGB 8.2* 8.0*  HCT 24.7* 24.7*  MCV 84.0 86.1  PLT 196 229    Basic Metabolic Panel:  Recent Labs  Lab 02/07/19 0425  02/08/19 0400 02/09/19 0507  NA 141  --  142  --   K 4.4  --  3.6  --   CL 110  --  113*  --   CO2 24  --  24  --   GLUCOSE 110*  --  133*  --   BUN 34*  --  31*  --   CREATININE 1.12  --  0.97  --   CALCIUM 8.7*  --  8.3*  --   MG  --    < > 2.0 1.9  PHOS  --    < > 2.8 2.5   < > = values in this interval not displayed.   Lipid Panel:     Component Value Date/Time   CHOL 104 02/04/2019 0445   TRIG 53 02/04/2019 0445   HDL 35 (L) 02/04/2019 0445   CHOLHDL 3.0 02/04/2019 0445   VLDL 11 02/04/2019 0445   LDLCALC 58 02/04/2019 0445   LDLCALC 70 05/01/2018 0913   HgbA1c:  Lab Results  Component Value Date   HGBA1C 5.8 (H) 07/21/2018   Urine Drug Screen: No results found for: LABOPIA, COCAINSCRNUR, LABBENZ, AMPHETMU, THCU, LABBARB  Alcohol Level No results found for: ETH  IMAGING No results found.  PHYSICAL EXAM Pleasant middle-aged African-American gentleman not in distress. . Afebrile. Head is nontraumatic. Neck is supple without bruit.    Cardiac exam no murmur or gallop. Lungs are clear to auscultation. Distal pulses are well felt. Neurological Exam : Awake alert oriented to place and person. .no dysarthria or aphasia.   No dysarthria or aphagia.Ce annly simple midline and one-step commands.  Extraocular movements are full range without  nystagmus.  Blinks to threat bilaterally.  Pupils equal reactive.  Fundi not visualized.  Right lower facial weakness.  Tongue midline.  Motor system exam reveals right hemiplegia with grade  3/5 strength.  Mild weakness on the left side 4/5 but 4/5  but effort is poor deep tendon reflexes are depressed on the right and elicitable on the left.  Right plantar upgoing left downgoing.  Gait not tested. ASSESSMENT/PLAN Mr. MARCIANO MEGA is a 68 y.o. male with history of recent embolic stroke d/t AF put who was d/c to IP rehab where he had VFib arrest preceded by CP in setting of GIB while off aspirin and eliquis, underwent successful CPR and transferred back to acute. During prior admission, he also has bacteremia from cellulitis with neg TEE, TAVR intact. Significant UGIB required 6u transfusion with plans to restart eliquis and aspirin on CIR  Encephalopathy  EEG triphasic waves, FIRDA, generalized irregular slow activity, slow posterior dominant rhythm. No seizure  Stroke:  No new stroke likely hypoxic ischemic encephalopathy related to cardiac arrest .  Small left frontal subarachnoid hemorrhage likely related to periprocedural stroke following cardiac cath. Stroke:  Recent multiple bilateral anterior and posterior circulation infarcts,  right left:[id number]}  infarct embolic secondary to AF  CT head no new infarcts. improving SAH  Previous admission, last CT multi foci infarct scattered anterior and posterior circulation. Stable L central sulcus SAH.   aspirin 81 mg daily and Eliquis (apixaban) daily prior to admission, ok to start heparin IV per Dr Pearlean Brownie. consider eliquis once able to swallow  Therapy recommendations:  pending - PT ordered, will add OT   Disposition:  pending    Chronic Atrial Fibrillation  Home anticoagulation:  heparin IV ok'd today by Dr. Pearlean Brownie . Change to Eliquis (apixaban) daily once stable from GI standpoint and ok from cardiac standpoint   Dysphagia  Has  panda/coretrak w/ TF  For SLP assessment  Marked Hypertension  BP goal normotensive from stroke standpoint  Hyperlipidemia  Home meds:  lipitor 80, resumed in hospital  LDL 58, at goal < 70  Continue statin at discharge  Diabetes type II  HgbA1c goal < 7.0  Other Stroke Risk Factors  Advanced age  Former Cigarette smoker  Obesity, Body mass index is 34.06 kg/m., recommend weight loss, diet and exercise as appropriate   Hx stroke/TIA  01/19/2019 - Multifocal infarcts throughout the cerebrum and cerebellum - embolic from afib  Coronary artery disease, NSTEMI  Severe AS s/p TAVR  obstructive sleep apnea , on CPAP at home  Other Active Problems  VF cardiac arrest  UGIB from ischemic anastomotic ulcer - GI ok'd IV heparin  Volume overload  Acute blood loss anemia  Hospital day # 6   Patient developed cardiac arrest requiring resuscitation and has developed altered mental status which likely represents hypoxic ischemic encephalopathy superimposed upon his recent stroke and right hemiplegia.  Small left frontal subarachnoid hemorrhage likely from a small periprocedural stroke related to cardiac cath.  Patient is at high risk for recurrent thromboembolism given history of atrial fibrillation and cardiology and GI feel he is safe anticoagulation hence recommend  Eliquis for anticoagulation.  Discontinue heparin.  Start Eliquis 2.5 twice daily . Discontinue heparin. Start eliquis 2.5 twice a day and andtransition to 5 mg twice daily if tolerated without hemorrhage  but cautious watch for GI hemorrhagef   Transfer to inpatient rehab when bed available.transfer to inpatient rehabilitation when bed available.. Stroke team will sign off.  Kindly call for questions.stroke team will sign off. Kindly call for questions.   Delia Heady, MD Medical Director [facility] Pager: [phone] 02/09/2019 1:37 PM  To contact Stroke Continuity provider, please refer to  [email or web address]. After hours, contact General Neurology

## 2019-02-09 NOTE — Progress Notes (Addendum)
ANTICOAGULATION CONSULT NOTE  Pharmacy Consult for heparin>> apixaban Indication: atrial fibrillation  Allergies  Allergen Reactions  . Kayexalate [Polystyrene] Other (See Comments)    Recommended to avoid by GI due to ischemic gastropathy    Patient Measurements: Height: 6\' 1"  (185.4 cm) Weight: 258 lb 2.5 oz (117.1 kg) IBW/kg (Calculated) : 79.9 Heparin Dosing Weight: 106kg  Vital Signs: Temp: 98 F (36.7 C) (03/20 0502) Temp Source: Oral (03/20 0502) BP: 146/83 (03/20 0502) Pulse Rate: 72 (03/20 0502)  Labs: Recent Labs    02/07/19 0425 02/07/19 1623  02/08/19 0200 02/08/19 0400 02/08/19 1001 02/09/19 0507  HGB 8.5* 8.5*  --   --  8.2*  --  8.0*  HCT 26.1* 26.7*  --   --  24.7*  --  24.7*  PLT 188 200  --   --  196  --  229  HEPARINUNFRC  --   --    < > 0.32  --  0.35 0.30  CREATININE 1.12  --   --   --  0.97  --   --    < > = values in this interval not displayed.    Estimated Creatinine Clearance: 97.7 mL/min (by C-G formula based on SCr of 0.97 mg/dL).   Assessment: 68 year old male originally admitted to [facility] with bacteremia and cellulitis. Found to have bilateral CVAs and small SAH. Also found blood and blood clot in stomach but unable to anticoagulate. Patient was later transferred to CIR on 3/10. Patient was on apixaban prior to admission, d/t GIB this was never restarted during this admission. He is currently on heparin and plans to transition to apixiban 2.5mg  po bid for 48hrs then increase to 5mg  po bid -Hg= 8.0, SCr= 0.97  Goal of Therapy:  Monitor platelets by anticoagulation protocol: Yes   Plan:  -Discontinue heparin  -Begin apixaban 2.5mg  po bid then increase to 5mg  po bid on 3/22 if Hg stable and no signs of bleeding -CBC daily for now  Harland German, PharmD Clinical Pharmacist **Pharmacist phone directory can now be found on [email or web address] ([email or web address]).  Listed under [facility].

## 2019-02-09 NOTE — Progress Notes (Signed)
Physical Therapy Treatment Patient Details Name: Joel Rogers MRN: [id number] DOB: 04/04/1951 Today's Date: 02/09/2019    History of Present Illness Pt is a 68 year old man who sustained a VF cardiac arrest on CIR 02/04/19. CPR with defibrillation x3 prior to ROSC. He was previously admitted 01/13/19 due to RUE/LE pain with sepsis and cellulitis. During that admission, MRI revealed multi acute ischemic infarcts. PMH consists of a fib, CHF, h/o subdural hematoma, HTN, and TAVR placement 07/2018. He was on CIR 01/30/19 to 02/04/19.     PT Comments    Pt was seen for mobility and strengthening with a limited effort to stand possible due to pain on R knee.  Pt is willing to work, motivated and making a good effort with multiple attempts.  Follow up with him on all goals to work on strengthening, awareness of R side, cautions with his painful chest and R knee and a focus on being up as possible.  Pt is positioned at end of session in a sitting posture, noting his comfort and with NG tube precautions observed.   Follow Up Recommendations  CIR     Equipment Recommendations  Wheelchair (measurements PT);Wheelchair cushion (measurements PT);Hospital bed;Other (comment)    Recommendations for Other Services Rehab consult     Precautions / Restrictions Precautions Precautions: Fall(telemetry) Precaution Comments: cortrak, NG tube Restrictions Weight Bearing Restrictions: No    Mobility  Bed Mobility Overal bed mobility: Needs Assistance Bed Mobility: Supine to Sit;Sit to Supine Rolling: Mod assist   Supine to sit: Max assist Sit to supine: Max assist   General bed mobility comments: assisted legs, then trunk to sit up and both back to bed  Transfers Overall transfer level: Needs assistance Equipment used: 1 person hand held assist Transfers: Sit to/from Stand Sit to Stand: Max assist;From elevated surface         General transfer comment: Pt cleared his hips partially to stand but  due to R knee pain could not fully stand  Ambulation/Gait             General Gait Details: unable   Stairs             Wheelchair Mobility    Modified Rankin (Stroke Patients Only)       Balance     Sitting balance-Leahy Scale: Fair     Standing balance support: Bilateral upper extremity supported;During functional activity Standing balance-Leahy Scale: Poor                              Cognition Arousal/Alertness: Awake/alert Behavior During Therapy: WFL for tasks assessed/performed Overall Cognitive Status: Impaired/Different from baseline Area of Impairment: Memory;Following commands;Safety/judgement;Awareness;Attention                   Current Attention Level: Alternating Memory: Decreased recall of precautions;Decreased short-term memory Following Commands: Follows one step commands inconsistently;Follows one step commands with increased time Safety/Judgement: Decreased awareness of safety;Decreased awareness of deficits Awareness: Intellectual Problem Solving: Slow processing;Difficulty sequencing;Decreased initiation;Requires verbal cues;Requires tactile cues General Comments: bilat mitts due to pt pulling at lines      Exercises      General Comments General comments (skin integrity, edema, etc.): pt is up to side of bed with PT and was assisted back by PT but repositioned with CNA to maintain elevated head posture for NG tube      Pertinent Vitals/Pain Pain Assessment: Faces Faces Pain Scale: Hurts  even more Pain Location: R knee and chest (sternum) Pain Descriptors / Indicators: Grimacing Pain Intervention(s): Limited activity within patient's tolerance;Monitored during session;Premedicated before session;Repositioned    Home Living                      Prior Function            PT Goals (current goals can now be found in the care plan section) Acute Rehab PT Goals Patient Stated Goal: back to CIR per  wife Progress towards PT goals: Progressing toward goals    Frequency    Min 3X/week      PT Plan Current plan remains appropriate    Co-evaluation              AM-PAC PT "6 Clicks" Mobility   Outcome Measure  Help needed turning from your back to your side while in a flat bed without using bedrails?: A Lot Help needed moving from lying on your back to sitting on the side of a flat bed without using bedrails?: A Lot Help needed moving to and from a bed to a chair (including a wheelchair)?: Total Help needed standing up from a chair using your arms (e.g., wheelchair or bedside chair)?: A Lot Help needed to walk in hospital room?: Total Help needed climbing 3-5 steps with a railing? : Total 6 Click Score: 9    End of Session Equipment Utilized During Treatment: Gait belt Activity Tolerance: Patient limited by fatigue;Patient limited by lethargy;Patient limited by pain Patient left: in bed;with call bell/phone within reach;with bed alarm set;with nursing/sitter in room Nurse Communication: Mobility status PT Visit Diagnosis: Unsteadiness on feet (R26.81);Other abnormalities of gait and mobility (R26.89);Muscle weakness (generalized) (M62.81);Pain Pain - Right/Left: Right Pain - part of body: Knee(sternum)     Time: 1660-6004 PT Time Calculation (min) (ACUTE ONLY): 26 min  Charges:  $Therapeutic Activity: 23-37 mins                    Ivar Drape 02/09/2019, 11:39 AM  Samul Dada, PT MS Acute Rehab Dept. Number: [facility] [id number] and [facility] [phone]

## 2019-02-09 NOTE — Care Management Important Message (Signed)
Important Message  Patient Details  Name: Joel Rogers MRN: [id number] Date of Birth: 02-12-1951   Medicare Important Message Given:  Yes    Dorena Bodo 02/09/2019, 3:23 PM

## 2019-02-09 NOTE — Progress Notes (Signed)
CBG 137 

## 2019-02-09 NOTE — Progress Notes (Addendum)
Nutrition Follow-up  DOCUMENTATION CODES:   Morbid obesity, Non-severe (moderate) malnutrition in context of chronic illness  INTERVENTION:   -Continue Osmolite 1.5 @ 55 ml/hr via cortrak tube  30 ml Prostat BID.    Tube feeding regimen provides 2180 kcal (100% of needs), 113 grams of protein, and 1003 ml of H2O.   -RD will follow for ability to advance to PO diet and supplement as appropriate  NUTRITION DIAGNOSIS:   Moderate Malnutrition related to altered GI function, chronic illness(Gastric Bypass with multiple complications and CHF) as evidenced by mild fat depletion, edema, moderate muscle depletion.  Ongoing  GOAL:   Patient will meet greater than or equal to 90% of their needs  Met with TF  MONITOR:   TF tolerance, Labs, Weight trends, Diet advancement, I & O's, Skin  REASON FOR ASSESSMENT:   Consult Enteral/tube feeding initiation and management  ASSESSMENT:   Pt is a 27y M with PMH of diastolic CHF, aortic stenosis s/p TAVR September 2019, A.Fib (Eliquis), OSA-CPAP, HTN, and GI bleed (ischemic anastomatic ulcers), subdural hematoma. Pt sustained a VF cardiac arrest on CIR. Pt is now admitted for Acute coronary syndrome, multiple cardiobembolic stroke, and AMS.  3/18- cortrak tube placed (gastric placement), TF initiated 3/19- transferred from ICU to floor  Reviewed I/O's: +310 ml x 24 hours and -5.5 L since admission  UOP: +1.2 L x 24 hours  Pt resting quietly at time of visit. Spoke with pt wife at bedside, who reports pt is in good spirits today. Pt tolerating TF well, however, "he wants that tube out". Discussed with pt wife rationale for TF and how he is receiving nutrition currently. Also informed that swallow study would need to be performed by speech prior to cortrak tube removal, to ensure pt able to take PO's safely.   Per discussion with Dr. Copper, plan to order repeat swallow eval to determine readiness for PO's.   Osmolite 1.5 infusing at 55  ml/hr via cortrak tube. Pt also receiving 30 ml Prostat BID. Complete TF regimen providing 2180 kcals, 113 grams protein, and 1003 ml free water daily, meeting 100% of estimated kcal and protein needs.   Per chart review, plan to d/c to CIR once medically ready.  Labs reviewed: Mg and Phos WDL. CBGS: 115-128.   Diet Order:   Diet Order    None      EDUCATION NEEDS:   Not appropriate for education at this time  Skin:  Skin Assessment: Skin Integrity Issues: Skin Integrity Issues:: Stage II Stage II: Sacrum; Medial;Left & Buttocks; Right;Lower    Last BM:  02/08/19  Height:   Ht Readings from Last 1 Encounters:  02/04/19 _0  (1.854 m)    Weight:   Wt Readings from Last 1 Encounters:  02/09/19 117.1 kg    Ideal Body Weight:  83.6 kg  BMI:  Body mass index is 34.06 kg/m.  Estimated Nutritional Needs:   Kcal:  2000-2300  Protein:  100-115 grams  Fluid:  1.5-2L or MD recommendations     Maurita Havener A. Jimmye Norman, RD, LDN, [place] Registered Dietitian II Certified Diabetes Care and Education Specialist Pager: [phone] After hours Pager: [phone]

## 2019-02-09 NOTE — Progress Notes (Addendum)
Progress Note  Patient Name: Joel Rogers Date of Encounter: 02/09/2019  Primary Cardiologist: Nanetta Batty, MD   Subjective   Sitting up in bed watching TV.   Inpatient Medications    Scheduled Meds: . amLODipine  5 mg Oral Daily  . apixaban  2.5 mg Oral BID  . atorvastatin  80 mg Oral q1800  . chlorhexidine  15 mL Mouth Rinse BID  . Chlorhexidine Gluconate Cloth  6 each Topical Q0600  . feeding supplement (PRO-STAT SUGAR FREE 64)  30 mL Per Tube BID  . lip balm   Topical BID  . mouth rinse  15 mL Mouth Rinse BID  . mouth rinse  15 mL Mouth Rinse q12n4p  . metoprolol tartrate  50 mg Oral BID  . pantoprazole  40 mg Oral BID   Continuous Infusions: . feeding supplement (OSMOLITE 1.5 CAL) 1,000 mL (02/09/19 0500)   PRN Meds: morphine injection, nitroGLYCERIN, ondansetron (ZOFRAN) IV, sodium chloride flush, traMADol-acetaminophen   Vital Signs    Vitals:   02/08/19 2054 02/08/19 2056 02/09/19 0024 02/09/19 0502  BP: (!) 157/99 (!) 187/99 (!) 140/97 (!) 146/83  Pulse: 82 82 76 72  Resp:   16 14  Temp: 97.8 F (36.6 C)  98.5 F (36.9 C) 98 F (36.7 C)  TempSrc: Oral  Oral Oral  SpO2: 100%  98% 100%  Weight:    117.1 kg  Height:        Intake/Output Summary (Last 24 hours) at 02/09/2019 0900 Last data filed at 02/09/2019 0500 Gross per 24 hour  Intake 1346.5 ml  Output 1175 ml  Net 171.5 ml   Last 3 Weights 02/09/2019 02/08/2019 02/07/2019  Weight (lbs) 258 lb 2.5 oz 251 lb 12.3 oz 258 lb 9.6 oz  Weight (kg) 117.1 kg 114.2 kg 117.3 kg      Telemetry    Afib rate controlled - Personally Reviewed  ECG    N/a - Personally Reviewed  Physical Exam   GEN: No acute distress.   Neck: No JVD Cardiac: Irreg Irreg, 2/6 systolic murmur, rubs, or gallops.  Respiratory: Clear to auscultation bilaterally. GI: Soft, nontender, non-distended. Cortrac in place. MS: 1+ LE edema; No deformity. Neuro:  Alert and answers questions appropriately.  Psych: Normal  affect   Labs    Chemistry Recent Labs  Lab 02/03/19 0834  02/06/19 0429 02/07/19 0425 02/08/19 0400  NA 136   < > 137 141 142  K 3.0*   < > 5.0 4.4 3.6  CL 109   < > 111 110 113*  CO2 20*   < > GLUCOSE 140*   < > 96 110* 133*  BUN 11   < > 40* 34* 31*  CREATININE 0.96   < > 1.28* 1.12 0.97  CALCIUM 8.2*   < > 7.9* 8.7* 8.3*  PROT 6.1*  --   --  6.5  --   ALBUMIN 2.0*  --   --  2.2*  --   AST 120*  --   --  39  --   ALT 39  --   --  21  --   ALKPHOS 98  --   --  93  --   BILITOT 1.3*  --   --  1.3*  --   GFRNONAA >60   < > 57* >60 >60  GFRAA >60   < > >60 >60 >60  ANIONGAP 7   < > 4* 7 5   < > =  values in this interval not displayed.     Hematology Recent Labs  Lab 02/07/19 1623 02/08/19 0400 02/09/19 0507  WBC 16.3* 15.5* 15.5*  RBC 3.18* 2.94* 2.87*  HGB 8.5* 8.2* 8.0*  HCT 26.7* 24.7* 24.7*  MCV 84.0 84.0 86.1  MCH 26.7 27.9 27.9  MCHC 31.8 33.2 32.4  RDW 19.4* 19.7* 20.4*  PLT 200 196 229    Cardiac Enzymes Recent Labs  Lab 02/03/19 0834 02/03/19 1416 02/03/19 2038  TROPONINI 0.20* 12.24* >65.00*   No results for input(s): TROPIPOC in the last 168 hours.   BNP Recent Labs  Lab 02/03/19 0834  BNP 408.1*     DDimer No results for input(s): DDIMER in the last 168 hours.   Radiology    Dg Abd Portable 1v  Result Date: 02/07/2019 CLINICAL DATA:  Check feeding catheter placement EXAM: PORTABLE ABDOMEN - 1 VIEW COMPARISON:  None. FINDINGS: Feeding catheter is noted within the stomach. Cardiac shadow is enlarged. Changes of prior TAVR are noted. No other focal abnormality is seen. IMPRESSION: Feeding catheter within the stomach. Electronically Signed   By: Alcide Clever M.D.   On: 02/07/2019 12:13    Cardiac Studies   TTE: 02/05/2019  . The left ventricle has hyperdynamic systolic function, with an ejection fraction of >65%. The cavity size was normal. There is moderate concentric left ventricular hypertrophy. Left ventricular diastolic  function could not be evaluated secondary to  atrial fibrillation. No evidence of left ventricular regional wall motion abnormalities.  2. The right ventricle has mildly reduced systolic function. The cavity was normal. There is no increase in right ventricular wall thickness. Right ventricular systolic pressure is severely elevated with an estimated pressure of 63.3 mmHg.  3. Left atrial size was severely dilated.  4. Right atrial size was severely dilated.  5. Small pericardial effusion.  6. The pericardial effusion is posterior to the left ventricle and the left atrium.  7. There is mild mitral annular calcification present.  8. Tricuspid valve regurgitation is mild-moderate.  9. A 26 an Edwards Edwards Sapien bioprosthetic aortic valve (TAVR) valve is present in the aortic position. Normal aortic valve prosthesis. 10. The pulmonic valve was grossly normal. Pulmonic valve regurgitation is mild to moderate is mild by color flow Doppler. 11. The inferior vena cava was dilated in size with <50% respiratory variability. 12. Possible small PFO. 13. There is right bowing of the interatrial septum, suggestive of elevated left atrial pressure.  Cath: 02/05/2019   Lat Ramus lesion is 99% stenosed. Heavy thrombus.  No other significant CAD.   D/w Dr. Excell Seltzer.  Restart IV heparin tonight and if Hbg stable, will restart Eliquis tomorrow.   Medical therapy for ramus disease.    Patient Profile     68 y.o. male with history of severe aortic stenosis and TAVR approximately 6 months ago. He presents now with large non-STEMI troponin greater than 65 and V. fib cardiac arrest. This occurred after recent cardioembolic stroke without clear source of embolism by TEE evaluation.  Assessment & Plan    1.  Stroke/altered mental status: Appreciate neurology evaluation. He is alert and able to answer questions appropriately this morning. -- no longer on ASA given the need for [facility] and recent GI bleed.  On statin  2.  Non-STEMI: Felt to be 2/2 to thrombus. Plan for [facility] to transition from heparin to Eliquis today.   3.  Gastrointestinal bleed: Patient noted to have upper GI bleed, GI consulted.   -- Recommendations  to start low-dose oral anticoagulation today to ensure he can tolerate without major bleeding. Will transition to Eliquis 2.5mg  BID today with plans to increased in the coming days if able to tolerate.  -- Hgb 8, follow up in the morning -- switch gtt to IV protonix  4.  Permanent atrial fibrillation: remains in Afib though rate is controlled  5.  Acute diastolic heart failure: Received several doses of IV furosemide. Still with some edema on exam. -- start lasix 40mg  oral today  6.  Disposition: Transferred from the unit yesterday. Recommendation for CIR when medically stable. CIR admission coordinator following. Suspect will need remains inpatient at least through tomorrow to follow response to Eliquis dosing.   For questions or updates, please contact [facility] Please consult [email or web address] for contact info under   Signed, Laverda Page, NP  02/09/2019, 9:00 AM    Patient seen, examined. Available data reviewed. Agree with findings, assessment, and plan as outlined by Sheppard Coil, NP.  The patient is alert and oriented, in no distress.  JVP is normal, lung fields are clear, heart is irregular with no murmur or gallop, abdomen is soft and nontender, extremities have mild edema in the feet improved from yesterday.  The patient is very eager to get the feeding tube out of his nose.  He otherwise is doing quite well.  He has now been transitioned to apixaban 2.5 mg twice daily which we plan to continue for 48 hours to make sure that of GI bleed is not precipitated.  If he tolerates this we will move to therapeutic apixaban at 5 mg twice daily.  Aspirin is been discontinued.  His heart rhythm has been stable and I am hopeful that he will be able to transition to [facility]  inpatient rehab early next week.  Agree with starting Lasix 40 mg daily today.  He has responded fairly well to intermittent IV Lasix.  Appreciate neurology and gastroenterology evaluations.  Tonny Bollman, M.D. 02/09/2019 2:55 PM

## 2019-02-10 LAB — CBC
HCT: 25.3 % — ABNORMAL LOW (ref 39.0–52.0)
Hemoglobin: 7.9 g/dL — ABNORMAL LOW (ref 13.0–17.0)
MCH: 26.9 pg (ref 26.0–34.0)
MCHC: 31.2 g/dL (ref 30.0–36.0)
MCV: 86.1 fL (ref 80.0–100.0)
Platelets: 206 10*3/uL (ref 150–400)
RBC: 2.94 MIL/uL — ABNORMAL LOW (ref 4.22–5.81)
RDW: 20.4 % — ABNORMAL HIGH (ref 11.5–15.5)
WBC: 13.7 10*3/uL — AB (ref 4.0–10.5)
nRBC: 0.6 % — ABNORMAL HIGH (ref 0.0–0.2)

## 2019-02-10 LAB — BASIC METABOLIC PANEL
ANION GAP: 4 — AB (ref 5–15)
BUN: 20 mg/dL (ref 8–23)
CO2: 26 mmol/L (ref 22–32)
Calcium: 8.2 mg/dL — ABNORMAL LOW (ref 8.9–10.3)
Chloride: 114 mmol/L — ABNORMAL HIGH (ref 98–111)
Creatinine, Ser: 0.83 mg/dL (ref 0.61–1.24)
GFR calc non Af Amer: >60 mL/min (ref >60)
Glucose, Bld: 118 mg/dL — ABNORMAL HIGH (ref 70–99)
Potassium: 3.5 mmol/L (ref 3.5–5.1)
Sodium: 144 mmol/L (ref 135–145)

## 2019-02-10 MED ORDER — AMLODIPINE BESYLATE 2.5 MG PO TABS
5.0000 mg | ORAL_TABLET | Freq: Every day | ORAL | Status: DC
  Administered 2019-02-10: 5 mg
  Filled 2019-02-10: qty 2

## 2019-02-10 MED ORDER — TRAMADOL-ACETAMINOPHEN 37.5-325 MG PO TABS: 1.0000 | ORAL_TABLET | Freq: Four times a day (QID) | ORAL | Status: DC | PRN

## 2019-02-10 MED ORDER — METOPROLOL TARTRATE 50 MG PO TABS
50.0000 mg | ORAL_TABLET | Freq: Two times a day (BID) | ORAL | Status: DC
  Administered 2019-02-10 (×2): 50 mg
  Filled 2019-02-10 (×2): qty 1

## 2019-02-10 MED ORDER — ATORVASTATIN CALCIUM 80 MG PO TABS
80.0000 mg | ORAL_TABLET | Freq: Every day | ORAL | Status: DC
  Administered 2019-02-10: 80 mg
  Filled 2019-02-10: qty 1

## 2019-02-10 MED ORDER — APIXABAN 2.5 MG PO TABS
2.5000 mg | ORAL_TABLET | Freq: Two times a day (BID) | ORAL | Status: DC
  Administered 2019-02-10 (×2): 2.5 mg
  Filled 2019-02-10 (×2): qty 1

## 2019-02-10 MED ORDER — FUROSEMIDE 40 MG PO TABS
40.0000 mg | ORAL_TABLET | Freq: Every day | ORAL | Status: DC
  Administered 2019-02-10: 40 mg
  Filled 2019-02-10: qty 1

## 2019-02-10 NOTE — Progress Notes (Signed)
CBG 84 for 4 pm.  HR went up to 150 per CCMD, patient was transferred at that time from chair to the bed. Went back to baseline as soon as patient was in the bed. Patient asymptomatic.

## 2019-02-10 NOTE — Progress Notes (Signed)
Per speech therapy patient did well, regular diet ordered. Notified PA on call Lisabeth Devoid about this, requested to address NG tube and tube feeding.  Braiden Rodman, RN

## 2019-02-10 NOTE — Progress Notes (Signed)
Patient tolerated food well. Tube feeding stopped. Keep NG  till tomorrow per PA.

## 2019-02-10 NOTE — Progress Notes (Signed)
Progress Note  Patient Name: Joel Rogers Date of Encounter: 02/10/2019  Primary Cardiologist: Nanetta Batty, MD   Subjective   In bed watching TV.  No acute complaints.  Inpatient Medications    Scheduled Meds: . amLODipine  5 mg Per Tube Daily  . apixaban  2.5 mg Per Tube BID  . atorvastatin  80 mg Per Tube q1800  . chlorhexidine  15 mL Mouth Rinse BID  . feeding supplement (PRO-STAT SUGAR FREE 64)  30 mL Per Tube BID  . furosemide  40 mg Per Tube Daily  . lip balm   Topical BID  . mouth rinse  15 mL Mouth Rinse BID  . mouth rinse  15 mL Mouth Rinse q12n4p  . metoprolol tartrate  50 mg Per Tube BID  . pantoprazole (PROTONIX) IV  40 mg Intravenous Q12H   Continuous Infusions: . feeding supplement (OSMOLITE 1.5 CAL) Stopped (02/10/19 1150)   PRN Meds: morphine injection, nitroGLYCERIN, ondansetron (ZOFRAN) IV, sodium chloride flush, traMADol-acetaminophen   Vital Signs    Vitals:   02/09/19 2121 02/09/19 2350 02/10/19 0518 02/10/19 0942  BP: 125/78 132/86 135/90 (!) 143/90  Pulse: 77 68 95 86  Resp: 18 14 16 18   Temp: 98.3 F (36.8 C) 97.7 F (36.5 C) 98.9 F (37.2 C)   TempSrc: Oral Oral Oral   SpO2: 100% 95% 99% 98%  Weight:   115.4 kg   Height:        Intake/Output Summary (Last 24 hours) at 02/10/2019 1210 Last data filed at 02/10/2019 1035 Gross per 24 hour  Intake 1544.92 ml  Output 1750 ml  Net -205.08 ml   Last 3 Weights 02/10/2019 02/09/2019 02/08/2019  Weight (lbs) 254 lb 6.6 oz 258 lb 2.5 oz 251 lb 12.3 oz  Weight (kg) 115.4 kg 117.1 kg 114.2 kg      Telemetry    Atrial fibrillation, rate controlled- Personally Reviewed  ECG    None new- Personally Reviewed  Physical Exam   GEN: Well nourished, well developed, in no acute distress  HEENT: normal  Neck: no JVD, carotid bruits, or masses Cardiac: iRRR; 2/6 murmur, no rubs, or gallops,no edema  Respiratory:  clear to auscultation bilaterally, normal work of breathing GI: soft,  nontender, nondistended, + BS MS: no deformity or atrophy  Skin: warm and dry Neuro:  Strength and sensation are intact Psych: euthymic mood, full affect   Labs    Chemistry Recent Labs  Lab 02/07/19 0425 02/08/19 0400 02/10/19 0340  NA 141 142 144  K 4.4 3.6 3.5  CL 110 113* 114*  CO2 24 24 26   GLUCOSE 110* 133* 118*  BUN 34* 31* 20  CREATININE 1.12 0.97 0.83  CALCIUM 8.7* 8.3* 8.2*  PROT 6.5  --   --   ALBUMIN 2.2*  --   --   AST 39  --   --   ALT 21  --   --   ALKPHOS 93  --   --   BILITOT 1.3*  --   --   GFRNONAA >60 >60 >60  GFRAA >60 >60 >60  ANIONGAP 7 5 4*     Hematology Recent Labs  Lab 02/08/19 0400 02/09/19 0507 02/10/19 0340  WBC 15.5* 15.5* 13.7*  RBC 2.94* 2.87* 2.94*  HGB 8.2* 8.0* 7.9*  HCT 24.7* 24.7* 25.3*  MCV 84.0 86.1 86.1  MCH 27.9 27.9 26.9  MCHC 33.2 32.4 31.2  RDW 19.7* 20.4* 20.4*  PLT 196 229 206  Cardiac Enzymes Recent Labs  Lab 02/03/19 1416 02/03/19 2038  TROPONINI 12.24* >65.00*   No results for input(s): TROPIPOC in the last 168 hours.   BNP No results for input(s): BNP, PROBNP in the last 168 hours.   DDimer No results for input(s): DDIMER in the last 168 hours.   Radiology    No results found.  Cardiac Studies   TTE: 02/05/2019  . The left ventricle has hyperdynamic systolic function, with an ejection fraction of >65%. The cavity size was normal. There is moderate concentric left ventricular hypertrophy. Left ventricular diastolic function could not be evaluated secondary to  atrial fibrillation. No evidence of left ventricular regional wall motion abnormalities.  2. The right ventricle has mildly reduced systolic function. The cavity was normal. There is no increase in right ventricular wall thickness. Right ventricular systolic pressure is severely elevated with an estimated pressure of 63.3 mmHg.  3. Left atrial size was severely dilated.  4. Right atrial size was severely dilated.  5. Small pericardial  effusion.  6. The pericardial effusion is posterior to the left ventricle and the left atrium.  7. There is mild mitral annular calcification present.  8. Tricuspid valve regurgitation is mild-moderate.  9. A 26 an Edwards Edwards Sapien bioprosthetic aortic valve (TAVR) valve is present in the aortic position. Normal aortic valve prosthesis. 10. The pulmonic valve was grossly normal. Pulmonic valve regurgitation is mild to moderate is mild by color flow Doppler. 11. The inferior vena cava was dilated in size with <50% respiratory variability. 12. Possible small PFO. 13. There is right bowing of the interatrial septum, suggestive of elevated left atrial pressure.  Cath: 02/05/2019   Lat Ramus lesion is 99% stenosed. Heavy thrombus.  No other significant CAD.   D/w Dr. Excell Seltzer.  Restart IV heparin tonight and if Hbg stable, Airiana Elman restart Eliquis tomorrow.   Medical therapy for ramus disease.    Patient Profile     68 y.o. male with history of severe aortic stenosis and TAVR approximately 6 months ago. He presents now with large non-STEMI troponin greater than 65 and V. fib cardiac arrest. This occurred after recent cardioembolic stroke without clear source of embolism by TEE evaluation.  Assessment & Plan    1.  Stroke/altered mental status: She had neurology evaluation.  Alert and able to answer questions appropriately.  Has somewhat passed a swallow study.  Suheily Birks potentially plan for removing NG tube tomorrow.  Currently on Eliquis.    2.  Non-STEMI: Felt secondary to thrombus.  Plan for Eliquis.  3.  Gastrointestinal bleed: GI consulted for upper GI bleed.  Currently on low-dose Eliquis.  Hemoglobin has remained stable.  Getting IV Protonix.   4.  Permanent atrial fibrillation: Rate controlled on Eliquis.  5.  Acute diastolic heart failure: Continue 40 mg of Lasix daily.  6.  Disposition: Transition from the unit 2 days ago.  Recommend inpatient rehab.  We Jazzmon Prindle continue  to follow for response to Eliquis.  For questions or updates, please contact [facility] Please consult [email or web address] for contact info under   Signed, Leiana Rund Jorja Loa, MD  02/10/2019, 12:10 PM

## 2019-02-10 NOTE — Progress Notes (Signed)
CBG 135  

## 2019-02-10 NOTE — Progress Notes (Signed)
CBG 104 for 12 pm

## 2019-02-10 NOTE — Evaluation (Signed)
Clinical/Bedside Swallow Evaluation Patient Details  Name: Joel Rogers MRN: [id number] Date of Birth: 26-Oct-1951  Today's Date: 02/10/2019 Time: SLP Start Time (ACUTE ONLY): 1005 SLP Stop Time (ACUTE ONLY): 1030 SLP Time Calculation (min) (ACUTE ONLY): 25 min  Past Medical History:  Past Medical History:  Diagnosis Date  . Anemia    low iron  . Arthritis    bilateral knees  . Atrial fibrillation, chronic   . Chronic diastolic CHF (congestive heart failure) (HCC) 06/15/2018  . History of subdural hematoma   . Hyperlipidemia   . Hypertension   . Morbid obesity (HCC)   . Normal coronary arteries    by cardiac catheterization performed 03/14/06  . Pre-diabetes    pt denies  . S/P TAVR (transcatheter aortic valve replacement)    a. 07/25/18: Edwards Sapien 3 THV (size 26 mm, model # 9600TFX, serial # [id number]) by Dr. Laneta Simmers and Dr. Clifton James  . Sleep apnea    on CPAP  . Venous insufficiency    Past Surgical History:  Past Surgical History:  Procedure Laterality Date  . BIOPSY  01/26/2019   Procedure: BIOPSY;  Surgeon: Bernette Redbird, MD;  Location: [facility] ENDOSCOPY;  Service: Endoscopy;;  . CRANIOTOMY Right 08/27/2016   Procedure: CRANIOTOMY HEMATOMA EVACUATION SUBDURAL;  Surgeon: Maeola Harman, MD;  Location: [facility] OR;  Service: Neurosurgery;  Laterality: Right;  . ESOPHAGOGASTRODUODENOSCOPY N/A 01/21/2019   Procedure: ESOPHAGOGASTRODUODENOSCOPY (EGD);  Surgeon: Vida Rigger, MD;  Location: [facility] ENDOSCOPY;  Service: Endoscopy;  Laterality: N/A;  . ESOPHAGOGASTRODUODENOSCOPY (EGD) WITH PROPOFOL N/A 01/22/2019   Procedure: ESOPHAGOGASTRODUODENOSCOPY (EGD) WITH PROPOFOL;  Surgeon: Bernette Redbird, MD;  Location: [facility] ENDOSCOPY;  Service: Endoscopy;  Laterality: N/A;  . ESOPHAGOGASTRODUODENOSCOPY (EGD) WITH PROPOFOL N/A 01/26/2019   Procedure: ESOPHAGOGASTRODUODENOSCOPY (EGD) WITH PROPOFOL;  Surgeon: Bernette Redbird, MD;  Location: [facility] ENDOSCOPY;  Service: Endoscopy;  Laterality: N/A;  . GASTRIC BYPASS   09/2008  . JOINT REPLACEMENT  08/31/2006   right hip  . LEFT HEART CATH AND CORONARY ANGIOGRAPHY N/A 02/05/2019   Procedure: LEFT HEART CATH AND CORONARY ANGIOGRAPHY;  Surgeon: Corky Crafts, MD;  Location: [facility] INVASIVE CV LAB;  Service: Cardiovascular;  Laterality: N/A;  . MULTIPLE EXTRACTIONS WITH ALVEOLOPLASTY N/A 07/13/2018   Procedure: Extraction of tooth #'s 3,8,10, 23-26, 30and 32 with alveoloplasty and gross debridement of remaining teeth;  Surgeon: Charlynne Pander, DDS;  Location: [facility] OR;  Service: Oral Surgery;  Laterality: N/A;  . RIGHT HEART CATH N/A 07/06/2018   Procedure: RIGHT HEART CATH;  Surgeon: Kathleene Hazel, MD;  Location: [facility] INVASIVE CV LAB;  Service: Cardiovascular;  Laterality: N/A;  . RIGHT/LEFT HEART CATH AND CORONARY ANGIOGRAPHY N/A 07/10/2018   Procedure: RIGHT/LEFT HEART CATH AND CORONARY ANGIOGRAPHY;  Surgeon: Dolores Patty, MD;  Location: [facility] INVASIVE CV LAB;  Service: Cardiovascular;  Laterality: N/A;  . TEE WITHOUT CARDIOVERSION N/A 07/25/2018   Procedure: TRANSESOPHAGEAL ECHOCARDIOGRAM (TEE);  Surgeon: Kathleene Hazel, MD;  Location: [facility] OR;  Service: Open Heart Surgery;  Laterality: N/A;  . TEE WITHOUT CARDIOVERSION N/A 01/18/2019   Procedure: TRANSESOPHAGEAL ECHOCARDIOGRAM (TEE) WITH PROPOFOL;  Surgeon: Jonelle Sidle, MD;  Location: [facility] ORS;  Service: Cardiovascular;  Laterality: N/A;  . TOTAL HIP ARTHROPLASTY     right hip  . TRANSCATHETER AORTIC VALVE REPLACEMENT, TRANSFEMORAL  07/25/2018  . TRANSCATHETER AORTIC VALVE REPLACEMENT, TRANSFEMORAL N/A 07/25/2018   Procedure: TRANSCATHETER AORTIC VALVE REPLACEMENT, TRANSFEMORAL;  Surgeon: Kathleene Hazel, MD;  Location: [facility] OR;  Service: Open Heart Surgery;  Laterality:  N/A;   HPI:  Pt is a 68 year old man who sustained a VF cardiac arrest on CIR 02/04/19. CPR with defibrillation x3 prior to ROSC. He was previously admitted 01/13/19 due to RUE/LE pain with sepsis and cellulitis.  During that admission, MRI revealed multi acute ischemic infarcts. PMH consists of a fib, CHF, h/o subdural hematoma, HTN, and TAVR placement 07/2018. He was on CIR 01/30/19 to 02/04/19. Until the cardiac arrest on 3/15 patient was tolerating a soft diet with thin liquids (endoscopy on 3/3 which revealed blood and blood clots in esophagus and stomach). On 3/4 he was only tolerating sips and chips due to the feeling of fullness, possibly related to finding on endoscopy. Patient now has a cortrak present to meet nutritional needs and is NPO.    Assessment / Plan / Recommendation Clinical Impression  Patient alert and cooperative. States he is ready to start eating. He had a sponge and water to keep his oral cavity moist on his bedside table and was using it when entering room. Patient's vocal quality was clear and storng. His volitional cough and swallow were also strong. Oral motor exam was [facility] bilaterally. Presentation of all consistencies and volumes were tolerated with no s/s of aspiration, clear vocal quality and no changes in respiration (ice chips, water, applesauce and cracker). He had a single belch at end of session, but no other signs of esophageal issues noted. Recommend patient begin regular diet with thin liquids. Medications can be given whole with liquids. Spoke with nurse about aspiration precautions and diet recommendations. Will follow up with patient for diet tolerance.  SLP Visit Diagnosis: Dysphagia, unspecified (R13.10)    Aspiration Risk  Mild aspiration risk    Diet Recommendation Regular;Thin liquid   Liquid Administration via: Cup;Straw Medication Administration: Whole meds with liquid Supervision: Staff to assist with self feeding Compensations: Minimize environmental distractions Postural Changes: Seated upright at 90 degrees;Remain upright for at least 30 minutes after po intake    Other  Recommendations Oral Care Recommendations: Oral care QID   Follow up Recommendations         Frequency and Duration min 1 x/week  1 week       Prognosis Prognosis for Safe Diet Advancement: Good      Swallow Study   General Date of Onset: 01/13/19 HPI: Pt is a 68 year old man who sustained a VF cardiac arrest on CIR 02/04/19. CPR with defibrillation x3 prior to ROSC. He was previously admitted 01/13/19 due to RUE/LE pain with sepsis and cellulitis. During that admission, MRI revealed multi acute ischemic infarcts. PMH consists of a fib, CHF, h/o subdural hematoma, HTN, and TAVR placement 07/2018. He was on CIR 01/30/19 to 02/04/19. Until the cardiac arrest on 3/15 patient was tolerating a soft diet with thin liquids (endoscopy on 3/3 which revealed blood and blood clots in esophagus and stomach). On 3/4 he was only tolerating sips and chips due to the feeling of fullness, possibly related to finding on endoscopy. Patient now has a cortrak present to meet nutritional needs and is NPO.  Type of Study: Bedside Swallow Evaluation Previous Swallow Assessment: 01/24/19 Bedside Diet Prior to this Study: NPO Temperature Spikes Noted: No Respiratory Status: Room air History of Recent Intubation: No Behavior/Cognition: Alert;Cooperative;Pleasant mood Oral Cavity Assessment: Within Functional Limits Oral Care Completed by SLP: Yes Oral Cavity - Dentition: Missing dentition(Patient reports he has dentures at home. Wife will bring the) Vision: Functional for self-feeding Self-Feeding Abilities: Able to feed self(right  hand weakness, but demonstrated ability to eat w/ left) Patient Positioning: Upright in bed Baseline Vocal Quality: Normal Volitional Cough: Strong Volitional Swallow: Able to elicit    Oral/Motor/Sensory Function Overall Oral Motor/Sensory Function: Within functional limits   Ice Chips Ice chips: Within functional limits Presentation: Spoon   Thin Liquid Thin Liquid: Within functional limits Presentation: Cup;Straw    Nectar Thick Nectar Thick Liquid: Not tested    Honey Thick Honey Thick Liquid: Not tested   Puree Puree: Within functional limits Presentation: Spoon;Self Fed   Solid     Solid: Within functional limits Other Comments: slow mastication, but pt and wife report this is normal     Lindalou Hose Elio Haden, MA, CCC-SLP 02/10/2019 11:45 AM

## 2019-02-10 NOTE — Progress Notes (Signed)
Per PA Meng stop tube feedings, let patient eat today and keep NG tube till tomorrow.  Kashtyn Jankowski, RN

## 2019-02-10 NOTE — Progress Notes (Signed)
CBG 122 this morning, did not cross over

## 2019-02-10 NOTE — Progress Notes (Signed)
Physical Therapy Treatment Patient Details Name: Joel Rogers MRN: [id number] DOB: Sep 26, 1951 Today's Date: 02/10/2019    History of Present Illness Pt is a 68 y.o. male admitted from CIR on 02/04/19 with VF cardiac arrest requiring CPR with defibrillation. Pt initially admitted 01/13/19 with sepsis and celluitis; MRI revealed multiple acute ischemic infarcts; pt d/c to CIR on 3/10. PMH includes CHF, afib, SDH, HTN, TAVR.   PT Comments    Pt progressing with mobility. Able to stand and transfer to recliner with maxA+2 and use of bariatric Stedy lift. Able to use RUE to eat, although limited by RUE impairments; encouraged continued attention to and movement of RUE/RLE. Pt remains motivated to participate. Recommend use of maximove for OOB transfers with nursing staff (RN/NT notified).    Follow Up Recommendations  CIR     Equipment Recommendations  Wheelchair (measurements PT);Wheelchair cushion (measurements PT);Hospital bed;Other (comment)    Recommendations for Other Services       Precautions / Restrictions Precautions Precautions: Fall Restrictions Weight Bearing Restrictions: No    Mobility  Bed Mobility Overal bed mobility: Needs Assistance Bed Mobility: Rolling;Sidelying to Sit Rolling: Mod assist Sidelying to sit: Max assist;+2 for safety/equipment       General bed mobility comments: MaxA to assist BLEs to EOB and assist trunk elevation; pt with c/o RLE pain but able to be redirected  Transfers Overall transfer level: Needs assistance   Transfers: Sit to/from Stand Sit to Stand: Max assist;+2 physical assistance         General transfer comment: MaxA+2 to stand with bari stedy, heavy use of bed pad to fit hips into seat; pt with very forward flexed posture, able to correct to more upright posture with max cues when going to sit  Ambulation/Gait             General Gait Details: unable   Stairs             Wheelchair Mobility    Modified  Rankin (Stroke Patients Only)       Balance Overall balance assessment: Needs assistance Sitting-balance support: Feet supported;Bilateral upper extremity supported Sitting balance-Leahy Scale: Fair Sitting balance - Comments: Min guard for balance     Standing balance-Leahy Scale: Zero                              Cognition Arousal/Alertness: Awake/alert Behavior During Therapy: WFL for tasks assessed/performed Overall Cognitive Status: Impaired/Different from baseline Area of Impairment: Attention;Memory;Following commands;Safety/judgement;Awareness;Problem solving                   Current Attention Level: Selective Memory: Decreased recall of precautions;Decreased short-term memory Following Commands: Follows one step commands inconsistently;Follows one step commands with increased time Safety/Judgement: Decreased awareness of safety;Decreased awareness of deficits Awareness: Intellectual Problem Solving: Slow processing;Difficulty sequencing;Decreased initiation;Requires verbal cues;Requires tactile cues General Comments: Frequent redirection to task, as pt focused on "I'm going to walk" then on eating lunch. Poor problem solving, but following commands and motivated to move      Exercises      General Comments General comments (skin integrity, edema, etc.): Wife present and supportive. Lunch tray arrived; pt automatically using L hand to eat, but able to initiate using RUE with encouragement, limited by impaired shoulder flexion, discoordination, hand swelling and decreased grip strength      Pertinent Vitals/Pain Pain Assessment: Faces Pain Score: 0-No pain Faces Pain Scale: Hurts little more Pain  Location: Chest/sternum Pain Descriptors / Indicators: Grimacing;Guarding;Sore Pain Intervention(s): Limited activity within patient's tolerance;Monitored during session    Home Living                      Prior Function            PT  Goals (current goals can now be found in the care plan section) Acute Rehab PT Goals Patient Stated Goal: back to CIR per wife PT Goal Formulation: With patient/family Time For Goal Achievement: 02/22/19 Potential to Achieve Goals: Fair Progress towards PT goals: Progressing toward goals    Frequency    Min 4X/week      PT Plan Current plan remains appropriate;Frequency needs to be updated    Co-evaluation              AM-PAC PT "6 Clicks" Mobility   Outcome Measure  Help needed turning from your back to your side while in a flat bed without using bedrails?: A Lot Help needed moving from lying on your back to sitting on the side of a flat bed without using bedrails?: A Lot Help needed moving to and from a bed to a chair (including a wheelchair)?: Total Help needed standing up from a chair using your arms (e.g., wheelchair or bedside chair)?: Total Help needed to walk in hospital room?: Total Help needed climbing 3-5 steps with a railing? : Total 6 Click Score: 8    End of Session Equipment Utilized During Treatment: Gait belt Activity Tolerance: Patient tolerated treatment well Patient left: in chair;with call bell/phone within reach;with chair alarm set;with family/visitor present;with nursing/sitter in room Nurse Communication: Mobility status;Need for lift equipment PT Visit Diagnosis: Unsteadiness on feet (R26.81);Other abnormalities of gait and mobility (R26.89);Muscle weakness (generalized) (M62.81)     Time: 5038-8828 PT Time Calculation (min) (ACUTE ONLY): 33 min  Charges:  $Therapeutic Activity: 8-22 mins $Neuromuscular Re-education: 8-22 mins                    Ina Homes, PT, DPT Acute Rehabilitation Services  Pager [phone] Office [phone]  Malachy Chamber 02/10/2019, 12:41 PM

## 2019-02-10 NOTE — Progress Notes (Signed)
Patient requested not to have his mitts put back on after having them off for a while.  Patient has not been attempting to pull on NG tube.  RN discussed with patient the dangers of pulling on tube to which he stated he would not touch it.

## 2019-02-10 NOTE — Progress Notes (Signed)
Patient tolerating dinner good as well.

## 2019-02-10 NOTE — Progress Notes (Signed)
CBG 122 at midnight.  Accu check results didn't cross over.

## 2019-02-10 NOTE — Progress Notes (Signed)
Tube feeding stopped for now, pt will order lunch

## 2019-02-11 LAB — BASIC METABOLIC PANEL
ANION GAP: 5 (ref 5–15)
BUN: 20 mg/dL (ref 8–23)
CO2: 25 mmol/L (ref 22–32)
Calcium: 8.1 mg/dL — ABNORMAL LOW (ref 8.9–10.3)
Chloride: 111 mmol/L (ref 98–111)
Creatinine, Ser: 0.8 mg/dL (ref 0.61–1.24)
GFR calc Af Amer: >60 mL/min (ref >60)
GFR calc non Af Amer: >60 mL/min (ref >60)
Glucose, Bld: 96 mg/dL (ref 70–99)
Potassium: 3.4 mmol/L — ABNORMAL LOW (ref 3.5–5.1)
Sodium: 141 mmol/L (ref 135–145)

## 2019-02-11 LAB — CBC
HCT: 25.2 % — ABNORMAL LOW (ref 39.0–52.0)
Hemoglobin: 7.9 g/dL — ABNORMAL LOW (ref 13.0–17.0)
MCH: 27.1 pg (ref 26.0–34.0)
MCHC: 31.3 g/dL (ref 30.0–36.0)
MCV: 86.3 fL (ref 80.0–100.0)
NRBC: 0.5 % — AB (ref 0.0–0.2)
Platelets: 224 10*3/uL (ref 150–400)
RBC: 2.92 MIL/uL — ABNORMAL LOW (ref 4.22–5.81)
RDW: 20.2 % — ABNORMAL HIGH (ref 11.5–15.5)
WBC: 15.4 10*3/uL — ABNORMAL HIGH (ref 4.0–10.5)

## 2019-02-11 MED ORDER — PRO-STAT SUGAR FREE PO LIQD
30.0000 mL | Freq: Two times a day (BID) | ORAL | Status: DC
  Administered 2019-02-11 – 2019-02-12 (×3): 30 mL via ORAL
  Filled 2019-02-11 (×3): qty 30

## 2019-02-11 MED ORDER — FUROSEMIDE 40 MG PO TABS
40.0000 mg | ORAL_TABLET | Freq: Every day | ORAL | Status: DC
  Administered 2019-02-11: 40 mg via ORAL
  Filled 2019-02-11: qty 1

## 2019-02-11 MED ORDER — AMLODIPINE BESYLATE 2.5 MG PO TABS
5.0000 mg | ORAL_TABLET | Freq: Every day | ORAL | Status: DC
  Administered 2019-02-11 – 2019-02-12 (×2): 5 mg via ORAL
  Filled 2019-02-11 (×2): qty 2

## 2019-02-11 MED ORDER — ATORVASTATIN CALCIUM 80 MG PO TABS
80.0000 mg | ORAL_TABLET | Freq: Every day | ORAL | Status: DC
  Administered 2019-02-11: 80 mg via ORAL
  Filled 2019-02-11: qty 1

## 2019-02-11 MED ORDER — TRAMADOL-ACETAMINOPHEN 37.5-325 MG PO TABS
1.0000 | ORAL_TABLET | Freq: Four times a day (QID) | ORAL | Status: DC | PRN
  Administered 2019-02-12 (×2): 1 via ORAL
  Filled 2019-02-11 (×2): qty 1

## 2019-02-11 MED ORDER — APIXABAN 2.5 MG PO TABS
2.5000 mg | ORAL_TABLET | Freq: Two times a day (BID) | ORAL | Status: DC
  Administered 2019-02-11: 2.5 mg via ORAL
  Filled 2019-02-11: qty 1

## 2019-02-11 MED ORDER — APIXABAN 5 MG PO TABS
5.0000 mg | ORAL_TABLET | Freq: Two times a day (BID) | ORAL | Status: DC
  Administered 2019-02-11 – 2019-02-12 (×2): 5 mg via ORAL
  Filled 2019-02-11 (×2): qty 1

## 2019-02-11 MED ORDER — METOPROLOL TARTRATE 50 MG PO TABS
50.0000 mg | ORAL_TABLET | Freq: Two times a day (BID) | ORAL | Status: DC
  Administered 2019-02-11 – 2019-02-12 (×3): 50 mg via ORAL
  Filled 2019-02-11 (×3): qty 1

## 2019-02-11 MED ORDER — POTASSIUM CHLORIDE CRYS ER 20 MEQ PO TBCR
40.0000 meq | EXTENDED_RELEASE_TABLET | Freq: Once | ORAL | Status: AC
  Administered 2019-02-11: 40 meq via ORAL

## 2019-02-11 MED ORDER — PANTOPRAZOLE SODIUM 40 MG PO TBEC
40.0000 mg | DELAYED_RELEASE_TABLET | Freq: Two times a day (BID) | ORAL | Status: DC
  Administered 2019-02-11 – 2019-02-12 (×3): 40 mg via ORAL
  Filled 2019-02-11 (×3): qty 1

## 2019-02-11 NOTE — Discharge Instructions (Signed)
Information on my medicine - ELIQUIS (apixaban)  This medication education was reviewed with me or my healthcare representative as part of my discharge preparation.  The pharmacist that spoke with me during my hospital stay was:  Lawerance Bach, [facility]  Why was Eliquis prescribed for you? Eliquis was prescribed for you to reduce the risk of forming blood clots that can cause a stroke if you have a medical condition called atrial fibrillation (a type of irregular heartbeat) OR to reduce the risk of a blood clots forming after orthopedic surgery.  What do You need to know about Eliquis ? Take your Eliquis TWICE DAILY - one tablet in the morning and one tablet in the evening with or without food.  It would be best to take the doses about the same time each day.  If you have difficulty swallowing the tablet whole please discuss with your pharmacist how to take the medication safely.  Take Eliquis exactly as prescribed by your doctor and DO NOT stop taking Eliquis without talking to the doctor who prescribed the medication.  Stopping may increase your risk of developing a new clot or stroke.  Refill your prescription before you run out.  After discharge, you should have regular check-up appointments with your healthcare provider that is prescribing your Eliquis.  In the future your dose may need to be changed if your kidney function or weight changes by a significant amount or as you get older.  What do you do if you miss a dose? If you miss a dose, take it as soon as you remember on the same day and resume taking twice daily.  Do not take more than one dose of ELIQUIS at the same time.  Important Safety Information A possible side effect of Eliquis is bleeding. You should call your healthcare provider right away if you experience any of the following: ? Bleeding from an injury or your nose that does not stop. ? Unusual colored urine (red or dark brown) or unusual colored stools (red or  black). ? Unusual bruising for unknown reasons. ? A serious fall or if you hit your head (even if there is no bleeding).  Some medicines may interact with Eliquis and might increase your risk of bleeding or clotting while on Eliquis. To help avoid this, consult your healthcare provider or pharmacist prior to using any new prescription or non-prescription medications, including herbals, vitamins, non-steroidal anti-inflammatory drugs (NSAIDs) and supplements.  This website has more information on Eliquis (apixaban): [email or web address].cy.

## 2019-02-11 NOTE — Progress Notes (Signed)
Progress Note  Patient Name: Joel Rogers Date of Encounter: 02/11/2019  Primary Cardiologist: Nanetta Batty, MD   Subjective   In bed watching TV.  No acute complaints at this time.  Tolerated his food yesterday without issue.  Worked well with physical therapy.  Inpatient Medications    Scheduled Meds: . amLODipine  5 mg Oral Daily  . apixaban  2.5 mg Oral BID  . atorvastatin  80 mg Oral q1800  . chlorhexidine  15 mL Mouth Rinse BID  . feeding supplement (PRO-STAT SUGAR FREE 64)  30 mL Oral BID  . furosemide  40 mg Oral Daily  . lip balm   Topical BID  . mouth rinse  15 mL Mouth Rinse BID  . mouth rinse  15 mL Mouth Rinse q12n4p  . metoprolol tartrate  50 mg Oral BID  . pantoprazole (PROTONIX) IV  40 mg Intravenous Q12H   Continuous Infusions: . feeding supplement (OSMOLITE 1.5 CAL) Stopped (02/10/19 1150)   PRN Meds: morphine injection, nitroGLYCERIN, ondansetron (ZOFRAN) IV, sodium chloride flush, traMADol-acetaminophen   Vital Signs    Vitals:   02/10/19 1334 02/10/19 2300 02/11/19 0520 02/11/19 0851  BP: 112/82 138/88 108/75 118/69  Pulse: 69 83 69 99  Resp: 18 20 18 18   Temp: 98 F (36.7 C) 98.8 F (37.1 C) 98.8 F (37.1 C)   TempSrc: Oral Oral Oral   SpO2: 98% 100% 94% 99%  Weight:  114.9 kg    Height:        Intake/Output Summary (Last 24 hours) at 02/11/2019 3734 Last data filed at 02/11/2019 0857 Gross per 24 hour  Intake 1020 ml  Output 1751 ml  Net -731 ml   Last 3 Weights 02/10/2019 02/10/2019 02/09/2019  Weight (lbs) 253 lb 6.4 oz 254 lb 6.6 oz 258 lb 2.5 oz  Weight (kg) 114.941 kg 115.4 kg 117.1 kg      Telemetry    Atrial fibrillation- Personally Reviewed  ECG    None new- Personally Reviewed  Physical Exam   GEN: Well nourished, well developed, in no acute distress  HEENT: normal  Neck: no JVD, carotid bruits, or masses Cardiac: irregular, no murmurs, rubs, or gallops,no edema  Respiratory:  clear to auscultation  bilaterally, normal work of breathing GI: soft, nontender, nondistended, + BS MS: no deformity or atrophy  Skin: warm and dry Neuro:  Strength and sensation are intact Psych: euthymic mood, full affect    Labs    Chemistry Recent Labs  Lab 02/07/19 0425 02/08/19 0400 02/10/19 0340 02/11/19 0409  NA 141 142 144 141  K 4.4 3.6 3.5 3.4*  CL 110 113* 114* 111  CO2 24 24 26 25   GLUCOSE 110* 133* 118* 96  BUN 34* 31* 20 20  CREATININE 1.12 0.97 0.83 0.80  CALCIUM 8.7* 8.3* 8.2* 8.1*  PROT 6.5  --   --   --   ALBUMIN 2.2*  --   --   --   AST 39  --   --   --   ALT 21  --   --   --   ALKPHOS 93  --   --   --   BILITOT 1.3*  --   --   --   GFRNONAA >60 >60 >60 >60  GFRAA >60 >60 >60 >60  ANIONGAP 7 5 4* 5     Hematology Recent Labs  Lab 02/09/19 0507 02/10/19 0340 02/11/19 0409  WBC 15.5* 13.7* 15.4*  RBC 2.87*  2.94* 2.92*  HGB 8.0* 7.9* 7.9*  HCT 24.7* 25.3* 25.2*  MCV 86.1 86.1 86.3  MCH 27.9 26.9 27.1  MCHC 32.4 31.2 31.3  RDW 20.4* 20.4* 20.2*  PLT 229 206 224    Cardiac Enzymes No results for input(s): TROPONINI in the last 168 hours. No results for input(s): TROPIPOC in the last 168 hours.   BNP No results for input(s): BNP, PROBNP in the last 168 hours.   DDimer No results for input(s): DDIMER in the last 168 hours.   Radiology    No results found.  Cardiac Studies   TTE: 02/05/2019  . The left ventricle has hyperdynamic systolic function, with an ejection fraction of >65%. The cavity size was normal. There is moderate concentric left ventricular hypertrophy. Left ventricular diastolic function could not be evaluated secondary to  atrial fibrillation. No evidence of left ventricular regional wall motion abnormalities.  2. The right ventricle has mildly reduced systolic function. The cavity was normal. There is no increase in right ventricular wall thickness. Right ventricular systolic pressure is severely elevated with an estimated pressure of 63.3  mmHg.  3. Left atrial size was severely dilated.  4. Right atrial size was severely dilated.  5. Small pericardial effusion.  6. The pericardial effusion is posterior to the left ventricle and the left atrium.  7. There is mild mitral annular calcification present.  8. Tricuspid valve regurgitation is mild-moderate.  9. A 26 an Edwards Edwards Sapien bioprosthetic aortic valve (TAVR) valve is present in the aortic position. Normal aortic valve prosthesis. 10. The pulmonic valve was grossly normal. Pulmonic valve regurgitation is mild to moderate is mild by color flow Doppler. 11. The inferior vena cava was dilated in size with <50% respiratory variability. 12. Possible small PFO. 13. There is right bowing of the interatrial septum, suggestive of elevated left atrial pressure.  Cath: 02/05/2019   Lat Ramus lesion is 99% stenosed. Heavy thrombus.  No other significant CAD.   D/w Dr. Excell Seltzer.  Restart IV heparin tonight and if Hbg stable, [name] restart Eliquis tomorrow.   Medical therapy for ramus disease.    Patient Profile     68 y.o. male with history of severe aortic stenosis and TAVR approximately 6 months ago. He presents now with large non-STEMI troponin greater than 65 and V. fib cardiac arrest. This occurred after recent cardioembolic stroke without clear source of embolism by TEE evaluation.  Assessment & Plan    1.  Stroke/altered mental status: Status post neurology evaluation.  Answering questions appropriately.  Is passed his swallow study.  [name] remove NG tube today.  Currently on Eliquis.    2.  Non-STEMI: Thought secondary to thrombus.  Continue Eliquis.  3.  Gastrointestinal bleed: GI consulted for GI bleed.  We Holle Sprick switch Protonix to p.o.  Hemoglobin has remained stable.    4.  Permanent atrial fibrillation: Rate controlled on Eliquis  5.  Acute diastolic heart failure: Continue Lasix daily  6.  Disposition: Joel Rogers likely go to inpatient rehab.  We Joel Rogers  switch to 5 mg of Eliquis today, and if he remains stable, may be able to go to rehab tomorrow.  For questions or updates, please contact [facility] Please consult [email or web address] for contact info under   Signed, Bryan Goin Jorja Loa, MD  02/11/2019, 9:21 AM

## 2019-02-11 NOTE — Progress Notes (Signed)
D/CC NG tube per MD order.  Contacted [facility] team on call, not available today. 2H RN Nelma Rothman removed NG tube. No issues.

## 2019-02-12 ENCOUNTER — Inpatient Hospital Stay (HOSPITAL_COMMUNITY)
Admission: RE | Admit: 2019-02-12 | Discharge: 2019-03-13 | DRG: 056 | Disposition: A | Discharge status: Home Health | Payer: Medicare Other | Source: Intra-hospital | Attending: Physical Medicine & Rehabilitation | Admitting: Physical Medicine & Rehabilitation

## 2019-02-12 ENCOUNTER — Encounter (HOSPITAL_COMMUNITY): Payer: Self-pay | Admitting: *Deleted

## 2019-02-12 ENCOUNTER — Other Ambulatory Visit: Payer: Self-pay

## 2019-02-12 ENCOUNTER — Encounter (HOSPITAL_COMMUNITY): Payer: Self-pay | Admitting: Physician Assistant

## 2019-02-12 DIAGNOSIS — R269 Unspecified abnormalities of gait and mobility: Secondary | ICD-10-CM | POA: Diagnosis not present

## 2019-02-12 DIAGNOSIS — G4733 Obstructive sleep apnea (adult) (pediatric): Secondary | ICD-10-CM | POA: Diagnosis present

## 2019-02-12 DIAGNOSIS — Z953 Presence of xenogenic heart valve: Secondary | ICD-10-CM

## 2019-02-12 DIAGNOSIS — D72823 Leukemoid reaction: Secondary | ICD-10-CM | POA: Diagnosis not present

## 2019-02-12 DIAGNOSIS — K922 Gastrointestinal hemorrhage, unspecified: Secondary | ICD-10-CM | POA: Diagnosis not present

## 2019-02-12 DIAGNOSIS — D72829 Elevated white blood cell count, unspecified: Secondary | ICD-10-CM

## 2019-02-12 DIAGNOSIS — I10 Essential (primary) hypertension: Secondary | ICD-10-CM

## 2019-02-12 DIAGNOSIS — Z87891 Personal history of nicotine dependence: Secondary | ICD-10-CM

## 2019-02-12 DIAGNOSIS — I110 Hypertensive heart disease with heart failure: Secondary | ICD-10-CM | POA: Diagnosis present

## 2019-02-12 DIAGNOSIS — I6350 Cerebral infarction due to unspecified occlusion or stenosis of unspecified cerebral artery: Secondary | ICD-10-CM | POA: Diagnosis present

## 2019-02-12 DIAGNOSIS — I6349 Cerebral infarction due to embolism of other cerebral artery: Secondary | ICD-10-CM

## 2019-02-12 DIAGNOSIS — E876 Hypokalemia: Secondary | ICD-10-CM | POA: Diagnosis not present

## 2019-02-12 DIAGNOSIS — Z8249 Family history of ischemic heart disease and other diseases of the circulatory system: Secondary | ICD-10-CM | POA: Diagnosis not present

## 2019-02-12 DIAGNOSIS — Z820 Family history of epilepsy and other diseases of the nervous system: Secondary | ICD-10-CM

## 2019-02-12 DIAGNOSIS — Z888 Allergy status to other drugs, medicaments and biological substances status: Secondary | ICD-10-CM | POA: Diagnosis not present

## 2019-02-12 DIAGNOSIS — Z9884 Bariatric surgery status: Secondary | ICD-10-CM

## 2019-02-12 DIAGNOSIS — I69398 Other sequelae of cerebral infarction: Secondary | ICD-10-CM | POA: Diagnosis not present

## 2019-02-12 DIAGNOSIS — M25569 Pain in unspecified knee: Secondary | ICD-10-CM

## 2019-02-12 DIAGNOSIS — Z96641 Presence of right artificial hip joint: Secondary | ICD-10-CM | POA: Diagnosis not present

## 2019-02-12 DIAGNOSIS — I5033 Acute on chronic diastolic (congestive) heart failure: Secondary | ICD-10-CM | POA: Diagnosis not present

## 2019-02-12 DIAGNOSIS — Z8674 Personal history of sudden cardiac arrest: Secondary | ICD-10-CM | POA: Diagnosis not present

## 2019-02-12 DIAGNOSIS — Z7901 Long term (current) use of anticoagulants: Secondary | ICD-10-CM | POA: Diagnosis not present

## 2019-02-12 DIAGNOSIS — M170 Bilateral primary osteoarthritis of knee: Secondary | ICD-10-CM | POA: Diagnosis present

## 2019-02-12 DIAGNOSIS — E785 Hyperlipidemia, unspecified: Secondary | ICD-10-CM | POA: Diagnosis not present

## 2019-02-12 DIAGNOSIS — I5032 Chronic diastolic (congestive) heart failure: Secondary | ICD-10-CM | POA: Diagnosis not present

## 2019-02-12 DIAGNOSIS — D62 Acute posthemorrhagic anemia: Secondary | ICD-10-CM | POA: Diagnosis not present

## 2019-02-12 DIAGNOSIS — Z803 Family history of malignant neoplasm of breast: Secondary | ICD-10-CM | POA: Diagnosis not present

## 2019-02-12 DIAGNOSIS — I69351 Hemiplegia and hemiparesis following cerebral infarction affecting right dominant side: Secondary | ICD-10-CM | POA: Diagnosis not present

## 2019-02-12 DIAGNOSIS — I482 Chronic atrial fibrillation, unspecified: Secondary | ICD-10-CM | POA: Diagnosis present

## 2019-02-12 DIAGNOSIS — Z7982 Long term (current) use of aspirin: Secondary | ICD-10-CM

## 2019-02-12 DIAGNOSIS — I214 Non-ST elevation (NSTEMI) myocardial infarction: Secondary | ICD-10-CM | POA: Diagnosis not present

## 2019-02-12 HISTORY — DX: Cerebral infarction, unspecified: I63.9

## 2019-02-12 LAB — GLUCOSE, CAPILLARY
GLUCOSE-CAPILLARY: 104 mg/dL — AB (ref 70–99)
GLUCOSE-CAPILLARY: 122 mg/dL — AB (ref 70–99)
Glucose-Capillary: 132 mg/dL — ABNORMAL HIGH (ref 70–99)
Glucose-Capillary: 128 mg/dL — ABNORMAL HIGH (ref 70–99)
Glucose-Capillary: 135 mg/dL — ABNORMAL HIGH (ref 70–99)
Glucose-Capillary: 112 mg/dL — ABNORMAL HIGH (ref 70–99)
Glucose-Capillary: 122 mg/dL — ABNORMAL HIGH (ref 70–99)

## 2019-02-12 LAB — CBC
HCT: 23.8 % — ABNORMAL LOW (ref 39.0–52.0)
Hemoglobin: 7.8 g/dL — ABNORMAL LOW (ref 13.0–17.0)
MCH: 28.3 pg (ref 26.0–34.0)
MCHC: 32.8 g/dL (ref 30.0–36.0)
MCV: 86.2 fL (ref 80.0–100.0)
Platelets: 224 10*3/uL (ref 150–400)
RBC: 2.76 MIL/uL — ABNORMAL LOW (ref 4.22–5.81)
RDW: 20.1 % — ABNORMAL HIGH (ref 11.5–15.5)
WBC: 15.3 10*3/uL — ABNORMAL HIGH (ref 4.0–10.5)
nRBC: 0.3 % — ABNORMAL HIGH (ref 0.0–0.2)

## 2019-02-12 MED ORDER — SORBITOL 70 % SOLN: 30.0000 mL | Freq: Every day | Status: DC | PRN

## 2019-02-12 MED ORDER — FUROSEMIDE 10 MG/ML IJ SOLN
40.0000 mg | Freq: Once | INTRAMUSCULAR | Status: AC
  Administered 2019-02-12: 40 mg via INTRAVENOUS
  Filled 2019-02-12: qty 4

## 2019-02-12 MED ORDER — METOPROLOL TARTRATE 50 MG PO TABS: 50.0000 mg | ORAL_TABLET | Freq: Two times a day (BID) | ORAL | 11 refills | Status: DC

## 2019-02-12 MED ORDER — LIP MEDEX EX OINT: TOPICAL_OINTMENT | Freq: Two times a day (BID) | CUTANEOUS | 0 refills | Status: DC

## 2019-02-12 MED ORDER — LIP MEDEX EX OINT
TOPICAL_OINTMENT | Freq: Two times a day (BID) | CUTANEOUS | Status: DC
  Administered 2019-02-12 – 2019-02-19 (×11): via TOPICAL
  Administered 2019-02-20: 1 via TOPICAL
  Administered 2019-02-20: 08:00:00 via TOPICAL
  Administered 2019-02-21: 1 via TOPICAL
  Administered 2019-02-21 – 2019-02-26 (×7): via TOPICAL
  Administered 2019-02-27: 1 via TOPICAL
  Administered 2019-02-27 – 2019-03-02 (×4): via TOPICAL
  Administered 2019-03-03: 1 via TOPICAL
  Administered 2019-03-03 – 2019-03-08 (×3): via TOPICAL
  Administered 2019-03-09: 1 via TOPICAL
  Administered 2019-03-09 – 2019-03-10 (×3): via TOPICAL
  Administered 2019-03-11: 1 via TOPICAL
  Administered 2019-03-11: 07:00:00 via TOPICAL
  Administered 2019-03-12 (×2): 1 via TOPICAL
  Administered 2019-03-13: 09:00:00 via TOPICAL
  Filled 2019-02-12: qty 7

## 2019-02-12 MED ORDER — POTASSIUM CHLORIDE CRYS ER 20 MEQ PO TBCR
40.0000 meq | EXTENDED_RELEASE_TABLET | Freq: Once | ORAL | Status: AC
  Administered 2019-02-12: 40 meq via ORAL
  Filled 2019-02-12: qty 2

## 2019-02-12 MED ORDER — PRO-STAT SUGAR FREE PO LIQD: 30.0000 mL | Freq: Two times a day (BID) | ORAL | 0 refills | Status: DC

## 2019-02-12 MED ORDER — TRAMADOL-ACETAMINOPHEN 37.5-325 MG PO TABS
1.0000 | ORAL_TABLET | Freq: Four times a day (QID) | ORAL | Status: DC | PRN
  Administered 2019-02-13 – 2019-02-26 (×6): 1 via ORAL
  Filled 2019-02-12 (×7): qty 1

## 2019-02-12 MED ORDER — NITROGLYCERIN 0.4 MG SL SUBL: 0.4000 mg | SUBLINGUAL_TABLET | SUBLINGUAL | Status: DC | PRN

## 2019-02-12 MED ORDER — PRO-STAT SUGAR FREE PO LIQD
30.0000 mL | Freq: Two times a day (BID) | ORAL | Status: DC
  Administered 2019-02-12 – 2019-03-08 (×36): 30 mL via ORAL
  Filled 2019-02-12 (×49): qty 30

## 2019-02-12 MED ORDER — AMLODIPINE BESYLATE 5 MG PO TABS: 5.0000 mg | ORAL_TABLET | Freq: Every day | ORAL | 11 refills | Status: DC

## 2019-02-12 MED ORDER — COLCHICINE 0.6 MG PO TABS: 0.6000 mg | ORAL_TABLET | ORAL | Status: AC | PRN

## 2019-02-12 MED ORDER — FUROSEMIDE 40 MG PO TABS
40.0000 mg | ORAL_TABLET | Freq: Every day | ORAL | Status: DC
  Administered 2019-02-13 – 2019-03-12 (×28): 40 mg via ORAL
  Filled 2019-02-12 (×30): qty 1

## 2019-02-12 MED ORDER — PANTOPRAZOLE SODIUM 40 MG PO TBEC
40.0000 mg | DELAYED_RELEASE_TABLET | Freq: Two times a day (BID) | ORAL | Status: DC
  Administered 2019-02-12 – 2019-03-13 (×58): 40 mg via ORAL
  Filled 2019-02-12 (×58): qty 1

## 2019-02-12 MED ORDER — AMLODIPINE BESYLATE 5 MG PO TABS
5.0000 mg | ORAL_TABLET | Freq: Every day | ORAL | Status: DC
  Administered 2019-02-13 – 2019-03-13 (×29): 5 mg via ORAL
  Filled 2019-02-12 (×29): qty 1

## 2019-02-12 MED ORDER — ATORVASTATIN CALCIUM 80 MG PO TABS
80.0000 mg | ORAL_TABLET | Freq: Every day | ORAL | Status: DC
  Administered 2019-02-12 – 2019-03-12 (×29): 80 mg via ORAL
  Filled 2019-02-12 (×29): qty 1

## 2019-02-12 MED ORDER — TRAMADOL-ACETAMINOPHEN 37.5-325 MG PO TABS: 1.0000 | ORAL_TABLET | Freq: Four times a day (QID) | ORAL | 0 refills | Status: DC | PRN

## 2019-02-12 MED ORDER — PANTOPRAZOLE SODIUM 40 MG PO TBEC: 40.0000 mg | DELAYED_RELEASE_TABLET | Freq: Two times a day (BID) | ORAL | 0 refills | Status: DC

## 2019-02-12 MED ORDER — APIXABAN 5 MG PO TABS
5.0000 mg | ORAL_TABLET | Freq: Two times a day (BID) | ORAL | Status: DC
  Administered 2019-02-12 – 2019-03-13 (×58): 5 mg via ORAL
  Filled 2019-02-12 (×58): qty 1

## 2019-02-12 MED ORDER — FUROSEMIDE 40 MG PO TABS: 40.0000 mg | ORAL_TABLET | Freq: Every day | ORAL | Status: DC

## 2019-02-12 MED ORDER — NITROGLYCERIN 0.4 MG SL SUBL: 0.4000 mg | SUBLINGUAL_TABLET | SUBLINGUAL | 12 refills | Status: AC | PRN

## 2019-02-12 MED ORDER — FUROSEMIDE 40 MG PO TABS: 40.0000 mg | ORAL_TABLET | Freq: Every day | ORAL | 11 refills | Status: DC

## 2019-02-12 MED ORDER — METOPROLOL TARTRATE 50 MG PO TABS
50.0000 mg | ORAL_TABLET | Freq: Two times a day (BID) | ORAL | Status: DC
  Administered 2019-02-12 – 2019-03-13 (×58): 50 mg via ORAL
  Filled 2019-02-12 (×58): qty 1

## 2019-02-12 MED ORDER — BLOOD PRESSURE CONTROL BOOK
Freq: Once | Status: AC
  Administered 2019-02-12: 18:00:00

## 2019-02-12 NOTE — H&P (Signed)
Physical Medicine and Rehabilitation Admission H&P    Chief complaint: weakness  HPI: Joel Rogers is a 68 year old right-handed male with history of diastolic congestive heart failure, aortic stenosis with TAVR procedure September 2019,atrial fibrillation maintained on Eliquis, OSA with CPAP, hypertension. Per chart review patient lives with spouse community ambulator and active prior to admission. Initially presented to [facility] 01/13/2019 secondary to right upper extremity pain and recent fall with right sided weakness findings of sepsis due to Staphylococcus bacteremia and cellulitis during admission maintain on antibiotic therapy. Noted persistent right side weakness. MRI the brain revealed multiple acute ischemic infarcts in a pattern most consistent with cardioembolic stroke. He was transferred to [facility] for further evaluation. Venous Doppler studies negative for DVTs. Follow-up MRI the brain with numerous scattered foci of acute ischemia throughout the cerebrum and cerebellum. No midline shift or mass effect. No proximal intracranial arterial occlusion. Echocardiogram with ejection fraction of 60% and normal systolic function. TEE showed no vegetation on bioprosthetic aortic valve. Patient remained on eliquis for atrial fibrillation with the addition of aspirin for CVA prophylaxis. He developed coffee-ground emesis with acute blood loss anemia requiring transfusion totaling 6 units pack red blood cells with gastroenterology consulted for EGD completed 2 with findings of red blood in the lower third of the esophagus clotted blood in the entire stomach. Blood thinners were held and follow-up GI endoscopy again completed 01/26/2019 showing no active bleeding or blood present. Recommendations were to hold aspirin and eliquis for approximately 3-5 days and then resume. He was placed on Protonix twice daily.hospital course complicated by AKI with creatinine baseline 1.1-1.23  elevated to 1.95 renal services consulted likely secondary to ischemic ATN in the setting of sepsis maintained on gentle IV fluids creatinine improved to 1.00. Therapy evaluations completed patient was admitted for a comprehensive rehabilitation program 01/30/2019. On 02/03/2019 patient with burning chest pain felt like acid reflux he did receive a dose of Maalox. Denied shortness of breath. Patient became unresponsive and code was called and noted to be in ventricular fibrillation. Troponin was noted to be elevated. He was discharged to acute care services 02/03/2019 maintain on intravenous heparin and followed by cardiology services. Echocardiogram was repeated with ejection fraction greater than 65% hyperdynamic systolic function.cardiac catheterization same day showed  Lat Ramus lesion 99% stenosed. Heavy thrombus. No other significant CAD. His Eliquis was resumed and monitor for any bleeding episodes. Nasogastric tube initially in place and diet has been advanced to regular consistency. Therapies have been resumed patient tolerating nicely and was readmitted back to inpatient rehabilitation services.  Review of Systems  Constitutional: Negative for chills and fever.  HENT: Negative for hearing loss.   Eyes: Negative for blurred vision and double vision.  Respiratory: Positive for cough and wheezing.   Cardiovascular: Positive for palpitations and leg swelling.  Gastrointestinal: Positive for blood in stool. Negative for abdominal pain, heartburn, nausea and vomiting.  Genitourinary: Negative for dysuria, flank pain and hematuria.  Musculoskeletal: Positive for myalgias.  Skin: Negative for rash.  Neurological: Positive for dizziness and weakness. Negative for seizures.       Occasional headache  Psychiatric/Behavioral: The patient has insomnia.   All other systems reviewed and are negative.  Past Medical History:  Diagnosis Date   Anemia    low iron   Arthritis    bilateral knees    Atrial fibrillation, chronic    Chronic diastolic CHF (congestive heart failure) (HCC) 06/15/2018   History of subdural  hematoma    Hyperlipidemia    Hypertension    Morbid obesity (HCC)    Normal coronary arteries    by cardiac catheterization performed 03/14/06   Pre-diabetes    pt denies   S/P TAVR (transcatheter aortic valve replacement)    a. 07/25/18: Edwards Sapien 3 THV (size 26 mm, model # 9600TFX, serial # [id number]) by Dr. Laneta Simmers and Dr. Clifton James   Sleep apnea    on CPAP   Venous insufficiency    Past Surgical History:  Procedure Laterality Date   BIOPSY  01/26/2019   Procedure: BIOPSY;  Surgeon: Bernette Redbird, MD;  Location: [facility] ENDOSCOPY;  Service: Endoscopy;;   CRANIOTOMY Right 08/27/2016   Procedure: CRANIOTOMY HEMATOMA EVACUATION SUBDURAL;  Surgeon: Maeola Harman, MD;  Location: [facility] OR;  Service: Neurosurgery;  Laterality: Right;   ESOPHAGOGASTRODUODENOSCOPY N/A 01/21/2019   Procedure: ESOPHAGOGASTRODUODENOSCOPY (EGD);  Surgeon: Vida Rigger, MD;  Location: [facility] ENDOSCOPY;  Service: Endoscopy;  Laterality: N/A;   ESOPHAGOGASTRODUODENOSCOPY (EGD) WITH PROPOFOL N/A 01/22/2019   Procedure: ESOPHAGOGASTRODUODENOSCOPY (EGD) WITH PROPOFOL;  Surgeon: Bernette Redbird, MD;  Location: [facility] ENDOSCOPY;  Service: Endoscopy;  Laterality: N/A;   ESOPHAGOGASTRODUODENOSCOPY (EGD) WITH PROPOFOL N/A 01/26/2019   Procedure: ESOPHAGOGASTRODUODENOSCOPY (EGD) WITH PROPOFOL;  Surgeon: Bernette Redbird, MD;  Location: [facility] ENDOSCOPY;  Service: Endoscopy;  Laterality: N/A;   GASTRIC BYPASS  09/2008   JOINT REPLACEMENT  08/31/2006   right hip   LEFT HEART CATH AND CORONARY ANGIOGRAPHY N/A 02/05/2019   Procedure: LEFT HEART CATH AND CORONARY ANGIOGRAPHY;  Surgeon: Corky Crafts, MD;  Location: [facility] INVASIVE CV LAB;  Service: Cardiovascular;  Laterality: N/A;   MULTIPLE EXTRACTIONS WITH ALVEOLOPLASTY N/A 07/13/2018   Procedure: Extraction of tooth #'s 3,8,10, 23-26, 30and 32 with alveoloplasty  and gross debridement of remaining teeth;  Surgeon: Charlynne Pander, DDS;  Location: [facility] OR;  Service: Oral Surgery;  Laterality: N/A;   RIGHT HEART CATH N/A 07/06/2018   Procedure: RIGHT HEART CATH;  Surgeon: Kathleene Hazel, MD;  Location: [facility] INVASIVE CV LAB;  Service: Cardiovascular;  Laterality: N/A;   RIGHT/LEFT HEART CATH AND CORONARY ANGIOGRAPHY N/A 07/10/2018   Procedure: RIGHT/LEFT HEART CATH AND CORONARY ANGIOGRAPHY;  Surgeon: Dolores Patty, MD;  Location: [facility] INVASIVE CV LAB;  Service: Cardiovascular;  Laterality: N/A;   TEE WITHOUT CARDIOVERSION N/A 07/25/2018   Procedure: TRANSESOPHAGEAL ECHOCARDIOGRAM (TEE);  Surgeon: Kathleene Hazel, MD;  Location: [facility] OR;  Service: Open Heart Surgery;  Laterality: N/A;   TEE WITHOUT CARDIOVERSION N/A 01/18/2019   Procedure: TRANSESOPHAGEAL ECHOCARDIOGRAM (TEE) WITH PROPOFOL;  Surgeon: Jonelle Sidle, MD;  Location: [facility] ORS;  Service: Cardiovascular;  Laterality: N/A;   TOTAL HIP ARTHROPLASTY     right hip   TRANSCATHETER AORTIC VALVE REPLACEMENT, TRANSFEMORAL  07/25/2018   TRANSCATHETER AORTIC VALVE REPLACEMENT, TRANSFEMORAL N/A 07/25/2018   Procedure: TRANSCATHETER AORTIC VALVE REPLACEMENT, TRANSFEMORAL;  Surgeon: Kathleene Hazel, MD;  Location: [facility] OR;  Service: Open Heart Surgery;  Laterality: N/A;   Family History  Problem Relation Age of Onset   Hypertension Father    Cancer Father        prob prostate   Alzheimer's disease Mother    Alzheimer's disease Maternal Grandfather    Breast cancer Sister    Cancer Brother        "brain tumor"   Social History:  reports that he has quit smoking. He has never used smokeless tobacco. He reports that he does not drink alcohol or use drugs. Allergies:  Allergies  Allergen Reactions   Kayexalate [Polystyrene] Other (See Comments)    Recommended to avoid by GI due to ischemic gastropathy   Medications Prior to Admission  Medication Sig Dispense Refill     acetaminophen (TYLENOL) 650 MG CR tablet Take 650 mg by mouth 2 (two) times daily as needed for pain.     aspirin 81 MG chewable tablet Chew 1 tablet (81 mg total) by mouth daily.     atorvastatin (LIPITOR) 80 MG tablet TAKE 1 TABLET BY MOUTH ONCE DAILY WITH  BREAKFAST (Patient taking differently: Take 80 mg by mouth daily. ) 90 tablet 1   diltiazem (CARDIZEM CD) 120 MG 24 hr capsule Take 1 capsule (120 mg total) by mouth daily. 90 capsule 1   ergocalciferol (VITAMIN D2) 1.25 MG (50000 UT) capsule Take 50,000 Units by mouth once a week.     spironolactone (ALDACTONE) 25 MG tablet Take 0.5 tablets (12.5 mg total) by mouth daily. 30 tablet 5   torsemide (DEMADEX) 20 MG tablet Take 40 mg by mouth as needed. Use as needed if weight is over 245 pounds.     amoxicillin (AMOXIL) 500 MG capsule Take 2,000 mg (4 tablets) one hour prior to dental visits. (Patient not taking: Reported on 02/03/2019) 8 capsule 3   apixaban (ELIQUIS) 5 MG TABS tablet Take 1 tablet (5 mg total) by mouth 2 (two) times daily. (Patient not taking: Reported on 02/03/2019) 180 tablet 1   colchicine 0.6 MG tablet TAKE 1 TABLET BY MOUTH TWICE DAILY (Patient taking differently: Take 0.6 mg by mouth as needed (Gout flare up.). ) 60 tablet 2    Drug Regimen Review Drug regimen was reviewed and remains appropriate with no significant issues identified  Home: Home Living Family/patient expects to be discharged to:: Private residence Living Arrangements: Spouse/significant other Available Help at Discharge: Family, Available 24 hours/day Type of Home: House Home Access: Stairs to enter [employer] of Steps: 3 Entrance Stairs-Rails: Right Home Layout: One level Bathroom Shower/Tub: Tub/shower unit, Health visitor: Handicapped height Bathroom Accessibility: Yes Home Equipment: Environmental consultant - 2 wheels, Cane - single point  Lives With: Spouse   Functional History: Prior Function Level of  Independence: Independent Comments: community ambulator, drives, was actively going to [employer] and using the pool there. This was prior to Feb 2020 admission.  Functional Status:  Mobility: Bed Mobility Overal bed mobility: Needs Assistance Bed Mobility: Rolling, Sidelying to Sit Rolling: Mod assist Sidelying to sit: Max assist, +2 for safety/equipment Supine to sit: Max assist Sit to supine: Max assist, +2 for physical assistance(NT called in to assist) General bed mobility comments: MaxA to assist BLEs to EOB and assist trunk elevation; pt with c/o RLE pain but able to be redirected Transfers Overall transfer level: Needs assistance Equipment used: 1 person hand held assist Transfer via Lift Equipment: Stedy Transfers: Sit to/from [employer] to Stand: Max assist, +2 physical assistance General transfer comment: MaxA+2 to stand with bari stedy, heavy use of bed pad to fit hips into seat; pt with very forward flexed posture, able to correct to more upright posture with max cues when going to sit Ambulation/Gait General Gait Details: unable    ADL: ADL Overall ADL's : Needs assistance/impaired Eating/Feeding: NPO Grooming: Set up, Sitting, Supervision/safety Grooming Details (indicate cue type and reason): requires multiple cues for attention Upper Body Bathing: Minimal assistance, Sitting Upper Body Bathing Details (indicate cue type and reason): simulated through rubbing in lotion on arms Lower Body Bathing: Moderate  assistance, Sitting/lateral leans Upper Body Dressing : Moderate assistance, Sitting Lower Body Dressing: Total assistance Toilet Transfer: Maximal assistance, +2 for physical assistance Toileting- Clothing Manipulation and Hygiene: Total assistance, +2 for physical assistance, +2 for safety/equipment, Bed level Functional mobility during ADLs: Maximal assistance, +2 for physical assistance, +2 for safety/equipment General ADL Comments: 2 person face to face  transfer limited by hip pain, but able to clear bottom off of bed  Cognition: Cognition Overall Cognitive Status: Impaired/Different from baseline Orientation Level: Oriented X4 Cognition Arousal/Alertness: Awake/alert Behavior During Therapy: WFL for tasks assessed/performed Overall Cognitive Status: Impaired/Different from baseline Area of Impairment: Attention, Memory, Following commands, Safety/judgement, Awareness, Problem solving Current Attention Level: Selective Memory: Decreased recall of precautions, Decreased short-term memory Following Commands: Follows one step commands inconsistently, Follows one step commands with increased time Safety/Judgement: Decreased awareness of safety, Decreased awareness of deficits Awareness: Intellectual Problem Solving: Slow processing, Difficulty sequencing, Decreased initiation, Requires verbal cues, Requires tactile cues General Comments: Frequent redirection to task, as pt focused on "I'm going to walk" then on eating lunch. Poor problem solving, but following commands and motivated to move  Physical Exam: Blood pressure 127/82, pulse 78, temperature 98.3 F (36.8 C), temperature source Oral, resp. rate 16, height  (1.854 m), weight 117 kg, SpO2 100 %. Physical Exam  Constitutional: He is oriented to person, place, and time. No distress.  HENT:  Head: Normocephalic and atraumatic.  Eyes: Pupils are equal, round, and reactive to light. EOM are normal.  Neck: Normal range of motion.  Cardiovascular: Normal rate and regular rhythm.  Murmur heard. Respiratory: Effort normal and breath sounds normal. No respiratory distress. He has no wheezes.  GI: Soft. He exhibits no distension. There is no abdominal tenderness.  Musculoskeletal:     Comments: RUE 1+ edema. Chronic pitting edema bilateral LE, RIght more than left.   Neurological: He is alert and oriented to person, place, and time.  Reasonable insight and awareness. Normal CN exam.  RUE 3+ to 4-/5 prox to distal. LUE 4/5. RLE 1-2/5. LLE 1+ to 2/5 prox to distal. Senses light touch and pain in all 4's, decreased in distal LE's.      Skin: Skin is warm. He is not diaphoretic.  Psychiatric: He has a normal mood and affect. His behavior is normal. Judgment and thought content normal.    Results for orders placed or performed during the hospital encounter of 02/03/19 (from the past 48 hour(s))  CBC     Status: Abnormal   Collection Time: 02/11/19  4:09 AM  Result Value Ref Range   WBC 15.4 (H) 4.0 - 10.5 K/uL   RBC 2.92 (L) 4.22 - 5.81 MIL/uL   Hemoglobin 7.9 (L) 13.0 - 17.0 g/dL   HCT 16.1 (L) 09.6 - 04.5 %   MCV 86.3 80.0 - 100.0 fL   MCH 27.1 26.0 - 34.0 pg   MCHC 31.3 30.0 - 36.0 g/dL   RDW 40.9 (H) 81.1 - 91.4 %   Platelets 224 150 - 400 K/uL   nRBC 0.5 (H) 0.0 - 0.2 %    Comment: Performed at [facility], [address] [place], [name] [address]  Basic metabolic panel     Status: Abnormal   Collection Time: 02/11/19  4:09 AM  Result Value Ref Range   Sodium 141 135 - 145 mmol/L   Potassium 3.4 (L) 3.5 - 5.1 mmol/L   Chloride 111 98 - 111 mmol/L   CO2 25 22 - 32  mmol/L   Glucose, Bld 96 70 - 99 mg/dL   BUN 20 8 - 23 mg/dL   Creatinine, Ser 0.86 0.61 - 1.24 mg/dL   Calcium 8.1 (L) 8.9 - 10.3 mg/dL   GFR calc non Af Amer >60 >60 mL/min   GFR calc Af Amer >60 >60 mL/min   Anion gap 5 5 - 15    Comment: Performed at [facility], [address] [place], [name] [address]  CBC     Status: Abnormal   Collection Time: 02/12/19  3:48 AM  Result Value Ref Range   WBC 15.3 (H) 4.0 - 10.5 K/uL   RBC 2.76 (L) 4.22 - 5.81 MIL/uL   Hemoglobin 7.8 (L) 13.0 - 17.0 g/dL   HCT 96.2 (L) 95.2 - 84.1 %   MCV 86.2 80.0 - 100.0 fL   MCH 28.3 26.0 - 34.0 pg   MCHC 32.8 30.0 - 36.0 g/dL   RDW 32.4 (H) 40.1 - 02.7 %   Platelets 224 150 - 400 K/uL   nRBC 0.3 (H) 0.0 - 0.2 %    Comment: Performed at [facility], [address], [place], [name]  [address]   No results found.     Medical Problem List and Plan: 1.  Persistent right side weakness with decreased mobility secondary to cardioembolic bi-cerebral infarction  -admit to inpatient rehab 2.  Antithrombotics: -DVT/anticoagulation:  ELLIQUIS  -antiplatelet therapy:  3. Pain Management:  Ultracet as needed 4. Mood:  Provide emotional support  -antipsychotic agents:  none 5. Neuropsych: This patient is capable of making decisions on his own behalf. 6. Skin/Wound Care:  Routine skin checks 7. Fluids/Electrolytes/Nutrition:  Routine in and out's with follow-up chemistries 8. Upper GI bleed EGD 3. Follow-up gastrin neurology services. Continue PPI  -serial H/H's while on eliquis 9. Acute blood loss anemia. Patient transfused throughout hospital course. Follow-up CBC 10. Chronic atrial fibrillation with TAVR procedure September 2019 11. Non-STEMI. Thought secondary to thrombus status post cardiac catheterization. Continue eliquis 12. Acute on chronic diastolic congestive heart failure. Lasix 40 mg daily  -daily weights 13. Hypertension. Norvasc 5 mg daily, Lopressor 50 mg twice a day. Monitor with increased mobility 14. Hyperlipidemia. Lipitor        Mcarthur Rossetti Angiulli, PA-C 02/12/2019

## 2019-02-12 NOTE — PMR Pre-admission (Signed)
PMR Admission Coordinator Pre-Admission Assessment  Patient: Joel Rogers is an 68 y.o., male MRN: [id number] DOB: 08/20/1951 Height: _0  (185.4 cm) Weight: 117 kg              Insurance Information HMO: Yes    PPO:       PCP:       IPA:       80/20:       OTHER:   PRIMARY: U[employer] Medicare      Policy#: [id number]      Subscriber: patient CM Name: Orvan July      Phone#:      Fax#: [phone] Pre-Cert#: [id number] for 7 days with follow up to Dorthula Nettles       Employer: Retired Benefits:  Phone #: [phone]     Name: on line portal Eff. Date: 11/22/18     Deduct: $0      Out of Pocket Max: $3600 (met $30)      Life Max: N/A CIR: $295 days 1-5      SNF: $0 days 1-20; $160 days 21-43; $0 days 44-100 Outpatient: medical necessity     Co-Pay: $30/visit Home Health: 100%      Co-Pay: none DME: 80%     Co-Pay: 20% Providers: in network  Medicaid Application Date:       Case Manager:  Disability Application Date:       Case Worker:   Emergency Facilities manager Information    Name Relation Home Work Mobile   [place] Spouse [phone]  [phone]     Current Medical History  Patient Admitting Diagnosis:  Multiple infarcts, post cardiac arrest, MI  History of Present Illness:  A 68 year old right-handed male with history of diastolic congestive heart failure, aortic stenosis with TAVR procedure September 2019,atrial fibrillation maintained on Eliquis, OSA with CPAP, hypertension. Per chart review patient lives with spouse community ambulator and active prior to admission. Initially presented to [facility] 01/13/2019 secondary to right upper extremity pain and recent fall with right sided weakness findings of sepsis due to Staphylococcus bacteremia and cellulitis during admission maintain on antibiotic therapy. Noted persistent right side weakness. MRI the brain revealed multiple acute ischemic infarcts in a pattern most consistent with cardioembolic stroke.  He was transferred to [facility] for further evaluation. Venous Doppler studies negative for DVTs. Follow-up MRI the brain with numerous scattered foci of acute ischemia throughout the cerebrum and cerebellum. No midline shift or mass effect. No proximal intracranial arterial occlusion. Echocardiogram with ejection fraction of 60% and normal systolic function. TEE showed no vegetation on bioprosthetic aortic valve. Patient remained on eliquis for atrial fibrillation with the addition of aspirin for CVA prophylaxis. He developed coffee-ground emesis with acute blood loss anemia requiring transfusion totaling 6 units pack red blood cells with gastroenterology consulted for EGD completed 2 with findings of red blood in the lower third of the esophagus clotted blood in the entire stomach. Blood thinners were held and follow-up GI endoscopy again completed 01/26/2019 showing no active bleeding or blood present. Recommendations were to hold aspirin and eliquis for approximately 3-5 days and then resume. He was placed on Protonix twice daily.hospital course complicated by AKI with creatinine baseline 1.1-1.23 elevated to 1.95 renal services consulted likely secondary to ischemic ATN in the setting of sepsis maintained on gentle IV fluids creatinine improved to 1.00. Therapy evaluations completed patient was admitted for a comprehensive rehabilitation program 01/30/2019. On 02/03/2019 patient with burning chest pain felt  like acid reflux he did receive a dose of Maalox. Denied shortness of breath. Patient became unresponsive and code was called and noted to be in ventricular fibrillation. Troponin was noted to be elevated. He was discharged to acute care services 02/03/2019 maintain on intravenous heparin and followed by cardiology services. Echocardiogram was repeated with ejection fraction greater than 65% hyperdynamic systolic function.cardiac catheterization same day showed  Lat Ramus lesion 99% stenosed.  Heavy thrombus. No other significant CAD. His Eliquis was resumed and monitor for any bleeding episodes. Nasogastric tube initially in place and diet has been advanced to regular consistency. Therapies have been resumed patient tolerating nicely and was readmitted back to inpatient rehabilitation services.  Past Medical History  Past Medical History:  Diagnosis Date  . Anemia    low iron  . Arthritis    bilateral knees  . Atrial fibrillation, chronic   . Chronic diastolic CHF (congestive heart failure) ([place]) 06/15/2018  . History of subdural hematoma   . Hyperlipidemia   . Hypertension   . Morbid obesity ([place])   . Normal coronary arteries    by cardiac catheterization performed 03/14/06  . Pre-diabetes    pt denies  . S/P TAVR (transcatheter aortic valve replacement)    a. 07/25/18: Edwards Sapien 3 THV (size 26 mm, model # 9600TFX, serial # [id number]) by Dr. Cyndia Bent and Dr. Angelena Form  . Sleep apnea    on CPAP  . Venous insufficiency   . VF (ventricular fibrillation) ([place]) causing cardiac arrest 02/03/2019    Family History  family history includes Alzheimer's disease in his maternal grandfather and mother; Breast cancer in his sister; Cancer in his brother and father; Hypertension in his father.  Prior Rehab/Hospitalizations: Was on CIR 01/30/19 to 02/03/19, then to acute hospital after cardiac arrest/MI  Has the patient had major surgery during 100 days prior to admission? No  Current Medications   Current Facility-Administered Medications:  .  amLODipine (NORVASC) tablet 5 mg, 5 mg, Oral, Daily, Sherren Mocha, MD, 5 mg at 02/12/19 1003 .  apixaban (ELIQUIS) tablet 5 mg, 5 mg, Oral, BID, Camnitz, Will Hassell Done, MD, 5 mg at 02/12/19 1004 .  atorvastatin (LIPITOR) tablet 80 mg, 80 mg, Oral, q1800, Sherren Mocha, MD, 80 mg at 02/11/19 1658 .  chlorhexidine (PERIDEX) 0.12 % solution 15 mL, 15 mL, Mouth Rinse, BID, Sherren Mocha, MD, 15 mL at 02/12/19 1002 .  feeding supplement  (OSMOLITE 1.5 CAL) liquid 1,000 mL, 1,000 mL, Per Tube, Continuous, Sherren Mocha, MD, Stopped at 02/10/19 1150 .  feeding supplement (PRO-STAT SUGAR FREE 64) liquid 30 mL, 30 mL, Oral, BID, Sherren Mocha, MD, 30 mL at 02/12/19 1002 .  [START ON 02/13/2019] furosemide (LASIX) tablet 40 mg, 40 mg, Oral, Daily, Barrett, Rhonda G, PA-C .  lip balm (CARMEX) ointment, , Topical, BID, Sherren Mocha, MD .  MEDLINE mouth rinse, 15 mL, Mouth Rinse, BID, Sherren Mocha, MD, 15 mL at 02/11/19 2212 .  MEDLINE mouth rinse, 15 mL, Mouth Rinse, q12n4p, Sherren Mocha, MD, 15 mL at 02/10/19 1414 .  metoprolol tartrate (LOPRESSOR) tablet 50 mg, 50 mg, Oral, BID, Sherren Mocha, MD, 50 mg at 02/12/19 1003 .  morphine 2 MG/ML injection 2 mg, 2 mg, Intravenous, Q2H PRN, Rudean Curt, MD, 2 mg at 02/08/19 2214 .  nitroGLYCERIN (NITROSTAT) SL tablet 0.4 mg, 0.4 mg, Sublingual, Q5 Min x 3 PRN, Sherren Mocha, MD .  ondansetron [facility]) injection 4 mg, 4 mg, Intravenous, Q6H PRN, Sherren Mocha, MD, 4 mg  at 02/05/19 1909 .  pantoprazole (PROTONIX) EC tablet 40 mg, 40 mg, Oral, BID, Camnitz, Will Hassell Done, MD, 40 mg at 02/12/19 1003 .  potassium chloride SA (K-DUR,KLOR-CON) CR tablet 40 mEq, 40 mEq, Oral, Once, Barrett, Rhonda G, PA-C .  sodium chloride flush (NS) 0.9 % injection 10-40 mL, 10-40 mL, Intracatheter, PRN, Sherren Mocha, MD, 10 mL at 02/12/19 1033 .  traMADol-acetaminophen (ULTRACET) 37.5-325 MG per tablet 1-2 tablet, 1-2 tablet, Oral, Q6H PRN, Sherren Mocha, MD, 1 tablet at 02/12/19 2706  Patients Current Diet:  Diet Order            Diet - low sodium heart healthy        Diet regular Room service appropriate? Yes; Fluid consistency: Thin  Diet effective now              Precautions / Restrictions Precautions Precautions: Fall Precaution Comments: cortrak, NG tube Restrictions Weight Bearing Restrictions: No   Has the patient had 2 or more falls or a fall with injury in  the past year?No.  Suffered 1 fall PTA, no injury.  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 Walker and SPC  Prior Functional Level Prior Function Level of Independence: Independent Comments: community ambulator, drives, was actively going to [employer] and using the pool there. This was prior to Feb 2020 admission.  Self Care: Did the patient need help bathing, dressing, using the toilet or eating?  Independent  Indoor Mobility: Did the patient need assistance with walking from room to room (with or without device)? Independent  Stairs: Did the patient need assistance with internal or external stairs (with or without device)? Independent  Functional Cognition: Did the patient need help planning regular tasks such as shopping or remembering to take medications? Independent  Current Functional Level Cognition  Overall Cognitive Status: Impaired/Different from baseline Current Attention Level: Selective Orientation Level: Oriented X4 Following Commands: Follows one step commands inconsistently, Follows one step commands with increased time Safety/Judgement: Decreased awareness of safety, Decreased awareness of deficits General Comments: Frequent redirection to task, as pt focused on "I'm going to walk" then on eating lunch. Poor problem solving, but following commands and motivated to move    Extremity Assessment (includes Sensation/Coordination)  Upper Extremity Assessment: RUE deficits/detail RUE Deficits / Details: generalized weakness, and discoordination grossly 2/3 out of 5 RUE Sensation: decreased light  touch, decreased proprioception RUE Coordination: decreased fine motor, decreased gross motor LUE Deficits / Details: generalized weakness  Lower Extremity Assessment: Defer to PT evaluation RLE Deficits / Details: 2/5-3/5 grossly graded RLE Sensation: decreased proprioception RLE Coordination: decreased gross motor, decreased fine motor    ADLs  Overall ADL's : Needs assistance/impaired Eating/Feeding: NPO Grooming: Set up, Sitting, Supervision/safety Grooming Details (indicate cue type and reason): requires multiple cues for attention Upper Body Bathing: Minimal assistance, Sitting Upper Body Bathing Details (indicate cue type and reason): simulated through rubbing in lotion on arms Lower Body Bathing: Moderate assistance, Sitting/lateral leans Upper Body Dressing : Moderate assistance, Sitting Lower Body Dressing: Total assistance Toilet Transfer: Maximal assistance, +2 for physical assistance Toileting- Clothing Manipulation and Hygiene: Total assistance, +2 for physical assistance, +2  for safety/equipment, Bed level Functional mobility during ADLs: Maximal assistance, +2 for physical assistance, +2 for safety/equipment General ADL Comments: 2 person face to face transfer limited by hip pain, but able to clear bottom off of bed    Mobility  Overal bed mobility: Needs Assistance Bed Mobility: Rolling, Sidelying to Sit Rolling: Mod assist Sidelying to sit: Max assist, +2 for safety/equipment Supine to sit: Max assist Sit to supine: Max assist, +2 for physical assistance(NT called in to assist) General bed mobility comments: MaxA to assist BLEs to EOB and assist trunk elevation; pt with c/o RLE pain but able to be redirected    Transfers  Overall transfer level: Needs assistance Equipment used: 1 person hand held assist Transfer via Lift Equipment: Stedy Transfers: Sit to/from [employer] to Stand: Max assist, +2 physical assistance General transfer comment: MaxA+2 to stand with  bari stedy, heavy use of bed pad to fit hips into seat; pt with very forward flexed posture, able to correct to more upright posture with max cues when going to sit    Ambulation / Gait / Stairs / Wheelchair Mobility  Ambulation/Gait General Gait Details: unable    Posture / Balance Dynamic Sitting Balance Sitting balance - Comments: Min guard for balance Balance Overall balance assessment: Needs assistance Sitting-balance support: Feet supported, Bilateral upper extremity supported Sitting balance-Leahy Scale: Fair Sitting balance - Comments: Min guard for balance Postural control: Left lateral lean, Posterior lean Standing balance support: Bilateral upper extremity supported, During functional activity Standing balance-Leahy Scale: Zero Standing balance comment: face to face HHA    Special needs/care consideration BiPAP/CPAP No CPM No Continuous Drip IV No Dialysis No         Life Vest No Oxygen No Special Bed No Trach Size No Wound Vac (area) No      Skin Pressure injury to sacrum and buttocks                           Bowel mgmt: Incontinent of BM 02/11/19 Bladder mgmt: Incontinent, external catheter in place Diabetic mgmt No    Previous Home Environment Living Arrangements: Spouse/significant other  Lives With: Spouse Available Help at Discharge: Family, Available 24 hours/day Type of Home: House Home Layout: One level Home Access: Stairs to enter Entrance Stairs-Rails: Right Entrance Stairs-Number of Steps: 3 Bathroom Shower/Tub: Public librarian, Multimedia programmer: Handicapped height Bathroom Accessibility: Yes How Accessible: Accessible via wheelchair Home Care Services: No  Discharge Living Setting Plans for Discharge Living Setting: Patient's home, House, Lives with (comment)(Lives with wife.) Type of Home at Discharge: House Discharge Home Layout: One level Discharge Home Access: Stairs to enter Entrance Stairs-Rails: Left Entrance  Stairs-Number of Steps: 2-3 Discharge Bathroom Shower/Tub: Tub/shower unit, Walk-in shower Discharge Bathroom Toilet: Handicapped height Discharge Bathroom Accessibility: Yes How Accessible: Accessible via wheelchair, Accessible via walker Does the patient have any problems obtaining your medications?: No  Social/Family/Support Systems Patient Roles: Spouse, Parent, Other (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  She stays at home, is not working. Caregiver Availability: 24/7 Discharge Plan Discussed with Primary Caregiver: Yes Is Caregiver In Agreement with Plan?: Yes Does Caregiver/Family have Issues with Lodging/Transportation while Pt is in Rehab?: No  Goals/Additional Needs Patient/Family Goal for Rehab: PT/OT supervision to min assist  goals Expected length of stay: 12-26 days Cultural Considerations: Is a Conservator, museum/gallery Dietary Needs: Regular diet, thin liquids Equipment Needs: TBD Pt/Family Agrees to Admission and willing to participate: Yes Program Orientation Provided & Reviewed with Pt/Caregiver Including Roles  & Responsibilities: Yes  Decrease burden of Care through IP rehab admission: N/A  Possible need for SNF placement upon discharge: Not planned  Patient Condition: This patient's medical and functional status has changed since the consult dated: Original consult completed 01/25/19 in which the Rehabilitation Physician determined and documented that the patient's condition is appropriate for intensive rehabilitative care in an inpatient rehabilitation facility. See "History of Present Illness" (above) for medical update. Functional changes are: Currently requiring Max assist +2 for mobility and ADLs. Patient's medical and functional  status update has been discussed with the Rehabilitation physician and patient remains appropriate for inpatient rehabilitation. Will admit to inpatient rehab today.  Preadmission Screen Completed By:  [name], 02/12/2019 12:50 PM ______________________________________________________________________   Discussed status with Dr. Naaman Plummer on 02/12/19 at 1250 and received telephone approval for admission today.  Admission Coordinator:  [name], time 1250/Date 02/12/19

## 2019-02-12 NOTE — Progress Notes (Signed)
IP rehab admissions - I have approval from insurance case manager for acute inpatient rehab admission.  I met with patient and he is in agreement.  I called his wife at home and she is in agreement to CIR.  Bed available and will admit to CIR today.  Call me for questions.  [phone]

## 2019-02-12 NOTE — H&P (Signed)
Physical Medicine and Rehabilitation Admission H&P     Chief complaint: weakness   HPI: Joel Rogers is a 68 year old right-handed male with history of diastolic congestive heart failure, aortic stenosis with TAVR procedure September 2019,atrial fibrillation maintained on Eliquis, OSA with CPAP, hypertension. Per chart review patient lives with spouse community ambulator and active prior to admission. Initially presented to [facility] 01/13/2019 secondary to right upper extremity pain and recent fall with right sided weakness findings of sepsis due to Staphylococcus bacteremia and cellulitis during admission maintain on antibiotic therapy. Noted persistent right side weakness. MRI the brain revealed multiple acute ischemic infarcts in a pattern most consistent with cardioembolic stroke. He was [facility] for [facility] evaluation. Venous Doppler studies negative for DVTs. Follow-up MRI the brain with numerous scattered foci of acute ischemia throughout the cerebrum and cerebellum. No midline shift or mass effect. No proximal intracranial arterial occlusion. Echocardiogram with ejection fraction of 60% and normal systolic function. TEE showed no vegetation on bioprosthetic aortic valve. Patient remained on eliquis for atrial fibrillation with the addition of aspirin for CVA prophylaxis. He developed coffee-ground emesis with acute blood loss anemia requiring transfusion totaling 6 units pack red blood cells with gastroenterology consulted for EGD completed 2 with findings of red blood in the lower third of the esophagus clotted blood in the entire stomach. Blood thinners were held and follow-up GI endoscopy again completed 01/26/2019 showing no active bleeding or blood present. Recommendations were to hold aspirin and eliquis for approximately 3-5 days and then resume. He was placed on Protonix twice daily.hospital course complicated by AKI with creatinine baseline  1.1-1.23 elevated to 1.95 renal services consulted likely secondary to ischemic ATN in the setting of sepsis maintained on gentle IV fluids creatinine improved to 1.00. Therapy evaluations completed patient was admitted for a comprehensive rehabilitation program 01/30/2019. On 02/03/2019 patient with burning chest pain felt like acid reflux he did receive a dose of Maalox. Denied shortness of breath. Patient became unresponsive and code was called and noted to be in ventricular fibrillation. Troponin was noted to be elevated. He was discharged to acute care services 02/03/2019 maintain on intravenous heparin and followed by cardiology services. Echocardiogram was repeated with ejection fraction greater than 65% hyperdynamic systolic function.cardiac catheterization same day showed  Lat Ramus lesion 99% stenosed. Heavy thrombus. No other significant CAD. His Eliquis was resumed and monitor for any bleeding episodes. Nasogastric tube initially in place and diet has been advanced to regular consistency. Therapies have been resumed patient tolerating nicely and was readmitted back to inpatient rehabilitation services.   Review of Systems  Constitutional: Negative for chills and fever.  HENT: Negative for hearing loss.   Eyes: Negative for blurred vision and double vision.  Respiratory: Positive for cough and wheezing.   Cardiovascular: Positive for palpitations and leg swelling.  Gastrointestinal: Positive for blood in stool. Negative for abdominal pain, heartburn, nausea and vomiting.  Genitourinary: Negative for dysuria, flank pain and hematuria.  Musculoskeletal: Positive for myalgias.  Skin: Negative for rash.  Neurological: Positive for dizziness and weakness. Negative for seizures.       Occasional headache  Psychiatric/Behavioral: The patient has insomnia.   All other systems reviewed and are negative.       Past Medical History:  Diagnosis Date   Anemia      low iron   Arthritis       bilateral knees   Atrial fibrillation, chronic  Chronic diastolic CHF (congestive heart failure) (HCC) 06/15/2018   History of subdural hematoma     Hyperlipidemia     Hypertension     Morbid obesity (HCC)     Normal coronary arteries      by cardiac catheterization performed 03/14/06   Pre-diabetes      pt denies   S/P TAVR (transcatheter aortic valve replacement)      a. 07/25/18: Edwards Sapien 3 THV (size 26 mm, model # 9600TFX, serial # [id number]) by Dr. Laneta Simmers and Dr. Clifton James   Sleep apnea      on CPAP   Venous insufficiency           Past Surgical History:  Procedure Laterality Date   BIOPSY   01/26/2019    Procedure: BIOPSY;  Surgeon: Bernette Redbird, MD;  Location: [facility] ENDOSCOPY;  Service: Endoscopy;;   CRANIOTOMY Right 08/27/2016    Procedure: CRANIOTOMY HEMATOMA EVACUATION SUBDURAL;  Surgeon: Maeola Harman, MD;  Location: [facility] OR;  Service: Neurosurgery;  Laterality: Right;   ESOPHAGOGASTRODUODENOSCOPY N/A 01/21/2019    Procedure: ESOPHAGOGASTRODUODENOSCOPY (EGD);  Surgeon: Vida Rigger, MD;  Location: [facility] ENDOSCOPY;  Service: Endoscopy;  Laterality: N/A;   ESOPHAGOGASTRODUODENOSCOPY (EGD) WITH PROPOFOL N/A 01/22/2019    Procedure: ESOPHAGOGASTRODUODENOSCOPY (EGD) WITH PROPOFOL;  Surgeon: Bernette Redbird, MD;  Location: [facility] ENDOSCOPY;  Service: Endoscopy;  Laterality: N/A;   ESOPHAGOGASTRODUODENOSCOPY (EGD) WITH PROPOFOL N/A 01/26/2019    Procedure: ESOPHAGOGASTRODUODENOSCOPY (EGD) WITH PROPOFOL;  Surgeon: Bernette Redbird, MD;  Location: [facility] ENDOSCOPY;  Service: Endoscopy;  Laterality: N/A;   GASTRIC BYPASS   09/2008   JOINT REPLACEMENT   08/31/2006    right hip   LEFT HEART CATH AND CORONARY ANGIOGRAPHY N/A 02/05/2019    Procedure: LEFT HEART CATH AND CORONARY ANGIOGRAPHY;  Surgeon: Corky Crafts, MD;  Location: [facility] INVASIVE CV LAB;  Service: Cardiovascular;  Laterality: N/A;   MULTIPLE EXTRACTIONS WITH ALVEOLOPLASTY N/A 07/13/2018    Procedure: Extraction of  tooth #'s 3,8,10, 23-26, 30and 32 with alveoloplasty and gross debridement of remaining teeth;  Surgeon: Charlynne Pander, DDS;  Location: [facility] OR;  Service: Oral Surgery;  Laterality: N/A;   RIGHT HEART CATH N/A 07/06/2018    Procedure: RIGHT HEART CATH;  Surgeon: Kathleene Hazel, MD;  Location: [facility] INVASIVE CV LAB;  Service: Cardiovascular;  Laterality: N/A;   RIGHT/LEFT HEART CATH AND CORONARY ANGIOGRAPHY N/A 07/10/2018    Procedure: RIGHT/LEFT HEART CATH AND CORONARY ANGIOGRAPHY;  Surgeon: Dolores Patty, MD;  Location: [facility] INVASIVE CV LAB;  Service: Cardiovascular;  Laterality: N/A;   TEE WITHOUT CARDIOVERSION N/A 07/25/2018    Procedure: TRANSESOPHAGEAL ECHOCARDIOGRAM (TEE);  Surgeon: Kathleene Hazel, MD;  Location: [facility] OR;  Service: Open Heart Surgery;  Laterality: N/A;   TEE WITHOUT CARDIOVERSION N/A 01/18/2019    Procedure: TRANSESOPHAGEAL ECHOCARDIOGRAM (TEE) WITH PROPOFOL;  Surgeon: Jonelle Sidle, MD;  Location: [facility] ORS;  Service: Cardiovascular;  Laterality: N/A;   TOTAL HIP ARTHROPLASTY        right hip   TRANSCATHETER AORTIC VALVE REPLACEMENT, TRANSFEMORAL   07/25/2018   TRANSCATHETER AORTIC VALVE REPLACEMENT, TRANSFEMORAL N/A 07/25/2018    Procedure: TRANSCATHETER AORTIC VALVE REPLACEMENT, TRANSFEMORAL;  Surgeon: Kathleene Hazel, MD;  Location: [facility] OR;  Service: Open Heart Surgery;  Laterality: N/A;         Family History  Problem Relation Age of Onset   Hypertension Father     Cancer Father          prob prostate   Alzheimer's  disease Mother     Alzheimer's disease Maternal Grandfather     Breast cancer Sister     Cancer Brother          "brain tumor"    Social History:  reports that he has quit smoking. He has never used smokeless tobacco. He reports that he does not drink alcohol or use drugs. Allergies:       Allergies  Allergen Reactions   Kayexalate [Polystyrene] Other (See Comments)      Recommended to avoid by GI due to  ischemic gastropathy          Medications Prior to Admission  Medication Sig Dispense Refill   acetaminophen (TYLENOL) 650 MG CR tablet Take 650 mg by mouth 2 (two) times daily as needed for pain.       aspirin 81 MG chewable tablet Chew 1 tablet (81 mg total) by mouth daily.       atorvastatin (LIPITOR) 80 MG tablet TAKE 1 TABLET BY MOUTH ONCE DAILY WITH  BREAKFAST (Patient taking differently: Take 80 mg by mouth daily. ) 90 tablet 1   diltiazem (CARDIZEM CD) 120 MG 24 hr capsule Take 1 capsule (120 mg total) by mouth daily. 90 capsule 1   ergocalciferol (VITAMIN D2) 1.25 MG (50000 UT) capsule Take 50,000 Units by mouth once a week.       spironolactone (ALDACTONE) 25 MG tablet Take 0.5 tablets (12.5 mg total) by mouth daily. 30 tablet 5   torsemide (DEMADEX) 20 MG tablet Take 40 mg by mouth as needed. Use as needed if weight is over 245 pounds.       amoxicillin (AMOXIL) 500 MG capsule Take 2,000 mg (4 tablets) one hour prior to dental visits. (Patient not taking: Reported on 02/03/2019) 8 capsule 3   apixaban (ELIQUIS) 5 MG TABS tablet Take 1 tablet (5 mg total) by mouth 2 (two) times daily. (Patient not taking: Reported on 02/03/2019) 180 tablet 1   colchicine 0.6 MG tablet TAKE 1 TABLET BY MOUTH TWICE DAILY (Patient taking differently: Take 0.6 mg by mouth as needed (Gout flare up.). ) 60 tablet 2      Drug Regimen Review Drug regimen was reviewed and remains appropriate with no significant issues identified   Home: Home Living Family/patient expects to be discharged to:: Private residence Living Arrangements: Spouse/significant other Available Help at Discharge: Family, Available 24 hours/day Type of Home: House Home Access: Stairs to enter [employer] of Steps: 3 Entrance Stairs-Rails: Right Home Layout: One level Bathroom Shower/Tub: Tub/shower unit, Health visitor: Handicapped height Bathroom Accessibility: Yes Home Equipment: Environmental consultant - 2  wheels, Cane - single point  Lives With: Spouse   Functional History: Prior Function Level of Independence: Independent Comments: community ambulator, drives, was actively going to [employer] and using the pool there. This was prior to Feb 2020 admission.   Functional Status:  Mobility: Bed Mobility Overal bed mobility: Needs Assistance Bed Mobility: Rolling, Sidelying to Sit Rolling: Mod assist Sidelying to sit: Max assist, +2 for safety/equipment Supine to sit: Max assist Sit to supine: Max assist, +2 for physical assistance(NT called in to assist) General bed mobility comments: MaxA to assist BLEs to EOB and assist trunk elevation; pt with c/o RLE pain but able to be redirected Transfers Overall transfer level: Needs assistance Equipment used: 1 person hand held assist Transfer via Lift Equipment: Stedy Transfers: Sit to/from Stand Sit to Stand: Max assist, +2 physical assistance General transfer comment: MaxA+2 to stand with  bari stedy, heavy use of bed pad to fit hips into seat; pt with very forward flexed posture, able to correct to more upright posture with max cues when going to sit Ambulation/Gait General Gait Details: unable   ADL: ADL Overall ADL's : Needs assistance/impaired Eating/Feeding: NPO Grooming: Set up, Sitting, Supervision/safety Grooming Details (indicate cue type and reason): requires multiple cues for attention Upper Body Bathing: Minimal assistance, Sitting Upper Body Bathing Details (indicate cue type and reason): simulated through rubbing in lotion on arms Lower Body Bathing: Moderate assistance, Sitting/lateral leans Upper Body Dressing : Moderate assistance, Sitting Lower Body Dressing: Total assistance Toilet Transfer: Maximal assistance, +2 for physical assistance Toileting- Clothing Manipulation and Hygiene: Total assistance, +2 for physical assistance, +2 for safety/equipment, Bed level Functional mobility during ADLs: Maximal assistance, +2  for physical assistance, +2 for safety/equipment General ADL Comments: 2 person face to face transfer limited by hip pain, but able to clear bottom off of bed   Cognition: Cognition Overall Cognitive Status: Impaired/Different from baseline Orientation Level: Oriented X4 Cognition Arousal/Alertness: Awake/alert Behavior During Therapy: WFL for tasks assessed/performed Overall Cognitive Status: Impaired/Different from baseline Area of Impairment: Attention, Memory, Following commands, Safety/judgement, Awareness, Problem solving Current Attention Level: Selective Memory: Decreased recall of precautions, Decreased short-term memory Following Commands: Follows one step commands inconsistently, Follows one step commands with increased time Safety/Judgement: Decreased awareness of safety, Decreased awareness of deficits Awareness: Intellectual Problem Solving: Slow processing, Difficulty sequencing, Decreased initiation, Requires verbal cues, Requires tactile cues General Comments: Frequent redirection to task, as pt focused on "I'm going to walk" then on eating lunch. Poor problem solving, but following commands and motivated to move   Physical Exam: Blood pressure 127/82, pulse 78, temperature 98.3 F (36.8 C), temperature source Oral, resp. rate 16, height 6\' 1"  (1.854 m), weight 117 kg, SpO2 100 %. Physical Exam  Constitutional: He is oriented to person, place, and time. No distress.  HENT:  Head: Normocephalic and atraumatic.  Eyes: Pupils are equal, round, and reactive to light. EOM are normal.  Neck: Normal range of motion.  Cardiovascular: Normal rate and regular rhythm.  Murmur heard. Respiratory: Effort normal and breath sounds normal. No respiratory distress. He has no wheezes.  GI: Soft. He exhibits no distension. There is no abdominal tenderness.  Musculoskeletal:     Comments: RUE 1+ edema. Chronic pitting edema bilateral LE, RIght more than left.   Neurological: He is  alert and oriented to person, place, and time.  Reasonable insight and awareness. Normal CN exam. RUE 3+ to 4-/5 prox to distal. LUE 4/5. RLE 1-2/5. LLE 1+ to 2/5 prox to distal. Senses light touch and pain in all 4's, decreased in distal LE's.      Skin: Skin is warm. He is not diaphoretic.  Psychiatric: He has a normal mood and affect. His behavior is normal. Judgment and thought content normal.      Lab Results Last 48 Hours        Results for orders placed or performed during the hospital encounter of 02/03/19 (from the past 48 hour(s))  CBC     Status: Abnormal    Collection Time: 02/11/19  4:09 AM  Result Value Ref Range    WBC 15.4 (H) 4.0 - 10.5 K/uL    RBC 2.92 (L) 4.22 - 5.81 MIL/uL    Hemoglobin 7.9 (L) 13.0 - 17.0 g/dL    HCT 16.125.2 (L) 09.639.0 - 52.0 %    MCV 86.3 80.0 - 100.0 fL  MCH 27.1 26.0 - 34.0 pg    MCHC 31.3 30.0 - 36.0 g/dL    RDW 16.1 (H) 09.6 - 15.5 %    Platelets 224 150 - 400 K/uL    nRBC 0.5 (H) 0.0 - 0.2 %      Comment: Performed at [facility], [address] [place], [name] [address]  Basic metabolic panel     Status: Abnormal    Collection Time: 02/11/19  4:09 AM  Result Value Ref Range    Sodium 141 135 - 145 mmol/L    Potassium 3.4 (L) 3.5 - 5.1 mmol/L    Chloride 111 98 - 111 mmol/L    CO2 25 22 - 32 mmol/L    Glucose, Bld 96 70 - 99 mg/dL    BUN 20 8 - 23 mg/dL    Creatinine, Ser 9.81 0.61 - 1.24 mg/dL    Calcium 8.1 (L) 8.9 - 10.3 mg/dL    GFR calc non Af Amer >60 >60 mL/min    GFR calc Af Amer >60 >60 mL/min    Anion gap 5 5 - 15      Comment: Performed at [facility], [address] [place], [name] [address]  CBC     Status: Abnormal    Collection Time: 02/12/19  3:48 AM  Result Value Ref Range    WBC 15.3 (H) 4.0 - 10.5 K/uL    RBC 2.76 (L) 4.22 - 5.81 MIL/uL    Hemoglobin 7.8 (L) 13.0 - 17.0 g/dL    HCT 82.9 (L) 56.2 - 52.0 %    MCV 86.2 80.0 - 100.0 fL    MCH 28.3 26.0 - 34.0 pg    MCHC 32.8 30.0 - 36.0 g/dL     RDW 13.0 (H) 86.5 - 15.5 %    Platelets 224 150 - 400 K/uL    nRBC 0.3 (H) 0.0 - 0.2 %      Comment: Performed at [facility], [address], [place], [name] [address]      Imaging Results (Last 48 hours)  No results found.           Medical Problem List and Plan: 1.  Persistent right side weakness with decreased mobility secondary to cardioembolic bi-cerebral infarction             -admit to inpatient rehab 2.  Antithrombotics: -DVT/anticoagulation:  ELLIQUIS             -antiplatelet therapy:  3. Pain Management:  Ultracet as needed 4. Mood:  Provide emotional support             -antipsychotic agents:  none 5. Neuropsych: This patient is capable of making decisions on his own behalf. 6. Skin/Wound Care:  Routine skin checks 7. Fluids/Electrolytes/Nutrition:  Routine in and out's with follow-up chemistries 8. Upper GI bleed EGD 3. Follow-up gastrin neurology services. Continue PPI             -serial H/H's while on eliquis 9. Acute blood loss anemia. Patient transfused throughout hospital course. Follow-up CBC 10. Chronic atrial fibrillation with TAVR procedure September 2019 11. Non-STEMI. Thought secondary to thrombus status post cardiac catheterization. Continue eliquis 12. Acute on chronic diastolic congestive heart failure. Lasix 40 mg daily             -daily weights 13. Hypertension. Norvasc 5 mg daily, Lopressor 50 mg twice a day. Monitor with increased mobility 14. Hyperlipidemia. Lipitor  Post Admission Physician Evaluation: 1. Functional deficits secondary  to cardio-embolic bi-cerebral infarctions complicated by cardiac arrest.. 2. Patient is admitted to receive collaborative, interdisciplinary care between the physiatrist, rehab nursing staff, and therapy team. 3. Patient's level of medical complexity and substantial therapy needs in context of that medical necessity cannot be provided at a lesser intensity of care such as a SNF. 4. Patient has  experienced substantial functional loss from his/her baseline which was documented above under the "Functional History" and "Functional Status" headings.  Judging by the patient's diagnosis, physical exam, and functional history, the patient has potential for functional progress which will result in measurable gains while on inpatient rehab.  These gains will be of substantial and practical use upon discharge  in facilitating mobility and self-care at the household level. 5. Physiatrist will provide 24 hour management of medical needs as well as oversight of the therapy plan/treatment and provide guidance as appropriate regarding the interaction of the two. 6. The Preadmission Screening has been reviewed and patient status is unchanged unless otherwise stated above. 7. 24 hour rehab nursing will assist with bladder management, bowel management, safety, skin/wound care, disease management, medication administration, pain management and patient education  and help integrate therapy concepts, techniques,education, etc. 8. PT will assess and treat for/with: Lower extremity strength, range of motion, stamina, balance, functional mobility, safety, adaptive techniques and equipment, NMR, family ed.   Goals are: supervision to min assist. 9. OT will assess and treat for/with: ADL's, functional mobility, safety, upper extremity strength, adaptive techniques and equipment, NMR, family ed.   Goals are: supervision to min assist. Therapy may proceed with showering this patient. 10. SLP will assess and treat for/with: n/a.  Goals are: n/a. 11. Case Management and Social Worker will assess and treat for psychological issues and discharge planning. 12. Team conference will be held weekly to assess progress toward goals and to determine barriers to discharge. 13. Patient will receive at least 3 hours of therapy per day at least 5 days per week. 14. ELOS: 12-26 days       15. Prognosis:  excellent   I have personally  performed a face to face diagnostic evaluation of this patient and formulated the key components of the plan.  Additionally, I have personally reviewed laboratory data, imaging studies, as well as relevant notes and concur with the physician assistant's documentation above.  Ranelle Oyster, MD, FAAPMR         Mcarthur Rossetti Angiulli, PA-C 02/12/2019

## 2019-02-12 NOTE — Progress Notes (Signed)
  Speech Language Pathology Treatment: Dysphagia  Patient Details Name: Joel Rogers MRN: [id number] DOB: 07/10/1951 Today's Date: 02/12/2019 Time: 8590-9311 SLP Time Calculation (min) (ACUTE ONLY): 9 min  Assessment / Plan / Recommendation Clinical Impression  Pt shows no overt signs of aspiration. Although he has mildly prolonged mastication, suspect this is near baseline as he says he does not normally wear his dentures at home, and he compensates with Mod I. Would continue regular solids and thin liquids. SLP to sign off acutely, but recommend additional SLP for cognition at next level of care.   HPI HPI: Pt is a 68 year old man who sustained a VF cardiac arrest on CIR 02/04/19. CPR with defibrillation x3 prior to ROSC. He was previously admitted 01/13/19 due to RUE/LE pain with sepsis and cellulitis. During that admission, MRI revealed multi acute ischemic infarcts. PMH consists of a fib, CHF, h/o subdural hematoma, HTN, and TAVR placement 07/2018. He was on CIR 01/30/19 to 02/04/19. Until the cardiac arrest on 3/15 patient was tolerating a soft diet with thin liquids (endoscopy on 3/3 which revealed blood and blood clots in esophagus and stomach). On 3/4 he was only tolerating sips and chips due to the feeling of fullness, possibly related to finding on endoscopy. Patient now has a cortrak present to meet nutritional needs and is NPO.       SLP Plan  All goals met       Recommendations  Diet recommendations: Regular;Thin liquid Liquids provided via: Cup;Straw Medication Administration: Whole meds with liquid Supervision: Patient able to self feed;Intermittent supervision to cue for compensatory strategies Compensations: Minimize environmental distractions;Slow rate;Small sips/bites Postural Changes and/or Swallow Maneuvers: Seated upright 90 degrees                Oral Care Recommendations: Oral care BID Follow up Recommendations: Inpatient Rehab(for cognitive goals) SLP Visit  Diagnosis: Dysphagia, unspecified (R13.10) Plan: All goals met       GO                Venita Sheffield Thierno Hun 02/12/2019, 12:21 PM  Pollyann Glen, M.A. [place] Acute Environmental education officer [phone] Office [phone]

## 2019-02-12 NOTE — Progress Notes (Signed)
Pt n/a for ambulating with CR. Discussed MI and CRPII with pt. Good reception, very motivated to get stronger. Will refer pt to [facility] CRPII for when he has recovered more. [id number] Ethelda Chick CES, ACSM 10:40 AM 02/12/2019

## 2019-02-12 NOTE — Progress Notes (Addendum)
Progress Note  Patient Name: Joel Rogers Date of Encounter: 02/12/2019  Primary Cardiologist: Nanetta Batty, MD   Subjective   No acute complaints, no CP or SOB, however, moves self poorly.  Inpatient Medications    Scheduled Meds: . amLODipine  5 mg Oral Daily  . apixaban  5 mg Oral BID  . atorvastatin  80 mg Oral q1800  . chlorhexidine  15 mL Mouth Rinse BID  . feeding supplement (PRO-STAT SUGAR FREE 64)  30 mL Oral BID  . furosemide  40 mg Oral Daily  . lip balm   Topical BID  . mouth rinse  15 mL Mouth Rinse BID  . mouth rinse  15 mL Mouth Rinse q12n4p  . metoprolol tartrate  50 mg Oral BID  . pantoprazole  40 mg Oral BID   Continuous Infusions: . feeding supplement (OSMOLITE 1.5 CAL) Stopped (02/10/19 1150)   PRN Meds: morphine injection, nitroGLYCERIN, ondansetron (ZOFRAN) IV, sodium chloride flush, traMADol-acetaminophen   Vital Signs    Vitals:   02/11/19 0851 02/11/19 1200 02/11/19 1952 02/12/19 0516  BP: 118/69 120/83 115/68 135/77  Pulse: 99 62 72 69  Resp: 18 20 18 16   Temp:  98.4 F (36.9 C) 98.4 F (36.9 C) 98.3 F (36.8 C)  TempSrc:  Oral Oral Oral  SpO2: 99% 99% 99% 98%  Weight:    117 kg  Height:        Intake/Output Summary (Last 24 hours) at 02/12/2019 7494 Last data filed at 02/12/2019 0400 Gross per 24 hour  Intake 720 ml  Output 2476 ml  Net -1756 ml   Last 3 Weights 02/12/2019 02/10/2019 02/10/2019  Weight (lbs) 258 lb 253 lb 6.4 oz 254 lb 6.6 oz  Weight (kg) 117.028 kg 114.941 kg 115.4 kg      Telemetry    Afib, rate controlled  - Personally Reviewed  ECG    None new- Personally Reviewed  Physical Exam   General: Well developed, well nourished, male in no acute distress Head: Eyes PERRLA, No xanthomas.   Normocephalic and atraumatic Lungs: decreased BS bases bilaterally to auscultation. Heart: Irreg S1 S2, without RG. 2/6 SEM. Upper extremity pulses are 2+ & equal.  JVD to jaw Abdomen: Bowel sounds are present,  abdomen soft and non-tender without masses or  hernias noted. Msk: weak strength and tone for age. Extremities: No clubbing, cyanosis, R>L  LE edema, chronic on R, RUE forearm edema noted   Skin:  No rashes or lesions noted. Chronic stasis changes RLE Neuro: Alert and oriented X 3. Psych:  Good affect, responds appropriately  Labs    Chemistry Recent Labs  Lab 02/07/19 0425 02/08/19 0400 02/10/19 0340 02/11/19 0409  NA 141 142 144 141  K 4.4 3.6 3.5 3.4*  CL 110 113* 114* 111  CO2 24 24 26 25   GLUCOSE 110* 133* 118* 96  BUN 34* 31* 20 20  CREATININE 1.12 0.97 0.83 0.80  CALCIUM 8.7* 8.3* 8.2* 8.1*  PROT 6.5  --   --   --   ALBUMIN 2.2*  --   --   --   AST 39  --   --   --   ALT 21  --   --   --   ALKPHOS 93  --   --   --   BILITOT 1.3*  --   --   --   GFRNONAA >60 >60 >60 >60  GFRAA >60 >60 >60 >60  ANIONGAP 7 5  4* 5     Hematology Recent Labs  Lab 02/10/19 0340 02/11/19 0409 02/12/19 0348  WBC 13.7* 15.4* 15.3*  RBC 2.94* 2.92* 2.76*  HGB 7.9* 7.9* 7.8*  HCT 25.3* 25.2* 23.8*  MCV 86.1 86.3 86.2  MCH 26.9 27.1 28.3  MCHC 31.2 31.3 32.8  RDW 20.4* 20.2* 20.1*  PLT 206 224 224    Cardiac Enzymes No results for input(s): TROPONINI in the last 168 hours. No results for input(s): TROPIPOC in the last 168 hours.   BNP No results for input(s): BNP, PROBNP in the last 168 hours.   DDimer No results for input(s): DDIMER in the last 168 hours.   Radiology    No results found.  Cardiac Studies   CARDIAC CATH: 02/05/2019  Lat Ramus lesion is 99% stenosed. Heavy thrombus.  No other significant CAD.   D/w Dr. Excell Seltzer.  Restart IV heparin tonight and if Hbg stable, will restart Eliquis tomorrow.   Medical therapy for ramus disease.    TTE: 02/05/2019 . The left ventricle has hyperdynamic systolic function, with an ejection fraction of >65%. The cavity size was normal. There is moderate concentric left ventricular hypertrophy. Left ventricular diastolic  function could not be evaluated secondary to  atrial fibrillation. No evidence of left ventricular regional wall motion abnormalities.  2. The right ventricle has mildly reduced systolic function. The cavity was normal. There is no increase in right ventricular wall thickness. Right ventricular systolic pressure is severely elevated with an estimated pressure of 63.3 mmHg.  3. Left atrial size was severely dilated.  4. Right atrial size was severely dilated.  5. Small pericardial effusion.  6. The pericardial effusion is posterior to the left ventricle and the left atrium.  7. There is mild mitral annular calcification present.  8. Tricuspid valve regurgitation is mild-moderate.  9. A 26 an Edwards Edwards Sapien bioprosthetic aortic valve (TAVR) valve is present in the aortic position. Normal aortic valve prosthesis. 10. The pulmonic valve was grossly normal. Pulmonic valve regurgitation is mild to moderate is mild by color flow Doppler. 11. The inferior vena cava was dilated in size with <50% respiratory variability. 12. Possible small PFO. 13. There is right bowing of the interatrial septum, suggestive of elevated left atrial pressure.  Cath: 02/05/2019   Lat Ramus lesion is 99% stenosed. Heavy thrombus.  No other significant CAD.   D/w Dr. Excell Seltzer.  Restart IV heparin tonight and if Hbg stable, will restart Eliquis tomorrow.   Medical therapy for ramus disease.    Patient Profile     68 y.o. male with history of severe aortic stenosis and TAVR approximately 6 months ago. Was in CIR 2nd deconditioning after admit for cardioembolic CVA, fall and sepsis. UGIB>>Eliquis and ASA held. S/p VF arrest 03/14and large non-STEMI, troponin greater than 65 and V. fib cardiac arrest.   Assessment & Plan    1.  Stroke/altered mental status:  - per Neuro, no new CVA - back on Eliquis, tolerating well. - passed swallow eval  2.  Non-STEMI:  - See cath results above, med rx for 99% RI w/  thrombus - on high-dose statin, BB, Eliquis - EF nl  3.  Gastrointestinal bleed:  - GI has seen, EGD done  - ischemic gastritis seen, may have been caused by Kayexalate - on Protonix - H&H low but stable  4.  Permanent atrial fibrillation:  - rate is controlled - on BB and Eliquis  5.  Acute diastolic heart failure:  -  changed to po Lasix 40 mg qd on 03/20 - still has some volume overload, will give a dose of IV Lasix today.  6.  Disposition:  - PT recommends he go back to CIR - will discuss w/ staff, ok to go today   For questions or updates, please contact [facility] Please consult [email or web address] for contact info under   Signed, Theodore Demark, PA-C  02/12/2019, 8:08 AM     Patient examined chart reviewed Cardiac status stable SEM through TAVR valve no AR. Appropriate to Go back to inpatient rehab. Transfer to Hospitalist service if this is going to be a long process  Charlton Haws

## 2019-02-12 NOTE — Discharge Summary (Signed)
Discharge Summary    Patient ID: Joel Rogers,  MRN: [id number], DOB/AGE: 05/18/1951 68 y.o.  Admit date: 02/03/2019 Discharge date: 02/12/2019  Primary Care Provider: Milinda Antis F Primary Cardiologist: Nanetta Batty, MD   Discharge Diagnoses    Principal Problem:   VF (ventricular fibrillation) [facility]) causing cardiac arrest Active Problems:   Non-ST elevation (NSTEMI) myocardial infarction [facility])   Acute on chronic diastolic heart failure (HCC)   Atrial fibrillation, chronic   VT (ventricular tachycardia) (HCC)   Pressure injury of skin   Malnutrition of moderate degree   Cerebral embolism with cerebral infarction   Allergies Allergies  Allergen Reactions   Kayexalate [Polystyrene] Other (See Comments)    Recommended to avoid by GI due to ischemic gastropathy    Diagnostic Studies/Procedures    CARDIAC CATH: 02/05/2019  Lat Ramus lesion is 99% stenosed. Heavy thrombus.  No other significant CAD.  D/w Dr. Excell Seltzer. Restart IV heparin tonight and if Hbg stable, will restart Eliquis tomorrow.   Medical therapy for ramus disease.    TTE: 02/05/2019 . The left ventricle has hyperdynamic systolic function, with an ejection fraction of >65%. The cavity size was normal. There is moderate concentric left ventricular hypertrophy. Left ventricular diastolic function could not be evaluated secondary to  atrial fibrillation. No evidence of left ventricular regional wall motion abnormalities. 2. The right ventricle has mildly reduced systolic function. The cavity was normal. There is no increase in right ventricular wall thickness. Right ventricular systolic pressure is severely elevated with an estimated pressure of 63.3 mmHg. 3. Left atrial size was severely dilated. 4. Right atrial size was severely dilated. 5. Small pericardial effusion. 6. The pericardial effusion is posterior to the left ventricle and the left atrium. 7. There is mild mitral annular  calcification present. 8. Tricuspid valve regurgitation is mild-moderate. 9. A 26 an Edwards Edwards Sapien bioprosthetic aortic valve (TAVR) valve is present in the aortic position. Normal aortic valve prosthesis. 10. The pulmonic valve was grossly normal. Pulmonic valve regurgitation is mild to moderate is mild by color flow Doppler. 11. The inferior vena cava was dilated in size with <50% respiratory variability. 12. Possible small PFO. 13. There is right bowing of the interatrial septum, suggestive of elevated left atrial pressure. _____________   History of Present Illness     Joel Rogers is a 68 y.o. male with history of severe aortic stenosis and TAVR 07/2018. Was admitted to CIR 2nd deconditioning after admit 02/22-03/08/2019 for cardioembolic CVA, fall and sepsis. UGIB>>Eliquis and ASA held. S/p VF arrest 03/14and large non-STEMI, troponin greater than 65, admitted to acute care.   Hospital Course     Consultants: CCM, Neurology  1.  VF arrest: He was initially admitted by the critical care medicine staff.  He had complained of retrosternal chest burning and no relief with Maalox just prior to the VF arrest.  After he arrested, he was defibrillated x3.  He was initially unable to be intubated and then had return of spontaneous respirations so did not require it.  After defibrillation, he was in atrial fibrillation, normal for him.  He was on lidocaine for period of time and a beta-blocker.  He is to continue on the beta-blocker.  Gradually, the ventricular ectopy improved and resolved.  2.  Non-STEMI: His peak troponin was greater than 65.  He was given ASA 324 mg and started on heparin.  He was on a beta-blocker and high-dose statin as well.  An echocardiogram showed a  normal EF, results are above. He did not require nitrates for chest pain as his only chest pain was related to the CPR.  He was taken to the Cath Lab on 02/05/2019, results above.  His only significant lesion was in the  ramus intermedius with a significant thrombus burden.  He was continued on heparin until 3/20 (and then started on Eliquis for A. fib).  Because of his recent GI bleed and the need for anticoagulation, he was not continued on aspirin 81 mg.  3.  Acute on chronic diastolic CHF: He was given IV Lasix 40 mg daily as needed for volume overload.  He was then put on Lasix 40 mg daily.  He will continue Lasix 40 mg daily after discharge.  At discharge, his weight is 258 pounds by bed scale.  He will need daily weights and ongoing attention paid to his volume.  If his weight trends up, he may need an occasional dose of IV Lasix while in rehab.  His BMET will need to be followed as he required potassium supplementation at times.  4.  Chronic atrial fibrillation: He was on metoprolol 50 mg p.o. twice daily, he was not missing doses.  His heart rate was generally well controlled and he was asymptomatic.  5.  GI issues: While he was in CR, he had a GI bleed that was from ischemic gastritis.  In the biopsies, there was some Kayexalate crystals and the gastritis was felt secondary to the Kayexalate, which is now listed as an allergy.  He was on a Protonix drip until 3/20 and then was changed to Protonix 40 mg p.o. twice daily.  He should continue on Protonix 40 mg twice daily for now.  Dr. Levora AngelBrahmbhatt did not leave recommendations for changing to daily Protonix, will leave to the Rehab MDs.  6.  Acute blood loss anemia: His hemoglobin dropped to 7.2 and he was transfused on 3/15.  His hemoglobin and hematocrit improved to a peak of 8.5/26.1.  His blood counts were followed closely.  After the transfusion, his hemoglobin was trending down but very slowly.  This was felt secondary to IVs and blood draws.  7.Hypokalemia: He had some hypokalemia related to Lasix administration.  He was supplemented, with the last dose of 40 mEq given on 3/23.  This will need to be followed.  8.  Pressure ulcers and malnutrition: He was  not considered safe to take oral meds or supplements until 3/21.  Until that time, he was on tube feeds, followed by a registered dietitian.  He passed a swallowing eval and was started back on p.o. meds as well as p.o. intake.  He is tolerating this well.  The nursing staff worked with him to avoid skin breakdown.  9.   Cerebral embolism with cerebral infarction: Neurology consult was initially called 3/17 because of confusion and mental status changes.  He was evaluated, but Dr. Pearlean BrownieSethi did not feel there was a new stroke.  He felt the changes were most likely from hypoxic ischemic encephalopathy related to the cardiac arrest.  There was a small left frontal subarachnoid hemorrhage likely related to periprocedural CVA following cardiac cath.  On the last CT, there were no new infarcts and [facility]  Once he passed a swallow eval, the heparin was changed to Eliquis.  He was seen by PT and OT and going back to CIR for inpatient rehab was recommended.  A rehab consult was called and he was accepted.  The patient and his wife are in agreement with this as a plan.  He will be transferred to inpatient rehab to continue therapies on 02/12/2019.  _____________  Discharge Vitals Blood pressure 127/82, pulse 78, temperature 98.3 F (36.8 C), temperature source Oral, resp. rate 16, height  (1.854 m), weight 117 kg, SpO2 100 %.  Filed Weights   02/10/19 0518 02/10/19 2300 02/12/19 0516  Weight: 115.4 kg 114.9 kg 117 kg    Labs & Radiologic Studies    CBC Recent Labs    02/11/19 0409 02/12/19 0348  WBC 15.4* 15.3*  HGB 7.9* 7.8*  HCT 25.2* 23.8*  MCV 86.3 86.2  PLT 224 224   Basic Metabolic Panel Recent Labs    16/10/96 0340 02/11/19 0409  NA 144 141  K 3.5 3.4*  CL 114* 111  CO2 26 25  GLUCOSE 118* 96  BUN 20 20  CREATININE 0.83 0.80  CALCIUM 8.2* 8.1*   Liver Function Tests Lab Results  Component Value Date   ALT 21 02/07/2019   AST 39 02/07/2019     ALKPHOS 93 02/07/2019   BILITOT 1.3 (H) 02/07/2019   Cardiac Enzymes Troponin I  Date Value Ref Range Status  02/03/2019 >65.00 (HH) <0.03 ng/mL Final    Comment:    CRITICAL VALUE NOTED.  VALUE IS CONSISTENT WITH PREVIOUSLY REPORTED AND CALLED VALUE. Performed at [facility], [address] [place], [name] [address]   02/03/2019 12.24 (HH) <0.03 ng/mL Final    Comment:    CRITICAL RESULT CALLED TO, READ BACK BY AND VERIFIED WITH: Sibyl Parr RN AT 1527 02/03/2019 BY [facility] Performed at [facility], [address], [place], [name] [address]   02/03/2019 0.20 (HH) <0.03 ng/mL Final    Comment:    CRITICAL RESULT CALLED TO, READ BACK BY AND VERIFIED WITH: J CORREA,RN AT 1011 02/03/2019 BY L BENFIELD Performed at [facility], [address], [place], [name] [address]    BNP    Component Value Date/Time   BNP 408.1 (H) 02/03/2019 0834   Fasting Lipid Panel Cholesterol  Date Value Ref Range Status  02/04/2019 104 0 - 200 mg/dL Final   HDL  Date Value Ref Range Status  02/04/2019 35 (L) >40 mg/dL Final   LDL Cholesterol (Calc)  Date Value Ref Range Status  05/01/2018 70 mg/dL (calc) Final    Comment:    Reference range: <100 . Desirable range <100 mg/dL for primary prevention;   <70 mg/dL for patients with CHD or diabetic patients  with > or = 2 CHD risk factors. Marland Kitchen LDL-C is now calculated using the Martin-Hopkins  calculation, which is a validated novel method providing  better accuracy than the Friedewald equation in the  estimation of LDL-C.  Horald Pollen et al. Lenox Ahr. 9562;130(86): 2061-2068  (http://education.[employer].com/faq/FAQ164)    LDL Cholesterol  Date Value Ref Range Status  02/04/2019 58 0 - 99 mg/dL Final    Comment:           Total Cholesterol/HDL:CHD Risk Coronary Heart Disease Risk Table                     Men   Women  1/2 Average Risk   3.4   3.3  Average Risk       5.0   4.4  2 X Average Risk   9.6   7.1  3  X Average Risk  23.4   11.0  Use the calculated Patient Ratio above and the CHD Risk Table to determine the patient's CHD Risk.        ATP III CLASSIFICATION (LDL):  <100     mg/dL   Optimal  161-096  mg/dL   Near or Above                    Optimal  130-159  mg/dL   Borderline  045-409  mg/dL   High  >811     mg/dL   Very High Performed at [facility], [address] [place], [name] [address]    Triglycerides  Date Value Ref Range Status  02/04/2019 53 <150 mg/dL Final   _____________  Ct Head Wo Contrast  Result Date: 02/06/2019 CLINICAL DATA:  Not following commands appropriately. Altered level of consciousness. EXAM: CT HEAD WITHOUT CONTRAST TECHNIQUE: Contiguous axial images were obtained from the base of the skull through the vertex without intravenous contrast. COMPARISON:  MR head 01/19/2019. CT head 01/20/2019 FINDINGS: The patient was unable to remain motionless for the exam. Small or subtle lesions could be overlooked. Brain: No visible acute stroke. Generalized atrophy. Scattered areas of hypodensity in the cerebellum and cerebral hemispheres, consistent with subacute infarction. Resolving subarachnoid hemorrhage LEFT posterior frontal sulcus and dependent LEFT sylvian fissure, improved from priors. No definite new areas of hemorrhage. Vascular: Calcification of the cavernous internal carotid arteries consistent with cerebrovascular atherosclerotic disease. No signs of intracranial large vessel occlusion. Skull: No fracture.  RIGHT frontal craniotomy/craniectomy. Sinuses/Orbits: No acute finding. Other: None. IMPRESSION: Within limits for assessment due to motion degraded exam, improving subarachnoid hemorrhage with no definite new areas of infarction. If further investigation warranted, and the patient is able to cooperate, recommend repeat MRI brain. Electronically Signed   By: Elsie Stain M.D.   On: 02/06/2019 10:32   Dg Chest Port 1 View Result Date:  02/03/2019 CLINICAL DATA:  Ventricular tachycardia EXAM: PORTABLE CHEST 1 VIEW COMPARISON:  January 21, 2019. FINDINGS: Central catheter tip is in the left innominate vein. No pneumothorax. There is no evident edema or consolidation. There is generalized cardiomegaly with pulmonary vascularity normal. There is a prosthetic aortic valve. There is aortic atherosclerosis. No evident adenopathy. No bone lesions. IMPRESSION: Central catheter tip in left innominate vein. No pneumothorax. Generalized cardiomegaly. No edema or consolidation. Aortic Atherosclerosis (ICD10-I70.0). Electronically Signed   By: Bretta Bang III M.D.   On: 02/03/2019 14:28   Dg Abd Portable 1v  Result Date: 02/07/2019 CLINICAL DATA:  Check feeding catheter placement EXAM: PORTABLE ABDOMEN - 1 VIEW COMPARISON:  None. FINDINGS: Feeding catheter is noted within the stomach. Cardiac shadow is enlarged. Changes of prior TAVR are noted. No other focal abnormality is seen. IMPRESSION: Feeding catheter within the stomach. Electronically Signed   By: Alcide Clever M.D.   On: 02/07/2019 12:13   Dg Abd Portable 1v  Result Date: 02/06/2019 CLINICAL DATA:  NG tube placement. EXAM: PORTABLE ABDOMEN - 1 VIEW COMPARISON:  None. FINDINGS: Nasogastric tube courses into the stomach and turns 180 degrees back on itself over the region of the gastroesophageal junction with tip just above the gastroesophageal junction. Bowel gas pattern is nonobstructive. Degenerative change of the spine. Partially visualized prosthetic aortic valve is present as well as partially visualized right hip arthroplasty. IMPRESSION: Nonobstructive bowel gas pattern. Nasogastric tube extends just below the gastroesophageal junction where it turns back 180 degrees on itself with tip just above the gastroesophageal junction. Electronically Signed  By: Elberta Fortis M.D.   On: 02/06/2019 12:45     Disposition   Pt is being discharged home today in good condition.  Follow-up  Plans & Appointments    Follow-up Information    [facility] Follow up in 4 week(s).   Specialty:  Neurology Why:  stroke clinic. office will call with appt date and time.  Contact information: [address] [place] [address] [phone]       Runell Gess, MD. Schedule an appointment as soon as possible for a visit.   Specialties:  Cardiology, Radiology Why:  See Dr. Allyson Sabal in approximately 2 months.  Call next month for an appointment. Follow-up is delayed due to COVID 19 (Corona virus). Contact information: [address] [name] [address] [phone]          Discharge Instructions    Amb Referral to Cardiac Rehabilitation   Complete by:  As directed    Diagnosis:  NSTEMI   Ambulatory referral to Neurology   Complete by:  As directed    Follow up with stroke clinic NP (Jessica Vanschaick or Darrol Angel, if both not available, consider Dr. Delia Heady, Dr. Jamelle Rushing, or Dr. Naomie Dean) at [facility] in about 4 weeks.  Pt headed back to inpatient rehab soon for his stroke. Make sure he has an appt   Diet - low sodium heart healthy   Complete by:  As directed    Increase activity slowly   Complete by:  As directed       Discharge Medications   Allergies as of 02/12/2019      Reactions   Kayexalate [polystyrene] Other (See Comments)   Recommended to avoid by GI due to ischemic gastropathy      Medication List    STOP taking these medications   aspirin 81 MG chewable tablet   diltiazem 120 MG 24 hr capsule Commonly known as:  Cardizem CD   ergocalciferol 1.25 MG (50000 UT) capsule Commonly known as:  VITAMIN D2   spironolactone 25 MG tablet Commonly known as:  Aldactone   torsemide 20 MG tablet Commonly known as:  DEMADEX     TAKE these medications   acetaminophen 650 MG CR tablet Commonly known as:  TYLENOL Take 650 mg by mouth 2 (two) times  daily as needed for pain.   amLODipine 5 MG tablet Commonly known as:  NORVASC Take 1 tablet (5 mg total) by mouth daily. Start taking on:  February 13, 2019   amoxicillin 500 MG capsule Commonly known as:  AMOXIL Take 2,000 mg (4 tablets) one hour prior to dental visits.   apixaban 5 MG Tabs tablet Commonly known as:  Eliquis Take 1 tablet (5 mg total) by mouth 2 (two) times daily.   atorvastatin 80 MG tablet Commonly known as:  LIPITOR TAKE 1 TABLET BY MOUTH ONCE DAILY WITH  BREAKFAST What changed:  See the new instructions.   colchicine 0.6 MG tablet Take 1 tablet (0.6 mg total) by mouth as needed (Gout flare up.).   feeding supplement (PRO-STAT SUGAR FREE 64) Liqd Take 30 mLs by mouth 2 (two) times daily.   furosemide 40 MG tablet Commonly known as:  LASIX Take 1 tablet (40 mg total) by mouth daily. Start taking on:  February 13, 2019   lip balm ointment Apply topically 2 (two) times daily.   metoprolol tartrate 50 MG tablet Commonly known as:  LOPRESSOR Take 1 tablet (  50 mg total) by mouth 2 (two) times daily.   nitroGLYCERIN 0.4 MG SL tablet Commonly known as:  NITROSTAT Place 1 tablet (0.4 mg total) under the tongue every 5 (five) minutes x 3 doses as needed for chest pain.   pantoprazole 40 MG tablet Commonly known as:  PROTONIX Take 1 tablet (40 mg total) by mouth 2 (two) times daily.   traMADol-acetaminophen 37.5-325 MG tablet Commonly known as:  ULTRACET Take 1-2 tablets by mouth every 6 (six) hours as needed for moderate pain or severe pain.        Aspirin prescribed at discharge?  No: UGIB High Intensity Statin Prescribed? (Lipitor 40-80mg  or Crestor 20-40mg ): Yes Beta Blocker Prescribed? Yes For EF <40%, was ACEI/ARB Prescribed? No: EF is greater than 40% ADP Receptor Inhibitor Prescribed? (i.e. Plavix etc.-Includes Medically Managed Patients): No: GI bleed and treated medically, no PCI For EF <40%, Aldosterone Inhibitor Prescribed? No: EF greater  than 40 Was EF assessed during THIS hospitalization? Yes Was Cardiac Rehab II ordered? (Included Medically managed Patients): Yes   Outstanding Labs/Studies   None  Duration of Discharge Encounter   Greater than 30 minutes including physician time.  Bobbye Riggs Lachae Hohler NP 02/12/2019, 12:23 PM

## 2019-02-12 NOTE — Discharge Instructions (Addendum)
Inpatient Rehab Discharge Instructions  Joel Rogers Discharge date and time: No discharge date for patient encounter.   Activities/Precautions/ Functional Status: Activity: activity as tolerated Diet: regular diet Wound Care: none needed Functional status:  ___ No restrictions     ___ Walk up steps independently ___ 24/7 supervision/assistance   ___ Walk up steps with assistance ___ Intermittent supervision/assistance  ___ Bathe/dress independently ___ Walk with walker     _x__ Bathe/dress with assistance ___ Walk Independently    ___ Shower independently ___ Walk with assistance    ___ Shower with assistance ___ No alcohol     ___ Return to work/school ________  Special Instructions: No driving    COMMUNITY REFERRALS UPON DISCHARGE:    Home Health:   PT, OT, RN     Agency:[facility]   Phone:[phone]   Date of last service:03/12/2019  Medical Equipment/Items Ordered:HOSPITAL BED, WHEELCHAIR, WIDE ROLLING WALKER, WIDE DROP-ARM BEDSIDE COMMODE, 30 TRANSFER BOARD  Agency/Supplier:[facility]  [phone]   GENERAL COMMUNITY RESOURCES FOR PATIENT/FAMILY: Support Groups:[facility]PPORT [facility] THE SECOND Thursday ( SEPT-MAY ) ON THE REHAB UNIT @ 6:00-7:00 PM QUESTIONS CALL AMY [phone]  STROKE/TIA DISCHARGE INSTRUCTIONS SMOKING Cigarette smoking nearly doubles your risk of having a stroke & is the single most alterable risk factor  If you smoke or have smoked in the last 12 months, you are advised to quit smoking for your health.  Most of the excess cardiovascular risk related to smoking disappears within a year of stopping.  Ask you doctor about anti-smoking medications  [place] Quit Line: 1-800-QUIT NOW  Free Smoking Cessation Classes [phone]  CHOLESTEROL Know your levels; limit fat & cholesterol in your diet  Lipid Panel     Component Value Date/Time   CHOL 104 02/04/2019 0445   TRIG 53 02/04/2019 0445   HDL 35 (L) 02/04/2019 0445   CHOLHDL 3.0  02/04/2019 0445   VLDL 11 02/04/2019 0445   LDLCALC 58 02/04/2019 0445   LDLCALC 70 05/01/2018 0913      Many patients benefit from treatment even if their cholesterol is at goal.  Goal: Total Cholesterol (CHOL) less than 160  Goal:  Triglycerides (TRIG) less than 150  Goal:  HDL greater than 40  Goal:  LDL (LDLCALC) less than 100   BLOOD PRESSURE American Stroke Association blood pressure target is less that 120/80 mm/Hg  Your discharge blood pressure is:  BP: 122/73  Monitor your blood pressure  Limit your salt and alcohol intake  Many individuals will require more than one medication for high blood pressure  DIABETES (A1c is a blood sugar average for last 3 months) Goal HGBA1c is under 7% (HBGA1c is blood sugar average for last 3 months)  Diabetes: No known diagnosis of diabetes    Lab Results  Component Value Date   HGBA1C 5.8 (H) 07/21/2018     Your HGBA1c can be lowered with medications, healthy diet, and exercise.  Check your blood sugar as directed by your physician  Call your physician if you experience unexplained or low blood sugars.  PHYSICAL ACTIVITY/REHABILITATION Goal is 30 minutes at least 4 days per week  Activity: Increase activity slowly, Therapies: Physical Therapy: Home Health Return to work:   Activity decreases your risk of heart attack and stroke and makes your heart stronger.  It helps control your weight and blood pressure; helps you relax and can improve your mood.  Participate in a regular exercise program.  Talk with your doctor about the best  form of exercise for you (dancing, walking, swimming, cycling).  DIET/WEIGHT Goal is to maintain a healthy weight  Your discharge diet is:  Diet Order            Diet regular Room service appropriate? Yes; Fluid consistency: Thin  Diet effective now              liquids Your height is:    Your current weight is: Weight: 117.2 kg Your Body Mass Index (BMI) is:  BMI (Calculated): 34.1   Following the type of diet specifically designed for you will help prevent another stroke.  Your goal weight range is:    Your goal Body Mass Index (BMI) is 19-24.  Healthy food habits can help reduce 3 risk factors for stroke:  High cholesterol, hypertension, and excess weight.  RESOURCES Stroke/Support Group:  Call [phone]   STROKE EDUCATION PROVIDED/REVIEWED AND GIVEN TO PATIENT Stroke warning signs and symptoms How to activate emergency medical system (call 911). Medications prescribed at discharge. Need for follow-up after discharge. Personal risk factors for stroke. Pneumonia vaccine given:  Flu vaccine given:  My questions have been answered, the writing is legible, and I understand these instructions.  I will adhere to these goals & educational materials that have been provided to me after my discharge from the hospital.      My questions have been answered and I understand these instructions. I will adhere to these goals and the provided educational materials after my discharge from the hospital.  Patient/Caregiver Signature _______________________________ Date __________  Clinician Signature _______________________________________ Date __________  Please bring this form and your medication list with you to all your follow-up doctor's appointments. Information on my medicine - ELIQUIS (apixaban)  Why was Eliquis prescribed for you? Eliquis was prescribed for you to reduce the risk of a blood clot forming that can cause a stroke if you have a medical condition called atrial fibrillation (a type of irregular heartbeat).  What do You need to know about Eliquis ? Take your Eliquis TWICE DAILY - one tablet in the morning and one tablet in the evening with or without food. If you have difficulty swallowing the tablet whole please discuss with your pharmacist how to take the medication safely.  Take Eliquis exactly as prescribed by your doctor and DO NOT stop taking  Eliquis without talking to the doctor who prescribed the medication.  Stopping may increase your risk of developing a stroke.  Refill your prescription before you run out.  After discharge, you should have regular check-up appointments with your healthcare provider that is prescribing your Eliquis.  In the future your dose may need to be changed if your kidney function or weight changes by a significant amount or as you get older.  What do you do if you miss a dose? If you miss a dose, take it as soon as you remember on the same day and resume taking twice daily.  Do not take more than one dose of ELIQUIS at the same time to make up a missed dose.  Important Safety Information A possible side effect of Eliquis is bleeding. You should call your healthcare provider right away if you experience any of the following: ? Bleeding from an injury or your nose that does not stop. ? Unusual colored urine (red or dark brown) or unusual colored stools (red or black). ? Unusual bruising for unknown reasons. ? A serious fall or if you hit your head (even if there is no bleeding).  Some medicines may interact with Eliquis and might increase your risk of bleeding or clotting while on Eliquis. To help avoid this, consult your healthcare provider or pharmacist prior to using any new prescription or non-prescription medications, including herbals, vitamins, non-steroidal anti-inflammatory drugs (NSAIDs) and supplements.  This website has more information on Eliquis (apixaban): [email or web address]://[email or web address].[email or web address]

## 2019-02-12 NOTE — Progress Notes (Signed)
ANTICOAGULATION CONSULT NOTE - Follow Up Consult  Pharmacy Consult for Eliquis Indication: atrial fibrillation  Allergies  Allergen Reactions  . Kayexalate [Polystyrene] Other (See Comments)    Recommended to avoid by GI due to ischemic gastropathy    Patient Measurements: Height: 6\' 1"  (185.4 cm) Weight: 258 lb (117 kg) IBW/kg (Calculated) : 79.9  Vital Signs: Temp: 98.3 F (36.8 C) (03/23 0516) Temp Source: Oral (03/23 0516) BP: 135/77 (03/23 0516) Pulse Rate: 69 (03/23 0516)  Labs: Recent Labs    02/10/19 0340 02/11/19 0409 02/12/19 0348  HGB 7.9* 7.9* 7.8*  HCT 25.3* 25.2* 23.8*  PLT 206 224 224  CREATININE 0.83 0.80  --     Estimated Creatinine Clearance: 118.4 mL/min (by C-G formula based on SCr of 0.8 mg/dL).  Assessment: Anticoag: hx afib on apixaban pta, held with GIB and recent SAH on admit to rehab. New NSTEMI: Dose  5 mg BID w/ stable CBC and no bleeding. Hgb 7.8 stable. Plts stable.  Goal of Therapy:  Therapeutic oral anticoagulation   Plan:  -eliquis 5 mg BID  Joel Rogers, PharmD, BCPS Clinical Staff Pharmacist Misty Stanley Stillinger 02/12/2019,9:14 AM

## 2019-02-12 NOTE — Progress Notes (Signed)
Admit to unit, reviewed orders, rehab schedule, medications and plan of care.states an understanding of information reviewed. Joel Rogers

## 2019-02-12 NOTE — Progress Notes (Addendum)
Gave report to nurse on 4W.  Called wife to let her know he was being transferred.   Will call to let her know room number.   Called Wife- let her know room number and number for nurse station

## 2019-02-13 ENCOUNTER — Inpatient Hospital Stay (HOSPITAL_COMMUNITY): Payer: Self-pay

## 2019-02-13 ENCOUNTER — Inpatient Hospital Stay (HOSPITAL_COMMUNITY): Payer: Self-pay | Admitting: Occupational Therapy

## 2019-02-13 ENCOUNTER — Inpatient Hospital Stay (HOSPITAL_COMMUNITY): Payer: Self-pay | Admitting: Physical Therapy

## 2019-02-13 DIAGNOSIS — I6350 Cerebral infarction due to unspecified occlusion or stenosis of unspecified cerebral artery: Secondary | ICD-10-CM

## 2019-02-13 DIAGNOSIS — I69351 Hemiplegia and hemiparesis following cerebral infarction affecting right dominant side: Principal | ICD-10-CM

## 2019-02-13 DIAGNOSIS — I69398 Other sequelae of cerebral infarction: Secondary | ICD-10-CM

## 2019-02-13 DIAGNOSIS — R269 Unspecified abnormalities of gait and mobility: Secondary | ICD-10-CM

## 2019-02-13 LAB — COMPREHENSIVE METABOLIC PANEL
ALT: 13 U/L (ref 0–44)
AST: 23 U/L (ref 15–41)
Albumin: 1.9 g/dL — ABNORMAL LOW (ref 3.5–5.0)
Alkaline Phosphatase: 104 U/L (ref 38–126)
Anion gap: 9 (ref 5–15)
BUN: 18 mg/dL (ref 8–23)
CO2: 25 mmol/L (ref 22–32)
Calcium: 8.2 mg/dL — ABNORMAL LOW (ref 8.9–10.3)
Chloride: 107 mmol/L (ref 98–111)
Creatinine, Ser: 0.84 mg/dL (ref 0.61–1.24)
GFR calc Af Amer: >60 mL/min (ref >60)
GFR calc non Af Amer: >60 mL/min (ref >60)
Glucose, Bld: 95 mg/dL (ref 70–99)
Potassium: 3.9 mmol/L (ref 3.5–5.1)
Sodium: 141 mmol/L (ref 135–145)
Total Bilirubin: 1.2 mg/dL (ref 0.3–1.2)
Total Protein: 6 g/dL — ABNORMAL LOW (ref 6.5–8.1)

## 2019-02-13 LAB — CBC WITH DIFFERENTIAL/PLATELET
Abs Immature Granulocytes: 0.07 10*3/uL (ref 0.00–0.07)
Basophils Absolute: 0.1 10*3/uL (ref 0.0–0.1)
Basophils Relative: 1 %
Eosinophils Absolute: 0.1 10*3/uL (ref 0.0–0.5)
Eosinophils Relative: 1 %
HCT: 25.4 % — ABNORMAL LOW (ref 39.0–52.0)
Hemoglobin: 8 g/dL — ABNORMAL LOW (ref 13.0–17.0)
Immature Granulocytes: 1 %
Lymphocytes Relative: 11 %
Lymphs Abs: 1.6 10*3/uL (ref 0.7–4.0)
MCH: 26.6 pg (ref 26.0–34.0)
MCHC: 31.5 g/dL (ref 30.0–36.0)
MCV: 84.4 fL (ref 80.0–100.0)
Monocytes Absolute: 1.3 10*3/uL — ABNORMAL HIGH (ref 0.1–1.0)
Monocytes Relative: 9 %
Neutro Abs: 11.8 10*3/uL — ABNORMAL HIGH (ref 1.7–7.7)
Neutrophils Relative %: 77 %
Platelets: 228 10*3/uL (ref 150–400)
RBC: 3.01 MIL/uL — ABNORMAL LOW (ref 4.22–5.81)
RDW: 19.9 % — ABNORMAL HIGH (ref 11.5–15.5)
WBC: 14.9 10*3/uL — ABNORMAL HIGH (ref 4.0–10.5)
nRBC: 0.3 % — ABNORMAL HIGH (ref 0.0–0.2)

## 2019-02-13 LAB — GLUCOSE, CAPILLARY: Glucose-Capillary: 84 mg/dL (ref 70–99)

## 2019-02-13 NOTE — Progress Notes (Addendum)
[facility] PHYSICAL MEDICINE & REHABILITATION PROGRESS NOTE   Subjective/Complaints: Discussed with OT, Amil Amen, pt had some CP during bed mobility last  noc  ROS- denies SOB, Nausea , vomiting , diarrhea, chest is sore at lower ribs   Objective:   No results found. Recent Labs    02/11/19 0409 02/12/19 0348  WBC 15.4* 15.3*  HGB 7.9* 7.8*  HCT 25.2* 23.8*  PLT 224 224   Recent Labs    02/11/19 0409  NA 141  K 3.4*  CL 111  CO2 25  GLUCOSE 96  BUN 20  CREATININE 0.80  CALCIUM 8.1*   No intake or output data in the 24 hours ending 02/13/19 0544   Physical Exam: Vital Signs Blood pressure 119/67, pulse 66, temperature 98.9 F (37.2 C), temperature source Oral, resp. rate 18, weight 117.4 kg, SpO2 100 %.  General: No acute distress Mood and affect are appropriate Heart: Regular rate and rhythm no rubs murmurs or extra sounds Chest tenderness to palpation sterno xiphoid junction Lungs: Clear to auscultation, breathing unlabored, no rales or wheezes Abdomen: Positive bowel sounds, soft nontender to palpation, nondistended Extremities:3+ edema RUE, 2+ edema RLE Skin: No evidence of breakdown, no evidence of rash Neurologic: Cranial nerves II through XII intact, motor strength is 5/5 in left and 3- RIght  deltoid, bicep, tricep, grip, hip flexor,2- right and 3- left knee extensors, ankle dorsiflexor and plantar flexor  Sensory exam normal sensation to light touch and proprioception in bilateral upper and lower extremities Cerebellar exam normal finger to nose to finger as well as heel to shin in bilateral upper and lower extremities Musculoskeletal: Full range of motion in all 4 extremities. No joint swelling    Assessment/Plan: 1. Functional deficits secondary to cardioembolic bicerebral infarcts which require 3+ hours per day of interdisciplinary therapy in a comprehensive inpatient rehab setting.  Physiatrist is providing close team supervision and 24 hour  management of active medical problems listed below.  Physiatrist and rehab team continue to assess barriers to discharge/monitor patient progress toward functional and medical goals  Care Tool:  Bathing              Bathing assist       Upper Body Dressing/Undressing Upper body dressing   What is the patient wearing?: Hospital gown only    Upper body assist Assist Level: Total Assistance - Patient < 25%    Lower Body Dressing/Undressing Lower body dressing      What is the patient wearing?: Incontinence brief     Lower body assist Assist for lower body dressing: Total Assistance - Patient < 25%     Toileting Toileting    Toileting assist Assist for toileting: Moderate Assistance - Patient 50 - 74%     Transfers Chair/bed transfer  Transfers assist  Chair/bed transfer activity did not occur: Safety/medical concerns        Locomotion Ambulation   Ambulation assist              Walk 10 feet activity   Assist           Walk 50 feet activity   Assist           Walk 150 feet activity   Assist           Walk 10 feet on uneven surface  activity   Assist           Wheelchair     Assist  Wheelchair 50 feet with 2 turns activity    Assist            Wheelchair 150 feet activity     Assist            Medical Problem List and Plan: 1.Persistent right side weakness with decreased mobilitysecondary to cardioembolic bi-cerebral infarction -CIR PT, OT, SLP evals today 2. Antithrombotics: -DVT/anticoagulation:ELIQUIS -antiplatelet therapy:  3. Pain Management:Ultracet as needed 4. Mood:Provide emotional support -antipsychotic agents: none 5. Neuropsych: This patientiscapable of making decisions on hisown behalf. 6. Skin/Wound Care:Routine skin checks 7. Fluids/Electrolytes/Nutrition:Routine in and out's with follow-up  chemistries 8. Upper GI bleed EGD 3. Follow-up gastrin neurology services. Continue PPI -serial H/H's while on eliquis 9. Acute blood loss anemia. Patient transfused throughout hospital course. Follow-up CBC, hgb has been 7.5-8 g 10. Chronic atrial fibrillation with TAVR procedure September 2019 11. Non-STEMI. Thought secondary to thrombus status post cardiac catheterization. Continue eliquis 12. Acute on chronic diastolic congestive heart failure. Lasix 40 mg daily -daily weights stable Filed Weights   02/12/19 1430 02/13/19 0602  Weight: 117.4 kg 117.2 kg  13. Hypertension. Norvasc 5 mg daily, Lopressor 50 mg twice a day. Monitor with increased mobility Vitals:   02/12/19 1516 02/12/19 1930  BP: 122/85 119/67  Pulse: 77 66  Resp: 16 18  Temp: 98.5 F (36.9 C) 98.9 F (37.2 C)  SpO2: 100% 100%  Controlled 3/24 14. Hyperlipidemia. Lipitor  LOS: 1 days A FACE TO FACE EVALUATION WAS PERFORMED  Erick Colace 02/13/2019, 5:44 AM

## 2019-02-13 NOTE — Progress Notes (Signed)
Denies pain central line removed. Condom cath removed during shift.  Patient resting in bed times denies pain call light within reach.  Kalman Shan, LPN.

## 2019-02-13 NOTE — Evaluation (Addendum)
Occupational Therapy Assessment and Plan  Patient Details  Name: Joel Rogers MRN: [id number] Date of Birth: Aug 09, 1951  OT Diagnosis: acute pain, hemiplegia affecting dominant side, muscle weakness (generalized) and swelling of limb Rehab Potential: Rehab Potential (ACUTE ONLY): Fair ELOS: 28-30 days   Today's Date: 02/13/2019 OT Individual Time: 2703-5009 OT Individual Time Calculation (min): 75 min     Problem List:  Patient Active Problem List   Diagnosis Date Noted  . Posterior circulation stroke ([place]) 02/12/2019  . Cerebral embolism with cerebral infarction 02/09/2019  . Malnutrition of moderate degree 02/08/2019  . Pressure injury of skin 02/07/2019  . VT (ventricular tachycardia) ([place])   . Acute coronary syndrome ([place]) 02/03/2019  . VF (ventricular fibrillation) ([place]) causing cardiac arrest 02/03/2019  . Acute bilateral cerebral infarction in a watershed distribution [facility]) 01/30/2019  . UGIB (upper gastrointestinal bleed) 01/26/2019  . Bacteremia due to Streptococcus 01/26/2019  . Cellulitis 01/26/2019  . Upper GI bleed   . Acute CVA (cerebrovascular accident) ([place]) 01/19/2019  . Acute renal failure ([place]) 01/14/2019  . Right shoulder pain 01/14/2019  . Sepsis ([place]) 01/13/2019  . S/P TAVR (transcatheter aortic valve replacement) 07/25/2018  . History of subdural hematoma   . Atrial fibrillation, chronic   . Acute on chronic diastolic heart failure ([place]) 06/15/2018  . Pulmonary hypertension, unspecified ([place]) 02/15/2018  . Varicose veins of right lower extremity with complications 38/18/2993  . History of Severe aortic stenosis 08/09/2017  . Vitamin D deficiency 12/02/2016  . Status post gastric bypass for obesity 11/02/2016  . HTN (hypertension) 04/13/2013  . Hyperlipemia 04/13/2013  . Chronic atrial fibrillation 02/14/2013  . Non-ST elevation (NSTEMI) myocardial infarction ([place]) 06/25/2008  . Morbid obesity ([place]) 06/18/2008  . Obstructive sleep apnea 06/18/2008     Past Medical History:  Past Medical History:  Diagnosis Date  . Anemia    low iron  . Arthritis    bilateral knees  . Atrial fibrillation, chronic   . Chronic diastolic CHF (congestive heart failure) ([place]) 06/15/2018  . History of subdural hematoma   . Hyperlipidemia   . Hypertension   . Morbid obesity ([place])   . Myocardial infarction ([place])   . Normal coronary arteries    by cardiac catheterization performed 03/14/06  . Pre-diabetes    pt denies  . S/P TAVR (transcatheter aortic valve replacement)    a. 07/25/18: Edwards Sapien 3 THV (size 26 mm, model # 9600TFX, serial # [id number]) by Dr. Cyndia Bent and Dr. Angelena Form  . Sleep apnea    on CPAP  . Stroke ([place])   . Venous insufficiency   . VF (ventricular fibrillation) ([place]) causing cardiac arrest 02/03/2019   Past Surgical History:  Past Surgical History:  Procedure Laterality Date  . BIOPSY  01/26/2019   Procedure: BIOPSY;  Surgeon: Ronald Lobo, MD;  Location[facility] ENDOSCOPY;  Service: Endoscopy;;  . CRANIOTOMY Right 08/27/2016   Procedure: CRANIOTOMY HEMATOMA EVACUATION SUBDURAL;  Surgeon: Erline Levine, MD;  Location: [place];  Service: Neurosurgery;  Laterality: Right;  . ESOPHAGOGASTRODUODENOSCOPY N/A 01/21/2019   Procedure: ESOPHAGOGASTRODUODENOSCOPY (EGD);  Surgeon: Clarene Essex, MD;  Location: [place];  Service: Endoscopy;  Laterality: N/A;  . ESOPHAGOGASTRODUODENOSCOPY (EGD) WITH PROPOFOL N/A 01/22/2019   Procedure: ESOPHAGOGASTRODUODENOSCOPY (EGD) WITH PROPOFOL;  Surgeon: Ronald Lobo, MD;  Location: [place];  Service: Endoscopy;  Laterality: N/A;  . ESOPHAGOGASTRODUODENOSCOPY (EGD) WITH PROPOFOL N/A 01/26/2019   Procedure: ESOPHAGOGASTRODUODENOSCOPY (EGD) WITH PROPOFOL;  Surgeon: Ronald Lobo, MD;  Location: [place];  Service: Endoscopy;  Laterality: N/A;  . GASTRIC BYPASS  09/2008  . JOINT REPLACEMENT  08/31/2006   right hip  . LEFT HEART CATH AND CORONARY ANGIOGRAPHY N/A 02/05/2019   Procedure: LEFT HEART  CATH AND CORONARY ANGIOGRAPHY;  Surgeon: Jettie Booze, MD;  Location: [place] [facility];  Service: Cardiovascular;  Laterality: N/A;  . MULTIPLE EXTRACTIONS WITH ALVEOLOPLASTY N/A 07/13/2018   Procedure: Extraction of tooth #'s 3,8,10, 23-26, 30and 32 with alveoloplasty and gross debridement of remaining teeth;  Surgeon: Lenn Cal, DDS;  Location: [place];  Service: Oral Surgery;  Laterality: N/A;  . RIGHT HEART CATH N/A 07/06/2018   Procedure: RIGHT HEART CATH;  Surgeon: Burnell Blanks, MD;  Location: [facility];  Service: Cardiovascular;  Laterality: N/A;  . RIGHT/LEFT HEART CATH AND CORONARY ANGIOGRAPHY N/A 07/10/2018   Procedure: RIGHT/LEFT HEART CATH AND CORONARY ANGIOGRAPHY;  Surgeon: Jolaine Artist, MD;  Location: [place] [facility];  Service: Cardiovascular;  Laterality: N/A;  . TEE WITHOUT CARDIOVERSION N/A 07/25/2018   Procedure: TRANSESOPHAGEAL ECHOCARDIOGRAM (TEE);  Surgeon: Burnell Blanks, MD;  Location: [place];  Service: Open Heart Surgery;  Laterality: N/A;  . TEE WITHOUT CARDIOVERSION N/A 01/18/2019   Procedure: TRANSESOPHAGEAL ECHOCARDIOGRAM (TEE) WITH PROPOFOL;  Surgeon: Satira Sark, MD;  Location: [facility];  Service: Cardiovascular;  Laterality: N/A;  . TOTAL HIP ARTHROPLASTY     right hip  . TRANSCATHETER AORTIC VALVE REPLACEMENT, TRANSFEMORAL  07/25/2018  . TRANSCATHETER AORTIC VALVE REPLACEMENT, TRANSFEMORAL N/A 07/25/2018   Procedure: TRANSCATHETER AORTIC VALVE REPLACEMENT, TRANSFEMORAL;  Surgeon: Burnell Blanks, MD;  Location: [place];  Service: Open Heart Surgery;  Laterality: N/A;    Assessment & Plan Clinical Impression: Joel Rogers is a 68 year old right-handed male with history of diastolic congestive heart failure, aortic stenosis with TAVR procedure September 2019,atrial fibrillation maintained on Eliquis, OSA with CPAP, hypertension. Per chart review patient lives with spouse community ambulator and active prior  to admission. Initially [facility]13[facility]2019 secondary to right upper extremity pain and recent fall with right sided weakness findings of sepsis due to Staphylococcus bacteremia and cellulitis during admission maintain on antibiotic therapy. Noted persistent right side weakness. MRI the brain revealed multiple acute ischemic infarcts in a pattern most consistent with cardioembolic stroke. He was transferred to [facility] for further evaluation. Venous Doppler studies negative for DVTs. Follow-up MRI the brain with numerous scattered foci of acute ischemia throughout the cerebrum and cerebellum. No midline shift or mass effect. No proximal intracranial arterial occlusion. Echocardiogram with ejection fraction of 60% and normal systolic function. TEE showed no vegetation on bioprosthetic aortic valve. Patient remained on eliquis for atrial fibrillation with the addition of aspirin for CVA prophylaxis. He developed coffee-ground emesis with acute blood loss anemia requiring transfusion totaling 6 units pack red blood cells with gastroenterology consulted for EGD completed 2 with findings of red blood in the lower third of the esophagus clotted blood in the entire stomach. Blood thinners were held and follow-up GI endoscopy again completed 01/26/2019 showing no active bleeding or blood present. Recommendations were to hold aspirin and eliquis for approximately 3-5 days and then resume. He was placed on Protonix twice daily.hospital course complicated by AKI with creatinine baseline 1.1-1.23 elevated to 1.95 renal services consulted likely secondary to ischemic ATN in the setting of sepsis maintained on gentle IV fluids creatinine improved to 1.00. Therapy evaluations completed patient was admitted for a comprehensive rehabilitation program 01/30/2019. On 02/03/2019 patient  with burning chest pain felt like acid reflux he did receive a dose of Maalox. Denied shortness of breath. Patient  became unresponsive and code was called and noted to be in ventricular fibrillation. Troponin was noted to be elevated. He was discharged to acute care services 02/03/2019 maintain on intravenous heparin and followed by cardiology services. Echocardiogram was repeated with ejection fraction greater than 65% hyperdynamic systolic function.cardiac catheterization same day showed  Lat Ramus lesion 99% stenosed. Heavy thrombus. No other significant CAD. His Eliquis was resumed and monitor for any bleeding episodes. Nasogastric tube initially in place and diet has been advanced to regular consistency. Therapies have been resumed patient tolerating nicely and was readmitted back to inpatient rehabilitation services.   Patient transferred to CIR on 02/12/2019 .    Patient currently requires total with basic self-care skills secondary to muscle weakness and muscle joint tightness, decreased cardiorespiratoy endurance, decreased coordination and decreased sitting balance, decreased standing balance, decreased postural control, hemiplegia and decreased balance strategies.  Prior to hospitalization, patient was independent, active, and driving.  Patient will benefit from skilled intervention to increase independence with basic self-care skills prior to discharge home with care partner.  Anticipate patient will require 24 hour supervision and minimal physical assistance and follow up home health.  OT - End of Session Activity Tolerance: Tolerates < 10 min activity, no significant change in vital signs Endurance Deficit: Yes Endurance Deficit Description: generalized deconditioning,  limited by pain OT Assessment Rehab Potential (ACUTE ONLY): Fair OT Barriers to Discharge: Home environment access/layout OT Barriers to Discharge Comments: steps to enter OT Patient demonstrates impairments in the following area(s): Balance;Endurance;Motor;Pain;Edema;Safety OT Basic ADL's Functional Problem(s):  Grooming;Bathing;Dressing;Eating OT Transfers Functional Problem(s): Toilet;Tub/Shower OT Additional Impairment(s): Fuctional Use of Upper Extremity OT Plan OT Intensity: Minimum of 1-2 x/day, 45 to 90 minutes OT Frequency: 5 out of 7 days OT Duration/Estimated Length of Stay: 28-30 days OT Treatment/Interventions: Balance/vestibular training;Discharge planning;Disease Lawyer;Functional electrical stimulation;Functional mobility training;Neuromuscular re-education;Pain management;Patient/family education;Psychosocial support;Self Care/advanced ADL retraining;Skin care/wound managment;Splinting/orthotics;Therapeutic Activities;Therapeutic Exercise;UE/LE Strength taining/ROM;UE/LE Coordination activities OT Self Feeding Anticipated Outcome(s): set up OT Basic Self-Care Anticipated Outcome(s): min A OT Toileting Anticipated Outcome(s): min A OT Bathroom Transfers Anticipated Outcome(s): min A OT Recommendation Patient destination: Home Follow Up Recommendations: Home health OT;24 hour supervision/assistance Equipment Recommended: Tub/shower bench;3 in 1 bedside comode Equipment Details: wide drop arm commode   Skilled Therapeutic Intervention  Pt seen for initial evaluation and ADL training from bed level.  Discussed his recent hospital stay in [place], what transpired and now what challenges he has. He understands the rehab process and had a good memory of what he worked on in therapy.  Obtained a wide w/c with elevating leg rests, slide board, etc to get pt out of bed. Once he began moving with rolling partially in bed, he had significant pain in ribs/sternum. It was apparent that self care needed to be performed from bed level at this time as this therapist did not have a plus 2 A.   Pt needed several rest breaks, as he got short of breath easily.  Overall he needs max A UB and total A LB due to pain, B weakness with R weaker than the L, body habitus,  edema.  Discussed [place], goals, POC.  Pt resting in bed with all needs met.  OT Evaluation Precautions/Restrictions  Precautions Precautions: Fall Restrictions Weight Bearing Restrictions: No Pain Pain Assessment Pain Score: 7 (only with movement such as rolling in bed, pain in  ribs/ sternum) Home Living/Prior Functioning Home Living Available Help at Discharge: Family, Available 24 hours/day Type of Home: House Home Access: Stairs to enter [employer] of Steps: 3 Entrance Stairs-Rails: Right Home Layout: One level Bathroom Shower/Tub: Tub/shower unit, Multimedia programmer: Handicapped height Bathroom Accessibility: Yes Additional Comments: Patient's wife can only provide supervision assistance, sisters and sons may be available to provide physical assistance.  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 This was prior to Feb 2020 admission. ADL ADL Upper Body Bathing: Maximal assistance Where Assessed-Upper Body Bathing: Bed level Lower Body Bathing: Dependent Where Assessed-Lower Body Bathing: Bed level Upper Body Dressing: Maximal assistance Where Assessed-Upper Body Dressing: Bed level Lower Body Dressing: Dependent Where Assessed-Lower Body Dressing: Bed level Vision Baseline Vision/History: Wears glasses Wears Glasses: Distance only Patient Visual Report: No change from baseline Vision Assessment?: No apparent visual deficits Perception  Perception: (to be assessed further in functional context , but no apparent deficits on  evaluation) Praxis Praxis: (to be assessed further in functional context , but no apparent deficits on evaluation) Cognition Overall Cognitive Status: Within Functional Limits for tasks assessed Arousal/Alertness: Awake/alert Orientation Level: Person;Place;Situation Person: Oriented Place: Oriented Situation: Oriented Year: 2020 Month: April("I have been here so long I dont recall exactly") Day of Week: Incorrect(Saturday) Immediate Memory Recall: Sock;Blue;Bed Memory Recall: Sock;Blue;Bed Memory Recall Sock: Without Cue Memory Recall Blue: Without Cue Memory Recall Bed: Without Cue Safety/Judgment: Appears intact Sensation Sensation Light Touch: Appears Intact Hot/Cold: Not tested Proprioception: Appears Intact Stereognosis: Not tested Coordination Gross Motor Movements are Fluid and Coordinated: No Fine Motor Movements are Fluid and Coordinated: No Coordination and Movement Description: BUE & BLE weakness, R hemiplegia Motor  Motor Motor: Abnormal postural alignment and control Motor - Skilled Clinical Observations: B hemiparesis (R>L), generalized deconditioning Mobility    REFER TO PT EVAL - PATIENT DID NOT GET OOB THIS SESSION Trunk/Postural Assessment    REFER TO PT EVAL - PATIENT DID NOT GET OOB THIS SESSION  Balance   REFER TO PT EVAL - PATIENT DID NOT GET OOB THIS SESSION - OT balance goals being set based on pt's status on his evaluation in CIR on 01/31/20  Extremity/Trunk Assessment RUE Assessment Passive Range of Motion (PROM) Comments: WFL, slightly limited in finger flexion due to edema Active Range of Motion (AROM) Comments: limited shoulder ROM of only 20 degrees of flexion, elbow flexion/extension [facility] General Strength Comments: 3-/5 shoulder, 3/5 elbow and grasp RUE Body System: Neuro Brunstrum levels for arm and hand: Arm;Hand Brunstrum level for arm: Stage III Synergy is performed voluntarily Brunstrum level for hand: Stage III Synergies performed  voluntarily LUE Assessment Passive Range of Motion (PROM) Comments: WFL Active Range of Motion (AROM) Comments: limited shoulder ROM, elbow flexion/extension WFL General Strength Comments: 3+ to 4/5     Refer to Care Plan for Long Term Goals  Recommendations for other services: None    Discharge Criteria: Patient will be discharged from OT if patient refuses treatment 3 consecutive times without medical reason, if treatment goals not met, if there is a change in medical status, if patient makes no progress towards goals or if patient is discharged from hospital.  The above assessment, treatment plan, treatment alternatives and goals were discussed and mutually agreed upon: by patient  [facility] 02/13/2019, 12:05 PM

## 2019-02-13 NOTE — Progress Notes (Signed)
Inpatient Rehabilitation  Patient information reviewed and entered into eRehab system by Verle Brillhart M. Erandy Mceachern, M.A., CCC/SLP, PPS Coordinator.  Information including medical coding, functional ability and quality indicators will be reviewed and updated through discharge.    Per nursing patient was given "Data Collection Information Summary" for Patients in Inpatient Rehabilitation Facilities with attached "Privacy Act Statement-Health Care Records" upon admission.   

## 2019-02-13 NOTE — Progress Notes (Signed)
Social Work  Treon Kehl, Lemar Livings, LCSW  Social Worker  Physical Medicine and Rehabilitation  Progress Notes  Signed  Date of Service:  01/31/2019 10:24 AM       Related encounter: Admission (Discharged) from 01/30/2019 in [facility]      Signed        Show:Clear all [x] Manual[x] Template[] Copied  Added by: [x] Xoie Kreuser, Lemar Livings, LCSW  [] Hover for details Social Work  Social Work Assessment and Plan   Patient Details  Name: JAYAN JOHNS MRN: [id number] Date of Birth: 06/19/1951   Today's Date: 01/31/2019   Problem List:      Patient Active Problem List    Diagnosis Date Noted  . Acute bilateral cerebral infarction in a watershed distribution [facility]) 01/30/2019  . UGIB (upper gastrointestinal bleed) 01/26/2019  . Bacteremia due to Streptococcus 01/26/2019  . Cellulitis 01/26/2019  . Upper GI bleed    . Acute CVA (cerebrovascular accident) (HCC) 01/19/2019  . Acute renal failure (HCC) 01/14/2019  . Right shoulder pain 01/14/2019  . Sepsis (HCC) 01/13/2019  . S/P TAVR (transcatheter aortic valve replacement) 07/25/2018  . History of subdural hematoma    . Atrial fibrillation, chronic    . Acute on chronic diastolic heart failure (HCC) 06/15/2018  . Pulmonary hypertension, unspecified (HCC) 02/15/2018  . Varicose veins of right lower extremity with complications 01/10/2018  . History of Severe aortic stenosis 08/09/2017  . Vitamin D deficiency 12/02/2016  . Status post gastric bypass for obesity 11/02/2016  . HTN (hypertension) 04/13/2013  . Hyperlipemia 04/13/2013  . Chronic atrial fibrillation 02/14/2013  . Morbid obesity (HCC) 06/18/2008  . Obstructive sleep apnea 06/18/2008    Past Medical History:      Past Medical History:  Diagnosis Date  . Anemia      low iron  . Arthritis      bilateral knees  . Atrial fibrillation, chronic    . Chronic diastolic CHF (congestive heart failure) (HCC) 06/15/2018  . History of  subdural hematoma    . Hyperlipidemia    . Hypertension    . Morbid obesity (HCC)    . Normal coronary arteries      by cardiac catheterization performed 03/14/06  . Pre-diabetes      pt denies  . S/P TAVR (transcatheter aortic valve replacement)      a. 07/25/18: Edwards Sapien 3 THV (size 26 mm, model # 9600TFX, serial # [id number]) by Dr. Laneta Simmers and Dr. Clifton James  . Sleep apnea      on CPAP  . Venous insufficiency      Past Surgical History:       Past Surgical History:  Procedure Laterality Date  . BIOPSY   01/26/2019    Procedure: BIOPSY;  Surgeon: Bernette Redbird, MD;  Location: [facility] ENDOSCOPY;  Service: Endoscopy;;  . CRANIOTOMY Right 08/27/2016    Procedure: CRANIOTOMY HEMATOMA EVACUATION SUBDURAL;  Surgeon: Maeola Harman, MD;  Location: [facility] OR;  Service: Neurosurgery;  Laterality: Right;  . ESOPHAGOGASTRODUODENOSCOPY N/A 01/21/2019    Procedure: ESOPHAGOGASTRODUODENOSCOPY (EGD);  Surgeon: Vida Rigger, MD;  Location: [facility] ENDOSCOPY;  Service: Endoscopy;  Laterality: N/A;  . ESOPHAGOGASTRODUODENOSCOPY (EGD) WITH PROPOFOL N/A 01/22/2019    Procedure: ESOPHAGOGASTRODUODENOSCOPY (EGD) WITH PROPOFOL;  Surgeon: Bernette Redbird, MD;  Location: [facility] ENDOSCOPY;  Service: Endoscopy;  Laterality: N/A;  . ESOPHAGOGASTRODUODENOSCOPY (EGD) WITH PROPOFOL N/A 01/26/2019    Procedure: ESOPHAGOGASTRODUODENOSCOPY (EGD) WITH PROPOFOL;  Surgeon: Bernette Redbird, MD;  Location: [facility] ENDOSCOPY;  Service: Endoscopy;  Laterality: N/A;  . GASTRIC BYPASS   09/2008  . JOINT REPLACEMENT   08/31/2006    right hip  . MULTIPLE EXTRACTIONS WITH ALVEOLOPLASTY N/A 07/13/2018    Procedure: Extraction of tooth #'s 3,8,10, 23-26, 30and 32 with alveoloplasty and gross debridement of remaining teeth;  Surgeon: Charlynne PanderKulinski, Ronald F, DDS;  Location: [facility] OR;  Service: Oral Surgery;  Laterality: N/A;  . RIGHT HEART CATH N/A 07/06/2018    Procedure: RIGHT HEART CATH;  Surgeon: Kathleene HazelMcAlhany, Christopher D, MD;  Location: [facility] INVASIVE CV LAB;  Service:  Cardiovascular;  Laterality: N/A;  . RIGHT/LEFT HEART CATH AND CORONARY ANGIOGRAPHY N/A 07/10/2018    Procedure: RIGHT/LEFT HEART CATH AND CORONARY ANGIOGRAPHY;  Surgeon: Dolores PattyBensimhon, Daniel R, MD;  Location: [facility] INVASIVE CV LAB;  Service: Cardiovascular;  Laterality: N/A;  . TEE WITHOUT CARDIOVERSION N/A 07/25/2018    Procedure: TRANSESOPHAGEAL ECHOCARDIOGRAM (TEE);  Surgeon: Kathleene HazelMcAlhany, Christopher D, MD;  [facility];  Service: Open Heart Surgery;  Laterality: N/A;  . TEE WITHOUT CARDIOVERSION N/A 01/18/2019    Procedure: TRANSESOPHAGEAL ECHOCARDIOGRAM (TEE) WITH PROPOFOL;  Surgeon: Jonelle SidleMcDowell, Samuel G, MD;  Location: [facility] ORS;  Service: Cardiovascular;  Laterality: N/A;  . TOTAL HIP ARTHROPLASTY        right hip  . TRANSCATHETER AORTIC VALVE REPLACEMENT, TRANSFEMORAL   07/25/2018  . TRANSCATHETER AORTIC VALVE REPLACEMENT, TRANSFEMORAL N/A 07/25/2018    Procedure: TRANSCATHETER AORTIC VALVE REPLACEMENT, TRANSFEMORAL;  Surgeon: Kathleene HazelMcAlhany, Christopher D, MD;  Location: [facility] OR;  Service: Open Heart Surgery;  Laterality: N/A;    Social History:  reports that he has quit smoking. He has never used smokeless tobacco. He reports that he does not drink alcohol or use drugs.   Family / Support Systems Marital Status: Married Patient Roles: Spouse, Parent, Other (Comment)(minister/preacher) Spouse/Significant Other: Delaney MeigsJackie [phone]  [phone] Children: Son and daughter both local Other Supports: friends and church members Anticipated Caregiver: Wife Ability/Limitations of Caregiver: Wife is in good health and can assist Caregiver Availability: 24/7 Family Dynamics: Close knit family both children are involved and supportive. He has many supports via church members and community members. He is planning on getting to independent level and not need assist.   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 Wife plans to be here daily to provide support.    Abuse/Neglect Abuse/Neglect Assessment Can Be Completed: Yes Physical Abuse: Denies Verbal Abuse: Denies Sexual Abuse: Denies Exploitation of patient/patient's resources: Denies Self-Neglect: Denies   Emotional Status Pt's affect, behavior and adjustment status: Pt is motivated and plans to be mod/i before leaving rehab. He has always been one to be able to care for himself and others. He does not want to burden his wife and will do all that he can to recover from this stroke. Recent Psychosocial Issues: other health issues was admitted to the hospital for what he thought was the flu and all this happened while here Psychiatric History: No history deferred depression screen due to coping appropriately and relies upon his faith to pull him through. Will monitor and see if would benefit from seeing neuro-psych while here due to his position needing to always feel upbeat and  positive. Substance Abuse History: No issues   Patient / Family Perceptions, Expectations & Goals Pt/Family understanding of illness & functional limitations: Pt and wife have a good understanding of his stroke and deficits, he talks with the MD daily and feels his questions are answered and plan for treatment going forward. He is not one who doesn't say what is on his mind. Premorbid pt/family roles/activities: Husband, father, grandfather, Programmer, multimedia, retiree, etc Anticipated changes in roles/activities/participation: resume Pt/family expectations/goals: Pt states: " I plan on being independent when I leave here, I don't want to be a burden to my wife." Wife  states: " I will do whatever he needs, but hope he will be doing well on his own."   [employer]: None Premorbid Home Care/DME Agencies: Other (Comment)(rw, bsc, wc, power chair, etc) Transportation available at discharge: Both he and wife drive Resource referrals recommended: Support group (specify)   Discharge Planning Living Arrangements: Spouse/significant other Support Systems: Spouse/significant other, Children, Other relatives, Friends/neighbors, Church/faith community Type of Residence: Private residence Insurance Resources: Media planner (specify)(UHC-Medicare) Financial Resources: Social Security, Employment Financial Screen Referred: No Living Expenses: Own Money Management: Spouse, Patient Does the patient have any problems obtaining your medications?: No Home Management: Wife, pt reports he does all of the cooking Patient/Family Preliminary Plans: Return home with wife who is able to assist and is in good health. Pt is hopeful he will do well and not need his wife to assist at discharge. Will await team evaluations and work on discharge needs.  Social Work Anticipated Follow Up Needs: HH/OP, Support Group   Clinical Impression Pleasant gentleman who is motivated to do well and recover from his stroke. He is starting to get movement back which is encouraging to him. His wife and children are involved and supportive. Will await team's evaluations and work on discharge needs. Will ask input from team regarding neuro-psych seeing while here.   Lucy Chris 01/31/2019, 10:24 AM            Patient ID: [name], male   DOB: Apr 20, 1951, 68 y.o.   MRN: [id number]

## 2019-02-13 NOTE — Progress Notes (Signed)
°Swartz, Zachary T, MD  °Physician  °Physical Medicine and Rehabilitation  °Consult Note  °Signed  °Date of Service:  01/25/2019  8:10 AM  °  °  ° Related encounter: ED to Hosp-Admission (Discharged) from 01/13/2019 in [facility]  °  °  °Signed    °  °Expand All Collapse All   ° °Show:Clear all °[x]Manual[x]Template[]Copied ° °Added by: °[x]Angiulli, Daniel J, PA-C[x]Swartz, Zachary T, MD ° °[]Hover for details ° °  °  °  °  °Physical Medicine and Rehabilitation Consult °Reason for Consult: Right side weakness °Referring Physician: critical care °  °  °HPI: Joel Rogers is a 68 y.o.right handed male with history of diastolic congestive heart failure, aortic stenosis status post TAVR 07/2018, atrial fibrillation maintained on Eliquis, hypertension. Per chart review patient lives with spouse. Community ambulator. Active and driving. One level home 3 steps to entry.  Patient presented to [facility] 01/13/2019 secondary to right and left upper extremity pain findings of sepsis due to Streptococcus bacteremia and cellulitis as well as recent fall. During admission maintain on antibiotic therapy patient with persistent right side weakness. MRI the brain revealed multiple acute ischemic infarctions in a pattern most consistent with cardioembolic stroke. Patient was transferred to [facility] for further evaluation. Venous Doppler study showed no signs of DVT. Findings of elevated liver function studies ultrasound the abdomen showed no focal lesion identified. MRI the brain with numerous scattered foci of acute ischemia throughout the cerebrum and cerebellum. Many of these lesions suggestive of embolic infarction. No midline shift or mass effect. No proximal intracranial arterial occlusion. Echocardiogram with ejection fraction of 60%. Normal systolic function. Patient remained on Eliquis for history of atrial fibrillation with the addition of aspirin for CVA. Patient developed  coffee-ground emesis with acute blood loss anemia requiring transfusions 2 units pack red blood cells and gastroenterology services consulted EGD completed ×2 with findings of red blood in the lower third of the esophagus clotted blood in the entire stomach. Blood thinners currently remained on hold patient is NPO and await plan for possible follow-up endoscopy. Therapy evaluations have been completed with recommendations of physical medicine rehabilitation consult. °  °  °Review of Systems  °Constitutional: Positive for fever. Negative for chills.  °HENT: Negative for hearing loss.   °Eyes: Negative for blurred vision and double vision.  °Respiratory: Positive for shortness of breath. Negative for cough.   °Cardiovascular: Positive for palpitations and leg swelling.  °Gastrointestinal: Positive for abdominal pain, constipation and nausea.  °Genitourinary: Negative for dysuria, flank pain, hematuria and urgency.  °Musculoskeletal: Positive for falls, joint pain and myalgias.  °Skin: Negative for rash.  °Neurological: Positive for dizziness, weakness and headaches. Negative for speech change and seizures.  °Psychiatric/Behavioral: The patient has insomnia.   °All other systems reviewed and are negative. °  °    °Past Medical History:  °Diagnosis Date  °• Anemia    °  low iron  °• Arthritis    °  bilateral knees  °• Atrial fibrillation, chronic    °• Chronic diastolic CHF (congestive heart failure) (HCC) 06/15/2018  °• History of subdural hematoma    °• Hyperlipidemia    °• Hypertension    °• Morbid obesity (HCC)    °• Normal coronary arteries    °  by cardiac catheterization performed 03/14/06  °• Pre-diabetes    °  pt denies  °• S/P TAVR (transcatheter aortic valve replacement)    °  a. 07/25/18: Edwards Sapien 3 THV (size 26 mm, model # 9600TFX, serial # [id number]) by Dr. Bartle and Dr. McAlhany  °• Sleep   apnea    °  on CPAP  °• Venous insufficiency    °  °     °Past Surgical History:  °Procedure Laterality Date  °•  CRANIOTOMY Right 08/27/2016  °  Procedure: CRANIOTOMY HEMATOMA EVACUATION SUBDURAL;  Surgeon: Joseph Stern, MD;  Location: [facility] OR;  Service: Neurosurgery;  Laterality: Right;  °• ESOPHAGOGASTRODUODENOSCOPY N/A 01/21/2019  °  Procedure: ESOPHAGOGASTRODUODENOSCOPY (EGD);  Surgeon: Magod, Marc, MD;  Location: [facility] ENDOSCOPY;  Service: Endoscopy;  Laterality: N/A;  °• ESOPHAGOGASTRODUODENOSCOPY (EGD) WITH PROPOFOL N/A 01/22/2019  °  Procedure: ESOPHAGOGASTRODUODENOSCOPY (EGD) WITH PROPOFOL;  Surgeon: Buccini, Robert, MD;  Location: [facility] ENDOSCOPY;  Service: Endoscopy;  Laterality: N/A;  °• GASTRIC BYPASS   09/2008  °• JOINT REPLACEMENT   08/31/2006  °  right hip  °• MULTIPLE EXTRACTIONS WITH ALVEOLOPLASTY N/A 07/13/2018  °  Procedure: Extraction of tooth #'s 3,8,10, 23-26, 30and 32 with alveoloplasty and gross debridement of remaining teeth;  Surgeon: Kulinski, Ronald F, DDS;  Location: [facility] OR;  Service: Oral Surgery;  Laterality: N/A;  °• RIGHT HEART CATH N/A 07/06/2018  °  Procedure: RIGHT HEART CATH;  Surgeon: McAlhany, Christopher D, MD;  Location: [facility] IN[facility];  Service: Cardiovascular;  Laterality: N/A;  °• RIGHT/LEFT HEART CATH AND CORONARY ANGIOGRAPHY N/A 07/10/2018  °  Procedure: RIGHT/LEFT HEART CATH AND CORONARY ANGIOGRAPHY;  Surgeon: Bensimhon, Daniel R, MD;  [facility];  Service: Cardiovascular;  Laterality: N/A;  °• TEE WITHOUT CARDIOVERSION N/A 07/25/2018  °  Procedure: TRANSESOPHAGEAL ECHOCARDIOGRAM (TEE);  Surgeon: McAlhany, Christopher D, MD;  Location: [facility];  Service: Open Heart Surgery;  Laterality: N/A;  °• TEE WITHOUT CARDIOVERSION N/A 01/18/2019  °  Procedure: TRANSESOPHAGEAL ECHOCARDIOGRAM (TEE) WITH PROPOFOL;  Surgeon: McDowell, Samuel G, MD;  Location: [facility];  Service: Cardiovascular;  Laterality: N/A;  °• TOTAL HIP ARTHROPLASTY      °  right hip  °• TRANSCATHETER AORTIC VALVE REPLACEMENT, TRANSFEMORAL   07/25/2018  °• TRANSCATHETER AORTIC VALVE REPLACEMENT, TRANSFEMORAL N/A 07/25/2018    °  Procedure: TRANSCATHETER AORTIC VALVE REPLACEMENT, TRANSFEMORAL;  Surgeon: McAlhany, Christopher D, MD;  Location: [facility];  Service: Open Heart Surgery;  Laterality: N/A;  °  °     °Family History  °Problem Relation Age of Onset  °• Hypertension Father    °• Cancer Father    °      prob prostate  °• Alzheimer's disease Mother    °• Alzheimer's disease Maternal Grandfather    °• Breast cancer Sister    °• Cancer Brother    °      "brain tumor"  °  °Social History:  reports that he has quit smoking. He has never used smokeless tobacco. He reports that he does not drink alcohol or use drugs. °Allergies: No Known Allergies °      °Medications Prior to Admission  °Medication Sig Dispense Refill  °• acetaminophen (TYLENOL) 650 MG CR tablet Take 650 mg by mouth 2 (two) times daily as needed for pain.      °• amoxicillin (AMOXIL) 500 MG capsule Take 2,000 mg (4 tablets) one hour prior to dental visits. 8 capsule 3  °• apixaban (ELIQUIS) 5 MG TABS tablet Take 1 tablet (5 mg total) by mouth 2 (two) times daily. 180 tablet 1  °• aspirin 81 MG chewable tablet Chew 1 tablet (81 mg total) by mouth daily.      °• atorvastatin (LIPITOR) 80 MG tablet TAKE 1 TABLET BY MOUTH ONCE DAILY WITH  BREAKFAST 90 tablet 1  °• colchicine 0.6 MG tablet TAKE 1 TABLET BY MOUTH TWICE DAILY 60 tablet 2  °• diltiazem (CARDIZEM CD) 120 MG 24 hr capsule Take 1   capsule (120 mg total) by mouth daily. 90 capsule 1  °• ergocalciferol (VITAMIN D2) 1.25 MG (50000 UT) capsule Take 50,000 Units by mouth once a week.      °• spironolactone (ALDACTONE) 25 MG tablet Take 0.5 tablets (12.5 mg total) by mouth daily. 30 tablet 5  °• torsemide (DEMADEX) 20 MG tablet Take 40 mg by mouth as needed. Use as needed if weight is over 245 pounds.      °  °  °Home: °Home Living °Family/patient expects to be discharged to:: Private residence °Living Arrangements: Spouse/significant other °Available Help at Discharge: Family, Available 24 hours/day °Type of Home:  House °Home Access: Stairs to enter °Entrance Stairs-Number of Steps: 3 °Entrance Stairs-Rails: Right °Home Layout: One level °Bathroom Shower/Tub: Tub/shower unit, Walk-in shower °Bathroom Toilet: Handicapped height °Home Equipment: Walker - 2 wheels, Cane - single point ° Lives With: Spouse  °Functional History: °Prior Function °Level of Independence: Independent °Comments: community ambulator, drives, was actively going to the [employer] and using the pool there °Functional Status:  °Mobility: °Bed Mobility °Overal bed mobility: Needs Assistance °Bed Mobility: Supine to Sit, Sit to Supine °Supine to sit: Max assist, +2 for physical assistance, +2 for safety/equipment, HOB elevated °Sit to supine: +2 for physical assistance, Max assist °General bed mobility comments: increased time and effort, heavy assistance needed for R LE movement off of bed, heavy assistance with trunk management °Transfers °Overall transfer level: Needs assistance °Equipment used: Rolling walker (2 wheeled) °Transfer via Lift Equipment: Maxisky °Transfers: Sit to/from Stand, Stand Pivot Transfers °Sit to Stand: Mod assist, Max assist °Stand pivot transfers: Mod assist, Max assist °General transfer comment: mod to max to come to sitting once sitting can hold himself on EOB without external support. using LUE to hold onto rail. worked with balance and R UE +R LE exercises EOB °Ambulation/Gait °Ambulation/Gait assistance: Max assist °Gait Distance (Feet): 4 Feet °Assistive device: Rolling walker (2 wheeled) °Gait Pattern/deviations: Decreased step length - right, Decreased step length - left, Decreased stride length °General Gait Details: limited to 7-8 slow labored side steps due to BLE weakness °Gait velocity: slow °  °ADL: °ADL °Overall ADL's : Needs assistance/impaired °Eating/Feeding: NPO °Eating/Feeding Details (indicate cue type and reason): except ice chips, setup to place cup in hand °Grooming: Min guard, Set up, Bed level, Wash/dry face,  Sitting °Grooming Details (indicate cue type and reason): pt with assist to apply lotion to back °Upper Body Bathing: Minimal assistance, Sitting °Lower Body Bathing: Maximal assistance, +2 for physical assistance, +2 for safety/equipment, Sitting/lateral leans °Upper Body Dressing : Moderate assistance, Sitting °Lower Body Dressing: +2 for physical assistance, +2 for safety/equipment, Sitting/lateral leans, Maximal assistance, Total assistance °Lower Body Dressing Details (indicate cue type and reason): assist to don socks at bed level °Toileting- Clothing Manipulation and Hygiene: Total assistance, +2 for physical assistance, +2 for safety/equipment, Bed level °Functional mobility during ADLs: Maximal assistance, Total assistance, +2 for physical assistance, +2 for safety/equipment °General ADL Comments: pt able to sit EOB today and then use of maxisky for OOB to chair today, continues to have increased RLE pain (mostly in hip region), also with some limitations due to nausea °  °Cognition: °Cognition °Overall Cognitive Status: Impaired/Different from baseline °Arousal/Alertness: Awake/alert °Orientation Level: Oriented X4 °Attention: Selective °Selective Attention: Impaired °Selective Attention Impairment: Verbal basic °Memory: Impaired °Memory Impairment: Storage deficit, Retrieval deficit, Decreased recall of new information °Awareness: Impaired °Awareness Impairment: Anticipatory impairment °Cognition °Arousal/Alertness: Awake/alert °Behavior During Therapy: WFL for tasks assessed/performed °Overall Cognitive Status: Impaired/Different from baseline °General Comments: pt is very   sweet, pleasant and motivated to work with therapy °  °Blood pressure (!) 147/78, pulse (!) 52, temperature 98 °F (36.7 °C), temperature source Oral, resp. rate 12, height 6' 1" (1.854 m), weight 128.1 kg, SpO2 99 %. °Physical Exam  °Constitutional: He appears well-developed. No distress.  °HENT:  °Head: Normocephalic.  °Eyes: Pupils  are equal, round, and reactive to light.  °Neck: Normal range of motion.  °Cardiovascular: Normal rate.  °Respiratory: Effort normal.  °GI: Soft.  °Musculoskeletal:     °   General: No edema.  °Neurological:  °Patient is alert. He makes good eye contact with examiner. Follows full commands. He provides his name and age as well as date of birth. RUE and RLE 3+ to 4/5 with decreased [facility]. Senses pain.   °Skin: Skin is warm.  °Psychiatric:  °Delays in processing. Fair insight  °  °  °Lab Results Last 24 Hours  °     °Results for orders placed or performed during the hospital encounter of 01/13/19 (from the past 24 hour(s))  °Glucose, capillary     Status: Abnormal  °  Collection Time: 01/24/19  7:28 PM  °Result Value Ref Range  °  Glucose-Capillary 116 (H) 70 - 99 mg/dL  °Comprehensive metabolic panel     Status: Abnormal  °  Collection Time: 01/25/19  4:18 AM  °Result Value Ref Range  °  Sodium 147 (H) 135 - 145 mmol/L  °  Potassium 3.8 3.5 - 5.1 mmol/L  °  Chloride 118 (H) 98 - 111 mmol/L  °  CO2 21 (L) 22 - 32 mmol/L  °  Glucose, Bld 116 (H) 70 - 99 mg/dL  °  BUN 44 (H) 8 - 23 mg/dL  °  Creatinine, Ser 1.50 (H) 0.61 - 1.24 mg/dL  °  Calcium 8.1 (L) 8.9 - 10.3 mg/dL  °  Total Protein 6.4 (L) 6.5 - 8.1 g/dL  °  Albumin 2.1 (L) 3.5 - 5.0 g/dL  °  AST 22 15 - 41 U/L  °  ALT 7 0 - 44 U/L  °  Alkaline Phosphatase 72 38 - 126 U/L  °  Total Bilirubin 1.3 (H) 0.3 - 1.2 mg/dL  °  GFR calc non Af Amer 47 (L) >60 mL/min  °  GFR calc Af Amer 55 (L) >60 mL/min  °  Anion gap 8 5 - 15  °Magnesium     Status: Abnormal  °  Collection Time: 01/25/19  4:18 AM  °Result Value Ref Range  °  Magnesium 2.6 (H) 1.7 - 2.4 mg/dL  °CBC with Differential/Platelet     Status: Abnormal  °  Collection Time: 01/25/19  4:18 AM  °Result Value Ref Range  °  WBC 16.8 (H) 4.0 - 10.5 K/uL  °  RBC 3.28 (L) 4.22 - 5.81 MIL/uL  °  Hemoglobin 8.8 (L) 13.0 - 17.0 g/dL  °  HCT 27.2 (L) 39.0 - 52.0 %  °  MCV 82.9 80.0 - 100.0 fL  °  MCH 26.8 26.0 - 34.0 pg  °   MCHC 32.4 30.0 - 36.0 g/dL  °  RDW 22.0 (H) 11.5 - 15.5 %  °  Platelets 145 (L) 150 - 400 K/uL  °  nRBC 1.1 (H) 0.0 - 0.2 %  °  Neutrophils Relative % 91 %  °  Neutro Abs 15.3 (H) 1.7 - 7.7 K/uL  °  Lymphocytes Relative 5 %  °  Lymphs Abs 0.8 0.7 - 4.0 K/uL  °  Monocytes Relative 2 %  °  Monocytes Absolute 0.3 0.1 - 1.0 K/uL  °  Eosinophils Relative 0 %  °  Eosinophils Absolute 0.0 0.0 - 0.5 K/uL  °  Basophils Relative 0 %  °  Basophils Absolute 0.0 0.0 - 0.1 K/uL  °    nRBC 0 0 /100 WBC  °  Promyelocytes Relative 2 %  °  Abs Immature Granulocytes 0.30 (H) 0.00 - 0.07 K/uL  °  Burr Cells PRESENT    °  Polychromasia PRESENT    °  Target Cells PRESENT    °Phosphorus     Status: None  °  Collection Time: 01/25/19  4:18 AM  °Result Value Ref Range  °  Phosphorus 3.1 2.5 - 4.6 mg/dL  °  °  ° °Imaging Results (Last 48 hours)  °Us Renal °  °Result Date: 01/24/2019 °CLINICAL DATA:  Acute renal injury EXAM: RENAL / URINARY TRACT ULTRASOUND COMPLETE COMPARISON:  CT from 07/11/2008 FINDINGS: Right Kidney: Renal measurements: 12.7 x 6.1 x 5.7 cm. = volume: 236 mL. Lobulations are noted consistent with focal scarring seen on prior CT examination. No mass lesion or hydronephrosis is noted. Left Kidney: Renal measurements: 13.2 x 6.3 x 6.1 cm = volume: 272 mL. Echogenicity within normal limits. No mass or hydronephrosis visualized. Bladder: Appears normal for degree of bladder distention. IMPRESSION: Changes of scarring within the right kidney. No acute abnormality is noted. Electronically Signed   By: Mark  Lukens M.D.   On: 01/24/2019 07:15   °  °  °  °Assessment/Plan: °Diagnosis: 68 yo male admitted with sepsis who suffered subsequent cardio-embolic bi-cerebral infarcts °1. Does the need for close, 24 hr/day medical supervision in concert with the patient's rehab needs make it unreasonable for this patient to be served in a less intensive setting? Yes °2. Co-Morbidities requiring supervision/potential complications: sepsis, ID  considerations, UGIB, atrial fib °3. Due to bladder management, bowel management, safety, skin/wound care, disease management and medication administration, does the patient require 24 hr/day rehab nursing? Yes °4. Does the patient require coordinated care of a physician, rehab nurse, PT (1-2 hrs/day, 5 days/week), OT (1-2 hrs/day, 5 days/week) and SLP (1-2 hrs/day, 5 days/week) to address physical and functional deficits in the context of the above medical diagnosis(es)? Yes °Addressing deficits in the following areas: balance, endurance, locomotion, strength, transferring, bowel/bladder control, bathing, dressing, feeding, grooming, toileting, cognition, speech and psychosocial support °5. Can the patient actively participate in an intensive therapy program of at least 3 hrs of therapy per day at least 5 days per week? Yes °6. The potential for patient to make measurable gains while on inpatient rehab is excellent °7. Anticipated functional outcomes upon discharge from inpatient rehab are supervision and min assist  with PT, supervision and min assist with OT, modified independent and supervision with SLP. °8. Estimated rehab length of stay to reach the above functional goals is: 12-16 days °9. Anticipated D/C setting: Home °10. Anticipated post D/C treatments: HH therapy °11. Overall Rehab/Functional Prognosis: excellent °  °RECOMMENDATIONS: °This patient's condition is appropriate for continued rehabilitative care in the following setting: CIR °Patient has agreed to participate in recommended program. Yes °Note that insurance prior authorization may be required for reimbursement for recommended care. °  °Comment: Rehab Admissions Coordinator to follow up. °  °Thanks, °  °Zachary T. Swartz, MD, FAAPMR °  °I have personally performed a face to face diagnostic evaluation of this patient. Additionally, I have examined pertinent labs and radiographic images. I have reviewed and concur with the physician assistant's  documentation above.   °  °Daniel J Angiulli, PA-C °01/25/2019  °  °  °  °Revision History   ° °     °     °     °     °Routing History   ° °     °     ° °

## 2019-02-13 NOTE — Care Management Note (Signed)
[facility] Individual Statement of Services  Patient Name:  RHAMEL BOULEY  Date:  02/13/2019  Welcome to the [facility].  Our goal is to provide you with an individualized program based on your diagnosis and situation, designed to meet your specific needs.  With this comprehensive rehabilitation program, you will be expected to participate in at least 3 hours of rehabilitation therapies Monday-Friday, with modified therapy programming on the weekends.  Your rehabilitation program will include the following services:  Physical Therapy (PT), Occupational Therapy (OT), Speech Therapy (ST), 24 hour per day rehabilitation nursing, Therapeutic Recreaction (TR), Neuropsychology, Case Management (Social Worker), Rehabilitation Medicine, Nutrition Services and Pharmacy Services  Weekly team conferences will be held on Wednesday to discuss your progress.  Your Social Worker will talk with you frequently to get your input and to update you on team discussions.  Team conferences with you and your family in attendance may also be held.  Expected length of stay: 3-4 weeks  Overall anticipated outcome: min assist wheelchair level no ambulation goals at this time  Depending on your progress and recovery, your program may change. Your Social Worker will coordinate services and will keep you informed of any changes. Your Social Worker's name and contact numbers are listed  below.  The following services may also be recommended but are not provided by the [facility]patient [facility]:   Driving Evaluations  Home Health Rehabiltiation Services  Outpatient Rehabilitation Services  Vocational Rehabilitation   Arrangements will be made to provide these services after discharge if needed.  Arrangements include referral to agencies that provide these services.  Your insurance has been verified to be:  [employer]-Medicare Your primary doctor is:  [employer]  Pertinent  information will be shared with your doctor and your insurance company.  Social Worker:  Dossie Der, SW [phone] or ([phone]  Information discussed with and copy given to patient by: Lucy Chris, 02/13/2019, 10:53 AM

## 2019-02-13 NOTE — Progress Notes (Signed)
Physical Therapy Session Note  Patient Details  Name: Joel Rogers MRN: [id number] Date of Birth: 29-Oct-1951  Today's Date: 02/13/2019 PT Individual Time: 0940-7680 PT Individual Time Calculation (min): 74 min   Short Term Goals: Week 1:  PT Short Term Goal 1 (Week 1): Pt will transfer bed<>w/c with max assist +1 using LRAD. PT Short Term Goal 2 (Week 1): Pt will complete bed mobility with mod assist +1. PT Short Term Goal 3 (Week 1): Pt will propel w/c 25 ft with min assist.   Skilled Therapeutic Interventions/Progress Updates: Pt presented in bed sleeping but easily aroused and agreeable to therapy. Pt stating pain in chest area 6/10 but refusing pain meds. Pt participated in R NMR via forced use in supine including AA heel slides, AA SLR, and ankle pumps. PTA performed manual stretching  Provided extensive pt edu regarding pain management in benefit to progression of therapy due to increased pain with rolling due to rib fx. Pt verbalized understanding. Pt agreeable to try STS in stedy. Performed supine to sit EOB with maxA x 2 with pt moving legs to L and receiving +2 assist for truncal support minimizing pulling with UE. Pt was able to maintain static sitting at EOB with CGA and multimodal cues to decrease L lateral lean. Pt performed STS to [facility] with bed elevated and modA x2. Pt was able to come to stand from [place] with modA x 2. Pt returned to EOB and performed sit to supine maxA x 2 with total A for BLE management. Pt repositioned to comfort and left with bed alarm on, call bell within reach and needs met.   Therapy Documentation Precautions:  Precautions Precautions: Fall Restrictions Weight Bearing Restrictions: No General:   Vital Signs: Therapy Vitals Temp: 98.1 F (36.7 C) Pulse Rate: 77 Resp: 19 BP: (!) 137/102 Patient Position (if appropriate): Lying Oxygen Therapy SpO2: 100 % O2 Device: Room Air    Therapy/Group: Individual Therapy  Xavien Dauphinais  Payson Crumby, PTA  02/13/2019, 3:55 PM

## 2019-02-13 NOTE — Progress Notes (Signed)
Joel Diones, RN  Rehab Admission Coordinator  Physical Medicine and Rehabilitation  PMR Pre-admission  Signed  Date of Service:  02/12/2019 12:22 PM       Related encounter: Admission (Discharged) from 02/03/2019 in [facility]      Signed         Show:Clear all _0 Manual_1 Template_2 Copied  Added by: _3 Joel Diones, RN  _4 Hover for details PMR Admission Coordinator Pre-Admission Assessment  Patient: Joel Rogers is an 68 y.o., male MRN: [id number] DOB: 10-02-1951 Height: _5  (185.4 cm) Weight: 117 kg                                                                                                                                                  Insurance Information HMO: Yes    PPO:       PCP:       IPA:       80/20:       OTHER:   PRIMARY: U[employer] Medicare      Policy#: [id number]      Subscriber: patient CM Name: Joel Rogers      Phone#:      Fax#: [phone] Pre-Cert#: [id number] for 7 days with follow up to Joel Rogers       Employer: Retired Benefits:  Phone #: [phone]     Name: on line portal Eff. Date: 11/22/18     Deduct: $0      Out of Pocket Max: $3600 (met $30)      Life Max: N/A CIR: $295 days 1-5      SNF: $0 days 1-20; $160 days 21-43; $0 days 44-100 Outpatient: medical necessity     Co-Pay: $30/visit Home Health: 100%      Co-Pay: none DME: 80%     Co-Pay: 20% Providers: in network  Medicaid Application Date:       Case Manager:  Disability Application Date:       Case Worker:   Emergency [employer] copy Information    Name Relation Home Work Mobile   [place] Spouse [phone]  [phone]     Current Medical History  Patient Admitting Diagnosis:  Multiple infarcts, post cardiac arrest, MI  History of Present Illness:  A 69 year old right-handed male with history of diastolic congestive heart failure, aortic stenosis with TAVR procedure September 2019,atrial fibrillation  maintained on Eliquis, OSA with CPAP, hypertension. Per chart review patient lives with spouse community ambulator and active prior to admission. Initially presented to [facility] 01/13/2019 secondary to right upper extremity pain and recent fall with right sided weakness findings of sepsis due to Staphylococcus bacteremia and cellulitis during admission maintain on antibiotic therapy. Noted persistent right side weakness. MRI the brain revealed multiple acute ischemic infarcts in a pattern most consistent with  cardioembolic stroke. He was transferred to [facility] for further evaluation. Venous Doppler studies negative for DVTs. Follow-up MRI the brain with numerous scattered foci of acute ischemia throughout the cerebrum and cerebellum. No midline shift or mass effect. No proximal intracranial arterial occlusion. Echocardiogram with ejection fraction of 60% and normal systolic function. TEE showed no vegetation on bioprosthetic aortic valve. Patient remained on eliquis for atrial fibrillation with the addition of aspirin for CVA prophylaxis. He developed coffee-ground emesis with acute blood loss anemia requiring transfusion totaling 6 units pack red blood cells with gastroenterology consulted for EGD completed 2 with findings of red blood in the lower third of the esophagus clotted blood in the entire stomach. Blood thinners were held and follow-up GI endoscopy again completed 01/26/2019 showing no active bleeding or blood present. Recommendations were to hold aspirin and eliquis for approximately 3-5 days and then resume. He was placed on Protonix twice daily.hospital course complicated by AKI with creatinine baseline 1.1-1.23 elevated to 1.95 renal services consulted likely secondary to ischemic ATN in the setting of sepsis maintained on gentle IV fluids creatinine improved to 1.00. Therapy evaluations completed patient was admitted for a comprehensive rehabilitation program 01/30/2019. On  02/03/2019 patient with burning chest pain felt like acid reflux he did receive a dose of Maalox. Denied shortness of breath. Patient became unresponsive and code was called and noted to be in ventricular fibrillation. Troponin was noted to be elevated. He was discharged to acute care services 02/03/2019 maintain on intravenous heparin and followed by cardiology services. Echocardiogram was repeated with ejection fraction greater than 65% hyperdynamic systolic function.cardiac catheterization same day showed Lat Ramus lesion 99% stenosed. Heavy thrombus. No other significant CAD. His Eliquis was resumed and monitor for any bleeding episodes. Nasogastric tube initially in place and diet has been advanced to regular consistency. Therapies have been resumed patient tolerating nicely and was readmitted back to inpatient rehabilitation services.  Past Medical History      Past Medical History:  Diagnosis Date  . Anemia    low iron  . Arthritis    bilateral knees  . Atrial fibrillation, chronic   . Chronic diastolic CHF (congestive heart failure) ([place]) 06/15/2018  . History of subdural hematoma   . Hyperlipidemia   . Hypertension   . Morbid obesity ([place])   . Normal coronary arteries    by cardiac catheterization performed 03/14/06  . Pre-diabetes    pt denies  . S/P TAVR (transcatheter aortic valve replacement)    a. 07/25/18: Edwards Sapien 3 THV (size 26 mm, model # 9600TFX, serial # [id number]) by Dr. Cyndia Bent and Dr. Angelena Form  . Sleep apnea    on CPAP  . Venous insufficiency   . VF (ventricular fibrillation) ([place]) causing cardiac arrest 02/03/2019    Family History  family history includes Alzheimer's disease in his maternal grandfather and mother; Breast cancer in his sister; Cancer in his brother and father; Hypertension in his father.  Prior Rehab/Hospitalizations: Was on CIR 01/30/19 to 02/03/19, then to acute hospital after cardiac arrest/MI  Has the patient had  major surgery during 100 days prior to admission? No  Current Medications   Current Facility-Administered Medications:  .  amLODipine (NORVASC) tablet 5 mg, 5 mg, Oral, Daily, Sherren Mocha, MD, 5 mg at 02/12/19 1003 .  apixaban (ELIQUIS) tablet 5 mg, 5 mg, Oral, BID, Camnitz, Will Hassell Done, MD, 5 mg at 02/12/19 1004 .  atorvastatin (LIPITOR) tablet 80 mg, 80 mg, Oral, q1800, Sherren Mocha, MD,  80 mg at 02/11/19 1658 .  chlorhexidine (PERIDEX) 0.12 % solution 15 mL, 15 mL, Mouth Rinse, BID, Sherren Mocha, MD, 15 mL at 02/12/19 1002 .  feeding supplement (OSMOLITE 1.5 CAL) liquid 1,000 mL, 1,000 mL, Per Tube, Continuous, Sherren Mocha, MD, Stopped at 02/10/19 1150 .  feeding supplement (PRO-STAT SUGAR FREE 64) liquid 30 mL, 30 mL, Oral, BID, Sherren Mocha, MD, 30 mL at 02/12/19 1002 .  [START ON 02/13/2019] furosemide (LASIX) tablet 40 mg, 40 mg, Oral, Daily, Barrett, Rhonda G, PA-C .  lip balm (CARMEX) ointment, , Topical, BID, Sherren Mocha, MD .  MEDLINE mouth rinse, 15 mL, Mouth Rinse, BID, Sherren Mocha, MD, 15 mL at 02/11/19 2212 .  MEDLINE mouth rinse, 15 mL, Mouth Rinse, q12n4p, Sherren Mocha, MD, 15 mL at 02/10/19 1414 .  metoprolol tartrate (LOPRESSOR) tablet 50 mg, 50 mg, Oral, BID, Sherren Mocha, MD, 50 mg at 02/12/19 1003 .  morphine 2 MG/ML injection 2 mg, 2 mg, Intravenous, Q2H PRN, Rudean Curt, MD, 2 mg at 02/08/19 2214 .  nitroGLYCERIN (NITROSTAT) SL tablet 0.4 mg, 0.4 mg, Sublingual, Q5 Min x 3 PRN, Sherren Mocha, MD .  ondansetron [facility]) injection 4 mg, 4 mg, Intravenous, Q6H PRN, Sherren Mocha, MD, 4 mg at 02/05/19 1909 .  pantoprazole (PROTONIX) EC tablet 40 mg, 40 mg, Oral, BID, Camnitz, Will Hassell Done, MD, 40 mg at 02/12/19 1003 .  potassium chloride SA (K-DUR,KLOR-CON) CR tablet 40 mEq, 40 mEq, Oral, Once, Barrett, Rhonda G, PA-C .  sodium chloride flush (NS) 0.9 % injection 10-40 mL, 10-40 mL, Intracatheter, PRN, Sherren Mocha, MD, 10 mL  at 02/12/19 1033 .  traMADol-acetaminophen (ULTRACET) 37.5-325 MG per tablet 1-2 tablet, 1-2 tablet, Oral, Q6H PRN, Sherren Mocha, MD, 1 tablet at 02/12/19 4174  Patients Current Diet:     Diet Order                  Diet - low sodium heart healthy         Diet regular Room service appropriate? Yes; Fluid consistency: Thin  Diet effective now               Precautions / Restrictions Precautions Precautions: Fall Precaution Comments: cortrak, NG tube Restrictions Weight Bearing Restrictions: No   Has the patient had 2 or more falls or a fall with injury in the past year?No.  Suffered 1 fall PTA, no injury.  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 Walker and SPC  Prior Functional Level Prior Function Level of Independence: Independent Comments: community ambulator, drives, was actively going to [employer] and using the pool there. This was prior to Feb 2020 admission.  Self Care: Did the patient need help bathing, dressing, using the toilet or eating?  Independent  Indoor Mobility: Did the patient need assistance with walking from room to room (with or without device)? Independent  Stairs: Did the patient need assistance with internal or external stairs (with or without device)? Independent  Functional Cognition: Did the patient need help planning regular tasks such as shopping or remembering to take medications? Independent  Current Functional Level Cognition  Overall Cognitive Status:  Impaired/Different from baseline Current Attention Level: Selective Orientation Level:  Oriented X4 Following Commands: Follows one step commands inconsistently, Follows one step commands with increased time Safety/Judgement: Decreased awareness of safety, Decreased awareness of deficits General Comments: Frequent redirection to task, as pt focused on "I'm going to walk" then on eating lunch. Poor problem solving, but following commands and motivated to move    Extremity Assessment (includes Sensation/Coordination)  Upper Extremity Assessment: RUE deficits/detail RUE Deficits / Details: generalized weakness, and discoordination grossly 2/3 out of 5 RUE Sensation: decreased light touch, decreased proprioception RUE Coordination: decreased fine motor, decreased gross motor LUE Deficits / Details: generalized weakness  Lower Extremity Assessment: Defer to PT evaluation RLE Deficits / Details: 2/5-3/5 grossly graded RLE Sensation: decreased proprioception RLE Coordination: decreased gross motor, decreased fine motor    ADLs  Overall ADL's : Needs assistance/impaired Eating/Feeding: NPO Grooming: Set up, Sitting, Supervision/safety Grooming Details (indicate cue type and reason): requires multiple cues for attention Upper Body Bathing: Minimal assistance, Sitting Upper Body Bathing Details (indicate cue type and reason): simulated through rubbing in lotion on arms Lower Body Bathing: Moderate assistance, Sitting/lateral leans Upper Body Dressing : Moderate assistance, Sitting Lower Body Dressing: Total assistance Toilet Transfer: Maximal assistance, +2 for physical assistance Toileting- Clothing Manipulation and Hygiene: Total assistance, +2 for physical assistance, +2 for safety/equipment, Bed level Functional mobility during ADLs: Maximal assistance, +2 for physical assistance, +2 for safety/equipment General ADL Comments: 2 person face to face transfer limited by hip pain, but able to clear bottom off of bed    Mobility  Overal bed mobility: Needs Assistance Bed  Mobility: Rolling, Sidelying to Sit Rolling: Mod assist Sidelying to sit: Max assist, +2 for safety/equipment Supine to sit: Max assist Sit to supine: Max assist, +2 for physical assistance(NT called in to assist) General bed mobility comments: MaxA to assist BLEs to EOB and assist trunk elevation; pt with c/o RLE pain but able to be redirected    Transfers  Overall transfer level: Needs assistance Equipment used: 1 person hand held assist Transfer via Lift Equipment: Stedy Transfers: Sit to/from [employer] to Stand: Max assist, +2 physical assistance General transfer comment: MaxA+2 to stand with bari stedy, heavy use of bed pad to fit hips into seat; pt with very forward flexed posture, able to correct to more upright posture with max cues when going to sit    Ambulation / Gait / Stairs / Wheelchair Mobility  Ambulation/Gait General Gait Details: unable    Posture / Balance Dynamic Sitting Balance Sitting balance - Comments: Min guard for balance Balance Overall balance assessment: Needs assistance Sitting-balance support: Feet supported, Bilateral upper extremity supported Sitting balance-Leahy Scale: Fair Sitting balance - Comments: Min guard for balance Postural control: Left lateral lean, Posterior lean Standing balance support: Bilateral upper extremity supported, During functional activity Standing balance-Leahy Scale: Zero Standing balance comment: face to face HHA    Special needs/care consideration BiPAP/CPAP No CPM No Continuous Drip IV No Dialysis No         Life Vest No Oxygen No Special Bed No Trach Size No Wound Vac (area) No      Skin Pressure injury to sacrum and buttocks                           Bowel mgmt: Incontinent of BM 02/11/19 Bladder mgmt: Incontinent, external catheter in place Diabetic mgmt No    Previous Home Environment Living Arrangements: Spouse/significant other  Lives With: Spouse Available Help at Discharge: Family, Available  24 hours/day Type of Home: House Home Layout: One level Home Access: Stairs to enter Entrance Stairs-Rails: Right Entrance Stairs-Number of Steps: 3 Bathroom Shower/Tub: Tub/shower unit, Multimedia programmer: Handicapped height Bathroom Accessibility: Yes How Accessible: Accessible via wheelchair [place]: No  Discharge Living Setting Plans for Discharge Living Setting: Patient's home, House, Lives with (comment)(Lives with wife.) Type of Home at Discharge: House Discharge Home Layout: One level Discharge Home Access: Stairs to enter Entrance Stairs-Rails: Left Entrance Stairs-Number of Steps: 2-3 Discharge Bathroom Shower/Tub: Tub/shower unit, Walk-in shower Discharge Bathroom Toilet: Handicapped height Discharge Bathroom Accessibility: Yes How Accessible: Accessible via wheelchair, Accessible via walker Does the patient have any problems obtaining your medications?: No  Social/Family/Support Systems Patient Roles: Spouse, Parent, Other (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  She stays at home, is not working. 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, museum/gallery Dietary Needs: Regular diet, thin liquids Equipment Needs: TBD Pt/Family Agrees to Admission and willing to participate: Yes Program Orientation Provided & Reviewed with Pt/Caregiver Including Roles  & Responsibilities: Yes   Decrease burden of Care through IP rehab admission: N/A  Possible need for SNF placement upon discharge: Not planned  Patient Condition: This patient's medical and functional status has changed since the consult dated: Original consult completed 01/25/19 in which the Rehabilitation Physician determined and documented that the patient's condition is appropriate for intensive rehabilitative care in an inpatient rehabilitation facility. See "History of Present Illness" (above) for medical update. Functional changes are: Currently requiring Max assist +2 for mobility and ADLs. Patient's medical and functional status update has been discussed with the Rehabilitation physician and patient remains appropriate for inpatient rehabilitation. Will admit to inpatient rehab today.  Preadmission Screen Completed By:  Joel Rogers, 02/12/2019 12:50 PM ______________________________________________________________________   Discussed status with Dr. Naaman Plummer on 02/12/19 at 1250 and received telephone approval for admission today.  Admission Coordinator:  Joel Rogers, time 1250/Date 02/12/19           Cosigned by: Meredith Staggers, MD at 02/12/2019 1:18 PM  Revision History

## 2019-02-13 NOTE — Evaluation (Addendum)
Physical Therapy Assessment and Plan  Patient Details  Name: Joel Rogers MRN: [id number] Date of Birth: 03/19/1951  PT Diagnosis: Abnormal posture, Abnormality of gait, Cognitive deficits, Coordination disorder, Difficulty walking, Edema, Hemiparesis dominant, Hypertonia, Impaired cognition, Muscle weakness and Pain in sternum and ribs Rehab Potential: Fair ELOS: 4 weeks   Today's Date: 02/13/2019 PT Individual Time: 3151-7616 PT Individual Time Calculation (min): 76 min    Problem List:  Patient Active Problem List   Diagnosis Date Noted  . Posterior circulation stroke ([place]) 02/12/2019  . Cerebral embolism with cerebral infarction 02/09/2019  . Malnutrition of moderate degree 02/08/2019  . Pressure injury of skin 02/07/2019  . VT (ventricular tachycardia) ([place])   . Acute coronary syndrome ([place]) 02/03/2019  . VF (ventricular fibrillation) ([place]) causing cardiac arrest 02/03/2019  . Acute bilateral cerebral infarction in a watershed distribution [facility]) 01/30/2019  . UGIB (upper gastrointestinal bleed) 01/26/2019  . Bacteremia due to Streptococcus 01/26/2019  . Cellulitis 01/26/2019  . Upper GI bleed   . Acute CVA (cerebrovascular accident) ([place]) 01/19/2019  . Acute renal failure ([place]) 01/14/2019  . Right shoulder pain 01/14/2019  . Sepsis ([place]) 01/13/2019  . S/P TAVR (transcatheter aortic valve replacement) 07/25/2018  . History of subdural hematoma   . Atrial fibrillation, chronic   . Acute on chronic diastolic heart failure ([place]) 06/15/2018  . Pulmonary hypertension, unspecified ([place]) 02/15/2018  . Varicose veins of right lower extremity with complications 07/37/1062  . History of Severe aortic stenosis 08/09/2017  . Vitamin D deficiency 12/02/2016  . Status post gastric bypass for obesity 11/02/2016  . HTN (hypertension) 04/13/2013  . Hyperlipemia 04/13/2013  . Chronic atrial fibrillation 02/14/2013  . Non-ST elevation (NSTEMI) myocardial infarction ([place]) 06/25/2008  .  Morbid obesity ([place]) 06/18/2008  . Obstructive sleep apnea 06/18/2008    Past Medical History:  Past Medical History:  Diagnosis Date  . Anemia    low iron  . Arthritis    bilateral knees  . Atrial fibrillation, chronic   . Chronic diastolic CHF (congestive heart failure) ([place]) 06/15/2018  . History of subdural hematoma   . Hyperlipidemia   . Hypertension   . Morbid obesity ([place])   . Myocardial infarction ([place])   . Normal coronary arteries    by cardiac catheterization performed 03/14/06  . Pre-diabetes    pt denies  . S/P TAVR (transcatheter aortic valve replacement)    a. 07/25/18: Edwards Sapien 3 THV (size 26 mm, model # 9600TFX, serial # [id number]) by Dr. Cyndia Bent and Dr. Angelena Form  . Sleep apnea    on CPAP  . Stroke ([place])   . Venous insufficiency   . VF (ventricular fibrillation) ([place]) causing cardiac arrest 02/03/2019   Past Surgical History:  Past Surgical History:  Procedure Laterality Date  . BIOPSY  01/26/2019   Procedure: BIOPSY;  Surgeon: Ronald Lobo, MD;  Location: [facility] ENDOSCOPY;  Service: Endoscopy;;  . CRANIOTOMY Right 08/27/2016   Procedure: CRANIOTOMY HEMATOMA EVACUATION SUBDURAL;  Surgeon: Erline Levine, MD;  Location: [place];  Service: Neurosurgery;  Laterality: Right;  . ESOPHAGOGASTRODUODENOSCOPY N/A 01/21/2019   Procedure: ESOPHAGOGASTRODUODENOSCOPY (EGD);  Surgeon: Clarene Essex, MD;  Location: [place];  Service: Endoscopy;  Laterality: N/A;  . ESOPHAGOGASTRODUODENOSCOPY (EGD) WITH PROPOFOL N/A 01/22/2019   Procedure: ESOPHAGOGASTRODUODENOSCOPY (EGD) WITH PROPOFOL;  Surgeon: Ronald Lobo, MD;  Location: [place];  Service: Endoscopy;  Laterality: N/A;  . ESOPHAGOGASTRODUODENOSCOPY (EGD) WITH PROPOFOL N/A 01/26/2019   Procedure: ESOPHAGOGASTRODUODENOSCOPY (EGD) WITH PROPOFOL;  Surgeon: Ronald Lobo, MD;  Location: [facility] ENDOSCOPY;  Service: Endoscopy;  Laterality: N/A;  . GASTRIC BYPASS  09/2008  . JOINT REPLACEMENT  08/31/2006   right hip  . LEFT HEART  CATH AND CORONARY ANGIOGRAPHY N/A 02/05/2019   Procedure: LEFT HEART CATH AND CORONARY ANGIOGRAPHY;  Surgeon: Jettie Booze, MD;  Location: [place] CV LAB;  Service: Cardiovascular;  Laterality: N/A;  . MULTIPLE EXTRACTIONS WITH ALVEOLOPLASTY N/A 07/13/2018   Procedure: Extraction of tooth #'s 3,8,10, 23-26, 30and 32 with alveoloplasty and gross debridement of remaining teeth;  Surgeon: Lenn Cal, DDS;  Location: [place];  Service: Oral Surgery;  Laterality: N/A;  . RIGHT HEART CATH N/A 07/06/2018   Procedure: RIGHT HEART CATH;  Surgeon: Burnell Blanks, MD;  Location: [place] CV LAB;  Service: Cardiovascular;  Laterality: N/A;  . RIGHT/LEFT HEART CATH AND CORONARY ANGIOGRAPHY N/A 07/10/2018   Procedure: RIGHT/LEFT HEART CATH AND CORONARY ANGIOGRAPHY;  Surgeon: Jolaine Artist, MD;  Location: [place] CV LAB;  Service: Cardiovascular;  Laterality: N/A;  . TEE WITHOUT CARDIOVERSION N/A 07/25/2018   Procedure: TRANSESOPHAGEAL ECHOCARDIOGRAM (TEE);  Surgeon: Burnell Blanks, MD;  Location: [place];  Service: Open Heart Surgery;  Laterality: N/A;  . TEE WITHOUT CARDIOVERSION N/A 01/18/2019   Procedure: TRANSESOPHAGEAL ECHOCARDIOGRAM (TEE) WITH PROPOFOL;  Surgeon: Satira Sark, MD;  Location: [facility];  Service: Cardiovascular;  Laterality: N/A;  . TOTAL HIP ARTHROPLASTY     right hip  . TRANSCATHETER AORTIC VALVE REPLACEMENT, TRANSFEMORAL  07/25/2018  . TRANSCATHETER AORTIC VALVE REPLACEMENT, TRANSFEMORAL N/A 07/25/2018   Procedure: TRANSCATHETER AORTIC VALVE REPLACEMENT, TRANSFEMORAL;  Surgeon: Burnell Blanks, MD;  Location: [place];  Service: Open Heart Surgery;  Laterality: N/A;    Assessment & Plan Clinical Impression: Patient is a 68 y.o. year old right-handed male with history of diastolic congestive heart failure, aortic stenosis with TAVR procedure September 2019,atrial fibrillation maintained on Eliquis, OSA with CPAP, hypertension. Per chart  review patient lives with spouse community ambulator and active prior to admission. Initially presented to [facility]/13/2019 secondary to right upper extremity pain and recent fall with right sided weakness findings of sepsis due to Staphylococcus bacteremia and cellulitis during admission maintain on antibiotic therapy. Noted persistent right side weakness. MRI the brain revealed multiple acute ischemic infarcts in a pattern most consistent with cardioembolic stroke. He was transferred to [facility] for further evaluation. Venous Doppler studies negative for DVTs. Follow-up MRI the brain with numerous scattered foci of acute ischemia throughout the cerebrum and cerebellum. No midline shift or mass effect. No proximal intracranial arterial occlusion. Echocardiogram with ejection fraction of 60% and normal systolic function. TEE showed no vegetation on bioprosthetic aortic valve. Patient remained on eliquis for atrial fibrillation with the addition of aspirin for CVA prophylaxis. He developed coffee-ground emesis with acute blood loss anemia requiring transfusion totaling 6 units pack red blood cells with gastroenterology consulted for EGD completed 2 with findings of red blood in the lower third of the esophagus clotted blood in the entire stomach. Blood thinners were held and follow-up GI endoscopy again completed 01/26/2019 showing no active bleeding or blood present. Recommendations were to hold aspirin and eliquis for approximately 3-5 days and then resume. He was placed on Protonix twice daily.hospital course complicated by AKI with creatinine baseline 1.1-1.23 elevated to 1.95 renal services consulted likely secondary to ischemic ATN in the setting of sepsis maintained on gentle IV fluids creatinine improved to 1.00. Therapy evaluations completed patient was admitted for  a comprehensive rehabilitation program 01/30/2019. On 02/03/2019 patient with burning chest pain felt like acid reflux  he did receive a dose of Maalox. Denied shortness of breath. Patient became unresponsive and code was called and noted to be in ventricular fibrillation. Troponin was noted to be elevated. He was discharged to acute care services 02/03/2019 maintain on intravenous heparin and followed by cardiology services. Echocardiogram was repeated with ejection fraction greater than 65% hyperdynamic systolic function.cardiac catheterization same day showed Lat Ramus lesion 99% stenosed. Heavy thrombus. No other significant CAD. His Eliquis was resumed and monitor for any bleeding episodes. Nasogastric tube initially in place and diet has been advanced to regular consistency. Therapies have been resumed patient tolerating nicely and was readmitted back to inpatient rehabilitation services. Patient transferred to CIR on 02/12/2019 .   Patient currently requires total with mobility secondary to muscle weakness, decreased cardiorespiratoy endurance, impaired timing and sequencing, abnormal tone, unbalanced muscle activation and decreased coordination and decreased sitting balance, decreased standing balance, decreased postural control, hemiplegia and decreased balance strategies.  Prior to initial hospitalization, patient was independent  with mobility and lived with Spouse in a House home.  Home access is 3Stairs to enter.  Patient will benefit from skilled PT intervention to maximize safe functional mobility, minimize fall risk and decrease caregiver burden for planned discharge home with 24 hour assist.  Anticipate patient will benefit from follow up [facility] at discharge.  PT - End of Session Activity Tolerance: Tolerates 30+ min activity with multiple rests Endurance Deficit: Yes Endurance Deficit Description: generalized deconditioning,  limited by pain and generalized weakness PT Assessment Rehab Potential (ACUTE/IP ONLY): Fair PT Barriers to Discharge: Inaccessible home environment;Decreased caregiver support;Home  environment access/layout PT Barriers to Discharge Comments: Steps to enter house, patient's wife cannot provide physical assist PT Patient demonstrates impairments in the following area(s): Balance;Safety;Edema;Endurance;Skin Integrity;Motor;Pain PT Transfers Functional Problem(s): Bed Mobility;Bed to Chair;Car;Furniture PT Locomotion Functional Problem(s): Ambulation;Wheelchair Mobility;Stairs PT Plan PT Intensity: Minimum of 1-2 x/day ,45 to 90 minutes PT Frequency: 5 out of 7 days PT Duration Estimated Length of Stay: 4 weeks PT Treatment/Interventions: Ambulation/gait training;Community reintegration;DME/adaptive equipment instruction;Neuromuscular re-education;Psychosocial support;Stair training;UE/LE Strength taining/ROM;Wheelchair propulsion/positioning;Balance/vestibular training;Discharge planning;Functional electrical stimulation;Pain management;Skin care/wound management;Therapeutic Activities;UE/LE Coordination activities;Cognitive remediation/compensation;Disease management/prevention;Functional mobility training;Patient/family education;Splinting/orthotics;Therapeutic Exercise PT Transfers Anticipated Outcome(s): min assist with LRAD PT Locomotion Anticipated Outcome(s): supervision w/c level PT Recommendation Recommendations for Other Services: Therapeutic Recreation consult Therapeutic Recreation Interventions: Stress management;Outing/community reintergration Follow Up Recommendations: Home health PT;24 hour supervision/assistance Patient destination: Home Equipment Recommended: To be determined  Skilled Therapeutic Intervention Patient in bed upon PT arrival. Patient alert and agreeable to PT session.  Therapeutic Activity: Patient performed supine to sit, scooting EOB, and a slide board transfer with max A +2 with cues for hand placement, trunk lean for scooting, and explanation of head hips relationship.  Attempted sit to stand x1 with bed elevated with Max-total A of 2  and patient was unable to clear his bottom from the bed.   Wheelchair Mobility:  Patient propelled wheelchair 10' feet with Max A. Provided verbal cues for using both UE to propel, with hand over hand on R UE, steering straight ahead, tends to veer to the R. Recommend trying LE assist during next session.  Neuromuscular Re-ed: Patient performed reaching in sitting in w/c forward, across midline, and out to the side x2 minutes with cues to correct posture back to midline between reaches and to sit up so that the w/c was not support his trunk.  Performed B knee flexion/extension in sitting with tactile and verbal cues for muscle activation, starting with PROM x2 and progressing to AAROM x5 with improved activation.  Patient in w/c at end of session with breaks locked, seat belt alarm set, and all needs within reach.   PT Evaluation Precautions/Restrictions Precautions Precautions: Fall General   Vital Signs  Pain Pain Assessment Pain Score: 7 (only with movement such as rolling in bed, pain in ribs/ sternum) Home Living/Prior Functioning Home Living Available Help at Discharge: Family;Available 24 hours/day Type of Home: House Home Access: Stairs to enter [employer] of Steps: 3 Entrance Stairs-Rails: Right Home Layout: One level Additional Comments: Patient's wife can only provide supervision assistance, sisters and sons may be available to provide physical assistance.  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 This was prior to Feb 2020 admission. Vision/Perception  Perception Perception: (to be assessed further in functional context , but no  apparent deficits on evaluation) Praxis Praxis: (to be assessed further in functional context , but no apparent deficits on evaluation)  Cognition Overall Cognitive Status: Impaired/different from baseline Arousal/Alertness: Awake/alert Orientation Level: Oriented X4 Attention: Sustained;Selective Sustained Attention: Appears intact Selective Attention: Appears intact Memory: Impaired Memory Impairment: Retrieval deficit Safety/Judgment: Appears intact Sensation Sensation Light Touch: Appears Intact Hot/Cold: Not tested Proprioception: Appears Intact Stereognosis: Not tested Coordination Gross Motor Movements are Fluid and Coordinated: No Fine Motor Movements are Fluid and Coordinated: No Coordination and Movement Description: BUE & BLE weakness, R hemiplegia Motor  Motor Motor: Abnormal postural alignment and control;Abnormal tone;Hemiplegia Motor - Skilled Clinical Observations: B hemiparesis (R>L), generalized deconditioning, mild clonus on L -2 beats  Mobility Bed Mobility Bed Mobility: Supine to Sit;Sitting - Scoot to Edge of Bed Supine to Sit: 2 Helpers;Maximal Assistance - Patient - Patient 25-49% Sitting - Scoot to Edge of Bed: 2 Helpers;Maximal Assistance - Patient 25-49% Transfers Transfers: Sit to Stand;Lateral/Scoot Transfers Sit to Stand: 2 Helpers;Total Assistance - Patient < 25%(Attempted to stand with HHA +2, unable to clear bottom off of bed with bed elevated) Lateral/Scoot Transfers: 2 Helpers;Maximal Assistance - Patient 25-49%(with slide board) Transfer (Assistive device): Other (Comment)(slide board) Locomotion  Gait Ambulation: No Gait Gait: No Stairs / Additional Locomotion Stairs: No Wheelchair Mobility Wheelchair Mobility: Yes Wheelchair Assistance: Maximal Assistance - Patient 25 - 49% Wheelchair Propulsion: Both upper extremities Wheelchair Parts Management: Needs assistance Distance: 10'  Trunk/Postural Assessment  Cervical  Assessment Cervical Assessment: Exceptions to WFL(forward head) Thoracic Assessment Thoracic Assessment: Exceptions to WFL(Kyphotic) Lumbar Assessment Lumbar Assessment: Exceptions to WFL(posterior pelvic tilt) Postural Control Postural Control: Deficits on evaluation Righting Reactions: delayed, L lean able to self correct with cues Protective Responses: delayed  Balance Balance Balance Assessed: Yes Static Sitting Balance Static Sitting - Balance Support: Feet supported;Right upper extremity supported Static Sitting - Level of Assistance: 4: Min assist Static Sitting - Comment/# of Minutes: Able to sit EOB with CGA for 5 seconds, limited by decreased endurance and strength, sat min A EOB for >5 min Dynamic Sitting Balance Sitting balance - Comments: Mod-Max a for weight shifts and min A for forward trunk lean Extremity Assessment  RUE Assessment Passive Range of Motion (PROM) Comments: WFL, slightly limited in finger flexion due to edema Active Range of Motion (AROM)  Comments: limited shoulder ROM of only 20 degrees of flexion, elbow flexion/extension [facility] General Strength Comments: 2/5 shoulder flexion, 3+/5 elbow flexion/extension, 2/5 grip strength RUE Body System: Neuro Brunstrum levels for arm and hand: Arm Brunstrum level for arm: Stage III Synergy is performed voluntarily Brunstrum level for hand: Stage III Synergies performed voluntarily LUE Assessment LUE Assessment: Exceptions to [facility] Passive Range of Motion (PROM) Comments: WFL Active Range of Motion (AROM) Comments: limited shoulder ROM, elbow flexion/extension WFL General Strength Comments: grossly 4/5 throughout RLE Assessment RLE Assessment: Exceptions to [facility] Passive Range of Motion (PROM) Comments: limited hip and knee ROM due to increased tone Active Range of Motion (AROM) Comments: limited due to increased tone General Strength Comments: hip flexion 2-/5, knee flexion/extension 2+/5, ankle DF/PF 4/5 grossly in  sitting LLE Assessment LLE Assessment: Exceptions to [facility] Passive Range of Motion (PROM) Comments: WFL Active Range of Motion (AROM) Comments: hip flexion and knee flexion/extension limited by generalized weakness General Strength Comments: hip flexion 2-/5, knee flexion/extension 2+/5, ankle DF/PF 4/5 grossly in sitting    Refer to Care Plan for Long Term Goals  Recommendations for other services: Therapeutic Recreation  Stress management  Discharge Criteria: Patient will be discharged from PT if patient refuses treatment 3 consecutive times without medical reason, if treatment goals not met, if there is a change in medical status, if patient makes no progress towards goals or if patient is discharged from hospital.  The above assessment, treatment plan, treatment alternatives and goals were discussed and mutually agreed upon: by patient  Doreene Burke, PT, DPT 02/13/2019, 1:07 PM

## 2019-02-14 ENCOUNTER — Inpatient Hospital Stay (HOSPITAL_COMMUNITY): Payer: Self-pay | Admitting: Occupational Therapy

## 2019-02-14 ENCOUNTER — Inpatient Hospital Stay (HOSPITAL_COMMUNITY): Payer: Self-pay | Admitting: Physical Therapy

## 2019-02-14 NOTE — Progress Notes (Signed)
Joel Rogers PHYSICAL MEDICINE & REHABILITATION PROGRESS NOTE   Subjective/Complaints:  Very slow with WC mobility, but feels ok this am, states he ate well.  No pain c/o while working with PT ROS- denies SOB, bowel or bladder issues chest is sore at lower ribs   Objective:   No results found. Recent Labs    02/12/19 0348 02/13/19 0511  WBC 15.3* 14.9*  HGB 7.8* 8.0*  HCT 23.8* 25.4*  PLT 224 228   Recent Labs    02/13/19 0511  NA 141  K 3.9  CL 107  CO2 25  GLUCOSE 95  BUN 18  CREATININE 0.84  CALCIUM 8.2*    Intake/Output Summary (Last 24 hours) at 02/14/2019 0654 Last data filed at 02/14/2019 0548 Gross per 24 hour  Intake 360 ml  Output 2500 ml  Net -2140 ml     Physical Exam: Vital Signs Blood pressure 127/89, pulse 70, temperature 98.6 F (37 C), temperature source Oral, resp. rate 18, weight 117.2 kg, SpO2 100 %.  General: No acute distress Mood and affect are appropriate Heart: Regular rate and rhythm no rubs murmurs or extra sounds Chest tenderness to palpation sterno xiphoid junction Lungs: Clear to auscultation, breathing unlabored, no rales or wheezes Abdomen: Positive bowel sounds, soft nontender to palpation, nondistended Extremities:3+ edema RUE, 2+ edema RLE Skin: No evidence of breakdown, no evidence of rash Neurologic: Cranial nerves II through XII intact, motor strength is 5/5 in left and 3- RIght  deltoid, bicep, tricep, grip, hip flexor,2- right and 3- left knee extensors, ankle dorsiflexor and plantar flexor   Musculoskeletal: Full range of motion in all 4 extremities. No joint swelling    Assessment/Plan: 1. Functional deficits secondary to cardioembolic bicerebral infarcts which require 3+ hours per day of interdisciplinary therapy in a comprehensive inpatient rehab setting.  Physiatrist is providing close team supervision and 24 hour management of active medical problems listed below.  Physiatrist and rehab team continue to  assess barriers to discharge/monitor patient progress toward functional and medical goals  Care Tool:  Bathing    Body parts bathed by patient: Chest, Abdomen, Left upper leg, Face   Body parts bathed by helper: Right arm, Left arm, Front perineal area, Buttocks, Right upper leg, Left lower leg, Right lower leg     Bathing assist Assist Level: Maximal Assistance - Patient 24 - 49%(bed level)     Upper Body Dressing/Undressing Upper body dressing   What is the patient wearing?: Hospital gown only    Upper body assist Assist Level: Maximal Assistance - Patient 25 - 49%    Lower Body Dressing/Undressing Lower body dressing      What is the patient wearing?: Incontinence brief     Lower body assist Assist for lower body dressing: Total Assistance - Patient < 25%     Toileting Toileting    Toileting assist Assist for toileting: Total Assistance - Patient < 25%     Transfers Chair/bed transfer  Transfers assist  Chair/bed transfer activity did not occur: Safety/medical concerns  Chair/bed transfer assist level: 2 Helpers(maxi)     Locomotion Ambulation   Ambulation assist   Ambulation activity did not occur: Safety/medical concerns          Walk 10 feet activity   Assist  Walk 10 feet activity did not occur: Safety/medical concerns        Walk 50 feet activity   Assist Walk 50 feet with 2 turns activity did not occur: Safety/medical concerns           Walk 150 feet activity   Assist Walk 150 feet activity did not occur: Safety/medical concerns         Walk 10 feet on uneven surface  activity   Assist Walk 10 feet on uneven surfaces activity did not occur: Safety/medical concerns         Wheelchair     Assist Will patient use wheelchair at discharge?: Yes Type of Wheelchair: Manual    Wheelchair assist level: Maximal Assistance - Patient 25 - 49% Max wheelchair distance: 10'    Wheelchair 50 feet with 2 turns  activity    Assist    Wheelchair 50 feet with 2 turns activity did not occur: Safety/medical concerns(limited by fatigue)       Wheelchair 150 feet activity     Assist Wheelchair 150 feet activity did not occur: Safety/medical concerns(limited by fatigue)          Medical Problem List and Plan: 1.Persistent right side weakness with decreased mobilitysecondary to cardioembolic bi-cerebral infarction Team conference today please see physician documentation under team conference tab, met with team face-to-face to discuss problems,progress, and goals. Formulized individual treatment plan based on medical history, underlying problem and comorbidities. 2. Antithrombotics: -DVT/anticoagulation:ELIQUIS -antiplatelet therapy:  3. Pain Management:Ultracet as needed 4. Mood:Provide emotional support -antipsychotic agents: none 5. Neuropsych: This patientiscapable of making decisions on hisown behalf. 6. Skin/Wound Care:Routine skin checks 7. Fluids/Electrolytes/Nutrition:Routine in and out's with follow-up chemistries 8. Upper GI bleed EGD 3. Follow-up gastrin neurology services. Continue PPI -serial H/H's while on eliquis 9. Acute blood loss anemia. Patient transfused throughout hospital course. Follow-up CBC, hgb has been 7.5-8 g 10. Chronic atrial fibrillation with TAVR procedure September 2019- Rate controlled 3/25 11. Non-STEMI. Thought secondary to thrombus status post cardiac catheterization. Continue eliquis 12. Acute on chronic diastolic congestive heart failure. Lasix 40 mg daily -daily weights stable Filed Weights   02/12/19 1430 02/13/19 0602 02/14/19 0544  Weight: 117.4 kg 117.2 kg 117.2 kg  13. Hypertension. Norvasc 5 mg daily, Lopressor 50 mg twice a day. Monitor with increased mobility Vitals:   02/13/19 1925 02/14/19 0544  BP: 120/81 127/89  Pulse: 77 70  Resp: 18 18  Temp: 98.2 F (36.8  C) 98.6 F (37 C)  SpO2: 99% 100%  Controlled 3/25 14. Hyperlipidemia. Lipitor  LOS: 2 days A FACE TO FACE EVALUATION WAS PERFORMED  Charlett Blake 02/14/2019, 6:54 AM

## 2019-02-14 NOTE — Progress Notes (Signed)
Physical Therapy Session Note  Patient Details  Name: Joel Rogers MRN: [id number] Date of Birth: [birth year]  Today's Date: 02/14/2019 PT Individual Time: 0815-0915 PT Individual Time Calculation (min): 60 min   Short Term Goals: Week 1:  PT Short Term Goal 1 (Week 1): Pt will transfer bed<>w/c with max assist +1 using LRAD. PT Short Term Goal 2 (Week 1): Pt will complete bed mobility with mod assist +1. PT Short Term Goal 3 (Week 1): Pt will propel w/c 25 ft with min assist.   Skilled Therapeutic Interventions/Progress Updates:   Pt received supine in bed and requesting to finish breakfast. PT returned in 15 minutes and Pt agreeable to PT. Pt Supine>sit transfer with total assist for safety and pain management in ribs. Sitting balance EOB x 5 minutes with supervision assist from PT  Sit<>stand in stedy x 5 from elevated bed and seat of stedy. Max + 2 assist to facilitate improved anterior weight shift, and glute activation. BP assessed in sitting EOB 127/83, HR 99bpm. BP in standing 146/8, HR 89bpm. No s/s of orthostasis.  PT also assisted pt to don upper and lower body clothing while sitting EOB.   WC mobility. X 11f +124f+2542fnd mod assist from PT. PT provided max cues for attention to the RUE and improved shoulder activation to improve length to propulsion strokes. Tband also applied by PT to improve use of RUE due to grip strength and coordination deficits.    Patient returned to room and [facility] and all needs met.          Therapy Documentation Precautions:  Precautions Precautions: Fall Restrictions Weight Bearing Restrictions: No General: PT Amount of Missed Time (min): 15 Minutes PT Missed Treatment Reason: Other (Comment)(eating ) Vital Signs: Therapy Vitals Temp: 98.6 F (37 C) Temp Source: Oral Pulse Rate: 70 Resp: 18 BP: 127/89 Patient Position (if appropriate): Lying Oxygen Therapy SpO2: 100 % O2 Device: Room  Air Pain: Denies    Therapy/Group: Individual Therapy  AusLorie Phenix25/2020, 9:39 AM

## 2019-02-14 NOTE — Plan of Care (Signed)
  Problem: Consults Goal: RH STROKE PATIENT EDUCATION Description See Patient Education module for education specifics  Outcome: Progressing   Problem: RH BOWEL ELIMINATION Goal: RH STG MANAGE BOWEL WITH ASSISTANCE Description STG Manage Bowel with max Assistance.  Outcome: Progressing   Problem: RH BLADDER ELIMINATION Goal: RH STG MANAGE BLADDER WITH ASSISTANCE Description STG Manage Bladder With max Assistance  Outcome: Progressing   Problem: RH SKIN INTEGRITY Goal: RH STG SKIN FREE OF INFECTION/BREAKDOWN Description Maintain with min assist  Outcome: Progressing Goal: RH STG MAINTAIN SKIN INTEGRITY WITH ASSISTANCE Description STG Maintain Skin Integrity With min Assistance.  Outcome: Progressing   Problem: RH SAFETY Goal: RH STG ADHERE TO SAFETY PRECAUTIONS W/ASSISTANCE/DEVICE Description STG Adhere to Safety Precautions With cues/reminders Assistance/Device.  Outcome: Progressing   Problem: RH PAIN MANAGEMENT Goal: RH STG PAIN MANAGED AT OR BELOW PT'S PAIN GOAL Description At or below level 4  Outcome: Progressing   Problem: RH KNOWLEDGE DEFICIT Goal: RH STG INCREASE KNOWLEGDE OF HYPERLIPIDEMIA Description Pt will be able to manage HLD with medications and dietary restrictions with min assist  Outcome: Progressing Goal: RH STG INCREASE KNOWLEDGE OF STROKE PROPHYLAXIS Description Pt will be able to explain management of secondary stroke with diet and medications using handouts and resources with min assist  Outcome: Progressing Goal: RH STG INCREASE KNOWLEDGE OF HYPERTENSION Description Pt will be able to manage HTN with medications and dietary restrictions with min assist   Outcome: Progressing

## 2019-02-14 NOTE — IPOC Note (Addendum)
Overall Plan of Care [facility]) Patient Details Name: Joel Rogers MRN: [id number] DOB: June 19, 1951  Admitting Diagnosis: <principal problem not specified>  Hospital Problems: Active Problems:   Posterior circulation stroke [facility])     Functional Problem List: Nursing Motor, Bladder, Bowel, Skin Integrity, Edema, Endurance, Medication Management, Safety, Pain  PT Balance, Safety, Edema, Endurance, Skin Integrity, Motor, Pain  OT Balance, Endurance, Motor, Pain, Edema, Safety  SLP    TR         Basic ADL's: OT Grooming, Bathing, Dressing, Eating     Advanced  ADL's: OT       Transfers: PT Bed Mobility, Bed to Chair, Car, Occupational psychologist, Research scientist (life sciences): PT Ambulation, Psychologist, prison and probation services, Stairs     Additional Impairments: OT Fuctional Use of Upper Extremity  SLP        TR      Anticipated Outcomes Item Anticipated Outcome  Self Feeding set up  Swallowing      Basic self-care  min A  Toileting  min A   Bathroom Transfers min A  Bowel/Bladder  continent of bowel and bladder with max assist  Transfers  min assist with LRAD  Locomotion  supervision w/c level  Communication     Cognition     Pain  pain at or below level 4  Safety/Judgment  maintain safety with min assist   Therapy Plan: PT Intensity: Minimum of 1-2 x/day ,45 to 90 minutes PT Frequency: 5 out of 7 days PT Duration Estimated Length of Stay: 4 weeks OT Intensity: Minimum of 1-2 x/day, 45 to 90 minutes OT Frequency: 5 out of 7 days OT Duration/Estimated Length of Stay: 28-30 days      Team Interventions: Nursing Interventions Patient/Family Education, Bowel Management, Pain Management, Skin Care/Wound Management, Psychosocial Support, Disease Management/Prevention, Medication Management, Bladder Management, Discharge Planning  PT interventions Ambulation/gait training, Community reintegration, DME/adaptive equipment instruction, Neuromuscular re-education, Psychosocial  support, Stair training, UE/LE Strength taining/ROM, Wheelchair propulsion/positioning, Warden/ranger, Discharge planning, Functional electrical stimulation, Pain management, Skin care/wound management, Therapeutic Activities, UE/LE Coordination activities, Cognitive remediation/compensation, Disease management/prevention, Functional mobility training, Patient/family education, Splinting/orthotics, Therapeutic Exercise  OT Interventions Balance/vestibular training, Discharge planning, Disease mangement/prevention, DME/adaptive equipment instruction, Functional electrical stimulation, Functional mobility training, Neuromuscular re-education, Pain management, Patient/family education, Psychosocial support, Self Care/advanced ADL retraining, Skin care/wound managment, Splinting/orthotics, Therapeutic Activities, Therapeutic Exercise, UE/LE Strength taining/ROM, UE/LE Coordination activities  SLP Interventions    TR Interventions    SW/CM Interventions  Pt and Family Education, Psychsocial Assessment & Discharge Planning   Barriers to Discharge MD  Medical stability  Nursing Incontinence    PT Inaccessible home environment Steps to enter house, patient's wife cannot provide physical assist  OT Home environment access/layout steps to enter  SLP      SW       Team Discharge Planning: Destination: PT-Home ,OT- Home , SLP-  Projected Follow-up: PT-Home health PT, 24 hour supervision/assistance, OT-  Home health OT, 24 hour supervision/assistance, SLP-  Projected Equipment Needs: PT-To be determined, OT- Tub/shower bench, 3 in 1 bedside comode, SLP-  Equipment Details: PT- , OT-wide drop arm commode Patient/family involved in discharge planning: PT- Patient,  OT-Patient, SLP-   MD ELOS: 21-24d Medical Rehab Prognosis:  Good Assessment:  68 year old right-handed male with history of diastolic congestive heart failure, aortic stenosis with TAVR procedure September 2019,atrial  fibrillation maintained on Eliquis, OSA with CPAP, hypertension. Per chart review patient lives with spouse community ambulator and active  prior to admission. Initially presented to [facility] 01/13/2019 secondary to right upper extremity pain and recent fall with right sided weakness findings of sepsis due to Staphylococcus bacteremia and cellulitis during admission maintain on antibiotic therapy. Noted persistent right side weakness. MRI the brain revealed multiple acute ischemic infarcts in a pattern most consistent with cardioembolic stroke. He was transferred to [facility] for further evaluation. Venous Doppler studies negative for DVTs. Follow-up MRI the brain with numerous scattered foci of acute ischemia throughout the cerebrum and cerebellum. No midline shift or mass effect. No proximal intracranial arterial occlusion. Echocardiogram with ejection fraction of 60% and normal systolic function. TEE showed no vegetation on bioprosthetic aortic valve. Patient remained on eliquis for atrial fibrillation with the addition of aspirin for CVA prophylaxis. He developed coffee-ground emesis with acute blood loss anemia requiring transfusion totaling 6 units pack red blood cells with gastroenterology consulted for EGD completed 2 with findings of red blood in the lower third of the esophagus clotted blood in the entire stomach. Blood thinners were held and follow-up GI endoscopy again completed 01/26/2019 showing no active bleeding or blood present. Recommendations were to hold aspirin and eliquis for approximately 3-5 days and then resume. He was placed on Protonix twice daily.hospital course complicated by AKI with creatinine baseline 1.1-1.23 elevated to 1.95 renal services consulted likely secondary to ischemic ATN in the setting of sepsis maintained on gentle IV fluids creatinine improved to 1.00. Therapy evaluations completed patient was admitted for a comprehensive rehabilitation program  01/30/2019. On 02/03/2019 patient with burning chest pain felt like acid reflux he did receive a dose of Maalox. Denied shortness of breath. Patient became unresponsive and code was called and noted to be in ventricular fibrillation. Troponin was noted to be elevated. He was discharged to acute care services 02/03/2019 maintain on intravenous heparin and followed by cardiology services. Echocardiogram was repeated with ejection fraction greater than 65% hyperdynamic systolic function.cardiac catheterization same day showed Lat Ramus lesion 99% stenosed. Heavy thrombus. No other significant CAD. His Eliquis was resumed and monitor for any bleeding episodes   Now requiring 24/7 Rehab RN,MD, as well as CIR level PT, OT and SLP.  Treatment team will focus on ADLs and mobility with goals set at [facility]  See Team Conference Notes for weekly updates to the plan of care

## 2019-02-14 NOTE — Patient Care Conference (Signed)
Inpatient RehabilitationTeam Conference and Plan of Care Update Date: 02/14/2019   Time: 10:45 AM    Patient Name: Joel Rogers      Medical Record Number: [id number]  Date of Birth: [birth year] Sex: Male         Room/Bed: 4W22C/4W22C-01 Payor Info: Payor: Advertising copywriter MEDICARE / Plan: [employer] MEDICARE / Product Type: *No Product type* /    Admitting Diagnosis: nultiple infarcts recent cardiac arrest mi  Admit Date/Time:  02/12/2019  2:30 PM Admission Comments: No comment available   Primary Diagnosis:  <principal problem not specified> Principal Problem: <principal problem not specified>  Patient Active Problem List   Diagnosis Date Noted  . Posterior circulation stroke (HCC) 02/12/2019  . Cerebral embolism with cerebral infarction 02/09/2019  . Malnutrition of moderate degree 02/08/2019  . Pressure injury of skin 02/07/2019  . VT (ventricular tachycardia) (HCC)   . Acute coronary syndrome (HCC) 02/03/2019  . VF (ventricular fibrillation) (HCC) causing cardiac arrest 02/03/2019  . Acute bilateral cerebral infarction in a watershed distribution [facility]) 01/30/2019  . UGIB (upper gastrointestinal bleed) 01/26/2019  . Bacteremia due to Streptococcus 01/26/2019  . Cellulitis 01/26/2019  . Upper GI bleed   . Acute CVA (cerebrovascular accident) (HCC) 01/19/2019  . Acute renal failure (HCC) 01/14/2019  . Right shoulder pain 01/14/2019  . Sepsis (HCC) 01/13/2019  . S/P TAVR (transcatheter aortic valve replacement) 07/25/2018  . History of subdural hematoma   . Atrial fibrillation, chronic   . Acute on chronic diastolic heart failure (HCC) 06/15/2018  . Pulmonary hypertension, unspecified (HCC) 02/15/2018  . Varicose veins of right lower extremity with complications 01/10/2018  . History of Severe aortic stenosis 08/09/2017  . Vitamin D deficiency 12/02/2016  . Status post gastric bypass for obesity 11/02/2016  . HTN (hypertension) 04/13/2013  . Hyperlipemia 04/13/2013  . Chronic  atrial fibrillation 02/14/2013  . Non-ST elevation (NSTEMI) myocardial infarction (HCC) 06/25/2008  . Morbid obesity (HCC) 06/18/2008  . Obstructive sleep apnea 06/18/2008    Expected Discharge Date: Expected Discharge Date: 2019/03/29  Team Members Present: Physician leading conference: Dr. Claudette Laws Social Worker Present: Dossie Der, LCSW Nurse Present: Doran Durand, LPN PT Present: Grier Rocher, PT;[name], PTA OT Present: Jackquline Denmark, OT SLP Present: Colin Benton, SLP PPS Coordinator present : Fae Pippin     Current Status/Progress Goal Weekly Team Focus  Medical   No further gout treatments continues to have right upper and right lower extremity edema with previous negative Dopplers.  Very deconditioned right hemiparesis  Monitor neuro status as well as CBC on Eliquis  Monitor for recurrent GI bleed   Bowel/Bladder   continent with episodes of incontinence of bowel & bladder, LBM 02/13/19  less episodes of incontinence  timed toileting   Swallow/Nutrition/ Hydration             ADL's   +2 bed mobility, slide board transfers;  max UB self care, total A LB self care  Min assist bathing/dressing and transfers, supervision grooming and sitting balance  ADL training, endurance, RUE NMR, functional mobility, pt education   Mobility   total assist bed mobility. Stedy transfer + 2 max assist. Mod-max assist WC mobility up to 108ft due to coordination deficits   currently set for Min assist bed mobility, transfers, and gait.   Improved balance, endurance, strength, posture, NMR, WC mobility    Communication             Safety/Cognition/ Behavioral Observations  Pain   c/o pain to rib cage, only has ultracet Q6hrs prn  pain scale <4/10  assess & treat as needed   Skin   small ulcer to the sacrum with white center, has a foam dressing, generalized edema with pitting edema to RUE & RLE, discolorations to BLE, with dry flaking to RLE  no s/s of  infection & non-healing to sacral wound  assess q shift      *See Care Plan and progress notes for long and short-term goals.     Barriers to Discharge  Current Status/Progress Possible Resolutions Date Resolved   Physician    Medical stability        Continue rehab, monitor CBC      Nursing  Incontinence               PT  Inaccessible home environment  Steps to enter house, patient's wife cannot provide physical assist              OT Home environment access/layout  steps to enter             SLP                SW                Discharge Planning/Teaching Needs:  Home with wife who can provide 24 hr care she is in good health. Pt motivated to do well here and aware who deconditioned he is from all that has happened to him      Team Discussion:  Goals min level of assist, unsure if these are too high and will see in the coming weeks with progress. PICC removed 3/24. Checking blood and labs due to anemia. Currently max-total assist. Rib pain limits him.Sacrum wound healing will leave condom cath until moving better.  Revisions to Treatment Plan:  DC 4/21    Continued Need for Acute Rehabilitation Level of Care: The patient requires daily medical management by a physician with specialized training in physical medicine and rehabilitation for the following conditions: Daily direction of a multidisciplinary physical rehabilitation program to ensure safe treatment while eliciting the highest outcome that is of practical value to the patient.: Yes Daily medical management of patient stability for increased activity during participation in an intensive rehabilitation regime.: Yes Daily analysis of laboratory values and/or radiology reports with any subsequent need for medication adjustment of medical intervention for : Neurological problems   I attest that I was present, lead the team conference, and concur with the assessment and plan of the team.   Lucy Chris 02/14/2019,  1:25 PM

## 2019-02-14 NOTE — Progress Notes (Signed)
Occupational Therapy Session Note  Patient Details  Name: Joel Rogers MRN: [id number] Date of Birth: 10-07-1951  Today's Date: 02/14/2019 OT Individual Time: 1157-2620 and 1345-1445 OT Individual Time Calculation (min): 71 min and 60 min    Short Term Goals: Week 1:  OT Short Term Goal 1 (Week 1): Pt will complete sit > stand with max assist +2 to decrease burden of care for LB bathing/dressing OT Short Term Goal 2 (Week 1): Pt will complete UB dressing with mod assist OT Short Term Goal 3 (Week 1): Pt will complete toilet transfer with max assist +2 OT Short Term Goal 4 (Week 1): Pt will complete bathing with mod assist from bed level.    Skilled Therapeutic Interventions/Progress Updates:   Session 1: Upon entering the room, pt seated in wheelchair with breakfast tray. OT placed built up handle on utensil and pt able to feed self with assistance to scoop food onto utensil and he was able to bring to mouth without drops. Pt declined bathing and dressing tasks this session and OT assisted pt into hallway via wheelchair. Pt attempting to propel wheelchair 20' with min A and verbal cuing to utilize R UE in task as well. Pt began isolated table top activities with forced use of R UE and resistive peg board. Pt verbalized need for toileting. Pt standing into STEDY with mod A of 2 and needing total A for clothing management prior to pt transferring onto elevated drop arm commode chair. RN remained as OT exited the room.    Session 2: Upon entering the room, pt supine in bed and sleeping soundly. Pt verbalized brief was soiled. Pt rolled R with max A and needing total A for hygiene. OT rolling pt to the L with max A as well and pt soiled brief as well as bed. Second helper arriving to assist with hygiene and changing sheets with pt rolling L <> R multiple times with max A. Pt verbalized ribs hurting and needing rest break with rolling but OT able to continue to complete task with increased time  given. Pt remained in bed at end of session with call bell and all needed items within reach.   Therapy Documentation Precautions:  Precautions Precautions: Fall Restrictions Weight Bearing Restrictions: No General: General PT Missed Treatment Reason: Other (Comment)(eating ) ADL: ADL Upper Body Bathing: Maximal assistance Where Assessed-Upper Body Bathing: Bed level Lower Body Bathing: Dependent Where Assessed-Lower Body Bathing: Bed level Upper Body Dressing: Maximal assistance Where Assessed-Upper Body Dressing: Bed level Lower Body Dressing: Dependent Where Assessed-Lower Body Dressing: Bed level   Therapy/Group: Individual Therapy  [name], MS, OTR/L 02/14/2019, 12:00 PM

## 2019-02-15 ENCOUNTER — Inpatient Hospital Stay (HOSPITAL_COMMUNITY): Payer: Self-pay | Admitting: Occupational Therapy

## 2019-02-15 ENCOUNTER — Inpatient Hospital Stay (HOSPITAL_COMMUNITY): Payer: Self-pay | Admitting: Speech Pathology

## 2019-02-15 ENCOUNTER — Inpatient Hospital Stay (HOSPITAL_COMMUNITY): Payer: Self-pay

## 2019-02-15 ENCOUNTER — Inpatient Hospital Stay (HOSPITAL_COMMUNITY): Payer: Self-pay | Admitting: Physical Therapy

## 2019-02-15 NOTE — Evaluation (Signed)
Speech Language Pathology Assessment and Plan  Patient Details  Name: Joel Rogers MRN: [id number] Date of Birth: 05-06-1951  SLP Diagnosis: Cognitive Impairments  Rehab Potential: Good ELOS: 4 weeks    Today's Date: 02/15/2019 SLP Individual Time: 0930-1030 SLP Individual Time Calculation (min): 60 min   Problem List:  Patient Active Problem List   Diagnosis Date Noted  . Posterior circulation stroke ([place]) 02/12/2019  . Cerebral embolism with cerebral infarction 02/09/2019  . Malnutrition of moderate degree 02/08/2019  . Pressure injury of skin 02/07/2019  . VT (ventricular tachycardia) ([place])   . Acute coronary syndrome ([place]) 02/03/2019  . VF (ventricular fibrillation) ([place]) causing cardiac arrest 02/03/2019  . Acute bilateral cerebral infarction in a watershed distribution [facility]) 01/30/2019  . UGIB (upper gastrointestinal bleed) 01/26/2019  . Bacteremia due to Streptococcus 01/26/2019  . Cellulitis 01/26/2019  . Upper GI bleed   . Acute CVA (cerebrovascular accident) ([place]) 01/19/2019  . Acute renal failure ([place]) 01/14/2019  . Right shoulder pain 01/14/2019  . Sepsis ([place]) 01/13/2019  . S/P TAVR (transcatheter aortic valve replacement) 07/25/2018  . History of subdural hematoma   . Atrial fibrillation, chronic   . Acute on chronic diastolic heart failure ([place]) 06/15/2018  . Pulmonary hypertension, unspecified ([place]) 02/15/2018  . Varicose veins of right lower extremity with complications 83/41/9622  . History of Severe aortic stenosis 08/09/2017  . Vitamin D deficiency 12/02/2016  . Status post gastric bypass for obesity 11/02/2016  . HTN (hypertension) 04/13/2013  . Hyperlipemia 04/13/2013  . Chronic atrial fibrillation 02/14/2013  . Non-ST elevation (NSTEMI) myocardial infarction ([place]) 06/25/2008  . Morbid obesity ([place]) 06/18/2008  . Obstructive sleep apnea 06/18/2008   Past Medical History:  Past Medical History:  Diagnosis Date  . Anemia    low iron  .  Arthritis    bilateral knees  . Atrial fibrillation, chronic   . Chronic diastolic CHF (congestive heart failure) ([place]) 06/15/2018  . History of subdural hematoma   . Hyperlipidemia   . Hypertension   . Morbid obesity ([place])   . Myocardial infarction ([place])   . Normal coronary arteries    by cardiac catheterization performed 03/14/06  . Pre-diabetes    pt denies  . S/P TAVR (transcatheter aortic valve replacement)    a. 07/25/18: Edwards Sapien 3 THV (size 26 mm, model # 9600TFX, serial # [id number]) by Dr. Cyndia Bent and Dr. Angelena Form  . Sleep apnea    on CPAP  . Stroke ([place])   . Venous insufficiency   . VF (ventricular fibrillation) ([place]) causing cardiac arrest 02/03/2019   Past Surgical History:  Past Surgical History:  Procedure Laterality Date  . BIOPSY  01/26/2019   Procedure: BIOPSY;  Surgeon: Ronald Lobo, MD;  Location: [facility] ENDOSCOPY;  Service: Endoscopy;;  . CRANIOTOMY Right 08/27/2016   Procedure: CRANIOTOMY HEMATOMA EVACUATION SUBDURAL;  Surgeon: Erline Levine, MD;  Location: [place];  Service: Neurosurgery;  Laterality: Right;  . ESOPHAGOGASTRODUODENOSCOPY N/A 01/21/2019   Procedure: ESOPHAGOGASTRODUODENOSCOPY (EGD);  Surgeon: Clarene Essex, MD;  Location: [place];  Service: Endoscopy;  Laterality: N/A;  . ESOPHAGOGASTRODUODENOSCOPY (EGD) WITH PROPOFOL N/A 01/22/2019   Procedure: ESOPHAGOGASTRODUODENOSCOPY (EGD) WITH PROPOFOL;  Surgeon: Ronald Lobo, MD;  Location: [place];  Service: Endoscopy;  Laterality: N/A;  . ESOPHAGOGASTRODUODENOSCOPY (EGD) WITH PROPOFOL N/A 01/26/2019   Procedure: ESOPHAGOGASTRODUODENOSCOPY (EGD) WITH PROPOFOL;  Surgeon: Ronald Lobo, MD;  Location: [place];  Service: Endoscopy;  Laterality: N/A;  . GASTRIC BYPASS  09/2008  . JOINT REPLACEMENT  08/31/2006  right hip  . LEFT HEART CATH AND CORONARY ANGIOGRAPHY N/A 02/05/2019   Procedure: LEFT HEART CATH AND CORONARY ANGIOGRAPHY;  Surgeon: Jettie Booze, MD;  Location: [place] [facility];   Service: Cardiovascular;  Laterality: N/A;  . MULTIPLE EXTRACTIONS WITH ALVEOLOPLASTY N/A 07/13/2018   Procedure: Extraction of tooth #'s 3,8,10, 23-26, 30and 32 with alveoloplasty and gross debridement of remaining teeth;  Surgeon: Lenn Cal, DDS;  Location: [place];  Service: Oral Surgery;  Laterality: N/A;  . RIGHT HEART CATH N/A 07/06/2018   Procedure: RIGHT HEART CATH;  Surgeon: Burnell Blanks, MD;  Location: [place] [facility];  Service: Cardiovascular;  Laterality: N/A;  . RIGHT/LEFT HEART CATH AND CORONARY ANGIOGRAPHY N/A 07/10/2018   Procedure: RIGHT/LEFT HEART CATH AND CORONARY ANGIOGRAPHY;  Surgeon: Jolaine Artist, MD;  Location: [place] [facility];  Service: Cardiovascular;  Laterality: N/A;  . TEE WITHOUT CARDIOVERSION N/A 07/25/2018   Procedure: TRANSESOPHAGEAL ECHOCARDIOGRAM (TEE);  Surgeon: Burnell Blanks, MD;  Location: [place];  Service: Open Heart Surgery;  Laterality: N/A;  . TEE WITHOUT CARDIOVERSION N/A 01/18/2019   Procedure: TRANSESOPHAGEAL ECHOCARDIOGRAM (TEE) WITH PROPOFOL;  Surgeon: Satira Sark, MD;  Location: [facility];  Service: Cardiovascular;  Laterality: N/A;  . TOTAL HIP ARTHROPLASTY     right hip  . TRANSCATHETER AORTIC VALVE REPLACEMENT, TRANSFEMORAL  07/25/2018  . TRANSCATHETER AORTIC VALVE REPLACEMENT, TRANSFEMORAL N/A 07/25/2018   Procedure: TRANSCATHETER AORTIC VALVE REPLACEMENT, TRANSFEMORAL;  Surgeon: Burnell Blanks, MD;  Location: [place];  Service: Open Heart Surgery;  Laterality: N/A;    Assessment / Plan / Recommendation Clinical Impression Joel Rogers is a 68 year old right-handed male with history of diastolic congestive heart failure, aortic stenosis with TAVR procedure September 2019, atrial fibrillation maintained on Eliquis, OSA with CPAP, hypertension.  Initially presented to [facility] 01/13/2019 secondary to right upper extremity pain and recent fall with right sided weakness findings of sepsis due to  Staphylococcus bacteremia and cellulitis during admission maintain on antibiotic therapy. MRI the brain revealed multiple acute ischemic infarcts in a pattern most consistent with cardioembolic stroke. He was transferred to [facility] for further evaluation. Follow-up MRI the brain with numerous scattered foci of acute ischemia throughout the cerebrum and cerebellum. He developed coffee-ground emesis with acute blood loss anemia requiring transfusion totaling 6 units pack red blood cells with gastroenterology consulted for EGD completed 2 with findings of red blood in the lower third of the esophagus clotted blood in the entire stomach. Therapy evaluations completed patient was admitted for a comprehensive rehabilitation program 01/30/19.  Patient became unresponsive and code was called and noted to be in ventricular fibrillation. He was discharged to acute care services 02/03/2019 maintain on intravenous heparin and followed by cardiology services. Echocardiogram was repeated with ejection fraction greater than 65% hyperdynamic systolic function. Cardiac catheterization same day showed  Lat Ramus lesion 99% stenosed. Heavy thrombus. Diet has been advanced to regular consistency. Therapies have been resumed patient tolerating nicely and was readmitted back to inpatient rehabilitation services on 02/12/19.   Cognitive linguistic evaluation completed on 02/15/19. Pt presents with moderate cognitive impairments that appear worsened during most recent admission to acute services. Currently, pt with deficits in selective attention, awareness of deficits, basic problem solving, memory (including orientation, ability to follow 2 step basic directions). Skilled ST is required to target this deficits, increase pt's functional independence and reduce caregiver burden. Anticipate that pt will require 24 hour supervision to [place] for  cognitive abilities and follow up ST services at discharge from [place].    Skilled  Therapeutic Interventions          Skilled treatment session focused on completion of cognitive linguistic evaluation, see above. SLP further facilitated session by providing education on plan of care, creation of goals and length of stay.    SLP Assessment  Patient will need skilled [place] [facility] during CIR admission    Recommendations  Recommendations for Other Services: Neuropsych consult Patient destination: Home Follow up Recommendations: 24 hour supervision/assistance;Home Health SLP Equipment Recommended: None recommended by SLP    SLP Frequency 3 to 5 out of 7 days   SLP Duration  SLP Intensity  SLP Treatment/Interventions 4 weeks  Minumum of 1-2 x/day, 30 to 90 minutes  Cognitive remediation/compensation;Medication managment;Functional tasks;Internal/external aids;Environmental controls    Pain Pain Assessment Pain Scale: 0-10 Pain Score: 0-No pain  Prior Functioning Cognitive/Linguistic Baseline: Baseline deficits Baseline deficit details: pt describes mild memory impairments  Type of Home: House  Lives With: Spouse Available Help at Discharge: Family;Available 24 hours/day Vocation: Retired  Industrial/product designer Term Goals: Week 1: SLP Short Term Goal 1 (Week 1): Pt will utilize compensatory and external memory aids to recall, new daily information with Min A verbal cues. SLP Short Term Goal 2 (Week 1): Pt will complete semi-complex tasks with Mod A verbal cues for functional problem solving.  SLP Short Term Goal 3 (Week 1): Pt will demonstrate intellectual awareness by listing 3 physical and 3 cognitive deficits with Min A cues.  SLP Short Term Goal 4 (Week 1): Pt will demonstrate selective attention for ~ 30 minutes in mildly distracting environment with Min A cues.  SLP Short Term Goal 5 (Week 1): Pt will follow 2 step directions with 80% accuracy and Min A cues.   Refer to Care Plan for Long Term Goals  Recommendations for other services:  Neuropsych  Discharge Criteria: Patient will be discharged from SLP if patient refuses treatment 3 consecutive times without medical reason, if treatment goals not met, if there is a change in medical status, if patient makes no progress towards goals or if patient is discharged from hospital.  The above assessment, treatment plan, treatment alternatives and goals were discussed and mutually agreed upon: by patient  Meric Joye 02/15/2019, 11:41 AM

## 2019-02-15 NOTE — Progress Notes (Signed)
Patient received in bed this evening. No c/o pain or discomfort. He has not asked for any pain medications during this shift as yet. Medications were given as ordered & tolerated well. He was noted to be awake more often than last night, but stated that he felt good. He called the desk stating that he was itchy, but when this nurse & NT went to his room, he did not want his skin to be checked or for him to be turned. We waited previously due to patient complaining about how much pain it is to turn & how often he is turned. Patient was educated about the need to be turned & brief checked. Last night, when checked, patient was dry but has a small amount of feces. Area was cleansed & sacral dressing was removed to assess pressure ulcer. Tonight, it was the same. He had a small amount of feces on him, which was dark & tarry. There was no dressing on him. Another dressing was placed after area cleansed with normal saline. After procedure, patient was in no respiratory distress. Call bell is within reach, will continue to monitor.

## 2019-02-15 NOTE — Progress Notes (Signed)
Occupational Therapy Session Note  Patient Details  Name: Joel Rogers MRN: [id number] Date of Birth: Jun 28, 1951  Today's Date: 02/15/2019 OT Individual Time: 1330-1443 OT Individual Time Calculation (min): 73 min    Short Term Goals: Week 1:  OT Short Term Goal 1 (Week 1): Pt will complete sit > stand with max assist +2 to decrease burden of care for LB bathing/dressing OT Short Term Goal 2 (Week 1): Pt will complete UB dressing with mod assist OT Short Term Goal 3 (Week 1): Pt will complete toilet transfer with max assist +2 OT Short Term Goal 4 (Week 1): Pt will complete bathing with mod assist from bed level.    Skilled Therapeutic Interventions/Progress Updates:    Upon entering the room, pt supine in bed with no c/o pain and agreeable to OT intervention. Pt declined use of STEDY secondary to fatigue. Pt rolling L <> R with mod A and use of bed rails to place maxi sling. Maxi sling utilized to transfer pt into wheelchair. OT discussed OT goals and pt's goals and pt agreeing it best to be placed on night bath for therapy to be able to address other deficits at this time. Pt doffing shirt with min cuing and increased time. Pt needing mod A and min verbal cuing for hemiplegic technique to don clean shirt. Pt needing assistance to pull down trunk and fully up R UE. OT assisted pt via wheelchair to gym for seated dynavision task with focus on anterior weight shift and R UE NMR. Pt needing AAROM to hit targets on R side of board with R UE against gravity. Pt's R UE was approximately 1 second slower for average reaction time than L UE. Pt returning to room by pushing wheelchair 75' with min A and mod multimodal cuing for hand placement and technique. Pt remained in wheelchair at end of session with call bell and all needed items within reach.   Therapy Documentation Precautions:  Precautions Precautions: Fall Restrictions Weight Bearing Restrictions: No General:   Vital Signs: Therapy  Vitals Pulse Rate: 77 Resp: 18 BP: 115/84 Patient Position (if appropriate): Sitting Oxygen Therapy SpO2: 100 % O2 Device: Room Air Pain:   ADL: ADL Upper Body Bathing: Maximal assistance Where Assessed-Upper Body Bathing: Bed level Lower Body Bathing: Dependent Where Assessed-Lower Body Bathing: Bed level Upper Body Dressing: Maximal assistance Where Assessed-Upper Body Dressing: Bed level Lower Body Dressing: Dependent Where Assessed-Lower Body Dressing: Bed level Vision   Perception    Praxis   Exercises:   Other Treatments:     Therapy/Group: Individual Therapy  [name] 02/15/2019, 4:13 PM

## 2019-02-15 NOTE — Progress Notes (Signed)
Physical Therapy Session Note  Patient Details  Name: Joel Rogers MRN: [id number] Date of Birth: 03/08/1951  Today's Date: 02/15/2019 PT Individual Time: 1031-1130 PT Individual Time Calculation (min): 59 min   Short Term Goals: Week 1:  PT Short Term Goal 1 (Week 1): Pt will transfer bed<>w/c with max assist +1 using LRAD. PT Short Term Goal 2 (Week 1): Pt will complete bed mobility with mod assist +1. PT Short Term Goal 3 (Week 1): Pt will propel w/c 25 ft with min assist.   Skilled Therapeutic Interventions/Progress Updates:   Pt received sitting in recliner and agreeable to PT. Sit>stand in stedy from recliner with max assist +2. Sit<>stand x 3 in stedy with max assist of 1. 2 bouts of standing 15 sec and 30 sec in stedy with mod assist and max cues for posture and glute activation. Attempted 3rd bout but pt unable to maintain and seated in WC. Pt transported to rehab gym. PT applied KT tape to R UE for edema management to the wrist and forearm. Sit<>stand in parallel bars with max assist of 1. PT blocked the RLE and provided mod-max cues for UE placement, posture, and improved trunk control. Pt returned to room and performed stedy transfer to bed with max assist + 2 from [facility]. Sit>supine completed with total assist + 2, and left supine in bed with call bell in reach and all needs met.        Therapy Documentation Precautions:  Precautions Precautions: Fall Restrictions Weight Bearing Restrictions: No Pain: Pain Assessment Pain Scale: 0-10 Pain Score: 0-No pain   Therapy/Group: Individual Therapy  Lorie Phenix 02/15/2019, 12:10 PM

## 2019-02-15 NOTE — Progress Notes (Signed)
[facility] PHYSICAL MEDICINE & REHABILITATION PROGRESS NOTE   Subjective/Complaints:  Some dark tarry stool noted by RN, no epigastreic pain, still has rib pain  ROS- denies SOB, bowel or bladder issues chest is sore at lower ribs   Objective:   No results found. Recent Labs    02/13/19 0511  WBC 14.9*  HGB 8.0*  HCT 25.4*  PLT 228   Recent Labs    02/13/19 0511  NA 141  K 3.9  CL 107  CO2 25  GLUCOSE 95  BUN 18  CREATININE 0.84  CALCIUM 8.2*    Intake/Output Summary (Last 24 hours) at 02/15/2019 0834 Last data filed at 02/15/2019 0810 Gross per 24 hour  Intake 240 ml  Output 800 ml  Net -560 ml     Physical Exam: Vital Signs Blood pressure 119/73, pulse 76, temperature 98 F (36.7 C), temperature source Oral, resp. rate 18, weight 117.3 kg, SpO2 99 %.  General: No acute distress Mood and affect are appropriate Heart: Regular rate and rhythm no rubs murmurs or extra sounds Chest tenderness to palpation sterno xiphoid junction Lungs: Clear to auscultation, breathing unlabored, no rales or wheezes Abdomen: Positive bowel sounds, soft nontender to palpation, nondistended, no epigastric tenderness Extremities:2+ edema RUE, 2+ edema RLE Skin: No evidence of breakdown, no evidence of rash Neurologic: Cranial nerves II through XII intact, motor strength is 5/5 in left and 3- RIght  deltoid, bicep, tricep, grip, hip flexor,2- right and 3- left knee extensors, ankle dorsiflexor and plantar flexor- unchanged   Musculoskeletal: no extremity pain with muscle testing    Assessment/Plan: 1. Functional deficits secondary to cardioembolic bicerebral infarcts which require 3+ hours per day of interdisciplinary therapy in a comprehensive inpatient rehab setting.  Physiatrist is providing close team supervision and 24 hour management of active medical problems listed below.  Physiatrist and rehab team continue to assess barriers to discharge/monitor patient progress  toward functional and medical goals  Care Tool:  Bathing    Body parts bathed by patient: Chest, Abdomen, Left upper leg, Face   Body parts bathed by helper: Right arm, Left arm, Front perineal area, Buttocks, Right upper leg, Left lower leg, Right lower leg     Bathing assist Assist Level: Maximal Assistance - Patient 24 - 49%(bed level)     Upper Body Dressing/Undressing Upper body dressing   What is the patient wearing?: Hospital gown only    Upper body assist Assist Level: Maximal Assistance - Patient 25 - 49%    Lower Body Dressing/Undressing Lower body dressing      What is the patient wearing?: Incontinence brief     Lower body assist Assist for lower body dressing: Total Assistance - Patient < 25%     Editor, commissioning assist Assist for toileting: 2 Helpers Assistive Device Comment: BSC(BSC)   Transfers Chair/bed transfer  Transfers assist  Chair/bed transfer activity did not occur: Safety/medical concerns  Chair/bed transfer assist level: 2 Helpers     Locomotion Ambulation   Ambulation assist   Ambulation activity did not occur: Safety/medical concerns          Walk 10 feet activity   Assist  Walk 10 feet activity did not occur: Safety/medical concerns        Walk 50 feet activity   Assist Walk 50 feet with 2 turns activity did not occur: Safety/medical concerns         Walk 150 feet activity   Assist Walk 150  feet activity did not occur: Safety/medical concerns         Walk 10 feet on uneven surface  activity   Assist Walk 10 feet on uneven surfaces activity did not occur: Safety/medical concerns         Wheelchair     Assist Will patient use wheelchair at discharge?: Yes Type of Wheelchair: Manual    Wheelchair assist level: Moderate Assistance - Patient 50 - 74% Max wheelchair distance: 50    Wheelchair 50 feet with 2 turns activity    Assist    Wheelchair 50 feet with 2 turns  activity did not occur: Safety/medical concerns(limited by fatigue)   Assist Level: Moderate Assistance - Patient 50 - 74%   Wheelchair 150 feet activity     Assist Wheelchair 150 feet activity did not occur: Safety/medical concerns          Medical Problem List and Plan: 1.Persistent right side weakness with decreased mobilitysecondary to cardioembolic bi-cerebral infarction CIR PT, OT, SLP 2. Antithrombotics: -DVT/anticoagulation:ELIQUIS -antiplatelet therapy:  3. Pain Management:Ultracet as needed 4. Mood:Provide emotional support -antipsychotic agents: none 5. Neuropsych: This patientiscapable of making decisions on hisown behalf. 6. Skin/Wound Care:Routine skin checks 7. Fluids/Electrolytes/Nutrition:Routine in and out's with follow-up chemistries 8. Upper GI bleed EGD 3. Follow-up gastrin neurology services. Continue PPI -serial H/H's while on eliquis 9. Acute blood loss anemia.Recent GI bleed expect tarry stools  Patient transfused throughout hospital course. Follow-up CBC, hgb has been 7.5-8 g Stable 3/25 10. Chronic atrial fibrillation with TAVR procedure September 2019- Rate controlled 3/25 11. Non-STEMI. Thought secondary to thrombus status post cardiac catheterization. Continue eliquis 12. Acute on chronic diastolic congestive heart failure. Lasix 40 mg daily -daily weights stable Filed Weights   02/13/19 0602 02/14/19 0544 02/15/19 0555  Weight: 117.2 kg 117.2 kg 117.3 kg  13. Hypertension. Norvasc 5 mg daily, Lopressor 50 mg twice a day. Monitor with increased mobility Vitals:   02/14/19 1943 02/15/19 0555  BP: 124/83 119/73  Pulse: 85 76  Resp: 18 18  Temp: 98.2 F (36.8 C) 98 F (36.7 C)  SpO2: 100% 99%  Controlled 3/26 14. Hyperlipidemia. Lipitor  LOS: 3 days A FACE TO FACE EVALUATION WAS PERFORMED  Erick Colace 02/15/2019, 8:34 AM

## 2019-02-15 NOTE — Progress Notes (Signed)
Physical Therapy Session Note  Patient Details  Name: Joel Rogers MRN: [id number] Date of Birth: [birth year]  Today's Date: 02/15/2019 PT Individual Time: 0830-0900 PT Individual Time Calculation (min): 30 min   Short Term Goals: Week 1:  PT Short Term Goal 1 (Week 1): Pt will transfer bed<>w/c with max assist +1 using LRAD. PT Short Term Goal 2 (Week 1): Pt will complete bed mobility with mod assist +1. PT Short Term Goal 3 (Week 1): Pt will propel w/c 25 ft with min assist.   Skilled Therapeutic Interventions/Progress Updates:    Patient in supine and agreeable to PT.  Noted with only brief on in bed and requesting OOB.  Looped legs into pants and pulled up in supine then assisted with increased time, with bed rail and mod A to roll to don pants total A.  TED's and slipper socks donned total A.  Rolling and side to sit max A.  Patient scooted into drop arm recliner with max A using bed pad to assist with scooting and max cues for anterior weight shift and technique. Left in recliner with belt alarm and all needs in reach.  Therapy Documentation Precautions:  Precautions Precautions: Fall Restrictions Weight Bearing Restrictions: No Pain: Pain Assessment Faces Pain Scale: Hurts a little bit Pain Type: Acute pain Pain Location: Rib cage Pain Descriptors / Indicators: Aching Pain Onset: With Activity Pain Intervention(s): Repositioned    Therapy/Group: Individual Therapy  Elray Mcgregor  [place], PT 02/15/2019, 4:15 PM

## 2019-02-15 NOTE — Plan of Care (Signed)
  Problem: RH BLADDER ELIMINATION Goal: RH STG MANAGE BLADDER WITH ASSISTANCE Description STG Manage Bladder With max Assistance  Outcome: Not Progressing; incontinence on CC.   Problem: RH PAIN MANAGEMENT Goal: RH STG PAIN MANAGED AT OR BELOW PT'S PAIN GOAL Description At or below level 4  Outcome: Not Progressing; c/o pain chest area due to past compression

## 2019-02-15 NOTE — Progress Notes (Signed)
Social Work Patient ID: Joel Rogers, male   DOB: July 29, 1951, 68 y.o.   MRN: [id number] Met with pt and spoke with wife via telephone to discuss the team conference goals-min assist wheelchair level and target discharge 4/21. Both are hopeful he will do better than the therapists think, pt wants to walk before going home. Aware if progresses with transfers than therapy will make ambulation goals. Wife plans to take care of pt at discharge, she has taken care of her Mom for years before her death. Will continue to work on discharge needs and provide support to pt and wife.

## 2019-02-16 ENCOUNTER — Inpatient Hospital Stay (HOSPITAL_COMMUNITY): Payer: Self-pay | Admitting: Occupational Therapy

## 2019-02-16 ENCOUNTER — Inpatient Hospital Stay (HOSPITAL_COMMUNITY): Payer: Self-pay | Admitting: Speech Pathology

## 2019-02-16 NOTE — Progress Notes (Signed)
Physical Therapy Session Note  Patient Details  Name: Joel Rogers MRN: [id number] Date of Birth: 1951/05/26  Today's Date: 02/16/2019 PT Individual Time: 1150-1230 PT Individual Time Calculation (min): 40 min   Short Term Goals: Week 1:  PT Short Term Goal 1 (Week 1): Pt will transfer bed<>w/c with max assist +1 using LRAD. PT Short Term Goal 2 (Week 1): Pt will complete bed mobility with mod assist +1. PT Short Term Goal 3 (Week 1): Pt will propel w/c 25 ft with min assist.   Skilled Therapeutic Interventions/Progress Updates:   Pt received sitting in WC and agreeable to PT  Pt transported to rehab gym in [facility]. Sit<>stnad from [facility] in parallel bars x 3. Max assist +2 for first 2 transfers and mod assist +2 on third. Pt reports pain in L side at site of rib fx limiting increased assist from Pt. PT provided increased assist to trunk support to reduce need for LUE to pull into standing. Pt performed 1 step with BLE while standing in parallel bars x 2, before requiring seated rest break due to fatigue in LE. No R knee collapse noted by PT with brief SLS.   WC mobility training x 67f with min assist from PT and moderate cues for attention to the RUE and increased posterior reach to improve stroke length on the R.   Kinetron seated reciprocal movement training 4 x45 sec, 35cm/min. Cues for improved symmetry of movement and increased ROM to force increased activation and attention to the RLE. [facility] and all needs met.        Therapy Documentation Precautions:  Precautions Precautions: Fall Restrictions Weight Bearing Restrictions: No Pain: 0/10    Therapy/Group: Individual Therapy  ALorie Phenix3/27/2020, 12:59 PM

## 2019-02-16 NOTE — Progress Notes (Signed)
Speech Language Pathology Daily Session Note  Patient Details  Name: Joel Rogers MRN: [id number] Date of Birth: Feb 09, 1951  Today's Date: 02/16/2019 SLP Individual Time: 1305-1400 SLP Individual Time Calculation (min): 55 min  Short Term Goals: Week 1: SLP Short Term Goal 1 (Week 1): Pt will utilize compensatory and external memory aids to recall, new daily information with Min A verbal cues. SLP Short Term Goal 2 (Week 1): Pt will complete semi-complex tasks with Mod A verbal cues for functional problem solving.  SLP Short Term Goal 3 (Week 1): Pt will demonstrate intellectual awareness by listing 3 physical and 3 cognitive deficits with Min A cues.  SLP Short Term Goal 4 (Week 1): Pt will demonstrate selective attention for ~ 30 minutes in mildly distracting environment with Min A cues.  SLP Short Term Goal 5 (Week 1): Pt will follow 2 step directions with 80% accuracy and Min A cues.   Skilled Therapeutic Interventions:  Pt was seen for skilled ST targeting cognitive goals.  Pt was received upright in wheelchair eating lunch.  Pt could selectively attend to meal in the presence of mild distractions for ~15 minute with min cues for redirection.  Pt could recall activities from previous therapy sessions and could identify at least 1 physical deficits post CVA during functional conversations with SLP with min question cues.  Pt was off by 3 days for orientation to exact date and reports that the days sometimes "get mixed up" since being hospitalized.  Therefore, SLP provided pt with calendars for the next 2 months to maximize carryover of orientation information.  Pt was left in wheelchair with chair alarm set and call bell within reach.  Continue per current plan of care.    Pain Pain Assessment Pain Scale: 0-10 Pain Score: 0-No pain  Therapy/Group: Individual Therapy  Kennedey Digilio, Melanee Spry 02/16/2019, 3:44 PM

## 2019-02-16 NOTE — Progress Notes (Signed)
[facility] PHYSICAL MEDICINE & REHABILITATION PROGRESS NOTE   Subjective/Complaints:  No issues overnite, "sore from working out"  ROS- denies SOB, bowel or bladder issues chest is sore at lower ribs   Objective:   No results found. No results for input(s): WBC, HGB, HCT, PLT in the last 72 hours. No results for input(s): NA, K, CL, CO2, GLUCOSE, BUN, CREATININE, CALCIUM in the last 72 hours.  Intake/Output Summary (Last 24 hours) at 02/16/2019 0817 Last data filed at 02/16/2019 0803 Gross per 24 hour  Intake 600 ml  Output 1500 ml  Net -900 ml     Physical Exam: Vital Signs Blood pressure 122/82, pulse 81, temperature 98.3 F (36.8 C), temperature source Oral, resp. rate 17, weight 117.3 kg, SpO2 100 %.  General: No acute distress Mood and affect are appropriate Heart: Regular rate and rhythm no rubs murmurs or extra sounds Chest tenderness to palpation sterno xiphoid junction Lungs: Clear to auscultation, breathing unlabored, no rales or wheezes Abdomen: Positive bowel sounds, soft nontender to palpation, nondistended, no epigastric tenderness Extremities:2+ edema RUE, 2+ edema RLE Skin: No evidence of breakdown, no evidence of rash Neurologic: Cranial nerves II through XII intact, motor strength is 5/5 in left and 3- RIght  deltoid, bicep, tricep, grip, hip flexor,2- right and 3- left knee extensors, ankle dorsiflexor and plantar flexor- unchanged   Musculoskeletal: no extremity pain with muscle testing    Assessment/Plan: 1. Functional deficits secondary to cardioembolic bicerebral infarcts which require 3+ hours per day of interdisciplinary therapy in a comprehensive inpatient rehab setting.  Physiatrist is providing close team supervision and 24 hour management of active medical problems listed below.  Physiatrist and rehab team continue to assess barriers to discharge/monitor patient progress toward functional and medical goals  Care Tool:  Bathing    Body  parts bathed by patient: Chest, Abdomen, Left upper leg, Face   Body parts bathed by helper: Right arm, Left arm, Front perineal area, Buttocks, Right upper leg, Left lower leg, Right lower leg     Bathing assist Assist Level: Maximal Assistance - Patient 24 - 49%(bed level)     Upper Body Dressing/Undressing Upper body dressing   What is the patient wearing?: Hospital gown only    Upper body assist Assist Level: Maximal Assistance - Patient 25 - 49%    Lower Body Dressing/Undressing Lower body dressing      What is the patient wearing?: Pants, Incontinence brief     Lower body assist Assist for lower body dressing: Total Assistance - Patient < 25%     Editor, commissioning assist Assist for toileting: 2 Helpers Assistive Device Comment: BSC(BSC)   Transfers Chair/bed transfer  Transfers assist  Chair/bed transfer activity did not occur: Safety/medical concerns  Chair/bed transfer assist level: Dependent - mechanical lift     Locomotion Ambulation   Ambulation assist   Ambulation activity did not occur: Safety/medical concerns          Walk 10 feet activity   Assist  Walk 10 feet activity did not occur: Safety/medical concerns        Walk 50 feet activity   Assist Walk 50 feet with 2 turns activity did not occur: Safety/medical concerns         Walk 150 feet activity   Assist Walk 150 feet activity did not occur: Safety/medical concerns         Walk 10 feet on uneven surface  activity   Assist Walk 10  feet on uneven surfaces activity did not occur: Safety/medical concerns         Wheelchair     Assist Will patient use wheelchair at discharge?: Yes Type of Wheelchair: Manual    Wheelchair assist level: Moderate Assistance - Patient 50 - 74% Max wheelchair distance: 50    Wheelchair 50 feet with 2 turns activity    Assist    Wheelchair 50 feet with 2 turns activity did not occur: Safety/medical  concerns(limited by fatigue)   Assist Level: Moderate Assistance - Patient 50 - 74%   Wheelchair 150 feet activity     Assist Wheelchair 150 feet activity did not occur: Safety/medical concerns          Medical Problem List and Plan: 1.Persistent right side weakness with decreased mobilitysecondary to cardioembolic bi-cerebral infarction CIR PT, OT, SLP 2. Antithrombotics: -DVT/anticoagulation:ELIQUIS -antiplatelet therapy:  3. Pain Management:Ultracet as needed 4. Mood:Provide emotional support -antipsychotic agents: none 5. Neuropsych: This patientiscapable of making decisions on hisown behalf. 6. Skin/Wound Care:Routine skin checks 7. Fluids/Electrolytes/Nutrition:Routine in and out's with follow-up chemistries 8. Upper GI bleed EGD 3. Follow-up gastrin neurology services. Continue PPI -serial H/H's while on eliquis 9. Acute blood loss anemia.Recent GI bleed expect tarry stools  Patient transfused throughout hospital course. Follow-up CBC, hgb has been 7.5-8 g Stable 3/25 10. Chronic atrial fibrillation with TAVR procedure September 2019- Rate controlled 3/27 11. Non-STEMI. Thought secondary to thrombus status post cardiac catheterization. Continue eliquis 12. Acute on chronic diastolic congestive heart failure. Lasix 40 mg daily -daily weights stable Filed Weights   02/14/19 0544 02/15/19 0555 02/16/19 0500  Weight: 117.2 kg 117.3 kg 117.3 kg  13. Hypertension. Norvasc 5 mg daily, Lopressor 50 mg twice a day. Monitor with increased mobility Vitals:   02/16/19 0536 02/16/19 0804  BP: 105/67 122/82  Pulse: 68 81  Resp: 17   Temp: 98.3 F (36.8 C)   SpO2: 100%   Controlled 3/27 14. Hyperlipidemia. Lipitor  LOS: 4 days A FACE TO FACE EVALUATION WAS PERFORMED  Erick Colace 02/16/2019, 8:17 AM

## 2019-02-16 NOTE — Progress Notes (Signed)
Occupational Therapy Session Note  Patient Details  Name: Joel Rogers MRN: [id number] Date of Birth: Feb 28, 1951  Today's Date: 02/16/2019 OT Individual Time: 7494-4967 and 1520-1550 OT Individual Time Calculation (min): 74 min and 30 min  Short Term Goals: Week 1:  OT Short Term Goal 1 (Week 1): Pt will complete sit > stand with max assist +2 to decrease burden of care for LB bathing/dressing OT Short Term Goal 2 (Week 1): Pt will complete UB dressing with mod assist OT Short Term Goal 3 (Week 1): Pt will complete toilet transfer with max assist +2 OT Short Term Goal 4 (Week 1): Pt will complete bathing with mod assist from bed level.    Skilled Therapeutic Interventions/Progress Updates:    Pt greeted in bed, wanting to put on pants. With OT stabilizing R LE, pt able to bridge a little to elevate pants over thighs using both Lt and Rt UEs. To completely elevate pants, pt rolled towards Lt (Max A) and Rt (Mod A) with manual facilitation for Rt hand placement on bedrails. He rolled in similar fashion for Maxi sling placement. Pt reported feeling too sore in ribs and back to use Stedy this AM. Dependent transfer to w/c using lift. While seated at sink, worked on trunk control and Rt NMR via oral care and grooming tasks. Pt able to weight shift anterioraly towards sink with Min A 10% of the time due to rib pain. 90% of the time, he was able to do so himself to retrieve items and manage faucet levers while pushing through Lt arm using armrest. Vcs required for activating core muscles for strengthening. Pt able to crawl Rt UE along sink to retrieve items on Rt side. Active assist for reaching towards Lt for faucet lever and soap as needed using R UE. Pt able to lift Rt limb to mouth to wash face and use toothbrush. He had a tough time manipulating toothbrush in mouth, and so needed to use Lt UE to assist. After handwashing, pt was placed at bedside with R UE elevated on lap tray for edema mgt.  Encouraged movement of limb to further decrease swelling as well. Pt left with safety belt fastened and NT assisting him with telephone.      2nd Session 1:1 tx (30 min) Pt greeted in w/c, amenable to tx. Worked on trunk control and Rt NMR via meaningful IADL task of linen folding. 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 OT provided proximal support/assist at elbow due to weakness and shoulder subluxation (1 finger width). He required vcs for self organizing and problem solving. Able to grip wash cloth corners with Rt and incorporate limb functionally when folding cloths into squares or triangles. Increased difficultly when folding towels due to increased proximal and trunk strength demands. Supervision for anterior weight shifting with pt c/o increased rib pain. At end of session pt was left in care of RN for receiving pain medication. Safety belt fastened, R UE elevated, and call bell in hand.   Therapy Documentation Precautions:  Precautions Precautions: Fall Restrictions Weight Bearing Restrictions: No Vital Signs: Therapy Vitals Temp: 98.7 F (37.1 C) Temp Source: Oral Pulse Rate: 86 Resp: 20 BP: 111/64 Patient Position (if appropriate): Sitting Oxygen Therapy SpO2: 100 % O2 Device: Room Air Pain: In ribs and back. Per pt, premedicated at start of 1st session.  Pain Assessment Pain Scale: 0-10 Pain Score: 7  ADL: ADL Upper Body Bathing: Maximal assistance Where Assessed-Upper Body Bathing: Bed level Lower  Body Bathing: Dependent Where Assessed-Lower Body Bathing: Bed level Upper Body Dressing: Maximal assistance Where Assessed-Upper Body Dressing: Bed level Lower Body Dressing: Dependent Where Assessed-Lower Body Dressing: Bed level      Therapy/Group: Individual Therapy  Remigio Mcmillon A Oksana Deberry 02/16/2019, 4:01 PM

## 2019-02-16 NOTE — Plan of Care (Signed)
  Problem: Consults Goal: RH STROKE PATIENT EDUCATION Description See Patient Education module for education specifics  Outcome: Progressing   Problem: RH BOWEL ELIMINATION Goal: RH STG MANAGE BOWEL WITH ASSISTANCE Description STG Manage Bowel with max Assistance.  Outcome: Progressing   Problem: RH SAFETY Goal: RH STG ADHERE TO SAFETY PRECAUTIONS W/ASSISTANCE/DEVICE Description STG Adhere to Safety Precautions With cues/reminders Assistance/Device.  Outcome: Progressing   Problem: RH PAIN MANAGEMENT Goal: RH STG PAIN MANAGED AT OR BELOW PT'S PAIN GOAL Description At or below level 4  Outcome: Progressing

## 2019-02-17 ENCOUNTER — Inpatient Hospital Stay (HOSPITAL_COMMUNITY): Payer: Self-pay | Admitting: Occupational Therapy

## 2019-02-17 ENCOUNTER — Inpatient Hospital Stay (HOSPITAL_COMMUNITY): Payer: Self-pay

## 2019-02-17 ENCOUNTER — Inpatient Hospital Stay (HOSPITAL_COMMUNITY): Payer: Self-pay | Admitting: Physical Therapy

## 2019-02-17 NOTE — Plan of Care (Signed)
  Problem: RH BLADDER ELIMINATION Goal: RH STG MANAGE BLADDER WITH ASSISTANCE Description STG Manage Bladder With max Assistance  Outcome: Not Progressing; incontinence ; condom cath

## 2019-02-17 NOTE — Progress Notes (Signed)
Physical Therapy Session Note  Patient Details  Name: Joel Rogers MRN: [id number] Date of Birth: 1951/02/10  Today's Date: 02/17/2019 PT Individual Time: 1420-1502 PT Individual Time Calculation (min): 42 min   Short Term Goals: Week 1:  PT Short Term Goal 1 (Week 1): Pt will transfer bed<>w/c with max assist +1 using LRAD. PT Short Term Goal 2 (Week 1): Pt will complete bed mobility with mod assist +1. PT Short Term Goal 3 (Week 1): Pt will propel w/c 25 ft with min assist.   Skilled Therapeutic Interventions/Progress Updates:   Pt received supine in bed and agreeable to PT. Supine>sit transfer with max assist and moderate cues for use of RLE and RUE.   Sitting balance EOB with supervision assist from PT while therapist donned shoes. Improved ROM in the R knee noted. Sit>stand with bed in stedy with max assist +2 for safety and to boost to full standing. Semi stand to full standing in stedy x 1 with mod assist of 1. Standing tolerance in stedy x 47seconds with min assist for improved trunk and hip extension. Pt performed WC mobility with min assist x 149f; moderate cues for improved  Attention to the RUE to maintain straight path. Seated BLE NMR. LAQ x 12 with 5 sec hold, hip abduction x 12 with 3 sec hold. Attempted press up x 6 with with BUE and cues for full forward weight shifting. [facility] and all needs met.         Therapy Documentation Precautions:  Precautions Precautions: Fall Restrictions Weight Bearing Restrictions: No    Vital Signs: Therapy Vitals Temp: 98.4 F (36.9 C) Pulse Rate: 83 Resp: 19 BP: 112/71 Patient Position (if appropriate): Sitting Oxygen Therapy SpO2: 100 % O2 Device: Room Air Pain: Pain Assessment Pain Score: 0-No pain    Therapy/Group: Individual Therapy  ALorie Phenix3/28/2020, 3:28 PM

## 2019-02-17 NOTE — Progress Notes (Signed)
Speech Language Pathology Daily Session Note  Patient Details  Name: Joel Rogers MRN: [id number] Date of Birth: Sep 13, 1951  Today's Date: 02/17/2019 SLP Individual Time: 0900-1000 SLP Individual Time Calculation (min): 60 min  Short Term Goals: Week 1: SLP Short Term Goal 1 (Week 1): Pt will utilize compensatory and external memory aids to recall, new daily information with Min A verbal cues. SLP Short Term Goal 2 (Week 1): Pt will complete semi-complex tasks with Mod A verbal cues for functional problem solving.  SLP Short Term Goal 3 (Week 1): Pt will demonstrate intellectual awareness by listing 3 physical and 3 cognitive deficits with Min A cues.  SLP Short Term Goal 4 (Week 1): Pt will demonstrate selective attention for ~ 30 minutes in mildly distracting environment with Min A cues.  SLP Short Term Goal 5 (Week 1): Pt will follow 2 step directions with 80% accuracy and Min A cues.   Skilled Therapeutic Interventions: Skilled ST services focused on cognitive skills. SLP facilitated orientation to situation, place and time, pt required verbal cues to utilize external aid for date only. SLP facilitated basic and semi-complex problem solving skills utilizing money management (counting/making change), pt required min A verbal cues, with written aid to assist in working memory. SLP facilitated intellectual awareness, provided image of where CVA occurred, after pt stated "im not conviced I had a stroke," pt agreeable following education. Pt required mod A verbal cues to name 2 cognitive and 2 physical deficits. SLP facilitated basic problem solving and recall with external aid utilizing card task, pt required max A fade to mod A verbal cues (halfway through task) for application of rules, problem solving and supervision A verbal cues for recall of three rules with external aid. Pt was left in room with call bell within reach and bed alarm set. ST recommends to continue skilled ST services.       Pain Pain Assessment Pain Score: 0-No pain  Therapy/Group: Individual Therapy    [name]  [facility] 02/17/2019, 1:12 PM

## 2019-02-17 NOTE — Progress Notes (Signed)
Occupational Therapy Session Note  Patient Details  Name: Joel Rogers MRN: [id number] Date of Birth: [birth year]  Today's Date: 02/17/2019 OT Individual Time: 1100-1145 and 1530-1600 OT Individual Time Calculation (min): 45 min  and 30 mins  Today's Date: 02/17/2019 OT Missed Time: 15 Minutes and Missed Time Reason: Nursing care   Short Term Goals: Week 1:  OT Short Term Goal 1 (Week 1): Pt will complete sit > stand with max assist +2 to decrease burden of care for LB bathing/dressing OT Short Term Goal 2 (Week 1): Pt will complete UB dressing with mod assist OT Short Term Goal 3 (Week 1): Pt will complete toilet transfer with max assist +2 OT Short Term Goal 4 (Week 1): Pt will complete bathing with mod assist from bed level.    Skilled Therapeutic Interventions/Progress Updates:    Session 1: Upon entering the room, pt supine in bed with no c/o pain. Pt rolling R with max A for placement of maxi sling. However, pt's clothing and bed saturated in urine and pt was unaware. RN arrived to assist with +2 for clothing management and hygiene for time management. Pt also began having bowel movement and placed onto bed pan with max A. Pt then verbalized, " yes, now I feel like I need to go." Pt removed from bed pan and cleaned with total A. Pt began having another bowel movement before brief donned and placed back on bed pan to attempt to empty. Call bell placed in pt's hand and RN present in the room. Bed alarm activated and bed lowered for safety.   Session 2: Upon entering the room, pt seated in wheelchair with no c/o pain. Pt eating snack and OT encouraged pt to feed self with R UE. Pt did much better with "finger foods" this session and bring items to mouth without drops occurring. Decreased edema in R UE as well and pt able to do opposition to all digits. Pt attempting to do finger isolation exercises with difficulty utilizing 4th digit. OT provided pt with built up handle for utensil to use  during dinner. Pt remained in wheelchair with call bell and all needed items within reach upon exiting the room.   Therapy Documentation Precautions:  Precautions Precautions: Fall Restrictions Weight Bearing Restrictions: No General: General OT Amount of Missed Time: 15 Minutes   ADL: ADL Upper Body Bathing: Maximal assistance Where Assessed-Upper Body Bathing: Bed level Lower Body Bathing: Dependent Where Assessed-Lower Body Bathing: Bed level Upper Body Dressing: Maximal assistance Where Assessed-Upper Body Dressing: Bed level Lower Body Dressing: Dependent Where Assessed-Lower Body Dressing: Bed level   Therapy/Group: Individual Therapy  [name] 02/17/2019, 12:08 PM

## 2019-02-18 ENCOUNTER — Inpatient Hospital Stay (HOSPITAL_COMMUNITY): Payer: Self-pay

## 2019-02-18 ENCOUNTER — Inpatient Hospital Stay (HOSPITAL_COMMUNITY): Payer: Self-pay | Admitting: Physical Therapy

## 2019-02-18 ENCOUNTER — Inpatient Hospital Stay (HOSPITAL_COMMUNITY): Payer: Self-pay | Admitting: Occupational Therapy

## 2019-02-18 NOTE — Progress Notes (Signed)
RN was called in room noted a small blood clot in patient's urine and noted a small laceration on the side of his penis. Continued to monitor.

## 2019-02-18 NOTE — Progress Notes (Signed)
Physical Therapy Session Note  Patient Details  Name: Joel Rogers MRN: [id number] Date of Birth: 1951/03/03  Today's Date: 02/18/2019 PT Individual Time: 1022-1102 PT Individual Time Calculation (min): 40 min   Short Term Goals: Week 1:  PT Short Term Goal 1 (Week 1): Pt will transfer bed<>w/c with max assist +1 using LRAD. PT Short Term Goal 2 (Week 1): Pt will complete bed mobility with mod assist +1. PT Short Term Goal 3 (Week 1): Pt will propel w/c 25 ft with min assist.   Skilled Therapeutic Interventions/Progress Updates:  Pt received in w/c & agreeable to tx. Pt reports soreness "all over" 5/10 but declines requesting pain medication. Transported pt to gym via w/c dependent assist. Pt reports he's "not used to doing anything on Sunday's and he's having a mental block" with therapist providing encouragement. Pt scooted forward in w/c seat to place B knees on edge of stedy plate and attempted sit>stand x 2 with therapist only with pt unable to initiate sit>stand, attempted sit>stand with therapist and tech with pt able to initiate movement but unable to clear buttocks. Pt utilized cybex kinetron from w/c level with task focusing on BLE strengthening & NMR. Pt propelled w/c with BUE with cuing to attend to RUE. Pt requires min assist for linear path and max assist to complete turns. Assisted pt with using urinal from w/c level and pt with continent void. Pt left sitting in w/c with chair alarm donned & all needs in reach.   Therapy Documentation Precautions:  Precautions Precautions: Fall Restrictions Weight Bearing Restrictions: No    Therapy/Group: Individual Therapy  Sandi Mariscal 02/18/2019, 11:05 AM

## 2019-02-18 NOTE — Progress Notes (Signed)
Occupational Therapy Session Note  Patient Details  Name: Joel Rogers MRN: [id number] Date of Birth: 02-06-1951  Today's Date: 02/18/2019 OT Individual Time: 3818-4037 OT Individual Time Calculation (min): 56 min   Short Term Goals: Week 1:  OT Short Term Goal 1 (Week 1): Pt will complete sit > stand with max assist +2 to decrease burden of care for LB bathing/dressing OT Short Term Goal 2 (Week 1): Pt will complete UB dressing with mod assist OT Short Term Goal 3 (Week 1): Pt will complete toilet transfer with max assist +2 OT Short Term Goal 4 (Week 1): Pt will complete bathing with mod assist from bed level.    Skilled Therapeutic Interventions/Progress Updates:    Pt greeted in bed, reporting rib pain was "good" at rest. He wanted to put on pants and then eat his breakfast. Max A for supine<sit. Pt required supervision for sitting balance EOB and able to correct posterior lean with cuing. Rt hand weightbearing on bed while OT donned Teds. Once OT got him started, pt able to pull pants up from ankles to thighs. Able to push LEs into sneakers with cuing. He was adamant that he wanted to try sit<stand without device. He could not do so safely with 2 assist 3 musketeers from elevated bed. Therefore Stedy utilized, and he required 2 helpers Max A for standing in device. Vcs required for rocking technique and using active assist to bring Rt hand onto Stedy bar. He required Total A to elevate pants, and then transferred to w/c to eat breakfast. With mod-max vcs, pt used Rt hand at dominant level. Able to use fork without adaptations. Active assist from Lt required when drinking beverages 50% of the time. Active assist from OT needed when reaching for condiments past midline, or pouring milk into cup due to proximal weakness. At end of session pt was left in w/c with safety belt fastened, half lap tray, and all needs within reach.   Therapy Documentation Precautions:  Precautions Precautions:  Fall Restrictions Weight Bearing Restrictions: No Pain: Pt reports "soreness" in ribs and Rt arm. He did not want OT to ask RN for pain medication.    ADL: ADL Upper Body Bathing: Maximal assistance Where Assessed-Upper Body Bathing: Bed level Lower Body Bathing: Dependent Where Assessed-Lower Body Bathing: Bed level Upper Body Dressing: Maximal assistance Where Assessed-Upper Body Dressing: Bed level Lower Body Dressing: Dependent Where Assessed-Lower Body Dressing: Bed level      Therapy/Group: Individual Therapy  Darrow Barreiro A Yanni Quiroa 02/18/2019, 12:31 PM

## 2019-02-18 NOTE — Progress Notes (Signed)
[place] PHYSICAL MEDICINE & REHABILITATION PROGRESS NOTE   Subjective/Complaints:  No issues overnite, watching golf, no breathing issues, needs assist with urinal  ROS- denies SOB, bowel or bladder issues chest is sore at lower ribs   Objective:   No results found. No results for input(s): WBC, HGB, HCT, PLT in the last 72 hours. No results for input(s): NA, K, CL, CO2, GLUCOSE, BUN, CREATININE, CALCIUM in the last 72 hours.  Intake/Output Summary (Last 24 hours) at 02/18/2019 1057 Last data filed at 02/18/2019 0800 Gross per 24 hour  Intake 720 ml  Output 400 ml  Net 320 ml     Physical Exam: Vital Signs Blood pressure 119/74, pulse 79, temperature 97.7 F (36.5 C), temperature source Oral, resp. rate 16, weight 111.8 kg, SpO2 100 %.  General: No acute distress Mood and affect are appropriate Heart: Regular rate and rhythm no rubs murmurs or extra sounds Chest tenderness to palpation sterno xiphoid junction Lungs: Clear to auscultation, breathing unlabored, no rales or wheezes Abdomen: Positive bowel sounds, soft nontender to palpation, nondistended, no epigastric tenderness Extremities:2+ edema RUE, 2+ edema RLE Skin: No evidence of breakdown, no evidence of rash Neurologic: Cranial nerves II through XII intact, motor strength is 5/5 in left and 3- RIght  deltoid, bicep, tricep, grip, hip flexor,2- right and 3- left knee extensors, ankle dorsiflexor and plantar flexor- unchanged   Musculoskeletal: no extremity pain with muscle testing    Assessment/Plan: 1. Functional deficits secondary to cardioembolic bicerebral infarcts which require 3+ hours per day of interdisciplinary therapy in a comprehensive inpatient rehab setting.  Physiatrist is providing close team supervision and 24 hour management of active medical problems listed below.  Physiatrist and rehab team continue to assess barriers to discharge/monitor patient progress toward functional and medical  goals  Care Tool:  Bathing    Body parts bathed by patient: Chest, Abdomen, Left upper leg, Face   Body parts bathed by helper: Right arm, Left arm, Front perineal area, Buttocks, Right upper leg, Left lower leg, Right lower leg     Bathing assist Assist Level: Maximal Assistance - Patient 24 - 49%(bed level)     Upper Body Dressing/Undressing Upper body dressing Upper body dressing/undressing activity did not occur (including orthotics): Refused What is the patient wearing?: Hospital gown only    Upper body assist Assist Level: Maximal Assistance - Patient 25 - 49%    Lower Body Dressing/Undressing Lower body dressing      What is the patient wearing?: Pants     Lower body assist Assist for lower body dressing: Total Assistance - Patient < 25%     Toileting Toileting    Toileting assist Assist for toileting: 2 Helpers Assistive Device Comment: BSC(BSC)   Transfers Chair/bed transfer  Transfers assist  Chair/bed transfer activity did not occur: Safety/medical concerns  Chair/bed transfer assist level: Dependent - mechanical lift     Locomotion Ambulation   Ambulation assist   Ambulation activity did not occur: Safety/medical concerns          Walk 10 feet activity   Assist  Walk 10 feet activity did not occur: Safety/medical concerns        Walk 50 feet activity   Assist Walk 50 feet with 2 turns activity did not occur: Safety/medical concerns         Walk 150 feet activity   Assist Walk 150 feet activity did not occur: Safety/medical concerns         Walk 10  feet on uneven surface  activity   Assist Walk 10 feet on uneven surfaces activity did not occur: Safety/medical concerns         Wheelchair     Assist Will patient use wheelchair at discharge?: Yes Type of Wheelchair: Manual    Wheelchair assist level: Minimal Assistance - Patient > 75% Max wheelchair distance: 135    Wheelchair 50 feet with 2 turns  activity    Assist    Wheelchair 50 feet with 2 turns activity did not occur: Safety/medical concerns(limited by fatigue)   Assist Level: Minimal Assistance - Patient > 75%   Wheelchair 150 feet activity     Assist Wheelchair 150 feet activity did not occur: Safety/medical concerns          Medical Problem List and Plan: 1.Persistent right side weakness with decreased mobilitysecondary to cardioembolic bi-cerebral infarction CIR PT, OT, SLP, RUE edema improving 2. Antithrombotics: -DVT/anticoagulation:ELIQUIS -antiplatelet therapy:  3. Pain Management:Ultracet as needed 4. Mood:Provide emotional support -antipsychotic agents: none 5. Neuropsych: This patientiscapable of making decisions on hisown behalf. 6. Skin/Wound Care:Routine skin checks 7. Fluids/Electrolytes/Nutrition:Routine in and out's with follow-up chemistries 8. Upper GI bleed EGD 3. Follow-up gastrin neurology services. Continue PPI -serial H/H's while on eliquis 9. Acute blood loss anemia.Recent GI bleed expect tarry stools  Patient transfused throughout hospital course. Follow-up CBC, hgb has been 7.5-8 g Stable 3/25 10. Chronic atrial fibrillation with TAVR procedure September 2019- Rate controlled 3/27 11. Non-STEMI. Thought secondary to thrombus status post cardiac catheterization. Continue eliquis 12. Acute on chronic diastolic congestive heart failure. Lasix 40 mg daily -daily weights show  6kg drop in 2d , negative 1l fluid balance on 3/27 would account for 2 kg , ? Measurement technique Filed Weights   02/15/19 0555 02/16/19 0500 02/18/19 0500  Weight: 117.3 kg 117.3 kg 111.8 kg  13. Hypertension. Norvasc 5 mg daily, Lopressor 50 mg twice a day. Monitor with increased mobility Vitals:   02/17/19 2003 02/18/19 0451  BP: 106/82 119/74  Pulse: 73 79  Resp: 18 16  Temp: 98.9 F (37.2 C) 97.7 F (36.5 C)  SpO2: 100% 100%   Controlled 3/29 14. Hyperlipidemia. Lipitor  LOS: 6 days A FACE TO FACE EVALUATION WAS PERFORMED  Erick Colace 02/18/2019, 10:57 AM

## 2019-02-19 ENCOUNTER — Encounter (HOSPITAL_COMMUNITY): Payer: Self-pay | Admitting: Psychology

## 2019-02-19 ENCOUNTER — Inpatient Hospital Stay (HOSPITAL_COMMUNITY): Payer: Self-pay | Admitting: Occupational Therapy

## 2019-02-19 ENCOUNTER — Inpatient Hospital Stay (HOSPITAL_COMMUNITY): Payer: Self-pay | Admitting: Speech Pathology

## 2019-02-19 ENCOUNTER — Inpatient Hospital Stay (HOSPITAL_COMMUNITY): Payer: Self-pay

## 2019-02-19 DIAGNOSIS — E876 Hypokalemia: Secondary | ICD-10-CM

## 2019-02-19 DIAGNOSIS — K922 Gastrointestinal hemorrhage, unspecified: Secondary | ICD-10-CM

## 2019-02-19 LAB — BASIC METABOLIC PANEL
ANION GAP: 12 (ref 5–15)
BUN: 15 mg/dL (ref 8–23)
CO2: 24 mmol/L (ref 22–32)
CREATININE: 0.79 mg/dL (ref 0.61–1.24)
Calcium: 7.9 mg/dL — ABNORMAL LOW (ref 8.9–10.3)
Chloride: 102 mmol/L (ref 98–111)
GFR calc non Af Amer: >60 mL/min (ref >60)
GLUCOSE: 98 mg/dL (ref 70–99)
Potassium: 2.8 mmol/L — ABNORMAL LOW (ref 3.5–5.1)
Sodium: 138 mmol/L (ref 135–145)

## 2019-02-19 LAB — CBC
HCT: 21.8 % — ABNORMAL LOW (ref 39.0–52.0)
Hemoglobin: 7 g/dL — ABNORMAL LOW (ref 13.0–17.0)
MCH: 26.1 pg (ref 26.0–34.0)
MCHC: 32.1 g/dL (ref 30.0–36.0)
MCV: 81.3 fL (ref 80.0–100.0)
Platelets: 210 10*3/uL (ref 150–400)
RBC: 2.68 MIL/uL — ABNORMAL LOW (ref 4.22–5.81)
RDW: 18.6 % — ABNORMAL HIGH (ref 11.5–15.5)
WBC: 13.2 10*3/uL — ABNORMAL HIGH (ref 4.0–10.5)
nRBC: 0 % (ref 0.0–0.2)

## 2019-02-19 MED ORDER — POTASSIUM CHLORIDE CRYS ER 20 MEQ PO TBCR
40.0000 meq | EXTENDED_RELEASE_TABLET | Freq: Two times a day (BID) | ORAL | Status: DC
  Administered 2019-02-19 – 2019-03-10 (×39): 40 meq via ORAL
  Filled 2019-02-19 (×39): qty 2

## 2019-02-19 MED ORDER — POTASSIUM CHLORIDE 10 MEQ/100ML IV SOLN
10.0000 meq | INTRAVENOUS | Status: AC
  Administered 2019-02-19 (×3): 10 meq via INTRAVENOUS
  Filled 2019-02-19 (×3): qty 100

## 2019-02-19 NOTE — Progress Notes (Signed)
Occupational Therapy Session Note  Patient Details  Name: Joel Rogers MRN: [id number] Date of Birth: 1951-07-02  Today's Date: 02/19/2019 OT Individual Time: 1173-5670 OT Individual Time Calculation (min): 75 min    Short Term Goals: Week 1:  OT Short Term Goal 1 (Week 1): Pt will complete sit > stand with max assist +2 to decrease burden of care for LB bathing/dressing OT Short Term Goal 2 (Week 1): Pt will complete UB dressing with mod assist OT Short Term Goal 3 (Week 1): Pt will complete toilet transfer with max assist +2 OT Short Term Goal 4 (Week 1): Pt will complete bathing with mod assist from bed level.       Skilled Therapeutic Interventions/Progress Updates:    Pt greeted in bed, requesting to get OOB. Supine<sit completed with Mod-Max A of 1. While EOB, pt weightbearing R UE on bed while OT applied lotion to feet, donned Teds, and sneakers. Pt bilaterally integrating UEs while elevating pants from ankles to thighs. OT assisted with threading feet into pants and managing catheter. Sit<stand in [place] with Max A +2 assist from elevated bed. Total A for elevating pants over hips. While in semi perched position in [place], he completed handwashing and face washing with vcs for using Rt hand at dominant level. Active assist for reaching soap dispenser and faucet lever. He wanted to return to w/c due to back pain vs remain in [place] for more of tx. While seated, continued working on Rt NMR and bilateral integration while doffing/donning 4 pillowcases for changing bed linen. He required significantly increased time to meet tasks demands, and questioning cues for problem solving. When w/c was positioned at baseboard, tried to have pt apply sheets and blankets, however, due to proximal weakness bilaterally, he was unable to do so, even with R UE active assist. He was able to pinch and pull linen over baseboard. Throughout task, applied MHP to upper back with pt reporting pain relief. Due to low  backrest of w/c, OT fashioned makeshift harness so that hot pack would stay on back. RN made aware to check on pt and adjust MHP if necessary. He was left with half lap tray, all needs, and safety belt fastened. Tx focus placed on sit<stands, Rt NMR, and supported standing.       Therapy Documentation Precautions:  Precautions Precautions: Fall Restrictions Weight Bearing Restrictions: No Vital Signs: Therapy Vitals Pulse Rate: 67 BP: 108/66 Pain: In back. Addressed as stated above.  Pain Assessment Pain Scale: 0-10 Pain Score: 0-No pain ADL: ADL Upper Body Bathing: Maximal assistance Where Assessed-Upper Body Bathing: Bed level Lower Body Bathing: Dependent Where Assessed-Lower Body Bathing: Bed level Upper Body Dressing: Maximal assistance Where Assessed-Upper Body Dressing: Bed level Lower Body Dressing: Dependent Where Assessed-Lower Body Dressing: Bed level     Therapy/Group: Individual Therapy  Maleke Feria A Renay Crammer 02/19/2019, 11:59 AM

## 2019-02-19 NOTE — Progress Notes (Signed)
[place] PHYSICAL MEDICINE & REHABILITATION PROGRESS NOTE   Subjective/Complaints:  Up in bed. No new issues. Was a little frustrated over weekend given his ongoing weakness and the fact that he's away from home. Felt a little desperate for a moment, but feeling better this morning  ROS: Patient denies fever, rash, sore throat, blurred vision, nausea, vomiting, diarrhea, cough, shortness of breath or chest pain, joint or back pain, headache .    Objective:   No results found. Recent Labs    02/19/19 0615  WBC 13.2*  HGB 7.0*  HCT 21.8*  PLT 210   Recent Labs    02/19/19 0615  NA 138  K 2.8*  CL 102  CO2 24  GLUCOSE 98  BUN 15  CREATININE 0.79  CALCIUM 7.9*    Intake/Output Summary (Last 24 hours) at 02/19/2019 1041 Last data filed at 02/19/2019 0730 Gross per 24 hour  Intake 358 ml  Output 700 ml  Net -342 ml     Physical Exam: Vital Signs Blood pressure 108/66, pulse 67, temperature 97.8 F (36.6 C), temperature source Oral, resp. rate 16, weight 113.9 kg, SpO2 100 %.  Constitutional: No distress . Vital signs reviewed. HEENT: EOMI, oral membranes moist Neck: supple Cardiovascular:IRR IRR with murmur. No JVD    Respiratory: CTA Bilaterally without wheezes or rales. Normal effort    GI: BS +, non-tender, non-distended  Extremities:1-2+ edema RUE, 1-2+ edema RLE Skin: No evidence of breakdown, no evidence of rash Neurologic: Cranial nerves II through XII intact, motor strength is 5/5 in left and 3- RIght  deltoid, bicep, tricep, grip, hip flexor,2- right and 3- left knee extensors, ankle dorsiflexor and plantar flexor- stable   Musculoskeletal: no extremity pain with muscle testing    Assessment/Plan: 1. Functional deficits secondary to cardioembolic bicerebral infarcts which require 3+ hours per day of interdisciplinary therapy in a comprehensive inpatient rehab setting.  Physiatrist is providing close team supervision and 24 hour management of active  medical problems listed below.  Physiatrist and rehab team continue to assess barriers to discharge/monitor patient progress toward functional and medical goals  Care Tool:  Bathing    Body parts bathed by patient: Chest, Abdomen, Left upper leg, Face   Body parts bathed by helper: Right arm, Left arm, Front perineal area, Buttocks, Right upper leg, Left lower leg, Right lower leg     Bathing assist Assist Level: Maximal Assistance - Patient 24 - 49%(bed level)     Upper Body Dressing/Undressing Upper body dressing Upper body dressing/undressing activity did not occur (including orthotics): Refused What is the patient wearing?: Hospital gown only    Upper body assist Assist Level: Maximal Assistance - Patient 25 - 49%    Lower Body Dressing/Undressing Lower body dressing      What is the patient wearing?: Pants     Lower body assist Assist for lower body dressing: Total Assistance - Patient < 25%     Editor, commissioning assist Assist for toileting: 2 Helpers Assistive Device Comment: BSC(BSC)   Transfers Chair/bed transfer  Transfers assist  Chair/bed transfer activity did not occur: Safety/medical concerns  Chair/bed transfer assist level: Dependent - mechanical lift     Locomotion Ambulation   Ambulation assist   Ambulation activity did not occur: Safety/medical concerns          Walk 10 feet activity   Assist  Walk 10 feet activity did not occur: Safety/medical concerns  Walk 50 feet activity   Assist Walk 50 feet with 2 turns activity did not occur: Safety/medical concerns         Walk 150 feet activity   Assist Walk 150 feet activity did not occur: Safety/medical concerns         Walk 10 feet on uneven surface  activity   Assist Walk 10 feet on uneven surfaces activity did not occur: Safety/medical concerns         Wheelchair     Assist Will patient use wheelchair at discharge?: Yes Type of  Wheelchair: Manual    Wheelchair assist level: Moderate Assistance - Patient 50 - 74% Max wheelchair distance: 100 ft     Wheelchair 50 feet with 2 turns activity    Assist    Wheelchair 50 feet with 2 turns activity did not occur: Safety/medical concerns(limited by fatigue)   Assist Level: Moderate Assistance - Patient 50 - 74%   Wheelchair 150 feet activity     Assist Wheelchair 150 feet activity did not occur: Safety/medical concerns          Medical Problem List and Plan: 1.Persistent right side weakness with decreased mobilitysecondary to cardioembolic bi-cerebral infarction CIR PT, OT, SLP, RUE edema improving, stable 2. Antithrombotics: -DVT/anticoagulation:ELIQUIS -antiplatelet therapy:  3. Pain Management:Ultracet as needed 4. Mood:Provide emotional support  -will ask neuropsych to see re: coping with episodic stress/anxiety -antipsychotic agents: none 5. Neuropsych: This patientiscapable of making decisions on hisown behalf. 6. Skin/Wound Care:Routine skin checks 7. Fluids/Electrolytes/Nutrition:encourage PO  -3 runs vitamin K today  -begin kdur bid  -recheck CBC tomorrow 8. Upper GI bleed EGD 3. Follow-up gastrin neurology services. Continue PPI -serial H/H's  -hgb 7.0 today, recheck tomorrow 9. Acute blood loss anemia.Recent GI bleed expect tarry stools  Patient transfused throughout hospital course. Follow-up CBC, hgb has been 7.5-8 g Stable 3/25 10. Chronic atrial fibrillation with TAVR procedure September 2019- Rate controlled 3/27 11. Non-STEMI. Thought secondary to thrombus status post cardiac catheterization. Continue eliquis 12. Acute on chronic diastolic congestive heart failure. Lasix 40 mg daily -weights down  -question accuracy.  -continue to follow closely, lungs clear Filed Weights   02/16/19 0500 02/18/19 0500 02/19/19 0535  Weight: 117.3 kg 111.8 kg 113.9  kg  13. Hypertension. Norvasc 5 mg daily, Lopressor 50 mg twice a day. Monitor with increased mobility Vitals:   02/19/19 0535 02/19/19 0836  BP: 103/65 108/66  Pulse: 65 67  Resp: 16   Temp: 97.8 F (36.6 C)   SpO2: 100%   Controlled 3/30 14. Hyperlipidemia. Lipitor  LOS: 7 days A FACE TO FACE EVALUATION WAS PERFORMED  Ranelle Oyster 02/19/2019, 10:41 AM

## 2019-02-19 NOTE — Progress Notes (Signed)
Occupational Therapy Session Note  Patient Details  Name: Joel Rogers MRN: [id number] Date of Birth: [birth year]  Today's Date: 02/19/2019 OT Individual Time: 8250-5397 OT Individual Time Calculation (min): 42 min    Short Term Goals: Week 1:  OT Short Term Goal 1 (Week 1): Pt will complete sit > stand with max assist +2 to decrease burden of care for LB bathing/dressing OT Short Term Goal 2 (Week 1): Pt will complete UB dressing with mod assist OT Short Term Goal 3 (Week 1): Pt will complete toilet transfer with max assist +2 OT Short Term Goal 4 (Week 1): Pt will complete bathing with mod assist from bed level.    Skilled Therapeutic Interventions/Progress Updates:    Upon entering the room, pt seated in wheelchair finishing meal with focus on using R UE for task. Pt verbalized pain in ribs of 8/10 with movement. Pt reluctant to do therapeutic intervention this session secondary to pain. OT discussed transfer method with use of slideboard transfer and demonstrated use and technique. Pt verbalized, "Yeah, you are right. I need to do that but not right not." Pt declined to practice transfer this session. Pt remained in wheelchair with call bell and all needed items within reach.   Therapy Documentation Precautions:  Precautions Precautions: Fall Restrictions Weight Bearing Restrictions: No Vital Signs: Therapy Vitals Temp: 98.6 F (37 C) Temp Source: Oral Pulse Rate: 82 Resp: 18 BP: 110/69 Patient Position (if appropriate): Sitting Oxygen Therapy SpO2: 100 % O2 Device: Room Air ADL: ADL Upper Body Bathing: Maximal assistance Where Assessed-Upper Body Bathing: Bed level Lower Body Bathing: Dependent Where Assessed-Lower Body Bathing: Bed level Upper Body Dressing: Maximal assistance Where Assessed-Upper Body Dressing: Bed level Lower Body Dressing: Dependent Where Assessed-Lower Body Dressing: Bed level   Therapy/Group: Individual Therapy  [name] 02/19/2019, 4:47 PM

## 2019-02-19 NOTE — Progress Notes (Signed)
Speech Language Pathology Daily Session Note  Patient Details  Name: SERGEI BORTNER MRN: [id number] Date of Birth: [birth year]  Today's Date: 02/19/2019 SLP Individual Time: 0730-0830 SLP Individual Time Calculation (min): 60 min  Short Term Goals: Week 1: SLP Short Term Goal 1 (Week 1): Pt will utilize compensatory and external memory aids to recall, new daily information with Min A verbal cues. SLP Short Term Goal 2 (Week 1): Pt will complete semi-complex tasks with Mod A verbal cues for functional problem solving.  SLP Short Term Goal 3 (Week 1): Pt will demonstrate intellectual awareness by listing 3 physical and 3 cognitive deficits with Min A cues.  SLP Short Term Goal 4 (Week 1): Pt will demonstrate selective attention for ~ 30 minutes in mildly distracting environment with Min A cues.  SLP Short Term Goal 5 (Week 1): Pt will follow 2 step directions with 80% accuracy and Min A cues.   Skilled Therapeutic Interventions:  Skilled treatment session focused on cognition goals. SLP facilitated session by providing Min A cues to recall rules/steps of BLINK (previously taught 2 days prior to this session). After recalling steps, pt required Max A multimodal cues to implement problem solving steps. Pt was able to verbalize steps/problem solving/rules but was not able to implement the steps. Pt able to demonstrate selective attention in mildly distracting environment and required Min A cues to complete 2 step directions (in 7 out of 10) d/t deficits in working memory. Pt left upright in bed, bed alarm on and all needs within reach. Continue per current plan of care.      Pain Pain Assessment Pain Scale: 0-10 Pain Score: 0-No pain  Therapy/Group: Individual Therapy  Smantha Boakye 02/19/2019, 8:47 AM

## 2019-02-19 NOTE — Consult Note (Signed)
Neuropsychological Consultation   Patient:   Joel Rogers   DOB:   23-Nov-1950  MR Number:  [id number]  Location:  [facility] [address] CENTER A [id number] [name] [address] Dept: [phone] Loc: [phone]           Date of Service:   02/19/2019  Start Time:   9 AM End Time:   10 AM  Provider/Observer:  Arley Phenix, Psy.D.       Clinical Neuropsychologist       Billing Code/Service: 44010 4 Units  Chief Complaint:    Joel Rogers is a 68 year old male with history of diastolic congestive heart failure, aortic stenosis with TAVR procedure Sept 2019, Afib, OSA with CPAP, hypertension.  Presented to [facility] on 01/13/19 with upper extremity pain and recent fall with right sided weakness findings of sepsis due to staphylococcus bacteremia and cellulitis.  MRI revealed multiple acute ischemic infarcts consistent with embolic stroke.  Transferred to [facility].  Once acute medical issues addressed patient referred to CIR program for further care and rehab post stroke.    Reason for Service:  The patient was referred for neuropsychological consultation due to coping and adjustment issues.  Below is the HPI for the current admission.  Joel Rogers is a 68 year old right-handed male with history of diastolic congestive heart failure, aortic stenosis with TAVR procedure September 2019,atrial fibrillation maintained on Eliquis, OSA with CPAP, hypertension. Per chart review patient lives with spouse community ambulator and active prior to admission. Initially presented to [facility] 01/13/2019 secondary to right upper extremity pain and recent fall with right sided weakness findings of sepsis due to Staphylococcus bacteremia and cellulitis during admission maintain on antibiotic therapy. Noted persistent right side weakness. MRI the brain revealed multiple acute ischemic infarcts in a pattern most consistent  with cardioembolic stroke. He was transferred to [facility] for further evaluation. Venous Doppler studies negative for DVTs. Follow-up MRI the brain with numerous scattered foci of acute ischemia throughout the cerebrum and cerebellum. No midline shift or mass effect. No proximal intracranial arterial occlusion. Echocardiogram with ejection fraction of 60% and normal systolic function. TEE showed no vegetation on bioprosthetic aortic valve. Patient remained on eliquis for atrial fibrillation with the addition of aspirin for CVA prophylaxis. He developed coffee-ground emesis with acute blood loss anemia requiring transfusion totaling 6 units pack red blood cells with gastroenterology consulted for EGD completed 2 with findings of red blood in the lower third of the esophagus clotted blood in the entire stomach. Blood thinners were held and follow-up GI endoscopy again completed 01/26/2019 showing no active bleeding or blood present. Recommendations were to hold aspirin and eliquis for approximately 3-5 days and then resume. He was placed on Protonix twice daily.hospital course complicated by AKI with creatinine baseline 1.1-1.23 elevated to 1.95 renal services consulted likely secondary to ischemic ATN in the setting of sepsis maintained on gentle IV fluids creatinine improved to 1.00. Therapy evaluations completed patient was admitted for a comprehensive rehabilitation program 01/30/2019. On 02/03/2019 patient with burning chest pain felt like acid reflux he did receive a dose of Maalox. Denied shortness of breath. Patient became unresponsive and code was called and noted to be in ventricular fibrillation. Troponin was noted to be elevated. He was discharged to acute care services 02/03/2019 maintain on intravenous heparin and followed by cardiology services. Echocardiogram was repeated with ejection fraction greater than 65% hyperdynamic systolic function.cardiac  catheterization same day showed Lat  Ramus lesion 99% stenosed. Heavy thrombus. No other significant CAD. His Eliquis was resumed and monitor for any bleeding episodes. Nasogastric tube initially in place and diet has been advanced to regular consistency. Therapies have been resumed patient tolerating nicely and was readmitted back to inpatient rehabilitation services.  Current Status:  The patient reports that he has had difficulty with extended hospitalization being away from family and friends.  He reports that it has increased anxiety and distressed.    Behavioral Observation: Joel Rogers  presents as a 68 y.o.-year-old Right African American Male who appeared his stated age. his dress was Appropriate and he was Well Groomed and his manners were Appropriate to the situation.  his participation was indicative of Appropriate and Attentive behaviors.  There were any physical disabilities noted.  he displayed an appropriate level of cooperation and motivation.     Interactions:    Active Appropriate and Attentive  Attention:   within normal limits and attention span and concentration were age appropriate  Memory:   within normal limits; recent and remote memory intact  Visuo-spatial:  not examined  Speech (Volume):  normal  Speech:   normal; normal  Thought Process:  Coherent and Relevant  Though Content:  WNL; not suicidal and not homicidal  Orientation:   person, place, time/date and situation  Judgment:   Good  Planning:   Good  Affect:    Anxious  Mood:    Anxious  Insight:   Good  Intelligence:   high  Medical History:   Past Medical History:  Diagnosis Date  . Anemia    low iron  . Arthritis    bilateral knees  . Atrial fibrillation, chronic   . Chronic diastolic CHF (congestive heart failure) (HCC) 06/15/2018  . History of subdural hematoma   . Hyperlipidemia   . Hypertension   . Morbid obesity (HCC)   . Myocardial infarction (HCC)   . Normal coronary arteries    by cardiac catheterization  performed 03/14/06  . Pre-diabetes    pt denies  . S/P TAVR (transcatheter aortic valve replacement)    a. 07/25/18: Edwards Sapien 3 THV (size 26 mm, model # 9600TFX, serial # [id number]) by Dr. Laneta Simmers and Dr. Clifton James  . Sleep apnea    on CPAP  . Stroke (HCC)   . Venous insufficiency   . VF (ventricular fibrillation) (HCC) causing cardiac arrest 02/03/2019   Psychiatric History:  No prior psych history  Family Med/Psych History:  Family History  Problem Relation Age of Onset  . Hypertension Father   . Cancer Father        prob prostate  . Alzheimer's disease Mother   . Alzheimer's disease Maternal Grandfather   . Breast cancer Sister   . Cancer Brother        "brain tumor"    Risk of Suicide/Violence: virtually non-existent   Impression/DX:  Joel Rogers is a 68 year old male with history of diastolic congestive heart failure, aortic stenosis with TAVR procedure Sept 2019, Afib, OSA with CPAP, hypertension.  Presented to [facility] on 01/13/19 with upper extremity pain and recent fall with right sided weakness findings of sepsis due to staphylococcus bacteremia and cellulitis.  MRI revealed multiple acute ischemic infarcts consistent with embolic stroke.  Transferred to [facility].  Once acute medical issues addressed patient referred to CIR program for further care and rehab post stroke.   The patient reports that he has had difficulty with  extended hospitalization being away from family and friends.  He reports that it has increased anxiety and distressed.    Disposition/Plan:  Will see again first next week.    Diagnosis:    Posterior circulation stroke [facility]) - Plan: Ambulatory referral to Neurology         Electronically Signed   _______________________ Arley Phenix, Psy.D.

## 2019-02-19 NOTE — Progress Notes (Signed)
Physical Therapy Session Note  Patient Details  Name: Joel Rogers MRN: [id number] Date of Birth: 06/13/1951  Today's Date: 02/19/2019 PT Individual Time: 1545-1700 PT Individual Time Calculation (min): 75 min   Short Term Goals: Week 1:  PT Short Term Goal 1 (Week 1): Pt will transfer bed<>w/c with max assist +1 using LRAD. PT Short Term Goal 2 (Week 1): Pt will complete bed mobility with mod assist +1. PT Short Term Goal 3 (Week 1): Pt will propel w/c 25 ft with min assist.   Skilled Therapeutic Interventions/Progress Updates:    Pt seated in w/c upon PT arrival, agreeable to therapy tx and denies pain. Pt reports being incontinent, "It's terrible, I got to be cleaned." Therapist educated pt on the importance of continence, risks of skin break down, and calling to use bathroom ahead of time. Pt performed sit<>stand from w/c within stedy from w/c with max assist +2. Pt performed x 3 sit<>stands from elevated stedy seat this session with mos assist +2. Pt able to maintain standing balance this session for bouts of 2-3 minutes with UE support on stedy while therapist and second helper performed clothing management total assist, performed peri-care total assist and donned clean brief total assist. Pt's catheter not in place, therapist alerted RN. Pt sitting EOB donned clean shorts total assist to loop LEs through and pull over hips in standing. Pt's w/c cushion soiled, therapist cleaned and replaced cushion this session. Pt performed sit<>stand from elevated bed within stedy with Max assist +2 and transferred to w/c. Pt transported to the gym. Pt seated in w/c performed LE therex for strengthening including 2 x 10 each: LAQ and hip flexion. Pt performed 2 x 10 sit ups in w/c this session for core strengthening. Pt performed R UE strengthening for neuro re-ed inlcuding 2 x 10 bicep curls and active assisted shoulder flexion. Pt transported back to room at end of session and left seated in w/c with  needs in reach and chair alarm set.   Therapy Documentation Precautions:  Precautions Precautions: Fall Restrictions Weight Bearing Restrictions: No  Therapy/Group: Individual Therapy  Cresenciano Genre, PT, DPT 02/19/2019, 8:02 AM

## 2019-02-20 ENCOUNTER — Inpatient Hospital Stay (HOSPITAL_COMMUNITY): Payer: Self-pay

## 2019-02-20 ENCOUNTER — Telehealth: Payer: Self-pay | Admitting: Cardiovascular Disease

## 2019-02-20 ENCOUNTER — Inpatient Hospital Stay (HOSPITAL_COMMUNITY): Payer: Self-pay | Admitting: Occupational Therapy

## 2019-02-20 LAB — CBC
HCT: 21.2 % — ABNORMAL LOW (ref 39.0–52.0)
Hemoglobin: 7.1 g/dL — ABNORMAL LOW (ref 13.0–17.0)
MCH: 27.2 pg (ref 26.0–34.0)
MCHC: 33.5 g/dL (ref 30.0–36.0)
MCV: 81.2 fL (ref 80.0–100.0)
Platelets: 212 10*3/uL (ref 150–400)
RBC: 2.61 MIL/uL — AB (ref 4.22–5.81)
RDW: 18.6 % — ABNORMAL HIGH (ref 11.5–15.5)
WBC: 14.6 10*3/uL — ABNORMAL HIGH (ref 4.0–10.5)
nRBC: 0 % (ref 0.0–0.2)

## 2019-02-20 LAB — BASIC METABOLIC PANEL
ANION GAP: 8 (ref 5–15)
BUN: 14 mg/dL (ref 8–23)
CO2: 24 mmol/L (ref 22–32)
Calcium: 7.9 mg/dL — ABNORMAL LOW (ref 8.9–10.3)
Chloride: 105 mmol/L (ref 98–111)
Creatinine, Ser: 0.82 mg/dL (ref 0.61–1.24)
GFR calc Af Amer: >60 mL/min (ref >60)
GFR calc non Af Amer: >60 mL/min (ref >60)
Glucose, Bld: 101 mg/dL — ABNORMAL HIGH (ref 70–99)
Potassium: 3.3 mmol/L — ABNORMAL LOW (ref 3.5–5.1)
Sodium: 137 mmol/L (ref 135–145)

## 2019-02-20 NOTE — Progress Notes (Signed)
Speech Language Pathology Daily Session Note  Patient Details  Name: Joel Rogers MRN: [id number] Date of Birth: 29-Sep-1951  Today's Date: 02/20/2019 SLP Individual Time: 1000-1100 SLP Individual Time Calculation (min): 60 min  Short Term Goals: Week 1: SLP Short Term Goal 1 (Week 1): Pt will utilize compensatory and external memory aids to recall, new daily information with Min A verbal cues. SLP Short Term Goal 2 (Week 1): Pt will complete semi-complex tasks with Mod A verbal cues for functional problem solving.  SLP Short Term Goal 3 (Week 1): Pt will demonstrate intellectual awareness by listing 3 physical and 3 cognitive deficits with Min A cues.  SLP Short Term Goal 4 (Week 1): Pt will demonstrate selective attention for ~ 30 minutes in mildly distracting environment with Min A cues.  SLP Short Term Goal 5 (Week 1): Pt will follow 2 step directions with 80% accuracy and Min A cues.   Skilled Therapeutic Interventions: Skilled ST services focused on cognitive skills. Pt required verbal cues to refer to visual aid posted in room for date and month, otherwise orientated to place, situation and time.SLP facilitated problem solving and recall skills, utilizing basic money management tasks, pt required supervision A verbal cues for error awareness in counting change and mod A verbal cues, with addition of written aid dictated by pt to aid in working memory, for making change. SLP facilitated recall and verbal problem solving skills in medication management task , pt required supervision verbal cues for recall with use of external aid and min A cues for verbal problem solving. Pt required extensive education pertaining to medication management and not adjust medication to "old medication" once returning home, pt stated understanding. Pt was left in room with call bell within reach and chair alarm set. ST recommends to continue skilled ST services.      Pain Pain Assessment Pain Scale:  0-10 Pain Score: 0-No pain  Therapy/Group: Individual Therapy  Neil Errickson  [facility] 02/20/2019, 12:31 PM

## 2019-02-20 NOTE — Progress Notes (Signed)
[place] PHYSICAL MEDICINE & REHABILITATION PROGRESS NOTE   Subjective/Complaints:  Had a restless night. Felt that he struggled a little bit emotionally yesterday. Appreciated neuropsych visit.   ROS: Patient denies fever, rash, sore throat, blurred vision, nausea, vomiting, diarrhea, cough, shortness of breath or chest pain, joint or back pain, headache, or mood change.   Objective:   No results found. Recent Labs    02/19/19 0615 02/20/19 0654  WBC 13.2* 14.6*  HGB 7.0* 7.1*  HCT 21.8* 21.2*  PLT 210 212   Recent Labs    02/19/19 0615 02/20/19 0654  NA 138 137  K 2.8* 3.3*  CL 102 105  CO2 24 24  GLUCOSE 98 101*  BUN 15 14  CREATININE 0.79 0.82  CALCIUM 7.9* 7.9*    Intake/Output Summary (Last 24 hours) at 02/20/2019 1055 Last data filed at 02/20/2019 0814 Gross per 24 hour  Intake 698 ml  Output 650 ml  Net 48 ml     Physical Exam: Vital Signs Blood pressure 108/70, pulse 78, temperature 98.7 F (37.1 C), temperature source Oral, resp. rate 17, weight 112.1 kg, SpO2 100 %.  Constitutional: No distress . Vital signs reviewed. HEENT: EOMI, oral membranes moist Neck: supple Cardiovascular: RRR without murmur. No JVD    Respiratory: CTA Bilaterally without wheezes or rales. Normal effort    GI: BS +, non-tender, non-distended  Extremities:1-2+ edema RUE, 1+ edema RLE Skin: No evidence of breakdown, no evidence of rash Neurologic: Cranial nerves II through XII intact, motor strength is 5/5 in left and 3- RIght  deltoid, bicep, tricep, grip, hip flexor,2- right and 3- left knee extensors, ankle dorsiflexor and plantar flexor- no substantial motor/sensory changes today   Musculoskeletal: no extremity pain with PROM, chest wall still sore    Assessment/Plan: 1. Functional deficits secondary to cardioembolic bicerebral infarcts which require 3+ hours per day of interdisciplinary therapy in a comprehensive inpatient rehab setting.  Physiatrist is providing  close team supervision and 24 hour management of active medical problems listed below.  Physiatrist and rehab team continue to assess barriers to discharge/monitor patient progress toward functional and medical goals  Care Tool:  Bathing    Body parts bathed by patient: Chest, Abdomen, Left upper leg, Face, Right upper leg   Body parts bathed by helper: Front perineal area, Buttocks     Bathing assist Assist Level: 2 Helpers(bed level for perineal hygiene)     Upper Body Dressing/Undressing Upper body dressing Upper body dressing/undressing activity did not occur (including orthotics): Refused What is the patient wearing?: Hospital gown only    Upper body assist Assist Level: Maximal Assistance - Patient 25 - 49%    Lower Body Dressing/Undressing Lower body dressing      What is the patient wearing?: Pants     Lower body assist Assist for lower body dressing: 2 Helpers(sit > stand in [place])     Financial trader Activity did not occur Press photographer and hygiene only): Refused  Toileting assist Assist for toileting: 2 Helpers Assistive Device Comment: BSC(BSC)   Transfers Chair/bed transfer  Transfers assist  Chair/bed transfer activity did not occur: Safety/medical concerns  Chair/bed transfer assist level: 2 Helpers(Stedy)     Locomotion Ambulation   Ambulation assist   Ambulation activity did not occur: Safety/medical concerns          Walk 10 feet activity   Assist  Walk 10 feet activity did not occur: Safety/medical concerns  Walk 50 feet activity   Assist Walk 50 feet with 2 turns activity did not occur: Safety/medical concerns         Walk 150 feet activity   Assist Walk 150 feet activity did not occur: Safety/medical concerns         Walk 10 feet on uneven surface  activity   Assist Walk 10 feet on uneven surfaces activity did not occur: Safety/medical concerns          Wheelchair     Assist Will patient use wheelchair at discharge?: Yes Type of Wheelchair: Manual    Wheelchair assist level: Moderate Assistance - Patient 50 - 74% Max wheelchair distance: 100 ft     Wheelchair 50 feet with 2 turns activity    Assist    Wheelchair 50 feet with 2 turns activity did not occur: Safety/medical concerns(limited by fatigue)   Assist Level: Moderate Assistance - Patient 50 - 74%   Wheelchair 150 feet activity     Assist Wheelchair 150 feet activity did not occur: Safety/medical concerns          Medical Problem List and Plan: 1.Persistent right side weakness with decreased mobilitysecondary to cardioembolic bi-cerebral infarction -Continue CIR therapies including PT, OT, and SLP  2. Antithrombotics: -DVT/anticoagulation:ELIQUIS -antiplatelet therapy:  3. Pain Management:Ultracet as needed 4. Mood:Provide emotional support  -appreciate neuropsych input re: coping with episodic stress/anxiety -antipsychotic agents: none 5. Neuropsych: This patientiscapable of making decisions on hisown behalf. 6. Skin/Wound Care:Routine skin checks 7. Fluids/Electrolytes/Nutrition:encourage PO  -3 runs vitamin K 3/30  -continue kdur bid  -repeat bMEt tomorrow 8. Upper GI bleed EGD 3. Follow-up gastrin neurology services. Continue PPI -serial H/H's  -hgb 7.0 3/30, 7.1 3/31---no active signs of blood loss at present 9. Acute blood loss anemia.Recent GI bleed expect tarry stools  Patient transfused throughout hospital course. Follow-up CBC, hgb has been 7.5-8 g Stable 3/25 10. Chronic atrial fibrillation with TAVR procedure September 2019- Rate controlled 3/27 11. Non-STEMI. Thought secondary to thrombus status post cardiac catheterization. Continue eliquis 12. Acute on chronic diastolic congestive heart failure. Lasix 40 mg daily -weights inconsistent but appear  stable.  -continue to follow closely, lungs clear Filed Weights   02/18/19 0500 02/19/19 0535 02/20/19 0545  Weight: 111.8 kg 113.9 kg 112.1 kg  13. Hypertension. Norvasc 5 mg daily, Lopressor 50 mg twice a day. Monitor with increased mobility Vitals:   02/20/19 0542 02/20/19 0821  BP: 106/73 108/70  Pulse: 74 78  Resp:    Temp:    SpO2:    Controlled 3/31 14. Hyperlipidemia. Lipitor 15. Persistent leukocytosis:  -lungs, urine clear  -afebrile  -may be reactive  -continue to monitor  LOS: 8 days A FACE TO FACE EVALUATION WAS PERFORMED  Ranelle Oyster 02/20/2019, 10:55 AM

## 2019-02-20 NOTE — Progress Notes (Signed)
Physical Therapy Session Note  Patient Details  Name: Joel Rogers MRN: [id number] Date of Birth: [birth year]  Today's Date: 02/20/2019 PT Individual Time: 1335-1435 PT Individual Time Calculation (min): 60 min   Short Term Goals: Week 1:  PT Short Term Goal 1 (Week 1): Pt will transfer bed<>w/c with max assist +1 using LRAD. PT Short Term Goal 2 (Week 1): Pt will complete bed mobility with mod assist +1. PT Short Term Goal 3 (Week 1): Pt will propel w/c 25 ft with min assist.   Skilled Therapeutic Interventions/Progress Updates: Pt presented in w/c agreeable to therapy. Pt stating felt spent too long in w/c and fatigued. Session focused on scooting and SB transfer. Pt transported to rehab gym for energy conservation. PTA set up SB and instructed in use of SB transfer. Pt performed SB transfer with maxA x 2 with moderate cues for increasing forward flexion and improving head hips relationship. Once pt at mat, pt attempting lateral shifts with instruction to increase forward flexion and incorporate use of BLE. Pt verbalizing frustration in inability and difficulty to perform activity. Provided emotional support to pt. Pt performed SB transfer to L maxA x 2 with cues to increase wt throughout BLE and tactile cues for hand placement. Participated in w/c mobility x 26ft with modA and maxA for turning L requiring HOH assist for sequencing. Pt transported remaining distance back to room. Pt requesting to trial recliner instead of w/c. Pt attempted STS with Stedy maxA x 2 however too fatigued to clear hips. Pt left with nsg present to perform Maxi Move transfer to recliner.      Therapy Documentation Precautions:  Precautions Precautions: Fall Restrictions Weight Bearing Restrictions: No General:   Vital Signs: Therapy Vitals Temp: 98.9 F (37.2 C) Temp Source: Oral Pulse Rate: 77 Resp: 18 BP: 105/70 Patient Position (if appropriate): Sitting Oxygen Therapy SpO2: 100 % Pain: Pain  Assessment Pain Score: 0-No pain    Therapy/Group: Individual Therapy  Taraneh Metheney  Shanyn Preisler, PTA  02/20/2019, 3:58 PM

## 2019-02-20 NOTE — Telephone Encounter (Signed)
Ok to reschedule echo fro 3 months due to [facility] virus concerns Patient is currently an inpatient at [facility] room [phone]

## 2019-02-20 NOTE — Progress Notes (Signed)
Occupational Therapy Session Note  Patient Details  Name: Joel Rogers MRN: [id number] Date of Birth: 1951-02-25  Today's Date: 02/20/2019 OT Individual Time: 5400-8676 OT Individual Time Calculation (min): 70 min    Short Term Goals: Week 1:  OT Short Term Goal 1 (Week 1): Pt will complete sit > stand with max assist +2 to decrease burden of care for LB bathing/dressing OT Short Term Goal 2 (Week 1): Pt will complete UB dressing with mod assist OT Short Term Goal 3 (Week 1): Pt will complete toilet transfer with max assist +2 OT Short Term Goal 4 (Week 1): Pt will complete bathing with mod assist from bed level.    Skilled Therapeutic Interventions/Progress Updates:    Treatment session with focus on ADL retraining with bed mobility, sitting balance, sit > stand, and transfers.  Pt received supine in bed reporting upset stomach from drinking milk yesterday.  Pt agreeable to bathing/dressing at sit > stand level from EOB.  Once seated upright, pt reports need to have BM.  Therapist attempted to locate bariatric/wide [facility] to allow for increased ease of transfer to [facility].  Pt reports urgency therefore completed BM in incontinence brief as he does not fit on bedpan.  Engaged in rolling at bed level for hygiene with +2 to maintain BLE in sidelying while other person assisted with hygiene.  Completed LB dressing at sit > stand level in [place] with pt able to pull up on Stedy bar with +2 for lifiting assistance.  Max cues for weight shifting and body positioning to increase upright posture.  Engaged in UB bathing seated upright in w/c with setup for items.  Therapist donned TEDS and shoes while pt upright in w/c.  Pt left upright in w/c with seat belt alarm on and all needs in reach.  Obtained a wide drop arm BSC for future transfers to focus on progression with slide board transfers as well as use of Stedy to increase frequency of continent BM.  Therapy Documentation Precautions:   Precautions Precautions: Fall Restrictions Weight Bearing Restrictions: No General:   Vital Signs: Therapy Vitals Pulse Rate: 78 BP: 108/70 Pain: Pain Assessment Pain Scale: 0-10 Pain Score: 2    Therapy/Group: Individual Therapy  Tashi Andujo 02/20/2019, 10:14 AM

## 2019-02-21 ENCOUNTER — Encounter (HOSPITAL_COMMUNITY): Payer: Self-pay | Admitting: Radiology

## 2019-02-21 ENCOUNTER — Inpatient Hospital Stay (HOSPITAL_COMMUNITY): Payer: Self-pay | Admitting: Occupational Therapy

## 2019-02-21 ENCOUNTER — Inpatient Hospital Stay (HOSPITAL_COMMUNITY): Payer: Self-pay | Admitting: Speech Pathology

## 2019-02-21 ENCOUNTER — Inpatient Hospital Stay (HOSPITAL_COMMUNITY): Payer: Self-pay | Admitting: Physical Therapy

## 2019-02-21 LAB — BASIC METABOLIC PANEL
ANION GAP: 6 (ref 5–15)
BUN: 13 mg/dL (ref 8–23)
CALCIUM: 8.1 mg/dL — AB (ref 8.9–10.3)
CO2: 25 mmol/L (ref 22–32)
Chloride: 104 mmol/L (ref 98–111)
Creatinine, Ser: 0.85 mg/dL (ref 0.61–1.24)
GFR calc Af Amer: >60 mL/min (ref >60)
Glucose, Bld: 98 mg/dL (ref 70–99)
POTASSIUM: 3.8 mmol/L (ref 3.5–5.1)
Sodium: 135 mmol/L (ref 135–145)

## 2019-02-21 LAB — CBC
HCT: 22.6 % — ABNORMAL LOW (ref 39.0–52.0)
HEMOGLOBIN: 7.2 g/dL — AB (ref 13.0–17.0)
MCH: 26 pg (ref 26.0–34.0)
MCHC: 31.9 g/dL (ref 30.0–36.0)
MCV: 81.6 fL (ref 80.0–100.0)
Platelets: 217 10*3/uL (ref 150–400)
RBC: 2.77 MIL/uL — AB (ref 4.22–5.81)
RDW: 18.8 % — ABNORMAL HIGH (ref 11.5–15.5)
WBC: 14.3 10*3/uL — ABNORMAL HIGH (ref 4.0–10.5)
nRBC: 0 % (ref 0.0–0.2)

## 2019-02-21 LAB — PREPARE RBC (CROSSMATCH)

## 2019-02-21 MED ORDER — FUROSEMIDE 10 MG/ML IJ SOLN
INTRAMUSCULAR | Status: AC
  Filled 2019-02-21: qty 2

## 2019-02-21 MED ORDER — DIPHENHYDRAMINE HCL 25 MG PO CAPS
25.0000 mg | ORAL_CAPSULE | Freq: Once | ORAL | Status: AC
  Administered 2019-02-21: 13:00:00 25 mg via ORAL
  Filled 2019-02-21: qty 1

## 2019-02-21 MED ORDER — ACETAMINOPHEN 325 MG PO TABS
650.0000 mg | ORAL_TABLET | Freq: Once | ORAL | Status: AC
  Administered 2019-02-21: 650 mg via ORAL
  Filled 2019-02-21: qty 2

## 2019-02-21 MED ORDER — FUROSEMIDE 10 MG/ML IJ SOLN
20.0000 mg | Freq: Once | INTRAMUSCULAR | Status: AC
  Administered 2019-02-21: 20 mg via INTRAVENOUS
  Filled 2019-02-21: qty 2

## 2019-02-21 MED ORDER — SODIUM CHLORIDE 0.9% IV SOLUTION
Freq: Once | INTRAVENOUS | Status: AC
  Administered 2019-02-21: 13:00:00 via INTRAVENOUS

## 2019-02-21 NOTE — Plan of Care (Signed)
Due to the current state of emergency, patients may not be receiving their 3-hours of Medicare-mandated therapy.   

## 2019-02-21 NOTE — Progress Notes (Signed)
Physical Therapy Weekly Progress Note  Patient Details  Name: Joel Rogers MRN: [id number] Date of Birth: 1951-08-09  Beginning of progress report period: February 13, 2019 End of progress report period: February 21, 2019  Today's Date: 02/21/2019 PT Individual Time: 1100-1145 AND 800-915 PT Individual Time Calculation (min): 45 min and 75 min   Patient has met 2 of 3 short term goals.  Pt making slow progress towards LTG. Pt has progressed slightly to require intermittent Mod-max assist for bed mobility, max assist +1 to +2 for SB transfers, min assist for WC mobility up to 36f. Fear of falling, poor endurance, pain in R side ribs 2/2/ CPR, RUE and RLE hemiplegia limit increased progress up to this point.   Patient continues to demonstrate the following deficits muscle weakness and muscle joint tightness, decreased cardiorespiratoy endurance, abnormal tone and unbalanced muscle activation, decreased awareness, decreased problem solving, decreased safety awareness and delayed processing and decreased sitting balance, decreased standing balance, hemiplegia and decreased balance strategies and therefore will continue to benefit from skilled PT intervention to increase functional independence with mobility.  Patient progressing toward long term goals..  Continue plan of care.  PT Short Term Goals Week 1:  PT Short Term Goal 1 (Week 1): Pt will transfer bed<>w/c with max assist +1 using LRAD. PT Short Term Goal 1 - Progress (Week 1): Met PT Short Term Goal 2 (Week 1): Pt will complete bed mobility with mod assist +1. PT Short Term Goal 2 - Progress (Week 1): Progressing toward goal PT Short Term Goal 3 (Week 1): Pt will propel w/c 25 ft with min assist.  PT Short Term Goal 3 - Progress (Week 1): Met Week 2:  PT Short Term Goal 1 (Week 2): Pt will perform bed mobility with Mod assist  PT Short Term Goal 2 (Week 2): Pt will perform bed<>WC transfer with mod assist and LRAD  PT Short Term Goal 3 (Week  2): Pt will ambulate 5 ft with Max assist +2 and LRAD   PT Short Term Goal 4 (Week 2): Pt will propell WC 1055fwith supervision assist   Skilled Therapeutic Interventions/Progress Updates:   Pt received supine in bed and agreeable to PT. Supine>sit transfer with max assist for time management and cus for improved position of the RLE.   Sitting balance EOB x 10 minutes to take medications and don shoes/socks. Lower body dressing sitting EOB with + 2 assition for time management.   [facility] from elevated bed height. Pt noted to have improved coordination of movement, sequencing of movement and decreased pain on this day.   WC mobility x 7571fith min assist intermittently. To improve use of RUE and avoid obstacles on the R.   Sit<>stand in parallel bars x 3 with max assist from PT and max multimodal cues for weight shift, set up, and improved use of momentum. Reciprocal marches in standing x 2 BLE once in standing x 3 bouts. BP assessed over each bout. 106/61, 76bpm; 109/74, 71 bpm, and 105/71, 72bpm. Pt reports extreme fatigue following third bout. PA aware.   Kinetron reciprocal movement training in sitting 4 x 1 minute with 1-2 minute rest break between bouts. Cues for full ROM on the R and proper speed.   Patient returned to room and [facility] reach and all needs met.     Session 2.   Pt received sitting in WC and  agreeable to PT. Pt transported to day room in [facility].   Standing frame 2 x 68mntues BP assessed. Sitting: 105/70, HR 65; standing: 125/56, 79bpm. Returned to sitting 96/70, rest in sitting 117/70, HR 67. Pt reports only mild change in s/s with change in position. Mini squat into sling x 5 with mod cues for full extion in knee and relaxion between reps.   Pt noted to have increased edema in the RUE compared to 3/28. PT re-applied KT tape to the RUE in hand and forearm for edema management. Pt also educated on imporoved  positioning for added benefit between therapies.   [facility] and all needs met.      Ambulation/gait training;Community reintegration;DME/adaptive equipment instruction;Neuromuscular re-education;Psychosocial support;Stair training;UE/LE Strength taining/ROM;Wheelchair propulsion/positioning;Balance/vestibular training;Discharge planning;Functional electrical stimulation;Pain management;Skin care/wound management;Therapeutic Activities;UE/LE Coordination activities;Cognitive remediation/compensation;Disease management/prevention;Functional mobility training;Patient/family education;Splinting/orthotics;Therapeutic Exercise   Therapy Documentation Precautions:  Precautions Precautions: Fall Restrictions Weight Bearing Restrictions: No    Pain: Pain Assessment Pain Scale: 0-10 Pain Score: 0-No pain Faces Pain Scale: No hurt   Therapy/Group: Individual Therapy  ALorie Phenix4/11/2018, 11:50 AM

## 2019-02-21 NOTE — Progress Notes (Signed)
Social Work Patient ID: Joel Rogers, male   DOB: 11-29-1950, 68 y.o.   MRN: [id number] Met with pt and spoke with his wife via telephone to inform team conference goals min-mod level of assist and target discharge date 4/21. Wife is aware pt will need 24 hr physical care and she plans on providing it along with other family members. Aware she will need to come in for education prior to pt's discharge due to amount of care pt requires. Pt doesn't realize how much care he requires currently taking plus tow assist, and unsure if wife if aware of this since she can not see him in his therapies due to COVID-19 restrictions. Will work on discharge needs.

## 2019-02-21 NOTE — Progress Notes (Signed)
Patient has new order for blood and iv lasix push. This nurse went to hang iv fluids and flush iv. The patient explained his iv site was burning. I then put a new consult in for the iv team and removed old I.v. Once I.v team came they informed this writer the patient needed to be in the bed to place new line. I informed him and he refused at that time. Patient stated "he was eating at the time" I explained to the patient the urgency of getting the iv in and blood transfusion. The patient continued to refuse at time stating he was eating. Patient then has a therapy session at 2:45pm for 75 minutes. This nurse is leaving but informed the nurse coming in at 3 about situation. Charge nurse also informed. Kalman Shan, LPN

## 2019-02-21 NOTE — Progress Notes (Signed)
[place] PHYSICAL MEDICINE & REHABILITATION PROGRESS NOTE   Subjective/Complaints:  Feels that he didn't do as well with therapies yesterday.  Might have been related to the fact he did not sleep well the night before.  Denies feeling depressed  ROS: Patient denies fever, rash, sore throat, blurred vision, nausea, vomiting, diarrhea, cough, shortness of breath or chest pain, joint or back pain, headache, or mood change.    Objective:   No results found. Recent Labs    02/20/19 0654 02/21/19 0554  WBC 14.6* 14.3*  HGB 7.1* 7.2*  HCT 21.2* 22.6*  PLT 212 217   Recent Labs    02/20/19 0654 02/21/19 0554  NA 137 135  K 3.3* 3.8  CL 105 104  CO2 24 25  GLUCOSE 101* 98  BUN 14 13  CREATININE 0.82 0.85  CALCIUM 7.9* 8.1*    Intake/Output Summary (Last 24 hours) at 02/21/2019 1048 Last data filed at 02/21/2019 1002 Gross per 24 hour  Intake 582 ml  Output 1525 ml  Net -943 ml     Physical Exam: Vital Signs Blood pressure 113/78, pulse 69, temperature 97.8 F (36.6 C), resp. rate 19, weight 110.9 kg, SpO2 100 %.  Constitutional: No distress . Vital signs reviewed. HEENT: EOMI, oral membranes moist Neck: supple Cardiovascular: Irregular with murmur. No JVD    Respiratory: CTA Bilaterally without wheezes or rales. Normal effort    GI: BS +, non-tender, non-distended  Extremities:1-2+ edema RUE, 1+ edema RLE--no changes Skin: No evidence of breakdown, no evidence of rash Neurologic: Cranial nerves II through XII intact, motor strength is 5/5 in left and 3- RIght  deltoid, bicep, tricep, grip, hip flexor,2- right and 3- left knee extensors, ankle dorsiflexor and plantar flexor-motor and sensory exam stable.   Musculoskeletal: no extremity pain with PROM, chest wall still sore Psych: Pleasant and appropriate   Assessment/Plan: 1. Functional deficits secondary to cardioembolic bicerebral infarcts which require 3+ hours per day of interdisciplinary therapy in a  comprehensive inpatient rehab setting.  Physiatrist is providing close team supervision and 24 hour management of active medical problems listed below.  Physiatrist and rehab team continue to assess barriers to discharge/monitor patient progress toward functional and medical goals  Care Tool:  Bathing    Body parts bathed by patient: Chest, Abdomen, Left upper leg, Face, Right upper leg   Body parts bathed by helper: Front perineal area, Buttocks     Bathing assist Assist Level: 2 Helpers(bed level for perineal hygiene)     Upper Body Dressing/Undressing Upper body dressing Upper body dressing/undressing activity did not occur (including orthotics): Refused What is the patient wearing?: Hospital gown only    Upper body assist Assist Level: Maximal Assistance - Patient 25 - 49%    Lower Body Dressing/Undressing Lower body dressing      What is the patient wearing?: Pants     Lower body assist Assist for lower body dressing: 2 Helpers(sit > stand in [place])     Financial trader Activity did not occur Press photographer and hygiene only): Refused  Toileting assist Assist for toileting: 2 Helpers Assistive Device Comment: BSC(BSC)   Transfers Chair/bed transfer  Transfers assist  Chair/bed transfer activity did not occur: Safety/medical concerns  Chair/bed transfer assist level: Maximal Assistance - Patient 25 - 49%(with SB)     Locomotion Ambulation   Ambulation assist   Ambulation activity did not occur: Safety/medical concerns          Walk 10 feet activity  Assist  Walk 10 feet activity did not occur: Safety/medical concerns        Walk 50 feet activity   Assist Walk 50 feet with 2 turns activity did not occur: Safety/medical concerns         Walk 150 feet activity   Assist Walk 150 feet activity did not occur: Safety/medical concerns         Walk 10 feet on uneven surface  activity   Assist Walk 10 feet on  uneven surfaces activity did not occur: Safety/medical concerns         Wheelchair     Assist Will patient use wheelchair at discharge?: Yes Type of Wheelchair: Manual    Wheelchair assist level: Minimal Assistance - Patient > 75% Max wheelchair distance: 75    Wheelchair 50 feet with 2 turns activity    Assist    Wheelchair 50 feet with 2 turns activity did not occur: Safety/medical concerns(limited by fatigue)   Assist Level: Minimal Assistance - Patient > 75%   Wheelchair 150 feet activity     Assist Wheelchair 150 feet activity did not occur: Safety/medical concerns          Medical Problem List and Plan: 1.Persistent right side weakness with decreased mobilitysecondary to cardioembolic bi-cerebral infarction -Continue CIR therapies including PT, OT, and SLP, team conference today 2. Antithrombotics: -DVT/anticoagulation:ELIQUIS -antiplatelet therapy:  3. Pain Management:Ultracet as needed 4. Mood:Provide emotional support  -appreciate neuropsych input re: coping with episodic stress/anxiety -antipsychotic agents: none 5. Neuropsych: This patientiscapable of making decisions on hisown behalf. 6. Skin/Wound Care:Routine skin checks 7. Fluids/Electrolytes/Nutrition:encourage PO  -3 runs vitamin K 3/30  -Potassium level slowly climbing, up to 3.8 on 4/1  -Follow-up electrolytes on Friday 8. ABLA/ Upper GI bleed EGD 3. Follow-up gastrin neurology services. Continue PPI -serial H/H's--- recheck Friday  -hgb 7.0 3/30, 7.1 3/31--hemoglobin up to 7.2 on 4/1   -Given his fatigue with therapy, I think we need to go ahead and give him some blood.  We will transfuse 1 unit today.   10. Chronic atrial fibrillation with TAVR procedure September 2019- Rate controlled 3/27 11. Non-STEMI. Thought secondary to thrombus status post cardiac catheterization. Continue eliquis 12. Acute on chronic diastolic  congestive heart failure. Lasix 40 mg daily -weights appear to be trending down  -continue to follow closely, lungs clear Filed Weights   02/19/19 0535 02/20/19 0545 02/21/19 0425  Weight: 113.9 kg 112.1 kg 110.9 kg  13. Hypertension. Norvasc 5 mg daily, Lopressor 50 mg twice a day. Monitor with increased mobility Vitals:   02/20/19 1953 02/21/19 0425  BP: 108/74 113/78  Pulse: 74 69  Resp: 18 19  Temp: 98.5 F (36.9 C) 97.8 F (36.6 C)  SpO2: 100% 100%  Controlled 4/1 14. Hyperlipidemia. Lipitor 15. Persistent leukocytosis:  -lungs, urine clear  -afebrile  -may be reactive  -continue to monitor  LOS: 9 days A FACE TO FACE EVALUATION WAS PERFORMED  Ranelle Oyster 02/21/2019, 10:48 AM

## 2019-02-21 NOTE — Progress Notes (Signed)
Speech Language Pathology Daily Session Note  Patient Details  Name: Joel Rogers MRN: [id number] Date of Birth: 08/07/1951  Today's Date: 02/21/2019 SLP Individual Time: 1345-1430 SLP Individual Time Calculation (min): 45 min  Short Term Goals: Week 1: SLP Short Term Goal 1 (Week 1): Pt will utilize compensatory and external memory aids to recall, new daily information with Min A verbal cues. SLP Short Term Goal 2 (Week 1): Pt will complete semi-complex tasks with Mod A verbal cues for functional problem solving.  SLP Short Term Goal 3 (Week 1): Pt will demonstrate intellectual awareness by listing 3 physical and 3 cognitive deficits with Min A cues.  SLP Short Term Goal 4 (Week 1): Pt will demonstrate selective attention for ~ 30 minutes in mildly distracting environment with Min A cues.  SLP Short Term Goal 5 (Week 1): Pt will follow 2 step directions with 80% accuracy and Min A cues.   Skilled Therapeutic Interventions:  Skilled treatment session focused on cognition goals. SLP facilitated session by providing Min A cues to recall physical deficits and Mod A to recall cognitive deficits. Pt appears fatigued, irritable and depressed (d/t no visitors are allowed in hospital as precaution for pandemic). Support provided. Pt able to follow 2 step directions related to room, himself and items on lunch tray. He is able to demonstrate selective attention with Mod I. With calendar present, he is able to locate information with supervision questions. Pt left upright in wheelchair, consuming lunch with all needs within reach. Continue per current plan of care.      Pain Pain Assessment Pain Scale: 0-10 Pain Score: 0-No pain  Therapy/Group: Individual Therapy  Omayra Tulloch 02/21/2019, 2:17 PM

## 2019-02-21 NOTE — Patient Care Conference (Signed)
Inpatient RehabilitationTeam Conference and Plan of Care Update Date: 02/21/2019   Time: 11;00 am    Patient Name: Joel Rogers      Medical Record Number: [id number]  Date of Birth: July 22, 1951 Sex: Male         Room/Bed: 4W22C/4W22C-01 Payor Info: Payor: Advertising copywriter MEDICARE / Plan: [employer] MEDICARE / Product Type: *No Product type* /    Admitting Diagnosis: nultiple infarcts recent cardiac arrest mi  Admit Date/Time:  02/12/2019  2:30 PM Admission Comments: No comment available   Primary Diagnosis:  <principal problem not specified> Principal Problem: <principal problem not specified>  Patient Active Problem List   Diagnosis Date Noted  . Posterior circulation stroke (HCC) 02/12/2019  . Cerebral embolism with cerebral infarction 02/09/2019  . Malnutrition of moderate degree 02/08/2019  . Pressure injury of skin 02/07/2019  . VT (ventricular tachycardia) (HCC)   . Acute coronary syndrome (HCC) 02/03/2019  . VF (ventricular fibrillation) (HCC) causing cardiac arrest 02/03/2019  . Acute bilateral cerebral infarction in a watershed distribution [facility]) 01/30/2019  . UGIB (upper gastrointestinal bleed) 01/26/2019  . Bacteremia due to Streptococcus 01/26/2019  . Cellulitis 01/26/2019  . Upper GI bleed   . Acute CVA (cerebrovascular accident) (HCC) 01/19/2019  . Acute renal failure (HCC) 01/14/2019  . Right shoulder pain 01/14/2019  . Sepsis (HCC) 01/13/2019  . S/P TAVR (transcatheter aortic valve replacement) 07/25/2018  . History of subdural hematoma   . Atrial fibrillation, chronic   . Acute on chronic diastolic heart failure (HCC) 06/15/2018  . Pulmonary hypertension, unspecified (HCC) 02/15/2018  . Varicose veins of right lower extremity with complications 01/10/2018  . History of Severe aortic stenosis 08/09/2017  . Vitamin D deficiency 12/02/2016  . Status post gastric bypass for obesity 11/02/2016  . HTN (hypertension) 04/13/2013  . Hyperlipemia 04/13/2013  . Chronic  atrial fibrillation 02/14/2013  . Non-ST elevation (NSTEMI) myocardial infarction (HCC) 06/25/2008  . Morbid obesity (HCC) 06/18/2008  . Obstructive sleep apnea 06/18/2008    Expected Discharge Date: Expected Discharge Date: 03-23-2019  Team Members Present: Physician leading conference: Dr. Maryla Morrow Social Worker Present: Dossie Der, LCSW Nurse Present: Doran Durand, LPN PT Present: Grier Rocher, PT;[name], PTA OT Present: Jackquline Denmark, OT SLP Present: Reuel Derby, SLP PPS Coordinator present : Edson Snowball, PT     Current Status/Progress Goal Weekly Team Focus  Medical   Patient with some ongoing fatigue.  May be related to his ongoing anemia with hemoglobin hovering around 7.  Ongoing weakness due to his Bi cerebral infarcts.  Heart rate remains under control and weights are stable  Maximize activity tolerance  Following hemoglobin and GI bleeding.  Transfusing 1 unit of packed red blood cells today.  Monitoring weights and mood   Bowel/Bladder   continenet of bladder with episodes of incontinence, was continent tonight, LBM 02/20/19, documenet black stools  less episodes of incontinence  continue times toileting   Swallow/Nutrition/ Hydration             ADL's   2 helpers for ADLs bedlevel + EOB. 2 assist for sit<stand in [place].   Min assist bathing/dressing and transfers, supervision grooming and sitting balance  ADL retraining, Rt NMR, functional transfers, activity tolerance    Mobility   +2 bed mobility, +2 stedy transfer, modA w/c mobility, maxA x 2 SB transfer  currently set for Min assist bed mobility, transfers, and gait. (may need to be downgraded)   endurance, strength, R NMR, w/c mobility   Communication  Safety/Cognition/ Behavioral Observations  Mod A for semi-complex problem solving, selective attention, awareness, use of compensatory memory aids  Min A  semi-complex problem solving, attention, memory aids   Pain   no c/o pain,  has ultracet 1-2 tabs prn, has not used since 02/16/19  pain scale <4/10  assess & treat as needed   Skin   small ulcer to scarum, wound with pink wound bed, has a foam drsg, generalized edema with +3-4 pitting to BLE, more so to the RLE, dry flaky skin to BLE  no signs of infection & no new areas of skin break down  assess q shift      *See Care Plan and progress notes for long and short-term goals.     Barriers to Discharge  Current Status/Progress Possible Resolutions Date Resolved   Physician    Medical stability        Continue to manage medical issues as outlined in medical progress note      Nursing                  PT  Inaccessible home environment;Decreased caregiver support;Home environment access/layout                 OT Weight;Medical stability;Incontinence;Lack of/limited family support                SLP                SW                Discharge Planning/Teaching Needs:  Wife plans to take pt home and provide assist, she took care of her Mom when she was ill before her death.      Team Discussion:  Progressing some in therapies at times pain limits him and fatigues easily. Neuro-psych seeing for depression issues. Currently total assist goals still min assist level but may need to downgrade to mod. No skin issues. And hemo-stable according to MD. Question will be if wife can provide the care he will need at DC. She wants too.  Revisions to Treatment Plan:  DC 4/21    Continued Need for Acute Rehabilitation Level of Care: The patient requires daily medical management by a physician with specialized training in physical medicine and rehabilitation for the following conditions: Daily direction of a multidisciplinary physical rehabilitation program to ensure safe treatment while eliciting the highest outcome that is of practical value to the patient.: Yes Daily medical management of patient stability for increased activity during participation in an intensive  rehabilitation regime.: Yes Daily analysis of laboratory values and/or radiology reports with any subsequent need for medication adjustment of medical intervention for : Neurological problems;Cardiac problems;Blood pressure problems;Mood/behavior problems   I attest that I was present, lead the team conference, and concur with the assessment and plan of the team. Teleconference held due to COVID-19   Lucy Chris 02/21/2019, 2:07 PM

## 2019-02-21 NOTE — Progress Notes (Signed)
Patient ID: Joel Rogers, male   DOB: Apr 02, 1951, 68 y.o.   MRN: [id number] Spoke with Cline Crock PA. Ok to cancel order for echocardiogram. Patient has had 2 two echocardiograms, one transesphogeal echocardiogram and left heart cath in the last year. The last echo was performed 02/05/2019. The TAVR was assessed in each echo and TEE. Patient is at the moment admitted at [facility].

## 2019-02-21 NOTE — Progress Notes (Signed)
Occupational Therapy Weekly Progress Note  Patient Details  Name: Joel Rogers MRN: [id number] Date of Birth: Jul 03, 1951  Beginning of progress report period: February 13, 2019 End of progress report period: February 21, 2019  Today's Date: 02/21/2019 OT Individual Time: 1445-1540 OT Individual Time Calculation (min): 55 min  and Today's Date: 02/21/2019 OT Missed Time: 20 Minutes Missed Time Reason: Patient fatigue   Patient has met 1 of 4 short term goals.  Pt making slow progress towards goals this week and has been self limiting during therapy sessions. Pt reports feeling very "down" as well but has been seeing neuropsych. Pt currently on night bath with UB dressing at max A and LB self care with total A for bed level. Pt has declined slideboard transfers secondary to pain during occupational therapy sessions. Pt needing +2 for transfer out of bed with maxi move or sit <>stand in stedy. Kinesiotape utilized on R UE along with retrograde massage to decrease edema in order to increase participation in [place].  Patient continues to demonstrate the following deficits: muscle weakness, decreased cardiorespiratoy endurance, decreased coordination and decreased motor planning and decreased sitting balance, decreased standing balance, hemiplegia and decreased balance strategies and therefore will continue to benefit from skilled OT intervention to enhance overall performance with BADL and Reduce care partner burden.  Patient progressing toward long term goals..  Continue plan of care.  OT Short Term Goals Week 1:  OT Short Term Goal 1 (Week 1): Pt will complete sit > stand with max assist +2 to decrease burden of care for LB bathing/dressing OT Short Term Goal 1 - Progress (Week 1): Met OT Short Term Goal 2 (Week 1): Pt will complete UB dressing with mod assist OT Short Term Goal 2 - Progress (Week 1): Not met OT Short Term Goal 3 (Week 1): Pt will complete toilet transfer with max assist +2 OT Short  Term Goal 3 - Progress (Week 1): Not met OT Short Term Goal 4 (Week 1): Pt will complete bathing with mod assist from bed level.   OT Short Term Goal 4 - Progress (Week 1): Not met Week 2:  OT Short Term Goal 1 (Week 2): Pt will perform slide board transfer with max A consistently.  OT Short Term Goal 2 (Week 2): Pt will sit EOB with min A for sitting balance for 5 minutes during self care tasks.  OT Short Term Goal 3 (Week 2): Pt will perform UB dressing with mod A .  Skilled Therapeutic Interventions/Progress Updates:    Upon entering the room, pt seated in recliner chair. Pt reports, " I just don't have it in me right now." Pt declining OT intervention and OT attempting to motivate pt for participation. Pt verbalized feelings of "depression since I can't see my family or walk." OT provided therapeutic use of self and pt very emotional. OT provided retrograde massage to R hand secondary to edema while pt discussed current situation. OT providing pt with goals set by therapist and pt verbalized goals and asked, " How do we meet these without participation?" Pt continued to declined and transferred back to bed via maxi move. Pt rolling to the R with use of bed rail with mod A for therapist to remove sling. IV team coming for blood transfusion. Call bell and all needed items within reach upon exiting the room.   Therapy Documentation Precautions:  Precautions Precautions: Fall Restrictions Weight Bearing Restrictions: No General: General OT Amount of Missed Time: 20 Minutes Vital  Signs: Therapy Vitals Temp: 98.3 F (36.8 C) Temp Source: Oral Pulse Rate: 65 Resp: 18 BP: 105/71 Patient Position (if appropriate): Lying Oxygen Therapy SpO2: 100 % O2 Device: Room Air Pain: Pain Assessment Pain Scale: 0-10 Pain Score: 0-No pain ADL: ADL Upper Body Bathing: Maximal assistance Where Assessed-Upper Body Bathing: Bed level Lower Body Bathing: Dependent Where Assessed-Lower Body Bathing:  Bed level Upper Body Dressing: Maximal assistance Where Assessed-Upper Body Dressing: Bed level Lower Body Dressing: Dependent Where Assessed-Lower Body Dressing: Bed level   Therapy/Group: Individual Therapy  Joel Rogers 02/21/2019, 5:14 PM

## 2019-02-22 ENCOUNTER — Inpatient Hospital Stay (HOSPITAL_COMMUNITY): Payer: Self-pay | Admitting: Occupational Therapy

## 2019-02-22 ENCOUNTER — Inpatient Hospital Stay (HOSPITAL_COMMUNITY): Payer: Self-pay | Admitting: Speech Pathology

## 2019-02-22 ENCOUNTER — Inpatient Hospital Stay (HOSPITAL_COMMUNITY): Payer: Self-pay | Admitting: Physical Therapy

## 2019-02-22 LAB — BPAM RBC
Blood Product Expiration Date: 202004222359
ISSUE DATE / TIME: 202004011630
Unit Type and Rh: 5100

## 2019-02-22 LAB — TYPE AND SCREEN
ABO/RH(D): O POS
Antibody Screen: NEGATIVE
Unit division: 0

## 2019-02-22 NOTE — Progress Notes (Addendum)
Physical Therapy Session Note  Patient Details  Name: Joel Rogers MRN: [id number] Date of Birth: 01-Dec-1950  Today's Date: 02/22/2019 PT Individual Time: 1135-1230  AND 1515-1600  PT Individual Time Calculation (min): 55 min   Short Term Goals: Week 2:  PT Short Term Goal 1 (Week 2): Pt will perform bed mobility with Mod assist  PT Short Term Goal 2 (Week 2): Pt will perform bed<>WC transfer with mod assist and LRAD  PT Short Term Goal 3 (Week 2): Pt will ambulate 5 ft with Max assist +2 and LRAD   PT Short Term Goal 4 (Week 2): Pt will propell WC 132f with supervision assist   Skilled Therapeutic Interventions/Progress Updates:  Session 1  Pt received sitting in WC and agreeable to PT. [facility] table with level transfer and max assist of 1. Max cues for UE placement, LE placement and sequencing. Lateral scooting EOM x 324fto position pt under maxisky. Mod-max assist overall with max cues for anterior weight shift as well as increased time for rest breaks due to fatigue. Sit>stand from elevated bed x 2 with walking sling in place and mod assist from PT. Gait training with RW x 1 ft and then requires seated rest break due to fear of falling. additional [id number] sling in place and max assist from PT to stabilize the RLE in stance to advance the LLE. Unable to sustain knee extension in BLE after [id number] to WC [facility]. Patient returned to room and [facility] reach and all needs met.    Session 2  Pt received sitting in WC and agreeable to PT. Pt performed WC mobility x 120ft52fh supervision assist moderate cues for improved use of RU to maintain straight path. Increased time due to RUE fatigue and weakness, pt able to navigate 2 obstacles and 1 90deg turn.   Sit<>stand in standing frame x 2. Standing tolerance while engaged in UE fine motor task of connect four. Pt able to maintain standing for ~6 min before  requiring rest break. Cues for UE placement and use of postural muscles as well as increased activation of trunk.  Mini squat>stand from sling x 5 with min assist from PT and moderate cues for erect posture.   Patient returned to room and [facility] reach and all needs met.           Therapy Documentation Precautions:  Precautions Precautions: Fall Restrictions Weight Bearing Restrictions: No Pain: Pain Assessment Pain Scale: 0-10 Pain Score: 0-No pain    Therapy/Group: Individual Therapy  AustiLorie Phenix2020, 12:43 PM

## 2019-02-22 NOTE — Progress Notes (Signed)
Speech Language Pathology Weekly Progress and Session Note  Patient Details  Name: Joel Rogers MRN: [id number] Date of Birth: February 13, 1951  Beginning of progress report period: February 15, 2019 End of progress report period: February 22, 2019  Today's Date: 02/22/2019 SLP Individual Time: 0272-5366 SLP Individual Time Calculation (min): 60 min  Short Term Goals: Week 1: SLP Short Term Goal 1 (Week 1): Pt will utilize compensatory and external memory aids to recall, new daily information with Min A verbal cues. SLP Short Term Goal 1 - Progress (Week 1): Progressing toward goal SLP Short Term Goal 2 (Week 1): Pt will complete semi-complex tasks with Mod A verbal cues for functional problem solving.  SLP Short Term Goal 2 - Progress (Week 1): Progressing toward goal SLP Short Term Goal 3 (Week 1): Pt will demonstrate intellectual awareness by listing 3 physical and 3 cognitive deficits with Min A cues.  SLP Short Term Goal 3 - Progress (Week 1): Progressing toward goal SLP Short Term Goal 4 (Week 1): Pt will demonstrate selective attention for ~ 30 minutes in mildly distracting environment with Min A cues.  SLP Short Term Goal 4 - Progress (Week 1): Met SLP Short Term Goal 5 (Week 1): Pt will follow 2 step directions with 80% accuracy and Min A cues.  SLP Short Term Goal 5 - Progress (Week 1): Met    New Short Term Goals: Week 2: SLP Short Term Goal 1 (Week 2): Pt will utilize compensatory and external memory aids to recall, new daily information with Min A verbal cues. SLP Short Term Goal 2 (Week 2): Pt will complete semi-complex tasks with Mod A verbal cues for functional problem solving.  SLP Short Term Goal 3 (Week 2): Pt will demonstrate intellectual awareness by listing 3 physical and 3 cognitive deficits with Min A cues.  SLP Short Term Goal 4 (Week 2): Pt will demonstrate selective attention for ~ 30 minutes in moderately distracting environment with Min A cues.  SLP Short Term Goal 5  (Week 2): Pt will self-monitor and self-correct erros in 7 out of 10 opportunities with Mod A cues.   Weekly Progress Updates:  Pt has made progress in skilled ST this reporting period and as a result he has met 2 out of 5 STGs. He has made progress with demonstrating selective attention in mildly distracting environment as well as following 2 step directions. Overall he continues to be Mod A for semi-complex tasks, awareness and overall task tolerance. Skilled ST is required to target the above mentioned deficits, increase functional independence and reduce caregiver burden.        Intensity: Minumum of 1-2 x/day, 30 to 90 minutes Frequency: 3 to 5 out of 7 days Duration/Length of Stay: 4/21 Treatment/Interventions: Cognitive remediation/compensation;Medication managment;Functional tasks;Internal/external aids;Environmental controls   Daily Session  Skilled Therapeutic Interventions: Skilled treatment session focused on cognition goals. SLP facilitated session by providing Min A to Mod A and more than a reasonable amount of time to complete semi-complex medication management tasks. Pt with no emergent awareness but with Mod A cues he was able to problem solve which pills he missed. Pt left upright in wheelchair, lap belt alarm on and all needs within reach. Continue per current plan of care.      General    Pain Pain Assessment Pain Scale: 0-10 Pain Score: 0-No pain  Therapy/Group: Individual Therapy  Ritaj Dullea 02/22/2019, 10:39 AM

## 2019-02-22 NOTE — Progress Notes (Signed)
[place] PHYSICAL MEDICINE & REHABILITATION PROGRESS NOTE   Subjective/Complaints:  Pt states he's feeling better today. He is feeling stress of personal and social matters which "gets to me " sometimes. Lost a family member yesterday to illness.   ROS: Patient denies fever, rash, sore throat, blurred vision, nausea, vomiting, diarrhea, cough, shortness of breath or chest pain, joint or back pain, headache      Objective:   No results found. Recent Labs    02/20/19 0654 02/21/19 0554  WBC 14.6* 14.3*  HGB 7.1* 7.2*  HCT 21.2* 22.6*  PLT 212 217   Recent Labs    02/20/19 0654 02/21/19 0554  NA 137 135  K 3.3* 3.8  CL 105 104  CO2 24 25  GLUCOSE 101* 98  BUN 14 13  CREATININE 0.82 0.85  CALCIUM 7.9* 8.1*    Intake/Output Summary (Last 24 hours) at 02/22/2019 1028 Last data filed at 02/22/2019 1022 Gross per 24 hour  Intake 815 ml  Output 1175 ml  Net -360 ml     Physical Exam: Vital Signs Blood pressure 109/76, pulse 71, temperature 97.9 F (36.6 C), temperature source Oral, resp. rate 19, weight 111 kg, SpO2 100 %.  Constitutional: No distress . Vital signs reviewed. HEENT: EOMI, oral membranes moist Neck: supple Cardiovascular: RRR without murmur. No JVD    Respiratory: CTA Bilaterally without wheezes or rales. Normal effort    GI: BS +, non-tender, non-distended  Extremities:1-2+ edema RUE, 1+ edema RLE--stable exam Skin: No evidence of breakdown, no evidence of rash Neurologic: Cranial nerves II through XII intact, motor strength is 5/5 in left and 3- RIght  deltoid, bicep, tricep, grip, hip flexor,2- right and 3- left knee extensors, ankle dorsiflexor and plantar flexor-motor and sensory exam stable.   Musculoskeletal: no extremity pain with PROM, sternum sore with palp Psych: mood brighter today   Assessment/Plan: 1. Functional deficits secondary to cardioembolic bicerebral infarcts which require 3+ hours per day of interdisciplinary therapy in a  comprehensive inpatient rehab setting.  Physiatrist is providing close team supervision and 24 hour management of active medical problems listed below.  Physiatrist and rehab team continue to assess barriers to discharge/monitor patient progress toward functional and medical goals  Care Tool:  Bathing    Body parts bathed by patient: Chest, Abdomen, Left upper leg, Face, Right upper leg   Body parts bathed by helper: Front perineal area, Buttocks     Bathing assist Assist Level: 2 Helpers(bed level for perineal hygiene)     Upper Body Dressing/Undressing Upper body dressing Upper body dressing/undressing activity did not occur (including orthotics): Refused What is the patient wearing?: Hospital gown only    Upper body assist Assist Level: Maximal Assistance - Patient 25 - 49%    Lower Body Dressing/Undressing Lower body dressing      What is the patient wearing?: Pants     Lower body assist Assist for lower body dressing: 2 Helpers(sit > stand in [place])     Financial trader Activity did not occur Press photographer and hygiene only): Refused  Toileting assist Assist for toileting: 2 Helpers Assistive Device Comment: BSC(BSC)   Transfers Chair/bed transfer  Transfers assist  Chair/bed transfer activity did not occur: Safety/medical concerns  Chair/bed transfer assist level: Dependent - mechanical lift     Locomotion Ambulation   Ambulation assist   Ambulation activity did not occur: Safety/medical concerns          Walk 10 feet activity   Assist  Walk 10 feet activity did not occur: Safety/medical concerns        Walk 50 feet activity   Assist Walk 50 feet with 2 turns activity did not occur: Safety/medical concerns         Walk 150 feet activity   Assist Walk 150 feet activity did not occur: Safety/medical concerns         Walk 10 feet on uneven surface  activity   Assist Walk 10 feet on uneven surfaces  activity did not occur: Safety/medical concerns         Wheelchair     Assist Will patient use wheelchair at discharge?: Yes Type of Wheelchair: Manual    Wheelchair assist level: Minimal Assistance - Patient > 75% Max wheelchair distance: 75    Wheelchair 50 feet with 2 turns activity    Assist    Wheelchair 50 feet with 2 turns activity did not occur: Safety/medical concerns(limited by fatigue)   Assist Level: Minimal Assistance - Patient > 75%   Wheelchair 150 feet activity     Assist Wheelchair 150 feet activity did not occur: Safety/medical concerns          Medical Problem List and Plan: 1.Persistent right side weakness with decreased mobilitysecondary to cardioembolic bi-cerebral infarction -Continue CIR therapies including PT, OT, and SLP. Slow progress  2. Antithrombotics: -DVT/anticoagulation:ELIQUIS -antiplatelet therapy:  3. Pain Management:Ultracet as needed 4. Mood:Provide emotional support  -appreciate neuropsych input re: coping with episodic stress/anxiety -antipsychotic agents: none 5. Neuropsych: This patientiscapable of making decisions on hisown behalf. 6. Skin/Wound Care:Routine skin checks 7. Fluids/Electrolytes/Nutrition:encourage PO  -3 runs vitamin K 3/30  -Potassium level slowly climbing, up to 3.8 on 4/1  -check labs tomorrow 8. ABLA/ Upper GI bleed EGD 3. Follow-up gastrin neurology services. Continue PPI -serial H/H's--- recheck Friday  -hgb 7.0 3/30, 7.1 3/31--hemoglobin up to 7.2 on 4/1   -1u PRBC yesterday---recheck hgb 4/3   10. Chronic atrial fibrillation with TAVR procedure September 2019- Rate controlled 4/3 11. Non-STEMI. Thought secondary to thrombus status post cardiac catheterization. Continue eliquis 12. Acute on chronic diastolic congestive heart failure. Lasix 40 mg daily -weights appear to be trending down  -  lungs clear Filed  Weights   02/20/19 0545 02/21/19 0425 02/22/19 0433  Weight: 112.1 kg 110.9 kg 111 kg  13. Hypertension. Norvasc 5 mg daily, Lopressor 50 mg twice a day. Monitor with increased mobility Vitals:   02/22/19 0258 02/22/19 0430  BP: 109/76   Pulse: 61 71  Resp: 19   Temp: 97.9 F (36.6 C)   SpO2: 100% 100%  Controlled 4/2 14. Hyperlipidemia. Lipitor 15. Persistent leukocytosis:  -lungs, urine clear  -afebrile  -may be reactive  -continue to monitor  LOS: 10 days A FACE TO FACE EVALUATION WAS PERFORMED  Ranelle Oyster 02/22/2019, 10:28 AM

## 2019-02-22 NOTE — Progress Notes (Signed)
Occupational Therapy Session Note  Patient Details  Name: Joel Rogers MRN: [id number] Date of Birth: 09-Apr-1951  Today's Date: 02/22/2019 OT Individual Time: 3500-9381 OT Individual Time Calculation (min): 60 min    Short Term Goals: Week 2:  OT Short Term Goal 1 (Week 2): Pt will perform slide board transfer with max A consistently.  OT Short Term Goal 2 (Week 2): Pt will sit EOB with min A for sitting balance for 5 minutes during self care tasks.  OT Short Term Goal 3 (Week 2): Pt will perform UB dressing with mod A .   Skilled Therapeutic Interventions/Progress Updates:    Upon entering the room, pt supine in bed with no c/o pain and agreeable to OT intervention this session. Pt donning pull over pants with total A but able to roll L <> R with use of bed rails with mod A. Supine >sit with mod A to EOB and static sitting balance close supervision. Pt verbalized steps of slide board transfer and OT placing board for proper set up. Pt performed slide board transfer with max A to the R with min verbal cuing for head hip relationship. Pt did much better if he counted to know when to assist each time. Pt seated in wheelchair at sink for grooming tasks with supervision and use of B UEs to complete tasks. Pt donning pull over shirt with cuing to dress R UE first and needing mod A to thread over L UE as well secondary to IV placement. Pt remaining seated in wheelchair with call bell and all needed items within reach.   Therapy Documentation Precautions:  Precautions Precautions: Fall Restrictions Weight Bearing Restrictions: No    Pain: Pain Assessment Pain Scale: 0-10 Pain Score: 0-No pain ADL: ADL Upper Body Bathing: Maximal assistance Where Assessed-Upper Body Bathing: Bed level Lower Body Bathing: Dependent Where Assessed-Lower Body Bathing: Bed level Upper Body Dressing: Maximal assistance Where Assessed-Upper Body Dressing: Bed level Lower Body Dressing: Dependent Where  Assessed-Lower Body Dressing: Bed level   Therapy/Group: Individual Therapy  [name] 02/22/2019, 11:57 AM

## 2019-02-23 ENCOUNTER — Inpatient Hospital Stay (HOSPITAL_COMMUNITY): Payer: Self-pay | Admitting: Occupational Therapy

## 2019-02-23 ENCOUNTER — Inpatient Hospital Stay (HOSPITAL_COMMUNITY): Payer: Self-pay | Admitting: Speech Pathology

## 2019-02-23 ENCOUNTER — Inpatient Hospital Stay (HOSPITAL_COMMUNITY): Payer: Self-pay

## 2019-02-23 ENCOUNTER — Inpatient Hospital Stay (HOSPITAL_COMMUNITY): Payer: Self-pay | Admitting: Physical Therapy

## 2019-02-23 LAB — CBC
HCT: 24.7 % — ABNORMAL LOW (ref 39.0–52.0)
Hemoglobin: 7.9 g/dL — ABNORMAL LOW (ref 13.0–17.0)
MCH: 26 pg (ref 26.0–34.0)
MCHC: 32 g/dL (ref 30.0–36.0)
MCV: 81.3 fL (ref 80.0–100.0)
Platelets: 206 10*3/uL (ref 150–400)
RBC: 3.04 MIL/uL — ABNORMAL LOW (ref 4.22–5.81)
RDW: 18.5 % — ABNORMAL HIGH (ref 11.5–15.5)
WBC: 12.9 10*3/uL — ABNORMAL HIGH (ref 4.0–10.5)
nRBC: 0 % (ref 0.0–0.2)

## 2019-02-23 LAB — BASIC METABOLIC PANEL
Anion gap: 5 (ref 5–15)
BUN: 17 mg/dL (ref 8–23)
CO2: 23 mmol/L (ref 22–32)
Calcium: 8.1 mg/dL — ABNORMAL LOW (ref 8.9–10.3)
Chloride: 108 mmol/L (ref 98–111)
Creatinine, Ser: 0.89 mg/dL (ref 0.61–1.24)
GFR calc Af Amer: >60 mL/min (ref >60)
GFR calc non Af Amer: >60 mL/min (ref >60)
Glucose, Bld: 112 mg/dL — ABNORMAL HIGH (ref 70–99)
Potassium: 4.1 mmol/L (ref 3.5–5.1)
Sodium: 136 mmol/L (ref 135–145)

## 2019-02-23 NOTE — Progress Notes (Signed)
PHYSICAL MEDICINE & REHABILITATION PROGRESS NOTE   Subjective/Complaints:  Had another good day. Feels that he was able to tolerate therapies well yesterday. In better spirits.      Objective:   No results found. Recent Labs    02/21/19 0554 02/23/19 0934  WBC 14.3* 12.9*  HGB 7.2* 7.9*  HCT 22.6* 24.7*  PLT 217 206   Recent Labs    02/21/19 0554 02/23/19 0934  NA 135 136  K 3.8 4.1  CL 104 108  CO2 25 23  GLUCOSE 98 112*  BUN 13 17  CREATININE 0.85 0.89  CALCIUM 8.1* 8.1*    Intake/Output Summary (Last 24 hours) at 02/23/2019 1102 Last data filed at 02/23/2019 1007 Gross per 24 hour  Intake 720 ml  Output 1700 ml  Net -980 ml     Physical Exam: Vital Signs Blood pressure 133/78, pulse 62, temperature 99.2 F (37.3 C), temperature source Oral, resp. rate 16, weight 111 kg, SpO2 100 %.  Constitutional: No distress . Vital signs reviewed. HEENT: EOMI, oral membranes moist Neck: supple Cardiovascular: RRR without murmur. No JVD    Respiratory: CTA Bilaterally without wheezes or rales. Normal effort    GI: BS +, non-tender, non-distended  Extremities: 1+  edema RUE (improved), 1+  To 2+ edema RLE--stable on exam Skin: No evidence of breakdown, no evidence of rash Neurologic: Cranial nerves II through XII intact, motor strength is 5/5 in left and 3- RIght  deltoid, bicep, tricep, grip, hip flexor,2- right and 3- left knee extensors, ankle dorsiflexor and plantar flexor-motor and sensory exam stable   Musculoskeletal: no extremity pain with PROM, sternum remains sore with palp Psych: smiling and in good spirits   Assessment/Plan: 1. Functional deficits secondary to cardioembolic bicerebral infarcts which require 3+ hours per day of interdisciplinary therapy in a comprehensive inpatient rehab setting.  Physiatrist is providing close team supervision and 24 hour management of active medical problems listed below.  Physiatrist and rehab team continue  to assess barriers to discharge/monitor patient progress toward functional and medical goals  Care Tool:  Bathing    Body parts bathed by patient: Chest, Abdomen, Left upper leg, Face, Right upper leg   Body parts bathed by helper: Front perineal area, Buttocks     Bathing assist Assist Level: 2 Helpers(bed level for perineal hygiene)     Upper Body Dressing/Undressing Upper body dressing Upper body dressing/undressing activity did not occur (including orthotics): Refused What is the patient wearing?: Pull over shirt    Upper body assist Assist Level: Moderate Assistance - Patient 50 - 74%    Lower Body Dressing/Undressing Lower body dressing      What is the patient wearing?: Pants     Lower body assist Assist for lower body dressing: Total Assistance - Patient < 25%     Toileting Toileting Toileting Activity did not occur Press photographer and hygiene only): Refused  Toileting assist Assist for toileting: 2 Helpers Assistive Device Comment: BSC(BSC)   Transfers Chair/bed transfer  Transfers assist  Chair/bed transfer activity did not occur: Safety/medical concerns  Chair/bed transfer assist level: Maximal Assistance - Patient 25 - 49%     Locomotion Ambulation   Ambulation assist   Ambulation activity did not occur: Safety/medical concerns  Assist level: Dependent - Patient 0%(maxi sky walking sling ) Assistive device: Maxi Sky Max distance: 45ft    Walk 10 feet activity   Assist  Walk 10 feet activity did not occur: Safety/medical concerns  Walk 50 feet activity   Assist Walk 50 feet with 2 turns activity did not occur: Safety/medical concerns         Walk 150 feet activity   Assist Walk 150 feet activity did not occur: Safety/medical concerns         Walk 10 feet on uneven surface  activity   Assist Walk 10 feet on uneven surfaces activity did not occur: Safety/medical concerns         Wheelchair     Assist  Will patient use wheelchair at discharge?: Yes Type of Wheelchair: Manual    Wheelchair assist level: Minimal Assistance - Patient > 75% Max wheelchair distance: 75    Wheelchair 50 feet with 2 turns activity    Assist    Wheelchair 50 feet with 2 turns activity did not occur: Safety/medical concerns(limited by fatigue)   Assist Level: Minimal Assistance - Patient > 75%   Wheelchair 150 feet activity     Assist Wheelchair 150 feet activity did not occur: Safety/medical concerns          Medical Problem List and Plan: 1.Persistent right side weakness with decreased mobilitysecondary to cardioembolic bi-cerebral infarction -Continue CIR therapies including PT, OT, and SLP.    2. Antithrombotics: -DVT/anticoagulation:ELIQUIS -antiplatelet therapy:  3. Pain Management:Ultracet as needed 4. Mood:Provide emotional support  -appreciate neuropsych input re: coping with episodic stress/anxiety -antipsychotic agents: none 5. Neuropsych: This patientiscapable of making decisions on hisown behalf. 6. Skin/Wound Care:Routine skin checks 7. Fluids/Electrolytes/Nutrition:encourage PO  -3 runs vitamin K 3/30  -Potassium level improving, up to 3.8 on 4/1---4.1 4/3   -recheck Monday  8. ABLA/ Upper GI bleed EGD 3. Follow-up gastrin neurology services. Continue PPI -serial H/H's   -hgb 7.0 3/30, 7.1 3/31--hemoglobin up to 7.2 on 4/1 and 7.9 on 4/3  -1u PRBC Wednesday- 4/1   10. Chronic atrial fibrillation with TAVR procedure September 2019- Rate controlled 4/3 11. Non-STEMI. Thought secondary to thrombus status post cardiac catheterization. Continue eliquis 12. Acute on chronic diastolic congestive heart failure. Lasix 40 mg daily -weights appear to be generally stable  -  lungs clear Filed Weights   02/21/19 0425 02/22/19 0433 02/23/19 0434  Weight: 110.9 kg 111 kg 111 kg  13. Hypertension. Norvasc 5 mg  daily, Lopressor 50 mg twice a day. Monitor with increased mobility Vitals:   02/22/19 1933 02/23/19 0434  BP: 117/79 133/78  Pulse: 64 62  Resp: 15 16  Temp: 98.8 F (37.1 C) 99.2 F (37.3 C)  SpO2: 100% 100%  Controlled 4/3 14. Hyperlipidemia. Lipitor 15. Persistent leukocytosis: wbc's down to 12.9 4/3  -lungs, urine clear  -afebrile  -may be reactive  -continue to monitor  LOS: 11 days A FACE TO FACE EVALUATION WAS PERFORMED  Ranelle Oyster 02/23/2019, 11:02 AM

## 2019-02-23 NOTE — Progress Notes (Signed)
Physical Therapy Session Note  Patient Details  Name: Joel Rogers MRN: [id number] Date of Birth: [birth year]  Today's Date: 02/23/2019 PT Individual Time: 1110-1205 AND 1535-1615 PT Individual Time Calculation (min): 55 min AND 40 min   Short Term Goals: Week 1:  PT Short Term Goal 1 (Week 1): Pt will transfer bed<>w/c with max assist +1 using LRAD. PT Short Term Goal 1 - Progress (Week 1): Met PT Short Term Goal 2 (Week 1): Pt will complete bed mobility with mod assist +1. PT Short Term Goal 2 - Progress (Week 1): Progressing toward goal PT Short Term Goal 3 (Week 1): Pt will propel w/c 25 ft with min assist.  PT Short Term Goal 3 - Progress (Week 1): Met Week 2:  PT Short Term Goal 1 (Week 2): Pt will perform bed mobility with Mod assist  PT Short Term Goal 2 (Week 2): Pt will perform bed<>WC transfer with mod assist and LRAD  PT Short Term Goal 3 (Week 2): Pt will ambulate 5 ft with Max assist +2 and LRAD   PT Short Term Goal 4 (Week 2): Pt will propell WC 130f with supervision assist   Skilled Therapeutic Interventions/Progress Updates: Session 1  Pt received supine in bed and agreeable to PT. PT applied ted hose supine in bed. Supine>sit transfer with  Mod assist and heavy use of bed rail.  Lateral weight shift R and L to don pants with + 2 assist for time management.  [facility] with moderate cues for improved anterior weight shift to allow improved use of BLE.   Pt transported to rehab gym. PT placed maxi sky bariatric walking sling and [facility] Maxi sky transfer to mat table. Sit<>stand from elevated mat and maxisky walking sling on pt x 5. BUE supported and RW and heavy support from walking sling. Pt performed small standing march through BLE. With PT to support R knee in standing.   [facility]. [facility] and all needs met.    Session 2.  Pt received sitting in WC and agreeable to PT. [facility] to and from Nustep with max assist and max cues for improved use of BLE to lift glutes off SB and improve ease of transfers.  Nustep reciprocal movement training 5 min x 2 level 4>6. mincues for full ROM on the R as well as increased speed to ~ 45 steps per min to improve endurance aspect of exercise.  [facility] and all needs met.            Therapy Documentation Precautions:  Precautions Precautions: Fall Restrictions Weight Bearing Restrictions: No    Pain:   2/10 R leg, sore.    Therapy/Group: Individual Therapy  ALorie Phenix4/01/2019, 1:56 PM

## 2019-02-23 NOTE — Progress Notes (Signed)
Occupational Therapy Session Note  Patient Details  Name: Joel Rogers MRN: [id number] Date of Birth: February 04, 1951  Today's Date: 02/23/2019 OT Individual Time: 1350-1501 OT Individual Time Calculation (min): 71 min   Short Term Goals: Week 2:  OT Short Term Goal 1 (Week 2): Pt will perform slide board transfer with max A consistently.  OT Short Term Goal 2 (Week 2): Pt will sit EOB with min A for sitting balance for 5 minutes during self care tasks.  OT Short Term Goal 3 (Week 2): Pt will perform UB dressing with mod A .  Skilled Therapeutic Interventions/Progress Updates:    Pt greeted in w/c, requesting to shave his face and head. Pt completed these tasks while seated at sink with Mod A while focusing on unsupported sitting, Rt NMR, and activity tolerance. He was able to sit forward unsupported for 7 minutes at most. He used his Lt hand to shave and Rt to rinse razor in sink with verbal cuing. Pt required multiple reclined rest breaks due to fatigue. At end of session pt was positioned at bedside with half lap tray, safety belt fastened, and all needs within reach.   Therapy Documentation Precautions:  Precautions Precautions: Fall Restrictions Weight Bearing Restrictions: No Vital Signs: Therapy Vitals Temp: 98.5 F (36.9 C) Temp Source: Oral Pulse Rate: (!) 56 Resp: 18 BP: 111/78 Patient Position (if appropriate): Sitting Oxygen Therapy SpO2: 100 % O2 Device: Room Air Pain: Pt with back pain, however he declined for OT to ask for RN for pain medicine    ADL: ADL Upper Body Bathing: Maximal assistance Where Assessed-Upper Body Bathing: Bed level Lower Body Bathing: Dependent Where Assessed-Lower Body Bathing: Bed level Upper Body Dressing: Maximal assistance Where Assessed-Upper Body Dressing: Bed level Lower Body Dressing: Dependent Where Assessed-Lower Body Dressing: Bed level      Therapy/Group: Individual Therapy  [name] A Nino Amano 02/23/2019, 4:19 PM

## 2019-02-23 NOTE — Progress Notes (Signed)
Speech Language Pathology Daily Session Note  Patient Details  Name: Joel Rogers MRN: [id number] Date of Birth: 11/08/1951  Today's Date: 02/23/2019 SLP Individual Time: 0830-0930 SLP Individual Time Calculation (min): 60 min  Short Term Goals: Week 2: SLP Short Term Goal 1 (Week 2): Pt will utilize compensatory and external memory aids to recall, new daily information with Min A verbal cues. SLP Short Term Goal 2 (Week 2): Pt will complete semi-complex tasks with Mod A verbal cues for functional problem solving.  SLP Short Term Goal 3 (Week 2): Pt will demonstrate intellectual awareness by listing 3 physical and 3 cognitive deficits with Min A cues.  SLP Short Term Goal 4 (Week 2): Pt will demonstrate selective attention for ~ 30 minutes in moderately distracting environment with Min A cues.  SLP Short Term Goal 5 (Week 2): Pt will self-monitor and self-correct erros in 7 out of 10 opportunities with Mod A cues.   Skilled Therapeutic Interventions:  Skilled treatment session focused on cognition goals. SLP facilitated session by administering MOCA Blind in quiet environment. Pt obtained score of 16 out of 22 (n=>18). Pt's deficits in recall of tasks and working memory were most impacting factors to completion of tasks. SLP further facilitated session by providing Min A cues to achieve ~ 75% accuracy on recalling/listening for details within simple paragraphs. Education provided to pt on results of MOCA and areas of deficits. PT was left upright in bed, bed alarm on and all needs within reach. Continue per current plan of care.      Pain Pain Assessment Pain Scale: 0-10 Pain Score: 0-No pain  Therapy/Group: Individual Therapy  Breannah Kratt 02/23/2019, 9:22 AM

## 2019-02-24 ENCOUNTER — Inpatient Hospital Stay (HOSPITAL_COMMUNITY): Payer: Self-pay | Admitting: Physical Therapy

## 2019-02-24 ENCOUNTER — Inpatient Hospital Stay (HOSPITAL_COMMUNITY): Payer: Self-pay | Admitting: Occupational Therapy

## 2019-02-24 DIAGNOSIS — I10 Essential (primary) hypertension: Secondary | ICD-10-CM

## 2019-02-24 DIAGNOSIS — E876 Hypokalemia: Secondary | ICD-10-CM

## 2019-02-24 DIAGNOSIS — I5033 Acute on chronic diastolic (congestive) heart failure: Secondary | ICD-10-CM

## 2019-02-24 DIAGNOSIS — D62 Acute posthemorrhagic anemia: Secondary | ICD-10-CM

## 2019-02-24 DIAGNOSIS — D72829 Elevated white blood cell count, unspecified: Secondary | ICD-10-CM

## 2019-02-24 NOTE — Progress Notes (Signed)
BLE's with pitting edema. Large continent BM this AM. Joel Rogers

## 2019-02-24 NOTE — Progress Notes (Signed)
PHYSICAL MEDICINE & REHABILITATION PROGRESS NOTE   Subjective/Complaints: Patient seen laying in bed this morning.  He states he slept fairly overnight because he had 2 nightmares.  He has been medications for nightmares, however informed patient this is not available.  Encourage positive sleep hygiene.  ROS: Denies CP, SOB, N/V/D  Objective:   No results found. Recent Labs    02/23/19 0934  WBC 12.9*  HGB 7.9*  HCT 24.7*  PLT 206   Recent Labs    02/23/19 0934  NA 136  K 4.1  CL 108  CO2 23  GLUCOSE 112*  BUN 17  CREATININE 0.89  CALCIUM 8.1*    Intake/Output Summary (Last 24 hours) at 02/24/2019 1046 Last data filed at 02/24/2019 0940 Gross per 24 hour  Intake 753 ml  Output 1100 ml  Net -347 ml     Physical Exam: Vital Signs Blood pressure 97/83, pulse 61, temperature 98.4 F (36.9 C), temperature source Oral, resp. rate 18, weight 113 kg, SpO2 100 %. Constitutional: No distress . Vital signs reviewed. HENT: Normocephalic.  Atraumatic. Eyes: EOMI. No discharge. Cardiovascular: RRR. No JVD. Respiratory: CTA Bilaterally. Normal effort. GI: BS +. Non-distended. Musc: Right-sided edema with RUE with kinesiotaping Neurologic: Alert and oriented Motor: LUE/LLE: 5/5 proximal distal RUE: 3+/5 proximal distal LLE: 2/5 proximal to distal  Skin: No evidence of breakdown, no evidence of rash Psych: Normal mood.  Normal behavior.   Assessment/Plan: 1. Functional deficits secondary to cardioembolic bicerebral infarcts which require 3+ hours per day of interdisciplinary therapy in a comprehensive inpatient rehab setting.  Physiatrist is providing close team supervision and 24 hour management of active medical problems listed below.  Physiatrist and rehab team continue to assess barriers to discharge/monitor patient progress toward functional and medical goals  Care Tool:  Bathing    Body parts bathed by patient: Chest, Abdomen, Left upper leg, Face,  Right upper leg   Body parts bathed by helper: Front perineal area, Buttocks     Bathing assist Assist Level: 2 Helpers(bed level for perineal hygiene)     Upper Body Dressing/Undressing Upper body dressing Upper body dressing/undressing activity did not occur (including orthotics): Refused What is the patient wearing?: Pull over shirt    Upper body assist Assist Level: Moderate Assistance - Patient 50 - 74%    Lower Body Dressing/Undressing Lower body dressing      What is the patient wearing?: Pants     Lower body assist Assist for lower body dressing: Total Assistance - Patient < 25%     Toileting Toileting Toileting Activity did not occur Press photographer and hygiene only): Refused  Toileting assist Assist for toileting: 2 Helpers Assistive Device Comment: BSC(BSC)   Transfers Chair/bed transfer  Transfers assist  Chair/bed transfer activity did not occur: Safety/medical concerns  Chair/bed transfer assist level: Maximal Assistance - Patient 25 - 49%     Locomotion Ambulation   Ambulation assist   Ambulation activity did not occur: Safety/medical concerns  Assist level: Dependent - Patient 0%(maxi sky walking sling ) Assistive device: Maxi Sky Max distance: 23ft    Walk 10 feet activity   Assist  Walk 10 feet activity did not occur: Safety/medical concerns        Walk 50 feet activity   Assist Walk 50 feet with 2 turns activity did not occur: Safety/medical concerns         Walk 150 feet activity   Assist Walk 150 feet activity did not occur: Safety/medical  concerns         Walk 10 feet on uneven surface  activity   Assist Walk 10 feet on uneven surfaces activity did not occur: Safety/medical concerns         Wheelchair     Assist Will patient use wheelchair at discharge?: Yes Type of Wheelchair: Manual    Wheelchair assist level: Minimal Assistance - Patient > 75% Max wheelchair distance: 75    Wheelchair 50  feet with 2 turns activity    Assist    Wheelchair 50 feet with 2 turns activity did not occur: Safety/medical concerns(limited by fatigue)   Assist Level: Minimal Assistance - Patient > 75%   Wheelchair 150 feet activity     Assist Wheelchair 150 feet activity did not occur: Safety/medical concerns          Medical Problem List and Plan: 1.Persistent right side weakness with decreased mobilitysecondary to cardioembolic bi-cerebral infarction  Continue CIR 2. Antithrombotics: -DVT/anticoagulation:ELIQUIS -antiplatelet therapy:  3. Pain Management:Ultracet as needed 4. Mood:Provide emotional support  -appreciate neuropsych input re: coping with episodic stress/anxiety -antipsychotic agents: none 5. Neuropsych: This patientiscapable of making decisions on hisown behalf. 6. Skin/Wound Care:Routine skin checks 7. Fluids/Electrolytes/Nutrition:encourage PO 8. ABLA/ Upper GI bleed EGD 3. Follow-up GI services. Continue PPI -serial H/H's   -hgb 7.9 on 4/3  -1u PRBC Wednesday- 4/1    Labs ordered for Monday 10. Chronic atrial fibrillation with TAVR procedure September 2019 11. Non-STEMI. Thought secondary to thrombus status post cardiac catheterization. Continue eliquis 12. Acute on chronic diastolic congestive heart failure. Lasix 40 mg daily  Filed Weights   02/22/19 0433 02/23/19 0434 02/24/19 0447  Weight: 111 kg 111 kg 113 kg   ?  Reliability on 4/4 13. Hypertension. Norvasc 5 mg daily, Lopressor 50 mg twice a day. Monitor with increased mobility Vitals:   02/23/19 2054 02/24/19 0447  BP: 112/71 97/83  Pulse: (!) 59 61  Resp: 16 18  Temp: 98.5 F (36.9 C) 98.4 F (36.9 C)  SpO2: 100% 100%   Relatively controlled on 4/4 14. Hyperlipidemia. Lipitor 15. Persistent leukocytosis:  WBCs 12.9 on 4/3, improving  Afebrile  Labs ordered for Monday 16.  Hypokalemia  Potassium 4.1 on 4/3 after  supplementation  Labs ordered for Monday  LOS: 12 days A FACE TO FACE EVALUATION WAS PERFORMED   Karis Juba 02/24/2019, 10:46 AM

## 2019-02-24 NOTE — Progress Notes (Signed)
Physical Therapy Session Note  Patient Details  Name: Joel Rogers MRN: [id number] Date of Birth: 1951/09/07  Today's Date: 02/24/2019 PT Individual Time: 1400-1500 PT Individual Time Calculation (min): 60 min   Short Term Goals: Week 2:  PT Short Term Goal 1 (Week 2): Pt will perform bed mobility with Mod assist  PT Short Term Goal 2 (Week 2): Pt will perform bed<>WC transfer with mod assist and LRAD  PT Short Term Goal 3 (Week 2): Pt will ambulate 5 ft with Max assist +2 and LRAD   PT Short Term Goal 4 (Week 2): Pt will propell WC 19f with supervision assist   Skilled Therapeutic Interventions/Progress Updates:   Pt received semi-supine in bed and agreeable to PT. Supine>sit transfer with mod assist and moderate cues for sequencing and UE placement to improve ability to scoot to EOB PT applied ted hose to BLE. Sit>stand in stedy to don pants with max assist. [facility] to stand from stedy seat with moderate cues for erect posture and hip/trunk extensors.   [facility]  Seated BLE NMR LAQ,  X 15 with 3 sec hold Ankle PF x 20 Ankle DF. X 25 HS curl. X 12  Hip abduction x 15 with manual resistance Moderate cues from PT for full ROM, improved sustained activation, and decreased speed of eccentric movement. .    Sit<>stand x 2 in parallel bars with max assist overall and max multimodal cues for anterior weight shift, posture, proper UE placement. Standing tolerance and pregait training to perform lateral weight shift R and L x 1 minute and stepping the RLE forward/back. Pt unable to advance the LLE due to fear of R knee instability.   [facility] and all needs met.            Therapy Documentation Precautions:  Precautions Precautions: Fall Restrictions Weight Bearing Restrictions: No Vital Signs: Therapy Vitals Temp: 99 F (37.2 C) Temp Source: Oral Pulse Rate:  (!) 57 Resp: 16 BP: 105/61 Patient Position (if appropriate): Sitting Oxygen Therapy SpO2: 100 % O2 Device: Room Air Pain:   denies at rest  Therapy/Group: Individual Therapy  ALorie Phenix4/02/2019, 4:40 PM

## 2019-02-25 NOTE — Progress Notes (Signed)
[place] PHYSICAL MEDICINE & REHABILITATION PROGRESS NOTE   Subjective/Complaints: Patient seen laying in bed this morning.  He states he slept well overnight.  He denies complaints this morning.  He is looking forward to relative rest.  ROS: Denies CP, SOB, N/V/D  Objective:   No results found. Recent Labs    02/23/19 0934  WBC 12.9*  HGB 7.9*  HCT 24.7*  PLT 206   Recent Labs    02/23/19 0934  NA 136  K 4.1  CL 108  CO2 23  GLUCOSE 112*  BUN 17  CREATININE 0.89  CALCIUM 8.1*    Intake/Output Summary (Last 24 hours) at 02/25/2019 0856 Last data filed at 02/25/2019 0500 Gross per 24 hour  Intake 480 ml  Output 1300 ml  Net -820 ml     Physical Exam: Vital Signs Blood pressure 116/78, pulse (!) 56, temperature 98.2 F (36.8 C), resp. rate 16, weight 111.7 kg, SpO2 100 %. Constitutional: NAD.  Vital signs reviewed.Marland Kitchen HENT: Normocephalic.  Atraumatic. Eyes: EOMI.  No discharge. Cardiovascular: No JVD. Respiratory: Normal effort. GI: Non-distended. Musc: Right-sided edema with RUE with kinesiotaping Neurologic: Alert and oriented Motor:  RUE: 3+/5 proximal distal, stable RLE: 2/5 proximal to distal, stable Skin: No evidence of breakdown, no evidence of rash Psych: Normal mood.  Normal behavior.   Assessment/Plan: 1. Functional deficits secondary to cardioembolic bicerebral infarcts which require 3+ hours per day of interdisciplinary therapy in a comprehensive inpatient rehab setting.  Physiatrist is providing close team supervision and 24 hour management of active medical problems listed below.  Physiatrist and rehab team continue to assess barriers to discharge/monitor patient progress toward functional and medical goals  Care Tool:  Bathing    Body parts bathed by patient: Chest, Abdomen, Left upper leg, Face, Right upper leg   Body parts bathed by helper: Front perineal area, Buttocks     Bathing assist Assist Level: 2 Helpers(bed level for  perineal hygiene)     Upper Body Dressing/Undressing Upper body dressing Upper body dressing/undressing activity did not occur (including orthotics): Refused What is the patient wearing?: Pull over shirt    Upper body assist Assist Level: Moderate Assistance - Patient 50 - 74%    Lower Body Dressing/Undressing Lower body dressing      What is the patient wearing?: Pants     Lower body assist Assist for lower body dressing: Total Assistance - Patient < 25%     Toileting Toileting Toileting Activity did not occur Press photographer and hygiene only): Refused  Toileting assist Assist for toileting: 2 Helpers Assistive Device Comment: BSC(BSC)   Transfers Chair/bed transfer  Transfers assist  Chair/bed transfer activity did not occur: Safety/medical concerns  Chair/bed transfer assist level: Dependent - mechanical lift     Locomotion Ambulation   Ambulation assist   Ambulation activity did not occur: Safety/medical concerns  Assist level: Dependent - Patient 0%(maxi sky walking sling ) Assistive device: Maxi Sky Max distance: 39ft    Walk 10 feet activity   Assist  Walk 10 feet activity did not occur: Safety/medical concerns        Walk 50 feet activity   Assist Walk 50 feet with 2 turns activity did not occur: Safety/medical concerns         Walk 150 feet activity   Assist Walk 150 feet activity did not occur: Safety/medical concerns         Walk 10 feet on uneven surface  activity   Assist Walk 10  feet on uneven surfaces activity did not occur: Safety/medical concerns         Wheelchair     Assist Will patient use wheelchair at discharge?: Yes Type of Wheelchair: Manual    Wheelchair assist level: Minimal Assistance - Patient > 75% Max wheelchair distance: 75    Wheelchair 50 feet with 2 turns activity    Assist    Wheelchair 50 feet with 2 turns activity did not occur: Safety/medical concerns(limited by  fatigue)   Assist Level: Minimal Assistance - Patient > 75%   Wheelchair 150 feet activity     Assist Wheelchair 150 feet activity did not occur: Safety/medical concerns          Medical Problem List and Plan: 1.Persistent right side weakness with decreased mobilitysecondary to cardioembolic bi-cerebral infarction  Continue CIR 2. Antithrombotics: -DVT/anticoagulation:ELIQUIS -antiplatelet therapy:  3. Pain Management:Ultracet as needed 4. Mood:Provide emotional support  -appreciate neuropsych input re: coping with episodic stress/anxiety -antipsychotic agents: none 5. Neuropsych: This patientiscapable of making decisions on hisown behalf. 6. Skin/Wound Care:Routine skin checks 7. Fluids/Electrolytes/Nutrition:encourage PO 8. ABLA/ Upper GI bleed EGD 3. Follow-up GI services. Continue PPI -serial H/H's   -hgb 7.9 on 4/3  -1u PRBC Wednesday- 4/1    Labs ordered for tomorrow 10. Chronic atrial fibrillation with TAVR procedure September 2019 11. Non-STEMI. Thought secondary to thrombus status post cardiac catheterization. Continue eliquis 12. Acute on chronic diastolic congestive heart failure. Lasix 40 mg daily  Filed Weights   02/23/19 0434 02/24/19 0447 02/25/19 0347  Weight: 111 kg 113 kg 111.7 kg   Stable on 4/5 13. Hypertension. Norvasc 5 mg daily, Lopressor 50 mg twice a day. Monitor with increased mobility Vitals:   02/24/19 1952 02/25/19 0347  BP: 103/74 116/78  Pulse: 68 (!) 56  Resp: 16 16  Temp: 98.2 F (36.8 C) 98.2 F (36.8 C)  SpO2: 100% 100%   Relatively controlled on 4/5 14. Hyperlipidemia. Lipitor 15. Persistent leukocytosis:  WBCs 12.9 on 4/3, improving  Afebrile  Labs ordered for tomorrow 16.  Hypokalemia  Potassium 4.1 on 4/3 after supplementation  Labs ordered for tomorrow  LOS: 13 days A FACE TO FACE EVALUATION WAS PERFORMED  [place] Karis Juba 02/25/2019, 8:56 AM

## 2019-02-25 NOTE — Progress Notes (Signed)
Good night. BLE's with 2 plus pitting edema, right>left. Old scab/wound to right shin. Refused to have heels or legs elevated. Talked with patient about pressure injuries and need to elevate heels, continues to decline. Joel Rogers A

## 2019-02-26 ENCOUNTER — Ambulatory Visit (HOSPITAL_COMMUNITY): Payer: Medicare Other | Attending: Cardiovascular Disease

## 2019-02-26 ENCOUNTER — Inpatient Hospital Stay (HOSPITAL_COMMUNITY): Payer: Self-pay | Admitting: Occupational Therapy

## 2019-02-26 ENCOUNTER — Inpatient Hospital Stay (HOSPITAL_COMMUNITY): Payer: Self-pay | Admitting: Speech Pathology

## 2019-02-26 ENCOUNTER — Inpatient Hospital Stay (HOSPITAL_COMMUNITY): Payer: Self-pay | Admitting: *Deleted

## 2019-02-26 NOTE — Progress Notes (Signed)
[place] PHYSICAL MEDICINE & REHABILITATION PROGRESS NOTE   Subjective/Complaints: Pt states "he doesn't want to talk about his GI bleeding today" States he's had an ulcer and was taking Eliquis prior to his hospitalization, states he did not require OP transfusions Wants a break from blood draws  ROS: Denies CP, SOB, N/V/D  Objective:   No results found. Recent Labs    02/23/19 0934  WBC 12.9*  HGB 7.9*  HCT 24.7*  PLT 206   Recent Labs    02/23/19 0934  NA 136  K 4.1  CL 108  CO2 23  GLUCOSE 112*  BUN 17  CREATININE 0.89  CALCIUM 8.1*    Intake/Output Summary (Last 24 hours) at 02/26/2019 0929 Last data filed at 02/26/2019 0700 Gross per 24 hour  Intake 960 ml  Output 1401 ml  Net -441 ml     Physical Exam: Vital Signs Blood pressure 109/62, pulse 77, temperature 97.9 F (36.6 C), resp. rate 18, weight 111.7 kg, SpO2 100 %. Constitutional: NAD.  Vital signs reviewed.Marland Kitchen HENT: Normocephalic.  Atraumatic. Eyes: EOMI.  No discharge. Cardiovascular: No JVD. Respiratory: Normal effort. GI: Non-distended. Musc: Right-sided edema with RUE with kinesiotaping Neurologic: Alert and oriented Motor:  RUE: 3+/5 proximal distal, stable RLE: 2/5 proximal to distal, stable, LLE 3- Skin: No evidence of breakdown, no evidence of rash Psych: Normal mood.  Normal behavior.   Assessment/Plan: 1. Functional deficits secondary to cardioembolic bicerebral infarcts which require 3+ hours per day of interdisciplinary therapy in a comprehensive inpatient rehab setting.  Physiatrist is providing close team supervision and 24 hour management of active medical problems listed below.  Physiatrist and rehab team continue to assess barriers to discharge/monitor patient progress toward functional and medical goals  Care Tool:  Bathing    Body parts bathed by patient: Chest, Abdomen, Left upper leg, Face, Right upper leg   Body parts bathed by helper: Front perineal area,  Buttocks     Bathing assist Assist Level: 2 Helpers(bed level for perineal hygiene)     Upper Body Dressing/Undressing Upper body dressing Upper body dressing/undressing activity did not occur (including orthotics): Refused What is the patient wearing?: Pull over shirt    Upper body assist Assist Level: Moderate Assistance - Patient 50 - 74%    Lower Body Dressing/Undressing Lower body dressing      What is the patient wearing?: Pants     Lower body assist Assist for lower body dressing: Total Assistance - Patient < 25%     Toileting Toileting Toileting Activity did not occur Press photographer and hygiene only): Refused  Toileting assist Assist for toileting: 2 Helpers Assistive Device Comment: BSC(BSC)   Transfers Chair/bed transfer  Transfers assist  Chair/bed transfer activity did not occur: Safety/medical concerns  Chair/bed transfer assist level: Dependent - mechanical lift     Locomotion Ambulation   Ambulation assist   Ambulation activity did not occur: Safety/medical concerns  Assist level: Dependent - Patient 0%(maxi sky walking sling ) Assistive device: Maxi Sky Max distance: 59ft    Walk 10 feet activity   Assist  Walk 10 feet activity did not occur: Safety/medical concerns        Walk 50 feet activity   Assist Walk 50 feet with 2 turns activity did not occur: Safety/medical concerns         Walk 150 feet activity   Assist Walk 150 feet activity did not occur: Safety/medical concerns         Walk 10  feet on uneven surface  activity   Assist Walk 10 feet on uneven surfaces activity did not occur: Safety/medical concerns         Wheelchair     Assist Will patient use wheelchair at discharge?: Yes Type of Wheelchair: Manual    Wheelchair assist level: Minimal Assistance - Patient > 75% Max wheelchair distance: 75    Wheelchair 50 feet with 2 turns activity    Assist    Wheelchair 50 feet with 2 turns  activity did not occur: Safety/medical concerns(limited by fatigue)   Assist Level: Minimal Assistance - Patient > 75%   Wheelchair 150 feet activity     Assist Wheelchair 150 feet activity did not occur: Safety/medical concerns          Medical Problem List and Plan: 1.Persistent right side weakness with decreased mobilitysecondary to cardioembolic bi-cerebral infarction  Continue CIR 2. Antithrombotics: -DVT/anticoagulation:ELIQUIS -antiplatelet therapy:  3. Pain Management:Ultracet as needed 4. Mood:Provide emotional support  -appreciate neuropsych input re: coping with episodic stress/anxiety -antipsychotic agents: none 5. Neuropsych: This patientiscapable of making decisions on hisown behalf. 6. Skin/Wound Care:Routine skin checks 7. Fluids/Electrolytes/Nutrition:encourage PO 8. ABLA/ Upper GI bleed EGD 3. Follow-up GI services. Continue PPI -serial H/H's   -hgb 7.9 on 4/3  -1u PRBC Wednesday- 4/1    Labs refused will reorder later iun wk 10. Chronic atrial fibrillation with TAVR procedure September 2019 11. Non-STEMI. Thought secondary to thrombus status post cardiac catheterization. Continue eliquis 12. Acute on chronic diastolic congestive heart failure. Lasix 40 mg daily  Filed Weights   02/23/19 0434 02/24/19 0447 02/25/19 0347  Weight: 111 kg 113 kg 111.7 kg   Stable on 4/6 13. Hypertension. Norvasc 5 mg daily, Lopressor 50 mg twice a day. Monitor with increased mobility Vitals:   02/26/19 0426 02/26/19 0759  BP: 105/72 109/62  Pulse: 73 77  Resp: 18   Temp: 97.9 F (36.6 C)   SpO2: 100% 100%   Relatively controlled on 4/6 14. Hyperlipidemia. Lipitor 15. Persistent leukocytosis:  WBCs 12.9 on 4/3, improving  Afebrile   16.  Hypokalemia  Potassium 4.1 on 4/3 after supplementation  Labs refused will reorder later in week  LOS: 14 days A FACE TO FACE EVALUATION WAS PERFORMED  Erick Colace 02/26/2019, 9:29 AM

## 2019-02-26 NOTE — Progress Notes (Signed)
Physical Therapy Session Note  Patient Details  Name: Joel Rogers MRN: [id number] Date of Birth: 14-Jun-1951  Today's Date: 02/26/2019 PT Individual Time: 1000-1100 PT Individual Time Calculation (min): 60 min   Short Term Goals: Week 2:  PT Short Term Goal 1 (Week 2): Pt will perform bed mobility with Mod assist  PT Short Term Goal 2 (Week 2): Pt will perform bed<>WC transfer with mod assist and LRAD  PT Short Term Goal 3 (Week 2): Pt will ambulate 5 ft with Max assist +2 and LRAD   PT Short Term Goal 4 (Week 2): Pt will propell WC 112f with supervision assist   Skilled Therapeutic Interventions/Progress Updates: Pt presented in bed agreeable to therapy. PTA donned compression socks total A, PTA threaded pants total A, pt performed rolling L/R with min to modA with PTA pulling up pants over hips. Pt performed supine to sit modA with HOB elevated and increased time. Pt was able to position BLE onto EOB and used LUE to facilitate trunk into upright position with modA from PTA. Pt required x 2 attempts to perform STS from bed with Stedy. On second attempt PTA provided tactile cues for increased rocking to build momentum. Pt was able to achieve full standing on second attempt. Pt transferred to w/c and transported to ortho gym. Pt participated in SB transfer to/from mat. Pt was able to perform transfer to L with slight down hill modA x1 with PTA providing facilitation of increasing anterior lean. Pt returned to w/c with mat "level" with pt requiring modA x 2 and increased cues for sequencing. Pt was able to complete transfer without assist for last 25% into w/c. [place] [facility] and was able to propel from nursing station to room with supervision and min verbal cues for correction due to drifting to R. Pt remained in w/c once returned to room and left with call bell within reach and current needs met.      Therapy Documentation Precautions:  Precautions Precautions:  Fall Restrictions Weight Bearing Restrictions: No General:   Vital Signs:   Pain: Pain Assessment Pain Scale: 0-10 Pain Score: 9     Therapy/Group: Individual Therapy  Conroy Goracke  Mazy Culton, PTA  02/26/2019, 3:44 PM

## 2019-02-26 NOTE — Progress Notes (Signed)
Occupational Therapy Session Note  Patient Details  Name: Joel Rogers MRN: [id number] Date of Birth: 10/10/1951  Today's Date: 02/26/2019 OT Individual Time: 1230-1245 and 1400-1410  OT Individual Time Calculation (min): 15 min  and 10 min  Today's Date: 02/26/2019 OT Missed Time: 30 Minutes and 65 in PM session Missed Time Reason: Patient unwilling/refused to participate without medical reason   Short Term Goals: Week 2:  OT Short Term Goal 1 (Week 2): Pt will perform slide board transfer with max A consistently.  OT Short Term Goal 2 (Week 2): Pt will sit EOB with min A for sitting balance for 5 minutes during self care tasks.  OT Short Term Goal 3 (Week 2): Pt will perform UB dressing with mod A .  Skilled Therapeutic Interventions/Progress Updates:    Session 1:Upon entering the room, pt sitting in wheelchair and eating lunch. Pt declined OT intervention secondary to starting meal. Pt verbalized, " I hurt real bad." Pt refusing pain medication. OT encouraged pt to take medication in order to participate in OT intervention later. Pt continued to decline. OT requesting K pad for pain management instead. OT discussed several options for next therapy session scheduled at 1400 and pt declined all. OT will return at that time to encourage participation. Pt remained in wheelchair with call bell and all needed items. RN notified.   Session 2: Pt seated in wheelchair and sleeping upon therapist entering the room. Pt verbalized, " I just can't do it. This medicine makes me really tired." OT encourages pt to participate but he continues to refuse. OT educated pt on importance of OT intervention to achieve goals for discharge. Pt continues to decline. Pt remaining in wheelchair with call bell and all needed items within reach upon exiting the room. 65 missed minutes of skilled OT intervention.   Therapy Documentation Precautions:  Precautions Precautions: Fall Restrictions Weight Bearing  Restrictions: No General: General OT Amount of Missed Time: 30 Minutes ADL: ADL Upper Body Bathing: Maximal assistance Where Assessed-Upper Body Bathing: Bed level Lower Body Bathing: Dependent Where Assessed-Lower Body Bathing: Bed level Upper Body Dressing: Maximal assistance Where Assessed-Upper Body Dressing: Bed level Lower Body Dressing: Dependent Where Assessed-Lower Body Dressing: Bed level   Therapy/Group: Individual Therapy  [name] 02/26/2019, 12:56 PM

## 2019-02-26 NOTE — Progress Notes (Signed)
Speech Language Pathology Daily Session Note  Patient Details  Name: Joel Rogers MRN: [id number] Date of Birth: Jun 16, 1951  Today's Date: 02/26/2019 SLP Individual Time: 0881-1031 SLP Individual Time Calculation (min): 60 min  Short Term Goals: Week 2: SLP Short Term Goal 1 (Week 2): Pt will utilize compensatory and external memory aids to recall, new daily information with Min A verbal cues. SLP Short Term Goal 2 (Week 2): Pt will complete semi-complex tasks with Mod A verbal cues for functional problem solving.  SLP Short Term Goal 3 (Week 2): Pt will demonstrate intellectual awareness by listing 3 physical and 3 cognitive deficits with Min A cues.  SLP Short Term Goal 4 (Week 2): Pt will demonstrate selective attention for ~ 30 minutes in moderately distracting environment with Min A cues.  SLP Short Term Goal 5 (Week 2): Pt will self-monitor and self-correct erros in 7 out of 10 opportunities with Mod A cues.   Skilled Therapeutic Interventions:    Skilled treatment session focused on cognition goals. SLP facilitated session by providing previously taught card game for comparison of ability. Pt able to recall rules of game with supervision cues and able to problem solve game with Min A cues faded to supervision cues. Additionally, pt able to utilize calendar on wall and find correct date (without previous dates being hashed off) to recall correct date. Pt with evidence of progress this session. Pt left upright in bed, bed alarm on and all needs within reach. Continue per current plan of care.   Pain    Therapy/Group: Individual Therapy  [name] 02/26/2019, 12:46 PM

## 2019-02-26 NOTE — Progress Notes (Signed)
Told Dr. Wynn Banker that pt is refusing blood draw. Pt educated about how we need to monitor levels and continues to refuse.

## 2019-02-27 ENCOUNTER — Inpatient Hospital Stay (HOSPITAL_COMMUNITY): Payer: Self-pay | Admitting: Occupational Therapy

## 2019-02-27 ENCOUNTER — Inpatient Hospital Stay (HOSPITAL_COMMUNITY): Payer: Self-pay | Admitting: Physical Therapy

## 2019-02-27 ENCOUNTER — Inpatient Hospital Stay (HOSPITAL_COMMUNITY): Payer: Self-pay

## 2019-02-27 NOTE — Progress Notes (Signed)
Physical Therapy Session Note  Patient Details  Name: Joel Rogers MRN: [id number] Date of Birth: [birth year]  Today's Date: 02/27/2019 PT Individual Time: 1330-1400 PT Individual Time Calculation (min): 30 min   Short Term Goals: Week 2:  PT Short Term Goal 1 (Week 2): Pt will perform bed mobility with Mod assist  PT Short Term Goal 1 - Progress (Week 2): Met PT Short Term Goal 2 (Week 2): Pt will perform bed<>WC transfer with mod assist and LRAD  PT Short Term Goal 2 - Progress (Week 2): Partly met PT Short Term Goal 3 (Week 2): Pt will ambulate 5 ft with Max assist +2 and LRAD   PT Short Term Goal 3 - Progress (Week 2): Not met PT Short Term Goal 4 (Week 2): Pt will propell WC 153f with supervision assist  PT Short Term Goal 4 - Progress (Week 2): Not met  Skilled Therapeutic Interventions/Progress Updates:    pt in w/c finishing lunch. Agreeable to participate with PT after being given choice of what to work on.  Pt requests to work on "standing up".  Pt performs sit <> stand x 3 in parallel bars with mod/max A, rocking to build momentum to stand.  Pt able to stand 30 seconds each attempt with continued forward flexed posture at trunk and hips, unable to come to full trunk extension.  Pt pleased with progress, refuses final 30 minutes of session due to "I've done all I can for now".  Pt left in room with all needs at hand.  Therapy Documentation Precautions:  Precautions Precautions: Fall Restrictions Weight Bearing Restrictions: No General: PT Amount of Missed Time (min): 30 Minutes PT Missed Treatment Reason: Patient fatigue Pain: Pain Assessment Pain Score: 0-No pain   Therapy/Group: Individual Therapy  Herbert Aguinaldo 02/27/2019, 2:14 PM

## 2019-02-27 NOTE — Progress Notes (Signed)
Physical Therapy Session Note  Patient Details  Name: Joel Rogers MRN: [id number] Date of Birth: 11-02-1951  Today's Date: 02/27/2019 PT Individual Time: 1207-1235 PT Individual Time Calculation (min): 28 min   Short Term Goals: Week 2:  PT Short Term Goal 1 (Week 2): Pt will perform bed mobility with Mod assist  PT Short Term Goal 1 - Progress (Week 2): Met PT Short Term Goal 2 (Week 2): Pt will perform bed<>WC transfer with mod assist and LRAD  PT Short Term Goal 2 - Progress (Week 2): Partly met PT Short Term Goal 3 (Week 2): Pt will ambulate 5 ft with Max assist +2 and LRAD   PT Short Term Goal 3 - Progress (Week 2): Not met PT Short Term Goal 4 (Week 2): Pt will propell WC 16f with supervision assist  PT Short Term Goal 4 - Progress (Week 2): Not met  Skilled Therapeutic Interventions/Progress Updates: Pt presented in w/c agreeable to therapy. Pt denies pain at rest but c/o some chest soreness with activity. Session focused on w/c mobility and endurance. Pt propelled w/c [id number] 2 and including x 1 turn. Pt required min verbal cues for increasing use of RUE to maintain straight trajectory. Pt required minA for sequencing for turning to L however after cues progressed to CGA. Pt required intermittent brief rest breaks due to pain and fatigue. Pt transported back to room at end of session and left with call bell within reach and needs met.      Therapy Documentation Precautions:  Precautions Precautions: Fall Restrictions Weight Bearing Restrictions: No General:   Vital Signs:   Pain: Pain Assessment Pain Scale: 0-10 Pain Score: 0-No pain Faces Pain Scale: No hurt    Therapy/Group: Individual Therapy  Kriston Pasquarello  Kamron Vanwyhe, PTA  02/27/2019, 12:46 PM

## 2019-02-27 NOTE — Progress Notes (Signed)
[facility] PHYSICAL MEDICINE & REHABILITATION PROGRESS NOTE   Subjective/Complaints: Patient indicates that he slept well last night he does not have any current complaints other than his usual "aches and pains Patient states that he is trying to move his right upper and right lower limb more frequently.  He does have his compression hose knee-high from home  ROS: Denies CP, SOB, N/V/D  Objective:   No results found. No results for input(s): WBC, HGB, HCT, PLT in the last 72 hours. No results for input(s): NA, K, CL, CO2, GLUCOSE, BUN, CREATININE, CALCIUM in the last 72 hours.  Intake/Output Summary (Last 24 hours) at 02/27/2019 1009 Last data filed at 02/27/2019 0700 Gross per 24 hour  Intake 720 ml  Output 1025 ml  Net -305 ml     Physical Exam: Vital Signs Blood pressure 113/76, pulse 63, temperature 98.1 F (36.7 C), resp. rate 18, weight 111.3 kg, SpO2 100 %. Constitutional: NAD.  Vital signs reviewed.Marland Kitchen. HENT: Normocephalic.  Atraumatic. Eyes: EOMI.  No discharge. Cardiovascular: No JVD. Respiratory: Normal effort. GI: Non-distended. Musc: Right-sided edema with RUE with kinesiotaping Neurologic: Alert and oriented Motor:  RUE: 3+/5 proximal distal, stable RLE: 2/5 proximal to distal, stable, LLE 3- Skin: No evidence of breakdown, no evidence of rash Psych: Normal mood.  Normal behavior.   Assessment/Plan: 1. Functional deficits secondary to cardioembolic bicerebral infarcts which require 3+ hours per day of interdisciplinary therapy in a comprehensive inpatient rehab setting.  Physiatrist is providing close team supervision and 24 hour management of active medical problems listed below.  Physiatrist and rehab team continue to assess barriers to discharge/monitor patient progress toward functional and medical goals  Care Tool:  Bathing    Body parts bathed by patient: Chest, Abdomen, Left upper leg, Face, Right upper leg   Body parts bathed by helper: Front  perineal area, Buttocks     Bathing assist Assist Level: 2 Helpers(bed level for perineal hygiene)     Upper Body Dressing/Undressing Upper body dressing Upper body dressing/undressing activity did not occur (including orthotics): Refused What is the patient wearing?: Pull over shirt    Upper body assist Assist Level: Moderate Assistance - Patient 50 - 74%    Lower Body Dressing/Undressing Lower body dressing      What is the patient wearing?: Pants     Lower body assist Assist for lower body dressing: Total Assistance - Patient < 25%     Toileting Toileting Toileting Activity did not occur Press photographer(Clothing management and hygiene only): Refused  Toileting assist Assist for toileting: 2 Helpers Assistive Device Comment: BSC(BSC)   Transfers Chair/bed transfer  Transfers assist  Chair/bed transfer activity did not occur: Safety/medical concerns  Chair/bed transfer assist level: Moderate Assistance - Patient 50 - 74%     Locomotion Ambulation   Ambulation assist   Ambulation activity did not occur: Safety/medical concerns  Assist level: Dependent - Patient 0%(maxi sky walking sling ) Assistive device: Maxi Sky Max distance: 633ft    Walk 10 feet activity   Assist  Walk 10 feet activity did not occur: Safety/medical concerns        Walk 50 feet activity   Assist Walk 50 feet with 2 turns activity did not occur: Safety/medical concerns         Walk 150 feet activity   Assist Walk 150 feet activity did not occur: Safety/medical concerns         Walk 10 feet on uneven surface  activity   Assist Walk  10 feet on uneven surfaces activity did not occur: Safety/medical concerns         Wheelchair     Assist Will patient use wheelchair at discharge?: Yes Type of Wheelchair: Manual    Wheelchair assist level: Minimal Assistance - Patient > 75% Max wheelchair distance: 75    Wheelchair 50 feet with 2 turns activity    Assist     Wheelchair 50 feet with 2 turns activity did not occur: Safety/medical concerns(limited by fatigue)   Assist Level: Minimal Assistance - Patient > 75%   Wheelchair 150 feet activity     Assist Wheelchair 150 feet activity did not occur: Safety/medical concerns          Medical Problem List and Plan: 1.Persistent right side weakness with decreased mobilitysecondary to cardioembolic bi-cerebral infarction  Continue CIR, PT, OT, speech, team conference in a.m. 2. Antithrombotics: -DVT/anticoagulation:ELIQUIS -antiplatelet therapy:  3. Pain Management:Ultracet as needed 4. Mood:Provide emotional support  -appreciate neuropsych input re: coping with episodic stress/anxiety -antipsychotic agents: none 5. Neuropsych: This patientiscapable of making decisions on hisown behalf. 6. Skin/Wound Care:Routine skin checks 7. Fluids/Electrolytes/Nutrition:encourage PO 8. ABLA/ Upper GI bleed EGD 3. Follow-up GI services. Continue PPI -serial H/H's   -hgb 7.9 on 4/3  -1u PRBC Wednesday- 4/1    Labs refused will reorder later this wk 10. Chronic atrial fibrillation with TAVR procedure September 2019 11. Non-STEMI. Thought secondary to thrombus status post cardiac catheterization. Continue eliquis 12. Acute on chronic diastolic congestive heart failure. Lasix 40 mg daily  Filed Weights   02/24/19 0447 02/25/19 0347 02/27/19 0705  Weight: 113 kg 111.7 kg 111.3 kg   Stable on 4/7 13. Hypertension. Norvasc 5 mg daily, Lopressor 50 mg twice a day. Monitor with increased mobility Vitals:   02/26/19 1953 02/27/19 0306  BP: 107/65 113/76  Pulse: 71 63  Resp: 18 18  Temp: 98 F (36.7 C) 98.1 F (36.7 C)  SpO2: 100% 100%   controlled on 4/7 14. Hyperlipidemia. Lipitor 15. Persistent leukocytosis:  WBCs 12.9 on 4/3, improving  Afebrile   16.  Hypokalemia  Potassium 4.1 on 4/3 after supplementation  Labs refused will reorder later  in week  LOS: 15 days A FACE TO FACE EVALUATION WAS PERFORMED  Erick Colace 02/27/2019, 10:09 AM

## 2019-02-27 NOTE — Progress Notes (Signed)
Physical Therapy Weekly Progress Note  Patient Details  Name: Joel Rogers MRN: [id number] Date of Birth: 06/10/1951  Beginning of progress report period: February 21, 2019 End of progress report period: March 12, 2019  Today's Date: 02/27/2019     Patient has met 1 of 4 short term goals.  Pt is currently making slower then anticipated gains. Pt continues to fluctuate on levels of dependence pending fatigue levels. Pt is limited at time by chest and RLE pain while limits pt's level of participation.    Patient continues to demonstrate the following deficits muscle weakness and muscle paralysis, decreased cardiorespiratoy endurance and impaired timing and sequencing, abnormal tone, unbalanced muscle activation, decreased coordination and decreased motor planning and therefore will continue to benefit from skilled PT intervention to increase functional independence with mobility.  Patient not progressing toward long term goals.  See goal revision..  Continue plan of care.  PT Short Term Goals Week 2:  PT Short Term Goal 1 (Week 2): Pt will perform bed mobility with Mod assist  PT Short Term Goal 1 - Progress (Week 2): Met PT Short Term Goal 2 (Week 2): Pt will perform bed<>WC transfer with mod assist and LRAD  PT Short Term Goal 2 - Progress (Week 2): Partly met PT Short Term Goal 3 (Week 2): Pt will ambulate 5 ft with Max assist +2 and LRAD   PT Short Term Goal 3 - Progress (Week 2): Not met PT Short Term Goal 4 (Week 2): Pt will propell WC 173f with supervision assist  PT Short Term Goal 4 - Progress (Week 2): Not met Week 3:  PT Short Term Goal 1 (Week 3): Pt will perform bed<>WC transfer with min A consistantly  PT Short Term Goal 2 (Week 3): Pt will propell WC 1258fwith supevision assist  PT Short Term Goal 3 (Week 3): Pt will ambulate [id number] LRAD and max A + 2   Ambulation/gait training;Community reintegration;DME/adaptive equipment instruction;Neuromuscular  re-education;Psychosocial support;Stair training;UE/LE Strength taining/ROM;Wheelchair propulsion/positioning;Balance/vestibular training;Discharge planning;Functional electrical stimulation;Pain management;Skin care/wound management;Therapeutic Activities;UE/LE Coordination activities;Cognitive remediation/compensation;Disease management/prevention;Functional mobility training;Patient/family education;Splinting/orthotics;Therapeutic Exercise   Therapy Documentation Precautions:  Precautions Precautions: Fall Restrictions Weight Bearing Restrictions: No  Therapy/Group: Individual Therapy  [name] 02/27/2019, 8:30 AM

## 2019-02-27 NOTE — Progress Notes (Signed)
Occupational Therapy Session Note  Patient Details  Name: Joel Rogers MRN: [id number] Date of Birth: [birth year]  Today's Date: 02/27/2019 OT Individual Time: 2595-6387 and 1530-1600 OT Individual Time Calculation (min): 58 min and 30 min    Short Term Goals: Week 2:  OT Short Term Goal 1 (Week 2): Pt will perform slide board transfer with max A consistently.  OT Short Term Goal 2 (Week 2): Pt will sit EOB with min A for sitting balance for 5 minutes during self care tasks.  OT Short Term Goal 3 (Week 2): Pt will perform UB dressing with mod A .  Skilled Therapeutic Interventions/Progress Updates:   Session 1: Upon entering the room, pt supine in bed with no c/o pain and agreeable to OT intervention. Supine >sit with mod A for R LE and trunk elevation to EOB. Pt engaged in lateral leans and returning to midline x 3 each side while therapist pulling pants over B hips. While seated on EOB with supervision for static sitting balance OT provided total A to don B ted hose and shoes. Slide board transfer to the R from bed to wheelchair with mod assist initially to get it going and then pt completed with min A and OT steadying board. Pt taking over 15 minutes to complete slide board transfer. OT demonstrated and educated pt on importance for anterior weight shift for energy conservation to make it easier to perform transfer. Pt seated in wheelchair at sink with use of  B UE for functional tasks of brushing teeth. Pt donning pull over shirt with mod A to thread onto L UE secondary to IV placement and pull down over trunk. Pt remained seated in wheelchair with call bell and all needed items within reach upon exiting the room.   Session 2: Upon entering the room, pt seated in wheelchair and requesting to slideboard back into bed. OT set up wheelchair and pt verbalized step of transfer. Pt's shorts getting caught on board and pt needing max A for safety to slide board back to bed. Pt seated on EOB while  therapist assisted with removing socks and shoes. Pt needing mod A for sit >supine for B LEs. Pt rolling L <> R with mod A for repositioning. Pt remained in bed with call bell and all needed items within reach upon exiting the room.   Therapy Documentation Precautions:  Precautions Precautions: Fall Restrictions Weight Bearing Restrictions: No   Pain: Pain Assessment Pain Scale: 0-10 Pain Score: 0-No pain Faces Pain Scale: No hurt ADL: ADL Upper Body Bathing: Maximal assistance Where Assessed-Upper Body Bathing: Bed level Lower Body Bathing: Dependent Where Assessed-Lower Body Bathing: Bed level Upper Body Dressing: Maximal assistance Where Assessed-Upper Body Dressing: Bed level Lower Body Dressing: Dependent Where Assessed-Lower Body Dressing: Bed level   Therapy/Group: Individual Therapy  [name] 02/27/2019, 9:06 AM

## 2019-02-27 NOTE — Plan of Care (Signed)
  Problem: Consults Goal: RH STROKE PATIENT EDUCATION Description See Patient Education module for education specifics  Outcome: Progressing   Problem: RH BOWEL ELIMINATION Goal: RH STG MANAGE BOWEL WITH ASSISTANCE Description STG Manage Bowel with max Assistance.  Outcome: Progressing   Problem: RH BLADDER ELIMINATION Goal: RH STG MANAGE BLADDER WITH ASSISTANCE Description STG Manage Bladder With max Assistance  Outcome: Progressing   Problem: RH SKIN INTEGRITY Goal: RH STG SKIN FREE OF INFECTION/BREAKDOWN Description Maintain with min assist  Outcome: Progressing Goal: RH STG MAINTAIN SKIN INTEGRITY WITH ASSISTANCE Description STG Maintain Skin Integrity With min Assistance.  Outcome: Progressing   Problem: RH SAFETY Goal: RH STG ADHERE TO SAFETY PRECAUTIONS W/ASSISTANCE/DEVICE Description STG Adhere to Safety Precautions With cues/reminders Assistance/Device.  Outcome: Progressing   Problem: RH PAIN MANAGEMENT Goal: RH STG PAIN MANAGED AT OR BELOW PT'S PAIN GOAL Description At or below level 4  Outcome: Progressing   Problem: RH KNOWLEDGE DEFICIT Goal: RH STG INCREASE KNOWLEGDE OF HYPERLIPIDEMIA Description Pt will be able to manage HLD with medications and dietary restrictions with min assist  Outcome: Progressing Goal: RH STG INCREASE KNOWLEDGE OF STROKE PROPHYLAXIS Description Pt will be able to explain management of secondary stroke with diet and medications using handouts and resources with min assist  Outcome: Progressing Goal: RH STG INCREASE KNOWLEDGE OF HYPERTENSION Description Pt will be able to manage HTN with medications and dietary restrictions with min assist   Outcome: Progressing   

## 2019-02-27 NOTE — Progress Notes (Signed)
Speech Language Pathology Daily Session Note  Patient Details  Name: Joel Rogers MRN: [id number] Date of Birth: [birth year]  Today's Date: 02/27/2019 SLP Individual Time: 7473-4037 SLP Individual Time Calculation (min): 60 min  Short Term Goals: Week 2: SLP Short Term Goal 1 (Week 2): Pt will utilize compensatory and external memory aids to recall, new daily information with Min A verbal cues. SLP Short Term Goal 2 (Week 2): Pt will complete semi-complex tasks with Mod A verbal cues for functional problem solving.  SLP Short Term Goal 3 (Week 2): Pt will demonstrate intellectual awareness by listing 3 physical and 3 cognitive deficits with Min A cues.  SLP Short Term Goal 4 (Week 2): Pt will demonstrate selective attention for ~ 30 minutes in moderately distracting environment with Min A cues.  SLP Short Term Goal 5 (Week 2): Pt will self-monitor and self-correct erros in 7 out of 10 opportunities with Mod A cues.   Skilled Therapeutic Interventions: Skilled ST services focused on cognitive skills. SLP facilitated recall skills focusing on visual and auditory memory strategies. SLP instructed pt in visual memory strategies visualization and verbalizing memory strategies, summarizing, and reciting, with an overall focus on intense concentration. SLP facilitated visual memory skills utilzing recall of 5 piece shape patterns, following 1 minute delay in a distracting environment, pt demonstrated ability to recall/return demonstration of 1 out 4 and 3 out 4 with min A verbal cues for error corrections. SLP facilitated auditory memory skills utilizing recalling 5 facts from simple paragraphs with 30 second delay pt required min A verbal cues in quiet environment and mod A verbal cues in a distracting environment. Pt was left in room with call bell within reach and chair alarm set. ST recommends to continue skilled ST services.      Pain Pain Assessment Pain Scale: 0-10 Pain Score: 0-No pain Faces  Pain Scale: No hurt  Therapy/Group: Individual Therapy  Scarlett Portlock  [facility] 02/27/2019, 12:53 PM

## 2019-02-28 ENCOUNTER — Inpatient Hospital Stay (HOSPITAL_COMMUNITY): Payer: Self-pay | Admitting: Occupational Therapy

## 2019-02-28 ENCOUNTER — Inpatient Hospital Stay (HOSPITAL_COMMUNITY): Payer: Self-pay

## 2019-02-28 ENCOUNTER — Inpatient Hospital Stay (HOSPITAL_COMMUNITY): Payer: Self-pay | Admitting: Physical Therapy

## 2019-02-28 NOTE — Progress Notes (Signed)
[place] PHYSICAL MEDICINE & REHABILITATION PROGRESS NOTE   Subjective/Complaints: Patient complaining of right knee pain during therapy.  Does not have this during rest.  ROS: Denies CP, SOB, N/V/D  Objective:   No results found. No results for input(s): WBC, HGB, HCT, PLT in the last 72 hours. No results for input(s): NA, K, CL, CO2, GLUCOSE, BUN, CREATININE, CALCIUM in the last 72 hours.  Intake/Output Summary (Last 24 hours) at 02/28/2019 1141 Last data filed at 02/28/2019 1100 Gross per 24 hour  Intake 240 ml  Output 950 ml  Net -710 ml     Physical Exam: Vital Signs Blood pressure 105/60, pulse 60, temperature 98.3 F (36.8 C), temperature source Oral, resp. rate 18, weight 111.2 kg, SpO2 100 %. Constitutional: NAD.  Vital signs reviewed.Marland Kitchen HENT: Normocephalic.  Atraumatic. Eyes: EOMI.  No discharge. Cardiovascular: No JVD. Respiratory: Normal effort. GI: Non-distended. Musc: Right-sided edema with RUE with kinesiotaping, right knee without pain to palpation.  There is no tenderness along the patella tendon there is no evidence of medial or lateral joint line tenderness no pain with range of motion.  No evidence of effusion. Neurologic: Alert and oriented Motor:  RUE: 3+/5 proximal distal, stable RLE: 2/5 proximal to distal, stable, LLE 3- Skin: No evidence of breakdown, no evidence of rash Psych: Normal mood.  Normal behavior.   Assessment/Plan: 1. Functional deficits secondary to cardioembolic bicerebral infarcts which require 3+ hours per day of interdisciplinary therapy in a comprehensive inpatient rehab setting.  Physiatrist is providing close team supervision and 24 hour management of active medical problems listed below.  Physiatrist and rehab team continue to assess barriers to discharge/monitor patient progress toward functional and medical goals  Care Tool:  Bathing    Body parts bathed by patient: Chest, Abdomen, Left upper leg, Face, Right upper leg    Body parts bathed by helper: Front perineal area, Buttocks     Bathing assist Assist Level: 2 Helpers(bed level for perineal hygiene)     Upper Body Dressing/Undressing Upper body dressing Upper body dressing/undressing activity did not occur (including orthotics): Refused What is the patient wearing?: Pull over shirt    Upper body assist Assist Level: Moderate Assistance - Patient 50 - 74%    Lower Body Dressing/Undressing Lower body dressing      What is the patient wearing?: Pants     Lower body assist Assist for lower body dressing: Total Assistance - Patient < 25%     Toileting Toileting Toileting Activity did not occur Landscape architect and hygiene only): Refused  Toileting assist Assist for toileting: 2 Helpers Assistive Device Comment: BSC(BSC)   Transfers Chair/bed transfer  Transfers assist  Chair/bed transfer activity did not occur: Safety/medical concerns  Chair/bed transfer assist level: Maximal Assistance - Patient 25 - 49%     Locomotion Ambulation   Ambulation assist   Ambulation activity did not occur: Safety/medical concerns  Assist level: Dependent - Patient 0%(maxi sky walking sling ) Assistive device: Maxi Sky Max distance: 10f    Walk 10 feet activity   Assist  Walk 10 feet activity did not occur: Safety/medical concerns        Walk 50 feet activity   Assist Walk 50 feet with 2 turns activity did not occur: Safety/medical concerns         Walk 150 feet activity   Assist Walk 150 feet activity did not occur: Safety/medical concerns         Walk 10 feet on uneven surface  activity   Assist Walk 10 feet on uneven surfaces activity did not occur: Safety/medical concerns         Wheelchair     Assist Will patient use wheelchair at discharge?: Yes Type of Wheelchair: Manual    Wheelchair assist level: Minimal Assistance - Patient > 75% Max wheelchair distance: 75    Wheelchair 50 feet with 2  turns activity    Assist    Wheelchair 50 feet with 2 turns activity did not occur: Safety/medical concerns(limited by fatigue)   Assist Level: Minimal Assistance - Patient > 75%   Wheelchair 150 feet activity     Assist Wheelchair 150 feet activity did not occur: Safety/medical concerns          Medical Problem List and Plan: 1.Persistent right side weakness with decreased mobilitysecondary to cardioembolic bi-cerebral infarction  Continue CIR, PT, OT, speech,  Team conference today please see physician documentation under team conference tab, met with team face-to-face to discuss problems,progress, and goals. Formulized individual treatment plan based on medical history, underlying problem and comorbidities. 2. Antithrombotics: -DVT/anticoagulation:ELIQUIS -antiplatelet therapy:  3. Pain Management:Ultracet as needed, knee pain with weightbearing may be meniscal certainly no evidence of effusion 4. Mood:Provide emotional support  -appreciate neuropsych input re: coping with episodic stress/anxiety -antipsychotic agents: none 5. Neuropsych: This patientiscapable of making decisions on hisown behalf. 6. Skin/Wound Care:Routine skin checks 7. Fluids/Electrolytes/Nutrition:encourage PO 8. ABLA/ Upper GI bleed EGD 3. Follow-up GI services. Continue PPI -serial H/H's   -hgb 7.9 on 4/3  -1u PRBC Wednesday- 4/1    Labs refused will reorder later this wk 10. Chronic atrial fibrillation with TAVR procedure September 2019 11. Non-STEMI. Thought secondary to thrombus status post cardiac catheterization. Continue eliquis 12. Acute on chronic diastolic congestive heart failure. Lasix 40 mg daily  Filed Weights   02/25/19 0347 02/27/19 0705 02/28/19 0509  Weight: 111.7 kg 111.3 kg 111.2 kg   Stable on 4/8 13. Hypertension. Norvasc 5 mg daily, Lopressor 50 mg twice a day. Monitor with increased mobility Vitals:   02/27/19  2000 02/28/19 0509  BP: 116/77 105/60  Pulse: 61 60  Resp: 18 18  Temp: 98.3 F (36.8 C) 98.3 F (36.8 C)  SpO2: 100% 100%   controlled on 4/8 14. Hyperlipidemia. Lipitor 15. Persistent leukocytosis:  WBCs 12.9 on 4/3, improving  Afebrile   16.  Hypokalemia  Potassium 4.1 on 4/3 after supplementation  Labs refused will reorder later in week  LOS: 16 days A FACE TO Ashley E Kirsteins 02/28/2019, 11:41 AM

## 2019-02-28 NOTE — Patient Care Conference (Signed)
Inpatient RehabilitationTeam Conference and Plan of Care Update Date: 02/28/2019   Time: 10:50 AM    Patient Name: Joel Rogers      Medical Record Number: [id number]  Date of Birth: 20-Jun-1951 Sex: Male         Room/Bed: 4W22C/4W22C-01 Payor Info: Payor: [employer]vertising [employer] MEDICARE / Plan: [employer] MEDICARE / Product Type: *No Product type* /    Admitting Diagnosis: nultiple infarcts recent cardiac arrest mi  Admit Date/Time:  02/12/2019  2:30 PM Admission Comments: No comment available   Primary Diagnosis:  <principal problem not specified> Principal Problem: <principal problem not specified>  Patient Active Problem List   Diagnosis Date Noted  . Hypokalemia   . Leukocytosis   . Uncontrolled stage 2 hypertension   . Acute on chronic diastolic (congestive) heart failure (HCC)   . Acute blood loss anemia   . Posterior circulation stroke (HCC) 02/12/2019  . Cerebral embolism with cerebral infarction 02/09/2019  . Malnutrition of moderate degree 02/08/2019  . Pressure injury of skin 02/07/2019  . VT (ventricular tachycardia) (HCC)   . Acute coronary syndrome (HCC) 02/03/2019  . VF (ventricular fibrillation) (HCC) causing cardiac arrest 02/03/2019  . Acute bilateral cerebral infarction in a watershed distribution [facility]) 01/30/2019  . UGIB (upper gastrointestinal bleed) 01/26/2019  . Bacteremia due to Streptococcus 01/26/2019  . Cellulitis 01/26/2019  . Upper GI bleed   . Acute CVA (cerebrovascular accident) (HCC) 01/19/2019  . Acute renal failure (HCC) 01/14/2019  . Right shoulder pain 01/14/2019  . Sepsis (HCC) 01/13/2019  . S/P TAVR (transcatheter aortic valve replacement) 07/25/2018  . History of subdural hematoma   . Atrial fibrillation, chronic   . Acute on chronic diastolic heart failure (HCC) 06/15/2018  . Pulmonary hypertension, unspecified (HCC) 02/15/2018  . Varicose veins of right lower extremity with complications 01/10/2018  . History of Severe aortic stenosis  08/09/2017  . Vitamin D deficiency 12/02/2016  . Status post gastric bypass for obesity 11/02/2016  . HTN (hypertension) 04/13/2013  . Hyperlipemia 04/13/2013  . Chronic atrial fibrillation 02/14/2013  . Non-ST elevation (NSTEMI) myocardial infarction (HCC) 06/25/2008  . Morbid obesity (HCC) 06/18/2008  . Obstructive sleep apnea 06/18/2008    Expected Discharge Date: Expected Discharge Date: 03/02/2019  Team Members Present: Physician leading conference: Dr. Claudette Laws Social Worker Present: Dossie Der, LCSW Nurse Present: Doran Durand, LPN PT Present: Grier Rocher, PT;[name], PTA OT Present: Jackquline Denmark, OT SLP Present: Colin Benton, SLP PPS Coordinator present : Fae Pippin     Current Status/Progress Goal Weekly Team Focus  Medical   more motivated, hgb low but stable, no blood in stools , knee pain with therapy  Improve tolerance for activity  monitor H and H   Bowel/Bladder   continent of bowel & bladder, LBM 02/25/19  continue continence  continue present schedule & monitor for changes   Swallow/Nutrition/ Hydration             ADL's   total A LB, mod UB self care, mod A slide board transfers, self limiting  Min assist bathing/dressing and transfers, supervision grooming and sitting balance( will likely downgrade this week)  ADL retraining, R NMR, functional transfers   Mobility   modA supine to sit with increased time and use of bed feautres, max to maxA x 2 sit to supine, maxA x 1 - 2 STS from w/c and Stedy, mod-Max A SB transfer, modA "downhill" maxA level, minA w/c propulsion  currently set for Min assist bed mobility, transfers,  and gait. (may need to be downgraded)   R NMR, w/c mobility, strengthen, transfers, STS, SB transfers   Communication             Safety/Cognition/ Behavioral Observations  Mod-Min A   Min A  semi-complex problem solving, selective attention, recall strategies, emergent awareness and intellectual awarenes    Pain   ribs still sore when moved, but does not c/o pain, has ultracet prn  pain scale <4/10  assess & treat as needed   Skin   small stage 2 to sacrum is healing, foam continued, BLE improving, no redness or scaling, still has +3 pitting edema to BLE  no new areas of skin breakdown  assess q shift      *See Care Plan and progress notes for long and short-term goals.     Barriers to Discharge  Current Status/Progress Possible Resolutions Date Resolved   Physician    Medical stability     progressing slowly  monitor CBC as above      Nursing                  PT                    OT                  SLP                SW                Discharge Planning/Teaching Needs:  Wife and family committed to taking pt home at discharge. Will need to come in for education prior to discharge.      Team Discussion:  Making more progress in therapies this week and not as self limiting. Sacrum looks better and healing. Pain managed and not taking pain meds. Knee pain on affected side. PT & OT to downgrade ambulation goals to mod assist and some ADL's. Hemoglobin stable. Labs good. Working on Hydrologistsliding board transfers. Will need two days of family education with wife prior to DC home.  Revisions to Treatment Plan:  DC 4/21    Continued Need for Acute Rehabilitation Level of Care: The patient requires daily medical management by a physician with specialized training in physical medicine and rehabilitation for the following conditions: Daily direction of a multidisciplinary physical rehabilitation program to ensure safe treatment while eliciting the highest outcome that is of practical value to the patient.: Yes Daily medical management of patient stability for increased activity during participation in an intensive rehabilitation regime.: Yes Daily analysis of laboratory values and/or radiology reports with any subsequent need for medication adjustment of medical intervention for : Neurological  problems;Other   I attest that I was present, lead the team conference, and concur with the assessment and plan of the team. Teleconferecne held due to COVID-19   Lucy ChrisDupree, Janelle Spellman G 02/28/2019, 10:55 AM

## 2019-02-28 NOTE — Plan of Care (Signed)
  Problem: Consults Goal: RH STROKE PATIENT EDUCATION Description See Patient Education module for education specifics  Outcome: Progressing   Problem: RH BOWEL ELIMINATION Goal: RH STG MANAGE BOWEL WITH ASSISTANCE Description STG Manage Bowel with max Assistance.  Outcome: Progressing   Problem: RH BLADDER ELIMINATION Goal: RH STG MANAGE BLADDER WITH ASSISTANCE Description STG Manage Bladder With max Assistance  Outcome: Progressing   Problem: RH SKIN INTEGRITY Goal: RH STG SKIN FREE OF INFECTION/BREAKDOWN Description Maintain with min assist  Outcome: Progressing Goal: RH STG MAINTAIN SKIN INTEGRITY WITH ASSISTANCE Description STG Maintain Skin Integrity With min Assistance.  Outcome: Progressing   Problem: RH SAFETY Goal: RH STG ADHERE TO SAFETY PRECAUTIONS W/ASSISTANCE/DEVICE Description STG Adhere to Safety Precautions With cues/reminders Assistance/Device.  Outcome: Progressing   Problem: RH PAIN MANAGEMENT Goal: RH STG PAIN MANAGED AT OR BELOW PT'S PAIN GOAL Description At or below level 4  Outcome: Progressing   Problem: RH KNOWLEDGE DEFICIT Goal: RH STG INCREASE KNOWLEGDE OF HYPERLIPIDEMIA Description Pt will be able to manage HLD with medications and dietary restrictions with min assist  Outcome: Progressing Goal: RH STG INCREASE KNOWLEDGE OF STROKE PROPHYLAXIS Description Pt will be able to explain management of secondary stroke with diet and medications using handouts and resources with min assist  Outcome: Progressing Goal: RH STG INCREASE KNOWLEDGE OF HYPERTENSION Description Pt will be able to manage HTN with medications and dietary restrictions with min assist   Outcome: Progressing

## 2019-02-28 NOTE — Progress Notes (Signed)
Speech Language Pathology Daily Session Note  Patient Details  Name: Joel Rogers MRN: [id number] Date of Birth: 07-25-1951  Today's Date: 02/28/2019 SLP Individual Time: 0272-5366 SLP Individual Time Calculation (min): 45 min  Short Term Goals: Week 2: SLP Short Term Goal 1 (Week 2): Pt will utilize compensatory and external memory aids to recall, new daily information with Min A verbal cues. SLP Short Term Goal 2 (Week 2): Pt will complete semi-complex tasks with Mod A verbal cues for functional problem solving.  SLP Short Term Goal 3 (Week 2): Pt will demonstrate intellectual awareness by listing 3 physical and 3 cognitive deficits with Min A cues.  SLP Short Term Goal 4 (Week 2): Pt will demonstrate selective attention for ~ 30 minutes in moderately distracting environment with Min A cues.  SLP Short Term Goal 5 (Week 2): Pt will self-monitor and self-correct erros in 7 out of 10 opportunities with Mod A cues.   Skilled Therapeutic Interventions: Skilled ST services focused on cognitive skills. SLP facilitated recall of today's therapist names pt about to recall PT and required visual cue for OT and SLP, on board. SLP reviewed primary therapists, adding names to board and instructed pt to work on connect names to face/occupation, pt agreed. SLP facilitated semi-complex problem solving skills utilizing PEG design task, pt required min A fade to supervision A verbal cues (1/4th way through task) for problem solving/error correction. Pt demonstrated strong problem solving and error awareness, with external aid present to aid in recall. Pt was left in room with call bell within reach and chair alarm set. ST recommends to continue skilled ST services.      Pain Pain Assessment Pain Scale: 0-10 Pain Score: 0-No pain Faces Pain Scale: Hurts a little bit Pain Type: Acute pain Pain Location: Leg Pain Orientation: Right;Left Pain Descriptors / Indicators: Aching Pain Frequency:  Intermittent Pain Onset: Progressive Patients Stated Pain Goal: 0 Pain Intervention(s): Medication (See eMAR);Distraction;Relaxation Multiple Pain Sites: No  Therapy/Group: Individual Therapy  Joel Rogers  [facility] 02/28/2019, 12:23 PM

## 2019-02-28 NOTE — Progress Notes (Addendum)
Patient refused xray of knee. Stated "I am not  going to give anymore blood or take any new medications. I am going  Home on the 21st." Patient educated on why xray was ordered patient continues to refuse. PA Jesusita Oka was notified of refusal.  Kalman Shan, LPN

## 2019-02-28 NOTE — Progress Notes (Signed)
Physical Therapy Session Note  Patient Details  Name: Joel Rogers MRN: [id number] Date of Birth: June 20, 1951  Today's Date: 02/28/2019 PT Individual Time: 0800-0915 PT Individual Time Calculation (min): 75 min   Short Term Goals: Week 2:  PT Short Term Goal 1 (Week 2): Pt will perform bed mobility with Mod assist  PT Short Term Goal 1 - Progress (Week 2): Met PT Short Term Goal 2 (Week 2): Pt will perform bed<>WC transfer with mod assist and LRAD  PT Short Term Goal 2 - Progress (Week 2): Partly met PT Short Term Goal 3 (Week 2): Pt will ambulate 5 ft with Max assist +2 and LRAD   PT Short Term Goal 3 - Progress (Week 2): Not met PT Short Term Goal 4 (Week 2): Pt will propell WC 18f with supervision assist  PT Short Term Goal 4 - Progress (Week 2): Partly met Week 3:  PT Short Term Goal 1 (Week 3): Pt will perform bed<>WC transfer with min A consistantly  PT Short Term Goal 2 (Week 3): Pt will propell WC 1230fwith supevision assist  PT Short Term Goal 3 (Week 3): Pt will ambulate [id number] LRAD and max A + 2  Skilled Therapeutic Interventions/Progress Updates:   Pt received supine in bed and agreeable to PT. Supine>sit transfer with min assist, extra time and cues for scooting EOB.   Sit>stand from bed with RW at extreme elevated height and supervision assist. X 3. Attempted pregait reciprocal stepping, but unable to sustaion weight on the RLE.   SB transfer to WC [facility] min assist and set up from PT. Min-mod cues for LE positioning, posture, and UE placement to improve success. Pt transported to hall outside [employer] Sit>stand at rail in hall from [facility] after 3 attempts. Able to remain in standing for ~ 45 sec with UE support on rail on therapist. WC mobility x [id number] supervision assist from PT and min cues for full ROM and improved force with the RUE.   Kinetrom x 4 bouts x 1 min, 30cm/sec min cues for ful ROM on the R and symmetrical movements for each bout.    Patient returned to room and [facility] in reach and all needs met.              Therapy Documentation Precautions:  Precautions Precautions: Fall Restrictions Weight Bearing Restrictions: No    Pain: Pain Assessment Pain Scale: 0-10 Pain Score: 0-No pain Faces Pain Scale: Hurts a little bit Pain Type: Acute pain Pain Location: Leg Pain Orientation: Right;Left Pain Descriptors / Indicators: Aching Pain Frequency: Intermittent Pain Onset: Progressive Patients Stated Pain Goal: 0 Pain Intervention(s): Medication (See eMAR);Distraction;Relaxation Multiple Pain Sites: No    Therapy/Group: Individual Therapy  AustLorie Phenix/2020, 12:59 PM

## 2019-02-28 NOTE — Progress Notes (Signed)
Occupational Therapy Session Note  Patient Details  Name: Joel Rogers MRN: [id number] Date of Birth: 09-06-1951  Today's Date: 02/28/2019 OT Individual Time: 6256-3893 and 1430-1530 OT Individual Time Calculation (min): 58 min and 60 min   Short Term Goals: Week 2:  OT Short Term Goal 1 (Week 2): Pt will perform slide board transfer with max A consistently.  OT Short Term Goal 2 (Week 2): Pt will sit EOB with min A for sitting balance for 5 minutes during self care tasks.  OT Short Term Goal 3 (Week 2): Pt will perform UB dressing with mod A .  Skilled Therapeutic Interventions/Progress Updates:    Session 1: Upon entering the room, pt seated in wheelchair with no c/o pain but reports fatigue from prior PT session. Pt performed grooming tasks at sink with use of B UEs and increased time . OT assisted pt via wheelchair to gym for dynavision task. Pt performed dynavision task while seated with focus on anterior weight shift to hit targets. Pt using self guiding with R UE as needed to hit targets. Pt performed 2 sets of 1 minute timed sessions with average on L being 2.09 seconds and R being 2.67 seconds. Pt only able to tolerate 1 minute intervals. Pt engaged in shoulder strengthening exercises with 2 sets of 10 shoulder shrugs and shoulder squeezes. Pt returning to room and remained in wheelchair with call bell and all needed items within reach.   Session 2: Upon entering the room, pt seated in wheelchair with no c/o pain but upset over scheduled x-ray. Pt declined x-ray and OT notified RN. OT removing kinesiotape and assisted pt with hand hygiene for R UE with retrograde massage secondary to edema. Pt declined transfer training. OT continued to discuss with pt his need for wheelchair ramp when returning home. Paper handouts will be provided for further information as well. Pt remained seated in wheelchair at end with call bell and all needed items within reach.   Therapy  Documentation Precautions:  Precautions Precautions: Fall Restrictions Weight Bearing Restrictions: No  ADL: ADL Upper Body Bathing: Maximal assistance Where Assessed-Upper Body Bathing: Bed level Lower Body Bathing: Dependent Where Assessed-Lower Body Bathing: Bed level Upper Body Dressing: Maximal assistance Where Assessed-Upper Body Dressing: Bed level Lower Body Dressing: Dependent Where Assessed-Lower Body Dressing: Bed level   Therapy/Group: Individual Therapy  [name] 02/28/2019, 11:23 AM

## 2019-02-28 NOTE — Progress Notes (Signed)
Social Work Patient ID: Joel Rogers, male   DOB: 31-Jul-1951, 68 y.o.   MRN: [id number] Met with pt and spoke with wife via telephone to discuss team conference progress this week. Pt wants to go home ASAP due to tired of being here and wants to be at home to see his family. Wife feels  He needs to stay here as long as rehab is benefiting him and his discharge date 4/21. She will start working on a ramp and will be available to come in for education but feels he will need to stay and work with therapies. Pt knew wife would say this and encouraged them to work on a solution which both would be happy with. Pt is benefiting from rehab and is participating more this week than last week.

## 2019-03-01 ENCOUNTER — Inpatient Hospital Stay (HOSPITAL_COMMUNITY): Payer: Self-pay | Admitting: Physical Therapy

## 2019-03-01 ENCOUNTER — Inpatient Hospital Stay (HOSPITAL_COMMUNITY): Payer: Self-pay | Admitting: Occupational Therapy

## 2019-03-01 ENCOUNTER — Inpatient Hospital Stay (HOSPITAL_COMMUNITY): Payer: Self-pay | Admitting: Speech Pathology

## 2019-03-01 NOTE — Progress Notes (Signed)
Occupational Therapy Session Note  Patient Details  Name: Joel Rogers MRN: [id number] Date of Birth: 1951/11/01  Today's Date: 03/01/2019 OT Individual Time: 0732-0800 OT Individual Time Calculation (min): 28 min    Short Term Goals: Week 2:  OT Short Term Goal 1 (Week 2): Pt will perform slide board transfer with max A consistently.  OT Short Term Goal 2 (Week 2): Pt will sit EOB with min A for sitting balance for 5 minutes during self care tasks.  OT Short Term Goal 3 (Week 2): Pt will perform UB dressing with mod A .  Skilled Therapeutic Interventions/Progress Updates:    Treatment session with focus on self-feeding with use of RUE as gross assist when opening containers.  Pt received asleep in bed requiring increased time to fully arouse.  Pt deferred OOB activity at this time due to just waking and expressing desire to eat.  Engaged in bed mobility with pt able to utilize LUE and LLE to boost up in bed with assistance from therapist.  Positioned in chair position in bed for eating.  Pt utilized RUE intermittently as stabilizer-gross assist when opening containers.  Therapeutic use of self to engage pt in discussion regarding progress towards goals as pt expressing frustration with prolonged hospitalization and wanting to d/c home asap.  Encouraged pt to focus on the positives and identify areas of improvement each day. Pt encouraged by discussion and expressed gratitude for time spent this session on goals and achievement.  Pt remained upright in bed finishing breakfast with all needs in reach.  Therapy Documentation Precautions:  Precautions Precautions: Fall Restrictions Weight Bearing Restrictions: No General:   Vital Signs: Therapy Vitals Pulse Rate: 68 BP: 116/80 Pain:  Pt with no c/o pain   Therapy/Group: Individual Therapy  Rosalio Loud 03/01/2019, 8:09 AM

## 2019-03-01 NOTE — Progress Notes (Signed)
[place] PHYSICAL MEDICINE & REHABILITATION PROGRESS NOTE   Subjective/Complaints:  We discussed his blood draws.  His main concern is that we may discover something that would prolong his hospital stay.  We discussed that monitoring his hemoglobin would allow us to determine whether a transfusion would be needed but not likely to extend his stay  ROS: Denies CP, SOB, N/V/D  Objective:   No results found. No results for input(s): WBC, HGB, HCT, PLT in the last 72 hours. No results for input(s): NA, K, CL, CO2, GLUCOSE, BUN, CREATININE, CALCIUM in the last 72 hours.  Intake/Output Summary (Last 24 hours) at 03/01/2019 1017 Last data filed at 03/01/2019 0800 Gross per 24 hour  Intake 360 ml  Output 1875 ml  Net -1515 ml     Physical Exam: Vital Signs Blood pressure 116/80, pulse 68, temperature 98.3 F (36.8 C), temperature source Oral, resp. rate 18, weight 111.1 kg, SpO2 99 %. Constitutional: NAD.  Vital signs reviewed.Marland Kitchen. HENT: Normocephalic.  Atraumatic. Eyes: EOMI.  No discharge. Cardiovascular: No JVD. Respiratory: Normal effort. GI: Non-distended. Musc: Right-sided edema with RUE with kinesiotaping, right knee without pain to palpation.  There is no tenderness along the patella tendon there is no evidence of medial or lateral joint line tenderness no pain with range of motion.  No evidence of effusion. Neurologic: Alert and oriented Motor:  RUE: 3+/5 proximal distal, stable RLE: 2/5 proximal to distal, stable, LLE 3- Skin: No evidence of breakdown, no evidence of rash Psych: Normal mood.  Normal behavior.   Assessment/Plan: 1. Functional deficits secondary to cardioembolic bicerebral infarcts which require 3+ hours per day of interdisciplinary therapy in a comprehensive inpatient rehab setting.  Physiatrist is providing close team supervision and 24 hour management of active medical problems listed below.  Physiatrist and rehab team continue to assess barriers to  discharge/monitor patient progress toward functional and medical goals  Care Tool:  Bathing    Body parts bathed by patient: Chest, Abdomen, Left upper leg, Face, Right upper leg   Body parts bathed by helper: Front perineal area, Buttocks     Bathing assist Assist Level: 2 Helpers(bed level for perineal hygiene)     Upper Body Dressing/Undressing Upper body dressing Upper body dressing/undressing activity did not occur (including orthotics): Refused What is the patient wearing?: Pull over shirt    Upper body assist Assist Level: Moderate Assistance - Patient 50 - 74%    Lower Body Dressing/Undressing Lower body dressing      What is the patient wearing?: Pants     Lower body assist Assist for lower body dressing: Total Assistance - Patient < 25%     Toileting Toileting Toileting Activity did not occur Press photographer(Clothing management and hygiene only): Refused  Toileting assist Assist for toileting: 2 Helpers Assistive Device Comment: BSC(BSC)   Transfers Chair/bed transfer  Transfers assist  Chair/bed transfer activity did not occur: Safety/medical concerns  Chair/bed transfer assist level: Minimal Assistance - Patient > 75%(SB)     Locomotion Ambulation   Ambulation assist   Ambulation activity did not occur: Safety/medical concerns  Assist level: Dependent - Patient 0%(maxi sky walking sling ) Assistive device: Maxi Sky Max distance: 873ft    Walk 10 feet activity   Assist  Walk 10 feet activity did not occur: Safety/medical concerns        Walk 50 feet activity   Assist Walk 50 feet with 2 turns activity did not occur: Safety/medical concerns  Walk 150 feet activity   Assist Walk 150 feet activity did not occur: Safety/medical concerns         Walk 10 feet on uneven surface  activity   Assist Walk 10 feet on uneven surfaces activity did not occur: Safety/medical concerns         Wheelchair     Assist Will patient use  wheelchair at discharge?: Yes Type of Wheelchair: Manual    Wheelchair assist level: Contact Guard/Touching assist Max wheelchair distance: 170ft    Wheelchair 50 feet with 2 turns activity    Assist    Wheelchair 50 feet with 2 turns activity did not occur: Safety/medical concerns(limited by fatigue)   Assist Level: Contact Guard/Touching assist   Wheelchair 150 feet activity     Assist Wheelchair 150 feet activity did not occur: Safety/medical concerns          Medical Problem List and Plan: 1.Persistent right side weakness with decreased mobilitysecondary to cardioembolic bi-cerebral infarction  Continue CIR, PT, OT, speech,   2. Antithrombotics: -DVT/anticoagulation:ELIQUIS -antiplatelet therapy:  3. Pain Management:Ultracet as needed, knee pain with weightbearing may be meniscal certainly no evidence of effusion 4. Mood:Provide emotional support  -appreciate neuropsych input re: coping with episodic stress/anxiety -antipsychotic agents: none 5. Neuropsych: This patientiscapable of making decisions on hisown behalf. 6. Skin/Wound Care:Routine skin checks 7. Fluids/Electrolytes/Nutrition:encourage PO 8. ABLA/ Upper GI bleed EGD 3. Follow-up GI services. Continue PPI -serial H/H's   -hgb 7.9 on 4/3  -1u PRBC Wednesday- 4/1    Labs refused repeat in am 10. Chronic atrial fibrillation with TAVR procedure September 2019 11. Non-STEMI. Thought secondary to thrombus status post cardiac catheterization. Continue eliquis 12. Acute on chronic diastolic congestive heart failure. Lasix 40 mg daily  Filed Weights   02/27/19 0705 02/28/19 0509 03/01/19 0405  Weight: 111.3 kg 111.2 kg 111.1 kg   Stable on 4/9 13. Hypertension. Norvasc 5 mg daily, Lopressor 50 mg twice a day. Monitor with increased mobility Vitals:   03/01/19 0405 03/01/19 0808  BP: 110/82 116/80  Pulse: 62 68  Resp: 18   Temp: 98.3 F (36.8  C)   SpO2: 99%    controlled on 4/9 14. Hyperlipidemia. Lipitor 15. Persistent leukocytosis:  WBCs 12.9 on 4/3, improving  Afebrile   16.  Hypokalemia  Potassium 4.1 on 4/3 after supplementation  Labs refused will reorder later in week  LOS: 17 days A FACE TO FACE EVALUATION WAS PERFORMED  Erick Colace 03/01/2019, 10:17 AM

## 2019-03-01 NOTE — Progress Notes (Signed)
Occupational Therapy Session Note  Patient Details  Name: Joel Rogers MRN: [id number] Date of Birth: 09-Sep-1951  Today's Date: 03/01/2019 OT Individual Time: 9499-7182 OT Individual Time Calculation (min): 65 min    Short Term Goals: Week 2:  OT Short Term Goal 1 (Week 2): Pt will perform slide board transfer with max A consistently.  OT Short Term Goal 2 (Week 2): Pt will sit EOB with min A for sitting balance for 5 minutes during self care tasks.  OT Short Term Goal 3 (Week 2): Pt will perform UB dressing with mod A .  Skilled Therapeutic Interventions/Progress Updates:    pt received in bed and he stated that he was already bathed but he would like to use the bathroom.  Pt agreeable to trying the [facility]. Donned ted hose and shoes for pt.  Pt sat to EOB with min A and then was able to sit for at least 5 min while equipment gathered.  Pt did not have pants on yet, so used stedy vs sliding board.  Even with an elevated bed, pt had great difficulty coming to stand.  After numerous tries, pt needed max A of 2 to pull into standing. Once pt was up he was moved to [facility].  Pt needs to have the stedy removed once on [facility] so that he can adequately spread his legs to be able to position correctly on BSC.  From [facility], donned pants over feet with total A. Pt unable to rise to stand with stedy from elevated commode so needed +2 again. Pants adjusted over hips in standing and pt transferred to w/c.    Spent a great deal of time this session getting pt to work on rocking and forward weight shifting to move into standing as just trying to pull up was too difficult for him.   Pt in w/c with all needs met.    Therapy Documentation Precautions:  Precautions Precautions: Fall Restrictions Weight Bearing Restrictions: No       Pain: Pain Assessment Pain Scale: 0-10 Pain Score: 0-No pain  Therapy/Group: Individual Therapy  [place] 03/01/2019, 12:37 PM

## 2019-03-01 NOTE — Progress Notes (Signed)
Speech Language Pathology Weekly Progress and Session Note  Patient Details  Name: Joel Rogers MRN: [id number] Date of Birth: 21-Sep-1951  Beginning of progress report period: February 22, 2019 End of progress report period: March 01, 2019  Today's Date: 03/01/2019 SLP Individual Time: 0930-1030 SLP Individual Time Calculation (min): 60 min  Short Term Goals: Week 2: SLP Short Term Goal 1 (Week 2): Pt will utilize compensatory and external memory aids to recall, new daily information with Min A verbal cues. SLP Short Term Goal 1 - Progress (Week 2): Met SLP Short Term Goal 2 (Week 2): Pt will complete semi-complex tasks with Mod A verbal cues for functional problem solving.  SLP Short Term Goal 2 - Progress (Week 2): Met SLP Short Term Goal 3 (Week 2): Pt will demonstrate intellectual awareness by listing 3 physical and 3 cognitive deficits with Min A cues.  SLP Short Term Goal 3 - Progress (Week 2): Not met SLP Short Term Goal 4 (Week 2): Pt will demonstrate selective attention for ~ 30 minutes in moderately distracting environment with Min A cues.  SLP Short Term Goal 4 - Progress (Week 2): Progressing toward goal SLP Short Term Goal 5 (Week 2): Pt will self-monitor and self-correct erros in 7 out of 10 opportunities with Mod A cues.  SLP Short Term Goal 5 - Progress (Week 2): Not met    New Short Term Goals: Week 3: SLP Short Term Goal 1 (Week 3): Pt will complete semi-complex tasks with Min A verbal cues for functional problem solving.  SLP Short Term Goal 2 (Week 3): Pt will demonstrate selective attention for ~ 30 minutes in moderately distracting environment with Min A cues.  SLP Short Term Goal 3 (Week 3): Pt will self-monitor and self-correct errors in 7 out of 10 opportunities with Mod A cues.  SLP Short Term Goal 4 (Week 3): Pt will monitor participation in skilled interdisciplinary therapy sessions by completing calendar of participation in therapies.  SLP Short Term Goal 5  (Week 3): With Min A question cues, pt will recall compensatory memory strategies in 5 out of 10 opportunities.   Weekly Progress Updates: Pt has made progress within skilled ST sessions but overall functional progress has been limited d/t poor task tolerance and decline of therapy activities across disciplines. Pt fails to execute plan to increase physical and cognitive deficits to promote decreased caregiver burden at discharge. Plan in place to help pt self-monitor participation in therapies. He continues to require assitance with semi-complex problem solving, selective attention, emergent awareness as well as memory deficits.       Intensity: Minumum of 1-2 x/day, 30 to 90 minutes Frequency: 3 to 5 out of 7 days Duration/Length of Stay: 4/21 Treatment/Interventions: Cognitive remediation/compensation;Medication managment;Functional tasks;Internal/external aids;Environmental controls   Daily Session  Skilled Therapeutic Interventions: Skilled treatment session focused on creating emergent awareness of slow overall functional progress d/t self-limiting participation across therapies. Plan created to allow pt to monitor participation in all therapies. Additionally, pt able to begin demonstrating some insight into memory deficits. SLP spoke with pt's wife via telephone regarding need to increase participation in therapies. SLP further facilitiated session by providing Mod A cues to solve semi-complex functional mental math problems. Pt left upright in bed, bed alarm on and all needs within reach. Continue per current plan of care.    General    Pain Pain Assessment Pain Scale: 0-10 Pain Score: 0-No pain  Therapy/Group: Individual Therapy  Gearl Baratta 03/01/2019, 11:54 AM

## 2019-03-01 NOTE — Progress Notes (Signed)
Physical Therapy Session Note  Patient Details  Name: Joel Rogers MRN: [id number] Date of Birth: 05-22-1951  Today's Date: 03/01/2019 PT Individual Time: 1330-1450 PT Individual Time Calculation (min): 80 min   Short Term Goals: Week 3:  PT Short Term Goal 1 (Week 3): Pt will perform bed<>WC transfer with min A consistantly  PT Short Term Goal 2 (Week 3): Pt will propell WC 157f with supevision assist  PT Short Term Goal 3 (Week 3): Pt will ambulate 593fwith LRAD and max A + 2  Skilled Therapeutic Interventions/Progress Updates:   Pt received sitting in WC and agreeable to PT. [facility] table with min assist from PT for set up and to stabilize SB for level transfers. Moderate cues for LE positioning and improved anterior weight shift.   Sit<>stand from mat table at various heights throughout treatment x 10. Min assist from 25 and 24 inches x 7 mod assist and mod assist from 23 inches x 3. Moderate cues for LE positioning use of UE to push from mat table, and improved anterior weight shift.   Standing tolerance with BUE support on RW 1 min x 5 with lateral weight shifts R and L intermittently throughout each bout of standing tolerance. Reciprocal stepping 2x 2 BLE with BUE support of RW. PT applied anterior support AFO to the RLE on second bout for improved knee stability and provided moderate multimodal cues for weight shifting technique to improve ablilty to Lift contralateral LE.  [facility] as listed above.   PT applied KT tape the R hand and forearm for edema management. 5% stretch to anchor for fingers to proximal forearm and 5 % stretch from elbow to proximal forearm, both pieces of KT tape anchored proximally.   [facility] returned to [facility] and [facility] and all needs met.        Therapy Documentation Precautions:  Precautions Precautions: Fall Restrictions Weight  Bearing Restrictions: No Pain: Pain Assessment Pain Scale: 0-10 Pain Score: 0-No pain    Therapy/Group: Individual Therapy  AuLorie Phenix/07/2019, 3:03 PM

## 2019-03-02 ENCOUNTER — Inpatient Hospital Stay (HOSPITAL_COMMUNITY): Payer: Self-pay | Admitting: Occupational Therapy

## 2019-03-02 ENCOUNTER — Inpatient Hospital Stay (HOSPITAL_COMMUNITY): Payer: Self-pay

## 2019-03-02 ENCOUNTER — Inpatient Hospital Stay (HOSPITAL_COMMUNITY): Payer: Self-pay | Admitting: Speech Pathology

## 2019-03-02 NOTE — Progress Notes (Signed)
[place] PHYSICAL MEDICINE & REHABILITATION PROGRESS NOTE   Subjective/Complaints:  "I can't give you another week" Pt feeling down, not sure what is going on in the family  ROS: Denies CP, SOB, N/V/D  Objective:   No results found. No results for input(s): WBC, HGB, HCT, PLT in the last 72 hours. No results for input(s): NA, K, CL, CO2, GLUCOSE, BUN, CREATININE, CALCIUM in the last 72 hours.  Intake/Output Summary (Last 24 hours) at 03/02/2019 1016 Last data filed at 03/02/2019 1003 Gross per 24 hour  Intake 720 ml  Output 3025 ml  Net -2305 ml     Physical Exam: Vital Signs Blood pressure 118/80, pulse 61, temperature 99.2 F (37.3 C), temperature source Oral, resp. rate 16, weight 109.2 kg, SpO2 100 %. Constitutional: NAD.  Vital signs reviewed.Marland Kitchen. HENT: Normocephalic.  Atraumatic. Eyes: EOMI.  No discharge. Cardiovascular: No JVD. Respiratory: Normal effort. GI: Non-distended. Musc: Right-sided edema with RUE with kinesiotaping, right knee without pain to palpation.  There is no tenderness along the patella tendon there is no evidence of medial or lateral joint line tenderness no pain with range of motion.  No evidence of effusion. Neurologic: Alert and oriented Motor:  RUE: 3+/5 proximal distal, stable RLE: 2/5 proximal to distal, stable, LLE 3- Skin: No evidence of breakdown, no evidence of rash Psych: Normal mood.  Normal behavior.   Assessment/Plan: 1. Functional deficits secondary to cardioembolic bicerebral infarcts which require 3+ hours per day of interdisciplinary therapy in a comprehensive inpatient rehab setting.  Physiatrist is providing close team supervision and 24 hour management of active medical problems listed below.  Physiatrist and rehab team continue to assess barriers to discharge/monitor patient progress toward functional and medical goals  Care Tool:  Bathing    Body parts bathed by patient: Chest, Abdomen, Left upper leg, Face, Right  upper leg   Body parts bathed by helper: Front perineal area, Buttocks     Bathing assist Assist Level: 2 Helpers(bed level for perineal hygiene)     Upper Body Dressing/Undressing Upper body dressing Upper body dressing/undressing activity did not occur (including orthotics): Refused What is the patient wearing?: Pull over shirt    Upper body assist Assist Level: Moderate Assistance - Patient 50 - 74%    Lower Body Dressing/Undressing Lower body dressing      What is the patient wearing?: Pants     Lower body assist Assist for lower body dressing: Total Assistance - Patient < 25%     Toileting Toileting Toileting Activity did not occur Press photographer(Clothing management and hygiene only): Refused  Toileting assist Assist for toileting: 2 Helpers Assistive Device Comment: BSC   Transfers Chair/bed transfer  Transfers assist  Chair/bed transfer activity did not occur: Safety/medical concerns  Chair/bed transfer assist level: Minimal Assistance - Patient > 75%     Locomotion Ambulation   Ambulation assist   Ambulation activity did not occur: Safety/medical concerns  Assist level: Dependent - Patient 0%(maxi sky walking sling ) Assistive device: Maxi Sky Max distance: 143ft    Walk 10 feet activity   Assist  Walk 10 feet activity did not occur: Safety/medical concerns        Walk 50 feet activity   Assist Walk 50 feet with 2 turns activity did not occur: Safety/medical concerns         Walk 150 feet activity   Assist Walk 150 feet activity did not occur: Safety/medical concerns         Walk 10 feet  on uneven surface  activity   Assist Walk 10 feet on uneven surfaces activity did not occur: Safety/medical concerns         Wheelchair     Assist Will patient use wheelchair at discharge?: Yes Type of Wheelchair: Manual    Wheelchair assist level: Contact Guard/Touching assist Max wheelchair distance: 175ft    Wheelchair 50 feet with 2  turns activity    Assist    Wheelchair 50 feet with 2 turns activity did not occur: Safety/medical concerns(limited by fatigue)   Assist Level: Contact Guard/Touching assist   Wheelchair 150 feet activity     Assist Wheelchair 150 feet activity did not occur: Safety/medical concerns          Medical Problem List and Plan: 1.Persistent right side weakness with decreased mobilitysecondary to cardioembolic bi-cerebral infarction  Continue CIR, PT, OT, speech,  We discussed that he still requires max A for slid eboard transfer 2. Antithrombotics: -DVT/anticoagulation:ELIQUIS -antiplatelet therapy:  3. Pain Management:Ultracet as needed, knee pain with weightbearing may be meniscal certainly no evidence of effusion 4. Mood:Provide emotional support  -appreciate neuropsych input re: coping with episodic stress/anxiety -antipsychotic agents: none 5. Neuropsych: This patientiscapable of making decisions on hisown behalf. 6. Skin/Wound Care:Routine skin checks 7. Fluids/Electrolytes/Nutrition:encourage PO 8. ABLA/ Upper GI bleed EGD 3. Follow-up GI services. Continue PPI -serial H/H's   -hgb 7.9 on 4/3  -1u PRBC Wednesday- 4/1    Labs refused repeat 4/13 10. Chronic atrial fibrillation with TAVR procedure September 2019 11. Non-STEMI. Thought secondary to thrombus status post cardiac catheterization. Continue eliquis 12. Acute on chronic diastolic congestive heart failure. Lasix 40 mg daily  Filed Weights   02/28/19 0509 03/01/19 0405 03/02/19 0452  Weight: 111.2 kg 111.1 kg 109.2 kg   Stable on 4/10 13. Hypertension. Norvasc 5 mg daily, Lopressor 50 mg twice a day. Monitor with increased mobility Vitals:   03/01/19 1944 03/02/19 0452  BP: 123/79 118/80  Pulse: 67 61  Resp: 17 16  Temp: 98 F (36.7 C) 99.2 F (37.3 C)  SpO2: 100% 100%   controlled on 4/10 14. Hyperlipidemia. Lipitor 15. Persistent  leukocytosis:  WBCs 12.9 on 4/3, improving  Afebrile   16.  Hypokalemia  Potassium 4.1 on 4/3 after supplementation  Labs refused will reorder later in week  LOS: 18 days A FACE TO FACE EVALUATION WAS PERFORMED  Erick Colace 03/02/2019, 10:16 AM

## 2019-03-02 NOTE — Progress Notes (Signed)
Physical Therapy Session Note  Patient Details  Name: Joel Rogers MRN: [id number] Date of Birth: Oct 25, 1951  Today's Date: 03/02/2019 PT Individual Time: 1100-1145 PT Individual Time Calculation (min): 45 min   Short Term Goals: Week 3:  PT Short Term Goal 1 (Week 3): Pt will perform bed<>WC transfer with min A consistantly  PT Short Term Goal 2 (Week 3): Pt will propell WC 144ft with supevision assist  PT Short Term Goal 3 (Week 3): Pt will ambulate 35ft with LRAD and max A + 2  Skilled Therapeutic Interventions/Progress Updates:     Patient in w/c upon PT arrival. Patient alert and agreeable to PT session. Patient stated that he had a BM prior to PT arrival and would need cleaned up. Required total a for peri-care and clothing management during functional mobility during session.  Therapeutic Activity: Bed Mobility: Patient performed rolling L and R with mod A for LE positioning and rolling using bed rails and mod-max to sustain side-lying during peri-care. He performed supine to/from sit with min a for LE management. Provided verbal cues for pushing through UE to sit up. Transfers: Patient performed slide board transfers to and from the bed from the w/c with mod A x1 and CGA x1 except for last scoot back into the w/c required min A. Patient provided cues for board placement and assistance form PT using teach back method.  Patient in w/c at end of session with breaks locked, seat belt alarm set, and all needs within reach.   Therapy Documentation Precautions:  Precautions Precautions: Fall Restrictions Weight Bearing Restrictions: No Pain: Pain Assessment Pain Scale: 0-10 Pain Score: 0-No pain    Therapy/Group: Individual Therapy  Helayne Seminole, PT, DPT 03/02/2019, 4:47 PM

## 2019-03-02 NOTE — Progress Notes (Signed)
Occupational Therapy Weekly Progress Note  Patient Details  Name: Joel Rogers MRN: [id number] Date of Birth: January 01, 1951  Beginning of progress report period: 02/21/19 End of progress report period: 03/02/19   Today's Date: 03/02/2019 OT Individual Time: 4627-0350 and 0938-1829 OT Individual Time Calculation (min): 73 min and 42 min  Patient has met 2 of 3 short term goals.   Pt has made limited progress at time of report. He exhibits the ability to complete slideboard transfers with Min A, but most often requires Mod-Max A. Functional gains have been affected by self limiting behaviors, including his own lack of insight into deficits. Education emphasis has been placed on slideboard transfers and lateral lean technique in prep for functional tasks at home. AE use has been initiated to increase independence with LB self care as well. Continue POC.     Patient continues to demonstrate the following deficits: muscle weakness, decreased cardiorespiratoy endurance, unbalanced muscle activation and decreased coordination, decreased awareness, decreased problem solving, decreased safety awareness and decreased memory and decreased postural control, decreased sitting balance, decreased standing balance, and therefore will continue to benefit from skilled OT intervention to enhance overall performance with BADL.  Patient progressing toward long term goals..  Continue plan of care.  OT Short Term Goals Week 2:  OT Short Term Goal 1 (Week 2): Pt will perform slide board transfer with max A consistently.  OT Short Term Goal 1 - Progress (Week 2): Progressing toward goal OT Short Term Goal 2 (Week 2): Pt will sit EOB with min A for sitting balance for 5 minutes during self care tasks.  OT Short Term Goal 2 - Progress (Week 2): Met OT Short Term Goal 3 (Week 2): Pt will perform UB dressing with mod A . OT Short Term Goal 3 - Progress (Week 2): Met  Skilled Therapeutic Interventions/Progress Updates:     Pt greeted in bed, finishing up his grits. He declined to sit EOB or w/c level to eat. With encouragement, pt did use R UE to stabilize his bowl. Discussed building a ramp at home, offered to look up installation services near home and print them off, so he could make phone calls during CIR stay. Pt declined, verbalizing that he had a friend recently install a ramp, and he would just call the friend to see who built his. Afterwards, discussed bed mobility at home. Per pt, he has a bed-chair, which he uses to lift himself into standing. Therefore we did not attempt supine<sit from flat bed. Mod A for supine<sit using bed functions, and also for recripricol scooting EOB. To work on increasing independence with LB dressing, instructed pt on reacher use to thread LEs into pants using hemi techniques. Once OT donned Teds and sneakers, worked on core strengthening via lateral leans to pull pants over hips (Mod A for Rt>Lt leaning onto elbows). Due to tight pants, and c/o R LE pain during leaning, pants were fully elevated when rolling in bed, with Mod A going towards Rt. When he returned to sitting, he completed slideboard transfer<w/c with overall Max A, vcs for head/hip relationship and trunk flexion. Pt able to complete mini scoots with increased time and rest breaks, but due to R LE pain, he required Max A overall. Mod A for scooting back in w/c post transfer. Pt then completed handwashing with setup, and was left with half lap tray, safety belt fastened, all needs, and RN present.    2nd Session (1:1 tx, 42 min) Pt greeted in  w/c, reported having a "bad day" and feeling "frustrated." Provided encouragement and emotional support, with pt agreeable to start session via oral care and handwashing at sink. He required vcs to increase functional use of Rt. Afterwards, pt expressed interest in attempting to use the [facility]. OT adjusted BSC height in prep for level slideboard transfer. He scooted halfway across board twice,  but perseverated on pants being too tight and not being able to push himself enough using L LE. Noted self limiting behaviors, though provided pt with cuing and increased time to transfer at max level of independence. He needed 2 helpers to scoot halfway to [facility] with manual facilitation for head-hips relationship, declined going further, and then scooted back to w/c with Min A. At end of session we discussed his OT goals for transition home, emphasizing slideboard use and lateral lean techniques. Pt verbalized understanding, though stated that at home things will be easier, and he will be able to use the walker. Continued education regarding his present deficits to increase insight and safety awareness. Pt left with all needs within reach, safety belt fastened, and half lap tray. Notified RN to attempt slideboard BSC transfer with +2 assist in the evening, once his spouse provided pt with suitable pants.    Therapy Documentation Precautions:  Precautions Precautions: Fall Restrictions Weight Bearing Restrictions: No Vital Signs: Therapy Vitals Temp: 99.2 F (37.3 C) Temp Source: Oral Pulse Rate: 61 Resp: 16 BP: 118/80 Patient Position (if appropriate): Lying Oxygen Therapy SpO2: 100 % O2 Device: Room Air Pain: Ice provided to R LE at end of session. Notified RN of his c/o pain. During 2nd session, pt reported R LE pain had absolved. He still reports rib pain with movement, but refuses for OT to ask RN for medication to address   ADL: ADL Upper Body Bathing: Maximal assistance Where Assessed-Upper Body Bathing: Bed level Lower Body Bathing: Dependent Where Assessed-Lower Body Bathing: Bed level Upper Body Dressing: Maximal assistance Where Assessed-Upper Body Dressing: Bed level Lower Body Dressing: Dependent Where Assessed-Lower Body Dressing: Bed level      Therapy/Group: Individual Therapy  Tine Mabee A Amarilis Belflower 03/02/2019, 7:06 AM

## 2019-03-02 NOTE — Plan of Care (Signed)
  Problem: RH BLADDER ELIMINATION Goal: RH STG MANAGE BLADDER WITH ASSISTANCE Description STG Manage Bladder With max Assistance  Outcome: Progressing   Problem: RH SKIN INTEGRITY Goal: RH STG SKIN FREE OF INFECTION/BREAKDOWN Description Maintain with min assist  Outcome: Progressing

## 2019-03-02 NOTE — Progress Notes (Signed)
Speech Language Pathology Daily Session Note  Patient Details  Name: SAMARION COXWELL MRN: [id number] Date of Birth: [birth year]  Today's Date: 03/02/2019 SLP Individual Time: 0935-1000 SLP Individual Time Calculation (min): 25 min  Short Term Goals: Week 3: SLP Short Term Goal 1 (Week 3): Pt will complete semi-complex tasks with Min A verbal cues for functional problem solving.  SLP Short Term Goal 2 (Week 3): Pt will demonstrate selective attention for ~ 30 minutes in moderately distracting environment with Min A cues.  SLP Short Term Goal 3 (Week 3): Pt will self-monitor and self-correct errors in 7 out of 10 opportunities with Mod A cues.  SLP Short Term Goal 4 (Week 3): Pt will monitor participation in skilled interdisciplinary therapy sessions by completing calendar of participation in therapies.  SLP Short Term Goal 5 (Week 3): With Min A question cues, pt will recall compensatory memory strategies in 5 out of 10 opportunities.   Skilled Therapeutic Interventions:  Session 1:  Pt was seen for skilled ST targeting cognitive goals.  SLP facilitated the session with a novel card game targeting use of memory compensatory strategies, specifically word-picture associations.  Pt was able to generate and then recall associations for 100% accuracy with mod I.  Pt was also 100% accurate for naming and recall of specific category members during generative naming portion of task.  Pt was left in chair with chair alarm set and call bell within reach.  Continue per current plan of care.   Session 2:     Pain Pain Assessment Pain Scale: 0-10 Pain Score: 0-No pain  Therapy/Group: Individual Therapy  Starla Deller, Melanee Spry 03/02/2019, 12:46 PM

## 2019-03-02 NOTE — Progress Notes (Signed)
Speech Language Pathology Daily Session Note  Patient Details  Name: LAVERN LANGLITZ MRN: [id number] Date of Birth: [birth year]  Today's Date: 03/02/2019 SLP Individual Time: 1400-1430 SLP Individual Time Calculation (min): 30 min  Short Term Goals: Week 3: SLP Short Term Goal 1 (Week 3): Pt will complete semi-complex tasks with Min A verbal cues for functional problem solving.  SLP Short Term Goal 2 (Week 3): Pt will demonstrate selective attention for ~ 30 minutes in moderately distracting environment with Min A cues.  SLP Short Term Goal 3 (Week 3): Pt will self-monitor and self-correct errors in 7 out of 10 opportunities with Mod A cues.  SLP Short Term Goal 4 (Week 3): Pt will monitor participation in skilled interdisciplinary therapy sessions by completing calendar of participation in therapies.  SLP Short Term Goal 5 (Week 3): With Min A question cues, pt will recall compensatory memory strategies in 5 out of 10 opportunities.   Skilled Therapeutic Interventions:  Pt was seen for skilled ST targeting cognitive goals.  SLP facilitated the session with a deductive reasoning puzzle to address goals for functional problem solving.  Pt was able to complete task for 100% accuracy with intermittent supervision verbal cues for task organization and attention to detail.  Pt selectively attended to task in a moderately distracting environment for its duration (~20 min) with no cues needed for redirection.  Pt was left in chair with chair alarm set and call bell within reach.  Continue per current plan of care.    Pain Pain Assessment Pain Scale: 0-10 Pain Score: 0-No pain  Therapy/Group: Individual Therapy  Johneisha Broaden, Melanee Spry 03/02/2019, 4:12 PM

## 2019-03-03 ENCOUNTER — Inpatient Hospital Stay (HOSPITAL_COMMUNITY): Payer: Self-pay | Admitting: Physical Therapy

## 2019-03-03 NOTE — Progress Notes (Addendum)
Joel Rogers is a 68 y.o. male 12-31-1950 [id number]  Subjective: No new complaints. No new problems. Slept well. Feeling OK.  The patient wants to go home to see his family.  Objective: Vital signs in last 24 hours: Temp:  [97.6 F (36.4 C)-98.3 F (36.8 C)] 97.6 F (36.4 C) (04/11 0427) Pulse Rate:  [62-75] 62 (04/11 0427) Resp:  [16-18] 16 (04/11 0427) BP: (95-106)/(62-77) 95/66 (04/11 0427) SpO2:  [100 %] 100 % (04/11 0427) Weight:  [107.5 kg] 107.5 kg (04/11 0426) Weight change: -1.7 kg Last BM Date: 03/02/19  Intake/Output from previous day: 04/10 0701 - 04/11 0700 In: -  Out: 2510 [Urine:2510] Last cbgs: CBG (last 3)  No results for input(s): GLUCAP in the last 72 hours.   Physical Exam General: No apparent distress   HEENT: not dry Lungs: Normal effort. Lungs clear to auscultation, no crackles or wheezes. Cardiovascular: irregular rate and rhythm, no edema Abdomen: S/NT/ND; BS(+) Musculoskeletal:  unchanged Neurological: No new neurological deficits Wounds: N/A    Skin: clear  Aging changes Mental state: Alert, oriented, cooperative    Lab Results: BMET    Component Value Date/Time   NA 136 02/23/2019 0934   NA 141 08/03/2018 1421   K 4.1 02/23/2019 0934   CL 108 02/23/2019 0934   CO2 23 02/23/2019 0934   GLUCOSE 112 (H) 02/23/2019 0934   BUN 17 02/23/2019 0934   BUN 16 08/03/2018 1421   CREATININE 0.89 02/23/2019 0934   CREATININE 1.23 11/01/2018 1206   CALCIUM 8.1 (L) 02/23/2019 0934   GFRNONAA >60 02/23/2019 0934   GFRNONAA 74 05/01/2018 0913   GFRAA >60 02/23/2019 0934   GFRAA 86 05/01/2018 0913   CBC    Component Value Date/Time   WBC 12.9 (H) 02/23/2019 0934   RBC 3.04 (L) 02/23/2019 0934   HGB 7.9 (L) 02/23/2019 0934   HGB 10.9 (L) 06/27/2018 1050   HCT 24.7 (L) 02/23/2019 0934   HCT 33.1 (L) 06/27/2018 1050   PLT 206 02/23/2019 0934   PLT 260 06/27/2018 1050   MCV 81.3 02/23/2019 0934   MCV 73 (L) 06/27/2018 1050   MCH 26.0  02/23/2019 0934   MCHC 32.0 02/23/2019 0934   RDW 18.5 (H) 02/23/2019 0934   RDW 19.5 (H) 06/27/2018 1050   LYMPHSABS 1.6 02/13/2019 0511   MONOABS 1.3 (H) 02/13/2019 0511   EOSABS 0.1 02/13/2019 0511   BASOSABS 0.1 02/13/2019 0511    Studies/Results: No results found.  Medications: I have reviewed the patient's current medications.  Assessment/Plan:   1.  Right-sided weakness secondary to cardioembolic bi-cerebral infarction.  Continue CIR 2.  DVT prophylaxis with Eliquis 3.  Pain management with Ultracet as needed 4.  Stress and anxiety.  Discussed 5.  Acute blood loss anemia.  Status post recurrent upper GI bleeding.  Continue PPI.  Monitor hemoglobins 6.  Chronic atrial fibrillation with TAVR procedure in September 2019.  On Eliquis 7.  Non-STEMI.  Continue Eliquis 8.  Hyperlipidemia.  On Lipitor 9.  Leukocytosis-continue to monitor CBC 10.  Hypokalemia.  Monitoring potassium     Length of stay, days: 19  Sonda Primes , MD 03/03/2019, 11:19 AM

## 2019-03-03 NOTE — Progress Notes (Signed)
Physical Therapy Session Note  Patient Details  Name: Joel Rogers MRN: [id number] Date of Birth: 1951/05/07  Today's Date: 03/03/2019 PT Individual Time: 1100-1210 PT Individual Time Calculation (min): 70 min   Short Term Goals: Week 3:  PT Short Term Goal 1 (Week 3): Pt will perform bed<>WC transfer with min A consistantly  PT Short Term Goal 2 (Week 3): Pt will propell WC 124f with supevision assist  PT Short Term Goal 3 (Week 3): Pt will ambulate 557fwith LRAD and max A + 2 Week 4:     Skilled Therapeutic Interventions/Progress Updates:   Pt received supine in bed and agreeable to PT. Supine>sit transfer with min assist and increased time. PT assisted pt to don Teds, Shoes and pants on the EOB. superivsion assist from PT for sitting balance when moving LE. Sit>stand to pull pants to waist from 26inch bed height and CGA from PT. Additional sit<>stand form EOB x 5 with CGA from PT and to stabilize RW. Moderate cues for anterior weight shift. pregait weight shifting and attempted march in place increased by PT x 4 attempts. Pt unable to fully WB through the RLE to lift LLE off floor. SB [facility] min assist to place and stabilize SB as well as improve BLE positioning. [facility] with mod-max assist x 2. PT blocking the RLE and stabilizing hip for perform 2 x 2 RLE foot raises. No knee instability noted. Patient returned to room and left [facility] in reach and all needs met.         Therapy Documentation Precautions:  Precautions Precautions: Fall Restrictions Weight Bearing Restrictions: No Pain: 0/10    Therapy/Group: Individual Therapy  AuLorie Phenix/09/2019, 12:22 PM

## 2019-03-04 ENCOUNTER — Inpatient Hospital Stay (HOSPITAL_COMMUNITY): Payer: Self-pay | Admitting: Occupational Therapy

## 2019-03-04 NOTE — Progress Notes (Signed)
Occupational Therapy Session Note  Patient Details  Name: Joel Rogers MRN: [id number] Date of Birth: [birth year]  Today's Date: 03/04/2019 OT Individual Time: 2010-0712 OT Individual Time Calculation (min): 45 min   Skilled Therapeutic Interventions/Progress Updates:    Pt greeted in bed, initially refusing tx. "No- it's Sunday. I'm not getting up." With encouragement, pt amenable to get his pants on transfer to the w/c. Min A for supine<sit with HOB elevated. Pt used reacher EOB to thread LEs into pants with encouragement and instruction. Sit<stand with RW (bed elevated to 26 inches) completed with Mod A and vcs for hand placement and rocking technique. It took him several attempts. OT elevated pants over hips while he stood with steady assist for balance. Pt returned to sitting, and transferred to w/c using slideboard with steady assist and increased time. OT also changed w/c cushion due to pt c/o discomfort. He used the urinal with Min A while seated as well. After handwashing, pt was left in w/c with all needs within reach and safety belt fastened. Tx focus placed on ADL retraining, sit<stands, and functional transfers.   Therapy Documentation Precautions:  Precautions Precautions: Fall Restrictions Weight Bearing Restrictions: No Vital Signs: Therapy Vitals Temp: 98 F (36.7 C) Temp Source: Oral Pulse Rate: 68 Resp: 16 BP: 109/74 Patient Position (if appropriate): Lying Oxygen Therapy SpO2: 99 % O2 Device: Room Air Pain: He c/o R LE pain "sometimes" with movement, absolves with seated rest.    ADL: ADL Upper Body Bathing: Maximal assistance Where Assessed-Upper Body Bathing: Bed level Lower Body Bathing: Dependent Where Assessed-Lower Body Bathing: Bed level Upper Body Dressing: Maximal assistance Where Assessed-Upper Body Dressing: Bed level Lower Body Dressing: Dependent Where Assessed-Lower Body Dressing: Bed level      Therapy/Group: Individual  Therapy  Treon Kehl A Ikaika Showers 03/04/2019, 4:40 PM

## 2019-03-04 NOTE — Progress Notes (Signed)
Joel Rogers is a 68 y.o. male [birth year] [id number]  Subjective: No new complaints. No new problems. Slept well. Feeling OK.  Objective: Vital signs in last 24 hours: Temp:  [97.7 F (36.5 C)-98.6 F (37 C)] 98.1 F (36.7 C) (04/12 0419) Pulse Rate:  [60-67] 67 (04/12 0419) Resp:  [14-18] 16 (04/12 0419) BP: (103-119)/(64-67) 119/67 (04/12 0419) SpO2:  [98 %-100 %] 98 % (04/12 0419) Weight:  [108.2 kg] 108.2 kg (04/12 0419) Weight change: 0.7 kg Last BM Date: 03/04/19  Intake/Output from previous day: 04/11 0701 - 04/12 0700 In: 720 [P.O.:720] Out: 1900 [Urine:1900] Last cbgs: CBG (last 3)  No results for input(s): GLUCAP in the last 72 hours.   Physical Exam General: No apparent distress   HEENT: not dry Lungs: Normal effort. Lungs clear to auscultation, no crackles or wheezes. Cardiovascular: irregular rate and rhythm, no edema Abdomen: S/NT/ND; BS(+) Musculoskeletal:  unchanged Neurological: No new neurological deficits Wounds: N/A    Skin: clear  Aging changes Mental state: Alert, oriented, cooperative    Lab Results: BMET    Component Value Date/Time   NA 136 02/23/2019 0934   NA 141 08/03/2018 1421   K 4.1 02/23/2019 0934   CL 108 02/23/2019 0934   CO2 23 02/23/2019 0934   GLUCOSE 112 (H) 02/23/2019 0934   BUN 17 02/23/2019 0934   BUN 16 08/03/2018 1421   CREATININE 0.89 02/23/2019 0934   CREATININE 1.23 11/01/2018 1206   CALCIUM 8.1 (L) 02/23/2019 0934   GFRNONAA >60 02/23/2019 0934   GFRNONAA 74 05/01/2018 0913   GFRAA >60 02/23/2019 0934   GFRAA 86 05/01/2018 0913   CBC    Component Value Date/Time   WBC 12.9 (H) 02/23/2019 0934   RBC 3.04 (L) 02/23/2019 0934   HGB 7.9 (L) 02/23/2019 0934   HGB 10.9 (L) 06/27/2018 1050   HCT 24.7 (L) 02/23/2019 0934   HCT 33.1 (L) 06/27/2018 1050   PLT 206 02/23/2019 0934   PLT 260 06/27/2018 1050   MCV 81.3 02/23/2019 0934   MCV 73 (L) 06/27/2018 1050   MCH 26.0 02/23/2019 0934   MCHC 32.0  02/23/2019 0934   RDW 18.5 (H) 02/23/2019 0934   RDW 19.5 (H) 06/27/2018 1050   LYMPHSABS 1.6 02/13/2019 0511   MONOABS 1.3 (H) 02/13/2019 0511   EOSABS 0.1 02/13/2019 0511   BASOSABS 0.1 02/13/2019 0511    Studies/Results: No results found.  Medications: I have reviewed the patient's current medications.  Assessment/Plan:   1.  Right-sided weakness secondary to cardioembolic bi-cerebral infarction.  Continue CIR 2.  DVT prophylaxis with Eliquis 3.  Pain management with Ultracet as needed 4.  Stress and anxiety.  Discussed 5.  Acute blood loss anemia.  Status post recurrent upper GI bleeding.  Continue PPI.  Monitor hemoglobin 6.  Chronic atrial fibrillation with T AVR procedure in September 2019.  On Eliquis 7.  Non-STEMI.  Continue Eliquis 8.  Dyslipidemia.  Continue atorvastatin 9.  Leukocytosis.  Monitoring CBC 10.  Hypokalemia.  Monitor potassium       Length of stay, days: 20  Sonda Primes , MD 03/04/2019, 2:33 PM

## 2019-03-05 ENCOUNTER — Inpatient Hospital Stay (HOSPITAL_COMMUNITY): Payer: Self-pay | Admitting: Speech Pathology

## 2019-03-05 ENCOUNTER — Inpatient Hospital Stay (HOSPITAL_COMMUNITY): Payer: Self-pay | Admitting: Occupational Therapy

## 2019-03-05 ENCOUNTER — Inpatient Hospital Stay (HOSPITAL_COMMUNITY): Payer: Self-pay

## 2019-03-05 NOTE — Progress Notes (Signed)
Occupational Therapy Session Note  Patient Details  Name: Joel Rogers MRN: [id number] Date of Birth: [birth year]  Today's Date: 03/05/2019 OT Individual Time: 5053-9767 OT Individual Time Calculation (min): 56 min   Skilled Therapeutic Interventions/Progress Updates:    Pt greeted for initial tx, however had not received breakfast and adamant that he would not participate in therapy until after he ate. OT arrived at later time of day to see him, and pt agreeable at this time. Supine<sit with supervision and HOB elevated. Pt threaded LEs into pants while using reacher. Total A for compression socks + sneakers. Mod A sit<stand from elevated bed using RW with cues provided for rocking technique and anterior weight shifting. Steady assist for balance, though pt with heavy UE reliance to maintain standing. OT elevated pants over hips, and pt reported he needed to sit down after about 20 seconds. Slideboard<w/c completed with Min A for board placement and LE positioning while scooting. Mod A for reciprocal scooting of hips back in w/c. While seated, he completed oral care and grooming tasks w/c level at sink. Vcs provided for increasing functional use of Rt, especially when reaching for needed items. Continued d/c planning with education emphasis placed on pt keeping an open mind when discussing d/c recommendations with CIR team. Pt left in w/c with all needs within reach, safety belt fastened, and half lap tray.   Therapy Documentation Precautions:  Precautions Precautions: Fall Restrictions Weight Bearing Restrictions: No Pain: R LE with movement at times, subsides with rest. Pt attributes pain from rocking great-granddaughter in the past.    ADL: ADL Upper Body Bathing: Maximal assistance Where Assessed-Upper Body Bathing: Bed level Lower Body Bathing: Dependent Where Assessed-Lower Body Bathing: Bed level Upper Body Dressing: Maximal assistance Where Assessed-Upper Body Dressing: Bed  level Lower Body Dressing: Dependent Where Assessed-Lower Body Dressing: Bed level     Therapy/Group: Individual Therapy  Heer Justiss A Quintasia Theroux 03/05/2019, 11:53 AM

## 2019-03-05 NOTE — Progress Notes (Signed)
Physical Therapy Session Note  Patient Details  Name: Joel Rogers MRN: [id number] Date of Birth: [birth year]  Today's Date: 03/05/2019 PT Individual Time: 1330-1445 PT Individual Time Calculation (min): 75 min   Short Term Goals: Week 3:  PT Short Term Goal 1 (Week 3): Pt will perform bed<>WC transfer with min A consistantly  PT Short Term Goal 2 (Week 3): Pt will propell WC 139f with supevision assist  PT Short Term Goal 3 (Week 3): Pt will ambulate 573fwith LRAD and max A + 2  Skilled Therapeutic Interventions/Progress Updates: Pt presented in w/c agreeable to therapy. Pt transported to rehab gym for energy conservation. Pt declined use of Maxi Sky for wt shifting. Performed STS in parallel bars x 4 requiring modA fading to maxA with increased fatigue. Pt required max multimodal cues for increasing anterior wt shifting encouraged pt to perform "nose over toes" and tactile cues once in standing for anterior translation of hips. Multimodal cues for increasing erect posture and performed lateral wt shifting. Pt was able to tolerate standing for periods up to 1 min before fatigue. Pt did require increased time for recovery after each stand. Pt then transported to day room and performed Cybex Kinetron 80cm/sec then 70cm/sec 30 cycles x 3. Pt propelled 7543fith supervision and was able to maneuver around obstacle in hall and turn into room with increased time. Pt remained in w/c at end of session and left with belt alarm on, call bell within reach and needs met.      Therapy Documentation Precautions:  Precautions Precautions: Fall Restrictions Weight Bearing Restrictions: No General:   Vital Signs: Therapy Vitals Temp: 98.3 F (36.8 C) Temp Source: Oral Pulse Rate: (!) 57 Resp: 20 BP: 112/72 Patient Position (if appropriate): Sitting Oxygen Therapy SpO2: 100 % O2 Device: Room Air Pain: Pain Assessment Pain Scale: 0-10 Pain Score: 0-No pain    Therapy/Group: Individual  Therapy  Madalen Gavin  Demiana Crumbley, PTA  03/05/2019, 3:58 PM

## 2019-03-05 NOTE — Progress Notes (Signed)
[facility] PHYSICAL MEDICINE & REHABILITATION PROGRESS NOTE   Subjective/Complaints:  Refused blood draw this am, discussed rationale  ROS: Denies CP, SOB, N/V/D  Objective:   No results found. No results for input(s): WBC, HGB, HCT, PLT in the last 72 hours. No results for input(s): NA, K, CL, CO2, GLUCOSE, BUN, CREATININE, CALCIUM in the last 72 hours.  Intake/Output Summary (Last 24 hours) at 03/05/2019 0908 Last data filed at 03/05/2019 0839 Gross per 24 hour  Intake 600 ml  Output 2800 ml  Net -2200 ml     Physical Exam: Vital Signs Blood pressure 125/75, pulse 73, temperature 98.2 F (36.8 C), temperature source Oral, resp. rate 17, height 6\' 1"  (1.854 m), weight 108.3 kg, SpO2 100 %. Constitutional: NAD.  Vital signs reviewed.Marland Kitchen. HENT: Normocephalic.  Atraumatic. Eyes: EOMI.  No discharge. Cardiovascular: No JVD. Respiratory: Normal effort. GI: Non-distended. Musc: Right-sided edema with RUE with kinesiotaping, right knee without pain to palpation.  There is no tenderness along the patella tendon there is no evidence of medial or lateral joint line tenderness no pain with range of motion.  No evidence of effusion. Neurologic: Alert and oriented Motor:  RUE: 3+/5 proximal distal, stable RLE: 2/5 proximal to distal, stable, LLE 3- Skin: No evidence of breakdown, no evidence of rash Psych: Normal mood.  Normal behavior.   Assessment/Plan: 1. Functional deficits secondary to cardioembolic bicerebral infarcts which require 3+ hours per day of interdisciplinary therapy in a comprehensive inpatient rehab setting.  Physiatrist is providing close team supervision and 24 hour management of active medical problems listed below.  Physiatrist and rehab team continue to assess barriers to discharge/monitor patient progress toward functional and medical goals  Care Tool:  Bathing    Body parts bathed by patient: Chest, Abdomen, Left upper leg, Face, Right upper leg   Body  parts bathed by helper: Front perineal area, Buttocks     Bathing assist Assist Level: 2 Helpers(bed level for perineal hygiene)     Upper Body Dressing/Undressing Upper body dressing Upper body dressing/undressing activity did not occur (including orthotics): Refused What is the patient wearing?: Pull over shirt    Upper body assist Assist Level: Moderate Assistance - Patient 50 - 74%    Lower Body Dressing/Undressing Lower body dressing      What is the patient wearing?: Pants(reacher, sit<stand from elevated bed using device)     Lower body assist Assist for lower body dressing: Moderate Assistance - Patient 50 - 74%     Toileting Toileting Toileting Activity did not occur Press photographer(Clothing management and hygiene only): Refused  Toileting assist Assist for toileting: 2 Helpers Assistive Device Comment: BSC   Transfers Chair/bed transfer  Transfers assist  Chair/bed transfer activity did not occur: Safety/medical concerns  Chair/bed transfer assist level: Moderate Assistance - Patient 50 - 74%     Locomotion Ambulation   Ambulation assist   Ambulation activity did not occur: Safety/medical concerns  Assist level: Dependent - Patient 0%(maxi sky walking sling ) Assistive device: Maxi Sky Max distance: 603ft    Walk 10 feet activity   Assist  Walk 10 feet activity did not occur: Safety/medical concerns        Walk 50 feet activity   Assist Walk 50 feet with 2 turns activity did not occur: Safety/medical concerns         Walk 150 feet activity   Assist Walk 150 feet activity did not occur: Safety/medical concerns         Walk  10 feet on uneven surface  activity   Assist Walk 10 feet on uneven surfaces activity did not occur: Safety/medical concerns         Wheelchair     Assist Will patient use wheelchair at discharge?: Yes Type of Wheelchair: Manual    Wheelchair assist level: Contact Guard/Touching assist Max wheelchair distance:  13ft    Wheelchair 50 feet with 2 turns activity    Assist    Wheelchair 50 feet with 2 turns activity did not occur: Safety/medical concerns(limited by fatigue)   Assist Level: Contact Guard/Touching assist   Wheelchair 150 feet activity     Assist Wheelchair 150 feet activity did not occur: Safety/medical concerns          Medical Problem List and Plan: 1.Persistent right side weakness with decreased mobilitysecondary to cardioembolic bi-cerebral infarction  Continue CIR, PT, OT, speech,  We discussed that he still requires max A for slid eboard transfer 2. Antithrombotics: -DVT/anticoagulation:ELIQUIS -antiplatelet therapy:  3. Pain Management:Ultracet as needed, knee pain with weightbearing may be meniscal certainly no evidence of effusion 4. Mood:Provide emotional support  -appreciate neuropsych input re: coping with episodic stress/anxiety -antipsychotic agents: none 5. Neuropsych: This patientiscapable of making decisions on hisown behalf. 6. Skin/Wound Care:Routine skin checks 7. Fluids/Electrolytes/Nutrition:encourage PO 8. ABLA/ Upper GI bleed EGD 3. Follow-up GI services. Continue PPI -serial H/H's   -hgb 7.9 on 4/3  -1u PRBC Wednesday- 4/1    Labs refused repeat 4/13 10. Chronic atrial fibrillation with TAVR procedure September 2019 11. Non-STEMI. Thought secondary to thrombus status post cardiac catheterization. Continue eliquis 12. Acute on chronic diastolic congestive heart failure. Lasix 40 mg daily  Filed Weights   03/03/19 0426 03/04/19 0419 03/05/19 0418  Weight: 107.5 kg 108.2 kg 108.3 kg   Stable on 4/13 13. Hypertension. Norvasc 5 mg daily, Lopressor 50 mg twice a day. Monitor with increased mobility Vitals:   03/05/19 0418 03/05/19 0752  BP: 98/62 125/75  Pulse: 70 73  Resp: 17   Temp: 98.2 F (36.8 C)   SpO2: 100% 100%   controlled on 4/13 14. Hyperlipidemia. Lipitor 15.  Persistent leukocytosis:  WBCs 12.9 on 4/3, improving  Afebrile   16.  Hypokalemia  Potassium 4.1 on 4/3 after supplementation  Labs refused will reorder later in week  LOS: 21 days A FACE TO FACE EVALUATION WAS PERFORMED  Erick Colace 03/05/2019, 9:08 AM

## 2019-03-05 NOTE — Progress Notes (Signed)
Patient refuses blood draw, educated but continue to refuse, he said he is not sick.

## 2019-03-05 NOTE — Progress Notes (Signed)
Speech Language Pathology Daily Session Note  Patient Details  Name: Joel Rogers MRN: [id number] Date of Birth: June 11, 1951  Today's Date: 03/05/2019 SLP Individual Time: 0930-1030 SLP Individual Time Calculation (min): 60 min  Short Term Goals: Week 3: SLP Short Term Goal 1 (Week 3): Pt will complete semi-complex tasks with Min A verbal cues for functional problem solving.  SLP Short Term Goal 2 (Week 3): Pt will demonstrate selective attention for ~ 30 minutes in moderately distracting environment with Min A cues.  SLP Short Term Goal 3 (Week 3): Pt will self-monitor and self-correct errors in 7 out of 10 opportunities with Mod A cues.  SLP Short Term Goal 4 (Week 3): Pt will monitor participation in skilled interdisciplinary therapy sessions by completing calendar of participation in therapies.  SLP Short Term Goal 5 (Week 3): With Min A question cues, pt will recall compensatory memory strategies in 5 out of 10 opportunities.   Skilled Therapeutic Interventions:  Skilled treatment session focused on cognition goals. SLP facilitated session by reviewing "participation calendar" however it was not filled out. Chart review indicates that pt has been participating in more sessions and calendar updated accordingly. Pt continues to demonstrate decreased insight into deficits (medical and physical). He is angry that a hospital bed was recommended by therapy and also very resistant to having blood work drawn. Extensive education provided but pt doesn't demonstrate any increased insight. SLP further facilitated session by providing deductive scheduling tasks with Min A cues required to facilitate accuracy. Pt left upright in bed, bed alarm on and all needs within reach. Continue per current plan of care.      Pain Pain Assessment Pain Scale: 0-10 Pain Score: 0-No pain  Therapy/Group: Individual Therapy  Naylea Wigington 03/05/2019, 12:50 PM

## 2019-03-06 ENCOUNTER — Inpatient Hospital Stay (HOSPITAL_COMMUNITY): Payer: Self-pay | Admitting: Occupational Therapy

## 2019-03-06 ENCOUNTER — Inpatient Hospital Stay (HOSPITAL_COMMUNITY): Payer: Self-pay | Admitting: Speech Pathology

## 2019-03-06 NOTE — Progress Notes (Signed)
Occupational Therapy Session Note  Patient Details  Name: Joel Rogers MRN: [id number] Date of Birth: 03-Jun-1951  Today's Date: 03/06/2019 OT Individual Time: 0704-0800 OT Individual Time Calculation (min): 56 min    Short Term Goals: Week 3:  OT Short Term Goal 1 (Week 3): Pt will complete BSC transfer using slideboard with Max A    OT Short Term Goal 2 (Week 3): Pt will complete lateral leans with Mod A during toileting OT Short Term Goal 3 (Week 3): Pt will elevate pants over hips 25% of the way, utilizing lateral leans EOB  Skilled Therapeutic Interventions/Progress Updates:    Upon entering the room, pt supine in bed with no c/o pain but reports " I just don't feel like doing this. My head is not into this." OT provided maximal encouragement for pt participation. Pt performing supine >sit with mod A for trunk and R LE. Pt sitting with close supervision for balance. Pt needing assistance to thread pants onto B LEs and able to lean himself L <> R and returning to midline without assistance while therapist pulling pants over B hips.Slide board transfer to the R with min A and increased time to complete. Pt donning pull over shirt with increased time and set up A only. Pt performed grooming tasks at wink while seated in wheelchair with supervision/set up A. Pt remaining in wheelchair with call bell and all needed items within reach upon exiting the room.   Therapy Documentation Precautions:  Precautions Precautions: Fall Restrictions Weight Bearing Restrictions: No General:   Vital Signs:  Pain: Pain Assessment Pain Scale: 0-10 Pain Score: 0-No pain ADL: ADL Upper Body Bathing: Maximal assistance Where Assessed-Upper Body Bathing: Bed level Lower Body Bathing: Dependent Where Assessed-Lower Body Bathing: Bed level Upper Body Dressing: Maximal assistance Where Assessed-Upper Body Dressing: Bed level Lower Body Dressing: Dependent Where Assessed-Lower Body Dressing: Bed  level   Therapy/Group: Individual Therapy  [name] 03/06/2019, 10:21 AM

## 2019-03-06 NOTE — Progress Notes (Signed)
Speech Language Pathology Daily Session Note  Patient Details  Name: Joel Rogers MRN: [id number] Date of Birth: [birth year]  Today's Date: 03/06/2019 SLP Individual Time: 1115-1200 SLP Individual Time Calculation (min): 45 min  SLP Individual Time: 1345-1430 SLP Individual Time Calculation (min): 45 min   Short Term Goals: Week 3: SLP Short Term Goal 1 (Week 3): Pt will complete semi-complex tasks with Min A verbal cues for functional problem solving.  SLP Short Term Goal 2 (Week 3): Pt will demonstrate selective attention for ~ 30 minutes in moderately distracting environment with Min A cues.  SLP Short Term Goal 3 (Week 3): Pt will self-monitor and self-correct errors in 7 out of 10 opportunities with Mod A cues.  SLP Short Term Goal 4 (Week 3): Pt will monitor participation in skilled interdisciplinary therapy sessions by completing calendar of participation in therapies.  SLP Short Term Goal 5 (Week 3): With Min A question cues, pt will recall compensatory memory strategies in 5 out of 10 opportunities.   Skilled Therapeutic Interventions: Pt was seen for skilled ST intervention targeting aforementioned goals for improved cognition.    Session 1: Pt was seen this morning to establish rapport and discuss pt current work in therapies. Pt was pleasant and cooperative with unfamiliar therapist. Pt reports having a doctorate of divinity, following a double major in religion and philosophy. He indicates he was very active prior to hospitalization, and was independent with all activities including med administration and management of finances. Pt reports he is currently focused on strengthening his right leg, and decreasing his fear of falling.   Session 2: Pt was seen this afternoon to complete a multiprocess reasoning task (deductive puzzle #1). Pt was able to fill in squares with clear cues, but required mod verbal and visual cues to complete the matrix accurately when using higher level  problem solving and reasoning. He was able to write some answers in the correct place with his right hand with fair legibility.  Pain Pain Assessment Pain Scale: 0-10 Pain Score: 0-No pain  Therapy/Group: Individual Therapy  Kazuma Elena B. Murvin Natal, [facility], CCC-SLP Speech Language Pathologist  Leigh Aurora 03/06/2019, 4:20 PM

## 2019-03-06 NOTE — Progress Notes (Signed)
Joel Rogers PHYSICAL MEDICINE & REHABILITATION PROGRESS NOTE   Subjective/Complaints:  Pt without new issues, "tired of being in hospital"  ROS: Denies CP, SOB, N/V/D  Objective:   No results found. No results for input(s): WBC, HGB, HCT, PLT in the last 72 hours. No results for input(s): NA, K, CL, CO2, GLUCOSE, BUN, CREATININE, CALCIUM in the last 72 hours.  Intake/Output Summary (Last 24 hours) at 03/06/2019 0824 Last data filed at 03/06/2019 0700 Gross per 24 hour  Intake 480 ml  Output 3125 ml  Net -2645 ml     Physical Exam: Vital Signs Blood pressure 114/82, pulse (!) 56, temperature 98 F (36.7 C), temperature source Oral, resp. rate 17, height 6\' 1"  (1.854 m), weight 108.2 kg, SpO2 100 %. Constitutional: NAD.  Vital signs reviewed.Marland Kitchen HENT: Normocephalic.  Atraumatic. Eyes: EOMI.  No discharge. Cardiovascular: No JVD. Respiratory: Normal effort. GI: Non-distended. Musc: Right-sided edema with RUE with kinesiotaping, right knee without pain to palpation.  There is no tenderness along the patella tendon there is no evidence of medial or lateral joint line tenderness no pain with range of motion.  No evidence of effusion. Neurologic: Alert and oriented Motor:  RUE: 3+/5 proximal distal, stable RLE: 2/5 proximal to distal, stable, LLE 3- Skin: No evidence of breakdown, no evidence of rash Psych: Normal mood.  Normal behavior.   Assessment/Plan: 1. Functional deficits secondary to cardioembolic bicerebral infarcts which require 3+ hours per day of interdisciplinary therapy in a comprehensive inpatient rehab setting.  Physiatrist is providing close team supervision and 24 hour management of active medical problems listed below.  Physiatrist and rehab team continue to assess barriers to discharge/monitor patient progress toward functional and medical goals  Care Tool:  Bathing    Body parts bathed by patient: Chest, Abdomen, Left upper leg, Face, Right upper leg   Body parts bathed by helper: Front perineal area, Buttocks     Bathing assist Assist Level: 2 Helpers(bed level for perineal hygiene)     Upper Body Dressing/Undressing Upper body dressing Upper body dressing/undressing activity did not occur (including orthotics): Refused What is the patient wearing?: Pull over shirt    Upper body assist Assist Level: Moderate Assistance - Patient 50 - 74%    Lower Body Dressing/Undressing Lower body dressing      What is the patient wearing?: Pants     Lower body assist Assist for lower body dressing: Moderate Assistance - Patient 50 - 74%     Toileting Toileting Toileting Activity did not occur Press photographer and hygiene only): Refused  Toileting assist Assist for toileting: 2 Helpers Assistive Device Comment: BSC   Transfers Chair/bed transfer  Transfers assist  Chair/bed transfer activity did not occur: Safety/medical concerns  Chair/bed transfer assist level: Moderate Assistance - Patient 50 - 74%     Locomotion Ambulation   Ambulation assist   Ambulation activity did not occur: Safety/medical concerns  Assist level: Dependent - Patient 0%(maxi sky walking sling ) Assistive device: Maxi Sky Max distance: 68ft    Walk 10 feet activity   Assist  Walk 10 feet activity did not occur: Safety/medical concerns        Walk 50 feet activity   Assist Walk 50 feet with 2 turns activity did not occur: Safety/medical concerns         Walk 150 feet activity   Assist Walk 150 feet activity did not occur: Safety/medical concerns         Walk 10 feet on uneven  surface  activity   Assist Walk 10 feet on uneven surfaces activity did not occur: Safety/medical concerns         Wheelchair     Assist Will patient use wheelchair at discharge?: Yes Type of Wheelchair: Manual    Wheelchair assist level: Contact Guard/Touching assist Max wheelchair distance: 18100ft    Wheelchair 50 feet with 2 turns  activity    Assist    Wheelchair 50 feet with 2 turns activity did not occur: Safety/medical concerns(limited by fatigue)   Assist Level: Contact Guard/Touching assist   Wheelchair 150 feet activity     Assist Wheelchair 150 feet activity did not occur: Safety/medical concerns          Medical Problem List and Plan: 1.Persistent right side weakness with decreased mobilitysecondary to cardioembolic bi-cerebral infarction  Continue CIR, PT, OT, speech,  Team conf in am We discussed that he still requires max A for slid eboard transfer 2. Antithrombotics: -DVT/anticoagulation:ELIQUIS -antiplatelet therapy:  3. Pain Management:Ultracet as needed, knee pain with weightbearing may be meniscal certainly no evidence of effusion 4. Mood:Provide emotional support  -appreciate neuropsych input re: coping with episodic stress/anxiety -antipsychotic agents: none 5. Neuropsych: This patientiscapable of making decisions on hisown behalf. 6. Skin/Wound Care:Routine skin checks 7. Fluids/Electrolytes/Nutrition:encourage PO 8. ABLA/ Upper GI bleed EGD 3. Follow-up GI services. Continue PPI -serial H/H's   -hgb 7.9 on 4/3  -1u PRBC Wednesday- 4/1    Labs refused repeat 4/13 10. Chronic atrial fibrillation with TAVR procedure September 2019 11. Non-STEMI. Thought secondary to thrombus status post cardiac catheterization. Continue eliquis 12. Acute on chronic diastolic congestive heart failure. Lasix 40 mg daily  Filed Weights   03/04/19 0419 03/05/19 0418 03/06/19 0508  Weight: 108.2 kg 108.3 kg 108.2 kg   Stable on 4/14 13. Hypertension. Norvasc 5 mg daily, Lopressor 50 mg twice a day. Monitor with increased mobility Vitals:   03/05/19 1955 03/06/19 0508  BP: 113/68 114/82  Pulse: 68 (!) 56  Resp: 20 17  Temp: 97.8 F (36.6 C) 98 F (36.7 C)  SpO2: 100% 100%   controlled on 4/14 14. Hyperlipidemia. Lipitor 15.  Persistent leukocytosis:  WBCs 12.9 on 4/3, improving  Afebrile   16.  Hypokalemia  Potassium 4.1 on 4/3 after supplementation  Labs refused will reorder later in week  LOS: 22 days A FACE TO FACE EVALUATION WAS PERFORMED  Joel Rogers E [name] 03/06/2019, 8:24 AM

## 2019-03-06 NOTE — Progress Notes (Signed)
Physical Therapy Session Note  Patient Details  Name: Joel Rogers MRN: [id number] Date of Birth: 12-25-1950  Today's Date: 03/06/2019 PT Individual Time: 0900-1000 PT Individual Time Calculation (min): 60 min   Short Term Goals: Week 3:  PT Short Term Goal 1 (Week 3): Pt will perform bed<>WC transfer with min A consistantly  PT Short Term Goal 2 (Week 3): Pt will propell WC 130f with supevision assist  PT Short Term Goal 3 (Week 3): Pt will ambulate 565fwith LRAD and max A + 2  Skilled Therapeutic Interventions/Progress Updates: Pt presented in w/c agreeable to therapy. Session focused on functional transfers and standing tolerance. Pt transported to high/low mat in day room for energy conservation. Performed SB transfer min/modA with increased time and moderate verbal cues for increasing forward lean and head/hips relationship. Pt required modA due to pant leg being caught on edge of SB. Once pant leg cleared pt was able to complete transfer with minA fading to CGAddisPt performed STS from elevated mat x 3 requiring minA x 2. Verbal cues for rocking to increase momentum and forward lean. Once in standing pt was able to maintain static stand for up to 30 sec. Pt performed stand pivot transfer x 2 to standard chair then recliner chair. Pt c/o of R hip pain with wt bearing however subsided with rest. Pt required increased time for LLE foot clearance and verbal cues for safe sequencing with RW. Pt with decreased eccentric control during descent into chair. Pt required increased time between activities for recovery however pt seemed encouraged by effort. Pt propelled back to room using BUE with increased time and was noted to have improvement in negotiating turns this session. Pt remained in w/c at end of session and left with belt alarm on, call bell within reach and needs met.       Therapy Documentation Precautions:  Precautions Precautions: Fall Restrictions Weight Bearing Restrictions:  No    Therapy/Group: Individual Therapy  Joel Rogers  Joel Rogers, PTA  03/06/2019, 1:12 PM

## 2019-03-07 ENCOUNTER — Inpatient Hospital Stay (HOSPITAL_COMMUNITY): Payer: Self-pay | Admitting: Speech Pathology

## 2019-03-07 ENCOUNTER — Inpatient Hospital Stay (HOSPITAL_COMMUNITY): Payer: Self-pay | Admitting: Occupational Therapy

## 2019-03-07 ENCOUNTER — Inpatient Hospital Stay (HOSPITAL_COMMUNITY): Payer: Self-pay | Admitting: *Deleted

## 2019-03-07 NOTE — Progress Notes (Signed)
Physical Therapy Weekly Progress Note  Patient Details  Name: Joel Rogers MRN: [id number] Date of Birth: Mar 18, 1951  Beginning of progress report period: February 27, 2019 End of progress report period: March 07, 2019  Today's Date: 03/07/2019   Patient has met 2 of 3 short term goals.  Pt has demonstrated improvement in tolerance to therapy this past week. Pt continues to require use of bed features however with increased time performs consistently with minA. Pt's performance with transfers has varied due to pt's fatigue level and level of participation has continues to fluctuate. Pt can be successful minA for SB transfers with increased time and has initiated stand pivot transfers with min to modA with increased time. Due to pt's increased anxiety and pain in RLE pt has been unable to successfully and consistently participate in gait training.   Patient continues to demonstrate the following deficits muscle weakness, decreased cardiorespiratoy endurance and impaired timing and sequencing, unbalanced muscle activation and decreased coordination and therefore will continue to benefit from skilled PT intervention to increase functional independence with mobility.  Patient progressing toward long term goals..  Continue plan of care.  PT Short Term Goals Week 3:  PT Short Term Goal 1 (Week 3): Pt will perform bed<>WC transfer with min A consistantly  PT Short Term Goal 1 - Progress (Week 3): Met PT Short Term Goal 2 (Week 3): Pt will propell WC 128f with supevision assist  PT Short Term Goal 2 - Progress (Week 3): Met PT Short Term Goal 3 (Week 3): Pt will ambulate 578fwith LRAD and max A + 2 PT Short Term Goal 3 - Progress (Week 3): Not met Week 4:  PT Short Term Goal 1 (Week 4): STG=LTG due to ELOS  Skilled Therapeutic Interventions/Progress Updates:  Ambulation/gait training;Community reintegration;DME/adaptive equipment instruction;Neuromuscular re-education;Psychosocial support;Stair  training;UE/LE Strength taining/ROM;Wheelchair propulsion/positioning;Balance/vestibular training;Discharge planning;Functional electrical stimulation;Pain management;Skin care/wound management;Therapeutic Activities;UE/LE Coordination activities;Cognitive remediation/compensation;Disease management/prevention;Functional mobility training;Patient/family education;Splinting/orthotics;Therapeutic Exercise   Therapy Documentation Precautions:  Precautions Precautions: Fall Restrictions Weight Bearing Restrictions: No   Pain: Pain Assessment Pain Scale: 0-10 Pain Score: 0-No pain V  Therapy/Group: Individual Therapy  [name] 03/07/2019, 1:01 PM

## 2019-03-07 NOTE — Patient Care Conference (Addendum)
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 Hypokalemia   . Leukocytosis   . Uncontrolled stage 2 hypertension   . Acute on chronic diastolic (congestive) heart failure (HCC)   . Acute blood loss anemia   . Posterior circulation stroke (HCC) 02/12/2019  . Cerebral embolism with cerebral infarction 02/09/2019  . Malnutrition of moderate degree 02/08/2019  . Pressure injury of skin 02/07/2019  . VT (ventricular tachycardia) (HCC)   . Acute coronary syndrome (HCC) 02/03/2019  . VF (ventricular fibrillation) (HCC) causing cardiac arrest 02/03/2019  . Acute bilateral cerebral infarction in a watershed distribution [facility](HCC) 01/30/2019  . UGIB (upper gastrointestinal bleed) 01/26/2019  . Bacteremia due to Streptococcus 01/26/2019  . Cellulitis 01/26/2019  . Upper GI bleed   . Acute CVA (cerebrovascular accident) (HCC) 01/19/2019  . Acute renal failure (HCC) 01/14/2019  . Right shoulder pain 01/14/2019  . Sepsis (HCC) 01/13/2019  . S/P TAVR (transcatheter aortic valve replacement) 07/25/2018  . History of subdural hematoma   . Atrial fibrillation, chronic   . Acute on chronic diastolic heart failure (HCC) 06/15/2018  . Pulmonary hypertension, unspecified (HCC) 02/15/2018  . Varicose veins of right lower extremity with complications 01/10/2018  . History of Severe aortic stenosis  08/09/2017  . Vitamin D deficiency 12/02/2016  . Status post gastric bypass for obesity 11/02/2016  . HTN (hypertension) 04/13/2013  . Hyperlipemia 04/13/2013  . Chronic atrial fibrillation 02/14/2013  . Non-ST elevation (NSTEMI) myocardial infarction (HCC) 06/25/2008  . Morbid obesity (HCC) 06/18/2008  . Obstructive sleep apnea 06/18/2008    Expected Discharge Date: Expected Discharge Date: 2019/04/05  Team Members Present: Physician leading conference: Dr. Claudette LawsAndrew Kirsteins Social Worker Present: Dossie DerBecky Teran Knittle, LCSW Nurse Present: Gerre CouchKayla Mabe, LPN PT Present: Grier RocherAustin Tucker, PT;[name], PTA OT Present: Jackquline DenmarkKatie Bradsher, OT SLP Present: Reuel DerbyHappi Overton, SLP PPS Coordinator present : Fae PippinMelissa Bowie     Current Status/Progress Goal Weekly Team Focus  Medical   mood varies day to day , refused blood draws  maintain medical stability  try to get pt to allow blood draw for CBC   Bowel/Bladder   continent of bowel and bladder  remain continent  assess q shift   Swallow/Nutrition/ Hydration             ADL's   Min A slideboard transfers, Max A LB self care, Mod A UB self care, continues to be self limiting   Min assist bathing/dressing and transfers, supervision grooming and sitting balance( will likely downgrade this week)  Adaptive bathing/dressing skills, Rt NMR, functional transfers, pt/family education + d/c planning    Mobility   min-modA varying 1-2 person SB transfer, modA STS from parallel bars, minA from elevated mat, minA stand pivot transfer with RW, supervision with increased time w/c mobility  currently set for Min assist bed mobility, transfers, and gait. (may need to be downgraded)  transfers, standing tolerance, w/c mobility, endurance    Communication             Safety/Cognition/ Behavioral Observations  Mod A to Min A for semi-complex cognition  Min A for semi-complex cognition  semi-complex problem solving, selective attention, emergent awareness   Pain   no  c/o pain  remain pain free  assess q shift   Skin   healing sacrum wound, BLE +2 pitting edema, no redness   no new skin breakdown  assess q shift      *See Care Plan and progress notes for long and short-term goals.     Barriers to Discharge  Current Status/Progress Possible Resolutions Date Resolved   Physician    Medication compliance     slow progress  see above      Nursing                  PT                    OT                  SLP Behavior decreased participation in sessions            SW                Discharge Planning/Teaching Needs:  Wife plans on coming in friday or Monday to see him in therpaies and begin learnng his care. Pt very much wants to go home sooner, but wife wants him tyo stay as long as therapy team wants him here.      Team Discussion:  Progressing toward his goals, self limiting due to wants to go home. Has refused labs due to doesn't want discharge date delayed. Decreased endurance getting better. Functioning at min to mod level with sliding board. Skin healing on bottom. Wife coming in for education on Friday may need to come back on Monday.  Revisions to Treatment Plan:  DC 4/21    Continued Need for Acute Rehabilitation Level of Care: The patient requires daily medical management by a physician with specialized training in physical medicine and rehabilitation for the following conditions: Daily direction of a multidisciplinary physical rehabilitation program to ensure safe treatment while eliciting the highest outcome that is of practical value to the patient.: Yes Daily medical management of patient stability for increased activity during participation in an intensive rehabilitation regime.: Yes Daily analysis of laboratory values and/or radiology reports with any subsequent need for medication adjustment of medical intervention for : Neurological problems;Other   I attest that I was present, lead the team conference, and concur with the  assessment and plan of the team. Teleconference held due to COVID-19   Tequilla Cousineau, Lemar Livings 03/07/2019, 1:01 PM

## 2019-03-07 NOTE — Progress Notes (Signed)
Speech Language Pathology Weekly Progress and Session Note  Patient Details  Name: Joel Rogers MRN: [id number] Date of Birth: Nov 10, 1951  Beginning of progress report period: March 01, 2019 End of progress report period: March 07, 2019  Today's Date: 03/07/2019 SLP Individual Time: 0730-0800 SLP Individual Time Calculation (min): 30 min  Short Term Goals: Week 3: SLP Short Term Goal 1 (Week 3): Pt will complete semi-complex tasks with Min A verbal cues for functional problem solving.  SLP Short Term Goal 1 - Progress (Week 3): Progressing toward goal SLP Short Term Goal 2 (Week 3): Pt will demonstrate selective attention for ~ 30 minutes in moderately distracting environment with Min A cues.  SLP Short Term Goal 2 - Progress (Week 3): Progressing toward goal SLP Short Term Goal 3 (Week 3): Pt will self-monitor and self-correct errors in 7 out of 10 opportunities with Mod A cues.  SLP Short Term Goal 3 - Progress (Week 3): Met SLP Short Term Goal 4 (Week 3): Pt will monitor participation in skilled interdisciplinary therapy sessions by completing calendar of participation in therapies.  SLP Short Term Goal 4 - Progress (Week 3): Not met SLP Short Term Goal 5 (Week 3): With Min A question cues, pt will recall compensatory memory strategies in 5 out of 10 opportunities.  SLP Short Term Goal 5 - Progress (Week 3): Progressing toward goal    New Short Term Goals: Week 4: SLP Short Term Goal 1 (Week 4): Pt will complete semi-complex tasks with Min A verbal cues for functional problem solving.  SLP Short Term Goal 2 (Week 4): Pt will demonstrate selective attention for ~ 30 minutes in moderately distracting environment with Min A cues.  SLP Short Term Goal 3 (Week 4): Pt will self-monitor and self-correct errors in 7 out of 10 opportunities with Min A cues.  SLP Short Term Goal 4 (Week 4): With Min A question cues, pt will recall compensatory memory strategies in 5 out of 10 opportunities.    Weekly Progress Updates:    Pt continues to make minimal slow towards goals but little overall functional progress. Pt continues to demonstrate poor insight into overall deficits and is frequently reluctant to participate in therapies. Family education is scheduled with pt's wife d/t inability to ineffectively increase cognitive and physical abilities beyond Mod A to Min A.    Intensity: Minumum of 1-2 x/day, 30 to 90 minutes Frequency: 3 to 5 out of 7 days Duration/Length of Stay: 4/21 Treatment/Interventions: Cognitive remediation/compensation;Medication managment;Functional tasks;Internal/external aids;Environmental controls   Daily Session  Skilled Therapeutic Interventions: Skilled treatment session focused on cognition goals. SLP facilitated session by providing Min A cues to count money and Mod A cues to solve money problems related to making change. Pt is aware of inability and attempts to self-correct but is unable to provide correct amount. Pt left upright in bed, bed alarm on and all needs within reach. Continue per current plan of care.     General    Pain Pain Assessment Pain Scale: 0-10 Pain Score: 0-No pain  Therapy/Group: Individual Therapy  Maryam Feely 03/07/2019, 11:30 AM

## 2019-03-07 NOTE — Progress Notes (Addendum)
Physical Therapy Session Note  Patient Details  Name: Joel Rogers MRN: [id number] Date of Birth: 01/06/1951  Today's Date: 03/07/2019 PT Individual Time: 0915-1030 PT Individual Time Calculation (min): 75 min   Short Term Goals: Week 3:  PT Short Term Goal 1 (Week 3): Pt will perform bed<>WC transfer with min A consistantly  PT Short Term Goal 2 (Week 3): Pt will propell WC 120ft with supevision assist  PT Short Term Goal 3 (Week 3): Pt will ambulate 5ft with LRAD and max A + 2  Skilled Therapeutic Interventions/Progress Updates: Pt presented in bed agreeable to therapy. Pt performed supine to sit minA with use of bed features and increased time. PTA donned compression socks, threaded shoes, and donned socks. Pt performed STS from elevated bed minA x 2 with PTA pulling up pants over brief. Pt then performed stand pivot to w/c with minA and increased time with cues for erect posture and decreasing posterior lean. Pt then transported to rehab gym and performed STS from w/c x 3 with modA x 2 each requiring multiple attempts due to pt's increased anxiety. Pt was able to clear buttocks x 3 however was unable to come to full standing as pt refused to let go of w/c with LUE. Pt required cues for scooting forward in w/c to facilitate transfer and PTA providing tactile cues for increasing anterior wt shifting when initiating transfer. Pt required extended time between bouts due to decreased endurance. Pt requesting to change Kinesotape on RUE, PTA able to change x 1 adv to ask next therapist to replace other tape due to limited time. Pt transported back to room and remained in w/c with call bell within reach and needs met.      Therapy Documentation Precautions:  Precautions Precautions: Fall Restrictions Weight Bearing Restrictions: No General:   Vital Signs:    Therapy/Group: Individual Therapy  [name]  [name], PTA  03/07/2019, 12:56 PM  

## 2019-03-07 NOTE — Progress Notes (Signed)
[facility] PHYSICAL MEDICINE & REHABILITATION PROGRESS NOTE   Subjective/Complaints:  More enthusiastic today.  Participating well with PT  ROS: Denies CP, SOB, N/V/D  Objective:   No results found. No results for input(s): WBC, HGB, HCT, PLT in the last 72 hours. No results for input(s): NA, K, CL, CO2, GLUCOSE, BUN, CREATININE, CALCIUM in the last 72 hours.  Intake/Output Summary (Last 24 hours) at 03/07/2019 1136 Last data filed at 03/07/2019 1045 Gross per 24 hour  Intake 720 ml  Output 2050 ml  Net -1330 ml     Physical Exam: Vital Signs Blood pressure 119/81, pulse 60, temperature 98.1 F (36.7 C), resp. rate 17, height '6\' 1"'$  (1.854 m), weight 108.2 kg, SpO2 100 %. Constitutional: NAD.  Vital signs reviewed.Marland Kitchen HENT: Normocephalic.  Atraumatic. Eyes: EOMI.  No discharge. Cardiovascular: No JVD. Respiratory: Normal effort. GI: Non-distended. Musc: Right-sided edema with RUE with kinesiotaping, right knee without pain to palpation.  There is no tenderness along the patella tendon there is no evidence of medial or lateral joint line tenderness no pain with range of motion.  No evidence of effusion. Neurologic: Alert and oriented Motor:  RUE: 3+/5 proximal distal, stable RLE: 2/5 proximal to distal, stable, LLE 3- Skin: No evidence of breakdown, no evidence of rash Psych: Normal mood.  Normal behavior.   Assessment/Plan: 1. Functional deficits secondary to cardioembolic bicerebral infarcts which require 3+ hours per day of interdisciplinary therapy in a comprehensive inpatient rehab setting.  Physiatrist is providing close team supervision and 24 hour management of active medical problems listed below.  Physiatrist and rehab team continue to assess barriers to discharge/monitor patient progress toward functional and medical goals  Care Tool:  Bathing    Body parts bathed by patient: Chest, Abdomen, Left upper leg, Face, Right upper leg   Body parts bathed by  helper: Front perineal area, Buttocks     Bathing assist Assist Level: 2 Helpers(bed level for perineal hygiene)     Upper Body Dressing/Undressing Upper body dressing Upper body dressing/undressing activity did not occur (including orthotics): Refused What is the patient wearing?: Pull over shirt    Upper body assist Assist Level: Set up assist    Lower Body Dressing/Undressing Lower body dressing      What is the patient wearing?: Pants     Lower body assist Assist for lower body dressing: Total Assistance - Patient < 25%     Toileting Toileting Toileting Activity did not occur Landscape architect and hygiene only): Refused  Toileting assist Assist for toileting: 2 Helpers Assistive Device Comment: BSC   Transfers Chair/bed transfer  Transfers assist  Chair/bed transfer activity did not occur: Safety/medical concerns  Chair/bed transfer assist level: Minimal Assistance - Patient > 75%(increased time via stand pivot)     Locomotion Ambulation   Ambulation assist   Ambulation activity did not occur: Safety/medical concerns  Assist level: Dependent - Patient 0%(maxi sky walking sling ) Assistive device: Maxi Sky Max distance: 14f    Walk 10 feet activity   Assist  Walk 10 feet activity did not occur: Safety/medical concerns        Walk 50 feet activity   Assist Walk 50 feet with 2 turns activity did not occur: Safety/medical concerns         Walk 150 feet activity   Assist Walk 150 feet activity did not occur: Safety/medical concerns         Walk 10 feet on uneven surface  activity   Assist  Walk 10 feet on uneven surfaces activity did not occur: Safety/medical concerns         Wheelchair     Assist Will patient use wheelchair at discharge?: Yes Type of Wheelchair: Manual    Wheelchair assist level: Contact Guard/Touching assist Max wheelchair distance: 176f    Wheelchair 50 feet with 2 turns  activity    Assist    Wheelchair 50 feet with 2 turns activity did not occur: Safety/medical concerns(limited by fatigue)   Assist Level: Contact Guard/Touching assist   Wheelchair 150 feet activity     Assist Wheelchair 150 feet activity did not occur: Safety/medical concerns          Medical Problem List and Plan: 1.Persistent right side weakness with decreased mobilitysecondary to cardioembolic bi-cerebral infarction  Team conference today please see physician documentation under team conference tab, met with team face-to-face to discuss problems,progress, and goals. Formulized individual treatment plan based on medical history, underlying problem and comorbidities.  Will try to get wife in for family training on Friday  2. Antithrombotics: -DVT/anticoagulation:ELIQUIS -antiplatelet therapy:  3. Pain Management:Ultracet as needed, knee pain with weightbearing may be meniscal certainly no evidence of effusion 4. Mood:Provide emotional support  -appreciate neuropsych input re: coping with episodic stress/anxiety -antipsychotic agents: none 5. Neuropsych: This patientiscapable of making decisions on hisown behalf. 6. Skin/Wound Care:Routine skin checks 7. Fluids/Electrolytes/Nutrition:encourage PO 8. ABLA/ Upper GI bleed EGD 3. Follow-up GI services. Continue PPI -serial H/H's   -hgb 7.9 on 4/3  -1u PRBC Wednesday- 4/1    Labs refused repeat 4/13 10. Chronic atrial fibrillation with TAVR procedure September 2019 11. Non-STEMI. Thought secondary to thrombus status post cardiac catheterization. Continue eliquis 12. Acute on chronic diastolic congestive heart failure. Lasix 40 mg daily  Filed Weights   03/05/19 0418 03/06/19 0508 03/07/19 0140  Weight: 108.3 kg 108.2 kg 108.2 kg   Stable on 4/15 13. Hypertension. Norvasc 5 mg daily, Lopressor 50 mg twice a day. Monitor with increased mobility Vitals:   03/06/19  1938 03/07/19 0353  BP: 106/73 119/81  Pulse: (!) 58 60  Resp: 18 17  Temp: 98.1 F (36.7 C) 98.1 F (36.7 C)  SpO2: 100% 100%   controlled on 4/15 14. Hyperlipidemia. Lipitor 15. Persistent leukocytosis:  WBCs 12.9 on 4/3, improving  Afebrile   16.  Hypokalemia  Potassium 4.1 on 4/3 after supplementation  Labs refused will reorder later in week  LOS: 23 days A FACE TO FSciotoE Jozlynn Plaia 03/07/2019, 11:36 AM

## 2019-03-07 NOTE — Progress Notes (Addendum)
Speech Language Pathology Daily Session Note  Patient Details  Name: Joel Rogers MRN: [id number] Date of Birth: 07/25/1951  Today's Date: 03/07/2019 SLP Individual Time: 1045-1130 SLP Individual Time Calculation (min): 45 min  Short Term Goals: Week 3: SLP Short Term Goal 1 (Week 3): Pt will complete semi-complex tasks with Min A verbal cues for functional problem solving.  SLP Short Term Goal 1 - Progress (Week 3): Progressing toward goal SLP Short Term Goal 2 (Week 3): Pt will demonstrate selective attention for ~ 30 minutes in moderately distracting environment with Min A cues.  SLP Short Term Goal 2 - Progress (Week 3): Progressing toward goal SLP Short Term Goal 3 (Week 3): Pt will self-monitor and self-correct errors in 7 out of 10 opportunities with Mod A cues.  SLP Short Term Goal 3 - Progress (Week 3): Met SLP Short Term Goal 4 (Week 3): Pt will monitor participation in skilled interdisciplinary therapy sessions by completing calendar of participation in therapies.  SLP Short Term Goal 4 - Progress (Week 3): Not met SLP Short Term Goal 5 (Week 3): With Min A question cues, pt will recall compensatory memory strategies in 5 out of 10 opportunities.  SLP Short Term Goal 5 - Progress (Week 3): Progressing toward goal  Skilled Therapeutic Interventions: Pt was seen for skilled ST intervention targeting aforementioned goals for improved cognition.  Pt recalled therapy activity from yesterday, and was eager to complete the next task. Good participation throughout therapy today. Pt required mod+ cues to complete deductive reasoning puzzle #2, with cues for attention to detail. Pt requires Mod+ cues for self-monitoring and self-correcting. This task was completed in a quiet, non-distracting environment. Anticipate the need for a simpler task in an environment requiring selective attention. Mod verbal cues given to figure correct amount of change he carries in his pocket. Pt left upright in  wheelchair, chair alarm on, and all needs within reach. Continue per current plan of care.  Pain Pain Assessment Pain Scale: 0-10 Pain Score: 0-No pain  Therapy/Group: Individual Therapy  Celia B. Quentin Ore, [facility], CCC-SLP Speech Language Pathologist  Shonna Chock 03/07/2019, 3:12 PM

## 2019-03-07 NOTE — Progress Notes (Signed)
Occupational Therapy Session Note  Patient Details  Name: Joel Rogers MRN: [id number] Date of Birth: [birth year]  Today's Date: 03/07/2019 OT Individual Time: 8250-5397 OT Individual Time Calculation (min): 27 min  and Today's Date: 03/07/2019 OT Missed Time: 45 Minutes Missed Time Reason: Patient unwilling/refused to participate without medical reason   Short Term Goals: Week 3:  OT Short Term Goal 1 (Week 3): Pt will complete BSC transfer using slideboard with Max A    OT Short Term Goal 2 (Week 3): Pt will complete lateral leans with Mod A during toileting OT Short Term Goal 3 (Week 3): Pt will elevate pants over hips 25% of the way, utilizing lateral leans EOB  Skilled Therapeutic Interventions/Progress Updates:    Upon entering the room, pt seated in wheelchair awaiting OT arrival. OT discussing family education with pt this week in preparation for upcoming discharge. OT discussing with pt the need to work on endurance , stand pivots, and functional transfers for safety with discharge. Pt refusing and just saying, " No". Pt verbalized, " when I go home everything will be better." OT expressed concerns with pt's wife being able to assist him secondary to self limiting behavior and refusal to participate. Pt then yelling, " I'm not going to get a hospital bed or a ramp." Pt expects children to "pick him up and walk him upstairs." Pt very unrealistic about current level of functional and expectations for family members based on current level of care. OT expressed that focus of all OT and PT sessions at this point would be functional transfers, safety, and mobility. Pt continues to refuse and remained seated in wheelchair.   Therapy Documentation Precautions:  Precautions Precautions: Fall Restrictions Weight Bearing Restrictions: No General: General OT Amount of Missed Time: 45 Minutes Pain: Pain Assessment Pain Scale: 0-10 Pain Score: 0-No pain ADL: ADL Upper Body Bathing:  Maximal assistance Where Assessed-Upper Body Bathing: Bed level Lower Body Bathing: Dependent Where Assessed-Lower Body Bathing: Bed level Upper Body Dressing: Maximal assistance Where Assessed-Upper Body Dressing: Bed level Lower Body Dressing: Dependent Where Assessed-Lower Body Dressing: Bed level   Therapy/Group: Individual Therapy  [name] 03/07/2019, 2:09 PM

## 2019-03-08 ENCOUNTER — Inpatient Hospital Stay (HOSPITAL_COMMUNITY): Payer: Self-pay | Admitting: Occupational Therapy

## 2019-03-08 ENCOUNTER — Inpatient Hospital Stay (HOSPITAL_COMMUNITY): Payer: Self-pay | Admitting: Physical Therapy

## 2019-03-08 ENCOUNTER — Inpatient Hospital Stay (HOSPITAL_COMMUNITY): Payer: Self-pay | Admitting: Speech Pathology

## 2019-03-08 NOTE — Progress Notes (Signed)
Speech Language Pathology Daily Session Note  Patient Details  Name: Joel Rogers MRN: [id number] Date of Birth: 12/16/1950  Today's Date: 03/08/2019 SLP Individual Time: 0800-0900 SLP Individual Time Calculation (min): 60 min  Short Term Goals: Week 4: SLP Short Term Goal 1 (Week 4): Pt will complete semi-complex tasks with Min A verbal cues for functional problem solving.  SLP Short Term Goal 2 (Week 4): Pt will demonstrate selective attention for ~ 30 minutes in moderately distracting environment with Min A cues.  SLP Short Term Goal 3 (Week 4): Pt will self-monitor and self-correct errors in 7 out of 10 opportunities with Min A cues.  SLP Short Term Goal 4 (Week 4): With Min A question cues, pt will recall compensatory memory strategies in 5 out of 10 opportunities.   Skilled Therapeutic Interventions:  Skilled treatment session focused on cognition goals. SLP received pt upright in bed with pt c/o not already being dressed and out of bed. Pt continues to demonstrate no insight into deficits or need to participate in therapies. Pt repetitively stated that he needed to apologize to OT from yesterday d/t attitude. Despite continued attempts at education, pt believes that once he is home, he will be able to physically perform tasks such as dressing, walking by himself. Family education is scheduled for April 17th. Pt continues to be at very high fall risk and requires extensive aid to perform ADLs. SLP further facilitated session by providing supervision for completion of semi-complex design task targeting selective attention, attention to specific details, self-monitoring and working memory. Pt left upright in bed, bed alarm on and all needs within reach. Continue per current plan of care.      Pain Pain Assessment Pain Scale: 0-10 Pain Score: 0-No pain  Therapy/Group: Individual Therapy  Joel Rogers 03/08/2019, 11:22 AM

## 2019-03-08 NOTE — Progress Notes (Signed)
Physical Therapy Session Note  Patient Details  Name: Joel Rogers MRN: [id number] Date of Birth: 10/12/1951  Today's Date: 03/08/2019 PT Individual Time: 1000-1100 PT Individual Time Calculation (min): 60 min   Short Term Goals: Week 4:  PT Short Term Goal 1 (Week 4): STG=LTG due to ELOS  Skilled Therapeutic Interventions/Progress Updates:    Patient received in bed, stating "yesterday I just wasn't feeling it, I was depressed and I'm ready to get up and move around". Of note, attempted to utilize motivational interviewing strategies this session to assist in improving patient's understanding of current deficits and very unsafe plan for going home; extensive education also provided on reasons why it is currently unsafe for patient to return home at this time however patient appeared to fail to internalize or process education today.  Rolled side to side with modA and required totalA for donning of shorts in bed, able to then perform bed mobility with use of bed features and MinA/extended time. TotalA for donning of compression socks and shoes today, then able to perform sliding board transfer to [facility] with [place] for board placement, also MinAx2 for transfer (one to assist with slide, one to stabilize WC). Transported patient to day room with totalA in [facility], then allowed patient to make multiple attempts at sit to stand from [facility] level with S and VC for technique- patient unable but continues to state "oh I will be able to do this when I get home, I have a lift chair anyway". Performed multiple sit to stands from [facility] level with MaxAx2 and ModAx2 to gain initial standing balance; instructed patient to perform pre-gait activities but he refused, stating "I'm not there strength wise yet, I need to sit down". Unable to convince patient that he needs to practice pre-gait/gait techniques, he continues to state that his two sons will carry him up the stairs to get into his house and help him with standing. Had  frank conversation with patient about his expectations that he will magically be able to do things at home he either has not been able to progress to in CIR yet or has been refusing to attempt- therapy staff do not feel return home is a safe DC plan at this point. He was left up in [facility] with seat belt alarm active, all needs otherwise met at this time.   Therapy Documentation Precautions:  Precautions Precautions: Fall Restrictions Weight Bearing Restrictions: No General:   Vital Signs:  Pain: Pain Assessment Pain Scale: 0-10 Pain Score: 0-No pain    Therapy/Group: Individual Therapy  Deniece Ree PT, DPT, CBIS  Supplemental Physical Therapist [facility]    Pager [phone] Acute Rehab Office [phone]   03/08/2019, 11:25 AM

## 2019-03-08 NOTE — Progress Notes (Signed)
[place] PHYSICAL MEDICINE & REHABILITATION PROGRESS NOTE   Subjective/Complaints:  Patient is okay with discharge date next week.  We discussed recommendations for his wife to make a hospital visit for caregiver training.  Patient states his wife took care of her own mother post stroke and is reluctant to take care of him.  He spoke with her by phone yesterday Patient feels like he is less severely involved in the patient's mother [facility] and was unable to speak  ROS: Denies CP, SOB, N/V/D  Objective:   No results found. No results for input(s): WBC, HGB, HCT, PLT in the last 72 hours. No results for input(s): NA, K, CL, CO2, GLUCOSE, BUN, CREATININE, CALCIUM in the last 72 hours.  Intake/Output Summary (Last 24 hours) at 03/08/2019 1037 Last data filed at 03/08/2019 0900 Gross per 24 hour  Intake 720 ml  Output 2400 ml  Net -1680 ml     Physical Exam: Vital Signs Blood pressure 110/81, pulse (!) 59, temperature (!) 97.4 F (36.3 C), resp. rate 17, height 6\' 1"  (1.854 m), weight 101.6 kg, SpO2 100 %. Constitutional: NAD.  Vital signs reviewed.Marland Kitchen. HENT: Normocephalic.  Atraumatic. Eyes: EOMI.  No discharge. Cardiovascular: No JVD. Respiratory: Normal effort. GI: Non-distended. Musc: Right-sided edema with RUE with kinesiotaping, right knee without pain to palpation.  There is no tenderness along the patella tendon there is no evidence of medial or lateral joint line tenderness no pain with range of motion.  No evidence of effusion. Neurologic: Alert and oriented Motor:  RUE: 3+/5 proximal distal, stable RLE: 2/5 proximal to distal, stable, LLE 3- Skin: No evidence of breakdown, no evidence of rash Psych: Normal mood.  Normal behavior.   Assessment/Plan: 1. Functional deficits secondary to cardioembolic bicerebral infarcts which require 3+ hours per day of interdisciplinary therapy in a comprehensive inpatient rehab setting.  Physiatrist is providing close  team supervision and 24 hour management of active medical problems listed below.  Physiatrist and rehab team continue to assess barriers to discharge/monitor patient progress toward functional and medical goals  Care Tool:  Bathing    Body parts bathed by patient: Chest, Abdomen, Left upper leg, Face, Right upper leg   Body parts bathed by helper: Front perineal area, Buttocks     Bathing assist Assist Level: 2 Helpers(bed level for perineal hygiene)     Upper Body Dressing/Undressing Upper body dressing Upper body dressing/undressing activity did not occur (including orthotics): Refused What is the patient wearing?: Pull over shirt    Upper body assist Assist Level: Set up assist    Lower Body Dressing/Undressing Lower body dressing      What is the patient wearing?: Pants     Lower body assist Assist for lower body dressing: Total Assistance - Patient < 25%     Toileting Toileting Toileting Activity did not occur Press photographer(Clothing management and hygiene only): Refused  Toileting assist Assist for toileting: 2 Helpers Assistive Device Comment: BSC   Transfers Chair/bed transfer  Transfers assist  Chair/bed transfer activity did not occur: Safety/medical concerns  Chair/bed transfer assist level: Minimal Assistance - Patient > 75%(increased time via stand pivot)     Locomotion Ambulation   Ambulation assist   Ambulation activity did not occur: Safety/medical concerns  Assist level: Dependent - Patient 0%(maxi sky walking sling ) Assistive device: Maxi Sky Max distance: 193ft    Walk 10 feet activity   Assist  Walk 10 feet activity did not occur: Safety/medical concerns  Walk 50 feet activity   Assist Walk 50 feet with 2 turns activity did not occur: Safety/medical concerns         Walk 150 feet activity   Assist Walk 150 feet activity did not occur: Safety/medical concerns         Walk 10 feet on uneven surface  activity   Assist  Walk 10 feet on uneven surfaces activity did not occur: Safety/medical concerns         Wheelchair     Assist Will patient use wheelchair at discharge?: Yes Type of Wheelchair: Manual    Wheelchair assist level: Contact Guard/Touching assist Max wheelchair distance: 181ft    Wheelchair 50 feet with 2 turns activity    Assist    Wheelchair 50 feet with 2 turns activity did not occur: Safety/medical concerns(limited by fatigue)   Assist Level: Contact Guard/Touching assist   Wheelchair 150 feet activity     Assist Wheelchair 150 feet activity did not occur: Safety/medical concerns          Medical Problem List and Plan: 1.Persistent right side weakness with decreased mobilitysecondary to cardioembolic bi-cerebral infarction  Continue CIR PT OT speech  Will try to get wife in for family training on Friday  2. Antithrombotics: -DVT/anticoagulation:ELIQUIS -antiplatelet therapy:  3. Pain Management:Ultracet as needed, knee pain with weightbearing may be meniscal certainly no evidence of effusion 4. Mood:Provide emotional support  -appreciate neuropsych input re: coping with episodic stress/anxiety -antipsychotic agents: none 5. Neuropsych: This patientiscapable of making decisions on hisown behalf. 6. Skin/Wound Care:Routine skin checks 7. Fluids/Electrolytes/Nutrition:encourage PO 8. ABLA/ Upper GI bleed EGD 3. Follow-up GI services. Continue PPI -serial H/H's   -hgb 7.9 on 4/3  -1u PRBC Wednesday- 4/1    Labs refused repeat 4/13 10. Chronic atrial fibrillation with TAVR procedure September 2019 11. Non-STEMI. Thought secondary to thrombus status post cardiac catheterization. Continue eliquis 12. Acute on chronic diastolic congestive heart failure. Lasix 40 mg daily  Filed Weights   03/06/19 0508 03/07/19 0140 03/08/19 0500  Weight: 108.2 kg 108.2 kg 101.6 kg   7kg lower  4/16?  Question  technique 13. Hypertension. Norvasc 5 mg daily, Lopressor 50 mg twice a day. Monitor with increased mobility Vitals:   03/07/19 1950 03/08/19 0522  BP: 109/74 110/81  Pulse: 71 (!) 59  Resp: 18 17  Temp: (!) 97.5 F (36.4 C) (!) 97.4 F (36.3 C)  SpO2: 100% 100%   controlled on 4/16 14. Hyperlipidemia. Lipitor 15. Persistent leukocytosis:  WBCs 12.9 on 4/3, improving  Afebrile, still refusing labs, will ask neuropsychology to evaluate reason   16.  Hypokalemia  Potassium 4.1 on 4/3 after supplementation  Labs refused will reorder later in week  LOS: 24 days A FACE TO FACE EVALUATION WAS PERFORMED  Erick Colace 03/08/2019, 10:37 AM

## 2019-03-08 NOTE — Progress Notes (Signed)
Social Work Patient ID: Joel Rogers, male   DOB: 09-27-1951, 68 y.o.   MRN: [id number] Spoke with wife via telephone and talked with pt regarding team conference progress and wife's need to come in for education may be a few times prior to discharge home. Have scheduled wife to be here tomorrow at 9:00-12:00 to begin training. Feel wife does not realize the amount of care pt will require due to he has been telling her he is doing well here. Will work on discharge needs.

## 2019-03-08 NOTE — Progress Notes (Signed)
Occupational Therapy Session Note  Patient Details  Name: Joel Rogers MRN: [id number] Date of Birth: Feb 12, 1951  Today's Date: 03/08/2019 OT Individual Time: 8466-5993 OT Individual Time Calculation (min): 84 min    Short Term Goals: Week 3:  OT Short Term Goal 1 (Week 3): Pt will complete BSC transfer using slideboard with Max A    OT Short Term Goal 2 (Week 3): Pt will complete lateral leans with Mod A during toileting OT Short Term Goal 3 (Week 3): Pt will elevate pants over hips 25% of the way, utilizing lateral leans EOB  Skilled Therapeutic Interventions/Progress Updates:    Upon entering the room, pt seated in wheelchair with no c/o pain. Pt agreeable to OT intervention. OT discussed need to practice toilet transfer with focus on slide board transfer for safety. Pt's wife would be unable to perform stand pivot transfer on her own to commode chair. Pt making several attempts at transferring onto commode chair but not making much progress. Steps broken down for sequencing, head and hip relationship, foot placement, and hand placement. Pt able to complete transfer with min A of 2 for safety wheelchair > drop arm > wheelchair. Pt taking rest break and able to verbalize each step and then performing tasks again with min A of 1 for safety and to steady equipment. Pt is agreeable to drop arm commode chair at home for toileting needs. OT continued to discuss hospital bed an lift chair and pt understanding that he will not be able to stand and pivot to get into wheelchair or into lift chair. OT discussed expectations for family education tomorrow so pt knows what to mentally and physically prepare for. Pt remaining in wheelchair with call bell and all needed items within reach.   Therapy Documentation Precautions:  Precautions Precautions: Fall Restrictions Weight Bearing Restrictions: No   Pain: Pain Assessment Pain Scale: 0-10 Pain Score: 0-No pain ADL: ADL Upper Body Bathing:  Maximal assistance Where Assessed-Upper Body Bathing: Bed level Lower Body Bathing: Dependent Where Assessed-Lower Body Bathing: Bed level Upper Body Dressing: Maximal assistance Where Assessed-Upper Body Dressing: Bed level Lower Body Dressing: Dependent Where Assessed-Lower Body Dressing: Bed level   Therapy/Group: Individual Therapy  [name] 03/08/2019, 2:57 PM

## 2019-03-09 ENCOUNTER — Encounter (HOSPITAL_COMMUNITY): Payer: Self-pay | Admitting: Speech Pathology

## 2019-03-09 ENCOUNTER — Inpatient Hospital Stay (HOSPITAL_COMMUNITY): Payer: Self-pay | Admitting: Physical Therapy

## 2019-03-09 ENCOUNTER — Encounter (HOSPITAL_COMMUNITY): Payer: Self-pay | Admitting: Occupational Therapy

## 2019-03-09 LAB — BASIC METABOLIC PANEL
Anion gap: 9 (ref 5–15)
BUN: 13 mg/dL (ref 8–23)
CO2: 17 mmol/L — ABNORMAL LOW (ref 22–32)
Calcium: 8.7 mg/dL — ABNORMAL LOW (ref 8.9–10.3)
Chloride: 110 mmol/L (ref 98–111)
Creatinine, Ser: 0.83 mg/dL (ref 0.61–1.24)
GFR calc Af Amer: >60 mL/min (ref >60)
GFR calc non Af Amer: >60 mL/min (ref >60)
Glucose, Bld: 105 mg/dL — ABNORMAL HIGH (ref 70–99)
Potassium: 4.8 mmol/L (ref 3.5–5.1)
Sodium: 136 mmol/L (ref 135–145)

## 2019-03-09 LAB — CBC
HCT: 28.3 % — ABNORMAL LOW (ref 39.0–52.0)
Hemoglobin: 8.9 g/dL — ABNORMAL LOW (ref 13.0–17.0)
MCH: 24.3 pg — ABNORMAL LOW (ref 26.0–34.0)
MCHC: 31.4 g/dL (ref 30.0–36.0)
MCV: 77.3 fL — ABNORMAL LOW (ref 80.0–100.0)
Platelets: 234 10*3/uL (ref 150–400)
RBC: 3.66 MIL/uL — ABNORMAL LOW (ref 4.22–5.81)
RDW: 18.9 % — ABNORMAL HIGH (ref 11.5–15.5)
WBC: 10.9 10*3/uL — ABNORMAL HIGH (ref 4.0–10.5)
nRBC: 0 % (ref 0.0–0.2)

## 2019-03-09 MED ORDER — LIDOCAINE-PRILOCAINE 2.5-2.5 % EX CREA
TOPICAL_CREAM | Freq: Once | CUTANEOUS | Status: DC
  Filled 2019-03-09: qty 5

## 2019-03-09 NOTE — Progress Notes (Signed)
Occupational Therapy Session Note  Patient Details  Name: Joel Rogers MRN: [id number] Date of Birth: December 15, 1950  Today's Date: 03/09/2019 OT Individual Time: 2694-8546 OT Individual Time Calculation (min): 61 min   Short Term Goals: Week 1:  OT Short Term Goal 1 (Week 1): Pt will complete sit > stand with max assist +2 to decrease burden of care for LB bathing/dressing OT Short Term Goal 1 - Progress (Week 1): Met OT Short Term Goal 2 (Week 1): Pt will complete UB dressing with mod assist OT Short Term Goal 2 - Progress (Week 1): Not met OT Short Term Goal 3 (Week 1): Pt will complete toilet transfer with max assist +2 OT Short Term Goal 3 - Progress (Week 1): Not met OT Short Term Goal 4 (Week 1): Pt will complete bathing with mod assist from bed level.   OT Short Term Goal 4 - Progress (Week 1): Not met  Skilled Therapeutic Interventions/Progress Updates:    Pt greeted in w/c with spouse Kennyth Lose present for hands on family education. Started session with slideboard transfer to bed. Per Kennyth Lose, she had hands on practice during PT session, so asked her to demonstrate. Pt c/o spouse's setup, and they were unable to problem solve together. OT recommended they start over, and provided instruction for setup and technique. Pt and spouse attempted transfer x2 again, and he had to return to w/c due to c/o of buttocks bumping into wheel. Emphasized to pt that he will not have perfect conditions for transfers at home, and that he needed to be adaptable and flexible when completing slideboard transfers with spouse. The next attempt, he was able to reach the bed with spouse assist, however this took about ten minutes with OT providing mod cuing for spouse. OT provided demonstration for next transfer, as spouse had tendency to not place slideboard fully under hips/buttocks. Pt completed transfer with Min A, in less than a minute. Discussed with Kennyth Lose angling board so that pt clears w/c wheel. She  practiced with OT a few times, and then practiced with pt for transfer back to w/c. Kennyth Lose was still uanble to place board fully under pt, and he slid off of it during transfer. They were unable to problem solve without OT intervening and providing Mod A for scooting hips back onto bed. Emphasized with Kennyth Lose importance of correct board placement to maximize ease of transfer as well as safety. Initiated education regarding lateral lean technique for LB dressing EOB and also for toileting tasks. Advised placing BSC beside bed so he can lean onto bed, and then onto another supportive surface on the other side (like his locked w/c). Recommended completing UB/LB bathing/dressing EOB (with hospital bed), utilizing lateral leans as needed. Spouse Kennyth Lose would continue to benefit from transfer training and assisting pt with dressing tasks/toileting using lateral lean technique in prep for home. Hands on education initiated regarding donning leg rests, with Kennyth Lose needing max cuing. Pt left in w/c with all needs within reach, half lap tray, and safety belt fastened.      Therapy Documentation Precautions:  Precautions Precautions: Fall Restrictions Weight Bearing Restrictions: No Pain: in L LE with movement. Per pt, pain was manageable during tx without RN interventions  Pain Assessment Pain Scale: 0-10 Pain Score: 0-No pain ADL: ADL Upper Body Bathing: Maximal assistance Where Assessed-Upper Body Bathing: Bed level Lower Body Bathing: Dependent Where Assessed-Lower Body Bathing: Bed level Upper Body Dressing: Maximal assistance Where Assessed-Upper Body Dressing: Bed level Lower Body Dressing: Dependent  Where Assessed-Lower Body Dressing: Bed level      Therapy/Group: Individual Therapy  Dalaney Needle A Saulo Anthis 03/09/2019, 12:14 PM

## 2019-03-09 NOTE — Progress Notes (Signed)
Physical Therapy Session Note  Patient Details  Name: Joel Rogers MRN: [id number] Date of Birth: 05/13/1951  Today's Date: 03/09/2019 PT Individual Time: 1330-1430 PT Individual Time Calculation (min): 60 min   Short Term Goals: Week 4:  PT Short Term Goal 1 (Week 4): STG=LTG due to ELOS  Skilled Therapeutic Interventions/Progress Updates:    Patient received in WC, willing to participate in therapy this afternoon. Transported him to PT gym totalA in WC for time management, then focused session on breakdown of mechanics and technique for standing from WC level. Heavy and repetitive cues provided for scooting forward in chair, getting feet in proper position, pushing up from armrests, and head-hips relationship for standing today. Able to perform multiple stands from WC level with heavy ModAx1 and Min guard x1 for safety today however!!! Patient able to stand for short periods of time (approximately 1-2 minutes) to toss bean bags into basketball hoop, also worked on kicking yoga block in standing with Min guard x2 and high levels of motivation and encouragement today- patient very anxious and fearful of falling, also with great difficulty in effectively bearing weight down through R LE during sit to stand transfer as well as having difficulty with adequate lateral weight shifts for kicking yoga block. He was then able to self-propel WC approximately 50ft with B UEs, then was returned to his room totalA in WC due to time constraints. He was left up in his WC with all needs met and seat belt alarm active.   Therapy Documentation Precautions:  Precautions Precautions: Fall Restrictions Weight Bearing Restrictions: No General:   Pain: Pain Assessment Pain Scale: 0-10 Pain Score: 0-No pain    Therapy/Group: Individual Therapy  Kristen Unger PT, DPT, CBIS  Supplemental Physical Therapist [place]    Pager [phone] Acute Rehab Office [phone]   03/09/2019, 3:49 PM  

## 2019-03-09 NOTE — Progress Notes (Signed)
Social Work Patient ID: Joel Rogers, male   DOB: 1951-02-28, 68 y.o.   MRN: [id number] Met with pt and wife while here for education and it is going well. Wife does see the amount of care pt will require and wants to provide this. She has ordered the portable ramp to get into the home. She is agreeable to all of the equipment therapy team recommends. Have gone ahead and ordered equipment to be delivered to home on Monday, will contact wife to set up delivery. Wife does feel she will need to come back on Monday and have scheduled her from 10-12 to practice transfer's and for PT & OT sessions. Will see on Monday. Pt states;" My main goal is to go home. She is the boss." Will work toward discharge Tuesday

## 2019-03-09 NOTE — Progress Notes (Signed)
Physical Therapy Session Note  Patient Details  Name: Joel Rogers MRN: [id number] Date of Birth: [birth year]  Today's Date: 03/09/2019 PT Individual Time: 9211-9417 PT Individual Time Calculation (min): 45 min   Short Term Goals: Week 4:  PT Short Term Goal 1 (Week 4): STG=LTG due to ELOS  Skilled Therapeutic Interventions/Progress Updates:    Patient received in bed, motivated to work with PT today and continuing to claim that he will be able to suddenly do all activities at home. Wife present for family education and agreeable and pleasant, reports she has ordered a bed with features for home and is open to other equipment. Able to perform bed mobility with min guard and use of bed features/railing, able to partially don shorts by rolling at EOB despite PT cues for safety then after extended time of trying this method returned to supine with modA and rolled side to side with ModA for complete and safer method of donning pants. Returned to EOB with min guard with use of bed features. Able to then perform lateral scooting transfer with MinA and totalA for placement of board. Education provided to wife for all bed mobility and lateral scoot transfers. Allowed patient to attempt sit to stand transfers with extended time, patient unable to make even one attempt and reports "I'm scared of trying this and falling". PT then performed sit to stand transfer with ModAx2 from [facility] level, education provided to patient and wife why this is not feasible at home. Returned to sitting, then began training wife on correct placement of SB- she will need more practice, as had to end session at this point due to time limitations. Firmly enforced education to patient and wife regarding items that patient/wife should be able to do safely and that is not feasible to expect him to be able to stand and walk with RW or be carried around by children today. Now recommending hospital bed/bed with features, RW, bariatric WC, and  sliding board at minimum. He was left up in [facility] with all needs met and wife present, hand-off to OT for next skilled session.   Therapy Documentation Precautions:  Precautions Precautions: Fall Restrictions Weight Bearing Restrictions: No    Pain: Pain Assessment Pain Scale: 0-10 Pain Score: 0-No pain    Therapy/Group: Individual Therapy  Deniece Ree PT, DPT, CBIS  Supplemental Physical Therapist [facility]    Pager [phone] Acute Rehab Office [phone]   03/09/2019, 12:50 PM

## 2019-03-09 NOTE — Progress Notes (Signed)
Speech Language Pathology Daily Session Note  Patient Details  Name: Joel Rogers MRN: [id number] Date of Birth: [birth year]  Today's Date: 03/09/2019 SLP Individual Time: 9390-3009 SLP Individual Time Calculation (min): 45 min  Short Term Goals: Week 4: SLP Short Term Goal 1 (Week 4): Pt will complete semi-complex tasks with Min A verbal cues for functional problem solving.  SLP Short Term Goal 2 (Week 4): Pt will demonstrate selective attention for ~ 30 minutes in moderately distracting environment with Min A cues.  SLP Short Term Goal 3 (Week 4): Pt will self-monitor and self-correct errors in 7 out of 10 opportunities with Min A cues.  SLP Short Term Goal 4 (Week 4): With Min A question cues, pt will recall compensatory memory strategies in 5 out of 10 opportunities.   Skilled Therapeutic Interventions:  Skilled treatment session focused on education with pt and his wife. This Clinical research associate provided education on pt's refusal to get hospital bed d/t decreased insight into deficits. Pt in agreement that he "will be better at home" and he "will be able to get around at home." Wife unaware of pt's refusal for equipment and refusal to have labs drawn. Education and support provided to pt and wife on cognitive deficits as well as deficits in insight/awareness of deficits. Pt and wife in agreement with receiving equipment (including ramp) and pt in agreement with having labs drawn. Pt left upright in bed with wife present.      Pain Pain Assessment Pain Scale: 0-10 Pain Score: 0-No pain  Therapy/Group: Individual Therapy  Martie Fulgham 03/09/2019, 1:32 PM

## 2019-03-09 NOTE — Progress Notes (Signed)
[place] PHYSICAL MEDICINE & REHABILITATION PROGRESS NOTE   Subjective/Complaints:  No issues overnight, wife is in for family training.  Long discussion regarding reason for blood draws.  ROS: Denies CP, SOB, N/V/D  Objective:   No results found. No results for input(s): WBC, HGB, HCT, PLT in the last 72 hours. No results for input(s): NA, K, CL, CO2, GLUCOSE, BUN, CREATININE, CALCIUM in the last 72 hours.  Intake/Output Summary (Last 24 hours) at 03/09/2019 0901 Last data filed at 03/09/2019 0849 Gross per 24 hour  Intake 240 ml  Output 1000 ml  Net -760 ml     Physical Exam: Vital Signs Blood pressure 102/69, pulse 68, temperature 98.7 F (37.1 C), temperature source Oral, resp. rate 16, height 6\' 1"  (1.854 m), weight 102 kg, SpO2 100 %. Constitutional: NAD.  Vital signs reviewed.Marland Kitchen. HENT: Normocephalic.  Atraumatic. Eyes: EOMI.  No discharge. Cardiovascular: No JVD. Respiratory: Normal effort. GI: Non-distended. Musc: Right-sided edema with RUE with kinesiotaping, right knee without pain to palpation.  There is no tenderness along the patella tendon there is no evidence of medial or lateral joint line tenderness no pain with range of motion.  No evidence of effusion. Neurologic: Alert and oriented Motor:  RUE: 3+/5 proximal distal, stable RLE: 2/5 proximal to distal, stable, LLE 3- Skin: No evidence of breakdown, no evidence of rash Psych: Normal mood.  Normal behavior.   Assessment/Plan: 1. Functional deficits secondary to cardioembolic bicerebral infarcts which require 3+ hours per day of interdisciplinary therapy in a comprehensive inpatient rehab setting.  Physiatrist is providing close team supervision and 24 hour management of active medical problems listed below.  Physiatrist and rehab team continue to assess barriers to discharge/monitor patient progress toward functional and medical goals  Care Tool:  Bathing    Body parts bathed by patient: Chest,  Abdomen, Left upper leg, Face, Right upper leg   Body parts bathed by helper: Front perineal area, Buttocks     Bathing assist Assist Level: 2 Helpers(bed level for perineal hygiene)     Upper Body Dressing/Undressing Upper body dressing Upper body dressing/undressing activity did not occur (including orthotics): Refused What is the patient wearing?: Pull over shirt    Upper body assist Assist Level: Set up assist    Lower Body Dressing/Undressing Lower body dressing      What is the patient wearing?: Pants     Lower body assist Assist for lower body dressing: Total Assistance - Patient < 25%     Toileting Toileting Toileting Activity did not occur Press photographer(Clothing management and hygiene only): Refused  Toileting assist Assist for toileting: 2 Helpers Assistive Device Comment: BSC   Transfers Chair/bed transfer  Transfers assist  Chair/bed transfer activity did not occur: Safety/medical concerns  Chair/bed transfer assist level: Minimal Assistance - Patient > 75%(lateral scoot )     Locomotion Ambulation   Ambulation assist   Ambulation activity did not occur: Refused  Assist level: Dependent - Patient 0%(maxi sky walking sling ) Assistive device: Maxi Sky Max distance: 843ft    Walk 10 feet activity   Assist  Walk 10 feet activity did not occur: Safety/medical concerns        Walk 50 feet activity   Assist Walk 50 feet with 2 turns activity did not occur: Safety/medical concerns         Walk 150 feet activity   Assist Walk 150 feet activity did not occur: Safety/medical concerns         Walk 10  feet on uneven surface  activity   Assist Walk 10 feet on uneven surfaces activity did not occur: Safety/medical concerns         Wheelchair     Assist Will patient use wheelchair at discharge?: Yes Type of Wheelchair: Manual    Wheelchair assist level: Contact Guard/Touching assist Max wheelchair distance: 150ft    Wheelchair 50 feet  with 2 turns activity    Assist    Wheelchair 50 feet with 2 turns activity did not occur: Safety/medical concerns(limited by fatigue)   Assist Level: Contact Guard/Touching assist   Wheelchair 150 feet activity     Assist Wheelchair 150 feet activity did not occur: Safety/medical concerns          Medical Problem List and Plan: 1.Persistent right side weakness with decreased mobilitysecondary to cardioembolic bi-cerebral infarction  Continue CIR PT OT speech  Wife is in for training.  2. Antithrombotics: -DVT/anticoagulation:ELIQUIS -antiplatelet therapy:  3. Pain Management:Ultracet as needed, knee pain with weightbearing may be meniscal certainly no evidence of effusion 4. Mood:Provide emotional support  -appreciate neuropsych input re: coping with episodic stress/anxiety -antipsychotic agents: none 5. Neuropsych: This patientiscapable of making decisions on hisown behalf. 6. Skin/Wound Care:Routine skin checks 7. Fluids/Electrolytes/Nutrition:encourage PO 8. ABLA/ Upper GI bleed EGD 3. Follow-up GI services. Continue PPI -serial H/H's   -hgb 7.9 on 4/3  -1u PRBC Wednesday- 4/1    Labs refused repeat 4/13, the patient states that he is afraid of needles but would agree to blood draw if we put a numbing medicine at the antecubital fossa 10. Chronic atrial fibrillation with TAVR procedure September 2019 11. Non-STEMI. Thought secondary to thrombus status post cardiac catheterization. Continue eliquis 12. Acute on chronic diastolic congestive heart failure. Lasix 40 mg daily  Filed Weights   03/07/19 0140 03/08/19 0500 03/09/19 0500  Weight: 108.2 kg 101.6 kg 102 kg   7kg lower  4/16?  Question technique, has been on Lasix with negative fluid balance 13. Hypertension. Norvasc 5 mg daily, Lopressor 50 mg twice a day. Monitor with increased mobility Vitals:   03/09/19 0554 03/09/19 0600  BP: 102/69   Pulse:  (!) 51 68  Resp: 16   Temp: 98.7 F (37.1 C)   SpO2: 100%    controlled on 4/17, running on low side would like to check hydration status 14. Hyperlipidemia. Lipitor 15. Persistent leukocytosis:  WBCs 12.9 on 4/3, recommend CBC to follow-up on trend  Afebrile, still refusing labs, will ask neuropsychology to evaluate reason   16.  Hypokalemia  Potassium 4.1 on 4/3 after supplementation Recommend metabolic package to follow-up on potassium level  LOS: 25 days A FACE TO FACE EVALUATION WAS PERFORMED  Erick Colace 03/09/2019, 9:01 AM

## 2019-03-10 ENCOUNTER — Inpatient Hospital Stay (HOSPITAL_COMMUNITY): Payer: Self-pay | Admitting: Occupational Therapy

## 2019-03-10 DIAGNOSIS — D72823 Leukemoid reaction: Secondary | ICD-10-CM

## 2019-03-10 MED ORDER — POTASSIUM CHLORIDE CRYS ER 20 MEQ PO TBCR
40.0000 meq | EXTENDED_RELEASE_TABLET | Freq: Every day | ORAL | Status: DC
  Administered 2019-03-11 – 2019-03-13 (×3): 40 meq via ORAL
  Filled 2019-03-10 (×3): qty 2

## 2019-03-10 NOTE — Progress Notes (Signed)
Occupational Therapy Session Note  Patient Details  Name: LEVORN OLESKI MRN: [id number] Date of Birth: 05-04-1951  Today's Date: 03/10/2019 OT Individual Time: 1420-1534 OT Individual Time Calculation (min): 74 min   Short Term Goals: Week 2:  OT Short Term Goal 1 (Week 2): Pt will perform slide board transfer with max A consistently.  OT Short Term Goal 1 - Progress (Week 2): Progressing toward goal OT Short Term Goal 2 (Week 2): Pt will sit EOB with min A for sitting balance for 5 minutes during self care tasks.  OT Short Term Goal 2 - Progress (Week 2): Met OT Short Term Goal 3 (Week 2): Pt will perform UB dressing with mod A . OT Short Term Goal 3 - Progress (Week 2): Met  Skilled Therapeutic Interventions/Progress Updates:    Pt greeted in w/c. L LE and rib pain reported to be manageable without RN interventions. Worked on lateral leans and slideboard transfers in prep for 2nd family education session on Monday. Pt completed slideboard transfer<bed<BSC<bed<w/c with Min A. Able to remove slideboard post transfer himself x2! While EOB, pt able to scoot towards Rt side with supervision and increased time. Used blue theraband to simulate pants, and pt leaned Lt>Rt onto elbows with Mod A while OT elevated and lowered band x3 sets. We also practiced this while sitting on BSC, using bed and chair for supportive leaning surfaces. When he returned to the w/c, provided him with an UE HEP handout. We reviewed techniques during session using green theraband x5 reps 2 sets. He also completed 10 w/c push ups with instruction on technique for isolated tricep strengthening as well. At end of tx pt was left in w/c with all needs within reach and safety belt fastened.   Therapy Documentation Precautions:  Precautions Precautions: Fall Restrictions Weight Bearing Restrictions: No Vital Signs: Therapy Vitals Temp: 98.6 F (37 C) Temp Source: Oral Pulse Rate: 61 Resp: 20 BP: 99/67 Patient Position  (if appropriate): Sitting Oxygen Therapy SpO2: 100 % O2 Device: Room Air ADL: ADL Upper Body Bathing: Maximal assistance Where Assessed-Upper Body Bathing: Bed level Lower Body Bathing: Dependent Where Assessed-Lower Body Bathing: Bed level Upper Body Dressing: Maximal assistance Where Assessed-Upper Body Dressing: Bed level Lower Body Dressing: Dependent Where Assessed-Lower Body Dressing: Bed level:     Therapy/Group: Individual Therapy  Aarion Kittrell A Durrel Mcnee 03/10/2019, 3:55 PM

## 2019-03-10 NOTE — Plan of Care (Signed)
  Problem: Consults Goal: RH STROKE PATIENT EDUCATION Description See Patient Education module for education specifics  Outcome: Progressing   Problem: RH BOWEL ELIMINATION Goal: RH STG MANAGE BOWEL WITH ASSISTANCE Description STG Manage Bowel with max Assistance.  Outcome: Progressing   Problem: RH SKIN INTEGRITY Goal: RH STG SKIN FREE OF INFECTION/BREAKDOWN Description Maintain with min assist  Outcome: Progressing Goal: RH STG MAINTAIN SKIN INTEGRITY WITH ASSISTANCE Description STG Maintain Skin Integrity With min Assistance.  Outcome: Progressing   Problem: RH SAFETY Goal: RH STG ADHERE TO SAFETY PRECAUTIONS W/ASSISTANCE/DEVICE Description STG Adhere to Safety Precautions With cues/reminders Assistance/Device.  Outcome: Progressing   Problem: RH PAIN MANAGEMENT Goal: RH STG PAIN MANAGED AT OR BELOW PT'S PAIN GOAL Description At or below level 4  Outcome: Progressing   

## 2019-03-10 NOTE — Progress Notes (Signed)
[place] PHYSICAL MEDICINE & REHABILITATION PROGRESS NOTE   Subjective/Complaints:  Getting up with techs. No new complaints.   ROS: Patient denies fever, rash, sore throat, blurred vision, nausea, vomiting, diarrhea, cough, shortness of breath or chest pain, joint or back pain, headache, or mood change.   Objective:   No results found. Recent Labs    03/09/19 1138  WBC 10.9*  HGB 8.9*  HCT 28.3*  PLT 234   Recent Labs    03/09/19 1138  NA 136  K 4.8  CL 110  CO2 17*  GLUCOSE 105*  BUN 13  CREATININE 0.83  CALCIUM 8.7*    Intake/Output Summary (Last 24 hours) at 03/10/2019 1104 Last data filed at 03/10/2019 0900 Gross per 24 hour  Intake 720 ml  Output 550 ml  Net 170 ml     Physical Exam: Vital Signs Blood pressure 101/72, pulse (!) 53, temperature 98 F (36.7 C), temperature source Oral, resp. rate 16, height 6\' 1"  (1.854 m), weight 104.4 kg, SpO2 100 %. Constitutional: No distress . Vital signs reviewed. HEENT: EOMI, oral membranes moist Neck: supple Cardiovascular: RRR without murmur. No JVD    Respiratory: CTA Bilaterally without wheezes or rales. Normal effort    GI: BS +, non-tender, non-distended  Musc: Right-sided edema with RUE with kinesiotaping, right knee non-tender.  Neurologic: Alert and oriented Motor:  RUE: 3+/5 proximal distal, stable RLE: 2/5 proximal to distal, stable, LLE 3- Significant trunk weakness which affects sit to stand transfer Skin: No evidence of breakdown, no evidence of rash Psych:  pleasant.   Assessment/Plan: 1. Functional deficits secondary to cardioembolic bicerebral infarcts which require 3+ hours per day of interdisciplinary therapy in a comprehensive inpatient rehab setting.  Physiatrist is providing close team supervision and 24 hour management of active medical problems listed below.  Physiatrist and rehab team continue to assess barriers to discharge/monitor patient progress toward functional and medical  goals  Care Tool:  Bathing    Body parts bathed by patient: Chest, Abdomen, Left upper leg, Face, Right upper leg   Body parts bathed by helper: Front perineal area, Buttocks     Bathing assist Assist Level: 2 Helpers(bed level for perineal hygiene)     Upper Body Dressing/Undressing Upper body dressing Upper body dressing/undressing activity did not occur (including orthotics): Refused What is the patient wearing?: Pull over shirt    Upper body assist Assist Level: Set up assist    Lower Body Dressing/Undressing Lower body dressing      What is the patient wearing?: Pants     Lower body assist Assist for lower body dressing: Total Assistance - Patient < 25%     Toileting Toileting Toileting Activity did not occur Press photographer(Clothing management and hygiene only): Refused  Toileting assist Assist for toileting: 2 Helpers Assistive Device Comment: BSC   Transfers Chair/bed transfer  Transfers assist  Chair/bed transfer activity did not occur: Safety/medical concerns  Chair/bed transfer assist level: Minimal Assistance - Patient > 75%(lateral scoot )     Locomotion Ambulation   Ambulation assist   Ambulation activity did not occur: Refused  Assist level: Dependent - Patient 0%(maxi sky walking sling ) Assistive device: Maxi Sky Max distance: 263ft    Walk 10 feet activity   Assist  Walk 10 feet activity did not occur: Safety/medical concerns        Walk 50 feet activity   Assist Walk 50 feet with 2 turns activity did not occur: Safety/medical concerns  Walk 150 feet activity   Assist Walk 150 feet activity did not occur: Safety/medical concerns         Walk 10 feet on uneven surface  activity   Assist Walk 10 feet on uneven surfaces activity did not occur: Safety/medical concerns         Wheelchair     Assist Will patient use wheelchair at discharge?: Yes Type of Wheelchair: Manual    Wheelchair assist level: Contact  Guard/Touching assist Max wheelchair distance: 174ft    Wheelchair 50 feet with 2 turns activity    Assist    Wheelchair 50 feet with 2 turns activity did not occur: Safety/medical concerns(limited by fatigue)   Assist Level: Contact Guard/Touching assist   Wheelchair 150 feet activity     Assist Wheelchair 150 feet activity did not occur: Safety/medical concerns          Medical Problem List and Plan: 1.Persistent right side weakness with decreased mobilitysecondary to cardioembolic bi-cerebral infarction  Continue CIR PT OT speech   Wife is in for training. Dc this coming week  2. Antithrombotics: -DVT/anticoagulation:ELIQUIS -antiplatelet therapy:   3. Pain Management:Ultracet as needed, knee pain with weightbearing may be meniscal certainly no evidence of effusion 4. Mood:Provide emotional support  -appreciate neuropsych input re: coping with episodic stress/anxiety -antipsychotic agents: none 5. Neuropsych: This patientiscapable of making decisions on hisown behalf. 6. Skin/Wound Care:Routine skin checks 7. Fluids/Electrolytes/Nutrition:encourage PO 8. ABLA/ Upper GI bleed EGD 3. Follow-up GI services. Continue PPI -serial H/H's   -hgb 7.9 on 4/3--->8.9 4/17  -1u PRBC Wednesday- 4/1      10. Chronic atrial fibrillation with TAVR procedure September 2019 11. Non-STEMI. Thought secondary to thrombus status post cardiac catheterization. Continue eliquis 12. Acute on chronic diastolic congestive heart failure. Lasix 40 mg daily  Filed Weights   03/08/19 0500 03/09/19 0500 03/10/19 0448  Weight: 101.6 kg 102 kg 104.4 kg   Weights have been inconsistent. Jumped 2.5 kg today  -appears clinically balanced from volume standpoint---  -continue to observe 13. Hypertension. Norvasc 5 mg daily, Lopressor 50 mg twice a day. Monitor with increased mobility Vitals:   03/09/19 2024 03/10/19 0448  BP: 111/78 101/72   Pulse: 70 (!) 53  Resp:  16  Temp:  98 F (36.7 C)  SpO2:  100%   controlled on 4/18, labs ok 14. Hyperlipidemia. Lipitor 15. Persistent leukocytosis:  WBCs 10.9 4/17  Afebrile,     16.  Hypokalemia  Potassium 4.8 4/17   -can back of kdur to daily  LOS: 26 days A FACE TO FACE EVALUATION WAS PERFORMED  Ranelle Oyster 03/10/2019, 11:04 AM

## 2019-03-11 ENCOUNTER — Inpatient Hospital Stay (HOSPITAL_COMMUNITY): Payer: Self-pay | Admitting: Physical Therapy

## 2019-03-11 NOTE — Progress Notes (Signed)
Physical Therapy Session Note  Patient Details  Name: Joel Rogers MRN: [id number] Date of Birth: 01/17/1951  Today's Date: 03/11/2019 PT Individual Time: 0900-0920 PT Individual Time Calculation (min): 20 min   Short Term Goals: Week 4:  PT Short Term Goal 1 (Week 4): STG=LTG due to ELOS  Skilled Therapeutic Interventions/Progress Updates: Pt presented in bed refusing therapy. PTA dicussed with pt unsuccessful family ed on Friday and beneficial to use this opportunity to practice transfers. Pt only agreeable to transfer to w/c. Pt performed supine to sit with use of bed features and increased time pt able to instruct PTA in SB placement and pt performed SB transfer to L with CGA and increased time. Pt set up in w/c and remained in w/c at end of session with call bell within reach and needs met.      Therapy Documentation Precautions:  Precautions Precautions: Fall Restrictions Weight Bearing Restrictions: No General: PT Amount of Missed Time (min): 55 Minutes PT Missed Treatment Reason: Patient unwilling to participate Vital Signs:   Pain: Pain Assessment Pain Scale: 0-10 Pain Score: 0-No pain Mobility:   Locomotion :    Trunk/Postural Assessment :    Balance:   Exercises:   Other Treatments:      Therapy/Group: Individual Therapy  Redonna Wilbert  Albin Duckett, PTA  03/11/2019, 12:05 PM

## 2019-03-11 NOTE — Plan of Care (Signed)
  Problem: Consults Goal: RH STROKE PATIENT EDUCATION Description See Patient Education module for education specifics  Outcome: Progressing   Problem: RH BOWEL ELIMINATION Goal: RH STG MANAGE BOWEL WITH ASSISTANCE Description STG Manage Bowel with max Assistance.  Outcome: Progressing   Problem: RH SKIN INTEGRITY Goal: RH STG SKIN FREE OF INFECTION/BREAKDOWN Description Maintain with min assist  Outcome: Progressing Goal: RH STG MAINTAIN SKIN INTEGRITY WITH ASSISTANCE Description STG Maintain Skin Integrity With min Assistance.  Outcome: Progressing   Problem: RH SAFETY Goal: RH STG ADHERE TO SAFETY PRECAUTIONS W/ASSISTANCE/DEVICE Description STG Adhere to Safety Precautions With cues/reminders Assistance/Device.  Outcome: Progressing   Problem: RH PAIN MANAGEMENT Goal: RH STG PAIN MANAGED AT OR BELOW PT'S PAIN GOAL Description At or below level 4  Outcome: Progressing

## 2019-03-11 NOTE — Progress Notes (Signed)
[place] PHYSICAL MEDICINE & REHABILITATION PROGRESS NOTE   Subjective/Complaints:  Felt that he had a good day with therapy yesterday. Motivated to get home  ROS: Patient denies fever, rash, sore throat, blurred vision, nausea, vomiting, diarrhea, cough, shortness of breath or chest pain, joint or back pain, headache, or mood change.    Objective:   No results found. Recent Labs    03/09/19 1138  WBC 10.9*  HGB 8.9*  HCT 28.3*  PLT 234   Recent Labs    03/09/19 1138  NA 136  K 4.8  CL 110  CO2 17*  GLUCOSE 105*  BUN 13  CREATININE 0.83  CALCIUM 8.7*    Intake/Output Summary (Last 24 hours) at 03/11/2019 1054 Last data filed at 03/11/2019 1052 Gross per 24 hour  Intake 480 ml  Output 1200 ml  Net -720 ml     Physical Exam: Vital Signs Blood pressure 110/72, pulse 62, temperature 98.5 F (36.9 C), temperature source Oral, resp. rate 16, height 6\' 1"  (1.854 m), weight 99.4 kg, SpO2 100 %. Constitutional: No distress . Vital signs reviewed. HEENT: EOMI, oral membranes moist Neck: supple Cardiovascular: RRR without murmur. No JVD    Respiratory: CTA Bilaterally without wheezes or rales. Normal effort    GI: BS +, non-tender, non-distended  Musc: Right-sided edema with RUE with kinesiotaping, right knee non-tender.  Neurologic: Alert and oriented Motor:  RUE: 3+/5 proximal distal, stable RLE: 2/5 proximal to distal, stable, LLE 3- Significant trunk weakness which affects sit to stand transfer Skin: No evidence of breakdown, no evidence of rash Psych:  pleasant.   Assessment/Plan: 1. Functional deficits secondary to cardioembolic bicerebral infarcts which require 3+ hours per day of interdisciplinary therapy in a comprehensive inpatient rehab setting.  Physiatrist is providing close team supervision and 24 hour management of active medical problems listed below.  Physiatrist and rehab team continue to assess barriers to discharge/monitor patient progress  toward functional and medical goals  Care Tool:  Bathing    Body parts bathed by patient: Chest, Abdomen, Left upper leg, Face, Right upper leg   Body parts bathed by helper: Front perineal area, Buttocks     Bathing assist Assist Level: 2 Helpers(bed level for perineal hygiene)     Upper Body Dressing/Undressing Upper body dressing Upper body dressing/undressing activity did not occur (including orthotics): Refused What is the patient wearing?: Pull over shirt    Upper body assist Assist Level: Set up assist    Lower Body Dressing/Undressing Lower body dressing      What is the patient wearing?: Pants     Lower body assist Assist for lower body dressing: Total Assistance - Patient < 25%     Toileting Toileting Toileting Activity did not occur Press photographer(Clothing management and hygiene only): Refused  Toileting assist Assist for toileting: 2 Helpers Assistive Device Comment: BSC   Transfers Chair/bed transfer  Transfers assist  Chair/bed transfer activity did not occur: Safety/medical concerns  Chair/bed transfer assist level: Minimal Assistance - Patient > 75%(lateral scoot )     Locomotion Ambulation   Ambulation assist   Ambulation activity did not occur: Refused  Assist level: Dependent - Patient 0%(maxi sky walking sling ) Assistive device: Maxi Sky Max distance: 333ft    Walk 10 feet activity   Assist  Walk 10 feet activity did not occur: Safety/medical concerns        Walk 50 feet activity   Assist Walk 50 feet with 2 turns activity did not occur:  Safety/medical concerns         Walk 150 feet activity   Assist Walk 150 feet activity did not occur: Safety/medical concerns         Walk 10 feet on uneven surface  activity   Assist Walk 10 feet on uneven surfaces activity did not occur: Safety/medical concerns         Wheelchair     Assist Will patient use wheelchair at discharge?: Yes Type of Wheelchair: Manual     Wheelchair assist level: Contact Guard/Touching assist Max wheelchair distance: 112ft    Wheelchair 50 feet with 2 turns activity    Assist    Wheelchair 50 feet with 2 turns activity did not occur: Safety/medical concerns(limited by fatigue)   Assist Level: Contact Guard/Touching assist   Wheelchair 150 feet activity     Assist Wheelchair 150 feet activity did not occur: Safety/medical concerns          Medical Problem List and Plan: 1.Persistent right side weakness with decreased mobilitysecondary to cardioembolic bi-cerebral infarction  Continue CIR PT OT speech  Family ed. Dc this   week  2. Antithrombotics: -DVT/anticoagulation:ELIQUIS -antiplatelet therapy:   3. Pain Management:Ultracet as needed, knee pain with weightbearing may be meniscal certainly no evidence of effusion 4. Mood:Provide emotional support  -appreciate neuropsych input re: coping with episodic stress/anxiety -antipsychotic agents: none 5. Neuropsych: This patientiscapable of making decisions on hisown behalf. 6. Skin/Wound Care:Routine skin checks 7. Fluids/Electrolytes/Nutrition:encourage PO 8. ABLA/ Upper GI bleed EGD 3. Follow-up GI services. Continue PPI -serial H/H's   -hgb 7.9 on 4/3--->8.9 4/17  -1u PRBC Wednesday- 4/1      10. Chronic atrial fibrillation with TAVR procedure September 2019 11. Non-STEMI. Thought secondary to thrombus status post cardiac catheterization. Continue eliquis 12. Acute on chronic diastolic congestive heart failure. Lasix 40 mg daily  Filed Weights   03/09/19 0500 03/10/19 0448 03/11/19 0531  Weight: 102 kg 104.4 kg 99.4 kg   Weights not being recorded consistently  -appears clinically balanced from volume standpoint---  -continue to observe 13. Hypertension. Norvasc 5 mg daily, Lopressor 50 mg twice a day. Monitor with increased mobility Vitals:   03/10/19 2110 03/11/19 0531  BP: 122/82  110/72  Pulse: 72 62  Resp:  16  Temp:  98.5 F (36.9 C)  SpO2:  100%   controlled on 4/19 14. Hyperlipidemia. Lipitor 15. Persistent leukocytosis:  WBCs 10.9 4/17  Afebrile,     16.  Hypokalemia  Potassium 4.8 4/17   -decreased kdur to daily  LOS: 27 days A FACE TO FACE EVALUATION WAS PERFORMED  Ranelle Oyster 03/11/2019, 10:54 AM

## 2019-03-12 ENCOUNTER — Inpatient Hospital Stay (HOSPITAL_COMMUNITY): Payer: Self-pay | Admitting: Physical Therapy

## 2019-03-12 ENCOUNTER — Inpatient Hospital Stay (HOSPITAL_COMMUNITY): Payer: Self-pay | Admitting: Speech Pathology

## 2019-03-12 ENCOUNTER — Inpatient Hospital Stay (HOSPITAL_COMMUNITY): Payer: Self-pay | Admitting: Occupational Therapy

## 2019-03-12 NOTE — Significant Event (Signed)
Pt continues to refuse prostat. Pt educated on medication, education ineffective.

## 2019-03-12 NOTE — Progress Notes (Signed)
Physical Therapy Discharge Summary  Patient Details  Name: Joel Rogers MRN: [id number] Date of Birth: 19-Sep-1951  Today's Date: 03/12/2019    Patient has met 7 of 8 long term goals due to improved activity tolerance, improved balance, improved postural control, increased strength, improved attention, improved awareness and improved coordination.  Patient to discharge at a wheelchair level Ida.   Patient's care partner is independent to provide the necessary physical assistance at discharge.  Reasons goals not met: Limited endurance while in standing delayed progression of ambulation. Pt is able to stand pivot with min/modA pending fatigue levels.   Recommendation:  Patient will benefit from ongoing skilled PT services in home health setting to continue to advance safe functional mobility, address ongoing impairments in strength, transfer ability, gait, balance, bed mobility, functional activity tolerance, and to minimize fall risk.  Equipment: 20x20 w/c with extended leg rests, heavy duty RW, slide board, hospital bed  Reasons for discharge: treatment goals met  Patient/family agrees with progress made and goals achieved: Yes  PT Discharge Precautions/Restrictions   Vital Signs  Pain Pain Assessment Pain Scale: 0-10 Pain Score: 0-No pain Vision/Perception     Cognition Awake, alert, A&Ox4   Sensation  B LE light touch sensation intact, noted reduced light touch sensation in R hand    Motor  Motor Motor: Hemiplegia Motor - Skilled Clinical Observations: R hemiparesis, ongoing deconditioning   Mobility Bed Mobility Bed Mobility: Supine to Sit;Rolling Right;Rolling Left;Sit to Supine Rolling Right: Minimal Assistance - Patient > 75% Rolling Left: Minimal Assistance - Patient > 75% Supine to Sit: Minimal Assistance - Patient > 75% Sitting - Scoot to Edge of Bed: Supervision/Verbal cueing Sit to Supine: Minimal Assistance - Patient > 75% Transfers Transfers:  Lateral/Scoot Transfers Sit to Stand: 2 Helpers Lateral/Scoot Transfers: Contact Guard/Touching assist;Minimal Assistance - Patient > 75% Transfer (Assistive device): Other (Comment)(slideboard) Locomotion  Gait Ambulation: No Gait Gait: No Stairs / Additional Locomotion Stairs: No Wheelchair Mobility Wheelchair Mobility: Yes Wheelchair Assistance: Chartered loss adjuster: Both upper extremities Wheelchair Parts Management: Supervision/cueing Distance: 141f  Trunk/Postural Assessment  Cervical Assessment Cervical Assessment: Exceptions to WFL(forward head) Thoracic Assessment Thoracic Assessment: Exceptions to WFL(kyphotic) Lumbar Assessment Lumbar Assessment: Exceptions to WFL(posterior pelvic tilt) Postural Control Postural Control: Deficits on evaluation  Balance Balance Balance Assessed: Yes Static Sitting Balance Static Sitting - Balance Support: Feet supported Static Sitting - Level of Assistance: 5: Stand by assistance Dynamic Sitting Balance Dynamic Sitting - Balance Support: Feet supported Dynamic Sitting - Level of Assistance: 5: Stand by assistance Sitting balance - Comments: supervision Static Standing Balance Static Standing - Balance Support: During functional activity;Bilateral upper extremity supported Static Standing - Level of Assistance: 5: Stand by assistance(min guard ) Dynamic Standing Balance Dynamic Standing - Balance Support: Bilateral upper extremity supported;During functional activity Dynamic Standing - Level of Assistance: 3: Mod assist Extremity Assessment  RUE Assessment RUE Assessment: Not tested LUE Assessment LUE Assessment: Not tested RLE Assessment RLE Assessment: Exceptions to [facility] Strength Comments: hip flexion 2/5, knee extension 2+ to 3-/5, ankle DF 4/5, hip ABD 3/5 in sitting  LLE Assessment LLE Assessment: Within Functional Limits    KDeniece ReePT, DPT, CBIS  Supplemental Physical  Therapist [facility]   Pager [phone] Rehab Office [phone]

## 2019-03-12 NOTE — Discharge Summary (Signed)
Physician Discharge Summary  Patient ID: Joel Rogers MRN: [id number] DOB/AGE: [birth year] 68 y.o.  Admit date: 02/12/2019 Discharge date: 03/20/2019  Discharge Diagnoses:  Active Problems:   Posterior circulation stroke (HCC)   Hypokalemia   Leukocytosis   Uncontrolled stage 2 hypertension   Acute on chronic diastolic (congestive) heart failure (HCC)   Acute blood loss anemia chronic atrial fibrillation with TAVR procedure September 2019 Non-STEMI   Discharged Condition: stable  Significant Diagnostic Studies: No results found.  Labs:  Basic Metabolic Panel: Recent Labs  Lab 03/09/19 1138  NA 136  K 4.8  CL 110  CO2 17*  GLUCOSE 105*  BUN 13  CREATININE 0.83  CALCIUM 8.7*    CBC: Recent Labs  Lab 03/09/19 1138  WBC 10.9*  HGB 8.9*  HCT 28.3*  MCV 77.3*  PLT 234    CBG: No results for input(s): GLUCAP in the last 168 hours.  Family history. Father with hypertension and prostate cancer. Mother with Alzheimer's disease. Maternal grandfather with Alzheimer's disease. Sister with breast cancer. Brother with brain tumor. Denies any diabetes  Brief HPI:    Joel Rogers is a 68 year old right-handed male with history of diastolic congestive heart failure, aortic stenosis with TAVR procedure September 2019, atrial fibrillation, OSA with CPAP, hypertension. Per chart review patient lives with spouse community ambulator prior to admission. Initially presented to [facility]/13/2019 secondary to right upper extremity pain and recent fall with right sided weakness findings of sepsis due to Staphylococcus bacteremia and cellulitis during admission maintained on antibiotic therapy. Noted persistent right side weakness. MRI of the brain revealed multiple acute ischemic infarctions in a pattern most consistent with cardioembolic stroke. He was transferred to [facility] for further evaluation. Venous Dopplers negative for DVTs. Follow-up MRI of the brain  with numerous scattered foci of acute ischemia throughout the cerebellum and cerebrum. No midline shift or mass effect. No proximal intracranial arterial occlusion. Echocardiogram with ejection fraction of 60% and normal systolic function. TEE showed no vegetation on bioprosthetic aortic valve. Patient remained on Eliquis for atrial fibrillation with the addition of aspirin for CVA prophylaxis. He developed coffee-ground emesis with acute blood loss anemia requiring transfusion totaling 6 units pack red blood cells with gastroenterology consulted for EGD completed 2 with findings of red blood in the lower third of the esophagus clotted blood in the entire stomach. Blood thinners were held and follow-up GI endoscopy again completed 01/26/2019 showing no active bleeding or blood present. Recommendations were to hold aspirin and Eliquis for approximately 3-5 days and then resume. He was placed on Protonix twice daily. Hospital course AKI with creatinine baseline 1.1-1.23 elevated to 1.95 with renal services consulted likely secondary to ischemic ATN in the setting of sepsis maintained on gentle IV fluids and creatinine continued to improve. Therapy evaluations completed patient was admitted to inpatient rehabilitation services 01/30/2019. On 02/03/2019 patient with burning chest pain felt likely acid reflux he did receive a dose of Maalox. Denied shortness of breath. Patient became unresponsive code was called and noted to be in ventricular fibrillation. Troponin was noted be elevated. He was discharged to acute care services maintained on intravenous heparin and followed by cardiology services. Echocardiogram was repeated with ejection fraction greater than 65% hyperdynamic systolic function. Cardiac catheterization same day showed Lat Ramus lesion 99% stenosed. Heavy thrombus. No other cardiac CAD. His eliquis was resumed and monitored for any bleeding episodes. His diet was slowly advanced. Patient was again  readmitted to inpatient  rehabilitation services to continue therapy program.  Hospital Course: Joel Rogers was admitted to rehab 02/12/2019 for inpatient therapies to consist of PT, ST and OT at least three hours five days a week. Past admission physiatrist, therapy team and rehab RN have worked together to provide customized collaborative inpatient rehab. Pertaining to [name] persistent right side weakness secondary to cardioembolic infarction remained stable he continued on eliquis for both CVA prophylaxis as well as atrial fibrillation. Follow-up per neurology services as well as cardiology services. Cardiac rate remained controlled. No further chest pain or shortness of breath. Acute blood loss anemia upper GI bleed EGD 3 follow-up per GI services. Continue on PPI. Latest hemoglobin 8.9. lipid pressures remained controlled he continued on Lopressor 50 mg twice daily as well as Lasix with no signs of fluid overload. Patient would follow-up with PCP as well as cardiology services. Pain management with the use of Ultracet for knee pain with good results. He continued on Lipitor for hyperlipidemia.  Physical exam. Blood pressure 127/82 pulse 78 temp 98.3 respirations 16 oxygen saturations 100% room air Constitutional. Oriented to person place and time with no distress HEENT Head. Normocephalic and atraumatic Eyes. Pupils round and reactive to light without discharge EOMs normal without nystagmus Neck. Supple nontender normal range of motion no JVD without bruit Cardiac. Irregular irregular Respiratory. Effort normal breath sounds normal no respiratory distress without wheezes GI.Soft exhibits no distention no abdominal tenderness Musculoskeletal Comments. Right upper extremity 1+ edema. Chronic pitting edema bilateral lower extremity right more than left Neurological. Alert and oriented reasonable insight and awareness. Right upper extremity 3+ to 4 minus out of 5 proximal to distal. Left  upper extremity 4 out of 5. Right lower extremity 1-2 out of 5. Left lower extremity 1-2 out of 5 proximal to distal.  Rehab course: During patient's stay in rehab weekly team conferences were held to monitor patient's progress, set goals and discuss barriers to discharge. At admission, patient required moderate assist for rolling in bed, max assist for supine to sit and sit to supine. Max assist sit to stand with physical assistance. Max assist general transfers. Minimal assist upper body bathing, moderate assist lower body bathing, moderate assist upper body dressing and total assist lower body dressing. Maximal assistance for toilet transfers.  He/She  has had improvement in activity tolerance, balance, postural control as well as ability to compensate for deficits. He/She has had improvement in functional use RUE/LUE  and RLE/LLE as well as improvement in awareness. Working with energy conservation techniques. Patient performed supine to sit with the use of bed features and increased time perform sliding board transfers to the left with contact guard assist. Patient can transport himself by wheelchair to the therapy gym. Able to perform multiple stands from wheelchair level with heavy moderate assistance and minimal guard 1 for safety. Patient able to stand for short periods of time approximately 1-2 minutes. ADLs. Patient completed slide board transfers bed to bedside commode to wheelchair with minimal assistance. Able to remove sliding board post transfer himself. Patient did need some encouragement to participate Therapy evaluations completed and patient was discharged to home.       Disposition:  discharge to home   Diet: regular  Special Instructions: no smoking driving or alcohol  Medications at discharge 1.Norvasc 5 mg by mouth daily 2. Eliquis 5 mg by mouth twice a day 3. Lipitor 80 mg by mouth daily 4. Lasix 40 mg by mouth daily 5. Lopressor 50 mg by mouth  twice a day 6.  Nitroglycerin as needed chest pain 7. Protonix 40 mg by mouth twice a day 8. Potassium chloride 40 mEq by mouth daily 9. Ultracet 1-2 tablets every 6 hours as needed pain  Follow-ups. Dr. Bernette Redbirdobert Buccini of gastroenterology services call for appointment. Dr. Nanetta BattyJonathan Berry cardiology services call for appointment on discharge.  Discharge Instructions    Ambulatory referral to Neurology   Complete by:  As directed    An appointment is requested in approximately follow-up 4-6 weeks cardioembolic infarction   Ambulatory referral to Physical Medicine Rehab   Complete by:  As directed    Moderate complexity follow-up 1-2 weeks cardioembolic infarction      Follow-up Information    Kirsteins, Victorino SparrowAndrew E, MD Follow up.   Specialty:  Physical Medicine and Rehabilitation Why:  Office to call for appointment Contact information: [address] [place] [address] [phone]        Bernette RedbirdBuccini, Robert, MD Follow up.   Specialty:  Gastroenterology Why:  call for appointment Contact information: [address] [place] [address] [phone]        Runell GessBerry, Jonathan J, MD Follow up.   Specialties:  Cardiology, Radiology Why:  call for appointment 2 weeks Contact information: [address] [place] [address] [phone]           Signed: Mcarthur RossettiDaniel J Angiulli 03/12/2019, 5:41 AM

## 2019-03-12 NOTE — Progress Notes (Addendum)
Social Work  Discharge Note  The overall goal for the admission was met for:   Discharge location: Yes-HOME WITH WIFE WHO CAN PROVIDE 24 HR CARE  Length of Stay: Yes-28 DAYS  Discharge activity level: Yes-MIN ASSIST WHEELCHAIR LEVEL  Home/community participation: Yes  Services provided included: MD, RD, PT, OT, SLP, RN, CM, TR, Pharmacy, Neuropsych and SW  Financial Services: Private Insurance: [facility]  Follow-up services arranged: Home Health: [facility]-PT,OT,RN, DME: [facility], WHEELCHAIR, 30 [address] BOARD, [place] and Patient/Family has no preference for HH/DME agencies  Comments (or additional information):JACKIE WAS HERE FOR TWO DAYS STRAIGHT [place]. EQUIPMENT TO BE DELIVERED TODAY AND WILL BE READY FOR DC TOMORROW. DISCUSSED NHP AND BOTH PT AND WIFE REFUSED THIS OPTION, FEEL HE IS AT HIGH RISK TO [place] TO [place]  Patient/Family verbalized understanding of follow-up arrangements: Yes  Individual responsible for coordination of the follow-up plan: JACKIE-WIFE  Confirmed correct DME delivered: Elease Hashimoto 03/12/2019    Elease Hashimoto

## 2019-03-12 NOTE — Progress Notes (Signed)
Speech Language Pathology Discharge Summary  Patient Details  Name: REMIJIO HOLLERAN MRN: [id number] Date of Birth: 1951/10/16  Today's Date: 03/12/2019 SLP Individual Time: 1115-1200 SLP Individual Time Calculation (min): 45 min   Skilled Therapeutic Interventions:  Skilled treatment session focused on completing caregiver education with pt's wife. SLP reviewed helpful compensatory strategies to utilize within home environment. Education completed on active supervision of pt d/t pt's continued decreased insight into deficits. Plan established for support within home. All questions answered to pt and wife's satisfaction.      Patient has met 4 of 4 long term goals.  Patient to discharge at [facility] level.    Clinical Impression/Discharge Summary:   Pt has made progress during skilled ST sessions and as a result he is discharging at [facility] for all semi-complex cognitive functioning. Pt continues to demonstrate decreased insight into physical deficits as evidenced by refusing PT on previous date. Continued education provided and offered to support pt in growth and safety.    Care Partner:  Caregiver Able to Provide Assistance: Yes  Type of Caregiver Assistance: Physical;Cognitive  Recommendation:  24 hour supervision/assistance      Reasons for discharge: Discharged from hospital   Patient/Family Agrees with Progress Made and Goals Achieved: Yes    Druanne Bosques 03/12/2019, 12:05 PM

## 2019-03-12 NOTE — Progress Notes (Signed)
Physical Therapy Session Note  Patient Details   Name: Joel Rogers MRN: [id number] Date of Birth: October 02, 1951  Today's Date: 03/12/2019 PT Individual Time: 1430-1530 PT Individual Time Calculation (min): 60 min   Short Term Goals: Week 4:  PT Short Term Goal 1 (Week 4): STG=LTG due to ELOS  Skilled Therapeutic Interventions/Progress Updates:    Patient received up in [facility], pleasant and willing to participate in therapy; wife present and required encouragement for more practice of sliding board transfers, stating "I'm waiting on a hospital bed and RW and WC". Patient able to self-propel WC approximately 90f with B UEs and S, then [facility] Supervised patient and wife performing additional sliding board transfers, patient able to self-direct wife somewhat but required education and guidance and providing wife with step by step directions. Able to perform sliding board transfers to and from mat table with S from PT and MinA from wife to stabilize [place] totalA for placement of board by wife. Cues provided for sequencing and head-hips relationship of board, sequencing for transfer and hand placement. Otherwise further practiced sit to stand transfers, patient with very little carryover from previous sessions and required Max fading to Mod cues for sequencing and MaxAx1/Min guard x1 for safe sit to stand transfers with RW. Patient with great difficulty in forward head-hips translation for sit to stands. [facility] chair with seat belt alarm active, all other needs met this morning.   Therapy Documentation Precautions:  Precautions Precautions: Fall Restrictions Weight Bearing Restrictions: No General:   Vital Signs:  Pain: Pain Assessment Pain Scale: 0-10 Pain Score: 0-No pain     Therapy/Group: Individual Therapy   KDeniece ReePT, DPT, CBIS  Supplemental Physical Therapist [facility]   Pager  [phone] Rehab Office [phone]   03/12/2019, 4:12 PM

## 2019-03-12 NOTE — Progress Notes (Signed)
Occupational Therapy Discharge Summary  Patient Details  Name: Joel Rogers MRN: [id number] Date of Birth: 10-Aug-1951  Today's Date: 03/12/2019 OT Individual Time: 1540-0867 OT Individual Time Calculation (min): 45 min    Patient has met 9 of 12 long term goals due to improved activity tolerance, improved balance, ability to compensate for deficits, functional use of  RIGHT upper and RIGHT lower extremity, improved awareness and improved coordination.  Patient to discharge at overall min A for slide board transfers, Max A for LB self care, and supervision for UB self care level.  Patient's care partner is independent to provide the necessary physical and cognitive assistance at discharge.  Pt's wife, Joel Rogers, participated in hands on family education on 4/17 and 4/20. OT expressed concerns with endurance at home and Joel Rogers having health concerns of her own. Joel Rogers was unable to place slide board on 4/17 secondary to low blood sugar per her report but able to complete education today without issue.   Reasons goals not met: LB dressing needing max A and second helper needed for sit <>stand activities  Recommendation:  Patient will benefit from ongoing skilled OT services in home health setting to continue to advance functional skills in the area of BADL and Reduce care partner burden.  Equipment: drop arm commode chair, slide board, and hospital bed  Reasons for discharge: treatment goals met  Patient/family agrees with progress made and goals achieved: Yes   OT Intervention: Upon entering the room, pt supine in bed with no c/o pain. OT expressed concerns regarding discharge with how family education was not very successful last week. Pt verbalized, " We will be able to do it today. She just didn't feel good ." Caregiver arriving 30 minutes late to this session. OT assisting pt with dressing and seated on EOB to transfer when she arrived. Pt and wife demonstrated slide board transfer from bed  <> wheelchair x 2 with min cuing for proper technique. OT discussed using bed functions to create even transfer and for good body mechanics. Pt transitioned to PT session without issue.     OT Discharge Precautions/Restrictions  Precautions Precautions: Fall Pain Pain Assessment Pain Scale: 0-10 Pain Score: 0-No pain ADL ADL Upper Body Bathing: Maximal assistance Where Assessed-Upper Body Bathing: Bed level Lower Body Bathing: Dependent Where Assessed-Lower Body Bathing: Bed level Upper Body Dressing: Maximal assistance Where Assessed-Upper Body Dressing: Bed level Lower Body Dressing: Dependent Where Assessed-Lower Body Dressing: Bed level Vision Baseline Vision/History: Wears glasses Wears Glasses: Distance only Patient Visual Report: No change from baseline Cognition Overall Cognitive Status: Within Functional Limits for tasks assessed Arousal/Alertness: Awake/alert Orientation Level: Oriented X4 Safety/Judgment: Impaired Comments: no insight into physical deficits and impact on mobility/transfers or safety Sensation Sensation Light Touch: Appears Intact Proprioception: Appears Intact Coordination Gross Motor Movements are Fluid and Coordinated: No Fine Motor Movements are Fluid and Coordinated: No Coordination and Movement Description: R hemiplegia but has improved since initial evaluation Mobility  Bed Mobility Sit to Supine: Minimal Assistance - Patient > 75%  Trunk/Postural Assessment  Cervical Assessment Cervical Assessment: Exceptions to WFL(forward head) Thoracic Assessment Thoracic Assessment: Exceptions to WFL(kyphotic) Lumbar Assessment Lumbar Assessment: Exceptions to WFL(posterior pelvic tilt)  Balance Balance Balance Assessed: Yes Static Sitting Balance Static Sitting - Balance Support: Feet supported;Right upper extremity supported Static Sitting - Level of Assistance: 5: Stand by assistance Dynamic Sitting Balance Sitting balance - Comments:  supervision Extremity/Trunk Assessment RUE Assessment RUE Assessment: Exceptions to [facility] Active Range of Motion (AROM) Comments: limited  shoulder ROM to 70 degrees of elevation . elbow, wrist, digits WFLs LUE Assessment LUE Assessment: Within Functional Limits   [name],  03/12/2019, 12:08 PM

## 2019-03-12 NOTE — Progress Notes (Signed)
Social Work Patient ID: Joel Rogers, male   DOB: 1951-05-08, 68 y.o.   MRN: [id number] Met with pt and wife who is here again for family education it is going better today and she is feeling much more comfortable with his care. Pt is in a good mood and ready to go home tomorrow. Equipment to be delivered today and home health arranged.

## 2019-03-12 NOTE — Progress Notes (Signed)
[name] PHYSICAL MEDICINE & REHABILITATION PROGRESS NOTE   Subjective/Complaints:  Wife is coming in for training today  ROS: Patient denies CP, SOB, N/V/D    Objective:   No results found. Recent Labs    03/09/19 1138  WBC 10.9*  HGB 8.9*  HCT 28.3*  PLT 234   Recent Labs    03/09/19 1138  NA 136  K 4.8  CL 110  CO2 17*  GLUCOSE 105*  BUN 13  CREATININE 0.83  CALCIUM 8.7*    Intake/Output Summary (Last 24 hours) at 03/12/2019 0843 Last data filed at 03/12/2019 0836 Gross per 24 hour  Intake 1318 ml  Output 1825 ml  Net -507 ml     Physical Exam: Vital Signs Blood pressure 106/70, pulse 62, temperature (!) 97.5 F (36.4 C), temperature source Oral, resp. rate 16, height 6\' 1"  (1.854 m), weight 104.1 kg, SpO2 100 %. Constitutional: No distress . Vital signs reviewed. HEENT: EOMI, oral membranes moist Neck: supple Cardiovascular: RRR without murmur. No JVD    Respiratory: CTA Bilaterally without wheezes or rales. Normal effort    GI: BS +, non-tender, non-distended  Musc: Right-sided edema with RUE with kinesiotaping, right knee non-tender.  Neurologic: Alert and oriented Motor:  RUE: 3+/5 proximal distal, stable RLE: 2/5 proximal to distal, stable, LLE 3- Significant trunk weakness which affects sit to stand transfer Skin: No evidence of breakdown, no evidence of rash Psych:  pleasant.   Assessment/Plan: 1. Functional deficits secondary to cardioembolic bicerebral infarcts which require 3+ hours per day of interdisciplinary therapy in a comprehensive inpatient rehab setting.  Physiatrist is providing close team supervision and 24 hour management of active medical problems listed below.  Physiatrist and rehab team continue to assess barriers to discharge/monitor patient progress toward functional and medical goals  Care Tool:  Bathing    Body parts bathed by patient: Chest, Abdomen, Left upper leg, Face, Right upper leg   Body parts bathed by  helper: Front perineal area, Buttocks     Bathing assist Assist Level: 2 Helpers(bed level for perineal hygiene)     Upper Body Dressing/Undressing Upper body dressing Upper body dressing/undressing activity did not occur (including orthotics): Refused What is the patient wearing?: Pull over shirt    Upper body assist Assist Level: Set up assist    Lower Body Dressing/Undressing Lower body dressing      What is the patient wearing?: Pants     Lower body assist Assist for lower body dressing: Total Assistance - Patient < 25%     Toileting Toileting Toileting Activity did not occur Press photographer and hygiene only): Refused  Toileting assist Assist for toileting: 2 Helpers Assistive Device Comment: BSC   Transfers Chair/bed transfer  Transfers assist  Chair/bed transfer activity did not occur: Safety/medical concerns  Chair/bed transfer assist level: Contact Guard/Touching assist(SB )     Locomotion Ambulation   Ambulation assist   Ambulation activity did not occur: Refused  Assist level: Dependent - Patient 0%(maxi sky walking sling ) Assistive device: Maxi Sky Max distance: 5ft    Walk 10 feet activity   Assist  Walk 10 feet activity did not occur: Safety/medical concerns        Walk 50 feet activity   Assist Walk 50 feet with 2 turns activity did not occur: Safety/medical concerns         Walk 150 feet activity   Assist Walk 150 feet activity did not occur: Safety/medical concerns  Walk 10 feet on uneven surface  activity   Assist Walk 10 feet on uneven surfaces activity did not occur: Safety/medical concerns         Wheelchair     Assist Will patient use wheelchair at discharge?: Yes Type of Wheelchair: Manual    Wheelchair assist level: Contact Guard/Touching assist Max wheelchair distance: [id number]    Wheelchair 50 feet with 2 turns activity    Assist    Wheelchair 50 feet with 2 turns activity did  not occur: Safety/medical concerns(limited by fatigue)   Assist Level: Contact Guard/Touching assist   Wheelchair 150 feet activity     Assist Wheelchair 150 feet activity did not occur: Safety/medical concerns          Medical Problem List and Plan: 1.Persistent right side weakness with decreased mobilitysecondary to cardioembolic bi-cerebral infarction  Continue CIR PT OT speech  Family ed. Dc in am  2. Antithrombotics: -DVT/anticoagulation:ELIQUIS -antiplatelet therapy:   3. Pain Management:Ultracet as needed, knee pain with weightbearing may be meniscal certainly no evidence of effusion 4. Mood:Provide emotional support  -appreciate neuropsych input re: coping with episodic stress/anxiety -antipsychotic agents: none 5. Neuropsych: This patientiscapable of making decisions on hisown behalf. 6. Skin/Wound Care:Routine skin checks 7. Fluids/Electrolytes/Nutrition:encourage PO 8. ABLA/ Upper GI bleed EGD 3. Follow-up GI services. Continue PPI -serial H/H's   -hgb 7.9 on 4/3--->8.9 4/17 improving discussed result with pt  -1u PRBC Wednesday- 4/1      10. Chronic atrial fibrillation with TAVR procedure September 2019 11. Non-STEMI. Thought secondary to thrombus status post cardiac catheterization. Continue eliquis 12. Acute on chronic diastolic congestive heart failure. Lasix 40 mg daily  Filed Weights   03/10/19 0448 03/11/19 0531 03/12/19 0404  Weight: 104.4 kg 99.4 kg 104.1 kg   Weights not being recorded consistently, clinically no sign of overload  -appears clinically balanced from volume standpoint---  -continue to observe 13. Hypertension. Norvasc 5 mg daily, Lopressor 50 mg twice a day. Monitor with increased mobility Vitals:   03/11/19 2003 03/12/19 0404  BP: 124/83 106/70  Pulse: 67 62  Resp:  16  Temp: 98.6 F (37 C) (!) 97.5 F (36.4 C)  SpO2: 100% 100%   controlled on 4/20  14. Hyperlipidemia.  Lipitor 15. Persistent leukocytosis:improving   WBCs 10.9 4/17  Afebrile,     16.  Hypokalemia- improving on supplementation   Potassium 4.8 4/17   -decreased kdur to daily  LOS: 28 days A FACE TO FACE EVALUATION WAS PERFORMED  Joel Rogers 03/12/2019, 8:43 AM

## 2019-03-12 NOTE — Progress Notes (Signed)
Physical Therapy Session Note  Patient Details  Name: Joel Rogers MRN: [id number] Date of Birth: 05-May-1951  Today's Date: 03/12/2019 PT Individual Time: 1030-1120 PT Individual Time Calculation (min): 50 min   Short Term Goals: Week 4:  PT Short Term Goal 1 (Week 4): STG=LTG due to ELOS  Skilled Therapeutic Interventions/Progress Updates: Pt presented in w/c with wife Kennyth Lose present agreeable to therapy. Session focused on car transfer and problem solving transfer as needed. Pt transported to ortho gym and discussed with wife set up and transfer. Kennyth Lose required x 2 attempts but was able to successfully place SB for transfer. Pt transferred to 19in height (approximation for car) and was able to transfer into car with minA for transfer and modA for BLE management. Pt required increased verbal cues for "uphill" transfer to w/c. Pt and wife were able to successfully perform transfer with Kennyth Lose blocking pt's L side to prevent him from sliding back down while scooting forward. Pt required verbal cues for hand placement advised that pt could use console to push from. Pt ultimately required minA and significantly increased time. Once pt in w/c Kennyth Lose was able to replace leg rests with increased time. Pt propelled 18f with supervision and transported remaining distance due to time contraints. Pt remained in w/c at end of session with wife present, call bell within reach and needs met.      Therapy Documentation Precautions:  Precautions Precautions: Fall Restrictions Weight Bearing Restrictions: No General:   Vital Signs:  Pain: Pain Assessment Pain Scale: 0-10 Pain Score: 0-No pain Mobility: Bed Mobility Sit to Supine: Minimal Assistance - Patient > 75% Transfers Lateral/Scoot Transfers: Minimal Assistance - Patient > 75% Transfer (Assistive device): Other (Comment)(slide board) Locomotion :    Trunk/Postural Assessment : Cervical Assessment Cervical Assessment: Exceptions to  WFL(forward head) Thoracic Assessment Thoracic Assessment: Exceptions to WFL(kyphotic) Lumbar Assessment Lumbar Assessment: Exceptions to WFL(posterior pelvic tilt)  Balance: Balance Balance Assessed: Yes Static Sitting Balance Static Sitting - Balance Support: Feet supported;Right upper extremity supported Static Sitting - Level of Assistance: 5: Stand by assistance Dynamic Sitting Balance Sitting balance - Comments: supervision Exercises:   Other Treatments:      Therapy/Group: Individual Therapy  Desteny Freeman  Jeweline Reif, PTA  03/12/2019, 2:00 PM

## 2019-03-13 ENCOUNTER — Inpatient Hospital Stay (HOSPITAL_COMMUNITY): Payer: Medicare Other

## 2019-03-13 ENCOUNTER — Emergency Department (HOSPITAL_COMMUNITY): Payer: Medicare Other

## 2019-03-13 ENCOUNTER — Inpatient Hospital Stay (HOSPITAL_COMMUNITY)
Admission: EM | Admit: 2019-03-13 | Discharge: 2019-03-23 | DRG: 314 | Disposition: E | Payer: Medicare Other | Attending: Pulmonary Disease | Admitting: Pulmonary Disease

## 2019-03-13 DIAGNOSIS — I739 Peripheral vascular disease, unspecified: Secondary | ICD-10-CM | POA: Diagnosis not present

## 2019-03-13 DIAGNOSIS — I2510 Atherosclerotic heart disease of native coronary artery without angina pectoris: Secondary | ICD-10-CM | POA: Diagnosis present

## 2019-03-13 DIAGNOSIS — Z9884 Bariatric surgery status: Secondary | ICD-10-CM

## 2019-03-13 DIAGNOSIS — Z8719 Personal history of other diseases of the digestive system: Secondary | ICD-10-CM | POA: Diagnosis not present

## 2019-03-13 DIAGNOSIS — E785 Hyperlipidemia, unspecified: Secondary | ICD-10-CM | POA: Diagnosis not present

## 2019-03-13 DIAGNOSIS — R401 Stupor: Secondary | ICD-10-CM | POA: Diagnosis not present

## 2019-03-13 DIAGNOSIS — J9690 Respiratory failure, unspecified, unspecified whether with hypoxia or hypercapnia: Secondary | ICD-10-CM

## 2019-03-13 DIAGNOSIS — E669 Obesity, unspecified: Secondary | ICD-10-CM | POA: Diagnosis present

## 2019-03-13 DIAGNOSIS — I469 Cardiac arrest, cause unspecified: Secondary | ICD-10-CM | POA: Diagnosis not present

## 2019-03-13 DIAGNOSIS — N179 Acute kidney failure, unspecified: Secondary | ICD-10-CM | POA: Diagnosis present

## 2019-03-13 DIAGNOSIS — G4733 Obstructive sleep apnea (adult) (pediatric): Secondary | ICD-10-CM | POA: Diagnosis not present

## 2019-03-13 DIAGNOSIS — I110 Hypertensive heart disease with heart failure: Secondary | ICD-10-CM | POA: Diagnosis present

## 2019-03-13 DIAGNOSIS — G253 Myoclonus: Secondary | ICD-10-CM | POA: Diagnosis not present

## 2019-03-13 DIAGNOSIS — E872 Acidosis: Secondary | ICD-10-CM | POA: Diagnosis not present

## 2019-03-13 DIAGNOSIS — R600 Localized edema: Secondary | ICD-10-CM

## 2019-03-13 DIAGNOSIS — Z8249 Family history of ischemic heart disease and other diseases of the circulatory system: Secondary | ICD-10-CM

## 2019-03-13 DIAGNOSIS — R402212 Coma scale, best verbal response, none, at arrival to emergency department: Secondary | ICD-10-CM | POA: Diagnosis present

## 2019-03-13 DIAGNOSIS — R402112 Coma scale, eyes open, never, at arrival to emergency department: Secondary | ICD-10-CM | POA: Diagnosis not present

## 2019-03-13 DIAGNOSIS — Q211 Atrial septal defect: Secondary | ICD-10-CM

## 2019-03-13 DIAGNOSIS — Z8673 Personal history of transient ischemic attack (TIA), and cerebral infarction without residual deficits: Secondary | ICD-10-CM

## 2019-03-13 DIAGNOSIS — D72829 Elevated white blood cell count, unspecified: Secondary | ICD-10-CM | POA: Diagnosis not present

## 2019-03-13 DIAGNOSIS — I4901 Ventricular fibrillation: Secondary | ICD-10-CM | POA: Diagnosis present

## 2019-03-13 DIAGNOSIS — Z820 Family history of epilepsy and other diseases of the nervous system: Secondary | ICD-10-CM

## 2019-03-13 DIAGNOSIS — J9600 Acute respiratory failure, unspecified whether with hypoxia or hypercapnia: Secondary | ICD-10-CM | POA: Diagnosis not present

## 2019-03-13 DIAGNOSIS — Z952 Presence of prosthetic heart valve: Secondary | ICD-10-CM | POA: Diagnosis not present

## 2019-03-13 DIAGNOSIS — I462 Cardiac arrest due to underlying cardiac condition: Secondary | ICD-10-CM | POA: Diagnosis present

## 2019-03-13 DIAGNOSIS — T82867A Thrombosis of cardiac prosthetic devices, implants and grafts, initial encounter: Secondary | ICD-10-CM | POA: Diagnosis not present

## 2019-03-13 DIAGNOSIS — I252 Old myocardial infarction: Secondary | ICD-10-CM

## 2019-03-13 DIAGNOSIS — R402312 Coma scale, best motor response, none, at arrival to emergency department: Secondary | ICD-10-CM | POA: Diagnosis not present

## 2019-03-13 DIAGNOSIS — I4821 Permanent atrial fibrillation: Secondary | ICD-10-CM | POA: Diagnosis present

## 2019-03-13 DIAGNOSIS — I6350 Cerebral infarction due to unspecified occlusion or stenosis of unspecified cerebral artery: Secondary | ICD-10-CM | POA: Diagnosis not present

## 2019-03-13 DIAGNOSIS — Z9911 Dependence on respirator [ventilator] status: Secondary | ICD-10-CM | POA: Diagnosis not present

## 2019-03-13 DIAGNOSIS — D62 Acute posthemorrhagic anemia: Secondary | ICD-10-CM | POA: Diagnosis not present

## 2019-03-13 DIAGNOSIS — Z7901 Long term (current) use of anticoagulants: Secondary | ICD-10-CM

## 2019-03-13 DIAGNOSIS — I313 Pericardial effusion (noninflammatory): Secondary | ICD-10-CM | POA: Diagnosis present

## 2019-03-13 DIAGNOSIS — I248 Other forms of acute ischemic heart disease: Secondary | ICD-10-CM | POA: Diagnosis not present

## 2019-03-13 DIAGNOSIS — G4730 Sleep apnea, unspecified: Secondary | ICD-10-CM | POA: Diagnosis not present

## 2019-03-13 DIAGNOSIS — Z66 Do not resuscitate: Secondary | ICD-10-CM | POA: Diagnosis not present

## 2019-03-13 DIAGNOSIS — Z79891 Long term (current) use of opiate analgesic: Secondary | ICD-10-CM

## 2019-03-13 DIAGNOSIS — I5033 Acute on chronic diastolic (congestive) heart failure: Secondary | ICD-10-CM | POA: Diagnosis not present

## 2019-03-13 DIAGNOSIS — Z803 Family history of malignant neoplasm of breast: Secondary | ICD-10-CM

## 2019-03-13 DIAGNOSIS — Z79899 Other long term (current) drug therapy: Secondary | ICD-10-CM

## 2019-03-13 DIAGNOSIS — Z515 Encounter for palliative care: Secondary | ICD-10-CM | POA: Diagnosis not present

## 2019-03-13 DIAGNOSIS — J9601 Acute respiratory failure with hypoxia: Secondary | ICD-10-CM

## 2019-03-13 DIAGNOSIS — Z6831 Body mass index (BMI) 31.0-31.9, adult: Secondary | ICD-10-CM

## 2019-03-13 DIAGNOSIS — Z87891 Personal history of nicotine dependence: Secondary | ICD-10-CM

## 2019-03-13 DIAGNOSIS — I4510 Unspecified right bundle-branch block: Secondary | ICD-10-CM | POA: Diagnosis present

## 2019-03-13 DIAGNOSIS — Z452 Encounter for adjustment and management of vascular access device: Secondary | ICD-10-CM | POA: Diagnosis not present

## 2019-03-13 DIAGNOSIS — Z96641 Presence of right artificial hip joint: Secondary | ICD-10-CM | POA: Diagnosis not present

## 2019-03-13 DIAGNOSIS — G931 Anoxic brain damage, not elsewhere classified: Secondary | ICD-10-CM | POA: Diagnosis present

## 2019-03-13 DIAGNOSIS — D649 Anemia, unspecified: Secondary | ICD-10-CM | POA: Diagnosis present

## 2019-03-13 LAB — CBC WITH DIFFERENTIAL/PLATELET
Abs Immature Granulocytes: 0.36 10*3/uL — ABNORMAL HIGH (ref 0.00–0.07)
Basophils Absolute: 0.1 10*3/uL (ref 0.0–0.1)
Basophils Relative: 1 %
Eosinophils Absolute: 0.1 10*3/uL (ref 0.0–0.5)
Eosinophils Relative: 1 %
HCT: 30.8 % — ABNORMAL LOW (ref 39.0–52.0)
Hemoglobin: 9 g/dL — ABNORMAL LOW (ref 13.0–17.0)
Immature Granulocytes: 3 %
Lymphocytes Relative: 23 %
Lymphs Abs: 3.1 10*3/uL (ref 0.7–4.0)
MCH: 24.9 pg — ABNORMAL LOW (ref 26.0–34.0)
MCHC: 29.2 g/dL — ABNORMAL LOW (ref 30.0–36.0)
MCV: 85.1 fL (ref 80.0–100.0)
Monocytes Absolute: 1.3 10*3/uL — ABNORMAL HIGH (ref 0.1–1.0)
Monocytes Relative: 9 %
Neutro Abs: 8.9 10*3/uL — ABNORMAL HIGH (ref 1.7–7.7)
Neutrophils Relative %: 63 %
Platelets: 172 10*3/uL (ref 150–400)
RBC: 3.62 MIL/uL — ABNORMAL LOW (ref 4.22–5.81)
RDW: 20.5 % — ABNORMAL HIGH (ref 11.5–15.5)
WBC: 13.9 10*3/uL — ABNORMAL HIGH (ref 4.0–10.5)
nRBC: 0.4 % — ABNORMAL HIGH (ref 0.0–0.2)

## 2019-03-13 LAB — COMPREHENSIVE METABOLIC PANEL
ALT: 21 U/L (ref 0–44)
ALT: 33 U/L (ref 0–44)
AST: 38 U/L (ref 15–41)
AST: 58 U/L — ABNORMAL HIGH (ref 15–41)
Albumin: 2.2 g/dL — ABNORMAL LOW (ref 3.5–5.0)
Albumin: 2.3 g/dL — ABNORMAL LOW (ref 3.5–5.0)
Alkaline Phosphatase: 120 U/L (ref 38–126)
Alkaline Phosphatase: 120 U/L (ref 38–126)
Anion gap: 18 — ABNORMAL HIGH (ref 5–15)
Anion gap: 10 (ref 5–15)
BUN: 12 mg/dL (ref 8–23)
BUN: 17 mg/dL (ref 8–23)
CO2: 10 mmol/L — ABNORMAL LOW (ref 22–32)
CO2: 17 mmol/L — ABNORMAL LOW (ref 22–32)
Calcium: 8.9 mg/dL (ref 8.9–10.3)
Calcium: 8.4 mg/dL — ABNORMAL LOW (ref 8.9–10.3)
Chloride: 108 mmol/L (ref 98–111)
Chloride: 110 mmol/L (ref 98–111)
Creatinine, Ser: 1.12 mg/dL (ref 0.61–1.24)
Creatinine, Ser: 1.26 mg/dL — ABNORMAL HIGH (ref 0.61–1.24)
GFR calc Af Amer: >60 mL/min (ref >60)
GFR calc Af Amer: >60 mL/min (ref >60)
GFR calc non Af Amer: >60 mL/min (ref >60)
GFR calc non Af Amer: 58 mL/min — ABNORMAL LOW (ref >60)
Glucose, Bld: 198 mg/dL — ABNORMAL HIGH (ref 70–99)
Glucose, Bld: 150 mg/dL — ABNORMAL HIGH (ref 70–99)
Potassium: 5 mmol/L (ref 3.5–5.1)
Potassium: 4.4 mmol/L (ref 3.5–5.1)
Sodium: 136 mmol/L (ref 135–145)
Sodium: 137 mmol/L (ref 135–145)
Total Bilirubin: 0.7 mg/dL (ref 0.3–1.2)
Total Bilirubin: 1.1 mg/dL (ref 0.3–1.2)
Total Protein: 6.6 g/dL (ref 6.5–8.1)
Total Protein: 6.6 g/dL (ref 6.5–8.1)

## 2019-03-13 LAB — POCT I-STAT 7, (LYTES, BLD GAS, ICA,H+H)
Acid-base deficit: 9 mmol/L — ABNORMAL HIGH (ref 0.0–2.0)
Acid-base deficit: 7 mmol/L — ABNORMAL HIGH (ref 0.0–2.0)
Bicarbonate: 16.8 mmol/L — ABNORMAL LOW (ref 20.0–28.0)
Bicarbonate: 16.4 mmol/L — ABNORMAL LOW (ref 20.0–28.0)
Calcium, Ion: 1.24 mmol/L (ref 1.15–1.40)
Calcium, Ion: 1.21 mmol/L (ref 1.15–1.40)
HCT: 25 % — ABNORMAL LOW (ref 39.0–52.0)
HCT: 25 % — ABNORMAL LOW (ref 39.0–52.0)
Hemoglobin: 8.5 g/dL — ABNORMAL LOW (ref 13.0–17.0)
Hemoglobin: 8.5 g/dL — ABNORMAL LOW (ref 13.0–17.0)
O2 Saturation: 100 %
O2 Saturation: 99 %
Patient temperature: 37.5
Potassium: 4.2 mmol/L (ref 3.5–5.1)
Potassium: 4.3 mmol/L (ref 3.5–5.1)
Sodium: 137 mmol/L (ref 135–145)
Sodium: 137 mmol/L (ref 135–145)
TCO2: 18 mmol/L — ABNORMAL LOW (ref 22–32)
TCO2: 17 mmol/L — ABNORMAL LOW (ref 22–32)
pCO2 arterial: 35.2 mmHg (ref 32.0–48.0)
pCO2 arterial: 24.2 mmHg — ABNORMAL LOW (ref 32.0–48.0)
pH, Arterial: 7.287 — ABNORMAL LOW (ref 7.350–7.450)
pH, Arterial: 7.44 (ref 7.350–7.450)
pO2, Arterial: 201 mmHg — ABNORMAL HIGH (ref 83.0–108.0)
pO2, Arterial: 118 mmHg — ABNORMAL HIGH (ref 83.0–108.0)

## 2019-03-13 LAB — MAGNESIUM
Magnesium: 1.9 mg/dL (ref 1.7–2.4)
Magnesium: 1.9 mg/dL (ref 1.7–2.4)

## 2019-03-13 LAB — PROTIME-INR
INR: 1.9 — ABNORMAL HIGH (ref 0.8–1.2)
INR: 1.5 — ABNORMAL HIGH (ref 0.8–1.2)
Prothrombin Time: 21.4 seconds — ABNORMAL HIGH (ref 11.4–15.2)
Prothrombin Time: 17.7 seconds — ABNORMAL HIGH (ref 11.4–15.2)

## 2019-03-13 LAB — URINALYSIS, ROUTINE W REFLEX MICROSCOPIC
Bilirubin Urine: NEGATIVE
Glucose, UA: NEGATIVE mg/dL
Ketones, ur: NEGATIVE mg/dL
Leukocytes,Ua: NEGATIVE
Nitrite: NEGATIVE
Protein, ur: 100 mg/dL — AB
Specific Gravity, Urine: 1.01 (ref 1.005–1.030)
pH: 5 (ref 5.0–8.0)

## 2019-03-13 LAB — TROPONIN I
Troponin I: 0.06 ng/mL (ref <0.03)
Troponin I: 0.22 ng/mL (ref <0.03)
Troponin I: 0.31 ng/mL (ref <0.03)

## 2019-03-13 LAB — MRSA PCR SCREENING: MRSA by PCR: NEGATIVE

## 2019-03-13 LAB — RAPID URINE DRUG SCREEN, HOSP PERFORMED
Amphetamines: NOT DETECTED
Barbiturates: NOT DETECTED
Benzodiazepines: NOT DETECTED
Cocaine: NOT DETECTED
Opiates: NOT DETECTED
Tetrahydrocannabinol: NOT DETECTED

## 2019-03-13 LAB — BASIC METABOLIC PANEL
Anion gap: 8 (ref 5–15)
BUN: 12 mg/dL (ref 8–23)
CO2: 20 mmol/L — ABNORMAL LOW (ref 22–32)
Calcium: 8.7 mg/dL — ABNORMAL LOW (ref 8.9–10.3)
Chloride: 108 mmol/L (ref 98–111)
Creatinine, Ser: 0.97 mg/dL (ref 0.61–1.24)
GFR calc Af Amer: >60 mL/min (ref >60)
GFR calc non Af Amer: >60 mL/min (ref >60)
Glucose, Bld: 95 mg/dL (ref 70–99)
Potassium: 4.3 mmol/L (ref 3.5–5.1)
Sodium: 136 mmol/L (ref 135–145)

## 2019-03-13 LAB — PHOSPHORUS
Phosphorus: 4.2 mg/dL (ref 2.5–4.6)
Phosphorus: 4.1 mg/dL (ref 2.5–4.6)

## 2019-03-13 LAB — LACTIC ACID, PLASMA
Lactic Acid, Venous: 3 mmol/L (ref 0.5–1.9)
Lactic Acid, Venous: 3.4 mmol/L (ref 0.5–1.9)

## 2019-03-13 LAB — GLUCOSE, CAPILLARY
Glucose-Capillary: 144 mg/dL — ABNORMAL HIGH (ref 70–99)
Glucose-Capillary: 126 mg/dL — ABNORMAL HIGH (ref 70–99)

## 2019-03-13 LAB — CBG MONITORING, ED: Glucose-Capillary: 172 mg/dL — ABNORMAL HIGH (ref 70–99)

## 2019-03-13 LAB — ETHANOL: Alcohol, Ethyl (B): <10 mg/dL (ref <10)

## 2019-03-13 LAB — ECHOCARDIOGRAM LIMITED: Weight: 3816.6 oz

## 2019-03-13 LAB — PROCALCITONIN: Procalcitonin: 0.6 ng/mL

## 2019-03-13 LAB — TYPE AND SCREEN
ABO/RH(D): O POS
Antibody Screen: NEGATIVE

## 2019-03-13 LAB — APTT: aPTT: 39 seconds — ABNORMAL HIGH (ref 24–36)

## 2019-03-13 LAB — BRAIN NATRIURETIC PEPTIDE: B Natriuretic Peptide: 2003.2 pg/mL — ABNORMAL HIGH (ref 0.0–100.0)

## 2019-03-13 MED ORDER — MIDAZOLAM HCL 2 MG/2ML IJ SOLN
1.0000 mg | INTRAMUSCULAR | Status: DC | PRN
  Administered 2019-03-13: 13:00:00 1 mg via INTRAVENOUS
  Filled 2019-03-13: qty 2

## 2019-03-13 MED ORDER — ATROPINE SULFATE 1 MG/ML IJ SOLN
INTRAMUSCULAR | Status: AC | PRN
  Administered 2019-03-13: 1 mg via INTRAVENOUS

## 2019-03-13 MED ORDER — ENOXAPARIN SODIUM 40 MG/0.4ML ~~LOC~~ SOLN: 40.0000 mg | SUBCUTANEOUS | Status: DC

## 2019-03-13 MED ORDER — MIDAZOLAM HCL 2 MG/2ML IJ SOLN
INTRAMUSCULAR | Status: AC
  Filled 2019-03-13: qty 2

## 2019-03-13 MED ORDER — FENTANYL CITRATE (PF) 100 MCG/2ML IJ SOLN
50.0000 ug | Freq: Once | INTRAMUSCULAR | Status: AC
  Administered 2019-03-13: 50 ug via INTRAVENOUS

## 2019-03-13 MED ORDER — ONDANSETRON HCL 4 MG/2ML IJ SOLN: 4.0000 mg | Freq: Four times a day (QID) | INTRAMUSCULAR | Status: DC | PRN

## 2019-03-13 MED ORDER — MIDAZOLAM HCL 2 MG/2ML IJ SOLN
1.0000 mg | INTRAMUSCULAR | Status: DC | PRN
  Administered 2019-03-13 – 2019-03-14 (×3): 2 mg via INTRAVENOUS
  Filled 2019-03-13 (×3): qty 2

## 2019-03-13 MED ORDER — METOPROLOL TARTRATE 50 MG PO TABS: 50.0000 mg | ORAL_TABLET | Freq: Two times a day (BID) | ORAL | 11 refills | Status: AC

## 2019-03-13 MED ORDER — FENTANYL CITRATE (PF) 100 MCG/2ML IJ SOLN
50.0000 ug | INTRAMUSCULAR | Status: DC | PRN
  Filled 2019-03-13: qty 2

## 2019-03-13 MED ORDER — EPINEPHRINE 1 MG/10ML IJ SOSY
PREFILLED_SYRINGE | INTRAMUSCULAR | Status: AC | PRN
  Administered 2019-03-13: 1 via INTRAVENOUS
  Administered 2019-03-13: 1 mg via INTRAVENOUS

## 2019-03-13 MED ORDER — SODIUM CHLORIDE 0.9 % IV SOLN
2.0000 g | Freq: Three times a day (TID) | INTRAVENOUS | Status: DC
  Administered 2019-03-13 – 2019-03-15 (×6): 2 g via INTRAVENOUS
  Filled 2019-03-13 (×8): qty 2

## 2019-03-13 MED ORDER — CHLORHEXIDINE GLUCONATE 0.12% ORAL RINSE (MEDLINE KIT)
15.0000 mL | Freq: Two times a day (BID) | OROMUCOSAL | Status: DC
  Administered 2019-03-13 – 2019-03-18 (×10): 15 mL via OROMUCOSAL

## 2019-03-13 MED ORDER — ACETAMINOPHEN 325 MG PO TABS: 650.0000 mg | ORAL_TABLET | ORAL | Status: DC | PRN

## 2019-03-13 MED ORDER — FENTANYL CITRATE (PF) 100 MCG/2ML IJ SOLN
INTRAMUSCULAR | Status: AC | PRN
  Administered 2019-03-13: 50 ug via INTRAVENOUS

## 2019-03-13 MED ORDER — MIDAZOLAM HCL 2 MG/2ML IJ SOLN
1.0000 mg | INTRAMUSCULAR | Status: DC | PRN
  Administered 2019-03-13: 15:00:00 1 mg via INTRAVENOUS

## 2019-03-13 MED ORDER — ORAL CARE MOUTH RINSE
15.0000 mL | OROMUCOSAL | Status: DC
  Administered 2019-03-13 – 2019-03-18 (×46): 15 mL via OROMUCOSAL

## 2019-03-13 MED ORDER — HEPARIN (PORCINE) 25000 UT/250ML-% IV SOLN: 1400.0000 [IU]/h | INTRAVENOUS | Status: DC

## 2019-03-13 MED ORDER — ATORVASTATIN CALCIUM 80 MG PO TABS: ORAL_TABLET | ORAL | 1 refills | Status: AC

## 2019-03-13 MED ORDER — PANTOPRAZOLE SODIUM 40 MG PO TBEC: 40.0000 mg | DELAYED_RELEASE_TABLET | Freq: Two times a day (BID) | ORAL | 0 refills | Status: AC

## 2019-03-13 MED ORDER — INSULIN ASPART 100 UNIT/ML ~~LOC~~ SOLN: 1.0000 [IU] | SUBCUTANEOUS | Status: DC | PRN

## 2019-03-13 MED ORDER — LACTATED RINGERS IV SOLN
INTRAVENOUS | Status: DC
  Administered 2019-03-13 – 2019-03-14 (×2): via INTRAVENOUS

## 2019-03-13 MED ORDER — POTASSIUM CHLORIDE CRYS ER 20 MEQ PO TBCR: 40.0000 meq | EXTENDED_RELEASE_TABLET | Freq: Every day | ORAL | 0 refills | Status: AC

## 2019-03-13 MED ORDER — SODIUM CHLORIDE 0.9 % IV SOLN
2000.0000 mg | Freq: Once | INTRAVENOUS | Status: AC
  Administered 2019-03-13: 2000 mg via INTRAVENOUS
  Filled 2019-03-13: qty 20

## 2019-03-13 MED ORDER — FENTANYL CITRATE (PF) 100 MCG/2ML IJ SOLN: 50.0000 ug | INTRAMUSCULAR | Status: DC | PRN

## 2019-03-13 MED ORDER — ALBUTEROL SULFATE (2.5 MG/3ML) 0.083% IN NEBU: 2.5000 mg | INHALATION_SOLUTION | RESPIRATORY_TRACT | Status: DC | PRN

## 2019-03-13 MED ORDER — LORAZEPAM 2 MG/ML IJ SOLN: 2.0000 mg | INTRAMUSCULAR | Status: DC | PRN

## 2019-03-13 MED ORDER — LACTATED RINGERS IV SOLN
Freq: Once | INTRAVENOUS | Status: AC
  Administered 2019-03-13: 19:00:00 via INTRAVENOUS

## 2019-03-13 MED ORDER — SODIUM CHLORIDE 0.9 % IV SOLN
2.0000 g | Freq: Once | INTRAVENOUS | Status: AC
  Administered 2019-03-13: 13:00:00 2 g via INTRAVENOUS
  Filled 2019-03-13: qty 2

## 2019-03-13 MED ORDER — TRAMADOL-ACETAMINOPHEN 37.5-325 MG PO TABS: 1.0000 | ORAL_TABLET | Freq: Four times a day (QID) | ORAL | 0 refills | Status: AC | PRN

## 2019-03-13 MED ORDER — VANCOMYCIN HCL 10 G IV SOLR
2000.0000 mg | Freq: Once | INTRAVENOUS | Status: AC
  Administered 2019-03-13: 2000 mg via INTRAVENOUS
  Filled 2019-03-13: qty 2000

## 2019-03-13 MED ORDER — LORAZEPAM 2 MG/ML IJ SOLN
INTRAMUSCULAR | Status: AC
  Administered 2019-03-13: 20:00:00 2 mg
  Filled 2019-03-13: qty 1

## 2019-03-13 MED ORDER — LEVETIRACETAM IN NACL 1000 MG/100ML IV SOLN
1000.0000 mg | Freq: Two times a day (BID) | INTRAVENOUS | Status: DC
  Administered 2019-03-14 – 2019-03-18 (×9): 1000 mg via INTRAVENOUS
  Filled 2019-03-13 (×9): qty 100

## 2019-03-13 MED ORDER — PANTOPRAZOLE SODIUM 40 MG IV SOLR
40.0000 mg | Freq: Every day | INTRAVENOUS | Status: DC
  Administered 2019-03-13 – 2019-03-17 (×5): 40 mg via INTRAVENOUS
  Filled 2019-03-13 (×5): qty 40

## 2019-03-13 MED ORDER — PROPOFOL 1000 MG/100ML IV EMUL
0.0000 ug/kg/min | INTRAVENOUS | Status: DC
  Filled 2019-03-13: qty 100

## 2019-03-13 MED ORDER — NOREPINEPHRINE BITARTRATE 1 MG/ML IV SOLN
INTRAVENOUS | Status: AC | PRN
  Administered 2019-03-13: 11.553 ug/kg/min via INTRAVENOUS

## 2019-03-13 MED ORDER — VANCOMYCIN HCL 10 G IV SOLR
1750.0000 mg | INTRAVENOUS | Status: DC
  Administered 2019-03-14 – 2019-03-15 (×2): 1750 mg via INTRAVENOUS
  Filled 2019-03-13 (×3): qty 1750

## 2019-03-13 MED ORDER — FENTANYL CITRATE (PF) 100 MCG/2ML IJ SOLN
INTRAMUSCULAR | Status: AC
  Filled 2019-03-13: qty 2

## 2019-03-13 MED ORDER — APIXABAN 5 MG PO TABS: 5.0000 mg | ORAL_TABLET | Freq: Two times a day (BID) | ORAL | 1 refills | Status: AC

## 2019-03-13 MED ORDER — AMLODIPINE BESYLATE 5 MG PO TABS: 5.0000 mg | ORAL_TABLET | Freq: Every day | ORAL | 11 refills | Status: AC

## 2019-03-13 MED ORDER — FUROSEMIDE 40 MG PO TABS: 40.0000 mg | ORAL_TABLET | Freq: Every day | ORAL | 11 refills | Status: AC

## 2019-03-13 NOTE — ED Notes (Signed)
Unable to obtain blood cultures prior to starting antibiotics.

## 2019-03-13 NOTE — H&P (Addendum)
NAME:  Joel Rogers, MRN:  [id number], DOB:  December 05, 1950, LOS: 0 ADMISSION DATE:  Mar 30, 2019, CONSULTATION DATE:  2019/03/30 REFERRING MD: Criss Alvine, EM  , CHIEF COMPLAINT:  Cardiac arrest   Brief History   68 yo M discharged today from rehab, became unresponsive while being driven home for about 10 minutes until arriving to ED. At ED found to be pulseless, unresponsive. ACLS initiated, ROSC after approx 10 minutes. Now intubated. CT Head no acute process.   History of present illness   68 yo M discharged 4/21 from Rehab. Prior presentation was 3/14 s/p VFib arrest, with subsequent hospital stay and rehab course for treatment of Staph bacteremia/cellulitis and GIB. In ED patient received 2x Epi and chest compressions for approximately 10 minutes. He was intubated in ED. CT Head reveals no acute process.  We will admit to ICU.   Past Medical History   Anemia     low iron  . Arthritis    bilateral knees  . Atrial fibrillation, chronic   . Chronic diastolic CHF (congestive heart failure) (HCC) 06/15/2018  . History of subdural hematoma   . Hyperlipidemia   . Hypertension   . Morbid obesity (HCC)   . Normal coronary arteries    by cardiac catheterization performed 03/14/06  . Pre-diabetes    pt denies  . S/P TAVR (transcatheter aortic valve replacement)    a. 07/25/18: Edwards Sapien 3 THV (size 26 mm, model # 9600TFX, serial # [id number]) by Dr. Laneta Simmers and Dr. Clifton James  . Sleep apnea    on CPAP  . Venous insufficiency      Significant Hospital Events   4/21> 10 minutes ACLS with ROSC. Intubated. CT Head no acute process. Admit to ICU   Consults:  PCCM   Procedures:  ETT 4/21>   Significant Diagnostic Tests:  CT Head 4/21> no acute abnormality. Prior SDH drainage on right.   Micro Data:  4/21 BCx> 4/21 Tracheal aspirate >   Antimicrobials:  4/21 vanc/cefepime>   Interim history/subjective:  S/p cardiac arrest in ED with ROSC after downtime of approx 10  min in ED  Intubated in ED   Objective   Blood pressure 102/72, pulse 74, temperature 99.1 F (37.3 C), temperature source Rectal, resp. rate 16, SpO2 100 %.    Vent Mode: PRVC FiO2 (%):  [100 %] 100 % Set Rate:  [16 bmp] 16 bmp Vt Set:  [630 mL] 630 mL PEEP:  [5 cmH20] 5 cmH20   Intake/Output Summary (Last 24 hours) at 03-30-19 1333 Last data filed at 30-Mar-2019 1237 Gross per 24 hour  Intake 1700 ml  Output -  Net 1700 ml   There were no vitals filed for this visit.  Examination: General: Chronically ill appearing M, intubated, NAD  HENT: NCAT. R EJ present. ETT secure. Trachea midline. Pink mmm  Lungs: CTA bilaterally. Symmetrical chest expansion. No accessory muscle recruitment  Cardiovascular: Irregular rhythm. S1s2, noi rub or gallop. Murmur appreciated  Abdomen: Soft round ndnt, normoactive x4  Extremities: 3+ pitting edema RLE, non-pitting edema LLE. No clubbing, no cyanosis  Neuro: Sedated, awakens to noxious stimuli. Does not follow commands. PERRL 3mm  Skin: clean, dry, warm, intact.   Resolved Hospital Problem list     Assessment & Plan:   S/p Cardiac Arrest -10 minutews CPR, 2x epi with rosc -PMH VFib arrest P -No TTM -MAPs > 65, not requiring pressors at this time -Cardiology consult -ECHO ordered -cycle troponins   Acute respiratory failure requiring intubation -  in setting of cardiac arrest P Continue mechanical vent support AM WUA/SBT pulm hygiene Follow up ABG Adjust PEEP/FiO2 for goal SpO2 >92 PRN albuterol  Follow up PCT Follow up Tracheal aspirate  Afib -continue tele -hold eliquis   HTN -hold antihypertensives at this time  S/P TAVR CAD Lateral Ramus lesion stenosis -99% stenosis P consult cardiology  ECHO ordered AM ECG   Pre-diabetes P SSI  History of recent embolic CVA, hypoxic ischemic encephalopathy -CT H 4/21 negative for acute process P Follow neuro exam  PRN sedation, RASS goal 0, -1   History of Staph  bacteremia/cellulitis, s/p abx treatment P Follow up BCx, tracheal aspirate Vanc/cef initiated in ED, narrow as able    Of note: patiently was discharged from rehab today. Discharge medications are as follows 1.Norvasc 5 mg by mouth daily 2. Eliquis 5 mg by mouth twice a day 3. Lipitor 80 mg by mouth daily 4. Lasix 40 mg by mouth daily 5. Lopressor 50 mg by mouth twice a day 6. Nitroglycerin as needed chest pain 7. Protonix 40 mg by mouth twice a day 8. Potassium chloride 40 mEq by mouth daily 9. Ultracet 1-2 tablets every 6 hours as needed pain   Best practice:  Diet: NPO  Pain/Anxiety/Delirium protocol (if indicated): PRN fentanyl/versed  VAP protocol (if indicated): yes  DVT prophylaxis: Lovenox  GI prophylaxis: Protonix  Glucose control: SSI  Mobility: bedrest  Code Status: full  Family Communication: wife and son updated in conference room  Disposition: admit to ICU   Labs   CBC: Recent Labs  Lab 03/09/19 1138 02/28/2019 1201 03/09/2019 1325  WBC 10.9* 13.9*  --   NEUTROABS  --  8.9*  --   HGB 8.9* 9.0* 8.5*  HCT 28.3* 30.8* 25.0*  MCV 77.3* 85.1  --   PLT 234 172  --     Basic Metabolic Panel: Recent Labs  Lab 03/09/19 1138 03/03/2019 0555 03/18/2019 1201 03/04/2019 1325  NA 136 136 136 137  K 4.8 4.3 5.0 4.2  CL 110 108 108  --   CO2 17* 20* 10*  --   GLUCOSE 105* 95 198*  --   BUN --   CREATININE 0.83 0.97 1.12  --   CALCIUM 8.7* 8.7* 8.9  --    GFR: Estimated Creatinine Clearance: 81.4 mL/min (by C-G formula based on SCr of 1.12 mg/dL). Recent Labs  Lab 03/09/19 1138 02/21/2019 1201  WBC 10.9* 13.9*    Liver Function Tests: Recent Labs  Lab 03/19/2019 1201  AST 38  ALT 21  ALKPHOS 120  BILITOT 0.7  PROT 6.6  ALBUMIN 2.2*   No results for input(s): LIPASE, AMYLASE in the last 168 hours. No results for input(s): AMMONIA in the last 168 hours.  ABG    Component Value Date/Time   PHART 7.287 (L) 02/28/2019 1325   PCO2ART 35.2  02/23/2019 1325   PO2ART 201.0 (H) 02/25/2019 1325   HCO3 16.8 (L) 02/26/2019 1325   TCO2 18 (L) 03/19/2019 1325   ACIDBASEDEF 9.0 (H) 03/04/2019 1325   O2SAT 100.0 03/09/2019 1325     Coagulation Profile: Recent Labs  Lab 03/08/2019 1201  INR 1.9*    Cardiac Enzymes: Recent Labs  Lab 03/01/2019 1201  TROPONINI 0.06*    HbA1C: Hemoglobin A1C  Date/Time Value Ref Range Status  01/22/2016 6.2  Final   Hgb A1c MFr Bld  Date/Time Value Ref Range Status  07/21/2018 10:20 AM 5.8 (H) 4.8 - 5.6 %  Final    Comment:    (NOTE) Pre diabetes:          5.7%-6.4% Diabetes:              >6.4% Glycemic control for   <7.0% adults with diabetes   11/26/2016 02:06 PM 5.6 <5.7 % Final    Comment:      For the purpose of screening for the presence of diabetes:   <5.7%       Consistent with the absence of diabetes 5.7-6.4 %   Consistent with increased risk for diabetes (prediabetes) >=6.5 %     Consistent with diabetes   This assay result is consistent with a decreased risk of diabetes.   Currently, no consensus exists regarding use of hemoglobin A1c for diagnosis of diabetes in children.   According to American Diabetes Association (ADA) guidelines, hemoglobin A1c <7.0% represents optimal control in non-pregnant diabetic patients. Different metrics may apply to specific patient populations. Standards of Medical Care in Diabetes (ADA).       CBG: Recent Labs  Lab 2019/03/24 1144  GLUCAP 172*    Review of Systems:   Per wife Denied chest pain, palpitations, chest tightness, arm pain Denied SOB, cough, runny nose Denied fever chills weakness fatigue Denied  N/v/d  Denied extremity pain Denied dizziness, HA, confusion   Past Medical History  He,  has a past medical history of Anemia, Arthritis, Atrial fibrillation, chronic, Chronic diastolic CHF (congestive heart failure) (HCC) (06/15/2018), History of subdural hematoma, Hyperlipidemia, Hypertension, Morbid obesity (HCC),  Myocardial infarction (HCC), Normal coronary arteries, Pre-diabetes, S/P TAVR (transcatheter aortic valve replacement), Sleep apnea, Stroke (HCC), Venous insufficiency, and VF (ventricular fibrillation) (HCC) causing cardiac arrest (02/03/2019).   Surgical History    Past Surgical History:  Procedure Laterality Date  . BIOPSY  01/26/2019   Procedure: BIOPSY;  Surgeon: Bernette Redbird, MD;  Location: [facility] ENDOSCOPY;  Service: Endoscopy;;  . CRANIOTOMY Right 08/27/2016   Procedure: CRANIOTOMY HEMATOMA EVACUATION SUBDURAL;  Surgeon: Maeola Harman, MD;  Location: [facility] OR;  Service: Neurosurgery;  Laterality: Right;  . ESOPHAGOGASTRODUODENOSCOPY N/A 01/21/2019   Procedure: ESOPHAGOGASTRODUODENOSCOPY (EGD);  Surgeon: Vida Rigger, MD;  Location: [facility] ENDOSCOPY;  Service: Endoscopy;  Laterality: N/A;  . ESOPHAGOGASTRODUODENOSCOPY (EGD) WITH PROPOFOL N/A 01/22/2019   Procedure: ESOPHAGOGASTRODUODENOSCOPY (EGD) WITH PROPOFOL;  Surgeon: Bernette Redbird, MD;  Location: [facility] ENDOSCOPY;  Service: Endoscopy;  Laterality: N/A;  . ESOPHAGOGASTRODUODENOSCOPY (EGD) WITH PROPOFOL N/A 01/26/2019   Procedure: ESOPHAGOGASTRODUODENOSCOPY (EGD) WITH PROPOFOL;  Surgeon: Bernette Redbird, MD;  Location: [facility] ENDOSCOPY;  Service: Endoscopy;  Laterality: N/A;  . GASTRIC BYPASS  09/2008  . JOINT REPLACEMENT  08/31/2006   right hip  . LEFT HEART CATH AND CORONARY ANGIOGRAPHY N/A 02/05/2019   Procedure: LEFT HEART CATH AND CORONARY ANGIOGRAPHY;  Surgeon: Corky Crafts, MD;  Location: [facility] INVASIVE CV LAB;  Service: Cardiovascular;  Laterality: N/A;  . MULTIPLE EXTRACTIONS WITH ALVEOLOPLASTY N/A 07/13/2018   Procedure: Extraction of tooth #'s 3,8,10, 23-26, 30and 32 with alveoloplasty and gross debridement of remaining teeth;  Surgeon: Charlynne Pander, DDS;  Location: [facility] OR;  Service: Oral Surgery;  Laterality: N/A;  . RIGHT HEART CATH N/A 07/06/2018   Procedure: RIGHT HEART CATH;  Surgeon: Kathleene Hazel, MD;  Location: [facility]  INVASIVE CV LAB;  Service: Cardiovascular;  Laterality: N/A;  . RIGHT/LEFT HEART CATH AND CORONARY ANGIOGRAPHY N/A 07/10/2018   Procedure: RIGHT/LEFT HEART CATH AND CORONARY ANGIOGRAPHY;  Surgeon: Dolores Patty, MD;  Location: [facility] INVASIVE CV LAB;  Service: Cardiovascular;  Laterality: N/A;  . TEE WITHOUT CARDIOVERSION N/A 07/25/2018   Procedure: TRANSESOPHAGEAL ECHOCARDIOGRAM (TEE);  Surgeon: Kathleene HazelMcAlhany, Christopher D, MD;  [facility];  Service: Open Heart Surgery;  Laterality: N/A;  . TEE WITHOUT CARDIOVERSION N/A 01/18/2019   Procedure: TRANSESOPHAGEAL ECHOCARDIOGRAM (TEE) WITH PROPOFOL;  Surgeon: Jonelle SidleMcDowell, Samuel G, MD;  Location: [facility] ORS;  Service: Cardiovascular;  Laterality: N/A;  . TOTAL HIP ARTHROPLASTY     right hip  . TRANSCATHETER AORTIC VALVE REPLACEMENT, TRANSFEMORAL  07/25/2018  . TRANSCATHETER AORTIC VALVE REPLACEMENT, TRANSFEMORAL N/A 07/25/2018   Procedure: TRANSCATHETER AORTIC VALVE REPLACEMENT, TRANSFEMORAL;  Surgeon: Kathleene HazelMcAlhany, Christopher D, MD;  Location: [facility] OR;  Service: Open Heart Surgery;  Laterality: N/A;     Social History   reports that he has quit smoking. He has never used smokeless tobacco. He reports that he does not drink alcohol or use drugs.   Family History   His family history includes Alzheimer's disease in his maternal grandfather and mother; Breast cancer in his sister; Cancer in his brother and father; Hypertension in his father.   Allergies Allergies  Allergen Reactions  . Kayexalate [Polystyrene] Other (See Comments)    Recommended to avoid by GI due to ischemic gastropathy     Home Medications  Prior to Admission medications   Medication Sig Start Date End Date Taking? Authorizing Provider  acetaminophen (TYLENOL) 650 MG CR tablet Take 650 mg by mouth 2 (two) times daily as needed for pain.    [provider]  amLODipine (NORVASC) 5 MG tablet Take 1 tablet (5 mg total) by mouth daily. 02/22/2019   Angiulli, Mcarthur Rossettianiel J, PA-C  apixaban  (ELIQUIS) 5 MG TABS tablet Take 1 tablet (5 mg total) by mouth 2 (two) times daily. 03/12/2019   Angiulli, Mcarthur Rossettianiel J, PA-C  atorvastatin (LIPITOR) 80 MG tablet TAKE 1 TABLET BY MOUTH ONCE DAILY WITH&nbsp;&nbsp;BREAKFAST 02/28/2019   Angiulli, Mcarthur Rossettianiel J, PA-C  colchicine 0.6 MG tablet Take 1 tablet (0.6 mg total) by mouth as needed (Gout flare up.). 02/12/19   Barrett, Joline Salthonda G, PA-C  furosemide (LASIX) 40 MG tablet Take 1 tablet (40 mg total) by mouth daily. 03/19/2019   Angiulli, Mcarthur Rossettianiel J, PA-C  metoprolol tartrate (LOPRESSOR) 50 MG tablet Take 1 tablet (50 mg total) by mouth 2 (two) times daily. 02/22/2019   Angiulli, Mcarthur Rossettianiel J, PA-C  nitroGLYCERIN (NITROSTAT) 0.4 MG SL tablet Place 1 tablet (0.4 mg total) under the tongue every 5 (five) minutes x 3 doses as needed for chest pain. 02/12/19   Barrett, Joline Salthonda G, PA-C  pantoprazole (PROTONIX) 40 MG tablet Take 1 tablet (40 mg total) by mouth 2 (two) times daily. 03/05/2019   Angiulli, Mcarthur Rossettianiel J, PA-C  potassium chloride SA (K-DUR) 20 MEQ tablet Take 2 tablets (40 mEq total) by mouth daily. 02/26/2019   Angiulli, Mcarthur Rossettianiel J, PA-C  traMADol-acetaminophen (ULTRACET) 37.5-325 MG tablet Take 1-2 tablets by mouth every 6 (six) hours as needed for moderate pain or severe pain. 03/15/2019   Angiulli, Mcarthur Rossettianiel J, PA-C     Critical care time: 7855 minutes     Tessie FassGrace Bowser MSN, AGACNP-BC [facility] [phone] If no answer, [phone] 03/01/2019, 1:33 PM   _________________________________________________________________________  PCCM Attending:  68 yo M, d/c today from rehab, on his ride home become unresponsive, family brought him back to the ED, was found in cardiac arrest. Patient was pulled from car and CPR started. Approximately downtime was 10 mins in the car and 10 mins in the ED,  epi X2 in ed.   BP (!) 117/94   Pulse 63   Temp 98.6 F (37 C) (Oral)   Resp (!) 23   SpO2 100%   Gen: elderly male, intubate, unresponsive on vent Heart: sinus,  regular, s1 s2 Lungs: BL vented breaths, no wheeze  Ext: BL LE Edema   Labs reviewed BNP 2000  History reviewed   A: Cardiac Arrest  - PEA? Unclear etiology  - BL LE edema, pulmonary vascular enlargement, does have hypoxic failure on ABG  - possible PE, does have rise in Scr and post arrest, seems relatively low clinical suspicion as he was on eliquis H/o CAD, ramus disease on last LHC, h/o NSTEMI  H/o GI bleeding  H/o CVA, bicerebral cardioembolic infarction  Acute on chronic diastolic heart failure   P: Start heparin ggt Hold eliquis  Watch for H&H drop  ICU admit  On vent, no changes at this time  Start NEpi if needed to maintain MAP >65 Right now hold on his own  Consulted cardiology services for recs regarding recurrent arrest and CAD from last LHC  Repeat echo per cardiology  Repeat labs in the AM   This patient is critically ill with multiple organ system failure; which, requires frequent high complexity decision making, assessment, support, evaluation, and titration of therapies. This was completed through the application of advanced monitoring technologies and extensive interpretation of multiple databases. During this encounter critical care time was devoted to patient care services described in this note for 36 minutes.   Josephine Igo, DO [facility] 04-12-19 4:12 PM  Personal pager: [phone] If unanswered, please page CCM On-call: #[phone]

## 2019-03-13 NOTE — Consult Note (Signed)
Cardiology Consultation:   Patient ID: Joel Rogers MRN: [id number]; DOB: July 25, 1951  Admit date: 03/01/2019 Date of Consult: 03/15/2019  Primary Care Provider: [name], MD Primary Cardiologist: Nanetta Batty, MD  Primary Electrophysiologist:  None    Patient Profile:   Joel Rogers is a 68 y.o. male with a hx of severe AS with TAVR 9/19, Vfib arrest, NSTEMI, Chronic diastolic HF, chronic Afib, GIB, CVA who is being seen today for the evaluation of cardiac arrest at the request of Dr. Tonia Brooms.  History of Present Illness:   Joel Rogers is a 68 year old male with past medical history noted above.  He underwent TAVR 9/19.  Back in February 2020 he developed a cardio embolic CVA, fall and sepsis leading to admission to CIR secondary to deconditioning. Of note he did have complication of upper GI bleed requiring transfusion during that admission.  Anticoagulation was held but then restarted.  He was seen again by cardiology on 02/03/2019 in regards to burning chest pain.  This had been gone ongoing for several minutes and then became unresponsive with noted V. fib cardiac arrest.  He was defibrillated x3.  Initially unable to intubate but with return of pulses developed return of spontaneous respirations and did not require intubation.  He was placed on lidocaine drip for period of time and eventually added on beta-blocker therapy.  He was able to be weaned from lidocaine and ventricular ectopy improved and resolved.   He was taken to the Cath Lab on 02/05/2019 with only significant lesion being within the ramus intermediate noting significant thrombus burden.  He was continued on heparin until 3/20 and then started on Eliquis for A. fib.  Because of his recent GI bleed and the need for anticoagulation he was not continued on aspirin 81 mg.  Peak troponin was noted to be greater than 65.  He did complain of residual chest pain, but this was felt to be related to the CPR he received.  Also  required diuresis with IV Lasix during that admission and was ultimately placed on 40 mg daily at the time of discharge to inpatient rehab.  His weight was 258 pounds at the time of transfer to rehab.  In regards to his atrial fibrillation he was maintained on metoprolol 50 mg twice daily with focus on rate control. GI was consulted again during this admission and he was placed on a Protonix drip until 3/20, then transition to Protonix 40 mg oral twice daily at the time of discharge.  Hemoglobin did drop to 7.2 and was transfused with an improvement to 8.5.  He also had confusion and mental status changes on admission and neurology was consulted.  He was evaluated by Dr. Pearlean Brownie who did not feel like this was a new stroke, but that changes were most likely from hypoxic ischemic encephalopathy related to his cardiac arrest.  He was followed by PT and OT during admission and discharged to [facility] on 02/12/2019.  In review of rehab notes it appears that he did require an additional transfusion of PRBCs on 02/21/2019.  Noted to have progressed well and was discharged on 03/16/2019.  History regarding presented admission obtained from ED notes. It appears that patient was in the front passenger seat of the car on the way home whenever he became unresponsive.  Family member reported he was like this for about 10 minutes while driven to the ED. On arrival staff found him to be pulseless, apneic, unresponsive and ACLS was initiated.  ROSC obtained after approximately 10 minutes, and 2 rounds of epi.  He was intubated in the ED. CT of his head showed no acute changes.   Labs on admission showed stable electrolytes, Cr 1.12, BNP 2000, Hgb 9.0. CXT without acute edema, though concern for PNA. EKG showed Afib, with RBBB and diffuse ST depression. PCCM to admit for further management.   Past Medical History:  Diagnosis Date   Anemia    low iron   Arthritis    bilateral knees   Atrial fibrillation, chronic    Chronic  diastolic CHF (congestive heart failure) (HCC) 06/15/2018   History of subdural hematoma    Hyperlipidemia    Hypertension    Morbid obesity (HCC)    Myocardial infarction (HCC)    Normal coronary arteries    by cardiac catheterization performed 03/14/06   Pre-diabetes    pt denies   S/P TAVR (transcatheter aortic valve replacement)    a. 07/25/18: Edwards Sapien 3 THV (size 26 mm, model # 9600TFX, serial # [id number]) by Dr. Laneta Simmers and Dr. Clifton James   Sleep apnea    on CPAP   Stroke [facility])    Venous insufficiency    VF (ventricular fibrillation) (HCC) causing cardiac arrest 02/03/2019    Past Surgical History:  Procedure Laterality Date   BIOPSY  01/26/2019   Procedure: BIOPSY;  Surgeon: Bernette Redbird, MD;  Location: [facility] ENDOSCOPY;  Service: Endoscopy;;   CRANIOTOMY Right 08/27/2016   Procedure: CRANIOTOMY HEMATOMA EVACUATION SUBDURAL;  Surgeon: Maeola Harman, MD;  Location: [facility] OR;  Service: Neurosurgery;  Laterality: Right;   ESOPHAGOGASTRODUODENOSCOPY N/A 01/21/2019   Procedure: ESOPHAGOGASTRODUODENOSCOPY (EGD);  Surgeon: Vida Rigger, MD;  Location: [facility] ENDOSCOPY;  Service: Endoscopy;  Laterality: N/A;   ESOPHAGOGASTRODUODENOSCOPY (EGD) WITH PROPOFOL N/A 01/22/2019   Procedure: ESOPHAGOGASTRODUODENOSCOPY (EGD) WITH PROPOFOL;  Surgeon: Bernette Redbird, MD;  Location: [facility] ENDOSCOPY;  Service: Endoscopy;  Laterality: N/A;   ESOPHAGOGASTRODUODENOSCOPY (EGD) WITH PROPOFOL N/A 01/26/2019   Procedure: ESOPHAGOGASTRODUODENOSCOPY (EGD) WITH PROPOFOL;  Surgeon: Bernette Redbird, MD;  Location: [facility] ENDOSCOPY;  Service: Endoscopy;  Laterality: N/A;   GASTRIC BYPASS  09/2008   JOINT REPLACEMENT  08/31/2006   right hip   LEFT HEART CATH AND CORONARY ANGIOGRAPHY N/A 02/05/2019   Procedure: LEFT HEART CATH AND CORONARY ANGIOGRAPHY;  Surgeon: Corky Crafts, MD;  Location: [facility] INVASIVE CV LAB;  Service: Cardiovascular;  Laterality: N/A;   MULTIPLE EXTRACTIONS WITH ALVEOLOPLASTY N/A  07/13/2018   Procedure: Extraction of tooth #'s 3,8,10, 23-26, 30and 32 with alveoloplasty and gross debridement of remaining teeth;  Surgeon: Charlynne Pander, DDS;  Location: [facility] OR;  Service: Oral Surgery;  Laterality: N/A;   RIGHT HEART CATH N/A 07/06/2018   Procedure: RIGHT HEART CATH;  Surgeon: Kathleene Hazel, MD;  Location: [facility] INVASIVE CV LAB;  Service: Cardiovascular;  Laterality: N/A;   RIGHT/LEFT HEART CATH AND CORONARY ANGIOGRAPHY N/A 07/10/2018   Procedure: RIGHT/LEFT HEART CATH AND CORONARY ANGIOGRAPHY;  Surgeon: Dolores Patty, MD;  Location: [facility] INVASIVE CV LAB;  Service: Cardiovascular;  Laterality: N/A;   TEE WITHOUT CARDIOVERSION N/A 07/25/2018   Procedure: TRANSESOPHAGEAL ECHOCARDIOGRAM (TEE);  Surgeon: Kathleene Hazel, MD;  Location: [facility] OR;  Service: Open Heart Surgery;  Laterality: N/A;   TEE WITHOUT CARDIOVERSION N/A 01/18/2019   Procedure: TRANSESOPHAGEAL ECHOCARDIOGRAM (TEE) WITH PROPOFOL;  Surgeon: Jonelle Sidle, MD;  Location: [facility] ORS;  Service: Cardiovascular;  Laterality: N/A;   TOTAL HIP ARTHROPLASTY     right hip   TRANSCATHETER AORTIC VALVE  REPLACEMENT, TRANSFEMORAL  07/25/2018   TRANSCATHETER AORTIC VALVE REPLACEMENT, TRANSFEMORAL N/A 07/25/2018   Procedure: TRANSCATHETER AORTIC VALVE REPLACEMENT, TRANSFEMORAL;  Surgeon: Kathleene Hazel, MD;  Location: [facility] OR;  Service: Open Heart Surgery;  Laterality: N/A;     Home Medications:  Prior to Admission medications   Medication Sig Start Date End Date Taking? Authorizing Provider  acetaminophen (TYLENOL) 650 MG CR tablet Take 650 mg by mouth 2 (two) times daily as needed for pain.    [provider]  amLODipine (NORVASC) 5 MG tablet Take 1 tablet (5 mg total) by mouth daily. 2019-03-22   Angiulli, Mcarthur Rossetti, PA-C  apixaban (ELIQUIS) 5 MG TABS tablet Take 1 tablet (5 mg total) by mouth 2 (two) times daily. 22-Mar-2019   Angiulli, Mcarthur Rossetti, PA-C  atorvastatin (LIPITOR) 80 MG tablet TAKE  1 TABLET BY MOUTH ONCE DAILY WITH&nbsp;&nbsp;BREAKFAST Patient taking differently: Take 80 mg by mouth daily with breakfast.  03/22/19   Angiulli, Mcarthur Rossetti, PA-C  colchicine 0.6 MG tablet Take 1 tablet (0.6 mg total) by mouth as needed (Gout flare up.). 02/12/19   Barrett, Joline Salt, PA-C  furosemide (LASIX) 40 MG tablet Take 1 tablet (40 mg total) by mouth daily. 2019-03-22   Angiulli, Mcarthur Rossetti, PA-C  metoprolol tartrate (LOPRESSOR) 50 MG tablet Take 1 tablet (50 mg total) by mouth 2 (two) times daily. 22-Mar-2019   Angiulli, Mcarthur Rossetti, PA-C  nitroGLYCERIN (NITROSTAT) 0.4 MG SL tablet Place 1 tablet (0.4 mg total) under the tongue every 5 (five) minutes x 3 doses as needed for chest pain. 02/12/19   Barrett, Joline Salt, PA-C  pantoprazole (PROTONIX) 40 MG tablet Take 1 tablet (40 mg total) by mouth 2 (two) times daily. March 22, 2019   Angiulli, Mcarthur Rossetti, PA-C  potassium chloride SA (K-DUR) 20 MEQ tablet Take 2 tablets (40 mEq total) by mouth daily. Mar 22, 2019   Angiulli, Mcarthur Rossetti, PA-C  traMADol-acetaminophen (ULTRACET) 37.5-325 MG tablet Take 1-2 tablets by mouth every 6 (six) hours as needed for moderate pain or severe pain. 03-22-19   Angiulli, Mcarthur Rossetti, PA-C    Inpatient Medications: Scheduled Meds:  enoxaparin (LOVENOX) injection  40 mg Subcutaneous Q24H   fentaNYL       midazolam       pantoprazole (PROTONIX) IV  40 mg Intravenous QHS   Continuous Infusions:  ceFEPime (MAXIPIME) IV     [START ON 03/14/2019] vancomycin     vancomycin 2,000 mg (2019-03-22 1300)   PRN Meds: acetaminophen, albuterol, fentaNYL (SUBLIMAZE) injection, fentaNYL (SUBLIMAZE) injection, insulin aspart, midazolam, midazolam, ondansetron (ZOFRAN) IV  Allergies:    Allergies  Allergen Reactions   Kayexalate [Polystyrene] Other (See Comments)    Recommended to avoid by GI due to ischemic gastropathy    Social History:   Social History   Socioeconomic History   Marital status: Married    Spouse name: [name]   Number  of children: 2   Years of education: 16   Highest education level: Not on file  Occupational History   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    Comment: disability retirement  Ecologist strain: Not on file   Food insecurity:    Worry: Not on file    Inability: Not on file   Transportation needs:    Medical: Not on file    Non-medical: Not on file  Tobacco Use   Smoking status: Former Smoker   Smokeless tobacco: Never Used  Substance and Sexual Activity   Alcohol  use: No   Drug use: No   Sexual activity: Not Currently  Lifestyle   Physical activity:    Days per week: Not on file    Minutes per session: Not on file   Stress: Not on file  Relationships   Social connections:    Talks on phone: Not on file    Gets together: Not on file    Attends religious service: Not on file    Active member of club or organization: Not on file    Attends meetings of clubs or organizations: Not on file    Relationship status: Not on file   Intimate partner violence:    Fear of current or ex partner: Not on file    Emotionally abused: Not on file    Physically abused: Not on file    Forced sexual activity: Not on file  Other Topics Concern   Not on file  Social History Narrative   Doctorate in ministry   Retired Data processing managermaintenance mechanic   Disability from hip replacement   Lives with wife Teacher, adult educationJacqueline   Exercises at the [employer] - water exercise    Family History:    Family History  Problem Relation Age of Onset   Hypertension Father    Cancer Father        prob prostate   Alzheimer's disease Mother    Alzheimer's disease Maternal Grandfather    Breast cancer Sister    Cancer Brother        "brain tumor"     ROS:  Please see the history of present illness.   All other ROS reviewed and negative. Obtained from chart review as patient is intubated.     Physical Exam/Data:   Vitals:   04/03/19 1315 04/03/19 1330 04/03/19 1400 04/03/19  1415  BP: 102/72 (!) 98/57 105/61 112/61  Pulse:      Resp: 16 16 17 17   Temp:      TempSrc:      SpO2:        Intake/Output Summary (Last 24 hours) at 01-31-2019 1430 Last data filed at 01-31-2019 1345 Gross per 24 hour  Intake 1800 ml  Output --  Net 1800 ml   Last 3 Weights 01-31-2019 03/12/2019 03/11/2019  Weight (lbs) 238 lb 8.6 oz 229 lb 8 oz 219 lb 2.2 oz  Weight (kg) 108.2 kg 104.1 kg 99.4 kg     There is no height or weight on file to calculate BMI.   PHYSICAL EXAM per MD.  EKG:  The EKG was personally reviewed and demonstrates:  Afib, RBBB with diffuse ST depression.   Relevant CV Studies:  Cath: 02/05/19    Lat Ramus lesion is 99% stenosed. Heavy thrombus.  No other significant CAD.   D/w Dr. Excell Seltzerooper.  Restart IV heparin tonight and if Hbg stable, will restart Eliquis tomorrow.   Medical therapy for ramus disease.     TTE: 02/05/19  IMPRESSIONS    1. The left ventricle has hyperdynamic systolic function, with an ejection fraction of >65%. The cavity size was normal. There is moderate concentric left ventricular hypertrophy. Left ventricular diastolic function could not be evaluated secondary to  atrial fibrillation. No evidence of left ventricular regional wall motion abnormalities.  2. The right ventricle has mildly reduced systolic function. The cavity was normal. There is no increase in right ventricular wall thickness. Right ventricular systolic pressure is severely elevated with an estimated pressure of 63.3 mmHg.  3. Left atrial size was severely dilated.  4. Right atrial size was severely dilated.  5. Small pericardial effusion.  6. The pericardial effusion is posterior to the left ventricle and the left atrium.  7. There is mild mitral annular calcification present.  8. Tricuspid valve regurgitation is mild-moderate.  9. A 26 an Edwards Edwards Sapien bioprosthetic aortic valve (TAVR) valve is present in the aortic position. Normal aortic valve  prosthesis. 10. The pulmonic valve was grossly normal. Pulmonic valve regurgitation is mild to moderate is mild by color flow Doppler. 11. The inferior vena cava was dilated in size with <50% respiratory variability. 12. Possible small PFO. 13. There is right bowing of the interatrial septum, suggestive of elevated left atrial pressure.  Laboratory Data:  Chemistry Recent Labs  Lab 03/09/19 1138 03/03/2019 0555 03/02/2019 1201 03/10/2019 1325  NA 136 136 136 137  K 4.8 4.3 5.0 4.2  CL 110 108 108  --   CO2 17* 20* 10*  --   GLUCOSE 105* 95 198*  --   BUN 13 12 12   --   CREATININE 0.83 0.97 1.12  --   CALCIUM 8.7* 8.7* 8.9  --   GFRNONAA >60 >60 >60  --   GFRAA >60 >60 >60  --   ANIONGAP 9 8 18*  --     Recent Labs  Lab 03/20/2019 1201  PROT 6.6  ALBUMIN 2.2*  AST 38  ALT 21  ALKPHOS 120  BILITOT 0.7   Hematology Recent Labs  Lab 03/09/19 1138 02/23/2019 1201 03/15/2019 1325  WBC 10.9* 13.9*  --   RBC 3.66* 3.62*  --   HGB 8.9* 9.0* 8.5*  HCT 28.3* 30.8* 25.0*  MCV 77.3* 85.1  --   MCH 24.3* 24.9*  --   MCHC 31.4 29.2*  --   RDW 18.9* 20.5*  --   PLT 234 172  --    Cardiac Enzymes Recent Labs  Lab 02/22/2019 1201  TROPONINI 0.06*   No results for input(s): TROPIPOC in the last 168 hours.  BNP Recent Labs  Lab 03/10/2019 1201  BNP 2,003.2*    DDimer No results for input(s): DDIMER in the last 168 hours.  Radiology/Studies:  Ct Head Wo Contrast  Result Date: 03/17/2019 CLINICAL DATA:  Altered level of consciousness EXAM: CT HEAD WITHOUT CONTRAST TECHNIQUE: Contiguous axial images were obtained from the base of the skull through the vertex without intravenous contrast. COMPARISON:  February 06, 2019 and October 26, 2016 head CT examinations; brain MRI January 19, 2019 FINDINGS: Note that there is a degree of motion artifact. Brain: There is stable age related volume loss. There is no intracranial mass, hemorrhage, extra-axial fluid collection, or midline shift. There  is mild small vessel disease in the centra semiovale bilaterally. There is decreased attenuation in the periphery the inferior right cerebellum on the right consistent with prior infarct. No acute appearing infarct is evident currently. Vascular: There is no hyperdense vessel. There is calcification in each carotid siphon region. Skull: Evidence of previous craniotomy defect on the right. No acute appearing bony defects are evident. Sinuses/Orbits: Paranasal sinuses appear grossly clear. Note that there is underlying motion artifact. Orbits appear symmetric bilaterally with limitation due to motion artifact. Other: Mastoid air cells appear clear. IMPRESSION: Less than optimal study due to motion artifact. Prior infarcts and areas of small vessel disease present. No acute appearing infarct evident. No mass or hemorrhage currently evident. Apparent resolution of previous subarachnoid hemorrhage. Previous subdural hematoma drainage on the right with evidence of previous right-sided  craniotomy. There are foci of arterial vascular calcification. Electronically Signed   By: Bretta Bang III M.D.   On: 03/15/2019 12:49   Dg Chest Portable 1 View  Result Date: 03/08/2019 CLINICAL DATA:  Cardiorespiratory arrest. Intubation. EXAM: PORTABLE CHEST 1 VIEW COMPARISON:  02/03/2019 and earlier. FINDINGS: Endotracheal tube tip in satisfactory position projecting approximately 5 cm above the carina. External pacing pads. Prior TAVR. Cardiac silhouette markedly enlarged, unchanged. Pulmonary venous hypertension without overt edema. Minimal patchy opacities at the RIGHT lung base and in the LEFT UPPER LOBE, new since the most recent prior examination. No visible pleural effusions. IMPRESSION: 1. Endotracheal tube tip in satisfactory position projecting approximately 5 cm above the carina. 2. Stable marked cardiomegaly. Pulmonary venous hypertension without overt edema. 3. Minimal patchy atelectasis versus bronchopneumonia at  the RIGHT lung base and in the LEFT upper lobe. Electronically Signed   By: Hulan Saas M.D.   On: 03/20/2019 12:22    Assessment and Plan:   Joel Rogers is a 67 y.o. male with a hx of severe AS with TAVR 9/19, Vfib arrest, NSTEMI, Chronic diastolic HF, chronic Afib, GIB, CVA who is being seen today for the evaluation of cardiac arrest at the request of Dr. Tonia Brooms.  1. Cardiac Arrest: discharged from cardiac rehab today and became unresponsive in the car on the way home. ROSC after about 10 minutes with Epi x2. Now intubated and admitted to [facility] for further management. Follow regarding neurological recovery.   2. Acute respiratory failure: s/p intubation while in the ED. PCCM managing  3. CAD: underwent cath back on 3/16 with thrombus noted to Ramus branch. Placed on heparin and then transitioned to Eliquis. Has not been on ASA given the need for [facility].   4. Severe AS s/p TAVR: by Dr. Clifton James back in 9/19. Stable on most recent echo.  5. Permanent Afib: focus on rate controlled. Was on metoprolol  BID according to DC paperwork from CIR. Meds held in the setting arrest, would restart when appropriate.  6. Hx of cardioembolic CVA: no acute findings on CT head on admission.  7. Hx of UGIB: Hgb stable at 9.  8. Chronic diastolic HF: BNP 1610, without significant overload on CXR. Was receiving lasix  daily while in CIR. Will likely need diuresis as tolerated this admission.   9. Hx of staph bacteremia: antibiotics per primary   For questions or updates, please contact [facility] Please consult [email or web address] for contact info under     Signed, Laverda Page, NP  03/05/2019 2:30 PM   Patient seen and examined and history reviewed. Agree with above findings and plan. Very unfortunate 67 yo BM with extensive CV history since September. S/p TAVR. Subsequent embolic CVA. VFib arrest and MI with thrombotic occlusion of the ramus branch. Patient on inpatient Rehab and DC  today. On drive home became unresponsive. Wife drove to ED. Patient pulseless and unresponsive. Received 10 minutes of CPR and Epinephrine x 2. Intubated had ROSC. No defibrillation. Currently unresponsive on vent. Initial rhythm noted by EDP was bradycardia.   On exam BP 117/94. In Afib with rate 63. No JVD. Right EJ in place. intubated Lungs clear anteriorly CV IRR gr 2-3/6 harsh systolic murmur LSB Abd soft NT Skin. Chronic venous stasis changes in right leg. 1+ edema Neuro: startle reaction to noxious stimuli. Otherwise unresponsive. Upward gaze.   Ecg initially showed afib with marked ST depression in the anterolateral leads. I have personally reviewed and interpreted this study.  CXR and labs as noted above.  Impression: 1. Cardiac arrest either asystolic or PEA. No evidence of VF. Multiple possibilities including MI, CVA, PE, respiratory event. Patient has multiple co-morbidities.  - supportive care with ventilation. Current hemodynamics stable - cycle cardiac enzymes/Ecg - repeat Echo.  2. Acute respiratory failure post cardiac arrest - vent management per CCM 3. Chronic Afib given history of thrombotic events would favor IV heparin for now. Eliquis on hold 4. S/p TAVR 5. History of VFib arrest in setting of thrombotic ramus intermediate occlusion 6. History of embolic CVA.   Prognosis is guarded. Will follow for Neurologic recovery.  Gayathri Futrell [place], MDFACC 03/14/2019 4:21 PM

## 2019-03-13 NOTE — ED Notes (Signed)
Attempted report 

## 2019-03-13 NOTE — Progress Notes (Signed)
ANTICOAGULATION CONSULT NOTE - Initial Consult  Pharmacy Consult for heparin Indication: VTE  Allergies  Allergen Reactions  . Kayexalate [Polystyrene] Other (See Comments)    Recommended to avoid by GI due to ischemic gastropathy    Patient Measurements:   Heparin Dosing Weight: 106kg  Vital Signs: Temp: 98.6 F (37 C) (04/21 1520) Temp Source: Oral (04/21 1520) BP: 117/94 (04/21 1520) Pulse Rate: 63 (04/21 1520)  Labs: Recent Labs    03/10/2019 0555 03/14/2019 1201 03/16/2019 1325  HGB  --  9.0* 8.5*  HCT  --  30.8* 25.0*  PLT  --  172  --   LABPROT  --  21.4*  --   INR  --  1.9*  --   CREATININE 0.97 1.12  --   TROPONINI  --  0.06*  --     Estimated Creatinine Clearance: 81.4 mL/min (by C-G formula based on SCr of 1.12 mg/dL).   Medical History: Past Medical History:  Diagnosis Date  . Anemia    low iron  . Arthritis    bilateral knees  . Atrial fibrillation, chronic   . Chronic diastolic CHF (congestive heart failure) (HCC) 06/15/2018  . History of subdural hematoma   . Hyperlipidemia   . Hypertension   . Morbid obesity (HCC)   . Myocardial infarction (HCC)   . Normal coronary arteries    by cardiac catheterization performed 03/14/06  . Pre-diabetes    pt denies  . S/P TAVR (transcatheter aortic valve replacement)    a. 07/25/18: Edwards Sapien 3 THV (size 26 mm, model # 9600TFX, serial # [id number]) by Dr. Laneta Simmers and Dr. Clifton James  . Sleep apnea    on CPAP  . Stroke (HCC)   . Venous insufficiency   . VF (ventricular fibrillation) (HCC) causing cardiac arrest 02/03/2019     Assessment: 68 yoM on apixaban for hx PAF discharged from hospital today and subsequently had cardiopulmonary arrest. Pharmacy asked to start IV heparin. CT head today with no acute abnormalities. Pt took Eliquis this morning at 0850, so will hold heparin until 2030 tonight. Pt previously therapeutic on 1400 units/hr so will begin infusion here with no bolus.  Goal of Therapy:   Heparin level 0.3-0.7 units/ml aPTT 66-102 seconds Monitor platelets by anticoagulation protocol: Yes   Plan:  -Start IV heparin 1400 units/hr no bolus at 2030 -Check 8hr heparin level and aPTT   Fredonia Highland, PharmD, BCPS Clinical Pharmacist [phone] Please check AMION for all [facility] numbers 02/22/2019

## 2019-03-13 NOTE — Progress Notes (Signed)
  Echocardiogram 2D Echocardiogram has been performed.  Joel Rogers 2019/04/08, 4:13 PM

## 2019-03-13 NOTE — Consult Note (Signed)
NEURO HOSPITALIST CONSULT NOTE   Requestig physician: Dr. Valeta Harms  Reason for Consult: Myoclonus following cardiac arrest  History obtained from:  Chart     HPI:                                                                                                                                          TREYLAN Rogers is an 68 y.o. male with chronic atrial fibrillation, HTN, HLD, prior MI and chronic diastolic CHF, who was being driven home after discharge from rehab on Tuesday when he had sudden onset of unresponsiveness. EMS was called and arrived about 10 minutes after he had initially become unresponsive. He was pulseless and unresponsive on EMS arrival. ACLS was started, with ROSC after approximately 10 minutes. CT head showed no acute abnormality.   He had presented last admission with V-fib arrest. His last hospital stay was complicated by Staph bacteremia, cellulitis and GIB.   Past Medical History:  Diagnosis Date  . Anemia    low iron  . Arthritis    bilateral knees  . Atrial fibrillation, chronic   . Chronic diastolic CHF (congestive heart failure) ([place]) 06/15/2018  . History of subdural hematoma   . Hyperlipidemia   . Hypertension   . Morbid obesity ([place])   . Myocardial infarction ([place])   . Normal coronary arteries    by cardiac catheterization performed 03/14/06  . Pre-diabetes    pt denies  . S/P TAVR (transcatheter aortic valve replacement)    a. 07/25/18: Edwards Sapien 3 THV (size 26 mm, model # 9600TFX, serial # [id number]) by Dr. Cyndia Bent and Dr. Angelena Form  . Sleep apnea    on CPAP  . Stroke ([place])   . Venous insufficiency   . VF (ventricular fibrillation) ([place]) causing cardiac arrest 02/03/2019    Past Surgical History:  Procedure Laterality Date  . BIOPSY  01/26/2019   Procedure: BIOPSY;  Surgeon: Ronald Lobo, MD;  Location: [facility] ENDOSCOPY;  Service: Endoscopy;;  . CRANIOTOMY Right 08/27/2016   Procedure: CRANIOTOMY HEMATOMA EVACUATION SUBDURAL;   Surgeon: Erline Levine, MD;  Location: [place];  Service: Neurosurgery;  Laterality: Right;  . ESOPHAGOGASTRODUODENOSCOPY N/A 01/21/2019   Procedure: ESOPHAGOGASTRODUODENOSCOPY (EGD);  Surgeon: Clarene Essex, MD;  Location: [place];  Service: Endoscopy;  Laterality: N/A;  . ESOPHAGOGASTRODUODENOSCOPY (EGD) WITH PROPOFOL N/A 01/22/2019   Procedure: ESOPHAGOGASTRODUODENOSCOPY (EGD) WITH PROPOFOL;  Surgeon: Ronald Lobo, MD;  Location: [place];  Service: Endoscopy;  Laterality: N/A;  . ESOPHAGOGASTRODUODENOSCOPY (EGD) WITH PROPOFOL N/A 01/26/2019   Procedure: ESOPHAGOGASTRODUODENOSCOPY (EGD) WITH PROPOFOL;  Surgeon: Ronald Lobo, MD;  Location: [place];  Service: Endoscopy;  Laterality: N/A;  . GASTRIC BYPASS  09/2008  . JOINT REPLACEMENT  08/31/2006   right hip  . LEFT HEART CATH AND CORONARY ANGIOGRAPHY  N/A 02/05/2019   Procedure: LEFT HEART CATH AND CORONARY ANGIOGRAPHY;  Surgeon: Jettie Booze, MD;  Location: [place] CV LAB;  Service: Cardiovascular;  Laterality: N/A;  . MULTIPLE EXTRACTIONS WITH ALVEOLOPLASTY N/A 07/13/2018   Procedure: Extraction of tooth #'s 3,8,10, 23-26, 30and 32 with alveoloplasty and gross debridement of remaining teeth;  Surgeon: Lenn Cal, DDS;  Location: [place];  Service: Oral Surgery;  Laterality: N/A;  . RIGHT HEART CATH N/A 07/06/2018   Procedure: RIGHT HEART CATH;  Surgeon: Burnell Blanks, MD;  Location: [place] CV LAB;  Service: Cardiovascular;  Laterality: N/A;  . RIGHT/LEFT HEART CATH AND CORONARY ANGIOGRAPHY N/A 07/10/2018   Procedure: RIGHT/LEFT HEART CATH AND CORONARY ANGIOGRAPHY;  Surgeon: Jolaine Artist, MD;  Location: [employer] CV LAB;  Service: Cardiovascular;  Laterality: N/A;  . TEE WITHOUT CARDIOVERSION N/A 07/25/2018   Procedure: TRANSESOPHAGEAL ECHOCARDIOGRAM (TEE);  Surgeon: Burnell Blanks, MD;  Location: [place];  Service: Open Heart Surgery;  Laterality: N/A;  . TEE WITHOUT CARDIOVERSION N/A  01/18/2019   Procedure: TRANSESOPHAGEAL ECHOCARDIOGRAM (TEE) WITH PROPOFOL;  Surgeon: Satira Sark, MD;  Location: [facility];  Service: Cardiovascular;  Laterality: N/A;  . TOTAL HIP ARTHROPLASTY     right hip  . TRANSCATHETER AORTIC VALVE REPLACEMENT, TRANSFEMORAL  07/25/2018  . TRANSCATHETER AORTIC VALVE REPLACEMENT, TRANSFEMORAL N/A 07/25/2018   Procedure: TRANSCATHETER AORTIC VALVE REPLACEMENT, TRANSFEMORAL;  Surgeon: Burnell Blanks, MD;  Location: [place];  Service: Open Heart Surgery;  Laterality: N/A;    Family History  Problem Relation Age of Onset  . Hypertension Father   . Cancer Father        prob prostate  . Alzheimer's disease Mother   . Alzheimer's disease Maternal Grandfather   . Breast cancer Sister   . Cancer Brother        "brain tumor"              Social History:  reports that he has quit smoking. He has never used smokeless tobacco. He reports that he does not drink alcohol or use drugs.  Allergies  Allergen Reactions  . Kayexalate [Polystyrene] Other (See Comments)    Recommended to avoid by GI due to ischemic gastropathy    MEDICATIONS:                                                                                                                     Prior to Admission:  Medications Prior to Admission  Medication Sig Dispense Refill Last Dose  . acetaminophen (TYLENOL) 650 MG CR tablet Take 650 mg by mouth 2 (two) times daily as needed for pain.   Past Week at Unknown time  . amLODipine (NORVASC) 5 MG tablet Take 1 tablet (5 mg total) by mouth daily. 30 tablet 11   . apixaban (ELIQUIS) 5 MG TABS tablet Take 1 tablet (5 mg total) by mouth 2 (two) times daily. 180 tablet 1   . atorvastatin (LIPITOR) 80 MG tablet TAKE 1 TABLET  BY MOUTH ONCE DAILY WITH&nbsp;&nbsp;BREAKFAST (Patient taking differently: Take 80 mg by mouth daily with breakfast. ) 90 tablet 1   . colchicine 0.6 MG tablet Take 1 tablet (0.6 mg total) by mouth as needed (Gout flare up.).      Marland Kitchen furosemide (LASIX) 40 MG tablet Take 1 tablet (40 mg total) by mouth daily. 30 tablet 11   . metoprolol tartrate (LOPRESSOR) 50 MG tablet Take 1 tablet (50 mg total) by mouth 2 (two) times daily. 60 tablet 11   . nitroGLYCERIN (NITROSTAT) 0.4 MG SL tablet Place 1 tablet (0.4 mg total) under the tongue every 5 (five) minutes x 3 doses as needed for chest pain. 25 tablet 12   . pantoprazole (PROTONIX) 40 MG tablet Take 1 tablet (40 mg total) by mouth 2 (two) times daily. 60 tablet 0   . potassium chloride SA (K-DUR) 20 MEQ tablet Take 2 tablets (40 mEq total) by mouth daily. 30 tablet 0   . traMADol-acetaminophen (ULTRACET) 37.5-325 MG tablet Take 1-2 tablets by mouth every 6 (six) hours as needed for moderate pain or severe pain. 20 tablet 0    Scheduled: . chlorhexidine gluconate (MEDLINE KIT)  15 mL Mouth Rinse BID  . fentaNYL      . mouth rinse  15 mL Mouth Rinse 10 times per day  . midazolam      . pantoprazole (PROTONIX) IV  40 mg Intravenous QHS   Continuous: . ceFEPime (MAXIPIME) IV    . lactated ringers    . [START ON 03/14/2019] levETIRAcetam    . propofol (DIPRIVAN) infusion    . [START ON 03/14/2019] vancomycin       ROS:                                                                                                                                       Unable to obtain due to coma  Blood pressure (!) 101/54, pulse 71, temperature 98.6 F (37 C), temperature source Oral, resp. rate (!) 28, SpO2 100 %.   General Examination:                                                                                                       Physical Exam  HEENT-  /AT    Lungs- Intubated Extremities- No cyanosis or pallor. Pitting edema to distal BLE  Neurological Examination Mental Status: Comatose with GCS 3t. Not currently on continuous sedation but did recently have Versed 6 mg and Ativan  2 mg administered IO.  Cranial Nerves: II: Pupils 2 mm and unreactive bilaterally. No  blink to threat. III,IV, VI: Eyes conjugately at the midline. Eyelids abruptly elevate during myoclonic episodes (see below). No doll's eye reflex. No nystagmus.  V,VII: Face flaccidly symmetric. No response to brow ridge pressure VIII: No response to voice IX,X: Intubated XI: Head at midline XII: Intubated Motor: Flaccid tone x 4. Intermittent whole-body myoclonus involving all 4 limbs and trunk synchronously, about 1-2 jerks per 15 second period, on average. Jerks occur singly as well as in clusters. No movement to noxious stimuli.  Sensory: No reaction to any stimuli.  Deep Tendon Reflexes: 0 in all 4 extremities Plantars: Mute bilaterally  Cerebellar/Gait: Unable to assess    Lab Results: Basic Metabolic Panel: Recent Labs  Lab 03/09/19 1138 02/24/2019 0555 02/22/2019 1201 02/25/2019 1325 03/12/2019 1543  NA 136 136 136 137  --   K 4.8 4.3 5.0 4.2  --   CL 110 108 108  --   --   CO2 17* 20* 10*  --   --   GLUCOSE 105* 95 198*  --   --   BUN '13 12 12  '$ --   --   CREATININE 0.83 0.97 1.12  --   --   CALCIUM 8.7* 8.7* 8.9  --   --   MG  --   --   --   --  1.9  PHOS  --   --   --   --  4.2    CBC: Recent Labs  Lab 03/09/19 1138 03/17/2019 1201 03/12/2019 1325  WBC 10.9* 13.9*  --   NEUTROABS  --  8.9*  --   HGB 8.9* 9.0* 8.5*  HCT 28.3* 30.8* 25.0*  MCV 77.3* 85.1  --   PLT 234 172  --     Cardiac Enzymes: Recent Labs  Lab 02/22/2019 1201 03/10/2019 1543 02/22/2019 1828  TROPONINI 0.06* 0.22* 0.31*    Lipid Panel: No results for input(s): CHOL, TRIG, HDL, CHOLHDL, VLDL, LDLCALC in the last 168 hours.  Imaging: Ct Head Wo Contrast  Result Date: 02/23/2019 CLINICAL DATA:  Altered level of consciousness EXAM: CT HEAD WITHOUT CONTRAST TECHNIQUE: Contiguous axial images were obtained from the base of the skull through the vertex without intravenous contrast. COMPARISON:  February 06, 2019 and October 26, 2016 head CT examinations; brain MRI January 19, 2019 FINDINGS: Note that  there is a degree of motion artifact. Brain: There is stable age related volume loss. There is no intracranial mass, hemorrhage, extra-axial fluid collection, or midline shift. There is mild small vessel disease in the centra semiovale bilaterally. There is decreased attenuation in the periphery the inferior right cerebellum on the right consistent with prior infarct. No acute appearing infarct is evident currently. Vascular: There is no hyperdense vessel. There is calcification in each carotid siphon region. Skull: Evidence of previous craniotomy defect on the right. No acute appearing bony defects are evident. Sinuses/Orbits: Paranasal sinuses appear grossly clear. Note that there is underlying motion artifact. Orbits appear symmetric bilaterally with limitation due to motion artifact. Other: Mastoid air cells appear clear. IMPRESSION: Less than optimal study due to motion artifact. Prior infarcts and areas of small vessel disease present. No acute appearing infarct evident. No mass or hemorrhage currently evident. Apparent resolution of previous subarachnoid hemorrhage. Previous subdural hematoma drainage on the right with evidence of previous right-sided craniotomy. There are foci of arterial vascular calcification. Electronically Signed   By: Gwyndolyn Saxon  Jasmine December III M.D.   On: 03/15/2019 12:49   Dg Chest Portable 1 View  Result Date: 03/06/2019 CLINICAL DATA:  Cardiorespiratory arrest. Intubation. EXAM: PORTABLE CHEST 1 VIEW COMPARISON:  02/03/2019 and earlier. FINDINGS: Endotracheal tube tip in satisfactory position projecting approximately 5 cm above the carina. External pacing pads. Prior TAVR. Cardiac silhouette markedly enlarged, unchanged. Pulmonary venous hypertension without overt edema. Minimal patchy opacities at the RIGHT lung base and in the LEFT UPPER LOBE, new since the most recent prior examination. No visible pleural effusions. IMPRESSION: 1. Endotracheal tube tip in satisfactory position  projecting approximately 5 cm above the carina. 2. Stable marked cardiomegaly. Pulmonary venous hypertension without overt edema. 3. Minimal patchy atelectasis versus bronchopneumonia at the RIGHT lung base and in the LEFT upper lobe. Electronically Signed   By: Evangeline Dakin M.D.   On: 03/10/2019 12:22    Assessment: 68 year old male admitted to the ICU following out of hospital cardiac arrest. Currently comatose with lack of brainstem reflexes on exam. Intermittent full-body myoclonus is also noted.  1. No brainstem reflexes on exam. No limb movement to any stimuli. Intermittent full-body myoclonus is noted.  2. CT head: Prior infarcts and areas of small vessel disease present. No acute appearing infarct evident. No mass or hemorrhage currently evident. Apparent resolution of previous subarachnoid hemorrhage. Previous subdural hematoma drainage on the right with evidence of previous right-sided craniotomy. There are foci of arterial vascular calcification.  3. EEG: Low voltage background in all leads with intermittent myoclonic discharges occurring synchronously with the full-body myoclonus.  4. Myoclonus and low voltage EEG following cardiac arrest are generally associated with an extremely poor neurological prognosis.   Recommendations: 1. Keppra 2000 mg IV has been ordered, followed by 1000 mg IV BID. 2. No strong indication for continuous EEG given the flat background with all myoclonic discharges correlating with clinical myoclonus. Can follow myoclonus clinically.  3. Supportive care per ICU team.  4. Most likely with an extremely poor prognosis neurologically given myoclonus as well as a flat background on EEG following cardiac arrest.   50 minutes spent in the emergent neurological evaluation and management of this critically ill patient  Electronically signed: Dr. Kerney Elbe 03/18/2019, 7:53 PM

## 2019-03-13 NOTE — Progress Notes (Signed)
Discharge instructions given to Mrs. Joel Rogers via telephone from Deatra Ina, PA-C with verbal understanding. Patient discharged to home with belongings.

## 2019-03-13 NOTE — ED Triage Notes (Signed)
Pt pulled out of car by staff, pulseless and apneic, code team to bedside to initiate CPR.

## 2019-03-13 NOTE — Progress Notes (Signed)
Pharmacy Antibiotic Note  DILRAJ HERIN is a 68 y.o. male discharged on 04/09/19 AM from Rehab s/p treatment for Staph bacteremia/cellulitis and GIB.  Patient was on his way home when he became unresponsive and his wife drove him back to the ED.  He coded in the ED.  Pharmacy has been consulted for vancomycin and cefepime dosing for possible bronchopneumonia on CXR.  SCr 1.12, nCrCL 64 ml/min - afebrile, WBC elevated at 13.9.  Plan: Vanc 2gm IV x 1, then 1750mg  IV Q24H for AUC 509 using SCr 1.12 Cefepime 2gm IV Q8H Monitor renal fxn, clinical progress, vanc AUC as indicated     Temp (24hrs), Avg:98.5 F (36.9 C), Min:97.4 F (36.3 C), Max:99.1 F (37.3 C)  Recent Labs  Lab 03/09/19 1138 04/09/2019 0555 2019/04/09 1201  WBC 10.9*  --  13.9*  CREATININE 0.83 0.97  --     Estimated Creatinine Clearance: 94 mL/min (by C-G formula based on SCr of 0.97 mg/dL).    Allergies  Allergen Reactions  . Kayexalate [Polystyrene] Other (See Comments)    Recommended to avoid by GI due to ischemic gastropathy    Vanc 4/21 >> Cefepime 4/21 >>  4/21 BCx -   Deretha Ertle D. Laney Potash, PharmD, BCPS, BCCCP 09-Apr-2019, 1:25 PM

## 2019-03-13 NOTE — Progress Notes (Signed)
CRITICAL VALUE ALERT  Critical Value:  Lactic Acid 3.0  Date & Time Notied:  03/12/1499  Provider Notified: Dr. Tonia Brooms  Orders Received/Actions taken: No new orders given

## 2019-03-13 NOTE — Code Documentation (Signed)
To CT with RN and respiratory.  

## 2019-03-13 NOTE — Progress Notes (Signed)
PCCM Interval Note   Called to bedside for ongoing seizure like activity since around 1730, s/p versed 6 mg- total.  Additionally, patient has poor access, R EJ is not working, therefore they have been using left tibial IO.    EEG currently being connected.    Patient has ongoing full body intermittent jerking movements and upward gaze, otherwise unresponsive.  Glucose 126.    Concern for myoclonus vs SE  P:  Ativan 2mg  now Repeat CMP, Mag, phos Increase LR to 125 ml/hr Additional PIV placed, will attempt further access once jerking ceases Spoke with Dr. Otelia Limes who will see w/ pending EEG, advises to load with Keppra 2gm now Additional propofol gtt ordered- will defer to neurology   Posey Boyer, MSN, AGACNP-BC [facility] Pgr: [phone] or if no answer [phone] 03/10/2019, 8:02 PM

## 2019-03-13 NOTE — Progress Notes (Signed)
[place] PHYSICAL MEDICINE & REHABILITATION PROGRESS NOTE   Subjective/Complaints:  Ready to go home  ROS: Patient denies CP, SOB, N/V/D    Objective:   No results found. No results for input(s): WBC, HGB, HCT, PLT in the last 72 hours. Recent Labs    12/07/2018 0555  NA 136  K 4.3  CL 108  CO2 20*  GLUCOSE 95  BUN 12  CREATININE 0.97  CALCIUM 8.7*    Intake/Output Summary (Last 24 hours) at 01-10-2019 0915 Last data filed at 01-10-2019 0739 Gross per 24 hour  Intake 775 ml  Output 1250 ml  Net -475 ml     Physical Exam: Vital Signs Blood pressure 101/72, pulse (!) 51, temperature 98.4 F (36.9 C), resp. rate 19, height 6\' 1"  (1.854 m), weight 108.2 kg, SpO2 100 %. Constitutional: No distress . Vital signs reviewed. HEENT: EOMI, oral membranes moist Neck: supple Cardiovascular: RRR without murmur. No JVD    Respiratory: CTA Bilaterally without wheezes or rales. Normal effort    GI: BS +, non-tender, non-distended  Musc: Right-sided edema with RUE with kinesiotaping, right knee non-tender.  Neurologic: Alert and oriented Motor:  RUE: 3+/5 proximal distal, stable RLE: 2/5 proximal to distal, stable, LLE 3- Significant trunk weakness which affects sit to stand transfer Skin: No evidence of breakdown, no evidence of rash Psych:  pleasant.   Assessment/Plan: 1. Functional deficits secondary to cardioembolic bicerebral infarcts  Stable for D/C today F/u PCP in 3-4 weeks F/u PM&R 2 weeks See D/C summary See D/C instructions Care Tool:  Bathing    Body parts bathed by patient: Chest, Abdomen, Left upper leg, Face, Right upper leg, Right arm, Left arm, Front perineal area   Body parts bathed by helper: Buttocks, Right lower leg, Left lower leg     Bathing assist Assist Level: Moderate Assistance - Patient 50 - 74%     Upper Body Dressing/Undressing Upper body dressing Upper body dressing/undressing activity did not occur (including orthotics):  Refused What is the patient wearing?: Pull over shirt    Upper body assist Assist Level: Supervision/Verbal cueing    Lower Body Dressing/Undressing Lower body dressing      What is the patient wearing?: Pants     Lower body assist Assist for lower body dressing: Maximal Assistance - Patient 25 - 49%     Toileting Toileting Toileting Activity did not occur Press photographer(Clothing management and hygiene only): Refused  Toileting assist Assist for toileting: Maximal Assistance - Patient 25 - 49% Assistive Device Comment: drop arm    Transfers Chair/bed transfer  Transfers assist  Chair/bed transfer activity did not occur: Safety/medical concerns  Chair/bed transfer assist level: Contact Guard/Touching assist(lateral scoot )     Locomotion Ambulation   Ambulation assist   Ambulation activity did not occur: Safety/medical concerns(fatigue/balance/fall risk )  Assist level: Dependent - Patient 0%(maxi sky walking sling ) Assistive device: Maxi Sky Max distance: 283ft    Walk 10 feet activity   Assist  Walk 10 feet activity did not occur: Safety/medical concerns(fatigue/balance/fall risk )        Walk 50 feet activity   Assist Walk 50 feet with 2 turns activity did not occur: Safety/medical concerns(fatigue/balance/fall risk )         Walk 150 feet activity   Assist Walk 150 feet activity did not occur: Safety/medical concerns(fatigue/balance/fall risk )         Walk 10 feet on uneven surface  activity   Assist Walk 10 feet  on uneven surfaces activity did not occur: Safety/medical concerns(fatigue/balance/fall risk )         Wheelchair     Assist Will patient use wheelchair at discharge?: Yes Type of Wheelchair: Manual    Wheelchair assist level: Supervision/Verbal cueing Max wheelchair distance: 198ft    Wheelchair 50 feet with 2 turns activity    Assist    Wheelchair 50 feet with 2 turns activity did not occur: Safety/medical  concerns(limited by fatigue)   Assist Level: Supervision/Verbal cueing   Wheelchair 150 feet activity     Assist Wheelchair 150 feet activity did not occur: Safety/medical concerns(fatigue/balance/fall risk )          Medical Problem List and Plan: 1.Persistent right side weakness with decreased mobilitysecondary to cardioembolic bi-cerebral infarction HHPT, OT  Dc today   2. Antithrombotics: -DVT/anticoagulation:ELIQUIS -antiplatelet therapy:   3. Pain Management:Ultracet as needed, knee pain with weightbearing may be meniscal certainly no evidence of effusion 4. Mood:Provide emotional support  -appreciate neuropsych input re: coping with episodic stress/anxiety -antipsychotic agents: none 5. Neuropsych: This patientiscapable of making decisions on hisown behalf. 6. Skin/Wound Care:Routine skin checks 7. Fluids/Electrolytes/Nutrition:encourage PO 8. ABLA/ Upper GI bleed EGD 3. Follow-up GI services. Continue PPI -serial H/H's   -hgb 7.9 on 4/3--->8.9 4/17 improving discussed result with pt  -1u PRBC Wednesday- 4/1      10. Chronic atrial fibrillation with TAVR procedure September 2019 11. Non-STEMI. Thought secondary to thrombus status post cardiac catheterization. Continue eliquis 12. Acute on chronic diastolic congestive heart failure. Lasix 40 mg daily  Filed Weights   03/11/19 0531 03/12/19 0404 Apr 12, 2019 0549  Weight: 99.4 kg 104.1 kg 108.2 kg   , clinically no sign of overload  -appears clinically balanced from volume standpoint---  -continue to observe 13. Hypertension. Norvasc 5 mg daily, Lopressor 50 mg twice a day. Monitor with increased mobility Vitals:   03/12/19 2003 Apr 12, 2019 0550  BP:  101/72  Pulse: 94 (!) 51  Resp:  19  Temp: 98.9 F (37.2 C) 98.4 F (36.9 C)  SpO2: 98% 100%   controlled on 4/20  14. Hyperlipidemia. Lipitor 15. Persistent leukocytosis:improving   WBCs 10.9  4/17  Afebrile,     16.  Hypokalemia- improved on supplementation , 4.3 today  Potassium 4.8 4/17   -decreased kdur to daily  LOS: 29 days A FACE TO FACE EVALUATION WAS PERFORMED  Erick Colace 2019-04-12, 9:15 AM

## 2019-03-13 NOTE — ED Notes (Signed)
The pt was pulled from  Private vehicle front passenger seat by staff found to be unresponsive, placed on stretcher and LSB with multiple staff members assisting, chest compressions started immediately by RN while pt was transported to a trauma room, CPR continued, family placed in a conference room, pts wife states, "He was just released from here and I heard him gurgle and stop talking. He was like that for about 10 minutes while I drove here."

## 2019-03-13 NOTE — Code Documentation (Signed)
PT pulled out of car unresponsive by staff. On arrival pt no pulse, not breathing, CPR initially. cbg 172

## 2019-03-13 NOTE — Progress Notes (Signed)
EEG Complete  Results Pending 

## 2019-03-13 NOTE — Progress Notes (Signed)
PCCM:  Called by nursing at bedside. Concerned for seizure like activity. Versed given  EEG ordered.  Josephine Igo, DO [facility] Apr 03, 2019 5:38 PM

## 2019-03-13 NOTE — ED Provider Notes (Signed)
[facility] EMERGENCY DEPARTMENT Provider Note   CSN: [id number] Arrival date & time: 03/07/2019  1133  LEVEL 5 CAVEAT - UNRESPONSIVE/CPR  History   Chief Complaint No chief complaint on file.   HPI Claudie LeachRalph L Okey is a 68 y.o. male.     HPI  68 year old male brought in by family for unresponsive episode.  According to the wife, who I talked to after the initial resuscitation, the patient just got out of the [facility] this morning and as they were driving home, he all of a sudden stopped talking and was unresponsive.  [facility] and this was about a 10-minute drive.  When the technician was going to help the patient get out of the car she noticed he did not have a pulse and CPR was started.  Approximately 5 rounds of CPR performed total.  No other significant history besides chart biopsy.  Past Medical History:  Diagnosis Date  . Anemia    low iron  . Arthritis    bilateral knees  . Atrial fibrillation, chronic   . Chronic diastolic CHF (congestive heart failure) (HCC) 06/15/2018  . History of subdural hematoma   . Hyperlipidemia   . Hypertension   . Morbid obesity (HCC)   . Myocardial infarction (HCC)   . Normal coronary arteries    by cardiac catheterization performed 03/14/06  . Pre-diabetes    pt denies  . S/P TAVR (transcatheter aortic valve replacement)    a. 07/25/18: Edwards Sapien 3 THV (size 26 mm, model # 9600TFX, serial # [id number]) by Dr. Laneta SimmersBartle and Dr. Clifton JamesMcAlhany  . Sleep apnea    on CPAP  . Stroke (HCC)   . Venous insufficiency   . VF (ventricular fibrillation) (HCC) causing cardiac arrest 02/03/2019    Patient Active Problem List   Diagnosis Date Noted  . Hypokalemia   . Leukocytosis   . Uncontrolled stage 2 hypertension   . Acute on chronic diastolic (congestive) heart failure (HCC)   . Acute blood loss anemia   . Posterior circulation stroke (HCC) 02/12/2019  . Cerebral embolism with cerebral infarction  02/09/2019  . Malnutrition of moderate degree 02/08/2019  . Pressure injury of skin 02/07/2019  . VT (ventricular tachycardia) (HCC)   . Acute coronary syndrome (HCC) 02/03/2019  . VF (ventricular fibrillation) (HCC) causing cardiac arrest 02/03/2019  . Acute bilateral cerebral infarction in a watershed distribution [facility](HCC) 01/30/2019  . UGIB (upper gastrointestinal bleed) 01/26/2019  . Bacteremia due to Streptococcus 01/26/2019  . Cellulitis 01/26/2019  . Upper GI bleed   . Acute CVA (cerebrovascular accident) (HCC) 01/19/2019  . Acute renal failure (HCC) 01/14/2019  . Right shoulder pain 01/14/2019  . Sepsis (HCC) 01/13/2019  . S/P TAVR (transcatheter aortic valve replacement) 07/25/2018  . History of subdural hematoma   . Atrial fibrillation, chronic   . Acute on chronic diastolic heart failure (HCC) 06/15/2018  . Pulmonary hypertension, unspecified (HCC) 02/15/2018  . Varicose veins of right lower extremity with complications 01/10/2018  . History of Severe aortic stenosis 08/09/2017  . Vitamin D deficiency 12/02/2016  . Status post gastric bypass for obesity 11/02/2016  . HTN (hypertension) 04/13/2013  . Hyperlipemia 04/13/2013  . Chronic atrial fibrillation 02/14/2013  . Non-ST elevation (NSTEMI) myocardial infarction (HCC) 06/25/2008  . Morbid obesity (HCC) 06/18/2008  . Obstructive sleep apnea 06/18/2008    Past Surgical History:  Procedure Laterality Date  . BIOPSY  01/26/2019   Procedure: BIOPSY;  Surgeon: Bernette RedbirdBuccini, Robert,  MD;  Location: [facility] ENDOSCOPY;  Service: Endoscopy;;  . CRANIOTOMY Right 08/27/2016   Procedure: CRANIOTOMY HEMATOMA EVACUATION SUBDURAL;  Surgeon: Maeola Harman, MD;  Location[facility];  Service: Neurosurgery;  Laterality: Right;  . ESOPHAGOGASTRODUODENOSCOPY N/A 01/21/2019   Procedure: ESOPHAGOGASTRODUODENOSCOPY (EGD);  Surgeon: Vida Rigger, MD;  Location: [facility] ENDOSCOPY;  Service: Endoscopy;  Laterality: N/A;  . ESOPHAGOGASTRODUODENOSCOPY (EGD) WITH PROPOFOL  N/A 01/22/2019   Procedure: ESOPHAGOGASTRODUODENOSCOPY (EGD) WITH PROPOFOL;  Surgeon: Bernette Redbird, MD;  Location: [facility]COPY;  Service: Endoscopy;  Laterality: N/A;  . ESOPHAGOGASTRODUODENOSCOPY (EGD) WITH PROPOFOL N/A 01/26/2019   Procedure: ESOPHAGOGASTRODUODENOSCOPY (EGD) WITH PROPOFOL;  Surgeon: Bernette Redbird, MD;  Location: [facility] ENDOSCOPY;  Service: Endoscopy;  Laterality: N/A;  . GASTRIC BYPASS  09/2008  . JOINT REPLACEMENT  08/31/2006   right hip  . LEFT HEART CATH AND CORONARY ANGIOGRAPHY N/A 02/05/2019   Procedure: LEFT HEART CATH AND CORONARY ANGIOGRAPHY;  Surgeon: Corky Crafts, MD;  Location: [facility] INVASIVE CV LAB;  Service: Cardiovascular;  Laterality: N/A;  . MULTIPLE EXTRACTIONS WITH ALVEOLOPLASTY N/A 07/13/2018   Procedure: Extraction of tooth #'s 3,8,10, 23-26, 30and 32 with alveoloplasty and gross debridement of remaining teeth;  Surgeon: Charlynne Pander, DDS;  Location: [facility] OR;  Service: Oral Surgery;  Laterality: N/A;  . RIGHT HEART CATH N/A 07/06/2018   Procedure: RIGHT HEART CATH;  Surgeon: Kathleene Hazel, MD;  Location: [facility] INVASIVE CV LAB;  Service: Cardiovascular;  Laterality: N/A;  . RIGHT/LEFT HEART CATH AND CORONARY ANGIOGRAPHY N/A 07/10/2018   Procedure: RIGHT/LEFT HEART CATH AND CORONARY ANGIOGRAPHY;  Surgeon: Dolores Patty, MD;  Location: [facility] INVASIVE CV LAB;  Service: Cardiovascular;  Laterality: N/A;  . TEE WITHOUT CARDIOVERSION N/A 07/25/2018   Procedure: TRANSESOPHAGEAL ECHOCARDIOGRAM (TEE);  Surgeon: Kathleene Hazel, MD;  Location: [facility] OR;  Service: Open Heart Surgery;  Laterality: N/A;  . TEE WITHOUT CARDIOVERSION N/A 01/18/2019   Procedure: TRANSESOPHAGEAL ECHOCARDIOGRAM (TEE) WITH PROPOFOL;  Surgeon: Jonelle Sidle, MD;  Location: [facility] ORS;  Service: Cardiovascular;  Laterality: N/A;  . TOTAL HIP ARTHROPLASTY     right hip  . TRANSCATHETER AORTIC VALVE REPLACEMENT, TRANSFEMORAL  07/25/2018  . TRANSCATHETER AORTIC VALVE REPLACEMENT,  TRANSFEMORAL N/A 07/25/2018   Procedure: TRANSCATHETER AORTIC VALVE REPLACEMENT, TRANSFEMORAL;  Surgeon: Kathleene Hazel, MD;  Location: [facility] OR;  Service: Open Heart Surgery;  Laterality: N/A;        Home Medications    Prior to Admission medications   Medication Sig Start Date End Date Taking? Authorizing Provider  acetaminophen (TYLENOL) 650 MG CR tablet Take 650 mg by mouth 2 (two) times daily as needed for pain.    [provider]  amLODipine (NORVASC) 5 MG tablet Take 1 tablet (5 mg total) by mouth daily. 03/24/19   Angiulli, Mcarthur Rossetti, PA-C  apixaban (ELIQUIS) 5 MG TABS tablet Take 1 tablet (5 mg total) by mouth 2 (two) times daily. 2019/03/24   Angiulli, Mcarthur Rossetti, PA-C  atorvastatin (LIPITOR) 80 MG tablet TAKE 1 TABLET BY MOUTH ONCE DAILY WITH&nbsp;&nbsp;BREAKFAST 03/24/19   Angiulli, Mcarthur Rossetti, PA-C  colchicine 0.6 MG tablet Take 1 tablet (0.6 mg total) by mouth as needed (Gout flare up.). 02/12/19   Barrett, Joline Salt, PA-C  furosemide (LASIX) 40 MG tablet Take 1 tablet (40 mg total) by mouth daily. 03-24-19   Angiulli, Mcarthur Rossetti, PA-C  metoprolol tartrate (LOPRESSOR) 50 MG tablet Take 1 tablet (50 mg total) by mouth 2 (two) times daily. 03-24-19   Angiulli, Mcarthur Rossetti, PA-C  nitroGLYCERIN (NITROSTAT) 0.4 MG SL tablet Place 1 tablet (0.4 mg total) under the tongue every 5 (five) minutes x 3 doses as needed for chest pain. 02/12/19   Barrett, Joline Salt, PA-C  pantoprazole (PROTONIX) 40 MG tablet Take 1 tablet (40 mg total) by mouth 2 (two) times daily. 03/19/2019   Angiulli, Mcarthur Rossetti, PA-C  potassium chloride SA (K-DUR) 20 MEQ tablet Take 2 tablets (40 mEq total) by mouth daily. 2019/03/19   Angiulli, Mcarthur Rossetti, PA-C  traMADol-acetaminophen (ULTRACET) 37.5-325 MG tablet Take 1-2 tablets by mouth every 6 (six) hours as needed for moderate pain or severe pain. March 19, 2019   Angiulli, Mcarthur Rossetti, PA-C    Family History Family History  Problem Relation Age of Onset  . Hypertension Father   . Cancer  Father        prob prostate  . Alzheimer's disease Mother   . Alzheimer's disease Maternal Grandfather   . Breast cancer Sister   . Cancer Brother        "brain tumor"    Social History Social History   Tobacco Use  . Smoking status: Former Games developer  . Smokeless tobacco: Never Used  Substance Use Topics  . Alcohol use: No  . Drug use: No     Allergies   Kayexalate [polystyrene]   Review of Systems Review of Systems  Unable to perform ROS: Patient unresponsive     Physical Exam Updated Vital Signs BP (!) 159/104   Pulse 98   Temp 99.1 F (37.3 C) (Rectal)   Resp 19   SpO2 100%   Physical Exam Vitals signs and nursing note reviewed.  Constitutional:      Appearance: He is well-developed. He is obese.     Comments: Active CPR when I walk into the room, being bag valve mask ventilated  HENT:     Head: Normocephalic and atraumatic.     Right Ear: External ear normal.     Left Ear: External ear normal.     Nose: Nose normal.  Eyes:     General:        Right eye: No discharge.        Left eye: No discharge.  Neck:     Musculoskeletal: Neck supple.  Abdominal:     Palpations: Abdomen is soft.  Skin:    General: Skin is warm and dry.  Neurological:     GCS: GCS eye subscore is 1. GCS verbal subscore is 1. GCS motor subscore is 1.  Psychiatric:        Mood and Affect: Mood is not anxious.      ED Treatments / Results  Labs (all labs ordered are listed, but only abnormal results are displayed) Labs Reviewed  CBG MONITORING, ED - Abnormal; Notable for the following components:      Result Value   Glucose-Capillary 172 (*)    All other components within normal limits  CULTURE, BLOOD (ROUTINE X 2)  CULTURE, BLOOD (ROUTINE X 2)  PROTIME-INR  CBC WITH DIFFERENTIAL/PLATELET  TROPONIN I  BRAIN NATRIURETIC PEPTIDE  COMPREHENSIVE METABOLIC PANEL  ETHANOL  RAPID URINE DRUG SCREEN, HOSP PERFORMED  URINALYSIS, ROUTINE W REFLEX MICROSCOPIC  I-STAT  CREATININE, ED  TYPE AND SCREEN    EKG None  Radiology Dg Chest Portable 1 View  Result Date: March 19, 2019 CLINICAL DATA:  Cardiorespiratory arrest. Intubation. EXAM: PORTABLE CHEST 1 VIEW COMPARISON:  02/03/2019 and earlier. FINDINGS: Endotracheal tube tip in satisfactory position projecting approximately 5 cm above the carina. External  pacing pads. Prior TAVR. Cardiac silhouette markedly enlarged, unchanged. Pulmonary venous hypertension without overt edema. Minimal patchy opacities at the RIGHT lung base and in the LEFT UPPER LOBE, new since the most recent prior examination. No visible pleural effusions. IMPRESSION: 1. Endotracheal tube tip in satisfactory position projecting approximately 5 cm above the carina. 2. Stable marked cardiomegaly. Pulmonary venous hypertension without overt edema. 3. Minimal patchy atelectasis versus bronchopneumonia at the RIGHT lung base and in the LEFT upper lobe. Electronically Signed   By: Hulan Saas M.D.   On: 03-29-2019 12:22    Procedures Procedure Name: Intubation Date/Time: 2019/03/29 12:32 PM Performed by: Pricilla Loveless, MD Oxygen Delivery Method: Ambu bag Laryngoscope Size: Glidescope and 3 Grade View: Grade III Tube size: 7.5 mm Number of attempts: 1 Airway Equipment and Method: Video-laryngoscopy Placement Confirmation: ETT inserted through vocal cords under direct vision,  CO2 detector and Breath sounds checked- equal and bilateral Secured at: 22 cm Tube secured with: ETT holder Dental Injury: Teeth and Oropharynx as per pre-operative assessment     IO LINE INSERTION Date/Time: 03-29-19 12:33 PM Performed by: Pricilla Loveless, MD Authorized by: Pricilla Loveless, MD   Consent:    Consent obtained:  Emergent situation Pre-procedure details:    Site preparation:  Alcohol Anesthesia (see MAR for exact dosages):    Anesthesia method:  None Procedure details:    Insertion site:  L proximal tibia   Insertion device:   Impact-driven device   Insertion: Needle was inserted through the bony cortex     Number of attempts:  1   Insertion confirmation:  Aspiration of blood/marrow, easy infusion of fluids and stability of the needle Post-procedure details:    Patient tolerance of procedure:  Tolerated well, no immediate complications .Critical Care Performed by: Pricilla Loveless, MD Authorized by: Pricilla Loveless, MD   Critical care provider statement:    Critical care time (minutes):  45   Critical care time was exclusive of:  Separately billable procedures and treating other patients   Critical care was necessary to treat or prevent imminent or life-threatening deterioration of the following conditions:  Cardiac failure and CNS failure or compromise   Critical care was time spent personally by me on the following activities:  Discussions with consultants, evaluation of patient's response to treatment, examination of patient, ordering and performing treatments and interventions, ordering and review of laboratory studies, ordering and review of radiographic studies, pulse oximetry, re-evaluation of patient's condition, obtaining history from patient or surrogate, review of old charts and ventilator management   (including critical care time)  Angiocath insertion Performed by: Audree Camel  Consent: Verbal consent obtained. Risks and benefits: risks, benefits and alternatives were discussed Time out: Immediately prior to procedure a "time out" was called to verify the correct patient, procedure, equipment, support staff and site/side marked as required.  Preparation: Patient was prepped and draped in the usual sterile fashion.  Vein Location: right EJ  Gauge: 18  Normal blood return and flush without difficulty Patient tolerance: Patient tolerated the procedure well with no immediate complications.  Cardiopulmonary Resuscitation (CPR) Procedure Note Directed/Performed by: Audree Camel I  personally directed ancillary staff and/or performed CPR in an effort to regain return of spontaneous circulation and to maintain cardiac, neuro and systemic perfusion.       Medications Ordered in ED Medications  fentaNYL (SUBLIMAZE) injection 50 mcg (has no administration in time range)  fentaNYL (SUBLIMAZE) injection 50 mcg (has no administration in time range)  fentaNYL (  SUBLIMAZE) injection 50 mcg (has no administration in time range)  midazolam (VERSED) injection 1 mg (has no administration in time range)  midazolam (VERSED) injection 1 mg (has no administration in time range)  fentaNYL (SUBLIMAZE) 100 MCG/2ML injection (has no administration in time range)  EPINEPHrine (ADRENALIN) 1 MG/10ML injection (1 Syringe Intravenous Given 03/27/2019 1143)  atropine injection (1 mg Intravenous Given 03-27-2019 1146)  norepinephrine (LEVOPHED) injection (11.553 mcg/kg/min  108.2 kg Intravenous New Bag/Given 03/27/19 1208)     Initial Impression / Assessment and Plan / ED Course  I have reviewed the triage vital signs and the nursing notes.  Pertinent labs & imaging results that were available during my care of the patient were reviewed by me and considered in my medical decision making (see chart for details).        Patient brought in and is in cardiac arrest.  CPR had been started prior to me arriving and then continued.  He received 2 total epinephrine doses.  Had return of spontaneous circulation but bradycardia and was given atropine with good response.  Briefly was hypotensive and given Levophed but then this improved.  Unclear exact cause of his cardiac arrest.  ECG with repolarization changes but this is probably related to being down rather than an acute coronary event.  He did have a V. fib arrest in the past though there was no evidence of V. fib during the resuscitation today.  However a primary cardiac event is much more likely given the no other acute findings and the sudden  presentation today.  ICU has been consulted and will admit.  They asked not to cool at this time.  Final Clinical Impressions(s) / ED Diagnoses   Final diagnoses:  Cardiac arrest (HCC)  Acute respiratory failure, unspecified whether with hypoxia or hypercapnia [facility])    ED Discharge Orders    None       Pricilla Loveless, MD 03-27-2019 1537

## 2019-03-13 NOTE — Progress Notes (Signed)
Notified Dr. Tonia Brooms regarding constant tremor/seizure-like activity and Lactic acid lab value. Orders given and initiated.

## 2019-03-14 ENCOUNTER — Encounter: Payer: Medicare Other | Admitting: Family Medicine

## 2019-03-14 ENCOUNTER — Inpatient Hospital Stay (HOSPITAL_COMMUNITY): Payer: Medicare Other

## 2019-03-14 DIAGNOSIS — G253 Myoclonus: Secondary | ICD-10-CM

## 2019-03-14 DIAGNOSIS — I469 Cardiac arrest, cause unspecified: Secondary | ICD-10-CM

## 2019-03-14 DIAGNOSIS — E872 Acidosis: Secondary | ICD-10-CM

## 2019-03-14 LAB — CBC
HCT: 27.2 % — ABNORMAL LOW (ref 39.0–52.0)
Hemoglobin: 8.9 g/dL — ABNORMAL LOW (ref 13.0–17.0)
MCH: 24.7 pg — ABNORMAL LOW (ref 26.0–34.0)
MCHC: 32.7 g/dL (ref 30.0–36.0)
MCV: 75.6 fL — ABNORMAL LOW (ref 80.0–100.0)
Platelets: 191 10*3/uL (ref 150–400)
RBC: 3.6 MIL/uL — ABNORMAL LOW (ref 4.22–5.81)
RDW: 19 % — ABNORMAL HIGH (ref 11.5–15.5)
WBC: 17.5 10*3/uL — ABNORMAL HIGH (ref 4.0–10.5)
nRBC: 0 % (ref 0.0–0.2)

## 2019-03-14 LAB — BASIC METABOLIC PANEL
Anion gap: 14 (ref 5–15)
BUN: 17 mg/dL (ref 8–23)
CO2: 15 mmol/L — ABNORMAL LOW (ref 22–32)
Calcium: 8.5 mg/dL — ABNORMAL LOW (ref 8.9–10.3)
Chloride: 108 mmol/L (ref 98–111)
Creatinine, Ser: 1.22 mg/dL (ref 0.61–1.24)
GFR calc Af Amer: >60 mL/min (ref >60)
GFR calc non Af Amer: >60 mL/min (ref >60)
Glucose, Bld: 130 mg/dL — ABNORMAL HIGH (ref 70–99)
Potassium: 4.6 mmol/L (ref 3.5–5.1)
Sodium: 137 mmol/L (ref 135–145)

## 2019-03-14 LAB — APTT
aPTT: 40 seconds — ABNORMAL HIGH (ref 24–36)
aPTT: 99 seconds — ABNORMAL HIGH (ref 24–36)
aPTT: 84 seconds — ABNORMAL HIGH (ref 24–36)

## 2019-03-14 LAB — LACTIC ACID, PLASMA: Lactic Acid, Venous: 2.9 mmol/L (ref 0.5–1.9)

## 2019-03-14 LAB — HEPARIN LEVEL (UNFRACTIONATED)
Heparin Unfractionated: 1.74 IU/mL — ABNORMAL HIGH (ref 0.30–0.70)
Heparin Unfractionated: 1.52 IU/mL — ABNORMAL HIGH (ref 0.30–0.70)

## 2019-03-14 LAB — MAGNESIUM: Magnesium: 1.8 mg/dL (ref 1.7–2.4)

## 2019-03-14 LAB — PHOSPHORUS: Phosphorus: 3.9 mg/dL (ref 2.5–4.6)

## 2019-03-14 LAB — TROPONIN I: Troponin I: 0.3 ng/mL (ref <0.03)

## 2019-03-14 LAB — GLUCOSE, CAPILLARY
Glucose-Capillary: 122 mg/dL — ABNORMAL HIGH (ref 70–99)
Glucose-Capillary: 123 mg/dL — ABNORMAL HIGH (ref 70–99)

## 2019-03-14 LAB — TRIGLYCERIDES: Triglycerides: 46 mg/dL (ref <150)

## 2019-03-14 MED ORDER — HEPARIN (PORCINE) 25000 UT/250ML-% IV SOLN
1200.0000 [IU]/h | INTRAVENOUS | Status: DC
  Administered 2019-03-14 – 2019-03-16 (×5): 1400 [IU]/h via INTRAVENOUS
  Administered 2019-03-17: 12:00:00 1200 [IU]/h via INTRAVENOUS
  Filled 2019-03-14 (×6): qty 250

## 2019-03-14 MED ORDER — MIDAZOLAM 50MG/50ML (1MG/ML) PREMIX INFUSION
0.0000 mg/h | INTRAVENOUS | Status: DC
  Administered 2019-03-14: 3 mg/h via INTRAVENOUS
  Administered 2019-03-14: 02:00:00 2 mg/h via INTRAVENOUS
  Administered 2019-03-14 – 2019-03-15 (×3): 3 mg/h via INTRAVENOUS
  Administered 2019-03-17: 2 mg/h via INTRAVENOUS
  Filled 2019-03-14 (×8): qty 50

## 2019-03-14 MED ORDER — PRO-STAT SUGAR FREE PO LIQD
60.0000 mL | Freq: Four times a day (QID) | ORAL | Status: DC
  Administered 2019-03-14 – 2019-03-16 (×9): 60 mL
  Filled 2019-03-14 (×10): qty 60

## 2019-03-14 MED ORDER — MAGNESIUM SULFATE 2 GM/50ML IV SOLN
2.0000 g | Freq: Once | INTRAVENOUS | Status: AC
  Administered 2019-03-14: 2 g via INTRAVENOUS
  Filled 2019-03-14: qty 50

## 2019-03-14 MED ORDER — SODIUM BICARBONATE 8.4 % IV SOLN
50.0000 meq | Freq: Once | INTRAVENOUS | Status: AC
  Administered 2019-03-14: 50 meq via INTRAVENOUS
  Filled 2019-03-14: qty 50

## 2019-03-14 MED ORDER — VITAL AF 1.2 CAL PO LIQD
1000.0000 mL | ORAL | Status: DC
  Administered 2019-03-14: 1000 mL

## 2019-03-14 MED ORDER — LACTATED RINGERS IV SOLN
INTRAVENOUS | Status: AC
  Administered 2019-03-14: 09:00:00 via INTRAVENOUS

## 2019-03-14 MED ORDER — MIDAZOLAM BOLUS VIA INFUSION
1.0000 mg | INTRAVENOUS | Status: DC | PRN
  Administered 2019-03-14 (×2): 2 mg via INTRAVENOUS
  Filled 2019-03-14: qty 2

## 2019-03-14 NOTE — Progress Notes (Signed)
RT NOTE: RT transported patient on ventilator from room 2H06 to MRI and back to room 2H06 with no apparent complications. Vitals are stable. RT will continue to monitor.

## 2019-03-14 NOTE — Progress Notes (Addendum)
NAME:  Joel Rogers, MRN:  [id number], DOB:  03-Aug-1951, LOS: 1 ADMISSION DATE:  04/04/19, CONSULTATION DATE:  04-Apr-2019 REFERRING MD: Criss Alvine, EM  , CHIEF COMPLAINT:  Cardiac arrest   Brief History   68 yo M discharged today from rehab, became unresponsive while being driven home for about 10 minutes until arriving to ED. At ED found to be pulseless, unresponsive. ACLS initiated, ROSC after approx 10 minutes. Now intubated. CT Head no acute process.   Past Medical History   Anemia     low iron  . Arthritis    bilateral knees  . Atrial fibrillation, chronic   . Chronic diastolic CHF (congestive heart failure) (HCC) 06/15/2018  . History of subdural hematoma   . Hyperlipidemia   . Hypertension   . Morbid obesity (HCC)   . Normal coronary arteries    by cardiac catheterization performed 03/14/06  . Pre-diabetes    pt denies  . S/P TAVR (transcatheter aortic valve replacement)    a. 07/25/18: Edwards Sapien 3 THV (size 26 mm, model # 9600TFX, serial # [id number]) by Dr. Laneta Simmers and Dr. Clifton James  . Sleep apnea    on CPAP  . Venous insufficiency     Significant Hospital Events   4/21> 10 minutes ACLS with ROSC. Intubated. CT Head no acute process. Admit to ICU   Consults:  PCCM  Neuro Cardiology  Procedures:  ETT 4/21>   Significant Diagnostic Tests:  CT Head 4/21> no acute abnormality. Prior SDH drainage on right.  TTE 4/21 >  EF >65% and no WMA, small pericardial effusion, otherwise no changes. Biatrial dilation.  Micro Data:  4/21 BCx> 4/21 Tracheal aspirate >   Antimicrobials:  4/21 vanc/cefepime>   Interim history/subjective:  Developed myoclonic activity overnight refractory to Ativan and Versed.  EEG ordered and neurology consulted.  Keppra started overnight. PCCM updated wife on patient's clinical condition.  Objective   Blood pressure 124/75, pulse 67, temperature 98.1 F (36.7 C), temperature source Oral, resp. rate 17, height 6\' 1"   (1.854 m), weight 103.6 kg, SpO2 100 %.    Vent Mode: PSV;CPAP FiO2 (%):  [40 %-100 %] 40 % Set Rate:  [16 bmp] 16 bmp Vt Set:  [630 mL] 630 mL PEEP:  [5 cmH20] 5 cmH20 Pressure Support:  [15 cmH20] 15 cmH20 Plateau Pressure:  [19 cmH20-22 cmH20] 22 cmH20   Intake/Output Summary (Last 24 hours) at 03/14/2019 0813 Last data filed at 03/14/2019 0700 Gross per 24 hour  Intake 3205.52 ml  Output 704 ml  Net 2501.52 ml   Filed Weights   03/14/19 0500  Weight: 103.6 kg   Physical Exam: General: Chronically ill-appearing, sedated HENT: Delta, AT, ETT in place Eyes: EOMI, no scleral icterus Respiratory: Clear to auscultation bilaterally.  No crackles, wheezing or rales Cardiovascular: Irregular rhythm, murmur present, no JVD GI: BS+, soft, nontender Extremities: 1+ pitting lower extremity bilaterally ,-tenderness Neuro: Sedated, pupils deviated upwards, pinpoint pupils GU: Foley in place  Resolved Hospital Problem list     Assessment & Plan:  Mr. Ennis Witkowski is a 68 year old male with multiple comorbidities who became unresponsive on the day of discharge from rehab.  Wife brought him to the ED and he was found in cardiac arrest.  Patient was pulled from car and CPR started.  Approximate downtime was 10 minutes in the car and 10 minutes in the ED, epi x2 in ED.  Interval development of full-body myoclonus concerning for anoxic brain injury.  S/p Cardiac Arrest Atrial  fibrillation S/p TAVR CAD with lateral ramus lesion stenosis (99%) HTN Received 10 minute CPR, 2x epi with rosc.prior history of VFib arrest.  Peak troponin 0.31. TTE with normal EF and no WMA, small pericardial effusion, otherwise no changes. -Cardiology consulted -Heparin drip -Telemetry -Hold anti-hypertensive agents  Intermittent myoclonus Post arrest myoclonus refractory to benzodiazepines. CT head no acute infarct. EEG reviewed by neurology with low-voltage background charges occurring synchronously with full  body myoclonus.  Findings likely associated with poor prognosis. -Keppra per neurology -Versed gtt -EEG pending -Discuss with Neuro CTH vs MRI  Acute respiratory failure  Secondary to cardiac arrest -Full vent support -VAP -F/u trach asp, cultures  Acute encephalopathy History of recent embolic CVA, hypoxic ischemic encephalopathy CT H 4/21 negative for acute process -PAD protocol, RASS goal 0, -1   Anion gap acidosis secondary to lactic acidosis Lactic acidosis likely related to recent tissue hypoperfusion -Trend LA -IVF  History of Staph bacteremia/cellulitis S/p abx treatment -Follow up BCx, tracheal aspirate -Continue broad spectrum antibiotics. De-escalate pending culture data  Best practice:  Diet: NPO  Pain/Anxiety/Delirium protocol (if indicated): Versed gtt. PRN fentanyl/versed  VAP protocol (if indicated): yes  DVT prophylaxis: Lovenox  GI prophylaxis: Protonix  Glucose control: SSI  Mobility: bedrest  Code Status: full  Family Communication: Plan to update family Disposition: admit to ICU   Labs   CBC: Recent Labs  Lab 03/09/19 1138 04-12-19 1201 Apr 12, 2019 1325 2019/04/12 2033  WBC 10.9* 13.9*  --   --   NEUTROABS  --  8.9*  --   --   HGB 8.9* 9.0* 8.5* 8.5*  HCT 28.3* 30.8* 25.0* 25.0*  MCV 77.3* 85.1  --   --   PLT 234 172  --   --     Basic Metabolic Panel: Recent Labs  Lab 03/09/19 1138 04-12-19 0555 2019-04-12 1201 04/12/2019 1325 Apr 12, 2019 1543 2019/04/12 1953 Apr 12, 2019 2033 03/14/19 0351  NA 136 136 136 137  --  137 137 137  K 4.8 4.3 5.0 4.2  --  4.4 4.3 4.6  CL 110 108 108  --   --  110  --  108  CO2 17* 20* 10*  --   --  17*  --  15*  GLUCOSE 105* 95 198*  --   --  150*  --  130*  BUN --   --  17  --  17  CREATININE 0.83 0.97 1.12  --   --  1.26*  --  1.22  CALCIUM 8.7* 8.7* 8.9  --   --  8.4*  --  8.5*  MG  --   --   --   --  1.9 1.9  --  1.8  PHOS  --   --   --   --  4.2 4.1  --  3.9   GFR: Estimated Creatinine  Clearance: 73.3 mL/min (by C-G formula based on SCr of 1.22 mg/dL). Recent Labs  Lab 03/09/19 1138 04/12/2019 1201 04-12-19 1543 Apr 12, 2019 1828  PROCALCITON  --   --  0.60  --   WBC 10.9* 13.9*  --   --   LATICACIDVEN  --   --  3.0* 3.4*    Liver Function Tests: Recent Labs  Lab Apr 12, 2019 1201 Apr 12, 2019 1953  AST 38 58*  ALT 21 33  ALKPHOS 120 120  BILITOT 0.7 1.1  PROT 6.6 6.6  ALBUMIN 2.2* 2.3*   No results for input(s): LIPASE, AMYLASE in the last 168  hours. No results for input(s): AMMONIA in the last 168 hours.  ABG    Component Value Date/Time   PHART 7.440 Feb 07, 2019 2033   PCO2ART 24.2 (L) Feb 07, 2019 2033   PO2ART 118.0 (H) Feb 07, 2019 2033   HCO3 16.4 (L) Feb 07, 2019 2033   TCO2 17 (L) Feb 07, 2019 2033   ACIDBASEDEF 7.0 (H) Feb 07, 2019 2033   O2SAT 99.0 Feb 07, 2019 2033     Coagulation Profile: Recent Labs  Lab Apr 11, 2019 1201 Apr 11, 2019 1953  INR 1.9* 1.5*    Cardiac Enzymes: Recent Labs  Lab Apr 11, 2019 1201 Apr 11, 2019 1543 Apr 11, 2019 1828 03/14/19 0351  TROPONINI 0.06* 0.22* 0.31* 0.30*    HbA1C: Hemoglobin A1C  Date/Time Value Ref Range Status  01/22/2016 6.2  Final   Hgb A1c MFr Bld  Date/Time Value Ref Range Status  07/21/2018 10:20 AM 5.8 (H) 4.8 - 5.6 % Final    Comment:    (NOTE) Pre diabetes:          5.7%-6.4% Diabetes:              >6.4% Glycemic control for   <7.0% adults with diabetes   11/26/2016 02:06 PM 5.6 <5.7 % Final    Comment:      For the purpose of screening for the presence of diabetes:   <5.7%       Consistent with the absence of diabetes 5.7-6.4 %   Consistent with increased risk for diabetes (prediabetes) >=6.5 %     Consistent with diabetes   This assay result is consistent with a decreased risk of diabetes.   Currently, no consensus exists regarding use of hemoglobin A1c for diagnosis of diabetes in children.   According to American Diabetes Association (ADA) guidelines, hemoglobin A1c <7.0% represents optimal  control in non-pregnant diabetic patients. Different metrics may apply to specific patient populations. Standards of Medical Care in Diabetes (ADA).       CBG: Recent Labs  Lab Apr 11, 2019 1144 Apr 11, 2019 1528 Apr 11, 2019 1956 Apr 11, 2019 2359 03/14/19 0352  GLUCAP 172* 144* 126* 122* 123*    Critical care time: 45 minutes    Mechele CollinJane , M.D. [facility] Pager: [phone] After hours pager: [phone]

## 2019-03-14 NOTE — Progress Notes (Signed)
Lower extremity venous has been completed.   Preliminary results in CV Proc.   Blanch Media 03/14/2019 10:59 AM

## 2019-03-14 NOTE — Progress Notes (Signed)
PCCM Discussion  After evaluating pt due to continued myoclonic activity despite ativan and versed Neurology consulted  Pt placed on EEG  Initial waveforms on EEG flat Pt did not respond to versed and ativan and continued to have jerking movements This is in the context of post cardiac arrest  Discussed with Neurology regarding prognosis  And while it is early these signs may indicate poor prognosis  Plan Loading with Keppra Will clinically follow myoclonus If it continues pt may warrant continuous sedation If despite medical intervention pt may warrant further evaluation with CTH to establish context.  I spoke with the patient's wife over the phone we discussed the details of his past medical history the details preceding his compromise and his current clinical condition. I explained to her that he was having seizure like/ jerking activity and what this could possibly mean.  We did not delve into the discussion of code status. She asked questions in an attempt to gain understanding and I provided answers.  I informed her that if there were any changes we would call her. She appreciated our care.  I relayed these details to the RN caring for the patient.  Signed Dr Newell Coral Pulmonary Critical Care Locums

## 2019-03-14 NOTE — Progress Notes (Signed)
ANTICOAGULATION CONSULT NOTE - Initial Consult  Pharmacy Consult for heparin Indication: VTE  Allergies  Allergen Reactions  . Kayexalate [Polystyrene] Other (See Comments)    Recommended to avoid by GI due to ischemic gastropathy    Patient Measurements: Height: 6\' 1"  (185.4 cm) Weight: 228 lb 6.3 oz (103.6 kg) IBW/kg (Calculated) : 79.9 Heparin Dosing Weight: 106kg  Vital Signs: Temp: 98.1 F (36.7 C) (04/22 0757) Temp Source: Oral (04/22 0757) BP: 124/75 (04/22 0726) Pulse Rate: 67 (04/22 0726)  Labs: Recent Labs    04-08-2019 1201 04/08/19 1325 08-Apr-2019 1543 04-08-19 1828 2019-04-08 1953 Apr 08, 2019 2033 03/14/19 0351  HGB 9.0* 8.5*  --   --   --  8.5*  --   HCT 30.8* 25.0*  --   --   --  25.0*  --   PLT 172  --   --   --   --   --   --   APTT  --   --  39*  --   --   --  40*  LABPROT 21.4*  --   --   --  17.7*  --   --   INR 1.9*  --   --   --  1.5*  --   --   HEPARINUNFRC  --   --   --   --   --   --  1.74*  CREATININE 1.12  --   --   --  1.26*  --  1.22  TROPONINI 0.06*  --  0.22* 0.31*  --   --  0.30*    Estimated Creatinine Clearance: 73.3 mL/min (by C-G formula based on SCr of 1.22 mg/dL).   Medical History: Past Medical History:  Diagnosis Date  . Anemia    low iron  . Arthritis    bilateral knees  . Atrial fibrillation, chronic   . Chronic diastolic CHF (congestive heart failure) (HCC) 06/15/2018  . History of subdural hematoma   . Hyperlipidemia   . Hypertension   . Morbid obesity (HCC)   . Myocardial infarction (HCC)   . Normal coronary arteries    by cardiac catheterization performed 03/14/06  . Pre-diabetes    pt denies  . S/P TAVR (transcatheter aortic valve replacement)    a. 07/25/18: Edwards Sapien 3 THV (size 26 mm, model # 9600TFX, serial # [id number]) by Dr. Laneta Simmers and Dr. Clifton James  . Sleep apnea    on CPAP  . Stroke (HCC)   . Venous insufficiency   . VF (ventricular fibrillation) (HCC) causing cardiac arrest 02/03/2019      Assessment: 68 yoM on apixaban for hx PAF discharged from hospital 4/21 and subsequently had cardiopulmonary arrest, suspected cause is valve thrombosis. Pharmacy asked to start IV heparin. Last Eliquis dose 4/21 at 0850. Heparin infusion originally ordered to start last night but held due to hematuria, will start this morning.  Pt previously therapeutic on 1400 units/hr so will begin infusion here with no bolus. Baseline aPTT 40 sec/ heparin level 1.74   Goal of Therapy:  Heparin level 0.3-0.7 units/ml aPTT 66-102 seconds Monitor platelets by anticoagulation protocol: Yes   Plan:  Start IV heparin infusion at  1400 units/hr  Check 6 hr heparin level and aPTT Daily heparin level/aPTT until levels correlate Daily CBC and monitor for bleeding  Ewing Schlein, PharmD PGY1 Pharmacy Resident 03/14/2019    9:49 AM Please check AMION for all [facility] Pharmacy numbers

## 2019-03-14 NOTE — Progress Notes (Addendum)
Patient ID: Joel Rogers, male   DOB: 1950/12/02, 68 y.o.   MRN: [id number]     PCP-Cardiologist: Quay Burow, MD   Subjective:    Patient remains intubated and unresponsive, myoclonus suppressed by Versed and Keppra.  He was not cooled initially due to significant myoclonus.   BP stable, not on pressors.  CT head with no acute findings on 4/21.   Echo (03/05/2019): EF >65%, moderate LVH, mildly decreased RV systolic function, bioprosthetic AoV s/p TAVR with mean gradient 44 mmHg and AVA 0.56 cm^2, this suggests severe bioprosthetic stenosis and is markedly different compared to 3/20 echo (mean gradient 14 mmHg) => suggest valve thrombosis versus endocarditis with obstruction (valve not well-visualized).   EEG: Severe background attenuation with myoclonus.    Objective:   Weight Range: 103.6 kg Body mass index is 30.13 kg/m.   Vital Signs:   Temp:  [98.1 F (36.7 C)-99.5 F (37.5 C)] 98.1 F (36.7 C) (04/22 0757) Pulse Rate:  [58-98] 67 (04/22 0726) Resp:  [0-28] 17 (04/22 0726) BP: (88-171)/(41-138) 124/75 (04/22 0726) SpO2:  [100 %] 100 % (04/22 0729) FiO2 (%):  [40 %-100 %] 40 % (04/22 0729) Weight:  [103.6 kg] 103.6 kg (04/22 0500) Last BM Date: (P) 03/05/2019  Weight change: Filed Weights   03/14/19 0500  Weight: 103.6 kg    Intake/Output:   Intake/Output Summary (Last 24 hours) at 03/14/2019 0941 Last data filed at 03/14/2019 0700 Gross per 24 hour  Intake 3205.52 ml  Output 704 ml  Net 2501.52 ml      Physical Exam    General:  Intubated/sedated.  HEENT: Normal Neck: Supple. JVP not elevated. Carotids 2+ bilat; no bruits. No lymphadenopathy or thyromegaly appreciated. Cor: PMI nondisplaced. Irregular rate & rhythm. 3/6 SEM RUSB, can hear S2.  Lungs: Clear anteriorly.  Abdomen: Soft, nontender, nondistended. No hepatosplenomegaly. No bruits or masses. Good bowel sounds. Extremities: No cyanosis, clubbing, rash. 1+ edema 1/2 to knees bilaterally.  Neuro:  Unresponsive   Telemetry   Atrial fibrillation with rate in 70s, 1 short runs NSVT (personally reviewed).   Labs    CBC Recent Labs    03/06/2019 1201 03/12/2019 1325 03/10/2019 2033  WBC 13.9*  --   --   NEUTROABS 8.9*  --   --   HGB 9.0* 8.5* 8.5*  HCT 30.8* 25.0* 25.0*  MCV 85.1  --   --   PLT 172  --   --    Basic Metabolic Panel Recent Labs    03/03/2019 1953 02/24/2019 2033 03/14/19 0351  NA 137 137 137  K 4.4 4.3 4.6  CL 110  --  108  CO2 17*  --  15*  GLUCOSE 150*  --  130*  BUN 17  --  17  CREATININE 1.26*  --  1.22  CALCIUM 8.4*  --  8.5*  MG 1.9  --  1.8  PHOS 4.1  --  3.9   Liver Function Tests Recent Labs    03/03/2019 1201 03/18/2019 1953  AST 38 58*  ALT 21 33  ALKPHOS 120 120  BILITOT 0.7 1.1  PROT 6.6 6.6  ALBUMIN 2.2* 2.3*   No results for input(s): LIPASE, AMYLASE in the last 72 hours. Cardiac Enzymes Recent Labs    03/03/2019 1543 03/19/2019 1828 03/14/19 0351  TROPONINI 0.22* 0.31* 0.30*    BNP: BNP (last 3 results) Recent Labs    07/21/18 1019 02/03/19 0834 02/26/2019 1201  BNP 376.1* 408.1* 2,003.2*  ProBNP (last 3 results) No results for input(s): PROBNP in the last 8760 hours.   D-Dimer No results for input(s): DDIMER in the last 72 hours. Hemoglobin A1C No results for input(s): HGBA1C in the last 72 hours. Fasting Lipid Panel Recent Labs    03/14/19 0351  TRIG 46   Thyroid Function Tests No results for input(s): TSH, T4TOTAL, T3FREE, THYROIDAB in the last 72 hours.  Invalid input(s): FREET3  Other results:   Imaging    Ct Head Wo Contrast  Result Date: 02/22/2019 CLINICAL DATA:  Altered level of consciousness EXAM: CT HEAD WITHOUT CONTRAST TECHNIQUE: Contiguous axial images were obtained from the base of the skull through the vertex without intravenous contrast. COMPARISON:  February 06, 2019 and October 26, 2016 head CT examinations; brain MRI January 19, 2019 FINDINGS: Note that there is a degree of motion  artifact. Brain: There is stable age related volume loss. There is no intracranial mass, hemorrhage, extra-axial fluid collection, or midline shift. There is mild small vessel disease in the centra semiovale bilaterally. There is decreased attenuation in the periphery the inferior right cerebellum on the right consistent with prior infarct. No acute appearing infarct is evident currently. Vascular: There is no hyperdense vessel. There is calcification in each carotid siphon region. Skull: Evidence of previous craniotomy defect on the right. No acute appearing bony defects are evident. Sinuses/Orbits: Paranasal sinuses appear grossly clear. Note that there is underlying motion artifact. Orbits appear symmetric bilaterally with limitation due to motion artifact. Other: Mastoid air cells appear clear. IMPRESSION: Less than optimal study due to motion artifact. Prior infarcts and areas of small vessel disease present. No acute appearing infarct evident. No mass or hemorrhage currently evident. Apparent resolution of previous subarachnoid hemorrhage. Previous subdural hematoma drainage on the right with evidence of previous right-sided craniotomy. There are foci of arterial vascular calcification. Electronically Signed   By: Lowella Grip III M.D.   On: 03/11/2019 12:49   Dg Chest Port 1 View  Result Date: 03/14/2019 CLINICAL DATA:  Check endotracheal tube placement EXAM: PORTABLE CHEST 1 VIEW COMPARISON:  02/28/2019 FINDINGS: Endotracheal tube and gastric catheter are seen in satisfactory position. Changes of prior TAVR are noted. Stable cardiomegaly is seen. Aortic calcifications are noted. The lungs are well aerated bilaterally. The patchy changes in the left upper and right lower lobe are again visualized but less prominent. IMPRESSION: Improved infiltrative densities in the lungs bilaterally. Tubes and lines as described. Electronically Signed   By: Inez Catalina M.D.   On: 03/14/2019 08:13   Dg Chest  Portable 1 View  Result Date: 03/02/2019 CLINICAL DATA:  Cardiorespiratory arrest. Intubation. EXAM: PORTABLE CHEST 1 VIEW COMPARISON:  02/03/2019 and earlier. FINDINGS: Endotracheal tube tip in satisfactory position projecting approximately 5 cm above the carina. External pacing pads. Prior TAVR. Cardiac silhouette markedly enlarged, unchanged. Pulmonary venous hypertension without overt edema. Minimal patchy opacities at the RIGHT lung base and in the LEFT UPPER LOBE, new since the most recent prior examination. No visible pleural effusions. IMPRESSION: 1. Endotracheal tube tip in satisfactory position projecting approximately 5 cm above the carina. 2. Stable marked cardiomegaly. Pulmonary venous hypertension without overt edema. 3. Minimal patchy atelectasis versus bronchopneumonia at the RIGHT lung base and in the LEFT upper lobe. Electronically Signed   By: Evangeline Dakin M.D.   On: 03/08/2019 12:22      Medications:     Scheduled Medications: . chlorhexidine gluconate (MEDLINE KIT)  15 mL Mouth Rinse BID  . feeding  supplement (PRO-STAT SUGAR FREE 64)  60 mL Per Tube QID  . mouth rinse  15 mL Mouth Rinse 10 times per day  . pantoprazole (PROTONIX) IV  40 mg Intravenous QHS     Infusions: . ceFEPime (MAXIPIME) IV 2 g (03/14/19 0749)  . feeding supplement (VITAL AF 1.2 CAL)    . heparin    . lactated ringers    . levETIRAcetam    . midazolam 3 mg/hr (03/14/19 0928)  . vancomycin       PRN Medications:  acetaminophen, albuterol, insulin aspart, midazolam, ondansetron (ZOFRAN) IV    Patient Profile   Joel Rogers is a 68 y.o. male with a hx of severe AS with TAVR 9/19, Vfib arrest 3/20, NSTEMI 3/23, Chronic diastolic HF, chronic Afib, GIB, CVA 2/20 who is being seen for the evaluation of cardiac arrest at the request of Dr. Valeta Harms.  Assessment/Plan   1. Cardiac arrest: PEA, was in car on way home from CIR after long hospitalization.  Unresponsive in car x 10 minutes then  had 10 minutes CPR in ER.  ROSC after 2 doses of epinephrine, no defibrillation.  ?Bioprosthetic valve thrombosis as trigger.  2. Acute hypoxemic respiratory failure: Intubated in ER. CXR with patchy infiltrates LUL and RLL that may be related to aspiration.  Per CCM.  3. CAD: NSTEMI 3/20 with 99% thrombotic stenosis in branch of ramus, medical management.  TnI only mildly elevated (maximal 0.3) this admission, suspect demand ischemia with hypotension in setting of cardiac arrest.  4. Bioprosthetic aortic valve stenosis: Echo reviewed, there has been significant elevation in mean gradient across the aortic valve to 49 mmHg since last echo in 3/20 (mean gradient 14 mmHg at that time).  I am concerned that bioprosthetic valve thrombosis versus endocarditis with obstruction could have led to PEA arrest (unfortunately, valve not well-visualized). He is afebrile.  He was on Eliquis prior to admission and it appears that he was getting all his doses.  - Blood cultures have been sent and he is on cefepime.  - Would need further workup with TEE, etc, but at this point, with minimal neurologic recovery, would hold off on further imaging. - Would start on heparin gtt (not having hematuria currently).  5. Atrial fibrillation: Chronic, rate is controlled. Has h/o CVA (while off anticoagulation for prior traumatic SDH).  - Heparin gtt starting as above.  6. Chronic diastolic CHF: May need diuresis but follow for now.  7. Neuro: Suspect anoxic encephalopathy.  Not responsive, significant myoclonus.  Neurology following, think poor prognosis.  - Family discussion per primary service.   CRITICAL CARE Performed by: Loralie Champagne  Total critical care time: 40 minutes  Critical care time was exclusive of separately billable procedures and treating other patients.  Critical care was necessary to treat or prevent imminent or life-threatening deterioration.  Critical care was time spent personally by me on the  following activities: development of treatment plan with patient and/or surrogate as well as nursing, discussions with consultants, evaluation of patient's response to treatment, examination of patient, obtaining history from patient or surrogate, ordering and performing treatments and interventions, ordering and review of laboratory studies, ordering and review of radiographic studies, pulse oximetry and re-evaluation of patient's condition.   Length of Stay: 1  Loralie Champagne, MD  03/14/2019, 9:41 AM  Advanced Heart Failure Team Pager [phone] (M-F; 7a - 4p)  Please contact [facility] for night-coverage after hours (4p -7a ) and weekends on [email or web address]

## 2019-03-14 NOTE — Progress Notes (Addendum)
Initial Nutrition Assessment  DOCUMENTATION CODES:   Obesity unspecified, will assess for malnutrition at follow-up  INTERVENTION:   Initiate TF via OG tube: - Vital AF 1.2 @ 25 ml/hr (600 ml/day) - Pro-stat 60 ml QID  Tube feeding regimen provides 1520 kcal, 165 grams of protein, and 568 ml of H2O (105% of kcal needs, 99% of protein needs).  NUTRITION DIAGNOSIS:   Inadequate oral intake related to inability to eat as evidenced by NPO status.  GOAL:   Provide needs based on ASPEN/SCCM guidelines  MONITOR:   Vent status, Labs, Weight trends, I & O's, TF tolerance  REASON FOR ASSESSMENT:   Ventilator, Consult Enteral/tube feeding initiation and management  ASSESSMENT:   68 year old male who presented to the ED on 4/21 and was pulled out of private vehicle unresponsive, pulseless. CPR initiated, 2 x epi and ROSC after 10 minutes. Pt had just been discharged from [facility] that morning. Pt found to have cardiac arrest, unclear etiology, and was intubated and admitted to the ICU. TTM not pursued. PMH of A-fib, HTN, CHF, HLD, NSTEMI, recent embolic CVA, severe AS with TAVR 9/19, V.fib cardiac arrest on 02/03/19.  Per Neurology note 4/21, pt with lack of brainstem reflexes and intermittent full-body myoclonus concerning for anoxic brain injury. CT revealed no acute appearing infarct.  RD received verbal consult for initiation and management of enteral nutrition per G. Samuella Cota, MD. Discussed with RN. OGT in place, currently to low intermittent suction.  Reviewed weight history in chart. Pt with 13.4 kg weight loss over the last 1 month. This is an 11.5% weight loss which is significant for timeframe. Difficult to determine how much of weight loss is related to fluid status given CHF diagnosis vs dry weight loss.  Noted pt diagnosed with moderate malnutrition on 02/07/19 by student dietitian. At that time, NFPE revealed mild fat depletion and moderate muscle depletion. Unable to confirm that  this diagnosis persists without completing NFPE.  Reviewed RN edema assessment. Pt with mild pitting generalized edema and mild pitting edema to RUE and LLE. Pt also with moderate pitting edema to LUE and RLE.  ADDENDUM [phone]): Reviewed history and RD note from previous admission. Pt with history of gastric bypass surgery 2009, with multiple complications. Will monitor for tolerance of TF.  Patient is currently intubated on ventilator support. MV: 12.5 L/min Temp (24hrs), Avg:98.7 F (37.1 C), Min:98.1 F (36.7 C), Max:99.5 F (37.5 C) BP: 115/62 MAP: 79  Propofol: none LR: 125 ml/hr Versed: 3 ml/hr  Medications reviewed and include: Protonix, IV abx, IV Keppra  Labs reviewed: CO2 15 (L) CBG's: 123, 122, 126, 144 x 24 hours  UOP: 604 ml x 24 hours OG tube output: 100 ml x 24 hours I/O's: +2.5 L since admit  NUTRITION - FOCUSED PHYSICAL EXAM:  Unable to complete at this time. RD working remotely.  Diet Order:   Diet Order            Diet NPO time specified  Diet effective now              EDUCATION NEEDS:   Not appropriate for education at this time  Skin:  Skin Assessment: Skin Integrity Issues: Stage II: sacrum (from previous admission)  Last BM:  2019-04-10  Height:   Ht Readings from Last 1 Encounters:  2019-04-10 6\' 1"  (1.854 m)    Weight:   Wt Readings from Last 1 Encounters:  03/14/19 103.6 kg    Ideal Body Weight:  83.6 kg  BMI:  Body mass index is 30.13 kg/m.  Estimated Nutritional Needs:   Kcal:  1140-1450  Protein:  167-180 grams  Fluid:  1.5 L or per MD    Earma ReadingKate Jablonski Shonna Deiter, MS, RD, LDN Inpatient Clinical Dietitian Pager: [phone] Weekend/After Hours: [phone]

## 2019-03-14 NOTE — Procedures (Signed)
ELECTROENCEPHALOGRAM REPORT   Patient: Joel Rogers       Room #: [facility] EEG No. ID: [id number] Age: 69 y.o.        Sex: male Referring Physician: Everardo All Report Date:  03/14/2019        Interpreting Physician: Thana Farr  History: Joel Rogers is an 68 y.o. male s/p arrest  Medications:  Cefepime, Keppra, Protonix, Vancomycin  Conditions of Recording:  This is a 21 channel routine scalp EEG performed with bipolar and monopolar montages arranged in accordance to the international 10/20 system of electrode placement. One channel was dedicated to EKG recording.  The patient is in the intubated and unresponsive state.  Description:  The background activity is fully attenuated.  The only background activity appreciated is with clinical myoclonus episodes.  During the clinical periods of myoclonus there are noted bursts of generalized, high voltage activity that appear to be a combination of polyspike activity and movement artifact.  This discontinuous activity is persistent throughout the recording.  The patient is stimulated on multiple occasions with no activation of the background noted.   Hyperventilation and intermittent photic stimulation were not performed.  IMPRESSION: This is an abnormal electroencephalogram due to severe background attenuation and artifact/polyspike activity associated with clinical myoclonus   Thana Farr, MD Neurology [phone] 03/14/2019, 8:44 AM

## 2019-03-14 NOTE — Progress Notes (Signed)
ANTICOAGULATION CONSULT NOTE   Pharmacy Consult for Heparin Indication: VTE  Allergies  Allergen Reactions  . Kayexalate [Polystyrene] Other (See Comments)    Recommended to avoid by GI due to ischemic gastropathy    Patient Measurements: Height: 6\' 1"  (185.4 cm) Weight: 228 lb 6.3 oz (103.6 kg) IBW/kg (Calculated) : 79.9 Heparin Dosing Weight: 106kg  Vital Signs: Temp: 99.5 F (37.5 C) (04/22 2200) Temp Source: Core (04/22 1600) BP: 138/88 (04/22 2200) Pulse Rate: 93 (04/22 2200)  Labs: Recent Labs    07-13-19 1201 07-13-19 1325  07-13-19 1543 07-13-19 1828 07-13-19 1953 07-13-19 2033 03/14/19 0351 03/14/19 1051 03/14/19 1555 03/14/19 2203  HGB 9.0* 8.5*  --   --   --   --  8.5*  --  8.9*  --   --   HCT 30.8* 25.0*  --   --   --   --  25.0*  --  27.2*  --   --   PLT 172  --   --   --   --   --   --   --  191  --   --   APTT  --   --    < > 39*  --   --   --  40*  --  99* 84*  LABPROT 21.4*  --   --   --   --  17.7*  --   --   --   --   --   INR 1.9*  --   --   --   --  1.5*  --   --   --   --   --   HEPARINUNFRC  --   --   --   --   --   --   --  1.74*  --  1.52*  --   CREATININE 1.12  --   --   --   --  1.26*  --  1.22  --   --   --   TROPONINI 0.06*  --   --  0.22* 0.31*  --   --  0.30*  --   --   --    < > = values in this interval not displayed.    Estimated Creatinine Clearance: 73.3 mL/min (by C-G formula based on SCr of 1.22 mg/dL).   Medical History: Past Medical History:  Diagnosis Date  . Anemia    low iron  . Arthritis    bilateral knees  . Atrial fibrillation, chronic   . Chronic diastolic CHF (congestive heart failure) (HCC) 06/15/2018  . History of subdural hematoma   . Hyperlipidemia   . Hypertension   . Morbid obesity (HCC)   . Myocardial infarction (HCC)   . Normal coronary arteries    by cardiac catheterization performed 03/14/06  . Pre-diabetes    pt denies  . S/P TAVR (transcatheter aortic valve replacement)    a. 07/25/18:  Edwards Sapien 3 THV (size 26 mm, model # 9600TFX, serial # [id number]) by Dr. Laneta SimmersBartle and Dr. Clifton JamesMcAlhany  . Sleep apnea    on CPAP  . Stroke (HCC)   . Venous insufficiency   . VF (ventricular fibrillation) (HCC) causing cardiac arrest 02/03/2019    Assessment: 68 yoM on apixaban for hx PAF discharged from hospital 4/21 and subsequently had cardiopulmonary arrest, suspected cause is valve thrombosis. Pharmacy asked to start IV heparin. Last Eliquis dose 4/21 at 0850. Heparin infusion originally ordered to start last night but  held due to hematuria, started 4/22 AM.  APTT therapeutic at 99, heparin level elevated as expected. CBC stable. No active bleed issues documented.  4/22 PM update: aPTT now therapeutic x 2  Goal of Therapy:  Heparin level 0.3-0.7 units/ml aPTT 66-102 seconds Monitor platelets by anticoagulation protocol: Yes   Plan:  Continue IV heparin infusion at 1400 units/hr  Daily heparin level/aPTT until levels correlate Daily CBC and monitor for s/sx bleeding  Abran Duke, PharmD, BCPS Clinical Pharmacist Phone: [phone]

## 2019-03-14 NOTE — Progress Notes (Addendum)
MRI brain reveals severe anoxic brain injury, based on this very little likelihood of any meaningful neurological recovery - this was discussed with Dr. Wilford Corner.  I updated Ms. Joel Rogers, patient's wife, discussing the poor prognosis. We discussed how hers and [name]'s goals would be after this hospitalization - to be home, independent, interacting with his family. Based on those goals and the current prognosis she agrees that performing CPR/shock/ACLS meds would not alter the patient's course  She states that it seems that everything that could be done has already been done.  On discussing next steps, I broached the subject of transition to comfort care and terminal extubation; she wonders what time that would happen. I advised her that patient can have up to 4 people at bedside during the transition to comfort care; she will contact her family and call us back with her decision and timing.   I updated her son Joel Rogers [phone]) at her request; he had also last been updated last night by our service.   They had no further questions at the time.  Patient will be made DNR. Will wait for call from family about decision on terminal extubation and transition to comfort care.  Nyra Market, MD PGY3 Pager [phone]

## 2019-03-14 NOTE — Progress Notes (Signed)
ANTICOAGULATION CONSULT NOTE - Initial Consult  Pharmacy Consult for heparin Indication: VTE  Allergies  Allergen Reactions  . Kayexalate [Polystyrene] Other (See Comments)    Recommended to avoid by GI due to ischemic gastropathy    Patient Measurements: Height: 6\' 1"  (185.4 cm) Weight: 228 lb 6.3 oz (103.6 kg) IBW/kg (Calculated) : 79.9 Heparin Dosing Weight: 106kg  Vital Signs: Temp: 99.3 F (37.4 C) (04/22 1600) Temp Source: Core (04/22 1600) BP: 141/81 (04/22 1600) Pulse Rate: 77 (04/22 1600)  Labs: Recent Labs    04-12-19 1201 Apr 12, 2019 1325 12-Apr-2019 1543 2019-04-12 1828 04/12/19 1953 Apr 12, 2019 2033 03/14/19 0351 03/14/19 1051 03/14/19 1555  HGB 9.0* 8.5*  --   --   --  8.5*  --  8.9*  --   HCT 30.8* 25.0*  --   --   --  25.0*  --  27.2*  --   PLT 172  --   --   --   --   --   --  191  --   APTT  --   --  39*  --   --   --  40*  --  99*  LABPROT 21.4*  --   --   --  17.7*  --   --   --   --   INR 1.9*  --   --   --  1.5*  --   --   --   --   HEPARINUNFRC  --   --   --   --   --   --  1.74*  --  1.52*  CREATININE 1.12  --   --   --  1.26*  --  1.22  --   --   TROPONINI 0.06*  --  0.22* 0.31*  --   --  0.30*  --   --     Estimated Creatinine Clearance: 73.3 mL/min (by C-G formula based on SCr of 1.22 mg/dL).   Medical History: Past Medical History:  Diagnosis Date  . Anemia    low iron  . Arthritis    bilateral knees  . Atrial fibrillation, chronic   . Chronic diastolic CHF (congestive heart failure) (HCC) 06/15/2018  . History of subdural hematoma   . Hyperlipidemia   . Hypertension   . Morbid obesity (HCC)   . Myocardial infarction (HCC)   . Normal coronary arteries    by cardiac catheterization performed 03/14/06  . Pre-diabetes    pt denies  . S/P TAVR (transcatheter aortic valve replacement)    a. 07/25/18: Edwards Sapien 3 THV (size 26 mm, model # 9600TFX, serial # [id number]) by Dr. Laneta Simmers and Dr. Clifton James  . Sleep apnea    on CPAP  . Stroke  (HCC)   . Venous insufficiency   . VF (ventricular fibrillation) (HCC) causing cardiac arrest 02/03/2019    Assessment: 68 yoM on apixaban for hx PAF discharged from hospital 4/21 and subsequently had cardiopulmonary arrest, suspected cause is valve thrombosis. Pharmacy asked to start IV heparin. Last Eliquis dose 4/21 at 0850. Heparin infusion originally ordered to start last night but held due to hematuria, started 4/22 AM.  APTT therapeutic at 99, heparin level elevated as expected. CBC stable. No active bleed issues documented.  Goal of Therapy:  Heparin level 0.3-0.7 units/ml aPTT 66-102 seconds Monitor platelets by anticoagulation protocol: Yes   Plan:  Continue IV heparin infusion at 1400 units/hr  Confirmatory aPTT in 6hrs Daily heparin level/aPTT until levels correlate Daily  CBC and monitor for s/sx bleeding  Babs BertinHaley Andreana Klingerman, PharmD, BCPS Please check AMION for all [facility] contact numbers Clinical Pharmacist 03/14/2019 5:25 PM

## 2019-03-14 NOTE — Progress Notes (Signed)
I responded to a request to provide spiritual care for the patient. I visited the patient's room and provided spiritual support by saying a prayer for the patient's wellbeing, the medical staff attending to his care, and for his family and friends.    03/14/19 1600  Clinical Encounter Type  Visited With Patient  Visit Type Spiritual support  Referral From Nurse  Consult/Referral To Chaplain  Spiritual Encounters  Spiritual Needs Prayer    Chaplain Dr Melvyn Novas

## 2019-03-14 NOTE — Progress Notes (Addendum)
NAME:  Joel Rogers, MRN:  [id number], DOB:  06-24-1951, LOS: 1 ADMISSION DATE:  Apr 11, 2019, CONSULTATION DATE:  04/11/19 REFERRING MD: Criss Alvine, EM  , CHIEF COMPLAINT:  Cardiac arrest   Brief History   68 yo M discharged today from rehab, became unresponsive while being driven home for about 10 minutes until arriving to ED. At ED found to be pulseless, unresponsive. ACLS initiated, ROSC after approx 10 minutes. Now intubated. CT Head no acute process.   History of present illness   68 yo M discharged 4/21 from Rehab. Prior presentation was 3/14 s/p VFib arrest, with subsequent hospital stay and rehab course for treatment of Staph bacteremia/cellulitis and GIB. In ED patient received 2x Epi and chest compressions for approximately 10 minutes. He was intubated in ED. CT Head reveals no acute process. PCCM asked to admit to ICU.  Past Medical History   Anemia     low iron  . Arthritis    bilateral knees  . Atrial fibrillation, chronic   . Chronic diastolic CHF (congestive heart failure) (HCC) 06/15/2018  . History of subdural hematoma   . Hyperlipidemia   . Hypertension   . Morbid obesity (HCC)   . Normal coronary arteries    by cardiac catheterization performed 03/14/06  . Pre-diabetes    pt denies  . S/P TAVR (transcatheter aortic valve replacement)    a. 07/25/18: Edwards Sapien 3 THV (size 26 mm, model # 9600TFX, serial # [id number]) by Dr. Laneta Simmers and Dr. Clifton James  . Sleep apnea    on CPAP  . Venous insufficiency      Significant Hospital Events   4/21> 10 minutes ACLS with ROSC. Intubated. CT Head no acute process. Admit to ICU   Consults:  PCCM   Procedures:  ETT 4/21>   Significant Diagnostic Tests:  CT Head 4/21> no acute abnormality. Prior SDH drainage on right.   Micro Data:  4/21 BCx> 4/21 Tracheal aspirate >   Antimicrobials:  4/21 vanc/cefepime>   Interim history/subjective:  Started having myoclonic jerks last night; on versed drip.  Objective   Blood pressure 124/75, pulse 67, temperature 98.1 F (36.7 C), temperature source Oral, resp. rate 17, height 6\' 1"  (1.854 m), weight 103.6 kg, SpO2 100 %.    Vent Mode: PSV;CPAP FiO2 (%):  [40 %-100 %] 40 % Set Rate:  [16 bmp] 16 bmp Vt Set:  [630 mL] 630 mL PEEP:  [5 cmH20] 5 cmH20 Pressure Support:  [15 cmH20] 15 cmH20 Plateau Pressure:  [19 cmH20-22 cmH20] 22 cmH20   Intake/Output Summary (Last 24 hours) at 03/14/2019 0801 Last data filed at 03/14/2019 0700 Gross per 24 hour  Intake 3205.52 ml  Output 704 ml  Net 2501.52 ml   Filed Weights   03/14/19 0500  Weight: 103.6 kg    Examination: General: Chronically ill appearing M, intubated HENT: NCAT.  ETT secure.  Lungs: bilateral scattered rhonchi Cardiovascular: Irregular rhythm, regular rate, pulses intact Abdomen: Soft, NDNT, decreased bowel sounds  Extremities: 2+ pitting edema RLE, 1+ pitting edema LLE.  Neuro: sedated, does not respond to pain. Pupils fixed 64mm bilaterally  Skin: RLE induration without increased warmth.   Resolved Hospital Problem list     Assessment & Plan:   S/p Cardiac Arrest Unresponsive for at least 10 minutes prior to arrival in ED, then received 10 minutes CPR, 2x epi with ROSC. He has a h/o of a VFib arrest in in March in setting of an NSTEMI. Trops peaked at 0.3; echo  4/21 without regional wall motion abnormalities, EF >65%. TTM was not pursued. Cardiology on board, appreciate recommendations. Etiology likely cardiogenic in nature based on his history; he has been getting Eliquis at CIR; in setting of mild leukocytosis and recent history of bacteremia, sepsis is also a possible etiology. -f/u cards recs -goal MAPs > 65, not requiring pressors at this time -cycle troponins  -continue heparin drip  Acute respiratory failure requiring intubation in setting of cardiac arrest This morning he is tolerating PSV weaning well; not ready for extubation barring mental status. -Continue  mechanical vent support -AM WUA/SBT -pulm hygiene -Adjust PEEP/FiO2 for goal SpO2 >92 -PRN albuterol  -Follow up Tracheal aspirate culture  Encephalopathy in setting of cardiac arrest: Unresponsive post-ROSC, then started having diffuse myoclonic jerks. Neurology has been consulted; he has been loaded with Keppra; EEG completed with results pending. Based on prelim read of EEG, this likely indicates a poor prognosis as there was lack of background activity. -continue versed drip, prns; wean if able -start propofol drip if needed -continue keppra 1g BID -f/u EEG read -appreciate neuro recs  NAGMA in setting of cardiac arrest and myoclonic jerks: AKI: On LR maintenance fluids. Lactic acid elevated yesterday to 3.4. -continue LR for now  Afib -continue tele -hold eliquis, on heparin drip  HTN -hold antihypertensives at this time  S/P TAVR CAD Lateral Ramus lesion stenosis Per Cards; currently on heparin drip  Pre-diabetes -SSI  History of recent embolic CVA, hypoxic ischemic encephalopathy CT head 4/21 negative for acute process -Follow neuro exam  -RASS goal 0, -1   History of pan sensitive Strep bacteremia/cellulitis, s/p abx treatment MRSA screen negative -Follow up BCx, tracheal aspirate -Vanc/cef initiated in ED, narrow as able   Best practice:  Diet: NPO  Pain/Anxiety/Delirium protocol (if indicated): versed, prns VAP protocol (if indicated): yes  DVT prophylaxis: heparin GI prophylaxis: Protonix  Glucose control: SSI  Mobility: bedrest  Code Status: full  Family Communication: wife updated last night  Disposition: ICU   Labs   CBC: Recent Labs  Lab 03/09/19 1138 02/22/2019 1201 03/20/2019 1325 03/01/2019 2033  WBC 10.9* 13.9*  --   --   NEUTROABS  --  8.9*  --   --   HGB 8.9* 9.0* 8.5* 8.5*  HCT 28.3* 30.8* 25.0* 25.0*  MCV 77.3* 85.1  --   --   PLT 234 172  --   --     Basic Metabolic Panel: Recent Labs  Lab 03/09/19 1138 03/22/2019 0555  03/14/2019 1201 02/24/2019 1325 02/21/2019 1543 03/20/2019 1953 02/22/2019 2033 03/14/19 0351  NA 136 136 136 137  --  137 137 137  K 4.8 4.3 5.0 4.2  --  4.4 4.3 4.6  CL 110 108 108  --   --  110  --  108  CO2 17* 20* 10*  --   --  17*  --  15*  GLUCOSE 105* 95 198*  --   --  150*  --  130*  BUN 13 12 12   --   --  17  --  17  CREATININE 0.83 0.97 1.12  --   --  1.26*  --  1.22  CALCIUM 8.7* 8.7* 8.9  --   --  8.4*  --  8.5*  MG  --   --   --   --  1.9 1.9  --  1.8  PHOS  --   --   --   --  4.2 4.1  --  3.9   GFR: Estimated Creatinine Clearance: 73.3 mL/min (by C-G formula based on SCr of 1.22 mg/dL). Recent Labs  Lab 03/09/19 1138 03-20-19 1201 2019-03-20 1543 03/20/2019 1828  PROCALCITON  --   --  0.60  --   WBC 10.9* 13.9*  --   --   LATICACIDVEN  --   --  3.0* 3.4*    Liver Function Tests: Recent Labs  Lab 2019-03-20 1201 2019/03/20 1953  AST 38 58*  ALT 21 33  ALKPHOS 120 120  BILITOT 0.7 1.1  PROT 6.6 6.6  ALBUMIN 2.2* 2.3*   No results for input(s): LIPASE, AMYLASE in the last 168 hours. No results for input(s): AMMONIA in the last 168 hours.  ABG    Component Value Date/Time   PHART 7.440 03/20/2019 2033   PCO2ART 24.2 (L) March 20, 2019 2033   PO2ART 118.0 (H) 20-Mar-2019 2033   HCO3 16.4 (L) 20-Mar-2019 2033   TCO2 17 (L) 2019/03/20 2033   ACIDBASEDEF 7.0 (H) 03-20-19 2033   O2SAT 99.0 2019-03-20 2033     Coagulation Profile: Recent Labs  Lab Mar 20, 2019 1201 2019/03/20 1953  INR 1.9* 1.5*    Cardiac Enzymes: Recent Labs  Lab 2019/03/20 1201 03/20/19 1543 03/20/19 1828 03/14/19 0351  TROPONINI 0.06* 0.22* 0.31* 0.30*    HbA1C: Hemoglobin A1C  Date/Time Value Ref Range Status  01/22/2016 6.2  Final   Hgb A1c MFr Bld  Date/Time Value Ref Range Status  07/21/2018 10:20 AM 5.8 (H) 4.8 - 5.6 % Final    Comment:    (NOTE) Pre diabetes:          5.7%-6.4% Diabetes:              >6.4% Glycemic control for   <7.0% adults with diabetes   11/26/2016 02:06  PM 5.6 <5.7 % Final    Comment:      For the purpose of screening for the presence of diabetes:   <5.7%       Consistent with the absence of diabetes 5.7-6.4 %   Consistent with increased risk for diabetes (prediabetes) >=6.5 %     Consistent with diabetes   This assay result is consistent with a decreased risk of diabetes.   Currently, no consensus exists regarding use of hemoglobin A1c for diagnosis of diabetes in children.   According to American Diabetes Association (ADA) guidelines, hemoglobin A1c <7.0% represents optimal control in non-pregnant diabetic patients. Different metrics may apply to specific patient populations. Standards of Medical Care in Diabetes (ADA).       CBG: Recent Labs  Lab 2019-03-20 1144 03-20-19 1528 03-20-2019 1956 2019-03-20 2359 03/14/19 0352  GLUCAP 172* 144* 126* 122* 123*    Review of Systems:   Unable to obtain due to sedation and unresponsiveness  Past Medical History  He,  has a past medical history of Anemia, Arthritis, Atrial fibrillation, chronic, Chronic diastolic CHF (congestive heart failure) (HCC) (06/15/2018), History of subdural hematoma, Hyperlipidemia, Hypertension, Morbid obesity (HCC), Myocardial infarction (HCC), Normal coronary arteries, Pre-diabetes, S/P TAVR (transcatheter aortic valve replacement), Sleep apnea, Stroke (HCC), Venous insufficiency, and VF (ventricular fibrillation) (HCC) causing cardiac arrest (02/03/2019).   Surgical History    Past Surgical History:  Procedure Laterality Date  . BIOPSY  01/26/2019   Procedure: BIOPSY;  Surgeon: Bernette Redbird, MD;  Location: [name] ENDOSCOPY;  Service: Endoscopy;;  . CRANIOTOMY Right 08/27/2016   Procedure: CRANIOTOMY HEMATOMA EVACUATION SUBDURAL;  Surgeon: Maeola Harman, MD;  Location: [facility] OR;  Service: Neurosurgery;  Laterality: Right;  .  ESOPHAGOGASTRODUODENOSCOPY N/A 01/21/2019   Procedure: ESOPHAGOGASTRODUODENOSCOPY (EGD);  Surgeon: Vida Rigger, MD;  Location: [facility] ENDOSCOPY;   Service: Endoscopy;  Laterality: N/A;  . ESOPHAGOGASTRODUODENOSCOPY (EGD) WITH PROPOFOL N/A 01/22/2019   Procedure: ESOPHAGOGASTRODUODENOSCOPY (EGD) WITH PROPOFOL;  Surgeon: Bernette Redbird, MD;  Location: [facility] ENDOSCOPY;  Service: Endoscopy;  Laterality: N/A;  . ESOPHAGOGASTRODUODENOSCOPY (EGD) WITH PROPOFOL N/A 01/26/2019   Procedure: ESOPHAGOGASTRODUODENOSCOPY (EGD) WITH PROPOFOL;  Surgeon: Bernette Redbird, MD;  Location: [facility] ENDOSCOPY;  Service: Endoscopy;  Laterality: N/A;  . GASTRIC BYPASS  09/2008  . JOINT REPLACEMENT  08/31/2006   right hip  . LEFT HEART CATH AND CORONARY ANGIOGRAPHY N/A 02/05/2019   Procedure: LEFT HEART CATH AND CORONARY ANGIOGRAPHY;  Surgeon: Corky Crafts, MD;  Location: [facility] INVASIVE CV LAB;  Service: Cardiovascular;  Laterality: N/A;  . MULTIPLE EXTRACTIONS WITH ALVEOLOPLASTY N/A 07/13/2018   Procedure: Extraction of tooth #'s 3,8,10, 23-26, 30and 32 with alveoloplasty and gross debridement of remaining teeth;  Surgeon: Charlynne Pander, DDS;  Location: [facility] OR;  Service: Oral Surgery;  Laterality: N/A;  . RIGHT HEART CATH N/A 07/06/2018   Procedure: RIGHT HEART CATH;  Surgeon: Kathleene Hazel, MD;  Location: [facility] INVASIVE CV LAB;  Service: Cardiovascular;  Laterality: N/A;  . RIGHT/LEFT HEART CATH AND CORONARY ANGIOGRAPHY N/A 07/10/2018   Procedure: RIGHT/LEFT HEART CATH AND CORONARY ANGIOGRAPHY;  Surgeon: Dolores Patty, MD;  Location: [facility] INVASIVE CV LAB;  Service: Cardiovascular;  Laterality: N/A;  . TEE WITHOUT CARDIOVERSION N/A 07/25/2018   Procedure: TRANSESOPHAGEAL ECHOCARDIOGRAM (TEE);  Surgeon: Kathleene Hazel, MD;  Location: [facility] OR;  Service: Open Heart Surgery;  Laterality: N/A;  . TEE WITHOUT CARDIOVERSION N/A 01/18/2019   Procedure: TRANSESOPHAGEAL ECHOCARDIOGRAM (TEE) WITH PROPOFOL;  Surgeon: Jonelle Sidle, MD;  Location: [facility] ORS;  Service: Cardiovascular;  Laterality: N/A;  . TOTAL HIP ARTHROPLASTY     right hip  . TRANSCATHETER AORTIC  VALVE REPLACEMENT, TRANSFEMORAL  07/25/2018  . TRANSCATHETER AORTIC VALVE REPLACEMENT, TRANSFEMORAL N/A 07/25/2018   Procedure: TRANSCATHETER AORTIC VALVE REPLACEMENT, TRANSFEMORAL;  Surgeon: Kathleene Hazel, MD;  Location: [facility] OR;  Service: Open Heart Surgery;  Laterality: N/A;     Social History   reports that he has quit smoking. He has never used smokeless tobacco. He reports that he does not drink alcohol or use drugs.   Family History   His family history includes Alzheimer's disease in his maternal grandfather and mother; Breast cancer in his sister; Cancer in his brother and father; Hypertension in his father.   Allergies Allergies  Allergen Reactions  . Kayexalate [Polystyrene] Other (See Comments)    Recommended to avoid by GI due to ischemic gastropathy     Home Medications  Prior to Admission medications   Medication Sig Start Date End Date Taking? Authorizing Provider  acetaminophen (TYLENOL) 650 MG CR tablet Take 650 mg by mouth 2 (two) times daily as needed for pain.    [provider]  amLODipine (NORVASC) 5 MG tablet Take 1 tablet (5 mg total) by mouth daily. 04/11/2019   Angiulli, Mcarthur Rossetti, PA-C  apixaban (ELIQUIS) 5 MG TABS tablet Take 1 tablet (5 mg total) by mouth 2 (two) times daily. 04-11-19   Angiulli, Mcarthur Rossetti, PA-C  atorvastatin (LIPITOR) 80 MG tablet TAKE 1 TABLET BY MOUTH ONCE DAILY WITH&nbsp;&nbsp;BREAKFAST 04/11/2019   Angiulli, Mcarthur Rossetti, PA-C  colchicine 0.6 MG tablet Take 1 tablet (0.6 mg total) by mouth as needed (Gout flare up.). 02/12/19   Barrett, Joline Salt, PA-C  furosemide (LASIX) 40 MG tablet Take 1 tablet (40 mg total) by mouth daily. Apr 09, 2019   Angiulli, Mcarthur Rossetti, PA-C  metoprolol tartrate (LOPRESSOR) 50 MG tablet Take 1 tablet (50 mg total) by mouth 2 (two) times daily. 04/09/2019   Angiulli, Mcarthur Rossetti, PA-C  nitroGLYCERIN (NITROSTAT) 0.4 MG SL tablet Place 1 tablet (0.4 mg total) under the tongue every 5 (five) minutes x 3 doses as needed for  chest pain. 02/12/19   Barrett, Joline Salt, PA-C  pantoprazole (PROTONIX) 40 MG tablet Take 1 tablet (40 mg total) by mouth 2 (two) times daily. 09-Apr-2019   Angiulli, Mcarthur Rossetti, PA-C  potassium chloride SA (K-DUR) 20 MEQ tablet Take 2 tablets (40 mEq total) by mouth daily. 04/09/2019   Angiulli, Mcarthur Rossetti, PA-C  traMADol-acetaminophen (ULTRACET) 37.5-325 MG tablet Take 1-2 tablets by mouth every 6 (six) hours as needed for moderate pain or severe pain. 04-09-19   Angiulli, Mcarthur Rossetti, PA-C    Nyra Market, MD PGY3 Pager [phone]

## 2019-03-15 DIAGNOSIS — G931 Anoxic brain damage, not elsewhere classified: Secondary | ICD-10-CM

## 2019-03-15 LAB — CBC
HCT: 27.7 % — ABNORMAL LOW (ref 39.0–52.0)
Hemoglobin: 9.1 g/dL — ABNORMAL LOW (ref 13.0–17.0)
MCH: 24.7 pg — ABNORMAL LOW (ref 26.0–34.0)
MCHC: 32.9 g/dL (ref 30.0–36.0)
MCV: 75.3 fL — ABNORMAL LOW (ref 80.0–100.0)
Platelets: 214 10*3/uL (ref 150–400)
RBC: 3.68 MIL/uL — ABNORMAL LOW (ref 4.22–5.81)
RDW: 19.2 % — ABNORMAL HIGH (ref 11.5–15.5)
WBC: 18.8 10*3/uL — ABNORMAL HIGH (ref 4.0–10.5)
nRBC: 0 % (ref 0.0–0.2)

## 2019-03-15 LAB — RENAL FUNCTION PANEL
Albumin: 2.2 g/dL — ABNORMAL LOW (ref 3.5–5.0)
Anion gap: 10 (ref 5–15)
BUN: 25 mg/dL — ABNORMAL HIGH (ref 8–23)
CO2: 17 mmol/L — ABNORMAL LOW (ref 22–32)
Calcium: 8.5 mg/dL — ABNORMAL LOW (ref 8.9–10.3)
Chloride: 111 mmol/L (ref 98–111)
Creatinine, Ser: 1.1 mg/dL (ref 0.61–1.24)
GFR calc Af Amer: >60 mL/min (ref >60)
GFR calc non Af Amer: >60 mL/min (ref >60)
Glucose, Bld: 127 mg/dL — ABNORMAL HIGH (ref 70–99)
Phosphorus: 3.3 mg/dL (ref 2.5–4.6)
Potassium: 3.7 mmol/L (ref 3.5–5.1)
Sodium: 138 mmol/L (ref 135–145)

## 2019-03-15 LAB — MAGNESIUM: Magnesium: 2.1 mg/dL (ref 1.7–2.4)

## 2019-03-15 LAB — HEPARIN LEVEL (UNFRACTIONATED): Heparin Unfractionated: 1.04 IU/mL — ABNORMAL HIGH (ref 0.30–0.70)

## 2019-03-15 LAB — GLUCOSE, CAPILLARY: Glucose-Capillary: 82 mg/dL (ref 70–99)

## 2019-03-15 LAB — APTT: aPTT: 95 seconds — ABNORMAL HIGH (ref 24–36)

## 2019-03-15 MED ORDER — SODIUM CHLORIDE 0.9 % IV SOLN
2.0000 g | Freq: Three times a day (TID) | INTRAVENOUS | Status: DC
  Administered 2019-03-15 – 2019-03-16 (×3): 2 g via INTRAVENOUS
  Filled 2019-03-15 (×5): qty 2

## 2019-03-15 MED ORDER — SODIUM CHLORIDE 0.9 % IV SOLN
INTRAVENOUS | Status: DC
  Administered 2019-03-15: 08:00:00 via INTRAVENOUS
  Administered 2019-03-16: 22:00:00 50 mL/h via INTRAVENOUS
  Administered 2019-03-16: 03:00:00 via INTRAVENOUS

## 2019-03-15 NOTE — Progress Notes (Addendum)
Spoke with Joel Rogers, patient's wife again this morning. She wonders whether transition to comfort care can occur on Sunday 4/26 as that is the earliest her other son is able to make it in. I explained that though he is hemodynamically stable at this time, that may change and he could pass prior to Sunday. I offered that her son can join via video conference, though not ideal. She declined.   I asked that she update her family on our conversation today and that she can contact us if the situation changes. I will also update her if any changes occur with Joel Rogers.  On calling back and discussing the situation further, she agrees to visiting and transitioning to comfort care tomorrow morning at 10am.   Nyra Market, MD PGY3 Pager [phone]

## 2019-03-15 NOTE — Progress Notes (Signed)
ANTICOAGULATION CONSULT NOTE   Pharmacy Consult for Heparin Indication: VTE  Allergies  Allergen Reactions  . Kayexalate [Polystyrene] Other (See Comments)    Recommended to avoid by GI due to ischemic gastropathy    Patient Measurements: Height: 6\' 1"  (185.4 cm) Weight: 238 lb 12.1 oz (108.3 kg) IBW/kg (Calculated) : 79.9 Heparin Dosing Weight: 106kg  Vital Signs: Temp: 99.3 F (37.4 C) (04/23 0800) Temp Source: Bladder (04/23 0000) BP: 133/88 (04/23 0800) Pulse Rate: 95 (04/23 0800)  Labs: Recent Labs    2018-12-16 1201  2018-12-16 1543 2018-12-16 1828 2018-12-16 1953 2018-12-16 2033 03/14/19 0351 03/14/19 1051 03/14/19 1555 03/14/19 2203 03/15/19 0355  HGB 9.0*   < >  --   --   --  8.5*  --  8.9*  --   --  9.1*  HCT 30.8*   < >  --   --   --  25.0*  --  27.2*  --   --  27.7*  PLT 172  --   --   --   --   --   --  191  --   --  214  APTT  --    < > 39*  --   --   --  40*  --  99* 84* 95*  LABPROT 21.4*  --   --   --  17.7*  --   --   --   --   --   --   INR 1.9*  --   --   --  1.5*  --   --   --   --   --   --   HEPARINUNFRC  --   --   --   --   --   --  1.74*  --  1.52*  --  1.04*  CREATININE 1.12  --   --   --  1.26*  --  1.22  --   --   --  1.10  TROPONINI 0.06*  --  0.22* 0.31*  --   --  0.30*  --   --   --   --    < > = values in this interval not displayed.    Estimated Creatinine Clearance: 83 mL/min (by C-G formula based on SCr of 1.1 mg/dL).   Medical History: Past Medical History:  Diagnosis Date  . Anemia    low iron  . Arthritis    bilateral knees  . Atrial fibrillation, chronic   . Chronic diastolic CHF (congestive heart failure) (HCC) 06/15/2018  . History of subdural hematoma   . Hyperlipidemia   . Hypertension   . Morbid obesity (HCC)   . Myocardial infarction (HCC)   . Normal coronary arteries    by cardiac catheterization performed 03/14/06  . Pre-diabetes    pt denies  . S/P TAVR (transcatheter aortic valve replacement)    a. 07/25/18:  Edwards Sapien 3 THV (size 26 mm, model # 9600TFX, serial # [id number]) by Dr. Laneta SimmersBartle and Dr. Clifton JamesMcAlhany  . Sleep apnea    on CPAP  . Stroke (HCC)   . Venous insufficiency   . VF (ventricular fibrillation) (HCC) causing cardiac arrest 02/03/2019    Assessment: 68 yoM on apixaban for hx PAF discharged from hospital 4/21 and subsequently had cardiopulmonary arrest, suspected cause is valve thrombosis. Pharmacy asked to start IV heparin. Last Eliquis dose 4/21 at 0850. Heparin infusion originally ordered to start last night but held due to hematuria, started 4/22  AM.  APTT therapeutic at 95, heparin level continues to be elevated as expected. CBC stable. No active bleed issues documented.  Goal of Therapy:  Heparin level 0.3-0.7 units/ml aPTT 66-102 seconds Monitor platelets by anticoagulation protocol: Yes   Plan:  Continue IV heparin infusion at 1400 units/hr  Daily heparin level/aPTT until levels correlate Daily CBC and monitor for s/sx bleeding  Ewing Schlein, PharmD PGY1 Pharmacy Resident 03/15/2019    8:36 AM Please check AMION for all [facility] Pharmacy numbers '

## 2019-03-15 NOTE — Progress Notes (Signed)
NAME:  Joel Rogers, MRN:  [id number], DOB:  [birth year], LOS: 2 ADMISSION DATE:  03/14/2019, CONSULTATION DATE:  03/19/2019 REFERRING MD: Criss Alvine, EM  , CHIEF COMPLAINT:  Cardiac arrest   Brief History   68 yo M discharged today from rehab, became unresponsive while being driven home for about 10 minutes until arriving to ED. At ED found to be pulseless, unresponsive. ACLS initiated, ROSC after approx 10 minutes. Now intubated. CT Head no acute process.   Past Medical History   Anemia     low iron  . Arthritis    bilateral knees  . Atrial fibrillation, chronic   . Chronic diastolic CHF (congestive heart failure) (HCC) 06/15/2018  . History of subdural hematoma   . Hyperlipidemia   . Hypertension   . Morbid obesity (HCC)   . Normal coronary arteries    by cardiac catheterization performed 03/14/06  . Pre-diabetes    pt denies  . S/P TAVR (transcatheter aortic valve replacement)    a. 07/25/18: Edwards Sapien 3 THV (size 26 mm, model # 9600TFX, serial # [id number]) by Dr. Laneta Simmers and Dr. Clifton James  . Sleep apnea    on CPAP  . Venous insufficiency     Significant Hospital Events   4/21> 10 minutes ACLS with ROSC. Intubated. CT Head no acute process. Admit to ICU   Consults:  PCCM  Neuro Cardiology  Procedures:  ETT 4/21>   Significant Diagnostic Tests:  CT Head 4/21> no acute abnormality. Prior SDH drainage on right.  TTE 4/21 >  EF >65% and no WMA, small pericardial effusion, otherwise no changes. Biatrial dilation. MRI brain 4/22> severe anoxic brain injury  Micro Data:  4/21 BCx> 4/21 Tracheal aspirate >   Antimicrobials:  4/21 vanc/cefepime>   Interim history/subjective:  No changes; has had adequate control of myoclonus on current dose of versed gtt.  Objective   Blood pressure 130/79, pulse 92, temperature 99.5 F (37.5 C), resp. rate 16, height  (1.854 m), weight 108.3 kg, SpO2 100 %.    Vent Mode: PRVC FiO2 (%):  [40 %] 40 % Set  Rate:  [16 bmp] 16 bmp Vt Set:  [630 mL] 630 mL PEEP:  [5 cmH20] 5 cmH20 Pressure Support:  [15 cmH20] 15 cmH20 Plateau Pressure:  [18 cmH20-21 cmH20] 19 cmH20   Intake/Output Summary (Last 24 hours) at 03/15/2019 0733 Last data filed at 03/15/2019 0600 Gross per 24 hour  Intake 3382.21 ml  Output 650 ml  Net 2732.21 ml   Filed Weights   03/14/19 0500 03/15/19 0405  Weight: 103.6 kg 108.3 kg   Physical Exam: General: Chronically ill-appearing, sedated HENT: [place], AT, ETT in place Eyes: EOMI, no scleral icterus Respiratory: Clear to auscultation bilaterally. No crackles, wheezing or rales Cardiovascular: Irregular rhythm, murmur present, no JVD GI: BS+, soft, nontender Extremities: 1+ pitting lower extremity bilaterally R>L, induration and skin discoloration on RLE Neuro: Sedated, does not respond to pain, 3mm pupils, left pupil reactive, right pupil does not appear to be reactive. GU: Foley in place  Resolved Hospital Problem list     Assessment & Plan:  Mr. Granville Whitefield is a 68 year old male with multiple comorbidities who became unresponsive on the day of discharge from rehab.  Wife brought him to the ED and he was found in cardiac arrest.  Patient was pulled from car and CPR started.  Approximate downtime was 10 minutes in the car and 10 minutes in the ED, epi x2 in ED.  Interval development  of full-body myoclonus concerning for anoxic brain injury, with MRI brain 4/22 confirming severe anoxic brain injury. Discussion underway on transition to comfort care with family.  S/p Cardiac Arrest Atrial fibrillation S/p TAVR CAD with lateral ramus lesion stenosis (99%) HTN Received 10 minute CPR, 2x epi with rosc.prior history of VFib arrest.  Peak troponin 0.31. TTE with normal EF and no WMA, small pericardial effusion and severe bioprosthetic stenosis compared to recent echo in March. Per cardiology this suggests valve thrombosis vs endocarditis with obstruction and could have been  the inciting incident for cardiac arrest. -Cardiology on board. -Heparin drip -Telemetry -Hold anti-hypertensive agents  Acute respiratory failure  Secondary to cardiac arrest -Full vent support -VAP -F/u trach asp, cultures  Acute encephalopathy History of recent embolic CVA Severe anoxic brain injury with myoclonus CT H 4/21 negative for acute process; MRI brain 4/22 showing severe anoxic brain injury. -PAD protocol, RASS goal 0, -1  -continue keppra  Anion gap acidosis secondary to lactic acidosis Lactic acidosis likely related to recent tissue hypoperfusion; trended down yesterday  History of Strep bacteremia/cellulitis S/p abx treatment -Follow up BCx, tracheal aspirate -Continue broad spectrum antibiotics. De-escalate pending culture data  Global: Discussed with family 4/22 the overall poor prognosis. He has been made DNR; family will discuss on transition to comfort care.  Best practice:  Diet: TF Pain/Anxiety/Delirium protocol (if indicated): Versed gtt. PRN fentanyl/versed  VAP protocol (if indicated): yes  DVT prophylaxis: heparin GI prophylaxis: Protonix  Glucose control: SSI  Mobility: bedrest  Code Status: full  Family Communication: discussed 4/22; will contact again later this morning Disposition: ICU  Labs   CBC: Recent Labs  Lab 03/09/19 1138 02/21/2019 1201 02/21/2019 1325 02/23/2019 2033 03/14/19 1051 03/15/19 0355  WBC 10.9* 13.9*  --   --  17.5* 18.8*  NEUTROABS  --  8.9*  --   --   --   --   HGB 8.9* 9.0* 8.5* 8.5* 8.9* 9.1*  HCT 28.3* 30.8* 25.0* 25.0* 27.2* 27.7*  MCV 77.3* 85.1  --   --  75.6* 75.3*  PLT 234 172  --   --  191 214    Basic Metabolic Panel: Recent Labs  Lab 03/20/2019 0555 03/18/2019 1201 03/18/2019 1325 02/23/2019 1543 03/05/2019 1953 03/05/2019 2033 03/14/19 0351 03/15/19 0355  NA 136 136 137  --  137 137 137 138  K 4.3 5.0 4.2  --  4.4 4.3 4.6 3.7  CL 108 108  --   --  110  --  108 111  CO2 20* 10*  --   --  17*  --   15* 17*  GLUCOSE 95 198*  --   --  150*  --  130* 127*  BUN 12 12  --   --  17  --  17 25*  CREATININE 0.97 1.12  --   --  1.26*  --  1.22 1.10  CALCIUM 8.7* 8.9  --   --  8.4*  --  8.5* 8.5*  MG  --   --   --  1.9 1.9  --  1.8 2.1  PHOS  --   --   --  4.2 4.1  --  3.9 3.3   GFR: Estimated Creatinine Clearance: 83 mL/min (by C-G formula based on SCr of 1.1 mg/dL). Recent Labs  Lab 03/09/19 1138 03/20/2019 1201 03/07/2019 1543 03/08/2019 1828 03/14/19 1051 03/15/19 0355  PROCALCITON  --   --  0.60  --   --   --  WBC 10.9* 13.9*  --   --  17.5* 18.8*  LATICACIDVEN  --   --  3.0* 3.4* 2.9*  --     Liver Function Tests: Recent Labs  Lab 03-22-2019 1201 03/22/19 1953 03/15/19 0355  AST 38 58*  --   ALT 21 33  --   ALKPHOS 120 120  --   BILITOT 0.7 1.1  --   PROT 6.6 6.6  --   ALBUMIN 2.2* 2.3* 2.2*   No results for input(s): LIPASE, AMYLASE in the last 168 hours. No results for input(s): AMMONIA in the last 168 hours.  ABG    Component Value Date/Time   PHART 7.440 22-Mar-2019 2033   PCO2ART 24.2 (L) Mar 22, 2019 2033   PO2ART 118.0 (H) March 22, 2019 2033   HCO3 16.4 (L) 2019/03/22 2033   TCO2 17 (L) 2019-03-22 2033   ACIDBASEDEF 7.0 (H) 03/22/2019 2033   O2SAT 99.0 March 22, 2019 2033     Coagulation Profile: Recent Labs  Lab 03-22-2019 1201 March 22, 2019 1953  INR 1.9* 1.5*    Cardiac Enzymes: Recent Labs  Lab March 22, 2019 1201 22-Mar-2019 1543 03-22-2019 1828 03/14/19 0351  TROPONINI 0.06* 0.22* 0.31* 0.30*    HbA1C: Hemoglobin A1C  Date/Time Value Ref Range Status  01/22/2016 6.2  Final   Hgb A1c MFr Bld  Date/Time Value Ref Range Status  07/21/2018 10:20 AM 5.8 (H) 4.8 - 5.6 % Final    Comment:    (NOTE) Pre diabetes:          5.7%-6.4% Diabetes:              >6.4% Glycemic control for   <7.0% adults with diabetes   11/26/2016 02:06 PM 5.6 <5.7 % Final    Comment:      For the purpose of screening for the presence of diabetes:   <5.7%       Consistent with the  absence of diabetes 5.7-6.4 %   Consistent with increased risk for diabetes (prediabetes) >=6.5 %     Consistent with diabetes   This assay result is consistent with a decreased risk of diabetes.   Currently, no consensus exists regarding use of hemoglobin A1c for diagnosis of diabetes in children.   According to American Diabetes Association (ADA) guidelines, hemoglobin A1c <7.0% represents optimal control in non-pregnant diabetic patients. Different metrics may apply to specific patient populations. Standards of Medical Care in Diabetes (ADA).       CBG: Recent Labs  Lab 03/22/2019 1144 2019/03/22 1528 03/22/19 1956 22-Mar-2019 2359 03/14/19 0352  GLUCAP 172* 144* 126* 122* 123*    Nyra Market, MD PGY3 Pager [phone]

## 2019-03-15 NOTE — Progress Notes (Signed)
Patient ID: Joel Rogers, male   DOB: [birth year], 68 y.o.   MRN: [id number]     PCP-Cardiologist: Quay Burow, MD   Subjective:    Patient remains intubated and unresponsive, myoclonus suppressed by Versed and Keppra.  He was not cooled initially due to significant myoclonus.   BP stable, not on pressors.  CT head with no acute findings on 4/21. MRI head 4/22 showed severe anoxic injury.   Echo (03/09/2019): EF >65%, moderate LVH, mildly decreased RV systolic function, bioprosthetic AoV s/p TAVR with mean gradient 44 mmHg and AVA 0.56 cm^2, this suggests severe bioprosthetic stenosis and is markedly different compared to 3/20 echo (mean gradient 14 mmHg) => suggest valve thrombosis versus endocarditis with obstruction (valve not well-visualized).   EEG: Severe background attenuation with myoclonus.    Objective:   Weight Range: 108.3 kg Body mass index is 31.5 kg/m.   Vital Signs:   Temp:  [98.1 F (36.7 C)-99.5 F (37.5 C)] 99.3 F (37.4 C) (04/23 0900) Pulse Rate:  [70-95] 86 (04/23 0900) Resp:  [16-39] 16 (04/23 0900) BP: (114-145)/(67-91) 117/77 (04/23 0900) SpO2:  [98 %-100 %] 100 % (04/23 0900) FiO2 (%):  [40 %] 40 % (04/23 0713) Weight:  [108.3 kg] 108.3 kg (04/23 0405) Last BM Date: 02/21/2019  Weight change: Filed Weights   03/14/19 0500 03/15/19 0405  Weight: 103.6 kg 108.3 kg    Intake/Output:   Intake/Output Summary (Last 24 hours) at 03/15/2019 0912 Last data filed at 03/15/2019 0900 Gross per 24 hour  Intake 3436.98 ml  Output 570 ml  Net 2866.98 ml      Physical Exam    General: Intubated/sedated.  Neck: No JVD, no thyromegaly or thyroid nodule.  Lungs: Clear to auscultation bilaterally with normal respiratory effort. CV: Nondisplaced PMI.  Heart irregular S1/S2, no S3/S4, 2/6 early SEM RUSB.  1+ ankle edema.   Abdomen: Soft, nontender, no hepatosplenomegaly, no distention.  Skin: Intact without lesions or rashes.  Neurologic: Unresponsive  Extremities: No clubbing or cyanosis.  HEENT: Normal.    Telemetry   Atrial fibrillation with rate in 80s (personally reviewed).   Labs    CBC Recent Labs    03/04/2019 1201  03/14/19 1051 03/15/19 0355  WBC 13.9*  --  17.5* 18.8*  NEUTROABS 8.9*  --   --   --   HGB 9.0*   < > 8.9* 9.1*  HCT 30.8*   < > 27.2* 27.7*  MCV 85.1  --  75.6* 75.3*  PLT 172  --  191 214   < > = values in this interval not displayed.   Basic Metabolic Panel Recent Labs    03/14/19 0351 03/15/19 0355  NA 137 138  K 4.6 3.7  CL 108 111  CO2 15* 17*  GLUCOSE 130* 127*  BUN 17 25*  CREATININE 1.22 1.10  CALCIUM 8.5* 8.5*  MG 1.8 2.1  PHOS 3.9 3.3   Liver Function Tests Recent Labs    03/19/2019 1201 03/01/2019 1953 03/15/19 0355  AST 38 58*  --   ALT 21 33  --   ALKPHOS 120 120  --   BILITOT 0.7 1.1  --   PROT 6.6 6.6  --   ALBUMIN 2.2* 2.3* 2.2*   No results for input(s): LIPASE, AMYLASE in the last 72 hours. Cardiac Enzymes Recent Labs    02/28/2019 1543 02/22/2019 1828 03/14/19 0351  TROPONINI 0.22* 0.31* 0.30*    BNP: BNP (last 3 results) Recent Labs  07/21/18 1019 02/03/19 0834 03/10/2019 1201  BNP 376.1* 408.1* 2,003.2*    ProBNP (last 3 results) No results for input(s): PROBNP in the last 8760 hours.   D-Dimer No results for input(s): DDIMER in the last 72 hours. Hemoglobin A1C No results for input(s): HGBA1C in the last 72 hours. Fasting Lipid Panel Recent Labs    03/14/19 0351  TRIG 46   Thyroid Function Tests No results for input(s): TSH, T4TOTAL, T3FREE, THYROIDAB in the last 72 hours.  Invalid input(s): FREET3  Other results:   Imaging    Mr Brain Wo Contrast  Result Date: 03/14/2019 CLINICAL DATA:  Status post cardiac arrest.  Anoxic injury. EXAM: MRI HEAD WITHOUT CONTRAST TECHNIQUE: Multiplanar, multiecho pulse sequences of the brain and surrounding structures were obtained without intravenous contrast. COMPARISON:  MRI brain 01/19/2019. CT  head, most recent 03/12/2019 FINDINGS: Brain: Low to moderate level restricted diffusion of an extensive nature, relatively symmetric, involving the gray matter of cerebellar hemispheres, BILATERAL cerebral cortex, and BILATERAL putamen. The hippocampi and caudate nuclei may also be involved. This pattern is consistent with history of cardiac arrest/severe anoxic injury. No incipient herniation or midline shift. There are a few small foci, subcentimeter size, of more focal restricted diffusion, subcortical white matter both frontal lobes, appear to represent subacute time course of the previous noted acute infarcts. Elsewhere, generalized atrophy. Areas of subacute to chronic cortical and cerebellar infarction were more acute on the prior exam of 01/19/2019. Minor periventricular white matter disease. Chronic LEFT cerebellar hemorrhage. Vascular: Normal flow voids. Skull and upper cervical spine: Normal marrow signal. Sinuses/Orbits: Negative. Other: None. IMPRESSION: Restricted diffusion throughout the cerebral cortex, cerebellum, and basal ganglia consistent with history of cardiac arrest/severe anoxic injury. A few small foci of focally restricted diffusion in the bifrontal subcortical white matter are felt to represent subacute time course of the infarcts from previous embolic disease. Generalized atrophy. Electronically Signed   By: Staci Righter M.D.   On: 03/14/2019 14:11   Vas [place] Lower Extremity Venous (dvt)  Result Date: 03/14/2019  Lower Venous Study Indications: Edema.  Performing Technologist: Abram Sander RVS  Examination Guidelines: A complete evaluation includes B-mode imaging, spectral Doppler, color Doppler, and power Doppler as needed of all accessible portions of each vessel. Bilateral testing is considered an integral part of a complete examination. Limited examinations for reoccurring indications may be performed as noted.   +---------+---------------+---------+-----------+----------+--------------+ RIGHT    CompressibilityPhasicitySpontaneityPropertiesSummary        +---------+---------------+---------+-----------+----------+--------------+ CFV      Full           Yes      Yes                                 +---------+---------------+---------+-----------+----------+--------------+ SFJ      Full                                                        +---------+---------------+---------+-----------+----------+--------------+ FV Prox  Full                                                        +---------+---------------+---------+-----------+----------+--------------+  FV Mid   Full                                                        +---------+---------------+---------+-----------+----------+--------------+ FV DistalFull                                                        +---------+---------------+---------+-----------+----------+--------------+ PFV      Full                                                        +---------+---------------+---------+-----------+----------+--------------+ POP      Full           Yes      Yes                                 +---------+---------------+---------+-----------+----------+--------------+ PTV      Full                                                        +---------+---------------+---------+-----------+----------+--------------+ PERO                                                  Not visualized +---------+---------------+---------+-----------+----------+--------------+   +---------+---------------+---------+-----------+----------+--------------+ LEFT     CompressibilityPhasicitySpontaneityPropertiesSummary        +---------+---------------+---------+-----------+----------+--------------+ CFV      Full           Yes      Yes                                  +---------+---------------+---------+-----------+----------+--------------+ SFJ      Full                                                        +---------+---------------+---------+-----------+----------+--------------+ FV Prox  Full                                                        +---------+---------------+---------+-----------+----------+--------------+ FV Mid   Full                                                        +---------+---------------+---------+-----------+----------+--------------+  FV DistalFull                                                        +---------+---------------+---------+-----------+----------+--------------+ PFV      Full                                                        +---------+---------------+---------+-----------+----------+--------------+ POP      Full           Yes      Yes                                 +---------+---------------+---------+-----------+----------+--------------+ PTV      Full                                                        +---------+---------------+---------+-----------+----------+--------------+ PERO                                                  Not visualized +---------+---------------+---------+-----------+----------+--------------+     Summary: Right: There is no evidence of deep vein thrombosis in the lower extremity. However, portions of this examination were limited- see technologist comments above. No cystic structure found in the popliteal fossa. Left: There is no evidence of deep vein thrombosis in the lower extremity. However, portions of this examination were limited- see technologist comments above. No cystic structure found in the popliteal fossa.  *See table(s) above for measurements and observations. Electronically signed by Curt Jews MD on 03/14/2019 at 4:03:25 PM.    Final      Medications:     Scheduled Medications: . chlorhexidine gluconate (MEDLINE  KIT)  15 mL Mouth Rinse BID  . feeding supplement (PRO-STAT SUGAR FREE 64)  60 mL Per Tube QID  . mouth rinse  15 mL Mouth Rinse 10 times per day  . pantoprazole (PROTONIX) IV  40 mg Intravenous QHS    Infusions: . sodium chloride 50 mL/hr at 03/15/19 0900  . ceFEPime (MAXIPIME) IV Stopped (03/15/19 0709)  . feeding supplement (VITAL AF 1.2 CAL) 25 mL/hr at 03/14/19 2100  . heparin 1,400 Units/hr (03/15/19 0900)  . levETIRAcetam Stopped (03/15/19 0800)  . midazolam 3 mg/hr (03/15/19 0900)  . vancomycin Stopped (03/14/19 1745)    PRN Medications: acetaminophen, albuterol, insulin aspart, midazolam, ondansetron (ZOFRAN) IV    Patient Profile   MALAKYE NOLDEN is a 68 y.o. male with a hx of severe AS with TAVR 9/19, Vfib arrest 3/20, NSTEMI 8/75, Chronic diastolic HF, chronic Afib, GIB, CVA 2/20 who is being seen for the evaluation of cardiac arrest at the request of Dr. Valeta Harms.  Assessment/Plan   1. Cardiac arrest: PEA, was in car on way home from CIR after long hospitalization.  Unresponsive in car x 10 minutes then had  10 minutes CPR in ER.  ROSC after 2 doses of epinephrine, no defibrillation.  ?Bioprosthetic valve thrombosis as trigger.  2. Acute hypoxemic respiratory failure: Intubated in ER. CXR with patchy infiltrates LUL and RLL that may be related to aspiration.  Per CCM.  3. CAD: NSTEMI 3/20 with 99% thrombotic stenosis in branch of ramus, medical management.  TnI only mildly elevated (maximal 0.3) this admission, suspect demand ischemia with hypotension in setting of cardiac arrest.  4. Bioprosthetic aortic valve stenosis: Echo reviewed, there has been significant elevation in mean gradient across the aortic valve to 49 mmHg since last echo in 3/20 (mean gradient 14 mmHg at that time).  I am concerned that bioprosthetic valve thrombosis versus endocarditis with obstruction could have led to PEA arrest (unfortunately, valve not well-visualized).  He was on Eliquis prior to  admission and it appears that he was getting all his doses. Blood cultures negative so far, WBCs elevated at 18.8, Tm 99.5.  - Continue vancomycin/cefepime.   - Would need further workup with TEE, etc, but at this point, with minimal neurologic recovery, would hold off on further imaging. - Continue heparin gtt.  5. Atrial fibrillation: Chronic, rate is controlled. Has h/o CVA (while off anticoagulation for prior traumatic SDH).  - Heparin gtt as above.  6. Chronic diastolic CHF: Stable, not markedly volume overloaded.   7. Neuro: Suspect anoxic encephalopathy.  Not responsive, significant myoclonus before Versed started.  MRI head with severe anoxic changes.  Neurology following, prognosis poor.   - Plan to discuss transition to comfort measures with family today.   Length of Stay: 2  Loralie Champagne, MD  03/15/2019, 9:12 AM  Advanced Heart Failure Team Pager [phone] (M-F; 7a - 4p)  Please contact [facility] for night-coverage after hours (4p -7a ) and weekends on [email or web address]

## 2019-03-16 LAB — CBC
HCT: 27.9 % — ABNORMAL LOW (ref 39.0–52.0)
Hemoglobin: 9 g/dL — ABNORMAL LOW (ref 13.0–17.0)
MCH: 24.8 pg — ABNORMAL LOW (ref 26.0–34.0)
MCHC: 32.3 g/dL (ref 30.0–36.0)
MCV: 76.9 fL — ABNORMAL LOW (ref 80.0–100.0)
Platelets: 188 10*3/uL (ref 150–400)
RBC: 3.63 MIL/uL — ABNORMAL LOW (ref 4.22–5.81)
RDW: 19.5 % — ABNORMAL HIGH (ref 11.5–15.5)
WBC: 17.8 10*3/uL — ABNORMAL HIGH (ref 4.0–10.5)
nRBC: 0 % (ref 0.0–0.2)

## 2019-03-16 LAB — RENAL FUNCTION PANEL
Albumin: 1.8 g/dL — ABNORMAL LOW (ref 3.5–5.0)
Anion gap: 5 (ref 5–15)
BUN: 30 mg/dL — ABNORMAL HIGH (ref 8–23)
CO2: 17 mmol/L — ABNORMAL LOW (ref 22–32)
Calcium: 8.1 mg/dL — ABNORMAL LOW (ref 8.9–10.3)
Chloride: 118 mmol/L — ABNORMAL HIGH (ref 98–111)
Creatinine, Ser: 0.94 mg/dL (ref 0.61–1.24)
GFR calc Af Amer: >60 mL/min (ref >60)
GFR calc non Af Amer: >60 mL/min (ref >60)
Glucose, Bld: 102 mg/dL — ABNORMAL HIGH (ref 70–99)
Phosphorus: 3 mg/dL (ref 2.5–4.6)
Potassium: 5.8 mmol/L — ABNORMAL HIGH (ref 3.5–5.1)
Sodium: 140 mmol/L (ref 135–145)

## 2019-03-16 LAB — MAGNESIUM: Magnesium: 2 mg/dL (ref 1.7–2.4)

## 2019-03-16 LAB — HEPARIN LEVEL (UNFRACTIONATED): Heparin Unfractionated: 0.52 IU/mL (ref 0.30–0.70)

## 2019-03-16 LAB — GLUCOSE, CAPILLARY
Glucose-Capillary: 103 mg/dL — ABNORMAL HIGH (ref 70–99)
Glucose-Capillary: 106 mg/dL — ABNORMAL HIGH (ref 70–99)
Glucose-Capillary: 108 mg/dL — ABNORMAL HIGH (ref 70–99)
Glucose-Capillary: 91 mg/dL (ref 70–99)
Glucose-Capillary: 96 mg/dL (ref 70–99)
Glucose-Capillary: 96 mg/dL (ref 70–99)
Glucose-Capillary: 99 mg/dL (ref 70–99)

## 2019-03-16 LAB — APTT: aPTT: 74 seconds — ABNORMAL HIGH (ref 24–36)

## 2019-03-16 MED ORDER — SODIUM CHLORIDE 0.9 % IV SOLN
2.0000 g | INTRAVENOUS | Status: DC
  Administered 2019-03-16 – 2019-03-17 (×2): 2 g via INTRAVENOUS
  Filled 2019-03-16 (×3): qty 20

## 2019-03-16 NOTE — Progress Notes (Signed)
PHARMACY ANTIBIOTIC AND ANTICOAGULATION CONSULT NOTE    Pharmacy Consult for Heparin Indication: VTE   Pharmacy Consult for Vancomycin and Cefepime Indication: pneumonia  Allergies  Allergen Reactions  . Kayexalate [Polystyrene] Other (See Comments)    Recommended to avoid by GI due to ischemic gastropathy    Patient Measurements: Height: 6\' 1"  (185.4 cm) Weight: 246 lb 7.6 oz (111.8 kg) IBW/kg (Calculated) : 79.9 Heparin Dosing Weight: 106kg  Vital Signs: Temp: 97.2 F (36.2 C) (04/24 1136) Temp Source: Core (04/24 1136) BP: 111/76 (04/24 1134) Pulse Rate: 61 (04/24 1134)  Labs: Recent Labs    11-25-18 1543 11-25-18 1828  11-25-18 1953  03/14/19 0351 03/14/19 1051 03/14/19 1555 03/14/19 2203 03/15/19 0355 03/16/19 0402 03/16/19 0829  HGB  --   --   --   --    < >  --  8.9*  --   --  9.1* 9.0*  --   HCT  --   --   --   --    < >  --  27.2*  --   --  27.7* 27.9*  --   PLT  --   --   --   --   --   --  191  --   --  214 188  --   APTT 39*  --   --   --   --  40*  --  99* 84* 95* 74*  --   LABPROT  --   --   --  17.7*  --   --   --   --   --   --   --   --   INR  --   --   --  1.5*  --   --   --   --   --   --   --   --   HEPARINUNFRC  --   --   --   --    < > 1.74*  --  1.52*  --  1.04* 0.52  --   CREATININE  --   --    < > 1.26*  --  1.22  --   --   --  1.10  --  0.94  TROPONINI 0.22* 0.31*  --   --   --  0.30*  --   --   --   --   --   --    < > = values in this interval not displayed.    Estimated Creatinine Clearance: 98.6 mL/min (by C-G formula based on SCr of 0.94 mg/dL).   Medical History: Past Medical History:  Diagnosis Date  . Anemia    low iron  . Arthritis    bilateral knees  . Atrial fibrillation, chronic   . Chronic diastolic CHF (congestive heart failure) (HCC) 06/15/2018  . History of subdural hematoma   . Hyperlipidemia   . Hypertension   . Morbid obesity (HCC)   . Myocardial infarction (HCC)   . Normal coronary arteries    by  cardiac catheterization performed 03/14/06  . Pre-diabetes    pt denies  . S/P TAVR (transcatheter aortic valve replacement)    a. 07/25/18: Edwards Sapien 3 THV (size 26 mm, model # 9600TFX, serial # [id number]) by Dr. Laneta SimmersBartle and Dr. Clifton JamesMcAlhany  . Sleep apnea    on CPAP  . Stroke (HCC)   . Venous insufficiency   . VF (ventricular fibrillation) (HCC) causing cardiac arrest 02/03/2019  Assessment: 43 yoM on apixaban for hx PAF discharged from hospital 4/21 and subsequently had cardiopulmonary arrest, suspected cause is valve thrombosis. Pharmacy asked to start IV heparin. Last Eliquis dose 4/21 at 0850. Pharmacy also consulted for vancomycin and cefepime dosing for possible bronchopneumonia on CXR. Scr ~1 stable, WBC 17.8, Tmax 101  APTT therapeutic at 74, heparin level 0.52, suspect the levels are beginning to correlate, will draw both levels again tomorrow to confirm. CBC stable. No active bleed issues documented.  Vanc 4/21 >> Cefepime 4/21 >>  4/21 BCx: ngtd x3 4/21 MRSA pcr neg  Anticoagulation Goal of Therapy:  Heparin level 0.3-0.7 units/ml aPTT 66-102 seconds Monitor platelets by anticoagulation protocol: Yes   Plan:  Continue IV heparin infusion at 1400 units/hr  Daily heparin level/aPTT until levels correlate Daily CBC and monitor for s/sx bleeding  Vancomycin 1750mg  IV Q24H for AUC 509 (Goal 400-550) Cefepime 2gm IV Q8H Monitor renal fxn, clinical progress, vanc AUC as indicated  Ewing Schlein, PharmD PGY1 Pharmacy Resident 03/16/2019    12:17 PM Please check AMION for all [facility] Pharmacy numbers

## 2019-03-16 NOTE — Progress Notes (Signed)
NAME:  Joel Rogers, MRN:  [id number], DOB:  08-Sep-1951, LOS: 3 ADMISSION DATE:  04-06-2019, CONSULTATION DATE:  04/06/2019 REFERRING MD: Criss Alvine, EM  , CHIEF COMPLAINT:  Cardiac arrest   Brief History   68 yo M discharged today from rehab, became unresponsive while being driven home for about 10 minutes until arriving to ED. At ED found to be pulseless, unresponsive. ACLS initiated, ROSC after approx 10 minutes. Now intubated. CT Head no acute process.   Past Medical History   Anemia     low iron  . Arthritis    bilateral knees  . Atrial fibrillation, chronic   . Chronic diastolic CHF (congestive heart failure) (HCC) 06/15/2018  . History of subdural hematoma   . Hyperlipidemia   . Hypertension   . Morbid obesity (HCC)   . Normal coronary arteries    by cardiac catheterization performed 03/14/06  . Pre-diabetes    pt denies  . S/P TAVR (transcatheter aortic valve replacement)    a. 07/25/18: Edwards Sapien 3 THV (size 26 mm, model # 9600TFX, serial # [id number]) by Dr. Laneta Simmers and Dr. Clifton James  . Sleep apnea    on CPAP  . Venous insufficiency     Significant Hospital Events   4/21> 10 minutes ACLS with ROSC. Intubated. CT Head no acute process. Admit to ICU 4/22> MRI showing diffuse anoxic brain injury   Consults:  PCCM  Neuro Cardiology  Procedures:  ETT 4/21>   Significant Diagnostic Tests:  CT Head 4/21> no acute abnormality. Prior SDH drainage on right.  TTE 4/21 >  EF >65% and no WMA, small pericardial effusion, otherwise no changes. Biatrial dilation. MRI brain 4/22> severe anoxic brain injury  Micro Data:  4/21 BCx> 4/21 Tracheal aspirate >   Antimicrobials:  4/21 vanc/cefepime> plan to stop 4/24  Interim history/subjective:  No acute events overnight. Myoclonus controlled on versed gtt.  Objective   Blood pressure 117/73, pulse 61, temperature 98.2 F (36.8 C), temperature source Core, resp. rate 16, height  (1.854 m), weight  111.8 kg, SpO2 100 %.    Vent Mode: PRVC FiO2 (%):  [40 %] 40 % Set Rate:  [16 bmp] 16 bmp Vt Set:  [630 mL] 630 mL PEEP:  [5 cmH20] 5 cmH20 Plateau Pressure:  [17 cmH20-23 cmH20] 23 cmH20   Intake/Output Summary (Last 24 hours) at 03/16/2019 0924 Last data filed at 03/16/2019 0700 Gross per 24 hour  Intake 2897.51 ml  Output 1065 ml  Net 1832.51 ml   Filed Weights   03/14/19 0500 03/15/19 0405 03/16/19 0409  Weight: 103.6 kg 108.3 kg 111.8 kg   Physical Exam: General: Chronically ill-appearing, sedated HENT: Turner, AT, ETT in place Eyes: EOMI, no scleral icterus Respiratory: Clear to auscultation bilaterally. No crackles, wheezing or rales Cardiovascular: Irregular rhythm, murmur present, no JVD GI: decreased bowel sounds, soft, nontender Extremities: 1+ pitting lower extremity bilaterally R>L, induration and skin discoloration on RLE Neuro: Sedated, does not respond to pain, 3mm pupils non reactive bilaterally GU: Foley in place  Resolved Hospital Problem list     Assessment & Plan:  [name] is a 68 year old male with multiple comorbidities who became unresponsive on the day of discharge from rehab.  Wife brought him to the ED and he was found in cardiac arrest.  Patient was pulled from car and CPR started.  Approximate downtime was 10 minutes in the car and 10 minutes in the ED, epi x2 in ED.  Interval development of  full-body myoclonus concerning for anoxic brain injury, with MRI brain 4/22 confirming severe anoxic brain injury. After discussing with family, he has been made DNR and plan is to transition to comfort care with terminal extubation later today.  S/p Cardiac Arrest Atrial fibrillation S/p TAVR CAD with lateral ramus lesion stenosis (99%) HTN Received 10 minute CPR, 2x epi with rosc.prior history of VFib arrest.  Peak troponin 0.31. TTE with normal EF and no WMA, small pericardial effusion and severe bioprosthetic stenosis compared to recent echo in  March. Per cardiology this suggests valve thrombosis vs endocarditis with obstruction and could have been the inciting incident for cardiac arrest. -Cardiology on board -Heparin drip -Telemetry -Hold anti-hypertensive agents  Acute respiratory failure  Secondary to cardiac arrest -Full vent support -VAP  Acute encephalopathy History of recent embolic CVA Severe anoxic brain injury with myoclonus CT H 4/21 negative for acute process; MRI brain 4/22 showing severe anoxic brain injury. -PAD protocol, RASS goal 0, -1  -continue keppra  Anion gap acidosis secondary to lactic acidosis Lactic acidosis likely related to recent tissue hypoperfusion; trended down   History of Strep bacteremia/cellulitis S/p abx treatment -Follow up BCx, tracheal aspirate -Continue broad spectrum antibiotics.   Global: Discussed with family 4/22 and 4/23 the overall poor prognosis. He has been made DNR; plan to transition to comfort care 4/24. Continue current management until then with no escalation of care.  Best practice:  Diet: NPO Pain/Anxiety/Delirium protocol (if indicated): Versed gtt. PRN fentanyl/versed  VAP protocol (if indicated): yes  DVT prophylaxis: heparin GI prophylaxis: Protonix  Glucose control: SSI  Mobility: bedrest  Code Status: full  Family Communication: called wife on 4/23; family coming in today Disposition: ICU, plan to transition to comfort care later this morning  Labs   CBC: Recent Labs  Lab 03/09/19 1138 03/06/2019 1201 02/22/2019 1325 02/26/2019 2033 03/14/19 1051 03/15/19 0355 03/16/19 0402  WBC 10.9* 13.9*  --   --  17.5* 18.8* 17.8*  NEUTROABS  --  8.9*  --   --   --   --   --   HGB 8.9* 9.0* 8.5* 8.5* 8.9* 9.1* 9.0*  HCT 28.3* 30.8* 25.0* 25.0* 27.2* 27.7* 27.9*  MCV 77.3* 85.1  --   --  75.6* 75.3* 76.9*  PLT 234 172  --   --  191 214 188    Basic Metabolic Panel: Recent Labs  Lab 02/24/2019 0555 03/03/2019 1201 03/11/2019 1325 03/12/2019 1543 02/27/2019  1953 03/05/2019 2033 03/14/19 0351 03/15/19 0355  NA 136 136 137  --  137 137 137 138  K 4.3 5.0 4.2  --  4.4 4.3 4.6 3.7  CL 108 108  --   --  110  --  108 111  CO2 20* 10*  --   --  17*  --  15* 17*  GLUCOSE 95 198*  --   --  150*  --  130* 127*  BUN 12 12  --   --  17  --  17 25*  CREATININE 0.97 1.12  --   --  1.26*  --  1.22 1.10  CALCIUM 8.7* 8.9  --   --  8.4*  --  8.5* 8.5*  MG  --   --   --  1.9 1.9  --  1.8 2.1  PHOS  --   --   --  4.2 4.1  --  3.9 3.3   GFR: Estimated Creatinine Clearance: 84.3 mL/min (by C-G formula based on SCr  of 1.1 mg/dL). Recent Labs  Lab 05/20/2019 1201 05/20/2019 1543 05/20/2019 1828 03/14/19 1051 03/15/19 0355 03/16/19 0402  PROCALCITON  --  0.60  --   --   --   --   WBC 13.9*  --   --  17.5* 18.8* 17.8*  LATICACIDVEN  --  3.0* 3.4* 2.9*  --   --     Liver Function Tests: Recent Labs  Lab 05/20/2019 1201 05/20/2019 1953 03/15/19 0355  AST 38 58*  --   ALT 21 33  --   ALKPHOS 120 120  --   BILITOT 0.7 1.1  --   PROT 6.6 6.6  --   ALBUMIN 2.2* 2.3* 2.2*   No results for input(s): LIPASE, AMYLASE in the last 168 hours. No results for input(s): AMMONIA in the last 168 hours.  ABG    Component Value Date/Time   PHART 7.440 06/28/202020 2033   PCO2ART 24.2 (L) 06/28/202020 2033   PO2ART 118.0 (H) 06/28/202020 2033   HCO3 16.4 (L) 06/28/202020 2033   TCO2 17 (L) 06/28/202020 2033   ACIDBASEDEF 7.0 (H) 06/28/202020 2033   O2SAT 99.0 06/28/202020 2033     Coagulation Profile: Recent Labs  Lab 05/20/2019 1201 05/20/2019 1953  INR 1.9* 1.5*    Cardiac Enzymes: Recent Labs  Lab 05/20/2019 1201 05/20/2019 1543 05/20/2019 1828 03/14/19 0351  TROPONINI 0.06* 0.22* 0.31* 0.30*    HbA1C: Hemoglobin A1C  Date/Time Value Ref Range Status  01/22/2016 6.2  Final   Hgb A1c MFr Bld  Date/Time Value Ref Range Status  07/21/2018 10:20 AM 5.8 (H) 4.8 - 5.6 % Final    Comment:    (NOTE) Pre diabetes:          5.7%-6.4% Diabetes:              >6.4%  Glycemic control for   <7.0% adults with diabetes   11/26/2016 02:06 PM 5.6 <5.7 % Final    Comment:      For the purpose of screening for the presence of diabetes:   <5.7%       Consistent with the absence of diabetes 5.7-6.4 %   Consistent with increased risk for diabetes (prediabetes) >=6.5 %     Consistent with diabetes   This assay result is consistent with a decreased risk of diabetes.   Currently, no consensus exists regarding use of hemoglobin A1c for diagnosis of diabetes in children.   According to American Diabetes Association (ADA) guidelines, hemoglobin A1c <7.0% represents optimal control in non-pregnant diabetic patients. Different metrics may apply to specific patient populations. Standards of Medical Care in Diabetes (ADA).       CBG: Recent Labs  Lab 03/14/19 0352 03/15/19 2033 03/16/19 0028 03/16/19 0357 03/16/19 0817  GLUCAP 123* 82 96 91 99    Nyra MarketGorica Yadiel Aubry, MD PGY3 Pager [phone]

## 2019-03-16 NOTE — Progress Notes (Signed)
Patient ID: Joel Rogers, male   DOB: 04-10-1951, 68 y.o.   MRN: [id number]     PCP-Cardiologist: Quay Burow, MD   Subjective:    Patient remains intubated and unresponsive, myoclonus suppressed by Versed and Keppra.  He was not cooled initially due to significant myoclonus.   BP stable, not on pressors.  CT head with no acute findings on 4/21. MRI head 4/22 showed severe anoxic injury.  Tm 100.9, blood cultures remain negative.   Echo (03/12/2019): EF >65%, moderate LVH, mildly decreased RV systolic function, bioprosthetic AoV s/p TAVR with mean gradient 44 mmHg and AVA 0.56 cm^2, this suggests severe bioprosthetic stenosis and is markedly different compared to 3/20 echo (mean gradient 14 mmHg) => suggest valve thrombosis versus endocarditis with obstruction (valve not well-visualized).   EEG: Severe background attenuation with myoclonus.    Objective:   Weight Range: 111.8 kg Body mass index is 32.52 kg/m.   Vital Signs:   Temp:  [98.2 F (36.8 C)-101.1 F (38.4 C)] 98.2 F (36.8 C) (04/24 0815) Pulse Rate:  [60-92] 61 (04/24 0712) Resp:  [15-22] 16 (04/24 0712) BP: (108-126)/(62-83) 117/73 (04/24 0712) SpO2:  [100 %] 100 % (04/24 0713) FiO2 (%):  [40 %] 40 % (04/24 0713) Weight:  [111.8 kg] 111.8 kg (04/24 0409) Last BM Date: 03/18/2019  Weight change: Filed Weights   03/14/19 0500 03/15/19 0405 03/16/19 0409  Weight: 103.6 kg 108.3 kg 111.8 kg    Intake/Output:   Intake/Output Summary (Last 24 hours) at 03/16/2019 0922 Last data filed at 03/16/2019 0700 Gross per 24 hour  Intake 2897.51 ml  Output 1065 ml  Net 1832.51 ml      Physical Exam    General: Intubated, unresponsive.  Neck: No JVD, no thyromegaly or thyroid nodule.  Lungs: Clear anteriorly. CV: Nondisplaced PMI.  Heart irregular S1/S2, no S3/S4, 2/6 SEM RUSB.  No peripheral edema.   Abdomen: Soft, nontender, no hepatosplenomegaly, no distention.  Skin: Intact without lesions or rashes.   Neurologic: Unresponsive Extremities: No clubbing or cyanosis.  HEENT: Normal.    Telemetry   Atrial fibrillation with rate in 80s (personally reviewed).   Labs    CBC Recent Labs    03/11/2019 1201  03/15/19 0355 03/16/19 0402  WBC 13.9*   < > 18.8* 17.8*  NEUTROABS 8.9*  --   --   --   HGB 9.0*   < > 9.1* 9.0*  HCT 30.8*   < > 27.7* 27.9*  MCV 85.1   < > 75.3* 76.9*  PLT 172   < > 214 188   < > = values in this interval not displayed.   Basic Metabolic Panel Recent Labs    03/14/19 0351 03/15/19 0355  NA 137 138  K 4.6 3.7  CL 108 111  CO2 15* 17*  GLUCOSE 130* 127*  BUN 17 25*  CREATININE 1.22 1.10  CALCIUM 8.5* 8.5*  MG 1.8 2.1  PHOS 3.9 3.3   Liver Function Tests Recent Labs    03/03/2019 1201 03/15/2019 1953 03/15/19 0355  AST 38 58*  --   ALT 21 33  --   ALKPHOS 120 120  --   BILITOT 0.7 1.1  --   PROT 6.6 6.6  --   ALBUMIN 2.2* 2.3* 2.2*   No results for input(s): LIPASE, AMYLASE in the last 72 hours. Cardiac Enzymes Recent Labs    03/16/2019 1543 03/08/2019 1828 03/14/19 0351  TROPONINI 0.22* 0.31* 0.30*    BNP:  BNP (last 3 results) Recent Labs    07/21/18 1019 02/03/19 0834 02/27/2019 1201  BNP 376.1* 408.1* 2,003.2*    ProBNP (last 3 results) No results for input(s): PROBNP in the last 8760 hours.   D-Dimer No results for input(s): DDIMER in the last 72 hours. Hemoglobin A1C No results for input(s): HGBA1C in the last 72 hours. Fasting Lipid Panel Recent Labs    03/14/19 0351  TRIG 46   Thyroid Function Tests No results for input(s): TSH, T4TOTAL, T3FREE, THYROIDAB in the last 72 hours.  Invalid input(s): FREET3  Other results:   Imaging    No results found.   Medications:     Scheduled Medications: . chlorhexidine gluconate (MEDLINE KIT)  15 mL Mouth Rinse BID  . feeding supplement (PRO-STAT SUGAR FREE 64)  60 mL Per Tube QID  . mouth rinse  15 mL Mouth Rinse 10 times per day  . pantoprazole (PROTONIX) IV   40 mg Intravenous QHS    Infusions: . sodium chloride 50 mL/hr at 03/16/19 0400  . ceFEPime (MAXIPIME) IV 2 g (03/16/19 0408)  . feeding supplement (VITAL AF 1.2 CAL) 25 mL/hr at 03/16/19 0700  . heparin 1,400 Units/hr (03/16/19 0700)  . levETIRAcetam 1,000 mg (03/16/19 0841)  . midazolam 3 mg/hr (03/16/19 0400)  . vancomycin Stopped (03/15/19 1530)    PRN Medications: acetaminophen, albuterol, insulin aspart, midazolam, ondansetron (ZOFRAN) IV    Patient Profile   Joel Rogers is a 68 y.o. male with a hx of severe AS with TAVR 9/19, Vfib arrest 3/20, NSTEMI 4/43, Chronic diastolic HF, chronic Afib, GIB, CVA 2/20 who is being seen for the evaluation of cardiac arrest at the request of Dr. Valeta Harms.  Assessment/Plan   1. Cardiac arrest: PEA, was in car on way home from CIR after long hospitalization.  Unresponsive in car x 10 minutes then had 10 minutes CPR in ER.  ROSC after 2 doses of epinephrine, no defibrillation.  ?Bioprosthetic valve thrombosis as trigger.  2. Acute hypoxemic respiratory failure: Intubated in ER. CXR with patchy infiltrates LUL and RLL that may be related to aspiration.  Per CCM.  3. CAD: NSTEMI 3/20 with 99% thrombotic stenosis in branch of ramus, medical management.  TnI only mildly elevated (maximal 0.3) this admission, suspect demand ischemia with hypotension in setting of cardiac arrest.  4. Bioprosthetic aortic valve stenosis: Echo reviewed, there has been significant elevation in mean gradient across the aortic valve to 49 mmHg since last echo in 3/20 (mean gradient 14 mmHg at that time).  I am concerned that bioprosthetic valve thrombosis versus endocarditis with obstruction could have led to PEA arrest (unfortunately, valve not well-visualized).  He was on Eliquis prior to admission and it appears that he was getting all his doses. Blood cultures negative so far, WBCs elevated at 17.8, Tm 100.9.  - Continue vancomycin/cefepime.   - Would need further workup  with TEE, etc, but at this point, with minimal neurologic recovery, would hold off on further imaging. - Continues on heparin gtt.  5. Atrial fibrillation: Chronic, rate is controlled. Has h/o CVA (while off anticoagulation for prior traumatic SDH).  - Heparin gtt as above.  6. Chronic diastolic CHF: Stable, not markedly volume overloaded.   7. Neuro: Suspect anoxic encephalopathy.  Not responsive, significant myoclonus before Versed started.  MRI head with severe anoxic changes.  Neurology following, prognosis poor.   - Plan for transition to comfort care this morning it appears.   Cardiology will sign  off given plan to transition to comfort care.    Length of Stay: 3  Loralie Champagne, MD  03/16/2019, 9:22 AM  Advanced Heart Failure Team Pager [phone] (M-F; 7a - 4p)  Please contact [facility] for night-coverage after hours (4p -7a ) and weekends on [email or web address]

## 2019-03-17 ENCOUNTER — Encounter (HOSPITAL_COMMUNITY): Payer: Self-pay

## 2019-03-17 LAB — GLUCOSE, CAPILLARY
Glucose-Capillary: 100 mg/dL — ABNORMAL HIGH (ref 70–99)
Glucose-Capillary: 106 mg/dL — ABNORMAL HIGH (ref 70–99)
Glucose-Capillary: 110 mg/dL — ABNORMAL HIGH (ref 70–99)
Glucose-Capillary: 113 mg/dL — ABNORMAL HIGH (ref 70–99)

## 2019-03-17 LAB — APTT: aPTT: 108 seconds — ABNORMAL HIGH (ref 24–36)

## 2019-03-17 LAB — HEPARIN LEVEL (UNFRACTIONATED): Heparin Unfractionated: 0.51 IU/mL (ref 0.30–0.70)

## 2019-03-17 NOTE — Progress Notes (Signed)
ANTICOAGULATION CONSULT NOTE   Pharmacy Consult for Heparin Indication: VTE  Allergies  Allergen Reactions  . Kayexalate [Polystyrene] Other (See Comments)    Recommended to avoid by GI due to ischemic gastropathy    Patient Measurements: Height: 6\' 1"  (185.4 cm) Weight: 243 lb 13.3 oz (110.6 kg) IBW/kg (Calculated) : 79.9 Heparin Dosing Weight: 106kg  Vital Signs: Temp: 97.3 F (36.3 C) (04/25 0600) Temp Source: Core (04/25 0100) BP: 120/70 (04/25 0722) Pulse Rate: 60 (04/25 0722)  Labs: Recent Labs    03/14/19 1051  03/15/19 0355 03/16/19 0402 03/16/19 0829 03/17/19 0230  HGB 8.9*  --  9.1* 9.0*  --   --   HCT 27.2*  --  27.7* 27.9*  --   --   PLT 191  --  214 188  --   --   APTT  --    < > 95* 74*  --  108*  HEPARINUNFRC  --    < > 1.04* 0.52  --  0.51  CREATININE  --   --  1.10  --  0.94  --    < > = values in this interval not displayed.    Estimated Creatinine Clearance: 98.1 mL/min (by C-G formula based on SCr of 0.94 mg/dL).   Medical History: Past Medical History:  Diagnosis Date  . Anemia    low iron  . Arthritis    bilateral knees  . Atrial fibrillation, chronic   . Chronic diastolic CHF (congestive heart failure) (HCC) 06/15/2018  . History of subdural hematoma   . Hyperlipidemia   . Hypertension   . Morbid obesity (HCC)   . Myocardial infarction (HCC)   . Normal coronary arteries    by cardiac catheterization performed 03/14/06  . Pre-diabetes    pt denies  . S/P TAVR (transcatheter aortic valve replacement)    a. 07/25/18: Edwards Sapien 3 THV (size 26 mm, model # 9600TFX, serial # [id number]) by Dr. Laneta Simmers and Dr. Clifton James  . Sleep apnea    on CPAP  . Stroke (HCC)   . Venous insufficiency   . VF (ventricular fibrillation) (HCC) causing cardiac arrest 02/03/2019    Assessment: 68 yoM on apixaban for hx PAF discharged from hospital 4/21 and subsequently had cardiopulmonary arrest, suspected cause is valve thrombosis. Pharmacy asked to  start IV heparin. Last Eliquis dose 4/21 at 0850. Heparin infusion originally ordered to start last night but held due to hematuria, started 4/22 AM.  APTT above goal at 108s, heparin level appears to have plateaued and at goal with last apixaban dose on 4/21. No CBC. No active bleed issues documented.   Overall plans to withdraw care this weekend, will reduce heparin rate this am to avoid levels trending too high and will stop additional monitoring.   Goal of Therapy:  Heparin level 0.3-0.7 units/ml aPTT 66-102 seconds Monitor platelets by anticoagulation protocol: Yes   Plan:  Reduce IV heparin infusion to 1200 units/hr  Will stop lab monitoring  Sheppard Coil PharmD., BCPS Clinical Pharmacist 03/17/2019 7:39 AM

## 2019-03-17 NOTE — Plan of Care (Signed)

## 2019-03-17 NOTE — Progress Notes (Signed)
NAME:  Joel Rogers, MRN:  [id number], DOB:  1951-02-12, LOS: 4 ADMISSION DATE:  03/10/2019, CONSULTATION DATE:  03/10/2019 REFERRING MD: Criss Alvine, EM  , CHIEF COMPLAINT:  Cardiac arrest   Brief History   68 yo M discharged today from rehab, became unresponsive while being driven home for about 10 minutes until arriving to ED. At ED found to be pulseless, unresponsive. ACLS initiated, ROSC after approx 10 minutes. Now intubated. CT Head no acute process.   Past Medical History   Anemia     low iron  . Arthritis    bilateral knees  . Atrial fibrillation, chronic   . Chronic diastolic CHF (congestive heart failure) (HCC) 06/15/2018  . History of subdural hematoma   . Hyperlipidemia   . Hypertension   . Morbid obesity (HCC)   . Normal coronary arteries    by cardiac catheterization performed 03/14/06  . Pre-diabetes    pt denies  . S/P TAVR (transcatheter aortic valve replacement)    a. 07/25/18: Edwards Sapien 3 THV (size 26 mm, model # 9600TFX, serial # [id number]) by Dr. Laneta Simmers and Dr. Clifton James  . Sleep apnea    on CPAP  . Venous insufficiency     Significant Hospital Events   4/21> 10 minutes ACLS with ROSC. Intubated. CT Head no acute process. Admit to ICU 4/22> MRI showing diffuse anoxic brain injury   Consults:  PCCM  Neuro Cardiology  Procedures:  ETT 4/21>   Significant Diagnostic Tests:  CT Head 4/21> no acute abnormality. Prior SDH drainage on right.  TTE 4/21 >  EF >65% and no WMA, small pericardial effusion, otherwise no changes. Biatrial dilation. MRI brain 4/22> severe anoxic brain injury  Micro Data:  4/21 BCx> no growth to date 4/21 Tracheal aspirate > not done  Antimicrobials:  4/21 vanc/cefepime> plan to stop 4/24  Interim history/subjective:  Currently off sedation.  Reportedly the family is gathering for possible transition to comfort care  Objective   Blood pressure 129/89, pulse 69, temperature 98.1 F (36.7 C), resp. rate  16, height  (1.854 m), weight 110.6 kg, SpO2 100 %.    Vent Mode: PRVC FiO2 (%):  [40 %] 40 % Set Rate:  [16 bmp] 16 bmp Vt Set:  [630 mL] 630 mL PEEP:  [5 cmH20] 5 cmH20 Plateau Pressure:  [18 cmH20-23 cmH20] 21 cmH20   Intake/Output Summary (Last 24 hours) at 03/17/2019 1125 Last data filed at 03/17/2019 0900 Gross per 24 hour  Intake 2104.46 ml  Output 1270 ml  Net 834.46 ml   Filed Weights   03/15/19 0405 03/16/19 0409 03/17/19 0300  Weight: 108.3 kg 111.8 kg 110.6 kg   Physical Exam: General: Appearing male who is unresponsive HEENT: Conjugate gaze is appreciated Neuro: No response to verbal stimuli CV: Sounds are distant PULM: Creased breath sounds in the bases GI: Soft faint bowel sounds Extremities: warm/dry, 2+ edema  Skin: no rashes or lesions   Resolved Hospital Problem list     Assessment & Plan:  [name] is a 68 year old male with multiple comorbidities who became unresponsive on the day of discharge from rehab.  Wife brought him to the ED and he was found in cardiac arrest.  Patient was pulled from car and CPR started.  Approximate downtime was 10 minutes in the car and 10 minutes in the ED, epi x2 in ED.  Interval development of full-body myoclonus concerning for anoxic brain injury, with MRI brain 4/22 confirming severe anoxic brain  injury. After discussing with family, he has been made DNR and plan is to transition to comfort care with terminal extubation later today.  S/p Cardiac Arrest Atrial fibrillation S/p TAVR CAD with lateral ramus lesion stenosis (99%) HTN Received 10 minute CPR, 2x epi with rosc.prior history of VFib arrest.  Peak troponin 0.31. TTE with normal EF and no WMA, small pericardial effusion and severe bioprosthetic stenosis compared to recent echo in March. Per cardiology this suggests valve thrombosis vs endocarditis with obstruction and could have been the inciting incident for cardiac arrest. Cruciate cardiology's help  Heparin drip Monitoring Holding antihypertensive agent  Acute respiratory failure  Secondary to cardiac arrest Vent bundle  Acute encephalopathy History of recent embolic CVA Severe anoxic brain injury with myoclonus CT H 4/21 negative for acute process; MRI brain 4/22 showing severe anoxic brain injury. Continue PAD protocol has not been given sedation for multiple hours at this time. CT with severe anoxic injury  Anion gap acidosis secondary to lactic acidosis Continue to monitor  History of Strep bacteremia/cellulitis S/p abx treatment Monitor culture data Continue current antimicrobial therapy  Global: Discussed with family 4/22 and 4/23 the overall poor prognosis. He has been made DNR; plan to transition to comfort care 4/24. Continue current management until then with no escalation of care.  03/17/2019 reportedly his family is getting together to make a decision concerning comfort care.  Best practice:  Diet: NPO Pain/Anxiety/Delirium protocol (if indicated): Versed gtt. PRN fentanyl/versed  VAP protocol (if indicated): yes  DVT prophylaxis: heparin GI prophylaxis: Protonix  Glucose control: SSI  Mobility: bedrest  Code Status: full  Family Communication: called wife on 4/23; family coming in today.  No family contact on 03/17/2019 at this time.  Waiting for family to gather. Disposition: ICU, plan to transition to comfort care later this morning  Labs   CBC: Recent Labs  Lab Jul 21, 2019 1201 Jul 21, 2019 1325 Jul 21, 2019 2033 03/14/19 1051 03/15/19 0355 03/16/19 0402  WBC 13.9*  --   --  17.5* 18.8* 17.8*  NEUTROABS 8.9*  --   --   --   --   --   HGB 9.0* 8.5* 8.5* 8.9* 9.1* 9.0*  HCT 30.8* 25.0* 25.0* 27.2* 27.7* 27.9*  MCV 85.1  --   --  75.6* 75.3* 76.9*  PLT 172  --   --  191 214 188    Basic Metabolic Panel: Recent Labs  Lab Jul 21, 2019 1201  Jul 21, 2019 1543 Jul 21, 2019 1953 Jul 21, 2019 2033 03/14/19 0351 03/15/19 0355 03/16/19 0829  NA 136   < >  --  137 137  137 138 140  K 5.0   < >  --  4.4 4.3 4.6 3.7 5.8*  CL 108  --   --  110  --  108 111 118*  CO2 10*  --   --  17*  --  15* 17* 17*  GLUCOSE 198*  --   --  150*  --  130* 127* 102*  BUN 12  --   --  17  --  17 25* 30*  CREATININE 1.12  --   --  1.26*  --  1.22 1.10 0.94  CALCIUM 8.9  --   --  8.4*  --  8.5* 8.5* 8.1*  MG  --   --  1.9 1.9  --  1.8 2.1 2.0  PHOS  --   --  4.2 4.1  --  3.9 3.3 3.0   < > = values in this interval not displayed.  GFR: Estimated Creatinine Clearance: 98.1 mL/min (by C-G formula based on SCr of 0.94 mg/dL). Recent Labs  Lab Mar 27, 2019 1201 03/27/2019 1543 Mar 27, 2019 1828 03/14/19 1051 03/15/19 0355 03/16/19 0402  PROCALCITON  --  0.60  --   --   --   --   WBC 13.9*  --   --  17.5* 18.8* 17.8*  LATICACIDVEN  --  3.0* 3.4* 2.9*  --   --     Liver Function Tests: Recent Labs  Lab 03/27/19 1201 03-27-19 1953 03/15/19 0355 03/16/19 0829  AST 38 58*  --   --   ALT 21 33  --   --   ALKPHOS 120 120  --   --   BILITOT 0.7 1.1  --   --   PROT 6.6 6.6  --   --   ALBUMIN 2.2* 2.3* 2.2* 1.8*   No results for input(s): LIPASE, AMYLASE in the last 168 hours. No results for input(s): AMMONIA in the last 168 hours.  ABG    Component Value Date/Time   PHART 7.440 03-27-19 2033   PCO2ART 24.2 (L) 2019-03-27 2033   PO2ART 118.0 (H) Mar 27, 2019 2033   HCO3 16.4 (L) 03/27/2019 2033   TCO2 17 (L) 03/27/19 2033   ACIDBASEDEF 7.0 (H) Mar 27, 2019 2033   O2SAT 99.0 03/27/2019 2033     Coagulation Profile: Recent Labs  Lab 03/27/2019 1201 Mar 27, 2019 1953  INR 1.9* 1.5*    Cardiac Enzymes: Recent Labs  Lab 03/27/19 1201 03-27-19 1543 2019-03-27 1828 03/14/19 0351  TROPONINI 0.06* 0.22* 0.31* 0.30*    HbA1C: Hemoglobin A1C  Date/Time Value Ref Range Status  01/22/2016 6.2  Final   Hgb A1c MFr Bld  Date/Time Value Ref Range Status  07/21/2018 10:20 AM 5.8 (H) 4.8 - 5.6 % Final    Comment:    (NOTE) Pre diabetes:          5.7%-6.4% Diabetes:               >6.4% Glycemic control for   <7.0% adults with diabetes   11/26/2016 02:06 PM 5.6 <5.7 % Final    Comment:      For the purpose of screening for the presence of diabetes:   <5.7%       Consistent with the absence of diabetes 5.7-6.4 %   Consistent with increased risk for diabetes (prediabetes) >=6.5 %     Consistent with diabetes   This assay result is consistent with a decreased risk of diabetes.   Currently, no consensus exists regarding use of hemoglobin A1c for diagnosis of diabetes in children.   According to American Diabetes Association (ADA) guidelines, hemoglobin A1c <7.0% represents optimal control in non-pregnant diabetic patients. Different metrics may apply to specific patient populations. Standards of Medical Care in Diabetes (ADA).       CBG: Recent Labs  Lab 03/16/19 1526 03/16/19 1952 03/16/19 2332 03/17/19 0336 03/17/19 0756  GLUCAP 103* 106* 96 110* 113*   App CCT 34 min   Brett Canales Minor ACNP [facility] PCC[facility] Pager [phone] till 1 pm If no answer page [phone] 03/17/2019, 11:25 AM

## 2019-03-18 ENCOUNTER — Encounter (HOSPITAL_COMMUNITY): Payer: Self-pay

## 2019-03-18 ENCOUNTER — Inpatient Hospital Stay (HOSPITAL_COMMUNITY): Payer: Medicare Other

## 2019-03-18 LAB — GLUCOSE, CAPILLARY
Glucose-Capillary: 110 mg/dL — ABNORMAL HIGH (ref 70–99)
Glucose-Capillary: 106 mg/dL — ABNORMAL HIGH (ref 70–99)
Glucose-Capillary: 102 mg/dL — ABNORMAL HIGH (ref 70–99)
Glucose-Capillary: 88 mg/dL (ref 70–99)

## 2019-03-18 LAB — BASIC METABOLIC PANEL
Anion gap: 7 (ref 5–15)
BUN: 24 mg/dL — ABNORMAL HIGH (ref 8–23)
CO2: 17 mmol/L — ABNORMAL LOW (ref 22–32)
Calcium: 8.5 mg/dL — ABNORMAL LOW (ref 8.9–10.3)
Chloride: 118 mmol/L — ABNORMAL HIGH (ref 98–111)
Creatinine, Ser: 0.81 mg/dL (ref 0.61–1.24)
GFR calc Af Amer: >60 mL/min (ref >60)
GFR calc non Af Amer: >60 mL/min (ref >60)
Glucose, Bld: 103 mg/dL — ABNORMAL HIGH (ref 70–99)
Potassium: 3.8 mmol/L (ref 3.5–5.1)
Sodium: 142 mmol/L (ref 135–145)

## 2019-03-18 LAB — CULTURE, BLOOD (ROUTINE X 2)
Culture: NO GROWTH
Culture: NO GROWTH
Special Requests: ADEQUATE

## 2019-03-18 LAB — MAGNESIUM: Magnesium: 2 mg/dL (ref 1.7–2.4)

## 2019-03-18 LAB — PHOSPHORUS: Phosphorus: 3 mg/dL (ref 2.5–4.6)

## 2019-03-18 MED ORDER — GLYCOPYRROLATE 1 MG PO TABS
1.0000 mg | ORAL_TABLET | ORAL | Status: DC | PRN
  Filled 2019-03-18: qty 1

## 2019-03-18 MED ORDER — MORPHINE BOLUS VIA INFUSION
5.0000 mg | INTRAVENOUS | Status: DC | PRN
  Administered 2019-03-19 – 2019-03-21 (×2): 5 mg via INTRAVENOUS
  Filled 2019-03-18: qty 5

## 2019-03-18 MED ORDER — MORPHINE 100MG IN NS 100ML (1MG/ML) PREMIX INFUSION
0.0000 mg/h | INTRAVENOUS | Status: DC
  Administered 2019-03-18 – 2019-03-19 (×2): 5 mg/h via INTRAVENOUS
  Administered 2019-03-19 (×2): 8 mg/h via INTRAVENOUS
  Administered 2019-03-20: 10 mg/h via INTRAVENOUS
  Administered 2019-03-20: 8 mg/h via INTRAVENOUS
  Administered 2019-03-21: 09:00:00 11 mg/h via INTRAVENOUS
  Filled 2019-03-18 (×6): qty 100

## 2019-03-18 MED ORDER — DEXTROSE 5 % IV SOLN
INTRAVENOUS | Status: DC
  Administered 2019-03-18: 12:00:00 via INTRAVENOUS

## 2019-03-18 MED ORDER — ACETAMINOPHEN 325 MG PO TABS: 650.0000 mg | ORAL_TABLET | Freq: Four times a day (QID) | ORAL | Status: DC | PRN

## 2019-03-18 MED ORDER — GLYCOPYRROLATE 0.2 MG/ML IJ SOLN
0.2000 mg | INTRAMUSCULAR | Status: DC | PRN
  Administered 2019-03-19 – 2019-03-21 (×7): 0.2 mg via INTRAVENOUS
  Filled 2019-03-18 (×6): qty 1

## 2019-03-18 MED ORDER — DIPHENHYDRAMINE HCL 50 MG/ML IJ SOLN: 25.0000 mg | INTRAMUSCULAR | Status: DC | PRN

## 2019-03-18 MED ORDER — ACETAMINOPHEN 650 MG RE SUPP: 650.0000 mg | Freq: Four times a day (QID) | RECTAL | Status: DC | PRN

## 2019-03-18 MED ORDER — POLYVINYL ALCOHOL 1.4 % OP SOLN
1.0000 [drp] | Freq: Four times a day (QID) | OPHTHALMIC | Status: DC | PRN
  Filled 2019-03-18: qty 15

## 2019-03-18 MED ORDER — GLYCOPYRROLATE 0.2 MG/ML IJ SOLN
0.2000 mg | INTRAMUSCULAR | Status: DC | PRN
  Filled 2019-03-18: qty 1

## 2019-03-18 MED ORDER — MORPHINE SULFATE (PF) 2 MG/ML IV SOLN: 2.0000 mg | INTRAVENOUS | Status: DC | PRN

## 2019-03-18 NOTE — Plan of Care (Signed)

## 2019-03-18 NOTE — Progress Notes (Signed)
Patient transferred to [facility] [place]. Wife, Arael Kolterman, was called and informed of the transfer. I also provided her with the phone number to [facility] [place] and informed her that the [facility] [place] RN will call when he passes, per her wishes. She inquired into his status, to which I told her his vitals were stable when transferred and he is being made comfortable on a morphine drip. She had no further questions.

## 2019-03-18 NOTE — Progress Notes (Addendum)
NAME:  Joel Rogers, MRN:  [id number], DOB:  01/15/1951, LOS: 5 ADMISSION DATE:  03/16/2019, CONSULTATION DATE:  03/11/2019 REFERRING MD: Criss Alvine, EM  , CHIEF COMPLAINT:  Cardiac arrest   Brief History   68 yo M discharged today from rehab, became unresponsive while being driven home for about 10 minutes until arriving to ED. At ED found to be pulseless, unresponsive. ACLS initiated, ROSC after approx 10 minutes. Now intubated. CT Head no acute process.   Past Medical History   Anemia     low iron  . Arthritis    bilateral knees  . Atrial fibrillation, chronic   . Chronic diastolic CHF (congestive heart failure) (HCC) 06/15/2018  . History of subdural hematoma   . Hyperlipidemia   . Hypertension   . Morbid obesity (HCC)   . Normal coronary arteries    by cardiac catheterization performed 03/14/06  . Pre-diabetes    pt denies  . S/P TAVR (transcatheter aortic valve replacement)    a. 07/25/18: Edwards Sapien 3 THV (size 26 mm, model # 9600TFX, serial # [id number]) by Dr. Laneta Simmers and Dr. Clifton James  . Sleep apnea    on CPAP  . Venous insufficiency     Significant Hospital Events   4/21> 10 minutes ACLS with ROSC. Intubated. CT Head no acute process. Admit to ICU 4/22> MRI showing diffuse anoxic brain injury   Consults:  PCCM  Neuro Cardiology  Procedures:  ETT 4/21>   Significant Diagnostic Tests:  CT Head 4/21> no acute abnormality. Prior SDH drainage on right.  TTE 4/21 >  EF >65% and no WMA, small pericardial effusion, otherwise no changes. Biatrial dilation. MRI brain 4/22> severe anoxic brain injury  Micro Data:  4/21 BCx> 4/21 Tracheal aspirate >   Antimicrobials:  4/21 vanc/cefepime> 4/24 4/24 ceftriaxone>4/27  Interim history/subjective:  Has been off of versed since 4/24; report of intermittent myoclonic activity overnight while moving patient.   Objective   Blood pressure (!) 157/103, pulse 82, temperature (!) 97.3 F (36.3 C),  temperature source Core, resp. rate 16, height 6\' 1"  (1.854 m), weight 114.6 kg, SpO2 100 %.    Vent Mode: PRVC FiO2 (%):  [40 %] 40 % Set Rate:  [16 bmp] 16 bmp Vt Set:  [630 mL] 630 mL PEEP:  [5 cmH20] 5 cmH20 Plateau Pressure:  [18 cmH20-21 cmH20] 18 cmH20   Intake/Output Summary (Last 24 hours) at 03/18/2019 0839 Last data filed at 03/18/2019 0800 Gross per 24 hour  Intake 1949.59 ml  Output 1040 ml  Net 909.59 ml   Filed Weights   03/16/19 0409 03/17/19 0300 03/18/19 0515  Weight: 111.8 kg 110.6 kg 114.6 kg   Physical Exam: General: Chronically ill-appearing, sedated HENT: [place], AT, ETT in place Eyes: EOMI, no scleral icterus Respiratory: Clear to auscultation bilaterally. No crackles, wheezing or rales Cardiovascular: Irregular rhythm, murmur present, no JVD GI: decreased bowel sounds, soft, nontender Extremities: 1+ pitting lower extremity bilaterally R>L, induration and skin discoloration on RLE Neuro: Sedated, does not respond to pain, 71mm pupils non reactive bilaterally GU: Foley in place  Resolved Hospital Problem list     Assessment & Plan:  [name] is a 68 year old male with multiple comorbidities who became unresponsive on the day of discharge from rehab.  Wife brought him to the ED and he was found in cardiac arrest.  Patient was pulled from car and CPR started.  Approximate downtime was 10 minutes in the car and 10 minutes in the  ED, epi x2 in ED.  Interval development of full-body myoclonus concerning for anoxic brain injury, with MRI brain 4/22 confirming severe anoxic brain injury. After discussing with family, he has been made DNR. Plan for family to come in today and possible transition to comfort care.  S/p Cardiac Arrest Atrial fibrillation S/p TAVR CAD with lateral ramus lesion stenosis (99%) HTN Received 10 minute CPR, 2x epi with rosc.prior history of VFib arrest.  Peak troponin 0.31. TTE with normal EF and no WMA, small pericardial effusion  and severe bioprosthetic stenosis compared to recent echo in March. Per cardiology this suggests valve thrombosis vs endocarditis with obstruction and could have been the inciting event for cardiac arrest. 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 RASS -5 despite no sedation -PAD protocol -continue keppra  Anion gap acidosis secondary to lactic acidosis Lactic acidosis likely related to recent tissue hypoperfusion; trended down.   ADDENDUM: Spoke with family again this morning. They do not plan on coming in. Wife states understanding of recommendation to transition to comfort care as poor neurologic prognosis. She agrees on transition to comfort care and terminal extubation later today; she does not plan on coming in and does not wish to have family at bedside or video in. She states understanding that patient will pass off of support. She wishes to be notified at time of patient's passing. She had no further questions.   Best practice:  Diet: NPO Pain/Anxiety/Delirium protocol (if indicated): PRN fentanyl/versed  VAP protocol (if indicated): yes  DVT prophylaxis: heparin GI prophylaxis: Protonix  Glucose control: SSI  Mobility: bedrest  Code Status: DNR Family Communication: family supposed to be coming in later today Disposition: ICU, transition to comfort with terminal extubation later today  Labs   CBC: Recent Labs  Lab 2019/05/29 1201 2019/05/29 1325 2019/05/29 2033 03/14/19 1051 03/15/19 0355 03/16/19 0402  WBC 13.9*  --   --  17.5* 18.8* 17.8*  NEUTROABS 8.9*  --   --   --   --   --   HGB 9.0* 8.5* 8.5* 8.9* 9.1* 9.0*  HCT 30.8* 25.0*  25.0* 27.2* 27.7* 27.9*  MCV 85.1  --   --  75.6* 75.3* 76.9*  PLT 172  --   --  191 214 188    Basic Metabolic Panel: Recent Labs  Lab 2019/05/29 1953 2019/05/29 2033 03/14/19 0351 03/15/19 0355 03/16/19 0829 03/18/19 0303  NA 137 137 137 138 140 142  K 4.4 4.3 4.6 3.7 5.8* 3.8  CL 110  --  108 111 118* 118*  CO2 17*  --  15* 17* 17* 17*  GLUCOSE 150*  --  130* 127* 102* 103*  BUN 17  --  17 25* 30* 24*  CREATININE 1.26*  --  1.22 1.10 0.94 0.81  CALCIUM 8.4*  --  8.5* 8.5* 8.1* 8.5*  MG 1.9  --  1.8 2.1 2.0 2.0  PHOS 4.1  --  3.9 3.3 3.0 3.0   GFR: Estimated Creatinine Clearance: 115.8 mL/min (by C-G formula based on SCr of 0.81 mg/dL). Recent Labs  Lab 2019/05/29 1201 2019/05/29 1543 2019/05/29 1828 03/14/19 1051 03/15/19 0355 03/16/19 0402  PROCALCITON  --  0.60  --   --   --   --  WBC 13.9*  --   --  17.5* 18.8* 17.8*  LATICACIDVEN  --  3.0* 3.4* 2.9*  --   --     Liver Function Tests: Recent Labs  Lab 2019/03/31 1201 03-31-19 1953 03/15/19 0355 03/16/19 0829  AST 38 58*  --   --   ALT 21 33  --   --   ALKPHOS 120 120  --   --   BILITOT 0.7 1.1  --   --   PROT 6.6 6.6  --   --   ALBUMIN 2.2* 2.3* 2.2* 1.8*   No results for input(s): LIPASE, AMYLASE in the last 168 hours. No results for input(s): AMMONIA in the last 168 hours.  ABG    Component Value Date/Time   PHART 7.440 03-31-2019 2033   PCO2ART 24.2 (L) 03/31/2019 2033   PO2ART 118.0 (H) 03/31/19 2033   HCO3 16.4 (L) 03-31-2019 2033   TCO2 17 (L) 2019-03-31 2033   ACIDBASEDEF 7.0 (H) Mar 31, 2019 2033   O2SAT 99.0 31-Mar-2019 2033     Coagulation Profile: Recent Labs  Lab 03/31/19 1201 03-31-2019 1953  INR 1.9* 1.5*    Cardiac Enzymes: Recent Labs  Lab 03/31/19 1201 Mar 31, 2019 1543 2019-03-31 1828 03/14/19 0351  TROPONINI 0.06* 0.22* 0.31* 0.30*    HbA1C: Hemoglobin A1C  Date/Time Value Ref Range Status  01/22/2016 6.2  Final   Hgb A1c MFr Bld  Date/Time Value Ref Range Status   07/21/2018 10:20 AM 5.8 (H) 4.8 - 5.6 % Final    Comment:    (NOTE) Pre diabetes:          5.7%-6.4% Diabetes:              >6.4% Glycemic control for   <7.0% adults with diabetes   11/26/2016 02:06 PM 5.6 <5.7 % Final    Comment:      For the purpose of screening for the presence of diabetes:   <5.7%       Consistent with the absence of diabetes 5.7-6.4 %   Consistent with increased risk for diabetes (prediabetes) >=6.5 %     Consistent with diabetes   This assay result is consistent with a decreased risk of diabetes.   Currently, no consensus exists regarding use of hemoglobin A1c for diagnosis of diabetes in children.   According to American Diabetes Association (ADA) guidelines, hemoglobin A1c <7.0% represents optimal control in non-pregnant diabetic patients. Different metrics may apply to specific patient populations. Standards of Medical Care in Diabetes (ADA).       CBG: Recent Labs  Lab 03/17/19 1624 03/17/19 1954 03/17/19 2354 03/18/19 0341 03/18/19 0754  GLUCAP 106* 106* 100* 102* 88    Nyra Market, MD PGY3 Pager [phone]

## 2019-03-18 NOTE — Procedures (Signed)
Extubation Procedure Note  Patient Details:   Name: Joel Rogers DOB: 01-20-1951 MRN: [id number]   Airway Documentation:    Vent end date: 03/18/19 Vent end time: 1122   Evaluation  O2 sats: stable throughout Complications: No apparent complications Patient did tolerate procedure well. Bilateral Breath Sounds: Clear, Diminished   No   Pt extubated to Room Air per Withdrawal of life protocol.   Ermalinda Barrios M 03/18/2019, 11:22 AM

## 2019-03-18 NOTE — Progress Notes (Signed)
RN called to speak with patient's wife per wife's request. Wife wanted to know if patient is breathing on his own. RN informed that patient is currently breathing on his own. Nursing will continue to monitor.

## 2019-03-19 NOTE — Progress Notes (Signed)
Nutrition Brief Note  Chart reviewed. Pt now transitioning to comfort care.  No further nutrition interventions warranted at this time.  Please re-consult as needed.   Neiman Roots A. Hannelore Bova, RD, LDN, CDCES Registered Dietitian II Certified Diabetes Care and Education Specialist Pager: [phone] After hours Pager: [phone]  

## 2019-03-19 NOTE — Consult Note (Signed)
   [facility] CM Inpatient Consult   03/19/2019  ALYSTER BINGEN 1951-01-25 [id number]    Patient reviewed for potential [facility] Care Management services due to unplanned readmissions and hospitalizations with risk score of31%, (extreme) as well as benefit from [employer]. Patient had been outreached in the past by [facility] Telephonic CM for EMMI HF follow-up.  Per chart review and MD note 03/18/19 reveal as follows: Mr. Aragon Gulden is a 68 year old male with multiple co-morbidities admitted for unresponsiveness on the day he was discharged from rehab (CIR, after long hospitalization), was found in cardiac arrest, intubated during code and admitted to ICU. MRI brain 4/22 showing severe anoxic brain injury.Wife had been made aware as per discussion with MD, of recommendation to transition to comfort care as poor neurologic prognosis and had agreed on transition to comfort care and terminal extubation 4/26. Wife does not plan on coming in, did not wish to have family at bedside or video in, understood that patient will pass off of support and wishes to be notified at time of patient's passing.  There are no identifiable [facility] care management needs at this point.  Will sign off.    For questions and additional information, please contact:  Deigo Alonso A. Megahn Killings, BSN, RN-BC [facility] Liaison Cell: [phone]

## 2019-03-19 NOTE — Progress Notes (Addendum)
NAME:  Joel Rogers, MRN:  [id number], DOB:  10/03/1951, LOS: 6 ADMISSION DATE:  03/11/2019, CONSULTATION DATE:  03/20/2019 REFERRING MD: Criss Alvine, EM  , CHIEF COMPLAINT:  Cardiac arrest   Brief History   68 yo M discharged today from rehab, became unresponsive while being driven home for about 10 minutes until arriving to ED. At ED found to be pulseless, unresponsive. ACLS initiated, ROSC after approx 10 minutes. Now intubated. CT Head no acute process.   Past Medical History   Anemia     low iron  . Arthritis    bilateral knees  . Atrial fibrillation, chronic   . Chronic diastolic CHF (congestive heart failure) (HCC) 06/15/2018  . History of subdural hematoma   . Hyperlipidemia   . Hypertension   . Morbid obesity (HCC)   . Normal coronary arteries    by cardiac catheterization performed 03/14/06  . Pre-diabetes    pt denies  . S/P TAVR (transcatheter aortic valve replacement)    a. 07/25/18: Edwards Sapien 3 THV (size 26 mm, model # 9600TFX, serial # [id number]) by Dr. Laneta Simmers and Dr. Clifton James  . Sleep apnea    on CPAP  . Venous insufficiency     Significant Hospital Events   4/21> 10 minutes ACLS with ROSC. Intubated. CT Head no acute process. Admit to ICU 4/22> MRI showing diffuse anoxic brain injury   Consults:  PCCM  Neuro Cardiology  Procedures:  ETT 4/21>   Significant Diagnostic Tests:  CT Head 4/21> no acute abnormality. Prior SDH drainage on right.  TTE 4/21 >  EF >65% and no WMA, small pericardial effusion, otherwise no changes. Biatrial dilation. MRI brain 4/22> severe anoxic brain injury  Micro Data:  4/21 BCx> 4/21 Tracheal aspirate >   Antimicrobials:  4/21 vanc/cefepime> 4/24 4/24 ceftriaxone>4/27  Interim history/subjective:  Transitioned to comfort care/ terminally extubated 4/26 Remains on morphine gtt at 5 mg/hr Remains unresponsive and agonal   Objective   Blood pressure 133/84, pulse 70, temperature 98.1 F (36.7 C),  resp. rate (!) 9, height 6\' 1"  (1.854 m), weight 114.6 kg, SpO2 97 %.        Intake/Output Summary (Last 24 hours) at 03/19/2019 1341 Last data filed at 03/19/2019 0900 Gross per 24 hour  Intake 224.89 ml  Output 200 ml  Net 24.89 ml   Filed Weights   03/16/19 0409 03/17/19 0300 03/18/19 0515  Weight: 111.8 kg 110.6 kg 114.6 kg   Physical Exam: General:  Ill appearing male in NAD HEENT: MM pale/ moist, copious oral secretions Neuro: unresponsive CV: irregular, weak radial pulses PULM: agonal RR:NHAFB in place  Extremities: generalized edema +1-2   Resolved Hospital Problem list     Assessment & Plan:  [name] is a 68 year old male with multiple comorbidities who became unresponsive on the day of discharge from rehab.  Wife brought him to the ED and he was found in cardiac arrest.  Patient was pulled from car and CPR started.  Approximate downtime was 10 minutes in the car and 10 minutes in the ED, epi x2 in ED.  Interval development of full-body myoclonus concerning for anoxic brain injury, with MRI brain 4/22 confirming severe anoxic brain injury. After discussing with family, he was made DNR. He was transitioned to comfort care and terminally extubated on 4/26.  S/p Cardiac Arrest Atrial fibrillation Acute respiratory failure  Severe anoxic brain injury with myoclonus P: Full comfort care Continue morphine gtt and bolus as needed per protocol  Prn benadryl, robinul, ativan  Suspect patient will pass soon given mental status and agonal breathing  4/26>> wife does not plan on coming in and does not wish to have family at bedside or video in.  Verbalized understanding of patient's terminal condition.  4/27 called and updated wife on patient's condition.  Asked to be called when he passes.   Posey BoyerBrooke Simpson, MSN, AGACNP-BC [facility] Pgr: [phone] or if no answer [phone] 03/19/2019, 1:50 PM   PCCM Attending:   68 yo M, s/p cardiac  arrest. On full comfort measures. Morphine continuous ggt. Patient appears comfortable. Care discussed with nursing at bedside.   Comfort is the only goal.  Joel IgoBradley L Iran Rowe, DO [facility] 03/19/2019 3:48 PM

## 2019-03-19 NOTE — Care Management Important Message (Signed)
Important Message  Patient Details  Name: Joel Rogers MRN: [id number] Date of Birth: 11-19-1951   Medicare Important Message Given:  Yes    Dorena Bodo 03/19/2019, 4:24 PM

## 2019-03-20 NOTE — Progress Notes (Addendum)
NAME:  Joel Rogers, MRN:  [id number], DOB:  January 01, 1951, LOS: 7 ADMISSION DATE:  04-01-19, CONSULTATION DATE:  04-01-19 REFERRING MD: Criss Alvine, EM  , CHIEF COMPLAINT:  Cardiac arrest   Brief History   68 yo M discharged today from rehab, became unresponsive while being driven home for about 10 minutes until arriving to ED. At ED found to be pulseless, unresponsive. ACLS initiated, ROSC after approx 10 minutes. Now intubated. CT Head no acute process.   Past Medical History   Anemia     low iron  . Arthritis    bilateral knees  . Atrial fibrillation, chronic   . Chronic diastolic CHF (congestive heart failure) (HCC) 06/15/2018  . History of subdural hematoma   . Hyperlipidemia   . Hypertension   . Morbid obesity (HCC)   . Normal coronary arteries    by cardiac catheterization performed 03/14/06  . Pre-diabetes    pt denies  . S/P TAVR (transcatheter aortic valve replacement)    a. 07/25/18: Edwards Sapien 3 THV (size 26 mm, model # 9600TFX, serial # [id number]) by Dr. Laneta Simmers and Dr. Clifton James  . Sleep apnea    on CPAP  . Venous insufficiency     Significant Hospital Events   4/21> 10 minutes ACLS with ROSC. Intubated. CT Head no acute process. Admit to ICU 4/22> MRI showing diffuse anoxic brain injury   Consults:  PCCM  Neuro Cardiology  Procedures:  ETT 4/21>   Significant Diagnostic Tests:  CT Head 4/21> no acute abnormality. Prior SDH drainage on right.  TTE 4/21 >  EF >65% and no WMA, small pericardial effusion, otherwise no changes. Biatrial dilation. MRI brain 4/22> severe anoxic brain injury  Micro Data:  4/21 BCx> 4/21 Tracheal aspirate >   Antimicrobials:  4/21 vanc/cefepime> 4/24 4/24 ceftriaxone>4/27  Interim history/subjective:  Transitioned to comfort care/ terminally extubated 4/26 Remains on morphine gtt at 8 mg/hr Remains unresponsive with  agonal respirations 4/28  Objective   Blood pressure 117/71, pulse 76, temperature  97.6 F (36.4 C), temperature source Axillary, resp. rate (!) 5, height 6\' 1"  (1.854 m), weight 114.6 kg, SpO2 96 %.        Intake/Output Summary (Last 24 hours) at 03/20/2019 1023 Last data filed at 03/20/2019 0441 Gross per 24 hour  Intake 129.14 ml  Output 650 ml  Net -520.86 ml   Filed Weights   03/16/19 0409 03/17/19 0300 03/18/19 0515  Weight: 111.8 kg 110.6 kg 114.6 kg   Physical Exam: General:  Ill appearing male in NAD, appears comfortable HEENT: MM pale/ moist, copious oral secretions Neuro: unresponsive, eyes open, no focus or track, no blink to threat CV: irregular, weak radial pulses PULM: Bilateral excursion, agonal AT:FTDDU in place  Extremities: generalized edema +1-2   Resolved Hospital Problem list     Assessment & Plan:  [name] is a 68 year old male with multiple comorbidities who became unresponsive on the day of discharge from rehab.  Wife brought him to the ED and he was found in cardiac arrest.  Patient was pulled from car and CPR started.  Approximate downtime was 10 minutes in the car and 10 minutes in the ED, epi x2 in ED.  Interval development of full-body myoclonus concerning for anoxic brain injury, with MRI brain 4/22 confirming severe anoxic brain injury. After discussing with family, he was made DNR. He was transitioned to comfort care and terminally extubated on 4/26.  S/p Cardiac Arrest Atrial fibrillation Acute respiratory failure  Severe anoxic brain injury with myoclonus P: Full comfort care Continue morphine gtt and bolus as needed per protocol Prn benadryl, robinul, ativan  Suspect patient will pass soon given mental status and agonal breathing  4/26>> wife does not plan on coming in and does not wish to have family at bedside or video in.  Verbalized understanding of patient's terminal condition.  4/27>> called and updated wife on patient's condition.  Asked to be called when he passes.  4/28>> Wife updated by nurse and is  considering coming in to visit. She will decide today.   Synia Douglass F. Jerney Baksh, , MSN, ABevelyn NgoGACNP-BC [facility] [phone]: [phone] or if no answer [phone] 03/20/2019, 10:23 AM

## 2019-03-21 DIAGNOSIS — Z515 Encounter for palliative care: Secondary | ICD-10-CM

## 2019-03-23 NOTE — Progress Notes (Signed)
NAME:  Joel Rogers, MRN:  [id number], DOB:  [birth year], LOS: 8 ADMISSION DATE:  03/18/2019, CONSULTATION DATE:  02/21/2019 REFERRING MD: Criss Alvine, EM  , CHIEF COMPLAINT:  Cardiac arrest   Brief History   68 yo M discharged today from rehab, became unresponsive while being driven home for about 10 minutes until arriving to ED. At ED found to be pulseless, unresponsive. ACLS initiated, ROSC after approx 10 minutes. Now intubated. CT Head no acute process.   Past Medical History   Anemia     low iron  . Arthritis    bilateral knees  . Atrial fibrillation, chronic   . Chronic diastolic CHF (congestive heart failure) (HCC) 06/15/2018  . History of subdural hematoma   . Hyperlipidemia   . Hypertension   . Morbid obesity (HCC)   . Normal coronary arteries    by cardiac catheterization performed 03/14/06  . Pre-diabetes    pt denies  . S/P TAVR (transcatheter aortic valve replacement)    a. 07/25/18: Edwards Sapien 3 THV (size 26 mm, model # 9600TFX, serial # [id number]) by Dr. Laneta Simmers and Dr. Clifton James  . Sleep apnea    on CPAP  . Venous insufficiency     Significant Hospital Events   4/21> 10 minutes ACLS with ROSC. Intubated. CT Head no acute process. Admit to ICU 4/22> MRI showing diffuse anoxic brain injury   Consults:  PCCM  Neuro Cardiology  Procedures:  ETT 4/21>   Significant Diagnostic Tests:  CT Head 4/21> no acute abnormality. Prior SDH drainage on right.  TTE 4/21 >  EF >65% and no WMA, small pericardial effusion, otherwise no changes. Biatrial dilation. MRI brain 4/22> severe anoxic brain injury  Micro Data:  4/21 BCx> 4/21 Tracheal aspirate >   Antimicrobials:  4/21 vanc/cefepime> 4/24 4/24 ceftriaxone>4/27  Interim history/subjective:  Transitioned to comfort care/ terminally extubated 4/26 Remains on morphine gtt at 11 mg/hr Remains unresponsive with  agonal respirations with periods of apnea  4/29  Objective   Blood pressure 133/72,  pulse 90, temperature 98.7 F (37.1 C), temperature source Oral, resp. rate (!) 5, height 6\' 1"  (1.854 m), weight 114.6 kg, SpO2 94 %.        Intake/Output Summary (Last 24 hours) at 2019-04-18 1048 Last data filed at Apr 18, 2019 0411 Gross per 24 hour  Intake -  Output 750 ml  Net -750 ml   Filed Weights   03/16/19 0409 03/17/19 0300 03/18/19 0515  Weight: 111.8 kg 110.6 kg 114.6 kg   Physical Exam: General:  Unresponsive elderly male , appears comfortable supine in bed HEENT: NCAT, MM pale/ moist, copious oral secretions Neuro: unresponsive, eyes open, no focus or track, no blink to threat CV: irregular per auscultation, , weak radial pulses PULM: Bilateral excursion, agonal respirations with periods of apnea UE:KCMKL in place  Extremities: generalized edema +1-2   Resolved Hospital Problem list     Assessment & Plan:  Mr. Cordarryl Ohm is a 68 year old male with multiple comorbidities who became unresponsive on the day of discharge from rehab.  Wife brought him to the ED and he was found in cardiac arrest.  Patient was pulled from car and CPR started.  Approximate downtime was 10 minutes in the car and 10 minutes in the ED, epi x2 in ED.  Interval development of full-body myoclonus concerning for anoxic brain injury, with MRI brain 4/22 confirming severe anoxic brain injury. After discussing with family, he was made DNR. He was transitioned to comfort care  and terminally extubated on 4/26.  S/p Cardiac Arrest Atrial fibrillation Acute respiratory failure  Severe anoxic brain injury with myoclonus P: Full comfort care Continue morphine gtt and bolus as needed per protocol Prn benadryl, robinul, ativan  Suspect patient will pass soon given mental status and agonal breathing  4/26>> wife does not plan on coming in and does not wish to have family at bedside or video in.  Verbalized understanding of patient's terminal condition.  4/27>> called and updated wife on patient's  condition.  Asked to be called when he passes.  4/28>> Wife updated by nurse and is considering coming in to visit. She will decide today.  4/29>> Morphine gtt increased to 11 mg per hour. Agonal respirations with periods of apnea. Robinul for secretions. Family updated by nursing by phone    Bevelyn NgoSarah F. Barbarajean Kinzler, , MSN, AGACNP-BC [facility] [phone]: [phone] or if no answer [phone] 01/16/19, 10:48 AM

## 2019-03-23 NOTE — Progress Notes (Addendum)
Patient expired. Notified Dr. Everardo All and Dr. Vassie Loll. Patient's wife Annice Pih notified. Patient's wife stated she would like to share this info with the family, also stated she does not want the patient's shoes that are at bedside. Patient will be transferred to the morgue and picked up by [facility] funeral home.   1300 Wasted 6mL Morphine drip with Shanon Brow, RN.  [phone] Patient transferred to morgue.

## 2019-03-23 DEATH — deceased

## 2019-04-04 ENCOUNTER — Telehealth: Payer: Self-pay

## 2019-04-04 NOTE — Telephone Encounter (Signed)
Received dc from [facility]   The dc was brought to [place] again because there was a problem with the first one that Doctor Vassie Loll signed.  DC will be taken to [facility] [address] for signature.  On 04/05/2019 Received signed dc back from Doctor Vassie Loll. I called funeral home to let them know dc was mailed to vital records per their request.

## 2019-04-05 ENCOUNTER — Telehealth: Payer: Self-pay

## 2019-04-05 NOTE — Telephone Encounter (Signed)
Note was entered by mistake. 

## 2019-04-23 NOTE — Discharge Summary (Signed)
NAME:  Joel Rogers, MRN:  [id number], DOB:  [birth year], LOS: 8 ADMISSION DATE:  Mar 18, 2019, CONSULTATION DATE:  03/18/19 REFERRING MD: Criss Alvine, EM  , CHIEF COMPLAINT:  Cardiac arrest   Brief History   68 y.o. male with history of severe aortic stenosis and TAVR 07/2018 & chronic AF on eliquis Initially presented to [facility]/13/2019 secondary to right upper extremity pain and recent fall with right sided weakness findings of sepsis due to Staphylococcus bacteremia and cellulitis during admission maintain on antibiotic therapy. Noted persistent right side weakness. MRI the brain revealed multiple acute ischemic infarcts in a pattern most consistent with cardioembolic stroke.TEE neg  Patient remained on eliquis for atrial fibrillation with the addition of aspirin for CVA prophylaxis. He developed coffee-ground emesis with acute blood loss anemia requiring transfusion totaling 6 units pack red blood cells with gastroenterology consulted for EGD completed 2 with findings of red blood in the lower third of the esophagus clotted blood in the entire stomach. Blood thinners were held and follow-up GI endoscopy again completed 01/26/2019 showing no active bleeding or blood present. Recommendations were to hold aspirin and eliquis for approximately 3-5 days and then resume. He was admitted to [facility] 3/10-3/14  On 3/14 he developed large non-STEMI troponin greater than 65 and V. fib cardiac arrest. cardiac catheterization same day showed  Lat Ramus lesion 99% stenosed. Heavy thrombus. No other significant CAD.  He was transferred back to rehab 3/23 -4/21   On 4/21 after discharge from rehab from rehab, became unresponsive while being driven home for about 10 minutes until arriving to ED. At ED found to be pulseless, unresponsive. ACLS initiated, ROSC after approx 10 minutes. Now intubated. CT Head no acute process.   Past Medical History   Anemia     low iron  . Arthritis    bilateral  knees  . Atrial fibrillation, chronic   . Chronic diastolic CHF (congestive heart failure) (HCC) 06/15/2018  . History of subdural hematoma   . Hyperlipidemia   . Hypertension   . Morbid obesity (HCC)   . Normal coronary arteries    by cardiac catheterization performed 03/14/06  . Pre-diabetes    pt denies  . S/P TAVR (transcatheter aortic valve replacement)    a. 07/25/18: Edwards Sapien 3 THV (size 26 mm, model # 9600TFX, serial # [id number]) by Dr. Laneta Simmers and Dr. Clifton James  . Sleep apnea    on CPAP  . Venous insufficiency     Significant Hospital Events   4/21> 10 minutes ACLS with ROSC. Intubated. CT Head no acute process. Admit to ICU 4/22> MRI showing diffuse anoxic brain injury   Consults:  PCCM  Neuro Cardiology  Procedures:  ETT 4/21>   Significant Diagnostic Tests:  CT Head 4/21> no acute abnormality. Prior SDH drainage on right.  TTE 4/21 >  EF >65% and no WMA, small pericardial effusion, otherwise no changes. Biatrial dilation. MRI brain 4/22> severe anoxic brain injury  Micro Data:  4/21 BCx>ng   Antimicrobials:  4/21 vanc/cefepime> 4/24 4/24 ceftriaxone>4/27  Assessment & Plan:  Joel Rogers is a 68 year old male with multiple comorbidities who became unresponsive on the day of discharge from rehab.  Wife brought him to the ED and he was found in cardiac arrest.  Patient was pulled from car and CPR started.  Approximate downtime was 10 minutes in the car and 10 minutes in the ED, epi x2 in ED.  Interval development of full-body myoclonus concerning for anoxic  brain injury, with MRI brain 4/22 confirming severe anoxic brain injury. After discussing with family, he was made DNR. He was transitioned to comfort care and terminally extubated on 4/26 and passed away on 4/29  Cause of death -anoxic encephalopathy post cardiac arrest,acute coronary syndrome, aortic stenosis, CVA      Joel Haywood V. Vassie LollAlva, DO [facility] 03/26/2019 9:45  AM

## 2020-02-23 IMAGING — CR DG CHEST 2V
2 series · 2 of 2 positions shown · non-contrast
Comparison: CT 07/11/2018.  Chest x-ray [REDACTED].

CLINICAL DATA: Pre-admission for heart surgery.

EXAM:
CHEST - 2 VIEW

[w chest pa]
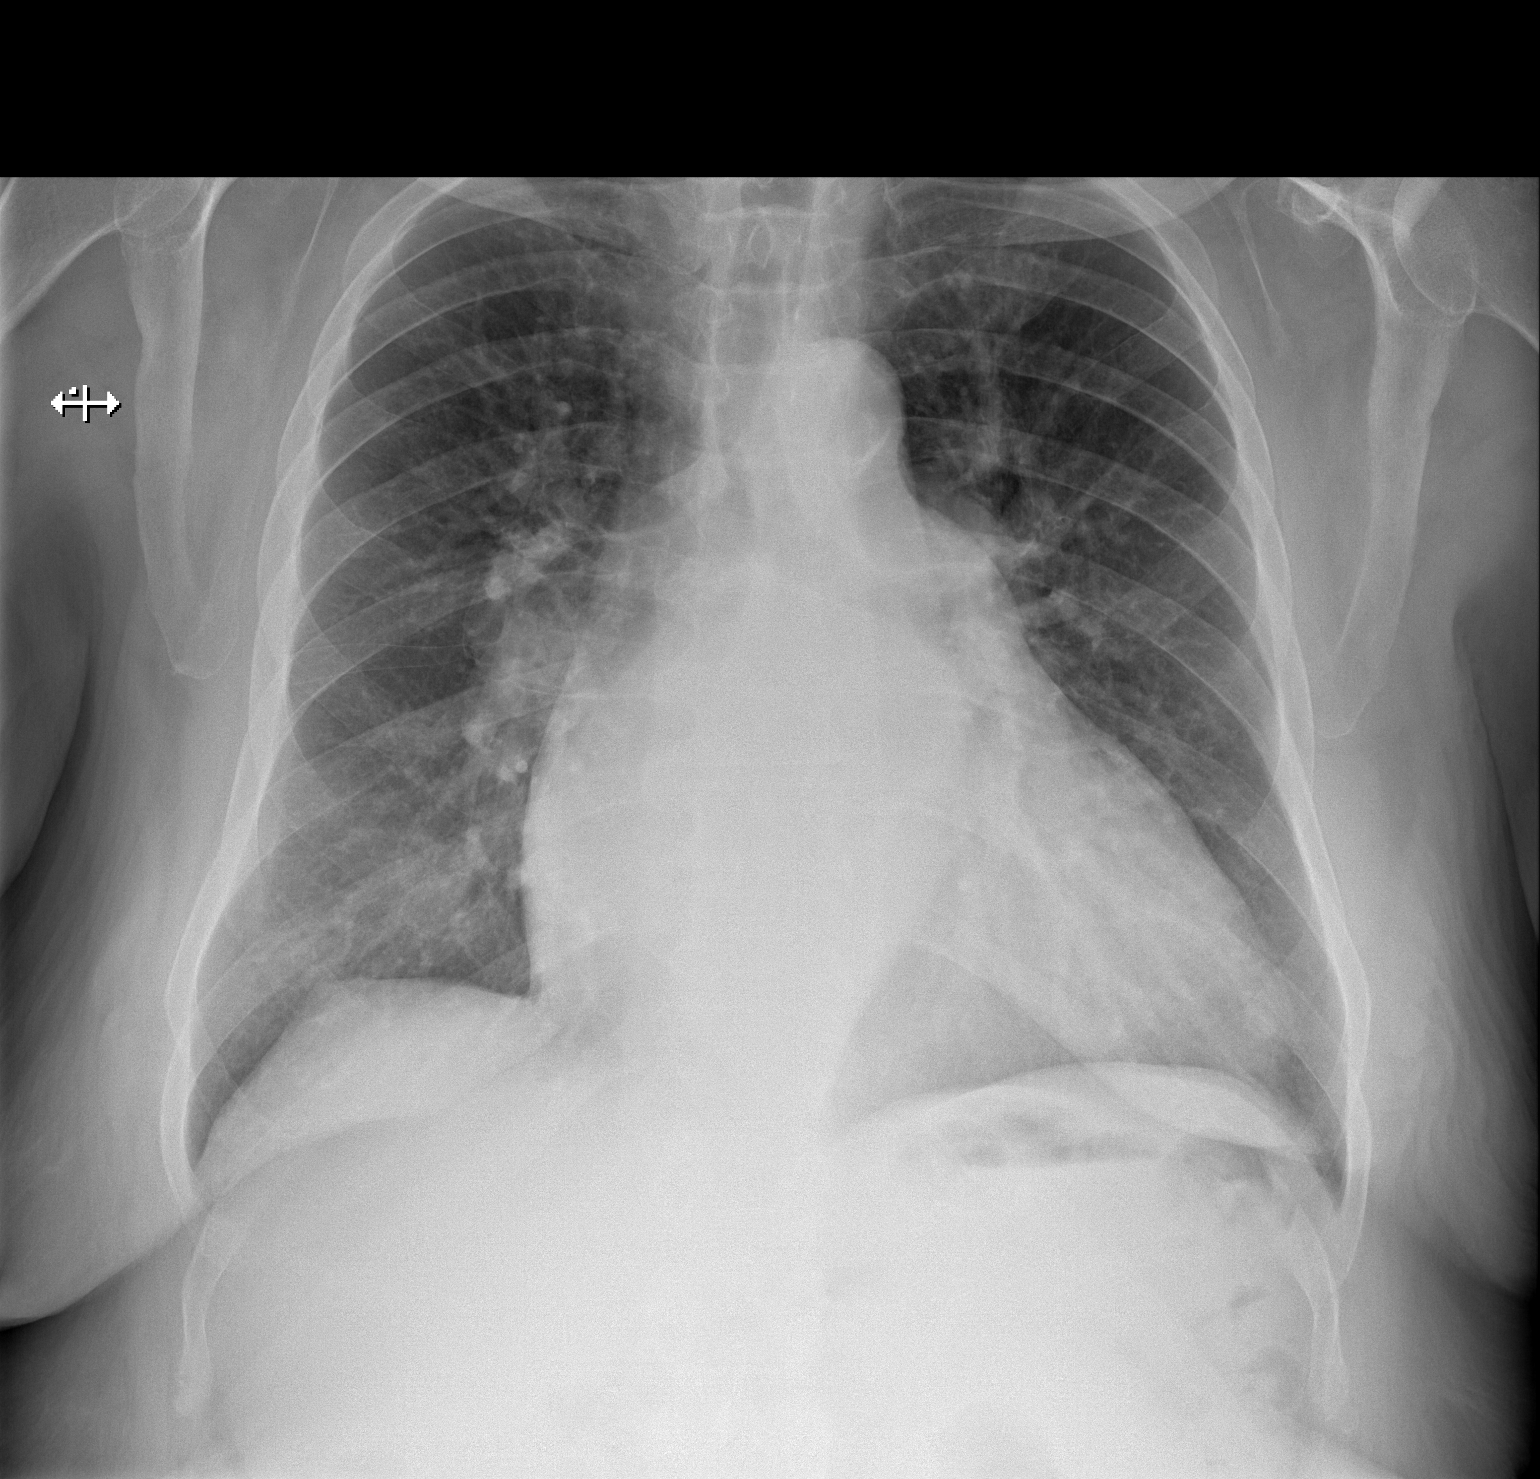

[w chest lat]
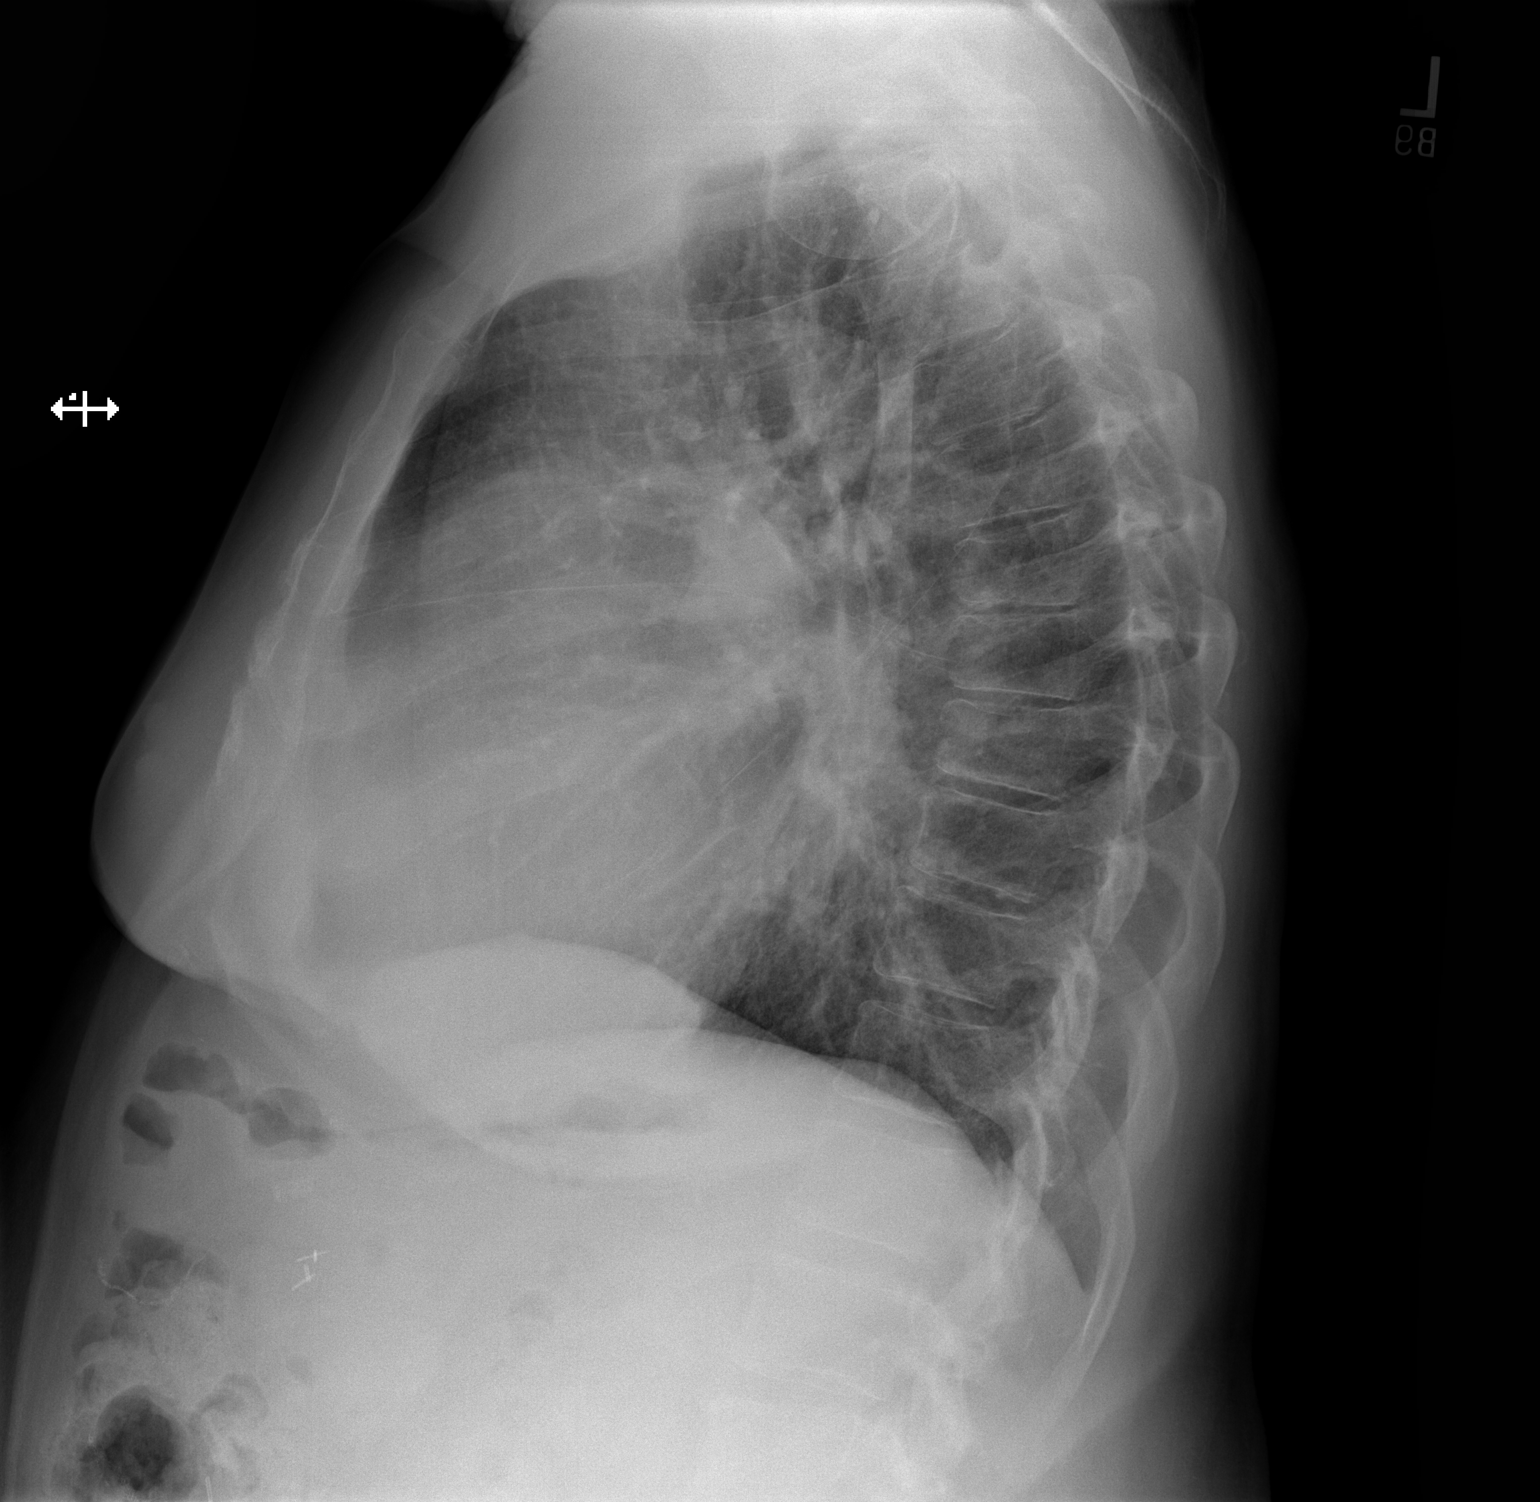

[2 of 2 positions shown; findings below may reference images not displayed]

FINDINGS: Mediastinum and hilar structures normal. Cardiomegaly with mild
pulmonary venous congestion. No focal infiltrate. No pleural
effusion or pneumothorax.
IMPRESSION: Cardiomegaly with mild pulmonary venous congestion. No acute
pulmonary disease.

## 2020-08-20 IMAGING — CR DG HIP (WITH OR WITHOUT PELVIS) 2-3V*R*
3 series · 3 of 3 positions shown · non-contrast
Comparison: None.

CLINICAL DATA: Right hip pain

EXAM:
DG HIP (WITH OR WITHOUT PELVIS) 2-3V RIGHT

[lat]
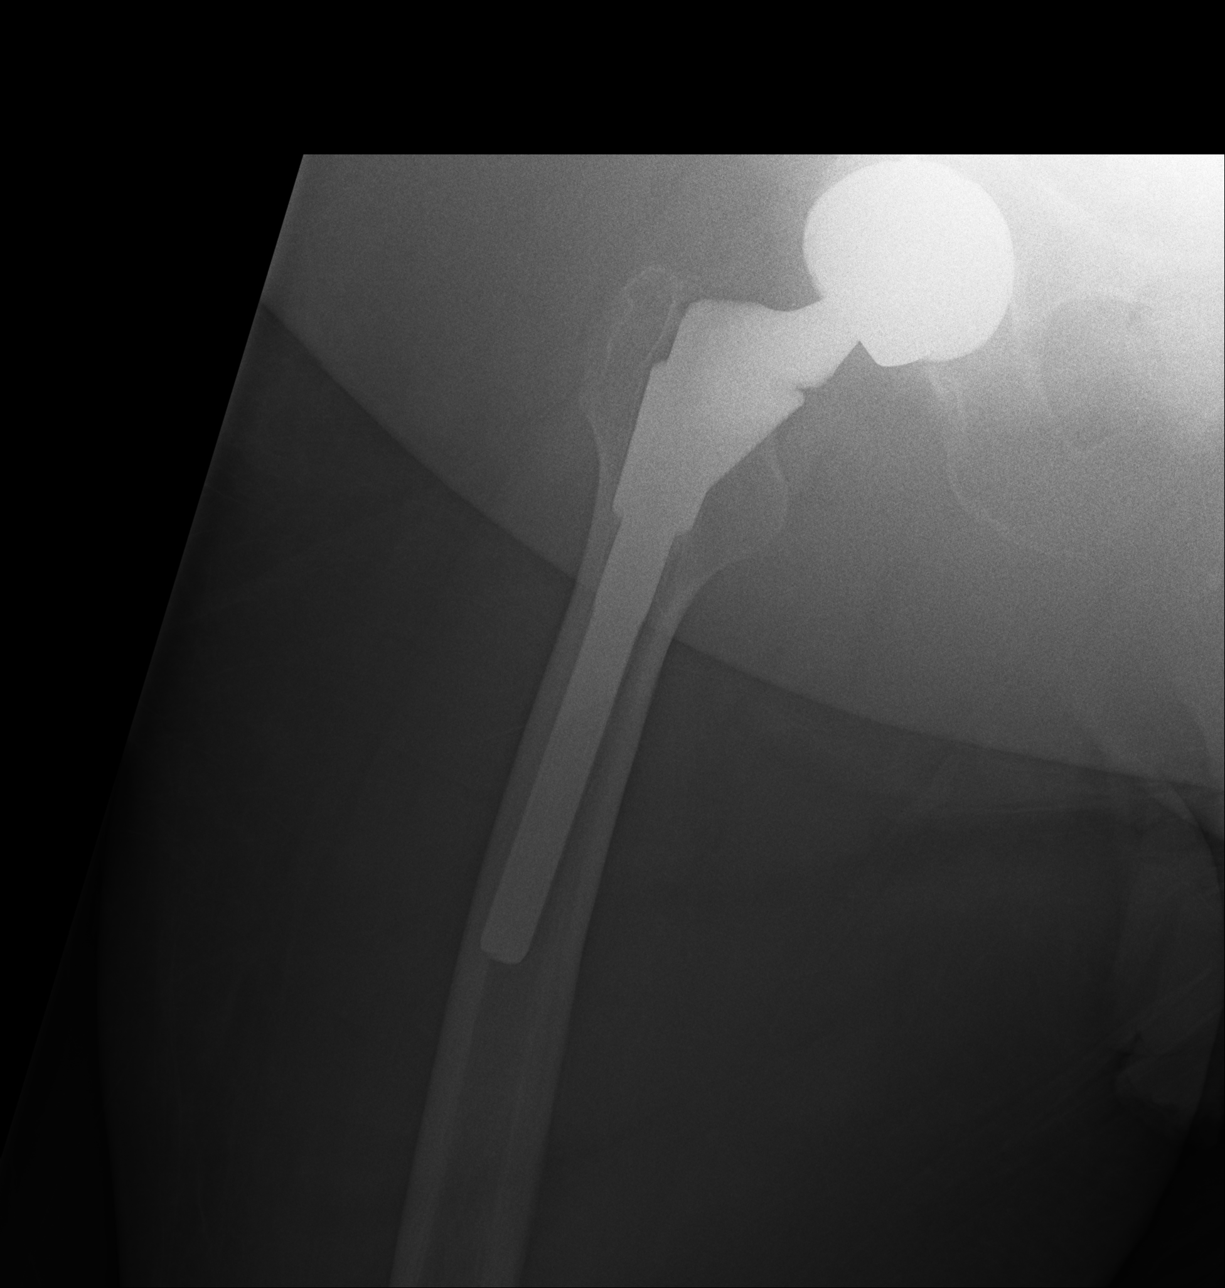

[ap (1 of 2)]
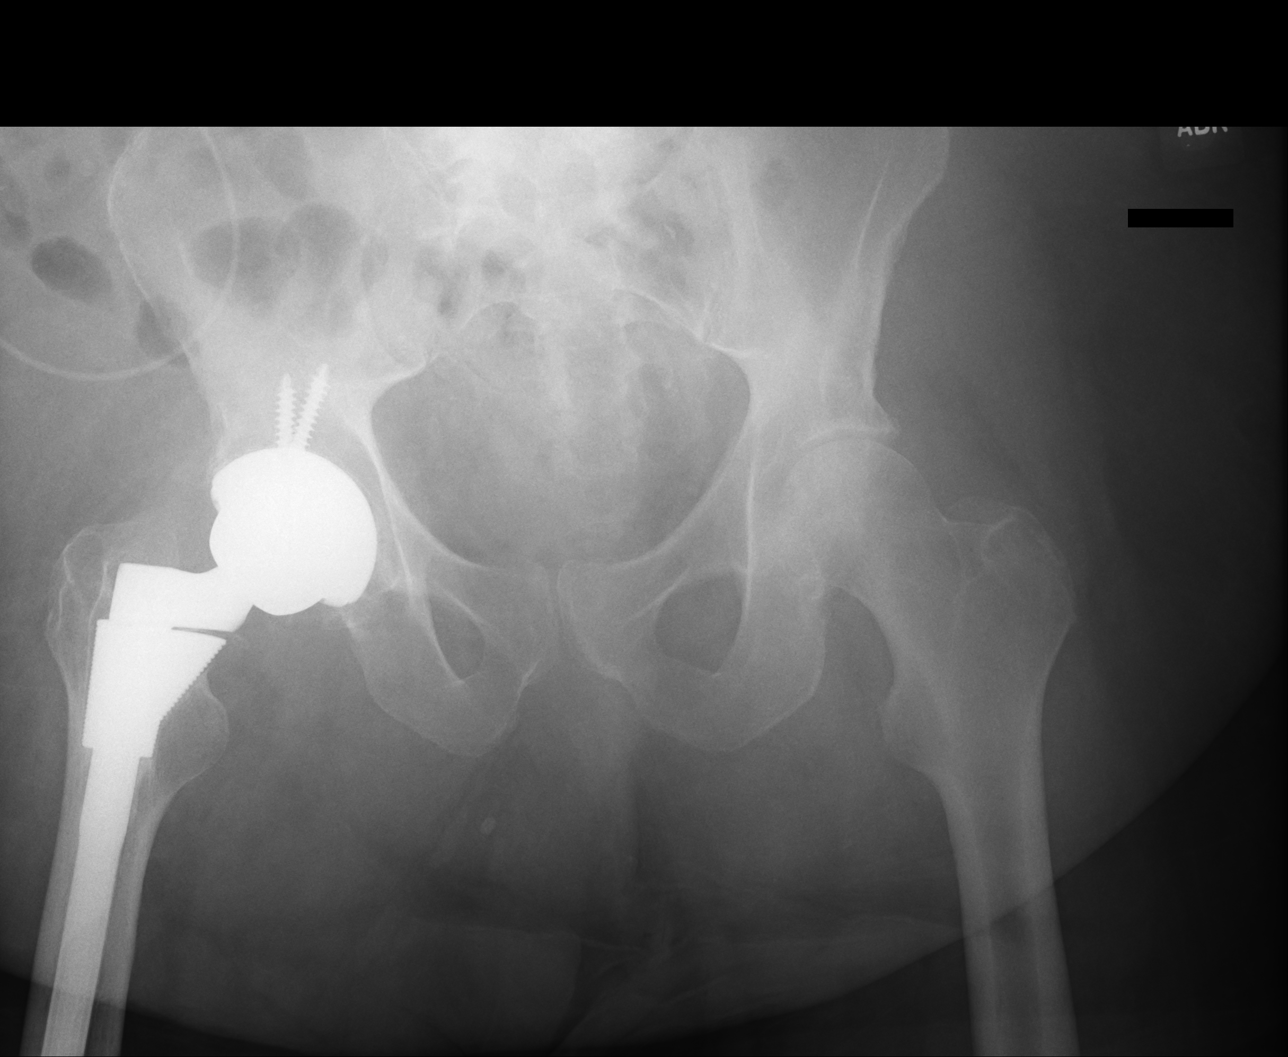

[ap (2 of 2)]
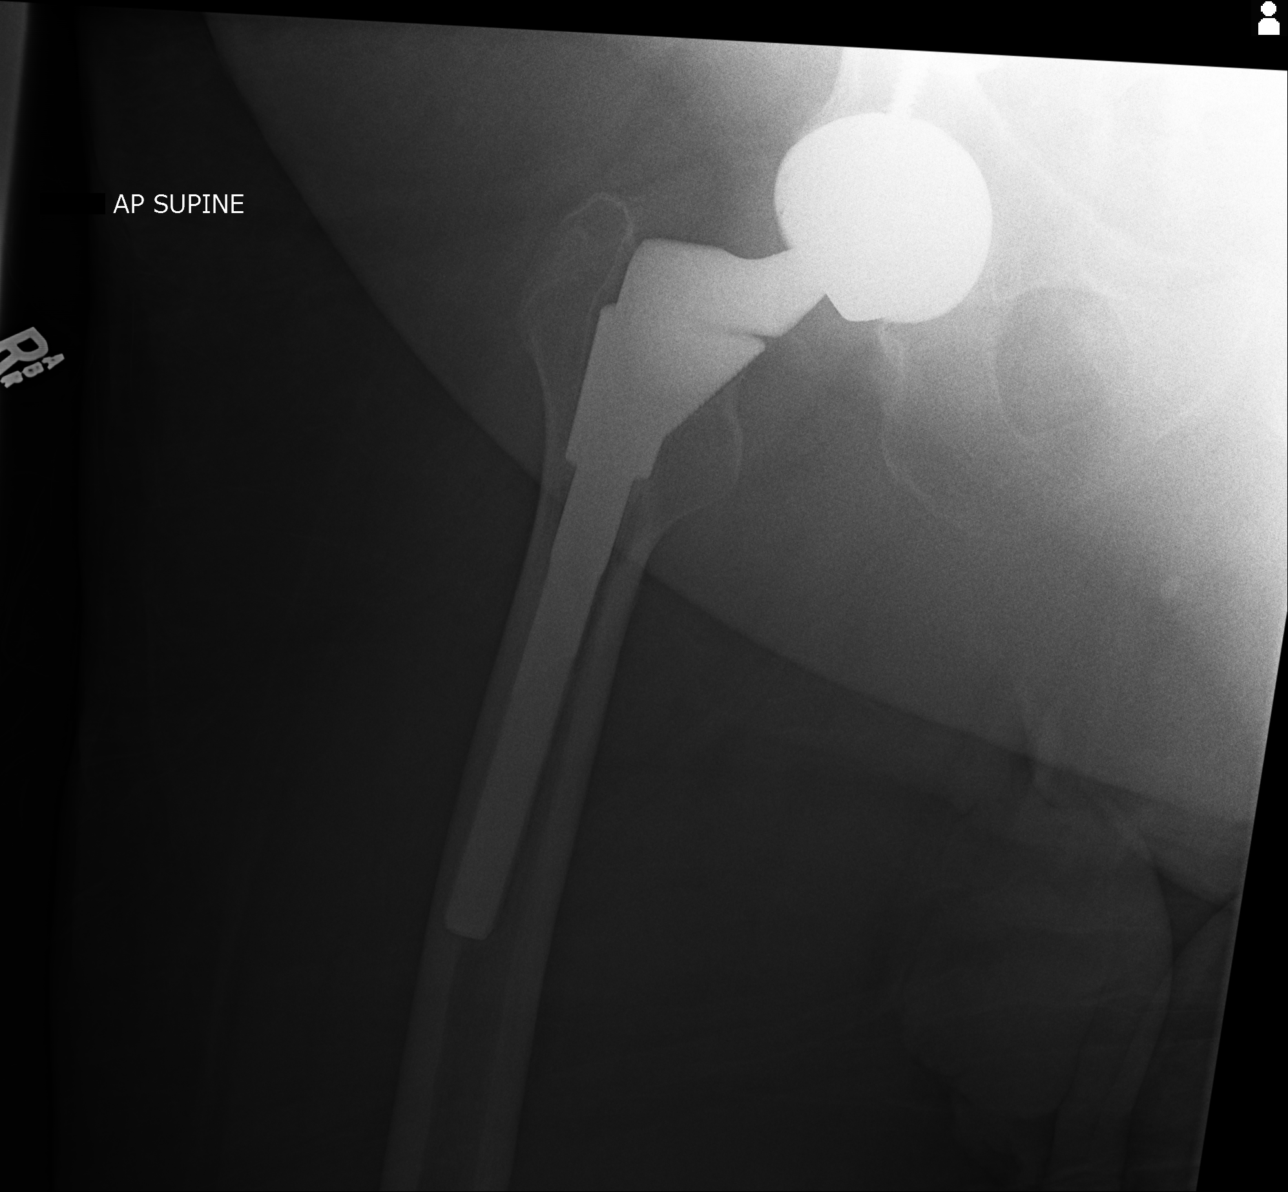

[3 of 3 positions shown; findings below may reference images not displayed]

FINDINGS: Changes of right hip replacement. No hardware complicating feature.
No acute bony abnormality. No fracture, subluxation or dislocation.
IMPRESSION: Prior right hip replacement.  No acute bony abnormality.

## 2020-08-22 IMAGING — US US ABDOMEN LIMITED
1 series · 14 of 25 positions shown · non-contrast
Comparison: CT abdomen pelvis July 11, 2018

CLINICAL DATA: Increased liver function tests.

EXAM:
ULTRASOUND ABDOMEN LIMITED RIGHT UPPER QUADRANT

[Series 1: us abdomen limited · 14 of 42 slices shown]
[im 1/42]
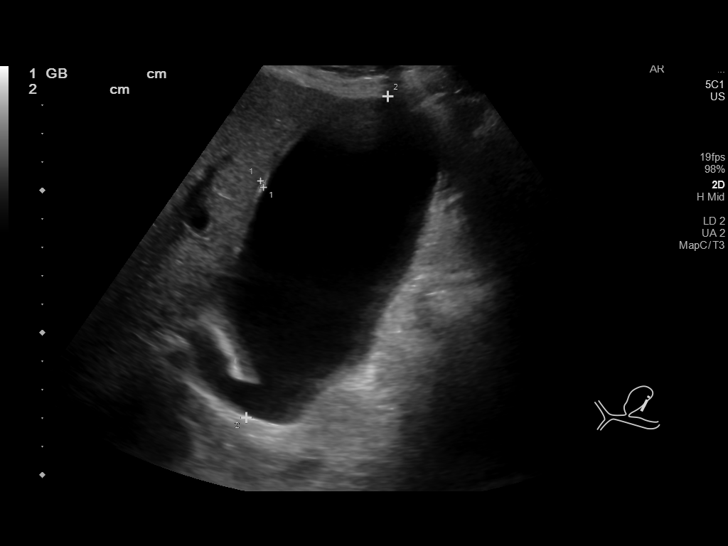
[im 4/42]
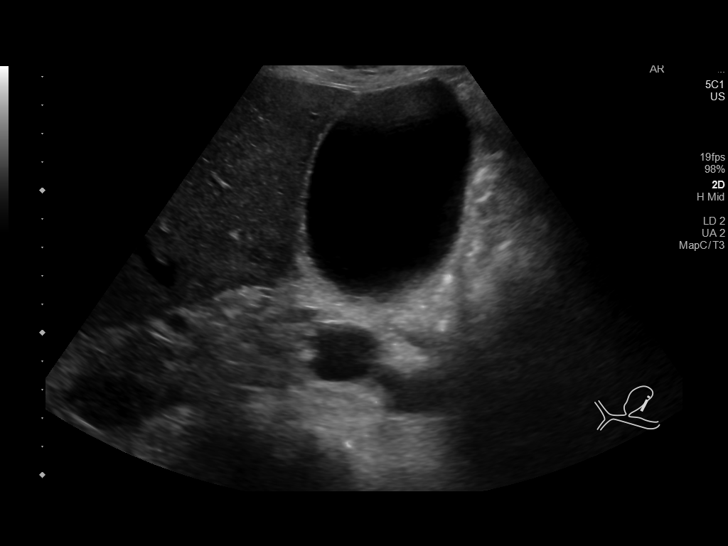
[im 7/42]
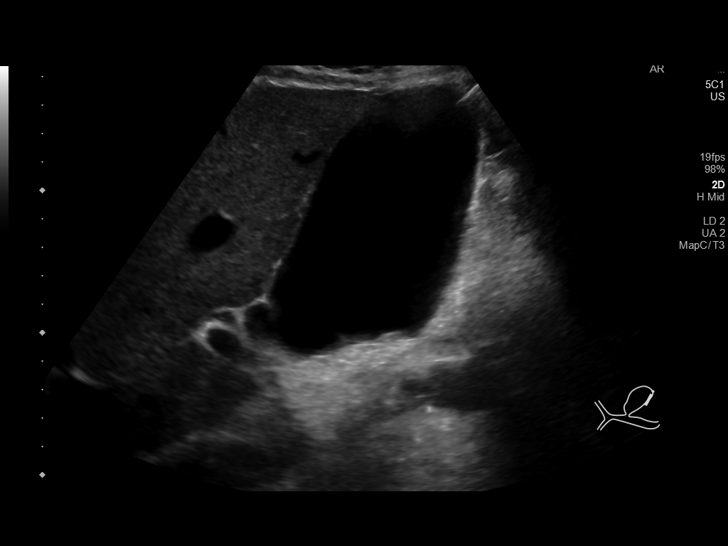
[im 11/42]
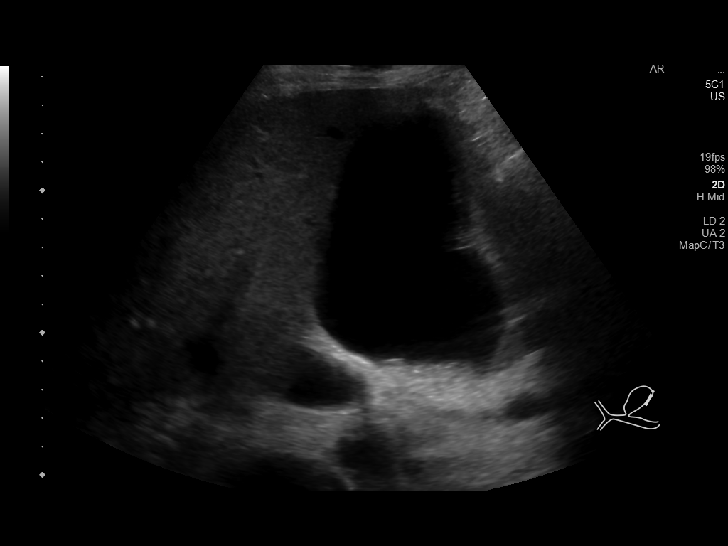
[im 14/42]
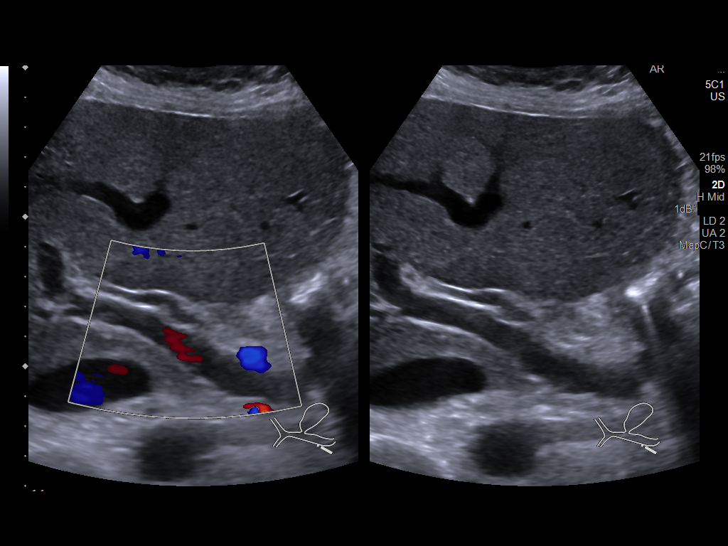
[im 16/42]
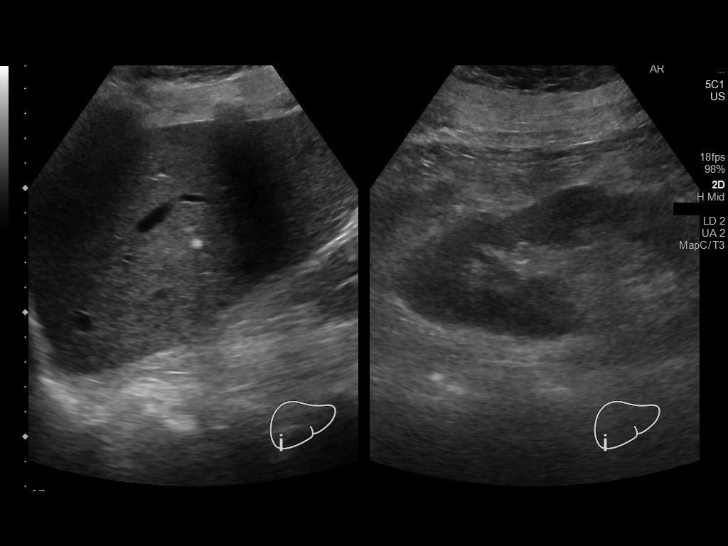
[im 19/42]
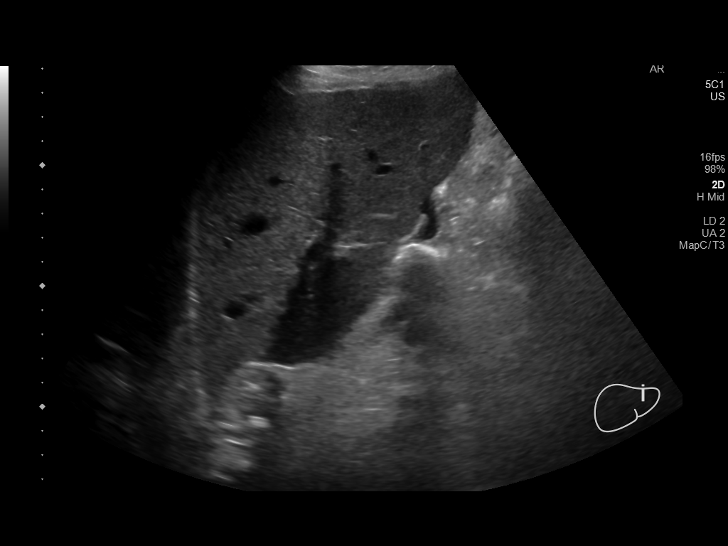
[im 23/42]
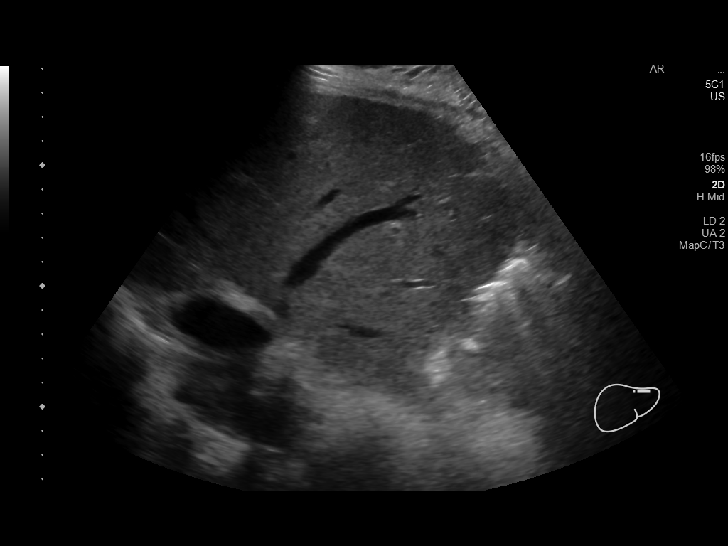
[im 26/42]
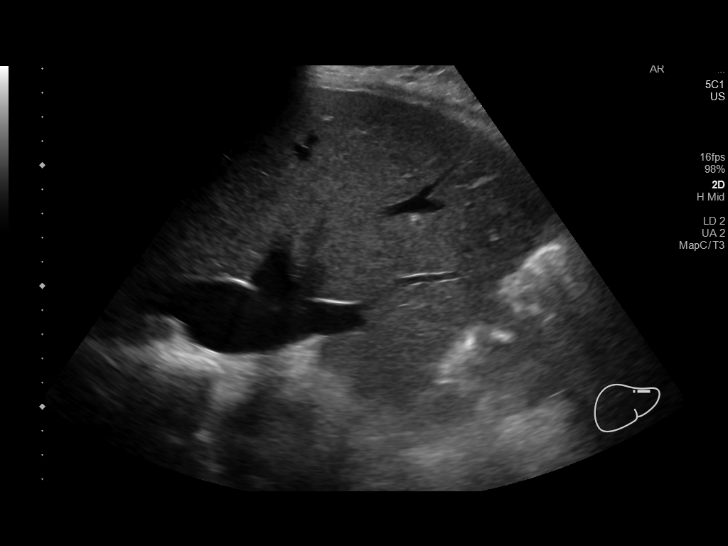
[im 28/42]
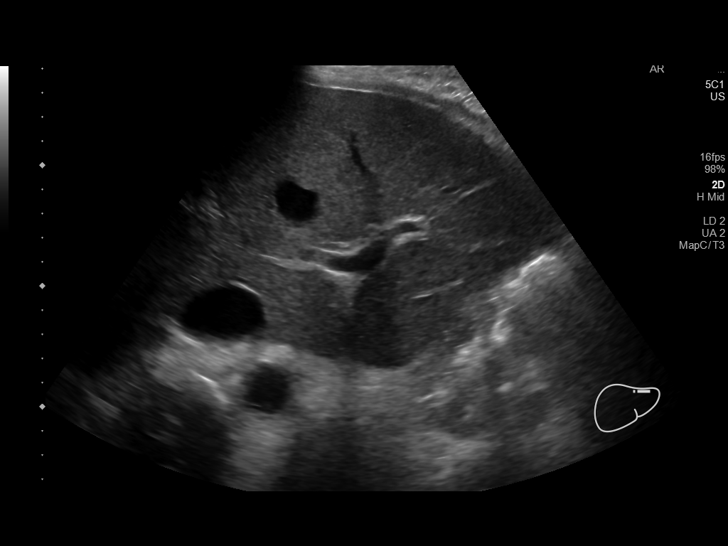
[im 31/42]
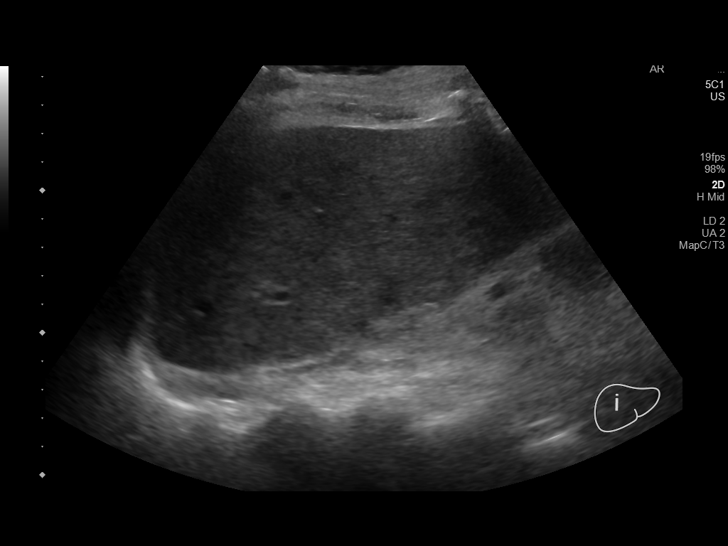
[im 35/42]
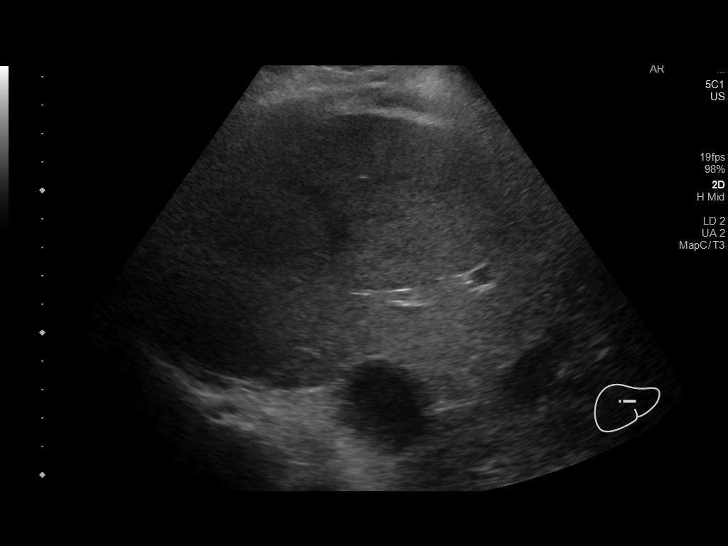
[im 38/42]
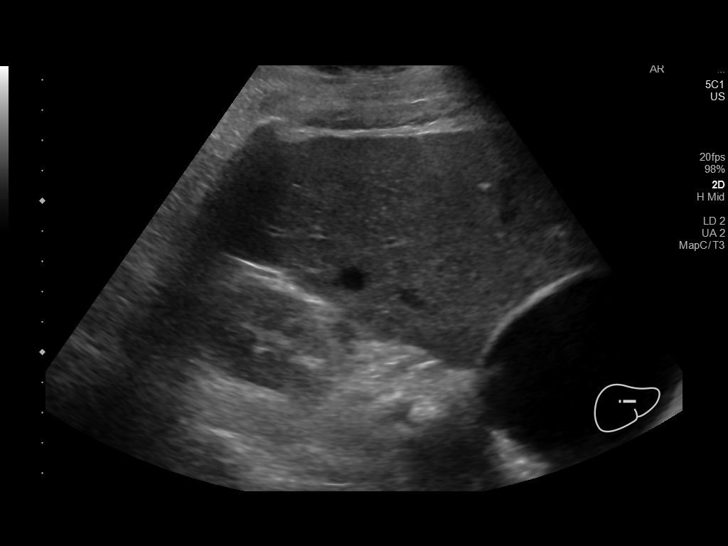
[im 42/42]
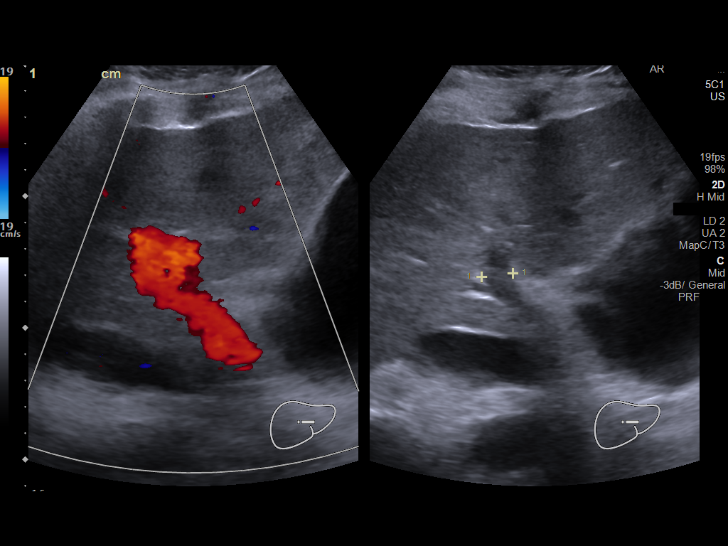

[14 of 25 positions shown; findings below may reference images not displayed]

FINDINGS: Gallbladder:

No gallstones or wall thickening visualized. The gallbladder is
distended. No sonographic Murphy sign noted by sonographer.

Common bile duct:

Diameter: 5 mm

Liver:

No focal lesion identified. Within normal limits in parenchymal
echogenicity. Nodular contour of the liver is noted. Portal vein is
patent on color Doppler imaging with normal direction of blood flow
towards the liver.
IMPRESSION: No evidence of acute cholecystitis.

Nodular contour of liver, this can be seen in cirrhosis of liver.
